# Patient Record
Sex: Female | Born: 1945 | Race: White | Hispanic: No | Smoking: Current every day smoker
Health system: Southern US, Community
[De-identification: ages and names within clinical notes are randomized; demographics above are authoritative.]

## PROBLEM LIST (undated history)

## (undated) DIAGNOSIS — F329 Major depressive disorder, single episode, unspecified: Secondary | ICD-10-CM

## (undated) DIAGNOSIS — R51 Headache: Secondary | ICD-10-CM

## (undated) DIAGNOSIS — J449 Chronic obstructive pulmonary disease, unspecified: Secondary | ICD-10-CM

## (undated) DIAGNOSIS — I959 Hypotension, unspecified: Secondary | ICD-10-CM

## (undated) DIAGNOSIS — I219 Acute myocardial infarction, unspecified: Secondary | ICD-10-CM

## (undated) DIAGNOSIS — Z7901 Long term (current) use of anticoagulants: Secondary | ICD-10-CM

## (undated) DIAGNOSIS — D649 Anemia, unspecified: Secondary | ICD-10-CM

## (undated) DIAGNOSIS — I4891 Unspecified atrial fibrillation: Principal | ICD-10-CM

## (undated) DIAGNOSIS — R519 Headache, unspecified: Secondary | ICD-10-CM

## (undated) DIAGNOSIS — M1990 Unspecified osteoarthritis, unspecified site: Secondary | ICD-10-CM

## (undated) DIAGNOSIS — N179 Acute kidney failure, unspecified: Secondary | ICD-10-CM

## (undated) DIAGNOSIS — I509 Heart failure, unspecified: Secondary | ICD-10-CM

## (undated) DIAGNOSIS — I5032 Chronic diastolic (congestive) heart failure: Secondary | ICD-10-CM

## (undated) DIAGNOSIS — E785 Hyperlipidemia, unspecified: Secondary | ICD-10-CM

## (undated) DIAGNOSIS — F32A Depression, unspecified: Secondary | ICD-10-CM

## (undated) DIAGNOSIS — G8929 Other chronic pain: Secondary | ICD-10-CM

## (undated) DIAGNOSIS — I10 Essential (primary) hypertension: Secondary | ICD-10-CM

## (undated) HISTORY — DX: Other chronic pain: G89.29

## (undated) HISTORY — PX: OTHER SURGICAL HISTORY: SHX169

## (undated) HISTORY — DX: Acute myocardial infarction, unspecified: I21.9

## (undated) HISTORY — DX: Essential (primary) hypertension: I10

## (undated) HISTORY — DX: Hyperlipidemia, unspecified: E78.5

## (undated) HISTORY — DX: Anemia, unspecified: D64.9

## (undated) HISTORY — DX: Chronic obstructive pulmonary disease, unspecified: J44.9

## (undated) HISTORY — DX: Depression, unspecified: F32.A

## (undated) HISTORY — PX: TOTAL KNEE ARTHROPLASTY: SHX125

## (undated) HISTORY — DX: Headache, unspecified: R51.9

## (undated) HISTORY — PX: JOINT REPLACEMENT: SHX530

## (undated) HISTORY — DX: Unspecified atrial fibrillation: I48.91

## (undated) HISTORY — DX: Unspecified osteoarthritis, unspecified site: M19.90

## (undated) HISTORY — PX: TONSILLECTOMY: SHX5217

## (undated) HISTORY — DX: Headache: R51

## (undated) HISTORY — DX: Major depressive disorder, single episode, unspecified: F32.9

---

## 1997-10-18 ENCOUNTER — Other Ambulatory Visit: Admission: RE | Admit: 1997-10-18 | Discharge: 1997-10-18 | Payer: Self-pay | Admitting: *Deleted

## 1997-12-04 ENCOUNTER — Ambulatory Visit (HOSPITAL_COMMUNITY): Admission: RE | Admit: 1997-12-04 | Discharge: 1997-12-04 | Payer: Self-pay | Admitting: *Deleted

## 1999-11-20 ENCOUNTER — Encounter: Payer: Self-pay | Admitting: Internal Medicine

## 1999-11-20 ENCOUNTER — Ambulatory Visit (HOSPITAL_COMMUNITY): Admission: RE | Admit: 1999-11-20 | Discharge: 1999-11-20 | Payer: Self-pay | Admitting: Internal Medicine

## 1999-11-24 ENCOUNTER — Other Ambulatory Visit: Admission: RE | Admit: 1999-11-24 | Discharge: 1999-11-24 | Payer: Self-pay | Admitting: *Deleted

## 2000-02-20 ENCOUNTER — Ambulatory Visit (HOSPITAL_COMMUNITY): Admission: RE | Admit: 2000-02-20 | Discharge: 2000-02-20 | Payer: Self-pay | Admitting: Gastroenterology

## 2000-02-20 ENCOUNTER — Encounter (INDEPENDENT_AMBULATORY_CARE_PROVIDER_SITE_OTHER): Payer: Self-pay

## 2001-12-29 ENCOUNTER — Encounter: Admission: RE | Admit: 2001-12-29 | Discharge: 2001-12-29 | Payer: Self-pay | Admitting: Internal Medicine

## 2001-12-29 ENCOUNTER — Encounter: Payer: Self-pay | Admitting: Internal Medicine

## 2003-07-07 ENCOUNTER — Encounter: Admission: RE | Admit: 2003-07-07 | Discharge: 2003-07-07 | Payer: Self-pay | Admitting: Internal Medicine

## 2003-08-13 ENCOUNTER — Encounter (INDEPENDENT_AMBULATORY_CARE_PROVIDER_SITE_OTHER): Payer: Self-pay | Admitting: Specialist

## 2003-08-13 ENCOUNTER — Ambulatory Visit (HOSPITAL_COMMUNITY): Admission: RE | Admit: 2003-08-13 | Discharge: 2003-08-13 | Payer: Self-pay | Admitting: Gastroenterology

## 2004-01-29 ENCOUNTER — Other Ambulatory Visit: Admission: RE | Admit: 2004-01-29 | Discharge: 2004-01-29 | Payer: Self-pay | Admitting: Internal Medicine

## 2004-05-01 ENCOUNTER — Inpatient Hospital Stay (HOSPITAL_COMMUNITY): Admission: RE | Admit: 2004-05-01 | Discharge: 2004-05-06 | Payer: Self-pay | Admitting: Specialist

## 2004-07-18 ENCOUNTER — Inpatient Hospital Stay (HOSPITAL_COMMUNITY): Admission: RE | Admit: 2004-07-18 | Discharge: 2004-07-22 | Payer: Self-pay | Admitting: Specialist

## 2008-10-18 ENCOUNTER — Emergency Department (HOSPITAL_COMMUNITY): Admission: EM | Admit: 2008-10-18 | Discharge: 2008-10-18 | Payer: Self-pay | Admitting: Emergency Medicine

## 2008-11-16 ENCOUNTER — Other Ambulatory Visit: Admission: RE | Admit: 2008-11-16 | Discharge: 2008-11-16 | Payer: Self-pay | Admitting: Pediatrics

## 2008-11-26 ENCOUNTER — Encounter: Payer: Self-pay | Admitting: Critical Care Medicine

## 2008-12-05 ENCOUNTER — Encounter: Payer: Self-pay | Admitting: Critical Care Medicine

## 2008-12-10 ENCOUNTER — Inpatient Hospital Stay (HOSPITAL_BASED_OUTPATIENT_CLINIC_OR_DEPARTMENT_OTHER): Admission: RE | Admit: 2008-12-10 | Discharge: 2008-12-10 | Payer: Self-pay | Admitting: Cardiology

## 2008-12-13 ENCOUNTER — Ambulatory Visit: Payer: Self-pay | Admitting: Cardiothoracic Surgery

## 2008-12-13 DIAGNOSIS — F329 Major depressive disorder, single episode, unspecified: Secondary | ICD-10-CM

## 2008-12-13 DIAGNOSIS — J441 Chronic obstructive pulmonary disease with (acute) exacerbation: Secondary | ICD-10-CM | POA: Insufficient documentation

## 2008-12-13 DIAGNOSIS — F32A Depression, unspecified: Secondary | ICD-10-CM | POA: Insufficient documentation

## 2008-12-13 DIAGNOSIS — I110 Hypertensive heart disease with heart failure: Secondary | ICD-10-CM | POA: Insufficient documentation

## 2008-12-13 DIAGNOSIS — M129 Arthropathy, unspecified: Secondary | ICD-10-CM

## 2008-12-13 HISTORY — DX: Arthropathy, unspecified: M12.9

## 2008-12-14 ENCOUNTER — Ambulatory Visit: Payer: Self-pay | Admitting: Critical Care Medicine

## 2008-12-14 DIAGNOSIS — J45909 Unspecified asthma, uncomplicated: Secondary | ICD-10-CM | POA: Insufficient documentation

## 2008-12-14 DIAGNOSIS — E785 Hyperlipidemia, unspecified: Secondary | ICD-10-CM | POA: Insufficient documentation

## 2008-12-14 DIAGNOSIS — R51 Headache: Secondary | ICD-10-CM | POA: Insufficient documentation

## 2008-12-14 DIAGNOSIS — J42 Unspecified chronic bronchitis: Secondary | ICD-10-CM | POA: Insufficient documentation

## 2008-12-14 DIAGNOSIS — I219 Acute myocardial infarction, unspecified: Secondary | ICD-10-CM | POA: Insufficient documentation

## 2008-12-14 DIAGNOSIS — R519 Headache, unspecified: Secondary | ICD-10-CM | POA: Insufficient documentation

## 2008-12-15 DIAGNOSIS — L419 Parapsoriasis, unspecified: Secondary | ICD-10-CM | POA: Insufficient documentation

## 2008-12-21 ENCOUNTER — Ambulatory Visit: Payer: Self-pay | Admitting: Critical Care Medicine

## 2008-12-24 ENCOUNTER — Telehealth: Payer: Self-pay | Admitting: Critical Care Medicine

## 2008-12-28 ENCOUNTER — Encounter: Payer: Self-pay | Admitting: Cardiothoracic Surgery

## 2008-12-28 ENCOUNTER — Ambulatory Visit: Payer: Self-pay | Admitting: Surgery

## 2008-12-28 ENCOUNTER — Telehealth: Payer: Self-pay | Admitting: Critical Care Medicine

## 2009-01-01 ENCOUNTER — Ambulatory Visit: Payer: Self-pay | Admitting: Critical Care Medicine

## 2009-01-01 ENCOUNTER — Ambulatory Visit: Payer: Self-pay | Admitting: Cardiothoracic Surgery

## 2009-01-01 ENCOUNTER — Inpatient Hospital Stay (HOSPITAL_COMMUNITY): Admission: RE | Admit: 2009-01-01 | Discharge: 2009-01-09 | Payer: Self-pay | Admitting: Cardiothoracic Surgery

## 2009-01-17 ENCOUNTER — Encounter: Payer: Self-pay | Admitting: Critical Care Medicine

## 2009-01-25 ENCOUNTER — Encounter: Payer: Self-pay | Admitting: Critical Care Medicine

## 2009-01-28 ENCOUNTER — Ambulatory Visit: Payer: Self-pay | Admitting: Critical Care Medicine

## 2009-01-28 ENCOUNTER — Encounter: Admission: RE | Admit: 2009-01-28 | Discharge: 2009-01-28 | Payer: Self-pay | Admitting: Cardiothoracic Surgery

## 2009-01-28 ENCOUNTER — Ambulatory Visit: Payer: Self-pay | Admitting: Cardiothoracic Surgery

## 2009-01-31 ENCOUNTER — Encounter: Payer: Self-pay | Admitting: Critical Care Medicine

## 2009-02-05 ENCOUNTER — Telehealth: Payer: Self-pay | Admitting: Critical Care Medicine

## 2009-02-06 ENCOUNTER — Telehealth: Payer: Self-pay | Admitting: Critical Care Medicine

## 2009-02-07 ENCOUNTER — Encounter: Payer: Self-pay | Admitting: Critical Care Medicine

## 2009-02-07 ENCOUNTER — Ambulatory Visit: Payer: Self-pay | Admitting: Thoracic Surgery (Cardiothoracic Vascular Surgery)

## 2009-02-11 ENCOUNTER — Telehealth: Payer: Self-pay | Admitting: Critical Care Medicine

## 2009-03-02 HISTORY — PX: CORONARY ARTERY BYPASS GRAFT: SHX141

## 2009-03-21 ENCOUNTER — Encounter (HOSPITAL_COMMUNITY): Admission: RE | Admit: 2009-03-21 | Discharge: 2009-06-19 | Payer: Self-pay | Admitting: Cardiology

## 2009-04-02 ENCOUNTER — Ambulatory Visit: Payer: Self-pay | Admitting: Critical Care Medicine

## 2009-04-02 DIAGNOSIS — F17200 Nicotine dependence, unspecified, uncomplicated: Secondary | ICD-10-CM | POA: Insufficient documentation

## 2009-04-04 ENCOUNTER — Encounter: Payer: Self-pay | Admitting: Critical Care Medicine

## 2009-04-18 ENCOUNTER — Encounter: Payer: Self-pay | Admitting: Critical Care Medicine

## 2009-04-22 ENCOUNTER — Telehealth: Payer: Self-pay | Admitting: Critical Care Medicine

## 2009-06-07 ENCOUNTER — Encounter: Payer: Self-pay | Admitting: Critical Care Medicine

## 2009-11-27 ENCOUNTER — Inpatient Hospital Stay (HOSPITAL_COMMUNITY): Admission: EM | Admit: 2009-11-27 | Discharge: 2009-12-01 | Payer: Self-pay | Admitting: Emergency Medicine

## 2009-11-28 ENCOUNTER — Encounter (INDEPENDENT_AMBULATORY_CARE_PROVIDER_SITE_OTHER): Payer: Self-pay | Admitting: Emergency Medicine

## 2009-11-30 DIAGNOSIS — I4891 Unspecified atrial fibrillation: Secondary | ICD-10-CM

## 2009-11-30 HISTORY — DX: Unspecified atrial fibrillation: I48.91

## 2010-04-03 NOTE — Miscellaneous (Signed)
Summary: [facility] Plan/[facility]  Care Plan/[facility]   Imported By: Lester [place] 02/04/2009 07:41:12  _____________________________________________________________________  External Attachment:    Type:   Image     Comment:   External Document

## 2010-04-03 NOTE — Medication Information (Signed)
Summary: [facility]   Imported By: Lester [place] 04/29/2009 08:45:38  _____________________________________________________________________  External Attachment:    Type:   Image     Comment:   External Document

## 2010-04-03 NOTE — Miscellaneous (Signed)
Summary: [facility]  [facility]   Imported By: Sherian Rein 04/05/2009 09:11:11  _____________________________________________________________________  External Attachment:    Type:   Image     Comment:   External Document

## 2010-04-03 NOTE — Progress Notes (Signed)
Summary: Cleared for CABG  ---- Converted from flag ---- ---- 12/22/2008 6:17 AM, Storm Frisk MD wrote: pls call Dr Ysidro Evert office [phone] and tell them the pt is cleared for CABG at any time.  pw ------------------------------  Phone Note Outgoing Call   Call placed by: Gweneth Dimitri RN,  December 24, 2008 9:57 AM Call placed to: Dr. Tyrone Sage Summary of Call: called Dr. Dennie Maizes office.  Spoke with Sherre Poot, Dr. Dennie Maizes nurse.  informed her to please inform Dr. Tyrone Sage that pt is cleared for CABG at any time per PW.  She verbalized understanding and stated she would get message to Dr. Rayburn Ma.   Initial call taken by: Gweneth Dimitri RN,  December 24, 2008 9:59 AM

## 2010-04-03 NOTE — Progress Notes (Signed)
Summary: fyi  Phone Note Call from Patient Call back at [phone]   Caller: Patient Call For: Jennett Tarbell Summary of Call: pt having by pass surgery on tuesday Initial call taken by: Rickard Patience,  December 28, 2008 1:56 PM  Follow-up for Phone Call        noted   Follow-up by: Storm Frisk MD,  December 28, 2008 2:55 PM

## 2010-04-03 NOTE — Assessment & Plan Note (Signed)
Summary: Pulmonary Consultation    Copy to:  Dr. Sheliah Plane Primary Provider/Referring Provider:  Dr. Benjamine Mola  CC:  Pulmonary Consult.  c/o cough with white mucus x20yrs.   and COPD initial evaluation.  History of Present Illness: Pulmonary Consultatio      This is a 65 year old woman who presents for COPD initial evaluation.  The patient complains of history of diagnosed COPD, shortness of breath, wheezing, cough, mucous production, and exercise induced symptoms, but denies chest tightness, chest pain worse with breathing and coughing, nocturnal awakening, and congestion.  Prior evaluation and testing has included Cxr.  The dyspnea is described as with ADL's, with slow ADL's, with walking within room, and with walking one or two blocks.  Associated disease(s) include(s) coronary artery disease, indigestion, hoarseness, anxiety, productive cough, non-productive cough, chest pain, and chest tightness.  Oxygen evaluation is described as not on supplemental O2.  Prior effective treatment has included short acting beta agonists and oral corticosteroids.     This pt is referred for preop eval for CABG.  Pt with lifelong bronchitis.  Active smoking 1PPD x 4yrs but did stop smoking this week.  Had PNA spring 2010.  Noting more dyspnea with smoking worse over two years.  Ave bronchitis once yearly.  If gets upset will cough.  Was on advair but ran out as had no insurance coverage for same.  Aves twice a day on proventil hfa.   No recent prednisone but this helps.  Zpak helps.  Now mucous is white.  Notes some wheezing.   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  Pt with ongoing bleeding from the lesions.  Pt with appt with [facility] 10/15 for eval.  Preventive Screening-Counseling & Management  Alcohol-Tobacco     Smoking Status: current     Packs/Day: 1.0     Pack years: 45  Current Medications (verified): 1)  Metoprolol Tartrate 50 Mg Tabs  (Metoprolol Tartrate) .... Once Daily 2)  Aspirin 81 Mg Tbec (Aspirin) .... Once Daily 3)  Alprazolam 0.25 Mg Tabs (Alprazolam) .Marland Kitchen.. 1 Tab Every 8 Hours As Needed 4)  Simvastatin 40 Mg Tabs (Simvastatin) .... Once Daily 5)  Paroxetine Hcl 20 Mg Tabs (Paroxetine Hcl) .... Two Times A Day 6)  Lisinopril-Hydrochlorothiazide 10-12.5 Mg Tabs (Lisinopril-Hydrochlorothiazide) .... Two Times A Day 7)  Proventil Hfa 108 (90 Base) Mcg/act Aers (Albuterol Sulfate) .Marland Kitchen.. 1-2 Puffs Every 4 Hours As Needed  Allergies: 1)  ! Pcn 2)  ! * Novacaine  Past History:  Past medical, surgical, family and social histories (including risk factors) reviewed, and no changes noted (except as noted below).  Past Medical History: Current Problems:  HEADACHE, CHRONIC (ICD-784.0) HYPERLIPIDEMIA (ICD-272.4) ASTHMA (ICD-493.90) BRONCHITIS, CHRONIC (ICD-491.9) HEART ATTACK (ICD-410.90) ARTHRITIS (ICD-716.90) HYPERTENSION (ICD-401.9) DEPRESSION (ICD-311) C O P D (ICD-496)  Past Surgical History: C-section x2 Total Knee Arthroplasty  Family History: Reviewed history and no changes required. heart disease-father cancer-lung ca MGF and PGF  Social History: Reviewed history and no changes required. Patient is a current smoker.   1ppd x 29yrs 2 children Retired from Clinical biochemist at [employer] Smoking Status:  current Packs/Day:  1.0 Pack years:  45  Review of Systems       The patient complains of shortness of breath with activity, shortness of breath at rest, productive cough, chest pain, irregular heartbeats, acid heartburn, indigestion, loss of appetite, weight change, difficulty swallowing, headaches, nasal congestion/difficulty breathing through  nose, sneezing, itching, anxiety, depression, joint stiffness or pain, and rash.  The patient denies non-productive cough, coughing up blood, sore throat, tooth/dental problems, ear ache, hand/feet swelling, change in color of mucus, and fever.        See HPI for  Pulmonary, Cardiac, General, and ENT review of systems.  Vital Signs:  Patient profile:   65 year old female Height:      63 inches Weight:      207.50 pounds BMI:     36.89 O2 Sat:      92 % on Room air Temp:     97.9 degrees F oral Pulse rate:   55 / minute BP sitting:   110 / 68  (left arm) Cuff size:   regular  Vitals Entered By: Gweneth Dimitri RN (December 14, 2008 1:38 PM)  O2 Flow:  Room air CC: Pulmonary Consult.  c/o cough with white mucus x22yrs.  , COPD initial evaluation Comments Medications reviewed with patient Gweneth Dimitri RN  December 14, 2008 1:39 PM    Physical Exam  Additional Exam:  Gen: Pleasant, well-nourished, in no distress,  normal affect ENT: No lesions,  mouth clear,  oropharynx clear, no postnasal drip Neck: No JVD, no TMG, no carotid bruits Lungs: No use of accessory muscles, no dullness to percussion, distant BS, exp wheezes. poor airflow Cardiovascular: RRR, heart sounds normal, no murmur or gallops, no peripheral edema Abdomen: soft and NT, no HSM,  BS normal Musculoskeletal: No deformities, no cyanosis or clubbing Neuro: alert, non focal Skin: Warm, innumberable raised psorarisis type rash on arms and legs, many are bleeding due to constant scratching per pt   CXR  Procedure date:  12/13/2008  Findings:      COPD changes   Pulmonary Function Test Date: 12/14/2008 2:00 PM Gender: Female  Pre-Spirometry FVC    Value: 1.56 L/min   % Pred: 51.30 % FEV1    Value: 1.27 L     Pred: 2.32 L     % Pred: 54.60 % FEV1/FVC  Value: 81.24 %     % Pred: 105.50 %  Impression & Recommendations:  Problem # 1:  C O P D (ICD-496) Assessment Unchanged Severe Copd with obstruction on spirometry in moderate to severe degree.  Pt is not yet ready for CABG if can be postponed. Also skin lesions need further treatment to heal up prior to surgery plan Depomedrol 120mg  IM will be given Avelox one daily for 5 days Pulse prednisone  Start Symbicort two  puff twice daily Start Spiriva Return one week Would hold off on surgery until return visit and clearance>>delay surgery by at least two weeks if possible to allow airways and skin to heal first to lower surgical risk  Problem # 2:  PARAPSORIASIS (ICD-696.2) Assessment: Unchanged Psoriasis type rash over legs and arms ?etiology plan case discussed with Donzetta Starch today of Derm  He will see the patient and advise proper rx systemic steroids for lungs will help  Medications Added to Medication List This Visit: 1)  Metoprolol Tartrate 50 Mg Tabs (Metoprolol tartrate) .... Once daily 2)  Aspirin 81 Mg Tbec (Aspirin) .... Once daily 3)  Alprazolam 0.25 Mg Tabs (Alprazolam) .Marland Kitchen.. 1 tab every 8 hours as needed 4)  Simvastatin 40 Mg Tabs (Simvastatin) .... Once daily 5)  Paroxetine Hcl 20 Mg Tabs (Paroxetine hcl) .... Two times a day 6)  Lisinopril-hydrochlorothiazide 10-12.5 Mg Tabs (Lisinopril-hydrochlorothiazide) .... Two times a day 7)  Proventil Hfa 108 (90  Base) Mcg/act Aers (Albuterol sulfate) .Marland Kitchen.. 1-2 puffs every 4 hours as needed 8)  Spiriva Handihaler 18 Mcg Caps (Tiotropium bromide monohydrate) .... Two puffs in handihaler daily 9)  Symbicort 160-4.5 Mcg/act Aero (Budesonide-formoterol fumarate) .... Two puffs twice daily 10)  Prednisone 10 Mg Tabs (Prednisone) .... Take as directed 4 each am x3days, 3 x 3days, 2 x 3days, 1 x 3days then stop 11)  Avelox 400 Mg Tabs (Moxifloxacin hcl) .... By mouth daily  Complete Medication List: 1)  Metoprolol Tartrate 50 Mg Tabs (Metoprolol tartrate) .... Once daily 2)  Aspirin 81 Mg Tbec (Aspirin) .... Once daily 3)  Alprazolam 0.25 Mg Tabs (Alprazolam) .Marland Kitchen.. 1 tab every 8 hours as needed 4)  Simvastatin 40 Mg Tabs (Simvastatin) .... Once daily 5)  Paroxetine Hcl 20 Mg Tabs (Paroxetine hcl) .... Two times a day 6)  Lisinopril-hydrochlorothiazide 10-12.5 Mg Tabs (Lisinopril-hydrochlorothiazide) .... Two times a day 7)  Proventil Hfa 108 (90 Base)  Mcg/act Aers (Albuterol sulfate) .Marland Kitchen.. 1-2 puffs every 4 hours as needed 8)  Spiriva Handihaler 18 Mcg Caps (Tiotropium bromide monohydrate) .... Two puffs in handihaler daily 9)  Symbicort 160-4.5 Mcg/act Aero (Budesonide-formoterol fumarate) .... Two puffs twice daily 10)  Prednisone 10 Mg Tabs (Prednisone) .... Take as directed 4 each am x3days, 3 x 3days, 2 x 3days, 1 x 3days then stop 11)  Avelox 400 Mg Tabs (Moxifloxacin hcl) .... By mouth daily  Other Orders: Spirometry w/Graph (94010) New Patient Level V (29562) Admin of Therapeutic Inj  intramuscular or subcutaneous (13086) Depo- Medrol 40mg  (J1030) Depo- Medrol 80mg  (J1040)  Patient Instructions: 1)  Depomedrol 120mg  IM will be given 2)  Avelox one daily for 5 days 3)  Pulse prednisone  4)  Start Symbicort two puff twice daily 5)  Start Spiriva 6)  Return one week 7)  Would hold off on surgery until return visit and clearance 8)  Keep dermatology visit Prescriptions: PREDNISONE 10 MG  TABS (PREDNISONE) Take as directed 4 each am x3days, 3 x 3days, 2 x 3days, 1 x 3days then stop  #30 x 0   Entered and Authorized by:   Storm Frisk MD   Signed by:   Storm Frisk MD on 12/14/2008   Method used:   Print then Give to Patient   RxID:   [id number] SYMBICORT 160-4.5 MCG/ACT  AERO (BUDESONIDE-FORMOTEROL FUMARATE) Two puffs twice daily  #1 x 6   Entered and Authorized by:   Storm Frisk MD   Signed by:   Storm Frisk MD on 12/14/2008   Method used:   Print then Give to Patient   RxID:   [id number] SPIRIVA HANDIHALER 18 MCG  CAPS (TIOTROPIUM BROMIDE MONOHYDRATE) Two puffs in handihaler daily  #30 x 6   Entered and Authorized by:   Storm Frisk MD   Signed by:   Storm Frisk MD on 12/14/2008   Method used:   Print then Give to Patient   RxID:   [id number] PREDNISONE 10 MG  TABS (PREDNISONE) Take as directed 4 each am x3days, 3 x 3days, 2 x 3days, 1 x 3days then stop  #30 x 0   Entered and  Authorized by:   Storm Frisk MD   Signed by:   Storm Frisk MD on 12/14/2008   Method used:   Print then Give to Patient   RxID:   [id number] SYMBICORT 160-4.5 MCG/ACT  AERO (BUDESONIDE-FORMOTEROL FUMARATE) Two puffs twice daily  #1 x 6  Entered and Authorized by:   Storm Frisk MD   Signed by:   Storm Frisk MD on 12/14/2008   Method used:   Print then Give to Patient   RxID:   [id number] SPIRIVA HANDIHALER 18 MCG  CAPS (TIOTROPIUM BROMIDE MONOHYDRATE) Two puffs in handihaler daily  #30 x 6   Entered and Authorized by:   Storm Frisk MD   Signed by:   Storm Frisk MD on 12/14/2008   Method used:   Print then Give to Patient   RxID:   [id number]    CardioPerfect Spirometry  ID: [id number] Patient: Leonard Schwartz DOB: 01-28-1946 Age: 65 Years Old Sex: Female Race: White Physician: Deontae Robson Height: 63 Weight: 207.50 Smoker: Yes PPD: 1.0 Status: Confirmed Past Medical History:  Current Problems:  ARTHRITIS (ICD-716.90) HYPERTENSION (ICD-401.9) DEPRESSION (ICD-311) 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 P D (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)  Recorded: 12/14/2008 2:00 PM  Parameter  Measured Predicted %Predicted FVC     1.56        3.04        51.30 FEV1     1.27        2.32        54.60 FEV1%   81.24        77.00        105.50 PEF    3.02        5.84        51.70   Comments: Moderate airflow obstruction   Interpretation: Pre: FVC= 1.56L FEV1= 1.27L FEV1%= 81.2% 1.27/1.56 FEV1/FVC (12/14/2008 2:01:53 PM), Moderately severe restriction     Medication Administration  Injection # 1:    Medication: Depo- Medrol 80mg     Diagnosis: BRONCHITIS, CHRONIC (ICD-491.9)    Route: IM    Site: R deltoid    Exp Date: 12/20/2010    Lot #: [id number]    Mfr: [employer]    Patient tolerated injection without complications    Given by: Clarise Cruz (AAMA) (December 14, 2008 3:15 PM)  Injection # 2:    Medication: Depo- Medrol 40mg     Diagnosis: BRONCHITIS, CHRONIC (ICD-491.9)    Route:  IM    Site: R deltoid    Exp Date: 12/20/2010    Lot #: [id number]    Mfr: [employer]    Patient tolerated injection without complications    Given by: Clarise Cruz Duncan Dull) (December 14, 2008 3:17 PM)  Orders Added: 1)  Spirometry w/Graph [94010] 2)  New Patient Level V [99205] 3)  Admin of Therapeutic Inj  intramuscular or subcutaneous [96372] 4)  Depo- Medrol 40mg  [J1030] 5)  Depo- Medrol 80mg  [J1040]  Appended Document: Pulmonary Consultation  fax Donato Schultz, Saralyn Pilar,

## 2010-04-03 NOTE — Progress Notes (Signed)
Summary: order request  Phone Note Call from Patient   Caller: Patient Call For: Ellouise Mcwhirter Summary of Call: needs [facility] to d/c oxygen. says dr Delford Field told pt she didn't need this any more. pt [phone] Initial call taken by: Tivis Ringer,  February 11, 2009 9:52 AM  Follow-up for Phone Call        order has been placed by PW and faxed to [facility]. Pt states [facility] says they have not received fax. I have refaxed the order. Pt  aware.Carron Curie CMA  February 11, 2009 9:56 AM

## 2010-04-03 NOTE — Letter (Signed)
Summary: CMN for walker w/ wheels/[facility]  CMN for walker w/ wheels/[facility]   Imported By: Sherian Rein 01/29/2009 14:21:04  _____________________________________________________________________  External Attachment:    Type:   Image     Comment:   External Document

## 2010-04-03 NOTE — Miscellaneous (Signed)
Summary: PT Orders/[facility]  PT Orders/[facility]   Imported By: Lanelle Bal 06/14/2009 10:54:09  _____________________________________________________________________  External Attachment:    Type:   Image     Comment:   External Document

## 2010-04-03 NOTE — Miscellaneous (Signed)
Summary: Orders Update  Clinical Lists Changes  Orders: Added new Referral order of DME Referral (DME) - Signed 

## 2010-04-03 NOTE — Assessment & Plan Note (Signed)
Summary: Pulmonary OV   Copy to:  Dr. Sheliah Plane Primary Provider/Referring Provider:  Dr. Benjamine Mola  CC:  1 wk follow up.  pt states breathing is a lot better and no longer having coughing spells.  pt started spiriva/symbicort-they are helping.  Marland Kitchen  History of Present Illness: Pulmonary OV    12/14/08:   This is a 65 year old woman who presents for COPD initial evaluation.   This pt is referred for preop eval for CABG.  Pt with lifelong bronchitis.  Active smoking 1PPD x 77yrs but did stop smoking this week.  Had PNA spring 2010.  Noting more dyspnea with smoking worse over two years.  Ave bronchitis once yearly.  If gets upset will cough.  Was on advair but ran out as had no insurance coverage for same.  Aves twice a day on proventil hfa.   No recent prednisone but this helps.  Zpak helps.  Now mucous is white.  Notes some wheezing.   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  Pt with ongoing bleeding from the lesions.  Pt with appt with [facility] 10/15 for eval.  December 21, 2008 10:35 AM The pt is doing better.  Her skin dx is psoriasis.   The pt is on steroids and pulm ABX covered skin infection.  She is less dyspneic.  There is less cough.  There is no cp.  She is not smoking. Pt denies any significant sore throat, nasal congestion or excess secretions, fever, chills, sweats, unintended weight loss, pleurtic or exertional chest pain, orthopnea PND, or leg swelling Pt denies any increase in rescue therapy over baseline, denies waking up needing it or having any early am or nocturnal exacerbations of coughing/wheezing/or dyspnea.   Preventive Screening-Counseling & Management  Alcohol-Tobacco     Smoking Status: quit < 6 months  Current Medications (verified): 1)  Metoprolol Tartrate 50 Mg Tabs (Metoprolol Tartrate) .... Once Daily 2)  Aspirin 81 Mg Tbec (Aspirin) .... Once Daily 3)  Alprazolam 0.25 Mg Tabs (Alprazolam) .Marland Kitchen.. 1 Tab Every 8  Hours As Needed 4)  Simvastatin 40 Mg Tabs (Simvastatin) .... Once Daily 5)  Paroxetine Hcl 20 Mg Tabs (Paroxetine Hcl) .... Two Times A Day 6)  Lisinopril-Hydrochlorothiazide 10-12.5 Mg Tabs (Lisinopril-Hydrochlorothiazide) .... Two Times A Day 7)  Proventil Hfa 108 (90 Base) Mcg/act Aers (Albuterol Sulfate) .Marland Kitchen.. 1-2 Puffs Every 4 Hours As Needed 8)  Spiriva Handihaler 18 Mcg  Caps (Tiotropium Bromide Monohydrate) .... Two Puffs in Handihaler Daily 9)  Symbicort 160-4.5 Mcg/act  Aero (Budesonide-Formoterol Fumarate) .... Two Puffs Twice Daily 10)  Prednisone 10 Mg  Tabs (Prednisone) .... Take As Directed 4 Each Am X3days, 3 X 3days, 2 X 3days, 1 X 3days Then Stop  Allergies (verified): 1)  ! Pcn 2)  ! * Novacaine  Past History:  Past medical, surgical, family and social histories (including risk factors) reviewed, and no changes noted (except as noted below).  Past Medical History: Reviewed history from 12/14/2008 and no changes required. 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 C-section x2 Total Knee Arthroplasty  Family History: Reviewed history from 12/14/2008 and no changes required. heart disease-father cancer-lung ca MGF and PGF  Social History: Reviewed history from 12/14/2008 and no changes required. Patient  is a current smoker.   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  The patient denies shortness of breath at rest, productive cough, non-productive cough, coughing up blood, chest pain, irregular heartbeats, acid heartburn, indigestion, loss  of appetite, weight change, abdominal pain, difficulty swallowing, sore throat, tooth/dental problems, headaches, nasal congestion/difficulty breathing through nose, sneezing, itching, ear ache, anxiety, depression, hand/feet swelling, joint stiffness or pain, rash, change in color of mucus, and fever.    Vital Signs:  Patient profile:   65 year old female Height:      63 inches Weight:      212 pounds BMI:     37.69 O2 Sat:      96 % on Room air Temp:     98.0 degrees F oral Pulse rate:   57 / minute BP sitting:   108 / 80  (left arm) Cuff size:   regular  Vitals Entered By: Gweneth Dimitri RN (December 21, 2008 10:24 AM)  O2 Flow:  Room air CC: 1 wk follow up.  pt states breathing is a lot better and no longer having coughing spells.  pt started spiriva/symbicort-they are helping.   Comments Medications reviewed with patient Gweneth Dimitri RN  December 21, 2008 10:25 AM    Physical Exam  Additional Exam:  Gen: Pleasant, well-nourished, in no distress,  normal affect ENT: No lesions,  mouth clear,  oropharynx clear, no postnasal drip Neck: No JVD, no TMG, no carotid bruits Lungs: No use of accessory muscles, no dullness to percussion, improved airflow Cardiovascular: RRR, heart sounds normal, no murmur or gallops, no peripheral edema Abdomen: soft and NT, no HSM,  BS normal Musculoskeletal: No deformities, no cyanosis or clubbing Neuro: alert, non focal Skin: Warm,  improved psoriatic skin lesions over arms and legs.   Impression & Recommendations:  Problem # 1:  PARAPSORIASIS (EAV-409.8) Assessment Improved Psoriasis improved plan  per Derm  Problem # 2:  C O P D (ICD-496) Assessment: Improved COPD with improved airway inflammation plan taper steroids to off no further ABX  Complete Medication List: 1)  Metoprolol Tartrate 50 Mg Tabs (Metoprolol tartrate) .... Once daily 2)  Aspirin 81 Mg Tbec (Aspirin) .... Once daily 3)  Alprazolam 0.25 Mg Tabs (Alprazolam) .Marland Kitchen.. 1  tab every 8 hours as needed 4)  Simvastatin 40 Mg Tabs (Simvastatin) .... Once daily 5)  Paroxetine Hcl 20 Mg Tabs (Paroxetine hcl) .... Two times a day 6)  Lisinopril-hydrochlorothiazide 10-12.5 Mg Tabs (Lisinopril-hydrochlorothiazide) .... Two times a day 7)  Proventil Hfa 108 (90 Base) Mcg/act Aers (Albuterol sulfate) .Marland Kitchen.. 1-2 puffs every 4 hours as needed 8)  Spiriva Handihaler 18 Mcg Caps (Tiotropium bromide monohydrate) .... Two puffs in handihaler daily 9)  Symbicort 160-4.5 Mcg/act Aero (Budesonide-formoterol fumarate) .... Two puffs twice daily 10)  Prednisone 10 Mg Tabs (Prednisone) .... Take as directed 4 each am x3days, 3 x 3days, 2 x 3days, 1 x 3days then stop  Other Orders: Est. Patient Level III (11914)  Patient Instructions: 1)  Finish prednisone  2)  Stay off cigarettes 3)  You are cleared for surgery 4)  Stay on inhalers and heart meds 5)  Take it easy 6)  Return 3 months after surgery, we will check on you in the hospital post op  Appended Document: Pulmonary OV fax Ofilia Neas

## 2010-04-03 NOTE — Miscellaneous (Signed)
Summary: ONO on RA  Clinical Lists Changes  Observations: Added new observation of SLEEP STUDY: Low Oxygen Sat:  Oximetry overnight on       RA           =   92  (01/31/2009 15:06)      Sleep Study  Procedure date:  01/31/2009  Findings:      Low Oxygen Sat:  Oximetry overnight on       RA           =   92   Appended Document: ONO on RA result noted  patient aware

## 2010-04-03 NOTE — Progress Notes (Signed)
Summary: Insurance denial  Phone Note Outgoing Call   Reason for Call: Get patient information Summary of Call: tell pt insurance denied nicotrol inhaler,  she will have to use OTC nicoderm patch or gum  Initial call taken by: Storm Frisk MD,  April 22, 2009 9:55 AM  Follow-up for Phone Call        [facility] Gweneth Dimitri RN  April 22, 2009 10:13 AM   Additional Follow-up for Phone Call Additional follow up Details #1::        called pt's home number-[id number] .  Gweneth Dimitri RN  April 23, 2009 9:24   called pt's cell number, spoke to her.  Informed her of above statement per PW.  She verbalized understanding and stated she purchased the nicotrol inhaler even though insurance did not pay for it.    Additional Follow-up by: Gweneth Dimitri RN,  April 23, 2009 9:26 AM

## 2010-04-03 NOTE — Letter (Signed)
Summary: CMN Oxygen/[facility]  CMN Oxygen/[facility]   Imported By: Lester Harpster 01/21/2009 08:25:14  _____________________________________________________________________  External Attachment:    Type:   Image     Comment:   External Document

## 2010-04-03 NOTE — Assessment & Plan Note (Signed)
Summary: Pulmonary OV   Copy to:  Dr. Sheliah Plane Primary Provider/Referring Provider:  Dr. Benjamine Mola  CC:  2 mo follow up.  states breathing is better. No complaints. .  History of Present Illness: Pulmonary OV 65 yo WF with COPD s/p CABG for CAD  11/10  January 28, 2009 9:51 AM The pt had CABG 01/01/09.  Now the pt is improving.  There is less cough and dyspnea.   There is no wheezing.  There is no smoking.  The pt is using oxygen 2L at home.  There is no edema in the feet. The cough is improved. Pt denies any increase in rescue therapy over baseline, denies waking up needing it or having any early am or nocturnal exacerbations of coughing/wheezing/or dyspnea. Pt denies any significant sore throat, nasal congestion or excess secretions, fever, chills, sweats, unintended weight loss, pleurtic or exertional chest pain, orthopnea PND, or leg swelling    April 02, 2009 12:14 PM Notes is still dyspneic. Has not yet started rehab.  Has started smoking again.  Has been noncompliant with inhalers, is confused re: meds. Dyspnea may be worse.  No real cough or chest pain.  Preventive Screening-Counseling & Management  Alcohol-Tobacco     Smoking Status: current     Smoking Cessation Counseling: yes     Packs/Day: 0.25  Current Medications (verified): 1)  Metoprolol Tartrate 50 Mg Tabs (Metoprolol Tartrate) .... Once Daily 2)  Aspirin 81 Mg Tbec (Aspirin) .... Once Daily 3)  Alprazolam 0.25 Mg Tabs (Alprazolam) .Marland Kitchen.. 1 Tab Every 8 Hours As Needed 4)  Simvastatin 40 Mg Tabs (Simvastatin) .... Once Daily 5)  Paroxetine Hcl 20 Mg Tabs (Paroxetine Hcl) .... Two Times A Day 6)  Proventil Hfa 108 (90 Base) Mcg/act Aers (Albuterol Sulfate) .Marland Kitchen.. 1-2 Puffs Every 4 Hours As Needed 7)  Spiriva Handihaler 18 Mcg  Caps (Tiotropium Bromide Monohydrate) .... Two Puffs in Handihaler Daily 8)  Hydrocodone-Acetaminophen 5-500 Mg Tabs (Hydrocodone-Acetaminophen) .... As Needed 9)  K-Lor 20 Meq Pack  (Potassium Chloride) .... Once Daily  Allergies (verified): 1)  ! Pcn 2)  ! * Novacaine  Past History:  Past medical, surgical, family and social histories (including risk factors) reviewed, and no changes noted (except as noted below).  Past Medical History: Reviewed history from 12/14/2008 and no changes required. 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 C-section x2 Total Knee Arthroplasty  Family History: Reviewed history from 12/14/2008 and no changes required. heart disease-father cancer-lung ca MGF and PGF  Social History: Reviewed history from 12/14/2008 and no changes required. Patient is a current smoker.   1ppd x 50yrs 2 children Retired from Clinical biochemist at [employer] Smoking Status:  current Packs/Day:  0.25  Review of Systems       The patient complains of shortness of breath with activity, shortness of breath at rest, and non-productive cough.  The patient denies productive cough, coughing up blood, chest pain, irregular heartbeats, acid heartburn, indigestion, loss of appetite, weight change, abdominal pain, difficulty swallowing, sore throat, tooth/dental problems, headaches, nasal congestion/difficulty breathing through nose, sneezing, itching, ear ache, anxiety, depression, hand/feet swelling, joint stiffness or pain, rash, change in color of mucus, and fever.    Vital Signs:  Patient profile:   65 year old female Height:  62 inches Weight:      220.13 pounds BMI:     40.41 O2 Sat:      94 % on Room air Temp:     97.6 degrees F oral Pulse rate:   66 / minute BP sitting:   142 / 86  (right arm) Cuff size:   large  Vitals Entered By: Gweneth Dimitri RN (April 02, 2009 12:08 PM)  O2 Flow:  Room air CC: 2 mo follow up.  states breathing is better. No complaints.  Comments Medications reviewed with patient Daytime contact number verified with patient. 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   Impression & Recommendations:  Problem # 1:  C O P D (ICD-496) Assessment Unchanged  COPD with ongoing tobacco abuse plan stop smoking  start nicotrol inhaler  stay on sprivia stop symbicort resume cardiac rehab refer to NP for medication calendar  Problem # 2:  NICOTINE ADDICTION (ICD-305.1) Assessment: Deteriorated nicotine addiction time spent on counselling nicotrol rx Her updated medication list for this problem includes:    Nicotrol 10 Mg Inha (Nicotine) ..... Use 60-80 puffs per cartridge  use 6-8 cartridges per day  Orders: Tobacco use cessation intensive >10 minutes (08657)  Medications Added to Medication List This Visit: 1)  Nicotrol 10 Mg Inha (Nicotine) .... Use 60-80 puffs per cartridge  use 6-8 cartridges per day  Complete Medication List: 1)  Metoprolol Tartrate 50 Mg Tabs (Metoprolol tartrate) .... Once daily 2)  Aspirin 81 Mg Tbec (Aspirin) .... Once daily 3)  Alprazolam 0.25 Mg Tabs (Alprazolam) .Marland Kitchen.. 1 tab every 8 hours as needed 4)  Simvastatin 40 Mg Tabs (Simvastatin) .... Once daily 5)  Paroxetine Hcl 20 Mg Tabs (Paroxetine hcl) .... Two times a day 6)  Proventil Hfa  108 (90 Base) Mcg/act Aers (Albuterol sulfate) .Marland Kitchen.. 1-2 puffs every 4 hours as needed 7)  Spiriva Handihaler 18 Mcg Caps (Tiotropium bromide monohydrate) .... Two puffs in handihaler daily 8)  Hydrocodone-acetaminophen 5-500 Mg Tabs (Hydrocodone-acetaminophen) .... As needed 9)  K-lor 20 Meq Pack (Potassium chloride) .... Once daily 10)  Nicotrol 10 Mg Inha (Nicotine) .... Use 60-80 puffs per cartridge  use 6-8 cartridges per day  Other Orders: Est. Patient Level III (84696)  Patient Instructions: 1)  Refer to Tammy Parrett for medication calendar 2)  Stop smoking!! 3)  Stop Symbicort 4)  Stay on Spiriva daily 5)  Proventil as needed  6)  Return 3 months 7)  Use Nicotrol to stop smoking Prescriptions: NICOTROL 10 MG INHA (NICOTINE) Use 60-80 puffs per cartridge  use 6-8 cartridges per day  #one month x 2   Entered and Authorized by:   Storm Frisk MD   Signed by:   Storm Frisk MD on 04/02/2009   Method used:   Print then Give to Patient   RxID:   [id number]    Immunization History:  Influenza Immunization History:  Influenza:  historical (11/30/2008)  Pneumovax Immunization History:    Pneumovax:  historical (11/30/2008)   Appended Document: Pulmonary OV fax Dr Benjamine Mola

## 2010-04-03 NOTE — Medication Information (Signed)
Summary: Denied/[facility]  Denied/[facility]   Imported By: Lester Lucien 04/29/2009 08:48:57  _____________________________________________________________________  External Attachment:    Type:   Image     Comment:   External Document

## 2010-04-03 NOTE — Progress Notes (Signed)
Summary: fyi  Phone Note Call from Patient Call back at [phone]   Caller: Patient Call For: Eliza Green Summary of Call: primary dr's name is loretta vanhaafteren with [facility] [place]  [phone] Initial call taken by: Rickard Patience,  February 06, 2009 1:40 PM  Follow-up for Phone Call        Sent to PW. Reynaldo Minium CMA  February 06, 2009 2:33 PM   Additional Follow-up for Phone Call Additional follow up Details #1::        Noted  pw Additional Follow-up by: Storm Frisk MD,  February 07, 2009 8:47 AM

## 2010-05-06 ENCOUNTER — Emergency Department (HOSPITAL_COMMUNITY)
Admission: EM | Admit: 2010-05-06 | Discharge: 2010-05-06 | Disposition: A | Discharge status: Home / Self Care | Payer: Medicare Other | Attending: Emergency Medicine | Admitting: Emergency Medicine

## 2010-05-06 ENCOUNTER — Emergency Department (HOSPITAL_COMMUNITY): Payer: Medicare Other

## 2010-05-06 DIAGNOSIS — M549 Dorsalgia, unspecified: Secondary | ICD-10-CM | POA: Insufficient documentation

## 2010-05-06 DIAGNOSIS — M129 Arthropathy, unspecified: Secondary | ICD-10-CM | POA: Insufficient documentation

## 2010-05-06 DIAGNOSIS — Y92009 Unspecified place in unspecified non-institutional (private) residence as the place of occurrence of the external cause: Secondary | ICD-10-CM | POA: Insufficient documentation

## 2010-05-06 DIAGNOSIS — E039 Hypothyroidism, unspecified: Secondary | ICD-10-CM | POA: Insufficient documentation

## 2010-05-06 DIAGNOSIS — S0083XA Contusion of other part of head, initial encounter: Secondary | ICD-10-CM | POA: Insufficient documentation

## 2010-05-06 DIAGNOSIS — M1990 Unspecified osteoarthritis, unspecified site: Secondary | ICD-10-CM | POA: Insufficient documentation

## 2010-05-06 DIAGNOSIS — E785 Hyperlipidemia, unspecified: Secondary | ICD-10-CM | POA: Insufficient documentation

## 2010-05-06 DIAGNOSIS — G8929 Other chronic pain: Secondary | ICD-10-CM | POA: Insufficient documentation

## 2010-05-06 DIAGNOSIS — Z7901 Long term (current) use of anticoagulants: Secondary | ICD-10-CM | POA: Insufficient documentation

## 2010-05-06 DIAGNOSIS — M25569 Pain in unspecified knee: Secondary | ICD-10-CM | POA: Insufficient documentation

## 2010-05-06 DIAGNOSIS — S8000XA Contusion of unspecified knee, initial encounter: Secondary | ICD-10-CM | POA: Insufficient documentation

## 2010-05-06 DIAGNOSIS — J449 Chronic obstructive pulmonary disease, unspecified: Secondary | ICD-10-CM | POA: Insufficient documentation

## 2010-05-06 DIAGNOSIS — S1093XA Contusion of unspecified part of neck, initial encounter: Secondary | ICD-10-CM | POA: Insufficient documentation

## 2010-05-06 DIAGNOSIS — I10 Essential (primary) hypertension: Secondary | ICD-10-CM | POA: Insufficient documentation

## 2010-05-06 DIAGNOSIS — Z7982 Long term (current) use of aspirin: Secondary | ICD-10-CM | POA: Insufficient documentation

## 2010-05-06 DIAGNOSIS — R51 Headache: Secondary | ICD-10-CM | POA: Insufficient documentation

## 2010-05-06 DIAGNOSIS — I2510 Atherosclerotic heart disease of native coronary artery without angina pectoris: Secondary | ICD-10-CM | POA: Insufficient documentation

## 2010-05-06 DIAGNOSIS — F3289 Other specified depressive episodes: Secondary | ICD-10-CM | POA: Insufficient documentation

## 2010-05-06 DIAGNOSIS — Z79899 Other long term (current) drug therapy: Secondary | ICD-10-CM | POA: Insufficient documentation

## 2010-05-06 DIAGNOSIS — W010XXA Fall on same level from slipping, tripping and stumbling without subsequent striking against object, initial encounter: Secondary | ICD-10-CM | POA: Insufficient documentation

## 2010-05-06 DIAGNOSIS — F329 Major depressive disorder, single episode, unspecified: Secondary | ICD-10-CM | POA: Insufficient documentation

## 2010-05-06 DIAGNOSIS — S0003XA Contusion of scalp, initial encounter: Secondary | ICD-10-CM | POA: Insufficient documentation

## 2010-05-06 DIAGNOSIS — J4489 Other specified chronic obstructive pulmonary disease: Secondary | ICD-10-CM | POA: Insufficient documentation

## 2010-05-06 LAB — PROTIME-INR
INR: 3.74 — ABNORMAL HIGH (ref 0.00–1.49)
Prothrombin Time: 37 seconds — ABNORMAL HIGH (ref 11.6–15.2)

## 2010-05-15 LAB — COMPREHENSIVE METABOLIC PANEL
ALT: 11 U/L (ref 0–35)
AST: 24 U/L (ref 0–37)
Albumin: 3.8 g/dL (ref 3.5–5.2)
Alkaline Phosphatase: 51 U/L (ref 39–117)
BUN: 9 mg/dL (ref 6–23)
CO2: 29 mEq/L (ref 19–32)
Calcium: 9.6 mg/dL (ref 8.4–10.5)
Chloride: 102 mEq/L (ref 96–112)
Creatinine, Ser: 1.06 mg/dL (ref 0.4–1.2)
GFR calc Af Amer: >60 mL/min (ref >60)
GFR calc non Af Amer: 52 mL/min — ABNORMAL LOW (ref >60)
Glucose, Bld: 131 mg/dL — ABNORMAL HIGH (ref 70–99)
Potassium: 3.9 mEq/L (ref 3.5–5.1)
Sodium: 139 mEq/L (ref 135–145)
Total Bilirubin: 1.1 mg/dL (ref 0.3–1.2)
Total Protein: 6.8 g/dL (ref 6.0–8.3)

## 2010-05-15 LAB — LIPID PANEL
Cholesterol: 112 mg/dL (ref 0–200)
HDL: 52 mg/dL (ref >39)
LDL Cholesterol: 40 mg/dL (ref 0–99)
Total CHOL/HDL Ratio: 2.2 RATIO
Triglycerides: 99 mg/dL (ref <150)
VLDL: 20 mg/dL (ref 0–40)

## 2010-05-15 LAB — DIFFERENTIAL
Basophils Absolute: 0 10*3/uL (ref 0.0–0.1)
Basophils Relative: 0 % (ref 0–1)
Eosinophils Absolute: 0.1 10*3/uL (ref 0.0–0.7)
Eosinophils Relative: 1 % (ref 0–5)
Lymphocytes Relative: 10 % — ABNORMAL LOW (ref 12–46)
Lymphs Abs: 1.5 10*3/uL (ref 0.7–4.0)
Monocytes Absolute: 0.6 10*3/uL (ref 0.1–1.0)
Monocytes Relative: 4 % (ref 3–12)
Neutro Abs: 12.7 10*3/uL — ABNORMAL HIGH (ref 1.7–7.7)
Neutrophils Relative %: 85 % — ABNORMAL HIGH (ref 43–77)

## 2010-05-15 LAB — BRAIN NATRIURETIC PEPTIDE: Pro B Natriuretic peptide (BNP): 405 pg/mL — ABNORMAL HIGH (ref 0.0–100.0)

## 2010-05-15 LAB — CBC
HCT: 38.8 % (ref 36.0–46.0)
HCT: 39.6 % (ref 36.0–46.0)
HCT: 42.2 % (ref 36.0–46.0)
HCT: 43.6 % (ref 36.0–46.0)
Hemoglobin: 13 g/dL (ref 12.0–15.0)
Hemoglobin: 13.1 g/dL (ref 12.0–15.0)
Hemoglobin: 13.9 g/dL (ref 12.0–15.0)
Hemoglobin: 14.7 g/dL (ref 12.0–15.0)
MCH: 30.4 pg (ref 26.0–34.0)
MCH: 30.9 pg (ref 26.0–34.0)
MCH: 30.8 pg (ref 26.0–34.0)
MCH: 30.9 pg (ref 26.0–34.0)
MCHC: 33.1 g/dL (ref 30.0–36.0)
MCHC: 33.5 g/dL (ref 30.0–36.0)
MCHC: 32.9 g/dL (ref 30.0–36.0)
MCHC: 33.7 g/dL (ref 30.0–36.0)
MCV: 90.9 fL (ref 78.0–100.0)
MCV: 93.4 fL (ref 78.0–100.0)
MCV: 91.8 fL (ref 78.0–100.0)
MCV: 93.6 fL (ref 78.0–100.0)
Platelets: 240 10*3/uL (ref 150–400)
Platelets: 269 10*3/uL (ref 150–400)
Platelets: 235 10*3/uL (ref 150–400)
Platelets: 265 10*3/uL (ref 150–400)
RBC: 4.24 MIL/uL (ref 3.87–5.11)
RBC: 4.27 MIL/uL (ref 3.87–5.11)
RBC: 4.51 MIL/uL (ref 3.87–5.11)
RBC: 4.75 MIL/uL (ref 3.87–5.11)
RDW: 15 % (ref 11.5–15.5)
RDW: 15.1 % (ref 11.5–15.5)
RDW: 15.1 % (ref 11.5–15.5)
RDW: 15.1 % (ref 11.5–15.5)
WBC: 11.5 10*3/uL — ABNORMAL HIGH (ref 4.0–10.5)
WBC: 8.9 10*3/uL (ref 4.0–10.5)
WBC: 13.6 10*3/uL — ABNORMAL HIGH (ref 4.0–10.5)
WBC: 15 10*3/uL — ABNORMAL HIGH (ref 4.0–10.5)

## 2010-05-15 LAB — BASIC METABOLIC PANEL
BUN: 11 mg/dL (ref 6–23)
BUN: 11 mg/dL (ref 6–23)
CO2: 28 mEq/L (ref 19–32)
CO2: 29 mEq/L (ref 19–32)
Calcium: 9.2 mg/dL (ref 8.4–10.5)
Calcium: 9.5 mg/dL (ref 8.4–10.5)
Chloride: 97 mEq/L (ref 96–112)
Chloride: 100 mEq/L (ref 96–112)
Creatinine, Ser: 1.07 mg/dL (ref 0.4–1.2)
Creatinine, Ser: 1.1 mg/dL (ref 0.4–1.2)
GFR calc Af Amer: >60 mL/min (ref >60)
GFR calc Af Amer: >60 mL/min (ref >60)
GFR calc non Af Amer: 51 mL/min — ABNORMAL LOW (ref >60)
GFR calc non Af Amer: 50 mL/min — ABNORMAL LOW (ref >60)
Glucose, Bld: 97 mg/dL (ref 70–99)
Glucose, Bld: 105 mg/dL — ABNORMAL HIGH (ref 70–99)
Potassium: 3.8 mEq/L (ref 3.5–5.1)
Potassium: 3.9 mEq/L (ref 3.5–5.1)
Sodium: 135 mEq/L (ref 135–145)
Sodium: 140 mEq/L (ref 135–145)

## 2010-05-15 LAB — CARDIAC PANEL(CRET KIN+CKTOT+MB+TROPI)
CK, MB: 1.1 ng/mL (ref 0.3–4.0)
CK, MB: 1.2 ng/mL (ref 0.3–4.0)
Relative Index: INVALID (ref 0.0–2.5)
Relative Index: INVALID (ref 0.0–2.5)
Total CK: 26 U/L (ref 7–177)
Total CK: 30 U/L (ref 7–177)
Troponin I: 0.03 ng/mL (ref 0.00–0.06)
Troponin I: 0.04 ng/mL (ref 0.00–0.06)

## 2010-05-15 LAB — CK TOTAL AND CKMB (NOT AT ARMC)
CK, MB: 1 ng/mL (ref 0.3–4.0)
Relative Index: INVALID (ref 0.0–2.5)
Total CK: 28 U/L (ref 7–177)

## 2010-05-15 LAB — MRSA PCR SCREENING: MRSA by PCR: NEGATIVE

## 2010-05-15 LAB — HEMOGLOBIN A1C
Hgb A1c MFr Bld: 6.1 % — ABNORMAL HIGH (ref <5.7)
Mean Plasma Glucose: 128 mg/dL — ABNORMAL HIGH (ref <117)

## 2010-05-15 LAB — PROTIME-INR
INR: 1.52 — ABNORMAL HIGH (ref 0.00–1.49)
INR: 1.76 — ABNORMAL HIGH (ref 0.00–1.49)
INR: 1.04 (ref 0.00–1.49)
INR: 1.08 (ref 0.00–1.49)
INR: 1.34 (ref 0.00–1.49)
Prothrombin Time: 18.5 seconds — ABNORMAL HIGH (ref 11.6–15.2)
Prothrombin Time: 20.7 seconds — ABNORMAL HIGH (ref 11.6–15.2)
Prothrombin Time: 13.8 seconds (ref 11.6–15.2)
Prothrombin Time: 14.2 seconds (ref 11.6–15.2)
Prothrombin Time: 16.8 seconds — ABNORMAL HIGH (ref 11.6–15.2)

## 2010-05-15 LAB — TROPONIN I: Troponin I: 0.02 ng/mL (ref 0.00–0.06)

## 2010-05-15 LAB — TSH: TSH: 4.149 u[IU]/mL (ref 0.350–4.500)

## 2010-05-15 LAB — MAGNESIUM: Magnesium: 1.7 mg/dL (ref 1.5–2.5)

## 2010-05-15 LAB — APTT: aPTT: 27 seconds (ref 24–37)

## 2010-06-04 LAB — CBC
HCT: 17.4 % — ABNORMAL LOW (ref 36.0–46.0)
HCT: 21.6 % — ABNORMAL LOW (ref 36.0–46.0)
HCT: 22.3 % — ABNORMAL LOW (ref 36.0–46.0)
HCT: 23.9 % — ABNORMAL LOW (ref 36.0–46.0)
HCT: 24.6 % — ABNORMAL LOW (ref 36.0–46.0)
HCT: 24.6 % — ABNORMAL LOW (ref 36.0–46.0)
HCT: 24.9 % — ABNORMAL LOW (ref 36.0–46.0)
HCT: 25.7 % — ABNORMAL LOW (ref 36.0–46.0)
HCT: 25.8 % — ABNORMAL LOW (ref 36.0–46.0)
HCT: 25.9 % — ABNORMAL LOW (ref 36.0–46.0)
HCT: 47 % — ABNORMAL HIGH (ref 36.0–46.0)
Hemoglobin: 16 g/dL — ABNORMAL HIGH (ref 12.0–15.0)
Hemoglobin: 6.2 g/dL — CL (ref 12.0–15.0)
Hemoglobin: 7.4 g/dL — ABNORMAL LOW (ref 12.0–15.0)
Hemoglobin: 7.7 g/dL — ABNORMAL LOW (ref 12.0–15.0)
Hemoglobin: 8.4 g/dL — ABNORMAL LOW (ref 12.0–15.0)
Hemoglobin: 8.5 g/dL — ABNORMAL LOW (ref 12.0–15.0)
Hemoglobin: 8.6 g/dL — ABNORMAL LOW (ref 12.0–15.0)
Hemoglobin: 8.7 g/dL — ABNORMAL LOW (ref 12.0–15.0)
Hemoglobin: 8.8 g/dL — ABNORMAL LOW (ref 12.0–15.0)
Hemoglobin: 8.9 g/dL — ABNORMAL LOW (ref 12.0–15.0)
Hemoglobin: 8.9 g/dL — ABNORMAL LOW (ref 12.0–15.0)
MCHC: 34 g/dL (ref 30.0–36.0)
MCHC: 34.2 g/dL (ref 30.0–36.0)
MCHC: 34.3 g/dL (ref 30.0–36.0)
MCHC: 34.4 g/dL (ref 30.0–36.0)
MCHC: 34.4 g/dL (ref 30.0–36.0)
MCHC: 34.5 g/dL (ref 30.0–36.0)
MCHC: 34.7 g/dL (ref 30.0–36.0)
MCHC: 34.9 g/dL (ref 30.0–36.0)
MCHC: 35 g/dL (ref 30.0–36.0)
MCHC: 35.2 g/dL (ref 30.0–36.0)
MCHC: 35.6 g/dL (ref 30.0–36.0)
MCV: 88.5 fL (ref 78.0–100.0)
MCV: 88.7 fL (ref 78.0–100.0)
MCV: 88.8 fL (ref 78.0–100.0)
MCV: 91 fL (ref 78.0–100.0)
MCV: 91.5 fL (ref 78.0–100.0)
MCV: 91.9 fL (ref 78.0–100.0)
MCV: 92.1 fL (ref 78.0–100.0)
MCV: 92.4 fL (ref 78.0–100.0)
MCV: 92.7 fL (ref 78.0–100.0)
MCV: 93.7 fL (ref 78.0–100.0)
MCV: 95.3 fL (ref 78.0–100.0)
Platelets: 101 10*3/uL — ABNORMAL LOW (ref 150–400)
Platelets: 116 10*3/uL — ABNORMAL LOW (ref 150–400)
Platelets: 119 10*3/uL — ABNORMAL LOW (ref 150–400)
Platelets: 159 10*3/uL (ref 150–400)
Platelets: 237 10*3/uL (ref 150–400)
Platelets: 59 10*3/uL — ABNORMAL LOW (ref 150–400)
Platelets: 64 10*3/uL — ABNORMAL LOW (ref 150–400)
Platelets: 66 10*3/uL — ABNORMAL LOW (ref 150–400)
Platelets: 80 10*3/uL — ABNORMAL LOW (ref 150–400)
Platelets: 90 10*3/uL — ABNORMAL LOW (ref 150–400)
Platelets: 93 10*3/uL — ABNORMAL LOW (ref 150–400)
RBC: 1.96 MIL/uL — ABNORMAL LOW (ref 3.87–5.11)
RBC: 2.35 MIL/uL — ABNORMAL LOW (ref 3.87–5.11)
RBC: 2.45 MIL/uL — ABNORMAL LOW (ref 3.87–5.11)
RBC: 2.6 MIL/uL — ABNORMAL LOW (ref 3.87–5.11)
RBC: 2.61 MIL/uL — ABNORMAL LOW (ref 3.87–5.11)
RBC: 2.67 MIL/uL — ABNORMAL LOW (ref 3.87–5.11)
RBC: 2.76 MIL/uL — ABNORMAL LOW (ref 3.87–5.11)
RBC: 2.77 MIL/uL — ABNORMAL LOW (ref 3.87–5.11)
RBC: 2.78 MIL/uL — ABNORMAL LOW (ref 3.87–5.11)
RBC: 2.82 MIL/uL — ABNORMAL LOW (ref 3.87–5.11)
RBC: 5.3 MIL/uL — ABNORMAL HIGH (ref 3.87–5.11)
RDW: 14.4 % (ref 11.5–15.5)
RDW: 14.6 % (ref 11.5–15.5)
RDW: 14.6 % (ref 11.5–15.5)
RDW: 14.6 % (ref 11.5–15.5)
RDW: 14.6 % (ref 11.5–15.5)
RDW: 14.8 % (ref 11.5–15.5)
RDW: 14.9 % (ref 11.5–15.5)
RDW: 14.9 % (ref 11.5–15.5)
RDW: 15.3 % (ref 11.5–15.5)
RDW: 15.3 % (ref 11.5–15.5)
RDW: 15.5 % (ref 11.5–15.5)
WBC: 11.3 10*3/uL — ABNORMAL HIGH (ref 4.0–10.5)
WBC: 11.5 10*3/uL — ABNORMAL HIGH (ref 4.0–10.5)
WBC: 12 10*3/uL — ABNORMAL HIGH (ref 4.0–10.5)
WBC: 12.2 10*3/uL — ABNORMAL HIGH (ref 4.0–10.5)
WBC: 12.5 10*3/uL — ABNORMAL HIGH (ref 4.0–10.5)
WBC: 13.3 10*3/uL — ABNORMAL HIGH (ref 4.0–10.5)
WBC: 16 10*3/uL — ABNORMAL HIGH (ref 4.0–10.5)
WBC: 16.7 10*3/uL — ABNORMAL HIGH (ref 4.0–10.5)
WBC: 18.6 10*3/uL — ABNORMAL HIGH (ref 4.0–10.5)
WBC: 24.8 10*3/uL — ABNORMAL HIGH (ref 4.0–10.5)
WBC: 40.5 10*3/uL — ABNORMAL HIGH (ref 4.0–10.5)

## 2010-06-04 LAB — BASIC METABOLIC PANEL
BUN: 13 mg/dL (ref 6–23)
BUN: 16 mg/dL (ref 6–23)
BUN: 16 mg/dL (ref 6–23)
BUN: 17 mg/dL (ref 6–23)
BUN: 18 mg/dL (ref 6–23)
BUN: 19 mg/dL (ref 6–23)
BUN: 20 mg/dL (ref 6–23)
CO2: 24 mEq/L (ref 19–32)
CO2: 24 mEq/L (ref 19–32)
CO2: 26 mEq/L (ref 19–32)
CO2: 29 mEq/L (ref 19–32)
CO2: 29 mEq/L (ref 19–32)
CO2: 30 mEq/L (ref 19–32)
CO2: 30 mEq/L (ref 19–32)
Calcium: 7.8 mg/dL — ABNORMAL LOW (ref 8.4–10.5)
Calcium: 7.9 mg/dL — ABNORMAL LOW (ref 8.4–10.5)
Calcium: 7.9 mg/dL — ABNORMAL LOW (ref 8.4–10.5)
Calcium: 8 mg/dL — ABNORMAL LOW (ref 8.4–10.5)
Calcium: 8 mg/dL — ABNORMAL LOW (ref 8.4–10.5)
Calcium: 8.2 mg/dL — ABNORMAL LOW (ref 8.4–10.5)
Calcium: 8.2 mg/dL — ABNORMAL LOW (ref 8.4–10.5)
Chloride: 100 mEq/L (ref 96–112)
Chloride: 100 mEq/L (ref 96–112)
Chloride: 102 mEq/L (ref 96–112)
Chloride: 102 mEq/L (ref 96–112)
Chloride: 104 mEq/L (ref 96–112)
Chloride: 107 mEq/L (ref 96–112)
Chloride: 111 mEq/L (ref 96–112)
Creatinine, Ser: 1.03 mg/dL (ref 0.4–1.2)
Creatinine, Ser: 1.11 mg/dL (ref 0.4–1.2)
Creatinine, Ser: 1.14 mg/dL (ref 0.4–1.2)
Creatinine, Ser: 1.24 mg/dL — ABNORMAL HIGH (ref 0.4–1.2)
Creatinine, Ser: 1.25 mg/dL — ABNORMAL HIGH (ref 0.4–1.2)
Creatinine, Ser: 1.28 mg/dL — ABNORMAL HIGH (ref 0.4–1.2)
Creatinine, Ser: 1.29 mg/dL — ABNORMAL HIGH (ref 0.4–1.2)
GFR calc Af Amer: 50 mL/min — ABNORMAL LOW (ref >60)
GFR calc Af Amer: 51 mL/min — ABNORMAL LOW (ref >60)
GFR calc Af Amer: 52 mL/min — ABNORMAL LOW (ref >60)
GFR calc Af Amer: 53 mL/min — ABNORMAL LOW (ref >60)
GFR calc Af Amer: 58 mL/min — ABNORMAL LOW (ref >60)
GFR calc Af Amer: 60 mL/min — ABNORMAL LOW (ref >60)
GFR calc Af Amer: >60 mL/min (ref >60)
GFR calc non Af Amer: 41 mL/min — ABNORMAL LOW (ref >60)
GFR calc non Af Amer: 42 mL/min — ABNORMAL LOW (ref >60)
GFR calc non Af Amer: 43 mL/min — ABNORMAL LOW (ref >60)
GFR calc non Af Amer: 43 mL/min — ABNORMAL LOW (ref >60)
GFR calc non Af Amer: 48 mL/min — ABNORMAL LOW (ref >60)
GFR calc non Af Amer: 49 mL/min — ABNORMAL LOW (ref >60)
GFR calc non Af Amer: 54 mL/min — ABNORMAL LOW (ref >60)
Glucose, Bld: 102 mg/dL — ABNORMAL HIGH (ref 70–99)
Glucose, Bld: 109 mg/dL — ABNORMAL HIGH (ref 70–99)
Glucose, Bld: 114 mg/dL — ABNORMAL HIGH (ref 70–99)
Glucose, Bld: 122 mg/dL — ABNORMAL HIGH (ref 70–99)
Glucose, Bld: 145 mg/dL — ABNORMAL HIGH (ref 70–99)
Glucose, Bld: 147 mg/dL — ABNORMAL HIGH (ref 70–99)
Glucose, Bld: 169 mg/dL — ABNORMAL HIGH (ref 70–99)
Potassium: 3.7 mEq/L (ref 3.5–5.1)
Potassium: 3.8 mEq/L (ref 3.5–5.1)
Potassium: 4 mEq/L (ref 3.5–5.1)
Potassium: 4.1 mEq/L (ref 3.5–5.1)
Potassium: 4.2 mEq/L (ref 3.5–5.1)
Potassium: 4.6 mEq/L (ref 3.5–5.1)
Potassium: 4.9 mEq/L (ref 3.5–5.1)
Sodium: 134 mEq/L — ABNORMAL LOW (ref 135–145)
Sodium: 134 mEq/L — ABNORMAL LOW (ref 135–145)
Sodium: 135 mEq/L (ref 135–145)
Sodium: 135 mEq/L (ref 135–145)
Sodium: 137 mEq/L (ref 135–145)
Sodium: 138 mEq/L (ref 135–145)
Sodium: 138 mEq/L (ref 135–145)

## 2010-06-04 LAB — POCT I-STAT 7, (LYTES, BLD GAS, ICA,H+H)
Acid-base deficit: 1 mmol/L (ref 0.0–2.0)
Acid-base deficit: 3 mmol/L — ABNORMAL HIGH (ref 0.0–2.0)
Bicarbonate: 23 mEq/L (ref 20.0–24.0)
Bicarbonate: 24.8 mEq/L — ABNORMAL HIGH (ref 20.0–24.0)
Calcium, Ion: 1.28 mmol/L (ref 1.12–1.32)
Calcium, Ion: 1.36 mmol/L — ABNORMAL HIGH (ref 1.12–1.32)
HCT: 15 % — ABNORMAL LOW (ref 36.0–46.0)
HCT: 22 % — ABNORMAL LOW (ref 36.0–46.0)
Hemoglobin: 5.1 g/dL — CL (ref 12.0–15.0)
Hemoglobin: 7.5 g/dL — ABNORMAL LOW (ref 12.0–15.0)
O2 Saturation: 97 %
O2 Saturation: 99 %
Patient temperature: 36.9
Patient temperature: 37.5
Potassium: 4.7 mEq/L (ref 3.5–5.1)
Potassium: 5 mEq/L (ref 3.5–5.1)
Sodium: 138 mEq/L (ref 135–145)
Sodium: 140 mEq/L (ref 135–145)
TCO2: 24 mmol/L (ref 0–100)
TCO2: 26 mmol/L (ref 0–100)
pCO2 arterial: 46.2 mmHg — ABNORMAL HIGH (ref 35.0–45.0)
pCO2 arterial: 47 mmHg — ABNORMAL HIGH (ref 35.0–45.0)
pH, Arterial: 7.301 — ABNORMAL LOW (ref 7.350–7.400)
pH, Arterial: 7.337 — ABNORMAL LOW (ref 7.350–7.400)
pO2, Arterial: 144 mmHg — ABNORMAL HIGH (ref 80.0–100.0)
pO2, Arterial: 99 mmHg (ref 80.0–100.0)

## 2010-06-04 LAB — POCT I-STAT 3, ART BLOOD GAS (G3+)
Acid-Base Excess: 2 mmol/L (ref 0.0–2.0)
Acid-base deficit: 1 mmol/L (ref 0.0–2.0)
Acid-base deficit: 1 mmol/L (ref 0.0–2.0)
Acid-base deficit: 10 mmol/L — ABNORMAL HIGH (ref 0.0–2.0)
Acid-base deficit: 2 mmol/L (ref 0.0–2.0)
Acid-base deficit: 3 mmol/L — ABNORMAL HIGH (ref 0.0–2.0)
Acid-base deficit: 3 mmol/L — ABNORMAL HIGH (ref 0.0–2.0)
Acid-base deficit: 3 mmol/L — ABNORMAL HIGH (ref 0.0–2.0)
Acid-base deficit: 5 mmol/L — ABNORMAL HIGH (ref 0.0–2.0)
Acid-base deficit: 5 mmol/L — ABNORMAL HIGH (ref 0.0–2.0)
Acid-base deficit: 9 mmol/L — ABNORMAL HIGH (ref 0.0–2.0)
Bicarbonate: 17.2 mEq/L — ABNORMAL LOW (ref 20.0–24.0)
Bicarbonate: 17.5 mEq/L — ABNORMAL LOW (ref 20.0–24.0)
Bicarbonate: 21.2 mEq/L (ref 20.0–24.0)
Bicarbonate: 21.8 mEq/L (ref 20.0–24.0)
Bicarbonate: 21.8 mEq/L (ref 20.0–24.0)
Bicarbonate: 22.5 mEq/L (ref 20.0–24.0)
Bicarbonate: 22.9 mEq/L (ref 20.0–24.0)
Bicarbonate: 23.4 mEq/L (ref 20.0–24.0)
Bicarbonate: 23.9 mEq/L (ref 20.0–24.0)
Bicarbonate: 24.1 mEq/L — ABNORMAL HIGH (ref 20.0–24.0)
Bicarbonate: 26.9 mEq/L — ABNORMAL HIGH (ref 20.0–24.0)
O2 Saturation: 100 %
O2 Saturation: 100 %
O2 Saturation: 91 %
O2 Saturation: 92 %
O2 Saturation: 93 %
O2 Saturation: 95 %
O2 Saturation: 97 %
O2 Saturation: 97 %
O2 Saturation: 97 %
O2 Saturation: 98 %
O2 Saturation: 99 %
Patient temperature: 35.4
Patient temperature: 36.3
Patient temperature: 36.7
Patient temperature: 37.4
Patient temperature: 37.5
Patient temperature: 37.5
Patient temperature: 99
Patient temperature: 99.5
TCO2: 18 mmol/L (ref 0–100)
TCO2: 19 mmol/L (ref 0–100)
TCO2: 23 mmol/L (ref 0–100)
TCO2: 23 mmol/L (ref 0–100)
TCO2: 23 mmol/L (ref 0–100)
TCO2: 24 mmol/L (ref 0–100)
TCO2: 24 mmol/L (ref 0–100)
TCO2: 25 mmol/L (ref 0–100)
TCO2: 25 mmol/L (ref 0–100)
TCO2: 25 mmol/L (ref 0–100)
TCO2: 28 mmol/L (ref 0–100)
pCO2 arterial: 36.1 mmHg (ref 35.0–45.0)
pCO2 arterial: 36.3 mmHg (ref 35.0–45.0)
pCO2 arterial: 39.8 mmHg (ref 35.0–45.0)
pCO2 arterial: 39.9 mmHg (ref 35.0–45.0)
pCO2 arterial: 40.3 mmHg (ref 35.0–45.0)
pCO2 arterial: 40.4 mmHg (ref 35.0–45.0)
pCO2 arterial: 43.6 mmHg (ref 35.0–45.0)
pCO2 arterial: 44.9 mmHg (ref 35.0–45.0)
pCO2 arterial: 47.1 mmHg — ABNORMAL HIGH (ref 35.0–45.0)
pCO2 arterial: 48.4 mmHg — ABNORMAL HIGH (ref 35.0–45.0)
pCO2 arterial: 49.3 mmHg — ABNORMAL HIGH (ref 35.0–45.0)
pH, Arterial: 7.234 — ABNORMAL LOW (ref 7.350–7.400)
pH, Arterial: 7.246 — ABNORMAL LOW (ref 7.350–7.400)
pH, Arterial: 7.277 — ABNORMAL LOW (ref 7.350–7.400)
pH, Arterial: 7.278 — ABNORMAL LOW (ref 7.350–7.400)
pH, Arterial: 7.286 — ABNORMAL LOW (ref 7.350–7.400)
pH, Arterial: 7.292 — ABNORMAL LOW (ref 7.350–7.400)
pH, Arterial: 7.349 — ABNORMAL LOW (ref 7.350–7.400)
pH, Arterial: 7.384 (ref 7.350–7.400)
pH, Arterial: 7.4 (ref 7.350–7.400)
pH, Arterial: 7.403 — ABNORMAL HIGH (ref 7.350–7.400)
pH, Arterial: 7.418 — ABNORMAL HIGH (ref 7.350–7.400)
pO2, Arterial: 101 mmHg — ABNORMAL HIGH (ref 80.0–100.0)
pO2, Arterial: 102 mmHg — ABNORMAL HIGH (ref 80.0–100.0)
pO2, Arterial: 133 mmHg — ABNORMAL HIGH (ref 80.0–100.0)
pO2, Arterial: 138 mmHg — ABNORMAL HIGH (ref 80.0–100.0)
pO2, Arterial: 203 mmHg — ABNORMAL HIGH (ref 80.0–100.0)
pO2, Arterial: 266 mmHg — ABNORMAL HIGH (ref 80.0–100.0)
pO2, Arterial: 64 mmHg — ABNORMAL LOW (ref 80.0–100.0)
pO2, Arterial: 68 mmHg — ABNORMAL LOW (ref 80.0–100.0)
pO2, Arterial: 69 mmHg — ABNORMAL LOW (ref 80.0–100.0)
pO2, Arterial: 86 mmHg (ref 80.0–100.0)
pO2, Arterial: 94 mmHg (ref 80.0–100.0)

## 2010-06-04 LAB — PROTIME-INR
INR: 1.15 (ref 0.00–1.49)
INR: 1.28 (ref 0.00–1.49)
INR: 1.48 (ref 0.00–1.49)
INR: 1.77 — ABNORMAL HIGH (ref 0.00–1.49)
INR: 2.81 — ABNORMAL HIGH (ref 0.00–1.49)
Prothrombin Time: 14.6 seconds (ref 11.6–15.2)
Prothrombin Time: 15.9 seconds — ABNORMAL HIGH (ref 11.6–15.2)
Prothrombin Time: 17.8 seconds — ABNORMAL HIGH (ref 11.6–15.2)
Prothrombin Time: 20.5 seconds — ABNORMAL HIGH (ref 11.6–15.2)
Prothrombin Time: 29.4 seconds — ABNORMAL HIGH (ref 11.6–15.2)

## 2010-06-04 LAB — POCT I-STAT, CHEM 8
BUN: 16 mg/dL (ref 6–23)
BUN: 21 mg/dL (ref 6–23)
Calcium, Ion: 1.19 mmol/L (ref 1.12–1.32)
Calcium, Ion: 1.37 mmol/L — ABNORMAL HIGH (ref 1.12–1.32)
Chloride: 107 mEq/L (ref 96–112)
Chloride: 98 mEq/L (ref 96–112)
Creatinine, Ser: 1 mg/dL (ref 0.4–1.2)
Creatinine, Ser: 1.3 mg/dL — ABNORMAL HIGH (ref 0.4–1.2)
Glucose, Bld: 117 mg/dL — ABNORMAL HIGH (ref 70–99)
Glucose, Bld: 140 mg/dL — ABNORMAL HIGH (ref 70–99)
HCT: 21 % — ABNORMAL LOW (ref 36.0–46.0)
HCT: 22 % — ABNORMAL LOW (ref 36.0–46.0)
Hemoglobin: 7.1 g/dL — ABNORMAL LOW (ref 12.0–15.0)
Hemoglobin: 7.5 g/dL — ABNORMAL LOW (ref 12.0–15.0)
Potassium: 3.9 mEq/L (ref 3.5–5.1)
Potassium: 4.9 mEq/L (ref 3.5–5.1)
Sodium: 136 mEq/L (ref 135–145)
Sodium: 139 mEq/L (ref 135–145)
TCO2: 23 mmol/L (ref 0–100)
TCO2: 25 mmol/L (ref 0–100)

## 2010-06-04 LAB — GLUCOSE, CAPILLARY
Glucose-Capillary: 101 mg/dL — ABNORMAL HIGH (ref 70–99)
Glucose-Capillary: 101 mg/dL — ABNORMAL HIGH (ref 70–99)
Glucose-Capillary: 105 mg/dL — ABNORMAL HIGH (ref 70–99)
Glucose-Capillary: 105 mg/dL — ABNORMAL HIGH (ref 70–99)
Glucose-Capillary: 106 mg/dL — ABNORMAL HIGH (ref 70–99)
Glucose-Capillary: 109 mg/dL — ABNORMAL HIGH (ref 70–99)
Glucose-Capillary: 109 mg/dL — ABNORMAL HIGH (ref 70–99)
Glucose-Capillary: 109 mg/dL — ABNORMAL HIGH (ref 70–99)
Glucose-Capillary: 112 mg/dL — ABNORMAL HIGH (ref 70–99)
Glucose-Capillary: 114 mg/dL — ABNORMAL HIGH (ref 70–99)
Glucose-Capillary: 114 mg/dL — ABNORMAL HIGH (ref 70–99)
Glucose-Capillary: 116 mg/dL — ABNORMAL HIGH (ref 70–99)
Glucose-Capillary: 116 mg/dL — ABNORMAL HIGH (ref 70–99)
Glucose-Capillary: 117 mg/dL — ABNORMAL HIGH (ref 70–99)
Glucose-Capillary: 117 mg/dL — ABNORMAL HIGH (ref 70–99)
Glucose-Capillary: 120 mg/dL — ABNORMAL HIGH (ref 70–99)
Glucose-Capillary: 121 mg/dL — ABNORMAL HIGH (ref 70–99)
Glucose-Capillary: 123 mg/dL — ABNORMAL HIGH (ref 70–99)
Glucose-Capillary: 123 mg/dL — ABNORMAL HIGH (ref 70–99)
Glucose-Capillary: 123 mg/dL — ABNORMAL HIGH (ref 70–99)
Glucose-Capillary: 124 mg/dL — ABNORMAL HIGH (ref 70–99)
Glucose-Capillary: 125 mg/dL — ABNORMAL HIGH (ref 70–99)
Glucose-Capillary: 125 mg/dL — ABNORMAL HIGH (ref 70–99)
Glucose-Capillary: 125 mg/dL — ABNORMAL HIGH (ref 70–99)
Glucose-Capillary: 126 mg/dL — ABNORMAL HIGH (ref 70–99)
Glucose-Capillary: 128 mg/dL — ABNORMAL HIGH (ref 70–99)
Glucose-Capillary: 131 mg/dL — ABNORMAL HIGH (ref 70–99)
Glucose-Capillary: 133 mg/dL — ABNORMAL HIGH (ref 70–99)
Glucose-Capillary: 133 mg/dL — ABNORMAL HIGH (ref 70–99)
Glucose-Capillary: 136 mg/dL — ABNORMAL HIGH (ref 70–99)
Glucose-Capillary: 137 mg/dL — ABNORMAL HIGH (ref 70–99)
Glucose-Capillary: 141 mg/dL — ABNORMAL HIGH (ref 70–99)
Glucose-Capillary: 146 mg/dL — ABNORMAL HIGH (ref 70–99)
Glucose-Capillary: 154 mg/dL — ABNORMAL HIGH (ref 70–99)
Glucose-Capillary: 170 mg/dL — ABNORMAL HIGH (ref 70–99)
Glucose-Capillary: 80 mg/dL (ref 70–99)
Glucose-Capillary: 86 mg/dL (ref 70–99)
Glucose-Capillary: 88 mg/dL (ref 70–99)
Glucose-Capillary: 93 mg/dL (ref 70–99)
Glucose-Capillary: 93 mg/dL (ref 70–99)
Glucose-Capillary: 97 mg/dL (ref 70–99)

## 2010-06-04 LAB — POCT I-STAT 4, (NA,K, GLUC, HGB,HCT)
Glucose, Bld: 110 mg/dL — ABNORMAL HIGH (ref 70–99)
Glucose, Bld: 180 mg/dL — ABNORMAL HIGH (ref 70–99)
Glucose, Bld: 205 mg/dL — ABNORMAL HIGH (ref 70–99)
Glucose, Bld: 90 mg/dL (ref 70–99)
Glucose, Bld: 94 mg/dL (ref 70–99)
Glucose, Bld: 97 mg/dL (ref 70–99)
HCT: 18 % — ABNORMAL LOW (ref 36.0–46.0)
HCT: 25 % — ABNORMAL LOW (ref 36.0–46.0)
HCT: 27 % — ABNORMAL LOW (ref 36.0–46.0)
HCT: 33 % — ABNORMAL LOW (ref 36.0–46.0)
HCT: 39 % (ref 36.0–46.0)
HCT: 42 % (ref 36.0–46.0)
Hemoglobin: 11.2 g/dL — ABNORMAL LOW (ref 12.0–15.0)
Hemoglobin: 13.3 g/dL (ref 12.0–15.0)
Hemoglobin: 14.3 g/dL (ref 12.0–15.0)
Hemoglobin: 6.1 g/dL — CL (ref 12.0–15.0)
Hemoglobin: 8.5 g/dL — ABNORMAL LOW (ref 12.0–15.0)
Hemoglobin: 9.2 g/dL — ABNORMAL LOW (ref 12.0–15.0)
Potassium: 4.5 mEq/L (ref 3.5–5.1)
Potassium: 4.7 mEq/L (ref 3.5–5.1)
Potassium: 5 mEq/L (ref 3.5–5.1)
Potassium: 5.1 mEq/L (ref 3.5–5.1)
Potassium: 5.3 mEq/L — ABNORMAL HIGH (ref 3.5–5.1)
Potassium: 5.5 mEq/L — ABNORMAL HIGH (ref 3.5–5.1)
Sodium: 128 mEq/L — ABNORMAL LOW (ref 135–145)
Sodium: 133 mEq/L — ABNORMAL LOW (ref 135–145)
Sodium: 134 mEq/L — ABNORMAL LOW (ref 135–145)
Sodium: 138 mEq/L (ref 135–145)
Sodium: 138 mEq/L (ref 135–145)
Sodium: 140 mEq/L (ref 135–145)

## 2010-06-04 LAB — POCT I-STAT GLUCOSE
Glucose, Bld: 88 mg/dL (ref 70–99)
Glucose, Bld: 90 mg/dL (ref 70–99)
Operator id: 3406
Operator id: 3406

## 2010-06-04 LAB — DIFFERENTIAL
Basophils Absolute: 0.1 10*3/uL (ref 0.0–0.1)
Basophils Relative: 0 % (ref 0–1)
Eosinophils Absolute: 0.1 10*3/uL (ref 0.0–0.7)
Eosinophils Relative: 1 % (ref 0–5)
Lymphocytes Relative: 16 % (ref 12–46)
Lymphs Abs: 3 10*3/uL (ref 0.7–4.0)
Monocytes Absolute: 0.9 10*3/uL (ref 0.1–1.0)
Monocytes Relative: 5 % (ref 3–12)
Neutro Abs: 14.6 10*3/uL — ABNORMAL HIGH (ref 1.7–7.7)
Neutrophils Relative %: 78 % — ABNORMAL HIGH (ref 43–77)

## 2010-06-04 LAB — URINE MICROSCOPIC-ADD ON

## 2010-06-04 LAB — APTT: aPTT: 50 seconds — ABNORMAL HIGH (ref 24–37)

## 2010-06-04 LAB — URINALYSIS, ROUTINE W REFLEX MICROSCOPIC
Bilirubin Urine: NEGATIVE
Glucose, UA: NEGATIVE mg/dL
Hgb urine dipstick: NEGATIVE
Ketones, ur: NEGATIVE mg/dL
Nitrite: NEGATIVE
Protein, ur: NEGATIVE mg/dL
Specific Gravity, Urine: 1.016 (ref 1.005–1.030)
Urobilinogen, UA: 0.2 mg/dL (ref 0.0–1.0)
pH: 5.5 (ref 5.0–8.0)

## 2010-06-04 LAB — HEMOGLOBIN AND HEMATOCRIT, BLOOD
HCT: 19.4 % — ABNORMAL LOW (ref 36.0–46.0)
Hemoglobin: 6.7 g/dL — CL (ref 12.0–15.0)

## 2010-06-04 LAB — CREATININE, SERUM
Creatinine, Ser: 0.96 mg/dL (ref 0.4–1.2)
GFR calc Af Amer: >60 mL/min (ref >60)
GFR calc non Af Amer: 58 mL/min — ABNORMAL LOW (ref >60)

## 2010-06-04 LAB — MAGNESIUM
Magnesium: 1.7 mg/dL (ref 1.5–2.5)
Magnesium: 1.9 mg/dL (ref 1.5–2.5)
Magnesium: 2.4 mg/dL (ref 1.5–2.5)

## 2010-06-04 LAB — URINE CULTURE: Colony Count: 25000

## 2010-06-04 LAB — PLATELET COUNT: Platelets: 120 10*3/uL — ABNORMAL LOW (ref 150–400)

## 2010-06-04 LAB — MRSA PCR SCREENING: MRSA by PCR: NEGATIVE

## 2010-06-05 ENCOUNTER — Other Ambulatory Visit: Payer: Self-pay | Admitting: Critical Care Medicine

## 2010-06-05 LAB — URINALYSIS, ROUTINE W REFLEX MICROSCOPIC
Bilirubin Urine: NEGATIVE
Glucose, UA: NEGATIVE mg/dL
Hgb urine dipstick: NEGATIVE
Ketones, ur: NEGATIVE mg/dL
Nitrite: NEGATIVE
Protein, ur: NEGATIVE mg/dL
Specific Gravity, Urine: 1.016 (ref 1.005–1.030)
Urobilinogen, UA: 0.2 mg/dL (ref 0.0–1.0)
pH: 5 (ref 5.0–8.0)

## 2010-06-05 LAB — TYPE AND SCREEN
ABO/RH(D): O POS
Antibody Screen: NEGATIVE

## 2010-06-05 LAB — CBC
HCT: 45 % (ref 36.0–46.0)
Hemoglobin: 15.2 g/dL — ABNORMAL HIGH (ref 12.0–15.0)
MCHC: 33.8 g/dL (ref 30.0–36.0)
MCV: 88.8 fL (ref 78.0–100.0)
Platelets: 238 10*3/uL (ref 150–400)
RBC: 5.07 MIL/uL (ref 3.87–5.11)
RDW: 15.1 % (ref 11.5–15.5)
WBC: 20.7 10*3/uL — ABNORMAL HIGH (ref 4.0–10.5)

## 2010-06-05 LAB — URINE MICROSCOPIC-ADD ON

## 2010-06-05 LAB — PROTIME-INR
INR: 0.89 (ref 0.00–1.49)
Prothrombin Time: 12 seconds (ref 11.6–15.2)

## 2010-06-05 LAB — COMPREHENSIVE METABOLIC PANEL
ALT: 11 U/L (ref 0–35)
AST: 20 U/L (ref 0–37)
Albumin: 3.7 g/dL (ref 3.5–5.2)
Alkaline Phosphatase: 64 U/L (ref 39–117)
BUN: 34 mg/dL — ABNORMAL HIGH (ref 6–23)
CO2: 22 mEq/L (ref 19–32)
Calcium: 10.2 mg/dL (ref 8.4–10.5)
Chloride: 98 mEq/L (ref 96–112)
Creatinine, Ser: 1.11 mg/dL (ref 0.4–1.2)
GFR calc Af Amer: 60 mL/min — ABNORMAL LOW (ref >60)
GFR calc non Af Amer: 49 mL/min — ABNORMAL LOW (ref >60)
Glucose, Bld: 94 mg/dL (ref 70–99)
Potassium: 4.9 mEq/L (ref 3.5–5.1)
Sodium: 130 mEq/L — ABNORMAL LOW (ref 135–145)
Total Bilirubin: 0.5 mg/dL (ref 0.3–1.2)
Total Protein: 6.3 g/dL (ref 6.0–8.3)

## 2010-06-05 LAB — BLOOD GAS, ARTERIAL
Acid-Base Excess: 0.7 mmol/L (ref 0.0–2.0)
Bicarbonate: 25.2 mEq/L — ABNORMAL HIGH (ref 20.0–24.0)
Drawn by: 181601
FIO2: 0.21 %
O2 Saturation: 97.3 %
Patient temperature: 98.6
TCO2: 26.5 mmol/L (ref 0–100)
pCO2 arterial: 43.6 mmHg (ref 35.0–45.0)
pH, Arterial: 7.38 (ref 7.350–7.400)
pO2, Arterial: 85.2 mmHg (ref 80.0–100.0)

## 2010-06-05 LAB — HEMOGLOBIN A1C
Hgb A1c MFr Bld: 5.8 % (ref 4.6–6.1)
Mean Plasma Glucose: 120 mg/dL

## 2010-06-05 LAB — APTT: aPTT: 23 seconds — ABNORMAL LOW (ref 24–37)

## 2010-06-05 LAB — ABO/RH: ABO/RH(D): O POS

## 2010-06-05 LAB — POCT I-STAT GLUCOSE
Glucose, Bld: 93 mg/dL (ref 70–99)
Operator id: 221371

## 2010-06-05 NOTE — Telephone Encounter (Signed)
According to my records we stopped symbicort early in 2011. She needs an OV to discuss

## 2010-06-09 NOTE — Telephone Encounter (Signed)
Called, spoke with pt.  She was informed Symbicort was stopped in Feb 2011 and aware OV needed to discuss med regiman.  She verbalized understanding of this.  OV scheduled with PW for 06/11/10 at 11:15am - pt aware.

## 2010-06-11 ENCOUNTER — Ambulatory Visit (INDEPENDENT_AMBULATORY_CARE_PROVIDER_SITE_OTHER): Payer: Medicare Other | Admitting: Critical Care Medicine

## 2010-06-11 ENCOUNTER — Encounter: Payer: Self-pay | Admitting: Critical Care Medicine

## 2010-06-11 DIAGNOSIS — F17200 Nicotine dependence, unspecified, uncomplicated: Secondary | ICD-10-CM

## 2010-06-11 DIAGNOSIS — I4891 Unspecified atrial fibrillation: Secondary | ICD-10-CM

## 2010-06-11 DIAGNOSIS — J449 Chronic obstructive pulmonary disease, unspecified: Secondary | ICD-10-CM

## 2010-06-11 MED ORDER — MOXIFLOXACIN HCL 400 MG PO TABS: 400.0000 mg | ORAL_TABLET | Freq: Every day | ORAL | Status: AC

## 2010-06-11 MED ORDER — VARENICLINE TARTRATE 0.5 MG PO TABS: 0.5000 mg | ORAL_TABLET | Freq: Two times a day (BID) | ORAL | Status: AC

## 2010-06-11 MED ORDER — BUDESONIDE-FORMOTEROL FUMARATE 160-4.5 MCG/ACT IN AERO: 2.0000 | INHALATION_SPRAY | Freq: Two times a day (BID) | RESPIRATORY_TRACT | Status: DC

## 2010-06-11 NOTE — Assessment & Plan Note (Signed)
Severe COPD Golds stage III with ongoing tobacco abuse Now with mild exacerbation d/t acute tracheobronchitis Plan Avelox 400mg /d x 5 days , samples given Resume symbicort two puff bid D/c spiriva Smoking cessation with chantix

## 2010-06-11 NOTE — Progress Notes (Signed)
Subjective:    Patient ID: Gabrielle [name], female    DOB: [birth year], 65 y.o.   MRN: [id number]  HPI  April 02, 2009 12:14 PM  Notes is still dyspneic. Has not yet started rehab. Has started smoking again. Has been noncompliant with inhalers, is confused re: meds.  Dyspnea may be worse. No real cough or chest pain.  06/11/2010 Not seen since 2/11 At that ov we stopped symbicort and asked pt to stop smoking Still smoking,  Using rescue more,  Did not like using spiriva due to the capsule device, preferred symbicort Smokes 1-2 cigs every three days. Now notes some cough with mucus,  Last four weeks mucus is clear to green and not doing as well. No real chest pain is noted.    Past Medical History  Diagnosis Date  . Chronic headache   . Hyperlipidemia   . Asthma   . Chronic bronchitis   . Heart attack   . Arthritis   . Hypertension   . Depression   . COPD (chronic obstructive pulmonary disease)   . Atrial fibrillation with controlled ventricular response 11/2009     Family History  Problem Relation Age of Onset  . Heart disease Father   . Lung cancer Maternal Grandfather   . Lung cancer Paternal Grandfather      History   Social History  . Marital Status: Divorced    Spouse Name: N/A    Number of Children: N/A  . Years of Education: N/A   Occupational History  . retired Clinical biochemist   Social History Main Topics  . Smoking status: Current Some Day Smoker -- 0.2 packs/day    Types: Cigarettes  . Smokeless tobacco: Never Used   Comment: Started at age 52  . Alcohol Use: Not on file  . Drug Use: Not on file  . Sexually Active: Not on file   Other Topics Concern  . Not on file   Social History Narrative  . No narrative on file     Allergies  Allergen Reactions  . Penicillins     REACTION: hives  . Procaine Hcl     REACTION: palpatations         Review of Systems Constitutional:   No  weight loss, night sweats,  Fevers, chills,  fatigue, lassitude. HEENT:   No headaches,  Difficulty swallowing,  Tooth/dental problems,  Sore throat,                No sneezing, itching, ear ache, nasal congestion, post nasal drip,   CV:  No chest pain,  Orthopnea, PND, swelling in lower extremities, anasarca, dizziness, palpitations  GI  Notes  Heartburn,notes  indigestion, no abdominal pain, nausea, vomiting, diarrhea, change in bowel habits, loss of appetite  Resp: Notes  shortness of breath with exertion not   at rest.  Notes  excess mucus, notes t productive cough,  Notes  non-productive cough,  No coughing up of blood.  Notes a  change in color of mucus.  Notes some  wheezing.  No chest wall deformity  Skin: no rash or lesions.  GU: no dysuria, change in color of urine, no urgency or frequency.  No flank pain.  MS:  No joint pain or swelling.  No decreased range of motion.  No back pain.  Psych:  No change in mood or affect. No depression or anxiety.  No memory loss.     Objective:   Physical Exam Gen: Pleasant, well-nourished, in no  distress,  normal affect  ENT: No lesions,  mouth clear,  oropharynx clear, no postnasal drip  Neck: No JVD, no TMG, no carotid bruits  Lungs: No use of accessory muscles, no dullness to percussion, expired wheeze, poor airflow  Cardiovascular: RRR, heart sounds normal, no murmur or gallops, no peripheral edema  Abdomen: soft and NT, no HSM,  BS normal  Musculoskeletal: No deformities, no cyanosis or clubbing  Neuro: alert, non focal  Skin: Warm, no lesions or rashes        Assessment & Plan:   C O P D Severe COPD Golds stage III with ongoing tobacco abuse Now with mild exacerbation d/t acute tracheobronchitis Plan Avelox 400mg /d x 5 days , samples given Resume symbicort two puff bid D/c spiriva Smoking cessation with chantix   NICOTINE ADDICTION Trial of chantix  10 min spent on smoking cessation counselling    Updated Medication List Outpatient Encounter  Prescriptions as of 06/11/2010  Medication Sig Dispense Refill  . albuterol (PROVENTIL HFA) 108 (90 BASE) MCG/ACT inhaler Inhale 2 puffs into the lungs every 6 (six) hours as needed.        . ALPRAZolam (XANAX) 0.5 MG tablet Take 0.5 mg by mouth 3 (three) times daily as needed.        Marland Kitchen aspirin 81 MG tablet Take 81 mg by mouth daily.        Marland Kitchen diltiazem (CARDIZEM) 120 MG tablet Take 1 tablet by mouth Daily.      . hydrocodone-acetaminophen (LORCET-HD) 5-500 MG per capsule Take 1 capsule by mouth every 6 (six) hours as needed.        . methotrexate (RHEUMATREX) 2.5 MG tablet Take 4 tablets by mouth Once a week.      . metoprolol (TOPROL-XL) 50 MG 24 hr tablet Take 50 mg by mouth daily.        . simvastatin (ZOCOR) 10 MG tablet Take 10 mg by mouth at bedtime.        . sotalol (BETAPACE) 80 MG tablet Take 1 tablet by mouth Twice daily.      Marland Kitchen warfarin (COUMADIN) 5 MG tablet Take as directed      . budesonide-formoterol (SYMBICORT) 160-4.5 MCG/ACT inhaler Inhale 2 puffs into the lungs 2 (two) times daily.  1 Inhaler  12  . moxifloxacin (AVELOX) 400 MG tablet Take 1 tablet (400 mg total) by mouth daily.  5 tablet  0  . varenicline (CHANTIX) 0.5 MG tablet Take 1 tablet (0.5 mg total) by mouth 2 (two) times daily.  60 tablet  2  . DISCONTD: ALPRAZolam (XANAX) 0.25 MG tablet Take 0.25 mg by mouth at bedtime as needed.       Marland Kitchen DISCONTD: nicotine (NICOTROL) 10 MG inhaler Inhale 1 puff into the lungs as needed.        Marland Kitchen DISCONTD: PARoxetine (PAXIL) 20 MG tablet Take 20 mg by mouth 2 (two) times daily.        Marland Kitchen DISCONTD: potassium chloride SA (K-DUR,KLOR-CON) 20 MEQ tablet Take 20 mEq by mouth daily.        Marland Kitchen DISCONTD: simvastatin (ZOCOR) 40 MG tablet Take 40 mg by mouth at bedtime.        Marland Kitchen DISCONTD: tiotropium (SPIRIVA) 18 MCG inhalation capsule Place 18 mcg into inhaler and inhale daily.

## 2010-06-11 NOTE — Assessment & Plan Note (Signed)
Trial of chantix  10 min spent on smoking cessation counselling

## 2010-06-11 NOTE — Patient Instructions (Signed)
Stop spiriva Start Symbicort two puff twice daily Use avelox one daily until gone, use samples Use Chantix to stop smoking Return 2 months

## 2010-06-20 ENCOUNTER — Telehealth: Payer: Self-pay | Admitting: *Deleted

## 2010-06-20 NOTE — Telephone Encounter (Signed)
Called [facility] at [phone]. They have APPROVED Chantix for 12 weeks starting 06/20/2010 til 09/19/2010. [facility] on [address] made aware.

## 2010-07-15 NOTE — Assessment & Plan Note (Signed)
OFFICE VISIT   Gabrielle [name], Gabrielle [name]  DOB:  17-Sep-1945                                        January 28, 2009  CHART #:  [id number]   HISTORY:  The patient comes in today for a 3-week postoperative follow  up.  She is status post coronary artery bypass grafting x7 on January 01, 2009.  Her postoperative course was complicated by atrial  fibrillation which had converted to normal sinus rhythm at the time of  discharge.  She was also somewhat volume overloaded and went home on  Lasix and potassium.  Overall, she has done well since discharge.  She  states that she did have 1 episode of heart palpitations, however,  this resolved without intervention and she has not noted any more  irregularity since that time.  She is having minimal chest discomfort at  this point and is down to taking her pain medication mainly at night to  help her sleep.  She is planning to enroll in the cardiac rehab program.  She has been staying with her sister since her discharge; however, she  would like to return home at this time.  She saw Dr. Delford Field today for  pulmonary follow up but has not seen Dr. Anne Fu.  Her only issue since  returning home has been some confusion with the directions on her  amiodarone.  Apparently according to her discharge instructions, she was  supposed to take 400 mg b.i.d. x2 weeks, then decrease to 200 mg b.i.d.  However, according to the patient and her sister, she has only been  taking 200 mg daily.  She states that she does have an appointment for  labs at Dr. Anne Fu' office, and she will call as soon as possible to  obtain an appointment to see the physician.   PHYSICAL EXAMINATION:  Vital Signs:  Blood pressure is 133/79, pulse is  64, respirations 16, O2 sat 95% on room air.  Chest:  Her sternal and  right lower extremity EVH incisions have healed well.  Extremities:  She  still has a fair amount of ecchymosis in her right leg but this is  improving.  There is a small area just medial to the EVH incision in her  thigh that is soft and slightly raised and appears to be very small  fluid collection.  The area is soft and nontender and I do not palpate  any fluctuance.  Heart:  Regular rate and rhythm.  Lungs:  Clear to  auscultation.   DIAGNOSTIC TESTS:  Chest x-ray today shows only tiny residual effusions,  otherwise clear.   ASSESSMENT AND PLAN:  The patient is overall doing well status post  coronary artery bypass grafting.  I have encouraged her to make an  appointment as soon as possible to see the cardiologist for followup of  her medication issues.  With regard to her amiodarone, since she appears  to be maintaining sinus rhythm on 200 mg daily, I have advised her to  continue this dosage until she is seen by Dr. Anne Fu.  We have also  encouraged her to proceed with cardiac rehab.  At this point, she may  return for followup on a p.r.n. basis only.  She may resume driving, and  at this point I feel that she is ready to return home with assistance  from her family as needed.   Sheliah Plane, MD  Electronically Signed   GC/MEDQ  D:  01/28/2009  T:  01/29/2009  Job:  [id number]   cc:   Jake Bathe, MD  Charlcie Cradle Delford Field, MD, FCCP

## 2010-07-18 NOTE — H&P (Signed)
Gabrielle [name], Gabrielle [name]            ACCOUNT NO.:  [id number]   MEDICAL RECORD NO.:  [id number]           PATIENT TYPE:   LOCATION:                                 FACILITY:   PHYSICIAN:  Ellwood Dense, M.D.   DATE OF BIRTH:  1945-05-13   DATE OF ADMISSION:  DATE OF DISCHARGE:                                HISTORY & PHYSICAL   DATE OF SURGERY:  April 04, 2004.   CHIEF COMPLAINT:  Right knee osteoarthritis.   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  After a discussion of her  treatment options, risks, benefits, and aftercare, the patient is now  scheduled for total knee arthroplasty of the right knee.  She understands  the risks of infection, stiffness, prosthetic loosening, DVT with PE.  She  opts to proceed, and surgery will go ahead as scheduled.   MEDICAL DOCTOR:  Sharlet Salina, M.D. at [facility].   PAST MEDICAL HISTORY:  Drug allergy to penicillin with swelling and rash.   CURRENT MEDICATIONS:  Cardura, another blood pressure pill, Paxil, Prevacid,  Advair, and albuterol.  The patient did not bring her medicines with her and  is unaware of the dosages.  She will bring all of her medicines with her to  the hospital so we can get proper dosages and medications.   PREVIOUS SURGERIES:  Tonsillectomy and cesarean section x2.   SERIOUS MEDICAL ILLNESSES:  Hypertension, depression, and asthma.   FAMILY HISTORY:  Positive for cancer, coronary artery disease, hypertension,  diabetes.   SOCIAL HISTORY:  The patient is divorced.  She works as a Diplomatic Services operational officer.  She  smokes a half pack a day and does not drink.   REVIEW OF SYSTEMS:  Central nervous system:  Negative for headache, blurry  vision, or dizziness.  Pulmonary:  Positive for exertional shortness of  breath, negative for PND and orthopnea, positive for  asthma.  Cardiovascular:  Negative for  chest pain or palpitations.  Gastrointestinal:  Negative for ulcers or hepatitis. Genitourinary:  Negative for urinary tract difficulty.  Musculoskeletal:  Positive as in  HPI.   PHYSICAL EXAMINATION:  VITAL SIGNS:  BP 160/90, respirations 16, pulse 72  and regular.  GENERAL APPEARANCE:  This is a well-developed, well-nourished lady in no  acute distress.  HEENT:  Head normocephalic.  Nose patent.  Ears patent.  Pupils equal,  round, and reactive to light.  Throat without injection.  NECK:  Supple without adenopathy.  Carotids 2+ without bruit.  CHEST:  Clear to auscultation, no rales or rhonchi.  Respirations 16.  HEART:  Regular rate and rhythm at 72 beats per minute without murmur.  ABDOMEN:  Soft with active bowel sounds, no masses or organomegaly.  NEUROLOGIC:  The patient is alert and oriented to time, place, and person.  Cranial nerves II-XII grossly intact.  EXTREMITIES:  The right knee with 0 degrees to 130 degrees further flexion,  a slight varus deformity.  Neurovascular status  is intact.  Dorsalis pedis  and posterior tibialis pulses are 2+.   X-rays show end-stage osteoarthritis of the right knee.   IMPRESSION:  End-stage osteoarthritis, right knee.   PLAN:  Total knee arthroplasty, right knee.      ________________________________________  Jaquelyn Bitter. Jannette Spanner, P.A.  ___________________________________________  Ellwood Dense, M.D.    SJC/MEDQ  D:  04/21/2004  T:  04/21/2004  Job:  [id number]

## 2010-07-18 NOTE — Discharge Summary (Signed)
Gabrielle [name], Gabrielle [name]            ACCOUNT NO.:  [id number]   MEDICAL RECORD NO.:  [id number]          PATIENT TYPE:  INP   LOCATION:  1506                         FACILITY:  [facility]   PHYSICIAN:  Erasmo Leventhal, M.D.DATE OF BIRTH:  [birth year]   DATE OF ADMISSION:  07/18/2004  DATE OF DISCHARGE:  07/22/2004                                 DISCHARGE SUMMARY   ADMITTING DIAGNOSIS:  End-stage osteoarthritis left knee.   DISCHARGE DIAGNOSIS:  End-stage osteoarthritis left knee.   OPERATION:  Total knee arthroplasty left knee.   BRIEF HISTORY:  This is a 65 year old lady well known to [place] with a history  of previous right total knee arthroplasty with good result who has end-stage  osteoarthritis of the left knee with failure of conservative treatment.  After discussion of the treatment risks, benefits, and options, the patient  was now scheduled for total knee arthroplasty.  She does have significant  pulmonary disease with asthma and some chronic changes.  Her medical doctor  is Crista Luria, M.D., with [facility], and we will get a medical  consult to help manage her pulmonary status postoperatively.   LABORATORY VALUES:  Admission CBC within normal limits, with the exception  of an elevated WBC of 13.6, which she had all throughout her last admission  as well.  Her hemoglobin and hematocrit reached a low of 10.2 and 30.1 on  Jul 21, 2004.  Her white count reached a high of 22.3 on Jul 19, 2004, and  was back down to 15.6 on Jul 21, 2004.  Her chest x-rays showed no acute  disease.  Urinalysis and urine cultures showed no growth, and no site could  be found for her elevated WBC.  She remained mostly afebrile throughout  admission.  PT/PTT were normal on admission.  At discharge, her PT was 16,  with INR 1.5.  Admission CMET within normal limits. She was mildly  hyponatremic on Jul 19, 2004, which was corrected.  She ran mildly elevated  blood glucose through admission,  with a high of 142.   COURSE IN THE HOSPITAL:  The patient tolerated the operative procedure well.  The first postoperative day, vital signs were stable.  She was afebrile.  Her white count was elevated.  No site could be found for this.  Her  dressings were dry.  Drain was removed without difficulty.  I&Os were good.  Calves were negative.  The second postoperative day, the patient continued  to do well.  Vital signs were stable.  She was afebrile.  Dressing was  changed.  Wound was clean.  No evidence of infection.  Calves were negative.  Lungs were clear.  Chest x-ray had been ordered and showed no acute disease.  Urinalysis was negative.  She continued in physical therapy.  Foley was  discontinued.  The third postoperative day, she was feeling better.  Vital  signs were stable.  She had a temperature to a max of 99.0.  O2 saturations  were good on room air at 96.  Hemoglobin and hematocrit were stable at 10.2  and 30.1.  White count continued to  decrease to 15.6.  Her dressing was  changed.  Wound was benign.  Calves negative.  The lungs were clear.  IV was  changed to a __________.  PCA was discontinued.  Lovenox 30 subcutaneously  was added, as her Coumadin was not therapeutic as of yet.  On the fourth  postoperative day, feeling good, stating that she wanted to go home, her  vital signs were stable, she was afebrile, O2 saturations 97 on room air, PT  16, with INR 1.5.  Lungs were clear.  Heart sounds normal.  Bowel sounds  active.  Calves negative.  Dressings changed and the wound benign, and  patient subsequently discharged home for followup in the office.   CONDITION ON DISCHARGE:  Improved.   DISCHARGE MEDICATIONS:  1.  Percocet 1-2 q.4-6 h. p.r.n. pain for three days, then one q.4-6 h.      p.r.n. pain.  2.  Robaxin 500 one p.o. q.8 h. p.r.n. spasm.  3.  Trinsicon one b.i.d.  4.  Coumadin per pharmacy protocol.  5.  Lovenox 40 mg subcutaneously daily until Coumadin  therapeutic.   FOLLOWUP:  In the office in 10 days.   DISCHARGE INSTRUCTIONS:  She is instructed to wear a knee immobilizer when  not exercising.  Do her CPM.  Work at home physical therapy.  Call if any  problems or questions arise.      SJC/MEDQ  D:  07/22/2004  T:  07/22/2004  Job:  [id number]

## 2010-07-18 NOTE — Op Note (Signed)
Gabrielle [name], Gabrielle [name]            ACCOUNT NO.:  [id number]   MEDICAL RECORD NO.:  [id number]          PATIENT TYPE:  INP   LOCATION:  1506                         FACILITY:  [facility]   PHYSICIAN:  Erasmo Leventhal, M.D.DATE OF BIRTH:  1945-08-24   DATE OF PROCEDURE:  07/18/2004  DATE OF DISCHARGE:                                 OPERATIVE REPORT   PREOPERATIVE DIAGNOSIS:  Left knee end-stage osteoarthritis.   POSTOPERATIVE DIAGNOSIS:  Left knee end-stage osteoarthritis.   PROCEDURE:  Left total knee arthroplasty.   SURGEON:  Valma Cava, M.D.   ASSISTANT:  Leilani Able, PA-C.   ANESTHESIA:  Spinal with supplemental general by LMA.   ESTIMATED BLOOD LOSS:  Less than 100 mL.   DRAINS:  Two medium Hemovacs.   COMPLICATIONS:  None.   TOURNIQUET TIME:  1 hour and 30 minutes at 350 mmHg.   COMPLICATIONS:  None.   DISPOSITION:  To PACU stable.   OPERATIVE IMPLANTS:  La[employer] knee, rotating platform, all  cemented.  Size 2.5 femur, 2.5 tibia, 32 mm patella, 10 mm rotating platform  posterior stabilized tibial insert.   OPERATIVE DETAILS:  The patient was counseled in the holding area; correct  site was identified.  IV was started, and antibiotics were given.  Taken to  the OR, and the spinal was administered.  Foley catheter was placed  utilizing sterile technique by the OR circulating nurse.  All extremities  were well-padded and bumped.  Left lower extremity was examined.  She had  about a 3-degree flexion contracture, flexed to 125 degrees, elevated,  prepped with DuraPrep, and draped in a sterile fashion, exsanguinated with  Esmarch.  Tourniquet was inflated to 350 mmHg.  A straight midline incision  was made through the skin and subcutaneous tissue.  Medial and lateral soft  tissue flaps were developed.  Medial parapatellar arthrotomy was performed,  and a proximal and medial tibia soft tissue release was done due to her  varus knee.  The patella  was everted; the knee was flexed, end-stage  arthritic change bone-against-bone.  The cruciate ligaments were resected,  starting hole made in the distal femur; canal was irrigated until the  effluent was clear.  Intramedullary rod was gently placed.  I chose to take  a 5-degree valgus cut with a 10 mm cut off the distal femur.  The distal  femur was found to be a size 2.5.  Rotational marks were made.  The distal  femur cutting block was applied.  Distal femur was cut to fit for a 2.5.  Medial and lateral menisci were removed.  Geniculate vessels were  coagulated.  The posterior neurovascular structures were fought off and  protected throughout the entire case.  Tibial eminence was resected;  osteophytes were removed across the proximal tibia.  The tibial eminence was  resected.  Osteophytes removed across the medial tibia.  The tibia was found  to be a size 2.5.  Central aspect was found.  A starting hole was made.  Step reamer was utilized.  Canal was irrigated until the effluent was clear.  Intramedullary rod was  gently placed.  I chose to take a 10 mm cut based  upon the lateral side at a 0-degree slope.  Posteromedial and posterolateral  femoral osteophytes were removed under direct visualization.  Femoral box  was prepared in a standard fashion.  At this time, we were tight in flexion.  We were tight in extension.  Therefore, another 2 mm across the tibia was  removed.  At this time, we had well-balanced flexion and extension gaps with  10 mm insert.  She was stable to varus and valgus stress, and the knee was  well-aligned.   Patella was found to be a size 32.  We resected 8 mm.  Locking holes were  made and with a 32 mm tibial button, we had excellent range of motion, soft  tissue balance.  Patellofemoral tracking was anatomic.  The tibial keel  punch was then performed in the standard fashion.   Utilizing pulsatile lavage, the knee was then copiously irrigated.  Utilizing  modern cement, all components were cemented into place, size 2.5  tibia, size 2.5 femur, with a 32 patella.  After the cement had cured with  the 10 mm trial, we had excellent flexion and extension, well-balanced in  varus valgus stress.  Patellofemoral tracking was anatomic, and reflection  and extension gaps were well-balanced.  Trial was removed; excess cement was  removed, and then the final 10 mm thick posterior stabilized rotating  platform tibial insert was implanted.  The knee was then again put through a  range of motion.  She was well-balanced and an excellent stable knee and  well-aligned knee.  The wounds were copiously irrigated several times during  the closure.  Two medium Hemovac drains were placed.  A sequential closure  of layers was done.  Arthrotomy with Vicryl, subcu Vicryl, skin closed with  subcuticular Monocryl suture.  Steri-Strips were applied.  Xeroform around  the drain; drain hooked to suction.  Sterile compressive wrap was applied to  the knee.  Tourniquet was deflated.  Another gram of Ancef given  intravenously.  Sponge and needle counts were correct.  There were no  complications or problems, and then she was gently awakened.  She was taken  from the operating room to PACU in stable condition.  During the procedure  initially, she was coughing quite a bit, and it was decided by the  anesthesiologist to go ahead and put an LMA anesthetic for the patient.   The patient tolerated the procedure well.  There were no complications.  Sponge and needle count were correct.   To help with technical assistance throughout the entire surgical procedure,  Mr. Layla Maw, PA-C assistance was needed.      RAC/MEDQ  D:  07/18/2004  T:  07/18/2004  Job:  [id number]

## 2010-07-18 NOTE — Consult Note (Signed)
NAMEBASSHEVA, FLURY            ACCOUNT NO.:  [id number]   MEDICAL RECORD NO.:  [id number]          PATIENT TYPE:  INP   LOCATION:  1506                         FACILITY:  [facility]   PHYSICIAN:  Corinna L. Lendell Caprice, MDDATE OF BIRTH:  [birth year]   DATE OF CONSULTATION:  DATE OF DISCHARGE:                                   CONSULTATION   REFERRED BY:  Erasmo Leventhal, M.D.   REASON FOR CONSULTATION:  Asthma, hypertension, reflux.   IMPRESSIONS/RECOMMENDATIONS:  1.  Asthma, stable. I agree with albumin MDI's. I will also order a cough      suppressant for allergies and cough.  2.  Gastroesophageal reflux disease. Agree with proton pump inhibitor.  3.  Hypertension. Agree with Cardizem and doxazosin.  4.  Left total knee replacement. Agree with DVT prophylaxis.   HISTORY OF PRESENT ILLNESS:  Gabrielle [name] is a 65 year old white female  patient who underwent a left total knee replacement today. Her primary care  physician is Sharlet Salina, M.D.  I have been asked to consult  postoperatively for the above problems. Currently she is complaining of a  lot of pain and is asking for her morphine dose to be increased. She reports  that she had a cough and worsening allergies since she cut her nose hairs  last week. She reports that this always happens when she does so. She has  been taking cough suppressants and has been taking her inhalers more  frequently. She has had no fevers or chills, her cough is nonproductive.   PAST MEDICAL HISTORY:  As above.   MEDICATIONS:  1.  Albuterol MDI 2 puffs t.i.d.  2.  Morphine PCA.  3.  Coumadin per pharmacy protocol.  4.  Doxazosin 8 mg p.o. q.h.s.  5.  Cardizem 360 mg p.o. q.d.  6.  Prevacid 30 mg a day.  7.  Paxil 20 mg a day.   SOCIAL AND FAMILY HISTORY:  Reviewed and as per H&P.   REVIEW OF SYMPTOMS:  As above otherwise negative.   PHYSICAL EXAMINATION:  VITAL SIGNS:  Her oxygen saturation is 98% on 2  liters nasal cannula. Pulse  is 76, respiratory rate 18. Blood pressure and  temperature not recorded.  GENERAL:  The patient is anxious appearing but somewhat somnolent white  female.  HEENT:  Normocephalic, atraumatic. Pupils equal round and reactive to light.  Moist mucous membranes. Oropharynx is without erythema or exudate.  NECK:  Supple, no lymphadenopathy. No JVD.  LUNGS:  Clear to auscultation bilaterally without wheezes, rhonchi or rales.  CARDIOVASCULAR:  Regular rate and rhythm without murmur, gallop or rub.  ABDOMEN:  Obese, soft, nontender.  GU/RECTAL:  Deferred.  EXTREMITIES:  She has a brace on her left leg. Her right leg has TED hose  and a foot pump.  NEUROLOGIC:  The patient is oriented. Sensory and motor exam are intact.  PSYCHIATRIC:  Somewhat anxious.   LABORATORY DATA:  From May 12, her white blood cell count is 13,000  otherwise unremarkable. Complete metabolic panel unremarkable. UA negative.  INR normal. PTT normal. Two views of the chest from  April 25, 2004 showed  chronic bronchitis versus asthma, small bilateral effusions, nothing active.   I would like to thank Dr. Thomasena Edis for this consultation.      CLS/MEDQ  D:  07/18/2004  T:  07/18/2004  Job:  [id number]   cc:   Sharlet Salina, M.D.  [address]  [place]  [place] [address]  Fax: [phone]

## 2010-07-18 NOTE — H&P (Signed)
[name], [name]            ACCOUNT NO.:  [id number]   MEDICAL RECORD NO.:  [id number]          PATIENT TYPE:   LOCATION:                                 FACILITY:   PHYSICIAN:  Erasmo Leventhal, M.D.DATE OF BIRTH:  1945-05-07   DATE OF ADMISSION:  07/18/2004  DATE OF DISCHARGE:                                HISTORY & PHYSICAL   CHIEF COMPLAINT:  Left knee end-stage osteoarthritis.   HISTORY OF PRESENT ILLNESS:  This is a 65 year old lady, well known to [place]  with history of previous right total knee arthroplasty with good result who  has had end-stage osteoarthritis of her left knee with failure of  conservative Treatment.  Due to her continued pain and inability to do daily  activities without discomfort, she is now scheduled for total knee  arthroplasty of the left knee.  The surgery, risks, benefits, and aftercare  were discussed in detail with the patient.  These include blood loss,  infection, stiffness, loosening of the prosthesis, DVT with PE and were all  discussed in detail with the patient.  Questions were invited and answered,  and she wishes to proceed with surgery.  Note:  Her medical doctor is Dr.  Sharlet [name] with [facility].   PAST MEDICAL HISTORY:   DRUG ALLERGIES:  1.  PENICILLIN with a rash.  2.  NOVOCAIN with tachycardia.   CURRENT MEDICATIONS:  Paxil, Prevacid, Cardura, another blood pressure  medication, and albuterol.  She did not bring the medicines or dosages with  her today.  She will bring these to the hospital with her.   PAST SURGICAL HISTORY:  1.  C section x 2.  2.  Tonsillectomy.   SERIOUS MEDICAL ILLNESSES:  1.  Hypertension.  2.  Reflux.  3.  Asthma.   SOCIAL HISTORY:  The patient is divorced.  She lives at home.  She smokes  one-half pack a day and does not drink.   FAMILY HISTORY:  Positive for coronary artery disease, hypertension,  diabetes, and cancer.   REVIEW OF SYSTEMS:  CENTRAL NERVOUS SYSTEM:  Negative for  headache, blurred  vision, or dizziness. PULMONARY:  Positive for history of asthma with  occasional shortness of breath.  Negative PND or orthopnea.  CARDIOVASCULAR:  Negative for chest pain or palpitations.  Positive for hypertension.  GI:  Positive for GERD, negative for ulcers or hepatitis.  GU:  Positive for  kidney stones with a retained stone.   PHYSICAL EXAMINATION:  VITAL SIGNS:  Blood pressure 95/60, respirations 18,  pulse 72 and regular.  GENERAL APPEARANCE:  This is a well-developed, well-nourished lady in no  acute distress.  HEENT:  Head normocephalic.  Nose patent, ears patent.  Pupils equal, round,  and reactive to light.  Throat without injection.  NECK:  Supple without adenopathy.  Carotids 2+ without bruit.  CHEST:  Clear to auscultation.  No rales or rhonchi.  Respirations 18.  HEART:  Regular rate and rhythm at 72 beats per minute without murmur.  ABDOMEN:  Soft with active bowel sounds.  No masses or organomegaly.  NEUROLOGIC:  The patient alert  and oriented to time, place, and person.  Cranial nerves II-XII grossly intact.  EXTREMITIES:  The right knee is status post total knee arthroplasty with 0  to 115 degree range of motion.  The left knee shows a varus deformity,  crepitation throughout the range of motion.  Neurovascular status is intact.  Dorsalis pedis and posterior tibialis pulses are 2+.   LABORATORY DATA AND OTHER STUDIES:  X-rays show end-stage osteoarthritis  left knee.   IMPRESSION:  End-stage osteoarthritis left knee.   PLAN:  Total knee arthroplasty, left knee.      SJC/MEDQ  D:  07/09/2004  T:  07/09/2004  Job:  [id number]

## 2010-07-18 NOTE — Procedures (Signed)
[facility]  Patient:    Gabrielle [name], Gabrielle [name]                     MRN: [id number] Proc. Date: 02/20/00 Adm. Date:  [id number] Disc. Date: [id number] Attending:  Nelda Marseille CC:         [name]. Lenon Ahmadi, M.D.   Procedure Report  PROCEDURE:  Colonoscopy with biopsy.  INDICATIONS FOR PROCEDURE:  Diarrhea, guaiac positivity. She has not had any previous colonic screening.  Consent was signed after risks, benefits, methods, and options were thoroughly discussed.  MEDICINES USED:  Demerol 100, Versed 10.  DESCRIPTION OF PROCEDURE:  Rectal inspection was pertinent for external hemorrhoids. Digital exam was negative. The video colonoscope was inserted and easily advanced around the colon to the cecum. This did not require any abdominal pressure or any position changes. The cecum was identified by the appendiceal orifice and the ileocecal valve. In fact, the scope was inserted a short ways into the terminal ileum. Photo documentation was obtained. Scattered biopsies were obtained, put in the first container, and the scope was slowly withdrawn. No significant abnormality, colitis, or other findings were seen on insertion. On slow withdrawal in the cecum, a small 2-3 mm polyp was seen and was cold biopsied x 3 and put a second container. The scope was slowly withdrawn. The prep was adequate. There was a fair amount of liquid stool that required a fair amount of washing and suctioning and random biopsies of the colon were obtained but no signs of colitis, inflammation or other abnormalities were seen as we slowly withdrew back to the rectum. There was an occasional left sided diverticula seen. In the distal sigmoid, a small hyperplastic appearing polyp was seen and was cold biopsied x 1 and put in a separate container. The random biopsies of the colon were put in container #3. Once back in the rectum, the scope was retroflexed pertinent for some  internal hemorrhoids. The scope was straightened. Anal rectal pull through confirmed the above. The scope was reinserted a short ways up the sigmoid, air was suctioned, the scope removed. The patient tolerated the procedure well. There was no obvious or immediate complication.  ENDOSCOPIC DIAGNOSIS: 1. Internal/external hemorrhoids, small. 2. Occasional left sided diverticula. 3. Two tiny to small polyps in the distal sigmoid and the cecum status post    cold biopsy. 4. Otherwise within normal limits to the terminal ileum status post random    biopsies throughout.  PLAN:  Await pathology, continue workup with an endoscopy and will see back in 6-8 weeks to recheck. Future screening pending polyp path and symptoms. DD: 02/20/00 TD:  02/22/00 Job: [id number]

## 2010-07-18 NOTE — Consult Note (Signed)
NAMESHALONDA, [name]            ACCOUNT NO.:  [id number]   MEDICAL RECORD NO.:  [id number]          PATIENT TYPE:  INP   LOCATION:  X009                         [facility]ACILITY[facility]   PHYSICIAN:  Theone Stanley, MD   DATE OF BIRTH:  1945-08-22   DATE OF CONSULTATION:  05/01/2004  DATE OF DISCHARGE:                                   CONSULTATION   REASON FOR CONSULTATION:  Management of hypertension and asthma  postoperatively.   HISTORY OF PRESENT ILLNESS:  [name] is a 66 year old female  with past medical history of asthma which apparently was diagnosed as an  adult and history of pneumonia and hypertension.  She presents today for  elective total knee arthroplasty secondary to osteoarthritis.  In regards to  her asthma, as stated above, she was diagnosed later in life.  She has never  been hospitalized for it and never has come to the emergency room for it.  She takes her albuterol p.r.n.  She has Advair which she states makes her  crazy.  She has history of hypertension which is longstanding and she is  managed by her primary care physician.   PAST MEDICAL HISTORY:  1.  Asthma.  2.  History of pneumonia.  3.  Hypertension.   MEDICATIONS:  1.  Diltiazem 360 mg daily.  2.  Advair 100/50 one puff q.12h. p.r.n.  3.  Prevacid 30 mg daily.  4.  doxizosin 8 mg one p.o. at night.  5.  Paxil 20 mg daily.  6.  Albuterol two puffs p.r.n.   ALLERGIES:  1.  PENICILLIN, hives.  2.  NOVOCAIN, palpitations.   SOCIAL HISTORY:  She lives in [place].  Smokes one pack per day for  greater than 30 years.  No alcohol or illicit drug use.   FAMILY HISTORY:  Noncontributory.   REVIEW OF SYSTEMS:  As noted in HPI; otherwise negative.   HISTORY AND PHYSICAL:  VITAL SIGNS:  Blood pressure 123/78, heart rate 52,  saturation 98% on 2 L, respiratory rate 16.  HEENT:  Atraumatic, normocephalic.  Eyes:  Nonicteric.  Pupils equal and  reactive to light.  Extraocular muscles  were intact.  NECK:  Supple.  No lymphadenopathy.  HEART:  Regular rate and rhythm.  No murmurs, rubs or gallops appreciated.  LUNGS:  Clear to auscultation bilaterally.  ABDOMEN:  Obese, soft, nontender, nondistended.  EXTREMITIES:  The patient's right knee was bandaged.  The left leg showed no  edema, cyanosis or clubbing.   LABORATORY DATA:  Please see computer.   ASSESSMENT/PLAN:  1.  Hypertension.  Will continue with her diltiazem and cardura .  Use      hydralazine p.r.n. for blood pressures greater than 160.  May need      consultation with pain management.  This will cause increase in her      blood pressure and this needs to be well controlled.  2.  Asthma.  Although the patient takes albuterol on a p.r.n. basis, while      here in the hospital will suggest just scheduling her albuterol and hold  off giving the Advair since the patient thinks this makes her very      agitated.  However, we can always add this at a later time.   Thank you for this consultation.      AEJ/MEDQ  D:  05/01/2004  T:  05/02/2004  Job:  [id number]   cc:   Erasmo Leventhal, M.D.  [facility]  [address]  [address]  [place]  [place] [address]  Fax: [phone]

## 2010-07-18 NOTE — Procedures (Signed)
[facility]  Patient:    Gabrielle [name], Gabrielle [name]                     MRN: [id number] Proc. Date: 02/20/00 Adm. Date:  [id number] Disc. Date: [id number] Attending:  Nelda Marseille CC:         [name]. Lenon Ahmadi, M.D.   Procedure Report  PROCEDURE:  Esophagogastroduodenoscopy with biopsy.  INDICATIONS FOR PROCEDURE:  A patient with guaiac positivity, dysphagia and some upper tract symptoms, ______ Prevacid, nondiagnostic colonoscopy.  Consent was signed prior to any premeds given after risks, benefits, methods, and options were thoroughly discussed in the office and before this procedure. Additional medicines for this procedure since it followed the colonoscopy was 2.5 of Versed only.  DESCRIPTION OF PROCEDURE:  The video endoscope was inserted by direct vision. A quick look at the posterior pharynx as well as the esophagus was normal. There was a possible tiny hiatal hernia. The scope passed into the stomach where some minimal gastritis, antritis was seen, advanced through a normal pylorus into a normal duodenal bulb and around the C loop to a normal second portion of the duodenum. The scope was withdrawn back to the bulb and a good look there ruled out ulcers in that location. The scope was withdrawn back to the stomach and retroflexed. The angularis, cardia and fundus as well as her greater curve were normal except for the minimal gastritis. The scope was straightened. The scope was straightened and straight visualization in the stomach confirmed the above the findings without any additional findings. The scope was then advanced to the antrum. One biopsy for the CLOtest to rule out Helicobacter was obtained, air was suctioned, scope slowly withdrawn. Again a good look at the esophagus on slow withdrawal was normal without signs of Barretts or esophagitis. The scope was removed. The patient tolerated the procedure well and there was no obvious or immediate  complication.  ENDOSCOPIC DIAGNOSIS: 1. Tiny hiatal hernia. 2. Very minimal gastritis, antritis status post CLO biopsy to rule out    Helicobacter. 3. Otherwise within normal limits endoscopy.  PLAN:  Follow-up p.r.n. or in 6-8 weeks to recheck guaiac symptoms, possibly CBC and decide any other workup and plan. See colon dictation for further findings and recommendations. DD:  02/20/00 TD:  02/22/00 Job: [id number]

## 2010-07-18 NOTE — Discharge Summary (Signed)
Gabrielle [name], Gabrielle [name]            ACCOUNT NO.:  [id number]   MEDICAL RECORD NO.:  [id number]          PATIENT TYPE:  INP   LOCATION:  0468                         FACILITY:  [facility]   PHYSICIAN:  Erasmo Leventhal, M.D.DATE OF BIRTH:  12-Jan-1946   DATE OF ADMISSION:  05/01/2004  DATE OF DISCHARGE:  05/06/2004                                 DISCHARGE SUMMARY   ADMISSION DIAGNOSIS:  End-stage osteoarthritis, right knee.   DISCHARGE DIAGNOSIS:  End-stage osteoarthritis, right knee.   OPERATION:  Total knee arthroplasty, right knee.   BRIEF HISTORY:  This is a 65 year old lady with history of end-stage  osteoarthritis who has failed all conservative measures to control her  symptoms.  After discussion of treatment options, risks, benefits, and  aftercare, the patient was scheduled for total knee arthroplasty.   LABORATORY VALUES:  Admission CBC showed the white count elevated at 13.0,  otherwise within normal limits.  Her white count remained moderately  elevated with a high of 16.7 throughout the admission.  Hemoglobin and  hematocrit reached a low of 9.8 and 29.1, respectively, on May 04, 2004.  Her PT and PTT were within normal limits.  By discharge, PT was 19.8 with  INR of 2.1.  Admission BMET showed glucose high at 105.  She had an episode  of hypokalemia which was treated by the medical service and got down to 2.8  and was back to 3.4 by discharge.  She remained mildly hyperglycemic  throughout admission with a high of 149.   COURSE IN THE HOSPITAL:  The patient tolerated the operative procedure well.  Medical consult was obtained with [facility] to manage her diabetes,  asthma, and hypertension.   On the first postoperative day, she was in moderate pain.  Vital signs were  stable.  Temperature to a maximum of 100.8.  Hemoglobin and hematocrit were  stable at 11.8 and 34.2, respectively. WBC was high at 16.7.  BMET showed  slightly low potassium.  Her drain was  removed.  I's and O's were good.  Lungs were clear with scattered wheezes.  Heart sounds were normal.  Bowel  sounds active.  CMP and PT were started.  Dressing change for the morning.  The patient was told to increase her use of incentive spirometry.  Medicine  followed the patient for her  blood pressure and diabetes.   On the second postoperative day, vital signs remained stable.  Neurovascular  status was stable.  Dressing was changed, and wound looked good.  No  evidence of infection.  Calves were negative.  She was to continue with  physical therapy.   On postoperative day #3, vital signs were stable.  She was afebrile.  Hemoglobin 9.8.  Potassium was low at 2.8 and being managed by the medical  service. Dressing was changed.  Wound was benign.  Calves were negative.  She continued physical therapy and medical management of hypokalemia.   On the fourth postoperative day, vital signs were stable.  No shortness of  breath, no chest pain, no calf tenderness.  She was afebrile.  Her wound was  clean.  No  evidence of DVT, and discharge plans were made for discharge the  following morning if okay with medical service.   On postop day #5, vital signs stable, afebrile.  Wound looking good. Calves  negative.  Neurovascular status intact.  INR 2.1.  The patient was  subsequently discharged home for followup in the office.   CONDITION ON DISCHARGE:  Improved.   DISCHARGE MEDICATIONS:  1.  Coumadin per pharmacy protocol.  2.  Percocet.  3.  Robaxin.  4.  Trinsicon.  5.  Her usual home medication and medicines per Dr. Nehemiah Settle.   She will follow up with [place] in 10 days.  She will follow up with her medical  doctors per Dr. Idelle Crouch instructions.      SJC/MEDQ  D:  06/11/2004  T:  06/11/2004  Job:  [id number]   cc:   Deirdre Peer. Polite, M.D.

## 2010-07-18 NOTE — Op Note (Signed)
NAMESHAUNTAVIA, BRACKIN                        ACCOUNT NO.:  [id number]   MEDICAL RECORD NO.:  [id number]                   PATIENT TYPE:  AMB   LOCATION:  ENDO                                 FACILITY:  [facility]   PHYSICIAN:  Petra Kuba, M.D.                 DATE OF BIRTH:  1945-07-13   DATE OF PROCEDURE:  08/13/2003  DATE OF DISCHARGE:                                 OPERATIVE REPORT   PROCEDURE:  Colonoscopy with polypectomy.   ENDOSCOPIST:  Petra Kuba, M.D.   INDICATION:  The patient with history of colon polyps, due to repeat  screening.  Consent was signed after risks, benefits, methods, and options  were thoroughly discussed multiple times in the past.   MEDICINES USED:  Demerol 100, Versed 10.   DESCRIPTION OF PROCEDURE:  Rectal inspection was pertinent for external  hemorrhoids, small.  Digital exam was negative.  The videocolonoscope was  inserted.  Unfortunately, in the mid-sigmoid, could not advance past the  tortuous turn.  We did try abdominal pressure and rolling her on her back  without success.  We elected to withdraw.  A few left-sided diverticular  were seen as was a small rectal polyp.  We went ahead and removed this scope  and inserted the pediatric video adjustable colonoscope, and it was much  easier to advanced around the colon the cecum which did not require any  abdominal pressure of any position changes.  No additional findings were  seen on insertion. The cecum was identified by the appendiceal orifice and  the ileocecal valve.  The prep was fairly adequate, did require a liter of  washing and suctioning for adequate visualization.  In the cecum along a  fold was a small polyp which was hot biopsied x2.  It was put in the first  container.  The scope was slowly withdrawn.  There were a few scattered left-  sided diverticula, mostly in the sigmoid but no other abnormalities as we  withdrew back to the rectum where two small polyps were seen, snared,  electrocautery applied, and the polyps were suctioned through the scope and  collected in the trap and put in a second container.  Another tiny-to-small  polyp was seen and was hot biopsied in the rectum, put in the same  container.  Anorectal pull through on retroflexion confirmed some small  hemorrhoids.  The scope was reinserted a short ways up the left side of the  colon.  Air was suctioned and the scope removed.  The patient tolerated the  procedure well.  There was no obvious immediate complication.   ENDOSCOPIC DIAGNOSES:  1. Internal and external hemorrhoids.  2. Left occasional diverticula.  3. Three small rectal polyps, two snared, one hot biopsied.  4. Cecal small polyp hot biopsied and put in a separate container.  5. Otherwise within normal limits to the cecum.   PLAN:  Await pathology to  determine future colonic screening.  Happy to see  back p.r.n., otherwise return care to Dr. Marny Lowenstein for the customary  healthcare maintenance to include yearly rectals and guaiacs.                                               Petra Kuba, M.D.    MEM/MEDQ  D:  08/13/2003  T:  08/13/2003  Job:  [id number]   cc:   Sharlet Salina, M.D.  [address]  [place]  [place] [address]  Fax: [phone]

## 2010-07-18 NOTE — Op Note (Signed)
Gabrielle [name], [name]            ACCOUNT NO.:  [id number]   MEDICAL RECORD NO.:  [id number]          PATIENT TYPE:  INP   LOCATION:  X009                         FACILITY:  [facility]   PHYSICIAN:  Erasmo Leventhal, M.D.DATE OF BIRTH:  1946/02/07   DATE OF PROCEDURE:  05/01/2004  DATE OF DISCHARGE:                                 OPERATIVE REPORT   PREOPERATIVE DIAGNOSES:  Right knee end-stage osteoarthritis.   POSTOPERATIVE DIAGNOSES:  Right knee end-stage osteoarthritis.   PROCEDURE:  Right total knee arthroplasty.   SURGEON:  Erasmo Leventhal, M.D.   ASSISTANT:  Jaquelyn Bitter. Chabon, P.A.   ANESTHESIA:  Spinal.   ESTIMATED BLOOD LOSS:  Less than 100 mL.   DRAINS:  Two medium Hemovac.   COMPLICATIONS:  None.   TOURNIQUET TIME:  An hour and 50 minutes at 350 mmHg.   COMPLICATIONS:  None.   DISPOSITION:  To PACU stable.   OPERATIVE IMPLANTS:  Depuy orthopedics. Oval dome size 35 mm patella, size  2.5 right PFC sigma knee, size 2.5, 10 mm rotating platform tibial insert,  all cemented.   DESCRIPTION OF PROCEDURE:  The patient was counseled in the holding area,  correct side was identified, chart was reviewed and signed appropriately.  Taken to the OR where the spinal anesthetic was administered. A Foley  catheter was placed utilizing sterile technique by the OR circulating nurse.  Extremities were well padded and bumped. The leg was elevated, she had about  a 70 degree flexion contracture, she could flex 125 degrees, elevated,  prepped with Duraprep and draped in a sterile fashion.  Exsanguinated with  Esmarch, tourniquet was inflated to 350 mmHg. She had some what she called a  neurotic or anxiety induced itching which she would scratch herself. We made  sure [name] was placed everywhere and the surgical incision was not taken  through one of her areas where she scratches herself. She was also given 2 g  of Ancef intravenously prior to tourniquet inflation and  allowed this to  have time to circulate for several minutes. A straight midline incision was  made in the skin and subcutaneous tissue, medial and lateral soft tissue  flaps were developed at the appropriate level, medial parapatellar  arthrotomy was performed, proximal medial soft tissue release was done,  patella was everted, knee was flexed, end-stage arthritic change. The  cruciate ligaments were resected. A starting hole was made in the distal  femur, canal was irrigated until the effluent was clear. The intramedullary  rod was gently placed. I chose a 5 degree valgus cut and took an 11 mm cut  off the distal femur. The distal femur was found to be a size 2.5,  rotational marks were made and distal femur was cut to fit a size 2.5 with  the appropriate external rotation.  Medial and lateral menisci were removed  under direct visualization. Geniculate vessels were coagulated, posterior  neurovascular structures were thought of and protected throughout the entire  case. Osteophytes removed from the proximal medial tibial, tibial eminence  was resected, proximal tibia found to be a size 2.5.  The  center of the  tibia was found, starting hole was made, step reamer was utilized, canal was  irrigated, the effluent was clear and intramedullary rod was gently dropped.  I chose a zero degree slope with a 10 mm cut based upon the lateral side off  the proximal tibia given a nice resection. Posteromedial and __________  femoral osteophytes removed under direct visualization.   A femoral box cut was then performed in standard fashion being very careful.  At this time, the size 2.5 femur, size 2.5 tibia with a 10 mm rotating  platform insert, we had excellent range of motion and soft tissue balance.  She had excellent flexion extension gaps also.  The patella was found to be  22 mm thick. We did a resurfacing of the patella and took off 8 mm leaving  it 14 mm thick found to be a size 35, locking  pegs were made. The knee was  then flexed and the tibial keel was then performed in standard fashion. At  this time, the knee was irrigated copiously with pulsatile lavage.  Utilizing modern cement technique, all components were cemented into place.  A size 2.5 rotating platform tibia, size 2.5 sigma femur, 35 mm patella.  After the cement had cured with a 10 mm insert, we had excellent flexion  extension gap, patellofemoral tracking was anatomic and she was well  balanced with excellent range of motion and varus and valgus stability.  When the trial was removed, excess cement was removed and a final 2.5, 10 mm  rotating platform tibial insert was implanted, knee was checked several  range of motion, very stable, excellent position and balance.   Irrigated several times during the closure, arthrotomy and soft tissue, two  medium Hemovac drains were placed.  A sequential closure of layers were  done, arthrotomy Vicryl, subcu Vicryl, skin closed with staples. A sterile  compressive dressing applied, tourniquet was deflated, another gram of Ancef  given intravenously. She tolerated the procedure well, there were no  complications.  Sponge and needle count were correct.  She was taken from  the operating room to PACU in stable condition.   To help throughout this entire surgical procedure, Mr. Brett Canales Chabon's  assistance was definitely needed for this difficult case. The patient has a  large body habitus.  The patient was taken from the operating room to PACU  in stable condition.      RAC/MEDQ  D:  05/01/2004  T:  05/01/2004  Job:  [id number]

## 2010-08-11 ENCOUNTER — Ambulatory Visit: Payer: Medicare Other | Admitting: Critical Care Medicine

## 2010-08-19 ENCOUNTER — Other Ambulatory Visit: Payer: Self-pay | Admitting: Family Medicine

## 2010-08-19 DIAGNOSIS — Z1231 Encounter for screening mammogram for malignant neoplasm of breast: Secondary | ICD-10-CM

## 2010-12-01 ENCOUNTER — Emergency Department (HOSPITAL_COMMUNITY)
Admission: EM | Admit: 2010-12-01 | Discharge: 2010-12-01 | Disposition: A | Discharge status: Home / Self Care | Payer: Medicare Other | Attending: Emergency Medicine | Admitting: Emergency Medicine

## 2010-12-01 ENCOUNTER — Emergency Department (HOSPITAL_COMMUNITY): Payer: Medicare Other

## 2010-12-01 DIAGNOSIS — J449 Chronic obstructive pulmonary disease, unspecified: Secondary | ICD-10-CM | POA: Insufficient documentation

## 2010-12-01 DIAGNOSIS — Z951 Presence of aortocoronary bypass graft: Secondary | ICD-10-CM | POA: Insufficient documentation

## 2010-12-01 DIAGNOSIS — N200 Calculus of kidney: Secondary | ICD-10-CM | POA: Insufficient documentation

## 2010-12-01 DIAGNOSIS — F3289 Other specified depressive episodes: Secondary | ICD-10-CM | POA: Insufficient documentation

## 2010-12-01 DIAGNOSIS — G8929 Other chronic pain: Secondary | ICD-10-CM | POA: Insufficient documentation

## 2010-12-01 DIAGNOSIS — J4489 Other specified chronic obstructive pulmonary disease: Secondary | ICD-10-CM | POA: Insufficient documentation

## 2010-12-01 DIAGNOSIS — I2510 Atherosclerotic heart disease of native coronary artery without angina pectoris: Secondary | ICD-10-CM | POA: Insufficient documentation

## 2010-12-01 DIAGNOSIS — M129 Arthropathy, unspecified: Secondary | ICD-10-CM | POA: Insufficient documentation

## 2010-12-01 DIAGNOSIS — I4891 Unspecified atrial fibrillation: Secondary | ICD-10-CM | POA: Insufficient documentation

## 2010-12-01 DIAGNOSIS — M1990 Unspecified osteoarthritis, unspecified site: Secondary | ICD-10-CM | POA: Insufficient documentation

## 2010-12-01 DIAGNOSIS — E785 Hyperlipidemia, unspecified: Secondary | ICD-10-CM | POA: Insufficient documentation

## 2010-12-01 DIAGNOSIS — I10 Essential (primary) hypertension: Secondary | ICD-10-CM | POA: Insufficient documentation

## 2010-12-01 DIAGNOSIS — E039 Hypothyroidism, unspecified: Secondary | ICD-10-CM | POA: Insufficient documentation

## 2010-12-01 DIAGNOSIS — M545 Low back pain, unspecified: Secondary | ICD-10-CM | POA: Insufficient documentation

## 2010-12-01 DIAGNOSIS — F329 Major depressive disorder, single episode, unspecified: Secondary | ICD-10-CM | POA: Insufficient documentation

## 2010-12-01 DIAGNOSIS — Z7901 Long term (current) use of anticoagulants: Secondary | ICD-10-CM | POA: Insufficient documentation

## 2010-12-01 DIAGNOSIS — Z79899 Other long term (current) drug therapy: Secondary | ICD-10-CM | POA: Insufficient documentation

## 2010-12-01 LAB — POCT I-STAT, CHEM 8
BUN: 18 mg/dL (ref 6–23)
Calcium, Ion: 1.21 mmol/L (ref 1.12–1.32)
Chloride: 101 mEq/L (ref 96–112)
Creatinine, Ser: 1.2 mg/dL — ABNORMAL HIGH (ref 0.50–1.10)
Glucose, Bld: 109 mg/dL — ABNORMAL HIGH (ref 70–99)
HCT: 44 % (ref 36.0–46.0)
Hemoglobin: 15 g/dL (ref 12.0–15.0)
Potassium: 4.4 mEq/L (ref 3.5–5.1)
Sodium: 136 mEq/L (ref 135–145)
TCO2: 25 mmol/L (ref 0–100)

## 2010-12-01 LAB — DIFFERENTIAL
Basophils Absolute: 0 10*3/uL (ref 0.0–0.1)
Basophils Relative: 0 % (ref 0–1)
Eosinophils Absolute: 0.1 10*3/uL (ref 0.0–0.7)
Eosinophils Relative: 1 % (ref 0–5)
Lymphocytes Relative: 14 % (ref 12–46)
Lymphs Abs: 1.7 10*3/uL (ref 0.7–4.0)
Monocytes Absolute: 0.3 10*3/uL (ref 0.1–1.0)
Monocytes Relative: 3 % (ref 3–12)
Neutro Abs: 10 10*3/uL — ABNORMAL HIGH (ref 1.7–7.7)
Neutrophils Relative %: 82 % — ABNORMAL HIGH (ref 43–77)

## 2010-12-01 LAB — URINALYSIS, ROUTINE W REFLEX MICROSCOPIC
Glucose, UA: NEGATIVE mg/dL
Hgb urine dipstick: NEGATIVE
Ketones, ur: 15 mg/dL — AB
Nitrite: NEGATIVE
Protein, ur: NEGATIVE mg/dL
Specific Gravity, Urine: 1.024 (ref 1.005–1.030)
Urobilinogen, UA: 0.2 mg/dL (ref 0.0–1.0)
pH: 5 (ref 5.0–8.0)

## 2010-12-01 LAB — CBC
HCT: 40.6 % (ref 36.0–46.0)
Hemoglobin: 14 g/dL (ref 12.0–15.0)
MCH: 32.6 pg (ref 26.0–34.0)
MCHC: 34.5 g/dL (ref 30.0–36.0)
MCV: 94.6 fL (ref 78.0–100.0)
Platelets: 220 10*3/uL (ref 150–400)
RBC: 4.29 MIL/uL (ref 3.87–5.11)
RDW: 14.1 % (ref 11.5–15.5)
WBC: 12.2 10*3/uL — ABNORMAL HIGH (ref 4.0–10.5)

## 2010-12-01 LAB — PROTIME-INR
INR: 2.49 — ABNORMAL HIGH (ref 0.00–1.49)
Prothrombin Time: 27.3 seconds — ABNORMAL HIGH (ref 11.6–15.2)

## 2010-12-01 LAB — URINE MICROSCOPIC-ADD ON

## 2010-12-02 ENCOUNTER — Emergency Department (HOSPITAL_COMMUNITY)
Admission: EM | Admit: 2010-12-02 | Discharge: 2010-12-02 | Disposition: A | Discharge status: Home / Self Care | Payer: Medicare Other | Attending: Emergency Medicine | Admitting: Emergency Medicine

## 2010-12-02 DIAGNOSIS — I2510 Atherosclerotic heart disease of native coronary artery without angina pectoris: Secondary | ICD-10-CM | POA: Insufficient documentation

## 2010-12-02 DIAGNOSIS — J449 Chronic obstructive pulmonary disease, unspecified: Secondary | ICD-10-CM | POA: Insufficient documentation

## 2010-12-02 DIAGNOSIS — M79609 Pain in unspecified limb: Secondary | ICD-10-CM | POA: Insufficient documentation

## 2010-12-02 DIAGNOSIS — M549 Dorsalgia, unspecified: Secondary | ICD-10-CM | POA: Insufficient documentation

## 2010-12-02 DIAGNOSIS — E039 Hypothyroidism, unspecified: Secondary | ICD-10-CM | POA: Insufficient documentation

## 2010-12-02 DIAGNOSIS — I10 Essential (primary) hypertension: Secondary | ICD-10-CM | POA: Insufficient documentation

## 2010-12-02 DIAGNOSIS — J4489 Other specified chronic obstructive pulmonary disease: Secondary | ICD-10-CM | POA: Insufficient documentation

## 2010-12-02 DIAGNOSIS — E785 Hyperlipidemia, unspecified: Secondary | ICD-10-CM | POA: Insufficient documentation

## 2010-12-02 DIAGNOSIS — IMO0002 Reserved for concepts with insufficient information to code with codable children: Secondary | ICD-10-CM | POA: Insufficient documentation

## 2010-12-02 LAB — URINALYSIS, ROUTINE W REFLEX MICROSCOPIC
Glucose, UA: NEGATIVE mg/dL
Hgb urine dipstick: NEGATIVE
Ketones, ur: NEGATIVE mg/dL
Leukocytes, UA: NEGATIVE
Nitrite: NEGATIVE
Protein, ur: NEGATIVE mg/dL
Specific Gravity, Urine: 1.028 (ref 1.005–1.030)
Urobilinogen, UA: 1 mg/dL (ref 0.0–1.0)
pH: 5 (ref 5.0–8.0)

## 2011-03-12 DIAGNOSIS — Z7901 Long term (current) use of anticoagulants: Secondary | ICD-10-CM | POA: Diagnosis not present

## 2011-03-12 DIAGNOSIS — I4891 Unspecified atrial fibrillation: Secondary | ICD-10-CM | POA: Diagnosis not present

## 2011-03-12 DIAGNOSIS — L408 Other psoriasis: Secondary | ICD-10-CM | POA: Diagnosis not present

## 2011-03-12 DIAGNOSIS — Z79899 Other long term (current) drug therapy: Secondary | ICD-10-CM | POA: Diagnosis not present

## 2011-03-18 DIAGNOSIS — J329 Chronic sinusitis, unspecified: Secondary | ICD-10-CM | POA: Diagnosis not present

## 2011-03-18 DIAGNOSIS — Z23 Encounter for immunization: Secondary | ICD-10-CM | POA: Diagnosis not present

## 2011-03-18 DIAGNOSIS — G4700 Insomnia, unspecified: Secondary | ICD-10-CM | POA: Diagnosis not present

## 2011-03-18 DIAGNOSIS — Z79899 Other long term (current) drug therapy: Secondary | ICD-10-CM | POA: Diagnosis not present

## 2011-03-18 DIAGNOSIS — E039 Hypothyroidism, unspecified: Secondary | ICD-10-CM | POA: Diagnosis not present

## 2011-03-18 DIAGNOSIS — M545 Low back pain, unspecified: Secondary | ICD-10-CM | POA: Diagnosis not present

## 2011-03-18 DIAGNOSIS — J4489 Other specified chronic obstructive pulmonary disease: Secondary | ICD-10-CM | POA: Diagnosis not present

## 2011-03-18 DIAGNOSIS — J449 Chronic obstructive pulmonary disease, unspecified: Secondary | ICD-10-CM | POA: Diagnosis not present

## 2011-03-18 DIAGNOSIS — I10 Essential (primary) hypertension: Secondary | ICD-10-CM | POA: Diagnosis not present

## 2011-04-14 DIAGNOSIS — I4891 Unspecified atrial fibrillation: Secondary | ICD-10-CM | POA: Diagnosis not present

## 2011-04-14 DIAGNOSIS — Z7901 Long term (current) use of anticoagulants: Secondary | ICD-10-CM | POA: Diagnosis not present

## 2011-05-14 DIAGNOSIS — I4891 Unspecified atrial fibrillation: Secondary | ICD-10-CM | POA: Diagnosis not present

## 2011-05-14 DIAGNOSIS — Z7901 Long term (current) use of anticoagulants: Secondary | ICD-10-CM | POA: Diagnosis not present

## 2011-06-25 DIAGNOSIS — Z7901 Long term (current) use of anticoagulants: Secondary | ICD-10-CM | POA: Diagnosis not present

## 2011-06-25 DIAGNOSIS — I10 Essential (primary) hypertension: Secondary | ICD-10-CM | POA: Diagnosis not present

## 2011-06-25 DIAGNOSIS — I4891 Unspecified atrial fibrillation: Secondary | ICD-10-CM | POA: Diagnosis not present

## 2011-06-25 DIAGNOSIS — I2510 Atherosclerotic heart disease of native coronary artery without angina pectoris: Secondary | ICD-10-CM | POA: Diagnosis not present

## 2011-07-31 DIAGNOSIS — L408 Other psoriasis: Secondary | ICD-10-CM | POA: Diagnosis not present

## 2011-07-31 DIAGNOSIS — Z79899 Other long term (current) drug therapy: Secondary | ICD-10-CM | POA: Diagnosis not present

## 2011-08-06 DIAGNOSIS — M545 Low back pain, unspecified: Secondary | ICD-10-CM | POA: Diagnosis not present

## 2011-08-06 DIAGNOSIS — F329 Major depressive disorder, single episode, unspecified: Secondary | ICD-10-CM | POA: Diagnosis not present

## 2011-08-06 DIAGNOSIS — E785 Hyperlipidemia, unspecified: Secondary | ICD-10-CM | POA: Diagnosis not present

## 2011-08-06 DIAGNOSIS — I10 Essential (primary) hypertension: Secondary | ICD-10-CM | POA: Diagnosis not present

## 2011-08-06 DIAGNOSIS — I4891 Unspecified atrial fibrillation: Secondary | ICD-10-CM | POA: Diagnosis not present

## 2011-08-06 DIAGNOSIS — F411 Generalized anxiety disorder: Secondary | ICD-10-CM | POA: Diagnosis not present

## 2011-08-06 DIAGNOSIS — J449 Chronic obstructive pulmonary disease, unspecified: Secondary | ICD-10-CM | POA: Diagnosis not present

## 2011-08-06 DIAGNOSIS — R946 Abnormal results of thyroid function studies: Secondary | ICD-10-CM | POA: Diagnosis not present

## 2011-08-06 DIAGNOSIS — J4489 Other specified chronic obstructive pulmonary disease: Secondary | ICD-10-CM | POA: Diagnosis not present

## 2011-08-06 DIAGNOSIS — F3289 Other specified depressive episodes: Secondary | ICD-10-CM | POA: Diagnosis not present

## 2011-08-06 DIAGNOSIS — Z79899 Other long term (current) drug therapy: Secondary | ICD-10-CM | POA: Diagnosis not present

## 2011-08-06 DIAGNOSIS — Z7901 Long term (current) use of anticoagulants: Secondary | ICD-10-CM | POA: Diagnosis not present

## 2011-08-10 DIAGNOSIS — I4891 Unspecified atrial fibrillation: Secondary | ICD-10-CM | POA: Diagnosis not present

## 2011-08-10 DIAGNOSIS — Z7901 Long term (current) use of anticoagulants: Secondary | ICD-10-CM | POA: Diagnosis not present

## 2011-08-20 DIAGNOSIS — I4891 Unspecified atrial fibrillation: Secondary | ICD-10-CM | POA: Diagnosis not present

## 2011-08-20 DIAGNOSIS — Z7901 Long term (current) use of anticoagulants: Secondary | ICD-10-CM | POA: Diagnosis not present

## 2011-09-02 DIAGNOSIS — I4891 Unspecified atrial fibrillation: Secondary | ICD-10-CM | POA: Diagnosis not present

## 2011-09-02 DIAGNOSIS — Z7901 Long term (current) use of anticoagulants: Secondary | ICD-10-CM | POA: Diagnosis not present

## 2011-10-01 ENCOUNTER — Other Ambulatory Visit: Payer: Self-pay | Admitting: Critical Care Medicine

## 2011-10-02 DIAGNOSIS — I4891 Unspecified atrial fibrillation: Secondary | ICD-10-CM | POA: Diagnosis not present

## 2011-10-02 DIAGNOSIS — Z7901 Long term (current) use of anticoagulants: Secondary | ICD-10-CM | POA: Diagnosis not present

## 2011-11-20 DIAGNOSIS — Z7901 Long term (current) use of anticoagulants: Secondary | ICD-10-CM | POA: Diagnosis not present

## 2011-11-20 DIAGNOSIS — Z79899 Other long term (current) drug therapy: Secondary | ICD-10-CM | POA: Diagnosis not present

## 2011-11-20 DIAGNOSIS — I4891 Unspecified atrial fibrillation: Secondary | ICD-10-CM | POA: Diagnosis not present

## 2011-11-20 DIAGNOSIS — L408 Other psoriasis: Secondary | ICD-10-CM | POA: Diagnosis not present

## 2011-12-25 DIAGNOSIS — E785 Hyperlipidemia, unspecified: Secondary | ICD-10-CM | POA: Diagnosis not present

## 2011-12-25 DIAGNOSIS — I2510 Atherosclerotic heart disease of native coronary artery without angina pectoris: Secondary | ICD-10-CM | POA: Diagnosis not present

## 2011-12-25 DIAGNOSIS — E669 Obesity, unspecified: Secondary | ICD-10-CM | POA: Diagnosis not present

## 2011-12-25 DIAGNOSIS — Z7901 Long term (current) use of anticoagulants: Secondary | ICD-10-CM | POA: Diagnosis not present

## 2011-12-25 DIAGNOSIS — I4891 Unspecified atrial fibrillation: Secondary | ICD-10-CM | POA: Diagnosis not present

## 2012-01-01 DIAGNOSIS — R059 Cough, unspecified: Secondary | ICD-10-CM | POA: Diagnosis not present

## 2012-01-01 DIAGNOSIS — Z79899 Other long term (current) drug therapy: Secondary | ICD-10-CM | POA: Diagnosis not present

## 2012-01-01 DIAGNOSIS — E785 Hyperlipidemia, unspecified: Secondary | ICD-10-CM | POA: Diagnosis not present

## 2012-01-01 DIAGNOSIS — I2510 Atherosclerotic heart disease of native coronary artery without angina pectoris: Secondary | ICD-10-CM | POA: Diagnosis not present

## 2012-01-01 DIAGNOSIS — J441 Chronic obstructive pulmonary disease with (acute) exacerbation: Secondary | ICD-10-CM | POA: Diagnosis not present

## 2012-01-01 DIAGNOSIS — R05 Cough: Secondary | ICD-10-CM | POA: Diagnosis not present

## 2012-01-01 DIAGNOSIS — Z23 Encounter for immunization: Secondary | ICD-10-CM | POA: Diagnosis not present

## 2012-01-01 DIAGNOSIS — J019 Acute sinusitis, unspecified: Secondary | ICD-10-CM | POA: Diagnosis not present

## 2012-01-01 DIAGNOSIS — Z7901 Long term (current) use of anticoagulants: Secondary | ICD-10-CM | POA: Diagnosis not present

## 2012-01-01 DIAGNOSIS — G589 Mononeuropathy, unspecified: Secondary | ICD-10-CM | POA: Diagnosis not present

## 2012-01-11 DIAGNOSIS — Z7901 Long term (current) use of anticoagulants: Secondary | ICD-10-CM | POA: Diagnosis not present

## 2012-01-11 DIAGNOSIS — I4891 Unspecified atrial fibrillation: Secondary | ICD-10-CM | POA: Diagnosis not present

## 2012-01-30 ENCOUNTER — Other Ambulatory Visit: Payer: Self-pay | Admitting: Critical Care Medicine

## 2012-02-02 ENCOUNTER — Telehealth: Payer: Self-pay | Admitting: Critical Care Medicine

## 2012-02-02 NOTE — Telephone Encounter (Signed)
Pt was last seen by Dr. Delford Field on 06/11/2010.  She was asked to f/u in 2 months.  No pending appts at this time.    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  Pls try your call again later. Called pt at [phone] -- [email or web address]  We need to see if pt can schedule f/u OV with PW.

## 2012-02-02 NOTE — Telephone Encounter (Signed)
Pt needs to make an appt for any refills on her Symbicort. After appt is made we can send enough refills until she is seen. LMOMTCB x 1.

## 2012-02-03 ENCOUNTER — Ambulatory Visit: Payer: No Typology Code available for payment source | Admitting: Critical Care Medicine

## 2012-02-03 NOTE — Telephone Encounter (Signed)
Pt will come in today for 4 pm appt with PW.

## 2012-02-04 NOTE — Telephone Encounter (Signed)
Pt has appointment tomorrow and states that she can wait until then to have a refill.

## 2012-02-05 ENCOUNTER — Encounter: Payer: Self-pay | Admitting: Critical Care Medicine

## 2012-02-05 ENCOUNTER — Ambulatory Visit (INDEPENDENT_AMBULATORY_CARE_PROVIDER_SITE_OTHER): Payer: Medicare Other | Admitting: Critical Care Medicine

## 2012-02-05 VITALS — BP 126/90 | HR 65 | Temp 98.6°F | Ht 62.0 in | Wt 225.2 lb

## 2012-02-05 DIAGNOSIS — J449 Chronic obstructive pulmonary disease, unspecified: Secondary | ICD-10-CM | POA: Diagnosis not present

## 2012-02-05 DIAGNOSIS — Z7901 Long term (current) use of anticoagulants: Secondary | ICD-10-CM | POA: Diagnosis not present

## 2012-02-05 DIAGNOSIS — I4891 Unspecified atrial fibrillation: Secondary | ICD-10-CM | POA: Diagnosis not present

## 2012-02-05 MED ORDER — BUDESONIDE-FORMOTEROL FUMARATE 160-4.5 MCG/ACT IN AERO: 2.0000 | INHALATION_SPRAY | Freq: Two times a day (BID) | RESPIRATORY_TRACT | Status: DC

## 2012-02-05 MED ORDER — ALBUTEROL SULFATE HFA 108 (90 BASE) MCG/ACT IN AERS: 2.0000 | INHALATION_SPRAY | Freq: Four times a day (QID) | RESPIRATORY_TRACT | Status: DC | PRN

## 2012-02-05 NOTE — Progress Notes (Signed)
Subjective:    Patient ID: Gabrielle [name], female    DOB: 1945-10-02, 66 y.o.   MRN: [id number]  HPI 02/05/2012 Not seen since 06/2010.  Pt doing fairly well. Pt still on warfarin.  Pt had an admission for tachycardia 2012.  Since then ok with HR.  Pt coughs if gets warm.  Pt with asthma issues. No real wheeze.  No chest pain.  No smoking in a week.  If gets depressed or upset or if around others will smoke.    Past Medical History  Diagnosis Date  . Chronic headache   . Hyperlipidemia   . Asthma   . Chronic bronchitis   . Heart attack   . Arthritis   . Hypertension   . Depression   . COPD (chronic obstructive pulmonary disease)   . Atrial fibrillation with controlled ventricular response 11/2009     Family History  Problem Relation Age of Onset  . Heart disease Father   . Lung cancer Maternal Grandfather   . Lung cancer Paternal Grandfather      History   Social History  . Marital Status: Divorced    Spouse Name: N/A    Number of Children: N/A  . Years of Education: N/A   Occupational History  . retired Clinical biochemist   Social History Main Topics  . Smoking status: Current Some Day Smoker -- 0.1 packs/day    Types: Cigarettes  . Smokeless tobacco: Never Used     Comment: Started at age 75.  Last cig was x 1 wk ago.  . Alcohol Use: Not on file  . Drug Use: Not on file  . Sexually Active: Not on file   Other Topics Concern  . Not on file   Social History Narrative  . No narrative on file     Allergies  Allergen Reactions  . Penicillins     REACTION: hives  . Procaine Hcl     REACTION: palpatations         Review of Systems  Constitutional:   No  weight loss, night sweats,  Fevers, chills, fatigue, lassitude. HEENT:   No headaches,  Difficulty swallowing,  Tooth/dental problems,  Sore throat,                No sneezing, itching, ear ache, nasal congestion, post nasal drip,   CV:  No chest pain,  Orthopnea, PND, swelling in lower  extremities, anasarca, dizziness, palpitations  GI  Notes  Heartburn,notes  indigestion, no abdominal pain, nausea, vomiting, diarrhea, change in bowel habits, loss of appetite  Resp: Notes  shortness of breath with exertion not   at rest.  No  excess mucus, no  productive cough,  No  non-productive cough,  No coughing up of blood.  Nos a  change in color of mucus.  No some  wheezing.  No chest wall deformity  Skin: no rash or lesions.  GU: no dysuria, change in color of urine, no urgency or frequency.  No flank pain.  MS:  No joint pain or swelling.  No decreased range of motion.  No back pain.  Psych:  No change in mood or affect. No depression or anxiety.  No memory loss.     Objective:   Physical Exam BP 126/90  Pulse 65  Temp 98.6 F (37 C) (Oral)  Ht 5\' 2"  (1.575 m)  Wt 225 lb 3.2 oz (102.15 kg)  BMI 41.19 kg/m2  SpO2 92%  Gen: Pleasant, well-nourished, in no  distress,  normal affect  ENT: No lesions,  mouth clear,  oropharynx clear, no postnasal drip  Neck: No JVD, no TMG, no carotid bruits  Lungs: No use of accessory muscles, no dullness to percussion,, poor airflow  Cardiovascular: RRR, heart sounds normal, no murmur or gallops, no peripheral edema  Abdomen: soft and NT, no HSM,  BS normal  Musculoskeletal: No deformities, no cyanosis or clubbing  Neuro: alert, non focal  Skin: Warm, no lesions or rashes        Assessment & Plan:   C O P D  Gold C  Severe COPD Golds stage C with ongoing tobacco abuse Cleda Daub 02/05/2012:  FeV1 1.10 53%  FVC 1.49 56% FeV1/FVC 72% fef 25 75 % 42%   Plan   REsume symbicort two puff twice daily Stop smoking completely, consider using nicorette minis 4mg  2-4 times per day in addition to Nicotrol Return 6 months     Updated Medication List Outpatient Encounter Prescriptions as of 02/05/2012  Medication Sig Dispense Refill  . albuterol (PROVENTIL HFA) 108 (90 BASE) MCG/ACT inhaler Inhale 2 puffs into the lungs every 6  (six) hours as needed.  1 Inhaler  6  . ALPRAZolam (XANAX) 0.5 MG tablet Take 0.5 mg by mouth 2 (two) times daily as needed.       Marland Kitchen aspirin 81 MG tablet Take 81 mg by mouth daily.        Marland Kitchen atorvastatin (LIPITOR) 20 MG tablet Take 1 tablet by mouth daily.      . citalopram (CELEXA) 20 MG tablet Take 1 tablet by mouth daily.      Marland Kitchen dextromethorphan-guaiFENesin (MUCINEX DM) 30-600 MG per 12 hr tablet Take 1 tablet by mouth every 12 (twelve) hours as needed.      . diphenhydramine-acetaminophen (TYLENOL PM) 25-500 MG TABS Take 1 tablet by mouth at bedtime as needed.      . hydrocodone-acetaminophen (LORCET-HD) 5-500 MG per capsule Take 1 capsule by mouth every 6 (six) hours as needed.        . methotrexate (RHEUMATREX) 2.5 MG tablet Take 3 tablets by mouth Once a week.       . metoprolol (TOPROL-XL) 50 MG 24 hr tablet Take 50 mg by mouth daily.        Marland Kitchen oxymetazoline (AFRIN) 0.05 % nasal spray Place 2 sprays into the nose as needed.      . sotalol (BETAPACE) 80 MG tablet Take 1 tablet by mouth daily. And a second time when needed.      . Triamcinolone Acetonide (TRIAMCINOLONE IN ABSORBASE) 0.05 % OINT Apply topically as needed.      . warfarin (COUMADIN) 2.5 MG tablet Take 2.5 mg by mouth as directed.      . [DISCONTINUED] albuterol (PROVENTIL HFA) 108 (90 BASE) MCG/ACT inhaler Inhale 2 puffs into the lungs every 6 (six) hours as needed.        . [DISCONTINUED] budesonide-formoterol (SYMBICORT) 160-4.5 MCG/ACT inhaler       . [DISCONTINUED] SYMBICORT 160-4.5 MCG/ACT inhaler INHALE 2 PUFFS BY MOUTH INTO THE LUNGS 2 (TWO) TIMES DAILY.  1 Inhaler  0  . budesonide-formoterol (SYMBICORT) 160-4.5 MCG/ACT inhaler Inhale 2 puffs into the lungs 2 (two) times daily.  10.2 g  6  . [DISCONTINUED] diltiazem (CARDIZEM) 120 MG tablet Take 1 tablet by mouth Daily.      . [DISCONTINUED] simvastatin (ZOCOR) 10 MG tablet Take 10 mg by mouth at bedtime.        . [  DISCONTINUED] warfarin (COUMADIN) 5 MG tablet Take as  directed

## 2012-02-05 NOTE — Patient Instructions (Addendum)
REsume symbicort two puff twice daily Stop smoking completely, consider using nicorette minis 4mg  2-4 times per day in addition to Nicotrol Return 6 months

## 2012-02-07 NOTE — Assessment & Plan Note (Signed)
Severe COPD Golds stage C with ongoing tobacco abuse Cleda Daub 02/05/2012:  FeV1 1.10 53%  FVC 1.49 56% FeV1/FVC 72% fef 25 75 % 42%   Plan   REsume symbicort two puff twice daily Stop smoking completely, consider using nicorette minis 4mg  2-4 times per day in addition to Nicotrol Return 6 months

## 2012-02-26 DIAGNOSIS — R0602 Shortness of breath: Secondary | ICD-10-CM | POA: Diagnosis not present

## 2012-02-26 DIAGNOSIS — I10 Essential (primary) hypertension: Secondary | ICD-10-CM | POA: Diagnosis not present

## 2012-02-26 DIAGNOSIS — Z7901 Long term (current) use of anticoagulants: Secondary | ICD-10-CM | POA: Diagnosis not present

## 2012-02-26 DIAGNOSIS — I2510 Atherosclerotic heart disease of native coronary artery without angina pectoris: Secondary | ICD-10-CM | POA: Diagnosis not present

## 2012-02-26 DIAGNOSIS — I4891 Unspecified atrial fibrillation: Secondary | ICD-10-CM | POA: Diagnosis not present

## 2012-03-24 DIAGNOSIS — I4891 Unspecified atrial fibrillation: Secondary | ICD-10-CM | POA: Diagnosis not present

## 2012-03-24 DIAGNOSIS — Z7901 Long term (current) use of anticoagulants: Secondary | ICD-10-CM | POA: Diagnosis not present

## 2012-03-30 ENCOUNTER — Other Ambulatory Visit: Payer: Self-pay | Admitting: Family Medicine

## 2012-03-30 DIAGNOSIS — Z1231 Encounter for screening mammogram for malignant neoplasm of breast: Secondary | ICD-10-CM

## 2012-03-30 DIAGNOSIS — Z780 Asymptomatic menopausal state: Secondary | ICD-10-CM

## 2012-05-02 DIAGNOSIS — Z7901 Long term (current) use of anticoagulants: Secondary | ICD-10-CM | POA: Diagnosis not present

## 2012-05-02 DIAGNOSIS — I4891 Unspecified atrial fibrillation: Secondary | ICD-10-CM | POA: Diagnosis not present

## 2012-05-24 ENCOUNTER — Other Ambulatory Visit: Payer: Medicare Other

## 2012-05-24 ENCOUNTER — Ambulatory Visit: Payer: Medicare Other

## 2012-06-02 DIAGNOSIS — I4891 Unspecified atrial fibrillation: Secondary | ICD-10-CM | POA: Diagnosis not present

## 2012-06-02 DIAGNOSIS — L408 Other psoriasis: Secondary | ICD-10-CM | POA: Diagnosis not present

## 2012-06-02 DIAGNOSIS — Z79899 Other long term (current) drug therapy: Secondary | ICD-10-CM | POA: Diagnosis not present

## 2012-06-02 DIAGNOSIS — Z7901 Long term (current) use of anticoagulants: Secondary | ICD-10-CM | POA: Diagnosis not present

## 2012-06-14 ENCOUNTER — Other Ambulatory Visit: Payer: Medicare Other

## 2012-06-14 ENCOUNTER — Ambulatory Visit: Payer: Medicare Other

## 2012-07-13 DIAGNOSIS — I4891 Unspecified atrial fibrillation: Secondary | ICD-10-CM | POA: Diagnosis not present

## 2012-07-13 DIAGNOSIS — Z7901 Long term (current) use of anticoagulants: Secondary | ICD-10-CM | POA: Diagnosis not present

## 2012-07-29 DIAGNOSIS — I2510 Atherosclerotic heart disease of native coronary artery without angina pectoris: Secondary | ICD-10-CM | POA: Diagnosis not present

## 2012-07-29 DIAGNOSIS — I10 Essential (primary) hypertension: Secondary | ICD-10-CM | POA: Diagnosis not present

## 2012-07-29 DIAGNOSIS — E669 Obesity, unspecified: Secondary | ICD-10-CM | POA: Diagnosis not present

## 2012-07-29 DIAGNOSIS — I4891 Unspecified atrial fibrillation: Secondary | ICD-10-CM | POA: Diagnosis not present

## 2012-07-29 DIAGNOSIS — Z7901 Long term (current) use of anticoagulants: Secondary | ICD-10-CM | POA: Diagnosis not present

## 2012-07-29 DIAGNOSIS — E785 Hyperlipidemia, unspecified: Secondary | ICD-10-CM | POA: Diagnosis not present

## 2012-07-29 DIAGNOSIS — J449 Chronic obstructive pulmonary disease, unspecified: Secondary | ICD-10-CM | POA: Diagnosis not present

## 2012-08-15 ENCOUNTER — Ambulatory Visit: Payer: Medicare Other | Admitting: Critical Care Medicine

## 2012-08-16 ENCOUNTER — Encounter: Payer: Self-pay | Admitting: Critical Care Medicine

## 2012-08-29 DIAGNOSIS — I2510 Atherosclerotic heart disease of native coronary artery without angina pectoris: Secondary | ICD-10-CM | POA: Diagnosis not present

## 2012-08-29 DIAGNOSIS — M545 Low back pain, unspecified: Secondary | ICD-10-CM | POA: Diagnosis not present

## 2012-08-29 DIAGNOSIS — J4489 Other specified chronic obstructive pulmonary disease: Secondary | ICD-10-CM | POA: Diagnosis not present

## 2012-08-29 DIAGNOSIS — Z7901 Long term (current) use of anticoagulants: Secondary | ICD-10-CM | POA: Diagnosis not present

## 2012-08-29 DIAGNOSIS — F331 Major depressive disorder, recurrent, moderate: Secondary | ICD-10-CM | POA: Diagnosis not present

## 2012-08-29 DIAGNOSIS — I4891 Unspecified atrial fibrillation: Secondary | ICD-10-CM | POA: Diagnosis not present

## 2012-08-29 DIAGNOSIS — R82998 Other abnormal findings in urine: Secondary | ICD-10-CM | POA: Diagnosis not present

## 2012-08-29 DIAGNOSIS — I10 Essential (primary) hypertension: Secondary | ICD-10-CM | POA: Diagnosis not present

## 2012-08-29 DIAGNOSIS — E039 Hypothyroidism, unspecified: Secondary | ICD-10-CM | POA: Diagnosis not present

## 2012-08-29 DIAGNOSIS — J449 Chronic obstructive pulmonary disease, unspecified: Secondary | ICD-10-CM | POA: Diagnosis not present

## 2012-08-29 DIAGNOSIS — E785 Hyperlipidemia, unspecified: Secondary | ICD-10-CM | POA: Diagnosis not present

## 2012-09-08 DIAGNOSIS — Z7901 Long term (current) use of anticoagulants: Secondary | ICD-10-CM | POA: Diagnosis not present

## 2012-09-08 DIAGNOSIS — Z79899 Other long term (current) drug therapy: Secondary | ICD-10-CM | POA: Diagnosis not present

## 2012-09-08 DIAGNOSIS — I4891 Unspecified atrial fibrillation: Secondary | ICD-10-CM | POA: Diagnosis not present

## 2012-09-08 DIAGNOSIS — L408 Other psoriasis: Secondary | ICD-10-CM | POA: Diagnosis not present

## 2012-09-23 DIAGNOSIS — Z7901 Long term (current) use of anticoagulants: Secondary | ICD-10-CM | POA: Diagnosis not present

## 2012-09-23 DIAGNOSIS — I4891 Unspecified atrial fibrillation: Secondary | ICD-10-CM | POA: Diagnosis not present

## 2012-10-06 DIAGNOSIS — Z7901 Long term (current) use of anticoagulants: Secondary | ICD-10-CM | POA: Diagnosis not present

## 2012-10-06 DIAGNOSIS — I4891 Unspecified atrial fibrillation: Secondary | ICD-10-CM | POA: Diagnosis not present

## 2012-10-27 DIAGNOSIS — Z7901 Long term (current) use of anticoagulants: Secondary | ICD-10-CM | POA: Diagnosis not present

## 2012-10-27 DIAGNOSIS — I4891 Unspecified atrial fibrillation: Secondary | ICD-10-CM | POA: Diagnosis not present

## 2012-11-01 DIAGNOSIS — I4891 Unspecified atrial fibrillation: Secondary | ICD-10-CM | POA: Diagnosis not present

## 2012-11-01 DIAGNOSIS — Z7901 Long term (current) use of anticoagulants: Secondary | ICD-10-CM | POA: Diagnosis not present

## 2012-11-08 DIAGNOSIS — R002 Palpitations: Secondary | ICD-10-CM | POA: Diagnosis not present

## 2012-11-23 DIAGNOSIS — I4891 Unspecified atrial fibrillation: Secondary | ICD-10-CM | POA: Diagnosis not present

## 2012-11-23 DIAGNOSIS — Z7901 Long term (current) use of anticoagulants: Secondary | ICD-10-CM | POA: Diagnosis not present

## 2012-12-20 ENCOUNTER — Ambulatory Visit (INDEPENDENT_AMBULATORY_CARE_PROVIDER_SITE_OTHER): Payer: Medicare Other | Admitting: Pharmacist

## 2012-12-20 DIAGNOSIS — I4891 Unspecified atrial fibrillation: Secondary | ICD-10-CM

## 2012-12-20 LAB — POCT INR: INR: 2.4

## 2013-01-14 ENCOUNTER — Other Ambulatory Visit: Payer: Self-pay | Admitting: Critical Care Medicine

## 2013-01-16 NOTE — Telephone Encounter (Signed)
Pt was last seen by PW on 02/05/12; asked to f/u in 6 months. Pt has no pending OV. Called, spoke with pt.  Pt states she did not request a symbicort rx and has plenty of this right now.   Pt would like to schedule her yearly follow up with Dr. Delford Field. Per pt's request, we have scheduled this for Thursday, Nov 20 at 11:30 at [facility].  Pt aware. She would like for me to deny the symbicort rx at this time. This has been done per pt's request. She voiced no further questions or concerns at this time.

## 2013-01-18 ENCOUNTER — Encounter: Payer: Self-pay | Admitting: Cardiology

## 2013-01-19 ENCOUNTER — Encounter: Payer: Self-pay | Admitting: Cardiology

## 2013-01-19 ENCOUNTER — Encounter: Payer: Self-pay | Admitting: Critical Care Medicine

## 2013-01-19 ENCOUNTER — Ambulatory Visit (INDEPENDENT_AMBULATORY_CARE_PROVIDER_SITE_OTHER): Payer: Medicare Other | Admitting: Pharmacist

## 2013-01-19 ENCOUNTER — Ambulatory Visit (INDEPENDENT_AMBULATORY_CARE_PROVIDER_SITE_OTHER): Payer: Medicare Other | Admitting: Cardiology

## 2013-01-19 ENCOUNTER — Ambulatory Visit (INDEPENDENT_AMBULATORY_CARE_PROVIDER_SITE_OTHER): Payer: Medicare Other | Admitting: Critical Care Medicine

## 2013-01-19 VITALS — BP 130/86 | HR 49 | Temp 97.3°F | Ht 63.0 in | Wt 207.0 lb

## 2013-01-19 VITALS — BP 112/70 | HR 46 | Ht 63.0 in | Wt 205.8 lb

## 2013-01-19 DIAGNOSIS — J449 Chronic obstructive pulmonary disease, unspecified: Secondary | ICD-10-CM | POA: Diagnosis not present

## 2013-01-19 DIAGNOSIS — I10 Essential (primary) hypertension: Secondary | ICD-10-CM

## 2013-01-19 DIAGNOSIS — F17200 Nicotine dependence, unspecified, uncomplicated: Secondary | ICD-10-CM | POA: Diagnosis not present

## 2013-01-19 DIAGNOSIS — Z23 Encounter for immunization: Secondary | ICD-10-CM

## 2013-01-19 DIAGNOSIS — E785 Hyperlipidemia, unspecified: Secondary | ICD-10-CM

## 2013-01-19 DIAGNOSIS — I4891 Unspecified atrial fibrillation: Secondary | ICD-10-CM

## 2013-01-19 LAB — POCT INR
INR: 2.1
INR: 2.1

## 2013-01-19 MED ORDER — METOPROLOL SUCCINATE ER 50 MG PO TB24: 25.0000 mg | ORAL_TABLET | Freq: Every day | ORAL | Status: DC

## 2013-01-19 NOTE — Progress Notes (Signed)
[address] [place], [place]  [address] Phone: [phone] Fax:  [phone]  Date:  01/19/2013   ID:  Gabrielle [name], Gabrielle [name] Nov 15, 1945, MRN [id number]  PCP:  [name], MD   History of Present Illness: Gabrielle [name] is a 67 y.o. female with coronary artery disease status post bypass surgery in 2010, paroxysmal atrial fibrillation on chronic anticoagulation, antiarrhythmics sotalol, EF 45-50%. COPD. Obesity. Previous visit she describes shortness of breath, fatigue, headaches with palpitations after using her inhalers. Stressful holidays. Feels palpitations when she really exerts herself. Pulling in garbage can. When sat down heart was pounding. She admits that she is not taking her sotalol all the time. Sometimes she only takes it once a day. She's done a great job with weight loss, 12 pounds. She states that she had mouth surgery and oxycodone did not do well for her.  She continues to lose weight. No chest pain.   Wt Readings from Last 3 Encounters:  01/19/13 205 lb 12.8 oz (93.35 kg)  01/19/13 207 lb (93.895 kg)  02/05/12 225 lb 3.2 oz (102.15 kg)     Past Medical History  Diagnosis Date  . Chronic headache   . Hyperlipidemia   . Asthma   . Chronic bronchitis   . Heart attack   . Arthritis   . Hypertension   . Depression   . COPD (chronic obstructive pulmonary disease)   . Atrial fibrillation with controlled ventricular response 11/2009    Past Surgical History  Procedure Laterality Date  . Cesarean section      x2  . Total knee arthroplasty    . Coronary artery bypass graft  2011    Current Outpatient Prescriptions  Medication Sig Dispense Refill  . albuterol (PROVENTIL HFA) 108 (90 BASE) MCG/ACT inhaler Inhale 2 puffs into the lungs every 6 (six) hours as needed.  1 Inhaler  6  . ALPRAZolam (XANAX) 0.5 MG tablet Take 0.5 mg by mouth 2 (two) times daily as needed.       Marland Kitchen aspirin 81 MG tablet Take 81 mg by mouth daily.        Marland Kitchen  atorvastatin (LIPITOR) 20 MG tablet Take 1 tablet by mouth daily.      . budesonide-formoterol (SYMBICORT) 160-4.5 MCG/ACT inhaler Inhale 2 puffs into the lungs 2 (two) times daily.  10.2 g  6  . citalopram (CELEXA) 20 MG tablet Take 1 tablet by mouth daily.      Marland Kitchen dextromethorphan-guaiFENesin (MUCINEX DM) 30-600 MG per 12 hr tablet Take 1 tablet by mouth every 12 (twelve) hours as needed.      . diphenhydramine-acetaminophen (TYLENOL PM) 25-500 MG TABS Take 1 tablet by mouth at bedtime as needed.      . hydrocodone-acetaminophen (LORCET-HD) 5-500 MG per capsule Take 1 capsule by mouth every 6 (six) hours as needed.        . methotrexate (RHEUMATREX) 2.5 MG tablet Take 3 tablets by mouth Once a week.       . metoprolol (TOPROL-XL) 50 MG 24 hr tablet Take 50 mg by mouth daily.        Marland Kitchen oxymetazoline (AFRIN) 0.05 % nasal spray Place 2 sprays into the nose as needed.      . sotalol (BETAPACE) 80 MG tablet Take 1 tablet by mouth 2 (two) times daily. And a second time when needed.      . Triamcinolone Acetonide (TRIAMCINOLONE IN ABSORBASE) 0.05 % OINT Apply topically  as needed.      . warfarin (COUMADIN) 2.5 MG tablet Take 2.5 mg by mouth as directed.       No current facility-administered medications for this visit.    Allergies:    Allergies  Allergen Reactions  . Penicillins     REACTION: hives  . Procaine Hcl     REACTION: palpatations    Social History:  The patient  reports that she has been smoking Cigarettes.  She has been smoking about 0.10 packs per day. She has never used smokeless tobacco.   ROS:  Please see the history of present illness.   Denies any syncope, bleeding, orthopnea, PND    PHYSICAL EXAM: VS:  BP 112/70  Pulse 46  Ht 5\' 3"  (1.6 m)  Wt 205 lb 12.8 oz (93.35 kg)  BMI 36.46 kg/m2 Well nourished, well developed, in no acute distress HEENT: normal Neck: no JVD Cardiac: Sinus bradycardia, regular; no murmur Lungs:  Mildly decreased air movement, no active  wheezing Abd: soft, nontender, no hepatomegaly Ext: no edema Skin: warm and dry Neuro: no focal abnormalities noted  EKG:  Sinus bradycardia rate 46, low voltage, nonspecific T-wave changes     ASSESSMENT AND PLAN:  1. AFIB - wore a monitor event. Currently sinus bradycardia. I will decrease her metoprolol to 25 mg once a day. Continue with sotalol. Honestly, she states that she takes a sotalol usually only at night. Currently in normal rhythm. Very rarely does she feel palpitations. 2. Chronic anticoagulation-continue with warfarin. [employer], [place].D. Therapeutic, prior lab work reviewed. 3. Back pain-she asked if we would be able to give her Vicodin prescription on a monthly basis and she sees Riki Rusk, unable to do that. 4. Obesity-great Job with weight loss, approximately 20 pounds since December. 5. COPD-saw Dr. Delford Field earlier today.  Signed, Donato Schultz, MD [facility]  01/19/2013 2:16 PM

## 2013-01-19 NOTE — Patient Instructions (Addendum)
Your physician has recommended you make the following change in your medication:   1. Decrease Metoprolol to 25mg  (1/2 tablet) once daily.  Your physician wants you to follow-up in: 6 months with Dr. Anne Fu. You will receive a reminder letter in the mail two months in advance. If you don't receive a letter, please call our office to schedule the follow-up appointment.

## 2013-01-19 NOTE — Patient Instructions (Signed)
Stay on symbicort two puff twice daily Try to get off cigarettes, using nicorette minis 4mg . Flu vaccine was given Return 6 months

## 2013-01-20 NOTE — Assessment & Plan Note (Signed)
Copd stable at present Plan Stay on symbicort two puff twice daily Try to get off cigarettes, using nicorette minis 4mg . Flu vaccine was given Return 6 months

## 2013-01-21 NOTE — Progress Notes (Signed)
Subjective:    Patient ID: Gabrielle [name], female    DOB: February 15, 1946, 67 y.o.   MRN: [id number]  HPI 02/05/2012 Not seen since 06/2010.  Pt doing fairly well. Pt still on warfarin.  Pt had an admission for tachycardia 2012.  Since then ok with HR.  Pt coughs if gets warm.  Pt with asthma issues. No real wheeze.  No chest pain.  No smoking in a week.  If gets depressed or upset or if around others will smoke.  01/21/11 Chief Complaint  Patient presents with  . Follow-up    Breathing  is doing well.  Has slight cough w/ white mucus  No changes in symptom complex .  No new c/o.   Pt denies any significant sore throat, nasal congestion or excess secretions, fever, chills, sweats, unintended weight loss, pleurtic or exertional chest pain, orthopnea PND, or leg swelling Pt denies any increase in rescue therapy over baseline, denies waking up needing it or having any early am or nocturnal exacerbations of coughing/wheezing/or dyspnea. Pt also denies any obvious fluctuation in symptoms with  weather or environmental change or other alleviating or aggravating factors      Past Medical History  Diagnosis Date  . Chronic headache   . Hyperlipidemia   . Asthma   . Chronic bronchitis   . Heart attack   . Arthritis   . Hypertension   . Depression   . COPD (chronic obstructive pulmonary disease)   . Atrial fibrillation with controlled ventricular response 11/2009     Family History  Problem Relation Age of Onset  . Heart disease Father   . Lung cancer Maternal Grandfather   . Lung cancer Paternal Grandfather      History   Social History  . Marital Status: Divorced    Spouse Name: N/A    Number of Children: N/A  . Years of Education: N/A   Occupational History  . retired Clinical biochemist   Social History Main Topics  . Smoking status: Current Some Day Smoker -- 0.10 packs/day    Types: Cigarettes  . Smokeless tobacco: Never Used     Comment: Started at age 89.   Last cig was x 1 wk ago.  . Alcohol Use: Not on file  . Drug Use: Not on file  . Sexual Activity: Not on file   Other Topics Concern  . Not on file   Social History Narrative  . No narrative on file     Allergies  Allergen Reactions  . Penicillins     REACTION: hives  . Procaine Hcl     REACTION: palpatations         Review of Systems  Constitutional:   No  weight loss, night sweats,  Fevers, chills, fatigue, lassitude. HEENT:   No headaches,  Difficulty swallowing,  Tooth/dental problems,  Sore throat,                No sneezing, itching, ear ache, nasal congestion, post nasal drip,   CV:  No chest pain,  Orthopnea, PND, swelling in lower extremities, anasarca, dizziness, palpitations  GI  Notes  Heartburn,notes  indigestion, no abdominal pain, nausea, vomiting, diarrhea, change in bowel habits, loss of appetite  Resp: Notes  shortness of breath with exertion not   at rest.  No  excess mucus, no  productive cough,  No  non-productive cough,  No coughing up of blood.  Nos a  change in color of mucus.  No some  wheezing.  No chest wall deformity  Skin: no rash or lesions.  GU: no dysuria, change in color of urine, no urgency or frequency.  No flank pain.  MS:  No joint pain or swelling.  No decreased range of motion.  No back pain.  Psych:  No change in mood or affect. No depression or anxiety.  No memory loss.     Objective:   Physical Exam BP 130/86  Pulse 49  Temp(Src) 97.3 F (36.3 C) (Oral)  Ht 5\' 3"  (1.6 m)  Wt 207 lb (93.895 kg)  BMI 36.68 kg/m2  SpO2 95%  Gen: Pleasant, well-nourished, in no distress,  normal affect  ENT: No lesions,  mouth clear,  oropharynx clear, no postnasal drip  Neck: No JVD, no TMG, no carotid bruits  Lungs: No use of accessory muscles, no dullness to percussion,, poor airflow  Cardiovascular: RRR, heart sounds normal, no murmur or gallops, no peripheral edema  Abdomen: soft and NT, no HSM,  BS normal  Musculoskeletal:  No deformities, no cyanosis or clubbing  Neuro: alert, non focal  Skin: Warm, no lesions or rashes        Assessment & Plan:   C O P D  Gold C  Copd stable at present Plan Stay on symbicort two puff twice daily Try to get off cigarettes, using nicorette minis 4mg . Flu vaccine was given Return 6 months     Updated Medication List Outpatient Encounter Prescriptions as of 01/19/2013  Medication Sig  . albuterol (PROVENTIL HFA) 108 (90 BASE) MCG/ACT inhaler Inhale 2 puffs into the lungs every 6 (six) hours as needed.  . ALPRAZolam (XANAX) 0.5 MG tablet Take 0.5 mg by mouth 2 (two) times daily as needed.   Marland Kitchen aspirin 81 MG tablet Take 81 mg by mouth daily.    Marland Kitchen atorvastatin (LIPITOR) 20 MG tablet Take 1 tablet by mouth daily.  . citalopram (CELEXA) 20 MG tablet Take 1 tablet by mouth daily.  . diphenhydramine-acetaminophen (TYLENOL PM) 25-500 MG TABS Take 1 tablet by mouth at bedtime as needed.  . methotrexate (RHEUMATREX) 2.5 MG tablet Take 5 tablets by mouth Once a week.   Marland Kitchen oxymetazoline (AFRIN) 0.05 % nasal spray Place 2 sprays into the nose as needed.  . sotalol (BETAPACE) 80 MG tablet Take 1 tablet by mouth 2 (two) times daily. And a second time when needed.  . Triamcinolone Acetonide (TRIAMCINOLONE IN ABSORBASE) 0.05 % OINT Apply topically as needed.  . warfarin (COUMADIN) 2.5 MG tablet Take 2.5 mg by mouth as directed.  . [DISCONTINUED] metoprolol (TOPROL-XL) 50 MG 24 hr tablet Take 50 mg by mouth daily.    . budesonide-formoterol (SYMBICORT) 160-4.5 MCG/ACT inhaler Inhale 2 puffs into the lungs 2 (two) times daily.  Marland Kitchen dextromethorphan-guaiFENesin (MUCINEX DM) 30-600 MG per 12 hr tablet Take 1 tablet by mouth every 12 (twelve) hours as needed.  . hydrocodone-acetaminophen (LORCET-HD) 5-500 MG per capsule Take 1 capsule by mouth every 6 (six) hours as needed.

## 2013-01-21 NOTE — Assessment & Plan Note (Signed)
Tobacco use  3-10 min smoking cessation counseling given

## 2013-02-02 ENCOUNTER — Ambulatory Visit: Payer: Medicare Other | Admitting: Critical Care Medicine

## 2013-02-13 ENCOUNTER — Ambulatory Visit (INDEPENDENT_AMBULATORY_CARE_PROVIDER_SITE_OTHER): Payer: Medicare Other | Admitting: Pharmacist

## 2013-02-13 ENCOUNTER — Other Ambulatory Visit: Payer: Self-pay | Admitting: Critical Care Medicine

## 2013-02-13 DIAGNOSIS — L408 Other psoriasis: Secondary | ICD-10-CM | POA: Diagnosis not present

## 2013-02-13 DIAGNOSIS — I4891 Unspecified atrial fibrillation: Secondary | ICD-10-CM

## 2013-02-13 DIAGNOSIS — Z79899 Other long term (current) drug therapy: Secondary | ICD-10-CM | POA: Diagnosis not present

## 2013-02-13 DIAGNOSIS — IMO0002 Reserved for concepts with insufficient information to code with codable children: Secondary | ICD-10-CM | POA: Diagnosis not present

## 2013-02-13 LAB — POCT INR: INR: 1.6

## 2013-03-07 ENCOUNTER — Ambulatory Visit (INDEPENDENT_AMBULATORY_CARE_PROVIDER_SITE_OTHER): Payer: Medicare Other | Admitting: Pharmacist

## 2013-03-07 DIAGNOSIS — R059 Cough, unspecified: Secondary | ICD-10-CM | POA: Diagnosis not present

## 2013-03-07 DIAGNOSIS — I4891 Unspecified atrial fibrillation: Secondary | ICD-10-CM | POA: Diagnosis not present

## 2013-03-07 DIAGNOSIS — M545 Low back pain, unspecified: Secondary | ICD-10-CM | POA: Diagnosis not present

## 2013-03-07 DIAGNOSIS — R05 Cough: Secondary | ICD-10-CM | POA: Diagnosis not present

## 2013-03-07 DIAGNOSIS — J029 Acute pharyngitis, unspecified: Secondary | ICD-10-CM | POA: Diagnosis not present

## 2013-03-07 DIAGNOSIS — J441 Chronic obstructive pulmonary disease with (acute) exacerbation: Secondary | ICD-10-CM | POA: Diagnosis not present

## 2013-03-07 LAB — POCT INR: INR: 2.6

## 2013-03-13 ENCOUNTER — Telehealth: Payer: Self-pay | Admitting: Cardiology

## 2013-03-13 NOTE — Telephone Encounter (Signed)
Spoke with patient and rescheduled PT/INR for 03/16/13.  No change in dose.

## 2013-03-13 NOTE — Telephone Encounter (Signed)
New message        Pt would like for Riki RuskJeremy to call her about her appt

## 2013-03-17 ENCOUNTER — Ambulatory Visit (INDEPENDENT_AMBULATORY_CARE_PROVIDER_SITE_OTHER): Payer: Medicare Other | Admitting: *Deleted

## 2013-03-17 DIAGNOSIS — I4891 Unspecified atrial fibrillation: Secondary | ICD-10-CM | POA: Diagnosis not present

## 2013-03-17 LAB — POCT INR: INR: 1.9

## 2013-05-10 ENCOUNTER — Ambulatory Visit (INDEPENDENT_AMBULATORY_CARE_PROVIDER_SITE_OTHER): Payer: Medicare Other | Admitting: Pharmacist

## 2013-05-10 DIAGNOSIS — I4891 Unspecified atrial fibrillation: Secondary | ICD-10-CM | POA: Diagnosis not present

## 2013-05-10 LAB — POCT INR: INR: 1.4

## 2013-05-12 ENCOUNTER — Other Ambulatory Visit: Payer: Self-pay

## 2013-05-12 MED ORDER — SOTALOL HCL 80 MG PO TABS: 80.0000 mg | ORAL_TABLET | Freq: Two times a day (BID) | ORAL | Status: DC

## 2013-05-15 ENCOUNTER — Encounter: Payer: Self-pay | Admitting: Cardiology

## 2013-05-15 DIAGNOSIS — L408 Other psoriasis: Secondary | ICD-10-CM | POA: Diagnosis not present

## 2013-05-15 DIAGNOSIS — Z79899 Other long term (current) drug therapy: Secondary | ICD-10-CM | POA: Diagnosis not present

## 2013-05-16 ENCOUNTER — Other Ambulatory Visit: Payer: Self-pay | Admitting: *Deleted

## 2013-05-16 MED ORDER — WARFARIN SODIUM 2.5 MG PO TABS: ORAL_TABLET | ORAL | Status: DC

## 2013-05-24 ENCOUNTER — Ambulatory Visit (INDEPENDENT_AMBULATORY_CARE_PROVIDER_SITE_OTHER): Payer: Medicare Other | Admitting: Pharmacist

## 2013-05-24 DIAGNOSIS — I4891 Unspecified atrial fibrillation: Secondary | ICD-10-CM | POA: Diagnosis not present

## 2013-05-24 LAB — POCT INR: INR: 1.7

## 2013-06-14 ENCOUNTER — Ambulatory Visit (INDEPENDENT_AMBULATORY_CARE_PROVIDER_SITE_OTHER): Payer: Medicare Other | Admitting: *Deleted

## 2013-06-14 DIAGNOSIS — I4891 Unspecified atrial fibrillation: Secondary | ICD-10-CM | POA: Diagnosis not present

## 2013-06-14 LAB — POCT INR: INR: 3

## 2013-06-21 ENCOUNTER — Encounter: Payer: Self-pay | Admitting: Cardiology

## 2013-06-21 ENCOUNTER — Telehealth: Payer: Self-pay | Admitting: Cardiology

## 2013-06-21 ENCOUNTER — Other Ambulatory Visit: Payer: Self-pay | Admitting: Family Medicine

## 2013-06-21 DIAGNOSIS — E039 Hypothyroidism, unspecified: Secondary | ICD-10-CM | POA: Diagnosis not present

## 2013-06-21 DIAGNOSIS — I10 Essential (primary) hypertension: Secondary | ICD-10-CM | POA: Diagnosis not present

## 2013-06-21 DIAGNOSIS — I2510 Atherosclerotic heart disease of native coronary artery without angina pectoris: Secondary | ICD-10-CM | POA: Diagnosis not present

## 2013-06-21 DIAGNOSIS — F331 Major depressive disorder, recurrent, moderate: Secondary | ICD-10-CM | POA: Diagnosis not present

## 2013-06-21 DIAGNOSIS — J4489 Other specified chronic obstructive pulmonary disease: Secondary | ICD-10-CM | POA: Diagnosis not present

## 2013-06-21 DIAGNOSIS — M545 Low back pain, unspecified: Secondary | ICD-10-CM | POA: Diagnosis not present

## 2013-06-21 DIAGNOSIS — Z79899 Other long term (current) drug therapy: Secondary | ICD-10-CM | POA: Diagnosis not present

## 2013-06-21 DIAGNOSIS — J449 Chronic obstructive pulmonary disease, unspecified: Secondary | ICD-10-CM | POA: Diagnosis not present

## 2013-06-21 DIAGNOSIS — M8580 Other specified disorders of bone density and structure, unspecified site: Secondary | ICD-10-CM

## 2013-06-21 DIAGNOSIS — Z1231 Encounter for screening mammogram for malignant neoplasm of breast: Secondary | ICD-10-CM

## 2013-06-21 DIAGNOSIS — N390 Urinary tract infection, site not specified: Secondary | ICD-10-CM | POA: Diagnosis not present

## 2013-06-21 NOTE — Telephone Encounter (Signed)
New Message:  Pt states she went to her PcP today and her BP was up... Pt is asking if she should take a whole metoprolol pill tonight. Pt states she believes her BP 186/96... Pt is requesting a call back to discuss this further

## 2013-06-21 NOTE — Telephone Encounter (Signed)
Follow up     Told nurse wrong bp reading.  The correct one is 150/90 and heart rate is 56

## 2013-06-21 NOTE — Telephone Encounter (Signed)
Spoke with patient who states her PCP told her today that her BP was elevated at 186/96.  Patient wants to know if she can take another half of her Metoprolol tonight; patient c/o dizziness recently and wonders if it is because her BP has been elevated.  I advised patient that due to her low heart rate I cannot advise her to take more Metoprolol.  Patient states she is going to try to check her pulse and find out what it was at her PCP's office.  I advised patient to call back with this information if she is able to obtain it.  I am routing message to Dr. Anne FuSkains for additional advice.

## 2013-06-22 NOTE — Telephone Encounter (Signed)
I have read Dr. Debroah BallerFulp's note (PCP). I would suggest adding lisinopril 5mg  PO QD. Have her follow up in 2 weeks with me or in APP clinic (check BMET at that time). Thanks  Donato SchultzMark Kayen Grabel, MD

## 2013-06-27 MED ORDER — LISINOPRIL 5 MG PO TABS: 5.0000 mg | ORAL_TABLET | Freq: Every day | ORAL | Status: DC

## 2013-06-27 NOTE — Telephone Encounter (Signed)
Spoke with patient and advised her of Dr. Anne FuSkains' recommendation to start Lisinopril 5 mg once daily and to come in for ov and lab.  Patient scheduled for 5/14.  Patient verbalized understanding and agreement.

## 2013-06-28 ENCOUNTER — Ambulatory Visit (INDEPENDENT_AMBULATORY_CARE_PROVIDER_SITE_OTHER): Payer: Medicare Other | Admitting: Pharmacist

## 2013-06-28 DIAGNOSIS — I4891 Unspecified atrial fibrillation: Secondary | ICD-10-CM

## 2013-06-28 LAB — POCT INR: INR: 4.3

## 2013-07-03 ENCOUNTER — Ambulatory Visit (INDEPENDENT_AMBULATORY_CARE_PROVIDER_SITE_OTHER): Payer: Medicare Other | Admitting: Pharmacist

## 2013-07-03 DIAGNOSIS — I4891 Unspecified atrial fibrillation: Secondary | ICD-10-CM | POA: Diagnosis not present

## 2013-07-03 LAB — POCT INR: INR: 2

## 2013-07-13 ENCOUNTER — Ambulatory Visit (INDEPENDENT_AMBULATORY_CARE_PROVIDER_SITE_OTHER): Payer: Medicare Other | Admitting: Cardiology

## 2013-07-13 ENCOUNTER — Encounter: Payer: Self-pay | Admitting: Cardiology

## 2013-07-13 ENCOUNTER — Ambulatory Visit (INDEPENDENT_AMBULATORY_CARE_PROVIDER_SITE_OTHER): Payer: Medicare Other | Admitting: *Deleted

## 2013-07-13 VITALS — BP 117/56 | HR 57 | Ht 62.0 in | Wt 201.0 lb

## 2013-07-13 DIAGNOSIS — J449 Chronic obstructive pulmonary disease, unspecified: Secondary | ICD-10-CM

## 2013-07-13 DIAGNOSIS — I4891 Unspecified atrial fibrillation: Secondary | ICD-10-CM | POA: Diagnosis not present

## 2013-07-13 DIAGNOSIS — E785 Hyperlipidemia, unspecified: Secondary | ICD-10-CM | POA: Diagnosis not present

## 2013-07-13 DIAGNOSIS — I10 Essential (primary) hypertension: Secondary | ICD-10-CM | POA: Diagnosis not present

## 2013-07-13 DIAGNOSIS — Z5181 Encounter for therapeutic drug level monitoring: Secondary | ICD-10-CM

## 2013-07-13 DIAGNOSIS — Z7189 Other specified counseling: Secondary | ICD-10-CM | POA: Insufficient documentation

## 2013-07-13 DIAGNOSIS — Z7901 Long term (current) use of anticoagulants: Secondary | ICD-10-CM

## 2013-07-13 DIAGNOSIS — F17200 Nicotine dependence, unspecified, uncomplicated: Secondary | ICD-10-CM

## 2013-07-13 LAB — POCT INR: INR: 3.8

## 2013-07-13 NOTE — Patient Instructions (Signed)
Your physician recommends that you continue on your current medications as directed. Please refer to the Current Medication list given to you today.  Your physician wants you to follow-up in: 6 months with Dr. Skains. You will receive a reminder letter in the mail two months in advance. If you don't receive a letter, please call our office to schedule the follow-up appointment.  

## 2013-07-13 NOTE — Progress Notes (Signed)
[address] [place], [place]  [address] Phone: [phone] Fax:  [phone]  Date:  07/13/2013   ID:  Gabrielle ReveringVirginia W [name], DOB 14-Oct-1945, MRN [id number]  PCP:  Cain SaupeFULP, CAMMIE, MD   History of Present Illness: [name] is a 68 y.o. female with coronary artery disease status post bypass surgery in 2010, paroxysmal atrial fibrillation on chronic anticoagulation, antiarrhythmics sotalol, EF 45-50%. COPD. Obesity. Previous visit she describes shortness of breath, fatigue, headaches with palpitations after using her inhalers.  Feels palpitations when she really exerts herself. Pulling in garbage can. When sat down heart was pounding. She admits that she is not taking her sotalol all the time. Sometimes she only takes it once a day. She's done a great job with weight loss, 25 pounds.   She continues to lose weight. No chest pain. She was upset with my delay in her appointment. I apologized. She accepted.   Wt Readings from Last 3 Encounters:  07/13/13 201 lb (91.173 kg)  01/19/13 205 lb 12.8 oz (93.35 kg)  01/19/13 207 lb (93.895 kg)     Past Medical History  Diagnosis Date  . Chronic headache   . Hyperlipidemia   . Asthma   . Chronic bronchitis   . Heart attack   . Arthritis   . Hypertension   . Depression   . COPD (chronic obstructive pulmonary disease)   . Atrial fibrillation with controlled ventricular response 11/2009    Past Surgical History  Procedure Laterality Date  . Cesarean section      x2  . Total knee arthroplasty    . Coronary artery bypass graft  2011    Current Outpatient Prescriptions  Medication Sig Dispense Refill  . ALPRAZolam (XANAX) 0.5 MG tablet Take 0.5 mg by mouth 2 (two) times daily as needed.       Marland Kitchen. aspirin 81 MG tablet Take 81 mg by mouth daily.        Marland Kitchen. atorvastatin (LIPITOR) 20 MG tablet Take 1 tablet by mouth daily.      . budesonide-formoterol (SYMBICORT) 160-4.5 MCG/ACT inhaler Inhale 2 puffs into the lungs 2  (two) times daily.  10.2 g  6  . citalopram (CELEXA) 20 MG tablet Take 1 tablet by mouth daily.      Marland Kitchen. dextromethorphan-guaiFENesin (MUCINEX DM) 30-600 MG per 12 hr tablet Take 1 tablet by mouth every 12 (twelve) hours as needed.      . diphenhydramine-acetaminophen (TYLENOL PM) 25-500 MG TABS Take 1 tablet by mouth at bedtime as needed.      Marland Kitchen. lisinopril (PRINIVIL,ZESTRIL) 5 MG tablet Take 1 tablet (5 mg total) by mouth daily.  90 tablet  3  . methotrexate (RHEUMATREX) 2.5 MG tablet Take 4 tablets by mouth Once a week.       . metoprolol succinate (TOPROL-XL) 50 MG 24 hr tablet Take 1 tablet (50 mg total) by mouth daily.  30 tablet  4  . oxymetazoline (AFRIN) 0.05 % nasal spray Place 2 sprays into the nose as needed.      Marland Kitchen. PROAIR HFA 108 (90 BASE) MCG/ACT inhaler INHALE 2 PUFFS INTO THE LUNGS EVERY 6 (SIX) HOURS AS NEEDED.  8.5 each  3  . sotalol (BETAPACE) 80 MG tablet Take 1 tablet (80 mg total) by mouth 2 (two) times daily. And a second time when needed.  75 tablet  3  . traMADol (ULTRAM) 50 MG tablet Take by mouth every 6 (six) hours as needed.      .Marland Kitchen  Triamcinolone Acetonide (TRIAMCINOLONE IN ABSORBASE) 0.05 % OINT Apply topically as needed.      . warfarin (COUMADIN) 2.5 MG tablet 1 tablet on all days except 1/2 tablet on Sunday take as directed by coumadin clinic  35 tablet  1   No current facility-administered medications for this visit.    Allergies:    Allergies  Allergen Reactions  . Penicillins     REACTION: hives  . Procaine Hcl     REACTION: palpatations    Social History:  The patient  reports that she has been smoking Cigarettes.  She has been smoking about 0.10 packs per day. She has never used smokeless tobacco.   ROS:  Please see the history of present illness.   Cough Denies any syncope, bleeding, orthopnea, PND    PHYSICAL EXAM: VS:  Ht 5\' 2"  (1.575 m)  Wt 201 lb (91.173 kg)  BMI 36.75 kg/m2 Well nourished, well developed, in no acute distress HEENT:  normal Neck: no JVD Cardiac: Sinus bradycardia, regular; no murmur Lungs:  Mildly decreased air movement, no active wheezing Abd: soft, nontender, no hepatomegaly Ext: no edema Skin: warm and dry Neuro: no focal abnormalities noted  EKG:  Sinus bradycardia rate 46, low voltage, nonspecific T-wave changes     ASSESSMENT AND PLAN:  1. AFIB - recently wore a monitor event. Currently sinus bradycardia. I will decreased her metoprolol to 25 mg once a day. Continue with sotalol. Honestly, she states that she takes a sotalol usually only at night. Currently in normal rhythm. Very rarely does she feel palpitations. 2. Chronic anticoagulation-continue with warfarin. [employer], [place]. Therapeutic, prior lab work reviewed. Will stop ASA. 3. Obesity-great Job with weight loss, approximately 25 pounds since December. 4. COPD-Dr. Delford FieldWright, currently with cough. Encouraged use of medications  Signed, Donato SchultzMark Skains, MD [facility]  07/13/2013 2:04 PM

## 2013-07-14 ENCOUNTER — Ambulatory Visit
Admission: RE | Admit: 2013-07-14 | Discharge: 2013-07-14 | Disposition: A | Discharge status: Home / Self Care | Payer: Medicare Other | Source: Ambulatory Visit | Attending: Family Medicine | Admitting: Family Medicine

## 2013-07-14 ENCOUNTER — Other Ambulatory Visit: Payer: Self-pay | Admitting: Family Medicine

## 2013-07-14 ENCOUNTER — Telehealth: Payer: Self-pay | Admitting: Cardiology

## 2013-07-14 DIAGNOSIS — E2839 Other primary ovarian failure: Secondary | ICD-10-CM

## 2013-07-14 DIAGNOSIS — Z1231 Encounter for screening mammogram for malignant neoplasm of breast: Secondary | ICD-10-CM

## 2013-07-14 DIAGNOSIS — Z13820 Encounter for screening for osteoporosis: Secondary | ICD-10-CM | POA: Diagnosis not present

## 2013-07-14 DIAGNOSIS — N390 Urinary tract infection, site not specified: Secondary | ICD-10-CM | POA: Diagnosis not present

## 2013-07-14 DIAGNOSIS — M545 Low back pain, unspecified: Secondary | ICD-10-CM | POA: Diagnosis not present

## 2013-07-14 DIAGNOSIS — M8580 Other specified disorders of bone density and structure, unspecified site: Secondary | ICD-10-CM

## 2013-07-14 NOTE — Telephone Encounter (Signed)
Spoke with pt, she saw primary MD today and he rx Cipro 250mg  BID x 3 days.  Urine sent out for culture.  Advised no dosage adjustment needed for Cipro 250mg  BID x 3 days, but if MD changes abx, increases dosage or length of tx call back and notify us.  Pt verbalized understanding.

## 2013-07-14 NOTE — Telephone Encounter (Signed)
New message     Pt is on cipro for UTI.  She is also on coumadin.  INR was 3.8.  Will pt need her coumadin dosage adjusted?

## 2013-07-17 ENCOUNTER — Ambulatory Visit: Payer: PRIVATE HEALTH INSURANCE | Admitting: Cardiology

## 2013-08-01 ENCOUNTER — Ambulatory Visit
Admission: RE | Admit: 2013-08-01 | Discharge: 2013-08-01 | Disposition: A | Discharge status: Home / Self Care | Payer: Medicare Other | Source: Ambulatory Visit | Attending: Family Medicine | Admitting: Family Medicine

## 2013-08-01 ENCOUNTER — Ambulatory Visit (INDEPENDENT_AMBULATORY_CARE_PROVIDER_SITE_OTHER): Payer: Medicare Other | Admitting: Pharmacist

## 2013-08-01 ENCOUNTER — Telehealth: Payer: Self-pay | Admitting: Cardiology

## 2013-08-01 ENCOUNTER — Other Ambulatory Visit: Payer: Self-pay | Admitting: Family Medicine

## 2013-08-01 DIAGNOSIS — M545 Low back pain, unspecified: Secondary | ICD-10-CM

## 2013-08-01 DIAGNOSIS — I4891 Unspecified atrial fibrillation: Secondary | ICD-10-CM

## 2013-08-01 DIAGNOSIS — M47817 Spondylosis without myelopathy or radiculopathy, lumbosacral region: Secondary | ICD-10-CM | POA: Diagnosis not present

## 2013-08-01 DIAGNOSIS — Z5181 Encounter for therapeutic drug level monitoring: Secondary | ICD-10-CM

## 2013-08-01 DIAGNOSIS — M4310 Spondylolisthesis, site unspecified: Secondary | ICD-10-CM | POA: Diagnosis not present

## 2013-08-01 LAB — POCT INR: INR: 3.9

## 2013-08-01 NOTE — Telephone Encounter (Signed)
New problem   Pt need to speak to [place] concerning her levels that was 3.9 that was checked at [facility]. Please call pt.

## 2013-08-02 NOTE — Telephone Encounter (Signed)
° ° ° °  Pt needs a call back please from Gabrielle [name] she wants to know wants wrong with her?

## 2013-08-02 NOTE — Telephone Encounter (Signed)
She wanted to know if I knew results from her x-ray from yesterday.  I told her she would need to call the physician who ordered it.  She agreed to do this.

## 2013-08-04 ENCOUNTER — Telehealth: Payer: Self-pay | Admitting: Cardiology

## 2013-08-04 NOTE — Telephone Encounter (Signed)
New problem    Pt called and stated she would like to speak to J Smart please.  Today please.

## 2013-08-04 NOTE — Telephone Encounter (Signed)
Patient states she had a bruise form on her leg today, but is only the size of her finger, and wanted to know if she needs to do anything differently.  I advised her to continued warfarin 2.5 mg qd except 1.25 mg Wednesday and Friday, and recheck INR in a couple of days as scheduled. She was agreeable to this.

## 2013-08-08 ENCOUNTER — Telehealth: Payer: Self-pay | Admitting: Cardiology

## 2013-08-08 ENCOUNTER — Other Ambulatory Visit: Payer: Self-pay | Admitting: Family Medicine

## 2013-08-08 DIAGNOSIS — M549 Dorsalgia, unspecified: Secondary | ICD-10-CM

## 2013-08-08 NOTE — Telephone Encounter (Signed)
New problem   Pt has a strep infection and is on an antibiotic. Please call pt concerning this matter.

## 2013-08-08 NOTE — Telephone Encounter (Signed)
Patient on day #2/#7 of Levaquin 500 mg qd.  Patient notified to continue warfarin 2.5 mg qd except 1.25 mg Wednesday and Friday and recheck INR in 3 days as she doesn't think she can get out of the house before then.

## 2013-08-11 ENCOUNTER — Ambulatory Visit (INDEPENDENT_AMBULATORY_CARE_PROVIDER_SITE_OTHER): Payer: Medicare Other | Admitting: *Deleted

## 2013-08-11 DIAGNOSIS — Z5181 Encounter for therapeutic drug level monitoring: Secondary | ICD-10-CM | POA: Diagnosis not present

## 2013-08-11 DIAGNOSIS — I4891 Unspecified atrial fibrillation: Secondary | ICD-10-CM | POA: Diagnosis not present

## 2013-08-11 LAB — POCT INR: INR: 3.6

## 2013-08-15 ENCOUNTER — Encounter: Payer: Self-pay | Admitting: *Deleted

## 2013-08-16 ENCOUNTER — Ambulatory Visit
Admission: RE | Admit: 2013-08-16 | Discharge: 2013-08-16 | Disposition: A | Discharge status: Home / Self Care | Payer: Medicare Other | Source: Ambulatory Visit | Attending: Family Medicine | Admitting: Family Medicine

## 2013-08-16 DIAGNOSIS — M4310 Spondylolisthesis, site unspecified: Secondary | ICD-10-CM | POA: Diagnosis not present

## 2013-08-16 DIAGNOSIS — M47817 Spondylosis without myelopathy or radiculopathy, lumbosacral region: Secondary | ICD-10-CM | POA: Diagnosis not present

## 2013-08-16 DIAGNOSIS — M48061 Spinal stenosis, lumbar region without neurogenic claudication: Secondary | ICD-10-CM | POA: Diagnosis not present

## 2013-08-16 DIAGNOSIS — M549 Dorsalgia, unspecified: Secondary | ICD-10-CM

## 2013-08-18 ENCOUNTER — Other Ambulatory Visit: Payer: Self-pay | Admitting: Cardiology

## 2013-08-21 ENCOUNTER — Ambulatory Visit (INDEPENDENT_AMBULATORY_CARE_PROVIDER_SITE_OTHER): Payer: Medicare Other | Admitting: Pharmacist

## 2013-08-21 DIAGNOSIS — Z5181 Encounter for therapeutic drug level monitoring: Secondary | ICD-10-CM | POA: Diagnosis not present

## 2013-08-21 DIAGNOSIS — I4891 Unspecified atrial fibrillation: Secondary | ICD-10-CM | POA: Diagnosis not present

## 2013-08-21 LAB — POCT INR: INR: 1.5

## 2013-08-21 MED ORDER — WARFARIN SODIUM 2.5 MG PO TABS: ORAL_TABLET | ORAL | Status: DC

## 2013-08-28 ENCOUNTER — Telehealth: Payer: Self-pay | Admitting: Cardiology

## 2013-08-28 DIAGNOSIS — M545 Low back pain, unspecified: Secondary | ICD-10-CM | POA: Diagnosis not present

## 2013-08-28 DIAGNOSIS — R82998 Other abnormal findings in urine: Secondary | ICD-10-CM | POA: Diagnosis not present

## 2013-08-28 DIAGNOSIS — L03818 Cellulitis of other sites: Secondary | ICD-10-CM | POA: Diagnosis not present

## 2013-08-28 DIAGNOSIS — B029 Zoster without complications: Secondary | ICD-10-CM | POA: Diagnosis not present

## 2013-08-28 DIAGNOSIS — L02818 Cutaneous abscess of other sites: Secondary | ICD-10-CM | POA: Diagnosis not present

## 2013-08-28 DIAGNOSIS — W57XXXA Bitten or stung by nonvenomous insect and other nonvenomous arthropods, initial encounter: Secondary | ICD-10-CM | POA: Diagnosis not present

## 2013-08-28 DIAGNOSIS — T148 Other injury of unspecified body region: Secondary | ICD-10-CM | POA: Diagnosis not present

## 2013-08-28 DIAGNOSIS — R319 Hematuria, unspecified: Secondary | ICD-10-CM | POA: Diagnosis not present

## 2013-08-28 NOTE — Telephone Encounter (Signed)
Spoke with pt and she states had tic bite on  Friday has raised area on back of neck and has rash top of her head. Seen by MD and placed on Valacyclovir HCl  1 gm bid times 10 days and also on Doxycycline 100mg  bid times 10 days  Started today also states thinks still has UTI but has not been  placed  on any new meds for this . Pt instructed will need to see her sooner due to these medications and appt made to be seen on Thursday July 2nd and she states understanding.

## 2013-08-28 NOTE — Telephone Encounter (Signed)
New problem     Pt called and needs a call back about her medications.   Pt saw her PCP today and has some questions.

## 2013-08-30 DIAGNOSIS — W57XXXA Bitten or stung by nonvenomous insect and other nonvenomous arthropods, initial encounter: Secondary | ICD-10-CM | POA: Diagnosis not present

## 2013-08-30 DIAGNOSIS — L259 Unspecified contact dermatitis, unspecified cause: Secondary | ICD-10-CM | POA: Diagnosis not present

## 2013-08-30 DIAGNOSIS — T148 Other injury of unspecified body region: Secondary | ICD-10-CM | POA: Diagnosis not present

## 2013-09-06 ENCOUNTER — Ambulatory Visit (INDEPENDENT_AMBULATORY_CARE_PROVIDER_SITE_OTHER): Payer: Medicare Other | Admitting: Vascular Surgery

## 2013-09-06 ENCOUNTER — Encounter: Payer: Self-pay | Admitting: Vascular Surgery

## 2013-09-06 ENCOUNTER — Ambulatory Visit (HOSPITAL_COMMUNITY)
Admission: RE | Admit: 2013-09-06 | Discharge: 2013-09-06 | Disposition: A | Discharge status: Home / Self Care | Payer: Medicare Other | Source: Ambulatory Visit | Attending: Vascular Surgery | Admitting: Vascular Surgery

## 2013-09-06 ENCOUNTER — Ambulatory Visit (INDEPENDENT_AMBULATORY_CARE_PROVIDER_SITE_OTHER): Payer: Medicare Other | Admitting: *Deleted

## 2013-09-06 VITALS — BP 139/61 | HR 52 | Ht 62.0 in | Wt 197.4 lb

## 2013-09-06 DIAGNOSIS — I4891 Unspecified atrial fibrillation: Secondary | ICD-10-CM | POA: Diagnosis not present

## 2013-09-06 DIAGNOSIS — Z5181 Encounter for therapeutic drug level monitoring: Secondary | ICD-10-CM

## 2013-09-06 DIAGNOSIS — I77811 Abdominal aortic ectasia: Secondary | ICD-10-CM | POA: Diagnosis not present

## 2013-09-06 LAB — POCT INR: INR: 2.6

## 2013-09-06 NOTE — Progress Notes (Signed)
Referred by: Cain Saupeammie Fulp, MD [address] [place], [place] [address]  Reason for referral: Abdominal aortic ectasia  History of Present Illness  The patient is a 68 y.o. ([birth year]) female who presents with chief complaint: back pain.  Patient was in the process of evaluation for back pain with a lumbar MRI.  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  The patient does no history of embolic episodes from the AAA.  The patient's risk factors for AAA included: active smoking and age.  The patient actively smoke cigarettes.  There is no family history of aneurysmal disease.  Past Medical History  Diagnosis Date  . Chronic headache   . Hyperlipidemia   . Asthma   . Chronic bronchitis   . Heart attack   . Arthritis   . Hypertension   . Depression   . COPD (chronic obstructive pulmonary disease)   . Atrial fibrillation with controlled ventricular response 11/2009  . Anemia   . Atrial fibrillation     Past Surgical History  Procedure Laterality Date  . Cesarean section      x2  . Total knee arthroplasty    . Coronary artery bypass graft  2011  . Cad-cabg      x7, brief post-op Atrial Fib  . Joint replacement Bilateral   . Tonsillectomy      History   Social History  . Marital Status: Divorced    Spouse Name: N/A    Number of Children: N/A  . Years of Education: N/A   Occupational History  . retired Clinical biochemistcustomer service [employer]   Social History Main Topics  . Smoking status: Current Some Day Smoker -- 0.10 packs/day    Types: Cigarettes  . Smokeless tobacco: Never Used     Comment: 1 cig in past 4 days   . Alcohol Use: No  . Drug Use: No  . Sexual Activity: Not on file   Other Topics Concern  . Not on file   Social History Narrative  . No narrative on file    Family History  Problem Relation Age of Onset  . Heart disease Father     before age 70  . Hypertension Father   . Heart attack Father   . Lung cancer  Maternal Grandfather   . Lung cancer Paternal Grandfather   . Diabetes Mother   . Hypertension Mother   . Heart disease Mother     before age 70  . Hypertension Daughter     Current Outpatient Prescriptions on File Prior to Visit  Medication Sig Dispense Refill  . ALPRAZolam (XANAX) 0.5 MG tablet Take 0.5 mg by mouth 2 (two) times daily as needed.       Marland Kitchen. atorvastatin (LIPITOR) 20 MG tablet Take 1 tablet by mouth daily.      . budesonide-formoterol (SYMBICORT) 160-4.5 MCG/ACT inhaler Inhale 2 puffs into the lungs 2 (two) times daily.  10.2 g  6  . citalopram (CELEXA) 20 MG tablet Take 1 tablet by mouth daily.      Marland Kitchen. dextromethorphan-guaiFENesin (MUCINEX DM) 30-600 MG per 12 hr tablet Take 1 tablet by mouth every 12 (twelve) hours as needed.      . diphenhydramine-acetaminophen (TYLENOL PM) 25-500 MG TABS Take 1 tablet by mouth at bedtime as needed.      Marland Kitchen. lisinopril (PRINIVIL,ZESTRIL) 5 MG tablet Take 1 tablet (5 mg total) by mouth daily.  90 tablet  3  . methotrexate (RHEUMATREX)  2.5 MG tablet Take 4 tablets by mouth Once a week.       . metoprolol succinate (TOPROL-XL) 50 MG 24 hr tablet Take 1 tablet (50 mg total) by mouth daily.  30 tablet  4  . oxymetazoline (AFRIN) 0.05 % nasal spray Place 2 sprays into the nose as needed.      Marland Kitchen. PROAIR HFA 108 (90 BASE) MCG/ACT inhaler INHALE 2 PUFFS INTO THE LUNGS EVERY 6 (SIX) HOURS AS NEEDED.  8.5 each  3  . sotalol (BETAPACE) 80 MG tablet Take 1 tablet (80 mg total) by mouth 2 (two) times daily. And a second time when needed.  75 tablet  3  . traMADol (ULTRAM) 50 MG tablet Take by mouth every 6 (six) hours as needed.      . Triamcinolone Acetonide (TRIAMCINOLONE IN ABSORBASE) 0.05 % OINT Apply topically as needed.      . warfarin (COUMADIN) 2.5 MG tablet take as directed by coumadin clinic  35 tablet  2   No current facility-administered medications on file prior to visit.    Allergies  Allergen Reactions  . Penicillins     REACTION: hives   . Procaine Hcl     REACTION: palpatations     REVIEW OF SYSTEMS:  (Positives checked otherwise negative)  CARDIOVASCULAR:  []  chest pain, []  chest pressure, [x]  palpitations, [x]  shortness of breath when laying flat, [x]  shortness of breath with exertion,  [x]  pain in feet when walking, []  pain in feet when laying flat, []  history of blood clot in veins (DVT), []  history of phlebitis, []  swelling in legs, []  varicose veins  PULMONARY:  [x]  productive cough, [x]  asthma, [x]  wheezing  NEUROLOGIC:  []  weakness in arms or legs, []  numbness in arms or legs, []  difficulty speaking or slurred speech, []  temporary loss of vision in one eye, [x]  dizziness  HEMATOLOGIC:  []  bleeding problems, []  problems with blood clotting too easily  MUSCULOSKEL:  []  joint pain, []  joint swelling  GASTROINTEST:  []  vomiting blood, []  blood in stool     GENITOURINARY:  [x]  burning with urination, [x]  blood in urine  PSYCHIATRIC:  [x]  history of major depression  INTEGUMENTARY:  [x]  rashes, []  ulcers  CONSTITUTIONAL:  []  fever, []  chills  For VQI Use Only  PRE-ADM LIVING: Home  AMB STATUS: Ambulatory  CAD Sx: History of MI, but no symptoms No MI within 6 months  PRIOR CHF: None  STRESS TEST: [x]  No, [ ]  Normal, [ ]  + ischemia, [ ]  + MI, [ ]  Both   Physical Examination  Filed Vitals:   09/06/13 1333  BP: 139/61  Pulse: 52  Height: 5\' 2"  (1.575 m)  Weight: 197 lb 6.4 oz (89.54 kg)  SpO2: 94%   Body mass index is 36.1 kg/(m^2).  General: A&O x 3, WD, obese  Head: [place]/AT  Ear/Nose/Throat: Hearing grossly intact, nares w/o erythema or drainage, oropharynx w/o Erythema/Exudate  Eyes: PERRLA, EOMI  Neck: Supple, no nuchal rigidity, no palpable LAD  Pulmonary: Sym exp, good air movt, CTAB, no rales, rhonchi, & wheezing  Cardiac: RRR, Nl S1, S2, no Murmurs, rubs or gallops  Vascular: Vessel Right Left  Radial Palpable Palpable  Brachial Palpable Palpable  Carotid Palpable, without  bruit Palpable, without bruit  Aorta Not palpable N/A  Femoral Palpable Palpable  Popliteal Not palpable Not palpable  PT Palpable Palpable  DP Palpable Palpable   Gastrointestinal: soft, NTND, -G/R, - HSM, - masses, - CVAT B  Musculoskeletal:  M/S 5/5 throughout , Extremities without ischemic changes   Neurologic: CN 2-12 intact , Pain and light touch intact in extremities , Motor exam as listed above  Psychiatric: Judgment intact, Mood & affect appropriate for pt's clinical situation  Dermatologic: See M/S exam for extremity exam, no rashes otherwise noted  Lymph : No Cervical, Axillary, or Inguinal lymphadenopathy   Non-Invasive Vascular Imaging  AAA Duplex (Date: 09/06/2013 )  Largest diameter: 2.4 cm  R CIA: 1.1 cm x 1.3 cm  L CIA: 0.9 cm x 1.1 cm  Outside Studies/Documentation 5 pages of outside documents were reviewed including: outside MRI report and outpatient clinic chart.  Medical Decision Making  The patient is a 68 y.o. female who presents with: abdominal aortic ectasia.   Based on this patient's exam and diagnostic studies, he needs repeat aortic duplex in 3 years.  The threshold for repair is AAA size > 5.5 cm, growth > 1 cm/yr, and symptomatic status.  I emphasized the importance of maximal medical management including strict control of blood pressure, blood glucose, and lipid levels, antiplatelet agents, obtaining regular exercise, and cessation of smoking.    Thank you for allowing [place] to participate in this patient's care.  Leonides Sake, MD [facility] Office: [phone] Pager: [phone]  09/06/2013, 1:58 PM

## 2013-09-12 DIAGNOSIS — Z79899 Other long term (current) drug therapy: Secondary | ICD-10-CM | POA: Diagnosis not present

## 2013-09-12 DIAGNOSIS — W57XXXA Bitten or stung by nonvenomous insect and other nonvenomous arthropods, initial encounter: Secondary | ICD-10-CM | POA: Diagnosis not present

## 2013-09-12 DIAGNOSIS — T148 Other injury of unspecified body region: Secondary | ICD-10-CM | POA: Diagnosis not present

## 2013-09-12 DIAGNOSIS — L408 Other psoriasis: Secondary | ICD-10-CM | POA: Diagnosis not present

## 2013-09-20 ENCOUNTER — Ambulatory Visit (INDEPENDENT_AMBULATORY_CARE_PROVIDER_SITE_OTHER): Payer: Medicare Other | Admitting: Pharmacist

## 2013-09-20 ENCOUNTER — Telehealth: Payer: Self-pay | Admitting: Pharmacist

## 2013-09-20 DIAGNOSIS — I4891 Unspecified atrial fibrillation: Secondary | ICD-10-CM

## 2013-09-20 DIAGNOSIS — Z5181 Encounter for therapeutic drug level monitoring: Secondary | ICD-10-CM

## 2013-09-20 DIAGNOSIS — M549 Dorsalgia, unspecified: Secondary | ICD-10-CM | POA: Diagnosis not present

## 2013-09-20 DIAGNOSIS — M47817 Spondylosis without myelopathy or radiculopathy, lumbosacral region: Secondary | ICD-10-CM | POA: Diagnosis not present

## 2013-09-20 LAB — POCT INR: INR: 1.7

## 2013-09-20 NOTE — Telephone Encounter (Signed)
Message copied by Lou MinerSMART, Malena Timpone G on Wed Sep 20, 2013  4:49 PM ------      Message from: Donato SchultzSKAINS, MARK C      Created: Wed Sep 20, 2013  4:45 PM      Regarding: RE: epidural / warfarin       OK with plan to hold coumadin.       Donato SchultzSKAINS, MARK, MD            ----- Message -----         From: Gaspar SkeetersJeremy G Payge Eppes, RPH         Sent: 09/20/2013  11:26 AM           To: Donato SchultzMark Skains, MD      Subject: epidural / warfarin                                      Patient is having an epidural by Dr. Ollen BowlHarkins on 09/28/13.  Dr. Ollen BowlHarkins already told patient to hold warfarin 5 days prior.  She is on warfarin for h/o AFib.  No h/o embolic event.  Any objection to holding 5 days prior?              Riki RuskJeremy       ------

## 2013-09-20 NOTE — Telephone Encounter (Signed)
Patient notified

## 2013-09-26 ENCOUNTER — Encounter: Payer: Medicare Other | Admitting: Vascular Surgery

## 2013-09-28 DIAGNOSIS — M47817 Spondylosis without myelopathy or radiculopathy, lumbosacral region: Secondary | ICD-10-CM | POA: Diagnosis not present

## 2013-11-08 ENCOUNTER — Ambulatory Visit (INDEPENDENT_AMBULATORY_CARE_PROVIDER_SITE_OTHER): Payer: Medicare Other | Admitting: Pharmacist

## 2013-11-08 DIAGNOSIS — I4891 Unspecified atrial fibrillation: Secondary | ICD-10-CM | POA: Diagnosis not present

## 2013-11-08 DIAGNOSIS — Z5181 Encounter for therapeutic drug level monitoring: Secondary | ICD-10-CM

## 2013-11-08 LAB — POCT INR: INR: 1.8

## 2013-11-30 ENCOUNTER — Other Ambulatory Visit: Payer: Self-pay

## 2013-11-30 ENCOUNTER — Telehealth: Payer: Self-pay | Admitting: Cardiology

## 2013-11-30 MED ORDER — ATORVASTATIN CALCIUM 20 MG PO TABS: 20.0000 mg | ORAL_TABLET | Freq: Every day | ORAL | Status: DC

## 2013-11-30 NOTE — Telephone Encounter (Signed)
New problem   Pt stated pharmacy has faxed 2 request and she is totally out . Pt want you to check this out for her.

## 2013-11-30 NOTE — Telephone Encounter (Signed)
Returned patient's phone call for clarification.  Patient st she is out of atorvastatin and needs some more. Refill sent to pharmacy. Patient st she wants to discontinue Coumadin and start Eliquis, for convenience.  Told patient I'd send to Dr. Anne FuSkains for review. Pt also wanted me to relay her apologies for how she treated Dr. Anne FuSkains at her last visit.

## 2013-12-03 NOTE — Telephone Encounter (Signed)
I am fine with her transitioning to Eliquis. She will need basic metabolic profile and CBC. I will forward to Kennon RoundsSally in Coumadin clinic to complete.  Donato SchultzSKAINS, Eliu Batch, MD

## 2013-12-04 NOTE — Telephone Encounter (Signed)
Spoke with pt.  She is aware of Dr. Anne FuSkains recommendations.  Will need INR check as well before transition.  She states she is not feeling well right now and will call whenever she feels better to make an appt.  She is aware she is overdue.

## 2013-12-06 ENCOUNTER — Ambulatory Visit: Payer: Medicare Other | Admitting: Adult Health

## 2013-12-25 ENCOUNTER — Ambulatory Visit (INDEPENDENT_AMBULATORY_CARE_PROVIDER_SITE_OTHER): Payer: Medicare Other

## 2013-12-25 DIAGNOSIS — I4891 Unspecified atrial fibrillation: Secondary | ICD-10-CM | POA: Diagnosis not present

## 2013-12-25 DIAGNOSIS — Z79899 Other long term (current) drug therapy: Secondary | ICD-10-CM | POA: Diagnosis not present

## 2013-12-25 DIAGNOSIS — Z5181 Encounter for therapeutic drug level monitoring: Secondary | ICD-10-CM

## 2013-12-25 DIAGNOSIS — L400 Psoriasis vulgaris: Secondary | ICD-10-CM | POA: Diagnosis not present

## 2013-12-25 LAB — POCT INR: INR: 1.4

## 2013-12-25 MED ORDER — APIXABAN 5 MG PO TABS: 5.0000 mg | ORAL_TABLET | Freq: Two times a day (BID) | ORAL | Status: DC

## 2013-12-25 NOTE — Patient Instructions (Signed)

## 2013-12-26 ENCOUNTER — Encounter: Payer: Self-pay | Admitting: Cardiology

## 2014-01-08 ENCOUNTER — Telehealth: Payer: Self-pay | Admitting: Critical Care Medicine

## 2014-01-08 NOTE — Telephone Encounter (Signed)
Message not needed. Pt made appt.

## 2014-01-15 ENCOUNTER — Ambulatory Visit (INDEPENDENT_AMBULATORY_CARE_PROVIDER_SITE_OTHER): Payer: Medicare Other | Admitting: Critical Care Medicine

## 2014-01-15 ENCOUNTER — Encounter: Payer: Self-pay | Admitting: Critical Care Medicine

## 2014-01-15 VITALS — BP 130/78 | HR 53 | Temp 98.4°F | Ht 62.5 in | Wt 209.2 lb

## 2014-01-15 DIAGNOSIS — J449 Chronic obstructive pulmonary disease, unspecified: Secondary | ICD-10-CM | POA: Diagnosis not present

## 2014-01-15 DIAGNOSIS — F17210 Nicotine dependence, cigarettes, uncomplicated: Secondary | ICD-10-CM

## 2014-01-15 DIAGNOSIS — F17200 Nicotine dependence, unspecified, uncomplicated: Secondary | ICD-10-CM

## 2014-01-15 MED ORDER — ALBUTEROL SULFATE HFA 108 (90 BASE) MCG/ACT IN AERS: INHALATION_SPRAY | RESPIRATORY_TRACT | Status: DC

## 2014-01-15 MED ORDER — LEVOFLOXACIN 500 MG PO TABS: 500.0000 mg | ORAL_TABLET | Freq: Every day | ORAL | Status: DC

## 2014-01-15 MED ORDER — LOSARTAN POTASSIUM 50 MG PO TABS: 50.0000 mg | ORAL_TABLET | Freq: Every day | ORAL | Status: DC

## 2014-01-15 MED ORDER — BUDESONIDE-FORMOTEROL FUMARATE 160-4.5 MCG/ACT IN AERO: 2.0000 | INHALATION_SPRAY | Freq: Two times a day (BID) | RESPIRATORY_TRACT | Status: AC

## 2014-01-15 MED ORDER — PREDNISONE 10 MG PO TABS: ORAL_TABLET | ORAL | Status: DC

## 2014-01-15 NOTE — Progress Notes (Signed)
Subjective:    Patient ID: Gabrielle PalauVirginia W Asman, female    DOB: [birth year], 68 y.o.   MRN: [id number]  HPI   01/15/2014 Chief Complaint  Patient presents with  . Acute Visit    increased congestion and cough with light yellow mucus, increased SOB, and wheezing x 1 month.  No chest tightness/pain or fever.  Last seen 12/2012.  Ill for 4 weeks, got ill with incontact with grandchildren.  Pt on MTX.  ??liver dz.   Notes more cough and more wheezing.   Pt denies any significant sore throat, nasal congestion or excess secretions, fever, chills, sweats, unintended weight loss, pleurtic or exertional chest pain, orthopnea PND, or leg swelling Pt denies any increase in rescue therapy over baseline, denies waking up needing it or having any early am or nocturnal exacerbations of coughing/wheezing/or dyspnea. Pt also denies any obvious fluctuation in symptoms with  weather or environmental change or other alleviating or aggravating factors    Review of Systems Constitutional:   No  weight loss, night sweats,  Fevers, chills, fatigue, lassitude. HEENT:   No headaches,  Difficulty swallowing,  Tooth/dental problems,  Sore throat,                No sneezing, itching, ear ache, nasal congestion, post nasal drip,   CV:  No chest pain,  Orthopnea, PND, swelling in lower extremities, anasarca, dizziness, palpitations  GI  Notes  Heartburn,notes  indigestion, no abdominal pain, nausea, vomiting, diarrhea, change in bowel habits, loss of appetite  Resp: Notes  shortness of breath with exertion not   at rest.  No  excess mucus, no  productive cough,  No  non-productive cough,  No coughing up of blood.  Nos a  change in color of mucus.  No some  wheezing.  No chest wall deformity  Skin: no rash or lesions.  GU: no dysuria, change in color of urine, no urgency or frequency.  No flank pain.  MS:  No joint pain or swelling.  No decreased range of motion.  No back pain.  Psych:  No change in mood or affect.  No depression or anxiety.  No memory loss.     Objective:   Physical ExamBP 130/78 mmHg  Pulse 53  Temp(Src) 98.4 F (36.9 C) (Oral)  Ht 5' 2.5" (1.588 m)  Wt 209 lb 3.2 oz (94.892 kg)  BMI 37.63 kg/m2  SpO2 90%  Gen: Pleasant, well-nourished, in no distress,  normal affect  ENT: No lesions,  mouth clear,  oropharynx clear, no postnasal drip  Neck: No JVD, no TMG, no carotid bruits  Lungs: No use of accessory muscles, no dullness to percussion,, poor airflow  Cardiovascular: RRR, heart sounds normal, no murmur or gallops, no peripheral edema  Abdomen: soft and NT, no HSM,  BS normal  Musculoskeletal: No deformities, no cyanosis or clubbing  Neuro: alert, non focal  Skin: Warm, no lesions or rashes        Assessment & Plan:   C O P D  Gold C  Gold stage C COPD with ongoing tobacco use The patient was instructed as to proper use of her inhaled medications 3-10 minutes of smoking cessation counseling was given of the patient  NICOTINE ADDICTION Smoking cessation counseling was given to the patient    Updated Medication List Outpatient Encounter Prescriptions as of 01/15/2014  Medication Sig  . albuterol (PROAIR HFA) 108 (90 BASE) MCG/ACT inhaler INHALE 2 PUFFS INTO THE LUNGS EVERY 6 (SIX) HOURS  AS NEEDED.  Marland Kitchen. ALPRAZolam (XANAX) 0.5 MG tablet Take 0.5 mg by mouth 2 (two) times daily as needed.   Marland Kitchen. apixaban (ELIQUIS) 5 MG TABS tablet Take 1 tablet (5 mg total) by mouth 2 (two) times daily.  . budesonide-formoterol (SYMBICORT) 160-4.5 MCG/ACT inhaler Inhale 2 puffs into the lungs 2 (two) times daily.  . citalopram (CELEXA) 20 MG tablet Take 1 tablet by mouth daily.  . clobetasol cream (TEMOVATE) 0.05 % as needed.  Marland Kitchen. dextromethorphan-guaiFENesin (MUCINEX DM) 30-600 MG per 12 hr tablet Take 1 tablet by mouth every 12 (twelve) hours as needed.  Marland Kitchen. MELATONIN PO Take by mouth at bedtime as needed.  . methotrexate (RHEUMATREX) 2.5 MG tablet Take 4 tablets by mouth Once a  week.   . metoprolol succinate (TOPROL-XL) 50 MG 24 hr tablet Take 1 tablet (50 mg total) by mouth daily.  Marland Kitchen. oxymetazoline (AFRIN) 0.05 % nasal spray Place 2 sprays into the nose as needed.  . sotalol (BETAPACE) 80 MG tablet Take 1 tablet (80 mg total) by mouth 2 (two) times daily. And a second time when needed.  . traMADol (ULTRAM) 50 MG tablet Take by mouth every 6 (six) hours as needed.  . [DISCONTINUED] budesonide-formoterol (SYMBICORT) 160-4.5 MCG/ACT inhaler Inhale 2 puffs into the lungs 2 (two) times daily.  . [DISCONTINUED] lisinopril (PRINIVIL,ZESTRIL) 5 MG tablet Take 1 tablet (5 mg total) by mouth daily.  . [DISCONTINUED] PROAIR HFA 108 (90 BASE) MCG/ACT inhaler INHALE 2 PUFFS INTO THE LUNGS EVERY 6 (SIX) HOURS AS NEEDED.  Marland Kitchen. atorvastatin (LIPITOR) 20 MG tablet Take 1 tablet (20 mg total) by mouth daily.  Marland Kitchen. levofloxacin (LEVAQUIN) 500 MG tablet Take 1 tablet (500 mg total) by mouth daily.  Marland Kitchen. losartan (COZAAR) 50 MG tablet Take 1 tablet (50 mg total) by mouth daily.  . predniSONE (DELTASONE) 10 MG tablet Take 4 for two days three for two days two for two days one for two days  . [DISCONTINUED] ciprofloxacin (CIPRO) 250 MG tablet   . [DISCONTINUED] diphenhydramine-acetaminophen (TYLENOL PM) 25-500 MG TABS Take 1 tablet by mouth at bedtime as needed.  . [DISCONTINUED] levofloxacin (LEVAQUIN) 500 MG tablet   . [DISCONTINUED] Triamcinolone Acetonide (TRIAMCINOLONE IN ABSORBASE) 0.05 % OINT Apply topically as needed.

## 2014-01-15 NOTE — Patient Instructions (Signed)
Take levaquin 500mg  daily for 5 days Take prednisone 10mg  Take 4 for two days three for two days two for two days one for two days Stop lisinopril  Start losartan 50mg  daily Refills on symbicort and proair sent  Focus on reducing cigarettes, use nicotine replacment therapy Return 4 months

## 2014-01-16 NOTE — Assessment & Plan Note (Signed)
Smoking cessation counseling was given to the patient 

## 2014-01-16 NOTE — Assessment & Plan Note (Signed)
Gold stage C COPD with ongoing tobacco use The patient was instructed as to proper use of her inhaled medications 3-10 minutes of smoking cessation counseling was given of the patient

## 2014-01-18 ENCOUNTER — Telehealth: Payer: Self-pay | Admitting: Cardiology

## 2014-01-18 NOTE — Telephone Encounter (Signed)
New message         Pt has questions about medications

## 2014-01-18 NOTE — Telephone Encounter (Signed)
Pt called because she wanted to know if Dr Anne FuSkains was contacted about her medication was changed from Lisinopril to Losartan.  Advised I do not see documentation that Dr Anne FuSkains was contacted however I feel sure Dr Anne FuSkains will not had a problem with it.   Pt also wanted a new pt appt scheduled for her daughter for the evaluation of HTN.  Appt was scheduled and pt is aware.  She thanked me for my time.

## 2014-01-19 DIAGNOSIS — I2510 Atherosclerotic heart disease of native coronary artery without angina pectoris: Secondary | ICD-10-CM | POA: Diagnosis not present

## 2014-01-19 DIAGNOSIS — E785 Hyperlipidemia, unspecified: Secondary | ICD-10-CM | POA: Diagnosis not present

## 2014-01-19 DIAGNOSIS — F17211 Nicotine dependence, cigarettes, in remission: Secondary | ICD-10-CM | POA: Diagnosis not present

## 2014-01-19 DIAGNOSIS — N390 Urinary tract infection, site not specified: Secondary | ICD-10-CM | POA: Diagnosis not present

## 2014-01-19 DIAGNOSIS — I10 Essential (primary) hypertension: Secondary | ICD-10-CM | POA: Diagnosis not present

## 2014-01-19 DIAGNOSIS — J449 Chronic obstructive pulmonary disease, unspecified: Secondary | ICD-10-CM | POA: Diagnosis not present

## 2014-01-19 DIAGNOSIS — G4700 Insomnia, unspecified: Secondary | ICD-10-CM | POA: Diagnosis not present

## 2014-01-19 DIAGNOSIS — E039 Hypothyroidism, unspecified: Secondary | ICD-10-CM | POA: Diagnosis not present

## 2014-01-19 DIAGNOSIS — M545 Low back pain: Secondary | ICD-10-CM | POA: Diagnosis not present

## 2014-01-30 ENCOUNTER — Encounter: Payer: Self-pay | Admitting: Cardiology

## 2014-02-02 ENCOUNTER — Other Ambulatory Visit: Payer: Self-pay | Admitting: *Deleted

## 2014-02-02 MED ORDER — METOPROLOL SUCCINATE ER 25 MG PO TB24: 25.0000 mg | ORAL_TABLET | Freq: Every day | ORAL | Status: DC

## 2014-02-07 ENCOUNTER — Ambulatory Visit (INDEPENDENT_AMBULATORY_CARE_PROVIDER_SITE_OTHER): Payer: Medicare Other | Admitting: Cardiology

## 2014-02-07 ENCOUNTER — Encounter: Payer: Self-pay | Admitting: Cardiology

## 2014-02-07 VITALS — BP 132/88 | HR 61 | Ht 62.5 in | Wt 199.0 lb

## 2014-02-07 DIAGNOSIS — E785 Hyperlipidemia, unspecified: Secondary | ICD-10-CM | POA: Diagnosis not present

## 2014-02-07 DIAGNOSIS — I2583 Coronary atherosclerosis due to lipid rich plaque: Secondary | ICD-10-CM

## 2014-02-07 DIAGNOSIS — E669 Obesity, unspecified: Secondary | ICD-10-CM | POA: Insufficient documentation

## 2014-02-07 DIAGNOSIS — I4891 Unspecified atrial fibrillation: Secondary | ICD-10-CM | POA: Diagnosis not present

## 2014-02-07 DIAGNOSIS — I2510 Atherosclerotic heart disease of native coronary artery without angina pectoris: Secondary | ICD-10-CM | POA: Diagnosis not present

## 2014-02-07 DIAGNOSIS — I10 Essential (primary) hypertension: Secondary | ICD-10-CM

## 2014-02-07 LAB — CBC
HCT: 46.2 % — ABNORMAL HIGH (ref 36.0–46.0)
Hemoglobin: 15.4 g/dL — ABNORMAL HIGH (ref 12.0–15.0)
MCHC: 33.5 g/dL (ref 30.0–36.0)
MCV: 100.4 fl — ABNORMAL HIGH (ref 78.0–100.0)
Platelets: 229 10*3/uL (ref 150.0–400.0)
RBC: 4.6 Mil/uL (ref 3.87–5.11)
RDW: 14.2 % (ref 11.5–15.5)
WBC: 11.8 10*3/uL — ABNORMAL HIGH (ref 4.0–10.5)

## 2014-02-07 MED ORDER — LOSARTAN POTASSIUM 25 MG PO TABS: 25.0000 mg | ORAL_TABLET | Freq: Every day | ORAL | Status: DC

## 2014-02-07 NOTE — Patient Instructions (Addendum)
Your physician recommends that you have lab work today: CBC  Your physician wants you to follow-up in: 6 MONTHS with Dr Anne FuSkains.  You will receive a reminder letter in the mail two months in advance. If you don't receive a letter, please call our office to schedule the follow-up appointment.  In reviewing the pt's medication list she states that she has not had a prescription filled for Losartan in the past.  Per Dr Anne FuSkains he would like the pt to start Losartan 25mg  once a day.

## 2014-02-07 NOTE — Progress Notes (Signed)
[address] [place], [place]  [address] Phone: [phone] Fax:  [phone]  Date:  02/07/2014   ID:  Claudie ReveringVirginia W [name], DOB 14-Jul-1945, MRN [id number]  PCP:  Gabrielle SaupeFULP, CAMMIE, MD   History of Present Illness: Gabrielle PalauVirginia W [name] is a 68 y.o. female  with coronary artery disease status post bypass surgery in 2010, paroxysmal atrial fibrillation on chronic anticoagulation, antiarrhythmics sotalol, EF 45-50%. COPD. Obesity.  Overall today she is doing well from a heart perspective. No palpitations, no syncopal, no chest pain. She has been struggling with her daughter, alcohol.  Antibiotic, prednisone for bronchitis.  Wt Readings from Last 3 Encounters:  02/07/14 199 lb (90.266 kg)  01/15/14 209 lb 3.2 oz (94.892 kg)  09/06/13 197 lb 6.4 oz (89.54 kg)     Past Medical History  Diagnosis Date  . Chronic headache   . Hyperlipidemia   . Asthma   . Chronic bronchitis   . Heart attack   . Arthritis   . Hypertension   . Depression   . COPD (chronic obstructive pulmonary disease)   . Atrial fibrillation with controlled ventricular response 11/2009  . Anemia   . Atrial fibrillation     Past Surgical History  Procedure Laterality Date  . Cesarean section      x2  . Total knee arthroplasty    . Coronary artery bypass graft  2011  . Cad-cabg      x7, brief post-op Atrial Fib  . Joint replacement Bilateral   . Tonsillectomy      Current Outpatient Prescriptions  Medication Sig Dispense Refill  . albuterol (PROAIR HFA) 108 (90 BASE) MCG/ACT inhaler INHALE 2 PUFFS INTO THE LUNGS EVERY 6 (SIX) HOURS AS NEEDED. 18 g 3  . ALPRAZolam (XANAX) 0.5 MG tablet Take 0.5 mg by mouth 2 (two) times daily as needed.     Marland Kitchen. apixaban (ELIQUIS) 5 MG TABS tablet Take 1 tablet (5 mg total) by mouth 2 (two) times daily. 60 tablet 3  . atorvastatin (LIPITOR) 20 MG tablet Take 1 tablet (20 mg total) by mouth daily. 30 tablet 3  . budesonide-formoterol (SYMBICORT) 160-4.5 MCG/ACT  inhaler Inhale 2 puffs into the lungs 2 (two) times daily. 10.2 g 6  . citalopram (CELEXA) 20 MG tablet Take 1 tablet by mouth daily.    . clobetasol cream (TEMOVATE) 0.05 % as needed.  3  . losartan (COZAAR) 50 MG tablet Take 1 tablet (50 mg total) by mouth daily. 30 tablet 11  . MELATONIN PO Take by mouth at bedtime as needed.    . methotrexate (RHEUMATREX) 2.5 MG tablet Take 4 tablets by mouth Once a week.     . metoprolol succinate (TOPROL-XL) 25 MG 24 hr tablet Take 1 tablet (25 mg total) by mouth daily. 30 tablet 0  . oxymetazoline (AFRIN) 0.05 % nasal spray Place 2 sprays into the nose as needed.    . predniSONE (DELTASONE) 10 MG tablet Take 4 for two days three for two days two for two days one for two days 20 tablet 0  . sotalol (BETAPACE) 80 MG tablet Take 1 tablet (80 mg total) by mouth 2 (two) times daily. And a second time when needed. 75 tablet 3  . traMADol (ULTRAM) 50 MG tablet Take by mouth every 6 (six) hours as needed.     No current facility-administered medications for this visit.    Allergies:    Allergies  Allergen Reactions  .  Penicillins     REACTION: hives  . Procaine Hcl     REACTION: palpatations    Social History:  The patient  reports that she has been smoking Cigarettes.  She has been smoking about 0.20 packs per day. She has never used smokeless tobacco. She reports that she does not drink alcohol or use illicit drugs.   Family History  Problem Relation Age of Onset  . Heart disease Father     before age 70  . Hypertension Father   . Heart attack Father   . Lung cancer Maternal Grandfather   . Lung cancer Paternal Grandfather   . Diabetes Mother   . Hypertension Mother   . Heart disease Mother     before age 70  . Hypertension Daughter     ROS:  Please see the history of present illness.   Denies any bleeding, syncope, orthopnea, PND   All other systems reviewed and negative.   PHYSICAL EXAM: VS:  BP 132/88 mmHg  Pulse 61  Ht 5' 2.5"  (1.588 m)  Wt 199 lb (90.266 kg)  BMI 35.80 kg/m2 Well nourished, well developed, in no acute distress HEENT: normal, [place]/AT, EOMI Neck: no JVD, normal carotid upstroke, no bruit Cardiac:  normal S1, S2; RRR; no murmur Lungs:  clear to auscultation bilaterally, no wheezing, rhonchi or rales Abd: soft, nontender, no hepatomegaly, no bruits Ext: no edema, 2+ distal pulses Skin: warm and dry GU: deferred Neuro: no focal abnormalities noted, AAO x 3  EKG:  02/07/14-sinus rhythm, 61, inferior infarct pattern, nonspecific ST-T wave changes. Poor R-wave progression.   Labs: 01/19/14-creatinine 0.85, potassium 4.3, TSH 0.67, glucose 139  ASSESSMENT AND PLAN:  1. Paroxysmal atrial fibrillation-newly started apixaban. Doing well. Renal function normal. Checking CBC. No bleeding. Continue with metoprolol. Sotalol. 2. Chronic anticoagulation-as above. 3. Coronary artery disease-status post bypass surgery. Overall no anginal symptoms. 4. Obesity-encourage weight loss. 5. Depression-going through hard time with her daughter, [id number], alcohol. 6. Hyperlipidemia-continue with atorvastatin.  Signed, Donato SchultzMark Skains, MD [facility]  02/07/2014 11:00 AM

## 2014-02-07 NOTE — Addendum Note (Signed)
Addended by: Iona CoachBROWN, LAUREN W on: 02/07/2014 11:19 AM   Modules accepted: Orders

## 2014-02-08 ENCOUNTER — Other Ambulatory Visit: Payer: Self-pay | Admitting: Cardiology

## 2014-02-09 ENCOUNTER — Telehealth: Payer: Self-pay | Admitting: *Deleted

## 2014-02-09 NOTE — Telephone Encounter (Signed)
noted 

## 2014-02-09 NOTE — Telephone Encounter (Signed)
Check into Dulera 200 two puff bid or breo one puff daily 100.

## 2014-02-09 NOTE — Telephone Encounter (Signed)
Called and spoke to GilbyMichelle at [phone]. Both Dulera 200 and Breo 100 are covered and do not need a PA.  Dr. Delford FieldWright please advise.

## 2014-02-09 NOTE — Telephone Encounter (Signed)
I spoke with the pt and she states that she will have new insurance in Jan and it will cover Symbicort so she would like to stay with it. She states that she has enough to last her through Jan. I will forward this to Dr. Delford FieldWright as an FYI that pt will stay on symbicort. Gabrielle CurieJennifer Felipe [name], CMA

## 2014-02-09 NOTE — Telephone Encounter (Signed)
Try breo 100 one puff daily

## 2014-02-09 NOTE — Telephone Encounter (Signed)
Dr Delford FieldWright please advise on medication change. Pt was prescribed Symbicort 160mg  originally, insurance is no Community education officerloner covering. PA can be done to try and get coverage or medication can be changed. Alternative per insurance company is QVAR. Please advise. Thanks.   PA # [phone] Pt ID [id number]

## 2014-02-12 DIAGNOSIS — M47816 Spondylosis without myelopathy or radiculopathy, lumbar region: Secondary | ICD-10-CM | POA: Diagnosis not present

## 2014-02-12 DIAGNOSIS — M545 Low back pain: Secondary | ICD-10-CM | POA: Diagnosis not present

## 2014-03-14 ENCOUNTER — Other Ambulatory Visit: Payer: Self-pay | Admitting: Cardiology

## 2014-04-03 ENCOUNTER — Telehealth: Payer: Self-pay | Admitting: Cardiology

## 2014-04-03 NOTE — Telephone Encounter (Signed)
New msg         Pt is taking Eliquis and has been having constant vertigo.  Feels as if she's falling even while laying down.  Is this related to the medication? Have there been other pt complaints?  Please return pt call.

## 2014-04-03 NOTE — Telephone Encounter (Signed)
Left message for pt that I will call back in the AM

## 2014-04-04 NOTE — Telephone Encounter (Signed)
Left message for pt to call back to discuss her vertigo.  She has been on Eliquis since 11/2013.

## 2014-04-06 NOTE — Telephone Encounter (Signed)
Left another message for pt to call back if she is still having problems.

## 2014-04-10 ENCOUNTER — Other Ambulatory Visit: Payer: Self-pay | Admitting: Cardiology

## 2014-04-13 ENCOUNTER — Other Ambulatory Visit: Payer: Self-pay | Admitting: Cardiology

## 2014-05-06 ENCOUNTER — Other Ambulatory Visit: Payer: Self-pay | Admitting: Cardiology

## 2014-06-27 ENCOUNTER — Telehealth: Payer: Self-pay | Admitting: Cardiology

## 2014-06-27 NOTE — Telephone Encounter (Signed)
Spoke with pt who reports she has stopped all of her medications because she feels like she is over medicated.  She stopped her medications 2 days ago and reports feeling much better.  Pt states she she has had no will, no energy, wants to sleep all the time and doesn't care about anything anymore.  She states she has been having a lot of problems with vertigo "the whole world spins when I turn my head."  She is extremely tearful.  She states she knows she is depressed as well.  She has not been to an ENT of had her PCP check her for vertigo or had further treatment for her depression.  Advised pt she is putting herself at a huge risk by stopping all of her medications and stressed the importance of at least restarting Eliquis and meds she takes for control of AT Fib and depression.  She reports feeling so much better off of them she doesn't want to restart them but will consider it.  I advised her I will document this information and send it to Dr Anne FuSkains for review.  I did schedule her for f/u next week to come into the office to discuss how she is feeling and the medications she needs to be on.

## 2014-06-27 NOTE — Telephone Encounter (Signed)
Pt c/o medication issue:  1. Name of Medication: Per Pt "She has too many medications and is too medicated"  2. How are you currently taking this medication (dosage and times per day)? Various dosages for multiple meds  3. Are you having a reaction (difficulty breathing--STAT)? Listed below  4. What is your medication issue? Pt calling stating that she feels like she is overmedicated, states that medications have given her vertigo, she is scared to drive, hasn't eaten in 2 days, had diarrhea, pt stopped taking meds 3 days ago and feeling much better and wants to talk to Dr. Anne FuSkains about adjusting meds and taking her off of meds. Please call back and advise.

## 2014-06-28 NOTE — Telephone Encounter (Signed)
Thanks for update. Agree SKAINS, MARK, MD  

## 2014-06-28 NOTE — Telephone Encounter (Signed)
Left message for pt on private voicemail that Dr Anne FuSkains is aware of what she and I discussed yesterday and agrees with recommendations.  Reminded her of her appt next week and requested she call back if further questions or concerns.

## 2014-06-29 ENCOUNTER — Telehealth: Payer: Self-pay | Admitting: Cardiology

## 2014-06-29 NOTE — Telephone Encounter (Signed)
New message     FYI Calling to let Pam know she is feeling a little better.  She restarted her sotalol, eliquis and metoprolol.  She was feeling depressed but is feeling better and will keep her next appt.

## 2014-06-29 NOTE — Telephone Encounter (Signed)
Noted! Thank you

## 2014-07-04 ENCOUNTER — Ambulatory Visit (INDEPENDENT_AMBULATORY_CARE_PROVIDER_SITE_OTHER): Payer: Medicare Other | Admitting: Cardiology

## 2014-07-04 ENCOUNTER — Encounter: Payer: Self-pay | Admitting: Cardiology

## 2014-07-04 VITALS — BP 138/80 | HR 52 | Ht 62.5 in | Wt 200.0 lb

## 2014-07-04 DIAGNOSIS — I480 Paroxysmal atrial fibrillation: Secondary | ICD-10-CM

## 2014-07-04 DIAGNOSIS — I10 Essential (primary) hypertension: Secondary | ICD-10-CM | POA: Diagnosis not present

## 2014-07-04 DIAGNOSIS — E785 Hyperlipidemia, unspecified: Secondary | ICD-10-CM | POA: Diagnosis not present

## 2014-07-04 NOTE — Progress Notes (Signed)
[address] [place], [place]  [address] Phone: [phone]) [phone] Fax:  [phone]  Date:  07/04/2014   ID:  Gabrielle ReveringVirginia W [name], DOB [birth year], MRN [id number]  PCP:  Cain SaupeFULP, CAMMIE, MD   History of Present Illness: Gabrielle PalauVirginia W [name] is a 69 y.o. female  with coronary artery disease status post bypass surgery in 2010, paroxysmal atrial fibrillation on chronic anticoagulation, antiarrhythmics sotalol, EF 45-50%. COPD. Obesity.  Overall today she is doing well from a heart perspective. No palpitations, no syncopal, no chest pain. She had been struggling with her daughter, alcohol but she has recently tried to take control over her life and no longer is housing her daughter, son. As she states, she is even cut her ex-husband loose. No cigs for 3 weeks.    Wt Readings from Last 3 Encounters:  07/04/14 200 lb (90.719 kg)  02/07/14 199 lb (90.266 kg)  01/15/14 209 lb 3.2 oz (94.892 kg)     Past Medical History  Diagnosis Date  . Chronic headache   . Hyperlipidemia   . Asthma   . Chronic bronchitis   . Heart attack   . Arthritis   . Hypertension   . Depression   . COPD (chronic obstructive pulmonary disease)   . Atrial fibrillation with controlled ventricular response 11/2009  . Anemia   . Atrial fibrillation     Past Surgical History  Procedure Laterality Date  . Cesarean section      x2  . Total knee arthroplasty    . Coronary artery bypass graft  2011  . Cad-cabg      x7, brief post-op Atrial Fib  . Joint replacement Bilateral   . Tonsillectomy      Current Outpatient Prescriptions  Medication Sig Dispense Refill  . albuterol (PROAIR HFA) 108 (90 BASE) MCG/ACT inhaler INHALE 2 PUFFS INTO THE LUNGS EVERY 6 (SIX) HOURS AS NEEDED. 18 g 3  . ALPRAZolam (XANAX) 0.5 MG tablet Take 0.5 mg by mouth 2 (two) times daily as needed.     Marland Kitchen. atorvastatin (LIPITOR) 20 MG tablet TAKE 1 TABLET (20 MG TOTAL) BY MOUTH DAILY. 30 tablet 10  . budesonide-formoterol  (SYMBICORT) 160-4.5 MCG/ACT inhaler Inhale 2 puffs into the lungs 2 (two) times daily. 10.2 g 6  . citalopram (CELEXA) 20 MG tablet Take 1 tablet by mouth daily.    . clobetasol cream (TEMOVATE) 0.05 % Apply 1 application topically as needed.   3  . ELIQUIS 5 MG TABS tablet TAKE 1 TABLET (5 MG TOTAL) BY MOUTH 2 (TWO) TIMES DAILY. 60 tablet 3  . losartan (COZAAR) 50 MG tablet Take 50 mg by mouth daily.   11  . MELATONIN PO Take by mouth at bedtime as needed.    . methotrexate (RHEUMATREX) 2.5 MG tablet Take 4 tablets by mouth Once a week.     . metoprolol succinate (TOPROL-XL) 25 MG 24 hr tablet TAKE 1 TABLET (25 MG TOTAL) BY MOUTH DAILY. 30 tablet 5  . oxymetazoline (AFRIN) 0.05 % nasal spray Place 2 sprays into the nose as needed.    . predniSONE (DELTASONE) 10 MG tablet Take 4 for two days three for two days two for two days one for two days 20 tablet 0  . sotalol (BETAPACE) 80 MG tablet TAKE 1 TABLET (80 MG TOTAL) BY MOUTH 2 (TWO) TIMES DAILY. AND A SECOND TIME WHEN NEEDED. 75 tablet 3  . traMADol (ULTRAM) 50 MG tablet Take by mouth  every 6 (six) hours as needed.     No current facility-administered medications for this visit.    Allergies:    Allergies  Allergen Reactions  . Penicillins     REACTION: hives  . Procaine Hcl     REACTION: palpatations    Social History:  The patient  reports that she has been smoking Cigarettes.  She has been smoking about 0.20 packs per day. She has never used smokeless tobacco. She reports that she does not drink alcohol or use illicit drugs.   Family History  Problem Relation Age of Onset  . Heart disease Father     before age 71  . Hypertension Father   . Heart attack Father   . Lung cancer Maternal Grandfather   . Lung cancer Paternal Grandfather   . Diabetes Mother   . Hypertension Mother   . Heart disease Mother     before age 71  . Hypertension Daughter   . Stroke Paternal Grandmother     ROS:  Please see the history of present  illness.   Denies any bleeding, syncope, orthopnea, PND   All other systems reviewed and negative.   PHYSICAL EXAM: VS:  BP 138/80 mmHg  Pulse 52  Ht 5' 2.5" (1.588 m)  Wt 200 lb (90.719 kg)  BMI 35.97 kg/m2  SpO2 95% Well nourished, well developed, in no acute distress HEENT: normal, [place]/AT, EOMI Neck: no JVD, normal carotid upstroke, no bruit Cardiac:  normal S1, S2; RRR; no murmur Lungs:  clear to auscultation bilaterally, no wheezing, rhonchi or rales Abd: soft, nontender, no hepatomegaly, no bruits Ext: no edema, 2+ distal pulses Skin: warm and dry GU: deferred Neuro: no focal abnormalities noted, AAO x 3  EKG:  07/04/14-sinus rhythm, 72, nonspecific ST-T wave changes -prior 02/07/14-sinus rhythm, 61, inferior infarct pattern, nonspecific ST-T wave changes. Poor R-wave progression.   Labs: 01/19/14-creatinine 0.85, potassium 4.3, TSH 0.67, glucose 139  ASSESSMENT AND PLAN:  1. Paroxysmal atrial fibrillation-apixaban. At one point, she discontinued. She was quite depressed at the time. She is now back on this medication. Doing well. Renal function normal.  No bleeding. Continue with metoprolol. Sotalol. 2. Chronic anticoagulation-as above. 3. Coronary artery disease-status post bypass surgery. Overall no anginal symptoms. No longer taking angiotensin receptor blocker losartan. Try to encourage her to take statin. 4. Obesity-encourage weight loss. 5. Depression-improved.  6. Hyperlipidemia-trying to encourage her to take atorvastatin however she does not wish to do this.  Signed, Donato SchultzMark Skains, MD [facility]  07/04/2014 4:09 PM

## 2014-07-04 NOTE — Patient Instructions (Signed)
Medication Instructions:  Stop Atorvastatin and Losartan. Continue all other medications as listed.  Labwork: none  Testing/Procedures: none  Follow-Up: Follow up in 6 months with Dr. Anne FuSkains.  You will receive a letter in the mail 2 months before you are due.  Please call us when you receive this letter to schedule your follow up appointment.  Thank you for choosing [facility]!!

## 2014-07-16 ENCOUNTER — Telehealth: Payer: Self-pay | Admitting: Cardiology

## 2014-07-16 DIAGNOSIS — I480 Paroxysmal atrial fibrillation: Secondary | ICD-10-CM | POA: Diagnosis not present

## 2014-07-16 DIAGNOSIS — E039 Hypothyroidism, unspecified: Secondary | ICD-10-CM | POA: Diagnosis not present

## 2014-07-16 DIAGNOSIS — I10 Essential (primary) hypertension: Secondary | ICD-10-CM | POA: Diagnosis not present

## 2014-07-16 DIAGNOSIS — R350 Frequency of micturition: Secondary | ICD-10-CM | POA: Diagnosis not present

## 2014-07-16 DIAGNOSIS — R739 Hyperglycemia, unspecified: Secondary | ICD-10-CM | POA: Diagnosis not present

## 2014-07-16 DIAGNOSIS — R42 Dizziness and giddiness: Secondary | ICD-10-CM | POA: Diagnosis not present

## 2014-07-16 DIAGNOSIS — I2510 Atherosclerotic heart disease of native coronary artery without angina pectoris: Secondary | ICD-10-CM | POA: Diagnosis not present

## 2014-07-16 DIAGNOSIS — F329 Major depressive disorder, single episode, unspecified: Secondary | ICD-10-CM | POA: Diagnosis not present

## 2014-07-16 NOTE — Telephone Encounter (Signed)
Spoke with pt who reports she had an episode Saturday that was "real bad" like what she had before she had her bypass surgery.  She reports taking extra ASA and Sotatol because of palpitations and it went away after resting for awhile.  Today she was seen by Dr Jillyn HiddenFulp for a sinus infection and reports having a lot of palpitations.  Dr Jillyn HiddenFulp has referred her back to Dr Anne FuSkains for further evaluation however pt is stating she doesn't think she needs to be seen now because she feels better after seeing Dr Jillyn HiddenFulp.  Per Dr Myrtie NeitherFulp-pt had stopped her anti-depressant herself but the patient is restarting it today.  Pt denies any s/s at this time.  She is very grateful that I called and spent time talking with her.  She is very apologetic for being rude to the person she was speaking to on the phone.  She will keep her appt for now as scheduled for Friday.  She is aware to call 911 for unresolved pain prior to then.

## 2014-07-16 NOTE — Telephone Encounter (Signed)
New Message   Patient is callling to speak to nurse  Patient c/o Palpitations:  High priority if patient c/o lightheadedness and shortness of breath.  1. How long have you been having palpitations? Mainly today, and Saturday it was bad   2. Are you currently experiencing lightheadedness and shortness of breath? SOB  A little now   3. Have you checked your BP and heart rate? (document readings) yes top number is high/  146/86  52P 4. Are you experiencing any other symptoms?no not now.

## 2014-07-20 ENCOUNTER — Ambulatory Visit: Payer: Medicare Other | Admitting: Cardiology

## 2014-07-25 ENCOUNTER — Other Ambulatory Visit: Payer: Self-pay | Admitting: Cardiology

## 2014-08-10 DIAGNOSIS — L400 Psoriasis vulgaris: Secondary | ICD-10-CM | POA: Diagnosis not present

## 2014-08-10 DIAGNOSIS — Z79899 Other long term (current) drug therapy: Secondary | ICD-10-CM | POA: Diagnosis not present

## 2014-08-29 ENCOUNTER — Other Ambulatory Visit: Payer: Self-pay | Admitting: Cardiology

## 2014-09-11 ENCOUNTER — Other Ambulatory Visit: Payer: Self-pay | Admitting: Cardiology

## 2014-10-15 DIAGNOSIS — R197 Diarrhea, unspecified: Secondary | ICD-10-CM | POA: Diagnosis not present

## 2014-10-15 DIAGNOSIS — R42 Dizziness and giddiness: Secondary | ICD-10-CM | POA: Diagnosis not present

## 2014-10-15 DIAGNOSIS — R51 Headache: Secondary | ICD-10-CM | POA: Diagnosis not present

## 2014-10-15 DIAGNOSIS — M62838 Other muscle spasm: Secondary | ICD-10-CM | POA: Diagnosis not present

## 2014-10-15 DIAGNOSIS — M25512 Pain in left shoulder: Secondary | ICD-10-CM | POA: Diagnosis not present

## 2014-10-15 DIAGNOSIS — M5412 Radiculopathy, cervical region: Secondary | ICD-10-CM | POA: Diagnosis not present

## 2014-10-16 ENCOUNTER — Telehealth: Payer: Self-pay | Admitting: Cardiology

## 2014-10-16 MED ORDER — APIXABAN 5 MG PO TABS: ORAL_TABLET | ORAL | Status: DC

## 2014-10-16 MED ORDER — ATORVASTATIN CALCIUM 20 MG PO TABS: ORAL_TABLET | ORAL | Status: DC

## 2014-10-16 MED ORDER — SOTALOL HCL 80 MG PO TABS: ORAL_TABLET | ORAL | Status: DC

## 2014-10-16 MED ORDER — METOPROLOL SUCCINATE ER 25 MG PO TB24: ORAL_TABLET | ORAL | Status: DC

## 2014-10-16 NOTE — Telephone Encounter (Signed)
New Message  Pt calling to speak w/ RN- no note in system, pt stated she had a missed call yesterday from our office and requested to speak w/ the RN concerning her eliquis RX. Please call back and discuss.

## 2014-10-16 NOTE — Telephone Encounter (Signed)
Pt was needing a 90 day supply of her medications.  She has also restarted her atorvastatin 20 mg a day and an RX was sent in for that as was.  She is scheduled to be seen 11/7 by Dr Anne Fu.

## 2014-11-09 ENCOUNTER — Telehealth: Payer: Self-pay | Admitting: Cardiology

## 2014-11-09 NOTE — Telephone Encounter (Signed)
OK to restart Eliquis.  Donato Schultz, MD

## 2014-11-09 NOTE — Telephone Encounter (Signed)
Pt reports after sitting in a chair for about an hour yesterday, she got up to fix a sandwich.  She was standing in the kitchen, in her gown, and felt something.  When she looked down there was a puddle of "very thin" blood approximately 12" in diameter.  There was no blood on her clothes anywhere.  She could never determine the origin of the blood.  She did look at her vaginal and rectal areas with a mirror and reports the only thing she saw was a small place at the top of her leg.  She did not take her medication last night.  She has not had any more bleeding.  Advised pt to continue to monitor for signs of bleeding but to restart her medications.  She states understanding.  She will call back with any other questions or concerns.

## 2014-11-09 NOTE — Telephone Encounter (Signed)
New message      Pt is on eliquis.  She had a "bad" bleeding spell last night.  There was a pool of blood on the floor when she was sitting, but it was not in her urine and she did not have any cuts on her body.  Patient did not take her eliquis last night.  Should she resume her eliquis today

## 2015-01-07 ENCOUNTER — Encounter: Payer: Self-pay | Admitting: Cardiology

## 2015-01-07 ENCOUNTER — Ambulatory Visit (INDEPENDENT_AMBULATORY_CARE_PROVIDER_SITE_OTHER): Payer: Medicare Other | Admitting: Cardiology

## 2015-01-07 VITALS — BP 134/82 | HR 56 | Ht 62.5 in | Wt 205.0 lb

## 2015-01-07 DIAGNOSIS — I2510 Atherosclerotic heart disease of native coronary artery without angina pectoris: Secondary | ICD-10-CM | POA: Diagnosis not present

## 2015-01-07 DIAGNOSIS — I2583 Coronary atherosclerosis due to lipid rich plaque: Principal | ICD-10-CM

## 2015-01-07 DIAGNOSIS — I10 Essential (primary) hypertension: Secondary | ICD-10-CM | POA: Diagnosis not present

## 2015-01-07 NOTE — Progress Notes (Signed)
[address] [place], [place]  [address] Phone: [phone] Fax:  [phone]  Date:  01/07/2015   ID:  Gabrielle [name], Gabrielle [name] 03/21/1945, MRN [id number]  PCP:  [name], MD   History of Present Illness: Gabrielle [name] is a 69 y.o. female  with coronary artery disease status post bypass surgery in 2010, paroxysmal atrial fibrillation on chronic anticoagulation, antiarrhythmics sotalol, EF 45-50%. COPD. Obesity.  Overall today she is doing well from a heart perspective. No palpitations, no syncopal, no chest pain. She had been struggling with her daughter, alcohol but she has recently tried to take control over her life and no longer is housing her daughter, son. As she states, she is even cut her ex-husband loose.  [name] twin.  No cigs for 3 weeks.    Wt Readings from Last 3 Encounters:  01/07/15 205 lb (92.987 kg)  07/04/14 200 lb (90.719 kg)  02/07/14 199 lb (90.266 kg)     Past Medical History  Diagnosis Date  . Chronic headache   . Hyperlipidemia   . Asthma   . Chronic bronchitis   . Heart attack (HCC)   . Arthritis   . Hypertension   . Depression   . COPD (chronic obstructive pulmonary disease) (HCC)   . Atrial fibrillation with controlled ventricular response (HCC) 11/2009  . Anemia   . Atrial fibrillation [facility])     Past Surgical History  Procedure Laterality Date  . Cesarean section      x2  . Total knee arthroplasty    . Coronary artery bypass graft  2011  . Cad-cabg      x7, brief post-op Atrial Fib  . Joint replacement Bilateral   . Tonsillectomy      Current Outpatient Prescriptions  Medication Sig Dispense Refill  . albuterol (PROAIR HFA) 108 (90 BASE) MCG/ACT inhaler INHALE 2 PUFFS INTO THE LUNGS EVERY 6 (SIX) HOURS AS NEEDED. 18 g 3  . ALPRAZolam (XANAX) 0.5 MG tablet Take 0.5 mg by mouth 2 (two) times daily as needed.     Marland Kitchen apixaban (ELIQUIS) 5 MG TABS tablet TAKE 1 TABLET (5 MG TOTAL) BY MOUTH 2 (TWO) TIMES  DAILY. 180 tablet 0  . atorvastatin (LIPITOR) 20 MG tablet TAKE 1 TABLET (20 MG TOTAL) BY MOUTH DAILY. 90 tablet 1  . budesonide-formoterol (SYMBICORT) 160-4.5 MCG/ACT inhaler Inhale 2 puffs into the lungs 2 (two) times daily. 10.2 g 6  . citalopram (CELEXA) 20 MG tablet Take 1 tablet by mouth daily.    . clobetasol cream (TEMOVATE) 0.05 % Apply 1 application topically as needed.   3  . losartan (COZAAR) 50 MG tablet TAKE 1 TABLET (50 MG TOTAL) BY MOUTH DAILY.  11  . MELATONIN PO Take by mouth at bedtime as needed.    . methotrexate (RHEUMATREX) 2.5 MG tablet Take 4 tablets by mouth Once a week.     . metoprolol succinate (TOPROL-XL) 25 MG 24 hr tablet TAKE 1 TABLET (25 MG TOTAL) BY MOUTH DAILY. 90 tablet 1  . oxymetazoline (AFRIN) 0.05 % nasal spray Place 2 sprays into the nose as needed.    . pantoprazole (PROTONIX) 40 MG tablet     . predniSONE (DELTASONE) 10 MG tablet Take 4 for two days three for two days two for two days one for two days 20 tablet 0  . sotalol (BETAPACE) 80 MG tablet TAKE 1 TABLET (80 MG TOTAL) BY MOUTH 2 (TWO) TIMES DAILY.  AND A SECOND TIME WHEN NEEDED. 250 tablet 1  . polyethylene glycol powder (GLYCOLAX/MIRALAX) powder     . traMADol (ULTRAM) 50 MG tablet Take by mouth every 6 (six) hours as needed.     No current facility-administered medications for this visit.    Allergies:    Allergies  Allergen Reactions  . Penicillins     REACTION: hives  . Procaine Hcl     REACTION: palpatations    Social History:  The patient  reports that she has been smoking Cigarettes.  She has been smoking about 0.20 packs per day. She has never used smokeless tobacco. She reports that she does not drink alcohol or use illicit drugs.   Family History  Problem Relation Age of Onset  . Heart disease Father     before age 660  . Hypertension Father   . Heart attack Father   . Lung cancer Maternal Grandfather   . Lung cancer Paternal Grandfather   . Diabetes Mother   .  Hypertension Mother   . Heart disease Mother     before age 71  . Hypertension Daughter   . Stroke Paternal Grandmother     ROS:  Please see the history of present illness.   Denies any bleeding, syncope, orthopnea, PND   All other systems reviewed and negative.   PHYSICAL EXAM: VS:  BP 134/82 mmHg  Pulse 56  Ht 5' 2.5" (1.588 m)  Wt 205 lb (92.987 kg)  BMI 36.87 kg/m2 Well nourished, well developed, in no acute distress HEENT: normal, [place]/AT, EOMI Neck: no JVD, normal carotid upstroke, no bruit Cardiac:  normal S1, S2; RRR; no murmur Lungs:  clear to auscultation bilaterally, no wheezing, rhonchi or rales Abd: soft, nontender, no hepatomegaly, no bruits Ext: no edema, 2+ distal pulses Skin: warm and dry GU: deferred Neuro: no focal abnormalities noted, AAO x 3  EKG: Today 01/07/15-sinus bradycardia rate 54 with frequent PVCs, low amplitude P wave, poor R-wave progression, T-wave inversion in 1 and aVL. 07/04/14-sinus rhythm, 72, nonspecific ST-T wave changes -prior 02/07/14-sinus rhythm, 61, inferior infarct pattern, nonspecific ST-T wave changes. Poor R-wave progression.   Labs: 01/19/14-creatinine 0.85, potassium 4.3, TSH 0.67, glucose 139  ASSESSMENT AND PLAN:  1. Paroxysmal atrial fibrillation-apixaban. At one point, she discontinued. She was quite depressed at the time. She is now back on this medication. Doing well. Renal function normal.  No bleeding. Continue with metoprolol. Sotalol. 2. Chronic anticoagulation-as above. 3. Coronary artery disease-status post bypass surgery. Overall no anginal symptoms. No longer taking angiotensin receptor blocker losartan. Try to encourage her to take statin. 4. Obesity-encourage weight loss. 5. Depression-improved.  6. Hyperlipidemia-trying to encourage her to take atorvastatin however she does not wish to do this. 7. Abdominal discomfort-right side. She is trying to get off of the couch at her grandson's birthday party. She thinks she  may have pulled a muscle. There is no evidence of bruising. She is going to take a stool softener to see if this helps. Continue to monitor. If symptoms worsen, I encouraged her to see her primary physician or urgent care.  Signed, Donato SchultzMark Kierria Feigenbaum, MD [facility]  01/07/2015 2:58 PM

## 2015-01-07 NOTE — Patient Instructions (Signed)

## 2015-01-16 DIAGNOSIS — R1011 Right upper quadrant pain: Secondary | ICD-10-CM | POA: Diagnosis not present

## 2015-01-16 DIAGNOSIS — R350 Frequency of micturition: Secondary | ICD-10-CM | POA: Diagnosis not present

## 2015-01-16 DIAGNOSIS — M62830 Muscle spasm of back: Secondary | ICD-10-CM | POA: Diagnosis not present

## 2015-01-16 DIAGNOSIS — Z7901 Long term (current) use of anticoagulants: Secondary | ICD-10-CM | POA: Diagnosis not present

## 2015-01-16 DIAGNOSIS — R3129 Other microscopic hematuria: Secondary | ICD-10-CM | POA: Diagnosis not present

## 2015-02-14 ENCOUNTER — Other Ambulatory Visit: Payer: Self-pay | Admitting: Cardiology

## 2015-02-14 NOTE — Telephone Encounter (Signed)
Previously filled by Leola BrazilPatricia Wright MD. [facility] Fu[facility]?

## 2015-04-12 ENCOUNTER — Encounter: Payer: Self-pay | Admitting: Cardiology

## 2015-04-12 ENCOUNTER — Ambulatory Visit (INDEPENDENT_AMBULATORY_CARE_PROVIDER_SITE_OTHER): Payer: Medicare Other | Admitting: Cardiology

## 2015-04-12 VITALS — BP 128/70 | HR 48 | Ht 62.5 in | Wt 207.0 lb

## 2015-04-12 DIAGNOSIS — I480 Paroxysmal atrial fibrillation: Secondary | ICD-10-CM

## 2015-04-12 DIAGNOSIS — I10 Essential (primary) hypertension: Secondary | ICD-10-CM

## 2015-04-12 DIAGNOSIS — F17200 Nicotine dependence, unspecified, uncomplicated: Secondary | ICD-10-CM | POA: Diagnosis not present

## 2015-04-12 DIAGNOSIS — E785 Hyperlipidemia, unspecified: Secondary | ICD-10-CM

## 2015-04-12 DIAGNOSIS — Z79899 Other long term (current) drug therapy: Secondary | ICD-10-CM

## 2015-04-12 DIAGNOSIS — R0602 Shortness of breath: Secondary | ICD-10-CM

## 2015-04-12 MED ORDER — FUROSEMIDE 20 MG PO TABS: 20.0000 mg | ORAL_TABLET | Freq: Every day | ORAL | Status: DC

## 2015-04-12 NOTE — Patient Instructions (Signed)
Medication Instructions:  Please start Furosemide 20 mg a day. Continue all other medications as listed.  Labwork: Please have blood work in 1 week (BMP)  Your physician has requested that you have an echocardiogram. Echocardiography is a painless test that uses sound waves to create images of your heart. It provides your doctor with information about the size and shape of your heart and how well your heart's chambers and valves are working. This procedure takes approximately one hour. There are no restrictions for this procedure.  Follow-Up: Follow up in about 1 month with Dr Anne Fu.  Thank you for choosing [facility]!!

## 2015-04-12 NOTE — Progress Notes (Signed)
[address] [place], [place]  [address] Phone: [phone] Fax:  [phone]  Date:  04/12/2015   ID:  Gabrielle, [name] Sep 18, 1945, MRN [id number]  PCP:  [name], MD   History of Present Illness: Gabrielle [name] is a 70 y.o. female  with coronary artery disease status post bypass surgery in 2010, paroxysmal atrial fibrillation on chronic anticoagulation, antiarrhythmics sotalol, EF 45-50%. COPD. Obesity.  She had been struggling with her daughter, alcohol but she has recently tried to take control over her life and no longer is housing her daughter, son.  [name] twin.  No cigs for 3 weeks.   3-4 times a day white Phlegm.  Overall just feeling more shortness of breath. She has had a 7 pound waking. She's noticing some swelling around her ankles. She also had a right excoriation on her lower leg which bled the other night. Overall she is just feeling worse. No chest pain, no syncope.    Wt Readings from Last 3 Encounters:  04/12/15 207 lb (93.895 kg)  01/07/15 205 lb (92.987 kg)  07/04/14 200 lb (90.719 kg)     Past Medical History  Diagnosis Date  . Chronic headache   . Hyperlipidemia   . Asthma   . Chronic bronchitis   . Heart attack (HCC)   . Arthritis   . Hypertension   . Depression   . COPD (chronic obstructive pulmonary disease) (HCC)   . Atrial fibrillation with controlled ventricular response (HCC) 11/2009  . Anemia   . Atrial fibrillation [facility])     Past Surgical History  Procedure Laterality Date  . Cesarean section      x2  . Total knee arthroplasty    . Coronary artery bypass graft  2011  . Cad-cabg      x7, brief post-op Atrial Fib  . Joint replacement Bilateral   . Tonsillectomy      Current Outpatient Prescriptions  Medication Sig Dispense Refill  . albuterol (PROAIR HFA) 108 (90 BASE) MCG/ACT inhaler INHALE 2 PUFFS INTO THE LUNGS EVERY 6 (SIX) HOURS AS NEEDED. 18 g 3  . ALPRAZolam (XANAX) 0.5 MG tablet Take  0.5 mg by mouth 2 (two) times daily as needed.     Marland Kitchen apixaban (ELIQUIS) 5 MG TABS tablet TAKE 1 TABLET (5 MG TOTAL) BY MOUTH 2 (TWO) TIMES DAILY. 180 tablet 0  . atorvastatin (LIPITOR) 20 MG tablet TAKE 1 TABLET (20 MG TOTAL) BY MOUTH DAILY. 90 tablet 1  . budesonide-formoterol (SYMBICORT) 160-4.5 MCG/ACT inhaler Inhale 2 puffs into the lungs 2 (two) times daily. 10.2 g 6  . citalopram (CELEXA) 20 MG tablet Take 1 tablet by mouth daily.    . clobetasol cream (TEMOVATE) 0.05 % Apply 1 application topically as needed.   3  . losartan (COZAAR) 50 MG tablet TAKE 1 TABLET (50 MG TOTAL) BY MOUTH DAILY. 30 tablet 10  . methotrexate (RHEUMATREX) 2.5 MG tablet Take 4 tablets by mouth Once a week.     . metoprolol succinate (TOPROL-XL) 25 MG 24 hr tablet TAKE 1 TABLET (25 MG TOTAL) BY MOUTH DAILY. 90 tablet 1  . oxymetazoline (AFRIN) 0.05 % nasal spray Place 2 sprays into the nose as needed for congestion.     . pantoprazole (PROTONIX) 40 MG tablet Take 40 mg by mouth daily.     . sotalol (BETAPACE) 80 MG tablet TAKE 1 TABLET (80 MG TOTAL) BY MOUTH 2 (TWO) TIMES DAILY. AND  A SECOND TIME WHEN NEEDED. 250 tablet 1  . traMADol (ULTRAM) 50 MG tablet Take 50 mg by mouth every 6 (six) hours as needed for moderate pain.     . furosemide (LASIX) 20 MG tablet Take 1 tablet (20 mg total) by mouth daily. 30 tablet 6   No current facility-administered medications for this visit.    Allergies:    Allergies  Allergen Reactions  . Penicillins     REACTION: hives  . Procaine Hcl     REACTION: palpatations    Social History:  The patient  reports that she has been smoking Cigarettes.  She has been smoking about 0.20 packs per day. She has never used smokeless tobacco. She reports that she does not drink alcohol or use illicit drugs.   Family History  Problem Relation Age of Onset  . Heart disease Father     before age 57  . Hypertension Father   . Heart attack Father   . Lung cancer Maternal Grandfather   .  Lung cancer Paternal Grandfather   . Diabetes Mother   . Hypertension Mother   . Heart disease Mother     before age 9  . Hypertension Daughter   . Stroke Paternal Grandmother     ROS:  Please see the history of present illness.   Denies any bleeding, syncope, orthopnea, PND   All other systems reviewed and negative.   PHYSICAL EXAM: VS:  BP 128/70 mmHg  Pulse 48  Ht 5' 2.5" (1.588 m)  Wt 207 lb (93.895 kg)  BMI 37.23 kg/m2 Well nourished, well developed, in no acute distress HEENT: normal, [place]/AT, EOMI Neck: no JVD, normal carotid upstroke, no bruit Cardiac:  normal S1, S2; RRR; no murmur Lungs:  clear to auscultation bilaterally, no wheezing, rhonchi or rales Abd: soft, nontender, no hepatomegaly, no bruits Ext: trace edema, 2+ distal pulses Skin: warm and dry GU: deferred Neuro: no focal abnormalities noted, AAO x 3  EKG:  No EKG today 01/07/15-sinus bradycardia rate 54 with frequent PVCs, low amplitude P wave, poor R-wave progression, T-wave inversion in 1 and aVL. 07/04/14-sinus rhythm, 72, nonspecific ST-T wave changes -prior 02/07/14-sinus rhythm, 61, inferior infarct pattern, nonspecific ST-T wave changes. Poor R-wave progression.   Labs: 01/19/14-creatinine 0.85, potassium 4.3, TSH 0.67, glucose 139  ASSESSMENT AND PLAN:  1. Paroxysmal atrial fibrillation-apixaban. At one point, she discontinued. She was quite depressed at the time. She is now back on this medication. Doing well. Renal function normal.  No bleeding. Continue with metoprolol. Sotalol. She does feel occasional palpitations. I do hear on auscultation occasional ectopy. Sounds like PVCs as was previously described on EKG. 2. Chronic anticoagulation-as above. She has had some bleeding associated with a leg wound. Minor. 3. Coronary artery disease-status post bypass surgery. Overall no anginal symptoms. No longer taking angiotensin receptor blocker losartan. Try to encourage her to take statin. In case her  shortness of breath is an anginal equivalent, we will check an echocardiogram. I will also place her on Lasix 20 mg once a day. We will check a basic metabolic profile in one week. I will see her back in one month. 4. Obesity-encourage weight loss. 5. Depression-improved.  6. Hyperlipidemia-atorvastatin  7. Smoker - no cig in 2 weeks.   one month follow-up  Signed, Donato Schultz, MD [facility]  04/12/2015 9:59 AM

## 2015-04-15 ENCOUNTER — Other Ambulatory Visit: Payer: Self-pay | Admitting: Cardiology

## 2015-04-24 ENCOUNTER — Other Ambulatory Visit (HOSPITAL_COMMUNITY): Payer: Medicare Other

## 2015-04-24 ENCOUNTER — Other Ambulatory Visit: Payer: Medicare Other

## 2015-05-15 ENCOUNTER — Ambulatory Visit: Payer: Medicare Other | Admitting: Cardiology

## 2015-07-17 ENCOUNTER — Other Ambulatory Visit: Payer: Self-pay | Admitting: Cardiology

## 2015-07-17 MED ORDER — SOTALOL HCL 80 MG PO TABS: ORAL_TABLET | ORAL | Status: DC

## 2015-07-24 ENCOUNTER — Other Ambulatory Visit: Payer: Self-pay | Admitting: Nephrology

## 2015-07-24 ENCOUNTER — Other Ambulatory Visit: Payer: Self-pay

## 2015-07-24 DIAGNOSIS — N183 Chronic kidney disease, stage 3 unspecified: Secondary | ICD-10-CM

## 2015-07-24 MED ORDER — LOSARTAN POTASSIUM 50 MG PO TABS: 50.0000 mg | ORAL_TABLET | Freq: Every day | ORAL | Status: DC

## 2015-07-25 ENCOUNTER — Ambulatory Visit (INDEPENDENT_AMBULATORY_CARE_PROVIDER_SITE_OTHER): Payer: Medicare Other | Admitting: Cardiology

## 2015-07-25 ENCOUNTER — Encounter: Payer: Self-pay | Admitting: Cardiology

## 2015-07-25 VITALS — BP 130/68 | HR 60 | Ht 62.5 in | Wt 188.8 lb

## 2015-07-25 DIAGNOSIS — I480 Paroxysmal atrial fibrillation: Secondary | ICD-10-CM

## 2015-07-25 DIAGNOSIS — E785 Hyperlipidemia, unspecified: Secondary | ICD-10-CM

## 2015-07-25 DIAGNOSIS — R0602 Shortness of breath: Secondary | ICD-10-CM

## 2015-07-25 DIAGNOSIS — I2510 Atherosclerotic heart disease of native coronary artery without angina pectoris: Secondary | ICD-10-CM | POA: Diagnosis not present

## 2015-07-25 DIAGNOSIS — I2583 Coronary atherosclerosis due to lipid rich plaque: Secondary | ICD-10-CM

## 2015-07-25 DIAGNOSIS — Z5181 Encounter for therapeutic drug level monitoring: Secondary | ICD-10-CM

## 2015-07-25 NOTE — Progress Notes (Signed)
[address] [place], [place]  [id number] Phone: [phone]) [phone] Fax:  [phone]) [phone]  Date:  07/25/2015   ID:  Gabrielle [name], DOB [birth year], MRN [id number]  PCP:  Cain SaupeFULP, CAMMIE, MD   History of Present Illness: Gabrielle PalauVirginia W [name] is a 70 y.o. female  with coronary artery disease status post bypass surgery in 2010, paroxysmal atrial fibrillation on chronic anticoagulation, antiarrhythmics sotalol, EF 45-50%. COPD. Obesity.  She had been struggling with her daughter, alcohol but she has recently tried to take control over her life and no longer is housing her daughter, son.  Gabrielle [name].  Trying to avoid cigarettes.  3-4 times a day white Phlegm.  Overall just feeling more shortness of breath. She has had a 7 pound weight gain. She's noticing some swelling around her ankles previously. She also had a right excoriation on her lower leg which bled the other night. Overall she is just feeling worse. No chest pain, no syncope.   07/25/15 - Seeing Dr. Lowell GuitarPowell now. Methotrexate. Rash noted. Psoriasis. No chest pain, no significant shortness of breath. No energy. She does not exercise. She is here with her sister. Good weight loss since last visit.   Wt Readings from Last 3 Encounters:  07/25/15 188 lb 12.8 oz (85.639 kg)  04/12/15 207 lb (93.895 kg)  01/07/15 205 lb (92.987 kg)     Past Medical History  Diagnosis Date  . Chronic headache   . Hyperlipidemia   . Asthma   . Chronic bronchitis   . Heart attack (HCC)   . Arthritis   . Hypertension   . Depression   . COPD (chronic obstructive pulmonary disease) (HCC)   . Atrial fibrillation with controlled ventricular response (HCC) 11/2009  . Anemia   . Atrial fibrillation [facility](HCC)     Past Surgical History  Procedure Laterality Date  . Cesarean section      x2  . Total knee arthroplasty    . Coronary artery bypass graft  2011  . Cad-cabg      x7, brief post-op Atrial Fib  . Joint replacement Bilateral   .  Tonsillectomy      Current Outpatient Prescriptions  Medication Sig Dispense Refill  . albuterol (PROAIR HFA) 108 (90 BASE) MCG/ACT inhaler INHALE 2 PUFFS INTO THE LUNGS EVERY 6 (SIX) HOURS AS NEEDED. 18 g 3  . ALPRAZolam (XANAX) 0.5 MG tablet Take 0.5 mg by mouth 2 (two) times daily as needed.     Marland Kitchen. apixaban (ELIQUIS) 5 MG TABS tablet TAKE 1 TABLET (5 MG TOTAL) BY MOUTH 2 (TWO) TIMES DAILY. 180 tablet 0  . atorvastatin (LIPITOR) 20 MG tablet TAKE 1 TABLET (20 MG TOTAL) BY MOUTH DAILY. 90 tablet 1  . budesonide-formoterol (SYMBICORT) 160-4.5 MCG/ACT inhaler Inhale 2 puffs into the lungs 2 (two) times daily. 10.2 g 6  . citalopram (CELEXA) 20 MG tablet Take 1 tablet by mouth daily.    . clobetasol cream (TEMOVATE) 0.05 % Apply 1 application topically as needed.   3  . furosemide (LASIX) 20 MG tablet Take 1 tablet (20 mg total) by mouth daily. 30 tablet 6  . losartan (COZAAR) 50 MG tablet Take 1 tablet (50 mg total) by mouth daily. 90 tablet 0  . methotrexate (RHEUMATREX) 2.5 MG tablet Take 4 tablets by mouth Once a week.     . metoprolol succinate (TOPROL-XL) 25 MG 24 hr tablet TAKE 1 TABLET (25 MG TOTAL) BY MOUTH DAILY. 90 tablet  1  . oxymetazoline (AFRIN) 0.05 % nasal spray Place 2 sprays into the nose as needed for congestion.     . pantoprazole (PROTONIX) 40 MG tablet Take 40 mg by mouth daily.     . sotalol (BETAPACE) 80 MG tablet TAKE 1 TABLET (80 MG TOTAL) BY MOUTH 2 (TWO) TIMES DAILY. AND A SECOND TIME WHEN NEEDED. 225 tablet 0  . traMADol (ULTRAM) 50 MG tablet Take 50 mg by mouth every 6 (six) hours as needed for moderate pain.      No current facility-administered medications for this visit.    Allergies:    Allergies  Allergen Reactions  . Penicillins     REACTION: hives  . Procaine Hcl     REACTION: palpatations    Social History:  The patient  reports that she has been smoking Cigarettes.  She has been smoking about 0.20 packs per day. She has never used smokeless  tobacco. She reports that she does not drink alcohol or use illicit drugs.   Family History  Problem Relation Age of Onset  . Heart disease Father     before age 36  . Hypertension Father   . Heart attack Father   . Lung cancer Maternal Grandfather   . Lung cancer Paternal Grandfather   . Diabetes Mother   . Hypertension Mother   . Heart disease Mother     before age 63  . Hypertension Daughter   . Stroke Paternal Grandmother     ROS:  Please see the history of present illness.   Denies any bleeding, syncope, orthopnea, PND   All other systems reviewed and negative.   PHYSICAL EXAM: VS:  BP 130/68 mmHg  Pulse 60  Ht 5' 2.5" (1.588 m)  Wt 188 lb 12.8 oz (85.639 kg)  BMI 33.96 kg/m2 Well nourished, well developed, in no acute distress HEENT: normal, [place]/AT, EOMI Neck: no JVD, normal carotid upstroke, no bruit Cardiac:  normal S1, S2; RRR; no murmur Lungs:  clear to auscultation bilaterally, no wheezing, rhonchi or rales Abd: soft, nontender, no hepatomegaly, no bruits Ext: trace edema, 2+ distal pulses Skin: warm and dry, silvery scale plaque throughout several portions of her body. GU: deferred Neuro: no focal abnormalities noted, AAO x 3  EKG:  No EKG today 01/07/15-sinus bradycardia rate 54 with frequent PVCs, low amplitude P wave, poor R-wave progression, T-wave inversion in 1 and aVL. 07/04/14-sinus rhythm, 72, nonspecific ST-T wave changes -prior 02/07/14-sinus rhythm, 61, inferior infarct pattern, nonspecific ST-T wave changes. Poor R-wave progression.   Labs: 01/19/14-creatinine 0.85, potassium 4.3, TSH 0.67, glucose 139  ASSESSMENT AND PLAN:  1. Paroxysmal atrial fibrillation-apixaban. No bleeding. Continue with metoprolol. Sotalol. She does feel occasional palpitations. I do hear on auscultation occasional ectopy. Sounds like PVCs as was previously described on EKG. one time, she discontinued all of her medications and within a couple days she felt her heart racing. She  is now back on these medicines. 2. Chronic anticoagulation-as above. No major bleeding. Eliquis. 3. Chronic kidney disease stage III-she is seeing Dr. Lowell Guitar. She did have recent exacerbation of kidney function. On methotrexate for psoriasis. 4. Coronary artery disease-status post bypass surgery. Overall no anginal symptoms. Try to encourage her to take statin.  Lasix because of shortness of breath. 5. Obesity-encourage weight loss. She lost about 20 pounds since last visit. Doing better. 6. Depression-improved.  7. Hyperlipidemia-atorvastatin  8. Smoker - encourage continued cessation.   one month follow-up  Signed, Donato Schultz, MD [facility]  07/25/2015 9:58 AM

## 2015-07-25 NOTE — Patient Instructions (Signed)

## 2015-07-30 ENCOUNTER — Ambulatory Visit
Admission: RE | Admit: 2015-07-30 | Discharge: 2015-07-30 | Disposition: A | Discharge status: Home / Self Care | Payer: Medicare Other | Source: Ambulatory Visit | Attending: Nephrology | Admitting: Nephrology

## 2015-07-30 DIAGNOSIS — N183 Chronic kidney disease, stage 3 unspecified: Secondary | ICD-10-CM

## 2015-08-07 ENCOUNTER — Other Ambulatory Visit: Payer: Self-pay | Admitting: Nephrology

## 2015-08-07 DIAGNOSIS — N189 Chronic kidney disease, unspecified: Secondary | ICD-10-CM

## 2015-08-23 ENCOUNTER — Telehealth: Payer: Self-pay | Admitting: Cardiology

## 2015-08-23 NOTE — Telephone Encounter (Signed)
Per pt report she has had swelling in her legs with the right one being worse than the left.  She reports they are both swollen up to her knee and she can't see her ankles.  She has not noticed the edema resolving with taking increased lasix.  She was to have been taking 20 mg a day but has also been taking extra to where she can not get it refilled until 7/8 and she is out.  Advised to keep legs elevated during the day as much as possible, wear knee high compression hose daily, limit NA intake to less than 2,000 mg a day and fluid to no more than 2,000 cc/day.  She states understanding.  Aware I will forward to Dr Anne FuSkains to see if he would want her to increase Lasix, leave it at 20 mg a day or if she can take a prn dose.  Also if she needs any lab work.  Aware I will c/b once I have an answer and will call in RX as prescribed.

## 2015-08-23 NOTE — Telephone Encounter (Signed)
Called pt and explained to the pt that she would need to speak to Dr. Minerva FesterSkains's nurse about sending furosemide 20 mg tablet in for her because pt stated that she took extra pills and pt knows that she has refills at her pharmacy, but pt stated that the pharmacy will not refill until September 07, 2015. Please advise

## 2015-08-23 NOTE — Telephone Encounter (Signed)
Follow Up   [name] is stating that [employer] insurance company is saying that it is to early to fill the medication , and the pharmacist will not refill it without the us calling over to say that she is needing to have this refill . Her kidneys had shut down before she started taking the medication , but she is out and needs to have it refill . Please call if you have any questions .Marland Kitchen. Thanks

## 2015-08-23 NOTE — Telephone Encounter (Signed)
New message      *STAT* If patient is at the pharmacy, call can be transferred to refill team.   1. Which medications need to be refilled? (please list name of each medication and dose if known) Furosemide 20 mg po daily  2. Which pharmacy/location (including street and city if local pharmacy) is medication to be sent to?[facility] on [address]  3. Do they need a 30 day or 90 day supply? 30 day supple

## 2015-08-25 NOTE — Telephone Encounter (Signed)
Agree with plan. OK with her taking 20 lasix QD. She may take 40mg  if needed.   BMET please.   Donato SchultzMark Mattox Schorr, MD

## 2015-08-26 MED ORDER — FUROSEMIDE 20 MG PO TABS: 20.0000 mg | ORAL_TABLET | Freq: Every day | ORAL | Status: DC

## 2015-08-26 NOTE — Telephone Encounter (Signed)
Pt aware of instructions.  Reports she is going to see Dr Lowell GuitarPowell on 6/28 and will have them send us the results of her lab work.  Of note, the patient was confused about the date, day of the week and my name despite being told several times.  She reports she gets her "days mixed up" a lot. She will c/b with further concerns.

## 2015-08-26 NOTE — Telephone Encounter (Signed)
Left message for pt to c/b 

## 2015-09-15 ENCOUNTER — Other Ambulatory Visit: Payer: Self-pay | Admitting: Cardiology

## 2015-09-26 ENCOUNTER — Other Ambulatory Visit: Payer: Self-pay

## 2015-09-26 MED ORDER — FUROSEMIDE 20 MG PO TABS: 20.0000 mg | ORAL_TABLET | Freq: Every day | ORAL | 1 refills | Status: DC

## 2015-10-19 ENCOUNTER — Other Ambulatory Visit: Payer: Self-pay | Admitting: Cardiology

## 2015-10-21 ENCOUNTER — Encounter (HOSPITAL_COMMUNITY): Payer: Self-pay | Admitting: Emergency Medicine

## 2015-10-21 ENCOUNTER — Inpatient Hospital Stay (HOSPITAL_COMMUNITY)
Admission: EM | Admit: 2015-10-21 | Discharge: 2015-10-25 | DRG: 378 | Disposition: A | Discharge status: Skilled Nursing Facility | Payer: Medicare Other | Attending: Internal Medicine | Admitting: Internal Medicine

## 2015-10-21 DIAGNOSIS — R791 Abnormal coagulation profile: Secondary | ICD-10-CM | POA: Diagnosis present

## 2015-10-21 DIAGNOSIS — N183 Chronic kidney disease, stage 3 (moderate): Secondary | ICD-10-CM | POA: Diagnosis present

## 2015-10-21 DIAGNOSIS — K5731 Diverticulosis of large intestine without perforation or abscess with bleeding: Principal | ICD-10-CM | POA: Diagnosis present

## 2015-10-21 DIAGNOSIS — Z7901 Long term (current) use of anticoagulants: Secondary | ICD-10-CM | POA: Diagnosis not present

## 2015-10-21 DIAGNOSIS — N179 Acute kidney failure, unspecified: Secondary | ICD-10-CM | POA: Diagnosis present

## 2015-10-21 DIAGNOSIS — D6959 Other secondary thrombocytopenia: Secondary | ICD-10-CM | POA: Diagnosis present

## 2015-10-21 DIAGNOSIS — Z951 Presence of aortocoronary bypass graft: Secondary | ICD-10-CM

## 2015-10-21 DIAGNOSIS — F419 Anxiety disorder, unspecified: Secondary | ICD-10-CM | POA: Diagnosis present

## 2015-10-21 DIAGNOSIS — J449 Chronic obstructive pulmonary disease, unspecified: Secondary | ICD-10-CM | POA: Diagnosis present

## 2015-10-21 DIAGNOSIS — E876 Hypokalemia: Secondary | ICD-10-CM | POA: Diagnosis present

## 2015-10-21 DIAGNOSIS — G8929 Other chronic pain: Secondary | ICD-10-CM | POA: Diagnosis present

## 2015-10-21 DIAGNOSIS — K922 Gastrointestinal hemorrhage, unspecified: Secondary | ICD-10-CM | POA: Diagnosis not present

## 2015-10-21 DIAGNOSIS — K648 Other hemorrhoids: Secondary | ICD-10-CM | POA: Diagnosis present

## 2015-10-21 DIAGNOSIS — M549 Dorsalgia, unspecified: Secondary | ICD-10-CM | POA: Diagnosis present

## 2015-10-21 DIAGNOSIS — Z8249 Family history of ischemic heart disease and other diseases of the circulatory system: Secondary | ICD-10-CM

## 2015-10-21 DIAGNOSIS — I130 Hypertensive heart and chronic kidney disease with heart failure and stage 1 through stage 4 chronic kidney disease, or unspecified chronic kidney disease: Secondary | ICD-10-CM | POA: Diagnosis present

## 2015-10-21 DIAGNOSIS — R296 Repeated falls: Secondary | ICD-10-CM | POA: Diagnosis present

## 2015-10-21 DIAGNOSIS — K219 Gastro-esophageal reflux disease without esophagitis: Secondary | ICD-10-CM | POA: Diagnosis present

## 2015-10-21 DIAGNOSIS — D649 Anemia, unspecified: Secondary | ICD-10-CM | POA: Diagnosis not present

## 2015-10-21 DIAGNOSIS — I252 Old myocardial infarction: Secondary | ICD-10-CM

## 2015-10-21 DIAGNOSIS — Z96653 Presence of artificial knee joint, bilateral: Secondary | ICD-10-CM | POA: Diagnosis present

## 2015-10-21 DIAGNOSIS — Z79899 Other long term (current) drug therapy: Secondary | ICD-10-CM

## 2015-10-21 DIAGNOSIS — I10 Essential (primary) hypertension: Secondary | ICD-10-CM | POA: Diagnosis not present

## 2015-10-21 DIAGNOSIS — I110 Hypertensive heart disease with heart failure: Secondary | ICD-10-CM | POA: Diagnosis present

## 2015-10-21 DIAGNOSIS — E785 Hyperlipidemia, unspecified: Secondary | ICD-10-CM | POA: Diagnosis present

## 2015-10-21 DIAGNOSIS — D62 Acute posthemorrhagic anemia: Secondary | ICD-10-CM | POA: Diagnosis present

## 2015-10-21 DIAGNOSIS — I5022 Chronic systolic (congestive) heart failure: Secondary | ICD-10-CM | POA: Diagnosis present

## 2015-10-21 DIAGNOSIS — D689 Coagulation defect, unspecified: Secondary | ICD-10-CM

## 2015-10-21 DIAGNOSIS — I480 Paroxysmal atrial fibrillation: Secondary | ICD-10-CM | POA: Diagnosis present

## 2015-10-21 DIAGNOSIS — Z880 Allergy status to penicillin: Secondary | ICD-10-CM

## 2015-10-21 DIAGNOSIS — Z86010 Personal history of colonic polyps: Secondary | ICD-10-CM

## 2015-10-21 DIAGNOSIS — Z833 Family history of diabetes mellitus: Secondary | ICD-10-CM

## 2015-10-21 DIAGNOSIS — Z22322 Carrier or suspected carrier of Methicillin resistant Staphylococcus aureus: Secondary | ICD-10-CM

## 2015-10-21 DIAGNOSIS — I2510 Atherosclerotic heart disease of native coronary artery without angina pectoris: Secondary | ICD-10-CM | POA: Diagnosis present

## 2015-10-21 DIAGNOSIS — F329 Major depressive disorder, single episode, unspecified: Secondary | ICD-10-CM | POA: Diagnosis present

## 2015-10-21 DIAGNOSIS — Z87891 Personal history of nicotine dependence: Secondary | ICD-10-CM

## 2015-10-21 DIAGNOSIS — Z888 Allergy status to other drugs, medicaments and biological substances status: Secondary | ICD-10-CM

## 2015-10-21 DIAGNOSIS — I5020 Unspecified systolic (congestive) heart failure: Secondary | ICD-10-CM | POA: Diagnosis present

## 2015-10-21 LAB — URINE MICROSCOPIC-ADD ON

## 2015-10-21 LAB — COMPREHENSIVE METABOLIC PANEL
ALT: 8 U/L — ABNORMAL LOW (ref 14–54)
AST: 31 U/L (ref 15–41)
Albumin: 2.8 g/dL — ABNORMAL LOW (ref 3.5–5.0)
Alkaline Phosphatase: 47 U/L (ref 38–126)
Anion gap: 10 (ref 5–15)
BUN: 15 mg/dL (ref 6–20)
CO2: 23 mmol/L (ref 22–32)
Calcium: 8.1 mg/dL — ABNORMAL LOW (ref 8.9–10.3)
Chloride: 107 mmol/L (ref 101–111)
Creatinine, Ser: 1.85 mg/dL — ABNORMAL HIGH (ref 0.44–1.00)
GFR calc Af Amer: 30 mL/min — ABNORMAL LOW (ref >60)
GFR calc non Af Amer: 26 mL/min — ABNORMAL LOW (ref >60)
Glucose, Bld: 105 mg/dL — ABNORMAL HIGH (ref 65–99)
Potassium: 3.5 mmol/L (ref 3.5–5.1)
Sodium: 140 mmol/L (ref 135–145)
Total Bilirubin: 1.9 mg/dL — ABNORMAL HIGH (ref 0.3–1.2)
Total Protein: 4.5 g/dL — ABNORMAL LOW (ref 6.5–8.1)

## 2015-10-21 LAB — URINALYSIS, ROUTINE W REFLEX MICROSCOPIC
Glucose, UA: NEGATIVE mg/dL
Ketones, ur: NEGATIVE mg/dL
Nitrite: NEGATIVE
Protein, ur: 30 mg/dL — AB
Specific Gravity, Urine: 1.018 (ref 1.005–1.030)
pH: 5 (ref 5.0–8.0)

## 2015-10-21 LAB — PREPARE RBC (CROSSMATCH)

## 2015-10-21 LAB — TROPONIN I: Troponin I: 0.04 ng/mL (ref <0.03)

## 2015-10-21 LAB — PROTIME-INR
INR: 2.72
Prothrombin Time: 29.4 seconds — ABNORMAL HIGH (ref 11.4–15.2)

## 2015-10-21 LAB — MRSA PCR SCREENING: MRSA by PCR: POSITIVE — AB

## 2015-10-21 LAB — POC OCCULT BLOOD, ED: Fecal Occult Bld: POSITIVE — AB

## 2015-10-21 LAB — HEMOGLOBIN: Hemoglobin: 6 g/dL — CL (ref 12.0–15.0)

## 2015-10-21 LAB — CBC
HCT: 14 % — ABNORMAL LOW (ref 36.0–46.0)
Hemoglobin: 4 g/dL — CL (ref 12.0–15.0)
MCH: 25.8 pg — ABNORMAL LOW (ref 26.0–34.0)
MCHC: 28.6 g/dL — ABNORMAL LOW (ref 30.0–36.0)
MCV: 90.3 fL (ref 78.0–100.0)
Platelets: 130 10*3/uL — ABNORMAL LOW (ref 150–400)
RBC: 1.55 MIL/uL — ABNORMAL LOW (ref 3.87–5.11)
RDW: 19.4 % — ABNORMAL HIGH (ref 11.5–15.5)
WBC: 6.5 10*3/uL (ref 4.0–10.5)

## 2015-10-21 LAB — HEMATOCRIT: HCT: 19.7 % — ABNORMAL LOW (ref 36.0–46.0)

## 2015-10-21 MED ORDER — SODIUM CHLORIDE 0.9% FLUSH
3.0000 mL | Freq: Two times a day (BID) | INTRAVENOUS | Status: DC
  Administered 2015-10-21 – 2015-10-25 (×3): 3 mL via INTRAVENOUS

## 2015-10-21 MED ORDER — FUROSEMIDE 10 MG/ML IJ SOLN
20.0000 mg | Freq: Once | INTRAMUSCULAR | Status: AC
  Administered 2015-10-21: 20 mg via INTRAVENOUS
  Filled 2015-10-21: qty 2

## 2015-10-21 MED ORDER — ONDANSETRON HCL 4 MG/2ML IJ SOLN: 4.0000 mg | Freq: Four times a day (QID) | INTRAMUSCULAR | Status: DC | PRN

## 2015-10-21 MED ORDER — SODIUM CHLORIDE 0.9 % IV SOLN
80.0000 mg | Freq: Once | INTRAVENOUS | Status: DC
  Filled 2015-10-21: qty 80

## 2015-10-21 MED ORDER — SODIUM CHLORIDE 0.9 % IV BOLUS (SEPSIS)
500.0000 mL | Freq: Once | INTRAVENOUS | Status: AC
  Administered 2015-10-21: 500 mL via INTRAVENOUS

## 2015-10-21 MED ORDER — SODIUM CHLORIDE 0.9 % IV SOLN
80.0000 mg | Freq: Once | INTRAVENOUS | Status: AC
  Administered 2015-10-21: 80 mg via INTRAVENOUS
  Filled 2015-10-21: qty 80

## 2015-10-21 MED ORDER — ONDANSETRON HCL 4 MG PO TABS: 4.0000 mg | ORAL_TABLET | Freq: Four times a day (QID) | ORAL | Status: DC | PRN

## 2015-10-21 MED ORDER — SOTALOL HCL 80 MG PO TABS
80.0000 mg | ORAL_TABLET | Freq: Two times a day (BID) | ORAL | Status: DC
  Administered 2015-10-21 – 2015-10-25 (×6): 80 mg via ORAL
  Filled 2015-10-21 (×9): qty 1

## 2015-10-21 MED ORDER — SODIUM CHLORIDE 0.9 % IV SOLN
Freq: Once | INTRAVENOUS | Status: AC
  Administered 2015-10-21: 23:00:00 via INTRAVENOUS

## 2015-10-21 MED ORDER — OXYCODONE HCL 5 MG PO TABS
5.0000 mg | ORAL_TABLET | ORAL | Status: DC | PRN
  Administered 2015-10-21 – 2015-10-23 (×5): 5 mg via ORAL
  Filled 2015-10-21 (×5): qty 1

## 2015-10-21 MED ORDER — SODIUM CHLORIDE 0.9 % IV SOLN
Freq: Once | INTRAVENOUS | Status: AC
  Administered 2015-10-21: 16:00:00 via INTRAVENOUS

## 2015-10-21 MED ORDER — SODIUM CHLORIDE 0.9% FLUSH: 3.0000 mL | INTRAVENOUS | Status: DC | PRN

## 2015-10-21 MED ORDER — SODIUM CHLORIDE 0.9% FLUSH
3.0000 mL | Freq: Two times a day (BID) | INTRAVENOUS | Status: DC
  Administered 2015-10-21 – 2015-10-25 (×7): 3 mL via INTRAVENOUS

## 2015-10-21 MED ORDER — ACETAMINOPHEN 325 MG PO TABS
650.0000 mg | ORAL_TABLET | Freq: Four times a day (QID) | ORAL | Status: DC | PRN
Start: 2015-10-21 — End: 2015-10-25

## 2015-10-21 MED ORDER — SODIUM CHLORIDE 0.9 % IV SOLN: 250.0000 mL | INTRAVENOUS | Status: DC | PRN

## 2015-10-21 MED ORDER — ACETAMINOPHEN 650 MG RE SUPP: 650.0000 mg | Freq: Four times a day (QID) | RECTAL | Status: DC | PRN

## 2015-10-21 MED ORDER — SODIUM CHLORIDE 0.9 % IV SOLN
8.0000 mg/h | INTRAVENOUS | Status: DC
  Administered 2015-10-22 (×2): 8 mg/h via INTRAVENOUS
  Filled 2015-10-21 (×6): qty 80

## 2015-10-21 NOTE — Consult Note (Signed)
[facility] CONSULT Reason for consult: G.I. bleed Referring Physician: emergency room. PCP: Dr. Chapman Fitch. Primary G.I.: Dr. Edythe Clarity Gabrielle [name] is an 70 y.o. female.  HPI: she has a history of atrial fibrillation is on Eliquis. She's had progressive weakness for the past several days but no gross signs of bleeding. She is on Eliquis BID took some aspirin began to notice bleeding felt terrible due to severe fatigue came to the emergency room, found to have hemoglobin 4.0. Had dried blood on rectal exam. Patient denies hematochezia or melena. She has had previous colonoscopy by Dr. Watt Climes for follow-up polyps. Last was done in 2010 along with EGD 4 polyp follow-up as well as for diarrhea. Biopsies of the: were negative and biopsies of the small bowel were negative for celiac disease. Repeat colonoscopy was recommended in 2015 but the patient decline. She has chronic problems with itching from psoriasis hypertension chronic swelling of her ankles. She's been on methotrexate for psoriasis. She has chronic back pain but denies taking any NSAIDs. She has COPD followed by Dr. Joya Gaskins. She has chronic constipation. She has back pain and takes Robaxin for back spasms. She does take tramadol as needed for back pain but adamantly denies any NSAIDs.  Past Medical History:  Diagnosis Date  . Anemia   . Arthritis   . Asthma   . Atrial fibrillation ([place])   . Atrial fibrillation with controlled ventricular response ([place]) 11/2009  . Chronic bronchitis   . Chronic headache   . COPD (chronic obstructive pulmonary disease) ([place])   . Depression   . Heart attack ([place])   . Hyperlipidemia   . Hypertension     Past Surgical History:  Procedure Laterality Date  . CAD-CABG     x7, brief post-op Atrial Fib  . CESAREAN SECTION     x2  . CORONARY ARTERY BYPASS GRAFT  2011  . JOINT REPLACEMENT Bilateral   . TONSILLECTOMY    . TOTAL KNEE ARTHROPLASTY      Family History  Problem Relation Age of Onset   . Diabetes Mother   . Hypertension Mother   . Heart disease Mother     before age 93  . Heart disease Father     before age 62  . Hypertension Father   . Heart attack Father   . Lung cancer Maternal Grandfather   . Lung cancer Paternal Grandfather   . Hypertension Daughter   . Stroke Paternal Grandmother     Social History:  reports that she has quit smoking. Her smoking use included Cigarettes. She smoked 0.20 packs per day. She has never used smokeless tobacco. She reports that she does not drink alcohol or use drugs.  Allergies:  Allergies  Allergen Reactions  . Penicillins Hives  . Procaine Hcl Palpitations    Medications; Prior to Admission medications   Medication Sig Start Date End Date Taking? Authorizing Provider  albuterol (PROAIR HFA) 108 (90 BASE) MCG/ACT inhaler INHALE 2 PUFFS INTO THE LUNGS EVERY 6 (SIX) HOURS AS NEEDED. 01/15/14  Yes Elsie Stain, MD  ALPRAZolam Duanne Moron) 0.5 MG tablet Take 0.5 mg by mouth 2 (two) times daily as needed for anxiety.    Yes Historical Provider, MD  apixaban (ELIQUIS) 5 MG TABS tablet TAKE 1 TABLET (5 MG TOTAL) BY MOUTH 2 (TWO) TIMES DAILY. 10/16/14  Yes Jerline Pain, MD  atorvastatin (LIPITOR) 20 MG tablet TAKE 1 TABLET (20 MG TOTAL) BY MOUTH DAILY. 10/16/14  Yes Jerline Pain, MD  budesonide-formoterol (SYMBICORT) 160-4.5 MCG/ACT inhaler Inhale 2 puffs into the lungs 2 (two) times daily. Patient taking differently: Inhale 2 puffs into the lungs 2 (two) times daily as needed (for shortness of breath).  01/15/14  Yes Elsie Stain, MD  citalopram (CELEXA) 20 MG tablet Take 20 mg by mouth 2 (two) times daily.    Yes Historical Provider, MD  furosemide (LASIX) 20 MG tablet Take 1 tablet (20 mg total) by mouth daily. May take additional 20 mg a day for swelling as needed 09/26/15  Yes Jerline Pain, MD  losartan (COZAAR) 50 MG tablet Take 1 tablet (50 mg total) by mouth daily. 07/24/15  Yes Jerline Pain, MD  metoprolol succinate  (TOPROL-XL) 25 MG 24 hr tablet TAKE 1 TABLET BY MOUTH EVERY DAY Patient taking differently: Take 25 mg by mouth twice daily 10/21/15  Yes Jerline Pain, MD  oxymetazoline (AFRIN) 0.05 % nasal spray Place 2 sprays into the nose as needed for congestion.    Yes Historical Provider, MD  pantoprazole (PROTONIX) 40 MG tablet Take 40 mg by mouth daily.  10/10/14  Yes Historical Provider, MD  Polyethyl Glycol-Propyl Glycol (SYSTANE ULTRA) 0.4-0.3 % SOLN Place 1 drop into both eyes as needed (for dry eyes).   Yes Historical Provider, MD  sotalol (BETAPACE) 80 MG tablet TAKE 1 TABLET BY MOUTH 2 TIMES DAILY. AND A SECOND TIME WHEN NEEDED. Patient taking differently: TAKE 80 MG BY MOUTH 2 TIMES DAILY. AND A SECOND TIME WHEN NEEDED. 09/16/15  Yes Jerline Pain, MD  tiZANidine (ZANAFLEX) 4 MG tablet Take 4 mg by mouth at bedtime as needed for muscle spasms.   Yes Historical Provider, MD  traMADol (ULTRAM) 50 MG tablet Take 50 mg by mouth every 6 (six) hours as needed for moderate pain.    Yes Historical Provider, MD  clobetasol cream (TEMOVATE) 3.53 % Apply 1 application topically as needed (for skin).     Historical Provider, MD     PRN Meds  Results for orders placed or performed during the hospital encounter of 10/21/15 (from the past 48 hour(s))  CBC     Status: Abnormal   Collection Time: 10/21/15  2:47 PM  Result Value Ref Range   WBC 6.5 4.0 - 10.5 K/uL   RBC 1.55 (L) 3.87 - 5.11 MIL/uL   Hemoglobin 4.0 (LL) 12.0 - 15.0 g/dL    Comment: REPEATED TO VERIFY CRITICAL RESULT CALLED TO, READ BACK BY AND VERIFIED WITH: K COLLINS,RN 1515 10/21/15 D BRADLEY    HCT 14.0 (L) 36.0 - 46.0 %   MCV 90.3 78.0 - 100.0 fL   MCH 25.8 (L) 26.0 - 34.0 pg   MCHC 28.6 (L) 30.0 - 36.0 g/dL   RDW 19.4 (H) 11.5 - 15.5 %   Platelets 130 (L) 150 - 400 K/uL  Comprehensive metabolic panel     Status: Abnormal   Collection Time: 10/21/15  2:47 PM  Result Value Ref Range   Sodium 140 135 - 145 mmol/L   Potassium 3.5 3.5 -  5.1 mmol/L   Chloride 107 101 - 111 mmol/L   CO2 23 22 - 32 mmol/L   Glucose, Bld 105 (H) 65 - 99 mg/dL   BUN 15 6 - 20 mg/dL   Creatinine, Ser 1.85 (H) 0.44 - 1.00 mg/dL   Calcium 8.1 (L) 8.9 - 10.3 mg/dL   Total Protein 4.5 (L) 6.5 - 8.1 g/dL   Albumin 2.8 (L) 3.5 - 5.0 g/dL  AST 31 15 - 41 U/L   ALT 8 (L) 14 - 54 U/L   Alkaline Phosphatase 47 38 - 126 U/L   Total Bilirubin 1.9 (H) 0.3 - 1.2 mg/dL   GFR calc non Af Amer 26 (L) >60 mL/min   GFR calc Af Amer 30 (L) >60 mL/min    Comment: (NOTE) The eGFR has been calculated using the CKD EPI equation. This calculation has not been validated in all clinical situations. eGFR's persistently <60 mL/min signify possible Chronic Kidney Disease.    Anion gap 10 5 - 15  Protime-INR     Status: Abnormal   Collection Time: 10/21/15  2:47 PM  Result Value Ref Range   Prothrombin Time 29.4 (H) 11.4 - 15.2 seconds   INR 2.72   Type and screen     Status: None (Preliminary result)   Collection Time: 10/21/15  2:47 PM  Result Value Ref Range   ABO/RH(D) O POS    Antibody Screen POS    Sample Expiration 10/24/2015    Antibody Identification PENDING    PT AG Type PENDING    DAT, IgG NEG    Unit Number [id number]    Blood Component Type RED CELLS,LR    Unit division 00    Status of Unit ISSUED    Unit tag comment VERBAL ORDERS PER DR STEINL    Transfusion Status OK TO TRANSFUSE    Crossmatch Result PENDING   Troponin I     Status: Abnormal   Collection Time: 10/21/15  2:47 PM  Result Value Ref Range   Troponin I 0.04 (HH) <0.03 ng/mL    Comment: CRITICAL RESULT CALLED TO, READ BACK BY AND VERIFIED WITHReynold Bowen RN [phone] GREEN R   POC occult blood, ED Provider will collect     Status: Abnormal   Collection Time: 10/21/15  4:09 PM  Result Value Ref Range   Fecal Occult Bld POSITIVE (A) NEGATIVE    No results found. ROS: As above markedly positive            Blood pressure 101/69, pulse 69, temperature  98.1 F (36.7 C), temperature source Oral, resp. rate 17, SpO2 99 %.  Physical exam:   General-- pleasant white female no acute distress ENT-- nonicteric Neck-- no lymphadenopathy Heart-- regular rate and rhythm without murmurs are gallops Lungs-- clear Abdomen-- soft and nontender Psych-- talkative with rambling speech. Appears to be alert and oriented   Assessment: 1. Profound anemia. Patient is not appear to be having any roast bleeding does have some positive stool. I think she'll need another endoscopic evaluation. 2. Chronic back pain 3. CAD status post CABG 4. Paroxysmal afib fibrillation chronically anticoagulated followed by Dr. Luther Parody 5. History of colon polyps  Plan: 1. Would continue the patient on Protonix. She apparently takes is for reflux states that she takes chronically. She needs to be resuscitated with blood. 2. Would keep her own clear liquids and we will check her hemoglobin in the morning. If she is improved we can see about getting her own [facility] EGD tomorrow. 3. Would keep her on a clear liquid diet tonight NPO after midnight. 4 she will likely need colonoscopy this admission but would prefer to wait till she has been adequately transfuse.    Shadaya Marschner JR,Kasch Borquez L 10/21/2015, 4:18 PM   This note was created using voice recognition software and minor errors may Have occurred unintentionally. Pager: [phone] If no answer or after hours call [phone]

## 2015-10-21 NOTE — ED Notes (Signed)
Attempted report x1. 

## 2015-10-21 NOTE — ED Provider Notes (Signed)
[facility]ENC[facility] DEPT Provider Note   CSN: [id number] Arrival date & time: 10/21/15  1419  History   Chief Complaint Chief Complaint  Patient presents with  . Chest Pain  . Nausea  . Dizziness  . Diarrhea  . Palpitations  . Weakness    HPI Gabrielle [name] is a 70 y.o. female.  HPI   Patient presents with dizziness and weakness.   Patient reports worsening dizziness and weakness for the past four days. She wanted to come to the ED sooner, but was waiting for her friend to come home from vacation to bring her to the ED. Also reports nausea without vomiting, palpitations, and sharp chest pain. Has a history of a.fib but says she has not had palpitations in a while. Feels as if her heart is going to beat out of her chest. She is on Eliquis BID, but took an extra on Saturday when she started having chest pain, as well as three aspirin. She subsequently began to notice bleeding from abrasions on her arm as well as her L ear. She denies any hematuria or hematochezia. Bleeding has since stopped.   Past Medical History:  Diagnosis Date  . Anemia   . Arthritis   . Asthma   . Atrial fibrillation ([facility])   . Atrial fibrillation with controlled ventricular response ([facility]) 11/2009  . Chronic bronchitis   . Chronic headache   . COPD (chronic obstructive pulmonary disease) ([facility])   . Depression   . Heart attack ([facility])   . Hyperlipidemia   . Hypertension     Patient Active Problem List   Diagnosis Date Noted  . Coronary artery disease due to lipid rich plaque 02/07/2014  . Obesity 02/07/2014  . Abdominal aortic ectasia ([facility]) 09/06/2013  . Encounter for therapeutic drug monitoring 07/13/2013  . Atrial fibrillation ([facility]) 12/20/2012  . Atrial fibrillation with controlled ventricular response ([facility]) 11/30/2009  . NICOTINE ADDICTION 04/02/2009  . PARAPSORIASIS 12/15/2008  . Hyperlipidemia 12/14/2008  . HEART ATTACK 12/14/2008  . HEADACHE, CHRONIC 12/14/2008  . DEPRESSION 12/13/2008  .  Essential hypertension 12/13/2008  . C O P D  Gold C  12/13/2008  . ARTHRITIS 12/13/2008    Past Surgical History:  Procedure Laterality Date  . CAD-CABG     x7, brief post-op Atrial Fib  . CESAREAN SECTION     x2  . CORONARY ARTERY BYPASS GRAFT  2011  . JOINT REPLACEMENT Bilateral   . TONSILLECTOMY    . TOTAL KNEE ARTHROPLASTY      OB History    No data available       Home Medications    Prior to Admission medications   Medication Sig Start Date End Date Taking? Authorizing Provider  albuterol (PROAIR HFA) 108 (90 BASE) MCG/ACT inhaler INHALE 2 PUFFS INTO THE LUNGS EVERY 6 (SIX) HOURS AS NEEDED. 01/15/14  Yes Storm Frisk, MD  ALPRAZolam Prudy Feeler) 0.5 MG tablet Take 0.5 mg by mouth 2 (two) times daily as needed for anxiety.    Yes Historical Provider, MD  apixaban (ELIQUIS) 5 MG TABS tablet TAKE 1 TABLET (5 MG TOTAL) BY MOUTH 2 (TWO) TIMES DAILY. 10/16/14  Yes Jake Bathe, MD  atorvastatin (LIPITOR) 20 MG tablet TAKE 1 TABLET (20 MG TOTAL) BY MOUTH DAILY. 10/16/14  Yes Jake Bathe, MD  budesonide-formoterol [facility]) 160-4.5 MCG/ACT inhaler Inhale 2 puffs into the lungs 2 (two) times daily. Patient taking differently: Inhale 2 puffs into the lungs 2 (two) times daily as needed (for  shortness of breath).  01/15/14  Yes Storm FriskPatrick E Wright, MD  citalopram (CELEXA) 20 MG tablet Take 20 mg by mouth 2 (two) times daily.    Yes Historical Provider, MD  furosemide (LASIX) 20 MG tablet Take 1 tablet (20 mg total) by mouth daily. May take additional 20 mg a day for swelling as needed 09/26/15  Yes Jake BatheMark C Skains, MD  losartan (COZAAR) 50 MG tablet Take 1 tablet (50 mg total) by mouth daily. 07/24/15  Yes Jake BatheMark C Skains, MD  metoprolol succinate (TOPROL-XL) 25 MG 24 hr tablet TAKE 1 TABLET BY MOUTH EVERY DAY Patient taking differently: Take 25 mg by mouth twice daily 10/21/15  Yes Jake BatheMark C Skains, MD  oxymetazoline (AFRIN) 0.05 % nasal spray Place 2 sprays into the nose as needed for  congestion.    Yes Historical Provider, MD  pantoprazole (PROTONIX) 40 MG tablet Take 40 mg by mouth daily.  10/10/14  Yes Historical Provider, MD  Polyethyl Glycol-Propyl Glycol (SYSTANE ULTRA) 0.4-0.3 % SOLN Place 1 drop into both eyes as needed (for dry eyes).   Yes Historical Provider, MD  sotalol (BETAPACE) 80 MG tablet TAKE 1 TABLET BY MOUTH 2 TIMES DAILY. AND A SECOND TIME WHEN NEEDED. Patient taking differently: TAKE 80 MG BY MOUTH 2 TIMES DAILY. AND A SECOND TIME WHEN NEEDED. 09/16/15  Yes Jake BatheMark C Skains, MD  tiZANidine (ZANAFLEX) 4 MG tablet Take 4 mg by mouth at bedtime as needed for muscle spasms.   Yes Historical Provider, MD  traMADol (ULTRAM) 50 MG tablet Take 50 mg by mouth every 6 (six) hours as needed for moderate pain.    Yes Historical Provider, MD  clobetasol cream (TEMOVATE) 0.05 % Apply 1 application topically as needed (for skin).     Historical Provider, MD    Family History Family History  Problem Relation Age of Onset  . Diabetes Mother   . Hypertension Mother   . Heart disease Mother     before age 460  . Heart disease Father     before age 72  . Hypertension Father   . Heart attack Father   . Lung cancer Maternal Grandfather   . Lung cancer Paternal Grandfather   . Hypertension Daughter   . Stroke Paternal Grandmother     Social History Social History  Substance Use Topics  . Smoking status: Former Smoker    Packs/day: 0.20    Types: Cigarettes  . Smokeless tobacco: Never Used  . Alcohol use No   Allergies   Penicillins and Procaine hcl  Review of Systems Review of Systems  Constitutional: Positive for activity change, appetite change and fatigue.  Respiratory: Positive for shortness of breath.   Cardiovascular: Positive for chest pain and palpitations.  Gastrointestinal: Positive for abdominal pain and nausea. Negative for blood in stool, constipation, diarrhea and vomiting.  Neurological: Positive for dizziness and weakness.  Hematological:  Bruises/bleeds easily.  Psychiatric/Behavioral: Positive for confusion and decreased concentration.   Physical Exam Updated Vital Signs BP (!) 104/53   Pulse 62   Temp 98.7 F (37.1 C) (Oral)   Resp 16   SpO2 100%   Physical Exam  Constitutional:  Pale female speaking slowly, sitting up in bed in NAD  HENT:  Head: Normocephalic and atraumatic.  Dry mucous membranes  Eyes: Conjunctivae and EOM are normal. Pupils are equal, round, and reactive to light. Right eye exhibits no discharge. Left eye exhibits no discharge.  Cardiovascular: Normal rate, regular rhythm and normal heart sounds.  No murmur heard. Pulmonary/Chest: Effort normal. No respiratory distress. She has no wheezes.  Some crackles at bases, R>L  Abdominal: Soft. Bowel sounds are normal. She exhibits no distension. There is no tenderness. There is no guarding.  Neurological:  Slow speech, forgetting topic in middle of sentence multiple times throughout encounter; A&Ox3  Skin: There is pallor.  Nursing note and vitals reviewed.  ED Treatments / Results  Labs (all labs ordered are listed, but only abnormal results are displayed) Labs Reviewed  CBC - Abnormal; Notable for the following:       Result Value   RBC 1.55 (*)    Hemoglobin 4.0 (*)    HCT 14.0 (*)    MCH 25.8 (*)    MCHC 28.6 (*)    RDW 19.4 (*)    Platelets 130 (*)    All other components within normal limits  COMPREHENSIVE METABOLIC PANEL - Abnormal; Notable for the following:    Glucose, Bld 105 (*)    Creatinine, Ser 1.85 (*)    Calcium 8.1 (*)    Total Protein 4.5 (*)    Albumin 2.8 (*)    ALT 8 (*)    Total Bilirubin 1.9 (*)    GFR calc non Af Amer 26 (*)    GFR calc Af Amer 30 (*)    All other components within normal limits  PROTIME-INR - Abnormal; Notable for the following:    Prothrombin Time 29.4 (*)    All other components within normal limits  TROPONIN I - Abnormal; Notable for the following:    Troponin I 0.04 (*)    All other  components within normal limits  POC OCCULT BLOOD, ED - Abnormal; Notable for the following:    Fecal Occult Bld POSITIVE (*)    All other components within normal limits  URINALYSIS, ROUTINE W REFLEX MICROSCOPIC (NOT AT [facility])  VITAMIN B12  FOLATE  IRON AND TIBC  FERRITIN  RETICULOCYTES  TYPE AND SCREEN  PREPARE RBC (CROSSMATCH)    EKG  EKG Interpretation  Date/Time:  Monday October 21 2015 14:37:46 EDT Ventricular Rate:  63 PR Interval:    QRS Duration: 116 QT Interval:  503 QTC Calculation: 515 R Axis:   -19 Text Interpretation:  Atrial fibrillation Nonspecific intraventricular conduction delay Nonspecific T abnormalities, lateral leads Low voltage QRS Confirmed by Denton Lank  MD, Caryn Bee ([id number]) on 10/21/2015 3:21:09 PM      Radiology No results found.  Procedures Procedures (including critical care time)  Medications Ordered in ED Medications  pantoprazole (PROTONIX) 80 mg in sodium chloride 0.9 % 100 mL IVPB (80 mg Intravenous New Bag/Given 10/21/15 1615)  sodium chloride 0.9 % bolus 500 mL (0 mLs Intravenous Stopped 10/21/15 1619)  0.9 %  sodium chloride infusion ( Intravenous New Bag/Given 10/21/15 1617)   Initial Impression / Assessment and Plan / ED Course  I have reviewed the triage vital signs and the nursing notes.  Pertinent labs & imaging results that were available during my care of the patient were reviewed by me and considered in my medical decision making (see chart for details).  Clinical Course   1515 Hgb 4. Patient with dried blood noted on rectal exam performed by Dr. Denton Lank. 2U PRBC ordered. 80mg  IV Protonix ordered. [facility] consulted.   Final Clinical Impressions(s) / ED Diagnoses   Final diagnoses:  Acute GI bleeding  Severe anemia  Symptomatic anemia  Coagulopathy (HCC)  AKI (acute kidney injury) [facility])    Patient presenting with  weakness, dizziness, chest pain and palpitations. Found to have Hgb 4. Sx likely 2/2 active GI bleed. [facility]  consulted and will see. [facility] to admit.   New Prescriptions New Prescriptions   No medications on file     Marquette SaaAbigail Joseph Shana Zavaleta, MD 10/21/15 1630    Marquette SaaAbigail Joseph Shirin Echeverry, MD 10/21/15 1631    Cathren LaineKevin Steinl, MD 10/24/15 1000

## 2015-10-21 NOTE — H&P (Addendum)
History and Physical    Gabrielle PalauVirginia W [name] UJW:[id number] DOB: 29-Jan-1946 DOA: 10/21/2015  PCP: Cain SaupeFULP, CAMMIE, MD  Patient coming from: Home  Chief Complaint: Weakness  HPI: Gabrielle [name] is a 70 y.o. female with medical history significant of atrial fibrillation currently anticoagulated with Eliquis, also having history of coronary artery disease status post bypass surgery in 2010, systolic congestive heart failure, chronic obstructive pulmonary disease, presenting to the emergency department with complaints of profound weakness. She states that over the past 2-3 weeks she has had severe weakness limiting her ability to perform activities of daily living at home, poor tolerance to physical exertion, recurrent falls, shortness of breath and chest pain on exertion. She is not sure if she has had melena or bright red blood per rectum. She also reports vague epigastric abdominal pain. She denies hematemesis. She denies fevers, chills, nausea, vomiting, shortness of breath. She denies NSAIDs or alcohol abuse.   ED Course: Lab work in the emergency department showing a hemoglobin of 4.0. She was transfused 1 unit of packed red blood cells. Emergency room provider noting bright red blood on rectal exam. Dr. Randa EvensEdwards of GI was consulted who evaluated patient in the emergency department.  Review of Systems: As per HPI otherwise 10 point review of systems negative.    Past Medical History:  Diagnosis Date  . Anemia   . Arthritis   . Asthma   . Atrial fibrillation (HCC)   . Atrial fibrillation with controlled ventricular response (HCC) 11/2009  . Chronic bronchitis   . Chronic headache   . COPD (chronic obstructive pulmonary disease) (HCC)   . Depression   . Heart attack (HCC)   . Hyperlipidemia   . Hypertension     Past Surgical History:  Procedure Laterality Date  . CAD-CABG     x7, brief post-op Atrial Fib  . CESAREAN SECTION     x2  . CORONARY ARTERY BYPASS GRAFT  2011  . JOINT  REPLACEMENT Bilateral   . TONSILLECTOMY    . TOTAL KNEE ARTHROPLASTY       reports that she has quit smoking. Her smoking use included Cigarettes. She smoked 0.20 packs per day. She has never used smokeless tobacco. She reports that she does not drink alcohol or use drugs.  Allergies  Allergen Reactions  . Penicillins Hives  . Procaine Hcl Palpitations    Family History  Problem Relation Age of Onset  . Diabetes Mother   . Hypertension Mother   . Heart disease Mother     before age 260  . Heart disease Father     before age 72  . Hypertension Father   . Heart attack Father   . Lung cancer Maternal Grandfather   . Lung cancer Paternal Grandfather   . Hypertension Daughter   . Stroke Paternal Grandmother      Prior to Admission medications   Medication Sig Start Date End Date Taking? Authorizing Provider  albuterol (PROAIR HFA) 108 (90 BASE) MCG/ACT inhaler INHALE 2 PUFFS INTO THE LUNGS EVERY 6 (SIX) HOURS AS NEEDED. 01/15/14  Yes Storm FriskPatrick E Wright, MD  ALPRAZolam Prudy Feeler(XANAX) 0.5 MG tablet Take 0.5 mg by mouth 2 (two) times daily as needed for anxiety.    Yes Historical Provider, MD  apixaban (ELIQUIS) 5 MG TABS tablet TAKE 1 TABLET (5 MG TOTAL) BY MOUTH 2 (TWO) TIMES DAILY. 10/16/14  Yes Jake BatheMark C Skains, MD  atorvastatin (LIPITOR) 20 MG tablet TAKE 1 TABLET (20 MG TOTAL) BY MOUTH DAILY.  10/16/14  Yes Jake Bathe, MD  budesonide-formoterol [facility]) 160-4.5 MCG/ACT inhaler Inhale 2 puffs into the lungs 2 (two) times daily. Patient taking differently: Inhale 2 puffs into the lungs 2 (two) times daily as needed (for shortness of breath).  01/15/14  Yes Storm Frisk, MD  citalopram (CELEXA) 20 MG tablet Take 20 mg by mouth 2 (two) times daily.    Yes Historical Provider, MD  furosemide (LASIX) 20 MG tablet Take 1 tablet (20 mg total) by mouth daily. May take additional 20 mg a day for swelling as needed 09/26/15  Yes Jake Bathe, MD  losartan (COZAAR) 50 MG tablet Take 1 tablet (50  mg total) by mouth daily. 07/24/15  Yes Jake Bathe, MD  metoprolol succinate (TOPROL-XL) 25 MG 24 hr tablet TAKE 1 TABLET BY MOUTH EVERY DAY Patient taking differently: Take 25 mg by mouth twice daily 10/21/15  Yes Jake Bathe, MD  oxymetazoline (AFRIN) 0.05 % nasal spray Place 2 sprays into the nose as needed for congestion.    Yes Historical Provider, MD  pantoprazole (PROTONIX) 40 MG tablet Take 40 mg by mouth daily.  10/10/14  Yes Historical Provider, MD  Polyethyl Glycol-Propyl Glycol (SYSTANE ULTRA) 0.4-0.3 % SOLN Place 1 drop into both eyes as needed (for dry eyes).   Yes Historical Provider, MD  sotalol (BETAPACE) 80 MG tablet TAKE 1 TABLET BY MOUTH 2 TIMES DAILY. AND A SECOND TIME WHEN NEEDED. Patient taking differently: TAKE 80 MG BY MOUTH 2 TIMES DAILY. AND A SECOND TIME WHEN NEEDED. 09/16/15  Yes Jake Bathe, MD  tiZANidine (ZANAFLEX) 4 MG tablet Take 4 mg by mouth at bedtime as needed for muscle spasms.   Yes Historical Provider, MD  traMADol (ULTRAM) 50 MG tablet Take 50 mg by mouth every 6 (six) hours as needed for moderate pain.    Yes Historical Provider, MD  clobetasol cream (TEMOVATE) 0.05 % Apply 1 application topically as needed (for skin).     Historical Provider, MD    Physical Exam: Vitals:   10/21/15 1615 10/21/15 1630 10/21/15 1639 10/21/15 1645  BP: (!) 104/53 121/58 104/83 115/75  Pulse: 63 60 62 71  Resp: 25 12 15 18   Temp: 98.7 F (37.1 C)  97.9 F (36.6 C)   TempSrc: Oral  Oral   SpO2: 100% 100% 100% 100%      Constitutional: She appears mildly confused and disoriented, having significant pallor Vitals:   10/21/15 1615 10/21/15 1630 10/21/15 1639 10/21/15 1645  BP: (!) 104/53 121/58 104/83 115/75  Pulse: 63 60 62 71  Resp: 25 12 15 18   Temp: 98.7 F (37.1 C)  97.9 F (36.6 C)   TempSrc: Oral  Oral   SpO2: 100% 100% 100% 100%   Eyes: PERRL, lids, Pale conjunctivae ENMT: Dry Mucous membranes are moist. Posterior pharynx clear of any exudate or  lesions.Normal dentition.  Neck: normal, supple, no masses, no thyromegal Respiratory: clear to auscultation bilaterally, no wheezing, no crackles. Normal respiratory effort. No accessory muscle use. Diminished breath sounds bilaterally. Cardiovascular: Regular rate and rhythm, no murmurs / rubs / gallops. 2+ pedal pulses. No carotid bruits. She has 2+ bilateral extremity pitting edema Abdomen: no tenderness, no masses palpated. No hepatosplenomegaly. Bowel sounds positive.  Musculoskeletal: no clubbing / cyanosis. No joint deformity upper and lower extremities. Good ROM, no contractures. Normal muscle tone.  Skin: no rashes, lesions, ulcers. No induration, significantly pale Neurologic: CN 2-12 grossly intact. Sensation intact, DTR normal. Strength  5/5 in all 4.  Psychiatric: She seems a little confused disoriented in the emergency department   Labs on Admission: I have personally reviewed following labs and imaging studies  CBC:  Recent Labs Lab 10/21/15 1447  WBC 6.5  HGB 4.0*  HCT 14.0*  MCV 90.3  PLT 130*   Basic Metabolic Panel:  Recent Labs Lab 10/21/15 1447  NA 140  K 3.5  CL 107  CO2 23  GLUCOSE 105*  BUN 15  CREATININE 1.85*  CALCIUM 8.1*   GFR: CrCl cannot be calculated (Unknown ideal weight.). Liver Function Tests:  Recent Labs Lab 10/21/15 1447  AST 31  ALT 8*  ALKPHOS 47  BILITOT 1.9*  PROT 4.5*  ALBUMIN 2.8*   No results for input(s): LIPASE, AMYLASE in the last 168 hours. No results for input(s): AMMONIA in the last 168 hours. Coagulation Profile:  Recent Labs Lab 10/21/15 1447  INR 2.72   Cardiac Enzymes:  Recent Labs Lab 10/21/15 1447  TROPONINI 0.04*   BNP (last 3 results) No results for input(s): PROBNP in the last 8760 hours. HbA1C: No results for input(s): HGBA1C in the last 72 hours. CBG: No results for input(s): GLUCAP in the last 168 hours. Lipid Profile: No results for input(s): CHOL, HDL, LDLCALC, TRIG, CHOLHDL,  LDLDIRECT in the last 72 hours. Thyroid Function Tests: No results for input(s): TSH, T4TOTAL, FREET4, T3FREE, THYROIDAB in the last 72 hours. Anemia Panel: No results for input(s): VITAMINB12, FOLATE, FERRITIN, TIBC, IRON, RETICCTPCT in the last 72 hours. Urine analysis:    Component Value Date/Time   COLORURINE AMBER (A) 12/02/2010 1023   APPEARANCEUR CLEAR 12/02/2010 1023   LABSPEC 1.028 12/02/2010 1023   PHURINE 5.0 12/02/2010 1023   GLUCOSEU NEGATIVE 12/02/2010 1023   HGBUR NEGATIVE 12/02/2010 1023   BILIRUBINUR SMALL (A) 12/02/2010 1023   KETONESUR NEGATIVE 12/02/2010 1023   PROTEINUR NEGATIVE 12/02/2010 1023   UROBILINOGEN 1.0 12/02/2010 1023   NITRITE NEGATIVE 12/02/2010 1023   LEUKOCYTESUR NEGATIVE 12/02/2010 1023   Sepsis Labs: !!!!!!!!!!!!!!!!!!!!!!!!!!!!!!!!!!!!!!!!!!!! @LABRCNTIP (procalcitonin:4,lacticidven:4) )No results found for this or any previous visit (from the past 240 hour(s)).   Radiological Exams on Admission: No results found.  EKG: Independently reviewed.   Assessment/Plan Principal Problem:   Acute GI bleeding Active Problems:   Essential hypertension   Symptomatic anemia   PAF (paroxysmal atrial fibrillation) (HCC)   Systolic CHF (HCC)   1.  GI bleed.  Mrs. [name] is a 70 year old female who had been anticoagulated with Eliquis, presenting to the emergency department with complaints of generalized weakness recurrent falls and poor tolerance to physical exertion, found to have a hemoglobin of 4.0. She denies NSAID use or alcohol abuse. In the emergency room found to have bright red blood on rectal exam. Will start her on Protonix drip. She is being transfused with 1 unit of packed red blood cells, plan to transfuse 3 more units of packed red blood cells overnight. Eliquis stopped.  Supportive care with close monitoring in the step down unit. Dr. Randa EvensEdwards of GI has been consulted. Plan for EGD/Colonoscopy  2.  Acute blood loss anemia/symptomatically  anemia. She presents with generalized weakness about to have hemoglobin of 4.0. In the emergency room emergency room provider noting bright red blood on rectal exam. She was unable to report if she has had melena or bright red blood per rectum. Denies hematemesis. Stopped anticoagulant. Plan to transfuse a total of 4 units of packed red blood cells overnight, repeat H&H this evening.  3.  History of paroxysmal atrial fibrillation. Will discontinue beta blocker therapy as she presented with GI bleed and profound anemia and there is concern for hypotension. Anticoagulation discontinued. Will continue Sotalol for now watch blood pressures closely.  4.   Coronary Artery Disease, status post bypass surgery in 2010. She reported having chest pain at home, which likely is secondary to profound anemia. Troponin in ER 0.04. Getting transfused.   5.  History of systolic congestive heart failure. From Dr Anne Fu clinic note dated 07/25/2015 patient having EF of 45-50%, will provide IV Lasix in between transfusion. Monitor volume status closely  6.  Acute on chronic renal failure. Has history of chronic kidney disease stage III, presents with creatinine of 1.85. Suspect secondary to hypovolemia from GI bleed. Providing a total of 4 units of blood overnight, repeat BMP in a.m. Stopping ARB  7.  History of hypertension. Presenting with acute blood loss anemia and GI bleed, will hold Cozaar and metoprolol. Watch blood pressures overnight   DVT prophylaxis: SCD's  Code Status: Full Code Family Communication: I spoke to family present at bedside Disposition Plan: Admit to SDU, anticipate will require greater than 2 nights hospitalization   Consults called: Dr Randa Evens GI Admission status: Inpatient   Jeralyn Bennett MD [facility] Pager [phone]  If 7PM-7AM, please contact night-coverage [email or web address] Password [email or web address]  10/21/2015, 5:08 PM

## 2015-10-21 NOTE — ED Notes (Signed)
Critical HBG 4.0 per Jose Persiaennis Bradley in the lab.  MD made aware.

## 2015-10-21 NOTE — ED Triage Notes (Signed)
EMS - Patient coming from home with c/o of Nausea, diarrhea, palpitations with CP (none today), pale, bleeding from left ear and weakness ongoing since Friday.  Patient states she stopped taking the blood thinner since she has been having bleeding from left ear.  BP 100/50, HR 60, 16 RR, 98% RA and 358 CBG.  Patient given 1L normal saline with EMS.

## 2015-10-21 NOTE — ED Notes (Signed)
Dr. Zamora at bedside.  

## 2015-10-22 ENCOUNTER — Inpatient Hospital Stay (HOSPITAL_COMMUNITY): Payer: Medicare Other | Admitting: Anesthesiology

## 2015-10-22 ENCOUNTER — Encounter (HOSPITAL_COMMUNITY): Payer: Self-pay

## 2015-10-22 ENCOUNTER — Encounter (HOSPITAL_COMMUNITY)
Admission: EM | Disposition: A | Discharge status: Skilled Nursing Facility | Payer: Self-pay | Source: Home / Self Care | Attending: Internal Medicine

## 2015-10-22 HISTORY — PX: ESOPHAGOGASTRODUODENOSCOPY: SHX5428

## 2015-10-22 LAB — PROTIME-INR
INR: 2.02
Prothrombin Time: 23.2 seconds — ABNORMAL HIGH (ref 11.4–15.2)

## 2015-10-22 LAB — BASIC METABOLIC PANEL
Anion gap: 8 (ref 5–15)
BUN: 17 mg/dL (ref 6–20)
CO2: 25 mmol/L (ref 22–32)
Calcium: 8.4 mg/dL — ABNORMAL LOW (ref 8.9–10.3)
Chloride: 107 mmol/L (ref 101–111)
Creatinine, Ser: 1.81 mg/dL — ABNORMAL HIGH (ref 0.44–1.00)
GFR calc Af Amer: 31 mL/min — ABNORMAL LOW (ref >60)
GFR calc non Af Amer: 27 mL/min — ABNORMAL LOW (ref >60)
Glucose, Bld: 103 mg/dL — ABNORMAL HIGH (ref 65–99)
Potassium: 3.4 mmol/L — ABNORMAL LOW (ref 3.5–5.1)
Sodium: 140 mmol/L (ref 135–145)

## 2015-10-22 LAB — CBC
HCT: 31.7 % — ABNORMAL LOW (ref 36.0–46.0)
Hemoglobin: 10 g/dL — ABNORMAL LOW (ref 12.0–15.0)
MCH: 27 pg (ref 26.0–34.0)
MCHC: 31.5 g/dL (ref 30.0–36.0)
MCV: 85.7 fL (ref 78.0–100.0)
Platelets: 99 10*3/uL — ABNORMAL LOW (ref 150–400)
RBC: 3.7 MIL/uL — ABNORMAL LOW (ref 3.87–5.11)
RDW: 18.5 % — ABNORMAL HIGH (ref 11.5–15.5)
WBC: 7.8 10*3/uL (ref 4.0–10.5)

## 2015-10-22 SURGERY — EGD (ESOPHAGOGASTRODUODENOSCOPY)
Anesthesia: Monitor Anesthesia Care

## 2015-10-22 MED ORDER — FUROSEMIDE 10 MG/ML IJ SOLN
20.0000 mg | Freq: Once | INTRAMUSCULAR | Status: AC
  Administered 2015-10-22: 20 mg via INTRAVENOUS
  Filled 2015-10-22: qty 2

## 2015-10-22 MED ORDER — PROPOFOL 500 MG/50ML IV EMUL
INTRAVENOUS | Status: DC | PRN
  Administered 2015-10-22: 75 ug/kg/min via INTRAVENOUS

## 2015-10-22 MED ORDER — SODIUM CHLORIDE 0.9 % IV SOLN
INTRAVENOUS | Status: DC | PRN
  Administered 2015-10-22: 13:00:00 via INTRAVENOUS

## 2015-10-22 MED ORDER — PROPOFOL 10 MG/ML IV BOLUS
INTRAVENOUS | Status: DC | PRN
  Administered 2015-10-22: 20 mg via INTRAVENOUS

## 2015-10-22 MED ORDER — PANTOPRAZOLE SODIUM 40 MG PO TBEC: 40.0000 mg | DELAYED_RELEASE_TABLET | Freq: Every day | ORAL | Status: DC

## 2015-10-22 NOTE — Anesthesia Postprocedure Evaluation (Signed)
Anesthesia Post Note  Patient: Gabrielle [name]  Procedure(s) Performed: Procedure(s) (LRB): ESOPHAGOGASTRODUODENOSCOPY (EGD) (N/A)  Patient location during evaluation: Endoscopy Anesthesia Type: MAC Level of consciousness: awake and alert Pain management: pain level controlled Vital Signs Assessment: post-procedure vital signs reviewed and stable Respiratory status: spontaneous breathing, nonlabored ventilation, respiratory function stable and patient connected to nasal cannula oxygen Cardiovascular status: stable and blood pressure returned to baseline Anesthetic complications: no    Last Vitals:  Vitals:   10/22/15 1420 10/22/15 1455  BP: (!) 150/51 (!) 137/58  Pulse: 64 60  Resp: 20 19  Temp:      Last Pain:  Vitals:   10/22/15 1400  TempSrc: Oral  PainSc:                  Cecile HearingStephen Edward Turk

## 2015-10-22 NOTE — Care Management Note (Signed)
Case Management Note  Patient Details  Name: Roderic PalauVirginia W Lorentz MRN: [id number] Date of Birth: [birth year]  Subjective/Objective:    Patient lives alone, uses a cane , has insurance for medications, she sees Tammy Fulp as PCP, she states she has transport when she goes home.  Patient feels weak right now.  Will need pt/ot eval.  NCM willc to follow for dc needs.                 Action/Plan:   Expected Discharge Date:                  Expected Discharge Plan:  Home w Home Health Services  In-House Referral:     Discharge planning Services  CM Consult  Post Acute Care Choice:    Choice offered to:     DME Arranged:    DME Agency:     HH Arranged:    HH Agency:     Status of Service:  In process, will continue to follow  If discussed at Long Length of Stay Meetings, dates discussed:    Additional Comments:  Leone Havenaylor, Trev Boley Clinton, RN 10/22/2015, 4:40 PM

## 2015-10-22 NOTE — H&P (View-Only) (Signed)
[facility] Reason for consult: G.I. bleed Referring Physician: emergency room. PCP: Dr. Chapman Fitch. Primary G.I.: Dr. Edythe Clarity Gabrielle [name] is an 70 y.o. female.  HPI: she has a history of atrial fibrillation is on Eliquis. She's had progressive weakness for the past several days but no gross signs of bleeding. She is on Eliquis BID took some aspirin began to notice bleeding felt terrible due to severe fatigue came to the emergency room, found to have hemoglobin 4.0. Had dried blood on rectal exam. Patient denies hematochezia or melena. She has had previous colonoscopy by Dr. Watt Climes for follow-up polyps. Last was done in 2010 along with EGD 4 polyp follow-up as well as for diarrhea. Biopsies of the: were negative and biopsies of the small bowel were negative for celiac disease. Repeat colonoscopy was recommended in 2015 but the patient decline. She has chronic problems with itching from psoriasis hypertension chronic swelling of her ankles. She's been on methotrexate for psoriasis. She has chronic back pain but denies taking any NSAIDs. She has COPD followed by Dr. Joya Gaskins. She has chronic constipation. She has back pain and takes Robaxin for back spasms. She does take tramadol as needed for back pain but adamantly denies any NSAIDs.  Past Medical History:  Diagnosis Date  . Anemia   . Arthritis   . Asthma   . Atrial fibrillation ([place])   . Atrial fibrillation with controlled ventricular response ([place]) 11/2009  . Chronic bronchitis   . Chronic headache   . COPD (chronic obstructive pulmonary disease) ([place])   . Depression   . Heart attack ([place])   . Hyperlipidemia   . Hypertension     Past Surgical History:  Procedure Laterality Date  . CAD-CABG     x7, brief post-op Atrial Fib  . CESAREAN SECTION     x2  . CORONARY ARTERY BYPASS GRAFT  2011  . JOINT REPLACEMENT Bilateral   . TONSILLECTOMY    . TOTAL KNEE ARTHROPLASTY      Family History  Problem Relation Age of Onset   . Diabetes Mother   . Hypertension Mother   . Heart disease Mother     before age 53  . Heart disease Father     before age 36  . Hypertension Father   . Heart attack Father   . Lung cancer Maternal Grandfather   . Lung cancer Paternal Grandfather   . Hypertension Daughter   . Stroke Paternal Grandmother     Social History:  reports that she has quit smoking. Her smoking use included Cigarettes. She smoked 0.20 packs per day. She has never used smokeless tobacco. She reports that she does not drink alcohol or use drugs.  Allergies:  Allergies  Allergen Reactions  . Penicillins Hives  . Procaine Hcl Palpitations    Medications; Prior to Admission medications   Medication Sig Start Date End Date Taking? Authorizing Provider  albuterol (PROAIR HFA) 108 (90 BASE) MCG/ACT inhaler INHALE 2 PUFFS INTO THE LUNGS EVERY 6 (SIX) HOURS AS NEEDED. 01/15/14  Yes Elsie Stain, MD  ALPRAZolam Duanne Moron) 0.5 MG tablet Take 0.5 mg by mouth 2 (two) times daily as needed for anxiety.    Yes Historical Provider, MD  apixaban (ELIQUIS) 5 MG TABS tablet TAKE 1 TABLET (5 MG TOTAL) BY MOUTH 2 (TWO) TIMES DAILY. 10/16/14  Yes Jerline Pain, MD  atorvastatin (LIPITOR) 20 MG tablet TAKE 1 TABLET (20 MG TOTAL) BY MOUTH DAILY. 10/16/14  Yes Jerline Pain, MD  budesonide-formoterol (SYMBICORT) 160-4.5 MCG/ACT inhaler Inhale 2 puffs into the lungs 2 (two) times daily. Patient taking differently: Inhale 2 puffs into the lungs 2 (two) times daily as needed (for shortness of breath).  01/15/14  Yes Elsie Stain, MD  citalopram (CELEXA) 20 MG tablet Take 20 mg by mouth 2 (two) times daily.    Yes Historical Provider, MD  furosemide (LASIX) 20 MG tablet Take 1 tablet (20 mg total) by mouth daily. May take additional 20 mg a day for swelling as needed 09/26/15  Yes Jerline Pain, MD  losartan (COZAAR) 50 MG tablet Take 1 tablet (50 mg total) by mouth daily. 07/24/15  Yes Jerline Pain, MD  metoprolol succinate  (TOPROL-XL) 25 MG 24 hr tablet TAKE 1 TABLET BY MOUTH EVERY DAY Patient taking differently: Take 25 mg by mouth twice daily 10/21/15  Yes Jerline Pain, MD  oxymetazoline (AFRIN) 0.05 % nasal spray Place 2 sprays into the nose as needed for congestion.    Yes Historical Provider, MD  pantoprazole (PROTONIX) 40 MG tablet Take 40 mg by mouth daily.  10/10/14  Yes Historical Provider, MD  Polyethyl Glycol-Propyl Glycol (SYSTANE ULTRA) 0.4-0.3 % SOLN Place 1 drop into both eyes as needed (for dry eyes).   Yes Historical Provider, MD  sotalol (BETAPACE) 80 MG tablet TAKE 1 TABLET BY MOUTH 2 TIMES DAILY. AND A SECOND TIME WHEN NEEDED. Patient taking differently: TAKE 80 MG BY MOUTH 2 TIMES DAILY. AND A SECOND TIME WHEN NEEDED. 09/16/15  Yes Jerline Pain, MD  tiZANidine (ZANAFLEX) 4 MG tablet Take 4 mg by mouth at bedtime as needed for muscle spasms.   Yes Historical Provider, MD  traMADol (ULTRAM) 50 MG tablet Take 50 mg by mouth every 6 (six) hours as needed for moderate pain.    Yes Historical Provider, MD  clobetasol cream (TEMOVATE) 7.93 % Apply 1 application topically as needed (for skin).     Historical Provider, MD     PRN Meds  Results for orders placed or performed during the hospital encounter of 10/21/15 (from the past 48 hour(s))  CBC     Status: Abnormal   Collection Time: 10/21/15  2:47 PM  Result Value Ref Range   WBC 6.5 4.0 - 10.5 K/uL   RBC 1.55 (L) 3.87 - 5.11 MIL/uL   Hemoglobin 4.0 (LL) 12.0 - 15.0 g/dL    Comment: REPEATED TO VERIFY CRITICAL RESULT CALLED TO, READ BACK BY AND VERIFIED WITH: K COLLINS,RN 1515 10/21/15 D BRADLEY    HCT 14.0 (L) 36.0 - 46.0 %   MCV 90.3 78.0 - 100.0 fL   MCH 25.8 (L) 26.0 - 34.0 pg   MCHC 28.6 (L) 30.0 - 36.0 g/dL   RDW 19.4 (H) 11.5 - 15.5 %   Platelets 130 (L) 150 - 400 K/uL  Comprehensive metabolic panel     Status: Abnormal   Collection Time: 10/21/15  2:47 PM  Result Value Ref Range   Sodium 140 135 - 145 mmol/L   Potassium 3.5 3.5 -  5.1 mmol/L   Chloride 107 101 - 111 mmol/L   CO2 23 22 - 32 mmol/L   Glucose, Bld 105 (H) 65 - 99 mg/dL   BUN 15 6 - 20 mg/dL   Creatinine, Ser 1.85 (H) 0.44 - 1.00 mg/dL   Calcium 8.1 (L) 8.9 - 10.3 mg/dL   Total Protein 4.5 (L) 6.5 - 8.1 g/dL   Albumin 2.8 (L) 3.5 - 5.0 g/dL  AST 31 15 - 41 U/L   ALT 8 (L) 14 - 54 U/L   Alkaline Phosphatase 47 38 - 126 U/L   Total Bilirubin 1.9 (H) 0.3 - 1.2 mg/dL   GFR calc non Af Amer 26 (L) >60 mL/min   GFR calc Af Amer 30 (L) >60 mL/min    Comment: (NOTE) The eGFR has been calculated using the CKD EPI equation. This calculation has not been validated in all clinical situations. eGFR's persistently <60 mL/min signify possible Chronic Kidney Disease.    Anion gap 10 5 - 15  Protime-INR     Status: Abnormal   Collection Time: 10/21/15  2:47 PM  Result Value Ref Range   Prothrombin Time 29.4 (H) 11.4 - 15.2 seconds   INR 2.72   Type and screen     Status: None (Preliminary result)   Collection Time: 10/21/15  2:47 PM  Result Value Ref Range   ABO/RH(D) O POS    Antibody Screen POS    Sample Expiration 10/24/2015    Antibody Identification PENDING    PT AG Type PENDING    DAT, IgG NEG    Unit Number [id number]    Blood Component Type RED CELLS,LR    Unit division 00    Status of Unit ISSUED    Unit tag comment VERBAL ORDERS PER DR STEINL    Transfusion Status OK TO TRANSFUSE    Crossmatch Result PENDING   Troponin I     Status: Abnormal   Collection Time: 10/21/15  2:47 PM  Result Value Ref Range   Troponin I 0.04 (HH) <0.03 ng/mL    Comment: CRITICAL RESULT CALLED TO, READ BACK BY AND VERIFIED WITHReynold Bowen RN [id number] GREEN R   POC occult blood, ED Provider will collect     Status: Abnormal   Collection Time: 10/21/15  4:09 PM  Result Value Ref Range   Fecal Occult Bld POSITIVE (A) NEGATIVE    No results found. ROS: As above markedly positive            Blood pressure 101/69, pulse 69, temperature  98.1 F (36.7 C), temperature source Oral, resp. rate 17, SpO2 99 %.  Physical exam:   General-- pleasant white female no acute distress ENT-- nonicteric Neck-- no lymphadenopathy Heart-- regular rate and rhythm without murmurs are gallops Lungs-- clear Abdomen-- soft and nontender Psych-- talkative with rambling speech. Appears to be alert and oriented   Assessment: 1. Profound anemia. Patient is not appear to be having any roast bleeding does have some positive stool. I think she'll need another endoscopic evaluation. 2. Chronic back pain 3. CAD status post CABG 4. Paroxysmal afib fibrillation chronically anticoagulated followed by Dr. Luther Parody 5. History of colon polyps  Plan: 1. Would continue the patient on Protonix. She apparently takes is for reflux states that she takes chronically. She needs to be resuscitated with blood. 2. Would keep her own clear liquids and we will check her hemoglobin in the morning. If she is improved we can see about getting her own [facility] EGD tomorrow. 3. Would keep her on a clear liquid diet tonight NPO after midnight. 4 she will likely need colonoscopy this admission but would prefer to wait till she has been adequately transfuse.    Chase Arnall JR,Arvis Miguez L 10/21/2015, 4:18 PM   This note was created using voice recognition software and minor errors may Have occurred unintentionally. Pager: [phone] If no answer or after hours call [phone]

## 2015-10-22 NOTE — Progress Notes (Addendum)
PROGRESS NOTE  Gabrielle [name] ZOX:[id number] DOB: 1945-09-29 DOA: 10/21/2015 PCP: [name], MD  HPI/Recap of past 24 hours: 70 y.o. female with medical history significant of atrial fibrillation currently anticoagulated with Eliquis, as well as history of coronary artery disease status post bypass surgery in 2010, systolic CHF, COPD, presented 8/11 to the emergency department with complaints of profound weakness. Found to have hemoglobin of 4, GI source suspected and transfused 4 units of blood.   This AM post-transfusion Hct is pending after 4th unit of blood is complete. Per patient, she has some right shoulder pain which is chronic for her, denies any nausea, abdominal pain, dark/bloody stools since admission. Per RN, she had some subjective shortness of breath and was started on supplemental O2. Note she had one does of lasix 20mg  IV last night in between units of blood.  Assessment/Plan: 1.  GI bleed.  Denies NSAID use or alcohol abuse. In the emergency room found to have bright red blood on rectal exam. Has been placed on Protonix drip. Eliquis stopped.  4th unit of blood finishing up now, cont supportive care with close monitoring in the step down unit. Check post-transfusion Hct. Dr. Randa Evens of GI has seen and plans EGD today, continue NPO for now. Anticipate colonoscopy to follow, will need prep.  ADDENDUM: Discussed with Dr. Randa Evens, EGD unremarkable, will need colon and will plan for 8/24 to give time for INR to come down (?related to Eliquis) and due to need for propofol.  2.  Acute blood loss anemia/symptomatically anemia. She presents with generalized weakness and hemoglobin of 4.0. In the emergency room emergency room provider noting bright red blood on rectal exam. She denies history of melena or bright red blood per rectum. Denies hematemesis. Stopped anticoagulant, transfusion and recheck of counts as above.  3.  History of paroxysmal atrial fibrillation. Holding ARB and  metoprolol as she presented with GI bleed and profound anemia and there is concern for hypotension. Anticoagulation discontinued. Will continue Sotalol for now watch blood pressures closely.  4.   Coronary Artery Disease, status post bypass surgery in 2010. She reported having chest pain at home, which likely is secondary to profound anemia. No chest pain this AM.   5.  History of systolic congestive heart failure. From Dr Anne Fu clinic note dated 07/25/2015 patient having EF of 45-50%. Got one dose of lasix last night, will give another dose 20mg  IV now. Monitor volume status closely  6.  Acute on chronic renal failure. Has history of chronic kidney disease stage III, presents with creatinine of 1.85. Suspect secondary to hypovolemia from GI bleed. Providing a total of 4 units of blood overnight, repeat BMP with AM labs after last unit of blood. Note we held ARB at admission.  7.  History of hypertension. Presenting with acute blood loss anemia and GI bleed, will hold Cozaar and metoprolol. BPs have been stable.   DVT ppx: SCDs due to GI bleed.  Code Status: FULL   Family Communication: Discussed with patient and son at the bedside this morning.   Disposition Plan: Likely to home at discharge.    Consultants:  GI Dr. Randa Evens  Procedures:  Plan EGD 8/22   Antimicrobials:  None    Objective: Vitals:   10/22/15 0620 10/22/15 0735 10/22/15 0750 10/22/15 0753  BP: 128/61 (!) 121/54    Pulse: 64 (!) 55 62 62  Resp: 11 17 14 17   Temp: 98.5 F (36.9 C) 98.7 F (37.1 C)  TempSrc: Oral Oral    SpO2: 90% (!) 86% (!) 83% 97%  Weight:      Height:        Intake/Output Summary (Last 24 hours) at 10/22/15 [id number] Last data filed at 10/22/15 0600  Gross per 24 hour  Intake          1076.75 ml  Output              550 ml  Net           526.75 ml   Filed Weights   10/21/15 1840  Weight: 87.4 kg (192 lb 10.9 oz)    Exam: General:  Alert, oriented, calm, in no acute  distress Eyes: pupils round and reactive to light and accomodation, clear sclerea Neck: supple, no masses, trachea mildline  Cardiovascular: irregular, no murmurs or rubs, no peripheral edema  Respiratory: clear to auscultation bilaterally, no wheezes, no crackles  Abdomen: soft, nontender, nondistended, normal bowel tones heard  Skin: dry, no rashes  Musculoskeletal: no joint effusions, normal range of motion  Psychiatric: appropriate affect, normal speech  Neurologic: extraocular muscles intact, clear speech, moving all extremities with intact sensorium    Data Reviewed: CBC:  Recent Labs Lab 10/21/15 1447 10/21/15 2131  WBC 6.5  --   HGB 4.0* 6.0*  HCT 14.0* 19.7*  MCV 90.3  --   PLT 130*  --    Basic Metabolic Panel:  Recent Labs Lab 10/21/15 1447  NA 140  K 3.5  CL 107  CO2 23  GLUCOSE 105*  BUN 15  CREATININE 1.85*  CALCIUM 8.1*   GFR: Estimated Creatinine Clearance: 28.6 mL/min (by C-G formula based on SCr of 1.85 mg/dL). Liver Function Tests:  Recent Labs Lab 10/21/15 1447  AST 31  ALT 8*  ALKPHOS 47  BILITOT 1.9*  PROT 4.5*  ALBUMIN 2.8*   No results for input(s): LIPASE, AMYLASE in the last 168 hours. No results for input(s): AMMONIA in the last 168 hours. Coagulation Profile:  Recent Labs Lab 10/21/15 1447  INR 2.72   Cardiac Enzymes:  Recent Labs Lab 10/21/15 1447  TROPONINI 0.04*   BNP (last 3 results) No results for input(s): PROBNP in the last 8760 hours. HbA1C: No results for input(s): HGBA1C in the last 72 hours. CBG: No results for input(s): GLUCAP in the last 168 hours. Lipid Profile: No results for input(s): CHOL, HDL, LDLCALC, TRIG, CHOLHDL, LDLDIRECT in the last 72 hours. Thyroid Function Tests: No results for input(s): TSH, T4TOTAL, FREET4, T3FREE, THYROIDAB in the last 72 hours. Anemia Panel: No results for input(s): VITAMINB12, FOLATE, FERRITIN, TIBC, IRON, RETICCTPCT in the last 72 hours. Urine analysis:      Component Value Date/Time   COLORURINE YELLOW 10/21/2015 1742   APPEARANCEUR HAZY (A) 10/21/2015 1742   LABSPEC 1.018 10/21/2015 1742   PHURINE 5.0 10/21/2015 1742   GLUCOSEU NEGATIVE 10/21/2015 1742   HGBUR MODERATE (A) 10/21/2015 1742   BILIRUBINUR SMALL (A) 10/21/2015 1742   KETONESUR NEGATIVE 10/21/2015 1742   PROTEINUR 30 (A) 10/21/2015 1742   UROBILINOGEN 1.0 12/02/2010 1023   NITRITE NEGATIVE 10/21/2015 1742   LEUKOCYTESUR SMALL (A) 10/21/2015 1742   Sepsis Labs: @LABRCNTIP (procalcitonin:4,lacticidven:4)  ) Recent Results (from the past 240 hour(s))  MRSA PCR Screening     Status: Abnormal   Collection Time: 10/21/15  6:53 PM  Result Value Ref Range Status   MRSA by PCR POSITIVE (A) NEGATIVE Final    Comment:  The GeneXpert MRSA Assay (FDA approved for NASAL specimens only), is one component of a comprehensive MRSA colonization surveillance program. It is not intended to diagnose MRSA infection nor to guide or monitor treatment for MRSA infections. RESULT CALLED TO, READ BACK BY AND VERIFIED WITH: H RICHARD 10/21/15 @ 2133 M VESTAL       Studies: No results found.  Scheduled Meds: . furosemide  20 mg Intravenous Once  . sodium chloride flush  3 mL Intravenous Q12H  . sodium chloride flush  3 mL Intravenous Q12H  . sotalol  80 mg Oral Q12H    Continuous Infusions: . pantoprozole (PROTONIX) infusion 8 mg/hr (10/22/15 0015)     LOS: 1 day   Time spent: 23 minutes  Deavion Strider Vergie LivingMohammed Kade Demicco, MD [facility] Pager [phone]  If 7PM-7AM, please contact night-coverage [email or web address] Password [email or web address] 10/22/2015, 8:08 AM

## 2015-10-22 NOTE — Anesthesia Preprocedure Evaluation (Addendum)
Anesthesia Evaluation  Patient identified by MRN, date of birth, ID band Patient awake    Reviewed: Allergy & Precautions, NPO status , Patient's Chart, lab work & pertinent test results  Airway Mallampati: II  TM Distance: >3 FB Neck ROM: Full    Dental  (+) Edentulous Upper, Edentulous Lower   Pulmonary asthma , COPD, former smoker,    breath sounds clear to auscultation       Cardiovascular hypertension, + CAD, + Past MI, + CABG, + Peripheral Vascular Disease and +CHF   Rhythm:Regular Rate:Normal  10/2009: Left ventricle: The cavity size was normal. There was mild  concentric hypertrophy. Systolic function was normal. The  estimated ejection fraction was in the range of 55% to 60%. Cannot  exclude mild hypokinesis of the inferior myocardium. - Mitral valve: Moderately calcified annulus. Mildly thickened,  mildly calcified leaflets . Mild thickening and calcification,  with mild involvement of chords. Mild regurgitation. Valve area by  pressure half-time: 2.5cm^2. - Left atrium: The atrium was mildly dilated. - Pulmonary arteries: PA peak pressure: 32mm Hg (S).   Neuro/Psych  Headaches, Depression    GI/Hepatic negative GI ROS, Neg liver ROS,   Endo/Other  negative endocrine ROS  Renal/GU negative Renal ROS     Musculoskeletal   Abdominal   Peds  Hematology  (+) anemia ,   Anesthesia Other Findings   Reproductive/Obstetrics                            Lab Results  Component Value Date   WBC 7.8 10/22/2015   HGB 10.0 (L) 10/22/2015   HCT 31.7 (L) 10/22/2015   MCV 85.7 10/22/2015   PLT 99 (L) 10/22/2015   Lab Results  Component Value Date   CREATININE 1.81 (H) 10/22/2015   BUN 17 10/22/2015   NA 140 10/22/2015   K 3.4 (L) 10/22/2015   CL 107 10/22/2015   CO2 25 10/22/2015    Anesthesia Physical Anesthesia Plan  ASA: III  Anesthesia Plan: MAC   Post-op Pain  Management:    Induction: Intravenous  Airway Management Planned: Natural Airway and Nasal Cannula  Additional Equipment:   Intra-op Plan:   Post-operative Plan:   Informed Consent: I have reviewed the patients History and Physical, chart, labs and discussed the procedure including the risks, benefits and alternatives for the proposed anesthesia with the patient or authorized representative who has indicated his/her understanding and acceptance.   Dental advisory given  Plan Discussed with:   Anesthesia Plan Comments:         Anesthesia Quick Evaluation

## 2015-10-22 NOTE — Op Note (Signed)
[facility] Patient Name: Gabrielle [name] Procedure Date : 10/22/2015 MRN: [id number] Attending MD: Tresea Mall Dr., MD Date of Birth: 1945/10/07 CSN: [id number] Age: 70 Admit Type: Inpatient Procedure:                Upper GI endoscopy Indications:              Iron deficiency anemia secondary to chronic blood                            loss, Heme positive stool, Melena Hg 4.0 Providers:                Fayrene Fearing L. Pawel Soules Dr., MD, Tomma Rakers, RN,                            Arlee Muslim Tech., Technician, Orvilla Fus, CRNA Referring MD:              Medicines:                Propofol per Anesthesia Complications:            No immediate complications. Estimated Blood Loss:     Estimated blood loss: none. Procedure:                Pre-Anesthesia Assessment:                           - Prior to the procedure, a History and Physical                            was performed, and patient medications and                            allergies were reviewed. The patient's tolerance of                            previous anesthesia was also reviewed. The risks                            and benefits of the procedure and the sedation                            options and risks were discussed with the patient.                            All questions were answered, and informed consent                            was obtained. Prior Anticoagulants: The patient has                            taken Eliquis (apixaban), last dose was 2 days                            prior to procedure. ASA Grade Assessment: III - A  patient with severe systemic disease. After                            reviewing the risks and benefits, the patient was                            deemed in satisfactory condition to undergo the                            procedure.                           After obtaining informed consent, the endoscope was   passed under direct vision. Throughout the                            procedure, the patient's blood pressure, pulse, and                            oxygen saturations were monitored continuously. The                            [id number]-[id number] ([id number]) scope was introduced through the                            mouth, and advanced to the second part of duodenum.                            The upper GI endoscopy was accomplished without                            difficulty. The patient tolerated the procedure                            well. Scope In: Scope Out: Findings:      The esophagus was normal.      The stomach was normal.      The examined duodenum was normal. Impression:               - Normal esophagus.                           - Normal stomach.                           - Normal examined duodenum.                           - No specimens collected.                           - Blood in stool without cause found on endoscopic                            exam Moderate Sedation:      See anesthesia note, no moderate sedation. Recommendation:           -  Patient has a contact number available for                            emergencies. The signs and symptoms of potential                            delayed complications were discussed with the                            patient. Return to normal activities tomorrow.                            Written discharge instructions were provided to the                            patient.                           - No aspirin, ibuprofen, naproxen, or other                            non-steroidal anti-inflammatory drugs.                           - Perform a colonoscopy in 2 days.                           - Return patient to hospital ward for ongoing care.                           - Clear liquid diet.                           - Continue present medications. Procedure Code(s):        --- Professional ---                           [phone],  Esophagogastroduodenoscopy, flexible,                            transoral; diagnostic, including collection of                            specimen(s) by brushing or washing, when performed                            (separate procedure) Diagnosis Code(s):        --- Professional ---                           D50.0, Iron deficiency anemia secondary to blood                            loss (chronic)                           R19.5, Other fecal  abnormalities                           K92.1, Melena (includes Hematochezia) CPT copyright 2016 [employer]. All rights reserved. The codes documented in this report are preliminary and upon coder review may  be revised to meet current compliance requirements. Tresea Mall Dr., MD 10/22/2015 2:21:22 PM This report has been signed electronically. Number of Addenda: 0

## 2015-10-22 NOTE — Interval H&P Note (Signed)
History and Physical Interval Note:  10/22/2015 1:14 PM  Gabrielle [name]  has presented today for surgery, with the diagnosis of GI bleed  The various methods of treatment have been discussed with the patient and family. After consideration of risks, benefits and other options for treatment, the patient has consented to  Procedure(s): ESOPHAGOGASTRODUODENOSCOPY (EGD) (N/A) as a surgical intervention .  The patient's history has been reviewed, patient examined, no change in status, stable for surgery.  I have reviewed the patient's chart and labs.  Questions were answered to the patient's satisfaction.     Anni Hocevar JR,Judieth Mckown L

## 2015-10-22 NOTE — Transfer of Care (Signed)
Immediate Anesthesia Transfer of Care Note  Patient: Roderic PalauVirginia W Manfre  Procedure(s) Performed: Procedure(s): ESOPHAGOGASTRODUODENOSCOPY (EGD) (N/A)  Patient Location: Endoscopy Unit  Anesthesia Type:MAC  Level of Consciousness: sedated  Airway & Oxygen Therapy: Patient Spontanous Breathing and Patient connected to nasal cannula oxygen  Post-op Assessment: Report given to RN and Post -op Vital signs reviewed and stable  Post vital signs: Reviewed and stable  Last Vitals:  Vitals:   10/22/15 1123 10/22/15 1259  BP: (!) 122/55 (!) 137/47  Pulse: 65 65  Resp: 18 15  Temp: 36.6 C 37.1 C    Last Pain:  Vitals:   10/22/15 1259  TempSrc: Oral  PainSc:       Patients Stated Pain Goal: 1 (10/22/15 0616)  Complications: No apparent anesthesia complications

## 2015-10-22 NOTE — Progress Notes (Signed)
[facility] PROGRESS NOTE Subjective patient complaining of pain in her shoulders. Her hemoglobin is up to 6 after transfusion with no gross bleeding. Her family is at the bedside and confirms that she does not take any NSAIDs to the best of their knowledge.  Objective: Vital signs in last 24 hours: Temp:  [97.6 F (36.4 C)-98.7 F (37.1 C)] 98.7 F (37.1 C) (08/22 0830) Pulse Rate:  [45-71] 64 (08/22 0830) Resp:  [11-25] 18 (08/22 0830) BP: (100-146)/(30-83) 135/60 (08/22 0830) SpO2:  [83 %-100 %] 98 % (08/22 0830) Weight:  [87.4 kg (192 lb 10.9 oz)] 87.4 kg (192 lb 10.9 oz) (08/21 1840) Last BM Date: 10/21/15  Intake/Output from previous day: 08/21 0701 - 08/22 0700 In: 1076.8 [I.V.:71.8; Blood:1005] Out: 550 [Urine:550] Intake/Output this shift: No intake/output data recorded.  PE: General-- somnolent  in no distress  Lungs-- clear Abdomen-- nontender  Lab Results:  Recent Labs  10/21/15 1447 10/21/15 2131  WBC 6.5  --   HGB 4.0* 6.0*  HCT 14.0* 19.7*  PLT 130*  --    BMET  Recent Labs  10/21/15 1447  NA 140  K 3.5  CL 107  CO2 23  CREATININE 1.85*   LFT  Recent Labs  10/21/15 1447  PROT 4.5*  AST 31  ALT 8*  ALKPHOS 47  BILITOT 1.9*   PT/INR  Recent Labs  10/21/15 1447  LABPROT 29.4*  INR 2.72   PANCREAS No results for input(s): LIPASE in the last 72 hours.       Studies/Results: No results found.  Medications: I have reviewed the patient's current medications.  Assessment/Plan: 1. Profound anemia/positive stools. We will start off with EGD today. Due to the pain medications and Xanax that the patient is taking plan on using propofol sedation. Procedure is scheduled 1 o'clock today. Have discussed EGD with the patient and family.   Kealohilani Maiorino JR,Dal Blew L 10/22/2015, 9:33 AM  This note was created using voice recognition software. Minor errors may Have occurred unintentionally.  Pager: [phone] If no answer or  after hours call [phone]

## 2015-10-23 DIAGNOSIS — I10 Essential (primary) hypertension: Secondary | ICD-10-CM

## 2015-10-23 DIAGNOSIS — I480 Paroxysmal atrial fibrillation: Secondary | ICD-10-CM

## 2015-10-23 LAB — CBC
HCT: 29.5 % — ABNORMAL LOW (ref 36.0–46.0)
Hemoglobin: 9.3 g/dL — ABNORMAL LOW (ref 12.0–15.0)
MCH: 26.9 pg (ref 26.0–34.0)
MCHC: 31.5 g/dL (ref 30.0–36.0)
MCV: 85.3 fL (ref 78.0–100.0)
Platelets: 92 10*3/uL — ABNORMAL LOW (ref 150–400)
RBC: 3.46 MIL/uL — ABNORMAL LOW (ref 3.87–5.11)
RDW: 19.5 % — ABNORMAL HIGH (ref 11.5–15.5)
WBC: 8 10*3/uL (ref 4.0–10.5)

## 2015-10-23 LAB — BASIC METABOLIC PANEL
Anion gap: 6 (ref 5–15)
BUN: 15 mg/dL (ref 6–20)
CO2: 27 mmol/L (ref 22–32)
Calcium: 8.3 mg/dL — ABNORMAL LOW (ref 8.9–10.3)
Chloride: 106 mmol/L (ref 101–111)
Creatinine, Ser: 1.65 mg/dL — ABNORMAL HIGH (ref 0.44–1.00)
GFR calc Af Amer: 35 mL/min — ABNORMAL LOW (ref >60)
GFR calc non Af Amer: 30 mL/min — ABNORMAL LOW (ref >60)
Glucose, Bld: 122 mg/dL — ABNORMAL HIGH (ref 65–99)
Potassium: 3.7 mmol/L (ref 3.5–5.1)
Sodium: 139 mmol/L (ref 135–145)

## 2015-10-23 LAB — APTT: aPTT: 35 seconds (ref 24–36)

## 2015-10-23 LAB — PROTIME-INR
INR: 2.01
Prothrombin Time: 23 seconds — ABNORMAL HIGH (ref 11.4–15.2)

## 2015-10-23 LAB — HEPARIN LEVEL (UNFRACTIONATED): Heparin Unfractionated: 0.75 IU/mL — ABNORMAL HIGH (ref 0.30–0.70)

## 2015-10-23 MED ORDER — CHLORHEXIDINE GLUCONATE CLOTH 2 % EX PADS
6.0000 | MEDICATED_PAD | Freq: Every day | CUTANEOUS | Status: DC
  Administered 2015-10-24 – 2015-10-25 (×2): 6 via TOPICAL

## 2015-10-23 MED ORDER — PEG 3350-KCL-NA BICARB-NACL 420 G PO SOLR
4000.0000 mL | Freq: Once | ORAL | Status: AC
  Administered 2015-10-23: 4000 mL via ORAL
  Filled 2015-10-23: qty 4000

## 2015-10-23 MED ORDER — MUPIROCIN 2 % EX OINT
1.0000 "application " | TOPICAL_OINTMENT | Freq: Two times a day (BID) | CUTANEOUS | Status: DC
  Administered 2015-10-23 – 2015-10-24 (×4): 1 via NASAL
  Filled 2015-10-23 (×2): qty 22

## 2015-10-23 NOTE — Evaluation (Signed)
Occupational Therapy Evaluation Patient Details Name: Gabrielle PalauVirginia W Achey MRN: [id number] DOB: [birth year] Today's Date: 10/23/2015    History of Present Illness 70 y.o. female [facility] for weakness.  Pt found to have severe anemia/(+) heme stools, s/p PRBCs and upper endoscopy (which did not reveal anything of significance).  Pt with PMHx of HTN, MI, COPD, A fib, TKA, CABG, and CAD.    Clinical Impression   PTA, pt was independent with basic ADLs and [facility] however she reports that these basic tasks have become increasingly more difficult for her to complete. Pt currently requires min assist for ADLs and min guard assist for basic transfers. Pt plans to d/c home with intermittent assistance from her sister. Pt will benefit from continued acute OT to increase independence and safety with ADLs and mobility to allow for safe discharge home. Recommend HHOT at d/c and 3in1 for home use.    Follow Up Recommendations  Home health OT;Supervision - Intermittent    Equipment Recommendations  3 in 1 bedside comode;Other (comment) (Adaptive equipment)    Recommendations for Other Services       Precautions / Restrictions Precautions Precautions: Fall Restrictions Weight Bearing Restrictions: No      Mobility Bed Mobility               General bed mobility comments: Pt on toilet on OT arrival  Transfers Overall transfer level: Needs assistance Equipment used: Rolling walker (2 wheeled) Transfers: Sit to/from Stand Sit to Stand: Min guard         General transfer comment: Min guard assist for safety and balance. VCs for safe hand placement    Balance Overall balance assessment: Needs assistance Sitting-balance support: No upper extremity supported;Feet supported Sitting balance-Leahy Scale: Good     Standing balance support: No upper extremity supported;During functional activity Standing balance-Leahy Scale: Fair Standing balance comment:  able to maintain balance without UE support for static standing tasks                            ADL Overall ADL's : Needs assistance/impaired Eating/Feeding: Set up;Sitting   Grooming: Wash/dry hands;Min guard;Standing                   Toilet Transfer: Min guard;Ambulation;Comfort height toilet;RW   Toileting- Clothing Manipulation and Hygiene: Minimal assistance;Sit to/from stand       Functional mobility during ADLs: Min guard;Rolling walker       Vision Vision Assessment?: Vision impaired- to be further tested in functional context   Perception     Praxis      Pertinent Vitals/Pain Pain Assessment: Faces Faces Pain Scale: Hurts little more Pain Location: stomach Pain Descriptors / Indicators: Aching Pain Intervention(s): Monitored during session     Hand Dominance Right   Extremity/Trunk Assessment Upper Extremity Assessment Upper Extremity Assessment: Generalized weakness   Lower Extremity Assessment Lower Extremity Assessment: Generalized weakness   Cervical / Trunk Assessment Cervical / Trunk Assessment: Other exceptions Cervical / Trunk Exceptions: rounded shoulders, forward head   Communication Communication Communication: No difficulties   Cognition Arousal/Alertness: Awake/alert Behavior During Therapy: WFL for tasks assessed/performed Overall Cognitive Status: Within Functional Limits for tasks assessed (will want to assess further)                     General Comments       Exercises  Shoulder Instructions      Home Living Family/patient expects to be discharged to:: Private residence Living Arrangements: Alone Available Help at Discharge: Family;Available PRN/intermittently Type of Home: House Home Access: Stairs to enter Entergy CorporationEntrance Stairs-Number of Steps: 8 Entrance Stairs-Rails: Right;Left;Can reach both Home Layout: Multi-level;Bed/bath upstairs Alternate Level Stairs-Number of Steps: 10    Bathroom Shower/Tub: Producer, television/film/videoWalk-in shower   Bathroom Toilet: Standard     Home Equipment: Cane - single point;Shower seat - built in;Grab bars - tub/shower   Additional Comments: To enter pt's house she has 8 steps; once inside house pt has to go up 10 more steps to reach "main level" where her bedroom/bathroom, living room and kitchen are located. Pt's sister reports that she can only assist intermittently.      Prior Functioning/Environment Level of Independence: Needs assistance  Gait / Transfers Assistance Needed: Uses [facility] ADL's / Homemaking Assistance Needed: Independent with ADLs however they are becoming increasingly more difficult to do; sister does grocery shopping and drives pt to appointments        OT Diagnosis: Generalized weakness;Disturbance of vision;Acute pain   OT Problem List: Decreased strength;Decreased activity tolerance;Impaired balance (sitting and/or standing);Decreased safety awareness;Decreased knowledge of use of DME or AE;Obesity;Pain;Impaired vision/perception   OT Treatment/Interventions: Self-care/ADL training;Therapeutic exercise;Energy conservation;DME and/or AE instruction;Therapeutic activities;Patient/family education;Balance training    OT Goals(Current goals can be found in the care plan section) Acute Rehab OT Goals Patient Stated Goal: to get stronger, find out what is wrong with her OT Goal Formulation: With patient/family Time For Goal Achievement: 11/06/15 Potential to Achieve Goals: Good ADL Goals Pt Will Perform Grooming: standing;with modified independence Pt Will Perform Upper Body Bathing: with modified independence;sitting Pt Will Perform Lower Body Bathing: with modified independence;with adaptive equipment;sit to/from stand Pt Will Transfer to Toilet: with modified independence;ambulating;bedside commode (over toilet) Pt/caregiver will Perform Home Exercise Program: Increased strength;Both right and left upper extremity;With  theraband;Independently;With written HEP provided Additional ADL Goal #1: Pt will verbalize 3 energy conservation strategies to use during daily tasks with min verbal cues.  OT Frequency: Min 2X/week   Barriers to D/C: Decreased caregiver support  Lives alone       Co-evaluation              End of Session Equipment Utilized During Treatment: Gait belt;Rolling walker Nurse Communication: Mobility status  Activity Tolerance: Patient tolerated treatment well Patient left: in chair;with call bell/phone within reach;with restraints reapplied   Time: [id number] OT Time Calculation (min): 29 min Charges:  OT General Charges $OT Visit: 1 Procedure OT Evaluation $OT Eval Moderate Complexity: 1 Procedure OT Treatments $Self Care/Home Management : 8-22 mins G-Codes:    Nils PyleJulia Rhondalyn Clingan, OTR/L Pager: [phone] 10/23/2015, 4:43 PM

## 2015-10-23 NOTE — Care Management Note (Signed)
Case Management Note  Patient Details  Name: Gabrielle [name] MRN: [id number] Date of Birth: [birth year]  Subjective/Objective:    Patient lives alone, uses a cane , has insurance for medications, she sees Gabrielle [name] as PCP, she states she has transport when she goes home. Await pt/ot eval.  NCM willc to follow for dc needs.                                Action/Plan:   Expected Discharge Date:                  Expected Discharge Plan:  Home w Home Health Services  In-House Referral:     Discharge planning Services  CM Consult  Post Acute Care Choice:    Choice offered to:     DME Arranged:    DME Agency:     HH Arranged:    HH Agency:     Status of Service:  In process, will continue to follow  If discussed at Long Length of Stay Meetings, dates discussed:    Additional Comments:  Leone Havenaylor, Malikhi Ogan Clinton, RN 10/23/2015, 11:27 AM

## 2015-10-23 NOTE — Evaluation (Signed)
Physical Therapy Evaluation Patient Details Name: Gabrielle [name] MRN: [id number] DOB: 19-Nov-1945 Today's Date: 10/23/2015   History of Present Illness  70 [facility]. [facility] for [facility].  Pt found to have severe anemia/(+) heme stools, s/p PRBCs and upper endoscopy (which did not reveal anything of significance).  Pt with PMHx of HTN, MI, COPD, A fib, TKA, CABG, and CAD.   Clinical Impression  Pt is generally weak and deconditioned, requiring a walker now where PTA she only used a cane PRN.  She has a multilevel home and was struggling PTA with doing the stairs for home entry and access.  Pt would benefit from HHPT at discharge and pt reports her twin sister can come stay with her until she gets stronger.   PT to follow acutely for deficits listed below.       Follow Up Recommendations Home health PT;Supervision for mobility/OOB    Equipment Recommendations  Rolling walker with 5" wheels    Recommendations for Other Services   NA    Precautions / Restrictions Precautions Precautions: Fall      Mobility  Bed Mobility               General bed mobility comments: Pt OOB in chair  Transfers Overall transfer level: Needs assistance Equipment used: Rolling walker (2 wheeled) Transfers: Sit to/from Stand Sit to Stand: Min guard         General transfer comment: Min guard assist for safety  Ambulation/Gait Ambulation/Gait assistance: Min guard Ambulation Distance (Feet): 300 Feet Assistive device: Rolling walker (2 wheeled) Gait Pattern/deviations: Step-through pattern;Shuffle Gait velocity: decreased   General Gait Details: Pt with slow, suffling gait pattern, cues for safe RW use, cues for pt to steer around obstacles.          Balance Overall balance assessment: Needs assistance Sitting-balance support: Feet supported Sitting balance-Leahy Scale: Good     Standing balance support: Bilateral upper extremity supported;No upper  extremity supported;Single extremity supported Standing balance-Leahy Scale: Fair                               Pertinent Vitals/Pain Pain Assessment: Faces Faces Pain Scale: Hurts little more Pain Location: bil shoulders Pain Descriptors / Indicators: Aching;Burning Pain Intervention(s): Limited activity within patient's tolerance;Monitored during session;Repositioned;Patient requesting pain meds-RN notified    Home Living Family/patient expects to be discharged to:: Private residence Living Arrangements: Alone Available Help at Discharge: Family;Available 24 hours/day (sister can stay with her) Type of Home: House Home Access: Stairs to enter Entrance Stairs-Rails: Right;Left;Can reach both Entrance Stairs-Number of Steps: 8 Home Layout: Multi-level;Bed/bath upstairs Home Equipment: Cane - single point;Shower seat Additional Comments: Pt reports she sleeps on the couch downstairs most nights.    Prior Function Level of Independence: Independent;Independent with assistive device(s)         Comments: at times uses cane        Extremity/Trunk Assessment   Upper Extremity Assessment: Defer to OT evaluation           Lower Extremity Assessment: Generalized weakness      Cervical / Trunk Assessment: Normal  Communication   Communication: No difficulties  Cognition Arousal/Alertness: Awake/alert Behavior During Therapy: WFL for tasks assessed/performed Overall Cognitive Status: Within Functional Limits for tasks assessed (not specifically tested)  Assessment/Plan    PT Assessment Patient needs continued PT services  PT Diagnosis Difficulty walking;Abnormality of gait;Generalized weakness   PT Problem List Decreased strength;Decreased activity tolerance;Decreased balance;Decreased mobility;Decreased knowledge of use of DME  PT Treatment Interventions DME instruction;Gait training;Stair training;Functional  mobility training;Therapeutic activities;Balance training;Therapeutic exercise;Patient/family education   PT Goals (Current goals can be found in the Care Plan section) Acute Rehab PT Goals Patient Stated Goal: to get stronger, find out what is wrong with her PT Goal Formulation: With patient Time For Goal Achievement: 11/06/15 Potential to Achieve Goals: Good    Frequency Min 3X/week   Barriers to discharge Inaccessible home environment multilevel home       End of Session Equipment Utilized During Treatment: Gait belt Activity Tolerance: Patient limited by fatigue Patient left: Other (comment) (in bathroom to have a BM with sister's supervision. ) Nurse Communication: Mobility status         Time: [id number] PT Time Calculation (min) (ACUTE ONLY): 24 min   Charges:   PT Evaluation $PT Eval Moderate Complexity: 1 Procedure PT Treatments $Gait Training: 8-22 mins        Meridee Branum B. Rhyse Loux, PT, DPT [phone]   10/23/2015, 1:19 PM

## 2015-10-23 NOTE — Progress Notes (Signed)
PROGRESS NOTE  Gabrielle [name] ZOX:[id number] DOB: 08/24/1945 DOA: 10/21/2015 PCP: [name], MD  HPI/Recap of past 24 hours: 70 y.o. female with medical history significant of atrial fibrillation currently anticoagulated with Eliquis, as well as history of coronary artery disease status post bypass surgery in 2010, systolic CHF, COPD, presented 8/11 to the emergency department with complaints of profound weakness. Found to have hemoglobin of 4, GI source suspected and transfused 4 units of blood. EGD unremarkable. GI plan for colonoscopy on 8/24.   Assessment/Plan: GI bleed.   - Denies NSAID use or alcohol abuse.  - In the emergency room found to have bright red blood on rectal exam.  - Had been placed on Protonix drip. Eliquis stopped.   - EGD unremarkable, will need colon and planned for 8/24 to give time for INR to come down (?related to Eliquis) and due to need for propofol. - No overt GI bleeding reported  Acute blood loss anemia/symptomatically anemia.  - She presented with generalized weakness and hemoglobin of 4.0.  - In the emergency room emergency room provider noting bright red blood on rectal exam.  - She denies history of melena or bright red blood per rectum. Denies hematemesis. Stopped anticoagulant,  - S/P 4 PRBC's. Hb improved. Monitor daily CBCs and transfuse if hemoglobin less than 7 g per DL.  Thrombocytopenia - Possibly related to acute blood loss. Platelets stable in the 90 range for the last 2 days. Follow CBCs  History of paroxysmal atrial fibrillation.  - Holding ARB and metoprolol as she presented with GI bleed and profound anemia and there is concern for hypotension. Anticoagulation discontinued.  - Will continue Sotalol for now watch blood pressures closely.  Coronary Artery Disease, status post bypass surgery in 2010.  - She reported having chest pain at home, which likely is secondary to profound anemia. No chest pain since.   History of  systolic congestive heart failure.  - From Dr Anne Fu clinic note dated 07/25/2015 patient having EF of 45-50%. Got a dose of Lasix on 8/21 and 8/22. Monitor volume status closely. Clinically seems compensated.  Acute on chronic renal failure.  - Has history of chronic kidney disease stage III, presents with creatinine of 1.85. Suspect secondary to hypovolemia from GI bleed.  - Note we held ARB at admission. - Creatinine improved to 1.65. Follow BMP - Baseline creatinine not known. Last creatinine in Epic is from 12/01/10:1.2  History of hypertension.  - Presenting with acute blood loss anemia and GI bleed, will hold Cozaar and metoprolol. BPs have been stable.   DVT ppx: SCDs due to GI bleed. Code Status: FULL  Family Communication: Discussed with patient. No family at bedside.  Disposition Plan: Admitted to stepdown unit. DC home when medically improved.    Consultants:  GI Dr. Randa Evens  Procedures:  EGD 8/22   Antimicrobials:  None   Subjective: Complains of chronic right shoulder pain. No chest pain reported. No blood per rectum or black stools. As per RN, mild confusion which may be her baseline. No other acute events reported  Objective: Vitals:   10/23/15 0800 10/23/15 0828 10/23/15 1116 10/23/15 1540  BP:  (!) 116/38 (!) 142/65   Pulse: (!) 58  (!) 56 (!) 59  Resp: 19  17 20   Temp:  98.1 F (36.7 C)    TempSrc:  Oral    SpO2: 99%  98% 97%  Weight:      Height:  Intake/Output Summary (Last 24 hours) at 10/23/15 1718 Last data filed at 10/23/15 1500  Gross per 24 hour  Intake          1396.67 ml  Output              375 ml  Net          1021.67 ml   Filed Weights   10/21/15 1840 10/22/15 1259  Weight: 87.4 kg (192 lb 10.9 oz) 85.3 kg (188 lb)    Exam: General:  Alert, oriented, calm, in no acute distress. Sitting up comfortably in chair this morning. Cardiovascular: RRR, no murmurs or rubs, no peripheral edema. Tele: SB in the 50s-SR, low  voltage. Respiratory: clear to auscultation bilaterally, no wheezes, no crackles. No increased work of breathing  Abdomen: soft, nontender, nondistended, normal bowel tones heard  Skin: dry, no rashes  Musculoskeletal: no joint effusions, normal range of motion . No acute findings on right shoulder exam. Psychiatric: appropriate affect, normal speech  Neurologic: Alert and oriented to person and place. No focal neurological deficits. extraocular muscles intact, clear speech, moving all extremities with intact sensorium    Data Reviewed: CBC:  Recent Labs Lab 10/21/15 1447 10/21/15 2131 10/22/15 1048 10/23/15 0722  WBC 6.5  --  7.8 8.0  HGB 4.0* 6.0* 10.0* 9.3*  HCT 14.0* 19.7* 31.7* 29.5*  MCV 90.3  --  85.7 85.3  PLT 130*  --  99* 92*   Basic Metabolic Panel:  Recent Labs Lab 10/21/15 1447 10/22/15 1048 10/23/15 0722  NA 140 140 139  K 3.5 3.4* 3.7  CL 107 107 106  CO2 23 25 27   GLUCOSE 105* 103* 122*  BUN 15 17 15   CREATININE 1.85* 1.81* 1.65*  CALCIUM 8.1* 8.4* 8.3*   GFR: Estimated Creatinine Clearance: 31.7 mL/min (by C-G formula based on SCr of 1.65 mg/dL). Liver Function Tests:  Recent Labs Lab 10/21/15 1447  AST 31  ALT 8*  ALKPHOS 47  BILITOT 1.9*  PROT 4.5*  ALBUMIN 2.8*   No results for input(s): LIPASE, AMYLASE in the last 168 hours. No results for input(s): AMMONIA in the last 168 hours. Coagulation Profile:  Recent Labs Lab 10/21/15 1447 10/22/15 1535 10/23/15 0722  INR 2.72 2.02 2.01   Cardiac Enzymes:  Recent Labs Lab 10/21/15 1447  TROPONINI 0.04*   BNP (last 3 results) No results for input(s): PROBNP in the last 8760 hours. HbA1C: No results for input(s): HGBA1C in the last 72 hours. CBG: No results for input(s): GLUCAP in the last 168 hours. Lipid Profile: No results for input(s): CHOL, HDL, LDLCALC, TRIG, CHOLHDL, LDLDIRECT in the last 72 hours. Thyroid Function Tests: No results for input(s): TSH, T4TOTAL, FREET4,  T3FREE, THYROIDAB in the last 72 hours. Anemia Panel: No results for input(s): VITAMINB12, FOLATE, FERRITIN, TIBC, IRON, RETICCTPCT in the last 72 hours. Urine analysis:    Component Value Date/Time   COLORURINE YELLOW 10/21/2015 1742   APPEARANCEUR HAZY (A) 10/21/2015 1742   LABSPEC 1.018 10/21/2015 1742   PHURINE 5.0 10/21/2015 1742   GLUCOSEU NEGATIVE 10/21/2015 1742   HGBUR MODERATE (A) 10/21/2015 1742   BILIRUBINUR SMALL (A) 10/21/2015 1742   KETONESUR NEGATIVE 10/21/2015 1742   PROTEINUR 30 (A) 10/21/2015 1742   UROBILINOGEN 1.0 12/02/2010 1023   NITRITE NEGATIVE 10/21/2015 1742   LEUKOCYTESUR SMALL (A) 10/21/2015 1742   Sepsis Labs: @LABRCNTIP (procalcitonin:4,lacticidven:4)  ) Recent Results (from the past 240 hour(s))  MRSA PCR Screening     Status: Abnormal  Collection Time: 10/21/15  6:53 PM  Result Value Ref Range Status   MRSA by PCR POSITIVE (A) NEGATIVE Final    Comment:        The GeneXpert MRSA Assay (FDA approved for NASAL specimens only), is one component of a comprehensive MRSA colonization surveillance program. It is not intended to diagnose MRSA infection nor to guide or monitor treatment for MRSA infections. RESULT CALLED TO, READ BACK BY AND VERIFIED WITH: H RICHARD 10/21/15 @ 2133 M VESTAL       Studies: No results found.  Scheduled Meds: . [START ON 10/24/2015] Chlorhexidine Gluconate Cloth  6 each Topical Q0600  . mupirocin ointment  1 application Nasal BID  . sodium chloride flush  3 mL Intravenous Q12H  . sodium chloride flush  3 mL Intravenous Q12H  . sotalol  80 mg Oral Q12H    Continuous Infusions:     LOS: 2 days   Time spent: 23 minutes  Derinda Bartus, MD, FACP, FHM. [facility] Pager [phone]  If 7PM-7AM, please contact night-coverage [email or web address] Password [email or web address] 10/23/2015, 5:32 PM

## 2015-10-23 NOTE — Consult Note (Addendum)
[facility] PROGRESS NOTE Subjective Pt w/o further bleeding no pain. INR down but 2  Objective: Vital signs in last 24 hours: Temp:  [97.9 F (36.6 C)-99.6 F (37.6 C)] 98.1 F (36.7 C) (08/23 0828) Pulse Rate:  [54-72] 58 (08/23 0800) Resp:  [15-20] 19 (08/23 0800) BP: (103-150)/(38-64) 116/38 (08/23 0828) SpO2:  [97 %-100 %] 99 % (08/23 0800) Weight:  [85.3 kg (188 lb)] 85.3 kg (188 lb) (08/22 1259) Last BM Date: 10/21/15  Intake/Output from previous day: 08/22 0701 - 08/23 0700 In: 1451.7 [P.O.:480; I.V.:636.7; Blood:335] Out: 675 [Urine:675] Intake/Output this shift: No intake/output data recorded.  PE: General--WF alert NAD  Abdomen--soft, nontender  Lab Results:  Recent Labs  10/21/15 1447 10/21/15 2131 10/22/15 1048 10/23/15 0722  WBC 6.5  --  7.8 8.0  HGB 4.0* 6.0* 10.0* 9.3*  HCT 14.0* 19.7* 31.7* 29.5*  PLT 130*  --  99* 92*   BMET  Recent Labs  10/21/15 1447 10/22/15 1048 10/23/15 0722  NA 140 140 139  K 3.5 3.4* 3.7  CL 107 107 106  CO2 23 25 27   CREATININE 1.85* 1.81* 1.65*   LFT  Recent Labs  10/21/15 1447  PROT 4.5*  AST 31  ALT 8*  ALKPHOS 47  BILITOT 1.9*   PT/INR  Recent Labs  10/21/15 1447 10/22/15 1535 10/23/15 0722  LABPROT 29.4* 23.2* 23.0*  INR 2.72 2.02 2.01   PANCREAS No results for input(s): LIPASE in the last 72 hours.       Studies/Results: No results found.  Medications: I have reviewed the patient's current medications.  Assessment/Plan: 1. Severe anemia/+stool. Signifies chronic bleeding Will plan colon tomorrow at 9. Has history of previous colon polyps was due in 2015 for repeat colon 2. Increased INR. LFTs not bad, Eliquis only ? Cause denies warfarin. Per pharmacy Eliquis can cause INR to be abnormal but little correlation, PTT less affected. Renal dysfunction can affect. Pt with fatty liver on prior CTs Will check US liver this admission.  Plan:  Colon in AM, check PT and PTT  in AM Discussed with Pharmacy they will order PTT and heparin level to check basic level of endogenous heparin like protein.    Wilmetta Speiser JR,Keiran Gaffey L 10/23/2015, 8:29 AM  This note was created using voice recognition software. Minor errors may Have occurred unintentionally.  Pager: [phone] If no answer or after hours call [phone]

## 2015-10-24 ENCOUNTER — Encounter (HOSPITAL_COMMUNITY)
Admission: EM | Disposition: A | Discharge status: Skilled Nursing Facility | Payer: Self-pay | Source: Home / Self Care | Attending: Internal Medicine

## 2015-10-24 ENCOUNTER — Inpatient Hospital Stay (HOSPITAL_COMMUNITY): Payer: Medicare Other | Admitting: Certified Registered Nurse Anesthetist

## 2015-10-24 ENCOUNTER — Encounter (HOSPITAL_COMMUNITY): Payer: Self-pay | Admitting: Certified Registered Nurse Anesthetist

## 2015-10-24 HISTORY — PX: COLONOSCOPY WITH PROPOFOL: SHX5780

## 2015-10-24 LAB — BASIC METABOLIC PANEL
Anion gap: 5 (ref 5–15)
BUN: 7 mg/dL (ref 6–20)
CO2: 27 mmol/L (ref 22–32)
Calcium: 8.5 mg/dL — ABNORMAL LOW (ref 8.9–10.3)
Chloride: 108 mmol/L (ref 101–111)
Creatinine, Ser: 1.37 mg/dL — ABNORMAL HIGH (ref 0.44–1.00)
GFR calc Af Amer: 44 mL/min — ABNORMAL LOW (ref >60)
GFR calc non Af Amer: 38 mL/min — ABNORMAL LOW (ref >60)
Glucose, Bld: 132 mg/dL — ABNORMAL HIGH (ref 65–99)
Potassium: 3.4 mmol/L — ABNORMAL LOW (ref 3.5–5.1)
Sodium: 140 mmol/L (ref 135–145)

## 2015-10-24 LAB — CBC
HCT: 30.7 % — ABNORMAL LOW (ref 36.0–46.0)
Hemoglobin: 9.3 g/dL — ABNORMAL LOW (ref 12.0–15.0)
MCH: 26.4 pg (ref 26.0–34.0)
MCHC: 30.3 g/dL (ref 30.0–36.0)
MCV: 87.2 fL (ref 78.0–100.0)
Platelets: 90 10*3/uL — ABNORMAL LOW (ref 150–400)
RBC: 3.52 MIL/uL — ABNORMAL LOW (ref 3.87–5.11)
RDW: 19.2 % — ABNORMAL HIGH (ref 11.5–15.5)
WBC: 8.1 10*3/uL (ref 4.0–10.5)

## 2015-10-24 LAB — MAGNESIUM: Magnesium: 1.4 mg/dL — ABNORMAL LOW (ref 1.7–2.4)

## 2015-10-24 SURGERY — COLONOSCOPY
Anesthesia: Monitor Anesthesia Care

## 2015-10-24 SURGERY — COLONOSCOPY WITH PROPOFOL
Anesthesia: Monitor Anesthesia Care

## 2015-10-24 MED ORDER — LIDOCAINE HCL (PF) 2 % IJ SOLN
INTRAMUSCULAR | Status: AC
  Filled 2015-10-24: qty 10

## 2015-10-24 MED ORDER — PROPOFOL 10 MG/ML IV BOLUS
INTRAVENOUS | Status: DC | PRN
  Administered 2015-10-24 (×2): 10 mg via INTRAVENOUS

## 2015-10-24 MED ORDER — TRAMADOL HCL 50 MG PO TABS: 50.0000 mg | ORAL_TABLET | Freq: Four times a day (QID) | ORAL | Status: DC | PRN

## 2015-10-24 MED ORDER — SODIUM CHLORIDE 0.9 % IV SOLN: INTRAVENOUS | Status: DC

## 2015-10-24 MED ORDER — MOMETASONE FURO-FORMOTEROL FUM 200-5 MCG/ACT IN AERO
2.0000 | INHALATION_SPRAY | Freq: Two times a day (BID) | RESPIRATORY_TRACT | Status: DC
  Administered 2015-10-25: 2 via RESPIRATORY_TRACT
  Filled 2015-10-24: qty 8.8

## 2015-10-24 MED ORDER — POTASSIUM CHLORIDE 10 MEQ/100ML IV SOLN
10.0000 meq | INTRAVENOUS | Status: AC
  Administered 2015-10-24 (×2): 10 meq via INTRAVENOUS
  Filled 2015-10-24 (×2): qty 100

## 2015-10-24 MED ORDER — PROMETHAZINE HCL 25 MG/ML IJ SOLN: 6.2500 mg | INTRAMUSCULAR | Status: DC | PRN

## 2015-10-24 MED ORDER — POLYVINYL ALCOHOL 1.4 % OP SOLN
1.0000 [drp] | OPHTHALMIC | Status: DC | PRN
  Filled 2015-10-24: qty 15

## 2015-10-24 MED ORDER — FENTANYL CITRATE (PF) 100 MCG/2ML IJ SOLN: 25.0000 ug | INTRAMUSCULAR | Status: DC | PRN

## 2015-10-24 MED ORDER — MIDAZOLAM HCL 2 MG/2ML IJ SOLN: 0.5000 mg | Freq: Once | INTRAMUSCULAR | Status: DC | PRN

## 2015-10-24 MED ORDER — ALPRAZOLAM 0.5 MG PO TABS: 0.5000 mg | ORAL_TABLET | Freq: Two times a day (BID) | ORAL | Status: DC | PRN

## 2015-10-24 MED ORDER — SODIUM CHLORIDE 0.9 % IV SOLN
INTRAVENOUS | Status: DC | PRN
  Administered 2015-10-24: 09:00:00 via INTRAVENOUS

## 2015-10-24 MED ORDER — PROPOFOL 500 MG/50ML IV EMUL
INTRAVENOUS | Status: DC | PRN
  Administered 2015-10-24: 75 ug/kg/min via INTRAVENOUS

## 2015-10-24 MED ORDER — MEPERIDINE HCL 25 MG/ML IJ SOLN: 6.2500 mg | INTRAMUSCULAR | Status: DC | PRN

## 2015-10-24 MED ORDER — ATORVASTATIN CALCIUM 20 MG PO TABS
20.0000 mg | ORAL_TABLET | Freq: Every day | ORAL | Status: DC
  Administered 2015-10-24: 20 mg via ORAL
  Filled 2015-10-24: qty 1

## 2015-10-24 MED ORDER — POTASSIUM CHLORIDE 10 MEQ/100ML IV SOLN
10.0000 meq | INTRAVENOUS | Status: AC
  Administered 2015-10-24: 10 meq via INTRAVENOUS
  Filled 2015-10-24: qty 100

## 2015-10-24 MED ORDER — LIDOCAINE HCL 2 % IJ SOLN
INTRAMUSCULAR | Status: AC
  Filled 2015-10-24: qty 20

## 2015-10-24 NOTE — Clinical Social Work Placement (Signed)
   CLINICAL SOCIAL WORK PLACEMENT  NOTE  Date:  10/24/2015  Patient Details  Name: Roderic PalauVirginia W Ashenfelter MRN: [id number] Date of Birth: Sep 23, 1945  Clinical Social Work is seeking post-discharge placement for this patient at the Skilled  Nursing Facility level of care (*CSW will initial, date and re-position this form in  chart as items are completed):  Yes   Patient/family provided with [place] Clinical Social Work Department's list of facilities offering this level of care within the geographic area requested by the patient (or if unable, by the patient's family).  Yes   Patient/family informed of their freedom to choose among providers that offer the needed level of care, that participate in Medicare, Medicaid or managed care program needed by the patient, have an available bed and are willing to accept the patient.  Yes   Patient/family informed of [facility]'s ownership interest in [facility] and [facility], as well as of the fact that they are under no obligation to receive care at these facilities.  PASRR submitted to EDS on 10/24/15     PASRR number received on       Existing PASRR number confirmed on       FL2 transmitted to all facilities in geographic area requested by pt/family on 10/24/15     FL2 transmitted to all facilities within larger geographic area on       Patient informed that his/her managed care company has contracts with or will negotiate with certain facilities, including the following:            Patient/family informed of bed offers received.  Patient chooses bed at       Physician recommends and patient chooses bed at      Patient to be transferred to   on  .  Patient to be transferred to facility by       Patient family notified on   of transfer.  Name of family member notified:        PHYSICIAN Please sign FL2     Additional Comment:    _______________________________________________ Margarito LinerSarah C Alonni Heimsoth, LCSW 10/24/2015, 3:56  PM

## 2015-10-24 NOTE — Anesthesia Preprocedure Evaluation (Addendum)
Anesthesia Evaluation  Patient identified by MRN, date of birth, ID band Patient awake    Reviewed: Allergy & Precautions, NPO status , Patient's Chart, lab work & pertinent test results  History of Anesthesia Complications Negative for: history of anesthetic complications  Airway Mallampati: II  TM Distance: >3 FB Neck ROM: Full    Dental  (+) Edentulous Upper, Dental Advisory Given   Pulmonary asthma , COPD, former smoker,    breath sounds clear to auscultation       Cardiovascular hypertension, Pt. on medications and Pt. on home beta blockers (-) angina+ CAD, + CABG and + Peripheral Vascular Disease  + dysrhythmias Atrial Fibrillation  Rhythm:Regular Rate:Normal  '10 ECHO:  EF 45-50%   Neuro/Psych  Headaches, Anxiety Depression    GI/Hepatic (+)     substance abuse  alcohol use, Acute GI bleed   Endo/Other  Morbid obesity  Renal/GU Renal InsufficiencyRenal disease (creat 1.37)     Musculoskeletal   Abdominal (+) + obese,   Peds  Hematology  (+) Blood dyscrasia (eliquis, Hb 9.3), ,   Anesthesia Other Findings   Reproductive/Obstetrics                           Anesthesia Physical Anesthesia Plan  ASA: III  Anesthesia Plan: MAC   Post-op Pain Management:    Induction: Intravenous  Airway Management Planned: Natural Airway and Simple Face Mask  Additional Equipment:   Intra-op Plan:   Post-operative Plan:   Informed Consent: I have reviewed the patients History and Physical, chart, labs and discussed the procedure including the risks, benefits and alternatives for the proposed anesthesia with the patient or authorized representative who has indicated his/her understanding and acceptance.   Dental advisory given  Plan Discussed with: CRNA, Anesthesiologist and Surgeon  Anesthesia Plan Comments: (Plan routine monitors, MAC)       Anesthesia Quick Evaluation

## 2015-10-24 NOTE — Interval H&P Note (Signed)
History and Physical Interval Note:  10/24/2015 8:48 AM  Gabrielle [name]  has presented today for surgery, with the diagnosis of Anemia  The various methods of treatment have been discussed with the patient and family. After consideration of risks, benefits and other options for treatment, the patient has consented to  Procedure(s): COLONOSCOPY WITH PROPOFOL (N/A) as a surgical intervention .  The patient's history has been reviewed, patient examined, no change in status, stable for surgery.  I have reviewed the patient's chart and labs.  Questions were answered to the patient's satisfaction.     Navdeep Fessenden JR,Yunus Stoklosa L

## 2015-10-24 NOTE — Anesthesia Procedure Notes (Signed)
Procedure Name: MAC Date/Time: 10/24/2015 9:08 AM Performed by: Rise PatienceBELL, Revin Corker T Pre-anesthesia Checklist: Patient identified, Emergency Drugs available, Suction available and Patient being monitored Patient Re-evaluated:Patient Re-evaluated prior to inductionOxygen Delivery Method: Simple face mask Preoxygenation: Pre-oxygenation with 100% oxygen Intubation Type: IV induction Placement Confirmation: positive ETCO2 and breath sounds checked- equal and bilateral Dental Injury: Teeth and Oropharynx as per pre-operative assessment

## 2015-10-24 NOTE — Progress Notes (Signed)
PROGRESS NOTE  VELENA KEEGAN ZOX:[id number] DOB: 07/31/1945 DOA: 10/21/2015 PCP: [name], MD  HPI/Recap of past 24 hours: 70 y.o. female with medical history significant of atrial fibrillation currently anticoagulated with Eliquis, as well as history of coronary artery disease status post bypass surgery in 2010, systolic CHF, COPD, presented 8/11 to the emergency department with complaints of profound weakness. Found to have hemoglobin of 4, GI source suspected and transfused 4 units of blood. EGD unremarkable. Status post colonoscopy 8/24   Assessment/Plan: GI bleed.   - Denies NSAID use or alcohol abuse.  - In the emergency room found to have bright red blood on rectal exam.  - Had been placed on Protonix drip. Eliquis stopped.   - EGD unremarkable. - Underwent colonoscopy 8/24. Discussed with Dr. Randa Evens. Friable colonic mucosa, diverticulosis of the sigmoid colon and nonbleeding internal hemorrhoids. He recommended that anticoagulation may be resumed in 1-2 days but needs to assess choice of anticoagulation and appropriate dosing and will need to be closely monitored for bleeding. - No overt GI bleeding reported  Acute blood loss anemia/symptomatically anemia.  - She presented with generalized weakness and hemoglobin of 4.0.  - In the emergency room emergency room provider noting bright red blood on rectal exam.  - She denies history of melena or bright red blood per rectum. Denies hematemesis. Stopped anticoagulant,  - S/P 4 PRBC's. Hb improved. Monitor daily CBCs and transfuse if hemoglobin less than 7 g per DL. - Hemoglobin stable over the last 24 hours.  Thrombocytopenia - Possibly related to acute blood loss. Platelets stable in the 90 range for the last 2 days. Stable.  History of paroxysmal atrial fibrillation.  - Holding ARB and metoprolol as she presented with GI bleed and profound anemia and there is concern for hypotension. Anticoagulation discontinued.  -  Will continue Sotalol for now watch blood pressures closely. QTC 492 on 8/24.? Reduce sotalol dose. To be discussed with primary cardiologist. - Discussed extensively with patient's son and twin sister at bedside. Patient lives alone in a 2 level house. She falls frequently and as per sister, may have fallen or "dozen times" this year. Discussed extensively regarding risks and benefits of anticoagulation including stroke prevention versus catastrophic bleed from falls or GI source. Son at this time is leaning towards continuing anticoagulation. - Discussed with pharmacy on 8/24. Given recent GI bleed, severe symptomatic anemia, if decision made to resume anticoagulation may consider starting Eliquis at reduced of 2.5 mg BID.   Coronary Artery Disease, status post bypass surgery in 2010.  - She reported having chest pain at home, which likely is secondary to profound anemia. No chest pain since.   History of systolic congestive heart failure.  - From Dr Anne Fu clinic note dated 07/25/2015 patient having EF of 45-50%. Got a dose of Lasix on 8/21 and 8/22. Monitor volume status closely. Clinically seems compensated. Plan to resume Lasix possibly in a couple of days once renal function stabilizes.  Acute on chronic renal failure.  - Has history of chronic kidney disease stage III, presents with creatinine of 1.85. Suspect secondary to hypovolemia from GI bleed.  - Note we held ARB at admission. - Creatinine gradually improved to 1.3. Trend daily BMP - Baseline creatinine not known. Last creatinine in Epic is from 12/01/10:1.2  History of hypertension.  - Presenting with acute blood loss anemia and GI bleed, will hold Cozaar and metoprolol. BPs have been stable.   Hypokalemia - Replace and follow. Check  magnesium.  Prolonged QTC - QTC was 515 on admission. QTC on 8/24:492. Continue monitoring on telemetry.  DVT ppx: SCDs due to GI bleed. Code Status: FULL  Family Communication: Discussed  with patient, son and twin sister at bedside Disposition Plan: Admitted to stepdown unit. Transfer to telemetry on 8/24. DC to SNF when medically ready.   Consultants:  GI Dr. Randa EvensEdwards  Procedures:  EGD 8/22   Colonoscopy 8/24   Antimicrobials:  None   Subjective: Patient was seen post colonoscopy this morning. Denied complaints. Still sleepy. No pain or bleeding reported.   Objective: Vitals:   10/24/15 1000 10/24/15 1042 10/24/15 1046 10/24/15 1135  BP: (!) 181/67 (!) 136/47  (!) 130/52  Pulse: 68 68 67 65  Resp: (!) 23 (!) 23 20 19   Temp:  98.2 F (36.8 C)  98.5 F (36.9 C)  TempSrc:  Oral  Oral  SpO2: 94% (!) 86% 96% 100%  Weight:      Height:        Intake/Output Summary (Last 24 hours) at 10/24/15 1553 Last data filed at 10/24/15 0927  Gross per 24 hour  Intake              680 ml  Output               16 ml  Net              664 ml   Filed Weights   10/21/15 1840 10/22/15 1259  Weight: 87.4 kg (192 lb 10.9 oz) 85.3 kg (188 lb)    Exam: General:  Alert, oriented, calm, in no acute distress. Seen lying comfortably in bed. Cardiovascular: RRR, no murmurs or rubs, no peripheral edema.  Respiratory: clear to auscultation bilaterally, no wheezes, no crackles. No increased work of breathing  Abdomen: soft, nontender, nondistended, normal bowel tones heard  Skin: dry, no rashes  Musculoskeletal: no joint effusions, normal range of motion . No acute findings on right shoulder exam. Psychiatric: appropriate affect, normal speech  Neurologic: Alert and oriented to person and place. No focal neurological deficits. extraocular muscles intact, clear speech, moving all extremities with intact sensorium    Data Reviewed: CBC:  Recent Labs Lab 10/21/15 1447 10/21/15 2131 10/22/15 1048 10/23/15 0722 10/24/15 0227  WBC 6.5  --  7.8 8.0 8.1  HGB 4.0* 6.0* 10.0* 9.3* 9.3*  HCT 14.0* 19.7* 31.7* 29.5* 30.7*  MCV 90.3  --  85.7 85.3 87.2  PLT 130*  --  99* 92*  90*   Basic Metabolic Panel:  Recent Labs Lab 10/21/15 1447 10/22/15 1048 10/23/15 0722 10/24/15 0227  NA 140 140 139 140  K 3.5 3.4* 3.7 3.4*  CL 107 107 106 108  CO2 23 25 27 27   GLUCOSE 105* 103* 122* 132*  BUN 15 17 15 7   CREATININE 1.85* 1.81* 1.65* 1.37*  CALCIUM 8.1* 8.4* 8.3* 8.5*   GFR: Estimated Creatinine Clearance: 38.2 mL/min (by C-G formula based on SCr of 1.37 mg/dL). Liver Function Tests:  Recent Labs Lab 10/21/15 1447  AST 31  ALT 8*  ALKPHOS 47  BILITOT 1.9*  PROT 4.5*  ALBUMIN 2.8*   No results for input(s): LIPASE, AMYLASE in the last 168 hours. No results for input(s): AMMONIA in the last 168 hours. Coagulation Profile:  Recent Labs Lab 10/21/15 1447 10/22/15 1535 10/23/15 0722  INR 2.72 2.02 2.01   Cardiac Enzymes:  Recent Labs Lab 10/21/15 1447  TROPONINI 0.04*   BNP (last 3 results)  No results for input(s): PROBNP in the last 8760 hours. HbA1C: No results for input(s): HGBA1C in the last 72 hours. CBG: No results for input(s): GLUCAP in the last 168 hours. Lipid Profile: No results for input(s): CHOL, HDL, LDLCALC, TRIG, CHOLHDL, LDLDIRECT in the last 72 hours. Thyroid Function Tests: No results for input(s): TSH, T4TOTAL, FREET4, T3FREE, THYROIDAB in the last 72 hours. Anemia Panel: No results for input(s): VITAMINB12, FOLATE, FERRITIN, TIBC, IRON, RETICCTPCT in the last 72 hours. Urine analysis:    Component Value Date/Time   COLORURINE YELLOW 10/21/2015 1742   APPEARANCEUR HAZY (A) 10/21/2015 1742   LABSPEC 1.018 10/21/2015 1742   PHURINE 5.0 10/21/2015 1742   GLUCOSEU NEGATIVE 10/21/2015 1742   HGBUR MODERATE (A) 10/21/2015 1742   BILIRUBINUR SMALL (A) 10/21/2015 1742   KETONESUR NEGATIVE 10/21/2015 1742   PROTEINUR 30 (A) 10/21/2015 1742   UROBILINOGEN 1.0 12/02/2010 1023   NITRITE NEGATIVE 10/21/2015 1742   LEUKOCYTESUR SMALL (A) 10/21/2015 1742   Sepsis  Labs: @LABRCNTIP (procalcitonin:4,lacticidven:4)  ) Recent Results (from the past 240 hour(s))  MRSA PCR Screening     Status: Abnormal   Collection Time: 10/21/15  6:53 PM  Result Value Ref Range Status   MRSA by PCR POSITIVE (A) NEGATIVE Final    Comment:        The GeneXpert MRSA Assay (FDA approved for NASAL specimens only), is one component of a comprehensive MRSA colonization surveillance program. It is not intended to diagnose MRSA infection nor to guide or monitor treatment for MRSA infections. RESULT CALLED TO, READ BACK BY AND VERIFIED WITH: H RICHARD 10/21/15 @ 2133 M VESTAL       Studies: No results found.  Scheduled Meds: . Chlorhexidine Gluconate Cloth  6 each Topical Q0600  . mupirocin ointment  1 application Nasal BID  . sodium chloride flush  3 mL Intravenous Q12H  . sodium chloride flush  3 mL Intravenous Q12H  . sotalol  80 mg Oral Q12H    Continuous Infusions:     LOS: 3 days   Time spent: 30 minutes  Matheo Rathbone, MD, FACP, FHM. [facility] Pager [phone]  If 7PM-7AM, please contact night-coverage [email or web address] Password [email or web address] 10/24/2015, 3:53 PM

## 2015-10-24 NOTE — NC FL2 (Signed)
[place] MEDICAID FL2 LEVEL OF CARE SCREENING TOOL     IDENTIFICATION  Patient Name: Gabrielle [name] Birthdate: 1945-06-01 Sex: female Admission Date (Current Location): 10/21/2015  [facility] [place]   Facility and Address:  [facility], [address], [place], [place] [address]      Provider Number: [id number]  Attending Physician Name and Address:  Elease EtienneAnand D Hongalgi, MD  Relative Name and Phone Number:       Current Level of Care: Hospital Recommended Level of Care: Skilled Nursing Facility Prior Approval Number:    Date Approved/Denied:   PASRR Number: Pending  Discharge Plan: SNF    Current Diagnoses: Patient Active Problem List   Diagnosis Date Noted  . Acute GI bleeding 10/21/2015  . PAF (paroxysmal atrial fibrillation) (HCC) 10/21/2015  . Systolic CHF (HCC) 10/21/2015  . Severe anemia   . Symptomatic anemia   . Coronary artery disease due to lipid rich plaque 02/07/2014  . Obesity 02/07/2014  . Abdominal aortic ectasia (HCC) 09/06/2013  . Encounter for therapeutic drug monitoring 07/13/2013  . Atrial fibrillation (HCC) 12/20/2012  . Atrial fibrillation with controlled ventricular response (HCC) 11/30/2009  . NICOTINE ADDICTION 04/02/2009  . PARAPSORIASIS 12/15/2008  . Hyperlipidemia 12/14/2008  . HEART ATTACK 12/14/2008  . HEADACHE, CHRONIC 12/14/2008  . DEPRESSION 12/13/2008  . Essential hypertension 12/13/2008  . C O P D  Gold C  12/13/2008  . ARTHRITIS 12/13/2008    Orientation RESPIRATION BLADDER Height & Weight     Self, Time, Situation, Place  O2 (Nasal Canula 2 L) Continent Weight: 188 lb (85.3 kg) Height:  5\' 2"  (157.5 cm)  BEHAVIORAL SYMPTOMS/MOOD NEUROLOGICAL BOWEL NUTRITION STATUS   (None)  (None) Continent Diet (DYS 1)  AMBULATORY STATUS COMMUNICATION OF NEEDS Skin   Limited Assist Verbally Other (Comment) (MASD)                       Personal Care Assistance Level of Assistance   Bathing, Feeding, Dressing Bathing Assistance: Limited assistance Feeding assistance: Independent Dressing Assistance: Limited assistance     Functional Limitations Info  Sight, Hearing, Speech Sight Info: Adequate Hearing Info: Adequate Speech Info: Adequate    SPECIAL CARE FACTORS FREQUENCY  PT (By licensed PT), Blood pressure, OT (By licensed OT)     PT Frequency: 5 x week OT Frequency: 5 x week            Contractures Contractures Info: Not present    Additional Factors Info  Code Status, Allergies, Psychotropic Code Status Info: Full Allergies Info: Penicillins, Procaine Hcl Psychotropic Info: Depression         Current Medications (10/24/2015):  This is the current hospital active medication list Current Facility-Administered Medications  Medication Dose Route Frequency Provider Last Rate Last Dose  . 0.9 %  sodium chloride infusion  250 mL Intravenous PRN Jeralyn BennettEzequiel Zamora, MD      . acetaminophen (TYLENOL) tablet 650 mg  650 mg Oral Q6H PRN Jeralyn BennettEzequiel Zamora, MD       Or  . acetaminophen (TYLENOL) suppository 650 mg  650 mg Rectal Q6H PRN Jeralyn BennettEzequiel Zamora, MD      . Chlorhexidine Gluconate Cloth 2 % PADS 6 each  6 each Topical Q0600 Elease EtienneAnand D Hongalgi, MD   6 each at 10/24/15 [phone]  . fentaNYL (SUBLIMAZE) injection 25-50 mcg  25-50 mcg Intravenous Q5 min PRN Jairo Benarswell Jackson, MD      . midazolam (VERSED)  injection 0.5-2 mg  0.5-2 mg Intravenous Once PRN Jairo Ben, MD      . mupirocin ointment (BACTROBAN) 2 % 1 application  1 application Nasal BID Elease Etienne, MD   1 application at 10/24/15 [phone]  . ondansetron (ZOFRAN) tablet 4 mg  4 mg Oral Q6H PRN Jeralyn Bennett, MD       Or  . ondansetron (ZOFRAN) injection 4 mg  4 mg Intravenous Q6H PRN Jeralyn Bennett, MD      . oxyCODONE (Oxy IR/ROXICODONE) immediate release tablet 5 mg  5 mg Oral Q4H PRN Jeralyn Bennett, MD   5 mg at 10/23/15 1536  . promethazine (PHENERGAN) injection 6.25-12.5 mg  6.25-12.5 mg  Intravenous Q15 min PRN Jairo Ben, MD      . sodium chloride flush (NS) 0.9 % injection 3 mL  3 mL Intravenous Q12H Jeralyn Bennett, MD   3 mL at 10/21/15 2200  . sodium chloride flush (NS) 0.9 % injection 3 mL  3 mL Intravenous Q12H Jeralyn Bennett, MD   3 mL at 10/23/15 2139  . sodium chloride flush (NS) 0.9 % injection 3 mL  3 mL Intravenous PRN Jeralyn Bennett, MD      . sotalol (BETAPACE) tablet 80 mg  80 mg Oral Q12H Jeralyn Bennett, MD   80 mg at 10/23/15 2139     Discharge Medications: Please see discharge summary for a list of discharge medications.  Relevant Imaging Results:  Relevant Lab Results:   Additional Information SS#: [id number]  Margarito Liner, LCSW

## 2015-10-24 NOTE — Transfer of Care (Signed)
Immediate Anesthesia Transfer of Care Note  Patient: Gabrielle [name]  Procedure(s) Performed: Procedure(s): COLONOSCOPY WITH PROPOFOL (N/A)  Patient Location: Endoscopy Unit  Anesthesia Type:MAC  Level of Consciousness: awake, alert  and oriented  Airway & Oxygen Therapy: Patient Spontanous Breathing and Patient connected to face mask oxygen  Post-op Assessment: Report given to RN, Post -op Vital signs reviewed and stable and Patient moving all extremities X 4  Post vital signs: Reviewed and stable  Last Vitals:  Vitals:   10/24/15 0819 10/24/15 0947  BP: (!) 168/57 (!) 121/56  Pulse: 66   Resp: 18 18  Temp: 37.2 C     Last Pain:  Vitals:   10/24/15 0819  TempSrc: Oral  PainSc: 2       Patients Stated Pain Goal: 1 (10/22/15 [id number])  Complications: No apparent anesthesia complications

## 2015-10-24 NOTE — Care Management Note (Signed)
Case Management Note  Patient Details  Name: Gabrielle [name] MRN: [id number] Date of Birth: Jun 14, 1945  Subjective/Objective:     Patient lives alone, uses a cane , has insurance for medications, she sees Tammy Fulp as PCP, per pt eval today, rec SNF, CSW referral.               Action/Plan:   Expected Discharge Date:                  Expected Discharge Plan:  Skilled Nursing Facility  In-House Referral:  Clinical Social Work  Discharge planning Services  CM Consult  Post Acute Care Choice:    Choice offered to:     DME Arranged:    DME Agency:     HH Arranged:    HH Agency:     Status of Service:  Completed, signed off  If discussed at [employer] [employer] Meetings, dates discussed:    Additional Comments:  Leone Havenaylor, Nadia Viar Clinton, RN 10/24/2015, 12:26 PM

## 2015-10-24 NOTE — H&P (View-Only) (Signed)
[facility] PROGRESS NOTE Subjective Pt w/o further bleeding no pain. INR down but 2  Objective: Vital signs in last 24 hours: Temp:  [97.9 F (36.6 C)-99.6 F (37.6 C)] 98.1 F (36.7 C) (08/23 0828) Pulse Rate:  [54-72] 58 (08/23 0800) Resp:  [15-20] 19 (08/23 0800) BP: (103-150)/(38-64) 116/38 (08/23 0828) SpO2:  [97 %-100 %] 99 % (08/23 0800) Weight:  [85.3 kg (188 lb)] 85.3 kg (188 lb) (08/22 1259) Last BM Date: 10/21/15  Intake/Output from previous day: 08/22 0701 - 08/23 0700 In: 1451.7 [P.O.:480; I.V.:636.7; Blood:335] Out: 675 [Urine:675] Intake/Output this shift: No intake/output data recorded.  PE: General--WF alert NAD  Abdomen--soft, nontender  Lab Results:  Recent Labs  10/21/15 1447 10/21/15 2131 10/22/15 1048 10/23/15 0722  WBC 6.5  --  7.8 8.0  HGB 4.0* 6.0* 10.0* 9.3*  HCT 14.0* 19.7* 31.7* 29.5*  PLT 130*  --  99* 92*   BMET  Recent Labs  10/21/15 1447 10/22/15 1048 10/23/15 0722  NA 140 140 139  K 3.5 3.4* 3.7  CL 107 107 106  CO2 23 25 27  CREATININE 1.85* 1.81* 1.65*   LFT  Recent Labs  10/21/15 1447  PROT 4.5*  AST 31  ALT 8*  ALKPHOS 47  BILITOT 1.9*   PT/INR  Recent Labs  10/21/15 1447 10/22/15 1535 10/23/15 0722  LABPROT 29.4* 23.2* 23.0*  INR 2.72 2.02 2.01   PANCREAS No results for input(s): LIPASE in the last 72 hours.       Studies/Results: No results found.  Medications: I have reviewed the patient's current medications.  Assessment/Plan: 1. Severe anemia/+stool. Signifies chronic bleeding Will plan colon tomorrow at 9. Has history of previous colon polyps was due in 2015 for repeat colon 2. Increased INR. LFTs not bad, Eliquis only ? Cause denies warfarin. Per pharmacy Eliquis can cause INR to be abnormal but little correlation, PTT less affected. Renal dysfunction can affect. Pt with fatty liver on prior CTs Will check US liver this admission.  Plan:  Colon in AM, check PT and PTT  in AM Discussed with Pharmacy they will order PTT and heparin level to check basic level of endogenous heparin like protein.    Elihue Ebert JR,Jovon Streetman L 10/23/2015, 8:29 AM  This note was created using voice recognition software. Minor errors may Have occurred unintentionally.  Pager: [phone] If no answer or after hours call [phone]   

## 2015-10-24 NOTE — Progress Notes (Signed)
Patient arrived to 2W in no apparent distress  Telemetry monitor was applied and CCMD notified.  Patient oriented to unit and room to include call light and phone.  Will continue to monitor.

## 2015-10-24 NOTE — Op Note (Signed)
[facility] Patient Name: Gabrielle [name] Procedure Date : 10/24/2015 MRN: [id number] Attending MD: Tresea Mall Dr., MD Date of Birth: May 09, 1945 CSN: [id number] Age: 70 Admit Type: Inpatient Procedure:                Colonoscopy Indications:              Iron deficiency anemia secondary to chronic blood                            loss, Personal history of colonic polyps, presented                            with +stools and Hg of 4 Providers:                Fayrene Fearing L. Randa Evens Dr., MD, Sarah Monday RN, RN,                            Arlee Muslim Tech., Technician, Rise Patience, CRNA Referring MD:              Medicines:                Propofol per Anesthesia Complications:            No immediate complications. Estimated Blood Loss:     Estimated blood loss: none. Procedure:                Pre-Anesthesia Assessment:                           - Prior to the procedure, a History and Physical                            was performed, and patient medications and                            allergies were reviewed. The patient's tolerance of                            previous anesthesia was also reviewed. The risks                            and benefits of the procedure and the sedation                            options and risks were discussed with the patient.                            All questions were answered, and informed consent                            was obtained. Prior Anticoagulants: The patient has                            taken Eliquis (apixaban), last dose was 4 days  prior to procedure. ASA Grade Assessment: III - A                            patient with severe systemic disease. After                            reviewing the risks and benefits, the patient was                            deemed in satisfactory condition to undergo the                            procedure.                           After obtaining informed  consent, the colonoscope                            was passed under direct vision. Throughout the                            procedure, the patient's blood pressure, pulse, and                            oxygen saturations were monitored continuously. The                            EC-[id number] ([id number] scope was introduced through                            the anus and advanced to the the cecum, identified                            by appendiceal orifice and ileocecal valve. The                            colonoscopy was performed without difficulty. The                            patient tolerated the procedure well. The quality                            of the bowel preparation was good. The ileocecal                            valve, appendiceal orifice, and rectum were                            photographed. Scope In: 9:13:01 AM Scope Out: 9:35:14 AM Scope Withdrawal Time: 0 hours 13 minutes 4 seconds  Total Procedure Duration: 0 hours 22 minutes 13 seconds  Findings:      The perianal and digital rectal examinations were normal.      Multiple small-mouthed diverticula were found in the sigmoid colon.  There is no endoscopic evidence of mass, polyps or tumor in the entire       colon.      A diffuse area of moderately friable mucosa with contact bleeding was       found in the entire colon. there was small amount of bleeding from       suction as well as from scope trauma. Some of this could've been AVM it       was hard to tell. This was a minimal amount of bleeding. Coso appeared       endoscopically normal      Non-bleeding internal hemorrhoids were found during retroflexion. The       hemorrhoids were medium-sized and Grade I (internal hemorrhoids that do       not prolapse). Impression:               - Diverticulosis in the sigmoid colon.                           - Friability with contact bleeding in the entire                            examined colon. can't rule out  some AVMs but the                            mucosa was quite probable and bled with minimal                            irritation.                           - Non-bleeding internal hemorrhoids.                           - No specimens collected. Moderate Sedation:      MAC by anesthesia Recommendation:           - Patient has a contact number available for                            emergencies. The signs and symptoms of potential                            delayed complications were discussed with the                            patient. Return to normal activities tomorrow.                            Written discharge instructions were provided to the                            patient.                           - Resume previous diet.                           -  The patient has taken no previous anticoagulant                            or antiplatelet agents and therefore does not                            require instructions for their resumption.                           - Return patient to hospital ward for ongoing care.                           - Mechanical soft diet.                           - Continue present medications.                           - Repeat colonoscopy in 5 years for surveillance.                           - Return to GI clinic in 3 months. Procedure Code(s):        --- Professional ---                           [id number], Colonoscopy, flexible; diagnostic, including                            collection of specimen(s) by brushing or washing,                            when performed (separate procedure) Diagnosis Code(s):        --- Professional ---                           K92.2, Gastrointestinal hemorrhage, unspecified                           D50.0, Iron deficiency anemia secondary to blood                            loss (chronic)                           Z86.010, Personal history of colonic polyps                           K57.30, Diverticulosis of  large intestine without                            perforation or abscess without bleeding                           K64.0, First degree hemorrhoids CPT copyright 2016 American Medical Association. All rights reserved. The codes documented in this report are preliminary and upon coder review may  be revised  to meet current compliance requirements. Tresea Mall Dr., MD 10/24/2015 9:48:59 AM This report has been signed electronically. Number of Addenda: 0

## 2015-10-24 NOTE — Anesthesia Postprocedure Evaluation (Signed)
Anesthesia Post Note  Patient: Gabrielle [name]  Procedure(s) Performed: Procedure(s) (LRB): COLONOSCOPY WITH PROPOFOL (N/A)  Patient location during evaluation: Endoscopy Anesthesia Type: MAC Level of consciousness: awake and alert, oriented and patient cooperative Pain management: pain level controlled Vital Signs Assessment: post-procedure vital signs reviewed and stable Respiratory status: spontaneous breathing, nonlabored ventilation, patient connected to nasal cannula oxygen and respiratory function stable Cardiovascular status: blood pressure returned to baseline and stable Postop Assessment: no signs of nausea or vomiting Anesthetic complications: no    Last Vitals:  Vitals:   10/24/15 0950 10/24/15 1000  BP: (!) 153/72 (!) 181/67  Pulse: 74 68  Resp: (!) 22 (!) 23  Temp: 36.4 C     Last Pain:  Vitals:   10/24/15 0950  TempSrc: Oral  PainSc:                  Borghild Thaker,E. Safire Gordin

## 2015-10-24 NOTE — Progress Notes (Signed)
Physical Therapy Treatment Patient Details Name: Gabrielle [name] MRN: [id number] DOB: 09-18-1945 Today's Date: 10/24/2015    History of Present Illness 70 y.o. female admitted to [facility] on 10/21/15 for weakness.  Pt found to have severe anemia/(+) heme stools, s/p PRBCs and upper endoscopy (which did not reveal anything of significance).  Pt with PMHx of HTN, MI, COPD, A fib, TKA, CABG, and CAD.  s/p colonoscopy 10/24/15.     PT Comments    New information given today clarifying pt's home situation and no help available at discharge (pt reported yesterday family could provide 24/7 assist and they are now present and stating they cannot).  Family also reports that she has at least a dozen falls in the past year and would not be able to haul a RW around her multi level home.  I did speak with pt that her tri level home is not ideal yesterday.  Given this new information, I believe that pt would be safer going to SNF for rehab with transition to ALF.  Family agrees, pt is too lethargic at this time to agree to much.  MD also has concerns as pt is on Eliquis.    Follow Up Recommendations  SNF     Equipment Recommendations  Rolling walker with 5" wheels    Recommendations for Other Services   NA     Precautions / Restrictions Precautions Precautions: Fall Precaution Comments: h/o falls    Mobility  Bed Mobility Overal bed mobility: Needs Assistance Bed Mobility: Supine to Sit     Supine to sit: Min assist;HOB elevated     General bed mobility comments: Min assist to support trunk and help pt reach for bed rail to assist in pulling trunk up to sitting EOB.   Transfers Overall transfer level: Needs assistance Equipment used: Rolling walker (2 wheeled) Transfers: Sit to/from [employer] Sit to Stand: Min assist Stand pivot transfers: Min assist       General transfer comment: Min assist to support trunk during transitions.  Pt's eyes closed much of the session.   Verbal cues for safety and to indicate when she can sit.  Stand pivot to [facility] and from [facility] to recliner chair.   Ambulation/Gait Ambulation/Gait assistance: Min assist Ambulation Distance (Feet): 3 Feet Assistive device: Rolling walker (2 wheeled) Gait Pattern/deviations: Step-through pattern;Shuffle     General Gait Details: Eyes closed during short distance gait to chair.  Pt still recovering from sedation from colonoscopy.  Gait distance limited for this reason.           Balance Overall balance assessment: Needs assistance Sitting-balance support: Feet supported;Bilateral upper extremity supported Sitting balance-Leahy Scale: Fair Sitting balance - Comments: supervision EOB with bil arm prop   Standing balance support: Bilateral upper extremity supported Standing balance-Leahy Scale: Poor Standing balance comment: Needs external assist today due to lethargy.                     Cognition Arousal/Alertness: Lethargic;Suspect due to medications Behavior During Therapy: [facility] for tasks assessed/performed Overall Cognitive Status: Within Functional Limits for tasks assessed                             Pertinent Vitals/Pain Pain Assessment: No/denies pain           PT Goals (current goals can now be found in the care plan section) Acute Rehab PT Goals Patient Stated Goal: Family would  like for her to go to rehab to get stronger, and ideally, transition from SNF for rehab to ALF level of care.  Progress towards PT goals: Not progressing toward goals - comment (more lethergic today)    Frequency  Min 3X/week    PT Plan Discharge plan needs to be updated       End of Session   Activity Tolerance: Patient limited by fatigue Patient left: in chair;with call bell/phone within reach;with chair alarm set;with family/visitor present     Time: 1130-1157 PT Time Calculation (min) (ACUTE ONLY): 27 min  Charges:  $Therapeutic Activity: 23-37 mins                       Shavon Ashmore B. Chevelle Durr, PT, DPT [phone]   10/24/2015, 12:07 PM

## 2015-10-24 NOTE — Clinical Social Work Note (Signed)
Clinical Social Work Assessment  Patient Details  Name: Gabrielle [name] MRN: [id number] Date of Birth: 11-08-1945  Date of referral:  10/24/15               Reason for consult:  Facility Placement, Discharge Planning                Permission sought to share information with:  Facility Sport and exercise psychologist, Family Supports Permission granted to share information::  Yes, Verbal Permission Granted  Name::     Neurosurgeon::  SNF's  Relationship::  Sister  Contact Information:  [phone]  Housing/Transportation Living arrangements for the past 2 months:  Single Family Home Source of Information:  Medical Team, Adult Children, Other (Comment Required) (Sister) Patient Interpreter Needed:  None Criminal Activity/Legal Involvement Pertinent to Current Situation/Hospitalization:  No - Comment as needed Significant Relationships:  Adult Children, Siblings Lives with:  Self Do you feel safe going back to the place where you live?  Yes Need for family participation in patient care:  Yes (Comment)  Care giving concerns:  PT changed recommendation from HHPT to SNF due to decreased caregiver support. Patient lives home alone.   Social Worker assessment / plan:  CSW met with patient. She was asleep and did not arouse to conversation. Son and twin sister at bedside. CSW introduced role and explained that discharge planning would be discussed. Patient's sister and son agreeable to SNF and interested in ALF after that. CSW provided bed offers so far. They choose [facility]. [place] notified and will contact patient's sister to do paperwork. Patient will need PTAR. No further concerns. CSW encouraged patient's son and sister to contact CSW as needed. CSW will continue to follow patient and her family and facilitate discharge to SNF once medically stable, likely tomorrow. SNF aware.  Employment status:  Retired Nurse, adult PT Recommendations:  [place] / Referral to community resources:  [place]  Patient/Family's Response to care:  Patient asleep and did not arouse to conversation. Fluctuating orientation. Patient's family agreeable to SNF. Patient's family supportive and involved in patient's care. Patient's family appreciated social work intervention.  Patient/Family's Understanding of and Emotional Response to Diagnosis, Current Treatment, and Prognosis:  Patient asleep and did not arouse to conversation. Fluctuating orientation. Patient's family understand need for SNF. Patient's family appear happy with hospital care.  Emotional Assessment Appearance:  Appears stated age Attitude/Demeanor/Rapport:  Unable to Assess Affect (typically observed):  Unable to Assess Orientation:   (Fluctuating orientation) Alcohol / Substance use:  Tobacco Use Psych involvement (Current and /or in the community):  No (Comment)  Discharge Needs  Concerns to be addressed:  Care Coordination Readmission within the last 30 days:  No Current discharge risk:  Dependent with Mobility, Lives alone Barriers to Discharge:  No Barriers Identified   Candie Chroman, LCSW 10/24/2015, 3:52 PM

## 2015-10-25 ENCOUNTER — Encounter (HOSPITAL_COMMUNITY): Payer: Self-pay | Admitting: Gastroenterology

## 2015-10-25 DIAGNOSIS — D696 Thrombocytopenia, unspecified: Secondary | ICD-10-CM

## 2015-10-25 DIAGNOSIS — D62 Acute posthemorrhagic anemia: Secondary | ICD-10-CM

## 2015-10-25 DIAGNOSIS — I5022 Chronic systolic (congestive) heart failure: Secondary | ICD-10-CM

## 2015-10-25 LAB — TYPE AND SCREEN
ABO/RH(D): O POS
Antibody Screen: POSITIVE
DAT, IgG: NEGATIVE
Donor AG Type: NEGATIVE
Donor AG Type: NEGATIVE
Donor AG Type: NEGATIVE
Donor AG Type: NEGATIVE
Donor AG Type: NEGATIVE
PT AG Type: NEGATIVE
Unit division: 0
Unit division: 0
Unit division: 0
Unit division: 0
Unit division: 0
Unit division: 0

## 2015-10-25 LAB — CBC
HCT: 31.5 % — ABNORMAL LOW (ref 36.0–46.0)
Hemoglobin: 9.5 g/dL — ABNORMAL LOW (ref 12.0–15.0)
MCH: 26.9 pg (ref 26.0–34.0)
MCHC: 30.2 g/dL (ref 30.0–36.0)
MCV: 89.2 fL (ref 78.0–100.0)
Platelets: 76 10*3/uL — ABNORMAL LOW (ref 150–400)
RBC: 3.53 MIL/uL — ABNORMAL LOW (ref 3.87–5.11)
RDW: 19.6 % — ABNORMAL HIGH (ref 11.5–15.5)
WBC: 8 10*3/uL (ref 4.0–10.5)

## 2015-10-25 LAB — BASIC METABOLIC PANEL
Anion gap: 6 (ref 5–15)
BUN: 5 mg/dL — ABNORMAL LOW (ref 6–20)
CO2: 28 mmol/L (ref 22–32)
Calcium: 8.6 mg/dL — ABNORMAL LOW (ref 8.9–10.3)
Chloride: 109 mmol/L (ref 101–111)
Creatinine, Ser: 1.19 mg/dL — ABNORMAL HIGH (ref 0.44–1.00)
GFR calc Af Amer: 52 mL/min — ABNORMAL LOW (ref >60)
GFR calc non Af Amer: 44 mL/min — ABNORMAL LOW (ref >60)
Glucose, Bld: 115 mg/dL — ABNORMAL HIGH (ref 65–99)
Potassium: 3.4 mmol/L — ABNORMAL LOW (ref 3.5–5.1)
Sodium: 143 mmol/L (ref 135–145)

## 2015-10-25 MED ORDER — ALPRAZOLAM 0.5 MG PO TABS: 0.5000 mg | ORAL_TABLET | Freq: Two times a day (BID) | ORAL | 0 refills | Status: DC | PRN

## 2015-10-25 MED ORDER — TRAMADOL HCL 50 MG PO TABS: 50.0000 mg | ORAL_TABLET | Freq: Four times a day (QID) | ORAL | 0 refills | Status: DC | PRN

## 2015-10-25 MED ORDER — MAGNESIUM SULFATE 2 GM/50ML IV SOLN
2.0000 g | Freq: Once | INTRAVENOUS | Status: AC
  Administered 2015-10-25: 2 g via INTRAVENOUS
  Filled 2015-10-25: qty 50

## 2015-10-25 MED ORDER — POTASSIUM CHLORIDE CRYS ER 10 MEQ PO TBCR
30.0000 meq | EXTENDED_RELEASE_TABLET | ORAL | Status: AC
  Administered 2015-10-25 (×2): 30 meq via ORAL
  Filled 2015-10-25 (×2): qty 1

## 2015-10-25 MED ORDER — SOTALOL HCL 80 MG PO TABS: 80.0000 mg | ORAL_TABLET | Freq: Two times a day (BID) | ORAL | Status: DC

## 2015-10-25 NOTE — Discharge Instructions (Signed)
Anemia, Nonspecific Anemia is a condition in which the concentration of red blood cells or hemoglobin in the blood is below normal. Hemoglobin is a substance in red blood cells that carries oxygen to the tissues of the body. Anemia results in not enough oxygen reaching these tissues.  CAUSES  Common causes of anemia include:   Excessive bleeding. Bleeding may be internal or external. This includes excessive bleeding from periods (in women) or from the intestine.   Poor nutrition.   Chronic kidney, thyroid, and liver disease.  Bone marrow disorders that decrease red blood cell production.  Cancer and treatments for cancer.  HIV, AIDS, and their treatments.  Spleen problems that increase red blood cell destruction.  Blood disorders.  Excess destruction of red blood cells due to infection, medicines, and autoimmune disorders. SIGNS AND SYMPTOMS   Minor weakness.   Dizziness.   Headache.  Palpitations.   Shortness of breath, especially with exercise.   Paleness.  Cold sensitivity.  Indigestion.  Nausea.  Difficulty sleeping.  Difficulty concentrating. Symptoms may occur suddenly or they may develop slowly.  DIAGNOSIS  Additional blood tests are often needed. These help your health care provider determine the best treatment. Your health care provider will check your stool for blood and look for other causes of blood loss.  TREATMENT  Treatment varies depending on the cause of the anemia. Treatment can include:   Supplements of iron, vitamin B12, or folic acid.   Hormone medicines.   A blood transfusion. This may be needed if blood loss is severe.   Hospitalization. This may be needed if there is significant continual blood loss.   Dietary changes.  Spleen removal. HOME CARE INSTRUCTIONS Keep all follow-up appointments. It often takes many weeks to correct anemia, and having your health care provider check on your condition and your response to  treatment is very important. SEEK IMMEDIATE MEDICAL CARE IF:   You develop extreme weakness, shortness of breath, or chest pain.   You become dizzy or have trouble concentrating.  You develop heavy vaginal bleeding.   You develop a rash.   You have bloody or black, tarry stools.   You faint.   You vomit up blood.   You vomit repeatedly.   You have abdominal pain.  You have a fever or persistent symptoms for more than 2-3 days.   You have a fever and your symptoms suddenly get worse.   You are dehydrated.  MAKE SURE YOU:  Understand these instructions.  Will watch your condition.  Will get help right away if you are not doing well or get worse.   This information is not intended to replace advice given to you by your health care provider. Make sure you discuss any questions you have with your health care provider.   Document Released: 03/26/2004 Document Revised: 10/19/2012 Document Reviewed: 08/12/2012 Elsevier Interactive Patient Education 2016 Elsevier Inc.   Gastrointestinal Bleeding Gastrointestinal (GI) bleeding means there is bleeding somewhere along the digestive tract, between the mouth and anus. CAUSES  There are many different problems that can cause GI bleeding. Possible causes include:  Esophagitis. This is inflammation, irritation, or swelling of the esophagus.  Hemorrhoids.These are veins that are full of blood (engorged) in the rectum. They cause pain, inflammation, and may bleed.  Anal fissures.These are areas of painful tearing which may bleed. They are often caused by passing hard stool.  Diverticulosis.These are pouches that form on the colon over time, with age, and may bleed significantly.  Diverticulitis.This is inflammation in areas with diverticulosis. It can cause pain, fever, and bloody stools, although bleeding is rare.  Polyps and cancer. Colon cancer often starts out as precancerous polyps.  Gastritis and  ulcers.Bleeding from the upper gastrointestinal tract (near the stomach) may travel through the intestines and produce black, sometimes tarry, often bad smelling stools. In certain cases, if the bleeding is fast enough, the stools may not be black, but red. This condition may be life-threatening. SYMPTOMS   Vomiting bright red blood or material that looks like coffee grounds.  Bloody, black, or tarry stools. DIAGNOSIS  Your caregiver may diagnose your condition by taking your history and performing a physical exam. More tests may be needed, including:  X-rays and other imaging tests.  Esophagogastroduodenoscopy (EGD). This test uses a flexible, lighted tube to look at your esophagus, stomach, and small intestine.  Colonoscopy. This test uses a flexible, lighted tube to look at your colon. TREATMENT  Treatment depends on the cause of your bleeding.   For bleeding from the esophagus, stomach, small intestine, or colon, the caregiver doing your EGD or colonoscopy may be able to stop the bleeding as part of the procedure.  Inflammation or infection of the colon can be treated with medicines.  Many rectal problems can be treated with creams, suppositories, or warm baths.  Surgery is sometimes needed.  Blood transfusions are sometimes needed if you have lost a lot of blood. If bleeding is slow, you may be allowed to go home. If there is a lot of bleeding, you will need to stay in the hospital for observation. HOME CARE INSTRUCTIONS   Take any medicines exactly as prescribed.  Keep your stools soft by eating foods that are high in fiber. These foods include whole grains, legumes, fruits, and vegetables. Prunes (1 to 3 a day) work well for many people.  Drink enough fluids to keep your urine clear or pale yellow. SEEK IMMEDIATE MEDICAL CARE IF:   Your bleeding increases.  You feel lightheaded, weak, or you faint.  You have severe cramps in your back or abdomen.  You pass large  blood clots in your stool.  Your problems are getting worse. MAKE SURE YOU:   Understand these instructions.  Will watch your condition.  Will get help right away if you are not doing well or get worse.   This information is not intended to replace advice given to you by your health care provider. Make sure you discuss any questions you have with your health care provider.   Document Released: 02/14/2000 Document Revised: 02/03/2012 Document Reviewed: 08/06/2014 Elsevier Interactive Patient Education [employer]2016 Elsevier Inc.

## 2015-10-25 NOTE — Discharge Summary (Signed)
Physician Discharge Summary  Gabrielle [name] ZOX:[id number] DOB: Jul 24, 1945  PCP: Gabrielle Saupe, MD  Admit date: 10/21/2015 Discharge date: 10/25/2015  Admitted From: Home Disposition:  [facility]  Recommendations for Outpatient Follow-up:  1. M.D. at SNF in 3 days with repeat labs (CBC, BMP, magnesium & EKG). 2. Dr. Vida [name], [facility] in 2 weeks. SNF to arrange follow-up. Consideration for outpatient capsule endoscopy. 3. Dr. Donato [name], Cardiology in 1 month. SNF to arrange follow-up. Review decision regarding anticoagulation. 4. Dr. Cain [name], PCP upon discharge from SNF.  Home Health: None Equipment/Devices: None    Discharge Condition: Improved and stable  CODE STATUS: Full  Diet recommendation: Heart healthy diet  Discharge Diagnoses:  Principal Problem:   Acute GI bleeding Active Problems:   Essential hypertension   Symptomatic anemia   PAF (paroxysmal atrial fibrillation) (HCC)   Systolic CHF (HCC)   Brief/Interim Summary: 69 y.o.femalewith medical history significant of atrial fibrillation currently anticoagulated with Eliquis, as well as history of coronary artery disease status post bypass surgery in 2010, systolic CHF, COPD, presented 8/11 to the emergency department with complaints of profound weakness. Found to have hemoglobin of 4, GI source suspected and transfused 4 units of blood. EGD unremarkable. Status post colonoscopy 8/24   Assessment/Plan: GI bleed. - Denies NSAID use or alcohol abuse.  - In the emergency room found to have bright red blood on rectal exam.  - Had been placed on Protonix drip. Eliquis stopped during this admission.  - EGD unremarkable-detailed report as below. - Underwent colonoscopy 8/24. Discussed with Gabrielle [name]. Friable colonic mucosa, diverticulosis of the sigmoid colon and nonbleeding internal hemorrhoids. He recommended that anticoagulation may be resumed in 1-2 days but needs to assess choice of anticoagulation and  appropriate dosing and will need to be closely monitored for bleeding. - No overt GI bleeding reported - Discussed extensively with patient's primary cardiologist GabrielleSkains on 8/24> after reviewing risks versus benefits of anticoagulation, at this time it is felt that risks of catastrophic bleeding from history of frequent falls, GI bleeding, friable colonic mucosa and thrombocytopenia outweigh benefits of stroke prevention and we agreed to discontinue Eliquis during this admission and no initiation of antiplatelet agents either. Decision to resume anticoagulation may be reviewed during outpatient follow-up with cardiology. This was discussed today in great length with the patient and patient's sister Ms. Gabrielle [name] who verbalized understanding and are in agreement with this plan. - As discussed with Gabrielle [name], GI yesterday, outpatient follow-up with their service to consider outpatient capsule endoscopy as deemed necessary.  Acute blood loss anemia/symptomatically anemia.  - She presented with generalized weakness and hemoglobin of 4.0.  - In the emergency room emergency room provider noting bright red blood on rectal exam.  - She denies history of melena or bright red blood per rectum. Denies hematemesis. Stopped anticoagulant,  - S/P 4 PRBC's. Hb improved and stable in the 9 g per DL range - Periodically follow CBCs at SNF.  Thrombocytopenia - Possibly related to acute blood loss. No ongoing active bleeding reported. Follow CBCs at SNF in the next 3 days. May consider further evaluation if platelet counts continue to worsen or if not improving. As stated above, no anticoagulants or antiplatelets at discharge.  History of paroxysmal atrial fibrillation.  - Patient's ARB and metoprolol were held during the course of this admission due to suspected GI bleed and concern for impending hypotension. Now that her bleeding has abated and blood pressures are starting to increase,  we will resume ARB and  continue sotalol at prior dose. Discontinue metoprolol due to heart rate controlled in the 60s and avoid risk of bradycardia. - detailed anticoagulation discussion as above. Eliquis discontinued. - Discussed with Gabrielle [name] re Sotolol dose and prolonged QTC and he recommended continuing current sotalol dose since patient has maintained sinus rhythm and no arrhythmias noted. We will replace potassium and magnesium which showed help. Outpatient follow-up of BMP, magnesium and EKG 4 QTC.  Coronary Artery Disease, status post bypass surgery in 2010.  - She reported having chest pain at home, which likely issecondary to profound anemia. No chest pain since.   History of systolic congestive heart failure.  - From Dr Anne [name] clinic note dated 07/25/2015 patient having EFof 45-50%.  - ARB had been temporarily held in the hospital secondary to acute kidney injury. Renal function has normalized. Resume Lasix and ARB at discharge. Clinically compensated.  Acute on chronic renal failure.  - Has history of chronic kidney disease stage III, presents with creatinine of 1.85. Suspect secondary to hypovolemia from GI bleed.  - Baseline creatinine not known. Last creatinine in Epic is from 12/01/10:1.2 - ARB and Lasix had been temporarily held. Patient was transfused 4 units of PRBC for profound anemia. Creatinine has normalized. Follow BMP closely as outpatient while Lasix and ARB are being resumed.  History of hypertension.  - Sotalol was continued but ARB and metoprolol had been held due to concern for hypotension given GI bleed and anemia. Blood pressures are starting to increase. Resume ARB. Discontinue metoprolol due to concern for bradycardia.   Hypokalemia - Aggressively replaced prior to discharge. Follow BMP, magnesium in 3 days at [facility].  Prolonged QTC - QTC was 515 on admission. QTC on 8/24:492. Continue monitoring on telemetry.  Anxiety and depression - Continue when necessary Xanax. Celexa  was held during the course of this admission and we'll not be resumed at discharge due to concern for prolonged QTC and platelet dysfunction. May consider alternate medication during follow-up at SNF.  Patient case was discussed in detail with her twin sister Ms. Gabrielle [name]. Updated care and answered questions.  Consultants:  GI Gabrielle [name]  Procedures:  EGD 8/22: Impression:               - Normal esophagus.                           - Normal stomach.                           - Normal examined duodenum.                           - No specimens collected.                           - Blood in stool without cause found on endoscopic                            exam   Colonoscopy 8/24:  Impression:               - Diverticulosis in the sigmoid colon.                           -  Friability with contact bleeding in the entire                            examined colon. can't rule out some AVMs but the                            mucosa was quite friable and bled with minimal                            irritation.                           - Non-bleeding internal hemorrhoids.                           - No specimens collected.   Discharge Instructions  Discharge Instructions    (HEART FAILURE PATIENTS) Call MD:  Anytime you have any of the following symptoms: 1) 3 pound weight gain in 24 hours or 5 pounds in 1 week 2) shortness of breath, with or without a dry hacking cough 3) swelling in the hands, feet or stomach 4) if you have to sleep on extra pillows at night in order to breathe.    Complete by:  As directed   Call MD for:    Complete by:  As directed   Black stools or blood in stools. Vomiting blood or coffee colored material.   Call MD for:  extreme fatigue    Complete by:  As directed   Call MD for:  persistant dizziness or light-headedness    Complete by:  As directed   Diet - low sodium heart healthy    Complete by:  As directed   Increase activity slowly    Complete by:  As  directed       Medication List    STOP taking these medications   apixaban 5 MG Tabs tablet Commonly known as:  ELIQUIS   citalopram 20 MG tablet Commonly known as:  CELEXA   metoprolol succinate 25 MG 24 hr tablet Commonly known as:  TOPROL-XL     TAKE these medications   albuterol 108 (90 Base) MCG/ACT inhaler Commonly known as:  PROAIR HFA INHALE 2 PUFFS INTO THE LUNGS EVERY 6 (SIX) HOURS AS NEEDED.   ALPRAZolam 0.5 MG tablet Commonly known as:  XANAX Take 1 tablet (0.5 mg total) by mouth 2 (two) times daily as needed for anxiety.   atorvastatin 20 MG tablet Commonly known as:  LIPITOR TAKE 1 TABLET (20 MG TOTAL) BY MOUTH DAILY.   budesonide-formoterol 160-4.5 MCG/ACT inhaler Commonly known as:  SYMBICORT Inhale 2 puffs into the lungs 2 (two) times daily. What changed:  when to take this  reasons to take this   clobetasol cream 0.05 % Commonly known as:  TEMOVATE Apply 1 application topically as needed (for skin).   furosemide 20 MG tablet Commonly known as:  LASIX Take 1 tablet (20 mg total) by mouth daily. May take additional 20 mg a day for swelling as needed   losartan 50 MG tablet Commonly known as:  COZAAR Take 1 tablet (50 mg total) by mouth daily.   oxymetazoline 0.05 % nasal spray Commonly known as:  AFRIN Place 2 sprays into the nose as needed for congestion.   pantoprazole 40 MG tablet  Commonly known as:  PROTONIX Take 40 mg by mouth daily.   sotalol 80 MG tablet Commonly known as:  BETAPACE Take 1 tablet (80 mg total) by mouth 2 (two) times daily. What changed:  See the new instructions.   SYSTANE ULTRA 0.4-0.3 % Soln Generic drug:  Polyethyl Glycol-Propyl Glycol Place 1 drop into both eyes as needed (for dry eyes).   tiZANidine 4 MG tablet Commonly known as:  ZANAFLEX Take 4 mg by mouth at bedtime as needed for muscle spasms.   traMADol 50 MG tablet Commonly known as:  ULTRAM Take 1 tablet (50 mg total) by mouth every 6 (six)  hours as needed for moderate pain.      Follow-up Information    MD at SNF. Schedule an appointment as soon as possible for a visit in 3 day(s).   Why:  To be seen with repeat labs (CBC, BMP, Mg & EKG).       MAGOD,MARC E, MD. Schedule an appointment as soon as possible for a visit in 2 week(s).   Specialty:  Gastroenterology Contact information: [address] [place] [address] [phone]        Gabrielle SchultzMark Skains, MD. Schedule an appointment as soon as possible for a visit in 1 month(s).   Specialty:  Cardiology Contact information: [address] [place] [address] [phone]        Gabrielle SaupeFULP, CAMMIE, MD. Schedule an appointment as soon as possible for a visit today.   Specialty:  Family Medicine Why:  Upon discharge from SNF. Contact information: [address] [place] [address] [phone]          Allergies  Allergen Reactions  . Penicillins Hives  . Procaine Hcl Palpitations     Subjective: Denies complaints. No bleeding reported. Denies dyspnea, cough, chest pain or palpitations. As per RN, no acute issues reported.  Discharge Exam:  Vitals:   10/24/15 1631 10/24/15 1800 10/24/15 2005 10/25/15 0618  BP: (!) 133/48 (!) 138/39 (!) 140/46 (!) 152/48  Pulse: 70 68 74 72  Resp: 19 18 18 18   Temp: 98.9 F (37.2 C) 98.2 F (36.8 C) 98.2 F (36.8 C) 98.1 F (36.7 C)  TempSrc: Axillary Oral Oral Oral  SpO2: 100% 93% 94% 93%  Weight:      Height:         General:  Alert, oriented, calm, in no acute distress. Seen lying comfortably in bed.  Cardiovascular: RRR, no murmurs or rubs, no peripheral edema. Telemetry: Sinus rhythm in the 60s mostly.  Respiratory: clear to auscultation bilaterally, no wheezes, no crackles. No increased work of breathing   Abdomen: soft, nontender, nondistended, normal bowel tones heard   Skin: dry, no rashes   Musculoskeletal: no joint effusions, normal range of motion . No  acute findings on right shoulder exam.  Psychiatric: appropriate affect, normal speech   Neurologic: Alert and oriented to person and place. No focal neurological deficits. extraocular muscles intact, clear speech, moving all extremities with intact sensorium     The results of significant diagnostics from this hospitalization (including imaging, microbiology, ancillary and laboratory) are listed below for reference.     Microbiology: Recent Results (from the past 240 hour(s))  MRSA PCR Screening     Status: Abnormal   Collection Time: 10/21/15  6:53 PM  Result Value Ref Range Status   MRSA by PCR POSITIVE (A) NEGATIVE Final    Comment:  The GeneXpert MRSA Assay (FDA approved for NASAL specimens only), is one component of a comprehensive MRSA colonization surveillance program. It is not intended to diagnose MRSA infection nor to guide or monitor treatment for MRSA infections. RESULT CALLED TO, READ BACK BY AND VERIFIED WITH: H RICHARD 10/21/15 @ 2133 M VESTAL      Labs: BNP (last 3 results) No results for input(s): BNP in the last 8760 hours. Basic Metabolic Panel:  Recent Labs Lab 10/21/15 1447 10/22/15 1048 10/23/15 0722 10/24/15 0227 10/25/15 0226  NA 140 140 139 140 143  K 3.5 3.4* 3.7 3.4* 3.4*  CL 107 107 106 108 109  CO2 23 25 27 27 28   GLUCOSE 105* 103* 122* 132* 115*  BUN 15 17 15 7  5*  CREATININE 1.85* 1.81* 1.65* 1.37* 1.19*  CALCIUM 8.1* 8.4* 8.3* 8.5* 8.6*  MG  --   --   --  1.4*  --    Liver Function Tests:  Recent Labs Lab 10/21/15 1447  AST 31  ALT 8*  ALKPHOS 47  BILITOT 1.9*  PROT 4.5*  ALBUMIN 2.8*   No results for input(s): LIPASE, AMYLASE in the last 168 hours. No results for input(s): AMMONIA in the last 168 hours. CBC:  Recent Labs Lab 10/21/15 1447 10/21/15 2131 10/22/15 1048 10/23/15 0722 10/24/15 0227 10/25/15 0226  WBC 6.5  --  7.8 8.0 8.1 8.0  HGB 4.0* 6.0* 10.0* 9.3* 9.3* 9.5*  HCT 14.0* 19.7* 31.7*  29.5* 30.7* 31.5*  MCV 90.3  --  85.7 85.3 87.2 89.2  PLT 130*  --  99* 92* 90* 76*   Cardiac Enzymes:  Recent Labs Lab 10/21/15 1447  TROPONINI 0.04*   BNP: Invalid input(s): POCBNP CBG: No results for input(s): GLUCAP in the last 168 hours. D-Dimer No results for input(s): DDIMER in the last 72 hours. Hgb A1c No results for input(s): HGBA1C in the last 72 hours. Lipid Profile No results for input(s): CHOL, HDL, LDLCALC, TRIG, CHOLHDL, LDLDIRECT in the last 72 hours. Thyroid function studies No results for input(s): TSH, T4TOTAL, T3FREE, THYROIDAB in the last 72 hours.  Invalid input(s): FREET3 Anemia work up No results for input(s): VITAMINB12, FOLATE, FERRITIN, TIBC, IRON, RETICCTPCT in the last 72 hours. Urinalysis    Component Value Date/Time   COLORURINE YELLOW 10/21/2015 1742   APPEARANCEUR HAZY (A) 10/21/2015 1742   LABSPEC 1.018 10/21/2015 1742   PHURINE 5.0 10/21/2015 1742   GLUCOSEU NEGATIVE 10/21/2015 1742   HGBUR MODERATE (A) 10/21/2015 1742   BILIRUBINUR SMALL (A) 10/21/2015 1742   KETONESUR NEGATIVE 10/21/2015 1742   PROTEINUR 30 (A) 10/21/2015 1742   UROBILINOGEN 1.0 12/02/2010 1023   NITRITE NEGATIVE 10/21/2015 1742   LEUKOCYTESUR SMALL (A) 10/21/2015 1742   Sepsis Labs Invalid input(s): PROCALCITONIN,  WBC,  LACTICIDVEN    Time coordinating discharge: Over 30 minutes  SIGNED:  Marcellus Scott, MD, FACP, FHM. [facility] Pager [phone]  If 7PM-7AM, please contact night-coverage [email or web address] Password [facility] 10/25/2015, 12:07 PM

## 2015-10-25 NOTE — Clinical Social Work Placement (Signed)
   CLINICAL SOCIAL WORK PLACEMENT  NOTE  Date:  10/25/2015  Patient Details  Name: Gabrielle [name] MRN: [id number] Date of Birth: 1945/05/11  Clinical Social Work is seeking post-discharge placement for this patient at the Skilled  Nursing Facility level of care (*CSW will initial, date and re-position this form in  chart as items are completed):  Yes   Patient/family provided with [facility] Clinical Social Work Department's list of facilities offering this level of care within the geographic area requested by the patient (or if unable, by the patient's family).  Yes   Patient/family informed of their freedom to choose among providers that offer the needed level of care, that participate in Medicare, Medicaid or managed care program needed by the patient, have an available bed and are willing to accept the patient.  Yes   Patient/family informed of [facility]'s ownership interest in [facility] and [facility], as well as of the fact that they are under no obligation to receive care at these facilities.  PASRR submitted to EDS on 10/24/15     PASRR number received on       Existing PASRR number confirmed on       FL2 transmitted to all facilities in geographic area requested by pt/family on 10/24/15     FL2 transmitted to all facilities within larger geographic area on       Patient informed that his/her managed care company has contracts with or will negotiate with certain facilities, including the following:        Yes   Patient/family informed of bed offers received.  [facility]     Physician recommends and patient chooses bed at      [facility] 10[facility]25/15.  Patient to be transferred to facility by Ambulance     Patient family notified on 10/25/15 of transfer.  Name of family member notified:  Wise,[name]     PHYSICIAN Please sign FL2, Please prepare priority discharge summary, including medications,  Please prepare prescriptions     Additional Comment:  Per MD patient is ready to [facility]  . RN, patient, patient's family, and facility notified of discharge. RN given phone number for report and transport packet is on patient's chart. Ambulance transport requested. CSW signing off.   _______________________________________________ Reggy EyeLaShonda A Thong Feeny, LCSW 10/25/2015, 3:15 PM

## 2015-10-25 NOTE — Progress Notes (Signed)
Report called to Tanya at [facility]. 

## 2015-10-25 NOTE — Progress Notes (Signed)
Order received to discharge patient.  Patient expresses readiness to discharge.  Telemetry monitor was removed and CCMD notified.  Patient transferred to aftercare facility without difficulty.

## 2015-10-27 ENCOUNTER — Inpatient Hospital Stay (HOSPITAL_COMMUNITY)
Admission: EM | Admit: 2015-10-27 | Discharge: 2015-10-30 | DRG: 378 | Disposition: A | Discharge status: Skilled Nursing Facility | Payer: Medicare Other | Attending: Family Medicine | Admitting: Family Medicine

## 2015-10-27 ENCOUNTER — Encounter (HOSPITAL_COMMUNITY): Payer: Self-pay | Admitting: *Deleted

## 2015-10-27 DIAGNOSIS — J441 Chronic obstructive pulmonary disease with (acute) exacerbation: Secondary | ICD-10-CM | POA: Diagnosis present

## 2015-10-27 DIAGNOSIS — I5022 Chronic systolic (congestive) heart failure: Secondary | ICD-10-CM | POA: Diagnosis present

## 2015-10-27 DIAGNOSIS — I110 Hypertensive heart disease with heart failure: Secondary | ICD-10-CM | POA: Diagnosis present

## 2015-10-27 DIAGNOSIS — I252 Old myocardial infarction: Secondary | ICD-10-CM

## 2015-10-27 DIAGNOSIS — Z96653 Presence of artificial knee joint, bilateral: Secondary | ICD-10-CM | POA: Diagnosis present

## 2015-10-27 DIAGNOSIS — Z8249 Family history of ischemic heart disease and other diseases of the circulatory system: Secondary | ICD-10-CM

## 2015-10-27 DIAGNOSIS — R7989 Other specified abnormal findings of blood chemistry: Secondary | ICD-10-CM

## 2015-10-27 DIAGNOSIS — E872 Acidosis: Secondary | ICD-10-CM | POA: Diagnosis not present

## 2015-10-27 DIAGNOSIS — Z888 Allergy status to other drugs, medicaments and biological substances status: Secondary | ICD-10-CM

## 2015-10-27 DIAGNOSIS — D62 Acute posthemorrhagic anemia: Secondary | ICD-10-CM | POA: Diagnosis present

## 2015-10-27 DIAGNOSIS — Z7951 Long term (current) use of inhaled steroids: Secondary | ICD-10-CM

## 2015-10-27 DIAGNOSIS — E785 Hyperlipidemia, unspecified: Secondary | ICD-10-CM | POA: Diagnosis present

## 2015-10-27 DIAGNOSIS — Z5181 Encounter for therapeutic drug level monitoring: Secondary | ICD-10-CM

## 2015-10-27 DIAGNOSIS — K5731 Diverticulosis of large intestine without perforation or abscess with bleeding: Secondary | ICD-10-CM | POA: Diagnosis present

## 2015-10-27 DIAGNOSIS — E663 Overweight: Secondary | ICD-10-CM | POA: Diagnosis present

## 2015-10-27 DIAGNOSIS — I959 Hypotension, unspecified: Secondary | ICD-10-CM

## 2015-10-27 DIAGNOSIS — I4891 Unspecified atrial fibrillation: Secondary | ICD-10-CM | POA: Diagnosis present

## 2015-10-27 DIAGNOSIS — I2510 Atherosclerotic heart disease of native coronary artery without angina pectoris: Secondary | ICD-10-CM | POA: Diagnosis present

## 2015-10-27 DIAGNOSIS — Z79899 Other long term (current) drug therapy: Secondary | ICD-10-CM

## 2015-10-27 DIAGNOSIS — Z7189 Other specified counseling: Secondary | ICD-10-CM

## 2015-10-27 DIAGNOSIS — I5020 Unspecified systolic (congestive) heart failure: Secondary | ICD-10-CM

## 2015-10-27 DIAGNOSIS — Z7901 Long term (current) use of anticoagulants: Secondary | ICD-10-CM | POA: Diagnosis not present

## 2015-10-27 DIAGNOSIS — Z6832 Body mass index (BMI) 32.0-32.9, adult: Secondary | ICD-10-CM | POA: Diagnosis not present

## 2015-10-27 DIAGNOSIS — D649 Anemia, unspecified: Secondary | ICD-10-CM

## 2015-10-27 DIAGNOSIS — Z87891 Personal history of nicotine dependence: Secondary | ICD-10-CM | POA: Diagnosis not present

## 2015-10-27 DIAGNOSIS — F419 Anxiety disorder, unspecified: Secondary | ICD-10-CM | POA: Diagnosis present

## 2015-10-27 DIAGNOSIS — K219 Gastro-esophageal reflux disease without esophagitis: Secondary | ICD-10-CM | POA: Diagnosis present

## 2015-10-27 DIAGNOSIS — K922 Gastrointestinal hemorrhage, unspecified: Secondary | ICD-10-CM | POA: Diagnosis present

## 2015-10-27 DIAGNOSIS — K640 First degree hemorrhoids: Secondary | ICD-10-CM | POA: Diagnosis present

## 2015-10-27 DIAGNOSIS — L409 Psoriasis, unspecified: Secondary | ICD-10-CM | POA: Diagnosis present

## 2015-10-27 DIAGNOSIS — Z880 Allergy status to penicillin: Secondary | ICD-10-CM

## 2015-10-27 DIAGNOSIS — Z951 Presence of aortocoronary bypass graft: Secondary | ICD-10-CM | POA: Diagnosis not present

## 2015-10-27 DIAGNOSIS — J449 Chronic obstructive pulmonary disease, unspecified: Secondary | ICD-10-CM | POA: Diagnosis present

## 2015-10-27 DIAGNOSIS — F329 Major depressive disorder, single episode, unspecified: Secondary | ICD-10-CM | POA: Diagnosis present

## 2015-10-27 DIAGNOSIS — I10 Essential (primary) hypertension: Secondary | ICD-10-CM | POA: Diagnosis not present

## 2015-10-27 HISTORY — DX: Acute posthemorrhagic anemia: D62

## 2015-10-27 LAB — COMPREHENSIVE METABOLIC PANEL
ALT: 10 U/L — ABNORMAL LOW (ref 14–54)
AST: 32 U/L (ref 15–41)
Albumin: 2.5 g/dL — ABNORMAL LOW (ref 3.5–5.0)
Alkaline Phosphatase: 56 U/L (ref 38–126)
Anion gap: 4 — ABNORMAL LOW (ref 5–15)
BUN: <5 mg/dL — ABNORMAL LOW (ref 6–20)
CO2: 27 mmol/L (ref 22–32)
Calcium: 8.3 mg/dL — ABNORMAL LOW (ref 8.9–10.3)
Chloride: 112 mmol/L — ABNORMAL HIGH (ref 101–111)
Creatinine, Ser: 1.07 mg/dL — ABNORMAL HIGH (ref 0.44–1.00)
GFR calc Af Amer: 59 mL/min — ABNORMAL LOW (ref >60)
GFR calc non Af Amer: 51 mL/min — ABNORMAL LOW (ref >60)
Glucose, Bld: 118 mg/dL — ABNORMAL HIGH (ref 65–99)
Potassium: 3.7 mmol/L (ref 3.5–5.1)
Sodium: 143 mmol/L (ref 135–145)
Total Bilirubin: 3 mg/dL — ABNORMAL HIGH (ref 0.3–1.2)
Total Protein: 4.3 g/dL — ABNORMAL LOW (ref 6.5–8.1)

## 2015-10-27 LAB — I-STAT CHEM 8, ED
BUN: <3 mg/dL — ABNORMAL LOW (ref 6–20)
Calcium, Ion: 1.04 mmol/L — ABNORMAL LOW (ref 1.12–1.23)
Chloride: 106 mmol/L (ref 101–111)
Creatinine, Ser: 1 mg/dL (ref 0.44–1.00)
Glucose, Bld: 111 mg/dL — ABNORMAL HIGH (ref 65–99)
HCT: 25 % — ABNORMAL LOW (ref 36.0–46.0)
Hemoglobin: 8.5 g/dL — ABNORMAL LOW (ref 12.0–15.0)
Potassium: 3.6 mmol/L (ref 3.5–5.1)
Sodium: 143 mmol/L (ref 135–145)
TCO2: 23 mmol/L (ref 0–100)

## 2015-10-27 LAB — CBC WITH DIFFERENTIAL/PLATELET
Basophils Absolute: 0.1 10*3/uL (ref 0.0–0.1)
Basophils Relative: 1 %
Eosinophils Absolute: 0.2 10*3/uL (ref 0.0–0.7)
Eosinophils Relative: 3 %
HCT: 28.6 % — ABNORMAL LOW (ref 36.0–46.0)
Hemoglobin: 8.5 g/dL — ABNORMAL LOW (ref 12.0–15.0)
Lymphocytes Relative: 23 %
Lymphs Abs: 1.4 10*3/uL (ref 0.7–4.0)
MCH: 27.2 pg (ref 26.0–34.0)
MCHC: 29.7 g/dL — ABNORMAL LOW (ref 30.0–36.0)
MCV: 91.4 fL (ref 78.0–100.0)
Monocytes Absolute: 0.7 10*3/uL (ref 0.1–1.0)
Monocytes Relative: 12 %
Neutro Abs: 3.8 10*3/uL (ref 1.7–7.7)
Neutrophils Relative %: 61 %
Platelets: 76 10*3/uL — ABNORMAL LOW (ref 150–400)
RBC: 3.13 MIL/uL — ABNORMAL LOW (ref 3.87–5.11)
RDW: 21.6 % — ABNORMAL HIGH (ref 11.5–15.5)
WBC: 6.2 10*3/uL (ref 4.0–10.5)

## 2015-10-27 LAB — CBC
HCT: 35.2 % — ABNORMAL LOW (ref 36.0–46.0)
Hemoglobin: 10.8 g/dL — ABNORMAL LOW (ref 12.0–15.0)
MCH: 27.2 pg (ref 26.0–34.0)
MCHC: 30.7 g/dL (ref 30.0–36.0)
MCV: 88.7 fL (ref 78.0–100.0)
Platelets: 69 10*3/uL — ABNORMAL LOW (ref 150–400)
RBC: 3.97 MIL/uL (ref 3.87–5.11)
RDW: 20.5 % — ABNORMAL HIGH (ref 11.5–15.5)
WBC: 7.1 10*3/uL (ref 4.0–10.5)

## 2015-10-27 LAB — MRSA PCR SCREENING: MRSA by PCR: POSITIVE — AB

## 2015-10-27 LAB — I-STAT CG4 LACTIC ACID, ED: Lactic Acid, Venous: 2.56 mmol/L (ref 0.5–1.9)

## 2015-10-27 LAB — LACTIC ACID, PLASMA
Lactic Acid, Venous: 1.4 mmol/L (ref 0.5–1.9)
Lactic Acid, Venous: 1.8 mmol/L (ref 0.5–1.9)

## 2015-10-27 LAB — PREPARE RBC (CROSSMATCH)

## 2015-10-27 MED ORDER — ONDANSETRON HCL 4 MG PO TABS: 4.0000 mg | ORAL_TABLET | Freq: Four times a day (QID) | ORAL | Status: DC | PRN

## 2015-10-27 MED ORDER — POLYVINYL ALCOHOL 1.4 % OP SOLN
1.0000 [drp] | OPHTHALMIC | Status: DC | PRN
  Filled 2015-10-27: qty 15

## 2015-10-27 MED ORDER — ONDANSETRON HCL 4 MG/2ML IJ SOLN
4.0000 mg | Freq: Four times a day (QID) | INTRAMUSCULAR | Status: DC | PRN
  Filled 2015-10-27: qty 2

## 2015-10-27 MED ORDER — FENTANYL CITRATE (PF) 100 MCG/2ML IJ SOLN
25.0000 ug | Freq: Once | INTRAMUSCULAR | Status: AC
  Administered 2015-10-27: 25 ug via INTRAVENOUS
  Filled 2015-10-27: qty 2

## 2015-10-27 MED ORDER — ALBUTEROL SULFATE (2.5 MG/3ML) 0.083% IN NEBU: 2.5000 mg | INHALATION_SOLUTION | RESPIRATORY_TRACT | Status: DC | PRN

## 2015-10-27 MED ORDER — SODIUM CHLORIDE 0.9 % IV SOLN
INTRAVENOUS | Status: AC
  Administered 2015-10-27: 22:00:00 via INTRAVENOUS

## 2015-10-27 MED ORDER — POLYETHYLENE GLYCOL 3350 17 G PO PACK: 17.0000 g | PACK | Freq: Every day | ORAL | Status: DC | PRN

## 2015-10-27 MED ORDER — ACETAMINOPHEN 325 MG PO TABS: 650.0000 mg | ORAL_TABLET | Freq: Four times a day (QID) | ORAL | Status: DC | PRN

## 2015-10-27 MED ORDER — SODIUM CHLORIDE 0.9 % IV BOLUS (SEPSIS)
500.0000 mL | Freq: Once | INTRAVENOUS | Status: AC
  Administered 2015-10-27: 500 mL via INTRAVENOUS

## 2015-10-27 MED ORDER — MOMETASONE FURO-FORMOTEROL FUM 200-5 MCG/ACT IN AERO
2.0000 | INHALATION_SPRAY | Freq: Two times a day (BID) | RESPIRATORY_TRACT | Status: DC
Start: 2015-10-27 — End: 2015-10-30
  Administered 2015-10-28 – 2015-10-30 (×5): 2 via RESPIRATORY_TRACT
  Filled 2015-10-27: qty 8.8

## 2015-10-27 MED ORDER — SODIUM CHLORIDE 0.9 % IV SOLN: INTRAVENOUS | Status: DC

## 2015-10-27 MED ORDER — MUPIROCIN 2 % EX OINT
1.0000 "application " | TOPICAL_OINTMENT | Freq: Two times a day (BID) | CUTANEOUS | Status: DC
  Administered 2015-10-28 – 2015-10-30 (×4): 1 via NASAL
  Filled 2015-10-27: qty 22

## 2015-10-27 MED ORDER — PANTOPRAZOLE SODIUM 40 MG PO TBEC
40.0000 mg | DELAYED_RELEASE_TABLET | Freq: Every day | ORAL | Status: DC
  Administered 2015-10-28 – 2015-10-30 (×3): 40 mg via ORAL
  Filled 2015-10-27 (×3): qty 1

## 2015-10-27 MED ORDER — SODIUM CHLORIDE 0.9 % IV SOLN
Freq: Once | INTRAVENOUS | Status: AC
  Administered 2015-10-27: 14:00:00 via INTRAVENOUS

## 2015-10-27 MED ORDER — SOTALOL HCL 80 MG PO TABS
80.0000 mg | ORAL_TABLET | Freq: Two times a day (BID) | ORAL | Status: DC
  Administered 2015-10-27 – 2015-10-30 (×6): 80 mg via ORAL
  Filled 2015-10-27 (×7): qty 1

## 2015-10-27 MED ORDER — SODIUM CHLORIDE 0.9 % IV BOLUS (SEPSIS)
1000.0000 mL | Freq: Once | INTRAVENOUS | Status: AC
  Administered 2015-10-27: 1000 mL via INTRAVENOUS

## 2015-10-27 MED ORDER — SODIUM CHLORIDE 0.9% FLUSH
3.0000 mL | Freq: Two times a day (BID) | INTRAVENOUS | Status: DC
  Administered 2015-10-27 – 2015-10-30 (×5): 3 mL via INTRAVENOUS

## 2015-10-27 MED ORDER — SODIUM CHLORIDE 0.9 % IV BOLUS (SEPSIS): 500.0000 mL | Freq: Once | INTRAVENOUS | Status: DC

## 2015-10-27 MED ORDER — TRAMADOL HCL 50 MG PO TABS
50.0000 mg | ORAL_TABLET | Freq: Four times a day (QID) | ORAL | Status: DC | PRN
Start: 2015-10-27 — End: 2015-10-30
  Administered 2015-10-28 – 2015-10-30 (×7): 50 mg via ORAL
  Filled 2015-10-27 (×7): qty 1

## 2015-10-27 MED ORDER — TIZANIDINE HCL 4 MG PO TABS
4.0000 mg | ORAL_TABLET | Freq: Every evening | ORAL | Status: DC | PRN
  Filled 2015-10-27: qty 1

## 2015-10-27 MED ORDER — POLYETHYL GLYCOL-PROPYL GLYCOL 0.4-0.3 % OP SOLN: 1.0000 [drp] | OPHTHALMIC | Status: DC | PRN

## 2015-10-27 MED ORDER — ALBUTEROL SULFATE HFA 108 (90 BASE) MCG/ACT IN AERS: 2.0000 | INHALATION_SPRAY | RESPIRATORY_TRACT | Status: DC | PRN

## 2015-10-27 MED ORDER — CHLORHEXIDINE GLUCONATE CLOTH 2 % EX PADS
6.0000 | MEDICATED_PAD | Freq: Every day | CUTANEOUS | Status: DC
  Administered 2015-10-29 – 2015-10-30 (×2): 6 via TOPICAL

## 2015-10-27 MED ORDER — ALPRAZOLAM 0.5 MG PO TABS
0.5000 mg | ORAL_TABLET | Freq: Two times a day (BID) | ORAL | Status: DC | PRN
  Administered 2015-10-27 – 2015-10-29 (×3): 0.5 mg via ORAL
  Filled 2015-10-27 (×3): qty 1

## 2015-10-27 MED ORDER — OXYMETAZOLINE HCL 0.05 % NA SOLN: 2.0000 | NASAL | Status: DC | PRN

## 2015-10-27 MED ORDER — TRAZODONE HCL 50 MG PO TABS
25.0000 mg | ORAL_TABLET | Freq: Every evening | ORAL | Status: DC | PRN
  Administered 2015-10-27 – 2015-10-29 (×3): 25 mg via ORAL
  Filled 2015-10-27 (×3): qty 1

## 2015-10-27 MED ORDER — BISACODYL 5 MG PO TBEC: 5.0000 mg | DELAYED_RELEASE_TABLET | Freq: Every day | ORAL | Status: DC | PRN

## 2015-10-27 MED ORDER — FUROSEMIDE 20 MG PO TABS
20.0000 mg | ORAL_TABLET | Freq: Every day | ORAL | Status: DC
  Administered 2015-10-27 – 2015-10-30 (×4): 20 mg via ORAL
  Filled 2015-10-27 (×4): qty 1

## 2015-10-27 MED ORDER — ACETAMINOPHEN 650 MG RE SUPP: 650.0000 mg | Freq: Four times a day (QID) | RECTAL | Status: DC | PRN

## 2015-10-27 NOTE — ED Triage Notes (Addendum)
[facility] with weakness, GI bleed, and hypotension. Patient current BP is 89/46. EMS started 500 mls of fluid. IV access is an 18 in the RAC. Hx of vaginal bleeding and GI bleed. Had colonoscopy last week. Patient does not remember the results. Was recently discharged after GI bleed and received 4 units of PRBS while in the hospital. Patient now returns. A/Ox4.

## 2015-10-27 NOTE — ED Notes (Signed)
GI at bedside

## 2015-10-27 NOTE — ED Notes (Addendum)
Spoke to MD in regards to administering O-neg blood, not to administer at this time due to pt has hx of antibodies and there is a high chance of rxn to unmatched blood. Will administer fluid bolus and monitor BP closely.

## 2015-10-27 NOTE — Plan of Care (Signed)
Problem: Pain Managment: Goal: General experience of comfort will improve Outcome: Progressing Discussed pain and as needed medications from the [facility] list with teach back demonstrated

## 2015-10-27 NOTE — ED Provider Notes (Signed)
[facility]-EMER[facility]ENCY DEPT Provider Note   CSN: [id number] Arrival date & time: 10/27/15  1143     History   Chief Complaint Chief Complaint  Patient presents with  . Hypotension  . GI Bleeding    HPI [name] is a 70 y.o. female.   Weakness  This is a recurrent problem. The current episode started 1 to 2 hours ago. The problem occurs constantly. The problem has been gradually worsening. Pertinent negatives include no chest pain, no headaches and no shortness of breath. Nothing aggravates the symptoms. Nothing relieves the symptoms. She has tried nothing for the symptoms. The treatment provided no relief.    Past Medical History:  Diagnosis Date  . Anemia   . Arthritis   . Asthma   . Atrial fibrillation (HCC)   . Atrial fibrillation with controlled ventricular response (HCC) 11/2009  . Chronic bronchitis   . Chronic headache   . COPD (chronic obstructive pulmonary disease) (HCC)   . Depression   . Heart attack (HCC)   . Hyperlipidemia   . Hypertension     Patient Active Problem List   Diagnosis Date Noted  . Acute GI bleeding 10/21/2015  . PAF (paroxysmal atrial fibrillation) (HCC) 10/21/2015  . Systolic CHF (HCC) 10/21/2015  . Severe anemia   . Symptomatic anemia   . Coronary artery disease due to lipid rich plaque 02/07/2014  . Obesity 02/07/2014  . Abdominal aortic ectasia (HCC) 09/06/2013  . Encounter for therapeutic drug monitoring 07/13/2013  . Atrial fibrillation (HCC) 12/20/2012  . Atrial fibrillation with controlled ventricular response (HCC) 11/30/2009  . NICOTINE ADDICTION 04/02/2009  . PARAPSORIASIS 12/15/2008  . Hyperlipidemia 12/14/2008  . HEART ATTACK 12/14/2008  . HEADACHE, CHRONIC 12/14/2008  . DEPRESSION 12/13/2008  . Essential hypertension 12/13/2008  . C O P D  Gold C  12/13/2008  . ARTHRITIS 12/13/2008    Past Surgical History:  Procedure Laterality Date  . CAD-CABG     x7, brief post-op Atrial Fib  . CESAREAN SECTION     x2   . COLONOSCOPY WITH PROPOFOL N/A 10/24/2015   Procedure: COLONOSCOPY WITH PROPOFOL;  Surgeon: Carman ChingJames Edwards, MD;  [facility];  Service: Endoscopy;  Laterality: N/A;  . CORONARY ARTERY BYPASS GRAFT  2011  . ESOPHAGOGASTRODUODENOSCOPY N/A 10/22/2015   Procedure: ESOPHAGOGASTRODUODENOSCOPY (EGD);  Surgeon: Carman ChingJames Edwards, MD;  [facility];  Service: Endoscopy;  Laterality: N/A;  . JOINT REPLACEMENT Bilateral   . TONSILLECTOMY    . TOTAL KNEE ARTHROPLASTY      OB History    No data available       Home Medications    Prior to Admission medications   Medication Sig Start Date End Date Taking? Authorizing Provider  ALPRAZolam Prudy Feeler(XANAX) 0.5 MG tablet Take 1 tablet (0.5 mg total) by mouth 2 (two) times daily as needed for anxiety. 10/25/15  Yes Elease EtienneAnand D Hongalgi, MD  furosemide (LASIX) 20 MG tablet Take 1 tablet (20 mg total) by mouth daily. May take additional 20 mg a day for swelling as needed 09/26/15  Yes Jake BatheMark C Skains, MD  pantoprazole (PROTONIX) 40 MG tablet Take 40 mg by mouth daily.  10/10/14  Yes Historical Provider, MD  tiZANidine (ZANAFLEX) 4 MG tablet Take 4 mg by mouth at bedtime as needed for muscle spasms.   Yes Historical Provider, MD  albuterol (PROAIR HFA) 108 (90 BASE) MCG/ACT inhaler INHALE 2 PUFFS INTO THE LUNGS EVERY 6 (SIX) HOURS AS NEEDED. Patient taking differently: Inhale 2 puffs into  the lungs every 4 (four) hours as needed for wheezing or shortness of breath. INHALE 2 PUFFS INTO THE LUNGS EVERY 6 (SIX) HOURS AS NEEDED. 01/15/14   Storm Frisk, MD  atorvastatin (LIPITOR) 20 MG tablet TAKE 1 TABLET (20 MG TOTAL) BY MOUTH DAILY. 10/16/14   Jake Bathe, MD  budesonide-formoterol [facility]) 160-4.5 MCG/ACT inhaler Inhale 2 puffs into the lungs 2 (two) times daily. Patient taking differently: Inhale 2 puffs into the lungs 2 (two) times daily as needed (for shortness of breath).  01/15/14   Storm Frisk, MD  clobetasol cream (TEMOVATE) 0.05 % Apply 1  application topically as needed (for skin).     Historical Provider, MD  losartan (COZAAR) 50 MG tablet Take 1 tablet (50 mg total) by mouth daily. 07/24/15   Jake Bathe, MD  oxymetazoline (AFRIN) 0.05 % nasal spray Place 2 sprays into the nose as needed for congestion.     Historical Provider, MD  Polyethyl Glycol-Propyl Glycol (SYSTANE ULTRA) 0.4-0.3 % SOLN Place 1 drop into both eyes as needed (for dry eyes).    Historical Provider, MD  sotalol (BETAPACE) 80 MG tablet Take 1 tablet (80 mg total) by mouth 2 (two) times daily. 10/25/15   Elease Etienne, MD  traMADol (ULTRAM) 50 MG tablet Take 1 tablet (50 mg total) by mouth every 6 (six) hours as needed for moderate pain. 10/25/15   Elease Etienne, MD    Family History Family History  Problem Relation Age of Onset  . Diabetes Mother   . Hypertension Mother   . Heart disease Mother     before age 80  . Heart disease Father     before age 34  . Hypertension Father   . Heart attack Father   . Lung cancer Maternal Grandfather   . Lung cancer Paternal Grandfather   . Hypertension Daughter   . Stroke Paternal Grandmother     Social History Social History  Substance Use Topics  . Smoking status: Former Smoker    Packs/day: 0.20    Types: Cigarettes  . Smokeless tobacco: Never Used  . Alcohol use No     Allergies   Penicillins and Procaine hcl   Review of Systems Review of Systems  Constitutional: Positive for appetite change and fatigue.  Respiratory: Negative for shortness of breath.   Cardiovascular: Negative for chest pain.  Gastrointestinal: Positive for anal bleeding.  Neurological: Positive for weakness. Negative for headaches.  All other systems reviewed and are negative.    Physical Exam Updated Vital Signs BP 125/58   Pulse (!) 51   Temp 97.7 F (36.5 C) (Oral)   Resp 20   Ht 5\' 2"  (1.575 m)   Wt 180 lb (81.6 kg)   SpO2 98%   BMI 32.92 kg/m   Physical Exam  Constitutional: She appears  well-developed and well-nourished. No distress.  HENT:  Head: Normocephalic and atraumatic.  Eyes: Pupils are equal, round, and reactive to light.  Pale conjunctiva  Neck: Neck supple.  Cardiovascular: Normal rate and regular rhythm.   No murmur heard. Pulmonary/Chest: Effort normal and breath sounds normal. No respiratory distress.  Abdominal: Soft. There is no tenderness.  Genitourinary: Rectal exam shows no external hemorrhoid and no tenderness.  Genitourinary Comments: Bright red dried blood all around rectum  Musculoskeletal: She exhibits no edema.  Neurological: She is alert.  Skin: Skin is warm and dry.  Psychiatric: She has a normal mood and affect.  Nursing note and  vitals reviewed.    ED Treatments / Results  Labs (all labs ordered are listed, but only abnormal results are displayed) Labs Reviewed  CBC WITH DIFFERENTIAL/PLATELET - Abnormal; Notable for the following:       Result Value   RBC 3.13 (*)    Hemoglobin 8.5 (*)    HCT 28.6 (*)    MCHC 29.7 (*)    RDW 21.6 (*)    Platelets 76 (*)    All other components within normal limits  COMPREHENSIVE METABOLIC PANEL - Abnormal; Notable for the following:    Chloride 112 (*)    Glucose, Bld 118 (*)    BUN <5 (*)    Creatinine, Ser 1.07 (*)    Calcium 8.3 (*)    Total Protein 4.3 (*)    Albumin 2.5 (*)    ALT 10 (*)    Total Bilirubin 3.0 (*)    GFR calc non Af Amer 51 (*)    GFR calc Af Amer 59 (*)    Anion gap 4 (*)    All other components within normal limits  I-STAT CHEM 8, ED - Abnormal; Notable for the following:    BUN <3 (*)    Glucose, Bld 111 (*)    Calcium, Ion 1.04 (*)    Hemoglobin 8.5 (*)    HCT 25.0 (*)    All other components within normal limits  I-STAT CG4 LACTIC ACID, ED - Abnormal; Notable for the following:    Lactic Acid, Venous 2.56 (*)    All other components within normal limits  TYPE AND SCREEN  PREPARE RBC (CROSSMATCH)  PREPARE RBC (CROSSMATCH)    EKG  EKG  Interpretation None       Radiology No results found.  Procedures Procedures (including critical care time)  CRITICAL CARE for hypotension, active GI bleed Performed by: Marily Memos Total critical care time: 35 minutes Critical care time was exclusive of separately billable procedures and treating other patients. Critical care was necessary to treat or prevent imminent or life-threatening deterioration. Critical care was time spent personally by me on the following activities: development of treatment plan with patient and/or surrogate as well as nursing, discussions with consultants, evaluation of patient's response to treatment, examination of patient, obtaining history from patient or surrogate, ordering and performing treatments and interventions, ordering and review of laboratory studies, ordering and review of radiographic studies, pulse oximetry and re-evaluation of patient's condition.   Medications Ordered in ED Medications  sodium chloride 0.9 % bolus 1,000 mL (0 mLs Intravenous Stopped 10/27/15 1331)  sodium chloride 0.9 % bolus 500 mL (0 mLs Intravenous Stopped 10/27/15 1420)  0.9 %  sodium chloride infusion ( Intravenous New Bag/Given 10/27/15 1415)     Initial Impression / Assessment and Plan / ED Course  I have reviewed the triage vital signs and the nursing notes.  Pertinent labs & imaging results that were available during my care of the patient were reviewed by me and considered in my medical decision making (see chart for details).  Clinical Course  Comment By Time  Ordered for emergency release, however she has duffy antibody. On quick perusal of statisitics it sounds like that antigen is quite common and the reaction to it is quite severe. Even though patient has low BP her HR is appropriate now, will start fluid resuscitaiton and get blood as soon as possible.  Marily Memos, MD 08/27 1217    After fluids and transfusions, improved BP's. MS still appropriate.  Color improved. Discussed with [facility] who recommended a nuclear medicine tagged red blood cell scan when more stable. Discussed case with critical care who feels like if the blood pressures improved the patient is stable for admission to stepdown. Discussed case with [facility] who will put in admission orders.  Final Clinical Impressions(s) / ED Diagnoses   Final diagnoses:  GI bleed  Anemia, unspecified anemia type  Hypotension, unspecified hypotension type    New Prescriptions New Prescriptions   No medications on file     Marily Memos, MD 10/27/15 1558

## 2015-10-27 NOTE — Consult Note (Signed)
Referring Provider:  Harold Hedge, MD  [facility] EDP) Primary Care Physician:  [name], MD Primary Gastroenterologist:  None  Reason for Consultation:  Recurrent GI bleeding  HPI: Gabrielle [name] is a 70 y.o. female 2 days status post hospital discharge. She was admitted about a week ago with weakness and bright red blood on rectal exam by the EDP, but a hemoglobin of 4, normocytic, heme positive stool, on Eliquis. Please see Dr. Randa Evens consult load at that time for more details on this patient's past GI history.  On that admission, the patient had endoscopy which was normal, and colonoscopy which showed moderate sigmoid diverticulosis, and some degree of friability or mucosal fragility with some contact hemorrhage (note that she runs moderately thrombocytopenic, platelet count in the 100,000 range).  Today, the patient was at [facility] and noticed bright red blood. She was hypotensive when picked up by EMS and brought to the emergency room, and has had a 1 g drop in hemoglobin, from 9.5-8.5, over the past 2 days, without any rise in her BUN.  Medical problems include atrial fibrillation managed with chronic anticoagulation which was held following her recent hospitalization, psoriasis, COPD, hypertension, and apparently history of MI per record, although EF was normal when checked in 2011.  The patient denies abdominal pain or shortness of breath. She has had some back and shoulder pain recently, and a little pain on the right side of her chest, but no substernal chest pain.  Past Medical History:  Diagnosis Date  . Anemia   . Arthritis   . Asthma   . Atrial fibrillation (HCC)   . Atrial fibrillation with controlled ventricular response (HCC) 11/2009  . Chronic bronchitis   . Chronic headache   . COPD (chronic obstructive pulmonary disease) (HCC)   . Depression   . Heart attack (HCC)   . Hyperlipidemia   . Hypertension     Past Surgical History:   Procedure Laterality Date  . CAD-CABG     x7, brief post-op Atrial Fib  . CESAREAN SECTION     x2  . COLONOSCOPY WITH PROPOFOL N/A 10/24/2015   Procedure: COLONOSCOPY WITH PROPOFOL;  Surgeon: Carman Ching, MD;  Location: [facility] ENDOSCOPY;  Service: Endoscopy;  Laterality: N/A;  . CORONARY ARTERY BYPASS GRAFT  2011  . ESOPHAGOGASTRODUODENOSCOPY N/A 10/22/2015   Procedure: ESOPHAGOGASTRODUODENOSCOPY (EGD);  Surgeon: Carman Ching, MD;  Location: [facility] ENDOSCOPY;  Service: Endoscopy;  Laterality: N/A;  . JOINT REPLACEMENT Bilateral   . TONSILLECTOMY    . TOTAL KNEE ARTHROPLASTY      Prior to Admission medications   Medication Sig Start Date End Date Taking? Authorizing Provider  ALPRAZolam Prudy Feeler) 0.5 MG tablet Take 1 tablet (0.5 mg total) by mouth 2 (two) times daily as needed for anxiety. 10/25/15  Yes Elease Etienne, MD  furosemide (LASIX) 20 MG tablet Take 1 tablet (20 mg total) by mouth daily. May take additional 20 mg a day for swelling as needed 09/26/15  Yes Jake Bathe, MD  pantoprazole (PROTONIX) 40 MG tablet Take 40 mg by mouth daily.  10/10/14  Yes Historical Provider, MD  tiZANidine (ZANAFLEX) 4 MG tablet Take 4 mg by mouth at bedtime as needed for muscle spasms.   Yes Historical Provider, MD  albuterol (PROAIR HFA) 108 (90 BASE) MCG/ACT inhaler INHALE 2 PUFFS INTO THE LUNGS EVERY 6 (SIX) HOURS AS NEEDED. Patient taking differently: Inhale 2 puffs into the lungs every 4 (four) hours as needed for wheezing or  shortness of breath. INHALE 2 PUFFS INTO THE LUNGS EVERY 6 (SIX) HOURS AS NEEDED. 01/15/14   Storm Frisk, MD  atorvastatin (LIPITOR) 20 MG tablet TAKE 1 TABLET (20 MG TOTAL) BY MOUTH DAILY. 10/16/14   Jake Bathe, MD  budesonide-formoterol [facility]) 160-4.5 MCG/ACT inhaler Inhale 2 puffs into the lungs 2 (two) times daily. Patient taking differently: Inhale 2 puffs into the lungs 2 (two) times daily as needed (for shortness of breath).  01/15/14   Storm Frisk, MD   clobetasol cream (TEMOVATE) 0.05 % Apply 1 application topically as needed (for skin).     Historical Provider, MD  losartan (COZAAR) 50 MG tablet Take 1 tablet (50 mg total) by mouth daily. 07/24/15   Jake Bathe, MD  oxymetazoline (AFRIN) 0.05 % nasal spray Place 2 sprays into the nose as needed for congestion.     Historical Provider, MD  Polyethyl Glycol-Propyl Glycol (SYSTANE ULTRA) 0.4-0.3 % SOLN Place 1 drop into both eyes as needed (for dry eyes).    Historical Provider, MD  sotalol (BETAPACE) 80 MG tablet Take 1 tablet (80 mg total) by mouth 2 (two) times daily. 10/25/15   Elease Etienne, MD  traMADol (ULTRAM) 50 MG tablet Take 1 tablet (50 mg total) by mouth every 6 (six) hours as needed for moderate pain. 10/25/15   Elease Etienne, MD    No current facility-administered medications for this encounter.    Current Outpatient Prescriptions  Medication Sig Dispense Refill  . ALPRAZolam (XANAX) 0.5 MG tablet Take 1 tablet (0.5 mg total) by mouth 2 (two) times daily as needed for anxiety. 10 tablet 0  . furosemide (LASIX) 20 MG tablet Take 1 tablet (20 mg total) by mouth daily. May take additional 20 mg a day for swelling as needed 180 tablet 1  . pantoprazole (PROTONIX) 40 MG tablet Take 40 mg by mouth daily.     Marland Kitchen tiZANidine (ZANAFLEX) 4 MG tablet Take 4 mg by mouth at bedtime as needed for muscle spasms.    Marland Kitchen albuterol (PROAIR HFA) 108 (90 BASE) MCG/ACT inhaler INHALE 2 PUFFS INTO THE LUNGS EVERY 6 (SIX) HOURS AS NEEDED. (Patient taking differently: Inhale 2 puffs into the lungs every 4 (four) hours as needed for wheezing or shortness of breath. INHALE 2 PUFFS INTO THE LUNGS EVERY 6 (SIX) HOURS AS NEEDED.) 18 g 3  . atorvastatin (LIPITOR) 20 MG tablet TAKE 1 TABLET (20 MG TOTAL) BY MOUTH DAILY. 90 tablet 1  . budesonide-formoterol (SYMBICORT) 160-4.5 MCG/ACT inhaler Inhale 2 puffs into the lungs 2 (two) times daily. (Patient taking differently: Inhale 2 puffs into the lungs 2 (two)  times daily as needed (for shortness of breath). ) 10.2 g 6  . clobetasol cream (TEMOVATE) 0.05 % Apply 1 application topically as needed (for skin).   3  . losartan (COZAAR) 50 MG tablet Take 1 tablet (50 mg total) by mouth daily. 90 tablet 0  . oxymetazoline (AFRIN) 0.05 % nasal spray Place 2 sprays into the nose as needed for congestion.     Bertram Gala Glycol-Propyl Glycol (SYSTANE ULTRA) 0.4-0.3 % SOLN Place 1 drop into both eyes as needed (for dry eyes).    . sotalol (BETAPACE) 80 MG tablet Take 1 tablet (80 mg total) by mouth 2 (two) times daily.    . traMADol (ULTRAM) 50 MG tablet Take 1 tablet (50 mg total) by mouth every 6 (six) hours as needed for moderate pain. 15 tablet 0  Allergies as of 10/27/2015 - Review Complete 10/27/2015  Allergen Reaction Noted  . Penicillins Hives   . Procaine hcl Palpitations     Family History  Problem Relation Age of Onset  . Diabetes Mother   . Hypertension Mother   . Heart disease Mother     before age 72  . Heart disease Father     before age 72  . Hypertension Father   . Heart attack Father   . Lung cancer Maternal Grandfather   . Lung cancer Paternal Grandfather   . Hypertension Daughter   . Stroke Paternal Grandmother     Social History   Social History  . Marital status: Divorced    Spouse name: N/A  . Number of children: N/A  . Years of education: N/A   Occupational History  . retired Clinical biochemistcustomer service [employer]   Social History Main Topics  . Smoking status: Former Smoker    Packs/day: 0.20    Types: Cigarettes  . Smokeless tobacco: Never Used  . Alcohol use No  . Drug use: No  . Sexual activity: Not on file   Other Topics Concern  . Not on file   Social History Narrative  . No narrative on file    Review of Systems: See history of present illness  Physical Exam: Vital signs in last 24 hours: Temp:  [98.3 F (36.8 C)] 98.3 F (36.8 C) (08/27 1148) Pulse Rate:  [49-60] 50 (08/27 1400) Resp:  [14-20] 19  (08/27 1400) BP: (74-99)/(29-52) 87/40 (08/27 1400) SpO2:  [84 %-98 %] 97 % (08/27 1400) Weight:  [81.6 kg (180 lb)] 81.6 kg (180 lb) (08/27 1149)   This is an elderly but not particularly frail appearing female in no acute distress. She is anicteric and without overt pallor. At the time my evaluation, systolic blood pressure is 99, but her skin is quite well perfused. She does not look shocky, and is mentating normally. The chest is clear, the heart rate is rather slow on beta blocker therapy but without arrhythmia, the abdomen is without bruit or pulsatile mass or organomegaly or tenderness. No focal neurologic deficits, no significant peripheral edema.  Intake/Output from previous day: No intake/output data recorded. Intake/Output this shift: No intake/output data recorded.  Lab Results:  Recent Labs  10/25/15 0226 10/27/15 1221 10/27/15 1223  WBC 8.0 6.2  --   HGB 9.5* 8.5* 8.5*  HCT 31.5* 28.6* 25.0*  PLT 76* 76*  --    BMET  Recent Labs  10/25/15 0226 10/27/15 1221 10/27/15 1223  NA 143 143 143  K 3.4* 3.7 3.6  CL 109 112* 106  CO2 28 27  --   GLUCOSE 115* 118* 111*  BUN 5* <5* <3*  CREATININE 1.19* 1.07* 1.00  CALCIUM 8.6* 8.3*  --    LFT  Recent Labs  10/27/15 1221  PROT 4.3*  ALBUMIN 2.5*  AST 32  ALT 10*  ALKPHOS 56  BILITOT 3.0*   PT/INR No results for input(s): LABPROT, INR in the last 72 hours.  Studies/Results: No results found.  Impression: 1. Hematochezia with hypotension and drop in hemoglobin, despite being off anticoagulation 2. Recent presentation with profound anemia with red blood on rectal exam but no recognized overt GI hemorrhage, while previously on Eliquis.  Overall, I think this is most compatible with a diverticular bleed.  Plan: I think there is little role for an attempted colonoscopy at this time, especially with the patient being somewhat unstable. The likelihood of identifying  and fixing a discrete bleeding site by  that method would be quite low.  Therefore, I would recommend supportive care, and if she shows signs of persistent or acute bleeding, I would then favor a bleeding scan with consideration of interventional radiology if positive.   LOS: 0 days   Taegen Lennox V  10/27/2015, 2:10 PM   Pager [phone] If no answer or after 5 PM call [phone]

## 2015-10-27 NOTE — H&P (Signed)
History and Physical    Gabrielle [name] ZOX:[id number] DOB: [birth year] DOA: 10/27/2015  PCP: [name], MD  Patient coming from:   [facility]   Chief Complaint:  recurrent rectal bleeding  HPI: Gabrielle [name] is a 69 y.o. female with a medical history significant for, but not  limited to, COPD, atrial fibrillation, coronary artery disease. Patient was discharged from the hospital 2 days ago after being admitted for hematochezia and hemoglobin of 4 on anticoagulation. Complete colonoscopy was done. Bowel prep was good. 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 The colon was diffusely friable with contact bleeding. Grade 1 hemorrhoids were found. Upper endoscopy was normal. Patient was transfused several units of blood, hemoglobin rose to 9.5 by time of hospital discharge on 8/25. She was discharged to [facility] and did fine without any recurrent bleeding for 2 days.. Today staff at SNF apparently noticed that patient was having recurrent hematochezia. EMS was called, patient found to be hypotensive with systolic blood pressure in the eighties. Patient has not had any anticoagulants since recent GI bleed. No abdominal pain but patient does complain of low back pain. No worsening of her chronic shortness of breath. No dizziness or chest pain.  ED Course:  Afebrile, heart rate in the fifties, BP upon arrival 89 /46.  Presenting hemoglobin 8.5, down from 9.5 two days ago. 2 L fluid bolus given. Patient is getting one of 2 ordered units of blood now.   Review of Systems: As per HPI, otherwise 10 point review of systems negative.    Past Medical History:  Diagnosis Date  . Anemia   . Arthritis   . Asthma   . Atrial fibrillation (HCC)   . Atrial fibrillation with controlled ventricular response (HCC) 11/2009  . Chronic bronchitis   . Chronic headache   . COPD (chronic obstructive pulmonary disease) (HCC)   . Depression   . Heart attack (HCC)   .  Hyperlipidemia   . Hypertension     Past Surgical History:  Procedure Laterality Date  . CAD-CABG     x7, brief post-op Atrial Fib  . CESAREAN SECTION     x2  . COLONOSCOPY WITH PROPOFOL N/A 10/24/2015   Procedure: COLONOSCOPY WITH PROPOFOL;  Surgeon: Carman Ching, MD;  Location: [facility] ENDOSCOPY;  Service: Endoscopy;  Laterality: N/A;  . CORONARY ARTERY BYPASS GRAFT  2011  . ESOPHAGOGASTRODUODENOSCOPY N/A 10/22/2015   Procedure: ESOPHAGOGASTRODUODENOSCOPY (EGD);  Surgeon: Carman Ching, MD;  Location: [facility] ENDOSCOPY;  Service: Endoscopy;  Laterality: N/A;  . JOINT REPLACEMENT Bilateral   . TONSILLECTOMY    . TOTAL KNEE ARTHROPLASTY      Social History   Social History  . Marital status: Divorced    Spouse name: N/A  . Number of children: N/A  . Years of education: N/A   Occupational History  . retired Clinical biochemist   Social History Main Topics  . Smoking status: Former Smoker    Packs/day: 0.20    Types: Cigarettes  . Smokeless tobacco: Never Used  . Alcohol use No  . Drug use: No  . Sexual activity: Not on file   Other Topics Concern  . Not on file   Social History Narrative  . No narrative on file   Residing at SNF since last hosp discharge two days ago. Uses walker for ambulation  Allergies  Allergen Reactions  . Penicillins Hives  . Procaine Hcl Palpitations    Family History  Problem Relation Age  of Onset  . Diabetes Mother   . Hypertension Mother   . Heart disease Mother     before age 50  . Heart disease Father     before age 21  . Hypertension Father   . Heart attack Father   . Lung cancer Maternal Grandfather   . Lung cancer Paternal Grandfather   . Hypertension Daughter   . Stroke Paternal Grandmother     Prior to Admission medications   Medication Sig Start Date End Date Taking? Authorizing Provider  ALPRAZolam Prudy Feeler) 0.5 MG tablet Take 1 tablet (0.5 mg total) by mouth 2 (two) times daily as needed for anxiety. 10/25/15  Yes Elease Etienne, MD  furosemide (LASIX) 20 MG tablet Take 1 tablet (20 mg total) by mouth daily. May take additional 20 mg a day for swelling as needed 09/26/15  Yes Jake Bathe, MD  pantoprazole (PROTONIX) 40 MG tablet Take 40 mg by mouth daily.  10/10/14  Yes Historical Provider, MD  tiZANidine (ZANAFLEX) 4 MG tablet Take 4 mg by mouth at bedtime as needed for muscle spasms.   Yes Historical Provider, MD  albuterol (PROAIR HFA) 108 (90 BASE) MCG/ACT inhaler INHALE 2 PUFFS INTO THE LUNGS EVERY 6 (SIX) HOURS AS NEEDED. Patient taking differently: Inhale 2 puffs into the lungs every 4 (four) hours as needed for wheezing or shortness of breath. INHALE 2 PUFFS INTO THE LUNGS EVERY 6 (SIX) HOURS AS NEEDED. 01/15/14   Storm Frisk, MD  atorvastatin (LIPITOR) 20 MG tablet TAKE 1 TABLET (20 MG TOTAL) BY MOUTH DAILY. 10/16/14   Jake Bathe, MD  budesonide-formoterol [facility]) 160-4.5 MCG/ACT inhaler Inhale 2 puffs into the lungs 2 (two) times daily. Patient taking differently: Inhale 2 puffs into the lungs 2 (two) times daily as needed (for shortness of breath).  01/15/14   Storm Frisk, MD  clobetasol cream (TEMOVATE) 0.05 % Apply 1 application topically as needed (for skin).     Historical Provider, MD  losartan (COZAAR) 50 MG tablet Take 1 tablet (50 mg total) by mouth daily. 07/24/15   Jake Bathe, MD  oxymetazoline (AFRIN) 0.05 % nasal spray Place 2 sprays into the nose as needed for congestion.     Historical Provider, MD  Polyethyl Glycol-Propyl Glycol (SYSTANE ULTRA) 0.4-0.3 % SOLN Place 1 drop into both eyes as needed (for dry eyes).    Historical Provider, MD  sotalol (BETAPACE) 80 MG tablet Take 1 tablet (80 mg total) by mouth 2 (two) times daily. 10/25/15   Elease Etienne, MD  traMADol (ULTRAM) 50 MG tablet Take 1 tablet (50 mg total) by mouth every 6 (six) hours as needed for moderate pain. 10/25/15   Elease Etienne, MD    Physical Exam: Vitals:   10/27/15 1415 10/27/15 1430 10/27/15  1445 10/27/15 1500  BP: (!) 96/48 (!) 107/53 (!) 106/51 103/55  Pulse: (!) 55 (!) 52 (!) 52 (!) 52  Resp: 20 20 22 23   Temp: 98.3 F (36.8 C) 97.7 F (36.5 C)    TempSrc: Oral Oral    SpO2: 96% 96% 96% 96%  Weight:      Height:        Constitutional:  Less than, obese white female in NAD, calm, comfortable Vitals:   10/27/15 1415 10/27/15 1430 10/27/15 1445 10/27/15 1500  BP: (!) 96/48 (!) 107/53 (!) 106/51 103/55  Pulse: (!) 55 (!) 52 (!) 52 (!) 52  Resp: 20 20 22 23   Temp:  98.3 F (36.8 C) 97.7 F (36.5 C)    TempSrc: Oral Oral    SpO2: 96% 96% 96% 96%  Weight:      Height:       Eyes: PER, lids and conjunctivae Pale  ENMT: Mucous membranes slightly dry. Posterior pharynx clear of any exudate or lesions..  Neck: normal, supple, no masses Respiratory: Clear to auscultation bilaterally , no crackles. Normal respiratory effort. No accessory muscle use.  Cardiovascular: Bradycardic, irregular rhythm . Murmur present. No extremity edema. 2+ dorsal pedis pulses.   Abdomen: Soft, nondistended no tenderness, no masses palpated. No hepatomegaly. Bowel sounds positive.  Musculoskeletal: no clubbing / cyanosis. No joint deformity upper and lower extremities. Good ROM, no contractures. Normal muscle tone.  Skin: Pale, psoriasis of distal bilateral lower extremities  Neurologic: CN 2-12 grossly intact. Sensation intact, Strength 5/5 in all 4.  Psychiatric: Normal judgment and insight. Alert and oriented x 3. Normal mood.   Labs on Admission: I have personally reviewed following labs and imaging studies  Radiological Exams on Admission: No results found.   Assessment/Plan   Active Problems:   Essential hypertension   COPD (chronic obstructive pulmonary disease) (HCC)   Atrial fibrillation with controlled ventricular response (HCC)   Encounter for therapeutic drug monitoring   Gastrointestinal hemorrhage   Anemia due to blood loss, acute   Elevated lactic acid level    Systolic heart failure (HCC)      Recurrent hematochezia with hemodynamic instability.  Discharged from here 2 days ago after GI bleed /severe anemia on Eloquis. BP improved after 2 Liters of fluid.      -Admit to [facility] has Already evaluated the patient. Repeat endoscopic workup will not be helpful, bleeding scan recommended instead.  -NPO except for chips and sips. Gentle IV hydration at 52ml/hr -Getting a unit of blood now, checking CBC after second unit of blood. I left nursing order to call if hgb less than 8.5 because we may need to crossmatch additional blood as patient has + antibodies and it could take a while to get the blood here -hold BP meds except for beta blocker  Anemia of acute blood loss. Hgb 8.5, down a gram since discharge 2 days ago.  -Continue with 2 units of PRBC -monitor CBC    Atrial fibrillation. Rate controlled. Chadsvasc 4. Eloquis has been on hold over last few days secondary to recent GI bleed.  -cardiac monitoring -continue betapace  Hypertension, hypotensive from GI bleed today.  -will continue betapace for atrial fib but hold Cozaar until BP stabilizes. Lasix already on hold since last admission   Elevated lactic acid, 2.56. No signs / symptoms of infection.  -Will trend levels, should improve after fluids  COPD, stable -Continue home program where and Symbicort inhalers   CAD, remote CABG. No chest pain.   GERD. -Continue daily PPI  Anxiety -Continue Xanax twice daily as needed  .  Hx of systolic heart failure. No evidence for volume overload. EF 45-50% per Dr. Anne Fu note 07/25/15.  -ARB and Lasix both held during recent admission because of AKI. Renal function has normalized but will continue to hold both for hypotension  -monitor intake and output -daily wts  Recent QT prolongation of 515, currently 499. Celexa was discontinued during recent admission because of T prolongation, it was not resumed at discharge  Recent AKI with Cr up to 1.85. Renal function now normal after holding ARB and lasix upon discharge.    DVT prophylaxis:      SCDs   Code Status:     Full code  Family Communication:    Treatment plan discussed with two sisters in the room and they understand and agree with the plan..  Disposition Plan:   Discharge home in 24-48 hours              Consults called:  [facility] - evaluated by Dr.Buccini Admission status:   Observation - stepdown     Willette ClusterPaula Guenther NP [facility] Pager [phone]  If 7PM-7AM, please contact night-coverage [email or web address] Password [email or web address]  10/27/2015, 3:16 PM

## 2015-10-27 NOTE — ED Notes (Signed)
Hospitalist at bedside 

## 2015-10-28 DIAGNOSIS — I5022 Chronic systolic (congestive) heart failure: Secondary | ICD-10-CM

## 2015-10-28 LAB — CBC
HCT: 35 % — ABNORMAL LOW (ref 36.0–46.0)
Hemoglobin: 10.7 g/dL — ABNORMAL LOW (ref 12.0–15.0)
MCH: 27.2 pg (ref 26.0–34.0)
MCHC: 30.6 g/dL (ref 30.0–36.0)
MCV: 89.1 fL (ref 78.0–100.0)
Platelets: 71 10*3/uL — ABNORMAL LOW (ref 150–400)
RBC: 3.93 MIL/uL (ref 3.87–5.11)
RDW: 21.2 % — ABNORMAL HIGH (ref 11.5–15.5)
WBC: 6.5 10*3/uL (ref 4.0–10.5)

## 2015-10-28 LAB — BASIC METABOLIC PANEL
Anion gap: 5 (ref 5–15)
BUN: <5 mg/dL — ABNORMAL LOW (ref 6–20)
CO2: 24 mmol/L (ref 22–32)
Calcium: 8.1 mg/dL — ABNORMAL LOW (ref 8.9–10.3)
Chloride: 113 mmol/L — ABNORMAL HIGH (ref 101–111)
Creatinine, Ser: 1.04 mg/dL — ABNORMAL HIGH (ref 0.44–1.00)
GFR calc Af Amer: >60 mL/min (ref >60)
GFR calc non Af Amer: 52 mL/min — ABNORMAL LOW (ref >60)
Glucose, Bld: 96 mg/dL (ref 65–99)
Potassium: 3.7 mmol/L (ref 3.5–5.1)
Sodium: 142 mmol/L (ref 135–145)

## 2015-10-28 NOTE — Clinical Social Work Note (Signed)
Clinical Social Work Assessment  Patient Details  Name: Gabrielle [name] MRN: [id number] Date of Birth: 25-Apr-1945  Date of referral:  10/28/15               Reason for consult:  Facility Placement                Permission sought to share information with:  Facility Medical sales representativeContact Representative, Family Supports Permission granted to share information::  Yes, Verbal Permission Granted  Name::     Aeronautical engineerJudy  Agency::  SNFs  Relationship::  Sister  Contact Information:  [phone]  Housing/Transportation Living arrangements for the past 2 months:  Single Family Home Source of Information:  Patient Patient Interpreter Needed:  None Criminal Activity/Legal Involvement Pertinent to Current Situation/Hospitalization:  No - Comment as needed Significant Relationships:  Siblings, Adult Children Lives with:  Self Do you feel safe going back to the [name] where you live?  No Need for family participation in patient care:  No (Coment)  Care giving concerns:  CSW received consult regarding discharge planning. [name] expects to return there at discharge. She stated she felt she was released from the hospital too soon last time. CSW to continue to follow for discharge needs.    Social Worker assessment / plan:  Patient to discharge to SNF when medically stable.  Employment status:  Retired Database administratornsurance information:  Managed Medicare PT Recommendations:  Not assessed at this time Information / Referral to community resources:  Skilled Nursing Facility  Patient/Family's Response to care:  Patient recognizes need for rehab before returning home and [name]  Patient/Family's Understanding of and Emotional Response to Diagnosis, Current Treatment, and Prognosis:  Patient/family is realistic regarding therapy needs and expressed being hopeful for SNF placement. No questions/concerns about plan or treatment.    Emotional  Assessment Appearance:  Appears stated age Attitude/Demeanor/Rapport:  Other (Appropriate) Affect (typically observed):  Accepting, Appropriate Orientation:  Oriented to Self, Oriented to Situation, Oriented to [name], Oriented to  Time Alcohol / Substance use:  Not Applicable Psych involvement (Current and /or in the community):  No (Comment)  Discharge Needs  Concerns to be addressed:  Care Coordination Readmission within the last 30 days:  Yes Current discharge risk:  None Barriers to Discharge:  Continued Medical Work up   [employer] S Rithika Seel, LCSWA 10/28/2015, 3:59 PM

## 2015-10-28 NOTE — Progress Notes (Signed)
Subjective: No abdominal pain. 3 red bloody stools yesterday, some old blood today.  Objective: Vital signs in last 24 hours: Temp:  [97.4 F (36.3 C)-98.5 F (36.9 C)] 98.4 F (36.9 C) (08/28 0700) Pulse Rate:  [49-66] 56 (08/28 0700) Resp:  [14-26] 14 (08/28 0700) BP: (74-139)/(29-85) 130/57 (08/28 0700) SpO2:  [84 %-99 %] 96 % (08/28 0700) Weight:  [81.6 kg (180 lb)-90.4 kg (199 lb 4.7 oz)] 90.3 kg (199 lb 1.2 oz) (08/28 0245) Weight change:  Last BM Date: 10/27/15  PE: GEN:  Overweight, deconditioned, older-appearing than stated age ABD:  Protuberant, non-tender, soft  Lab Results: CBC    Component Value Date/Time   WBC 6.5 10/28/2015 0149   RBC 3.93 10/28/2015 0149   HGB 10.7 (L) 10/28/2015 0149   HCT 35.0 (L) 10/28/2015 0149   PLT 71 (L) 10/28/2015 0149   MCV 89.1 10/28/2015 0149   MCH 27.2 10/28/2015 0149   MCHC 30.6 10/28/2015 0149   RDW 21.2 (H) 10/28/2015 0149   LYMPHSABS 1.4 10/27/2015 1221   MONOABS 0.7 10/27/2015 1221   EOSABS 0.2 10/27/2015 1221   BASOSABS 0.1 10/27/2015 1221   CMP     Component Value Date/Time   NA 142 10/28/2015 0149   K 3.7 10/28/2015 0149   CL 113 (H) 10/28/2015 0149   CO2 24 10/28/2015 0149   GLUCOSE 96 10/28/2015 0149   BUN <5 (L) 10/28/2015 0149   CREATININE 1.04 (H) 10/28/2015 0149   CALCIUM 8.1 (L) 10/28/2015 0149   PROT 4.3 (L) 10/27/2015 1221   ALBUMIN 2.5 (L) 10/27/2015 1221   AST 32 10/27/2015 1221   ALT 10 (L) 10/27/2015 1221   ALKPHOS 56 10/27/2015 1221   BILITOT 3.0 (H) 10/27/2015 1221   GFRNONAA 52 (L) 10/28/2015 0149   GFRAA >60 10/28/2015 0149   INR ~ 2 10/23/15  Assessment:  1.  Acute blood loss anemia. 2.  Recurrent hematochezia.  Recent hospitalization.  Recent endoscopy and colonoscopy.  Suspect diverticular.  Is slowing down. 3.  Bleeding tendency, on antiplatelet agents, thrombocytopenia and INR 2 (few days ago).  Certainly contributing to patient's bleeding tendency.  Plan:  1.  Clear liquid  diet only. 2.  Follow serial CBCs. 3.  Avoid antiplatelet and anticoagulant agents. 4.  If rampant rebleeding, would pursue tagged RBC study as next step in management; otherwise, would observe expectantly. 5.  [facility] will follow.   Freddy JakschOUTLAW,Briyah Wheelwright M 10/28/2015, 9:08 AM   Pager [phone] If no answer or after 5 PM call [phone]

## 2015-10-28 NOTE — Progress Notes (Signed)
PROGRESS NOTE    Gabrielle [name]  ZOX:[id number] DOB: [birth year] DOA: 10/27/2015 PCP: [name], MD   Chief Complaint  Patient presents with  . Hypotension  . GI Bleeding    Brief Narrative:  HPI on 10/27/2015 by Ms. Willette Cluster, NP Gabrielle [name] is a 70 y.o. female with a medical history significant for, but not  limited to, COPD, atrial fibrillation, coronary artery disease. Patient was discharged from the hospital 2 days ago after being admitted for hematochezia and hemoglobin of 4 on anticoagulation. Complete colonoscopy was done. Bowel prep was good. 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 The colon was diffusely friable with contact bleeding. Grade 1 hemorrhoids were found. Upper endoscopy was normal. Patient was transfused several units of blood, hemoglobin rose to 9.5 by time of hospital discharge on 8/25. She was discharged to [facility] and did fine without any recurrent bleeding for 2 days.. Today staff at SNF apparently noticed that patient was having recurrent hematochezia. EMS was called, patient found to be hypotensive with systolic blood pressure in the eighties. Patient has not had any anticoagulants since recent GI bleed. No abdominal pain but patient does complain of low back pain. No worsening of her chronic shortness of breath. No dizziness or chest pain.  Assessment & Plan   Recurrent hematochezia/Acute blood loss anemia/Symptomatic anemia -Upon Admission, patient was noted to be hypotensive.  She was given blood transfusion (2u) as well as IV fluid, blood pressure has resolved. -Patient recently discharged a few days ago after GI bleed and severe anemia. She was continued on Eliquis. -Upon admission, hemoglobin was 8.5 -Currently hemoglobin 10.7 -Gastroenterology consulted and appreciated. Recommended bleeding scan if patient does develop profuse bleeding. Suspect diverticular bleed. Feels that patient does have bleeding tendency  given that she is on anticoagulation and has thrombocytopenia. Should continue to hold these agents. Continue clear liquid diet. -continue to monitor CBC    Atrial fibrillation -CHADSVASC 4 -Eliqius currently held due to the above -Continue Betapace  Hypertension -Patient presented with hypotension likely due to GI bleed   -Cozaar currently held, Lasix has been held since last admission -Hypertension does seem to have resolved  Elevated lactic acid -Upon admission, lactic acid 2.56 -Currently afebrile with no leukocytosis -No complaints of cough, or urinary symptoms  -Possibly due to GI bleed. Lactic acid now has normalized, 1.8  COPD -Currently stable, no wheezing -Continue home regimen as well as Symbicort  CAD, remote CABG -Chest pain-free  GERD -Continue daily PPI  Anxiety -Continue Xanax twice daily as needed  .  Chronic systolic heart failure -Currently appears to be euvolemic -EF noted to be 4550% per documentation 07/25/2015 -Lasix had last admission due to acute kidney injury -Renal function has normalized over patient did present with hypotension -Continue to monitor intake and output, daily weights  Recent QT prolongation -QT 515, currently 499.  -Celexa was discontinued during recent admission because of T prolongation, it was not resumed at discharge                                     Recent AKI  -Seems to have resolved, creatinine was up to 1.85 on previous admission (10/21/2015) -Currently creatinine 1.04 -Continue to monitor BMP  DVT Prophylaxis  SCDs  Code Status: Full  Family Communication: None at beside  Disposition Plan: Admitted, continue to monitor  Consultants Gastroenterology  Procedures  None  Antibiotics   Anti-infectives    None      Subjective:   Gabrielle [name] seen and examined today.  Patient noted darker stools today. No bright red blood.  Denies dizziness, chest pain, shortness of breath, abdominal  pain.  Objective:   Vitals:   10/28/15 0500 10/28/15 0600 10/28/15 0700 10/28/15 1216  BP: (!) 121/54 122/60 (!) 130/57 (!) 149/64  Pulse: (!) 52 (!) 54 (!) 56 (!) 50  Resp: 16 17 14 16   Temp:   98.4 F (36.9 C) 97.8 F (36.6 C)  TempSrc:   Oral Oral  SpO2: 94% 95% 96% 95%  Weight:      Height:        Intake/Output Summary (Last 24 hours) at 10/28/15 1237 Last data filed at 10/28/15 1217  Gross per 24 hour  Intake             1064 ml  Output             1125 ml  Net              -61 ml   Filed Weights   10/27/15 1149 10/27/15 2000 10/28/15 0245  Weight: 81.6 kg (180 lb) 90.4 kg (199 lb 4.7 oz) 90.3 kg (199 lb 1.2 oz)    Exam  General: Well developed, well nourished, NAD, appears stated age  HEENT: NCAT,  mucous membranes moist.   Cardiovascular: S1 S2 auscultated, no rubs, murmurs or gallops. bradycardic  Respiratory: Clear to auscultation bilaterally with equal chest rise  Abdomen: Soft, nontender, nondistended, + bowel sounds  Extremities: warm dry without cyanosis clubbing or edema  Neuro: AAOx3, nonfocal  Psych: Normal affect and demeanor with intact judgement and insight   Data Reviewed: I have personally reviewed following labs and imaging studies  CBC:  Recent Labs Lab 10/24/15 0227 10/25/15 0226 10/27/15 1221 10/27/15 1223 10/27/15 1928 10/28/15 0149  WBC 8.1 8.0 6.2  --  7.1 6.5  NEUTROABS  --   --  3.8  --   --   --   HGB 9.3* 9.5* 8.5* 8.5* 10.8* 10.7*  HCT 30.7* 31.5* 28.6* 25.0* 35.2* 35.0*  MCV 87.2 89.2 91.4  --  88.7 89.1  PLT 90* 76* 76*  --  69* 71*   Basic Metabolic Panel:  Recent Labs Lab 10/23/15 0722 10/24/15 0227 10/25/15 0226 10/27/15 1221 10/27/15 1223 10/28/15 0149  NA 139 140 143 143 143 142  K 3.7 3.4* 3.4* 3.7 3.6 3.7  CL 106 108 109 112* 106 113*  CO2 27 27 28 27   --  24  GLUCOSE 122* 132* 115* 118* 111* 96  BUN 15 7 5* <5* <3* <5*  CREATININE 1.65* 1.37* 1.19* 1.07* 1.00 1.04*  CALCIUM 8.3* 8.5* 8.6*  8.3*  --  8.1*  MG  --  1.4*  --   --   --   --    GFR: Estimated Creatinine Clearance: 51.1 mL/min (by C-G formula based on SCr of 1.04 mg/dL). Liver Function Tests:  Recent Labs Lab 10/21/15 1447 10/27/15 1221  AST 31 32  ALT 8* 10*  ALKPHOS 47 56  BILITOT 1.9* 3.0*  PROT 4.5* 4.3*  ALBUMIN 2.8* 2.5*   No results for input(s): LIPASE, AMYLASE in the last 168 hours. No results for input(s): AMMONIA in the last 168 hours. Coagulation Profile:  Recent Labs Lab 10/21/15 1447 10/22/15 1535 10/23/15 0722  INR 2.72 2.02 2.01   Cardiac Enzymes:  Recent Labs Lab  10/21/15 1447  TROPONINI 0.04*   BNP (last 3 results) No results for input(s): PROBNP in the last 8760 hours. HbA1C: No results for input(s): HGBA1C in the last 72 hours. CBG: No results for input(s): GLUCAP in the last 168 hours. Lipid Profile: No results for input(s): CHOL, HDL, LDLCALC, TRIG, CHOLHDL, LDLDIRECT in the last 72 hours. Thyroid Function Tests: No results for input(s): TSH, T4TOTAL, FREET4, T3FREE, THYROIDAB in the last 72 hours. Anemia Panel: No results for input(s): VITAMINB12, FOLATE, FERRITIN, TIBC, IRON, RETICCTPCT in the last 72 hours. Urine analysis:    Component Value Date/Time   COLORURINE YELLOW 10/21/2015 1742   APPEARANCEUR HAZY (A) 10/21/2015 1742   LABSPEC 1.018 10/21/2015 1742   PHURINE 5.0 10/21/2015 1742   GLUCOSEU NEGATIVE 10/21/2015 1742   HGBUR MODERATE (A) 10/21/2015 1742   BILIRUBINUR SMALL (A) 10/21/2015 1742   KETONESUR NEGATIVE 10/21/2015 1742   PROTEINUR 30 (A) 10/21/2015 1742   UROBILINOGEN 1.0 12/02/2010 1023   NITRITE NEGATIVE 10/21/2015 1742   LEUKOCYTESUR SMALL (A) 10/21/2015 1742   Sepsis Labs: @LABRCNTIP (procalcitonin:4,lacticidven:4)  ) Recent Results (from the past 240 hour(s))  MRSA PCR Screening     Status: Abnormal   Collection Time: 10/21/15  6:53 PM  Result Value Ref Range Status   MRSA by PCR POSITIVE (A) NEGATIVE Final    Comment:         The GeneXpert MRSA Assay (FDA approved for NASAL specimens only), is one component of a comprehensive MRSA colonization surveillance program. It is not intended to diagnose MRSA infection nor to guide or monitor treatment for MRSA infections. RESULT CALLED TO, READ BACK BY AND VERIFIED WITH: H RICHARD 10/21/15 @ 2133 M VESTAL   MRSA PCR Screening     Status: Abnormal   Collection Time: 10/27/15  8:05 PM  Result Value Ref Range Status   MRSA by PCR POSITIVE (A) NEGATIVE Final    Comment:        The GeneXpert MRSA Assay (FDA approved for NASAL specimens only), is one component of a comprehensive MRSA colonization surveillance program. It is not intended to diagnose MRSA infection nor to guide or monitor treatment for MRSA infections. RESULT CALLED TO, READ BACK BY AND VERIFIED WITH: NEILSON,T RN 2250 10/27/15 MITCHELL,L        Radiology Studies: No results found.   Scheduled Meds: . Chlorhexidine Gluconate Cloth  6 each Topical Q0600  . furosemide  20 mg Oral Daily  . mometasone-formoterol  2 puff Inhalation BID  . mupirocin ointment  1 application Nasal BID  . pantoprazole  40 mg Oral Daily  . sodium chloride flush  3 mL Intravenous Q12H  . sotalol  80 mg Oral BID   Continuous Infusions: . sodium chloride 75 mL/hr at 10/27/15 2140     LOS: 1 day   Time Spent in minutes   30 minutes  Jaron Czarnecki D.O. on 10/28/2015 at 12:37 PM  Between 7am to 7pm - Pager - [phone]  After 7pm go to [email or web address] - password [email or web address]  And look for the night coverage person covering for me after hours  [facility] Office  [phone]

## 2015-10-28 NOTE — [place] FL2 (Signed)
[place] MEDICAID FL2 LEVEL OF CARE SCREENING TOOL     IDENTIFICATION  Patient Name: Gabrielle [name] Birthdate: Aug 04, 1945 Sex: female Admission Date (Current Location): 10/27/2015  [facility] [place] [place]   Facility and Address:  [facility] [place], [address], [place], [place] [address]      Provider Number: [id number]  Attending Physician Name and Address:  Edsel PetrinMaryann Mikhail, DO  Relative Name and Phone Number:       Current Level of Care: Hospital Recommended Level of Care: Skilled Nursing Facility Prior Approval Number:    Date Approved/Denied:   PASRR Number:    Discharge Plan: SNF    Current Diagnoses: Patient Active Problem List   Diagnosis Date Noted  . Gastrointestinal hemorrhage 10/27/2015  . Anemia due to blood loss, acute 10/27/2015  . Elevated lactic acid level 10/27/2015  . Systolic heart failure (HCC) 10/27/2015  . Arterial hypotension   . Acute GI bleeding 10/21/2015  . PAF (paroxysmal atrial fibrillation) (HCC) 10/21/2015  . Systolic CHF (HCC) 10/21/2015  . Severe anemia   . Symptomatic anemia   . Coronary artery disease due to lipid rich plaque 02/07/2014  . Obesity 02/07/2014  . Abdominal aortic ectasia (HCC) 09/06/2013  . Encounter for therapeutic drug monitoring 07/13/2013  . Atrial fibrillation (HCC) 12/20/2012  . Atrial fibrillation with controlled ventricular response (HCC) 11/30/2009  . NICOTINE ADDICTION 04/02/2009  . PARAPSORIASIS 12/15/2008  . Hyperlipidemia 12/14/2008  . HEART ATTACK 12/14/2008  . HEADACHE, CHRONIC 12/14/2008  . DEPRESSION 12/13/2008  . Essential hypertension 12/13/2008  . COPD (chronic obstructive pulmonary disease) (HCC) 12/13/2008  . ARTHRITIS 12/13/2008    Orientation RESPIRATION BLADDER Height & Weight     Self, Time, Situation, Place  Normal Continent Weight: 90.3 kg (199 lb 1.2 oz) Height:  5\' 2"  (157.5 cm)  BEHAVIORAL SYMPTOMS/MOOD NEUROLOGICAL BOWEL NUTRITION  STATUS      Continent Diet  AMBULATORY STATUS COMMUNICATION OF NEEDS Skin   Limited Assist Verbally Normal                       Personal Care Assistance Level of Assistance  Bathing, Dressing Bathing Assistance: Limited assistance   Dressing Assistance: Limited assistance     Functional Limitations Info             SPECIAL CARE FACTORS FREQUENCY  PT (By licensed PT), OT (By licensed OT)     PT Frequency: 5/wk OT Frequency: 5/wk            Contractures      Additional Factors Info  Code Status, Allergies Code Status Info: FULL Allergies Info: Novocain Procaine, Penicillins           Current Medications (10/28/2015):  This is the current hospital active medication list Current Facility-Administered Medications  Medication Dose Route Frequency Provider Last Rate Last Dose  . 0.9 %  sodium chloride infusion   Intravenous Continuous Esperanza SheetsUlugbek N Buriev, MD 75 mL/hr at 10/27/15 2140    . acetaminophen (TYLENOL) tablet 650 mg  650 mg Oral Q6H PRN Meredith PelPaula M Guenther, NP       Or  . acetaminophen (TYLENOL) suppository 650 mg  650 mg Rectal Q6H PRN Meredith PelPaula M Guenther, NP      . albuterol (PROVENTIL) (2.5 MG/3ML) 0.083% nebulizer solution 2.5 mg  2.5 mg Nebulization Q4H PRN Esperanza SheetsUlugbek N Buriev, MD      . ALPRAZolam Prudy Feeler(XANAX) tablet 0.5 mg  0.5 mg Oral BID  PRN Meredith Pel, NP   0.5 mg at 10/27/15 2132  . bisacodyl (DULCOLAX) EC tablet 5 mg  5 mg Oral Daily PRN Meredith Pel, NP      . Chlorhexidine Gluconate Cloth 2 % PADS 6 each  6 each Topical Q0600 Esperanza Sheets, MD      . furosemide (LASIX) tablet 20 mg  20 mg Oral Daily Esperanza Sheets, MD   20 mg at 10/28/15 0932  . mometasone-formoterol (DULERA) 200-5 MCG/ACT inhaler 2 puff  2 puff Inhalation BID Meredith Pel, NP   2 puff at 10/28/15 0845  . mupirocin ointment (BACTROBAN) 2 % 1 application  1 application Nasal BID Esperanza Sheets, MD      . ondansetron [facility]) tablet 4 mg  4 mg Oral Q6H PRN Meredith Pel, NP       Or  . ondansetron [facility]) injection 4 mg  4 mg Intravenous Q6H PRN Meredith Pel, NP      . oxymetazoline (AFRIN) 0.05 % nasal spray 2 spray  2 spray Each Nare PRN Meredith Pel, NP      . pantoprazole (PROTONIX) EC tablet 40 mg  40 mg Oral Daily Meredith Pel, NP   40 mg at 10/28/15 0932  . polyethylene glycol (MIRALAX / GLYCOLAX) packet 17 g  17 g Oral Daily PRN Meredith Pel, NP      . polyvinyl alcohol (LIQUIFILM TEARS) 1.4 % ophthalmic solution 1 drop  1 drop Both Eyes PRN Esperanza Sheets, MD      . sodium chloride flush (NS) 0.9 % injection 3 mL  3 mL Intravenous Q12H Meredith Pel, NP   3 mL at 10/27/15 2200  . sotalol (BETAPACE) tablet 80 mg  80 mg Oral BID Meredith Pel, NP   80 mg at 10/28/15 0932  . tiZANidine (ZANAFLEX) tablet 4 mg  4 mg Oral QHS PRN Meredith Pel, NP      . traMADol Janean Sark) tablet 50 mg  50 mg Oral Q6H PRN Meredith Pel, NP   50 mg at 10/28/15 0935  . traZODone (DESYREL) tablet 25 mg  25 mg Oral QHS PRN Meredith Pel, NP   25 mg at 10/27/15 2132     Discharge Medications: Please see discharge summary for a list of discharge medications.  Relevant Imaging Results:  Relevant Lab Results:   Additional Information SS#: [id number]  Burna Sis, LCSW

## 2015-10-29 LAB — BASIC METABOLIC PANEL
Anion gap: 4 — ABNORMAL LOW (ref 5–15)
BUN: <5 mg/dL — ABNORMAL LOW (ref 6–20)
CO2: 26 mmol/L (ref 22–32)
Calcium: 8.2 mg/dL — ABNORMAL LOW (ref 8.9–10.3)
Chloride: 110 mmol/L (ref 101–111)
Creatinine, Ser: 1.12 mg/dL — ABNORMAL HIGH (ref 0.44–1.00)
GFR calc Af Amer: 55 mL/min — ABNORMAL LOW (ref >60)
GFR calc non Af Amer: 48 mL/min — ABNORMAL LOW (ref >60)
Glucose, Bld: 94 mg/dL (ref 65–99)
Potassium: 3.4 mmol/L — ABNORMAL LOW (ref 3.5–5.1)
Sodium: 140 mmol/L (ref 135–145)

## 2015-10-29 LAB — CBC
HCT: 36.7 % (ref 36.0–46.0)
Hemoglobin: 11.3 g/dL — ABNORMAL LOW (ref 12.0–15.0)
MCH: 27.4 pg (ref 26.0–34.0)
MCHC: 30.8 g/dL (ref 30.0–36.0)
MCV: 88.9 fL (ref 78.0–100.0)
Platelets: 70 10*3/uL — ABNORMAL LOW (ref 150–400)
RBC: 4.13 MIL/uL (ref 3.87–5.11)
RDW: 21.3 % — ABNORMAL HIGH (ref 11.5–15.5)
WBC: 7.8 10*3/uL (ref 4.0–10.5)

## 2015-10-29 MED ORDER — LOSARTAN POTASSIUM 50 MG PO TABS
50.0000 mg | ORAL_TABLET | Freq: Every day | ORAL | Status: DC
  Administered 2015-10-29 – 2015-10-30 (×2): 50 mg via ORAL
  Filled 2015-10-29 (×2): qty 1

## 2015-10-29 MED ORDER — POTASSIUM CHLORIDE CRYS ER 20 MEQ PO TBCR
40.0000 meq | EXTENDED_RELEASE_TABLET | Freq: Once | ORAL | Status: AC
  Administered 2015-10-29: 40 meq via ORAL
  Filled 2015-10-29: qty 2

## 2015-10-29 NOTE — Evaluation (Signed)
Physical Therapy Evaluation Patient Details Name: Gabrielle [name] MRN: [id number] DOB: 02-11-1946 Today's Date: 10/29/2015   History of Present Illness  70 y.o. female admitted to [facility] on for weakness and GI bleed.  Pt found to have severe anemia/(+) heme stools, s/p PRBCs Pt with PMHx of HTN, MI, COPD, A fib, TKA, CABG, and CAD.  s/p colonoscopy 10/24/15.   Clinical Impression  Pt admitted with above diagnosis. Pt currently with functional limitations due to the deficits listed below (see PT Problem List).  Pt will benefit from skilled PT to increase their independence and safety with mobility to allow discharge to the venue listed below.       Follow Up Recommendations SNF    Equipment Recommendations  None recommended by PT    Recommendations for Other Services       Precautions / Restrictions Precautions Precautions: Fall      Mobility  Bed Mobility Overal bed mobility: Needs Assistance Bed Mobility: Supine to Sit     Supine to sit: Min guard     General bed mobility comments: +rail with HOB 20; incr time and difficulty scooting to get feet on floor  Transfers Overall transfer level: Needs assistance Equipment used: Rolling walker (2 wheeled) Transfers: Sit to/from Stand Sit to Stand: Min guard         General transfer comment: x3 reps; vc for safe use of RW  Ambulation/Gait Ambulation/Gait assistance: Min guard Ambulation Distance (Feet): 150 Feet Assistive device: Rolling walker (2 wheeled) Gait Pattern/deviations: Step-through pattern;Decreased stride length;Trunk flexed Gait velocity: decreased   General Gait Details: flexed forward at her hips with RW too far ahead of her (reports it hurts her low back to stand upright); very slow--required repeated cues to incr velocity to improve her balance  Stairs            Wheelchair Mobility    Modified Rankin (Stroke Patients Only)       Balance Overall balance assessment: Needs  assistance Sitting-balance support: No upper extremity supported;Feet supported Sitting balance-Leahy Scale: Fair     Standing balance support: No upper extremity supported;During functional activity Standing balance-Leahy Scale: Fair Standing balance comment: standing for pericare                             Pertinent Vitals/Pain VSS per monitor   Pain Assessment: Faces Faces Pain Scale: Hurts little more Pain Location: low back Pain Descriptors / Indicators: Aching;Constant Pain Intervention(s): Limited activity within patient's tolerance;Repositioned;Patient requesting pain meds-RN notified    Home Living Family/patient expects to be discharged to:: Skilled nursing facility                      Prior Function Level of Independence: Needs assistance   Gait / Transfers Assistance Needed: recently using RW; prior to SNF for rehab, used cane           Hand Dominance   Dominant Hand: Right    Extremity/Trunk Assessment   Upper Extremity Assessment: Generalized weakness           Lower Extremity Assessment: Generalized weakness      Cervical / Trunk Assessment: Other exceptions  Communication   Communication: No difficulties  Cognition Arousal/Alertness: Awake/alert Behavior During Therapy: WFL for tasks assessed/performed Overall Cognitive Status: Within Functional Limits for tasks assessed  General Comments General comments (skin integrity, edema, etc.): Assisted pt on/off BSC. Pt very frustrated with nursing care with unit Director walking with us to hear patient's complaints. Their conversation continued once returned to room    Exercises        Assessment/Plan    PT Assessment Patient needs continued PT services  PT Diagnosis Difficulty walking;Generalized weakness   PT Problem List Decreased strength;Decreased activity tolerance;Decreased balance;Decreased mobility;Decreased knowledge of use of  DME;Decreased safety awareness;Obesity;Pain  PT Treatment Interventions DME instruction;Gait training;Stair training;Functional mobility training;Therapeutic activities;Balance training;Therapeutic exercise;Patient/family education   PT Goals (Current goals can be found in the Care Plan section) Acute Rehab PT Goals Patient Stated Goal: be able to ascend stairs to get into home PT Goal Formulation: With patient Time For Goal Achievement: 11/05/15 Potential to Achieve Goals: Good    Frequency Min 2X/week   Barriers to discharge        Co-evaluation               End of Session   Activity Tolerance: Patient limited by fatigue Patient left: in chair;with call bell/phone within reach;with chair alarm set;with nursing/sitter in room Nurse Communication: Mobility status         Time: 1016-1101 PT Time Calculation (min) (ACUTE ONLY): 45 min   Charges:   PT Evaluation $PT Eval Low Complexity: 1 Procedure PT Treatments $Gait Training: 8-22 mins $Therapeutic Activity: 8-22 mins   PT G Codes:        Ulanda Tackett 10/29/2015, 11:15 AM Pager [phone]

## 2015-10-29 NOTE — Progress Notes (Signed)
PROGRESS NOTE    LEGEND PECORE  ZOX:[id number] DOB: September 18, 1945 DOA: 10/27/2015 PCP: Gabrielle Saupe, MD   Chief Complaint  Patient presents with  . Hypotension  . GI Bleeding    Brief Narrative:  HPI on 10/27/2015 by Ms. Willette Cluster, NP Gabrielle [name] is a 70 y.o. female with a medical history significant for, but not  limited to, COPD, atrial fibrillation, coronary artery disease. Patient was discharged from the hospital 2 days ago after being admitted for hematochezia and hemoglobin of 4 on anticoagulation. Complete colonoscopy was done. Bowel prep was good. 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 The colon was diffusely friable with contact bleeding. Grade 1 hemorrhoids were found. Upper endoscopy was normal. Patient was transfused several units of blood, hemoglobin rose to 9.5 by time of hospital discharge on 8/25. She was discharged to [facility] and did fine without any recurrent bleeding for 2 days.. Today staff at SNF apparently noticed that patient was having recurrent hematochezia. EMS was called, patient found to be hypotensive with systolic blood pressure in the eighties. Patient has not had any anticoagulants since recent GI bleed. No abdominal pain but patient does complain of low back pain. No worsening of her chronic shortness of breath. No dizziness or chest pain.  Interim history Patient does have anemia and recurrent hematochezia likely secondary to thrombocytopenia as well as the use of Eliquis. Currently patient did not have any overt bleeding therefore bleeding scan was not done. Hemoglobin does appear to be stable at 11.3. GI consulted and following. We'll advanced diet today continue to monitor hemoglobin. Suspect discharge within 1-2 days.  Assessment & Plan   Recurrent hematochezia/Acute blood loss anemia/Symptomatic anemia -Upon Admission, patient was noted to be hypotensive.  She was given blood transfusion (2u) as well as IV fluid,  blood pressure has resolved. -Patient recently discharged a few days ago after GI bleed and severe anemia. She was continued on Eliquis. -Upon admission, hemoglobin was 8.5 -Currently hemoglobin 11.3 -Gastroenterology consulted and appreciated. Recommended bleeding scan if patient does develop profuse bleeding. Suspect diverticular bleed. Feels that patient does have bleeding tendency given that she is on anticoagulation and has thrombocytopenia. Should continue to hold these agents. Continue clear liquid diet. -continue to monitor CBC -Spoke with Dr. Dulce [name], will advance diet as tolerated.  Recommended holding Eliquis for a few weeks.    Atrial fibrillation -CHADSVASC 4 -Eliqius currently held due to the above -Continue Betapace  Hypertension -Patient presented with hypotension likely due to GI bleed   -Lasix has been held since last admission -We'll restart Cozaar today -Hypertension does seem to have resolved  Elevated lactic acid -Upon admission, lactic acid 2.56 -Currently afebrile with no leukocytosis -No complaints of cough, or urinary symptoms  -Possibly due to GI bleed. Lactic acid now has normalized, 1.8  COPD -Currently stable, no wheezing -Continue home regimen as well as Symbicort  CAD, remote CABG -Chest pain-free  GERD -Continue daily PPI  Anxiety -Continue Xanax twice daily as needed    Chronic systolic heart failure -Currently appears to be euvolemic -EF noted to be 45-50% per documentation 07/25/2015 -Lasix had last admission due to acute kidney injury -Renal function has normalized over patient did present with hypotension -Continue to monitor intake and output, daily weights  Recent QT prolongation -QT 515, currently 499.  -Celexa was discontinued during recent admission because of T prolongation, it was not resumed at discharge  Recent AKI/Chronic kidney disease, stage III -Seems to have resolved,  creatinine was up to 1.85 on previous admission (10/21/2015) -Currently creatinine 1.12 (stable) -Continue to monitor BMP  DVT Prophylaxis  SCDs  Code Status: Full  Family Communication: None at beside  Disposition Plan: Admitted, continue to monitor. Will transfer to tele floor today.  Consultants Gastroenterology  Procedures  None  Antibiotics   Anti-infectives    None      Subjective:   Gabrielle [name] seen and examined today.  Patient denies any further dark or bloody stools. Is concerned about being off of her Eliquis given her cardiac history. Currently denies any chest pain, shortness of breath, abdominal pain, nausea or vomiting, dizziness or headache.  Objective:   Vitals:   10/29/15 0400 10/29/15 0500 10/29/15 0600 10/29/15 0800  BP: (!) 140/51 (!) 141/54 (!) 151/64 (!) 149/57  Pulse: (!) 57 (!) 57 (!) 52 (!) 57  Resp: 18 18 15 19   Temp:    97.5 F (36.4 C)  TempSrc:    Oral  SpO2: 94% 91% 95% 94%  Weight:      Height:        Intake/Output Summary (Last 24 hours) at 10/29/15 1057 Last data filed at 10/29/15 0958  Gross per 24 hour  Intake             1030 ml  Output             2075 ml  Net            -1045 ml   Filed Weights   10/27/15 1149 10/27/15 2000 10/28/15 0245  Weight: 81.6 kg (180 lb) 90.4 kg (199 lb 4.7 oz) 90.3 kg (199 lb 1.2 oz)    Exam  General: Well developed, well nourished, NAD  HEENT: NCAT,  mucous membranes moist.   Cardiovascular: S1 S2 auscultated, Bradycardic  Respiratory: Clear to auscultation bilaterally with equal chest rise  Abdomen: Soft, nontender, nondistended, + bowel sounds  Extremities: warm dry without cyanosis clubbing or edema  Neuro: AAOx3, nonfocal  Psych: Appropriate mood and affect   Data Reviewed: I have personally reviewed following labs and imaging studies  CBC:  Recent Labs Lab 10/25/15 0226 10/27/15 1221 10/27/15 1223 10/27/15 1928 10/28/15 0149 10/29/15 0358  WBC 8.0 6.2  --   7.1 6.5 7.8  NEUTROABS  --  3.8  --   --   --   --   HGB 9.5* 8.5* 8.5* 10.8* 10.7* 11.3*  HCT 31.5* 28.6* 25.0* 35.2* 35.0* 36.7  MCV 89.2 91.4  --  88.7 89.1 88.9  PLT 76* 76*  --  69* 71* 70*   Basic Metabolic Panel:  Recent Labs Lab 10/24/15 0227 10/25/15 0226 10/27/15 1221 10/27/15 1223 10/28/15 0149 10/29/15 0358  NA 140 143 143 143 142 140  K 3.4* 3.4* 3.7 3.6 3.7 3.4*  CL 108 109 112* 106 113* 110  CO2 27 28 27   --  24 26  GLUCOSE 132* 115* 118* 111* 96 94  BUN 7 5* <5* <3* <5* <5*  CREATININE 1.37* 1.19* 1.07* 1.00 1.04* 1.12*  CALCIUM 8.5* 8.6* 8.3*  --  8.1* 8.2*  MG 1.4*  --   --   --   --   --    GFR: Estimated Creatinine Clearance: 47.4 mL/min (by C-G formula based on SCr of 1.12 mg/dL). Liver Function Tests:  Recent Labs Lab 10/27/15 1221  AST 32  ALT 10*  ALKPHOS 56  BILITOT 3.0*  PROT 4.3*  ALBUMIN 2.5*   No results for input(s): LIPASE, AMYLASE in the last 168 hours. No results for input(s): AMMONIA in the last 168 hours. Coagulation Profile:  Recent Labs Lab 10/22/15 1535 10/23/15 0722  INR 2.02 2.01   Cardiac Enzymes: No results for input(s): CKTOTAL, CKMB, CKMBINDEX, TROPONINI in the last 168 hours. BNP (last 3 results) No results for input(s): PROBNP in the last 8760 hours. HbA1C: No results for input(s): HGBA1C in the last 72 hours. CBG: No results for input(s): GLUCAP in the last 168 hours. Lipid Profile: No results for input(s): CHOL, HDL, LDLCALC, TRIG, CHOLHDL, LDLDIRECT in the last 72 hours. Thyroid Function Tests: No results for input(s): TSH, T4TOTAL, FREET4, T3FREE, THYROIDAB in the last 72 hours. Anemia Panel: No results for input(s): VITAMINB12, FOLATE, FERRITIN, TIBC, IRON, RETICCTPCT in the last 72 hours. Urine analysis:    Component Value Date/Time   COLORURINE YELLOW 10/21/2015 1742   APPEARANCEUR HAZY (A) 10/21/2015 1742   LABSPEC 1.018 10/21/2015 1742   PHURINE 5.0 10/21/2015 1742   GLUCOSEU NEGATIVE  10/21/2015 1742   HGBUR MODERATE (A) 10/21/2015 1742   BILIRUBINUR SMALL (A) 10/21/2015 1742   KETONESUR NEGATIVE 10/21/2015 1742   PROTEINUR 30 (A) 10/21/2015 1742   UROBILINOGEN 1.0 12/02/2010 1023   NITRITE NEGATIVE 10/21/2015 1742   LEUKOCYTESUR SMALL (A) 10/21/2015 1742   Sepsis Labs: @LABRCNTIP (procalcitonin:4,lacticidven:4)  ) Recent Results (from the past 240 hour(s))  MRSA PCR Screening     Status: Abnormal   Collection Time: 10/21/15  6:53 PM  Result Value Ref Range Status   MRSA by PCR POSITIVE (A) NEGATIVE Final    Comment:        The GeneXpert MRSA Assay (FDA approved for NASAL specimens only), is one component of a comprehensive MRSA colonization surveillance program. It is not intended to diagnose MRSA infection nor to guide or monitor treatment for MRSA infections. RESULT CALLED TO, READ BACK BY AND VERIFIED WITH: H RICHARD 10/21/15 @ 2133 M VESTAL   MRSA PCR Screening     Status: Abnormal   Collection Time: 10/27/15  8:05 PM  Result Value Ref Range Status   MRSA by PCR POSITIVE (A) NEGATIVE Final    Comment:        The GeneXpert MRSA Assay (FDA approved for NASAL specimens only), is one component of a comprehensive MRSA colonization surveillance program. It is not intended to diagnose MRSA infection nor to guide or monitor treatment for MRSA infections. RESULT CALLED TO, READ BACK BY AND VERIFIED WITH: NEILSON,T RN 2250 10/27/15 MITCHELL,L        Radiology Studies: No results found.   Scheduled Meds: . Chlorhexidine Gluconate Cloth  6 each Topical Q0600  . furosemide  20 mg Oral Daily  . mometasone-formoterol  2 puff Inhalation BID  . mupirocin ointment  1 application Nasal BID  . pantoprazole  40 mg Oral Daily  . potassium chloride  40 mEq Oral Once  . sodium chloride flush  3 mL Intravenous Q12H  . sotalol  80 mg Oral BID   Continuous Infusions:     LOS: 2 days   Time Spent in minutes   30 minutes  Hayes Rehfeldt D.O. on  10/29/2015 at 10:57 AM  Between 7am to 7pm - Pager - [phone]  After 7pm go to [email or web address] - password [email or web address]  And look for the night coverage person covering for me after hours  [facility] Office  [phone]

## 2015-10-29 NOTE — Plan of Care (Signed)
Problem: Education: Goal: Knowledge of disease or condition will improve Outcome: Progressing Discussed with patient about slight confusion upon awakening and how low her hemoglobin was and her fatigue levels with teach back displayed.

## 2015-10-29 NOTE — Progress Notes (Signed)
Subjective: Bleeding slowed down.  None since yesterday afternoon. No abdominal pain.  Objective: Vital signs in last 24 hours: Temp:  [97.5 F (36.4 C)-98.5 F (36.9 C)] 97.5 F (36.4 C) (08/29 0800) Pulse Rate:  [50-70] 57 (08/29 0800) Resp:  [13-26] 19 (08/29 0800) BP: (107-161)/(51-77) 149/57 (08/29 0800) SpO2:  [91 %-100 %] 94 % (08/29 0800) FiO2 (%):  [97 %] 97 % (08/29 0833) Weight change:  Last BM Date: 10/27/15  PE: GEN:  Overweight, chronically ill-appearing, deconditioned-appearing ABD:  Protuberant, soft.  Lab Results: CBC    Component Value Date/Time   WBC 7.8 10/29/2015 0358   RBC 4.13 10/29/2015 0358   HGB 11.3 (L) 10/29/2015 0358   HCT 36.7 10/29/2015 0358   PLT 70 (L) 10/29/2015 0358   MCV 88.9 10/29/2015 0358   MCH 27.4 10/29/2015 0358   MCHC 30.8 10/29/2015 0358   RDW 21.3 (H) 10/29/2015 0358   LYMPHSABS 1.4 10/27/2015 1221   MONOABS 0.7 10/27/2015 1221   EOSABS 0.2 10/27/2015 1221   BASOSABS 0.1 10/27/2015 1221   CMP     Component Value Date/Time   NA 140 10/29/2015 0358   K 3.4 (L) 10/29/2015 0358   CL 110 10/29/2015 0358   CO2 26 10/29/2015 0358   GLUCOSE 94 10/29/2015 0358   BUN <5 (L) 10/29/2015 0358   CREATININE 1.12 (H) 10/29/2015 0358   CALCIUM 8.2 (L) 10/29/2015 0358   PROT 4.3 (L) 10/27/2015 1221   ALBUMIN 2.5 (L) 10/27/2015 1221   AST 32 10/27/2015 1221   ALT 10 (L) 10/27/2015 1221   ALKPHOS 56 10/27/2015 1221   BILITOT 3.0 (H) 10/27/2015 1221   GFRNONAA 48 (L) 10/29/2015 0358   GFRAA 55 (L) 10/29/2015 0358   Assessment:  1.  Acute blood loss anemia.  Hgb slow uptrend. 2.  Recurrent hematochezia.  Recent hospitalization.  Recent endoscopy and colonoscopy.  Suspect diverticular. Continues to improve, has significantly slowed down. 3.  Bleeding tendency, on antiplatelet agents, thrombocytopenia and INR 2 (few days ago).  Certainly contributing to patient's bleeding tendency.  Plan:  1.  Off Eliquis for at least two  weeks, if clinically possible. 2.  Advance diet as tolerated. 3.  OOBTC, ambulate room/hall with assistance, as possible. 4.  No further GI testing planned; patient could likely be discharged from GI perspective within the next one day, if no further bleeding, and pending her physical therapy status. 5.  Will follow.   Freddy JakschOUTLAW,Mccall Lomax M 10/29/2015, 11:06 AM   Pager [phone] If no answer or after 5 PM call [phone]

## 2015-10-30 DIAGNOSIS — I10 Essential (primary) hypertension: Secondary | ICD-10-CM

## 2015-10-30 DIAGNOSIS — Z5181 Encounter for therapeutic drug level monitoring: Secondary | ICD-10-CM

## 2015-10-30 DIAGNOSIS — I4891 Unspecified atrial fibrillation: Secondary | ICD-10-CM

## 2015-10-30 DIAGNOSIS — D62 Acute posthemorrhagic anemia: Secondary | ICD-10-CM

## 2015-10-30 DIAGNOSIS — K922 Gastrointestinal hemorrhage, unspecified: Secondary | ICD-10-CM

## 2015-10-30 DIAGNOSIS — J449 Chronic obstructive pulmonary disease, unspecified: Secondary | ICD-10-CM

## 2015-10-30 DIAGNOSIS — E872 Acidosis: Secondary | ICD-10-CM

## 2015-10-30 LAB — BASIC METABOLIC PANEL
Anion gap: 7 (ref 5–15)
BUN: 7 mg/dL (ref 6–20)
CO2: 23 mmol/L (ref 22–32)
Calcium: 8.8 mg/dL — ABNORMAL LOW (ref 8.9–10.3)
Chloride: 110 mmol/L (ref 101–111)
Creatinine, Ser: 0.99 mg/dL (ref 0.44–1.00)
GFR calc Af Amer: >60 mL/min (ref >60)
GFR calc non Af Amer: 56 mL/min — ABNORMAL LOW (ref >60)
Glucose, Bld: 100 mg/dL — ABNORMAL HIGH (ref 65–99)
Potassium: 3.8 mmol/L (ref 3.5–5.1)
Sodium: 140 mmol/L (ref 135–145)

## 2015-10-30 LAB — CBC
HCT: 41.5 % (ref 36.0–46.0)
Hemoglobin: 12.8 g/dL (ref 12.0–15.0)
MCH: 27.5 pg (ref 26.0–34.0)
MCHC: 30.8 g/dL (ref 30.0–36.0)
MCV: 89.2 fL (ref 78.0–100.0)
Platelets: 74 10*3/uL — ABNORMAL LOW (ref 150–400)
RBC: 4.65 MIL/uL (ref 3.87–5.11)
RDW: 21.7 % — ABNORMAL HIGH (ref 11.5–15.5)
WBC: 9.2 10*3/uL (ref 4.0–10.5)

## 2015-10-30 MED ORDER — TRAMADOL HCL 50 MG PO TABS: 50.0000 mg | ORAL_TABLET | Freq: Four times a day (QID) | ORAL | 0 refills | Status: DC | PRN

## 2015-10-30 MED ORDER — ALPRAZOLAM 0.5 MG PO TABS: 0.5000 mg | ORAL_TABLET | Freq: Two times a day (BID) | ORAL | 0 refills | Status: DC | PRN

## 2015-10-30 MED ORDER — MUPIROCIN 2 % EX OINT: 1.0000 "application " | TOPICAL_OINTMENT | Freq: Two times a day (BID) | CUTANEOUS | 0 refills | Status: AC

## 2015-10-30 MED ORDER — POLYETHYLENE GLYCOL 3350 17 G PO PACK: 17.0000 g | PACK | Freq: Every day | ORAL | 0 refills | Status: DC | PRN

## 2015-10-30 NOTE — Clinical Social Work Placement (Signed)
   CLINICAL SOCIAL WORK PLACEMENT  NOTE  Date:  10/30/2015  Patient Details  Name: Roderic PalauVirginia W Qin MRN: [id number] Date of Birth: Jul 30, 1945  Clinical Social Work is seeking post-discharge placement for this patient at the Skilled  Nursing Facility level of care (*CSW will initial, date and re-position this form in  chart as items are completed):  Yes   Patient/family provided with [facility] Clinical Social Work Department's list of facilities offering this level of care within the geographic area requested by the patient (or if unable, by the patient's family).  Yes   Patient/family informed of their freedom to choose among providers that offer the needed level of care, that participate in Medicare, Medicaid or managed care program needed by the patient, have an available bed and are willing to accept the patient.  Yes   Patient/family informed of [facility]'s ownership interest in [facility] and [facility], as well as of the fact that they are under no obligation to receive care at these facilities.  PASRR submitted to EDS on       PASRR number received on       Existing PASRR number confirmed on 10/30/15     FL2 transmitted to all facilities in geographic area requested by pt/family on 10/28/15     FL2 transmitted to all facilities within larger geographic area on       Patient informed that his/her managed care company has contracts with or will negotiate with certain facilities, including the following:        Yes   Patient/family informed of bed offers received.  Patient [facility] bed at [facility]     Physician recommends and patient chooses bed at      [facility] on 10/30/15.  Patient to be transferred to facility by sister     Patient family notified on 10/30/15 of transfer.  Name of family member notified:  [name]     PHYSICIAN Please sign FL2     Additional Comment:     _______________________________________________ Burna SisUris, Margorie Renner H, LCSW 10/30/2015, 2:25 PM

## 2015-10-30 NOTE — Progress Notes (Signed)
Subjective: No further bleeding.  Objective: Vital signs in last 24 hours: Temp:  [97.5 F (36.4 C)-98.3 F (36.8 C)] 98.3 F (36.8 C) (08/30 0656) Pulse Rate:  [60-67] 60 (08/30 0656) Resp:  [15-19] 16 (08/30 0656) BP: (148-158)/(59-73) 158/73 (08/30 0656) SpO2:  [92 %-97 %] 97 % (08/30 0656) FiO2 (%):  [98 %] 98 % (08/29 2051) Weight:  [91.1 kg (200 lb 14.4 oz)-93.4 kg (206 lb)] 93.4 kg (206 lb) (08/30 0500) Weight change:  Last BM Date: 10/27/15  PE: GEN:  Overweight, deconditioned-appearing, NAD ABD:  Soft, protuberant  Lab Results: CBC    Component Value Date/Time   WBC 7.8 10/29/2015 0358   RBC 4.13 10/29/2015 0358   HGB 11.3 (L) 10/29/2015 0358   HCT 36.7 10/29/2015 0358   PLT 70 (L) 10/29/2015 0358   MCV 88.9 10/29/2015 0358   MCH 27.4 10/29/2015 0358   MCHC 30.8 10/29/2015 0358   RDW 21.3 (H) 10/29/2015 0358   LYMPHSABS 1.4 10/27/2015 1221   MONOABS 0.7 10/27/2015 1221   EOSABS 0.2 10/27/2015 1221   BASOSABS 0.1 10/27/2015 1221   CMP     Component Value Date/Time   NA 140 10/29/2015 0358   K 3.4 (L) 10/29/2015 0358   CL 110 10/29/2015 0358   CO2 26 10/29/2015 0358   GLUCOSE 94 10/29/2015 0358   BUN <5 (L) 10/29/2015 0358   CREATININE 1.12 (H) 10/29/2015 0358   CALCIUM 8.2 (L) 10/29/2015 0358   PROT 4.3 (L) 10/27/2015 1221   ALBUMIN 2.5 (L) 10/27/2015 1221   AST 32 10/27/2015 1221   ALT 10 (L) 10/27/2015 1221   ALKPHOS 56 10/27/2015 1221   BILITOT 3.0 (H) 10/27/2015 1221   GFRNONAA 48 (L) 10/29/2015 0358   GFRAA 55 (L) 10/29/2015 0358   Assessment:  1. Acute blood loss anemia.  Hgb slow uptrend. 2. Recurrent hematochezia. Recent hospitalization. Recent endoscopy and colonoscopy. Suspect diverticular.Seems to have resolved over the past few days. 3. Bleeding tendency, on antiplatelet agents, thrombocytopenia. Certainly contributing to patient's bleeding tendency.  Plan:  1.  Advance diet as tolerated. 2.  No clopidigrel or other  antiplatelet/anticoagulants for the next 2-3 weeks, if at all clinically possible. 3.  No further GI tract intervention needed. 4.  OOBTC, ambulate halls, promote functional mobility as tolerated, probably patient's hospital-limiting factor now. 5.  Will sign-off; please call with questions; will arrange outpatient GI follow with [facility]; thank you for the consultation.   Gabrielle JakschOUTLAW,Gabrielle [name] M 10/30/2015, 9:10 AM   Pager [phone] If no answer or after 5 PM call [phone]

## 2015-10-30 NOTE — Progress Notes (Signed)
Lab will redraw blood for lab because they reported that the blood drawn was clotted up. Will continue to monitor.

## 2015-10-30 NOTE — Discharge Instructions (Signed)
Blood Transfusion, Care After These instructions give you information about caring for yourself after your procedure. Your doctor may also give you more specific instructions. Call your doctor if you have any problems or questions after your procedure.  HOME CARE  Take medicines only as told by your doctor. Ask your doctor if you can take an over-the-counter pain reliever if you have a fever or headache a day or two after your procedure.  Return to your normal activities as told by your doctor. GET HELP IF:   You develop redness or irritation at your IV site.  You have a fever, chills, or a headache that does not go away.  Your pee (urine) is darker than normal.  Your urine turns:  Pink.  Red.  Owens Shark.  The white part of your eye turns yellow (jaundice).  You feel weak after doing your normal activities. GET HELP RIGHT AWAY IF:   You have trouble breathing.  You have fever and chills and you also have:  Anxiety.  Chest or back pain.  Flushed or pink skin.  Clammy or sweaty skin.  A fast heartbeat.  A sick feeling in your stomach (nausea).   This information is not intended to replace advice given to you by your health care provider. Make sure you discuss any questions you have with your health care provider.   Document Released: 03/09/2014 Document Reviewed: 03/09/2014 Elsevier Interactive Patient Education 2016 [employer].  Anemia, Nonspecific Anemia is a condition in which the concentration of red blood cells or hemoglobin in the blood is below normal. Hemoglobin is a substance in red blood cells that carries oxygen to the tissues of the body. Anemia results in not enough oxygen reaching these tissues.  CAUSES  Common causes of anemia include:   Excessive bleeding. Bleeding may be internal or external. This includes excessive bleeding from periods (in women) or from the intestine.   Poor nutrition.   Chronic kidney, thyroid, and liver disease.  Bone  marrow disorders that decrease red blood cell production.  Cancer and treatments for cancer.  HIV, AIDS, and their treatments.  Spleen problems that increase red blood cell destruction.  Blood disorders.  Excess destruction of red blood cells due to infection, medicines, and autoimmune disorders. SIGNS AND SYMPTOMS   Minor weakness.   Dizziness.   Headache.  Palpitations.   Shortness of breath, especially with exercise.   Paleness.  Cold sensitivity.  Indigestion.  Nausea.  Difficulty sleeping.  Difficulty concentrating. Symptoms may occur suddenly or they may develop slowly.  DIAGNOSIS  Additional blood tests are often needed. These help your health care provider determine the best treatment. Your health care provider will check your stool for blood and look for other causes of blood loss.  TREATMENT  Treatment varies depending on the cause of the anemia. Treatment can include:   Supplements of iron, vitamin P32, or folic acid.   Hormone medicines.   A blood transfusion. This may be needed if blood loss is severe.   Hospitalization. This may be needed if there is significant continual blood loss.   Dietary changes.  Spleen removal. HOME CARE INSTRUCTIONS Keep all follow-up appointments. It often takes many weeks to correct anemia, and having your health care provider check on your condition and your response to treatment is very important. SEEK IMMEDIATE MEDICAL CARE IF:   You develop extreme weakness, shortness of breath, or chest pain.   You become dizzy or have trouble concentrating.  You develop heavy vaginal  bleeding.   You develop a rash.   You have bloody or black, tarry stools.   You faint.   You vomit up blood.   You vomit repeatedly.   You have abdominal pain.  You have a fever or persistent symptoms for more than 2-3 days.   You have a fever and your symptoms suddenly get worse.   You are dehydrated.  MAKE  SURE YOU:  Understand these instructions.  Will watch your condition.  Will get help right away if you are not doing well or get worse.   This information is not intended to replace advice given to you by your health care provider. Make sure you discuss any questions you have with your health care provider.   Document Released: 03/26/2004 Document Revised: 10/19/2012 Document Reviewed: 08/12/2012 Elsevier Interactive Patient Education 2016 Elsevier Inc.   Gastrointestinal Bleeding Gastrointestinal bleeding is bleeding somewhere along the path that food travels through the body (digestive tract). This path is anywhere between the mouth and the opening of the butt (anus). You may have blood in your throw up (vomit) or in your poop (stools). If there is a lot of bleeding, you may need to stay in the hospital. HOME CARE  Only take medicine as told by your doctor.  Eat foods with fiber such as whole grains, fruits, and vegetables. You can also try eating 1 to 3 prunes a day.  Drink enough fluids to keep your pee (urine) clear or pale yellow. GET HELP RIGHT AWAY IF:   Your bleeding gets worse.  You feel dizzy, weak, or you pass out (faint).  You have bad cramps in your back or belly (abdomen).  You have large blood clumps (clots) in your poop.  Your problems are getting worse. MAKE SURE YOU:   Understand these instructions.  Will watch your condition.  Will get help right away if you are not doing well or get worse.   This information is not intended to replace advice given to you by your health care provider. Make sure you discuss any questions you have with your health care provider.   Document Released: 11/26/2007 Document Revised: 02/03/2012 Document Reviewed: 08/06/2014 Elsevier Interactive Patient Education 2016 Elsevier Inc.  Gastrointestinal Bleeding Scan A gastrointestinal bleeding scan is a procedure used to locate sites of bleeding in the bowel or abdomen. You  may need this scan if you have symptoms of gastrointestinal bleeding, such as blood in your stool (feces). The scan may also be used before a surgery done to correct bleeding in these areas. It helps the surgeon find the location of bleeding. In this procedure, a small amount of a radioactive material (tracer) is injected into your blood. A scanner with a camera that detects the radioactive tracer is used to see if any of the material gets into your abdomen or bowel. If it does, this indicates the site of bleeding. LET [facility] PROVIDER KNOW ABOUT:   Any allergies you have.  All medicines you are taking, including vitamins, herbs, eye drops, creams, and over-the-counter medicines.  Any blood disorders you have.  Previous surgeries you have had.  Medical conditions you have.  If you are pregnant or think you may be pregnant.  If you are breastfeeding. RISKS AND COMPLICATIONS  Generally, this is a safe procedure. However, problems may occur, including:  Exposure to radiation (small amount).  Allergic reaction to the radioactive material. This is rare. BEFORE THE PROCEDURE   Ask your health care provider about  changing or stopping your regular medicines. This is especially important if you are taking diabetes medicines or blood thinners. PROCEDURE   An IV tube will be inserted into one of your veins.  A small amount of radioactive tracer will be injected through the tube. In some cases, some of your blood may be drawn and mixed with the radioactive tracer before it is injected through the tube.  As the radioactive tracer travels through your bloodstream, images of your abdominal area will be taken. These images will be taken at short intervals of 5-15 minutes during the procedure. If the images show radioactive tracer in the abdomen or bowel, this indicates the site of bleeding.  Additional images may be taken every hour for up to 24 hours after the injection of the radioactive  tracer. This may be needed to help identify a site of slow bleeding or bleeding that comes and goes. The procedure may vary among health care providers and hospitals. AFTER THE PROCEDURE  Return to your normal activities and diet as directed by your health care provider.  The radioactive tracer will leave your body over the next few days. Drink enough fluid to keep your urine clear or pale yellow. This will help flush the tracer out of your body.  It is your responsibility to obtain your test results. Ask your health care provider or the department performing the test when and how you will get your results.   This information is not intended to replace advice given to you by your health care provider. Make sure you discuss any questions you have with your health care provider.   Document Released: 04/07/2005 Document Revised: 03/09/2014 Document Reviewed: 11/28/2013 Elsevier Interactive Patient Education [employer]

## 2015-10-30 NOTE — Discharge Summary (Signed)
Physician Discharge Summary  Roderic PalauVirginia W Scibilia AOZ:[id number] DOB: [birth year] DOA: 10/27/2015  PCP: Cain SaupeFULP, CAMMIE, MD  Admit date: 10/27/2015 Discharge date: 10/30/2015  Disposition:  SNF  Recommendations for Outpatient Follow-up:  1. Follow up with PCP in 1-2 weeks 2. Follow up with Dr Anne FuSkains in 2 weeks to discuss restarting eliquis 3. Please obtain BMP/CBC in one week 4. GI advised to stay off eliquis for at least 2 weeks before restarting  Discharge Condition:  Stable  CODE STATUS:FULL  Brief/Interim Summary: HPI on 10/27/2015 by Ms. Willette ClusterPaula Guenther, NP [place]ia W Claytonis a 70 y.o.femalewith a medical history significant for, but not limited to, COPD, atrial fibrillation, coronary artery disease. Patient was discharged from the hospital 2 days ago after being admitted for hematochezia and hemoglobin of 4 on anticoagulation. Complete colonoscopy was done. Bowel prep was good. 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 The colon was diffusely friable with contact bleeding. Grade 1 hemorrhoids were found. Upper endoscopy was normal. Patient was transfused several units of blood, hemoglobin rose to 9.5 by time of hospital discharge on 8/25. [facility] and did fine without any recurrent bleeding for 2 days.. Today staff at SNF apparently noticed that patient was having recurrent hematochezia. EMS was called, patient found to be hypotensive with systolic blood pressure in the eighties. Patient has not had any anticoagulants since recent GI bleed. No abdominal pain but patient does complain of low back pain. No worsening of her chronic shortness of breath. No dizziness or chest pain.  Interim history Patient does have anemia and recurrent hematochezia likely secondary to thrombocytopenia as well as the use of Eliquis. Currently patient did not have any overt bleeding therefore bleeding scan was not done. Hemoglobin does appear to be stable at 11.3.  GI consulted and following.  Assessment & Plan   Recurrent hematochezia/Acute blood loss anemia/Symptomatic anemia -Upon Admission, patient was noted to be hypotensive.  She was given blood transfusion (2u) as well as IV fluid, blood pressure has resolved. -Patient recently discharged a few days ago after GI bleed and severe anemia. She was continued on Eliquis. -Upon admission, hemoglobin was 8.5 -Currently hemoglobin 12  -Gastroenterology consulted and appreciated. Recommended bleeding scan if patient does develop profuse bleeding. Suspect diverticular bleed. Feels that patient does have bleeding tendency given that she is on anticoagulation and has thrombocytopenia. Should continue to hold these agents. Continue clear liquid diet. -continue to monitor CBC -Spoke with Dr. Dulce Sellarutlaw, will advance diet as tolerated.  Recommended holding Eliquis for a few weeks.  GI noted below:  Assessment:  1. Acute blood loss anemia. Hgb slow uptrend. 2. Recurrent hematochezia. Recent hospitalization. Recent endoscopy and colonoscopy. Suspect diverticular.Seems to have resolved over the past few days. 3. Bleeding tendency, on antiplatelet agents, thrombocytopenia. Certainly contributing to patient's bleeding tendency.  Plan:  1.  Advance diet as tolerated. 2.  No clopidigrel or other antiplatelet/anticoagulants for the next 2-3 weeks, if at all clinically possible. 3.  No further GI tract intervention needed. 4.  OOBTC, ambulate halls, promote functional mobility as tolerated, probably patient's hospital-limiting factor now. 5.  Will sign-off; please call with questions; will arrange outpatient GI follow with [facility]; thank you for the consultation.  Atrial fibrillation -CHADSVASC 4 -Eliqius currently held due to the above, Plan to hold for next 2 weeks before restarting.   -Continue Betapace  Hypertension -Patient presented with hypotension likely due to GI bleed  -Lasix has been  held since  last admission -Restarted home losartan -Hypertension controlled  Elevated lactic acid -Upon admission, lactic acid 2.56 -Currently afebrile with no leukocytosis -No complaints of cough, or urinary symptoms  -Possibly due to GI bleed. Lactic acid now has normalized, 1.8  COPD -Currently stable, no wheezing -Continue home regimen as well as Symbicort  CAD, remote CABG -Chest pain-free  GERD -Continue daily PPI  Anxiety -Continue Xanax twice daily as needed   Chronic systolic heart failure -Currently appears to be euvolemic -EF noted to be 45-50% per documentation 07/25/2015 -Lasix held last admission due to acute kidney injury -Renal function has normalized over patient did present with hypotension -Continue to monitor intake and output, daily weights  Recent QT prolongation -QT 515, currently 499.  -Celexa was discontinued during recent admission because of T prolongation, it was not resumed at discharge  Recent AKI/Chronic kidney disease, stage III -Seems to have resolved, creatinine was up to 1.85 on previous admission (10/21/2015) -Currently creatinine 1.12 (stable) -Continue to monitor BMP  DVT Prophylaxis  SCDs  Code Status: Full  Family Communication: None at beside  Disposition Plan: SNF  Consultants Gastroenterology  Procedures  None  Discharge Diagnoses:  Active Problems:   Essential hypertension   COPD (chronic obstructive pulmonary disease) (HCC)   Atrial fibrillation with controlled ventricular response (HCC)   Encounter for therapeutic drug monitoring   Gastrointestinal hemorrhage   Anemia due to blood loss, acute   Elevated lactic acid level   Systolic heart failure [facility])  Discharge Instructions  Discharge Instructions    Increase activity slowly    Complete by:  As directed       Medication List    TAKE these medications   albuterol 108 (90 Base) MCG/ACT inhaler Commonly known as:  PROAIR HFA INHALE 2  PUFFS INTO THE LUNGS EVERY 6 (SIX) HOURS AS NEEDED. What changed:  how much to take  how to take this  when to take this  reasons to take this  additional instructions   ALPRAZolam 0.5 MG tablet Commonly known as:  XANAX Take 1 tablet (0.5 mg total) by mouth 2 (two) times daily as needed for anxiety.   atorvastatin 20 MG tablet Commonly known as:  LIPITOR TAKE 1 TABLET (20 MG TOTAL) BY MOUTH DAILY. What changed:  how much to take  how to take this  when to take this  additional instructions   budesonide-formoterol 160-4.5 MCG/ACT inhaler Commonly known as:  SYMBICORT Inhale 2 puffs into the lungs 2 (two) times daily.   clobetasol cream 0.05 % Commonly known as:  TEMOVATE Apply 1 application topically daily as needed (itching).   furosemide 20 MG tablet Commonly known as:  LASIX Take 1 tablet (20 mg total) by mouth daily. May take additional 20 mg a day for swelling as needed What changed:  when to take this  additional instructions   losartan 50 MG tablet Commonly known as:  COZAAR Take 1 tablet (50 mg total) by mouth daily.   mupirocin ointment 2 % Commonly known as:  BACTROBAN Place 1 application into the nose 2 (two) times daily.   oxymetazoline 0.05 % nasal spray Commonly known as:  AFRIN Place 2 sprays into both nostrils daily as needed for congestion.   pantoprazole 40 MG tablet Commonly known as:  PROTONIX Take 40 mg by mouth daily before breakfast.   polyethylene glycol packet Commonly known as:  MIRALAX / GLYCOLAX Take 17 g by mouth daily as needed for mild constipation.   sotalol 80 MG  tablet Commonly known as:  BETAPACE Take 1 tablet (80 mg total) by mouth 2 (two) times daily.   SYSTANE ULTRA 0.4-0.3 % Soln Generic drug:  Polyethyl Glycol-Propyl Glycol Place 1 drop into both eyes daily as needed (for dry eyes).   tiZANidine 4 MG tablet Commonly known as:  ZANAFLEX Take 4 mg by mouth at bedtime as needed for muscle spasms.    traMADol 50 MG tablet Commonly known as:  ULTRAM Take 1 tablet (50 mg total) by mouth every 6 (six) hours as needed for moderate pain.      Follow-up Information    FULP, CAMMIE, MD. Schedule an appointment as soon as possible for a visit in 1 week(s).   Specialty:  Family Medicine Why:  Hospital Follow Up Contact information: [address] [place] [address] [phone]        Freddy Jaksch, MD. Schedule an appointment as soon as possible for a visit in 3 week(s).   Specialty:  Gastroenterology Why:  Hospital Follow Up Contact information: [address] [place] [address] [phone]        Donato Schultz, MD. Schedule an appointment as soon as possible for a visit in 2 week(s).   Specialty:  Cardiology Why:  Hospital follow up, discuss restarting Eliquis Contact information: [address] [place] [address] [phone]          Allergies  Allergen Reactions  . Novocain [Procaine] Hives and Other (See Comments)    palpitations  . Penicillins Hives    Has patient had a PCN reaction causing immediate rash, facial/tongue/throat swelling, SOB or lightheadedness with hypotension: Yes Has patient had a PCN reaction causing severe rash involving mucus membranes or skin necrosis: No Has patient had a PCN reaction that required hospitalization No Has patient had a PCN reaction occurring within the last 10 years: No If all of the above answers are "NO", then may proceed with Cephalosporin use.    Procedures/Studies: No results found.   Subjective: Pt reports that she is feeling better, had large BM with no blood today.   Discharge Exam: Vitals:   10/30/15 0656 10/30/15 1050  BP: (!) 158/73 (!) 131/48  Pulse: 60 60  Resp: 16 16  Temp: 98.3 F (36.8 C)    Vitals:   10/29/15 2058 10/30/15 0500 10/30/15 0656 10/30/15 1050  BP: (!) 150/63  (!) 158/73 (!) 131/48  Pulse: 67  60 60  Resp: 17  16 16   Temp: 97.8 F  (36.6 C)  98.3 F (36.8 C)   TempSrc:   Oral   SpO2: 96%  97% 94%  Weight:  93.4 kg (206 lb)    Height:        General: Well developed, well nourished, NAD  HEENT: NCAT,  mucous membranes moist.   Cardiovascular: S1 S2 auscultated,   Respiratory: Clear to auscultation bilaterally with equal chest rise  Abdomen: Soft, nontender, nondistended, + bowel sounds  Extremities: warm dry without cyanosis clubbing or edema  Neuro: AAOx3, nonfocal  Psych: Appropriate mood and affect   The results of significant diagnostics from this hospitalization (including imaging, microbiology, ancillary and laboratory) are listed below for reference.     Microbiology: Recent Results (from the past 240 hour(s))  MRSA PCR Screening     Status: Abnormal   Collection Time: 10/21/15  6:53 PM  Result Value Ref Range Status   MRSA by PCR POSITIVE (A) NEGATIVE Final    Comment:  The GeneXpert MRSA Assay (FDA approved for NASAL specimens only), is one component of a comprehensive MRSA colonization surveillance program. It is not intended to diagnose MRSA infection nor to guide or monitor treatment for MRSA infections. RESULT CALLED TO, READ BACK BY AND VERIFIED WITH: H RICHARD 10/21/15 @ 2133 M VESTAL   MRSA PCR Screening     Status: Abnormal   Collection Time: 10/27/15  8:05 PM  Result Value Ref Range Status   MRSA by PCR POSITIVE (A) NEGATIVE Final    Comment:        The GeneXpert MRSA Assay (FDA approved for NASAL specimens only), is one component of a comprehensive MRSA colonization surveillance program. It is not intended to diagnose MRSA infection nor to guide or monitor treatment for MRSA infections. RESULT CALLED TO, READ BACK BY AND VERIFIED WITH: NEILSON,T RN 2250 10/27/15 MITCHELL,L       Labs: BNP (last 3 results) No results for input(s): BNP in the last 8760 hours. Basic Metabolic Panel:  Recent Labs Lab 10/24/15 0227 10/25/15 0226 10/27/15 1221  10/27/15 1223 10/28/15 0149 10/29/15 0358 10/30/15 1034  NA 140 143 143 143 142 140 140  K 3.4* 3.4* 3.7 3.6 3.7 3.4* 3.8  CL 108 109 112* 106 113* 110 110  CO2 27 28 27   --  24 26 23   GLUCOSE 132* 115* 118* 111* 96 94 100*  BUN 7 5* <5* <3* <5* <5* 7  CREATININE 1.37* 1.19* 1.07* 1.00 1.04* 1.12* 0.99  CALCIUM 8.5* 8.6* 8.3*  --  8.1* 8.2* 8.8*  MG 1.4*  --   --   --   --   --   --    Liver Function Tests:  Recent Labs Lab 10/27/15 1221  AST 32  ALT 10*  ALKPHOS 56  BILITOT 3.0*  PROT 4.3*  ALBUMIN 2.5*   No results for input(s): LIPASE, AMYLASE in the last 168 hours. No results for input(s): AMMONIA in the last 168 hours. CBC:  Recent Labs Lab 10/27/15 1221 10/27/15 1223 10/27/15 1928 10/28/15 0149 10/29/15 0358 10/30/15 1034  WBC 6.2  --  7.1 6.5 7.8 9.2  NEUTROABS 3.8  --   --   --   --   --   HGB 8.5* 8.5* 10.8* 10.7* 11.3* 12.8  HCT 28.6* 25.0* 35.2* 35.0* 36.7 41.5  MCV 91.4  --  88.7 89.1 88.9 89.2  PLT 76*  --  69* 71* 70* 74*   Cardiac Enzymes: No results for input(s): CKTOTAL, CKMB, CKMBINDEX, TROPONINI in the last 168 hours. BNP: Invalid input(s): POCBNP CBG: No results for input(s): GLUCAP in the last 168 hours. D-Dimer No results for input(s): DDIMER in the last 72 hours. Hgb A1c No results for input(s): HGBA1C in the last 72 hours. Lipid Profile No results for input(s): CHOL, HDL, LDLCALC, TRIG, CHOLHDL, LDLDIRECT in the last 72 hours. Thyroid function studies No results for input(s): TSH, T4TOTAL, T3FREE, THYROIDAB in the last 72 hours.  Invalid input(s): FREET3 Anemia work up No results for input(s): VITAMINB12, FOLATE, FERRITIN, TIBC, IRON, RETICCTPCT in the last 72 hours. Urinalysis    Component Value Date/Time   COLORURINE YELLOW 10/21/2015 1742   APPEARANCEUR HAZY (A) 10/21/2015 1742   LABSPEC 1.018 10/21/2015 1742   PHURINE 5.0 10/21/2015 1742   GLUCOSEU NEGATIVE 10/21/2015 1742   HGBUR MODERATE (A) 10/21/2015 1742    BILIRUBINUR SMALL (A) 10/21/2015 1742   KETONESUR NEGATIVE 10/21/2015 1742   PROTEINUR 30 (A) 10/21/2015 1742  UROBILINOGEN 1.0 12/02/2010 1023   NITRITE NEGATIVE 10/21/2015 1742   LEUKOCYTESUR SMALL (A) 10/21/2015 1742   Sepsis Labs Invalid input(s): PROCALCITONIN,  WBC,  LACTICIDVEN Microbiology Recent Results (from the past 240 hour(s))  MRSA PCR Screening     Status: Abnormal   Collection Time: 10/21/15  6:53 PM  Result Value Ref Range Status   MRSA by PCR POSITIVE (A) NEGATIVE Final    Comment:        The GeneXpert MRSA Assay (FDA approved for NASAL specimens only), is one component of a comprehensive MRSA colonization surveillance program. It is not intended to diagnose MRSA infection nor to guide or monitor treatment for MRSA infections. RESULT CALLED TO, READ BACK BY AND VERIFIED WITH: H RICHARD 10/21/15 @ 2133 M VESTAL   MRSA PCR Screening     Status: Abnormal   Collection Time: 10/27/15  8:05 PM  Result Value Ref Range Status   MRSA by PCR POSITIVE (A) NEGATIVE Final    Comment:        The GeneXpert MRSA Assay (FDA approved for NASAL specimens only), is one component of a comprehensive MRSA colonization surveillance program. It is not intended to diagnose MRSA infection nor to guide or monitor treatment for MRSA infections. RESULT CALLED TO, READ BACK BY AND VERIFIED WITH: NEILSON,T RN 2250 10/27/15 MITCHELL,L     Time coordinating discharge: 33 minutes  SIGNED:   Standley Dakins, MD  [facility] 10/30/2015, 1:57 PM Pager   If 7PM-7AM, please contact night-coverage [email or web address] Password [email or web address]

## 2015-10-30 NOTE — Progress Notes (Signed)
Patient will discharge to [facility] Anticipated discharge date: 8/30 Family notified: pt sister  Transportation by pt sister  CSW signing off.  Burna SisJenna H. Yvonnia Tango, LCSWA Clinical Social Worker [phone]

## 2015-10-30 NOTE — Progress Notes (Signed)
10/30/15  0919  CBC reordered. Per lab original CBC sample had clotted.

## 2015-10-31 ENCOUNTER — Non-Acute Institutional Stay (SKILLED_NURSING_FACILITY): Payer: Medicare Other | Admitting: Internal Medicine

## 2015-10-31 ENCOUNTER — Telehealth: Payer: Self-pay | Admitting: Cardiology

## 2015-10-31 ENCOUNTER — Encounter: Payer: Self-pay | Admitting: Internal Medicine

## 2015-10-31 DIAGNOSIS — M62838 Other muscle spasm: Secondary | ICD-10-CM | POA: Diagnosis not present

## 2015-10-31 DIAGNOSIS — R5381 Other malaise: Secondary | ICD-10-CM | POA: Diagnosis not present

## 2015-10-31 DIAGNOSIS — K5900 Constipation, unspecified: Secondary | ICD-10-CM | POA: Diagnosis not present

## 2015-10-31 DIAGNOSIS — I480 Paroxysmal atrial fibrillation: Secondary | ICD-10-CM | POA: Diagnosis not present

## 2015-10-31 DIAGNOSIS — E785 Hyperlipidemia, unspecified: Secondary | ICD-10-CM | POA: Diagnosis not present

## 2015-10-31 DIAGNOSIS — D696 Thrombocytopenia, unspecified: Secondary | ICD-10-CM

## 2015-10-31 DIAGNOSIS — K922 Gastrointestinal hemorrhage, unspecified: Secondary | ICD-10-CM

## 2015-10-31 DIAGNOSIS — I5022 Chronic systolic (congestive) heart failure: Secondary | ICD-10-CM | POA: Diagnosis not present

## 2015-10-31 DIAGNOSIS — N183 Chronic kidney disease, stage 3 unspecified: Secondary | ICD-10-CM

## 2015-10-31 DIAGNOSIS — R600 Localized edema: Secondary | ICD-10-CM

## 2015-10-31 DIAGNOSIS — F419 Anxiety disorder, unspecified: Secondary | ICD-10-CM | POA: Diagnosis not present

## 2015-10-31 DIAGNOSIS — E46 Unspecified protein-calorie malnutrition: Secondary | ICD-10-CM

## 2015-10-31 DIAGNOSIS — I10 Essential (primary) hypertension: Secondary | ICD-10-CM | POA: Diagnosis not present

## 2015-10-31 LAB — TYPE AND SCREEN
ABO/RH(D): O POS
Antibody Screen: POSITIVE
DAT, IgG: NEGATIVE
Donor AG Type: NEGATIVE
Donor AG Type: NEGATIVE
Donor AG Type: NEGATIVE
Donor AG Type: NEGATIVE
Unit division: 0
Unit division: 0
Unit division: 0
Unit division: 0

## 2015-10-31 NOTE — Telephone Encounter (Signed)
Lm to cb to discuss concerns 

## 2015-10-31 NOTE — Telephone Encounter (Signed)
Spoke with pt at length RE: recent hospitalization.  She is concerned she is not receiving the correct medications where she is now.  Advised her medications have been reviewed and are ordered appropriately.  She admits she had been given her medications this AM but forgot she had taken them.  Reassured pt of the care she is received there.  Scheduled her f/u appt with Dr Anne FuSkains.  She had no further questins and seemed to be more at ease at the end of our conversation.

## 2015-10-31 NOTE — Progress Notes (Signed)
[facility]ATION[facility]  PCP: Cain SaupeFULP, CAMMIE, MD   Code Status: Full Code  Goals of care: Advanced Directive information Advanced Directives 10/27/2015  Does patient have an advance directive? No  Would patient like information on creating an advanced directive? No - patient declined information       Extended Emergency Contact Information Primary Emergency Contact: Wise,[name] Address: [address]          [place], [place] [id number] [place] Home Phone: [phone] Mobile Phone: [phone] Relation: Sister Secondary Emergency Contact: Saraceno,Bruce Address: [address]          [place], [place] [address] [place] Mobile Phone: [phone] Relation: Son   Allergies  Allergen Reactions  . Novocain [Procaine] Hives and Other (See Comments)    palpitations  . Penicillins Hives    Has patient had a PCN reaction causing immediate rash, facial/tongue/throat swelling, SOB or lightheadedness with hypotension: Yes Has patient had a PCN reaction causing severe rash involving mucus membranes or skin necrosis: No Has patient had a PCN reaction that required hospitalization No Has patient had a PCN reaction occurring within the last 10 years: No If all of the above answers are "NO", then may proceed with Cephalosporin use.    Chief Complaint  Patient presents with  . New Admit To SNF    New Admission     HPI:  Patient is a 70 y.o. female seen today for short term rehabilitation post hospital admission from 10/27/15-10/30/15 with hematochezia thought to be from diverticular bleed. She required 2 u prbc transfusion and iv fluids. She is pending outpatient GI follow up. Her anticoagulation was held in the hospital. She is seen in her room today. She has PMH of HTN, COPD, CAD, chronic systolic CHF among others.   Review of Systems:  Constitutional: Negative for fever, chills, diaphoresis. Feels weak and tired. HENT: Negative for headache, congestion,  nasal discharge, difficulty swallowing.   Eyes: Negative for blurred vision, double vision and discharge.  Respiratory: Negative for cough, shortness of breath and wheezing.   Cardiovascular: Negative for chest pain, palpitations, leg swelling.  Gastrointestinal: Negative for heartburn, nausea, vomiting, abdominal pain. Last bowel movement was yesterday. Positive for some abdominal discomfort. Genitourinary: Negative for dysuria and flank pain.  Musculoskeletal: Negative for back pain, fall in the facility.  Skin: Negative for itching, rash.  Neurological: Positive for occasional dizziness Psychiatric/Behavioral: Negative for depression   Past Medical History:  Diagnosis Date  . Anemia   . Arthritis   . Asthma   . Atrial fibrillation (HCC)   . Atrial fibrillation with controlled ventricular response (HCC) 11/2009  . Chronic bronchitis   . Chronic headache   . COPD (chronic obstructive pulmonary disease) (HCC)   . Depression   . Heart attack (HCC)   . Hyperlipidemia   . Hypertension    Past Surgical History:  Procedure Laterality Date  . CAD-CABG     x7, brief post-op Atrial Fib  . CESAREAN SECTION     x2  . COLONOSCOPY WITH PROPOFOL N/A 10/24/2015   Procedure: COLONOSCOPY WITH PROPOFOL;  Surgeon: Carman ChingJames Edwards, MD;  [facility];  Service: Endoscopy;  Laterality: N/A;  . CORONARY ARTERY BYPASS GRAFT  2011  . ESOPHAGOGASTRODUODENOSCOPY N/A 10/22/2015   Procedure: ESOPHAGOGASTRODUODENOSCOPY (EGD);  Surgeon: Carman ChingJames Edwards, MD;  [facility];  Service: Endoscopy;  Laterality: N/A;  . JOINT REPLACEMENT Bilateral   . TONSILLECTOMY    . TOTAL KNEE ARTHROPLASTY  Social History:   reports that she has quit smoking. Her smoking use included Cigarettes. She smoked 0.20 packs per day. She has never used smokeless tobacco. She reports that she does not drink alcohol or use drugs.  Family History  Problem Relation Age of Onset  . Diabetes Mother   . Hypertension  Mother   . Heart disease Mother     before age 37  . Heart disease Father     before age 53  . Hypertension Father   . Heart attack Father   . Lung cancer Maternal Grandfather   . Lung cancer Paternal Grandfather   . Hypertension Daughter   . Stroke Paternal Grandmother     Medications:   Medication List       Accurate as of 10/31/15 12:22 PM. Always use your most recent med list.          albuterol 108 (90 Base) MCG/ACT inhaler Commonly known as:  PROAIR HFA INHALE 2 PUFFS INTO THE LUNGS EVERY 6 (SIX) HOURS AS NEEDED.   ALPRAZolam 0.5 MG tablet Commonly known as:  XANAX Take 1 tablet (0.5 mg total) by mouth 2 (two) times daily as needed for anxiety.   atorvastatin 20 MG tablet Commonly known as:  LIPITOR TAKE 1 TABLET (20 MG TOTAL) BY MOUTH DAILY.   budesonide-formoterol 160-4.5 MCG/ACT inhaler Commonly known as:  SYMBICORT Inhale 2 puffs into the lungs 2 (two) times daily.   clobetasol cream 0.05 % Commonly known as:  TEMOVATE Apply 1 application topically daily as needed (itching).   furosemide 20 MG tablet Commonly known as:  LASIX Take 1 tablet (20 mg total) by mouth daily. May take additional 20 mg a day for swelling as needed   losartan 50 MG tablet Commonly known as:  COZAAR Take 1 tablet (50 mg total) by mouth daily.   mupirocin ointment 2 % Commonly known as:  BACTROBAN Place 1 application into the nose 2 (two) times daily.   oxymetazoline 0.05 % nasal spray Commonly known as:  AFRIN Place 2 sprays into both nostrils daily as needed for congestion.   pantoprazole 40 MG tablet Commonly known as:  PROTONIX Take 40 mg by mouth daily before breakfast.   polyethylene glycol packet Commonly known as:  MIRALAX / GLYCOLAX Take 17 g by mouth daily as needed for mild constipation.   sotalol 80 MG tablet Commonly known as:  BETAPACE Take 1 tablet (80 mg total) by mouth 2 (two) times daily.   SYSTANE ULTRA 0.4-0.3 % Soln Generic drug:  Polyethyl  Glycol-Propyl Glycol Place 1 drop into both eyes daily as needed (for dry eyes).   tiZANidine 4 MG tablet Commonly known as:  ZANAFLEX Take 4 mg by mouth at bedtime as needed for muscle spasms.   traMADol 50 MG tablet Commonly known as:  ULTRAM Take 1 tablet (50 mg total) by mouth every 6 (six) hours as needed for moderate pain.       Immunizations: Immunization History  Administered Date(s) Administered  . Influenza Split 01/01/2012  . Influenza Whole 11/30/2008  . Influenza,inj,Quad PF,36+ Mos 01/19/2013  . Pneumococcal Polysaccharide-23 11/30/2008     Physical Exam:  Vitals:   10/31/15 1218  BP: (!) 153/67  Pulse: 63  Resp: 18  Temp: 98.4 F (36.9 C)  TempSrc: Oral  SpO2: 97%  Weight: 206 lb (93.4 kg)  Height: 5\' 2"  (1.575 m)   Body mass index is 37.68 kg/m.  General- elderly female, obese, in no acute distress Head-  normocephalic, atraumatic Nose- no nasal discharge Throat- moist mucus membrane Eyes- PERRLA, EOMI, no pallor, no icterus, no discharge, normal conjunctiva, normal sclera Neck- no cervical lymphadenopathy Cardiovascular- irregular heart rate, + murmur, trace leg edema Respiratory- bilateral clear to auscultation, no wheeze, no rhonchi, no crackles, no use of accessory muscles Abdomen- bowel sounds present, soft, non tender, no guarding or rigidity, no CVA tenderness Musculoskeletal- able to move all 4 extremities, generalized weakness Neurological- alert and oriented to person, place and time Skin- warm and dry, bruise to both her arms Psychiatry- normal mood and affect    Labs reviewed: Basic Metabolic Panel:  Recent Labs  12/27/23 0227  10/28/15 0149 10/29/15 0358 10/30/15 1034  NA 140  < > 142 140 140  K 3.4*  < > 3.7 3.4* 3.8  CL 108  < > 113* 110 110  CO2 27  < > 24 26 23   GLUCOSE 132*  < > 96 94 100*  BUN 7  < > <5* <5* 7  CREATININE 1.37*  < > 1.04* 1.12* 0.99  CALCIUM 8.5*  < > 8.1* 8.2* 8.8*  MG 1.4*  --   --   --   --    < > = values in this interval not displayed. Liver Function Tests:  Recent Labs  10/21/15 1447 10/27/15 1221  AST 31 32  ALT 8* 10*  ALKPHOS 47 56  BILITOT 1.9* 3.0*  PROT 4.5* 4.3*  ALBUMIN 2.8* 2.5*   No results for input(s): LIPASE, AMYLASE in the last 8760 hours. No results for input(s): AMMONIA in the last 8760 hours. CBC:  Recent Labs  10/27/15 1221  10/28/15 0149 10/29/15 0358 10/30/15 1034  WBC 6.2  < > 6.5 7.8 9.2  NEUTROABS 3.8  --   --   --   --   HGB 8.5*  < > 10.7* 11.3* 12.8  HCT 28.6*  < > 35.0* 36.7 41.5  MCV 91.4  < > 89.1 88.9 89.2  PLT 76*  < > 71* 70* 74*  < > = values in this interval not displayed. Cardiac Enzymes:  Recent Labs  10/21/15 1447  TROPONINI 0.04*    Radiological Exams: No results found.  Assessment/Plan  Physical deconditioning Will have her work with physical therapy and occupational therapy team to help with gait training and muscle strengthening exercises.fall precautions. Skin care. Encourage to be out of bed.   Gi bleed Unidentified etiology, pending outpatient gi follow up. Her anticoagulation is on hold at present. Continue protonix 40 mg daily for now. Check cbc and monitor for bleed  afib Rate currently under control, continue sotalol 80 mg bid. Her anticoagulation is on hold until gi and cardiology follow up  Protein calorie malnutrition Monitor weekly weight and po intake. RD to evaluate  thrombocytopenia Monitor platelet count and for bleed  Chronic systolic chf Stable breathing, continue losartan with lasix and sotalol, monitor weight and bmp  HTN Monitor bp, continue losartan 50 mg daily, check bmp  Leg edema Stable, continue furosemide 20 mg daily, check bmp  Muscle spasm Continue tizanidine 4 mg qhs prn and monitor  Constipation Continue miralax daily as needed  Hyperlipidemia Continue lipitor 20 mg daily  Anxiety Stable mood, continue alprazolam 0.5 mg bid prn and monitor  ckd stage  3 Monitor bmp  Change her evening medication administration time to 9 pm per pt request   Goals of care: short term rehabilitation   Labs/tests ordered: cbc, cmp 11/05/15  Family/ staff Communication:  reviewed care plan with patient and nursing supervisor    Blanchie Serve, MD Internal Medicine [place], [place] [address] Cell Phone (Monday-Friday 8 am - 5 pm): [phone] On Call: [phone] and follow prompts after 5 pm and on weekends Office Phone: [phone] Office Fax: [phone]

## 2015-10-31 NOTE — Telephone Encounter (Signed)
New message   Pt verbalized that [facility] is [facility] and she feels that staff may have her medications mixed up she wants you to call, she said she is bleeding she had to get 6 pints of blood

## 2015-11-01 ENCOUNTER — Other Ambulatory Visit: Payer: Self-pay | Admitting: *Deleted

## 2015-11-01 MED ORDER — ATORVASTATIN CALCIUM 20 MG PO TABS: ORAL_TABLET | ORAL | 2 refills | Status: DC

## 2015-11-06 ENCOUNTER — Non-Acute Institutional Stay (SKILLED_NURSING_FACILITY): Payer: Medicare Other | Admitting: Family

## 2015-11-06 DIAGNOSIS — E46 Unspecified protein-calorie malnutrition: Secondary | ICD-10-CM

## 2015-11-06 DIAGNOSIS — F329 Major depressive disorder, single episode, unspecified: Secondary | ICD-10-CM

## 2015-11-06 DIAGNOSIS — R600 Localized edema: Secondary | ICD-10-CM | POA: Diagnosis not present

## 2015-11-06 DIAGNOSIS — K5901 Slow transit constipation: Secondary | ICD-10-CM

## 2015-11-06 DIAGNOSIS — E876 Hypokalemia: Secondary | ICD-10-CM

## 2015-11-06 DIAGNOSIS — F411 Generalized anxiety disorder: Secondary | ICD-10-CM | POA: Diagnosis not present

## 2015-11-06 DIAGNOSIS — F32A Depression, unspecified: Secondary | ICD-10-CM

## 2015-11-06 NOTE — Progress Notes (Signed)
Location:   [facility]  Nursing Home Room Number: 1204 Place of Service:  SNF (31) Provider: Orin Eberwein FNP-C   FULP, CAMMIE, MD  Patient Care Team: [name], MD as PCP - General (Family Medicine) Storm Frisk, MD (Pulmonary Disease)  Extended Emergency Contact Information Primary Emergency Contact: Wise,[name] Address: [address]          [place], [place] [address] [place] Home Phone: [phone] Mobile Phone: [phone] Relation: Sister Secondary Emergency Contact: Hem,Bruce Address: [address]          [place], [place] [address] [place] Mobile Phone: [phone] Relation: Son  Code Status:  Full Code  Goals of care: Advanced Directive information Advanced Directives 10/27/2015  Does patient have an advance directive? No  Would patient like information on creating an advanced directive? No - patient declined information     Chief Complaint  Patient presents with  . Acute Visit    abnormal Lab results     HPI:  Pt is a 70 y.o. female seen today at [facility] for an acute visit for evaluation of abnormal lab results. She is seen in her room today. She states feeling more depressed than usual states used to take antidepressant but does not recall the medication name but would like to start on something to help with depression. She also states had a Bowel movement but seems to be straining. She does not like to drink current ordered Miralax prefers stool softners. He recent lab results showed K+ 3.4, TP 4.7, Alb 2.81 ( 11/05/2015). She denies any fever, chills or cough. She continues to work well with PT/OT states helping with her back pain. Facility staff reports no new concerns.    Past Medical History:  Diagnosis Date  . Anemia   . Arthritis   . Asthma   . Atrial fibrillation (HCC)   . Atrial fibrillation with controlled ventricular response (HCC) 11/2009  . Chronic bronchitis   .  Chronic headache   . COPD (chronic obstructive pulmonary disease) (HCC)   . Depression   . Heart attack (HCC)   . Hyperlipidemia   . Hypertension    Past Surgical History:  Procedure Laterality Date  . CAD-CABG     x7, brief post-op Atrial Fib  . CESAREAN SECTION     x2  . COLONOSCOPY WITH PROPOFOL N/A 10/24/2015   Procedure: COLONOSCOPY WITH PROPOFOL;  Surgeon: Carman Ching, MD;  Location: [facility] END[facility]COPY;  Service: Endoscopy;  Laterality: N/A;  . CORONARY ARTERY BYPASS GRAFT  2011  . ESOPHAGOGASTRODUODENOSCOPY N/A 10/22/2015   Procedure: ESOPHAGOGASTRODUODENOSCOPY (EGD);  Surgeon: Carman Ching, MD;  Location: [facility] ENDOSCOPY;  Service: Endoscopy;  Laterality: N/A;  . JOINT REPLACEMENT Bilateral   . TONSILLECTOMY    . TOTAL KNEE ARTHROPLASTY      Allergies  Allergen Reactions  . Novocain [Procaine] Hives and Other (See Comments)    palpitations  . Penicillins Hives    Has patient had a PCN reaction causing immediate rash, facial/tongue/throat swelling, SOB or lightheadedness with hypotension: Yes Has patient had a PCN reaction causing severe rash involving mucus membranes or skin necrosis: No Has patient had a PCN reaction that required hospitalization No Has patient had a PCN reaction occurring within the last 10 years: No If all of the above answers are "NO", then may proceed with Cephalosporin use.      Medication List       Accurate as of  11/06/15  4:36 PM. Always use your most recent med list.          albuterol 108 (90 Base) MCG/ACT inhaler Commonly known as:  PROAIR HFA INHALE 2 PUFFS INTO THE LUNGS EVERY 6 (SIX) HOURS AS NEEDED.   ALPRAZolam 0.5 MG tablet Commonly known as:  XANAX Take 1 tablet (0.5 mg total) by mouth 2 (two) times daily as needed for anxiety.   atorvastatin 20 MG tablet Commonly known as:  LIPITOR TAKE 1 TABLET (20 MG TOTAL) BY MOUTH DAILY.   budesonide-formoterol 160-4.5 MCG/ACT inhaler Commonly known as:  SYMBICORT Inhale 2 puffs into the  lungs 2 (two) times daily.   clobetasol cream 0.05 % Commonly known as:  TEMOVATE Apply 1 application topically daily as needed (itching).   furosemide 20 MG tablet Commonly known as:  LASIX Take 1 tablet (20 mg total) by mouth daily. May take additional 20 mg a day for swelling as needed   losartan 50 MG tablet Commonly known as:  COZAAR Take 1 tablet (50 mg total) by mouth daily.   oxymetazoline 0.05 % nasal spray Commonly known as:  AFRIN Place 2 sprays into both nostrils daily as needed for congestion.   pantoprazole 40 MG tablet Commonly known as:  PROTONIX Take 40 mg by mouth daily before breakfast.   polyethylene glycol packet Commonly known as:  MIRALAX / GLYCOLAX Take 17 g by mouth daily as needed for mild constipation.   sotalol 80 MG tablet Commonly known as:  BETAPACE Take 1 tablet (80 mg total) by mouth 2 (two) times daily.   SYSTANE ULTRA 0.4-0.3 % Soln Generic drug:  Polyethyl Glycol-Propyl Glycol Place 1 drop into both eyes daily as needed (for dry eyes).   tiZANidine 4 MG tablet Commonly known as:  ZANAFLEX Take 4 mg by mouth at bedtime as needed for muscle spasms.   traMADol 50 MG tablet Commonly known as:  ULTRAM Take 1 tablet (50 mg total) by mouth every 6 (six) hours as needed for moderate pain.       Review of Systems  Constitutional: Negative for activity change, appetite change, chills, fatigue and fever.  HENT: Negative for congestion, rhinorrhea, sinus pressure, sneezing and sore throat.   Eyes: Negative.   Respiratory: Negative for cough, shortness of breath and wheezing.   Cardiovascular: Positive for leg swelling. Negative for chest pain and palpitations.  Gastrointestinal: Positive for constipation. Negative for abdominal distention, abdominal pain, diarrhea, nausea and vomiting.  Endocrine: Negative.   Genitourinary: Negative for dysuria, flank pain, frequency and urgency.  Musculoskeletal: Positive for back pain and gait problem.    Skin: Negative for color change, pallor and rash.  Neurological: Negative for dizziness, seizures, syncope, light-headedness and headaches.  Hematological: Does not bruise/bleed easily.  Psychiatric/Behavioral: Negative for agitation, confusion, hallucinations and sleep disturbance. The patient is nervous/anxious.        Feeling depressed     Immunization History  Administered Date(s) Administered  . Influenza Split 01/01/2012  . Influenza Whole 11/30/2008  . Influenza,inj,Quad PF,36+ Mos 01/19/2013  . Pneumococcal Polysaccharide-23 11/30/2008   Pertinent  Health Maintenance Due  Topic Date Due  . FOOT EXAM  10/24/1955  . OPHTHALMOLOGY EXAM  10/24/1955  . PNA vac Low Risk Adult (2 of 2 - PCV13) 11/30/2009  . HEMOGLOBIN A1C  05/28/2010  . MAMMOGRAM  07/15/2015  . INFLUENZA VACCINE  10/01/2015  . COLONOSCOPY  10/23/2025  . DEXA SCAN  Completed   No flowsheet data found. Functional Status Survey:  Vitals:   11/06/15 1115  BP: 117/72  Pulse: 72  Resp: 18  Temp: 97.9 F (36.6 C)  SpO2: 95%  Weight: 200 lb 12.8 oz (91.1 kg)  Height: 5\' 2"  (1.575 m)   Body mass index is 36.73 kg/m. Physical Exam  Constitutional: She is oriented to person, place, and time. She appears well-developed and well-nourished. No distress.  HENT:  Head: Normocephalic.  Mouth/Throat: Oropharynx is clear and moist. No oropharyngeal exudate.  Eyes: Conjunctivae and EOM are normal. Pupils are equal, round, and reactive to light. Left eye exhibits no discharge. No scleral icterus.  Neck: Normal range of motion. No JVD present. No thyromegaly present.  Cardiovascular: Normal rate, regular rhythm and intact distal pulses.  Exam reveals no gallop and no friction rub.   No murmur heard. Pulmonary/Chest: Breath sounds normal. No respiratory distress. She has no wheezes. She has no rales.  Abdominal: Soft. Bowel sounds are normal. She exhibits no distension. There is no tenderness. There is no rebound  and no guarding.  Genitourinary:  Genitourinary Comments: Continent   Musculoskeletal:  Moves x 4 extremities. Bilateral lower extremities 1+2 edema.   Lymphadenopathy:    She has no cervical adenopathy.  Neurological: She is oriented to person, place, and time.  Skin: Skin is warm and dry. No rash noted. She is not diaphoretic. No erythema. No pallor.  Psychiatric:  Depressed.     Labs reviewed:  Recent Labs  10/24/15 0227  10/28/15 0149 10/29/15 0358 10/30/15 1034  NA 140  < > 142 140 140  K 3.4*  < > 3.7 3.4* 3.8  CL 108  < > 113* 110 110  CO2 27  < > 24 26 23   GLUCOSE 132*  < > 96 94 100*  BUN 7  < > <5* <5* 7  CREATININE 1.37*  < > 1.04* 1.12* 0.99  CALCIUM 8.5*  < > 8.1* 8.2* 8.8*  MG 1.4*  --   --   --   --   < > = values in this interval not displayed.  Recent Labs  10/21/15 1447 10/27/15 1221  AST 31 32  ALT 8* 10*  ALKPHOS 47 56  BILITOT 1.9* 3.0*  PROT 4.5* 4.3*  ALBUMIN 2.8* 2.5*    Recent Labs  10/27/15 1221  10/28/15 0149 10/29/15 0358 10/30/15 1034  WBC 6.2  < > 6.5 7.8 9.2  NEUTROABS 3.8  --   --   --   --   HGB 8.5*  < > 10.7* 11.3* 12.8  HCT 28.6*  < > 35.0* 36.7 41.5  MCV 91.4  < > 89.1 88.9 89.2  PLT 76*  < > 71* 70* 74*  < > = values in this interval not displayed. Lab Results  Component Value Date   TSH 4.149 11/27/2009   Lab Results  Component Value Date   HGBA1C (H) 11/27/2009    6.1 (NOTE)                                                                       According to the ADA Clinical Practice Recommendations for 2011, when HbA1c is used as a screening test:   >=6.5%   Diagnostic of Diabetes Mellitus           (  if abnormal result  is confirmed)  5.7-6.4%   Increased risk of developing Diabetes Mellitus  References:Diagnosis and Classification of Diabetes Mellitus,Diabetes Care,2011,34(Suppl 1):S62-S69 and Standards of Medical Care in         Diabetes - 2011,Diabetes Care,2011,34  (Suppl 1):S11-S61.   Lab Results   Component Value Date   CHOL  11/28/2009    112        ATP III CLASSIFICATION:  <200     mg/dL   Desirable  161-096  mg/dL   Borderline High  >=045    mg/dL   High          HDL 52 11/28/2009   LDLCALC  11/28/2009    40        Total Cholesterol/HDL:CHD Risk Coronary Heart Disease Risk Table                     Men   Women  1/2 Average Risk   3.4   3.3  Average Risk       5.0   4.4  2 X Average Risk   9.6   7.1  3 X Average Risk  23.4   11.0        Use the calculated Patient Ratio above and the CHD Risk Table to determine the patient's CHD Risk.        ATP III CLASSIFICATION (LDL):  <100     mg/dL   Optimal  409-811  mg/dL   Near or Above                    Optimal  130-159  mg/dL   Borderline  914-782  mg/dL   High  >956     mg/dL   Very High   TRIG 99 21/30/8657   CHOLHDL 2.2 11/28/2009    Assessment/Plan 1. Hypokalemia  recent lab results showed K+ 3.4 ( 11/05/2015).BMP 11/08/2015  2. Slow transit constipation LBM 11/04/2015 but reports strained. Start colace 100 mg capsule one daily.continue to monitor.   3. Localized edema Bilateral Lower extremities 1-2+ edema. Continue on Furosemide. Apply knee high Ted Hose on in the morning and off at bedtime. Monitor weight.   4. Depression Currently on no medication. Denies any suicidal ideation. Has used antidepressant in the past. Will start on Sertraline 25 mg Tablet. Continue to monitor for mood changes. Psyche to evaluate.   5. Generalized anxiety disorder Continue Alprazolam PRN   6. Protein Malnutrition  Recent lab results showed TP 4.7, Alb 2.81 ( 11/05/2015). RD consult to evaluate for supplements. Monitor BMP    Family/ staff Communication: Reviewed plan of care with patient and facility Nurse supervisor  Labs/tests ordered: [facility] 11/08/2015

## 2015-11-07 ENCOUNTER — Encounter: Payer: Self-pay | Admitting: Cardiology

## 2015-11-11 ENCOUNTER — Non-Acute Institutional Stay (SKILLED_NURSING_FACILITY): Payer: Medicare Other | Admitting: Family

## 2015-11-11 ENCOUNTER — Other Ambulatory Visit: Payer: Self-pay | Admitting: Cardiology

## 2015-11-11 DIAGNOSIS — E782 Mixed hyperlipidemia: Secondary | ICD-10-CM

## 2015-11-11 DIAGNOSIS — F329 Major depressive disorder, single episode, unspecified: Secondary | ICD-10-CM | POA: Diagnosis not present

## 2015-11-11 DIAGNOSIS — I5022 Chronic systolic (congestive) heart failure: Secondary | ICD-10-CM | POA: Diagnosis not present

## 2015-11-11 DIAGNOSIS — J449 Chronic obstructive pulmonary disease, unspecified: Secondary | ICD-10-CM

## 2015-11-11 DIAGNOSIS — I10 Essential (primary) hypertension: Secondary | ICD-10-CM | POA: Diagnosis not present

## 2015-11-11 DIAGNOSIS — I482 Chronic atrial fibrillation, unspecified: Secondary | ICD-10-CM

## 2015-11-11 DIAGNOSIS — F32A Depression, unspecified: Secondary | ICD-10-CM

## 2015-11-11 NOTE — Progress Notes (Signed)
Location:   [facility]  Nursing Home Room Number: 1204 Place of Service:  SNF (31)  Provider: Richarda Bladeinah Ngetich FNP-C   PCP: Cain SaupeFULP, CAMMIE, MD Patient Care Team: Cain Saupeammie Fulp, MD as PCP - General (Family Medicine) Storm FriskPatrick E Wright, MD (Pulmonary Disease)  Extended Emergency Contact Information Primary Emergency Contact: Wise,[name] Address: [address]          [place], [place] [address] [place] Home Phone: [phone] Mobile Phone: [phone] Relation: Sister Secondary Emergency Contact: Behar,Bruce Address: [address]          [place], [place] [address] [place] Mobile Phone: [phone] Relation: Son  Code Status: Full Code  Goals of care:  Advanced Directive information Advanced Directives 10/27/2015  Does patient have an advance directive? No  Would patient like information on creating an advanced directive? No - patient declined information     Allergies  Allergen Reactions  . Novocain [Procaine] Hives and Other (See Comments)    palpitations  . Penicillins Hives    Has patient had a PCN reaction causing immediate rash, facial/tongue/throat swelling, SOB or lightheadedness with hypotension: Yes Has patient had a PCN reaction causing severe rash involving mucus membranes or skin necrosis: No Has patient had a PCN reaction that required hospitalization No Has patient had a PCN reaction occurring within the last 10 years: No If all of the above answers are "NO", then may proceed with Cephalosporin use.    Chief Complaint  Patient presents with  . Discharge Note    HPI:  70 y.o. female [facility] at [facility] for discharge home. She was here for short term rehabilitation post hospital admission from 10/27/15-10/30/15 with hematochezia thought to be from diverticular bleed. She received 2 units of  PRBC transfusion and I.V fluids.Her anticoagulation was held in the hospital.She has a medical history of  HTN, CAD, CHF, COPD, Afib, Hyperlipidemia, Asthma, Depression among other conditions. She is seen in her room today. She denies any acute issues this visit. Her stay here in the rehab has been unremarkable. She has worked well with PT/OT now stable for discharge home.She will be discharged home with Home health PT/OT to continue with ROM, Exercise, Gait stability and muscle strengthening. FWW recommended but request 3 leg cane states has own walker at home.Home health services will be arranged by facility social worker prior to discharge.She will be discharged with medication from the facility. Prescription medication will be written x 1 month then patient to follow up with PCP in 1-2 weeks. Facility staff report no new concerns.      Past Medical History:  Diagnosis Date  . Anemia   . Arthritis   . Asthma   . Atrial fibrillation (HCC)   . Atrial fibrillation with controlled ventricular response (HCC) 11/2009  . Chronic bronchitis   . Chronic headache   . COPD (chronic obstructive pulmonary disease) (HCC)   . Depression   . Heart attack (HCC)   . Hyperlipidemia   . Hypertension     Past Surgical History:  Procedure Laterality Date  . CAD-CABG     x7, brief post-op Atrial Fib  . CESAREAN SECTION     x2  . COLONOSCOPY WITH PROPOFOL N/A 10/24/2015   Procedure: COLONOSCOPY WITH PROPOFOL;  Surgeon: Carman ChingJames Edwards, MD;  [facility];  Service: Endoscopy;  Laterality: N/A;  . CORONARY ARTERY BYPASS GRAFT  2011  . ESOPHAGOGASTRODUODENOSCOPY N/A 10/22/2015   Procedure: ESOPHAGOGASTRODUODENOSCOPY (EGD);  Surgeon: Carman Ching, MD;  Location: [facility] ENDOSCOPY;  Service: Endoscopy;  Laterality: N/A;  . JOINT REPLACEMENT Bilateral   . TONSILLECTOMY    . TOTAL KNEE ARTHROPLASTY        reports that she has quit smoking. Her smoking use included Cigarettes. She smoked 0.20 packs per day. She has never used smokeless tobacco. She reports that she does not drink alcohol or use drugs. Social  History   Social History  . Marital status: Divorced    Spouse name: N/A  . Number of children: N/A  . Years of education: N/A   Occupational History  . retired Clinical biochemist   Social History Main Topics  . Smoking status: Former Smoker    Packs/day: 0.20    Types: Cigarettes  . Smokeless tobacco: Never Used  . Alcohol use No  . Drug use: No  . Sexual activity: Not on file   Other Topics Concern  . Not on file   Social History Narrative  . No narrative on file      Allergies  Allergen Reactions  . Novocain [Procaine] Hives and Other (See Comments)    palpitations  . Penicillins Hives    Has patient had a PCN reaction causing immediate rash, facial/tongue/throat swelling, SOB or lightheadedness with hypotension: Yes Has patient had a PCN reaction causing severe rash involving mucus membranes or skin necrosis: No Has patient had a PCN reaction that required hospitalization No Has patient had a PCN reaction occurring within the last 10 years: No If all of the above answers are "NO", then may proceed with Cephalosporin use.    Pertinent  Health Maintenance Due  Topic Date Due  . FOOT EXAM  10/24/1955  . OPHTHALMOLOGY EXAM  10/24/1955  . PNA vac Low Risk Adult (2 of 2 - PCV13) 11/30/2009  . HEMOGLOBIN A1C  05/28/2010  . MAMMOGRAM  07/15/2015  . INFLUENZA VACCINE  10/01/2015  . COLONOSCOPY  10/23/2025  . DEXA SCAN  Completed    Medications:   Medication List       Accurate as of 11/11/15  8:25 PM. Always use your most recent med list.          albuterol 108 (90 Base) MCG/ACT inhaler Commonly known as:  PROAIR HFA INHALE 2 PUFFS INTO THE LUNGS EVERY 6 (SIX) HOURS AS NEEDED.   ALPRAZolam 0.5 MG tablet Commonly known as:  XANAX Take 1 tablet (0.5 mg total) by mouth 2 (two) times daily as needed for anxiety.   atorvastatin 20 MG tablet Commonly known as:  LIPITOR TAKE 1 TABLET (20 MG TOTAL) BY MOUTH DAILY.   budesonide-formoterol 160-4.5 MCG/ACT  inhaler Commonly known as:  SYMBICORT Inhale 2 puffs into the lungs 2 (two) times daily.   clobetasol cream 0.05 % Commonly known as:  TEMOVATE Apply 1 application topically daily as needed (itching).   docusate sodium 100 MG capsule Commonly known as:  COLACE Take 100 mg by mouth daily.   furosemide 20 MG tablet Commonly known as:  LASIX Take 1 tablet (20 mg total) by mouth daily. May take additional 20 mg a day for swelling as needed   losartan 50 MG tablet Commonly known as:  COZAAR TAKE 1 TABLET BY MOUTH EVERY DAY   oxymetazoline 0.05 % nasal spray Commonly known as:  AFRIN Place 2 sprays into both nostrils daily as needed for congestion.   pantoprazole 40 MG tablet Commonly known as:  PROTONIX Take 40 mg by mouth daily before breakfast.  polyethylene glycol packet Commonly known as:  MIRALAX / GLYCOLAX Take 17 g by mouth daily as needed for mild constipation.   sertraline 25 MG tablet Commonly known as:  ZOLOFT Take 25 mg by mouth daily.   sotalol 80 MG tablet Commonly known as:  BETAPACE Take 1 tablet (80 mg total) by mouth 2 (two) times daily.   SYSTANE ULTRA 0.4-0.3 % Soln Generic drug:  Polyethyl Glycol-Propyl Glycol Place 1 drop into both eyes daily as needed (for dry eyes).   tiZANidine 4 MG tablet Commonly known as:  ZANAFLEX Take 4 mg by mouth at bedtime as needed for muscle spasms.   traMADol 50 MG tablet Commonly known as:  ULTRAM Take 1 tablet (50 mg total) by mouth every 6 (six) hours as needed for moderate pain.       Review of Systems  Constitutional: Negative for activity change, appetite change, chills, fatigue and fever.  HENT: Negative for congestion, rhinorrhea, sinus pressure, sneezing and sore throat.   Eyes: Negative.   Respiratory: Negative for cough, shortness of breath and wheezing.   Cardiovascular: Negative for chest pain, palpitations and leg swelling.  Gastrointestinal: Negative for abdominal distention, abdominal pain,  constipation, diarrhea, nausea and vomiting.  Endocrine: Negative.   Genitourinary: Negative for dysuria, flank pain, frequency and urgency.  Musculoskeletal: Positive for back pain and gait problem.  Skin: Negative for color change, pallor and rash.  Neurological: Negative for dizziness, seizures, syncope, light-headedness and headaches.  Hematological: Does not bruise/bleed easily.  Psychiatric/Behavioral: Negative for agitation, confusion, hallucinations and sleep disturbance. The patient is not nervous/anxious.     Vitals:   11/11/15 1030  BP: (!) 142/72  Pulse: 66  Resp: 20  Temp: 97.5 F (36.4 C)  SpO2: 95%  Weight: 200 lb 12.8 oz (91.1 kg)  Height: 5\' 2"  (1.575 m)   Body mass index is 36.73 kg/m. Physical Exam  Constitutional: She is oriented to person, place, and time. She appears well-developed and well-nourished. No distress.  HENT:  Head: Normocephalic.  Mouth/Throat: Oropharynx is clear and moist. No oropharyngeal exudate.  Eyes: Conjunctivae and EOM are normal. Pupils are equal, round, and reactive to light. Left eye exhibits no discharge. No scleral icterus.  Neck: Normal range of motion. No JVD present. No thyromegaly present.  Cardiovascular: Normal rate, regular rhythm and intact distal pulses.  Exam reveals no gallop and no friction rub.   No murmur heard. Pulmonary/Chest: Breath sounds normal. No respiratory distress. She has no wheezes. She has no rales.  Abdominal: Soft. Bowel sounds are normal. She exhibits no distension. There is no tenderness. There is no rebound and no guarding.  Genitourinary:  Genitourinary Comments: Continent   Musculoskeletal: She exhibits no tenderness.  Moves x 4 extremities. Bilateral lower extremities 1+ edema.   Lymphadenopathy:    She has no cervical adenopathy.  Neurological: She is oriented to person, place, and time.  Skin: Skin is warm and dry. No rash noted. She is not diaphoretic. No erythema. No pallor.  Psychiatric:  She has a normal mood and affect.    Labs reviewed: Basic Metabolic Panel:  Recent Labs  16/10/96 0227  10/28/15 0149 10/29/15 0358 10/30/15 1034  NA 140  < > 142 140 140  K 3.4*  < > 3.7 3.4* 3.8  CL 108  < > 113* 110 110  CO2 27  < > 24 26 23   GLUCOSE 132*  < > 96 94 100*  BUN 7  < > <5* <5* 7  CREATININE 1.37*  < > 1.04* 1.12* 0.99  CALCIUM 8.5*  < > 8.1* 8.2* 8.8*  MG 1.4*  --   --   --   --   < > = values in this interval not displayed. Liver Function Tests:  Recent Labs  10/21/15 1447 10/27/15 1221  AST 31 32  ALT 8* 10*  ALKPHOS 47 56  BILITOT 1.9* 3.0*  PROT 4.5* 4.3*  ALBUMIN 2.8* 2.5*   CBC:  Recent Labs  10/27/15 1221  10/28/15 0149 10/29/15 0358 10/30/15 1034  WBC 6.2  < > 6.5 7.8 9.2  NEUTROABS 3.8  --   --   --   --   HGB 8.5*  < > 10.7* 11.3* 12.8  HCT 28.6*  < > 35.0* 36.7 41.5  MCV 91.4  < > 89.1 88.9 89.2  PLT 76*  < > 71* 70* 74*  < > = values in this interval not displayed. Cardiac Enzymes:  Recent Labs  10/21/15 1447  TROPONINI 0.04*   Assessment/Plan:   1. Essential hypertension B/p stable continue on Metoprolol, Losartan and Lasix. BMP in 1-2 weeks with PCP.   2. Chronic atrial fibrillation  HR controlled. continue on Metoprolol  3. Chronic systolic heart failure  Stable. No recent weight gain.Exam findings negative. Continue on Metoprolol and Lasix. Monitor weight daily.   4. Chronic obstructive pulmonary disease Breathing stable. Continue on Albuterol inhaler every 6 hours PRN and Symbicort for wheezing or shortness of breath.   5. Hyperlipidemia Continue on Lipitor. Lipid panel with PCP.   6. Depression Stable. Continue to monitor for mood changes.   7. Unsteady gait   Has worked well with PT/OT now stable for discharge home.She will be discharged home with Home health PT/OT to continue with ROM, Exercise, Gait stability and muscle strengthening. FWW recommended but request 3 leg cane states has own walker at  home.  Patient is being discharged with the following home health services:    - PT/OT to continue with ROM, Exercise, Gait stability and muscle strengthening.   Patient is being discharged with the following durable medical equipment:    FWW recommended but request 3 leg cane states has own walker at home.   Patient has been advised to f/u with their PCP in 1-2 weeks to bring them up to date on their rehab stay.  Social services at facility was responsible for arranging this appointment.  Pt was provided with a 30 day supply of prescriptions for medications and refills must be obtained from their PCP.  For controlled substances, a more limited supply may be provided adequate until PCP appointment only.  Future labs/tests needed:  CBC, BMP in 1-2 weeks with PCP

## 2015-11-12 ENCOUNTER — Encounter: Payer: Self-pay | Admitting: *Deleted

## 2015-11-12 ENCOUNTER — Ambulatory Visit (INDEPENDENT_AMBULATORY_CARE_PROVIDER_SITE_OTHER): Payer: Medicare Other | Admitting: Cardiology

## 2015-11-12 ENCOUNTER — Encounter: Payer: Self-pay | Admitting: Cardiology

## 2015-11-12 ENCOUNTER — Encounter (INDEPENDENT_AMBULATORY_CARE_PROVIDER_SITE_OTHER): Payer: Self-pay

## 2015-11-12 VITALS — BP 122/74 | HR 50 | Ht 61.0 in | Wt 200.4 lb

## 2015-11-12 DIAGNOSIS — D689 Coagulation defect, unspecified: Secondary | ICD-10-CM

## 2015-11-12 DIAGNOSIS — D649 Anemia, unspecified: Secondary | ICD-10-CM

## 2015-11-12 DIAGNOSIS — K922 Gastrointestinal hemorrhage, unspecified: Secondary | ICD-10-CM | POA: Diagnosis not present

## 2015-11-12 DIAGNOSIS — I2583 Coronary atherosclerosis due to lipid rich plaque: Secondary | ICD-10-CM

## 2015-11-12 DIAGNOSIS — I2510 Atherosclerotic heart disease of native coronary artery without angina pectoris: Secondary | ICD-10-CM

## 2015-11-12 DIAGNOSIS — I480 Paroxysmal atrial fibrillation: Secondary | ICD-10-CM

## 2015-11-12 NOTE — Progress Notes (Signed)
[address] [place], [place]  [address] Phone: [phone] Fax:  [phone]  Date:  11/12/2015   ID:  Gabrielle, [name] [birth year], MRN [id number]  PCP:  [name], MD   History of Present Illness: Gabrielle [name] is a 69 y.o. female  with coronary artery disease status post bypass surgery in 2010, paroxysmal atrial fibrillation, antiarrhythmics sotalol, EF 45-50%. COPD, obesity who unfortunately was hospitalized 10/21/15 with significant lower GI bleed, bright red blood per him, hematochezia with hemoglobin of 4 requiring a total of 6 units of packed red blood cells, 4 during initial admission and 2 during a repeat admission after she had hypotension and more blood loss. GI, Dr. Dulce Sellar, [facility] has seen. Friable mucosa noted on colonoscopy. Presumed diverticular bleed. She has been off of her Eliquis since then. Please see below for full details.  Even last night, she does see spotting at times, bright red blood which she attributes to her hemorrhoid.  She has noticed some increased lower extremity edema, at the lowest her weight was 187. She takes 20 mg Lasix a day and occasionally and stress 20 mg. She has noted some urinary incontinence at times.  Gabrielle [name] twin.  Trying to avoid cigarettes.   Wt Readings from Last 3 Encounters:  11/12/15 200 lb 6.4 oz (90.9 kg)  11/11/15 200 lb 12.8 oz (91.1 kg)  11/06/15 200 lb 12.8 oz (91.1 kg)     Past Medical History:  Diagnosis Date  . Anemia   . Arthritis   . Asthma   . Atrial fibrillation (HCC)   . Atrial fibrillation with controlled ventricular response (HCC) 11/2009  . Chronic bronchitis   . Chronic headache   . COPD (chronic obstructive pulmonary disease) (HCC)   . Depression   . Heart attack (HCC)   . Hyperlipidemia   . Hypertension     Past Surgical History:  Procedure Laterality Date  . CAD-CABG     x7, brief post-op Atrial Fib  . CESAREAN SECTION     x2  . COLONOSCOPY WITH PROPOFOL  N/A 10/24/2015   Procedure: COLONOSCOPY WITH PROPOFOL;  Surgeon: Carman Ching, MD;  Location: [facility] ENDOSCOPY;  Service: Endoscopy;  Laterality: N/A;  . CORONARY ARTERY BYPASS GRAFT  2011  . ESOPHAGOGASTRODUODENOSCOPY N/A 10/22/2015   Procedure: ESOPHAGOGASTRODUODENOSCOPY (EGD);  Surgeon: Carman Ching, MD;  Location: [facility] ENDOSCOPY;  Service: Endoscopy;  Laterality: N/A;  . JOINT REPLACEMENT Bilateral   . TONSILLECTOMY    . TOTAL KNEE ARTHROPLASTY      Current Outpatient Prescriptions  Medication Sig Dispense Refill  . albuterol (PROAIR HFA) 108 (90 BASE) MCG/ACT inhaler INHALE 2 PUFFS INTO THE LUNGS EVERY 6 (SIX) HOURS AS NEEDED. 18 g 3  . ALPRAZolam (XANAX) 0.5 MG tablet Take 1 tablet (0.5 mg total) by mouth 2 (two) times daily as needed for anxiety. 10 tablet 0  . atorvastatin (LIPITOR) 20 MG tablet TAKE 1 TABLET (20 MG TOTAL) BY MOUTH DAILY. 90 tablet 2  . budesonide-formoterol (SYMBICORT) 160-4.5 MCG/ACT inhaler Inhale 2 puffs into the lungs 2 (two) times daily. 10.2 g 6  . clobetasol cream (TEMOVATE) 0.05 % Apply 1 application topically daily as needed (itching).   3  . docusate sodium (COLACE) 100 MG capsule Take 100 mg by mouth daily.    . furosemide (LASIX) 20 MG tablet Take 1 tablet (20 mg total) by mouth daily. May take additional 20 mg a day for swelling as needed 180  tablet 1  . losartan (COZAAR) 50 MG tablet TAKE 1 TABLET BY MOUTH EVERY DAY 90 tablet 2  . oxymetazoline (AFRIN) 0.05 % nasal spray Place 2 sprays into both nostrils daily as needed for congestion.     . pantoprazole (PROTONIX) 40 MG tablet Take 40 mg by mouth daily before breakfast.     . Polyethyl Glycol-Propyl Glycol (SYSTANE ULTRA) 0.4-0.3 % SOLN Place 1 drop into both eyes daily as needed (for dry eyes).     . polyethylene glycol (MIRALAX / GLYCOLAX) packet Take 17 g by mouth daily as needed for mild constipation. 14 each 0  . sertraline (ZOLOFT) 25 MG tablet Take 25 mg by mouth daily.    . sotalol (BETAPACE) 80 MG  tablet Take 1 tablet (80 mg total) by mouth 2 (two) times daily.    Marland Kitchen. tiZANidine (ZANAFLEX) 4 MG tablet Take 4 mg by mouth at bedtime as needed for muscle spasms.    . traMADol (ULTRAM) 50 MG tablet Take 1 tablet (50 mg total) by mouth every 6 (six) hours as needed for moderate pain. 15 tablet 0   No current facility-administered medications for this visit.     Allergies:    Allergies  Allergen Reactions  . Novocain [Procaine] Hives and Other (See Comments)    palpitations  . Penicillins Hives    Has patient had a PCN reaction causing immediate rash, facial/tongue/throat swelling, SOB or lightheadedness with hypotension: Yes Has patient had a PCN reaction causing severe rash involving mucus membranes or skin necrosis: No Has patient had a PCN reaction that required hospitalization No Has patient had a PCN reaction occurring within the last 10 years: No If all of the above answers are "NO", then may proceed with Cephalosporin use.    Social History:  The patient  reports that she has quit smoking. Her smoking use included Cigarettes. She smoked 0.20 packs per day. She has never used smokeless tobacco. She reports that she does not drink alcohol or use drugs.   Family History  Problem Relation Age of Onset  . Diabetes Mother   . Hypertension Mother   . Heart disease Mother     before age 160  . Heart disease Father     before age 72  . Hypertension Father   . Heart attack Father   . Lung cancer Maternal Grandfather   . Lung cancer Paternal Grandfather   . Hypertension Daughter   . Stroke Paternal Grandmother     ROS:  Please see the history of present illness.   Denies any bleeding, syncope, orthopnea, PND   All other systems reviewed and negative.   PHYSICAL EXAM: VS:  BP 122/74   Pulse (!) 50   Ht 5\' 1"  (1.549 m)   Wt 200 lb 6.4 oz (90.9 kg)   BMI 37.87 kg/m  Well nourished, well developed, in no acute distress In wheelchair. HEENT: normal, [place]/AT, EOMI Neck: no JVD,  normal carotid upstroke, no bruit Cardiac:  normal S1, S2; RRR; no murmur  Lungs:  clear to auscultation bilaterally, no wheezing, rhonchi or rales  Abd: soft, nontender, no hepatomegaly, no bruits  Ext: 1+ edema, 2+ distal pulses Skin: warm and dry, silvery scale plaque throughout several portions of her body.  GU: deferred Neuro: no focal abnormalities noted, AAO x 3  EKG:  No EKG today 01/07/15-sinus bradycardia rate 54 with frequent PVCs, low amplitude P wave, poor R-wave progression, T-wave inversion in 1 and aVL. 07/04/14-sinus rhythm, 72, nonspecific  ST-T wave changes -prior 02/07/14-sinus rhythm, 61, inferior infarct pattern, nonspecific ST-T wave changes. Poor R-wave progression.   Labs: 01/19/14-creatinine 0.85, potassium 4.3, TSH 0.67, glucose 139  ASSESSMENT AND PLAN:  1. Paroxysmal atrial fibrillation-was on apixaban but had presumed diverticular bleeding admitted 10/21/15 with Hg of 4, BRBPR s/p 4 units PRBC with repeat diverticular bleeding 10/27/15 after DC in SNF with 2 unit PRBC transfusion (Hg was 4 on original admit). Anticoagulation held. Colonoscopy diffusely friable with contact bleeding. EGD normal. She had recurrent hematochezia at SNF after 2 days of no bleeding. EMS - hypotensive. GI recs were to hold anticoagulation for 2 weeks prior to restarting.  Continue with Sotalol. She does feel occasional palpitations. I do hear on auscultation occasional ectopy. Sounds like PVCs as was previously described on EKG. one time, she discontinued all of her medications and within a couple days she felt her heart racing. She is now back on these medicines. 2. Chronic anticoagulation-as above. Stopped. We discussed at length risks versus benefits and at this time given continued occasional hematochezia, severe anemia requiring 2 hospitalizations I do not wish to resume anticoagulation at this time. We will revisit in the future. We also discussed the potential for watchman device however she  would require Plavix and Coumadin during the initial phase. I do not wish to pursue this at this time. She understands the risk of stroke. 3. Chronic kidney disease stage III-she is seeing Dr. Lowell Guitar. She did have recent exacerbation of kidney function. On methotrexate for psoriasis. 4. Coronary artery disease-status post CABG. Overall no anginal symptoms. Try to encourage her to take statin.  Lasix because of shortness of breath. 5. Obesity-encourage weight loss.  6. Depression-improved.  7. Hyperlipidemia-atorvastatin  8. Bradycardia - no metoprolol. Only sotalol. 9. Smoker - encourage continued cessation.  10. 2 month follow up    Signed, Donato Schultz, MD [facility]  11/12/2015 8:45 AM

## 2015-11-12 NOTE — Patient Instructions (Addendum)
Medication Instructions:  The current medical regimen is effective;  continue present plan and medications. You may take extra Furosemide for 2 to 3 days to help decrease swelling.  Follow-Up: Follow up in 2 months with Dr Anne FuSkains.  If you need a refill on your cardiac medications before your next appointment, please call your pharmacy.  Thank you for choosing  [facility]!!

## 2015-11-14 ENCOUNTER — Telehealth: Payer: Self-pay | Admitting: Cardiology

## 2015-11-14 NOTE — Telephone Encounter (Signed)
New Message:    Pt wants to know if she needs to be on Metoprolol,they stopped it when she was in the hospital.

## 2015-11-14 NOTE — Telephone Encounter (Signed)
Spoke with patient who is aware she does npt need to be on Metoprolol at this time and to not restart it.  She states understanding and how happy she is Dr Anne FuSkains is her doctor.

## 2015-11-22 ENCOUNTER — Telehealth: Payer: Self-pay | Admitting: Cardiology

## 2015-11-22 NOTE — Telephone Encounter (Signed)
New Message  Pt call requesting to speak with RN. Pt states her PCP instructed her to go to the ER if she had any further issues with her health. Pt states she did not want to go to the ER and call to speak with RN. Please call back to discuss

## 2015-11-22 NOTE — Telephone Encounter (Signed)
Follow up ° ° °Pt verbalized that she is returning call for rn °

## 2015-11-22 NOTE — Telephone Encounter (Signed)
Lm to cb.

## 2015-11-22 NOTE — Telephone Encounter (Signed)
Spoke with pt who is reporting she was instructed to go to the ED and she doesn't want to.  She is reporting her PCP instructed her to go to the ED for further evaluation and treatment of bradycardia.  Pt denies feeling poorly - denies any s/s from HR being "44"  Advised pt to continue to monitor her HR and have someone take her to the ED to she becomes symptomatic.  (blurred vision, dizziness, syncopal etc.) advised her to avoid taking prn medications such as tramadol, alprazolam and tizanidine if possible.  She states understanding.  Will forward this information to Dr Anne FuSkains for his view and recommendations.

## 2015-11-23 NOTE — Telephone Encounter (Signed)
Agree. Make sure she is not taking metoprolol. Let's also decrease her sotalol from 80 BID to 80 QD.  Donato SchultzMark Skains, MD

## 2015-11-25 NOTE — Telephone Encounter (Signed)
Lm to cb.

## 2015-11-26 NOTE — Telephone Encounter (Signed)
Lm to cb.

## 2015-11-27 MED ORDER — SOTALOL HCL 80 MG PO TABS: 80.0000 mg | ORAL_TABLET | Freq: Every day | ORAL | Status: DC

## 2015-11-27 NOTE — Telephone Encounter (Signed)
Would not change anything for now.  Donato SchultzMark Torrie Namba, MD

## 2015-11-27 NOTE — Telephone Encounter (Signed)
Spoke with patient who is repoprting her BP was elevated today and the physical therapist wouldn't exercise her today.  BP 186/84  HR 64.  Other BPs have been 144/86 on Friday last week, 118/64 on Thursday and 130/80 on Monday.  She is taking medications as listed though she did decrease sotalol to 80 mg once a day as instructed recently.  Yesterday was the first day she took that decreased dose.  Advised to continue to monitor BP/HR at home and that I will forward information to Dr Anne FuSkains to review.  She is aware I will c/b with any changes to medications.

## 2015-11-27 NOTE — Telephone Encounter (Signed)
noted 

## 2015-12-03 ENCOUNTER — Other Ambulatory Visit: Payer: Self-pay | Admitting: Family Medicine

## 2015-12-03 DIAGNOSIS — R7989 Other specified abnormal findings of blood chemistry: Secondary | ICD-10-CM

## 2015-12-03 DIAGNOSIS — R945 Abnormal results of liver function studies: Principal | ICD-10-CM

## 2015-12-16 ENCOUNTER — Encounter (HOSPITAL_COMMUNITY): Payer: Self-pay | Admitting: Emergency Medicine

## 2015-12-16 ENCOUNTER — Inpatient Hospital Stay (HOSPITAL_COMMUNITY)
Admission: EM | Admit: 2015-12-16 | Discharge: 2015-12-28 | DRG: 242 | Disposition: A | Discharge status: Home Health | Payer: Medicare Other | Attending: Internal Medicine | Admitting: Internal Medicine

## 2015-12-16 ENCOUNTER — Inpatient Hospital Stay (HOSPITAL_COMMUNITY): Payer: Medicare Other

## 2015-12-16 ENCOUNTER — Emergency Department (HOSPITAL_COMMUNITY): Payer: Medicare Other

## 2015-12-16 DIAGNOSIS — I4891 Unspecified atrial fibrillation: Secondary | ICD-10-CM | POA: Diagnosis not present

## 2015-12-16 DIAGNOSIS — R05 Cough: Secondary | ICD-10-CM

## 2015-12-16 DIAGNOSIS — J181 Lobar pneumonia, unspecified organism: Secondary | ICD-10-CM

## 2015-12-16 DIAGNOSIS — I2583 Coronary atherosclerosis due to lipid rich plaque: Secondary | ICD-10-CM | POA: Diagnosis not present

## 2015-12-16 DIAGNOSIS — J440 Chronic obstructive pulmonary disease with acute lower respiratory infection: Secondary | ICD-10-CM | POA: Diagnosis not present

## 2015-12-16 DIAGNOSIS — I509 Heart failure, unspecified: Secondary | ICD-10-CM

## 2015-12-16 DIAGNOSIS — E785 Hyperlipidemia, unspecified: Secondary | ICD-10-CM | POA: Diagnosis present

## 2015-12-16 DIAGNOSIS — R52 Pain, unspecified: Secondary | ICD-10-CM

## 2015-12-16 DIAGNOSIS — R0602 Shortness of breath: Secondary | ICD-10-CM

## 2015-12-16 DIAGNOSIS — N183 Chronic kidney disease, stage 3 (moderate): Secondary | ICD-10-CM | POA: Diagnosis present

## 2015-12-16 DIAGNOSIS — Z951 Presence of aortocoronary bypass graft: Secondary | ICD-10-CM | POA: Diagnosis not present

## 2015-12-16 DIAGNOSIS — K2211 Ulcer of esophagus with bleeding: Secondary | ICD-10-CM | POA: Diagnosis not present

## 2015-12-16 DIAGNOSIS — K284 Chronic or unspecified gastrojejunal ulcer with hemorrhage: Secondary | ICD-10-CM | POA: Diagnosis not present

## 2015-12-16 DIAGNOSIS — I5021 Acute systolic (congestive) heart failure: Secondary | ICD-10-CM | POA: Diagnosis not present

## 2015-12-16 DIAGNOSIS — Z950 Presence of cardiac pacemaker: Secondary | ICD-10-CM

## 2015-12-16 DIAGNOSIS — I959 Hypotension, unspecified: Secondary | ICD-10-CM | POA: Diagnosis not present

## 2015-12-16 DIAGNOSIS — Z888 Allergy status to other drugs, medicaments and biological substances status: Secondary | ICD-10-CM

## 2015-12-16 DIAGNOSIS — R1310 Dysphagia, unspecified: Secondary | ICD-10-CM

## 2015-12-16 DIAGNOSIS — W1830XA Fall on same level, unspecified, initial encounter: Secondary | ICD-10-CM | POA: Diagnosis present

## 2015-12-16 DIAGNOSIS — I130 Hypertensive heart and chronic kidney disease with heart failure and stage 1 through stage 4 chronic kidney disease, or unspecified chronic kidney disease: Principal | ICD-10-CM | POA: Diagnosis present

## 2015-12-16 DIAGNOSIS — F329 Major depressive disorder, single episode, unspecified: Secondary | ICD-10-CM | POA: Diagnosis present

## 2015-12-16 DIAGNOSIS — R9431 Abnormal electrocardiogram [ECG] [EKG]: Secondary | ICD-10-CM

## 2015-12-16 DIAGNOSIS — I480 Paroxysmal atrial fibrillation: Secondary | ICD-10-CM | POA: Diagnosis present

## 2015-12-16 DIAGNOSIS — J9811 Atelectasis: Secondary | ICD-10-CM | POA: Diagnosis not present

## 2015-12-16 DIAGNOSIS — I5033 Acute on chronic diastolic (congestive) heart failure: Secondary | ICD-10-CM | POA: Insufficient documentation

## 2015-12-16 DIAGNOSIS — J9601 Acute respiratory failure with hypoxia: Secondary | ICD-10-CM

## 2015-12-16 DIAGNOSIS — Z7951 Long term (current) use of inhaled steroids: Secondary | ICD-10-CM | POA: Diagnosis not present

## 2015-12-16 DIAGNOSIS — H9209 Otalgia, unspecified ear: Secondary | ICD-10-CM

## 2015-12-16 DIAGNOSIS — D649 Anemia, unspecified: Secondary | ICD-10-CM | POA: Diagnosis present

## 2015-12-16 DIAGNOSIS — Z8249 Family history of ischemic heart disease and other diseases of the circulatory system: Secondary | ICD-10-CM

## 2015-12-16 DIAGNOSIS — R627 Adult failure to thrive: Secondary | ICD-10-CM | POA: Diagnosis present

## 2015-12-16 DIAGNOSIS — I272 Pulmonary hypertension, unspecified: Secondary | ICD-10-CM | POA: Diagnosis present

## 2015-12-16 DIAGNOSIS — I252 Old myocardial infarction: Secondary | ICD-10-CM | POA: Diagnosis not present

## 2015-12-16 DIAGNOSIS — R0902 Hypoxemia: Secondary | ICD-10-CM

## 2015-12-16 DIAGNOSIS — Z79899 Other long term (current) drug therapy: Secondary | ICD-10-CM

## 2015-12-16 DIAGNOSIS — I495 Sick sinus syndrome: Secondary | ICD-10-CM | POA: Diagnosis not present

## 2015-12-16 DIAGNOSIS — I5043 Acute on chronic combined systolic (congestive) and diastolic (congestive) heart failure: Secondary | ICD-10-CM | POA: Diagnosis not present

## 2015-12-16 DIAGNOSIS — W19XXXA Unspecified fall, initial encounter: Secondary | ICD-10-CM | POA: Diagnosis not present

## 2015-12-16 DIAGNOSIS — J189 Pneumonia, unspecified organism: Secondary | ICD-10-CM

## 2015-12-16 DIAGNOSIS — J441 Chronic obstructive pulmonary disease with (acute) exacerbation: Secondary | ICD-10-CM

## 2015-12-16 DIAGNOSIS — Z880 Allergy status to penicillin: Secondary | ICD-10-CM

## 2015-12-16 DIAGNOSIS — L8990 Pressure ulcer of unspecified site, unspecified stage: Secondary | ICD-10-CM | POA: Insufficient documentation

## 2015-12-16 DIAGNOSIS — R059 Cough, unspecified: Secondary | ICD-10-CM

## 2015-12-16 DIAGNOSIS — D696 Thrombocytopenia, unspecified: Secondary | ICD-10-CM | POA: Diagnosis present

## 2015-12-16 DIAGNOSIS — R609 Edema, unspecified: Secondary | ICD-10-CM | POA: Diagnosis not present

## 2015-12-16 DIAGNOSIS — Z87891 Personal history of nicotine dependence: Secondary | ICD-10-CM

## 2015-12-16 DIAGNOSIS — J9600 Acute respiratory failure, unspecified whether with hypoxia or hypercapnia: Secondary | ICD-10-CM | POA: Diagnosis present

## 2015-12-16 DIAGNOSIS — Z7901 Long term (current) use of anticoagulants: Secondary | ICD-10-CM | POA: Diagnosis not present

## 2015-12-16 DIAGNOSIS — I2510 Atherosclerotic heart disease of native coronary artery without angina pectoris: Secondary | ICD-10-CM | POA: Diagnosis not present

## 2015-12-16 HISTORY — DX: Chronic diastolic (congestive) heart failure: I50.32

## 2015-12-16 LAB — BASIC METABOLIC PANEL
Anion gap: 6 (ref 5–15)
BUN: 10 mg/dL (ref 6–20)
CO2: 32 mmol/L (ref 22–32)
Calcium: 8.7 mg/dL — ABNORMAL LOW (ref 8.9–10.3)
Chloride: 102 mmol/L (ref 101–111)
Creatinine, Ser: 1.01 mg/dL — ABNORMAL HIGH (ref 0.44–1.00)
GFR calc Af Amer: >60 mL/min (ref >60)
GFR calc non Af Amer: 54 mL/min — ABNORMAL LOW (ref >60)
Glucose, Bld: 76 mg/dL (ref 65–99)
Potassium: 3.1 mmol/L — ABNORMAL LOW (ref 3.5–5.1)
Sodium: 140 mmol/L (ref 135–145)

## 2015-12-16 LAB — URINALYSIS, ROUTINE W REFLEX MICROSCOPIC
Bilirubin Urine: NEGATIVE
Glucose, UA: NEGATIVE mg/dL
Ketones, ur: NEGATIVE mg/dL
Leukocytes, UA: NEGATIVE
Nitrite: NEGATIVE
Protein, ur: NEGATIVE mg/dL
Specific Gravity, Urine: 1.009 (ref 1.005–1.030)
pH: 6.5 (ref 5.0–8.0)

## 2015-12-16 LAB — BRAIN NATRIURETIC PEPTIDE: B Natriuretic Peptide: 751.9 pg/mL — ABNORMAL HIGH (ref 0.0–100.0)

## 2015-12-16 LAB — CBC
HCT: 38.3 % (ref 36.0–46.0)
Hemoglobin: 12.2 g/dL (ref 12.0–15.0)
MCH: 31 pg (ref 26.0–34.0)
MCHC: 31.9 g/dL (ref 30.0–36.0)
MCV: 97.2 fL (ref 78.0–100.0)
Platelets: 126 10*3/uL — ABNORMAL LOW (ref 150–400)
RBC: 3.94 MIL/uL (ref 3.87–5.11)
RDW: 19.9 % — ABNORMAL HIGH (ref 11.5–15.5)
WBC: 8.7 10*3/uL (ref 4.0–10.5)

## 2015-12-16 LAB — I-STAT TROPONIN, ED: Troponin i, poc: 0 ng/mL (ref 0.00–0.08)

## 2015-12-16 LAB — URINE MICROSCOPIC-ADD ON

## 2015-12-16 LAB — TROPONIN I: Troponin I: <0.03 ng/mL (ref <0.03)

## 2015-12-16 LAB — CBG MONITORING, ED: Glucose-Capillary: 73 mg/dL (ref 65–99)

## 2015-12-16 LAB — MAGNESIUM: Magnesium: 1.5 mg/dL — ABNORMAL LOW (ref 1.7–2.4)

## 2015-12-16 MED ORDER — ALPRAZOLAM 0.5 MG PO TABS
0.5000 mg | ORAL_TABLET | Freq: Two times a day (BID) | ORAL | Status: DC | PRN
  Administered 2015-12-17 – 2015-12-28 (×10): 0.5 mg via ORAL
  Filled 2015-12-16 (×10): qty 1

## 2015-12-16 MED ORDER — PANTOPRAZOLE SODIUM 40 MG PO TBEC
40.0000 mg | DELAYED_RELEASE_TABLET | Freq: Every day | ORAL | Status: DC
  Administered 2015-12-17 – 2015-12-23 (×7): 40 mg via ORAL
  Filled 2015-12-16 (×7): qty 1

## 2015-12-16 MED ORDER — TIZANIDINE HCL 4 MG PO TABS
4.0000 mg | ORAL_TABLET | Freq: Every evening | ORAL | Status: DC | PRN
  Administered 2015-12-19 – 2015-12-27 (×5): 4 mg via ORAL
  Filled 2015-12-16 (×7): qty 1

## 2015-12-16 MED ORDER — POLYETHYL GLYCOL-PROPYL GLYCOL 0.4-0.3 % OP SOLN: 1.0000 [drp] | Freq: Every day | OPHTHALMIC | Status: DC | PRN

## 2015-12-16 MED ORDER — IPRATROPIUM-ALBUTEROL 0.5-2.5 (3) MG/3ML IN SOLN
3.0000 mL | RESPIRATORY_TRACT | Status: AC
  Administered 2015-12-16 (×3): 3 mL via RESPIRATORY_TRACT
  Filled 2015-12-16: qty 3

## 2015-12-16 MED ORDER — ALBUTEROL SULFATE (2.5 MG/3ML) 0.083% IN NEBU
5.0000 mg | INHALATION_SOLUTION | Freq: Once | RESPIRATORY_TRACT | Status: AC
  Administered 2015-12-16: 5 mg via RESPIRATORY_TRACT
  Filled 2015-12-16: qty 6

## 2015-12-16 MED ORDER — HEPARIN SODIUM (PORCINE) 5000 UNIT/ML IJ SOLN
5000.0000 [IU] | Freq: Three times a day (TID) | INTRAMUSCULAR | Status: DC
  Administered 2015-12-17 – 2015-12-20 (×11): 5000 [IU] via SUBCUTANEOUS
  Filled 2015-12-16 (×10): qty 1

## 2015-12-16 MED ORDER — ACETAMINOPHEN 325 MG PO TABS
650.0000 mg | ORAL_TABLET | Freq: Four times a day (QID) | ORAL | Status: DC | PRN
  Administered 2015-12-26: 650 mg via ORAL
  Filled 2015-12-16: qty 2

## 2015-12-16 MED ORDER — ONDANSETRON HCL 4 MG/2ML IJ SOLN
4.0000 mg | Freq: Four times a day (QID) | INTRAMUSCULAR | Status: DC | PRN
  Administered 2015-12-22 – 2015-12-23 (×2): 4 mg via INTRAVENOUS
  Filled 2015-12-16 (×2): qty 2

## 2015-12-16 MED ORDER — MOMETASONE FURO-FORMOTEROL FUM 200-5 MCG/ACT IN AERO
2.0000 | INHALATION_SPRAY | Freq: Two times a day (BID) | RESPIRATORY_TRACT | Status: DC
  Administered 2015-12-17 – 2015-12-28 (×20): 2 via RESPIRATORY_TRACT
  Filled 2015-12-16 (×2): qty 8.8

## 2015-12-16 MED ORDER — PREDNISONE 20 MG PO TABS
40.0000 mg | ORAL_TABLET | Freq: Once | ORAL | Status: AC
  Administered 2015-12-16: 40 mg via ORAL
  Filled 2015-12-16: qty 2

## 2015-12-16 MED ORDER — DOCUSATE SODIUM 100 MG PO CAPS: 100.0000 mg | ORAL_CAPSULE | Freq: Every day | ORAL | Status: DC | PRN

## 2015-12-16 MED ORDER — ATORVASTATIN CALCIUM 20 MG PO TABS
20.0000 mg | ORAL_TABLET | Freq: Every evening | ORAL | Status: DC
  Administered 2015-12-17 – 2015-12-27 (×10): 20 mg via ORAL
  Filled 2015-12-16 (×2): qty 1
  Filled 2015-12-16: qty 2
  Filled 2015-12-16: qty 1
  Filled 2015-12-16 (×5): qty 2
  Filled 2015-12-16: qty 1
  Filled 2015-12-16: qty 2

## 2015-12-16 MED ORDER — POTASSIUM CHLORIDE CRYS ER 20 MEQ PO TBCR
40.0000 meq | EXTENDED_RELEASE_TABLET | Freq: Once | ORAL | Status: AC
  Administered 2015-12-17: 40 meq via ORAL
  Filled 2015-12-16: qty 2

## 2015-12-16 MED ORDER — SOTALOL HCL 80 MG PO TABS
80.0000 mg | ORAL_TABLET | Freq: Every day | ORAL | Status: DC
  Administered 2015-12-17 – 2015-12-18 (×2): 80 mg via ORAL
  Filled 2015-12-16 (×2): qty 1

## 2015-12-16 MED ORDER — POLYETHYLENE GLYCOL 3350 17 G PO PACK: 17.0000 g | PACK | Freq: Every day | ORAL | Status: DC | PRN

## 2015-12-16 MED ORDER — FUROSEMIDE 10 MG/ML IJ SOLN
40.0000 mg | Freq: Two times a day (BID) | INTRAMUSCULAR | Status: DC
  Administered 2015-12-17 – 2015-12-21 (×8): 40 mg via INTRAVENOUS
  Filled 2015-12-16 (×9): qty 4

## 2015-12-16 MED ORDER — ACETAMINOPHEN 650 MG RE SUPP: 650.0000 mg | Freq: Four times a day (QID) | RECTAL | Status: DC | PRN

## 2015-12-16 MED ORDER — FUROSEMIDE 10 MG/ML IJ SOLN
40.0000 mg | Freq: Once | INTRAMUSCULAR | Status: AC
  Administered 2015-12-16: 40 mg via INTRAVENOUS
  Filled 2015-12-16: qty 4

## 2015-12-16 MED ORDER — CITALOPRAM HYDROBROMIDE 20 MG PO TABS
20.0000 mg | ORAL_TABLET | Freq: Two times a day (BID) | ORAL | Status: DC
  Administered 2015-12-17 – 2015-12-18 (×4): 20 mg via ORAL
  Filled 2015-12-16 (×4): qty 1

## 2015-12-16 MED ORDER — SODIUM CHLORIDE 0.9% FLUSH
3.0000 mL | Freq: Two times a day (BID) | INTRAVENOUS | Status: DC
  Administered 2015-12-17 – 2015-12-28 (×20): 3 mL via INTRAVENOUS

## 2015-12-16 MED ORDER — IPRATROPIUM-ALBUTEROL 0.5-2.5 (3) MG/3ML IN SOLN
3.0000 mL | RESPIRATORY_TRACT | Status: DC | PRN
  Administered 2015-12-17: 3 mL via RESPIRATORY_TRACT
  Filled 2015-12-16 (×2): qty 3

## 2015-12-16 MED ORDER — TRAMADOL HCL 50 MG PO TABS
50.0000 mg | ORAL_TABLET | Freq: Four times a day (QID) | ORAL | Status: DC | PRN
  Administered 2015-12-17 – 2015-12-28 (×18): 50 mg via ORAL
  Filled 2015-12-16 (×18): qty 1

## 2015-12-16 MED ORDER — ONDANSETRON HCL 4 MG PO TABS: 4.0000 mg | ORAL_TABLET | Freq: Four times a day (QID) | ORAL | Status: DC | PRN

## 2015-12-16 MED ORDER — LOSARTAN POTASSIUM 50 MG PO TABS
50.0000 mg | ORAL_TABLET | Freq: Every day | ORAL | Status: DC
  Administered 2015-12-17 – 2015-12-19 (×3): 50 mg via ORAL
  Filled 2015-12-16 (×3): qty 1

## 2015-12-16 MED ORDER — IPRATROPIUM-ALBUTEROL 0.5-2.5 (3) MG/3ML IN SOLN
3.0000 mL | RESPIRATORY_TRACT | Status: DC
  Administered 2015-12-17 (×2): 3 mL via RESPIRATORY_TRACT
  Filled 2015-12-16 (×2): qty 3

## 2015-12-16 MED ORDER — DOXYCYCLINE HYCLATE 100 MG IV SOLR
100.0000 mg | Freq: Two times a day (BID) | INTRAVENOUS | Status: DC
  Administered 2015-12-17: 100 mg via INTRAVENOUS
  Filled 2015-12-16 (×2): qty 100

## 2015-12-16 MED ORDER — POLYVINYL ALCOHOL 1.4 % OP SOLN
1.0000 [drp] | OPHTHALMIC | Status: DC | PRN
  Filled 2015-12-16: qty 15

## 2015-12-16 NOTE — ED Provider Notes (Signed)
[facility]-EMERGENCY DEPT Provider Note   CSN: [id number] Arrival date & time: 12/16/15  1431     History   Chief Complaint Chief Complaint  Patient presents with  . Weakness  . Hypoxia  . Altered Mental Status    HPI Gabrielle [name] is a 70 y.o. female.  The history is provided by the patient.  Shortness of Breath  This is a new problem. The average episode lasts 1 week. The problem has been gradually worsening. Associated symptoms include rhinorrhea, cough, sputum production and leg swelling. Pertinent negatives include no fever, no sore throat, no ear pain, no chest pain, no vomiting, no abdominal pain and no rash. Treatments tried: currently being treated for PNA. Associated medical issues include asthma, COPD, pneumonia and heart failure.   Mechanical fall 2 days ago from standing. No LOC. Complains of left lateral chest pain, which makes it hard to cough.  Does not wear O2 at home.  H/o A fib, but taken off of Eliquis recently due to GI bleed.    Past Medical History:  Diagnosis Date  . Anemia   . Arthritis   . Asthma   . Atrial fibrillation (HCC)   . Atrial fibrillation with controlled ventricular response (HCC) 11/2009  . Chronic bronchitis   . Chronic headache   . COPD (chronic obstructive pulmonary disease) (HCC)   . Depression   . Heart attack   . Hyperlipidemia   . Hypertension     Patient Active Problem List   Diagnosis Date Noted  . CHF (congestive heart failure) (HCC) 12/16/2015  . Anemia due to blood loss, acute 10/27/2015  . Elevated lactic acid level 10/27/2015  . Systolic heart failure (HCC) 10/27/2015  . Arterial hypotension   . PAF (paroxysmal atrial fibrillation) (HCC) 10/21/2015  . Systolic CHF (HCC) 10/21/2015  . Severe anemia   . Symptomatic anemia   . Coronary artery disease due to lipid rich plaque 02/07/2014  . Obesity 02/07/2014  . Abdominal aortic ectasia (HCC) 09/06/2013  . Encounter for therapeutic drug monitoring 07/13/2013    . Atrial fibrillation (HCC) 12/20/2012  . Atrial fibrillation with controlled ventricular response (HCC) 11/30/2009  . NICOTINE ADDICTION 04/02/2009  . PARAPSORIASIS 12/15/2008  . Hyperlipidemia 12/14/2008  . HEART ATTACK 12/14/2008  . HEADACHE, CHRONIC 12/14/2008  . Depression 12/13/2008  . Essential hypertension 12/13/2008  . COPD (chronic obstructive pulmonary disease) (HCC) 12/13/2008  . ARTHRITIS 12/13/2008    Past Surgical History:  Procedure Laterality Date  . CAD-CABG     x7, brief post-op Atrial Fib  . CESAREAN SECTION     x2  . COLONOSCOPY WITH PROPOFOL N/A 10/24/2015   Procedure: COLONOSCOPY WITH PROPOFOL;  Surgeon: Carman Ching, MD;  Location: [facility] ENDOSCOPY;  Service: Endoscopy;  Laterality: N/A;  . CORONARY ARTERY BYPASS GRAFT  2011  . ESOPHAGOGASTRODUODENOSCOPY N/A 10/22/2015   Procedure: ESOPHAGOGASTRODUODENOSCOPY (EGD);  Surgeon: Carman Ching, MD;  Location: [facility] ENDOSCOPY;  Service: Endoscopy;  Laterality: N/A;  . JOINT REPLACEMENT Bilateral   . TONSILLECTOMY    . TOTAL KNEE ARTHROPLASTY      OB History    No data available       Home Medications    Prior to Admission medications   Medication Sig Start Date End Date Taking? Authorizing Provider  albuterol (PROAIR HFA) 108 (90 BASE) MCG/ACT inhaler INHALE 2 PUFFS INTO THE LUNGS EVERY 6 (SIX) HOURS AS NEEDED. Patient taking differently: Inhale 2 puffs into the lungs every 4 (four) hours as needed for wheezing or  shortness of breath. INHALE 2 PUFFS INTO THE LUNGS EVERY 6 (SIX) HOURS AS NEEDED. 01/15/14  Yes Storm FriskPatrick E Wright, MD  ALPRAZolam Prudy Feeler(XANAX) 0.5 MG tablet Take 1 tablet (0.5 mg total) by mouth 2 (two) times daily as needed for anxiety. 10/30/15  Yes [place] Cyndie MullL Johnson, MD  atorvastatin (LIPITOR) 20 MG tablet TAKE 1 TABLET (20 MG TOTAL) BY MOUTH DAILY. Patient taking differently: Take 20 mg by mouth every evening.  11/01/15  Yes Jake BatheMark C Skains, MD  budesonide-formoterol [facility](SYMBICORT) 160-4.5 MCG/ACT inhaler  Inhale 2 puffs into the lungs 2 (two) times daily. 01/15/14  Yes Storm FriskPatrick E Wright, MD  citalopram (CELEXA) 20 MG tablet Take 20 mg by mouth 2 (two) times daily.  11/28/15  Yes Historical Provider, MD  clindamycin (CLEOCIN) 300 MG capsule Take 300 mg by mouth 2 (two) times daily. 12/10/15  Yes Historical Provider, MD  clobetasol cream (TEMOVATE) 0.05 % Apply 1 application topically daily as needed (itching).    Yes Historical Provider, MD  docusate sodium (COLACE) 100 MG capsule Take 100 mg by mouth daily as needed for moderate constipation.    Yes Historical Provider, MD  furosemide (LASIX) 20 MG tablet Take 1 tablet (20 mg total) by mouth daily. May take additional 20 mg a day for swelling as needed Patient taking differently: Take 20 mg by mouth daily. May take additional 20 mg a day for as needed for swelling. 09/26/15  Yes Jake BatheMark C Skains, MD  losartan (COZAAR) 50 MG tablet TAKE 1 TABLET BY MOUTH EVERY DAY Patient taking differently: TAKE 50mg  TABLET BY MOUTH ONCE DAILY 11/11/15  Yes Jake BatheMark C Skains, MD  oxymetazoline (AFRIN) 0.05 % nasal spray Place 2 sprays into both nostrils 2 (two) times daily as needed for congestion.    Yes Historical Provider, MD  pantoprazole (PROTONIX) 40 MG tablet Take 40 mg by mouth daily before breakfast.  10/10/14  Yes Historical Provider, MD  Polyethyl Glycol-Propyl Glycol (SYSTANE ULTRA) 0.4-0.3 % SOLN Place 1 drop into both eyes daily as needed (for dry eyes).    Yes Historical Provider, MD  polyethylene glycol (MIRALAX / GLYCOLAX) packet Take 17 g by mouth daily as needed for mild constipation. 10/30/15  Yes Cl[place] Cyndie MullL Johnson, MD  sotalol (BETAPACE) 80 MG tablet Take 1 tablet (80 mg total) by mouth daily. 11/27/15  Yes Jake BatheMark C Skains, MD  tiZANidine (ZANAFLEX) 4 MG tablet Take 4 mg by mouth at bedtime as needed for muscle spasms.   Yes Historical Provider, MD  traMADol (ULTRAM) 50 MG tablet Take 1 tablet (50 mg total) by mouth every 6 (six) hours as needed for moderate  pain. 10/30/15  Yes Clanford Cyndie MullL Johnson, MD    Family History Family History  Problem Relation Age of Onset  . Diabetes Mother   . Hypertension Mother   . Heart disease Mother     before age 72  . Heart disease Father     before age 72  . Hypertension Father   . Heart attack Father   . Lung cancer Maternal Grandfather   . Lung cancer Paternal Grandfather   . Hypertension Daughter   . Stroke Paternal Grandmother     Social History Social History  Substance Use Topics  . Smoking status: Former Smoker    Packs/day: 0.20    Types: Cigarettes  . Smokeless tobacco: Never Used  . Alcohol use No     Allergies   Novocain [procaine] and Penicillins   Review of Systems Review of Systems  Constitutional: Positive for activity change. Negative for chills and fever.  HENT: Positive for rhinorrhea. Negative for ear pain and sore throat.   Eyes: Negative for pain and visual disturbance.  Respiratory: Positive for cough, sputum production and shortness of breath.   Cardiovascular: Positive for leg swelling. Negative for chest pain and palpitations.  Gastrointestinal: Negative for abdominal pain and vomiting.  Genitourinary: Negative for dysuria and hematuria.  Musculoskeletal: Negative for arthralgias and back pain.  Skin: Negative for color change and rash.  Neurological: Negative for seizures and syncope.  All other systems reviewed and are negative.    Physical Exam Updated Vital Signs BP 167/75   Pulse 57   Resp 19   SpO2 100%   Physical Exam  Constitutional: She is oriented to person, place, and time. She appears well-developed and well-nourished. No distress.  HENT:  Head: Normocephalic and atraumatic.  Nose: Nose normal.  Eyes: Conjunctivae and EOM are normal. Pupils are equal, round, and reactive to light. Right eye exhibits no discharge. Left eye exhibits no discharge. No scleral icterus.  Neck: Normal range of motion. Neck supple.  Cardiovascular: Normal rate  and regular rhythm.  Exam reveals no gallop and no friction rub.   No murmur heard. Pulmonary/Chest: Effort normal. No stridor. Tachypnea noted. No respiratory distress. She has wheezes (throughout). She has no rales.  Abdominal: Soft. She exhibits no distension. There is no tenderness.  Musculoskeletal: She exhibits no edema or tenderness.  2+ BLE pitting edema  Neurological: She is alert and oriented to person, place, and time.  Skin: Skin is warm and dry. No rash noted. She is not diaphoretic. No erythema.  Psychiatric: She has a normal mood and affect.  Vitals reviewed.    ED Treatments / Results  Labs (all labs ordered are listed, but only abnormal results are displayed) Labs Reviewed  BASIC METABOLIC PANEL - Abnormal; Notable for the following:       Result Value   Potassium 3.1 (*)    Creatinine, Ser 1.01 (*)    Calcium 8.7 (*)    GFR calc non Af Amer 54 (*)    All other components within normal limits  CBC - Abnormal; Notable for the following:    RDW 19.9 (*)    Platelets 126 (*)    All other components within normal limits  URINALYSIS, ROUTINE W REFLEX MICROSCOPIC (NOT AT [facility]) - Abnormal; Notable for the following:    Hgb urine dipstick MODERATE (*)    All other components within normal limits  URINE MICROSCOPIC-ADD ON - Abnormal; Notable for the following:    Squamous Epithelial / LPF 0-5 (*)    Bacteria, UA FEW (*)    All other components within normal limits  BRAIN NATRIURETIC PEPTIDE - Abnormal; Notable for the following:    B Natriuretic Peptide 751.9 (*)    All other components within normal limits  CBG MONITORING, ED  I-STAT TROPOININ, ED    EKG  EKG Interpretation  Date/Time:  Monday December 16 2015 15:04:09 EDT Ventricular Rate:  60 PR Interval:    QRS Duration: 118 QT Interval:  592 QTC Calculation: 602 R Axis:   -20 Text Interpretation:  Sinus rhythm Supraventricular bigeminy Sinus pause Nonspecific intraventricular conduction delay Anterior  infarct, age indeterminate No significant change since last tracing Confirmed by [facility] MD, Marciana Uplinger [id number]) on 12/16/2015 4:34:30 PM       Radiology Dg Chest 2 View  Result Date: 12/16/2015 CLINICAL DATA:  Chest pain and shortness of  Breath EXAM: CHEST  2 VIEW COMPARISON:  11/27/2009 FINDINGS: Cardiac shadow is enlarged. Postsurgical changes are again seen. The lungs are hyperinflated with some mid lung scarring on the left. Increased density in the right lung base is noted projecting in the right middle lobe on the lateral projection. Mild interstitial edema is noted particularly in the right lung base. No sizable effusion is seen. No acute bony abnormality is noted. IMPRESSION: Right middle lobe infiltrate. Mild interstitial edema is noted in the right lung base. Electronically Signed   By: Alcide Clever M.D.   On: 12/16/2015 17:06   Dg Shoulder Left  Result Date: 12/16/2015 CLINICAL DATA:  Left shoulder pain following fall 2 days ago, initial encounter EXAM: LEFT SHOULDER - 2+ VIEW COMPARISON:  None. FINDINGS: There is no evidence of fracture or dislocation. There is no evidence of arthropathy or other focal bone abnormality. Soft tissues are unremarkable. Old healed rib fractures are noted. IMPRESSION: No acute abnormality noted. Electronically Signed   By: Alcide Clever M.D.   On: 12/16/2015 17:06    Procedures Procedures (including critical care time)  Medications Ordered in ED Medications  furosemide (LASIX) injection 40 mg (not administered)  albuterol (PROVENTIL) (2.5 MG/3ML) 0.083% nebulizer solution 5 mg (5 mg Nebulization Given 12/16/15 1542)  predniSONE (DELTASONE) tablet 40 mg (40 mg Oral Given 12/16/15 1706)  albuterol (PROVENTIL) (2.5 MG/3ML) 0.083% nebulizer solution 5 mg (5 mg Nebulization Given 12/16/15 1711)  ipratropium-albuterol (DUONEB) 0.5-2.5 (3) MG/3ML nebulizer solution 3 mL (3 mLs Nebulization Given 12/16/15 1820)     Initial Impression / Assessment and Plan /  ED Course  I have reviewed the triage vital signs and the nursing notes.  Pertinent labs & imaging results that were available during my care of the patient were reviewed by me and considered in my medical decision making (see chart for details).  Clinical Course    Exam with evidence of volume overload. Workup consistent with CHF exacerbation with elevated BNP. Likely secondary to pneumonia and is currently being treated. Patient also with diffuse wheezing. History of COPD. Noted to be hypoxic upon arrival however improved with breathing treatment. Patient given steroids and additional DuoNeb's. Chest x-ray without evidence of pulmonary edema however did reveal the right middle lobe pneumonia. No evidence of sepsis.  Will admit for continued management of CHF exacerbation.    Final Clinical Impressions(s) / ED Diagnoses   Final diagnoses:  Acute congestive heart failure, unspecified congestive heart failure type (HCC)  Community acquired pneumonia of right middle lobe of lung (HCC)  COPD exacerbation (HCC)    Disposition: Admit  Condition: stable    Nira Conn, MD 12/16/15 1944

## 2015-12-16 NOTE — H&P (Addendum)
History and Physical    Gabrielle [name] ZOX:[id number] DOB: [birth year] DOA: 12/16/2015  PCP: [name], MD  Patient coming from: Home.  Chief Complaint: Shortness of breath.  HPI: Gabrielle [name] is a 70 y.o. female with CAD status post CABG, paroxysmal atrial fibrillation, chronic systolic heart failure, COPD, hypertension, hyperlipidemia was referred to the ER by patient's PCP because of shortness of breath. Patient states last week 7 days ago patient had visited her granddaughter. At that time patient's granddaughter was sick with cough. That evening patient started having cough with productive sputum which gradually worsened and 2 days later went to her PCP and was placed on antibiotics. Patient last weekend, 2 days ago on Saturday had a fall while walking in the house. Patient states she did not have any symptoms prior to the fall. She did not know what made her fall but she did not lose consciousness. Since the fall patient has been having low back pain. Patient had followed up with the PCP today who found her more short of breath than usual and was advised to come to the ER. In the ER patient was found to have bilateral wheezing and crepitations with chest x-ray showing congestion and possible pneumonia. Patient is being admitted for further management of respiratory failure probably from a combination of pneumonia and CHF.  ED Course: Lasix 40 mg IV was given.  Review of Systems: As per HPI, rest all negative.   Past Medical History:  Diagnosis Date  . Anemia   . Arthritis   . Asthma   . Atrial fibrillation (HCC)   . Atrial fibrillation with controlled ventricular response (HCC) 11/2009  . Chronic bronchitis   . Chronic headache   . COPD (chronic obstructive pulmonary disease) (HCC)   . Depression   . Heart attack   . Hyperlipidemia   . Hypertension     Past Surgical History:  Procedure Laterality Date  . CAD-CABG     x7, brief post-op Atrial Fib  . CESAREAN  SECTION     x2  . COLONOSCOPY WITH PROPOFOL N/A 10/24/2015   Procedure: COLONOSCOPY WITH PROPOFOL;  Surgeon: Carman Ching, MD;  Location: [facility] ENDOSCOPY;  Service: Endoscopy;  Laterality: N/A;  . CORONARY ARTERY BYPASS GRAFT  2011  . ESOPHAGOGASTRODUODENOSCOPY N/A 10/22/2015   Procedure: ESOPHAGOGASTRODUODENOSCOPY (EGD);  Surgeon: Carman Ching, MD;  Location: [facility] ENDOSCOPY;  Service: Endoscopy;  Laterality: N/A;  . JOINT REPLACEMENT Bilateral   . TONSILLECTOMY    . TOTAL KNEE ARTHROPLASTY       reports that she has quit smoking. Her smoking use included Cigarettes. She smoked 0.20 packs per day. She has never used smokeless tobacco. She reports that she does not drink alcohol or use drugs.  Allergies  Allergen Reactions  . Novocain [Procaine] Hives and Other (See Comments)    palpitations  . Penicillins Hives    Has patient had a PCN reaction causing immediate rash, facial/tongue/throat swelling, SOB or lightheadedness with hypotension: Yes Has patient had a PCN reaction causing severe rash involving mucus membranes or skin necrosis: No Has patient had a PCN reaction that required hospitalization No Has patient had a PCN reaction occurring within the last 10 years: No If all of the above answers are "NO", then may proceed with Cephalosporin use.    Family History  Problem Relation Age of Onset  . Diabetes Mother   . Hypertension Mother   . Heart disease Mother     before age 77  .  Heart disease Father     before age 72  . Hypertension Father   . Heart attack Father   . Lung cancer Maternal Grandfather   . Lung cancer Paternal Grandfather   . Hypertension Daughter   . Stroke Paternal Grandmother     Prior to Admission medications   Medication Sig Start Date End Date Taking? Authorizing Provider  albuterol (PROAIR HFA) 108 (90 BASE) MCG/ACT inhaler INHALE 2 PUFFS INTO THE LUNGS EVERY 6 (SIX) HOURS AS NEEDED. Patient taking differently: Inhale 2 puffs into the lungs every 4  (four) hours as needed for wheezing or shortness of breath. INHALE 2 PUFFS INTO THE LUNGS EVERY 6 (SIX) HOURS AS NEEDED. 01/15/14  Yes Storm FriskPatrick E Wright, MD  ALPRAZolam Prudy Feeler(XANAX) 0.5 MG tablet Take 1 tablet (0.5 mg total) by mouth 2 (two) times daily as needed for anxiety. 10/30/15  Yes Clanford Cyndie MullL Johnson, MD  atorvastatin (LIPITOR) 20 MG tablet TAKE 1 TABLET (20 MG TOTAL) BY MOUTH DAILY. Patient taking differently: Take 20 mg by mouth every evening.  11/01/15  Yes Jake BatheMark C Skains, MD  budesonide-formoterol [facility](SYMBICORT) 160-4.5 MCG/ACT inhaler Inhale 2 puffs into the lungs 2 (two) times daily. 01/15/14  Yes Storm FriskPatrick E Wright, MD  citalopram (CELEXA) 20 MG tablet Take 20 mg by mouth 2 (two) times daily.  11/28/15  Yes Historical Provider, MD  clindamycin (CLEOCIN) 300 MG capsule Take 300 mg by mouth 2 (two) times daily. 12/10/15  Yes Historical Provider, MD  clobetasol cream (TEMOVATE) 0.05 % Apply 1 application topically daily as needed (itching).    Yes Historical Provider, MD  docusate sodium (COLACE) 100 MG capsule Take 100 mg by mouth daily as needed for moderate constipation.    Yes Historical Provider, MD  furosemide (LASIX) 20 MG tablet Take 1 tablet (20 mg total) by mouth daily. May take additional 20 mg a day for swelling as needed Patient taking differently: Take 20 mg by mouth daily. May take additional 20 mg a day for as needed for swelling. 09/26/15  Yes Jake BatheMark C Skains, MD  losartan (COZAAR) 50 MG tablet TAKE 1 TABLET BY MOUTH EVERY DAY Patient taking differently: TAKE 50mg  TABLET BY MOUTH ONCE DAILY 11/11/15  Yes Jake BatheMark C Skains, MD  oxymetazoline (AFRIN) 0.05 % nasal spray Place 2 sprays into both nostrils 2 (two) times daily as needed for congestion.    Yes Historical Provider, MD  pantoprazole (PROTONIX) 40 MG tablet Take 40 mg by mouth daily before breakfast.  10/10/14  Yes Historical Provider, MD  Polyethyl Glycol-Propyl Glycol (SYSTANE ULTRA) 0.4-0.3 % SOLN Place 1 drop into both eyes daily as  needed (for dry eyes).    Yes Historical Provider, MD  polyethylene glycol (MIRALAX / GLYCOLAX) packet Take 17 g by mouth daily as needed for mild constipation. 10/30/15  Yes Clanford Cyndie MullL Johnson, MD  sotalol (BETAPACE) 80 MG tablet Take 1 tablet (80 mg total) by mouth daily. 11/27/15  Yes Jake BatheMark C Skains, MD  tiZANidine (ZANAFLEX) 4 MG tablet Take 4 mg by mouth at bedtime as needed for muscle spasms.   Yes Historical Provider, MD  traMADol (ULTRAM) 50 MG tablet Take 1 tablet (50 mg total) by mouth every 6 (six) hours as needed for moderate pain. 10/30/15  Yes Clanford Cyndie MullL Johnson, MD    Physical Exam: Vitals:   12/16/15 1836 12/16/15 1910 12/16/15 2016 12/16/15 2100  BP: (!) 134/54 109/70 (!) 133/49   Pulse: (!) 56 (!) 57 (!) 52   Resp: 14 14  13   Temp:   97.9 F (36.6 C)   TempSrc:   Oral   SpO2: 100% 100% 94%   Weight:   86.6 kg (191 lb) 87 kg (191 lb 11.2 oz)  Height:   5\' 2"  (1.575 m) 5\' 2"  (1.575 m)      Constitutional: Not in distress. Moderately built and nourished. Vitals:   12/16/15 1836 12/16/15 1910 12/16/15 2016 12/16/15 2100  BP: (!) 134/54 109/70 (!) 133/49   Pulse: (!) 56 (!) 57 (!) 52   Resp: 14 14 13    Temp:   97.9 F (36.6 C)   TempSrc:   Oral   SpO2: 100% 100% 94%   Weight:   86.6 kg (191 lb) 87 kg (191 lb 11.2 oz)  Height:   5\' 2"  (1.575 m) 5\' 2"  (1.575 m)   Eyes: Anicteric no pallor. ENMT: No discharge from the ears eyes nose or mouth. Neck: JVD mildly elevated no mass felt. No neck rigidity. Respiratory: Bilateral expiratory wheezes heard and crepitations. Cardiovascular: S1 and S2 heard no murmur. Abdomen: Soft nontender bowel sounds present. Musculoskeletal: Bilateral lower extremity edema. No joint effusion. Skin: Chronic skin changes no rash. Neurologic: Alert awake oriented to time place and person. Moves all extremities. Psychiatric: Appears normal. Normal affect.   Labs on Admission: I have personally reviewed following labs and imaging  studies  CBC:  Recent Labs Lab 12/16/15 1545  WBC 8.7  HGB 12.2  HCT 38.3  MCV 97.2  PLT 126*   Basic Metabolic Panel:  Recent Labs Lab 12/16/15 1545  NA 140  K 3.1*  CL 102  CO2 32  GLUCOSE 76  BUN 10  CREATININE 1.01*  CALCIUM 8.7*   GFR: Estimated Creatinine Clearance: 51.6 mL/min (by C-G formula based on SCr of 1.01 mg/dL (H)). Liver Function Tests: No results for input(s): AST, ALT, ALKPHOS, BILITOT, PROT, ALBUMIN in the last 168 hours. No results for input(s): LIPASE, AMYLASE in the last 168 hours. No results for input(s): AMMONIA in the last 168 hours. Coagulation Profile: No results for input(s): INR, PROTIME in the last 168 hours. Cardiac Enzymes: No results for input(s): CKTOTAL, CKMB, CKMBINDEX, TROPONINI in the last 168 hours. BNP (last 3 results) No results for input(s): PROBNP in the last 8760 hours. HbA1C: No results for input(s): HGBA1C in the last 72 hours. CBG:  Recent Labs Lab 12/16/15 1544  GLUCAP 73   Lipid Profile: No results for input(s): CHOL, HDL, LDLCALC, TRIG, CHOLHDL, LDLDIRECT in the last 72 hours. Thyroid Function Tests: No results for input(s): TSH, T4TOTAL, FREET4, T3FREE, THYROIDAB in the last 72 hours. Anemia Panel: No results for input(s): VITAMINB12, FOLATE, FERRITIN, TIBC, IRON, RETICCTPCT in the last 72 hours. Urine analysis:    Component Value Date/Time   COLORURINE YELLOW 12/16/2015 1533   APPEARANCEUR CLEAR 12/16/2015 1533   LABSPEC 1.009 12/16/2015 1533   PHURINE 6.5 12/16/2015 1533   GLUCOSEU NEGATIVE 12/16/2015 1533   HGBUR MODERATE (A) 12/16/2015 1533   BILIRUBINUR NEGATIVE 12/16/2015 1533   KETONESUR NEGATIVE 12/16/2015 1533   PROTEINUR NEGATIVE 12/16/2015 1533   UROBILINOGEN 1.0 12/02/2010 1023   NITRITE NEGATIVE 12/16/2015 1533   LEUKOCYTESUR NEGATIVE 12/16/2015 1533   Sepsis Labs: @LABRCNTIP (procalcitonin:4,lacticidven:4) )No results found for this or any previous visit (from the past 240  hour(s)).   Radiological Exams on Admission: Dg Chest 2 View  Result Date: 12/16/2015 CLINICAL DATA:  Chest pain and shortness of Breath EXAM: CHEST  2 VIEW COMPARISON:  11/27/2009 FINDINGS: Cardiac  shadow is enlarged. Postsurgical changes are again seen. The lungs are hyperinflated with some mid lung scarring on the left. Increased density in the right lung base is noted projecting in the right middle lobe on the lateral projection. Mild interstitial edema is noted particularly in the right lung base. No sizable effusion is seen. No acute bony abnormality is noted. IMPRESSION: Right middle lobe infiltrate. Mild interstitial edema is noted in the right lung base. Electronically Signed   By: Alcide Clever M.D.   On: 12/16/2015 17:06   Dg Shoulder Left  Result Date: 12/16/2015 CLINICAL DATA:  Left shoulder pain following fall 2 days ago, initial encounter EXAM: LEFT SHOULDER - 2+ VIEW COMPARISON:  None. FINDINGS: There is no evidence of fracture or dislocation. There is no evidence of arthropathy or other focal bone abnormality. Soft tissues are unremarkable. Old healed rib fractures are noted. IMPRESSION: No acute abnormality noted. Electronically Signed   By: Alcide Clever M.D.   On: 12/16/2015 17:06    EKG: Independently reviewed. Normal sinus rhythm with QTC of 604 ms.  Assessment/Plan Principal Problem:   Acute respiratory failure with hypoxia (HCC) Active Problems:   COPD exacerbation (HCC)   Atrial fibrillation with controlled ventricular response (HCC)   Acute congestive heart failure (HCC)   Community acquired pneumonia of right middle lobe of lung (HCC)    1. Acute respiratory failure with hypoxia - probably multifactorial and at this time primarily CHF decompensation. Patient also has signs of recent pneumonia and COPD. Patient has been placed on Lasix 40 mg IV every 12. Check 2-D echo troponin. Last EF as per cardiology note was 45-50%.Closely follow intake output metabolic panel  and daily weights. For pneumonia since patient has prolonged QT I have placed patient on doxycycline. Check urine for Legionella sputum cultures and strep antigen. And patient will be on DuoNeb's along with patient's steroid inhaler for COPD. Patient is on Cozaar. 2. Fall - not sure if patient had a near syncopal episode. Patient does have prolonged QT at 604 ms. Closely follow metabolic panel and avoid any QT prolongation medications. Replace potassium. I have requested for physical therapy consult and also closely following telemetry. Patient still has low back pain for which I have ordered a T-spine and L-spine and pelvic x-ray. 3. Paroxysmal atrial fibrillation - chads 2 vasc score is more than 2 but patient is not on anticoagulants secondary to recent GI bleed requiring 6 units of blood transfusion. Continue sotalol. 4. CAD status post CABG - check 2-D echo since patient is short of breath. Continue statins. On sotalol. 5. Chronic kidney disease stage III - follow metabolic panel. 6. Hypertension - on Cozaar. 7. Prolonged QT - closely follow metabolic panel. Check magnesium levels. Avoid QT prolongation medication.   DVT prophylaxis: Heparin. Code Status: Full code.  Family Communication: Patient's sister.  Disposition Plan: Home.  Consults called: None.  Admission status: Inpatient. Telemetry. Likely stay 2 days.    Eduard Clos MD [facility] Pager [phone].  If 7PM-7AM, please contact night-coverage [email or web address] Password [email or web address]  12/16/2015, 9:49 PM

## 2015-12-16 NOTE — ED Triage Notes (Signed)
Pt was recently d/c from hospital for GI bleed with hgb of 4. Pt saw PCP today for increasing falls, generalized weakness. Pt's Oxygen level was 86% at doctor's office, O2, here is 82% on room air. Pt placed on 2L O2. Pt A&Ox4 at this time. Per family, pt has been hallucinating, seeing people and chickens in her home that haven't been there.

## 2015-12-16 NOTE — ED Notes (Signed)
Ambulated with Pulse ox=94%

## 2015-12-16 NOTE — ED Notes (Signed)
Pt reports SOB since Saturday.  Pt's sister states pt fell Saturday as well d/t weakness.  States she fell backwards landing on her back.  Pt reports back and L rib pain.  No bruising noted at this time

## 2015-12-16 NOTE — ED Notes (Signed)
Report will be given at bedside.

## 2015-12-16 NOTE — ED Notes (Signed)
Bed: [id number] Expected date:  Expected time:  Means of arrival:  Comments: Tr2

## 2015-12-17 ENCOUNTER — Inpatient Hospital Stay (HOSPITAL_COMMUNITY): Payer: Medicare Other

## 2015-12-17 DIAGNOSIS — I509 Heart failure, unspecified: Secondary | ICD-10-CM

## 2015-12-17 LAB — BASIC METABOLIC PANEL
Anion gap: 6 (ref 5–15)
BUN: 11 mg/dL (ref 6–20)
CO2: 32 mmol/L (ref 22–32)
Calcium: 8.4 mg/dL — ABNORMAL LOW (ref 8.9–10.3)
Chloride: 102 mmol/L (ref 101–111)
Creatinine, Ser: 0.94 mg/dL (ref 0.44–1.00)
GFR calc Af Amer: >60 mL/min (ref >60)
GFR calc non Af Amer: 59 mL/min — ABNORMAL LOW (ref >60)
Glucose, Bld: 159 mg/dL — ABNORMAL HIGH (ref 65–99)
Potassium: 3.6 mmol/L (ref 3.5–5.1)
Sodium: 140 mmol/L (ref 135–145)

## 2015-12-17 LAB — CBC
HCT: 33.8 % — ABNORMAL LOW (ref 36.0–46.0)
Hemoglobin: 10.9 g/dL — ABNORMAL LOW (ref 12.0–15.0)
MCH: 31.1 pg (ref 26.0–34.0)
MCHC: 32.2 g/dL (ref 30.0–36.0)
MCV: 96.3 fL (ref 78.0–100.0)
Platelets: 98 10*3/uL — ABNORMAL LOW (ref 150–400)
RBC: 3.51 MIL/uL — ABNORMAL LOW (ref 3.87–5.11)
RDW: 19.3 % — ABNORMAL HIGH (ref 11.5–15.5)
WBC: 5.2 10*3/uL (ref 4.0–10.5)

## 2015-12-17 LAB — STREP PNEUMONIAE URINARY ANTIGEN: Strep Pneumo Urinary Antigen: NEGATIVE

## 2015-12-17 LAB — ECHOCARDIOGRAM COMPLETE
Height: 62 in
Weight: 3057.6 oz

## 2015-12-17 LAB — IRON AND TIBC
Iron: 35 ug/dL (ref 28–170)
Saturation Ratios: 11 % (ref 10.4–31.8)
TIBC: 329 ug/dL (ref 250–450)
UIBC: 294 ug/dL

## 2015-12-17 LAB — RETICULOCYTES
RBC.: 3.55 MIL/uL — ABNORMAL LOW (ref 3.87–5.11)
Retic Count, Absolute: 92.3 10*3/uL (ref 19.0–186.0)
Retic Ct Pct: 2.6 % (ref 0.4–3.1)

## 2015-12-17 LAB — FOLATE: Folate: 10.3 ng/mL (ref >5.9)

## 2015-12-17 LAB — TSH: TSH: 1.147 u[IU]/mL (ref 0.350–4.500)

## 2015-12-17 LAB — FERRITIN: Ferritin: 39 ng/mL (ref 11–307)

## 2015-12-17 LAB — MRSA PCR SCREENING: MRSA by PCR: NEGATIVE

## 2015-12-17 LAB — VITAMIN B12: Vitamin B-12: 1167 pg/mL — ABNORMAL HIGH (ref 180–914)

## 2015-12-17 MED ORDER — DEXTROSE 5 % IV SOLN
1.0000 g | INTRAVENOUS | Status: DC
  Filled 2015-12-17: qty 10

## 2015-12-17 MED ORDER — POTASSIUM CHLORIDE CRYS ER 20 MEQ PO TBCR: 40.0000 meq | EXTENDED_RELEASE_TABLET | Freq: Two times a day (BID) | ORAL | Status: DC

## 2015-12-17 MED ORDER — POTASSIUM CHLORIDE CRYS ER 20 MEQ PO TBCR
40.0000 meq | EXTENDED_RELEASE_TABLET | Freq: Two times a day (BID) | ORAL | Status: AC
  Administered 2015-12-17 (×2): 40 meq via ORAL
  Filled 2015-12-17 (×2): qty 2

## 2015-12-17 MED ORDER — IPRATROPIUM-ALBUTEROL 0.5-2.5 (3) MG/3ML IN SOLN
3.0000 mL | Freq: Four times a day (QID) | RESPIRATORY_TRACT | Status: DC
  Administered 2015-12-17 – 2015-12-19 (×8): 3 mL via RESPIRATORY_TRACT
  Filled 2015-12-17 (×9): qty 3

## 2015-12-17 MED ORDER — MAGNESIUM SULFATE 2 GM/50ML IV SOLN
2.0000 g | Freq: Once | INTRAVENOUS | Status: AC
  Administered 2015-12-17: 2 g via INTRAVENOUS
  Filled 2015-12-17: qty 50

## 2015-12-17 NOTE — Progress Notes (Addendum)
[facility] PROGRESS NOTE    Progress Note  Gabrielle [name]  ZOX:[id number] DOB: [birth year] DOA: 12/16/2015 PCP: Cain SaupeFULP, CAMMIE, MD     Brief Narrative:   Gabrielle [name] is an 70 y.o. female past medical history of CAD status post CABG, paroxysmal atrial fibrillation, chronic systolic heart failure was referred to the ED by her PCP for shortness of breath, the patient felt 4 days prior to admission, she did not loose consciousness since the fall she's been having back pain she said her PCP on the day of admission found her to be hypoxic and send her to the ED, M.D. she was found to have bilateral wheezing and crackles chest x-ray showing possible pneumonia with vascular congestion.  Assessment/Plan:   Acute respiratory failure with hypoxia (HCC): Probably due to acute decompensated heart failure. DC IV antibiotics and continue IV Lasix, follow strict is and os, daily weights. 2-D echo is pending, cardiac biomarkers have been negative 2. Will add potassium supplementation potassium orally.  Falls: Not sure question near syncopal has anything to do with the prolonged QTC of 600. PT consult is pending. Recheck a basic metabolic panel and a magnesium level in the morning. COPD exacerbation (HCC)  Atrial fibrillation with controlled ventricular response (HCC): chads 2 vasc score is more than 2 Not on anticoagulation due to GI bleed. Continue sotalol.  Prolonged QT: likely due to hypomagnesemia, her magnesium was 1.5, 4 g of IV magnesium. Continue to replace potassium and magnesium keep potassium greater than 4 magnesium greater than 2. She is also low which is likely contributing to her long QT.  Normocytic anemia: Borderline high MCV and an elevated RDW likely iron deficiency anemia, I'll get an anemia panel.   DVT prophylaxis: lovenox Family Communication:none Disposition Plan/Barrier to D/C: unable to detemrine Code Status:     Code Status Orders        Start     Ordered   12/16/15 2148  Full code  Continuous     12/16/15 2148    Code Status History    Date Active Date Inactive Code Status Order ID Comments User Context   10/27/2015  4:16 PM 10/30/2015  6:03 PM Full Code [id number]  Meredith PelPaula M Guenther, NP ED   10/21/2015  6:50 PM 10/25/2015  8:23 PM Full Code [id number]  Jeralyn BennettEzequiel Zamora, MD Inpatient        IV Access:    Peripheral IV   Procedures and diagnostic studies:   Dg Chest 2 View  Result Date: 12/16/2015 CLINICAL DATA:  Chest pain and shortness of Breath EXAM: CHEST  2 VIEW COMPARISON:  11/27/2009 FINDINGS: Cardiac shadow is enlarged. Postsurgical changes are again seen. The lungs are hyperinflated with some mid lung scarring on the left. Increased density in the right lung base is noted projecting in the right middle lobe on the lateral projection. Mild interstitial edema is noted particularly in the right lung base. No sizable effusion is seen. No acute bony abnormality is noted. IMPRESSION: Right middle lobe infiltrate. Mild interstitial edema is noted in the right lung base. Electronically Signed   By: Alcide CleverMark  Lukens M.D.   On: 12/16/2015 17:06   Dg Thoracic Spine 2 View  Result Date: 12/17/2015 CLINICAL DATA:  Back pain.  Fall. EXAM: THORACIC SPINE 2 VIEWS COMPARISON:  12/16/2015. FINDINGS: No acute bony abnormality identified. Degenerative changes thoracic spine. Stable minimal upper thoracic vertebral body compressions. Aortic atherosclerotic vascular calcification. Prior CABG. IMPRESSION: 1. Diffuse degenerative changes thoracic spine. Old mild  upper thoracic compressions noted. No acute abnormality. 2. Aortic atherosclerotic vascular disease.  Prior CABG. Electronically Signed   By: Maisie Fus  Register   On: 12/17/2015 07:14   Dg Lumbar Spine Complete  Result Date: 12/17/2015 CLINICAL DATA:  Shoulder pain.  Fall. EXAM: LUMBAR SPINE - COMPLETE 4+ VIEW COMPARISON:  Ultrasound 07/30/2015.  MRI 08/16/2013.  CT 12/01/2010.  FINDINGS: Diffuse multilevel degenerative change. 4 mm anterolisthesis L4 on L5. Mild L3 vertebral body compression. This is new from prior study of 08/16/2013. Aortic atherosclerotic vascular calcification. Aneurysmal dilatation of the distal abdominal aorta 3 cm cannot be excluded. Abdominal aortic ultrasound can be obtained for further evaluation . Left nephrolithiasis again noted. IMPRESSION: 1. Mild L3 vertebral body compression. This is new from prior study of 08/16/2013. 2. Diffuse degenerative change lumbar spine with 4 mm anterolisthesis L4 on L5. 3.Aortic atherosclerotic vascular disease. Aneurysmal dilatation of the distal abdominal aorta to 3 cm cannot be excluded. Abdominal aortic ultrasound can be obtained for further evaluation. Left nephrolithiasis again noted. 4. Left nephrolithiasis again noted. Electronically Signed   By: Maisie Fus  Register   On: 12/17/2015 07:21   Dg Pelvis 1-2 Views  Result Date: 12/17/2015 CLINICAL DATA:  Back pain.  Fall. EXAM: PELVIS - 1-2 VIEW COMPARISON:  CT 12/01/2010. FINDINGS: Degenerative changes lumbar spine and both hips. No acute bony abnormality. Vascular calcification. IMPRESSION: Degenerative changes lumbar spine and both hips. No acute bony abnormality. Electronically Signed   By: Maisie Fus  Register   On: 12/17/2015 07:19   Dg Shoulder Left  Result Date: 12/16/2015 CLINICAL DATA:  Left shoulder pain following fall 2 days ago, initial encounter EXAM: LEFT SHOULDER - 2+ VIEW COMPARISON:  None. FINDINGS: There is no evidence of fracture or dislocation. There is no evidence of arthropathy or other focal bone abnormality. Soft tissues are unremarkable. Old healed rib fractures are noted. IMPRESSION: No acute abnormality noted. Electronically Signed   By: Alcide Clever M.D.   On: 12/16/2015 17:06     Medical Consultants:    None.  Anti-Infectives:   IV Rocephin and start on 12/17/2015 Doxycycline started on 12/16/2015.  Subjective:    Gabrielle [name] she relates she continues to be short of breath.  Objective:    Vitals:   12/16/15 2157 12/17/15 0224 12/17/15 0533 12/17/15 0820  BP: (!) 138/59  (!) 150/64   Pulse: (!) 52  (!) 54   Resp: 16  16   Temp: 97.9 F (36.6 C)  98.3 F (36.8 C)   TempSrc: Oral  Oral   SpO2: 95% 94% 96% 95%  Weight: 86.6 kg (191 lb)  86.7 kg (191 lb 1.6 oz)   Height: 5\' 2"  (1.575 m)       Intake/Output Summary (Last 24 hours) at 12/17/15 0824 Last data filed at 12/17/15 0752  Gross per 24 hour  Intake                0 ml  Output              500 ml  Net             -500 ml   Filed Weights   12/16/15 2100 12/16/15 2157 12/17/15 0533  Weight: 87 kg (191 lb 11.2 oz) 86.6 kg (191 lb) 86.7 kg (191 lb 1.6 oz)    Exam: General exam: In no acute distress. Respiratory system: Good air movement and Crackles on the right lung base. Cardiovascular system: S1 & S2 heard, RRR. Positive JVD  Gastrointestinal system: Abdomen is nondistended, soft and nontender.  Central nervous system: Alert and oriented. No focal neurological deficits. Extremities: No pedal edema. Skin: No rashes, lesions or ulcers Psychiatry: Judgement and insight appear normal. Mood & affect appropriate.    Data Reviewed:    Labs: Basic Metabolic Panel:  Recent Labs Lab 12/16/15 1545 12/16/15 2201 12/17/15 0345  NA 140  --  140  K 3.1*  --  3.6  CL 102  --  102  CO2 32  --  32  GLUCOSE 76  --  159*  BUN 10  --  11  CREATININE 1.01*  --  0.94  CALCIUM 8.7*  --  8.4*  MG  --  1.5*  --    GFR Estimated Creatinine Clearance: 55.3 mL/min (by C-G formula based on SCr of 0.94 mg/dL). Liver Function Tests: No results for input(s): AST, ALT, ALKPHOS, BILITOT, PROT, ALBUMIN in the last 168 hours. No results for input(s): LIPASE, AMYLASE in the last 168 hours. No results for input(s): AMMONIA in the last 168 hours. Coagulation profile No results for input(s): INR, PROTIME in the last 168 hours.  CBC:  Recent  Labs Lab 12/16/15 1545 12/17/15 0345  WBC 8.7 5.2  HGB 12.2 10.9*  HCT 38.3 33.8*  MCV 97.2 96.3  PLT 126* 98*   Cardiac Enzymes:  Recent Labs Lab 12/16/15 2201  TROPONINI <0.03   BNP (last 3 results) No results for input(s): PROBNP in the last 8760 hours. CBG:  Recent Labs Lab 12/16/15 1544  GLUCAP 73   D-Dimer: No results for input(s): DDIMER in the last 72 hours. Hgb A1c: No results for input(s): HGBA1C in the last 72 hours. Lipid Profile: No results for input(s): CHOL, HDL, LDLCALC, TRIG, CHOLHDL, LDLDIRECT in the last 72 hours. Thyroid function studies:  Recent Labs  12/17/15 0345  TSH 1.147   Anemia work up: No results for input(s): VITAMINB12, FOLATE, FERRITIN, TIBC, IRON, RETICCTPCT in the last 72 hours. Sepsis Labs:  Recent Labs Lab 12/16/15 1545 12/17/15 0345  WBC 8.7 5.2   Microbiology Recent Results (from the past 240 hour(s))  MRSA PCR Screening     Status: None   Collection Time: 12/16/15  9:18 PM  Result Value Ref Range Status   MRSA by PCR NEGATIVE NEGATIVE Final    Comment:        The GeneXpert MRSA Assay (FDA approved for NASAL specimens only), is one component of a comprehensive MRSA colonization surveillance program. It is not intended to diagnose MRSA infection nor to guide or monitor treatment for MRSA infections.      Medications:   . atorvastatin  20 mg Oral QPM  . citalopram  20 mg Oral BID  . doxycycline (VIBRAMYCIN) IV  100 mg Intravenous Q12H  . furosemide  40 mg Intravenous Q12H  . heparin  5,000 Units Subcutaneous Q8H  . ipratropium-albuterol  3 mL Nebulization Q4H  . losartan  50 mg Oral Daily  . mometasone-formoterol  2 puff Inhalation BID  . pantoprazole  40 mg Oral QAC breakfast  . sodium chloride flush  3 mL Intravenous Q12H  . sotalol  80 mg Oral Daily   Continuous Infusions:     LOS: 1 day   Marinda Elk  [facility] Pager [phone]  *Please refer to [email or web address], password [email or web address]  to get updated schedule on who will round on this patient, as hospitalists switch teams weekly. If 7PM-7AM, please contact night-coverage at [email or web address], password [email or web address] for any overnight  needs.  12/17/2015, 8:24 AM

## 2015-12-17 NOTE — Evaluation (Signed)
Physical Therapy Evaluation Patient Details Name: Gabrielle [name] MRN: [id number] DOB: 11-20-1945 Today's Date: 12/17/2015   History of Present Illness  Pt is a 71 y.o. female with PMH of CAD status post CABG, paroxysmal atrial fibrillation, chronic systolic heart failure, COPD, hypertension, hyperlipidemia admitted for acute respiratory failure with hypoxia.  Clinical Impression  Pt admitted with above. Pt currently with functional limitations due to the deficits listed below (see PT Problem List). Pt will benefit from skilled PT to increase their independence and safety with mobility to allow discharge. Pt reported feeling fatigued after transferring to [facility], refused to participate in gait or further PT assessment. Pt declines HHPT and SNF, reporting that she does not want or need additional PT at home where she lives alone with occasional help from sister. Due to decreased endurance, history of falls, and decreased support upon discharge, recommending SNF before home. Will continue to attempt working with this pt and educate on home safety.     Follow Up Recommendations SNF (refused SNF and HHPT but recommending PT before home alone)    Equipment Recommendations  None recommended by PT    Recommendations for Other Services       Precautions / Restrictions Precautions Precautions: Fall;Back Precaution Comments: log rolling for safety secondary to acute L3 and hx of thoracic vertebral compression fxs Restrictions Weight Bearing Restrictions: No      Mobility  Bed Mobility Overal bed mobility: Needs Assistance Bed Mobility: Rolling;Sidelying to Sit;Sit to Sidelying Rolling: Min guard Sidelying to sit: Min guard;HOB elevated     Sit to sidelying: Min guard General bed mobility comments: guarding for safety; unable to recall back precautions, required verbal cues to perform log rolling  Transfers Overall transfer level: Needs assistance Equipment used: Rolling walker (2  wheeled) Transfers: Sit to/from [employer] Sit to Stand: Min guard Stand pivot transfers: Min guard       General transfer comment: guarding for safety; verbal cues for UE positioning on RW and for controlled rise and descent  Ambulation/Gait             General Gait Details: pt did not want to participate in gait at this time  Stairs            Wheelchair Mobility    Modified Rankin (Stroke Patients Only)       Balance Overall balance assessment: History of Falls;Needs assistance         Standing balance support: Bilateral upper extremity supported;During functional activity Standing balance-Leahy Scale: Poor Standing balance comment: required RW for UE support and balance during transfer                             Pertinent Vitals/Pain Pain Assessment: Faces Faces Pain Scale: Hurts a little bit Pain Location: back Pain Descriptors / Indicators: Discomfort Pain Intervention(s): Limited activity within patient's tolerance;Monitored during session;Repositioned    Home Living Family/patient expects to be discharged to:: Private residence Living Arrangements: Alone Available Help at Discharge: Family;Available PRN/intermittently (pt reports that sister checks on her 2-3 times/week) Type of Home: House Home Access: Stairs to enter Entrance Stairs-Rails: Right;Left;Can reach both Entrance Stairs-Number of Steps: 14 (6 onto porch, 8 from porch to main living area) Home Layout: One level Home Equipment: Cane - single point;Bedside commode;Walker - 2 wheels Additional Comments: pt states that she also has a dog to take care of at her home    Prior Function Level of  Independence: Independent with assistive device(s) (pt reports she is able to manage ADLs alone with RW)         Comments: pt reports multiple falls but reports that she does not want or need anyone to help her at home, with exception of her sister PRN     Hand  Dominance        Extremity/Trunk Assessment               Lower Extremity Assessment: Generalized weakness         Communication   Communication: No difficulties  Cognition Arousal/Alertness: Awake/alert Behavior During Therapy: WFL for tasks assessed/performed Overall Cognitive Status: Within Functional Limits for tasks assessed                      General Comments General comments (skin integrity, edema, etc.): [facility] O2 was 88%; pt reported SOB and refused further participation in PT for today    Exercises     Assessment/Plan    PT Assessment Patient needs continued PT services  PT Problem List Decreased strength;Decreased activity tolerance;Decreased balance;Decreased mobility;Decreased knowledge of use of DME;Decreased safety awareness          PT Treatment Interventions DME instruction;Gait training;Stair training;Functional mobility training;Therapeutic activities;Therapeutic exercise;Balance training;Patient/family education    PT Goals (Current goals can be found in the Care Plan section)  Acute Rehab PT Goals Patient Stated Goal: to be independent at home with mobility and ADLs PT Goal Formulation: With patient Time For Goal Achievement: 12/24/15 Potential to Achieve Goals: Fair (lacks safety awareness and reports non-compliance with PT)    Frequency Min 3X/week   Barriers to discharge        Co-evaluation               End of Session Equipment Utilized During Treatment: Gait belt;Oxygen Activity Tolerance: Patient limited by fatigue Patient left: in bed;with call bell/phone within reach;with bed alarm set Nurse Communication: Mobility status         Time: [id number] PT Time Calculation (min) (ACUTE ONLY): 22 min   Charges:   PT Evaluation $PT Eval Moderate Complexity: 1 Procedure     PT G CodesKaralee Height:        Jeffey Janssen 12/17/2015, 1:13 PM Karalee Heightachel Stokes Rattigan, SPT

## 2015-12-17 NOTE — Progress Notes (Signed)
  Echocardiogram 2D Echocardiogram has been performed.  Nolon RodBrown, Tony 12/17/2015, 2:23 PM

## 2015-12-18 ENCOUNTER — Inpatient Hospital Stay (HOSPITAL_COMMUNITY): Payer: Medicare Other

## 2015-12-18 ENCOUNTER — Telehealth: Payer: Self-pay | Admitting: Cardiology

## 2015-12-18 LAB — BASIC METABOLIC PANEL
Anion gap: 7 (ref 5–15)
BUN: 10 mg/dL (ref 6–20)
CO2: 33 mmol/L — ABNORMAL HIGH (ref 22–32)
Calcium: 8.8 mg/dL — ABNORMAL LOW (ref 8.9–10.3)
Chloride: 103 mmol/L (ref 101–111)
Creatinine, Ser: 0.95 mg/dL (ref 0.44–1.00)
GFR calc Af Amer: >60 mL/min (ref >60)
GFR calc non Af Amer: 58 mL/min — ABNORMAL LOW (ref >60)
Glucose, Bld: 106 mg/dL — ABNORMAL HIGH (ref 65–99)
Potassium: 3.7 mmol/L (ref 3.5–5.1)
Sodium: 143 mmol/L (ref 135–145)

## 2015-12-18 LAB — EXPECTORATED SPUTUM ASSESSMENT W GRAM STAIN, RFLX TO RESP C

## 2015-12-18 LAB — MAGNESIUM: Magnesium: 1.6 mg/dL — ABNORMAL LOW (ref 1.7–2.4)

## 2015-12-18 LAB — EXPECTORATED SPUTUM ASSESSMENT W REFEX TO RESP CULTURE

## 2015-12-18 MED ORDER — CIPROFLOXACIN-FLUOCINOLONE PF 0.3-0.025 % OT SOLN
0.2500 mL | Freq: Two times a day (BID) | OTIC | Status: DC
  Filled 2015-12-18 (×2): qty 0.25

## 2015-12-18 MED ORDER — GUAIFENESIN ER 600 MG PO TB12
600.0000 mg | ORAL_TABLET | Freq: Two times a day (BID) | ORAL | Status: DC
  Administered 2015-12-18 – 2015-12-28 (×17): 600 mg via ORAL
  Filled 2015-12-18 (×19): qty 1

## 2015-12-18 MED ORDER — FLUTICASONE PROPIONATE 50 MCG/ACT NA SUSP
2.0000 | Freq: Every day | NASAL | Status: DC
  Administered 2015-12-18 – 2015-12-20 (×3): 2 via NASAL
  Filled 2015-12-18: qty 16

## 2015-12-18 MED ORDER — POTASSIUM CHLORIDE CRYS ER 20 MEQ PO TBCR
40.0000 meq | EXTENDED_RELEASE_TABLET | Freq: Once | ORAL | Status: AC
  Administered 2015-12-18: 40 meq via ORAL
  Filled 2015-12-18: qty 2

## 2015-12-18 MED ORDER — DOXYCYCLINE HYCLATE 100 MG PO TABS
100.0000 mg | ORAL_TABLET | Freq: Two times a day (BID) | ORAL | Status: DC
  Administered 2015-12-18 – 2015-12-22 (×8): 100 mg via ORAL
  Filled 2015-12-18 (×8): qty 1

## 2015-12-18 MED ORDER — MAGNESIUM SULFATE 2 GM/50ML IV SOLN
2.0000 g | Freq: Once | INTRAVENOUS | Status: AC
  Administered 2015-12-18: 2 g via INTRAVENOUS
  Filled 2015-12-18: qty 50

## 2015-12-18 NOTE — Telephone Encounter (Signed)
New message  Pt call requesting to speak with RN. Pt states she is in the hospital and would like to inform RN and provider. Please call back to discuss

## 2015-12-18 NOTE — Telephone Encounter (Signed)
Routing to [employer] as well.

## 2015-12-18 NOTE — Telephone Encounter (Signed)
Pt cb. She states she wanted to let Dr Anne FuSkains know she was [facility] and [facility]. She states she would like Dr Anne FuSkains to "look and see if they are doing everything right". She states she is very sick and concerned about her medication. I advised her I would [facility] a [facility] and Dr Kathie RhodesS. She requests a cb once Dr Kathie RhodesS reviews her chart.

## 2015-12-18 NOTE — Telephone Encounter (Signed)
LMTCB

## 2015-12-18 NOTE — Progress Notes (Signed)
[facility] PROGRESS NOTE    Progress Note  Gabrielle [name]  ZOX:[id number] DOB: 1945/06/13 DOA: 12/16/2015 PCP: [name], MD     Brief Narrative:   Gabrielle [name] is an 70 y.o. female past medical history of CAD status post CABG, paroxysmal atrial fibrillation, chronic systolic heart failure was referred to the ED by her PCP for shortness of breath, the patient felt 4 days prior to admission, she did not loose consciousness since the fall she's been having back pain she said her PCP on the day of admission found her to be hypoxic and send her to the ED, M.D. she was found to have bilateral wheezing and crackles chest x-ray showing possible pneumonia with vascular congestion.  Assessment/Plan:   Acute respiratory failure with hypoxia (HCC): Probably due to acute decompensated diastolic heart failure. 2-D echo lvef wnl, does has diastolic dysfunction, cardiac biomarkers have been negative 2. DC IV antibiotics and continue IV Lasix, follow strict is and os, daily weights. Still has crackles and lower extremity edema, continue diuresis, wean oxygen   Atrial fibrillation with controlled ventricular response (HCC): chads 2 vasc score is more than 2 Not on anticoagulation due to GI bleed. Cardiology consulted regarding sotalol in the setting of prolonged QT who recommended d/c sotalol  Prolonged QT: D/c celexa,  Keep K.4, mag >2, Cardiology consulted regarding sotalol use in the setting of prolonged QT who recommended d/c sotalol  HTN; continue cozaar   Thrombocytopenia: Unclear etiology, seems slowing improving  Normocytic anemia: Borderline high MCV and an elevated RDW likely iron deficiency anemia, I'll get an anemia panel.  Right ear pain, right mastoid process tenderness Otoscope exam reveal opaque tympanic member , no fluids level, no perforation Will get ct maxillofacial, will start ear drops, flonase, doxycycline ( not a candidate for quinolones due to  QTc, not a candidate for augmentin due to she is allergic to pcn)  Falls/FTT/poor memory: Not sure question near syncopal has anything to do with the prolonged QTC of 600. PT recommended SNF , patient declines, family requested assisted living placement, social worker consulted  Prior smoker, quit smoking several months ago, h/o copd, no significant wheezing, does has rhonchi and crackles, will get ct chest, continue nebs, add mucinex   DVT prophylaxis: lovenox Family Communication: twin sister at bedside Disposition Plan/Barrier to D/C: PT recommended SNf, patient declined, family want to try assisted living, social worker consulted Code Status:     Code Status Orders        Start     Ordered   12/16/15 2148  Full code  Continuous     12/16/15 2148    Code Status History    Date Active Date Inactive Code Status Order ID Comments User Context   10/27/2015  4:16 PM 10/30/2015  6:03 PM Full Code [id number]  Meredith Pel, NP ED   10/21/2015  6:50 PM 10/25/2015  8:23 PM Full Code [id number]  Jeralyn Bennett, MD Inpatient        IV Access:    Peripheral IV   Procedures and diagnostic studies:   Dg Chest 2 View  Result Date: 12/16/2015 CLINICAL DATA:  Chest pain and shortness of Breath EXAM: CHEST  2 VIEW COMPARISON:  11/27/2009 FINDINGS: Cardiac shadow is enlarged. Postsurgical changes are again seen. The lungs are hyperinflated with some mid lung scarring on the left. Increased density in the right lung base is noted projecting in the right middle lobe on the lateral projection. Mild interstitial edema  is noted particularly in the right lung base. No sizable effusion is seen. No acute bony abnormality is noted. IMPRESSION: Right middle lobe infiltrate. Mild interstitial edema is noted in the right lung base. Electronically Signed   By: Alcide Clever M.D.   On: 12/16/2015 17:06   Dg Thoracic Spine 2 View  Result Date: 12/17/2015 CLINICAL DATA:  Back pain.  Fall. EXAM: THORACIC  SPINE 2 VIEWS COMPARISON:  12/16/2015. FINDINGS: No acute bony abnormality identified. Degenerative changes thoracic spine. Stable minimal upper thoracic vertebral body compressions. Aortic atherosclerotic vascular calcification. Prior CABG. IMPRESSION: 1. Diffuse degenerative changes thoracic spine. Old mild upper thoracic compressions noted. No acute abnormality. 2. Aortic atherosclerotic vascular disease.  Prior CABG. Electronically Signed   By: Maisie Fus  Register   On: 12/17/2015 07:14   Dg Lumbar Spine Complete  Result Date: 12/17/2015 CLINICAL DATA:  Shoulder pain.  Fall. EXAM: LUMBAR SPINE - COMPLETE 4+ VIEW COMPARISON:  Ultrasound 07/30/2015.  MRI 08/16/2013.  CT 12/01/2010. FINDINGS: Diffuse multilevel degenerative change. 4 mm anterolisthesis L4 on L5. Mild L3 vertebral body compression. This is new from prior study of 08/16/2013. Aortic atherosclerotic vascular calcification. Aneurysmal dilatation of the distal abdominal aorta 3 cm cannot be excluded. Abdominal aortic ultrasound can be obtained for further evaluation . Left nephrolithiasis again noted. IMPRESSION: 1. Mild L3 vertebral body compression. This is new from prior study of 08/16/2013. 2. Diffuse degenerative change lumbar spine with 4 mm anterolisthesis L4 on L5. 3.Aortic atherosclerotic vascular disease. Aneurysmal dilatation of the distal abdominal aorta to 3 cm cannot be excluded. Abdominal aortic ultrasound can be obtained for further evaluation. Left nephrolithiasis again noted. 4. Left nephrolithiasis again noted. Electronically Signed   By: Maisie Fus  Register   On: 12/17/2015 07:21   Dg Pelvis 1-2 Views  Result Date: 12/17/2015 CLINICAL DATA:  Back pain.  Fall. EXAM: PELVIS - 1-2 VIEW COMPARISON:  CT 12/01/2010. FINDINGS: Degenerative changes lumbar spine and both hips. No acute bony abnormality. Vascular calcification. IMPRESSION: Degenerative changes lumbar spine and both hips. No acute bony abnormality. Electronically Signed    By: Maisie Fus  Register   On: 12/17/2015 07:19   Dg Shoulder Left  Result Date: 12/16/2015 CLINICAL DATA:  Left shoulder pain following fall 2 days ago, initial encounter EXAM: LEFT SHOULDER - 2+ VIEW COMPARISON:  None. FINDINGS: There is no evidence of fracture or dislocation. There is no evidence of arthropathy or other focal bone abnormality. Soft tissues are unremarkable. Old healed rib fractures are noted. IMPRESSION: No acute abnormality noted. Electronically Signed   By: Alcide Clever M.D.   On: 12/16/2015 17:06     Medical Consultants:    cardiology  Anti-Infectives:   IV Rocephin and start on 12/17/2015 Doxycycline started on 12/16/2015.  Subjective:    [name] she relates she continues to be short of breath.  Objective:    Vitals:   12/18/15 0203 12/18/15 0429 12/18/15 1337 12/18/15 1437  BP:  (!) 166/60 (!) 142/54   Pulse:  62 (!) 57 (!) 58  Resp:  (!) 22 20 16   Temp:  98.2 F (36.8 C) 98.4 F (36.9 C)   TempSrc:  Oral Oral   SpO2: 96% 94% 100% 97%  Weight:  83 kg (183 lb)    Height:        Intake/Output Summary (Last 24 hours) at 12/18/15 1542 Last data filed at 12/18/15 1120  Gross per 24 hour  Intake  600 ml  Output             3000 ml  Net            -2400 ml   Filed Weights   12/16/15 2157 12/17/15 0533 12/18/15 0429  Weight: 86.6 kg (191 lb) 86.7 kg (191 lb 1.6 oz) 83 kg (183 lb)    Exam: General exam: In no acute distress. Respiratory system:  Crackles on the right lung base. Cardiovascular system: S1 & S2 heard, RRR. Positive JVD Gastrointestinal system: Abdomen is nondistended, soft and nontender.  Central nervous system: Alert and oriented. No focal neurological deficits. Extremities: + lower extremity pitting edema. Skin: No rashes, lesions or ulcers Psychiatry: Judgement and insight appear normal. Mood & affect appropriate.    Data Reviewed:    Labs: Basic Metabolic Panel:  Recent Labs Lab 12/16/15 1545  12/16/15 2201 12/17/15 0345 12/18/15 0501  NA 140  --  140 143  K 3.1*  --  3.6 3.7  CL 102  --  102 103  CO2 32  --  32 33*  GLUCOSE 76  --  159* 106*  BUN 10  --  11 10  CREATININE 1.01*  --  0.94 0.95  CALCIUM 8.7*  --  8.4* 8.8*  MG  --  1.5*  --  1.6*   GFR Estimated Creatinine Clearance: 53.5 mL/min (by C-G formula based on SCr of 0.95 mg/dL). Liver Function Tests: No results for input(s): AST, ALT, ALKPHOS, BILITOT, PROT, ALBUMIN in the last 168 hours. No results for input(s): LIPASE, AMYLASE in the last 168 hours. No results for input(s): AMMONIA in the last 168 hours. Coagulation profile No results for input(s): INR, PROTIME in the last 168 hours.  CBC:  Recent Labs Lab 12/16/15 1545 12/17/15 0345  WBC 8.7 5.2  HGB 12.2 10.9*  HCT 38.3 33.8*  MCV 97.2 96.3  PLT 126* 98*   Cardiac Enzymes:  Recent Labs Lab 12/16/15 2201  TROPONINI <0.03   BNP (last 3 results) No results for input(s): PROBNP in the last 8760 hours. CBG:  Recent Labs Lab 12/16/15 1544  GLUCAP 73   D-Dimer: No results for input(s): DDIMER in the last 72 hours. Hgb A1c: No results for input(s): HGBA1C in the last 72 hours. Lipid Profile: No results for input(s): CHOL, HDL, LDLCALC, TRIG, CHOLHDL, LDLDIRECT in the last 72 hours. Thyroid function studies:  Recent Labs  12/17/15 0345  TSH 1.147   Anemia work up:  Recent Labs  12/17/15 1005  VITAMINB12 1,167*  FOLATE 10.3  FERRITIN 39  TIBC 329  IRON 35  RETICCTPCT 2.6   Sepsis Labs:  Recent Labs Lab 12/16/15 1545 12/17/15 0345  WBC 8.7 5.2   Microbiology Recent Results (from the past 240 hour(s))  MRSA PCR Screening     Status: None   Collection Time: 12/16/15  9:18 PM  Result Value Ref Range Status   MRSA by PCR NEGATIVE NEGATIVE Final    Comment:        The GeneXpert MRSA Assay (FDA approved for NASAL specimens only), is one component of a comprehensive MRSA colonization surveillance program. It is  not intended to diagnose MRSA infection nor to guide or monitor treatment for MRSA infections.   Culture, sputum-assessment     Status: None   Collection Time: 12/18/15  7:03 AM  Result Value Ref Range Status   Specimen Description SPUTUM  Final   Special Requests NONE  Final   Sputum evaluation  Final    THIS SPECIMEN IS ACCEPTABLE. RESPIRATORY CULTURE REPORT TO FOLLOW.   Report Status 12/18/2015 FINAL  Final  Culture, respiratory (NON-Expectorated)     Status: None (Preliminary result)   Collection Time: 12/18/15  7:03 AM  Result Value Ref Range Status   Specimen Description SPUTUM  Final   Special Requests NONE  Final   Gram Stain   Final    MODERATE WBC PRESENT,BOTH PMN AND MONONUCLEAR FEW SQUAMOUS EPITHELIAL CELLS PRESENT ABUNDANT GRAM POSITIVE COCCI IN CLUSTERS MODERATE GRAM POSITIVE COCCI IN PAIRS FEW YEAST WITH PSEUDOHYPHAE FEW GRAM NEGATIVE COCCOBACILLI RARE GRAM NEGATIVE DIPLOCOCCI [facility]    Culture PENDING  Incomplete   Report Status PENDING  Incomplete     Medications:   . atorvastatin  20 mg Oral QPM  . furosemide  40 mg Intravenous Q12H  . heparin  5,000 Units Subcutaneous Q8H  . ipratropium-albuterol  3 mL Nebulization Q6H  . losartan  50 mg Oral Daily  . magnesium sulfate 1 - 4 g bolus IVPB  2 g Intravenous Once  . mometasone-formoterol  2 puff Inhalation BID  . pantoprazole  40 mg Oral QAC breakfast  . potassium chloride  40 mEq Oral Once  . sodium chloride flush  3 mL Intravenous Q12H  . sotalol  80 mg Oral Daily   Continuous Infusions:     LOS: 2 days   Kashis Penley MD PhD  [facility] Pager [phone]  *Please refer to [email or web address], password [email or web address] to get updated schedule on who will round on this patient, as hospitalists switch teams weekly. If 7PM-7AM, please contact night-coverage at [email or web address], password [email or web address] for any overnight needs.  12/18/2015, 3:42 PM

## 2015-12-19 ENCOUNTER — Encounter (HOSPITAL_COMMUNITY): Payer: Self-pay | Admitting: Student

## 2015-12-19 ENCOUNTER — Ambulatory Visit (HOSPITAL_COMMUNITY): Payer: Medicare Other

## 2015-12-19 DIAGNOSIS — R0602 Shortness of breath: Secondary | ICD-10-CM

## 2015-12-19 DIAGNOSIS — R609 Edema, unspecified: Secondary | ICD-10-CM

## 2015-12-19 DIAGNOSIS — R9431 Abnormal electrocardiogram [ECG] [EKG]: Secondary | ICD-10-CM

## 2015-12-19 DIAGNOSIS — W19XXXA Unspecified fall, initial encounter: Secondary | ICD-10-CM

## 2015-12-19 DIAGNOSIS — J441 Chronic obstructive pulmonary disease with (acute) exacerbation: Secondary | ICD-10-CM

## 2015-12-19 DIAGNOSIS — J9601 Acute respiratory failure with hypoxia: Secondary | ICD-10-CM

## 2015-12-19 DIAGNOSIS — I4891 Unspecified atrial fibrillation: Secondary | ICD-10-CM

## 2015-12-19 LAB — HEPATIC FUNCTION PANEL
ALT: 9 U/L — ABNORMAL LOW (ref 14–54)
AST: 33 U/L (ref 15–41)
Albumin: 3.4 g/dL — ABNORMAL LOW (ref 3.5–5.0)
Alkaline Phosphatase: 83 U/L (ref 38–126)
Bilirubin, Direct: 0.8 mg/dL — ABNORMAL HIGH (ref 0.1–0.5)
Indirect Bilirubin: 1.9 mg/dL — ABNORMAL HIGH (ref 0.3–0.9)
Total Bilirubin: 2.7 mg/dL — ABNORMAL HIGH (ref 0.3–1.2)
Total Protein: 5.9 g/dL — ABNORMAL LOW (ref 6.5–8.1)

## 2015-12-19 LAB — BASIC METABOLIC PANEL
Anion gap: 8 (ref 5–15)
BUN: 11 mg/dL (ref 6–20)
CO2: 33 mmol/L — ABNORMAL HIGH (ref 22–32)
Calcium: 9 mg/dL (ref 8.9–10.3)
Chloride: 101 mmol/L (ref 101–111)
Creatinine, Ser: 0.89 mg/dL (ref 0.44–1.00)
GFR calc Af Amer: >60 mL/min (ref >60)
GFR calc non Af Amer: >60 mL/min (ref >60)
Glucose, Bld: 101 mg/dL — ABNORMAL HIGH (ref 65–99)
Potassium: 4.3 mmol/L (ref 3.5–5.1)
Sodium: 142 mmol/L (ref 135–145)

## 2015-12-19 LAB — CBC WITH DIFFERENTIAL/PLATELET
Basophils Absolute: 0 10*3/uL (ref 0.0–0.1)
Basophils Relative: 0 %
Eosinophils Absolute: 0.2 10*3/uL (ref 0.0–0.7)
Eosinophils Relative: 2 %
HCT: 35.9 % — ABNORMAL LOW (ref 36.0–46.0)
Hemoglobin: 11.5 g/dL — ABNORMAL LOW (ref 12.0–15.0)
Lymphocytes Relative: 29 %
Lymphs Abs: 3.1 10*3/uL (ref 0.7–4.0)
MCH: 30.7 pg (ref 26.0–34.0)
MCHC: 32 g/dL (ref 30.0–36.0)
MCV: 95.7 fL (ref 78.0–100.0)
Monocytes Absolute: 1.3 10*3/uL — ABNORMAL HIGH (ref 0.1–1.0)
Monocytes Relative: 12 %
Neutro Abs: 6.1 10*3/uL (ref 1.7–7.7)
Neutrophils Relative %: 57 %
Platelets: 130 10*3/uL — ABNORMAL LOW (ref 150–400)
RBC: 3.75 MIL/uL — ABNORMAL LOW (ref 3.87–5.11)
RDW: 19.3 % — ABNORMAL HIGH (ref 11.5–15.5)
WBC: 10.7 10*3/uL — ABNORMAL HIGH (ref 4.0–10.5)

## 2015-12-19 LAB — GLUCOSE, CAPILLARY: Glucose-Capillary: 125 mg/dL — ABNORMAL HIGH (ref 65–99)

## 2015-12-19 LAB — MAGNESIUM: Magnesium: 1.9 mg/dL (ref 1.7–2.4)

## 2015-12-19 LAB — LEGIONELLA PNEUMOPHILA SEROGP 1 UR AG: L. pneumophila Serogp 1 Ur Ag: NEGATIVE

## 2015-12-19 MED ORDER — IPRATROPIUM-ALBUTEROL 0.5-2.5 (3) MG/3ML IN SOLN: 3.0000 mL | Freq: Four times a day (QID) | RESPIRATORY_TRACT | Status: DC | PRN

## 2015-12-19 MED ORDER — MAGNESIUM SULFATE 2 GM/50ML IV SOLN
2.0000 g | Freq: Once | INTRAVENOUS | Status: AC
  Administered 2015-12-19: 2 g via INTRAVENOUS
  Filled 2015-12-19: qty 50

## 2015-12-19 MED ORDER — CIPROFLOXACIN-DEXAMETHASONE 0.3-0.1 % OT SUSP
4.0000 [drp] | Freq: Two times a day (BID) | OTIC | Status: DC
  Administered 2015-12-19 – 2015-12-28 (×18): 4 [drp] via OTIC
  Filled 2015-12-19 (×3): qty 7.5

## 2015-12-19 MED ORDER — SENNOSIDES-DOCUSATE SODIUM 8.6-50 MG PO TABS
1.0000 | ORAL_TABLET | Freq: Every day | ORAL | Status: DC
  Administered 2015-12-19: 1 via ORAL
  Filled 2015-12-19: qty 1

## 2015-12-19 MED ORDER — IPRATROPIUM-ALBUTEROL 0.5-2.5 (3) MG/3ML IN SOLN
3.0000 mL | Freq: Three times a day (TID) | RESPIRATORY_TRACT | Status: DC
  Administered 2015-12-20 – 2015-12-21 (×5): 3 mL via RESPIRATORY_TRACT
  Filled 2015-12-19 (×8): qty 3

## 2015-12-19 NOTE — Clinical Social Work Note (Signed)
Medical Social Worker spoke with patient's sister, Bethena Roys at [employer] in reference to community resources for home assistance.   Patient's sister reported that patient has declined SNF placement however further reported that patient does not have Medicare days left to pay for SNF. MSW explained that patient does have Medicaid which will cover co pay days at [facility].   Patient's sister also reported family has had several conversations with patient in regards to LTC in ALF however patient continues to decline placement.   MSW shared information in regards to CAP program with Department of Social Services and advised patient's sister to assist patient with making inquiry for requirements and applying if qualifications are met.   Patient's sister would like for patient to return home with home health services.   No further concerns reported at this time. Pt's sister supportive, involved in care and appreciated social work intervention.   Medical Social Worker will sign off for now as social work intervention is no longer needed. Please consult [place] again if new need arises.  Glendon Axe, MSW [phone] 12/19/2015 10:31 AM

## 2015-12-19 NOTE — Progress Notes (Signed)
Physical Therapy Treatment Patient Details Name: Gabrielle [name] MRN: [id number] DOB: [birth year] Today's Date: 12/19/2015    History of Present Illness Pt is a 70 y.o. female with PMH of CAD status post CABG, paroxysmal atrial fibrillation, chronic systolic heart failure, COPD, hypertension, hyperlipidemia admitted for acute respiratory failure with hypoxia.    PT Comments    Pt ambulated in hallway and was assisted back to bed. SpO2 monitored during all mobility, initially on 2L SpO2 87% sitting on EOB. Increased to 3L O2, SpO2 90%. During gait, SpO2 fluctuated between 87-90% on 3L. Pt reported slight SOB, no other symptoms. Pt agreeable to continue acute PT.  Follow Up Recommendations  SNF;Supervision for mobility/OOB     Equipment Recommendations  None recommended by PT    Recommendations for Other Services       Precautions / Restrictions Precautions Precautions: Fall;Back Precaution Comments: log rolling for safety secondary to acute L3 and hx of thoracic vertebral compression fxs Restrictions Weight Bearing Restrictions: No    Mobility  Bed Mobility Overal bed mobility: Needs Assistance Bed Mobility: Rolling;Sidelying to Sit;Sit to Sidelying Rolling: Supervision Sidelying to sit: Supervision     Sit to sidelying: Supervision General bed mobility comments: supervision for safety; no verbal cues for safety or technique  Transfers Overall transfer level: Needs assistance Equipment used: Rolling walker (2 wheeled) Transfers: Sit to/from [employer] Transfers Sit to Stand: Min guard Stand pivot transfers: Min guard       General transfer comment: guarding for safety; verbal cues for UE positioning on RW and for controlled rise and descent  Ambulation/Gait Ambulation/Gait assistance: Min guard Ambulation Distance (Feet): 200 Feet Assistive device: Rolling walker (2 wheeled) Gait Pattern/deviations: Step-through pattern;Decreased stride length          Stairs            Wheelchair Mobility    Modified Rankin (Stroke Patients Only)       Balance                                    Cognition Arousal/Alertness: Awake/alert Behavior During Therapy: WFL for tasks assessed/performed Overall Cognitive Status: Within Functional Limits for tasks assessed                      Exercises      General Comments        Pertinent Vitals/Pain Pain Assessment: No/denies pain    Home Living                      Prior Function            PT Goals (current goals can now be found in the care plan section) Progress towards PT goals: Progressing toward goals    Frequency    Min 3X/week      PT Plan Current plan remains appropriate    Co-evaluation             End of Session Equipment Utilized During Treatment: Gait belt;Oxygen Activity Tolerance: Patient tolerated treatment well Patient left: in bed;with call bell/phone within reach;with bed alarm set     Time: 2130-8657 PT Time Calculation (min) (ACUTE ONLY): 30 min  Charges:  $Gait Training: 8-22 mins $Therapeutic Activity: 8-22 mins                    G Codes:  Karalee HeightRachel Gudelia Eugene 12/19/2015, 4:41 PM Karalee Heightachel Camren Lipsett, SPT

## 2015-12-19 NOTE — Progress Notes (Signed)
Pt insisted on getting up from chair and walking to bed. This RT assisted her as she has on yellow socks and is labeled a "fall risk" on her board. Upon pt returning to bed, this RT placed her  back on and asked if there was anything else she needed. Pt stated she was waiting for this RT to leave so she could get up and go to the bathroom. This RT reiterated that pt is a fall risk and is not to get up by herself. Pt stated she was going to do it anyways because with the lasix there is never anyone to help her. NT made aware and she went to speak with Pt. RT will continue to monitor.

## 2015-12-19 NOTE — Progress Notes (Signed)
VASCULAR LAB PRELIMINARY  PRELIMINARY  PRELIMINARY  PRELIMINARY  Bilateral lower extremity venous duplex completed.    Preliminary report:  Bilateral:  No obvious evidence of DVT, superficial thrombosis, or Baker's Cyst. Unable to image the peroneal veins due to moderate to severe interstitial fluid from the popliteal fossa to the ankle   [name], [name], RVS 12/19/2015, 11:24 AM

## 2015-12-19 NOTE — Progress Notes (Signed)
[facility] PROGRESS NOTE    Progress Note  Gabrielle [name]  ZOX:[id number] DOB: 13-Feb-1946 DOA: 12/16/2015 PCP: [name], MD     Brief Narrative:   OLIVIANNA [name] is an 70 y.o. female past medical history of CAD status post CABG, paroxysmal atrial fibrillation, chronic systolic heart failure was referred to the ED by her PCP for shortness of breath, the patient felt 4 days prior to admission, she did not loose consciousness since the fall she's been having back pain she said her PCP on the day of admission found her to be hypoxic and send her to the ED, M.D. she was found to have bilateral wheezing and crackles chest x-ray showing possible pneumonia with vascular congestion.  Assessment/Plan:   Acute respiratory failure with hypoxia (HCC): Probably due to acute decompensated diastolic heart failure. 2-D echo lvef wnl, does has diastolic dysfunction, cardiac biomarkers have been negative 2. DC IV antibiotics and continue IV Lasix, follow strict is and os, daily weights. Still has crackles and lower extremity edema, continue diuresis, wean oxygen   Atrial fibrillation with controlled ventricular response (HCC): chads 2 vasc score is more than 2 Not on anticoagulation due to GI bleed. Cardiology consulted regarding sotalol in the setting of prolonged QT who recommended d/c sotalol  Prolonged QT: QTC on presentation D/c celexa,  Keep K.4, mag >2, Cardiology consulted regarding sotalol use in the setting of prolonged QT who recommended d/c sotalol Daily ekg to monitor Qtc  HTN; continue cozaar   Thrombocytopenia: Unclear etiology, seems slowing improving  Normocytic anemia: Does has h/o recurrent gi bleed, no active bleed during this hospitalization anemia panel unremarkable  Right ear pain, right mastoid process tenderness Otoscope exam reveal opaque tympanic member , no fluids level, no perforation ct maxillofacial no acute finginds, started on ear  drops, flonase, doxycycline ( not a candidate for quinolones due to QTc, not a candidate for augmentin due to she is allergic to pcn)  Falls/FTT/poor memory: Not sure question near syncopal has anything to do with the prolonged QTC of 600. PT recommended SNF , patient declines, family requested assisted living placement, social worker consulted  Prior smoker, quit smoking several months ago, h/o copd, no significant wheezing, does has rhonchi and crackles, continue nebs, add mucinex  Ct chest: Nodular interstitial opacities at the lung bases. This may reflect scarring, atypical infection or mild interstitial lung disease.   DVT prophylaxis: lovenox Family Communication: twin sister at bedside Disposition Plan/Barrier to D/C: PT recommended SNf, patient declined, family want to try assisted living, social worker consulted Code Status:     Code Status Orders        Start     Ordered   12/16/15 2148  Full code  Continuous     12/16/15 2148    Code Status History    Date Active Date Inactive Code Status Order ID Comments User Context   10/27/2015  4:16 PM 10/30/2015  6:03 PM Full Code [id number]  Meredith Pel, NP ED   10/21/2015  6:50 PM 10/25/2015  8:23 PM Full Code [id number]  Jeralyn Bennett, MD Inpatient        IV Access:    Peripheral IV   Procedures and diagnostic studies:   Ct Chest Wo Contrast  Result Date: 12/19/2015 CLINICAL DATA:  Acute onset of respiratory failure. Hypoxia. Atrial fibrillation. Initial encounter. EXAM: CT CHEST WITHOUT CONTRAST TECHNIQUE: Multidetector CT imaging of the chest was performed following the standard protocol without IV contrast. COMPARISON:  Chest radiograph performed 12/16/2015 FINDINGS: Cardiovascular: The heart is mildly enlarged. Diffuse coronary artery calcifications are seen. Calcification is noted at the mitral valve. Scattered calcification is noted along the thoracic aorta. The great vessels are grossly unremarkable.  Mediastinum/Nodes: The mediastinum is otherwise unremarkable. No mediastinal lymphadenopathy is seen. No pericardial effusion is identified. The thyroid gland is grossly unremarkable. No axillary lymphadenopathy is seen. The patient is status post median sternotomy. Lungs/Pleura: Nodular interstitial opacities are noted at the lung bases. This may reflect scarring, atypical infection, or mild interstitial lung disease. No pleural effusion or pneumothorax is seen. Upper Abdomen: The visualized portions of the liver and spleen are grossly unremarkable. The visualized portions of the pancreas and adrenal glands are grossly unremarkable. There is mild left renal atrophy. Nonobstructing bilateral renal stones are seen, measuring up to 3 mm in size. Musculoskeletal: No acute osseous abnormalities are identified. The visualized musculature is unremarkable in appearance. IMPRESSION: 1. Nodular interstitial opacities at the lung bases. This may reflect scarring, atypical infection or mild interstitial lung disease. 2. Mild cardiomegaly. Diffuse coronary artery calcifications seen. Calcification at the mitral valve. 3. Mild left renal atrophy. Nonobstructing bilateral renal stones, measuring up to 3 mm in size. Electronically Signed   By: Roanna Raider M.D.   On: 12/19/2015 00:49   Ct Maxillofacial Wo Contrast  Result Date: 12/19/2015 CLINICAL DATA:  70 year old female with recent fall. EXAM: CT MAXILLOFACIAL WITHOUT CONTRAST TECHNIQUE: Multidetector CT imaging of the maxillofacial structures was performed. Multiplanar CT image reconstructions were also generated. A small metallic BB was placed on the right temple in order to reliably differentiate right from left. COMPARISON:  None. FINDINGS: Osseous: No acute fracture or dislocation. Chronic degenerative changes of the temporomandibular joints. The bones are osteopenic. Orbits: Negative. No traumatic or inflammatory finding. Sinuses: Clear. Soft tissues: Negative.  Limited intracranial: No significant or unexpected finding. IMPRESSION: No acute facial bone fractures. Electronically Signed   By: Elgie Collard M.D.   On: 12/19/2015 00:58     Medical Consultants:    cardiology  Anti-Infectives:   IV Rocephin and start on 12/17/2015 Doxycycline started on 12/16/2015.  Subjective:    [name] she relates she continues to be short of breath.  Objective:    Vitals:   12/19/15 0605 12/19/15 0919 12/19/15 1300 12/19/15 1441  BP: (!) 149/65  136/66   Pulse: (!) 58  62   Resp: 20  20   Temp: 98.7 F (37.1 C)  98.2 F (36.8 C)   TempSrc: Oral  Oral   SpO2: 95% 91% 96% 94%  Weight: 79.8 kg (176 lb)     Height:        Intake/Output Summary (Last 24 hours) at 12/19/15 1621 Last data filed at 12/19/15 0940  Gross per 24 hour  Intake              240 ml  Output             3150 ml  Net            -2910 ml   Filed Weights   12/17/15 0533 12/18/15 0429 12/19/15 0605  Weight: 86.7 kg (191 lb 1.6 oz) 83 kg (183 lb) 79.8 kg (176 lb)    Exam: General exam: In no acute distress. Respiratory system:  Crackles on the right lung base. Cardiovascular system: S1 & S2 heard, RRR. Positive JVD Gastrointestinal system: Abdomen is nondistended, soft and nontender.  Central nervous system: Alert and oriented. No focal  neurological deficits. Extremities: + lower extremity pitting edema. Skin: No rashes, lesions or ulcers Psychiatry: Judgement and insight appear normal. Mood & affect appropriate.    Data Reviewed:    Labs: Basic Metabolic Panel:  Recent Labs Lab 12/16/15 1545 12/16/15 2201 12/17/15 0345 12/18/15 0501 12/19/15 0525  NA 140  --  140 143 142  K 3.1*  --  3.6 3.7 4.3  CL 102  --  102 103 101  CO2 32  --  32 33* 33*  GLUCOSE 76  --  159* 106* 101*  BUN 10  --  11 10 11   CREATININE 1.01*  --  0.94 0.95 0.89  CALCIUM 8.7*  --  8.4* 8.8* 9.0  MG  --  1.5*  --  1.6* 1.9   GFR Estimated Creatinine Clearance:  55.9 mL/min (by C-G formula based on SCr of 0.89 mg/dL). Liver Function Tests:  Recent Labs Lab 12/19/15 0525  AST 33  ALT 9*  ALKPHOS 83  BILITOT 2.7*  PROT 5.9*  ALBUMIN 3.4*   No results for input(s): LIPASE, AMYLASE in the last 168 hours. No results for input(s): AMMONIA in the last 168 hours. Coagulation profile No results for input(s): INR, PROTIME in the last 168 hours.  CBC:  Recent Labs Lab 12/16/15 1545 12/17/15 0345 12/19/15 0525  WBC 8.7 5.2 10.7*  NEUTROABS  --   --  6.1  HGB 12.2 10.9* 11.5*  HCT 38.3 33.8* 35.9*  MCV 97.2 96.3 95.7  PLT 126* 98* 130*   Cardiac Enzymes:  Recent Labs Lab 12/16/15 2201  TROPONINI <0.03   BNP (last 3 results) No results for input(s): PROBNP in the last 8760 hours. CBG:  Recent Labs Lab 12/16/15 1544  GLUCAP 73   D-Dimer: No results for input(s): DDIMER in the last 72 hours. Hgb A1c: No results for input(s): HGBA1C in the last 72 hours. Lipid Profile: No results for input(s): CHOL, HDL, LDLCALC, TRIG, CHOLHDL, LDLDIRECT in the last 72 hours. Thyroid function studies:  Recent Labs  12/17/15 0345  TSH 1.147   Anemia work up:  Recent Labs  12/17/15 1005  VITAMINB12 1,167*  FOLATE 10.3  FERRITIN 39  TIBC 329  IRON 35  RETICCTPCT 2.6   Sepsis Labs:  Recent Labs Lab 12/16/15 1545 12/17/15 0345 12/19/15 0525  WBC 8.7 5.2 10.7*   Microbiology Recent Results (from the past 240 hour(s))  MRSA PCR Screening     Status: None   Collection Time: 12/16/15  9:18 PM  Result Value Ref Range Status   MRSA by PCR NEGATIVE NEGATIVE Final    Comment:        The GeneXpert MRSA Assay (FDA approved for NASAL specimens only), is one component of a comprehensive MRSA colonization surveillance program. It is not intended to diagnose MRSA infection nor to guide or monitor treatment for MRSA infections.   Culture, sputum-assessment     Status: None   Collection Time: 12/18/15  7:03 AM  Result Value Ref  Range Status   Specimen Description SPUTUM  Final   Special Requests NONE  Final   Sputum evaluation   Final    THIS SPECIMEN IS ACCEPTABLE. RESPIRATORY CULTURE REPORT TO FOLLOW.   Report Status 12/18/2015 FINAL  Final  Culture, respiratory (NON-Expectorated)     Status: None (Preliminary result)   Collection Time: 12/18/15  7:03 AM  Result Value Ref Range Status   Specimen Description SPUTUM  Final   Special Requests NONE  Final   Gram  Stain   Final    MODERATE WBC PRESENT,BOTH PMN AND MONONUCLEAR FEW SQUAMOUS EPITHELIAL CELLS PRESENT ABUNDANT GRAM POSITIVE COCCI IN CLUSTERS MODERATE GRAM POSITIVE COCCI IN PAIRS FEW YEAST WITH PSEUDOHYPHAE FEW GRAM NEGATIVE COCCOBACILLI RARE GRAM NEGATIVE DIPLOCOCCI    Culture   Final    CULTURE REINCUBATED FOR BETTER GROWTH [facility]    Report Status PENDING  Incomplete     Medications:   . atorvastatin  20 mg Oral QPM  . ciprofloxacin-dexamethasone  4 drop Both Ears BID  . doxycycline  100 mg Oral Q12H  . fluticasone  2 spray Each Nare Daily  . furosemide  40 mg Intravenous Q12H  . guaiFENesin  600 mg Oral BID  . heparin  5,000 Units Subcutaneous Q8H  . ipratropium-albuterol  3 mL Nebulization Q6H  . losartan  50 mg Oral Daily  . mometasone-formoterol  2 puff Inhalation BID  . pantoprazole  40 mg Oral QAC breakfast  . sodium chloride flush  3 mL Intravenous Q12H   Continuous Infusions:     LOS: 3 days   Volanda Mangine MD PhD  [facility] Pager [phone]  *Please refer to [email or web address], password [email or web address] to get updated schedule on who will round on this patient, as hospitalists switch teams weekly. If 7PM-7AM, please contact night-coverage at [email or web address], password [email or web address] for any overnight needs.  12/19/2015, 4:21 PM

## 2015-12-19 NOTE — Consult Note (Signed)
Cardiology Consult    Patient ID: Gabrielle [name] MRN: [id number], DOB/AGE: Sep 25, 1945   Admit date: 12/16/2015 Date of Consult: 12/19/2015  Primary Physician: [name], MD Reason for Consult: Prolonged QT Primary Cardiologist: Dr. Anne Fu Requesting Provider: Dr. Roda Shutters   History of Present Illness    Gabrielle [name] is a 70 y.o. female with past medical history of CAD (s/p CABG in 2010), paroxysmal atrial fibrillation (not on anticoagulation secondary to GI bleed in 10/2015), Stage 3 CKD, HLD, and bradycardia who presented to [facility] ED on 12/16/2015 for increased falls and generalized weakness.   [name] was recently seen in the office by Dr. Anne Fu on 11/12/2015 following her recent hospitalization in 10/2015. During that admission she was found to have a Hgb of 4.0 and required 6 units pRBC's throughout admission. This was thought to be secondary to a diverticular bleed. It was decided at the time of her office visit to not resume anticoagulation as this was her second hospitalization secondary to a GI bleed. Was continued on Sotalol 80mg  BID as Metoprolol had been discontinued in the past due to bradycardia. She had called back in the interim reporting bradycardia with HR in the 40's, therefore her Sotalol was decreased from 80mg  BID to 80mg  daily.   Since then, she reports worsening fatigue and weakness at home. Experienced a fall this past weekend, saying her legs "gave out". Denies any chest discomfort, palpitations, or loss of consciousness at that time. Does have occasional dizziness.   On the day of presentation, she had been seen by her PCP for generalized weakness and was found to be hypoxic. Therefore sent to the ER for further evaluation.   Initial labs showed a WBC of 8.7, Hgb 12.2, and platelets 126. K+ 3.1 and creatinine 1.01. BNP 751. Initial troponin negative. Mg 1.5. TSH 1.147. CXR with right middle lobe infiltrate and mild interstitial edema in the right lung  base. Chest CT was obtained and showed nodular interstitial opacities at the lung bases which may reflect scarring, atypical infection or mild interstitial lung disease. Initial EKG showed NSR, HR 60, with no acute ST or T-wave changes. QTC at 602 ms.  She was started on Doxycycline for PNA along with DuoNeb treatment and inhalers. Was thought to be volume overloaded and started on IV Lasix 40mg  BID. Weight is down 15 lbs (191 --> 176 lbs) with a recorded net output of -6.4L. Mg was replaced and is at 1.9 currently.   Has noted right ear pain with CT imaging unrevealing. Was restarted on Doxycycline at that time.   She reports her breathing has improved but is still not at baseline. Is urinating frequently secondary to Lasix. Is frustrated her family wishes for her to go to Assisted Living. Says she refuses to do so and wants to return home at time of discharge.   Past Medical History   Past Medical History:  Diagnosis Date  . Anemia   . Arthritis   . Asthma   . Atrial fibrillation (HCC)   . Atrial fibrillation with controlled ventricular response (HCC) 11/2009  . Chronic bronchitis   . Chronic headache   . COPD (chronic obstructive pulmonary disease) (HCC)   . Depression   . Heart attack   . Hyperlipidemia   . Hypertension     Past Surgical History:  Procedure Laterality Date  . CAD-CABG     x7, brief post-op Atrial Fib  . CESAREAN SECTION     x2  .  COLONOSCOPY WITH PROPOFOL N/A 10/24/2015   Procedure: COLONOSCOPY WITH PROPOFOL;  Surgeon: Carman Ching, MD;  Location: [facility] ENDOSCOPY;  Service: Endoscopy;  Laterality: N/A;  . CORONARY ARTERY BYPASS GRAFT  2011  . ESOPHAGOGASTRODUODENOSCOPY N/A 10/22/2015   Procedure: ESOPHAGOGASTRODUODENOSCOPY (EGD);  Surgeon: Carman Ching, MD;  Location: [facility] ENDOSCOPY;  Service: Endoscopy;  Laterality: N/A;  . JOINT REPLACEMENT Bilateral   . TONSILLECTOMY    . TOTAL KNEE ARTHROPLASTY       Allergies  Allergies  Allergen Reactions  .  Novocain [Procaine] Hives and Other (See Comments)    palpitations  . Penicillins Hives    Has patient had a PCN reaction causing immediate rash, facial/tongue/throat swelling, SOB or lightheadedness with hypotension: Yes Has patient had a PCN reaction causing severe rash involving mucus membranes or skin necrosis: No Has patient had a PCN reaction that required hospitalization No Has patient had a PCN reaction occurring within the last 10 years: No If all of the above answers are "NO", then may proceed with Cephalosporin use.    Inpatient Medications    . atorvastatin  20 mg Oral QPM  . ciprofloxacin-dexamethasone  4 drop Both Ears BID  . doxycycline  100 mg Oral Q12H  . fluticasone  2 spray Each Nare Daily  . furosemide  40 mg Intravenous Q12H  . guaiFENesin  600 mg Oral BID  . heparin  5,000 Units Subcutaneous Q8H  . ipratropium-albuterol  3 mL Nebulization Q6H  . losartan  50 mg Oral Daily  . mometasone-formoterol  2 puff Inhalation BID  . pantoprazole  40 mg Oral QAC breakfast  . sodium chloride flush  3 mL Intravenous Q12H    Family History    Family History  Problem Relation Age of Onset  . Diabetes Mother   . Hypertension Mother   . Heart disease Mother     before age 58  . Heart disease Father     before age 71  . Hypertension Father   . Heart attack Father   . Lung cancer Maternal Grandfather   . Lung cancer Paternal Grandfather   . Hypertension Daughter   . Stroke Paternal Grandmother     Social History    Social History   Social History  . Marital status: Divorced    Spouse name: N/A  . Number of children: N/A  . Years of education: N/A   Occupational History  . retired Clinical biochemist   Social History Main Topics  . Smoking status: Former Smoker    Packs/day: 0.20    Types: Cigarettes  . Smokeless tobacco: Never Used  . Alcohol use No  . Drug use: No  . Sexual activity: Not on file   Other Topics Concern  . Not on file   Social  History Narrative  . No narrative on file     Review of Systems    General:  No chills, fever, night sweats or weight changes. Positive for fatigue and generalized weakness. Cardiovascular:  No chest pain, palpitations, paroxysmal nocturnal dyspnea. Positive for dyspnea with exertion, orthopnea, and lower extremity edema.  Dermatological: No rash, lesions/masses Respiratory: No cough, dyspnea Urologic: No hematuria, dysuria Abdominal:   No nausea, vomiting, diarrhea, bright red blood per rectum, melena, or hematemesis Neurologic:  No visual changes, changes in mental status. Positive for weakness and freqeunt falls. All other systems reviewed and are otherwise negative except as noted above.  Physical Exam    Blood pressure (!) 149/65, pulse (!) 58, temperature  98.7 F (37.1 C), temperature source Oral, resp. rate 20, height 5\' 2"  (1.575 m), weight 176 lb (79.8 kg), SpO2 95 %.  General: Pleasant, overweight Caucasian female appearing in NAD Psych: Normal affect. Neuro: Alert and oriented X 3. Moves all extremities spontaneously. HEENT: Normal  Neck: Supple without bruits. JVD at 10cm. Lungs:  Resp regular and unlabored, rales at bases bilaterally, mild inspiratory wheeze in upper lung fields. Heart: RRR no s3, s4, 2/6 SEM at Apex. Abdomen: Soft, non-tender, non-distended, BS + x 4.  Extremities: No clubbing or cyanosis. Trace lower extremity edema bilaterally. DP/PT/Radials 2+ and equal bilaterally.  Labs    Troponin [facility](Point of Care Test)  Recent Labs  12/16/15 1850  TROPIPOC 0.00    Recent Labs  12/16/15 2201  TROPONINI <0.03   Lab Results  Component Value Date   WBC 10.7 (H) 12/19/2015   HGB 11.5 (L) 12/19/2015   HCT 35.9 (L) 12/19/2015   MCV 95.7 12/19/2015   PLT 130 (L) 12/19/2015    Recent Labs Lab 12/19/15 0525  NA 142  K 4.3  CL 101  CO2 33*  BUN 11  CREATININE 0.89  CALCIUM 9.0  PROT 5.9*  BILITOT 2.7*  ALKPHOS 83  ALT 9*  AST 33  GLUCOSE 101*    Lab Results  Component Value Date   CHOL  11/28/2009    112        ATP III CLASSIFICATION:  <200     mg/dL   Desirable  [id number]  mg/dL   Borderline High  >=045>=240    mg/dL   High          HDL 52 11/28/2009   LDLCALC  11/28/2009    40        Total Cholesterol/HDL:CHD Risk Coronary Heart Disease Risk Table                     Men   Women  1/2 Average Risk   3.4   3.3  Average Risk       5.0   4.4  2 X Average Risk   9.6   7.1  3 X Average Risk  23.4   11.0        Use the calculated Patient Ratio above and the CHD Risk Table to determine the patient's CHD Risk.        ATP III CLASSIFICATION (LDL):  <100     mg/dL   Optimal  [id number]  mg/dL   Near or Above                    Optimal  130-159  mg/dL   Borderline  [id number]  mg/dL   High  >956>190     mg/dL   Very High   TRIG 99 21/30/865709/29/2011   No results found for: [facility]   Radiology Studies    Dg Chest 2 View  Result Date: 12/16/2015 CLINICAL DATA:  Chest pain and shortness of Breath EXAM: CHEST  2 VIEW COMPARISON:  11/27/2009 FINDINGS: Cardiac shadow is enlarged. Postsurgical changes are again seen. The lungs are hyperinflated with some mid lung scarring on the left. Increased density in the right lung base is noted projecting in the right middle lobe on the lateral projection. Mild interstitial edema is noted particularly in the right lung base. No sizable effusion is seen. No acute bony abnormality is noted. IMPRESSION: Right middle lobe infiltrate. Mild interstitial edema is noted in the right lung base. Electronically  Signed   By: Alcide Clever M.D.   On: 12/16/2015 17:06   Dg Thoracic Spine 2 View  Result Date: 12/17/2015 CLINICAL DATA:  Back pain.  Fall. EXAM: THORACIC SPINE 2 VIEWS COMPARISON:  12/16/2015. FINDINGS: No acute bony abnormality identified. Degenerative changes thoracic spine. Stable minimal upper thoracic vertebral body compressions. Aortic atherosclerotic vascular calcification. Prior CABG. IMPRESSION: 1. Diffuse  degenerative changes thoracic spine. Old mild upper thoracic compressions noted. No acute abnormality. 2. Aortic atherosclerotic vascular disease.  Prior CABG. Electronically Signed   By: Maisie Fus  Register   On: 12/17/2015 07:14   Dg Lumbar Spine Complete  Result Date: 12/17/2015 CLINICAL DATA:  Shoulder pain.  Fall. EXAM: LUMBAR SPINE - COMPLETE 4+ VIEW COMPARISON:  Ultrasound 07/30/2015.  MRI 08/16/2013.  CT 12/01/2010. FINDINGS: Diffuse multilevel degenerative change. 4 mm anterolisthesis L4 on L5. Mild L3 vertebral body compression. This is new from prior study of 08/16/2013. Aortic atherosclerotic vascular calcification. Aneurysmal dilatation of the distal abdominal aorta 3 cm cannot be excluded. Abdominal aortic ultrasound can be obtained for further evaluation . Left nephrolithiasis again noted. IMPRESSION: 1. Mild L3 vertebral body compression. This is new from prior study of 08/16/2013. 2. Diffuse degenerative change lumbar spine with 4 mm anterolisthesis L4 on L5. 3.Aortic atherosclerotic vascular disease. Aneurysmal dilatation of the distal abdominal aorta to 3 cm cannot be excluded. Abdominal aortic ultrasound can be obtained for further evaluation. Left nephrolithiasis again noted. 4. Left nephrolithiasis again noted. Electronically Signed   By: Maisie Fus  Register   On: 12/17/2015 07:21   Dg Pelvis 1-2 Views  Result Date: 12/17/2015 CLINICAL DATA:  Back pain.  Fall. EXAM: PELVIS - 1-2 VIEW COMPARISON:  CT 12/01/2010. FINDINGS: Degenerative changes lumbar spine and both hips. No acute bony abnormality. Vascular calcification. IMPRESSION: Degenerative changes lumbar spine and both hips. No acute bony abnormality. Electronically Signed   By: Maisie Fus  Register   On: 12/17/2015 07:19   Ct Chest Wo Contrast  Result Date: 12/19/2015 CLINICAL DATA:  Acute onset of respiratory failure. Hypoxia. Atrial fibrillation. Initial encounter. EXAM: CT CHEST WITHOUT CONTRAST TECHNIQUE: Multidetector CT imaging  of the chest was performed following the standard protocol without IV contrast. COMPARISON:  Chest radiograph performed 12/16/2015 FINDINGS: Cardiovascular: The heart is mildly enlarged. Diffuse coronary artery calcifications are seen. Calcification is noted at the mitral valve. Scattered calcification is noted along the thoracic aorta. The great vessels are grossly unremarkable. Mediastinum/Nodes: The mediastinum is otherwise unremarkable. No mediastinal lymphadenopathy is seen. No pericardial effusion is identified. The thyroid gland is grossly unremarkable. No axillary lymphadenopathy is seen. The patient is status post median sternotomy. Lungs/Pleura: Nodular interstitial opacities are noted at the lung bases. This may reflect scarring, atypical infection, or mild interstitial lung disease. No pleural effusion or pneumothorax is seen. Upper Abdomen: The visualized portions of the liver and spleen are grossly unremarkable. The visualized portions of the pancreas and adrenal glands are grossly unremarkable. There is mild left renal atrophy. Nonobstructing bilateral renal stones are seen, measuring up to 3 mm in size. Musculoskeletal: No acute osseous abnormalities are identified. The visualized musculature is unremarkable in appearance. IMPRESSION: 1. Nodular interstitial opacities at the lung bases. This may reflect scarring, atypical infection or mild interstitial lung disease. 2. Mild cardiomegaly. Diffuse coronary artery calcifications seen. Calcification at the mitral valve. 3. Mild left renal atrophy. Nonobstructing bilateral renal stones, measuring up to 3 mm in size. Electronically Signed   By: Roanna Raider M.D.   On: 12/19/2015 00:49   Dg  Shoulder Left  Result Date: 12/16/2015 CLINICAL DATA:  Left shoulder pain following fall 2 days ago, initial encounter EXAM: LEFT SHOULDER - 2+ VIEW COMPARISON:  None. FINDINGS: There is no evidence of fracture or dislocation. There is no evidence of arthropathy  or other focal bone abnormality. Soft tissues are unremarkable. Old healed rib fractures are noted. IMPRESSION: No acute abnormality noted. Electronically Signed   By: Alcide Clever M.D.   On: 12/16/2015 17:06   Ct Maxillofacial Wo Contrast  Result Date: 12/19/2015 CLINICAL DATA:  70 year old female with recent fall. EXAM: CT MAXILLOFACIAL WITHOUT CONTRAST TECHNIQUE: Multidetector CT imaging of the maxillofacial structures was performed. Multiplanar CT image reconstructions were also generated. A small metallic BB was placed on the right temple in order to reliably differentiate right from left. COMPARISON:  None. FINDINGS: Osseous: No acute fracture or dislocation. Chronic degenerative changes of the temporomandibular joints. The bones are osteopenic. Orbits: Negative. No traumatic or inflammatory finding. Sinuses: Clear. Soft tissues: Negative. Limited intracranial: No significant or unexpected finding. IMPRESSION: No acute facial bone fractures. Electronically Signed   By: Elgie Collard M.D.   On: 12/19/2015 00:58    EKG & Cardiac Imaging    EKG:  NSR, HR 60, with no acute ST or T-wave changes. QTC at 602 ms.  Echocardiogram: 12/17/2015 Study Conclusions  - Left ventricle: The cavity size was moderately dilated. Systolic   function was normal. The estimated ejection fraction was in the   range of 55% to 60%. Wall motion was normal; there were no   regional wall motion abnormalities. There was a reduced   contribution of atrial contraction to ventricular filling, due to   increased ventricular diastolic pressure or atrial contractile   dysfunction. Doppler parameters are consistent with a reversible   restrictive pattern, indicative of decreased left ventricular   diastolic compliance and/or increased left atrial pressure (grade   3 diastolic dysfunction). Doppler parameters are consistent with   high ventricular filling pressure. - Mitral valve: Calcified annulus. The findings are  consistent with   mild stenosis. There was mild to moderate regurgitation directed   eccentrically and posteriorly. Mean gradient (D): 3 mm Hg. Valve   area by pressure half-time: 1.42 cm^2. Valve area by continuity   equation (using LVOT flow): 1.6 cm^2. - Left atrium: The atrium was severely dilated. - Pulmonic valve: There was trivial regurgitation. - Pulmonary arteries: PA peak pressure: 57 mm Hg (S).  Impressions:  - The right ventricular systolic pressure was increased consistent   with moderate pulmonary hypertension.  Assessment & Plan    1. Prolonged QT - EKG on admission showed NSR, HR 60, with no acute ST or T-wave changes. QTC at 602 ms.  - was on Sotalol 80mg  daily prior to admission. This is currently being held. Celexa previously discontinued by admitting team.  - Initial K+ 3.1 and Mg 1.5. K+ at 4.3 today and Mg at 1.9. Will give additional 2g today to keep Mg > 2.0.  - the fall she describes over the weekend sounds mechanical in nature. No prodromal symptoms or loss of consciousness. Agree she needs to work with PT and would benefit from [facility] PT, but she is reluctant to this. - repeat EKG today.   2. Acute on Chronic Diastolic CHF - Echocardiogram this admission shows preserved EF of 55-60% with grade 3 diastolic dysfunction. BNP elevated to 751 on admission.  - started on IV Lasix 40mg  BID at time of admission. Weight is down 15  lbs (191 --> 176 lbs) with a recorded net output of -6.4L. Continue with IV diuresis as she still appears volume overloaded on physical exam.  - obtain daily weights and strict I&O's. Reviewed importance of sodium and fluid restriction with the patient. She had been consuming lots of soup prior to admission.   3. Paroxysmal Atrial Fibrillation - Maintaining normal sinus rhythm this admission. Sotalol held as above. Would avoid beta blocker use with recent history of bradycardia. - This patients CHA2DS2-VASc Score and unadjusted  Ischemic Stroke Rate (% per year) is equal to 7.2 % stroke rate/year from a score of 5 (CHF, HTN, Vascular, Age, Female). Not on anticoagulation due to history of recent GI bleeds. Hemoglobin stable at 11.5 today.  4. Coronary Artery Disease - status post CABG in 2010. Denies any recent anginal symptoms. - EKG is without acute ischemic changes.  5. HTN - continue Losartan  6. Stage 3 CKD - Creatinine stable at 0.89. Continue to monitor with daily BMET in the setting of IV diuresis.  Signed, Ellsworth Lennox, PA-C 12/19/2015, 9:05 AM Pager: [phone]  Personally seen and examined. Agree with above.  QT prolonged  - stopped sotalol.   - previously had decreased it to once a day given bradycardia.   - Celexa held as well  - Repleting Mag  - repeating ECG  - Doubt had anything to do with her fall (mechanical) Feels OK. No CP. RRR.   Acute on chronic diastolic HF  - IV lasix  - good weight loss  - continue   Will follow  Donato Schultz, MD

## 2015-12-20 DIAGNOSIS — Z7901 Long term (current) use of anticoagulants: Secondary | ICD-10-CM

## 2015-12-20 DIAGNOSIS — I509 Heart failure, unspecified: Secondary | ICD-10-CM

## 2015-12-20 LAB — BASIC METABOLIC PANEL
Anion gap: 5 (ref 5–15)
BUN: 14 mg/dL (ref 6–20)
CO2: 34 mmol/L — ABNORMAL HIGH (ref 22–32)
Calcium: 8.6 mg/dL — ABNORMAL LOW (ref 8.9–10.3)
Chloride: 101 mmol/L (ref 101–111)
Creatinine, Ser: 1.05 mg/dL — ABNORMAL HIGH (ref 0.44–1.00)
GFR calc Af Amer: 60 mL/min — ABNORMAL LOW (ref >60)
GFR calc non Af Amer: 52 mL/min — ABNORMAL LOW (ref >60)
Glucose, Bld: 117 mg/dL — ABNORMAL HIGH (ref 65–99)
Potassium: 4.4 mmol/L (ref 3.5–5.1)
Sodium: 140 mmol/L (ref 135–145)

## 2015-12-20 LAB — CULTURE, RESPIRATORY: Culture: NORMAL

## 2015-12-20 LAB — CULTURE, RESPIRATORY W GRAM STAIN

## 2015-12-20 LAB — MAGNESIUM: Magnesium: 2.2 mg/dL (ref 1.7–2.4)

## 2015-12-20 MED ORDER — LOSARTAN POTASSIUM 25 MG PO TABS
25.0000 mg | ORAL_TABLET | Freq: Every day | ORAL | Status: DC
Start: 2015-12-20 — End: 2015-12-28
  Administered 2015-12-21 – 2015-12-28 (×7): 25 mg via ORAL
  Filled 2015-12-20 (×8): qty 1

## 2015-12-20 MED ORDER — SENNOSIDES-DOCUSATE SODIUM 8.6-50 MG PO TABS
1.0000 | ORAL_TABLET | Freq: Two times a day (BID) | ORAL | Status: DC
  Administered 2015-12-20 – 2015-12-24 (×7): 1 via ORAL
  Filled 2015-12-20 (×14): qty 1

## 2015-12-20 MED ORDER — FLUTICASONE PROPIONATE 50 MCG/ACT NA SUSP: 2.0000 | Freq: Every day | NASAL | 0 refills | Status: AC

## 2015-12-20 MED ORDER — POLYETHYLENE GLYCOL 3350 17 G PO PACK
17.0000 g | PACK | Freq: Every day | ORAL | Status: DC
  Administered 2015-12-20: 17 g via ORAL
  Filled 2015-12-20 (×9): qty 1

## 2015-12-20 MED ORDER — GUAIFENESIN ER 600 MG PO TB12: 600.0000 mg | ORAL_TABLET | Freq: Two times a day (BID) | ORAL | 0 refills | Status: DC

## 2015-12-20 MED ORDER — APIXABAN 5 MG PO TABS
5.0000 mg | ORAL_TABLET | Freq: Two times a day (BID) | ORAL | Status: DC
  Administered 2015-12-20 – 2015-12-22 (×6): 5 mg via ORAL
  Filled 2015-12-20 (×7): qty 1

## 2015-12-20 MED ORDER — FUROSEMIDE 20 MG PO TABS
20.0000 mg | ORAL_TABLET | Freq: Every day | ORAL | Status: DC
  Administered 2015-12-20 – 2015-12-26 (×6): 20 mg via ORAL
  Filled 2015-12-20 (×8): qty 1

## 2015-12-20 NOTE — Progress Notes (Signed)
[facility] PROGRESS NOTE    Progress Note  Gabrielle [name]  WUJ:[id number] DOB: 12-24-1945 DOA: 12/16/2015 PCP: Cain SaupeFULP, CAMMIE, MD     Brief Narrative:   Gabrielle [name] is an 70 y.o. female past medical history of CAD status post CABG, paroxysmal atrial fibrillation, chronic systolic heart failure was referred to the ED by her PCP for shortness of breath, the patient felt 4 days prior to admission, she did not loose consciousness since the fall she's been having back pain she said her PCP on the day of admission found her to be hypoxic and send her to the ED, M.D. she was found to have bilateral wheezing and crackles chest x-ray showing possible pneumonia with vascular congestion.  Assessment/Plan:   Acute respiratory failure with hypoxia (HCC): Probably due to acute decompensated diastolic heart failure. 2-D echo lvef wnl, does has diastolic dysfunction, cardiac biomarkers have been negative 2. DC IV antibiotics and continue IV Lasix, follow strict is and os, daily weights. Still has crackles and lower extremity edema, lasix changed to oral per cardiology recommendation, wean oxygen Ambulate, may need to arrange home o2   paroxysmal Atrial fibrillation with controlled ventricular response [facility](HCC): sinus rhythm in the hospital chads 2 vasc score is more than 2 Not on anticoagulation due to GI bleed. Cardiology recommends to restart eliquis Cardiology consulted regarding sotalol in the setting of prolonged QT who recommended d/c sotalol  Prolonged QT: QTC on presentation 602ms D/c celexa,  Keep K.4, mag >2, Cardiology consulted regarding sotalol use in the setting of prolonged QT who recommended d/c sotalol Daily ekg to monitor Qtc, improving  HTN;  bp low normal ion 10/20, reduce cozaar dose, continue monitor   Thrombocytopenia: Unclear etiology, seems slowing improving  Normocytic anemia: Does has h/o recurrent gi bleed, no active bleed during this  hospitalization anemia panel unremarkable  Right ear pain, right mastoid process tenderness Otoscope exam reveal opaque tympanic member , no fluids level, no perforation ct maxillofacial no acute finginds, started on ear drops, flonase, doxycycline ( not a candidate for quinolones due to QTc, not a candidate for augmentin due to she is allergic to pcn)  Falls/FTT/poor memory: Not sure question near syncopal has anything to do with the prolonged QTC of 600. PT recommended SNF , patient declines, family requested assisted living placement, social worker consulted  Prior smoker, quit smoking several months ago, h/o copd, no significant wheezing, does has rhonchi and crackles, continue nebs, add mucinex  Ct chest: Nodular interstitial opacities at the lung bases. This may reflect scarring, atypical infection or mild interstitial lung disease.   DVT prophylaxis: lovenox Family Communication: twin sister at bedside Disposition Plan/Barrier to D/C: PT recommended SNf, patient declined, family want to try assisted living, social worker consulted, now family want patient to be discharged home with home health, will need to maximize home health Code Status:     Code Status Orders        Start     Ordered   12/16/15 2148  Full code  Continuous     12/16/15 2148    Code Status History    Date Active Date Inactive Code Status Order ID Comments User Context   10/27/2015  4:16 PM 10/30/2015  6:03 PM Full Code [id number]  Meredith PelPaula M Guenther, NP ED   10/21/2015  6:50 PM 10/25/2015  8:23 PM Full Code [id number]  Jeralyn BennettEzequiel Zamora, MD Inpatient        IV Access:    Peripheral IV  Procedures and diagnostic studies:   Ct Chest Wo Contrast  Result Date: 12/19/2015 CLINICAL DATA:  Acute onset of respiratory failure. Hypoxia. Atrial fibrillation. Initial encounter. EXAM: CT CHEST WITHOUT CONTRAST TECHNIQUE: Multidetector CT imaging of the chest was performed following the standard protocol without  IV contrast. COMPARISON:  Chest radiograph performed 12/16/2015 FINDINGS: Cardiovascular: The heart is mildly enlarged. Diffuse coronary artery calcifications are seen. Calcification is noted at the mitral valve. Scattered calcification is noted along the thoracic aorta. The great vessels are grossly unremarkable. Mediastinum/Nodes: The mediastinum is otherwise unremarkable. No mediastinal lymphadenopathy is seen. No pericardial effusion is identified. The thyroid gland is grossly unremarkable. No axillary lymphadenopathy is seen. The patient is status post median sternotomy. Lungs/Pleura: Nodular interstitial opacities are noted at the lung bases. This may reflect scarring, atypical infection, or mild interstitial lung disease. No pleural effusion or pneumothorax is seen. Upper Abdomen: The visualized portions of the liver and spleen are grossly unremarkable. The visualized portions of the pancreas and adrenal glands are grossly unremarkable. There is mild left renal atrophy. Nonobstructing bilateral renal stones are seen, measuring up to 3 mm in size. Musculoskeletal: No acute osseous abnormalities are identified. The visualized musculature is unremarkable in appearance. IMPRESSION: 1. Nodular interstitial opacities at the lung bases. This may reflect scarring, atypical infection or mild interstitial lung disease. 2. Mild cardiomegaly. Diffuse coronary artery calcifications seen. Calcification at the mitral valve. 3. Mild left renal atrophy. Nonobstructing bilateral renal stones, measuring up to 3 mm in size. Electronically Signed   By: Roanna Raider M.D.   On: 12/19/2015 00:49   Ct Maxillofacial Wo Contrast  Result Date: 12/19/2015 CLINICAL DATA:  70 year old female with recent fall. EXAM: CT MAXILLOFACIAL WITHOUT CONTRAST TECHNIQUE: Multidetector CT imaging of the maxillofacial structures was performed. Multiplanar CT image reconstructions were also generated. A small metallic BB was placed on the right  temple in order to reliably differentiate right from left. COMPARISON:  None. FINDINGS: Osseous: No acute fracture or dislocation. Chronic degenerative changes of the temporomandibular joints. The bones are osteopenic. Orbits: Negative. No traumatic or inflammatory finding. Sinuses: Clear. Soft tissues: Negative. Limited intracranial: No significant or unexpected finding. IMPRESSION: No acute facial bone fractures. Electronically Signed   By: Elgie Collard M.D.   On: 12/19/2015 00:58     Medical Consultants:    cardiology  Anti-Infectives:   IV Rocephin and start on 12/17/2015 Doxycycline started on 12/16/2015.  Subjective:    Gabrielle Palau she report breathing better, but still has congested cough, she think it is from her sinus, she started has ear problems and nasal drainage a few weeks ago  Objective:    Vitals:   12/19/15 1441 12/19/15 1933 12/19/15 2100 12/20/15 0646  BP:   (!) 149/55 (!) 91/54  Pulse:   65 (!) 45  Resp:   19 16  Temp:   97.9 F (36.6 C) 97.8 F (36.6 C)  TempSrc:   Oral Oral  SpO2: 94% 96% 93% 97%  Weight:    85.2 kg (187 lb 13.3 oz)  Height:        Intake/Output Summary (Last 24 hours) at 12/20/15 0743 Last data filed at 12/20/15 0650  Gross per 24 hour  Intake              360 ml  Output              600 ml  Net             -240  ml   Filed Weights   12/18/15 0429 12/19/15 0605 12/20/15 0646  Weight: 83 kg (183 lb) 79.8 kg (176 lb) 85.2 kg (187 lb 13.3 oz)    Exam: General exam: In no acute distress. Respiratory system:  Crackles on the right lung base. Cardiovascular system: S1 & S2 heard, RRR. Positive JVD Gastrointestinal system: Abdomen is nondistended, soft and nontender.  Central nervous system: Alert and oriented. No focal neurological deficits. Extremities: + lower extremity pitting edema slowing improving Skin: No rashes, lesions or ulcers Psychiatry: Judgement and insight appear normal. Mood & affect appropriate.     Data Reviewed:    Labs: Basic Metabolic Panel:  Recent Labs Lab 12/16/15 1545 12/16/15 2201 12/17/15 0345 12/18/15 0501 12/19/15 0525 12/20/15 0411  NA 140  --  140 143 142 140  K 3.1*  --  3.6 3.7 4.3 4.4  CL 102  --  102 103 101 101  CO2 32  --  32 33* 33* 34*  GLUCOSE 76  --  159* 106* 101* 117*  BUN 10  --  11 10 11 14   CREATININE 1.01*  --  0.94 0.95 0.89 1.05*  CALCIUM 8.7*  --  8.4* 8.8* 9.0 8.6*  MG  --  1.5*  --  1.6* 1.9 2.2   GFR Estimated Creatinine Clearance: 49 mL/min (by C-G formula based on SCr of 1.05 mg/dL (H)). Liver Function Tests:  Recent Labs Lab 12/19/15 0525  AST 33  ALT 9*  ALKPHOS 83  BILITOT 2.7*  PROT 5.9*  ALBUMIN 3.4*   No results for input(s): LIPASE, AMYLASE in the last 168 hours. No results for input(s): AMMONIA in the last 168 hours. Coagulation profile No results for input(s): INR, PROTIME in the last 168 hours.  CBC:  Recent Labs Lab 12/16/15 1545 12/17/15 0345 12/19/15 0525  WBC 8.7 5.2 10.7*  NEUTROABS  --   --  6.1  HGB 12.2 10.9* 11.5*  HCT 38.3 33.8* 35.9*  MCV 97.2 96.3 95.7  PLT 126* 98* 130*   Cardiac Enzymes:  Recent Labs Lab 12/16/15 2201  TROPONINI <0.03   BNP (last 3 results) No results for input(s): PROBNP in the last 8760 hours. CBG:  Recent Labs Lab 12/16/15 1544 12/19/15 1707  GLUCAP 73 125*   D-Dimer: No results for input(s): DDIMER in the last 72 hours. Hgb A1c: No results for input(s): HGBA1C in the last 72 hours. Lipid Profile: No results for input(s): CHOL, HDL, LDLCALC, TRIG, CHOLHDL, LDLDIRECT in the last 72 hours. Thyroid function studies: No results for input(s): TSH, T4TOTAL, T3FREE, THYROIDAB in the last 72 hours.  Invalid input(s): FREET3 Anemia work up:  Recent Labs  12/17/15 1005  VITAMINB12 1,167*  FOLATE 10.3  FERRITIN 39  TIBC 329  IRON 35  RETICCTPCT 2.6   Sepsis Labs:  Recent Labs Lab 12/16/15 1545 12/17/15 0345 12/19/15 0525  WBC 8.7 5.2  10.7*   Microbiology Recent Results (from the past 240 hour(s))  MRSA PCR Screening     Status: None   Collection Time: 12/16/15  9:18 PM  Result Value Ref Range Status   MRSA by PCR NEGATIVE NEGATIVE Final    Comment:        The GeneXpert MRSA Assay (FDA approved for NASAL specimens only), is one component of a comprehensive MRSA colonization surveillance program. It is not intended to diagnose MRSA infection nor to guide or monitor treatment for MRSA infections.   Culture, sputum-assessment     Status: None  Collection Time: 12/18/15  7:03 AM  Result Value Ref Range Status   Specimen Description SPUTUM  Final   Special Requests NONE  Final   Sputum evaluation   Final    THIS SPECIMEN IS ACCEPTABLE. RESPIRATORY CULTURE REPORT TO FOLLOW.   Report Status 12/18/2015 FINAL  Final  Culture, respiratory (NON-Expectorated)     Status: None (Preliminary result)   Collection Time: 12/18/15  7:03 AM  Result Value Ref Range Status   Specimen Description SPUTUM  Final   Special Requests NONE  Final   Gram Stain   Final    MODERATE WBC PRESENT,BOTH PMN AND MONONUCLEAR FEW SQUAMOUS EPITHELIAL CELLS PRESENT ABUNDANT GRAM POSITIVE COCCI IN CLUSTERS MODERATE GRAM POSITIVE COCCI IN PAIRS FEW YEAST WITH PSEUDOHYPHAE FEW GRAM NEGATIVE COCCOBACILLI RARE GRAM NEGATIVE DIPLOCOCCI    Culture   Final    CULTURE REINCUBATED FOR BETTER GROWTH Performed at [facility]    Report Status PENDING  Incomplete     Medications:   . atorvastatin  20 mg Oral QPM  . ciprofloxacin-dexamethasone  4 drop Both Ears BID  . doxycycline  100 mg Oral Q12H  . fluticasone  2 spray Each Nare Daily  . furosemide  40 mg Intravenous Q12H  . guaiFENesin  600 mg Oral BID  . heparin  5,000 Units Subcutaneous Q8H  . ipratropium-albuterol  3 mL Nebulization TID  . losartan  25 mg Oral Daily  . mometasone-formoterol  2 puff Inhalation BID  . pantoprazole  40 mg Oral QAC breakfast  . senna-docusate  1  tablet Oral QHS  . sodium chloride flush  3 mL Intravenous Q12H   Continuous Infusions:     LOS: 4 days   Tangelia Sanson MD PhD  [facility] Pager [phone]  *Please refer to [email or web address], password [email or web address] to get updated schedule on who will round on this patient, as hospitalists switch teams weekly. If 7PM-7AM, please contact night-coverage at [email or web address], password [email or web address] for any overnight needs.  12/20/2015, 7:43 AM

## 2015-12-20 NOTE — Progress Notes (Signed)
Physical Therapy Treatment Patient Details Name: Gabrielle PalauVirginia W [name] MRN: [id number] DOB: 1945/09/29 Today's Date: 12/20/2015    History of Present Illness Pt is a 70 y.o. female with PMH of CAD status post CABG, paroxysmal atrial fibrillation, chronic systolic heart failure, COPD, hypertension, hyperlipidemia admitted for acute respiratory failure with hypoxia.    PT Comments    Pt ambulated in hallway and was assisted back to bed. SpO2 monitored during all mobility, initially on room air sitting at EOB SpO2 88-90%. Pt began ambulating on room air at SpO2 90%, talking during gait SpO2 decreased to 78%, cued to maintain breathing and SpO2 increased to 91%. SpO2 then dropped to 82% on room air, 2L O2 applied, increased SpO2 90% on 2L. Remained at 90% on 2L for remainder of mobility. Pt reported feeling slightly weak but no significant SOB. Pt now reports being agreeable to home O2 with plans to wean. Pt's main concern is stairs, has 8 steps to ascend/descend to let dog out on daily basis. Changing recommendation to HHPT due to increase in mobility and supervision assistance level.  SATURATION QUALIFICATIONS: (This note is used to comply with regulatory documentation for home oxygen)  Patient Saturations on Room Air at Rest = 88%  Patient Saturations on Room Air while Ambulating = 78%  Patient Saturations on 2 Liters of oxygen while Ambulating = 90%  Please briefly explain why patient needs home oxygen: to improve oxygen saturation during physical activity such as ambulation    Follow Up Recommendations  Home health PT;Supervision for mobility/OOB     Equipment Recommendations  None recommended by PT    Recommendations for Other Services       Precautions / Restrictions Precautions Precautions: Fall;Back Precaution Comments: log rolling for safety secondary to acute L3 and hx of thoracic vertebral compression fxs Restrictions Weight Bearing Restrictions: No    Mobility  Bed  Mobility Overal bed mobility: Needs Assistance Bed Mobility: Rolling;Sidelying to Sit;Sit to Sidelying Rolling: Modified independent (Device/Increase time) Sidelying to sit: Modified independent (Device/Increase time)     Sit to sidelying: Modified independent (Device/Increase time) General bed mobility comments: increased time required to perform bed mobility safely and with correct technique  Transfers Overall transfer level: Needs assistance Equipment used: Rolling walker (2 wheeled) Transfers: Sit to/from Stand Sit to Stand: Supervision         General transfer comment: supervision for safety; no verbal cues required for safety or technique  Ambulation/Gait Ambulation/Gait assistance: Supervision Ambulation Distance (Feet): 400 Feet Assistive device: Rolling walker (2 wheeled) Gait Pattern/deviations: Step-through pattern;Decreased stride length     General Gait Details: supervision for safety; increased gait distance, pt reports having more energy today despite weakness from hospitalization; SpO2 monitored during gait (details in subjective)   Stairs            Wheelchair Mobility    Modified Rankin (Stroke Patients Only)       Balance                                    Cognition Arousal/Alertness: Awake/alert Behavior During Therapy: WFL for tasks assessed/performed Overall Cognitive Status: Within Functional Limits for tasks assessed                      Exercises      General Comments        Pertinent Vitals/Pain Pain Assessment: No/denies pain  Home Living                      Prior Function            PT Goals (current goals can now be found in the care plan section) Progress towards PT goals: Progressing toward goals    Frequency    Min 3X/week      PT Plan Discharge plan needs to be updated    Co-evaluation             End of Session Equipment Utilized During Treatment: Gait  belt;Oxygen Activity Tolerance: Patient tolerated treatment well Patient left: in bed;with call bell/phone within reach       Time: 4098-1191 PT Time Calculation (min) (ACUTE ONLY): 17 min  Charges:  $Gait Training: 8-22 mins                    G Codes:      [name] 2016/01/10, 4:05 PM [name], SPT

## 2015-12-20 NOTE — Progress Notes (Signed)
Patient Name: Gabrielle [name] Date of Encounter: 12/20/2015  Primary Cardiologist: [facility] Problem List     Principal Problem:   Acute respiratory failure with hypoxia [facility]) Active Problems:   COPD exacerbation (HCC)   Atrial fibrillation with controlled ventricular response (HCC)   Acute congestive heart failure (HCC)   Community acquired pneumonia of right middle lobe of lung (HCC)   Fall   Prolonged Q-T interval on ECG     Subjective   No new complaints, no CP, no SOB  Inpatient Medications    Scheduled Meds: . atorvastatin  20 mg Oral QPM  . ciprofloxacin-dexamethasone  4 drop Both Ears BID  . doxycycline  100 mg Oral Q12H  . fluticasone  2 spray Each Nare Daily  . furosemide  40 mg Intravenous Q12H  . guaiFENesin  600 mg Oral BID  . heparin  5,000 Units Subcutaneous Q8H  . ipratropium-albuterol  3 mL Nebulization TID  . losartan  25 mg Oral Daily  . mometasone-formoterol  2 puff Inhalation BID  . pantoprazole  40 mg Oral QAC breakfast  . senna-docusate  1 tablet Oral QHS  . sodium chloride flush  3 mL Intravenous Q12H   Continuous Infusions:   PRN Meds: acetaminophen **OR** acetaminophen, ALPRAZolam, ipratropium-albuterol, ondansetron **OR** ondansetron (ZOFRAN) IV, polyethylene glycol, polyvinyl alcohol, tiZANidine, traMADol   Vital Signs    Vitals:   12/19/15 2100 12/20/15 0646 12/20/15 0807 12/20/15 0810  BP: (!) 149/55 (!) 91/54    Pulse: 65 (!) 45    Resp: 19 16    Temp: 97.9 F (36.6 C) 97.8 F (36.6 C)    TempSrc: Oral Oral    SpO2: 93% 97% 97% 97%  Weight:  187 lb 13.3 oz (85.2 kg)    Height:        Intake/Output Summary (Last 24 hours) at 12/20/15 0817 Last data filed at 12/20/15 0650  Gross per 24 hour  Intake              360 ml  Output              600 ml  Net             -240 ml   Filed Weights   12/18/15 0429 12/19/15 0605 12/20/15 0646  Weight: 183 lb (83 kg) 176 lb (79.8 kg) 187 lb 13.3 oz (85.2 kg)     Physical Exam    GEN: Well nourished, well developed, in no acute distress.  HEENT: Grossly normal.  Neck: Supple, no JVD, carotid bruits, or masses. Cardiac: RRR no s3, s4, 2/6 SEM at [place]. No clubbing, cyanosis, edema.  Radials/DP/PT 2+ and equal bilaterally.  Respiratory:  Respirations regular and unlabored, clear to auscultation bilaterally. GI: Soft, nontender, nondistended, BS + x 4. MS: no deformity or atrophy. Skin: warm and dry, no rash. Neuro:  Strength and sensation are intact. Psych: AAOx3.  Normal affect.  Labs    CBC  Recent Labs  12/19/15 0525  WBC 10.7*  NEUTROABS 6.1  HGB 11.5*  HCT 35.9*  MCV 95.7  PLT 130*   Basic Metabolic Panel  Recent Labs  12/19/15 0525 12/20/15 0411  NA 142 140  K 4.3 4.4  CL 101 101  CO2 33* 34*  GLUCOSE 101* 117*  BUN 11 14  CREATININE 0.89 1.05*  CALCIUM 9.0 8.6*  MG 1.9 2.2   Liver Function Tests  Recent Labs  12/19/15 0525  AST 33  ALT 9*  ALKPHOS 83  BILITOT 2.7*  PROT 5.9*  ALBUMIN 3.4*   No results for input(s): LIPASE, AMYLASE in the last 72 hours. Cardiac Enzymes No results for input(s): CKTOTAL, CKMB, CKMBINDEX, TROPONINI in the last 72 hours. BNP Invalid input(s): POCBNP D-Dimer No results for input(s): DDIMER in the last 72 hours. Hemoglobin A1C No results for input(s): HGBA1C in the last 72 hours. Fasting Lipid Panel No results for input(s): CHOL, HDL, LDLCALC, TRIG, CHOLHDL, LDLDIRECT in the last 72 hours. Thyroid Function Tests No results for input(s): TSH, T4TOTAL, T3FREE, THYROIDAB in the last 72 hours.  Invalid input(s): FREET3  Telemetry    SB 40-50, no pauses, no afib - Personally Reviewed  ECG    As below - Personally Reviewed  Radiology    Ct Chest Wo Contrast  Result Date: 12/19/2015 CLINICAL DATA:  Acute onset of respiratory failure. Hypoxia. Atrial fibrillation. Initial encounter. EXAM: CT CHEST WITHOUT CONTRAST TECHNIQUE: Multidetector CT imaging of the chest  was performed following the standard protocol without IV contrast. COMPARISON:  Chest radiograph performed 12/16/2015 FINDINGS: Cardiovascular: The heart is mildly enlarged. Diffuse coronary artery calcifications are seen. Calcification is noted at the mitral valve. Scattered calcification is noted along the thoracic aorta. The great vessels are grossly unremarkable. Mediastinum/Nodes: The mediastinum is otherwise unremarkable. No mediastinal lymphadenopathy is seen. No pericardial effusion is identified. The thyroid gland is grossly unremarkable. No axillary lymphadenopathy is seen. The patient is status post median sternotomy. Lungs/Pleura: Nodular interstitial opacities are noted at the lung bases. This may reflect scarring, atypical infection, or mild interstitial lung disease. No pleural effusion or pneumothorax is seen. Upper Abdomen: The visualized portions of the liver and spleen are grossly unremarkable. The visualized portions of the pancreas and adrenal glands are grossly unremarkable. There is mild left renal atrophy. Nonobstructing bilateral renal stones are seen, measuring up to 3 mm in size. Musculoskeletal: No acute osseous abnormalities are identified. The visualized musculature is unremarkable in appearance. IMPRESSION: 1. Nodular interstitial opacities at the lung bases. This may reflect scarring, atypical infection or mild interstitial lung disease. 2. Mild cardiomegaly. Diffuse coronary artery calcifications seen. Calcification at the mitral valve. 3. Mild left renal atrophy. Nonobstructing bilateral renal stones, measuring up to 3 mm in size. Electronically Signed   By: Roanna RaiderJeffery  Chang M.D.   On: 12/19/2015 00:49   Ct Maxillofacial Wo Contrast  Result Date: 12/19/2015 CLINICAL DATA:  70 year old female with recent fall. EXAM: CT MAXILLOFACIAL WITHOUT CONTRAST TECHNIQUE: Multidetector CT imaging of the maxillofacial structures was performed. Multiplanar CT image reconstructions were also  generated. A small metallic BB was placed on the right temple in order to reliably differentiate right from left. COMPARISON:  None. FINDINGS: Osseous: No acute fracture or dislocation. Chronic degenerative changes of the temporomandibular joints. The bones are osteopenic. Orbits: Negative. No traumatic or inflammatory finding. Sinuses: Clear. Soft tissues: Negative. Limited intracranial: No significant or unexpected finding. IMPRESSION: No acute facial bone fractures. Electronically Signed   By: Elgie CollardArash  Radparvar M.D.   On: 12/19/2015 00:58    Cardiac Studies   Echocardiogram: 12/17/2015 Study Conclusions  - Left ventricle: The cavity size was moderately dilated. Systolic function was normal. The estimated ejection fraction was in the range of 55% to 60%. Wall motion was normal; there were no regional wall motion abnormalities. There was a reduced contribution of atrial contraction to ventricular filling, due to increased ventricular diastolic pressure or atrial contractile dysfunction. Doppler parameters are consistent with a reversible restrictive pattern, indicative of decreased left  ventricular diastolic compliance and/or increased left atrial pressure (grade 3 diastolic dysfunction). Doppler parameters are consistent with high ventricular filling pressure. - Mitral valve: Calcified annulus. The findings are consistent with mild stenosis. There was mild to moderate regurgitation directed eccentrically and posteriorly. Mean gradient (D): 3 mm Hg. Valve area by pressure half-time: 1.42 cm^2. Valve area by continuity equation (using LVOT flow): 1.6 cm^2. - Left atrium: The atrium was severely dilated. - Pulmonic valve: There was trivial regurgitation. - Pulmonary arteries: PA peak pressure: 57 mm Hg (S).  Impressions:  - The right ventricular systolic pressure was increased consistent with moderate pulmonary hypertension.  EKG: SB - 48, 514 qtc.  Improved. On admit NSR, HR 60, with no acute ST or T-wave changes. QTC at 602 ms.  Patient Profile     63 with PAF, long QT now off sotalol  Assessment & Plan    1. Prolonged QT - EKG on admission showed NSR, HR 60, with no acute ST or T-wave changes. QTC at 602 ms.  - was on Sotalol 80mg  daily prior to admission. This was stopped held. Celexa previously discontinued by admitting team.  - Initial K+ 3.1 and Mg 1.5. K+ at 4.3 today and Mg at 1.9.  - the fall she describes over the weekend sounds mechanical in nature. No prodromal symptoms or loss of consciousness. Agree she needs to work with PT and would benefit from [facility] PT, but she is reluctant to this. - repeat EKG shows improved Qtc 514.   2. Acute on Chronic Diastolic CHF - Echocardiogram this admission shows preserved EF of 55-60% with grade 3 diastolic dysfunction. BNP elevated to 751 on admission.  - started on IV Lasix 40mg  BID at time of admission. Weight is down 4lbs (191 --> 187 lbs) with a recorded net output of -6.6L. BP this AM 91/54. Will stop IV and restart home lasix 20 QD.  - obtain daily weights and strict I&O's. Reviewed importance of sodium and fluid restriction with the patient. She had been consuming lots of soup prior to admission.   3. Paroxysmal Atrial Fibrillation - Maintaining normal sinus rhythm/brady this admission. Sotalol held as above. Would avoid beta blocker use with recent history of bradycardia. - This patients CHA2DS2-VASc Score and unadjusted Ischemic Stroke Rate (% per year) is equal to 7.2 % stroke rate/year from a score of 5 (CHF, HTN, Vascular, Age, Female). Not on anticoagulation due to history of prior GI bleeds. Understands stroke risks and after talking with GI at prior appt, she would like to try Eliquis again. Will restart. 5mg  BID Hemoglobin stable at 11.5 today.  4. Coronary Artery Disease - status post CABG in 2010. Denies any recent anginal symptoms. - EKG is without acute  ischemic changes.  5. HTN - continue Losartan (unless BP remains very low this AM)  6. Stage 3 CKD - Creatinine stable at 0.89.   From cardiac perspective, we will sign off. Please let us know if we can be of further assistance. Has follow up 11/16.   Signed, Donato Schultz, MD  12/20/2015, 8:17 AM

## 2015-12-20 NOTE — Care Management Important Message (Signed)
Important Message  Patient Details  Name: Gabrielle [name] MRN: [id number] Date of Birth: 1945-12-26   Medicare Important Message Given:  Yes    Haskell FlirtJamison, Adelaida Reindel 12/20/2015, 10:18 AMImportant Message  Patient Details  Name: Gabrielle [name] MRN: [id number] Date of Birth: 1945-12-26   Medicare Important Message Given:  Yes    Haskell FlirtJamison, Deyci Gesell 12/20/2015, 10:18 AM

## 2015-12-21 ENCOUNTER — Inpatient Hospital Stay (HOSPITAL_COMMUNITY): Payer: Medicare Other

## 2015-12-21 LAB — COMPREHENSIVE METABOLIC PANEL
ALT: 10 U/L — ABNORMAL LOW (ref 14–54)
AST: 34 U/L (ref 15–41)
Albumin: 3 g/dL — ABNORMAL LOW (ref 3.5–5.0)
Alkaline Phosphatase: 83 U/L (ref 38–126)
Anion gap: 8 (ref 5–15)
BUN: 17 mg/dL (ref 6–20)
CO2: 34 mmol/L — ABNORMAL HIGH (ref 22–32)
Calcium: 9.1 mg/dL (ref 8.9–10.3)
Chloride: 99 mmol/L — ABNORMAL LOW (ref 101–111)
Creatinine, Ser: 1.06 mg/dL — ABNORMAL HIGH (ref 0.44–1.00)
GFR calc Af Amer: 59 mL/min — ABNORMAL LOW (ref >60)
GFR calc non Af Amer: 51 mL/min — ABNORMAL LOW (ref >60)
Glucose, Bld: 103 mg/dL — ABNORMAL HIGH (ref 65–99)
Potassium: 4.1 mmol/L (ref 3.5–5.1)
Sodium: 141 mmol/L (ref 135–145)
Total Bilirubin: 1.7 mg/dL — ABNORMAL HIGH (ref 0.3–1.2)
Total Protein: 5.4 g/dL — ABNORMAL LOW (ref 6.5–8.1)

## 2015-12-21 LAB — CBC WITH DIFFERENTIAL/PLATELET
Basophils Absolute: 0 10*3/uL (ref 0.0–0.1)
Basophils Relative: 0 %
Eosinophils Absolute: 0.1 10*3/uL (ref 0.0–0.7)
Eosinophils Relative: 1 %
HCT: 33.3 % — ABNORMAL LOW (ref 36.0–46.0)
Hemoglobin: 10.8 g/dL — ABNORMAL LOW (ref 12.0–15.0)
Lymphocytes Relative: 19 %
Lymphs Abs: 1.6 10*3/uL (ref 0.7–4.0)
MCH: 31.5 pg (ref 26.0–34.0)
MCHC: 32.4 g/dL (ref 30.0–36.0)
MCV: 97.1 fL (ref 78.0–100.0)
Monocytes Absolute: 0.8 10*3/uL (ref 0.1–1.0)
Monocytes Relative: 10 %
Neutro Abs: 5.7 10*3/uL (ref 1.7–7.7)
Neutrophils Relative %: 69 %
Platelets: 118 10*3/uL — ABNORMAL LOW (ref 150–400)
RBC: 3.43 MIL/uL — ABNORMAL LOW (ref 3.87–5.11)
RDW: 18.7 % — ABNORMAL HIGH (ref 11.5–15.5)
WBC: 8.2 10*3/uL (ref 4.0–10.5)

## 2015-12-21 LAB — MAGNESIUM: Magnesium: 1.9 mg/dL (ref 1.7–2.4)

## 2015-12-21 LAB — TROPONIN I
Troponin I: <0.03 ng/mL (ref <0.03)
Troponin I: <0.03 ng/mL (ref <0.03)

## 2015-12-21 MED ORDER — METHYLPREDNISOLONE SODIUM SUCC 125 MG IJ SOLR
60.0000 mg | Freq: Every day | INTRAMUSCULAR | Status: DC
  Administered 2015-12-21: 60 mg via INTRAVENOUS
  Filled 2015-12-21 (×2): qty 2

## 2015-12-21 MED ORDER — FLUTICASONE PROPIONATE 50 MCG/ACT NA SUSP
2.0000 | Freq: Two times a day (BID) | NASAL | Status: DC
  Administered 2015-12-21 – 2015-12-28 (×13): 2 via NASAL
  Filled 2015-12-21 (×2): qty 16

## 2015-12-21 NOTE — Progress Notes (Signed)
[facility] PROGRESS NOTE    Progress Note  CHALISE PE  WUJ:[id number] DOB: 1946/01/02 DOA: 12/16/2015 PCP: [name], MD     Brief Narrative:   Gabrielle [name] is an 70 y.o. female past medical history of CAD status post CABG, paroxysmal atrial fibrillation, chronic systolic heart failure was referred to the ED by her PCP for shortness of breath, the patient felt 4 days prior to admission, she did not loose consciousness since the fall she's been having back pain she said her PCP on the day of admission found her to be hypoxic and send her to the ED, M.D. she was found to have bilateral wheezing and crackles chest x-ray showing possible pneumonia with vascular congestion.  Assessment/Plan:   Acute respiratory failure with hypoxia (HCC): Probably due to acute decompensated diastolic heart failure. 2-D echo lvef wnl, does has diastolic dysfunction, cardiac biomarkers have been negative 2. She was treated with IV Lasix, then switched to oral lasix on 10/20, follow strict is and os, daily weights.  crackles and lower extremity edema has much improved, but not able to  wean off oxygen  need to arrange home o2  Suspect also component of COPD exacerbation:  Though edema has much improved, she Continues to report feeling sob, congested, not able to wean off oxygen continue nebs, trial dose steroids, continue oral doxycycline. Repeat cxr two view on 10/21  Chest pain: On 10/21  tender to palpation to left chest wall below left breast, likely atypical, but due to c/o worsening sob and dizziness, will get ekg and cycle troponin.  paroxysmal Atrial fibrillation with controlled ventricular response [facility]): sinus rhythm in the hospital chads 2 vasc score is more than 2 Not on anticoagulation due to GI bleed. Cardiology recommends to restart eliquis Cardiology consulted regarding sotalol in the setting of prolonged QT who recommended d/c sotalol  Prolonged QT: QTC on  presentation D/c celexa,  Keep K.4, mag >2, Cardiology consulted regarding sotalol use in the setting of prolonged QT who recommended d/c sotalol Daily ekg to monitor Qtc, improving  HTN;  bp low normal ion 10/20, reduce cozaar dose, continue monitor   Thrombocytopenia: Unclear etiology, seems slowing improving  Normocytic anemia: Does has h/o recurrent gi bleed, no active bleed during this hospitalization anemia panel unremarkable  Right ear pain, right mastoid process tenderness Otoscope exam reveal opaque tympanic member , no fluids level, no perforation ct maxillofacial no acute finginds, started on ear drops, flonase, doxycycline ( not a candidate for quinolones due to QTc, not a candidate for augmentin due to she is allergic to pcn)  Falls/FTT/poor memory: Not sure question near syncopal has anything to do with the prolonged QTC of 600. PT recommended SNF , patient declines, family requested assisted living placement, social worker consulted  Prior smoker, quit smoking several months ago, h/o copd, no significant wheezing, does has rhonchi and crackles, continue nebs, add mucinex  Ct chest: Nodular interstitial opacities at the lung bases. This may reflect scarring, atypical infection or mild interstitial lung disease.   DVT prophylaxis: was on heparin, now on eliquis Family Communication: patient Disposition Plan/Barrier to D/C: PT recommended SNf, patient declined, family want to try assisted living, social worker consulted, now family want patient to be discharged home with home health, will need to maximize home health, will need home o2 Code Status:     Code Status Orders        Start     Ordered   12/16/15 2148  Full code  Continuous     12/16/15 2148    Code Status History    Date Active Date Inactive Code Status Order ID Comments User Context   10/27/2015  4:16 PM 10/30/2015  6:03 PM Full Code [id number]  Meredith PelPaula M Guenther, NP ED   10/21/2015  6:50 PM  10/25/2015  8:23 PM Full Code [id number]  Jeralyn BennettEzequiel Zamora, MD Inpatient        IV Access:    Peripheral IV   Procedures and diagnostic studies:   No results found.   Medical Consultants:    cardiology  Anti-Infectives:   IV Rocephin and start on 12/17/2015 Doxycycline started on 12/16/2015.  Subjective:    Roderic PalauVirginia W Gubbels she report feeling sob, left sided chest pain, feeling dizzy, still has congested cough, nose is stuffed up  Objective:    Vitals:   12/20/15 2126 12/21/15 0525 12/21/15 0648 12/21/15 0753  BP: (!) 117/59 (!) 130/53    Pulse: (!) 56 61    Resp: 18 17    Temp: 97.8 F (36.6 C) 98.1 F (36.7 C)    TempSrc: Oral Oral    SpO2: 96% 98% 97% 94%  Weight:  78.4 kg (172 lb 12.8 oz)    Height:        Intake/Output Summary (Last 24 hours) at 12/21/15 1026 Last data filed at 12/21/15 0714  Gross per 24 hour  Intake              600 ml  Output             2000 ml  Net            -1400 ml   Filed Weights   12/19/15 0605 12/20/15 0646 12/21/15 0525  Weight: 79.8 kg (176 lb) 85.2 kg (187 lb 13.3 oz) 78.4 kg (172 lb 12.8 oz)    Exam: General exam: frail, but NAD Respiratory system:  Rhonchi and wheezing but better after productive cough Cardiovascular system: S1 & S2 heard, RRR.  Gastrointestinal system: Abdomen is nondistended, soft and nontender.  Central nervous system: Alert and oriented. No focal neurological deficits. Extremities: + lower extremity pitting edema slowing improving Skin: No rashes, lesions or ulcers Psychiatry: Judgement and insight appear normal. Mood & affect appropriate.    Data Reviewed:    Labs: Basic Metabolic Panel:  Recent Labs Lab 12/16/15 2201 12/17/15 0345 12/18/15 0501 12/19/15 0525 12/20/15 0411 12/21/15 0533  NA  --  140 143 142 140 141  K  --  3.6 3.7 4.3 4.4 4.1  CL  --  102 103 101 101 99*  CO2  --  32 33* 33* 34* 34*  GLUCOSE  --  159* 106* 101* 117* 103*  BUN  --  11 10 11 14 17     CREATININE  --  0.94 0.95 0.89 1.05* 1.06*  CALCIUM  --  8.4* 8.8* 9.0 8.6* 9.1  MG 1.5*  --  1.6* 1.9 2.2 1.9   GFR Estimated Creatinine Clearance: 46.5 mL/min (by C-G formula based on SCr of 1.06 mg/dL (H)). Liver Function Tests:  Recent Labs Lab 12/19/15 0525 12/21/15 0533  AST 33 34  ALT 9* 10*  ALKPHOS 83 83  BILITOT 2.7* 1.7*  PROT 5.9* 5.4*  ALBUMIN 3.4* 3.0*   No results for input(s): LIPASE, AMYLASE in the last 168 hours. No results for input(s): AMMONIA in the last 168 hours. Coagulation profile No results for input(s): INR, PROTIME in the last 168 hours.  CBC:  Recent Labs Lab 12/16/15 1545 12/17/15 0345 12/19/15 0525 12/21/15 0533  WBC 8.7 5.2 10.7* 8.2  NEUTROABS  --   --  6.1 5.7  HGB 12.2 10.9* 11.5* 10.8*  HCT 38.3 33.8* 35.9* 33.3*  MCV 97.2 96.3 95.7 97.1  PLT 126* 98* 130* 118*   Cardiac Enzymes:  Recent Labs Lab 12/16/15 2201  TROPONINI <0.03   BNP (last 3 results) No results for input(s): PROBNP in the last 8760 hours. CBG:  Recent Labs Lab 12/16/15 1544 12/19/15 1707  GLUCAP 73 125*   D-Dimer: No results for input(s): DDIMER in the last 72 hours. Hgb A1c: No results for input(s): HGBA1C in the last 72 hours. Lipid Profile: No results for input(s): CHOL, HDL, LDLCALC, TRIG, CHOLHDL, LDLDIRECT in the last 72 hours. Thyroid function studies: No results for input(s): TSH, T4TOTAL, T3FREE, THYROIDAB in the last 72 hours.  Invalid input(s): FREET3 Anemia work up: No results for input(s): VITAMINB12, FOLATE, FERRITIN, TIBC, IRON, RETICCTPCT in the last 72 hours. Sepsis Labs:  Recent Labs Lab 12/16/15 1545 12/17/15 0345 12/19/15 0525 12/21/15 0533  WBC 8.7 5.2 10.7* 8.2   Microbiology Recent Results (from the past 240 hour(s))  MRSA PCR Screening     Status: None   Collection Time: 12/16/15  9:18 PM  Result Value Ref Range Status   MRSA by PCR NEGATIVE NEGATIVE Final    Comment:        The GeneXpert MRSA Assay  (FDA approved for NASAL specimens only), is one component of a comprehensive MRSA colonization surveillance program. It is not intended to diagnose MRSA infection nor to guide or monitor treatment for MRSA infections.   Culture, sputum-assessment     Status: None   Collection Time: 12/18/15  7:03 AM  Result Value Ref Range Status   Specimen Description SPUTUM  Final   Special Requests NONE  Final   Sputum evaluation   Final    THIS SPECIMEN IS ACCEPTABLE. RESPIRATORY CULTURE REPORT TO FOLLOW.   Report Status 12/18/2015 FINAL  Final  Culture, respiratory (NON-Expectorated)     Status: None   Collection Time: 12/18/15  7:03 AM  Result Value Ref Range Status   Specimen Description SPUTUM  Final   Special Requests NONE  Final   Gram Stain   Final    MODERATE WBC PRESENT,BOTH PMN AND MONONUCLEAR FEW SQUAMOUS EPITHELIAL CELLS PRESENT ABUNDANT GRAM POSITIVE COCCI IN CLUSTERS MODERATE GRAM POSITIVE COCCI IN PAIRS FEW YEAST WITH PSEUDOHYPHAE FEW GRAM NEGATIVE COCCOBACILLI RARE GRAM NEGATIVE DIPLOCOCCI    Culture   Final    Consistent with normal respiratory flora. Performed at [facility]    Report Status 12/20/2015 FINAL  Final     Medications:   . apixaban  5 mg Oral BID  . atorvastatin  20 mg Oral QPM  . ciprofloxacin-dexamethasone  4 drop Both Ears BID  . doxycycline  100 mg Oral Q12H  . fluticasone  2 spray Each Nare Daily  . furosemide  40 mg Intravenous Q12H  . furosemide  20 mg Oral Daily  . guaiFENesin  600 mg Oral BID  . ipratropium-albuterol  3 mL Nebulization TID  . losartan  25 mg Oral Daily  . mometasone-formoterol  2 puff Inhalation BID  . pantoprazole  40 mg Oral QAC breakfast  . polyethylene glycol  17 g Oral Daily  . senna-docusate  1 tablet Oral BID  . sodium chloride flush  3 mL Intravenous Q12H   Continuous Infusions:  LOS: 5 days    Time spent:  Liban Guedes MD PhD  [facility] Pager [phone]  *Please refer to  [email or web address], password [email or web address] to get updated schedule on who will round on this patient, as hospitalists switch teams weekly. If 7PM-7AM, please contact night-coverage at [email or web address], password [email or web address] for any overnight needs.  12/21/2015, 10:26 AM

## 2015-12-22 ENCOUNTER — Encounter (HOSPITAL_COMMUNITY): Payer: Self-pay | Admitting: Radiology

## 2015-12-22 ENCOUNTER — Inpatient Hospital Stay (HOSPITAL_COMMUNITY): Payer: Medicare Other

## 2015-12-22 LAB — CBC
HCT: 32.9 % — ABNORMAL LOW (ref 36.0–46.0)
Hemoglobin: 10.9 g/dL — ABNORMAL LOW (ref 12.0–15.0)
MCH: 31.8 pg (ref 26.0–34.0)
MCHC: 33.1 g/dL (ref 30.0–36.0)
MCV: 95.9 fL (ref 78.0–100.0)
Platelets: 125 10*3/uL — ABNORMAL LOW (ref 150–400)
RBC: 3.43 MIL/uL — ABNORMAL LOW (ref 3.87–5.11)
RDW: 18.6 % — ABNORMAL HIGH (ref 11.5–15.5)
WBC: 11.9 10*3/uL — ABNORMAL HIGH (ref 4.0–10.5)

## 2015-12-22 LAB — COMPREHENSIVE METABOLIC PANEL
ALT: 10 U/L — ABNORMAL LOW (ref 14–54)
AST: 31 U/L (ref 15–41)
Albumin: 3.1 g/dL — ABNORMAL LOW (ref 3.5–5.0)
Alkaline Phosphatase: 83 U/L (ref 38–126)
Anion gap: 6 (ref 5–15)
BUN: 17 mg/dL (ref 6–20)
CO2: 33 mmol/L — ABNORMAL HIGH (ref 22–32)
Calcium: 9.4 mg/dL (ref 8.9–10.3)
Chloride: 100 mmol/L — ABNORMAL LOW (ref 101–111)
Creatinine, Ser: 0.95 mg/dL (ref 0.44–1.00)
GFR calc Af Amer: >60 mL/min (ref >60)
GFR calc non Af Amer: 58 mL/min — ABNORMAL LOW (ref >60)
Glucose, Bld: 123 mg/dL — ABNORMAL HIGH (ref 65–99)
Potassium: 4.4 mmol/L (ref 3.5–5.1)
Sodium: 139 mmol/L (ref 135–145)
Total Bilirubin: 1.7 mg/dL — ABNORMAL HIGH (ref 0.3–1.2)
Total Protein: 5.6 g/dL — ABNORMAL LOW (ref 6.5–8.1)

## 2015-12-22 LAB — TROPONIN I: Troponin I: <0.03 ng/mL (ref <0.03)

## 2015-12-22 MED ORDER — DEXTROSE 5 % IV SOLN
5.0000 mg/h | INTRAVENOUS | Status: DC
  Administered 2015-12-22: 10 mg/h via INTRAVENOUS
  Administered 2015-12-22: 5 mg/h via INTRAVENOUS
  Administered 2015-12-22: 7.5 mg/h via INTRAVENOUS
  Administered 2015-12-22: 10 mg/h via INTRAVENOUS
  Filled 2015-12-22 (×2): qty 100

## 2015-12-22 MED ORDER — DEXTROSE 5 % IV SOLN
1.0000 g | Freq: Three times a day (TID) | INTRAVENOUS | Status: DC
  Administered 2015-12-22 – 2015-12-25 (×9): 1 g via INTRAVENOUS
  Filled 2015-12-22 (×10): qty 1

## 2015-12-22 MED ORDER — LEVALBUTEROL HCL 0.63 MG/3ML IN NEBU
0.6300 mg | INHALATION_SOLUTION | Freq: Three times a day (TID) | RESPIRATORY_TRACT | Status: DC
  Administered 2015-12-22 – 2015-12-26 (×11): 0.63 mg via RESPIRATORY_TRACT
  Filled 2015-12-22 (×11): qty 3

## 2015-12-22 MED ORDER — HYDRALAZINE HCL 20 MG/ML IJ SOLN: 10.0000 mg | Freq: Four times a day (QID) | INTRAMUSCULAR | Status: DC | PRN

## 2015-12-22 MED ORDER — CLINDAMYCIN HCL 300 MG PO CAPS
300.0000 mg | ORAL_CAPSULE | Freq: Three times a day (TID) | ORAL | Status: DC
  Administered 2015-12-22 – 2015-12-23 (×3): 300 mg via ORAL
  Filled 2015-12-22 (×4): qty 1

## 2015-12-22 MED ORDER — METOPROLOL TARTRATE 5 MG/5ML IV SOLN
5.0000 mg | Freq: Once | INTRAVENOUS | Status: AC
  Administered 2015-12-22: 5 mg via INTRAVENOUS
  Filled 2015-12-22: qty 5

## 2015-12-22 NOTE — Progress Notes (Signed)
Patient has converted to sinus rhythm with frequent arrhythmia.  Boyce MediciBrittany Simmons cards PA is aware.  Patient BP is stable, cardizem drip currently at 10mg /h.  Will continue to monitor.

## 2015-12-22 NOTE — Progress Notes (Signed)
[facility] PROGRESS NOTE    Progress Note  KEOSHIA STEINMETZ  ZOX:[id number] DOB: 1945/11/03 DOA: 12/16/2015 PCP: [name], MD     Brief Narrative:   Gabrielle [name] is an 70 y.o. female past medical history of CAD status post CABG, paroxysmal atrial fibrillation, chronic systolic heart failure was referred to the ED by her PCP for shortness of breath, the patient felt 4 days prior to admission, she did not loose consciousness since the fall she's been having back pain she said her PCP on the day of admission found her to be hypoxic and send her to the ED, M.D. she was found to have bilateral wheezing and crackles chest x-ray showing possible pneumonia with vascular congestion.  Assessment/Plan:   Acute respiratory failure with hypoxia (HCC): Probably due to acute decompensated diastolic heart failure. 2-D echo lvef wnl, does has diastolic dysfunction, cardiac biomarkers have been negative 2. She was treated with IV Lasix, then switched to oral lasix on 10/20, follow strict is and os, daily weights.  crackles and lower extremity edema has much improved, but not able to  wean off oxygen  need to arrange home o2  Suspect also component of COPD exacerbation:  Though edema has much improved, she Continues to report feeling sob, congested, not able to wean off oxygen continue nebs, trial dose steroids, continue oral doxycycline. Repeat cxr two view on 10/21, with concerning right sided infiltrate, will repeat Ct chest on 10/22  Chest pain: On 10/21  tender to palpation to left chest wall below left breast, likely atypical, but due to c/o worsening sob and dizziness,  Troponin negative  ? Epigastric pain? Vs rib pain from coughing,  She Report acid reflux and chocked with a piece of chicken, will get swallow eval, d/c steroid, continue ppi, aspiration precaution,  will get ct chest, will  change abx to cover aspiration pna now it seems patient condition worsening with  increased cough, and developed afib/rvr. abx choice is limited due to pcn allergy and Qtc prolongation.  Will d/c doxycycline, changed to aztreonam for gram negative coverage, will add oral cleocin for anaerobe coverage ( not able to use flagyl due to concerning for Qtc )   paroxysmal Atrial fibrillation with controlled ventricular response [facility]): sinus rhythm in the hospital until 10/22 chads 2 vasc score is more than 2 Not on anticoagulation due to GI bleed. Cardiology recommends to restart eliquis Cardiology consulted regarding sotalol in the setting of prolonged QT who recommended d/c sotalol Cardiology signed off and reconsulted to due afib/rvr on 10/22 She is started on cardizem drip per cardiology, cardiology input appreciated.  Prolonged QT: QTC on presentation D/c celexa,  Keep K.4, mag >2, Cardiology consulted regarding sotalol use in the setting of prolonged QT who recommended d/c sotalol Daily ekg to monitor Qtc, improving  HTN;  bp low normal on 10/20, reduce cozaar dose,  bp elevated on 10/22, add prn hydralazine   Thrombocytopenia: Unclear etiology, seems slowing improving  Normocytic anemia: Does has h/o recurrent gi bleed, no active bleed during this hospitalization anemia panel unremarkable  Right ear pain, right mastoid process tenderness Otoscope exam reveal opaque tympanic member , no fluids level, no perforation ct maxillofacial no acute finginds, started on ear drops, flonase, doxycycline ( not a candidate for quinolones due to QTc, not a candidate for augmentin due to she is allergic to pcn)  Falls/FTT/poor memory: Not sure question near syncopal has anything to do with the prolonged QTC of 600.  PT recommended SNF , patient declines, family requested assisted living placement, social worker consulted  Prior smoker, quit smoking several months ago, h/o copd, no significant wheezing, does has rhonchi and crackles, continue nebs, add mucinex  Ct  chest: Nodular interstitial opacities at the lung bases. This may reflect scarring, atypical infection or mild interstitial lung disease.   DVT prophylaxis: was on heparin, now on eliquis Family Communication: patient Disposition Plan/Barrier to D/C: PT recommended SNf, patient declined, family want to try assisted living, social worker consulted, now family want patient to be discharged home with home health, will need to maximize home health, will need home o2 Code Status:     Code Status Orders        Start     Ordered   12/16/15 2148  Full code  Continuous     12/16/15 2148    Code Status History    Date Active Date Inactive Code Status Order ID Comments User Context   10/27/2015  4:16 PM 10/30/2015  6:03 PM Full Code [id number]  Meredith Pel, NP ED   10/21/2015  6:50 PM 10/25/2015  8:23 PM Full Code [id number]  Jeralyn Bennett, MD Inpatient        IV Access:    Peripheral IV   Procedures and diagnostic studies:   Dg Chest 2 View  Result Date: 12/21/2015 CLINICAL DATA:  New onset productive cough this morning with shortness-of-breath and weakness. Mild hypoxia. EXAM: CHEST  2 VIEW COMPARISON:  12/16/2015, 11/27/2009 and chest CT 12/18/2015 FINDINGS: Median sternotomy wires are unchanged. Lungs are adequately inflated with minimal linear scarring over the left midlung and left base unchanged. Hazy opacification over the right middle lobe without significant change. Mild stable cardiomegaly. Calcified plaque is present over the aortic arch. There are degenerative changes of the spine. IMPRESSION: Hazy opacification over the right middle lobe unchanged and may be due to atelectasis or infection. Mild stable cardiomegaly. Aortic atherosclerosis. Electronically Signed   By: Elberta Fortis M.D.   On: 12/21/2015 16:00     Medical Consultants:    cardiology  Anti-Infectives:   IV Rocephin and start on 12/17/2015 Doxycycline started on 12/16/2015.  Subjective:     [name] she report bilateral rib pain, continued cough, developed afib/rvr this am, reported chocked on a pieced of chicken  Continue nasal congestion, ear pain is better  Objective:    Vitals:   12/21/15 1338 12/21/15 1533 12/21/15 2117 12/22/15 0649  BP:  (!) 128/59 (!) 159/61 (!) 127/55  Pulse:  (!) 52 73 74  Resp:  16 18 18   Temp:  98.9 F (37.2 C) 98.3 F (36.8 C) 98.9 F (37.2 C)  TempSrc:  Oral Oral Oral  SpO2: 91%  93% 96%  Weight:    76.9 kg (169 lb 8 oz)  Height:        Intake/Output Summary (Last 24 hours) at 12/22/15 0819 Last data filed at 12/22/15 0655  Gross per 24 hour  Intake              360 ml  Output             1300 ml  Net             -940 ml   Filed Weights   12/20/15 0646 12/21/15 0525 12/22/15 0649  Weight: 85.2 kg (187 lb 13.3 oz) 78.4 kg (172 lb 12.8 oz) 76.9 kg (169 lb 8 oz)    Exam: General exam: frail, dose  not look comfortable Respiratory system:  No wheezing this am, does has mild crackles at basis Cardiovascular system: S1 & S2 heard, RRR.  Gastrointestinal system: Abdomen is nondistended, soft and nontender.  Central nervous system: Alert and oriented. No focal neurological deficits. Extremities: + lower extremity pitting edema slowing improving Skin: No rashes, lesions or ulcers Psychiatry: Judgement and insight appear normal. Mood & affect appropriate.    Data Reviewed:    Labs: Basic Metabolic Panel:  Recent Labs Lab 12/16/15 2201  12/18/15 0501 12/19/15 0525 12/20/15 0411 12/21/15 0533 12/22/15 0531  NA  --   < > 143 142 140 141 139  K  --   < > 3.7 4.3 4.4 4.1 4.4  CL  --   < > 103 101 101 99* 100*  CO2  --   < > 33* 33* 34* 34* 33*  GLUCOSE  --   < > 106* 101* 117* 103* 123*  BUN  --   < > 10 11 14 17 17   CREATININE  --   < > 0.95 0.89 1.05* 1.06* 0.95  CALCIUM  --   < > 8.8* 9.0 8.6* 9.1 9.4  MG 1.5*  --  1.6* 1.9 2.2 1.9  --   < > = values in this interval not displayed. GFR Estimated  Creatinine Clearance: 51.4 mL/min (by C-G formula based on SCr of 0.95 mg/dL). Liver Function Tests:  Recent Labs Lab 12/19/15 0525 12/21/15 0533 12/22/15 0531  AST 33 34 31  ALT 9* 10* 10*  ALKPHOS 83 83 83  BILITOT 2.7* 1.7* 1.7*  PROT 5.9* 5.4* 5.6*  ALBUMIN 3.4* 3.0* 3.1*   No results for input(s): LIPASE, AMYLASE in the last 168 hours. No results for input(s): AMMONIA in the last 168 hours. Coagulation profile No results for input(s): INR, PROTIME in the last 168 hours.  CBC:  Recent Labs Lab 12/16/15 1545 12/17/15 0345 12/19/15 0525 12/21/15 0533 12/22/15 0531  WBC 8.7 5.2 10.7* 8.2 11.9*  NEUTROABS  --   --  6.1 5.7  --   HGB 12.2 10.9* 11.5* 10.8* 10.9*  HCT 38.3 33.8* 35.9* 33.3* 32.9*  MCV 97.2 96.3 95.7 97.1 95.9  PLT 126* 98* 130* 118* 125*   Cardiac Enzymes:  Recent Labs Lab 12/16/15 2201 12/21/15 1123 12/21/15 1755 12/22/15 0005  TROPONINI <0.03 <0.03 <0.03 <0.03   BNP (last 3 results) No results for input(s): PROBNP in the last 8760 hours. CBG:  Recent Labs Lab 12/16/15 1544 12/19/15 1707  GLUCAP 73 125*   D-Dimer: No results for input(s): DDIMER in the last 72 hours. Hgb A1c: No results for input(s): HGBA1C in the last 72 hours. Lipid Profile: No results for input(s): CHOL, HDL, LDLCALC, TRIG, CHOLHDL, LDLDIRECT in the last 72 hours. Thyroid function studies: No results for input(s): TSH, T4TOTAL, T3FREE, THYROIDAB in the last 72 hours.  Invalid input(s): FREET3 Anemia work up: No results for input(s): VITAMINB12, FOLATE, FERRITIN, TIBC, IRON, RETICCTPCT in the last 72 hours. Sepsis Labs:  Recent Labs Lab 12/17/15 0345 12/19/15 0525 12/21/15 0533 12/22/15 0531  WBC 5.2 10.7* 8.2 11.9*   Microbiology Recent Results (from the past 240 hour(s))  MRSA PCR Screening     Status: None   Collection Time: 12/16/15  9:18 PM  Result Value Ref Range Status   MRSA by PCR NEGATIVE NEGATIVE Final    Comment:        The GeneXpert  MRSA Assay (FDA approved for NASAL specimens only), is one component  of a comprehensive MRSA colonization surveillance program. It is not intended to diagnose MRSA infection nor to guide or monitor treatment for MRSA infections.   Culture, sputum-assessment     Status: None   Collection Time: 12/18/15  7:03 AM  Result Value Ref Range Status   Specimen Description SPUTUM  Final   Special Requests NONE  Final   Sputum evaluation   Final    THIS SPECIMEN IS ACCEPTABLE. RESPIRATORY CULTURE REPORT TO FOLLOW.   Report Status 12/18/2015 FINAL  Final  Culture, respiratory (NON-Expectorated)     Status: None   Collection Time: 12/18/15  7:03 AM  Result Value Ref Range Status   Specimen Description SPUTUM  Final   Special Requests NONE  Final   Gram Stain   Final    MODERATE WBC PRESENT,BOTH PMN AND MONONUCLEAR FEW SQUAMOUS EPITHELIAL CELLS PRESENT ABUNDANT GRAM POSITIVE COCCI IN CLUSTERS MODERATE GRAM POSITIVE COCCI IN PAIRS FEW YEAST WITH PSEUDOHYPHAE FEW GRAM NEGATIVE COCCOBACILLI RARE GRAM NEGATIVE DIPLOCOCCI    Culture   Final    Consistent with normal respiratory flora. Performed at [facility]    Report Status 12/20/2015 FINAL  Final     Medications:   . apixaban  5 mg Oral BID  . atorvastatin  20 mg Oral QPM  . ciprofloxacin-dexamethasone  4 drop Both Ears BID  . doxycycline  100 mg Oral Q12H  . fluticasone  2 spray Each Nare BID  . furosemide  20 mg Oral Daily  . guaiFENesin  600 mg Oral BID  . ipratropium-albuterol  3 mL Nebulization TID  . losartan  25 mg Oral Daily  . methylPREDNISolone (SOLU-MEDROL) injection  60 mg Intravenous Daily  . metoprolol  5 mg Intravenous Once  . mometasone-formoterol  2 puff Inhalation BID  . pantoprazole  40 mg Oral QAC breakfast  . polyethylene glycol  17 g Oral Daily  . senna-docusate  1 tablet Oral BID  . sodium chloride flush  3 mL Intravenous Q12H   Continuous Infusions:     LOS: 6 days    Time spent:   Tamarius Rosenfield MD PhD  [facility] Pager [phone]  *Please refer to [email or web address], password [email or web address] to get updated schedule on who will round on this patient, as hospitalists switch teams weekly. If 7PM-7AM, please contact night-coverage at [email or web address], password [email or web address] for any overnight needs.  12/22/2015, 8:19 AM

## 2015-12-22 NOTE — Progress Notes (Signed)
Patient HR 120s-130s sustained.  BP still elevated.  Dr. Roda ShuttersXu aware; on floor to see patient.  Cardizem has been titrated per order to 7.5 mg.  Will continue to monitor.

## 2015-12-22 NOTE — Progress Notes (Signed)
Pharmacy Antibiotic Note  Gabrielle [name] is a 70 y.o. female admitted on 12/16/2015 with ADHF and possible COPD exacerbation, started on PO doxycycline.  Now concern for aspiration PNA and pharmacy has been consulted for aztreonam dosing (allergy to penicillins).  MD changing doxycycline to PO clindamycin (avoiding flagyl due to QTc prolongation).    10/22:  SCr 0.95, CrCl ~51 ml/min  Plan: Aztreonam 1g IV q8h Clindamycin 300mg  PO q8h (MD dosing) F/u renal function, cultures, clinical course  Height: 5\' 2"  (157.5 cm) Weight: 169 lb 8 oz (76.9 kg) IBW/kg (Calculated) : 50.1  Temp (24hrs), Avg:98.8 F (37.1 C), Min:98.3 F (36.8 C), Max:98.9 F (37.2 C)   Recent Labs Lab 12/16/15 1545 12/17/15 0345 12/18/15 0501 12/19/15 0525 12/20/15 0411 12/21/15 0533 12/22/15 0531  WBC 8.7 5.2  --  10.7*  --  8.2 11.9*  CREATININE 1.01* 0.94 0.95 0.89 1.05* 1.06* 0.95    Estimated Creatinine Clearance: 51.4 mL/min (by C-G formula based on SCr of 0.95 mg/dL).    Allergies  Allergen Reactions  . Novocain [Procaine] Hives and Other (See Comments)    palpitations  . Penicillins Hives    Has patient had a PCN reaction causing immediate rash, facial/tongue/throat swelling, SOB or lightheadedness with hypotension: Yes Has patient had a PCN reaction causing severe rash involving mucus membranes or skin necrosis: No Has patient had a PCN reaction that required hospitalization No Has patient had a PCN reaction occurring within the last 10 years: No If all of the above answers are "NO", then may proceed with Cephalosporin use.    Antimicrobials this admission: 10/17 Doxycycline >> 10/22 10/22 Clinda PO >>  10/22 Aztreonam >>  Dose adjustments this admission:  Microbiology results:  10/18 Sputum: Normal flora  10/16 MRSA PCR: Negative  Thank you for allowing pharmacy to be a part of this patient's care.  Haynes Hoehnolleen Denney Shein, PharmD, BCPS 12/22/2015, 12:05 PM  Pager: [phone] s

## 2015-12-22 NOTE — Progress Notes (Signed)
Patient Name: Gabrielle [name] Date of Encounter: 12/22/2015  Primary Cardiologist: [facility] Problem List     Principal Problem:   Acute respiratory failure with hypoxia [facility]) Active Problems:   COPD exacerbation (HCC)   Atrial fibrillation with controlled ventricular response (HCC)   Acute congestive heart failure (HCC)   Community acquired pneumonia of right middle lobe of lung (HCC)   Fall   Prolonged Q-T interval on ECG   Chronic anticoagulation     Subjective   Went into a-fib with RVR this morning. History of prior GIB - was off anticoagulation and then restarted on Eliquis 5 mg BID. Has had problems with long QTc on sotalol and bradycardia as well.  Inpatient Medications    Scheduled Meds: . apixaban  5 mg Oral BID  . atorvastatin  20 mg Oral QPM  . ciprofloxacin-dexamethasone  4 drop Both Ears BID  . doxycycline  100 mg Oral Q12H  . fluticasone  2 spray Each Nare BID  . furosemide  20 mg Oral Daily  . guaiFENesin  600 mg Oral BID  . ipratropium-albuterol  3 mL Nebulization TID  . losartan  25 mg Oral Daily  . methylPREDNISolone (SOLU-MEDROL) injection  60 mg Intravenous Daily  . mometasone-formoterol  2 puff Inhalation BID  . pantoprazole  40 mg Oral QAC breakfast  . polyethylene glycol  17 g Oral Daily  . senna-docusate  1 tablet Oral BID  . sodium chloride flush  3 mL Intravenous Q12H   Continuous Infusions:   PRN Meds: acetaminophen **OR** acetaminophen, ALPRAZolam, ipratropium-albuterol, ondansetron **OR** ondansetron (ZOFRAN) IV, polyvinyl alcohol, tiZANidine, traMADol   Vital Signs    Vitals:   12/21/15 1533 12/21/15 2117 12/22/15 0649 12/22/15 0800  BP: (!) 128/59 (!) 159/61 (!) 127/55 (!) 182/108  Pulse: (!) 52 73 74 (!) 155  Resp: 16 18 18 20   Temp: 98.9 F (37.2 C) 98.3 F (36.8 C) 98.9 F (37.2 C) 98.9 F (37.2 C)  TempSrc: Oral Oral Oral Oral  SpO2:  93% 96% 96%  Weight:   169 lb 8 oz (76.9 kg)   Height:         Intake/Output Summary (Last 24 hours) at 12/22/15 0831 Last data filed at 12/22/15 0655  Gross per 24 hour  Intake              360 ml  Output             1300 ml  Net             -940 ml   Filed Weights   12/20/15 0646 12/21/15 0525 12/22/15 0649  Weight: 187 lb 13.3 oz (85.2 kg) 172 lb 12.8 oz (78.4 kg) 169 lb 8 oz (76.9 kg)    Physical Exam    GEN: Well nourished, well developed, in no acute distress.  HEENT: Grossly normal.  Neck: Supple, no JVD, carotid bruits, or masses. Cardiac: irregularly irregular, 2/6 SEM at [place]. No clubbing, cyanosis, edema.  Radials/DP/PT 2+ and equal bilaterally.  Respiratory:  Respirations regular and unlabored, clear to auscultation bilaterally. GI: Soft, nontender, nondistended, BS + x 4. MS: no deformity or atrophy. Skin: warm and dry, no rash. Neuro:  Strength and sensation are intact. Psych: AAOx3.  Normal affect.  Labs    CBC  Recent Labs  12/21/15 0533 12/22/15 0531  WBC 8.2 11.9*  NEUTROABS 5.7  --   HGB 10.8* 10.9*  HCT 33.3* 32.9*  MCV 97.1 95.9  PLT  118* 125*   Basic Metabolic Panel  Recent Labs  12/20/15 0411 12/21/15 0533 12/22/15 0531  NA 140 141 139  K 4.4 4.1 4.4  CL 101 99* 100*  CO2 34* 34* 33*  GLUCOSE 117* 103* 123*  BUN 14 17 17   CREATININE 1.05* 1.06* 0.95  CALCIUM 8.6* 9.1 9.4  MG 2.2 1.9  --    Liver Function Tests  Recent Labs  12/21/15 0533 12/22/15 0531  AST 34 31  ALT 10* 10*  ALKPHOS 83 83  BILITOT 1.7* 1.7*  PROT 5.4* 5.6*  ALBUMIN 3.0* 3.1*   No results for input(s): LIPASE, AMYLASE in the last 72 hours. Cardiac Enzymes  Recent Labs  12/21/15 1123 12/21/15 1755 12/22/15 0005  TROPONINI <0.03 <0.03 <0.03   BNP Invalid input(s): POCBNP D-Dimer No results for input(s): DDIMER in the last 72 hours. Hemoglobin A1C No results for input(s): HGBA1C in the last 72 hours. Fasting Lipid Panel No results for input(s): CHOL, HDL, LDLCALC, TRIG, CHOLHDL, LDLDIRECT in the last  72 hours. Thyroid Function Tests No results for input(s): TSH, T4TOTAL, T3FREE, THYROIDAB in the last 72 hours.  Invalid input(s): FREET3  Telemetry    A-fib with RVR  ECG    --  Radiology    Dg Chest 2 View  Result Date: 12/21/2015 CLINICAL DATA:  New onset productive cough this morning with shortness-of-breath and weakness. Mild hypoxia. EXAM: CHEST  2 VIEW COMPARISON:  12/16/2015, 11/27/2009 and chest CT 12/18/2015 FINDINGS: Median sternotomy wires are unchanged. Lungs are adequately inflated with minimal linear scarring over the left midlung and left base unchanged. Hazy opacification over the right middle lobe without significant change. Mild stable cardiomegaly. Calcified plaque is present over the aortic arch. There are degenerative changes of the spine. IMPRESSION: Hazy opacification over the right middle lobe unchanged and may be due to atelectasis or infection. Mild stable cardiomegaly. Aortic atherosclerosis. Electronically Signed   By: Elberta Fortis M.D.   On: 12/21/2015 16:00    Cardiac Studies   Echocardiogram: 12/17/2015 Study Conclusions  - Left ventricle: The cavity size was moderately dilated. Systolic function was normal. The estimated ejection fraction was in the range of 55% to 60%. Wall motion was normal; there were no regional wall motion abnormalities. There was a reduced contribution of atrial contraction to ventricular filling, due to increased ventricular diastolic pressure or atrial contractile dysfunction. Doppler parameters are consistent with a reversible restrictive pattern, indicative of decreased left ventricular diastolic compliance and/or increased left atrial pressure (grade 3 diastolic dysfunction). Doppler parameters are consistent with high ventricular filling pressure. - Mitral valve: Calcified annulus. The findings are consistent with mild stenosis. There was mild to moderate regurgitation  directed eccentrically and posteriorly. Mean gradient (D): 3 mm Hg. Valve area by pressure half-time: 1.42 cm^2. Valve area by continuity equation (using LVOT flow): 1.6 cm^2. - Left atrium: The atrium was severely dilated. - Pulmonic valve: There was trivial regurgitation. - Pulmonary arteries: PA peak pressure: 57 mm Hg (S).  Impressions:  - The right ventricular systolic pressure was increased consistent with moderate pulmonary hypertension.  EKG: SB - 48, 514 qtc. Improved. On admit NSR, HR 60, with no acute ST or T-wave changes. QTC at 602 ms.  Patient Profile     57 with PAF, long QT now off sotalol - bradycardia with recurrent a-fib and RVR  Assessment & Plan    1. Prolonged QT - EKG on admission showed NSR, HR 60, with no acute ST or T-wave  changes. QTC at 602 ms.  - was on Sotalol 80mg  daily prior to admission. This was stopped held. Celexa previously discontinued by admitting team.  - Initial K+ 3.1 and Mg 1.5. K+ at 4.3 today and Mg at 1.9.  - the fall she describes over the weekend sounds mechanical in nature. No prodromal symptoms or loss of consciousness. Agree she needs to work with PT and [facility], but she is reluctant to this. - repeat EKG shows improved Qtc 514.   2. Acute on Chronic Diastolic CHF - Echocardiogram this admission shows preserved EF of 55-60% with grade 3 diastolic dysfunction. BNP elevated to 751 on admission.  - started on IV Lasix 40mg  BID at time of admission. Weight is down 4lbs (191 --> 187 lbs) with a recorded net output of -6.6L. BP this AM 91/54. On home lasix 20 QD - continues to be net negative. - obtain daily weights and strict I&O's. Reviewed importance of sodium and fluid restriction with the patient. She had been consuming lots of soup prior to admission.   3. Paroxysmal Atrial Fibrillation - Back in a-fib with RVR today - has been off all rate-controlling agents due to bradycardia. Will start IV  diltiazem which can be quickly discontinued if need be for bradycardia. There are features concerning for tachy-brady syndrome and a pacemaker may ultimately be necessary. - This patients CHA2DS2-VASc Score and unadjusted Ischemic Stroke Rate (% per year) is equal to 7.2 % stroke rate/year from a score of 5 (CHF, HTN, Vascular, Age, Female). On Eliquis 5mg  BID  4. Coronary Artery Disease - status post CABG in 2010. Denies any recent anginal symptoms. - EKG is without acute ischemic changes. -Troponin negative x 3  5. HTN - continue Losartan (unless BP remains very low this AM)  6. Stage 3 CKD - Creatinine stable at 0.89.   Cardiology will continue to follow with you this week.  Chrystie NoseKenneth C. Alonni Heimsoth, MD, [facility] CenterFACC Attending Cardiologist [facility]  Chrystie NoseKenneth C Hesham Womac, MD  12/22/2015, 8:31 AM

## 2015-12-22 NOTE — Progress Notes (Signed)
Patient HR in A. Fib this AM with HR up to 150s-170s sustained.  Patient laying in bed at this time but says she can feel her heart racing; patient aware that she is not to get out of bed at this time. BP elevated. Dr. Roda ShuttersXu aware; Dr. Rennis GoldenHilty has seen patient. Will start cardizem drip per Dr. Blanchie DessertHilty's order, and administer 5 mg Lopressor IV/ morning PO BP meds as ordered by Dr. Roda ShuttersXu.

## 2015-12-23 ENCOUNTER — Inpatient Hospital Stay (HOSPITAL_COMMUNITY): Payer: Medicare Other

## 2015-12-23 DIAGNOSIS — J181 Lobar pneumonia, unspecified organism: Secondary | ICD-10-CM

## 2015-12-23 LAB — URINE MICROSCOPIC-ADD ON

## 2015-12-23 LAB — COMPREHENSIVE METABOLIC PANEL
ALT: 11 U/L — ABNORMAL LOW (ref 14–54)
AST: 32 U/L (ref 15–41)
Albumin: 3.2 g/dL — ABNORMAL LOW (ref 3.5–5.0)
Alkaline Phosphatase: 80 U/L (ref 38–126)
Anion gap: 6 (ref 5–15)
BUN: 21 mg/dL — ABNORMAL HIGH (ref 6–20)
CO2: 30 mmol/L (ref 22–32)
Calcium: 9 mg/dL (ref 8.9–10.3)
Chloride: 103 mmol/L (ref 101–111)
Creatinine, Ser: 1.21 mg/dL — ABNORMAL HIGH (ref 0.44–1.00)
GFR calc Af Amer: 51 mL/min — ABNORMAL LOW (ref >60)
GFR calc non Af Amer: 44 mL/min — ABNORMAL LOW (ref >60)
Glucose, Bld: 134 mg/dL — ABNORMAL HIGH (ref 65–99)
Potassium: 4.5 mmol/L (ref 3.5–5.1)
Sodium: 139 mmol/L (ref 135–145)
Total Bilirubin: 2 mg/dL — ABNORMAL HIGH (ref 0.3–1.2)
Total Protein: 5.7 g/dL — ABNORMAL LOW (ref 6.5–8.1)

## 2015-12-23 LAB — URINALYSIS, ROUTINE W REFLEX MICROSCOPIC
Bilirubin Urine: NEGATIVE
Glucose, UA: NEGATIVE mg/dL
Ketones, ur: NEGATIVE mg/dL
Nitrite: NEGATIVE
Protein, ur: NEGATIVE mg/dL
Specific Gravity, Urine: 1.017 (ref 1.005–1.030)
pH: 6 (ref 5.0–8.0)

## 2015-12-23 LAB — CBC
HCT: 35.7 % — ABNORMAL LOW (ref 36.0–46.0)
Hemoglobin: 11.2 g/dL — ABNORMAL LOW (ref 12.0–15.0)
MCH: 30.9 pg (ref 26.0–34.0)
MCHC: 31.4 g/dL (ref 30.0–36.0)
MCV: 98.6 fL (ref 78.0–100.0)
Platelets: 125 10*3/uL — ABNORMAL LOW (ref 150–400)
RBC: 3.62 MIL/uL — ABNORMAL LOW (ref 3.87–5.11)
RDW: 18.7 % — ABNORMAL HIGH (ref 11.5–15.5)
WBC: 11.7 10*3/uL — ABNORMAL HIGH (ref 4.0–10.5)

## 2015-12-23 LAB — MAGNESIUM: Magnesium: 1.6 mg/dL — ABNORMAL LOW (ref 1.7–2.4)

## 2015-12-23 MED ORDER — SODIUM CHLORIDE 0.9 % IV BOLUS (SEPSIS)
250.0000 mL | Freq: Once | INTRAVENOUS | Status: AC
  Administered 2015-12-23: 250 mL via INTRAVENOUS

## 2015-12-23 MED ORDER — ATROPINE SULFATE 1 MG/10ML IJ SOSY
0.5000 mg | PREFILLED_SYRINGE | Freq: Once | INTRAMUSCULAR | Status: AC
  Administered 2015-12-22: 0.5 mg via INTRAVENOUS

## 2015-12-23 MED ORDER — ACETYLCYSTEINE 20 % IN SOLN
RESPIRATORY_TRACT | Status: AC
  Filled 2015-12-23: qty 4

## 2015-12-23 MED ORDER — MAGNESIUM SULFATE 2 GM/50ML IV SOLN
2.0000 g | Freq: Once | INTRAVENOUS | Status: AC
  Administered 2015-12-23: 2 g via INTRAVENOUS
  Filled 2015-12-23: qty 50

## 2015-12-23 MED ORDER — DOPAMINE-DEXTROSE 3.2-5 MG/ML-% IV SOLN
0.0000 ug/kg/min | INTRAVENOUS | Status: DC
  Administered 2015-12-23: 5 ug/kg/min via INTRAVENOUS
  Administered 2015-12-24: 10 ug/kg/min via INTRAVENOUS
  Filled 2015-12-23: qty 250

## 2015-12-23 MED ORDER — MORPHINE SULFATE (PF) 2 MG/ML IV SOLN
1.0000 mg | Freq: Three times a day (TID) | INTRAVENOUS | Status: DC | PRN
  Administered 2015-12-23 (×2): 1 mg via INTRAVENOUS
  Filled 2015-12-23 (×3): qty 1

## 2015-12-23 MED ORDER — DOPAMINE-DEXTROSE 3.2-5 MG/ML-% IV SOLN
INTRAVENOUS | Status: AC
  Administered 2015-12-23: 5 ug/kg/min via INTRAVENOUS
  Filled 2015-12-23: qty 250

## 2015-12-23 MED ORDER — CLINDAMYCIN PHOSPHATE 300 MG/50ML IV SOLN
300.0000 mg | Freq: Three times a day (TID) | INTRAVENOUS | Status: DC
  Administered 2015-12-23 – 2015-12-25 (×6): 300 mg via INTRAVENOUS
  Filled 2015-12-23 (×7): qty 50

## 2015-12-23 MED ORDER — HEPARIN (PORCINE) IN NACL 100-0.45 UNIT/ML-% IJ SOLN
850.0000 [IU]/h | INTRAMUSCULAR | Status: AC
  Administered 2015-12-23: 950 [IU]/h via INTRAVENOUS
  Filled 2015-12-23: qty 250

## 2015-12-23 MED ORDER — ACETYLCYSTEINE 20 % IN SOLN: 4.0000 mL | Freq: Three times a day (TID) | RESPIRATORY_TRACT | Status: DC

## 2015-12-23 MED ORDER — MORPHINE SULFATE (PF) 2 MG/ML IV SOLN
1.0000 mg | INTRAVENOUS | Status: DC | PRN
  Administered 2015-12-23 – 2015-12-25 (×3): 1 mg via INTRAVENOUS
  Filled 2015-12-23 (×2): qty 1

## 2015-12-23 MED ORDER — ORAL CARE MOUTH RINSE
15.0000 mL | Freq: Two times a day (BID) | OROMUCOSAL | Status: DC
  Administered 2015-12-23 – 2015-12-27 (×9): 15 mL via OROMUCOSAL

## 2015-12-23 MED ORDER — LEVALBUTEROL HCL 0.63 MG/3ML IN NEBU
0.6300 mg | INHALATION_SOLUTION | Freq: Four times a day (QID) | RESPIRATORY_TRACT | Status: DC | PRN
  Administered 2015-12-23: 0.63 mg via RESPIRATORY_TRACT
  Filled 2015-12-23: qty 3

## 2015-12-23 MED ORDER — ACETYLCYSTEINE 20 % IN SOLN
4.0000 mL | Freq: Three times a day (TID) | RESPIRATORY_TRACT | Status: DC
  Administered 2015-12-23 – 2015-12-24 (×3): 4 mL via RESPIRATORY_TRACT
  Filled 2015-12-23 (×2): qty 4

## 2015-12-23 NOTE — Evaluation (Signed)
Clinical/Bedside Swallow Evaluation Patient Details  Name: Gabrielle PalauVirginia W Bhattacharyya MRN: [id number] Date of Birth: 09-Sep-1945  Today's Date: 12/23/2015 Time: SLP Start Time (ACUTE ONLY): 1000 SLP Stop Time (ACUTE ONLY): 1014 SLP Time Calculation (min) (ACUTE ONLY): 14 min  Past Medical History:  Past Medical History:  Diagnosis Date  . Anemia   . Arthritis   . Asthma   . Atrial fibrillation with controlled ventricular response (HCC) 11/2009   a. not on anticoagulation due to recurrent GI bleeding while on Eliquis.  . Chronic bronchitis   . Chronic diastolic CHF (congestive heart failure) (HCC)    a. Echo 12/2015: EF 55-60% w/ Grade 3 DD  . Chronic headache   . COPD (chronic obstructive pulmonary disease) (HCC)   . Depression   . Heart attack   . Hyperlipidemia   . Hypertension    Past Surgical History:  Past Surgical History:  Procedure Laterality Date  . CAD-CABG     x7, brief post-op Atrial Fib  . CESAREAN SECTION     x2  . COLONOSCOPY WITH PROPOFOL N/A 10/24/2015   Procedure: COLONOSCOPY WITH PROPOFOL;  Surgeon: Carman ChingJames Edwards, MD;  Location: [facility];  Service: Endoscopy;  Laterality: N/A;  . CORONARY ARTERY BYPASS GRAFT  2011  . ESOPHAGOGASTRODUODENOSCOPY N/A 10/22/2015   Procedure: ESOPHAGOGASTRODUODENOSCOPY (EGD);  Surgeon: Carman ChingJames Edwards, MD;  [facility];  Service: Endoscopy;  Laterality: N/A;  . JOINT REPLACEMENT Bilateral   . TONSILLECTOMY    . TOTAL KNEE ARTHROPLASTY     HPI:  70 y.o.femalewith CAD status post CABG, paroxysmal atrial fibrillation, chronic systolic heart failure, COPD, hypertension, hyperlipidemia was referred to the ER by patient's PCP because of shortness of breath.  Admitted with dx of acute resp failure with hypoxia.  Rapid Response called 10/23 due to episode of bradycardia; transferred to ICU.   Pt reports 2-3 day hx of globus, regurgitation of solids.    Assessment / Plan / Recommendation Clinical Impression  Pt presents with  s/s of esophageal dysfunction.  Eating breakfast upon entering room.  C/O "choking" with meals the last several days, but when asked for clarification what she described was related to solid foods lodging near sternum, not interference with breathing.  During assessment, she began to regurgitate breakfast meal.  There were no overt s/s of an oropharyngeal dysphagia; no signs of aspiration during the swallow.  If symptoms persist, consider further esophageal w/u to r/o motility deficit vs stricture.  Pt is quite uncomfortable and can not successfully eat. SLP will follow for progress/results and determine if further f/u is necessary.  D/W RN, family.     Aspiration Risk       Diet Recommendation   Continue PO diet as able; pt has fluid restrictions Medication Administration: Whole meds with liquid    Other  Recommendations Recommended Consults: Consider esophageal assessment Oral Care Recommendations: Oral care BID   Follow up Recommendations        Frequency and Duration            Prognosis        Swallow Study   General Date of Onset: 12/20/15 HPI: 70 y.o.femalewith CAD status post CABG, paroxysmal atrial fibrillation, chronic systolic heart failure, COPD, hypertension, hyperlipidemia was referred to the ER by patient's PCP because of shortness of breath.  Admitted with dx of acute resp failure with hypoxia.  Rapid Response called 10/23 due to episode of bradycardia; transferred to ICU.   Pt reports 2-3 day hx of globus, regurgitation  of solids.  Type of Study: Bedside Swallow Evaluation Previous Swallow Assessment: no Diet Prior to this Study: Regular;Thin liquids Temperature Spikes Noted: No Respiratory Status: Nasal cannula History of Recent Intubation: No Behavior/Cognition: Alert;Cooperative Oral Cavity Assessment: Within Functional Limits Oral Care Completed by SLP: No Oral Cavity - Dentition: Dentures, top Vision: Functional for self-feeding Self-Feeding Abilities:  Able to feed self Patient Positioning: Upright in bed Baseline Vocal Quality: Normal Volitional Cough: Strong Volitional Swallow: Able to elicit    Oral/Motor/Sensory Function Overall Oral Motor/Sensory Function: Within functional limits   Ice Chips Ice chips: Not tested   Thin Liquid Thin Liquid: Impaired Presentation: Cup;Straw Other Comments: regurgitation    Nectar Thick Nectar Thick Liquid: Not tested   Honey Thick Honey Thick Liquid: Not tested   Puree Puree: Not tested   Solid   GO   Solid: Not tested        Blenda Mounts Laurice 12/23/2015,10:29 AM

## 2015-12-23 NOTE — Progress Notes (Signed)
Rapid Response assessed patient. Pupils were found to be different sizes.Left was 751mm,Right Eye was 3-4 mm.

## 2015-12-23 NOTE — Significant Event (Signed)
Rapid Response Event Note  Overview: Time Called: 0310 Event Type: Hypotension, Other (Comment) (hypotension and bradycardia)  Initial Focused Assessment: On arrival to room 1440, pt was in bed. She opened her eyes to her name, she was drowsy but oriented x 4, able to move all extremities. Lung sounds clear bilaterally.   Interventions: 250 ml normal saline bolus administered. Patient was already on 2L oxygen via [place]. Dr Mayford Knifeurner with cardiology paged, order for 0.5mg  Atropine received and administered. Order was also received to transfer her to stepdown unit. Patient was given another 250 bolus en route to @ west. Plan of Care (if not transferred):  Event Summary: Name of Physician Notified: Tama GanderKatherine Schorr, NP at [phone]  Name of Consulting Physician Notified: Armanda Magicraci Turner, MD at [phone]  Outcome: Transferred (Comment) (transfered to stepdown unit)  Event End Time: 0400  Gabrielle [name], Lilia ProQueeneth Chibuzo

## 2015-12-23 NOTE — Progress Notes (Signed)
Patient's eyes were re-assessed by Rapid Response and the pupils were both equal in size and diameter.

## 2015-12-23 NOTE — Consult Note (Signed)
Name: Gabrielle PalauVirginia W [name] MRN: [id number] DOB: [birth year]    ADMISSION DATE:  12/16/2015 CONSULTATION DATE:  12/23/15  REFERRING MD :  Dr. Roda ShuttersXu  CHIEF COMPLAINT:  Pneumonia, difficulty swallowing > ? aspiration   HISTORY OF PRESENT ILLNESS:  [age] y/o F, smoker, with PMH of anemia, arthritis, depression, CAD s/p CABG, HTN, HLD, paroxysmal AF, chronic dCHF, MI, recurrent GIB, CKD III and GOLD C COPD (previously followed by Dr. Delford FieldWright, last seen in 2015) who [facility] on 10/16 with a four day history of shortness of breath, generalized weakness and falls.    The patient was recently admitted for a second time 8/27-8/30 for GIB while on Eliquis.  She required colonoscopy & PRBC transfusion for Hgb of 4.  She followed up with Dr. Anne FuSkains on 9/12 and at that time, she was instructed to stop taking anticoagulation (indefinitly).  Family reports the patient "decides" on a daily basis what pills she needs. However, she is currently on Eliquis as of 10/20 (after discussion with Dr. Anne FuSkains and GI, pt elected to go back on medication).    She was seen at her PCP's office on 10/16 and found to have saturations in the low 80's (82%) on room air.  Per her family, she had been experiencing hallucinations (people and chickens that were not there). She was referred to the ER for evaluation. At that time, she reported a fall without LOC.  Initial evaluation was concerning for a CHF exacerbation (BNP 751) and possible PNA.  She was treated with doxycycline due to prolonged Qt concerns, nebulized bronchodilators and O2. Urine strep antigen negative. The patient was also diuresed and responded well.  Hospital course complicated by AF with RVR.  Cardiology was consulted - pt required cardizem gtt which was later d/c'd due to brady & hypotension.  She was treated with atropine & dopamine and transferred to ICU 10/22 late pm.  Troponin negative.  The patient also reported increased cough and a CT of the chest was obtained  10/22 which showed new complete occlusion of the RML with complete atelectasis, increased LLL atelectasis.  She reports she has had difficulty swallowing.  Endoscopy was normal in August of this year.  On 10/23, she had an esophogram / barium swallow which showed near complete obstruction of the distal esophagus (worrisome for fecal impaction).    Currently the patient reports the last time she swallowed normally was Thursday of last week.  She has felt as though food gets "hung up" and points to the middle of her chest for location.  Denies chest pain, fevers, chills, overt periods of aspiration.  She does report she got choked on chicken salad on 10/22 but is unsure if she aspirated any material.    PCCM consulted for evaluation of RML PNA.    PAST MEDICAL HISTORY :   has a past medical history of Anemia; Arthritis; Asthma; Atrial fibrillation with controlled ventricular response (HCC) (11/2009); Chronic bronchitis; Chronic diastolic CHF (congestive heart failure) (HCC); Chronic headache; COPD (chronic obstructive pulmonary disease) (HCC); Depression; Heart attack; Hyperlipidemia; and Hypertension.  has a past surgical history that includes Cesarean section; Total knee arthroplasty; Coronary artery bypass graft (2011); CAD-CABG; Joint replacement (Bilateral); Tonsillectomy; Esophagogastroduodenoscopy (N/A, 10/22/2015); and Colonoscopy with propofol (N/A, 10/24/2015).   Prior to Admission medications   Medication Sig Start Date End Date Taking? Authorizing Provider  albuterol (PROAIR HFA) 108 (90 BASE) MCG/ACT inhaler INHALE 2 PUFFS INTO THE LUNGS EVERY 6 (SIX) HOURS AS NEEDED. Patient taking  differently: Inhale 2 puffs into the lungs every 4 (four) hours as needed for wheezing or shortness of breath. INHALE 2 PUFFS INTO THE LUNGS EVERY 6 (SIX) HOURS AS NEEDED. 01/15/14  Yes Storm Frisk, MD  ALPRAZolam Prudy Feeler) 0.5 MG tablet Take 1 tablet (0.5 mg total) by mouth 2 (two) times daily as needed for  anxiety. 10/30/15  Yes Clanford Cyndie Mull, MD  atorvastatin (LIPITOR) 20 MG tablet TAKE 1 TABLET (20 MG TOTAL) BY MOUTH DAILY. Patient taking differently: Take 20 mg by mouth every evening.  11/01/15  Yes Jake Bathe, MD  budesonide-formoterol [facility]) 160-4.5 MCG/ACT inhaler Inhale 2 puffs into the lungs 2 (two) times daily. 01/15/14  Yes Storm Frisk, MD  citalopram (CELEXA) 20 MG tablet Take 20 mg by mouth 2 (two) times daily.  11/28/15  Yes Historical Provider, MD  clindamycin (CLEOCIN) 300 MG capsule Take 300 mg by mouth 2 (two) times daily. 12/10/15  Yes Historical Provider, MD  clobetasol cream (TEMOVATE) 0.05 % Apply 1 application topically daily as needed (itching).    Yes Historical Provider, MD  docusate sodium (COLACE) 100 MG capsule Take 100 mg by mouth daily as needed for moderate constipation.    Yes Historical Provider, MD  furosemide (LASIX) 20 MG tablet Take 1 tablet (20 mg total) by mouth daily. May take additional 20 mg a day for swelling as needed Patient taking differently: Take 20 mg by mouth daily. May take additional 20 mg a day for as needed for swelling. 09/26/15  Yes Jake Bathe, MD  losartan (COZAAR) 50 MG tablet TAKE 1 TABLET BY MOUTH EVERY DAY Patient taking differently: TAKE 50mg  TABLET BY MOUTH ONCE DAILY 11/11/15  Yes Jake Bathe, MD  oxymetazoline (AFRIN) 0.05 % nasal spray Place 2 sprays into both nostrils 2 (two) times daily as needed for congestion.    Yes Historical Provider, MD  pantoprazole (PROTONIX) 40 MG tablet Take 40 mg by mouth daily before breakfast.  10/10/14  Yes Historical Provider, MD  Polyethyl Glycol-Propyl Glycol (SYSTANE ULTRA) 0.4-0.3 % SOLN Place 1 drop into both eyes daily as needed (for dry eyes).    Yes Historical Provider, MD  polyethylene glycol (MIRALAX / GLYCOLAX) packet Take 17 g by mouth daily as needed for mild constipation. 10/30/15  Yes Clanford Cyndie Mull, MD  sotalol (BETAPACE) 80 MG tablet Take 1 tablet (80 mg total) by  mouth daily. 11/27/15  Yes Jake Bathe, MD  tiZANidine (ZANAFLEX) 4 MG tablet Take 4 mg by mouth at bedtime as needed for muscle spasms.   Yes Historical Provider, MD  traMADol (ULTRAM) 50 MG tablet Take 1 tablet (50 mg total) by mouth every 6 (six) hours as needed for moderate pain. 10/30/15  Yes Clanford Cyndie Mull, MD  fluticasone (FLONASE) 50 MCG/ACT nasal spray Place 2 sprays into both nostrils daily. 12/21/15   Albertine Grates, MD  guaiFENesin (MUCINEX) 600 MG 12 hr tablet Take 1 tablet (600 mg total) by mouth 2 (two) times daily. 12/20/15   Albertine Grates, MD   Allergies  Allergen Reactions  . Novocain [Procaine] Hives and Other (See Comments)    palpitations  . Penicillins Hives    Has patient had a PCN reaction causing immediate rash, facial/tongue/throat swelling, SOB or lightheadedness with hypotension: Yes Has patient had a PCN reaction causing severe rash involving mucus membranes or skin necrosis: No Has patient had a PCN reaction that required hospitalization No Has patient had a PCN reaction occurring within the  last 10 years: No If all of the above answers are "NO", then may proceed with Cephalosporin use.    FAMILY HISTORY:  family history includes Diabetes in her mother; Heart attack in her father; Heart disease in her father and mother; Hypertension in her daughter, father, and mother; Lung cancer in her maternal grandfather and paternal grandfather; Stroke in her paternal grandmother.   SOCIAL HISTORY:  reports that she has quit smoking. Her smoking use included Cigarettes. She smoked 0.20 packs per day. She has never used smokeless tobacco. She reports that she does not drink alcohol or use drugs.  REVIEW OF SYSTEMS:  POSITIVES IN BOLD Constitutional: Negative for fever, chills, weight loss, malaise/fatigue and diaphoresis.  HENT: Negative for hearing loss, ear pain, nosebleeds, congestion, sore throat, neck pain, tinnitus and ear discharge.   Eyes: Negative for blurred vision,  double vision, photophobia, pain, discharge and redness.  Respiratory: Negative for cough, hemoptysis, sputum production, shortness of breath, wheezing and stridor.   Cardiovascular: Negative for chest pain, palpitations, orthopnea, claudication, leg swelling and PND.  Gastrointestinal: Negative for heartburn, nausea, vomiting, abdominal pain, diarrhea, constipation, blood in stool and melena.  Feeling like food gets "hung up", difficulty swallowing.  Genitourinary: Negative for dysuria, urgency, frequency, hematuria and flank pain.  Musculoskeletal: Negative for myalgias, back pain, joint pain and falls.  Skin: Negative for itching and rash.  Neurological: Negative for dizziness, tingling, tremors, sensory change, speech change, focal weakness, seizures, loss of consciousness, weakness and headaches.  Endo/Heme/Allergies: Negative for environmental allergies and polydipsia. Does not bruise/bleed easily.  SUBJECTIVE:   VITAL SIGNS: Temp:  [97.5 F (36.4 C)-98.3 F (36.8 C)] 97.5 F (36.4 C) (10/23 0800) Pulse Rate:  [39-129] 76 (10/23 0800) Resp:  [9-25] 12 (10/23 0945) BP: (56-181)/(21-134) 137/48 (10/23 0945) SpO2:  [91 %-100 %] 93 % (10/23 0945) Weight:  [168 lb 10.4 oz (76.5 kg)] 168 lb 10.4 oz (76.5 kg) (10/23 0435)  PHYSICAL EXAMINATION: General:  Chronically ill appearing female in NAD  Neuro:  AAOx4, speech clear, MAE  HEENT:  MM pink/moist, no jvd Cardiovascular:  s1s2 rrr, systolic murmur noted at LSB Lungs:  Even/non-labored on [place] O2 Abdomen:  Obese/soft, bsx4 active  Musculoskeletal:  No acute deformities  Skin:  Warm/dry, no edema    Recent Labs Lab 12/21/15 0533 12/22/15 0531 12/23/15 0601  NA 141 139 139  K 4.1 4.4 4.5  CL 99* 100* 103  CO2 34* 33* 30  BUN 17 17 21*  CREATININE 1.06* 0.95 1.21*  GLUCOSE 103* 123* 134*    Recent Labs Lab 12/21/15 0533 12/22/15 0531 12/23/15 0601  HGB 10.8* 10.9* 11.2*  HCT 33.3* 32.9* 35.7*  WBC 8.2 11.9* 11.7*    PLT 118* 125* 125*   Dg Chest 2 View  Result Date: 12/21/2015 CLINICAL DATA:  New onset productive cough this morning with shortness-of-breath and weakness. Mild hypoxia. EXAM: CHEST  2 VIEW COMPARISON:  12/16/2015, 11/27/2009 and chest CT 12/18/2015 FINDINGS: Median sternotomy wires are unchanged. Lungs are adequately inflated with minimal linear scarring over the left midlung and left base unchanged. Hazy opacification over the right middle lobe without significant change. Mild stable cardiomegaly. Calcified plaque is present over the aortic arch. There are degenerative changes of the spine. IMPRESSION: Hazy opacification over the right middle lobe unchanged and may be due to atelectasis or infection. Mild stable cardiomegaly. Aortic atherosclerosis. Electronically Signed   By: Elberta Fortis M.D.   On: 12/21/2015 16:00   Ct Chest Wo Contrast  Result Date: 12/22/2015 CLINICAL DATA:  Cough.  Shortness of breath. EXAM: CT CHEST WITHOUT CONTRAST TECHNIQUE: Multidetector CT imaging of the chest was performed following the standard protocol without IV contrast. COMPARISON:  12/21/2015 radiograph and 12/18/2015 chest CT FINDINGS: Cardiovascular: Coronary, aortic arch, and branch vessel atherosclerotic vascular disease. Moderate cardiomegaly. Prior CABG. Prominent main pulmonary artery. Mediastinum/Nodes: Small mediastinal lymph nodes are not pathologically enlarged by size criteria. Lungs/Pleura: Compared 4 days ago, the is a new 4 mm right upper lobe nodule on image 37 of series 4 which is likely inflammatory given that it was not present previously. There is some scattered atelectasis in the right upper lobe along with some notable bilateral airway thickening. There is new complete atelectasis of the right middle lobe with new filling or occlusion of the right middle lobe bronchus. There is occlusion of segmental bronchi in the right lower lobe with infrahilar atelectasis and bandlike densities. Similar  scarring or atelectasis in the left upper lobe compared to the prior exam. Mildly increased atelectasis in the left lower lobe. Upper Abdomen: New mild perihepatic ascites. Musculoskeletal: Unremarkable IMPRESSION: 1. Worsening bilateral airway thickening, with new complete occlusion of the right middle lobe bronchus and complete atelectasis of the right middle lobe, and segmental bronchi inclusion in the right lower lobe with increasing right infrahilar atelectasis. There is also mildly increased left lower lobe atelectasis. 2. New mild perihepatic ascites, cause uncertain. 3. Coronary, aortic arch, and branch vessel atherosclerotic vascular disease. Moderate cardiomegaly. Prominent main pulmonary artery suggesting pulmonary arterial hypertension. Prior CABG. Electronically Signed   By: Gaylyn Rong M.D.   On: 12/22/2015 19:14   SIGNIFICANT EVENTS  10/16  Admit with hypoxic respiratory failure, decompensated CHF    STUDIES:  CT Chest 10/22 >> worsening bilateral airway thickening, new complete occlusion of RML and complete atelectasis of the RML, increased infrahilar atelectasis  ECHO 10/17 >> LV moderately dilated, LVEF 55-60%, no RWMA, grade III diastolic dysfunction, LA severely dilated, trivial pulmonic regurgitation, PA peak 57 (RV systolic pressure increased c/w moderate PH) Esophogram / barium swallow 10/23 >> near complete obstruction of the distal esophagus (worrisome for fecal impaction).   ASSESSMENT / PLAN:  RML Collapse / Complete Atelectasis - possible mucus plugging vs aspirated food material - mucomyst TID with albuterol  - flutter valve - pulmonary hygiene as able - intermittent CXR  - no role for FOB at this time, if RML does not clear will consider inspection with FOB  - clinda / aztreonam per primary.  Consider adjustment to monotherapy levaquin vs cephalosporin trial for aspiration coverage   COPD Tobacco Abuse  - smoking cessation counseling  - continue xopenex  TID  - continue Dulera  Acute Hypoxic Respiratory Failure secondary to RML Collapse & underlying COPD Moderate PH - likely secondary  - O2 as needed to support sats > 90% - will need ambulatory O2 needs assessment prior to discharge   Decompensated CHF  - lasix per Cardiology  Esophageal Impaction  -Pending EGD per GI in am  -hold eliquis   Canary Brim, NP-C [facility] Pgr: [phone] or if no answer [phone] 12/23/2015, 10:43 AM

## 2015-12-23 NOTE — Progress Notes (Signed)
PT Cancellation Note  Patient Details Name: Gabrielle PalauVirginia W [name] MRN: [id number] DOB: 01/02/1946   Cancelled Treatment:     Per chart review noted rapid response call last night. Pt. Transferred to ICU. Awaiting pulmonary consult.   Mirinda Monte, SPTA 12/23/2015, 9:00 AM

## 2015-12-23 NOTE — Progress Notes (Signed)
Cardizem Infusion was stopped.

## 2015-12-23 NOTE — Progress Notes (Signed)
eLink Physician-Brief Progress Note Patient Name: Gabrielle PalauVirginia W [name] DOB: 1946-02-25 MRN: [id number]   Date of Service  12/23/2015  HPI/Events of Note  Pain - Related to fall.   eICU Interventions  Will increase Morphine 1 mg IV to Q 4 hours PRN severe pain.      Intervention Category Intermediate Interventions: Pain - evaluation and management  Ione [place] 12/23/2015, 10:18 PM

## 2015-12-23 NOTE — Progress Notes (Signed)
Called by rapid response - patient converted to sinus bradycardia at 38bpm that remained sustained with SBP .  Cardizem stopped.  HR persistent in low 40's with SBP .  Given 250cc bolus of IVF.  Patient asymptomatic but drowsy.  Given Atropine 0.5mg  IV.  HR now in 50's.  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

## 2015-12-23 NOTE — Progress Notes (Signed)
Patient's HR 30s-40s;B/P 62/34;RR15;95% 2.5 L/Min Oxygen. PCP on call was notified. Rapid Response was notified.

## 2015-12-23 NOTE — Progress Notes (Signed)
ANTICOAGULATION CONSULT NOTE - Initial Consult  Pharmacy Consult for Heparin Indication: Afib, Eliquis is held  Allergies  Allergen Reactions  . Novocain [Procaine] Hives and Other (See Comments)    palpitations  . Penicillins Hives    Has patient had a PCN reaction causing immediate rash, facial/tongue/throat swelling, SOB or lightheadedness with hypotension: Yes Has patient had a PCN reaction causing severe rash involving mucus membranes or skin necrosis: No Has patient had a PCN reaction that required hospitalization No Has patient had a PCN reaction occurring within the last 10 years: No If all of the above answers are "NO", then may proceed with Cephalosporin use.    Patient Measurements: Height: 5\' 2"  (157.5 cm) Weight: 168 lb 10.4 oz (76.5 kg) IBW/kg (Calculated) : 50.1 Heparin Dosing Weight: 67 kg  Vital Signs: Temp: 97.5 F (36.4 C) (10/23 0800) Temp Source: Oral (10/23 0800) BP: 130/37 (10/23 1400) Pulse Rate: 76 (10/23 0800)  Labs:  Recent Labs  12/21/15 0533 12/21/15 1123 12/21/15 1755 12/22/15 0005 12/22/15 0531 12/23/15 0601  HGB 10.8*  --   --   --  10.9* 11.2*  HCT 33.3*  --   --   --  32.9* 35.7*  PLT 118*  --   --   --  125* 125*  CREATININE 1.06*  --   --   --  0.95 1.21*  TROPONINI  --  <0.03 <0.03 <0.03  --   --     Estimated Creatinine Clearance: 40.3 mL/min (by C-G formula based on SCr of 1.21 mg/dL (H)).   Medical History: Past Medical History:  Diagnosis Date  . Anemia   . Arthritis   . Asthma   . Atrial fibrillation with controlled ventricular response (HCC) 11/2009   a. not on anticoagulation due to recurrent GI bleeding while on Eliquis.  . Chronic bronchitis   . Chronic diastolic CHF (congestive heart failure) (HCC)    a. Echo 12/2015: EF 55-60% w/ Grade 3 DD  . Chronic headache   . COPD (chronic obstructive pulmonary disease) (HCC)   . Depression   . Heart attack   . Hyperlipidemia   . Hypertension     Medications:   Scheduled:  . acetylcysteine  4 mL Nebulization TID  . atorvastatin  20 mg Oral QPM  . atropine  0.5 mg Intravenous Once  . aztreonam  1 g Intravenous Q8H  . ciprofloxacin-dexamethasone  4 drop Both Ears BID  . clindamycin (CLEOCIN) IV  300 mg Intravenous Q8H  . fluticasone  2 spray Each Nare BID  . furosemide  20 mg Oral Daily  . guaiFENesin  600 mg Oral BID  . levalbuterol  0.63 mg Nebulization TID  . losartan  25 mg Oral Daily  . mouth rinse  15 mL Mouth Rinse BID  . mometasone-formoterol  2 puff Inhalation BID  . pantoprazole  40 mg Oral QAC breakfast  . polyethylene glycol  17 g Oral Daily  . senna-docusate  1 tablet Oral BID  . sodium chloride flush  3 mL Intravenous Q12H   Infusions:  . DOPamine 10 mcg/kg/min (12/23/15 1248)    Assessment: Gabrielle PalauVirginia W [name] is a 70 y.o. female admitted on 12/16/2015 with ADHF, possible COPD exacerbation, or pneumonia.  During diuresis, she developed Afib with RVR and was started on Eliquis anticoagulation on 10/20.  She has NOT been on chronic anticoagulation PTA due to history of GIB.  During her swallow eval on 10/23 she vomited and has been made NPO.  Pharmacy is now consulted to dose Heparin IV while Eliquis PO is held.  Most recent Eliquis dose charted on 10/22 at 2250.  Morning dose on 10/23 was NOT given d/t vomiting. Begin heparin gtt > 12 hrs after last Eliquis dose.  Today, 12/23/2015:  SCr increased to 1.2, CrCl ~ 40 ml/min  CBC: Hgb 11.2, Plt 125  No bleeding or complications reported.    Goal of Therapy:  Heparin level 0.3-0.7 units/ml aPTT 66-102 seconds Monitor platelets by anticoagulation protocol: Yes   Plan:   No heparin bolus d/t recent Eliquis use  Start heparin IV infusion at 950 units/hr  Heparin level and APTT 8 hours after starting heparin  Continue to check APTT with each HL until effects of Eliquis on heparin level diminish.  Daily heparin level and CBC  Continue to monitor H&H and  platelets   Lynann Beaver PharmD, BCPS Pager [phone] 12/23/2015 2:26 PM

## 2015-12-23 NOTE — Progress Notes (Addendum)
Shift event note:  Notified by RN regarding pt's HR sustaining in the 40's w/ drops into the 30's. She had been on Cardizen qtt for A-Fib which has been turned off.  She is also quite hypotensive (SBP in 60-70's) but otherwise appears asymptomatic. Requested 250 cc NS bolus to be started and for cardiology to be notified of pt's change in status.  At bedside pt noted resting in NAD. She arouses easily, though appears somewhat lethargic,  and can answer simple questions. Denies CP. No respiratory distress. HR sustaining in the 40's, BP currently 69/39. RR RN contacted cardiology and received orders from Dr Mayford Knifeurner to give Atropine 0.5 mg IV. 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  Assessment/Plan: 1.  Bradycardia: Dr Mayford Knifeurner has requested pt be transferred to ICU/SDU and be kept NPO tonight for possible pacemaker placement in am. Will transfer pt to SDU and continue to monitor closely.  Leanne ChangKatherine P. Olla Delancey, NP-C [facility] Pager [phone]

## 2015-12-23 NOTE — Consult Note (Signed)
[facility] CONSULT Reason for consult: Abnormal Barium Swallow Referring Physician: [facility]. PCP: Dr. Jillyn Hidden. Primary G.I.: [facility]  Gabrielle [name] is an 70 y.o. female.  HPI: she has he history of atrial fibrillation and has been on Eliquis for some time. We saw her 8/17 due to low-grade G.I. bleeding. She had previous colonoscopies in the past by Dr. Ewing Schlein. Her Eliquis was placed on hold and she had EGD colonoscopy 8/17. She's been on methotrexate for psoriasis, severe COPD followed by Dr. Delford Field, Robaxin for back pain along with tramadol, atrial fibrillation requiring chronic anticoagulation in history of congestive heart failure. EGD 8/17 was normal. Colonoscopy showed probable mucosa but no active bleeding and diverticulosis. The patient was admitted with shortness of breath and wheezing with chest x-ray showing congestion and was treated for a combination of COPD exacerbation, CHF and pneumonia. According to the patient and her family she has been somewhat anorexic for several months but has had no problems swallowing. Her son notes she came to his house and made a whole bowl of spaghetti without problems. The patient apparently was eating in the hospital regurgitated up some chicken became short of breath was moved to the ICU. All of this led to a barium swallow yesterday. She apparently ate a large breakfast of pancakes and eggs regurgitated some of this in the barium swallow was performed about 2 hours after that showing a lot of solid material in the esophagus. Were asked to see her regarding a possible obstructive esophagus. She has been on Eliquis until today and was made NPO after the barium swallow. A heparin drip was started. Careful discussion with the patient and family indicates no prior trouble swallowing until this hospitalization but she has had previous anorexia.  Past Medical History:  Diagnosis Date  . Anemia   . Arthritis   . Asthma   . Atrial  fibrillation with controlled ventricular response (HCC) 11/2009   a. not on anticoagulation due to recurrent GI bleeding while on Eliquis.  . Chronic bronchitis   . Chronic diastolic CHF (congestive heart failure) (HCC)    a. Echo 12/2015: EF 55-60% w/ Grade 3 DD  . Chronic headache   . COPD (chronic obstructive pulmonary disease) (HCC)   . Depression   . Heart attack   . Hyperlipidemia   . Hypertension     Past Surgical History:  Procedure Laterality Date  . CAD-CABG     x7, brief post-op Atrial Fib  . CESAREAN SECTION     x2  . COLONOSCOPY WITH PROPOFOL N/A 10/24/2015   Procedure: COLONOSCOPY WITH PROPOFOL;  Surgeon: Carman Ching, MD;  Location: [facility] ENDOSCOPY;  Service: Endoscopy;  Laterality: N/A;  . CORONARY ARTERY BYPASS GRAFT  2011  . ESOPHAGOGASTRODUODENOSCOPY N/A 10/22/2015   Procedure: ESOPHAGOGASTRODUODENOSCOPY (EGD);  Surgeon: Carman Ching, MD;  Location[facility] ENDOSCOPY;  Service: Endoscopy;  Laterality: N/A;  . JOINT REPLACEMENT Bilateral   . TONSILLECTOMY    . TOTAL KNEE ARTHROPLASTY      Family History  Problem Relation Age of Onset  . Diabetes Mother   . Hypertension Mother   . Heart disease Mother     before age 79  . Heart disease Father     before age 29  . Hypertension Father   . Heart attack Father   . Lung cancer Maternal Grandfather   . Lung cancer Paternal Grandfather   . Hypertension Daughter   . Stroke Paternal Grandmother     Social History:  reports  that she has quit smoking. Her smoking use included Cigarettes. She smoked 0.20 packs per day. She has never used smokeless tobacco. She reports that she does not drink alcohol or use drugs.  Allergies:  Allergies  Allergen Reactions  . Novocain [Procaine] Hives and Other (See Comments)    palpitations  . Penicillins Hives    Has patient had a PCN reaction causing immediate rash, facial/tongue/throat swelling, SOB or lightheadedness with hypotension: Yes Has patient had a PCN reaction causing  severe rash involving mucus membranes or skin necrosis: No Has patient had a PCN reaction that required hospitalization No Has patient had a PCN reaction occurring within the last 10 years: No If all of the above answers are "NO", then may proceed with Cephalosporin use.    Medications; Prior to Admission medications   Medication Sig Start Date End Date Taking? Authorizing Provider  albuterol (PROAIR HFA) 108 (90 BASE) MCG/ACT inhaler INHALE 2 PUFFS INTO THE LUNGS EVERY 6 (SIX) HOURS AS NEEDED. Patient taking differently: Inhale 2 puffs into the lungs every 4 (four) hours as needed for wheezing or shortness of breath. INHALE 2 PUFFS INTO THE LUNGS EVERY 6 (SIX) HOURS AS NEEDED. 01/15/14  Yes Elsie Stain, MD  ALPRAZolam Duanne Moron) 0.5 MG tablet Take 1 tablet (0.5 mg total) by mouth 2 (two) times daily as needed for anxiety. 10/30/15  Yes Clanford Marisa Hua, MD  atorvastatin (LIPITOR) 20 MG tablet TAKE 1 TABLET (20 MG TOTAL) BY MOUTH DAILY. Patient taking differently: Take 20 mg by mouth every evening.  11/01/15  Yes Jerline Pain, MD  budesonide-formoterol [facility]) 160-4.5 MCG/ACT inhaler Inhale 2 puffs into the lungs 2 (two) times daily. 01/15/14  Yes Elsie Stain, MD  citalopram (CELEXA) 20 MG tablet Take 20 mg by mouth 2 (two) times daily.  11/28/15  Yes Historical Provider, MD  clindamycin (CLEOCIN) 300 MG capsule Take 300 mg by mouth 2 (two) times daily. 12/10/15  Yes Historical Provider, MD  clobetasol cream (TEMOVATE) 0.08 % Apply 1 application topically daily as needed (itching).    Yes Historical Provider, MD  docusate sodium (COLACE) 100 MG capsule Take 100 mg by mouth daily as needed for moderate constipation.    Yes Historical Provider, MD  furosemide (LASIX) 20 MG tablet Take 1 tablet (20 mg total) by mouth daily. May take additional 20 mg a day for swelling as needed Patient taking differently: Take 20 mg by mouth daily. May take additional 20 mg a day for as needed for  swelling. 09/26/15  Yes Jerline Pain, MD  losartan (COZAAR) 50 MG tablet TAKE 1 TABLET BY MOUTH EVERY DAY Patient taking differently: TAKE '50mg'$  TABLET BY MOUTH ONCE DAILY 11/11/15  Yes Jerline Pain, MD  oxymetazoline (AFRIN) 0.05 % nasal spray Place 2 sprays into both nostrils 2 (two) times daily as needed for congestion.    Yes Historical Provider, MD  pantoprazole (PROTONIX) 40 MG tablet Take 40 mg by mouth daily before breakfast.  10/10/14  Yes Historical Provider, MD  Polyethyl Glycol-Propyl Glycol (SYSTANE ULTRA) 0.4-0.3 % SOLN Place 1 drop into both eyes daily as needed (for dry eyes).    Yes Historical Provider, MD  polyethylene glycol (MIRALAX / GLYCOLAX) packet Take 17 g by mouth daily as needed for mild constipation. 10/30/15  Yes Clanford Marisa Hua, MD  sotalol (BETAPACE) 80 MG tablet Take 1 tablet (80 mg total) by mouth daily. 11/27/15  Yes Jerline Pain, MD  tiZANidine (ZANAFLEX) 4 MG tablet  Take 4 mg by mouth at bedtime as needed for muscle spasms.   Yes Historical Provider, MD  traMADol (ULTRAM) 50 MG tablet Take 1 tablet (50 mg total) by mouth every 6 (six) hours as needed for moderate pain. 10/30/15  Yes Clanford Marisa Hua, MD  fluticasone (FLONASE) 50 MCG/ACT nasal spray Place 2 sprays into both nostrils daily. 12/21/15   Florencia Reasons, MD  guaiFENesin (MUCINEX) 600 MG 12 hr tablet Take 1 tablet (600 mg total) by mouth 2 (two) times daily. 12/20/15   Florencia Reasons, MD   . acetylcysteine  4 mL Nebulization TID  . atorvastatin  20 mg Oral QPM  . atropine  0.5 mg Intravenous Once  . aztreonam  1 g Intravenous Q8H  . ciprofloxacin-dexamethasone  4 drop Both Ears BID  . clindamycin (CLEOCIN) IV  300 mg Intravenous Q8H  . fluticasone  2 spray Each Nare BID  . furosemide  20 mg Oral Daily  . guaiFENesin  600 mg Oral BID  . levalbuterol  0.63 mg Nebulization TID  . losartan  25 mg Oral Daily  . mouth rinse  15 mL Mouth Rinse BID  . mometasone-formoterol  2 puff Inhalation BID  . pantoprazole  40  mg Oral QAC breakfast  . polyethylene glycol  17 g Oral Daily  . senna-docusate  1 tablet Oral BID  . sodium chloride flush  3 mL Intravenous Q12H   PRN Meds acetaminophen **OR** acetaminophen, ALPRAZolam, hydrALAZINE, morphine injection, ondansetron **OR** ondansetron (ZOFRAN) IV, polyvinyl alcohol, tiZANidine, traMADol Results for orders placed or performed during the hospital encounter of 12/16/15 (from the past 48 hour(s))  Troponin I (q 6hr x 3)     Status: None   Collection Time: 12/21/15  5:55 PM  Result Value Ref Range   Troponin I <0.03 <0.03 ng/mL  Troponin I (q 6hr x 3)     Status: None   Collection Time: 12/22/15 12:05 AM  Result Value Ref Range   Troponin I <0.03 <0.03 ng/mL  CBC     Status: Abnormal   Collection Time: 12/22/15  5:31 AM  Result Value Ref Range   WBC 11.9 (H) 4.0 - 10.5 K/uL   RBC 3.43 (L) 3.87 - 5.11 MIL/uL   Hemoglobin 10.9 (L) 12.0 - 15.0 g/dL   HCT 32.9 (L) 36.0 - 46.0 %   MCV 95.9 78.0 - 100.0 fL   MCH 31.8 26.0 - 34.0 pg   MCHC 33.1 30.0 - 36.0 g/dL   RDW 18.6 (H) 11.5 - 15.5 %   Platelets 125 (L) 150 - 400 K/uL  Comprehensive metabolic panel     Status: Abnormal   Collection Time: 12/22/15  5:31 AM  Result Value Ref Range   Sodium 139 135 - 145 mmol/L   Potassium 4.4 3.5 - 5.1 mmol/L   Chloride 100 (L) 101 - 111 mmol/L   CO2 33 (H) 22 - 32 mmol/L   Glucose, Bld 123 (H) 65 - 99 mg/dL   BUN 17 6 - 20 mg/dL   Creatinine, Ser 0.95 0.44 - 1.00 mg/dL   Calcium 9.4 8.9 - 10.3 mg/dL   Total Protein 5.6 (L) 6.5 - 8.1 g/dL   Albumin 3.1 (L) 3.5 - 5.0 g/dL   AST 31 15 - 41 U/L   ALT 10 (L) 14 - 54 U/L   Alkaline Phosphatase 83 38 - 126 U/L   Total Bilirubin 1.7 (H) 0.3 - 1.2 mg/dL   GFR calc non Af Amer 58 (L) >60  mL/min   GFR calc Af Amer >60 >60 mL/min    Comment: (NOTE) The eGFR has been calculated using the CKD EPI equation. This calculation has not been validated in all clinical situations. eGFR's persistently <60 mL/min signify possible  Chronic Kidney Disease.    Anion gap 6 5 - 15  CBC     Status: Abnormal   Collection Time: 12/23/15  6:01 AM  Result Value Ref Range   WBC 11.7 (H) 4.0 - 10.5 K/uL   RBC 3.62 (L) 3.87 - 5.11 MIL/uL   Hemoglobin 11.2 (L) 12.0 - 15.0 g/dL   HCT 35.7 (L) 36.0 - 46.0 %   MCV 98.6 78.0 - 100.0 fL   MCH 30.9 26.0 - 34.0 pg   MCHC 31.4 30.0 - 36.0 g/dL   RDW 18.7 (H) 11.5 - 15.5 %   Platelets 125 (L) 150 - 400 K/uL  Comprehensive metabolic panel     Status: Abnormal   Collection Time: 12/23/15  6:01 AM  Result Value Ref Range   Sodium 139 135 - 145 mmol/L   Potassium 4.5 3.5 - 5.1 mmol/L   Chloride 103 101 - 111 mmol/L   CO2 30 22 - 32 mmol/L   Glucose, Bld 134 (H) 65 - 99 mg/dL   BUN 21 (H) 6 - 20 mg/dL   Creatinine, Ser 1.21 (H) 0.44 - 1.00 mg/dL   Calcium 9.0 8.9 - 10.3 mg/dL   Total Protein 5.7 (L) 6.5 - 8.1 g/dL   Albumin 3.2 (L) 3.5 - 5.0 g/dL   AST 32 15 - 41 U/L   ALT 11 (L) 14 - 54 U/L   Alkaline Phosphatase 80 38 - 126 U/L   Total Bilirubin 2.0 (H) 0.3 - 1.2 mg/dL   GFR calc non Af Amer 44 (L) >60 mL/min   GFR calc Af Amer 51 (L) >60 mL/min    Comment: (NOTE) The eGFR has been calculated using the CKD EPI equation. This calculation has not been validated in all clinical situations. eGFR's persistently <60 mL/min signify possible Chronic Kidney Disease.    Anion gap 6 5 - 15  Magnesium     Status: Abnormal   Collection Time: 12/23/15  6:01 AM  Result Value Ref Range   Magnesium 1.6 (L) 1.7 - 2.4 mg/dL  Urinalysis, Routine w reflex microscopic (not at [facility])     Status: Abnormal   Collection Time: 12/23/15 10:59 AM  Result Value Ref Range   Color, Urine AMBER (A) YELLOW    Comment: BIOCHEMICALS MAY BE AFFECTED BY COLOR   APPearance CLOUDY (A) CLEAR   Specific Gravity, Urine 1.017 1.005 - 1.030   pH 6.0 5.0 - 8.0   Glucose, UA NEGATIVE NEGATIVE mg/dL   Hgb urine dipstick LARGE (A) NEGATIVE   Bilirubin Urine NEGATIVE NEGATIVE   Ketones, ur NEGATIVE NEGATIVE  mg/dL   Protein, ur NEGATIVE NEGATIVE mg/dL   Nitrite NEGATIVE NEGATIVE   Leukocytes, UA SMALL (A) NEGATIVE  Urine microscopic-add on     Status: Abnormal   Collection Time: 12/23/15 10:59 AM  Result Value Ref Range   Squamous Epithelial / LPF 0-5 (A) NONE SEEN   WBC, UA 6-30 0 - 5 WBC/hpf   RBC / HPF TOO NUMEROUS TO COUNT 0 - 5 RBC/hpf   Bacteria, UA FEW (A) NONE SEEN    Dg Chest 2 View  Result Date: 12/21/2015 CLINICAL DATA:  New onset productive cough this morning with shortness-of-breath and weakness. Mild hypoxia. EXAM: CHEST  2  VIEW COMPARISON:  12/16/2015, 11/27/2009 and chest CT 12/18/2015 FINDINGS: Median sternotomy wires are unchanged. Lungs are adequately inflated with minimal linear scarring over the left midlung and left base unchanged. Hazy opacification over the right middle lobe without significant change. Mild stable cardiomegaly. Calcified plaque is present over the aortic arch. There are degenerative changes of the spine. IMPRESSION: Hazy opacification over the right middle lobe unchanged and may be due to atelectasis or infection. Mild stable cardiomegaly. Aortic atherosclerosis. Electronically Signed   By: Marin Olp M.D.   On: 12/21/2015 16:00   Ct Chest Wo Contrast  Result Date: 12/22/2015 CLINICAL DATA:  Cough.  Shortness of breath. EXAM: CT CHEST WITHOUT CONTRAST TECHNIQUE: Multidetector CT imaging of the chest was performed following the standard protocol without IV contrast. COMPARISON:  12/21/2015 radiograph and 12/18/2015 chest CT FINDINGS: Cardiovascular: Coronary, aortic arch, and branch vessel atherosclerotic vascular disease. Moderate cardiomegaly. Prior CABG. Prominent main pulmonary artery. Mediastinum/Nodes: Small mediastinal lymph nodes are not pathologically enlarged by size criteria. Lungs/Pleura: Compared 4 days ago, the is a new 4 mm right upper lobe nodule on image 37 of series 4 which is likely inflammatory given that it was not present previously.  There is some scattered atelectasis in the right upper lobe along with some notable bilateral airway thickening. There is new complete atelectasis of the right middle lobe with new filling or occlusion of the right middle lobe bronchus. There is occlusion of segmental bronchi in the right lower lobe with infrahilar atelectasis and bandlike densities. Similar scarring or atelectasis in the left upper lobe compared to the prior exam. Mildly increased atelectasis in the left lower lobe. Upper Abdomen: New mild perihepatic ascites. Musculoskeletal: Unremarkable IMPRESSION: 1. Worsening bilateral airway thickening, with new complete occlusion of the right middle lobe bronchus and complete atelectasis of the right middle lobe, and segmental bronchi inclusion in the right lower lobe with increasing right infrahilar atelectasis. There is also mildly increased left lower lobe atelectasis. 2. New mild perihepatic ascites, cause uncertain. 3. Coronary, aortic arch, and branch vessel atherosclerotic vascular disease. Moderate cardiomegaly. Prominent main pulmonary artery suggesting pulmonary arterial hypertension. Prior CABG. Electronically Signed   By: Van Clines M.D.   On: 12/22/2015 19:14   Dg Esophagus  Addendum Date: 12/23/2015   ADDENDUM REPORT: 12/23/2015 13:51 ADDENDUM: These results were called by telephone at the time of interpretation on 12/23/2015 at 11:51 am to Dr. Florencia Reasons , who verbally acknowledged these results. Electronically Signed   By: Rolm Baptise M.D.   On: 12/23/2015 13:51   Result Date: 12/23/2015 CLINICAL DATA:  Difficulty swallowing, possible aspiration. EXAM: ESOPHOGRAM/BARIUM SWALLOW TECHNIQUE: Single contrast examination was performed using  thin barium. FLUOROSCOPY TIME:  Fluoroscopy Time:  1 minutes 12 seconds Radiation Exposure Index (if provided by the fluoroscopic device): 8.5 mGy Number of Acquired Spot Images: 1 COMPARISON:  CT 12/22/2015 FINDINGS: There is near complete  obstruction of the distal esophagus with irregular filling defects. A small amount of contrast finally passes through the distal esophagus. Findings concerning for food impaction. Full column stasis of contrast above the distal obstruction with very slow emptying of the esophagus. IMPRESSION: Near complete obstruction of the distal esophagus with filling defects and a very slow trickle of contrast through the distal esophagus into the stomach. Findings concerning for fecal impaction. Cannot exclude partially obstructing underlying distal mass. Recommend further evaluation with endoscopy. Electronically Signed: By: Rolm Baptise M.D. On: 12/23/2015 12:20  Blood pressure (!) 130/37, pulse 76, temperature 97.5 F (36.4 C), temperature source Oral, resp. rate 14, height '5\' 2"'$  (1.575 m), weight 76.5 kg (168 lb 10.4 oz), SpO2 99 %.  Physical exam:   General-- pleasant white female alert no distress with no obvious difficulty breathing. ENT-- grossly normal throat with  dry mucous membranes Neck-- supple Heart-- somewhat tachycardic Lungs-- grossly clear anteriorly and posteriorly Abdomen-- soft and nontender Psych-- oriented and answers questions appropriately.   Assessment: 1. Dysphagia. The patient had not had any problems swallowing until recently. The barium swallow was obtained after eating pancakes and scrambled eggs and a lot of the material may be residual breakfast. She swallowing saliva not regurgitating anything currently. I think EGD would be appropriate but she's only been off Eliquis one day. This may all be connected with her right middle lobe collapse 2. RML collapse. Being followed by pulmonary could be result of mucus plugging or aspiration based on her dysphagia. If not improved may need bronchoscopy, it currently has a O2 sat >90% on minimal supplemental oxygen. 3. Atrial fibrillation on chronic anticoagulation 4. Recent G.I. bleed unclear source may have  been diverticular in origin 5. History of chronic congestive heart failure  Plan:  1. We will continue to hold the patient's Eliquis and would prefer that she be off of Eliquis at least 2 days prior to endoscopic evaluation. Heparin drip is started and we will hold this for 2 hours prior to endoscopy. 2. Endoscopy is planned for 9 AM tomorrow and will be performed with moderate sedation. Based on the normal esophagus on previous endoscopy suspect that the foreign body/food material will be passed. Have discussed all of the above with the patient and family.   Loyed Wilmes JR,Lc Joynt L 12/23/2015, 2:47 PM   This note was created using voice recognition software and minor errors may Have occurred unintentionally. Pager: [phone] If no answer or after hours call [phone]

## 2015-12-23 NOTE — Progress Notes (Signed)
PCP and Rapid Response were in patient's room.  Atropine 0.5 mg was given.

## 2015-12-23 NOTE — Progress Notes (Addendum)
[facility] PROGRESS NOTE    Progress Note  Gabrielle [name]  ZOX:[id number] DOB: 03/01/1946 DOA: 12/16/2015 PCP: Gabrielle Saupe, MD     Brief Narrative:   Gabrielle [name] is an 70 y.o. female past medical history of CAD status post CABG, paroxysmal atrial fibrillation, chronic systolic heart failure was referred to the ED by her PCP for shortness of breath, the patient felt 4 days prior to admission, she did not loose consciousness since the fall she's been having back pain she said her PCP on the day of admission found her to be hypoxic and send her to the ED, M.D. she was found to have bilateral wheezing and crackles chest x-ray showing possible pneumonia with vascular congestion.  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   Due to increased cough, ct chest was done on 10/22 found to have complete right middle lobe bronchus occulsion, pulmonary consulted, (patient reported chocked on a piece of chicken, but was able to cough it out prior this )  Assessment/Plan:   Acute respiratory failure with hypoxia [facility]): (  Patient presented to the hospital and admitted for sob /hypoxia and edema, likely due to acute decompensated diastolic heart failure initially. 2-D echo lvef wnl, does has diastolic dysfunction, cardiac biomarkers have been negative 2. She was treated with IV Lasix, then switched to oral lasix on 10/20, follow strict is and os, daily weights.  crackles and lower extremity edema has much improved, but not able to  wean off oxygen  need to arrange home o2  Complete right middle lobe bronchus occlusion with right middle lobe collapse from mucus plugging vs aspiration ( she reported chocked on a piece  of chicken prior to this) Currently she is on 2liter oxygen, rr 16 She is started on aztreonam and cleocin ( difficult to choose abx to cover aspiration pna due to pcn allergy and Qtc prolongation) Pulmonary consulted  Food impaction at distal esophagus STAT [facility] consult requested. Keep patient NPO.  paroxysmal Atrial fibrillation with controlled ventricular response (HCC): sinus rhythm in the hospital until 10/22, then developed tachybrady syndrome and transferred to stepdown chads 2 vasc score is more than 2 Not on anticoagulation previously due to GI bleed. Cardiology recommends to restart eliquis Cardiology consulted regarding sotalol in the setting of prolonged QT who recommended d/c sotalol Cardiology signed off and reconsulted to due afib/rvr, tachybrady and hypotension on 10/22, now on dopamine drip   Prolonged QT: QTC on presentation D/c celexa,  Keep K.4, mag >2, Cardiology consulted regarding sotalol use in the setting of prolonged QT who recommended d/c sotalol Daily ekg to monitor Qtc, improving  HTN;  bp low normal on 10/20, reduce cozaar dose,  bp elevated on 10/22, add prn hydralazine Hold  bp meds on 10/23 due to on dopamine drip  Thrombocytopenia: Unclear etiology, seems slowing improving  Normocytic anemia: Does has h/o recurrent gi bleed, no active bleed during this hospitalization anemia panel unremarkable  Right ear pain, right mastoid process tenderness Otoscope exam reveal opaque tympanic member , no fluids level, no perforation ct maxillofacial no acute finginds, started on ear drops, flonase, doxycycline ( not a candidate for quinolones due to QTc, not a candidate for augmentin due to she is allergic to pcn)  Falls/FTT/poor  memory: Not sure questionable near syncopal has anything to do with the prolonged QTC of 600. PT recommended SNF , patient declines, family requested assisted living placement, social worker consulted  Prior smoker,  quit smoking several months ago, h/o copd, no significant wheezing, does has rhonchi and crackles, continue nebs, add mucinex   10:30 am on 10/23 addendum:  Patient was not able to keep solid down, throw up while under swallow eval, will keep strict npo for now, change oral meds to iv, cardiology to decide on transition from eliquis to heparin drip, DG esophagus ordered  Status changed to icu status due to on dopamine drip.  DVT prophylaxis: was on heparin, now on eliquis Family Communication: patient Disposition Plan/Barrier to D/C: transferred to stepdown on 10/22 -10/23 night, on dopamine drip, remain in stepdown  Code Status:     Code Status Orders        Start     Ordered   12/16/15 2148  Full code  Continuous     12/16/15 2148    Code Status History    Date Active Date Inactive Code Status Order ID Comments User Context   10/27/2015  4:16 PM 10/30/2015  6:03 PM Full Code [id number]  Meredith PelPaula M Guenther, NP ED   10/21/2015  6:50 PM 10/25/2015  8:23 PM Full Code [id number]  Jeralyn BennettEzequiel Zamora, MD Inpatient        IV Access:    Peripheral IV   Procedures and diagnostic studies:   Dg Chest 2 View  Result Date: 12/21/2015 CLINICAL DATA:  New onset productive cough this morning with shortness-of-breath and weakness. Mild hypoxia. EXAM: CHEST  2 VIEW COMPARISON:  12/16/2015, 11/27/2009 and chest CT 12/18/2015 FINDINGS: Median sternotomy wires are unchanged. Lungs are adequately inflated with minimal linear scarring over the left midlung and left base unchanged. Hazy opacification over the right middle lobe without significant change. Mild stable cardiomegaly. Calcified plaque is present over the aortic arch. There are degenerative changes of the spine. IMPRESSION: Hazy opacification over the right middle lobe unchanged and may be due to atelectasis or infection. Mild stable cardiomegaly. Aortic atherosclerosis. Electronically Signed   By: Elberta Fortisaniel  Boyle M.D.   On: 12/21/2015 16:00    Ct Chest Wo Contrast  Result Date: 12/22/2015 CLINICAL DATA:  Cough.  Shortness of breath. EXAM: CT CHEST WITHOUT CONTRAST TECHNIQUE: Multidetector CT imaging of the chest was performed following the standard protocol without IV contrast. COMPARISON:  12/21/2015 radiograph and 12/18/2015 chest CT FINDINGS: Cardiovascular: Coronary, aortic arch, and branch vessel atherosclerotic vascular disease. Moderate cardiomegaly. Prior CABG. Prominent main pulmonary artery. Mediastinum/Nodes: Small mediastinal lymph nodes are not pathologically enlarged by size criteria. Lungs/Pleura: Compared 4 days ago, the is a new 4 mm right upper lobe nodule on image 37 of series 4 which is likely inflammatory given that it was not present previously. There is some scattered atelectasis in the right upper lobe along with some notable bilateral airway thickening. There is new complete atelectasis of the right middle lobe with new filling or occlusion of the right middle lobe bronchus. There is occlusion of segmental bronchi in the right lower lobe with infrahilar atelectasis and bandlike densities. Similar scarring or atelectasis in the left upper lobe compared to the prior exam. Mildly increased atelectasis in the left lower lobe. Upper Abdomen: New mild perihepatic ascites. Musculoskeletal: Unremarkable IMPRESSION: 1. Worsening bilateral airway thickening, with new complete occlusion of the right middle lobe bronchus and complete atelectasis of the right middle lobe, and segmental  bronchi inclusion in the right lower lobe with increasing right infrahilar atelectasis. There is also mildly increased left lower lobe atelectasis. 2. New mild perihepatic ascites, cause uncertain. 3. Coronary, aortic arch, and branch vessel atherosclerotic vascular disease. Moderate cardiomegaly. Prominent main pulmonary artery suggesting pulmonary arterial hypertension. Prior CABG. Electronically Signed   By: Gaylyn Rong M.D.   On: 12/22/2015  19:14     Medical Consultants:    cardiology  Anti-Infectives:   IV Rocephin  on 12/17/2015 Doxycycline started on 12/16/2015 to 10/22 Aztreonam and cleocin from 10/22  Subjective:    [name] she is transferred to stepdown due to tachy/brady syndrome, currently on dopamine drip, vital stable, not in acute distress, twin sister and son in room  Objective:    Vitals:   12/23/15 0630 12/23/15 0645 12/23/15 0700 12/23/15 0715  BP: (!) 103/36 (!) 108/42 (!) 112/37 (!) 115/39  Pulse:      Resp: 14 16 15 12   Temp:      TempSrc:      SpO2: 93%     Weight:      Height:        Intake/Output Summary (Last 24 hours) at 12/23/15 0802 Last data filed at 12/23/15 0441  Gross per 24 hour  Intake           704.42 ml  Output             1050 ml  Net          -345.58 ml   Filed Weights   12/21/15 0525 12/22/15 0649 12/23/15 0435  Weight: 78.4 kg (172 lb 12.8 oz) 76.9 kg (169 lb 8 oz) 76.5 kg (168 lb 10.4 oz)    Exam: General exam: frail, but NAD Respiratory system:  No wheezing this am, does has mild crackles at basis Cardiovascular system: S1 & S2 heard, RRR.  Gastrointestinal system: Abdomen is nondistended, soft and nontender.  Central nervous system: Alert and oriented. No focal neurological deficits. Extremities: + lower extremity pitting edema slowing improving Skin: No rashes, lesions or ulcers Psychiatry: Judgement and insight appear normal. Mood & affect appropriate.    Data Reviewed:    Labs: Basic Metabolic Panel:  Recent Labs Lab 12/18/15 0501 12/19/15 0525 12/20/15 0411 12/21/15 0533 12/22/15 0531 12/23/15 0601  NA 143 142 140 141 139 139  K 3.7 4.3 4.4 4.1 4.4 4.5  CL 103 101 101 99* 100* 103  CO2 33* 33* 34* 34* 33* 30  GLUCOSE 106* 101* 117* 103* 123* 134*  BUN 10 11 14 17 17  21*  CREATININE 0.95 0.89 1.05* 1.06* 0.95 1.21*  CALCIUM 8.8* 9.0 8.6* 9.1 9.4 9.0  MG 1.6* 1.9 2.2 1.9  --  1.6*   GFR Estimated Creatinine Clearance:  40.3 mL/min (by C-G formula based on SCr of 1.21 mg/dL (H)). Liver Function Tests:  Recent Labs Lab 12/19/15 0525 12/21/15 0533 12/22/15 0531 12/23/15 0601  AST 33 34 31 32  ALT 9* 10* 10* 11*  ALKPHOS 83 83 83 80  BILITOT 2.7* 1.7* 1.7* 2.0*  PROT 5.9* 5.4* 5.6* 5.7*  ALBUMIN 3.4* 3.0* 3.1* 3.2*   No results for input(s): LIPASE, AMYLASE in the last 168 hours. No results for input(s): AMMONIA in the last 168 hours. Coagulation profile No results for input(s): INR, PROTIME in the last 168 hours.  CBC:  Recent Labs Lab 12/17/15 0345 12/19/15 0525 12/21/15 0533 12/22/15 0531 12/23/15 0601  WBC 5.2 10.7* 8.2 11.9* 11.7*  NEUTROABS  --  6.1 5.7  --   --   HGB 10.9* 11.5* 10.8* 10.9* 11.2*  HCT 33.8* 35.9* 33.3* 32.9* 35.7*  MCV 96.3 95.7 97.1 95.9 98.6  PLT 98* 130* 118* 125* 125*   Cardiac Enzymes:  Recent Labs Lab 12/16/15 2201 12/21/15 1123 12/21/15 1755 12/22/15 0005  TROPONINI <0.03 <0.03 <0.03 <0.03   BNP (last 3 results) No results for input(s): PROBNP in the last 8760 hours. CBG:  Recent Labs Lab 12/16/15 1544 12/19/15 1707  GLUCAP 73 125*   D-Dimer: No results for input(s): DDIMER in the last 72 hours. Hgb A1c: No results for input(s): HGBA1C in the last 72 hours. Lipid Profile: No results for input(s): CHOL, HDL, LDLCALC, TRIG, CHOLHDL, LDLDIRECT in the last 72 hours. Thyroid function studies: No results for input(s): TSH, T4TOTAL, T3FREE, THYROIDAB in the last 72 hours.  Invalid input(s): FREET3 Anemia work up: No results for input(s): VITAMINB12, FOLATE, FERRITIN, TIBC, IRON, RETICCTPCT in the last 72 hours. Sepsis Labs:  Recent Labs Lab 12/19/15 0525 12/21/15 0533 12/22/15 0531 12/23/15 0601  WBC 10.7* 8.2 11.9* 11.7*   Microbiology Recent Results (from the past 240 hour(s))  MRSA PCR Screening     Status: None   Collection Time: 12/16/15  9:18 PM  Result Value Ref Range Status   MRSA by PCR NEGATIVE NEGATIVE Final     Comment:        The GeneXpert MRSA Assay (FDA approved for NASAL specimens only), is one component of a comprehensive MRSA colonization surveillance program. It is not intended to diagnose MRSA infection nor to guide or monitor treatment for MRSA infections.   Culture, sputum-assessment     Status: None   Collection Time: 12/18/15  7:03 AM  Result Value Ref Range Status   Specimen Description SPUTUM  Final   Special Requests NONE  Final   Sputum evaluation   Final    THIS SPECIMEN IS ACCEPTABLE. RESPIRATORY CULTURE REPORT TO FOLLOW.   Report Status 12/18/2015 FINAL  Final  Culture, respiratory (NON-Expectorated)     Status: None   Collection Time: 12/18/15  7:03 AM  Result Value Ref Range Status   Specimen Description SPUTUM  Final   Special Requests NONE  Final   Gram Stain   Final    MODERATE WBC PRESENT,BOTH PMN AND MONONUCLEAR FEW SQUAMOUS EPITHELIAL CELLS PRESENT ABUNDANT GRAM POSITIVE COCCI IN CLUSTERS MODERATE GRAM POSITIVE COCCI IN PAIRS FEW YEAST WITH PSEUDOHYPHAE FEW GRAM NEGATIVE COCCOBACILLI RARE GRAM NEGATIVE DIPLOCOCCI    Culture   Final    Consistent with normal respiratory flora. Performed at [facility]    Report Status 12/20/2015 FINAL  Final     Medications:   . apixaban  5 mg Oral BID  . atorvastatin  20 mg Oral QPM  . atropine  0.5 mg Intravenous Once  . aztreonam  1 g Intravenous Q8H  . ciprofloxacin-dexamethasone  4 drop Both Ears BID  . clindamycin  300 mg Oral Q8H  . fluticasone  2 spray Each Nare BID  . furosemide  20 mg Oral Daily  . guaiFENesin  600 mg Oral BID  . levalbuterol  0.63 mg Nebulization TID  . losartan  25 mg Oral Daily  . magnesium sulfate 1 - 4 g bolus IVPB  2 g Intravenous Once  . mometasone-formoterol  2 puff Inhalation BID  . pantoprazole  40 mg Oral QAC breakfast  . polyethylene glycol  17 g Oral Daily  . senna-docusate  1 tablet Oral BID  .  sodium chloride flush  3 mL Intravenous Q12H   Continuous  Infusions: . DOPamine 5 mcg/kg/min (12/23/15 0441)      LOS: 7 days    Time spent:  Addyson Traub MD PhD  [facility] Pager [phone]  *Please refer to [email or web address], password [email or web address] to get updated schedule on who will round on this patient, as hospitalists switch teams weekly. If 7PM-7AM, please contact night-coverage at [email or web address], password [email or web address] for any overnight needs.  12/23/2015, 8:02 AM

## 2015-12-23 NOTE — H&P (Deleted)
Patient's HR started to brady down to 30s-40s. Cardizem infusion was stopped. Other vitals were B/P 62/34;HR42;RR 15;95% on 2.5 L/Min. PCP on call was notified. Rapid response was notified.

## 2015-12-23 NOTE — Progress Notes (Signed)
Patient's cardiologist Dr. Mayford Knifeurner was notified by Rapid Response Nurse Merril AbbeQueeneth. The patient was still talking but stated that she was "sleepy". Orders were given to transfer patient to ICU/Stepdown.

## 2015-12-23 NOTE — Progress Notes (Signed)
DAILY PROGRESS NOTE  Subjective:  Events noted overnight- including symptomatic bradycardia with HR in the low 40's after conversion to sinus. 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 She remains on low dose dopamine- treated for possible aspiration pneumonia - there may be concern for stricture or esophageal dysmotility.  Objective:  Temp:  [97.5 F (36.4 C)-98.3 F (36.8 C)] 97.5 F (36.4 C) (10/23 0800) Pulse Rate:  [39-76] 76 (10/23 0800) Resp:  [9-25] 12 (10/23 0945) BP: (56-172)/(21-89) 137/48 (10/23 0945) SpO2:  [91 %-100 %] 93 % (10/23 0945) Weight:  [168 lb 10.4 oz (76.5 kg)] 168 lb 10.4 oz (76.5 kg) (10/23 0435) Weight change: -13.6 oz (-0.385 kg)  Intake/Output from previous day: 10/22 0701 - 10/23 0700 In: 704.4 [P.O.:420; I.V.:184.4; IV Piggyback:100] Out: 1050 [Urine:1050]  Intake/Output from this shift: Total I/O In: 24.1 [I.V.:24.1] Out: -   Medications: No current facility-administered medications on file prior to encounter.    Current Outpatient Prescriptions on File Prior to Encounter  Medication Sig Dispense Refill  . albuterol (PROAIR HFA) 108 (90 BASE) MCG/ACT inhaler INHALE 2 PUFFS INTO THE LUNGS EVERY 6 (SIX) HOURS AS NEEDED. (Patient taking differently: Inhale 2 puffs into the lungs every 4 (four) hours as needed for wheezing or shortness of breath. INHALE 2 PUFFS INTO THE LUNGS EVERY 6 (SIX) HOURS AS NEEDED.) 18 g 3  . ALPRAZolam (XANAX) 0.5 MG tablet Take 1 tablet (0.5 mg total) by mouth 2 (two) times daily as needed for anxiety. 10 tablet 0  . atorvastatin (LIPITOR) 20 MG tablet TAKE 1 TABLET (20 MG TOTAL) BY MOUTH DAILY. (Patient taking differently: Take 20 mg by mouth every evening. ) 90 tablet 2  . budesonide-formoterol (SYMBICORT) 160-4.5 MCG/ACT inhaler Inhale 2 puffs into the lungs 2 (two) times daily. 10.2 g 6  . clobetasol cream (TEMOVATE) 3.01 % Apply 1 application  topically daily as needed (itching).   3  . docusate sodium (COLACE) 100 MG capsule Take 100 mg by mouth daily as needed for moderate constipation.     . furosemide (LASIX) 20 MG tablet Take 1 tablet (20 mg total) by mouth daily. May take additional 20 mg a day for swelling as needed (Patient taking differently: Take 20 mg by mouth daily. May take additional 20 mg a day for as needed for swelling.) 180 tablet 1  . losartan (COZAAR) 50 MG tablet TAKE 1 TABLET BY MOUTH EVERY DAY (Patient taking differently: TAKE 55m TABLET BY MOUTH ONCE DAILY) 90 tablet 2  . oxymetazoline (AFRIN) 0.05 % nasal spray Place 2 sprays into both nostrils 2 (two) times daily as needed for congestion.     . pantoprazole (PROTONIX) 40 MG tablet Take 40 mg by mouth daily before breakfast.     . Polyethyl Glycol-Propyl Glycol (SYSTANE ULTRA) 0.4-0.3 % SOLN Place 1 drop into both eyes daily as needed (for dry eyes).     . polyethylene glycol (MIRALAX / GLYCOLAX) packet Take 17 g by mouth daily as needed for mild constipation. 14 each 0  . sotalol (BETAPACE) 80 MG tablet Take 1 tablet (80 mg total) by mouth daily.    .Marland KitchentiZANidine (ZANAFLEX) 4 MG tablet Take 4 mg by mouth at bedtime as needed for muscle spasms.    . traMADol (ULTRAM) 50 MG tablet Take 1 tablet (50 mg total) by mouth every 6 (six) hours as needed for moderate pain. 15 tablet 0    Physical Exam: General  appearance: alert and no distress Lungs: diminished breath sounds bilaterally and rhonchi bilaterally Heart: regular rate and rhythm Extremities: extremities normal, atraumatic, no cyanosis or edema Neurologic: Mental status: Alert, oriented, thought content appropriate  Lab Results: Results for orders placed or performed during the hospital encounter of 12/16/15 (from the past 48 hour(s))  Troponin I (q 6hr x 3)     Status: None   Collection Time: 12/21/15  5:55 PM  Result Value Ref Range   Troponin I <0.03 <0.03 ng/mL  Troponin I (q 6hr x 3)     Status:  None   Collection Time: 12/22/15 12:05 AM  Result Value Ref Range   Troponin I <0.03 <0.03 ng/mL  CBC     Status: Abnormal   Collection Time: 12/22/15  5:31 AM  Result Value Ref Range   WBC 11.9 (H) 4.0 - 10.5 K/uL   RBC 3.43 (L) 3.87 - 5.11 MIL/uL   Hemoglobin 10.9 (L) 12.0 - 15.0 g/dL   HCT 32.9 (L) 36.0 - 46.0 %   MCV 95.9 78.0 - 100.0 fL   MCH 31.8 26.0 - 34.0 pg   MCHC 33.1 30.0 - 36.0 g/dL   RDW 18.6 (H) 11.5 - 15.5 %   Platelets 125 (L) 150 - 400 K/uL  Comprehensive metabolic panel     Status: Abnormal   Collection Time: 12/22/15  5:31 AM  Result Value Ref Range   Sodium 139 135 - 145 mmol/L   Potassium 4.4 3.5 - 5.1 mmol/L   Chloride 100 (L) 101 - 111 mmol/L   CO2 33 (H) 22 - 32 mmol/L   Glucose, Bld 123 (H) 65 - 99 mg/dL   BUN 17 6 - 20 mg/dL   Creatinine, Ser 0.95 0.44 - 1.00 mg/dL   Calcium 9.4 8.9 - 10.3 mg/dL   Total Protein 5.6 (L) 6.5 - 8.1 g/dL   Albumin 3.1 (L) 3.5 - 5.0 g/dL   AST 31 15 - 41 U/L   ALT 10 (L) 14 - 54 U/L   Alkaline Phosphatase 83 38 - 126 U/L   Total Bilirubin 1.7 (H) 0.3 - 1.2 mg/dL   GFR calc non Af Amer 58 (L) >60 mL/min   GFR calc Af Amer >60 >60 mL/min    Comment: (NOTE) The eGFR has been calculated using the CKD EPI equation. This calculation has not been validated in all clinical situations. eGFR's persistently <60 mL/min signify possible Chronic Kidney Disease.    Anion gap 6 5 - 15  CBC     Status: Abnormal   Collection Time: 12/23/15  6:01 AM  Result Value Ref Range   WBC 11.7 (H) 4.0 - 10.5 K/uL   RBC 3.62 (L) 3.87 - 5.11 MIL/uL   Hemoglobin 11.2 (L) 12.0 - 15.0 g/dL   HCT 35.7 (L) 36.0 - 46.0 %   MCV 98.6 78.0 - 100.0 fL   MCH 30.9 26.0 - 34.0 pg   MCHC 31.4 30.0 - 36.0 g/dL   RDW 18.7 (H) 11.5 - 15.5 %   Platelets 125 (L) 150 - 400 K/uL  Comprehensive metabolic panel     Status: Abnormal   Collection Time: 12/23/15  6:01 AM  Result Value Ref Range   Sodium 139 135 - 145 mmol/L   Potassium 4.5 3.5 - 5.1 mmol/L    Chloride 103 101 - 111 mmol/L   CO2 30 22 - 32 mmol/L   Glucose, Bld 134 (H) 65 - 99 mg/dL   BUN 21 (H) 6 -  20 mg/dL   Creatinine, Ser 1.21 (H) 0.44 - 1.00 mg/dL   Calcium 9.0 8.9 - 10.3 mg/dL   Total Protein 5.7 (L) 6.5 - 8.1 g/dL   Albumin 3.2 (L) 3.5 - 5.0 g/dL   AST 32 15 - 41 U/L   ALT 11 (L) 14 - 54 U/L   Alkaline Phosphatase 80 38 - 126 U/L   Total Bilirubin 2.0 (H) 0.3 - 1.2 mg/dL   GFR calc non Af Amer 44 (L) >60 mL/min   GFR calc Af Amer 51 (L) >60 mL/min    Comment: (NOTE) The eGFR has been calculated using the CKD EPI equation. This calculation has not been validated in all clinical situations. eGFR's persistently <60 mL/min signify possible Chronic Kidney Disease.    Anion gap 6 5 - 15  Magnesium     Status: Abnormal   Collection Time: 12/23/15  6:01 AM  Result Value Ref Range   Magnesium 1.6 (L) 1.7 - 2.4 mg/dL    Imaging: Dg Chest 2 View  Result Date: 12/21/2015 CLINICAL DATA:  New onset productive cough this morning with shortness-of-breath and weakness. Mild hypoxia. EXAM: CHEST  2 VIEW COMPARISON:  12/16/2015, 11/27/2009 and chest CT 12/18/2015 FINDINGS: Median sternotomy wires are unchanged. Lungs are adequately inflated with minimal linear scarring over the left midlung and left base unchanged. Hazy opacification over the right middle lobe without significant change. Mild stable cardiomegaly. Calcified plaque is present over the aortic arch. There are degenerative changes of the spine. IMPRESSION: Hazy opacification over the right middle lobe unchanged and may be due to atelectasis or infection. Mild stable cardiomegaly. Aortic atherosclerosis. Electronically Signed   By: Marin Olp M.D.   On: 12/21/2015 16:00   Ct Chest Wo Contrast  Result Date: 12/22/2015 CLINICAL DATA:  Cough.  Shortness of breath. EXAM: CT CHEST WITHOUT CONTRAST TECHNIQUE: Multidetector CT imaging of the chest was performed following the standard protocol without IV contrast. COMPARISON:   12/21/2015 radiograph and 12/18/2015 chest CT FINDINGS: Cardiovascular: Coronary, aortic arch, and branch vessel atherosclerotic vascular disease. Moderate cardiomegaly. Prior CABG. Prominent main pulmonary artery. Mediastinum/Nodes: Small mediastinal lymph nodes are not pathologically enlarged by size criteria. Lungs/Pleura: Compared 4 days ago, the is a new 4 mm right upper lobe nodule on image 37 of series 4 which is likely inflammatory given that it was not present previously. There is some scattered atelectasis in the right upper lobe along with some notable bilateral airway thickening. There is new complete atelectasis of the right middle lobe with new filling or occlusion of the right middle lobe bronchus. There is occlusion of segmental bronchi in the right lower lobe with infrahilar atelectasis and bandlike densities. Similar scarring or atelectasis in the left upper lobe compared to the prior exam. Mildly increased atelectasis in the left lower lobe. Upper Abdomen: New mild perihepatic ascites. Musculoskeletal: Unremarkable IMPRESSION: 1. Worsening bilateral airway thickening, with new complete occlusion of the right middle lobe bronchus and complete atelectasis of the right middle lobe, and segmental bronchi inclusion in the right lower lobe with increasing right infrahilar atelectasis. There is also mildly increased left lower lobe atelectasis. 2. New mild perihepatic ascites, cause uncertain. 3. Coronary, aortic arch, and branch vessel atherosclerotic vascular disease. Moderate cardiomegaly. Prominent main pulmonary artery suggesting pulmonary arterial hypertension. Prior CABG. Electronically Signed   By: Van Clines M.D.   On: 12/22/2015 19:14    Assessment:  1. Principal Problem: 2.   Acute respiratory failure with hypoxia (HCC) 3. Active  Problems: 4.   COPD exacerbation ([place]) 5.   Atrial fibrillation with controlled ventricular response ([place]) 6.   Acute congestive heart failure  ([place]) 7.   Community acquired pneumonia of right middle lobe of lung ([place]) 8.   Fall 9.   Prolonged Q-T interval on ECG 10.   Chronic anticoagulation 11.   Plan:  1. A-fib with tachy-brady syndrome. Failed sotalol for long QTc. Symptomatic with HR in 40s yesterday. Will as for EP evaluation today for possible pacemaker at some point, likely after her pneumonia improves and she successfully weans off of inotropes. She in maintaining sinus at this point in the 60's.  Time Spent Directly with Patient:  15 minutes  Length of Stay:  LOS: 7 days   Pixie Casino, MD, [facility] Attending Cardiologist [place] 12/23/2015, 11:56 AM

## 2015-12-23 NOTE — Progress Notes (Signed)
RN notified Gabrielle [name],sister of the patient that the patient had been transferred to ICU/Stepdown.  Gabrielle [name] was not upset and stated that she would pass the message along to Gabrielle [name] , the patient's son.

## 2015-12-24 ENCOUNTER — Encounter (HOSPITAL_COMMUNITY)
Admission: EM | Disposition: A | Discharge status: Home Health | Payer: Self-pay | Source: Home / Self Care | Attending: Internal Medicine

## 2015-12-24 ENCOUNTER — Inpatient Hospital Stay (HOSPITAL_COMMUNITY): Payer: Medicare Other

## 2015-12-24 ENCOUNTER — Encounter (HOSPITAL_COMMUNITY): Payer: Self-pay

## 2015-12-24 DIAGNOSIS — J9811 Atelectasis: Secondary | ICD-10-CM

## 2015-12-24 DIAGNOSIS — R1310 Dysphagia, unspecified: Secondary | ICD-10-CM

## 2015-12-24 DIAGNOSIS — I495 Sick sinus syndrome: Secondary | ICD-10-CM

## 2015-12-24 HISTORY — PX: ESOPHAGOGASTRODUODENOSCOPY: SHX5428

## 2015-12-24 LAB — COMPREHENSIVE METABOLIC PANEL
ALT: 12 U/L — ABNORMAL LOW (ref 14–54)
AST: 32 U/L (ref 15–41)
Albumin: 3.4 g/dL — ABNORMAL LOW (ref 3.5–5.0)
Alkaline Phosphatase: 89 U/L (ref 38–126)
Anion gap: 7 (ref 5–15)
BUN: 17 mg/dL (ref 6–20)
CO2: 26 mmol/L (ref 22–32)
Calcium: 9.2 mg/dL (ref 8.9–10.3)
Chloride: 105 mmol/L (ref 101–111)
Creatinine, Ser: 0.89 mg/dL (ref 0.44–1.00)
GFR calc Af Amer: >60 mL/min (ref >60)
GFR calc non Af Amer: >60 mL/min (ref >60)
Glucose, Bld: 119 mg/dL — ABNORMAL HIGH (ref 65–99)
Potassium: 4.4 mmol/L (ref 3.5–5.1)
Sodium: 138 mmol/L (ref 135–145)
Total Bilirubin: 1.7 mg/dL — ABNORMAL HIGH (ref 0.3–1.2)
Total Protein: 5.9 g/dL — ABNORMAL LOW (ref 6.5–8.1)

## 2015-12-24 LAB — MAGNESIUM: Magnesium: 2.3 mg/dL (ref 1.7–2.4)

## 2015-12-24 LAB — CBC
HCT: 36.3 % (ref 36.0–46.0)
Hemoglobin: 11.6 g/dL — ABNORMAL LOW (ref 12.0–15.0)
MCH: 31.2 pg (ref 26.0–34.0)
MCHC: 32 g/dL (ref 30.0–36.0)
MCV: 97.6 fL (ref 78.0–100.0)
Platelets: 137 10*3/uL — ABNORMAL LOW (ref 150–400)
RBC: 3.72 MIL/uL — ABNORMAL LOW (ref 3.87–5.11)
RDW: 17.8 % — ABNORMAL HIGH (ref 11.5–15.5)
WBC: 12.7 10*3/uL — ABNORMAL HIGH (ref 4.0–10.5)

## 2015-12-24 LAB — HEPARIN LEVEL (UNFRACTIONATED)
Heparin Unfractionated: >2.2 IU/mL — ABNORMAL HIGH (ref 0.30–0.70)
Heparin Unfractionated: >2.2 IU/mL — ABNORMAL HIGH (ref 0.30–0.70)

## 2015-12-24 LAB — APTT
aPTT: 163 seconds (ref 24–36)
aPTT: 142 seconds — ABNORMAL HIGH (ref 24–36)

## 2015-12-24 SURGERY — EGD (ESOPHAGOGASTRODUODENOSCOPY)
Anesthesia: Moderate Sedation

## 2015-12-24 MED ORDER — MIDAZOLAM HCL 10 MG/2ML IJ SOLN
INTRAMUSCULAR | Status: DC | PRN
  Administered 2015-12-24 (×2): 2 mg via INTRAVENOUS

## 2015-12-24 MED ORDER — DIPHENHYDRAMINE HCL 50 MG/ML IJ SOLN
INTRAMUSCULAR | Status: AC
  Filled 2015-12-24: qty 1

## 2015-12-24 MED ORDER — HEPARIN (PORCINE) IN NACL 100-0.45 UNIT/ML-% IJ SOLN
750.0000 [IU]/h | INTRAMUSCULAR | Status: DC
  Administered 2015-12-24: 750 [IU]/h via INTRAVENOUS
  Filled 2015-12-24: qty 250

## 2015-12-24 MED ORDER — FENTANYL CITRATE (PF) 100 MCG/2ML IJ SOLN
INTRAMUSCULAR | Status: AC
  Filled 2015-12-24: qty 2

## 2015-12-24 MED ORDER — MIDAZOLAM HCL 5 MG/ML IJ SOLN
INTRAMUSCULAR | Status: AC
  Filled 2015-12-24: qty 2

## 2015-12-24 MED ORDER — HEPARIN (PORCINE) IN NACL 100-0.45 UNIT/ML-% IJ SOLN: 850.0000 [IU]/h | INTRAMUSCULAR | Status: DC

## 2015-12-24 MED ORDER — METOPROLOL TARTRATE 5 MG/5ML IV SOLN
5.0000 mg | INTRAVENOUS | Status: DC | PRN
  Administered 2015-12-25: 5 mg via INTRAVENOUS
  Filled 2015-12-24: qty 5

## 2015-12-24 MED ORDER — HEPARIN (PORCINE) IN NACL 100-0.45 UNIT/ML-% IJ SOLN
600.0000 [IU]/h | INTRAMUSCULAR | Status: DC
  Administered 2015-12-25: 600 [IU]/h via INTRAVENOUS
  Filled 2015-12-24: qty 250

## 2015-12-24 MED ORDER — SUCRALFATE 1 GM/10ML PO SUSP
1.0000 g | Freq: Three times a day (TID) | ORAL | Status: DC
  Administered 2015-12-24 – 2015-12-28 (×17): 1 g via ORAL
  Filled 2015-12-24 (×17): qty 10

## 2015-12-24 MED ORDER — FENTANYL CITRATE (PF) 100 MCG/2ML IJ SOLN
INTRAMUSCULAR | Status: DC | PRN
  Administered 2015-12-24 (×2): 25 ug via INTRAVENOUS

## 2015-12-24 MED ORDER — SODIUM CHLORIDE 3 % IN NEBU
4.0000 mL | INHALATION_SOLUTION | Freq: Every day | RESPIRATORY_TRACT | Status: AC
  Administered 2015-12-24 – 2015-12-26 (×3): 4 mL via RESPIRATORY_TRACT
  Filled 2015-12-24 (×2): qty 15
  Filled 2015-12-24: qty 4

## 2015-12-24 MED ORDER — PANTOPRAZOLE SODIUM 40 MG IV SOLR
40.0000 mg | INTRAVENOUS | Status: DC
  Administered 2015-12-24 – 2015-12-25 (×2): 40 mg via INTRAVENOUS
  Filled 2015-12-24 (×2): qty 40

## 2015-12-24 NOTE — Progress Notes (Signed)
ANTICOAGULATION CONSULT NOTE - Follow up Consult  Pharmacy Consult for Heparin Indication: Afib, Eliquis is held  Allergies  Allergen Reactions  . Novocain [Procaine] Hives and Other (See Comments)    palpitations  . Penicillins Hives    Has patient had a PCN reaction causing immediate rash, facial/tongue/throat swelling, SOB or lightheadedness with hypotension: Yes Has patient had a PCN reaction causing severe rash involving mucus membranes or skin necrosis: No Has patient had a PCN reaction that required hospitalization No Has patient had a PCN reaction occurring within the last 10 years: No If all of the above answers are "NO", then may proceed with Cephalosporin use.    Patient Measurements: Height: 5\' 2"  (157.5 cm) Weight: 168 lb (76.2 kg) IBW/kg (Calculated) : 50.1 Heparin Dosing Weight: 67 kg  Vital Signs: Temp: 97.7 F (36.5 C) (10/24 1726) Temp Source: Oral (10/24 1726) BP: 115/53 (10/24 1726) Pulse Rate: 74 (10/24 1726)  Labs:  Recent Labs  12/22/15 0005  12/22/15 0531 12/23/15 0601 12/24/15 0014 12/24/15 0017 12/24/15 0326 12/24/15 1900  HGB  --   < > 10.9* 11.2*  --   --  11.6*  --   HCT  --   --  32.9* 35.7*  --   --  36.3  --   PLT  --   --  125* 125*  --   --  137*  --   APTT  --   --   --   --  163*  --   --  142*  HEPARINUNFRC  --   --   --   --   --  >2.20*  --  >2.20*  CREATININE  --   --  0.95 1.21*  --   --  0.89  --   TROPONINI <0.03  --   --   --   --   --   --   --   < > = values in this interval not displayed.  Estimated Creatinine Clearance: 54.6 mL/min (by C-G formula based on SCr of 0.89 mg/dL).  Medications:  Scheduled:  . atorvastatin  20 mg Oral QPM  . aztreonam  1 g Intravenous Q8H  . ciprofloxacin-dexamethasone  4 drop Both Ears BID  . clindamycin (CLEOCIN) IV  300 mg Intravenous Q8H  . fluticasone  2 spray Each Nare BID  . furosemide  20 mg Oral Daily  . guaiFENesin  600 mg Oral BID  . levalbuterol  0.63 mg Nebulization  TID  . losartan  25 mg Oral Daily  . mouth rinse  15 mL Mouth Rinse BID  . mometasone-formoterol  2 puff Inhalation BID  . pantoprazole (PROTONIX) IV  40 mg Intravenous Q24H  . polyethylene glycol  17 g Oral Daily  . senna-docusate  1 tablet Oral BID  . sodium chloride flush  3 mL Intravenous Q12H  . sodium chloride HYPERTONIC  4 mL Nebulization Daily  . sucralfate  1 g Oral TID WC & HS   Infusions:  . heparin     Assessment: Gabrielle [name] is a 70 y.o. female admitted on 12/16/2015 with ADHF, possible COPD exacerbation, or pneumonia.  During diuresis, she developed Afib with RVR and was started on Eliquis anticoagulation on 10/20.  She has NOT been on chronic anticoagulation PTA due to history of GIB.  During her swallow eval on 10/23 she vomited and has been made NPO.  Pharmacy is now consulted to dose Heparin IV while Eliquis PO is held.  Most  recent Eliquis dose charted on 10/22 at 2250.  Morning dose on 10/23 was NOT given d/t vomiting. Begin heparin gtt > 12 hrs after last Eliquis dose.  Today, 12/24/2015:  Heparin was held from 0700 -1030 for endoscopy  Previous heparin level >2.2 (falsely elevated from Eliquis) and APTT 163 (supra-therapeutic)  SCr 0.89, CrCl ~ 55 ml/min  CBC: Hgb 11.6, Plt 137 (baseline 70-120's)  Heparin was resumed @ 10:51 at a rate of 750 units/hr  Heparin level > 2.2 (falsely elevated from Eliquis) and aPTT 142  No bleeding or complications reported.    Goal of Therapy:  Heparin level 0.3-0.7 units/ml aPTT 66-102 seconds Monitor platelets by anticoagulation protocol: Yes   Plan:   Decrease heparin IV infusion  600 units/hr   Heparin level and APTT 8 hours after change in dose  Continue to check APTT with each HL until effects of Eliquis on heparin level diminish.  Daily heparin level and CBC  Continue to monitor H&H and platelets   Terrilee FilesLeann Alin Hutchins, PharmD 12/24/2015 8:19 PM

## 2015-12-24 NOTE — Interval H&P Note (Signed)
History and Physical Interval Note:  12/24/2015 9:28 AM  Gabrielle HaverVirginia W [name]  has presented today for surgery, with the diagnosis of .  The various methods of treatment have been discussed with the patient and family. After consideration of risks, benefits and other options for treatment, the patient has consented to  Procedure(s): ESOPHAGOGASTRODUODENOSCOPY (EGD) (N/A) as a surgical intervention .  The patient's history has been reviewed, patient examined, no change in status, stable for surgery.  I have reviewed the patient's chart and labs.  Questions were answered to the patient's satisfaction.     Tejon Gracie JR,Alyia Lacerte L

## 2015-12-24 NOTE — Progress Notes (Signed)
Patient Name: Gabrielle [name] Date of Encounter: 12/24/2015  Primary Cardiologist: [facility] Problem List     Principal Problem:   Acute respiratory failure with hypoxia [facility]) Active Problems:   COPD exacerbation (HCC)   Atrial fibrillation with controlled ventricular response (HCC)   Coronary artery disease due to lipid rich plaque   Acute on chronic diastolic CHF (congestive heart failure) (HCC)   Community acquired pneumonia of right middle lobe of lung (HCC)   Fall   Prolonged Q-T interval on ECG   Chronic anticoagulation   Dysphagia   Tachy-brady syndrome (HCC)    Subjective   Feels like she can feel something clogged in her lung. Says that when she did the barium swallow she could feel that the material never made it down to her stomach. Denies any CP. SOB is stable.  Inpatient Medications    . acetylcysteine  4 mL Nebulization TID  . atorvastatin  20 mg Oral QPM  . aztreonam  1 g Intravenous Q8H  . ciprofloxacin-dexamethasone  4 drop Both Ears BID  . clindamycin (CLEOCIN) IV  300 mg Intravenous Q8H  . fluticasone  2 spray Each Nare BID  . furosemide  20 mg Oral Daily  . guaiFENesin  600 mg Oral BID  . levalbuterol  0.63 mg Nebulization TID  . losartan  25 mg Oral Daily  . mouth rinse  15 mL Mouth Rinse BID  . mometasone-formoterol  2 puff Inhalation BID  . pantoprazole  40 mg Oral QAC breakfast  . polyethylene glycol  17 g Oral Daily  . senna-docusate  1 tablet Oral BID  . sodium chloride flush  3 mL Intravenous Q12H    Vital Signs    Vitals:   12/24/15 0600 12/24/15 0630 12/24/15 0700 12/24/15 0756  BP: (!) 112/38 (!) 113/35 (!) 122/41   Pulse:    (!) 103  Resp: (!) 28 14 14 20   Temp:      TempSrc:      SpO2:   95% 96%  Weight:      Height:        Intake/Output Summary (Last 24 hours) at 12/24/15 0802 Last data filed at 12/24/15 0650  Gross per 24 hour  Intake           702.95 ml  Output              675 ml  Net            27.95  ml   Filed Weights   12/22/15 0649 12/23/15 0435 12/24/15 0545  Weight: 169 lb 8 oz (76.9 kg) 168 lb 10.4 oz (76.5 kg) 168 lb 3.4 oz (76.3 kg)    Physical Exam    General: Well developed, well nourished WF, in no acute distress. HEENT: Normocephalic, atraumatic, sclera non-icteric, no xanthomas, nares are without discharge. Neck: Negative for carotid bruits. JVP not elevated. Lungs: Diminished BS bilaterally, no wheezes or rhonchi. Breathing is unlabored. Cardiac: RRR S1 S2 without murmurs, rubs, or gallops.  Abdomen: Soft, non-tender, non-distended with normoactive bowel sounds. No rebound/guarding. Extremities: No clubbing or cyanosis. No edema. Distal pedal pulses are 2+ and equal bilaterally. Skin: Warm and dry, no significant rash. Neuro: Alert and oriented X 3. Strength and sensation in tact. Psych:  Responds to questions appropriately with a normal affect.  Labs    CBC  Recent Labs  12/23/15 0601 12/24/15 0326  WBC 11.7* 12.7*  HGB 11.2* 11.6*  HCT 35.7* 36.3  MCV 98.6 97.6  PLT 125* 137*   Basic Metabolic Panel  Recent Labs  12/23/15 0601 12/24/15 0326  NA 139 138  K 4.5 4.4  CL 103 105  CO2 30 26  GLUCOSE 134* 119*  BUN 21* 17  CREATININE 1.21* 0.89  CALCIUM 9.0 9.2  MG 1.6* 2.3   Liver Function Tests  Recent Labs  12/23/15 0601 12/24/15 0326  AST 32 32  ALT 11* 12*  ALKPHOS 80 89  BILITOT 2.0* 1.7*  PROT 5.7* 5.9*  ALBUMIN 3.2* 3.4*   Cardiac Enzymes  Recent Labs  12/21/15 1123 12/21/15 1755 12/22/15 0005  TROPONINI <0.03 <0.03 <0.03   Telemetry    NSR with occ PACs. Intermittently her telemetry appears with very interesting atrial fibrillatory waves but the overall rate does not change from sinus - it seems like this is artifact correlated with her coughing. No further bradycardia  Radiology    Dg Chest 2 View  Result Date: 12/21/2015 CLINICAL DATA:  New onset productive cough this morning with shortness-of-breath and  weakness. Mild hypoxia. EXAM: CHEST  2 VIEW COMPARISON:  12/16/2015, 11/27/2009 and chest CT 12/18/2015 FINDINGS: Median sternotomy wires are unchanged. Lungs are adequately inflated with minimal linear scarring over the left midlung and left base unchanged. Hazy opacification over the right middle lobe without significant change. Mild stable cardiomegaly. Calcified plaque is present over the aortic arch. There are degenerative changes of the spine. IMPRESSION: Hazy opacification over the right middle lobe unchanged and may be due to atelectasis or infection. Mild stable cardiomegaly. Aortic atherosclerosis. Electronically Signed   By: Elberta Fortis M.D.   On: 12/21/2015 16:00   Dg Chest 2 View  Result Date: 12/16/2015 CLINICAL DATA:  Chest pain and shortness of Breath EXAM: CHEST  2 VIEW COMPARISON:  11/27/2009 FINDINGS: Cardiac shadow is enlarged. Postsurgical changes are again seen. The lungs are hyperinflated with some mid lung scarring on the left. Increased density in the right lung base is noted projecting in the right middle lobe on the lateral projection. Mild interstitial edema is noted particularly in the right lung base. No sizable effusion is seen. No acute bony abnormality is noted. IMPRESSION: Right middle lobe infiltrate. Mild interstitial edema is noted in the right lung base. Electronically Signed   By: Alcide Clever M.D.   On: 12/16/2015 17:06   Dg Thoracic Spine 2 View  Result Date: 12/17/2015 CLINICAL DATA:  Back pain.  Fall. EXAM: THORACIC SPINE 2 VIEWS COMPARISON:  12/16/2015. FINDINGS: No acute bony abnormality identified. Degenerative changes thoracic spine. Stable minimal upper thoracic vertebral body compressions. Aortic atherosclerotic vascular calcification. Prior CABG. IMPRESSION: 1. Diffuse degenerative changes thoracic spine. Old mild upper thoracic compressions noted. No acute abnormality. 2. Aortic atherosclerotic vascular disease.  Prior CABG. Electronically Signed   By:  Maisie Fus  Register   On: 12/17/2015 07:14   Dg Lumbar Spine Complete  Result Date: 12/17/2015 CLINICAL DATA:  Shoulder pain.  Fall. EXAM: LUMBAR SPINE - COMPLETE 4+ VIEW COMPARISON:  Ultrasound 07/30/2015.  MRI 08/16/2013.  CT 12/01/2010. FINDINGS: Diffuse multilevel degenerative change. 4 mm anterolisthesis L4 on L5. Mild L3 vertebral body compression. This is new from prior study of 08/16/2013. Aortic atherosclerotic vascular calcification. Aneurysmal dilatation of the distal abdominal aorta 3 cm cannot be excluded. Abdominal aortic ultrasound can be obtained for further evaluation . Left nephrolithiasis again noted. IMPRESSION: 1. Mild L3 vertebral body compression. This is new from prior study of 08/16/2013. 2. Diffuse degenerative change lumbar spine with 4  mm anterolisthesis L4 on L5. 3.Aortic atherosclerotic vascular disease. Aneurysmal dilatation of the distal abdominal aorta to 3 cm cannot be excluded. Abdominal aortic ultrasound can be obtained for further evaluation. Left nephrolithiasis again noted. 4. Left nephrolithiasis again noted. Electronically Signed   By: Maisie Fus  Register   On: 12/17/2015 07:21   Dg Pelvis 1-2 Views  Result Date: 12/17/2015 CLINICAL DATA:  Back pain.  Fall. EXAM: PELVIS - 1-2 VIEW COMPARISON:  CT 12/01/2010. FINDINGS: Degenerative changes lumbar spine and both hips. No acute bony abnormality. Vascular calcification. IMPRESSION: Degenerative changes lumbar spine and both hips. No acute bony abnormality. Electronically Signed   By: Maisie Fus  Register   On: 12/17/2015 07:19   Ct Chest Wo Contrast  Result Date: 12/22/2015 CLINICAL DATA:  Cough.  Shortness of breath. EXAM: CT CHEST WITHOUT CONTRAST TECHNIQUE: Multidetector CT imaging of the chest was performed following the standard protocol without IV contrast. COMPARISON:  12/21/2015 radiograph and 12/18/2015 chest CT FINDINGS: Cardiovascular: Coronary, aortic arch, and branch vessel atherosclerotic vascular disease.  Moderate cardiomegaly. Prior CABG. Prominent main pulmonary artery. Mediastinum/Nodes: Small mediastinal lymph nodes are not pathologically enlarged by size criteria. Lungs/Pleura: Compared 4 days ago, the is a new 4 mm right upper lobe nodule on image 37 of series 4 which is likely inflammatory given that it was not present previously. There is some scattered atelectasis in the right upper lobe along with some notable bilateral airway thickening. There is new complete atelectasis of the right middle lobe with new filling or occlusion of the right middle lobe bronchus. There is occlusion of segmental bronchi in the right lower lobe with infrahilar atelectasis and bandlike densities. Similar scarring or atelectasis in the left upper lobe compared to the prior exam. Mildly increased atelectasis in the left lower lobe. Upper Abdomen: New mild perihepatic ascites. Musculoskeletal: Unremarkable IMPRESSION: 1. Worsening bilateral airway thickening, with new complete occlusion of the right middle lobe bronchus and complete atelectasis of the right middle lobe, and segmental bronchi inclusion in the right lower lobe with increasing right infrahilar atelectasis. There is also mildly increased left lower lobe atelectasis. 2. New mild perihepatic ascites, cause uncertain. 3. Coronary, aortic arch, and branch vessel atherosclerotic vascular disease. Moderate cardiomegaly. Prominent main pulmonary artery suggesting pulmonary arterial hypertension. Prior CABG. Electronically Signed   By: Gaylyn Rong M.D.   On: 12/22/2015 19:14   Ct Chest Wo Contrast  Result Date: 12/19/2015 CLINICAL DATA:  Acute onset of respiratory failure. Hypoxia. Atrial fibrillation. Initial encounter. EXAM: CT CHEST WITHOUT CONTRAST TECHNIQUE: Multidetector CT imaging of the chest was performed following the standard protocol without IV contrast. COMPARISON:  Chest radiograph performed 12/16/2015 FINDINGS: Cardiovascular: The heart is mildly  enlarged. Diffuse coronary artery calcifications are seen. Calcification is noted at the mitral valve. Scattered calcification is noted along the thoracic aorta. The great vessels are grossly unremarkable. Mediastinum/Nodes: The mediastinum is otherwise unremarkable. No mediastinal lymphadenopathy is seen. No pericardial effusion is identified. The thyroid gland is grossly unremarkable. No axillary lymphadenopathy is seen. The patient is status post median sternotomy. Lungs/Pleura: Nodular interstitial opacities are noted at the lung bases. This may reflect scarring, atypical infection, or mild interstitial lung disease. No pleural effusion or pneumothorax is seen. Upper Abdomen: The visualized portions of the liver and spleen are grossly unremarkable. The visualized portions of the pancreas and adrenal glands are grossly unremarkable. There is mild left renal atrophy. Nonobstructing bilateral renal stones are seen, measuring up to 3 mm in size. Musculoskeletal: No acute osseous abnormalities  are identified. The visualized musculature is unremarkable in appearance. IMPRESSION: 1. Nodular interstitial opacities at the lung bases. This may reflect scarring, atypical infection or mild interstitial lung disease. 2. Mild cardiomegaly. Diffuse coronary artery calcifications seen. Calcification at the mitral valve. 3. Mild left renal atrophy. Nonobstructing bilateral renal stones, measuring up to 3 mm in size. Electronically Signed   By: Roanna Raider M.D.   On: 12/19/2015 00:49   Dg Esophagus  Addendum Date: 12/23/2015   ADDENDUM REPORT: 12/23/2015 13:51 ADDENDUM: These results were called by telephone at the time of interpretation on 12/23/2015 at 11:51 am to Dr. Albertine Grates , who verbally acknowledged these results. Electronically Signed   By: Charlett Nose M.D.   On: 12/23/2015 13:51   Result Date: 12/23/2015 CLINICAL DATA:  Difficulty swallowing, possible aspiration. EXAM: ESOPHOGRAM/BARIUM SWALLOW TECHNIQUE:  Single contrast examination was performed using  thin barium. FLUOROSCOPY TIME:  Fluoroscopy Time:  1 minutes 12 seconds Radiation Exposure Index (if provided by the fluoroscopic device): 8.5 mGy Number of Acquired Spot Images: 1 COMPARISON:  CT 12/22/2015 FINDINGS: There is near complete obstruction of the distal esophagus with irregular filling defects. A small amount of contrast finally passes through the distal esophagus. Findings concerning for food impaction. Full column stasis of contrast above the distal obstruction with very slow emptying of the esophagus. IMPRESSION: Near complete obstruction of the distal esophagus with filling defects and a very slow trickle of contrast through the distal esophagus into the stomach. Findings concerning for fecal impaction. Cannot exclude partially obstructing underlying distal mass. Recommend further evaluation with endoscopy. Electronically Signed: By: Charlett Nose M.D. On: 12/23/2015 12:20   Dg Shoulder Left  Result Date: 12/16/2015 CLINICAL DATA:  Left shoulder pain following fall 2 days ago, initial encounter EXAM: LEFT SHOULDER - 2+ VIEW COMPARISON:  None. FINDINGS: There is no evidence of fracture or dislocation. There is no evidence of arthropathy or other focal bone abnormality. Soft tissues are unremarkable. Old healed rib fractures are noted. IMPRESSION: No acute abnormality noted. Electronically Signed   By: Alcide Clever M.D.   On: 12/16/2015 17:06   Ct Maxillofacial Wo Contrast  Result Date: 12/19/2015 CLINICAL DATA:  70 year old female with recent fall. EXAM: CT MAXILLOFACIAL WITHOUT CONTRAST TECHNIQUE: Multidetector CT imaging of the maxillofacial structures was performed. Multiplanar CT image reconstructions were also generated. A small metallic BB was placed on the right temple in order to reliably differentiate right from left. COMPARISON:  None. FINDINGS: Osseous: No acute fracture or dislocation. Chronic degenerative changes of the  temporomandibular joints. The bones are osteopenic. Orbits: Negative. No traumatic or inflammatory finding. Sinuses: Clear. Soft tissues: Negative. Limited intracranial: No significant or unexpected finding. IMPRESSION: No acute facial bone fractures. Electronically Signed   By: Elgie Collard M.D.   On: 12/19/2015 00:58     Patient Profile     67F with CAD s/p CABG 2010, paroxysmal atrial fibrillation (previously off anticoagulation secondary to GI bleed in 10/2015), CKD stage III, chronic diastolic CHF, HLD, and bradycardia who presented to [facility] ED on 12/16/2015 for increased falls, generalized weakness, & acute respiratory failure with hypoxia. Complicated hospitalization notable for PNA, hypomagnesemia, prolonged QT (sotalol/Celexa discontinued), acute on chronic diastolic CHF, recurrence of AF RVR 10/22 complicated by episodes of bradycardia/hypotension requiring dopamine (with resumption of Eliquis), as well as complete RML bronchus occlusion with RML collapse from mucus plugging versus aspiration in addition to solid food dysphagia.   Assessment & Plan    1. Parosysmal atrial fibrillation  with probable component of tachy-brady syndrome - EP to see today. Failed sotalol due to QT prolongation. Currently on heparin per pharmacy due to esophageal issues. Will need to be followed closely once Eliquis is resumed given h/o GIB. Maintaining NSR this morning -  intermittently her telemetry appears with very interesting atrial fibrillatory waves but the overall rate does not change from sinus - it seems like these tracings are more consistent with artifact correlated with her coughing.   2. Prolonged QTc - 492ms by last EKG. Appreciate EP input on this as well.  3. Chronic diastolic CHF - weight stable for several days. Continue maintenance Lasix.  4. RML collapse and solid food dysphagia - being followed by both pulm and GI at this point.  5. CAD s/p CABG - no recent anginal-type chest pain (some CP  with coughing yesterday). Anterior TWI was seen on prior EKG 10/27/15. Trop neg this adm. Not on ASA due to concomitant plan for DOAC. Not on BB due to bradycardia. Continue statin.  Signed, Laurann Montanaayna N Khadim Lundberg, PA-C  12/24/2015, 8:02 AM

## 2015-12-24 NOTE — Progress Notes (Signed)
[facility] PROGRESS NOTE    Progress Note  Gabrielle [name]  ZOX:[id number] DOB: [birth year] DOA: 12/16/2015 PCP: [name], MD     Brief Narrative:   Gabrielle [name] is an 70 y.o. female past medical history of CAD status post CABG, paroxysmal atrial fibrillation, chronic systolic heart failure was referred to the ED by her PCP for shortness of breath, the patient felt 4 days prior to admission, she did not loose consciousness since the fall she's been having back pain she said her PCP on the day of admission found her to be hypoxic and send her to the ED, M.D. she was found to have bilateral wheezing and crackles chest x-ray showing possible pneumonia with vascular congestion.  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   Due to increased cough, ct chest was done on 10/22 found to have complete right middle lobe bronchus occulsion, pulmonary consulted, (patient reported chocked on a piece of chicken, but was able to cough it out ) DG esophagus showed food impaction, [facility] consulted.  She had egd done on 10/24 ,   Assessment/Plan:   Acute respiratory failure with hypoxia [facility]): ( initial presenting symptom with significant lower extremity edema) Patient presented to the hospital and admitted for sob /hypoxia and edema, likely due to acute decompensated diastolic heart failure initially. 2-D echo lvef wnl, does has diastolic dysfunction, cardiac biomarkers have been negative 2. She was treated with IV Lasix, then switched to oral lasix on 10/20, follow strict is and os, daily weights.  crackles and lower extremity  edema has much improved, but not able to  wean off oxygen  need to arrange home o2  Complete right middle lobe bronchus occlusion with right middle lobe collapse from mucus plugging vs aspiration ( she reported chocked on a piece of chicken prior to this) She is started on aztreonam and cleocin ( difficult to choose abx to cover aspiration pna due to pcn allergy and Qtc prolongation) Pulmonary consulted  Food impaction at distal esophagus [facility] consulted, EGD on 10/24  paroxysmal Atrial fibrillation with controlled ventricular response (HCC): sinus rhythm in the hospital until 10/22, then developed tachybrady syndrome and transferred to stepdown chads 2 vasc score is more than 2 Not on anticoagulation previously due to GI bleed. Cardiology recommends to restart eliquis, now on heparin drip due to not able to gi issues Cardiology consulted regarding sotalol in the setting of prolonged QT who recommended d/c sotalol Cardiology signed off and reconsulted to due afib/rvr, tachybrady and hypotension on 10/22, on dopamine drip, now weaned off dopamine drip, EP consulted   Prolonged QT: QTC on presentation D/c celexa,  Keep K.4, mag >2, Cardiology consulted regarding sotalol use in the setting of prolonged QT who recommended d/c sotalol Daily ekg to monitor Qtc, improving  HTN;  bp low normal on 10/20, reduce cozaar dose,  bp elevated on 10/22, add prn hydralazine Hold  bp meds on 10/23 due to on dopamine drip, she is able to wean off dopamine drip early am on 10/24 Likely will need to restart bp meds in 24hrs  Thrombocytopenia: Unclear etiology,  improving  Normocytic anemia: Does has h/o recurrent gi  bleed, no active bleed during this hospitalization anemia panel unremarkable  Right ear pain, right mastoid process tenderness Otoscope exam reveal opaque tympanic member , no fluids level, no perforation ct maxillofacial no acute finginds, started on ear drops, flonase,  doxycycline ( not a candidate for quinolones due to QTc, not a candidate for augmentin due to she is allergic to pcn)  Falls/FTT/poor memory: Not sure questionable near syncopal has anything to do with the prolonged QTC of 600. PT recommended SNF , patient declines, family requested assisted living placement, social worker consulted  Prior smoker, quit smoking several months ago, h/o copd, no significant wheezing, does has rhonchi and crackles, continue nebs, add mucinex     DVT prophylaxis: she was on elliquis, now on heparin drip, cardiology to manage heprin drip Family Communication: patient ,  Disposition Plan/Barrier to D/C:  Out of stepdown to  med tele if continue to be stable off dopamine drip  Code Status:     Code Status Orders        Start     Ordered   12/16/15 2148  Full code  Continuous     12/16/15 2148    Code Status History    Date Active Date Inactive Code Status Order ID Comments User Context   10/27/2015  4:16 PM 10/30/2015  6:03 PM Full Code [id number]  Meredith Pel, NP ED   10/21/2015  6:50 PM 10/25/2015  8:23 PM Full Code [id number]  Jeralyn Bennett, MD Inpatient        IV Access:    Peripheral IV   Procedures and diagnostic studies:   Ct Chest Wo Contrast  Result Date: 12/22/2015 CLINICAL DATA:  Cough.  Shortness of breath. EXAM: CT CHEST WITHOUT CONTRAST TECHNIQUE: Multidetector CT imaging of the chest was performed following the standard protocol without IV contrast. COMPARISON:  12/21/2015 radiograph and 12/18/2015 chest CT FINDINGS: Cardiovascular: Coronary, aortic arch, and branch vessel atherosclerotic vascular disease. Moderate cardiomegaly. Prior CABG. Prominent main pulmonary artery. Mediastinum/Nodes: Small mediastinal lymph nodes are not pathologically enlarged by size criteria. Lungs/Pleura: Compared 4 days ago, the is a new 4 mm right upper lobe nodule on image 37 of series 4 which is likely inflammatory given that it was not present  previously. There is some scattered atelectasis in the right upper lobe along with some notable bilateral airway thickening. There is new complete atelectasis of the right middle lobe with new filling or occlusion of the right middle lobe bronchus. There is occlusion of segmental bronchi in the right lower lobe with infrahilar atelectasis and bandlike densities. Similar scarring or atelectasis in the left upper lobe compared to the prior exam. Mildly increased atelectasis in the left lower lobe. Upper Abdomen: New mild perihepatic ascites. Musculoskeletal: Unremarkable IMPRESSION: 1. Worsening bilateral airway thickening, with new complete occlusion of the right middle lobe bronchus and complete atelectasis of the right middle lobe, and segmental bronchi inclusion in the right lower lobe with increasing right infrahilar atelectasis. There is also mildly increased left lower lobe atelectasis. 2. New mild perihepatic ascites, cause uncertain. 3. Coronary, aortic arch, and branch vessel atherosclerotic vascular disease. Moderate cardiomegaly. Prominent main pulmonary artery suggesting pulmonary arterial hypertension. Prior CABG. Electronically Signed   By: Gaylyn Rong M.D.   On: 12/22/2015 19:14   Dg Esophagus  Addendum Date: 12/23/2015   ADDENDUM REPORT: 12/23/2015 13:51 ADDENDUM: These results were called by telephone at the time of interpretation on 12/23/2015 at 11:51 am to Dr. Albertine Grates , who verbally  acknowledged these results. Electronically Signed   By: Charlett NoseKevin  Dover M.D.   On: 12/23/2015 13:51   Result Date: 12/23/2015 CLINICAL DATA:  Difficulty swallowing, possible aspiration. EXAM: ESOPHOGRAM/BARIUM SWALLOW TECHNIQUE: Single contrast examination was performed using  thin barium. FLUOROSCOPY TIME:  Fluoroscopy Time:  1 minutes 12 seconds Radiation Exposure Index (if provided by the fluoroscopic device): 8.5 mGy Number of Acquired Spot Images: 1 COMPARISON:  CT 12/22/2015 FINDINGS: There is near  complete obstruction of the distal esophagus with irregular filling defects. A small amount of contrast finally passes through the distal esophagus. Findings concerning for food impaction. Full column stasis of contrast above the distal obstruction with very slow emptying of the esophagus. IMPRESSION: Near complete obstruction of the distal esophagus with filling defects and a very slow trickle of contrast through the distal esophagus into the stomach. Findings concerning for fecal impaction. Cannot exclude partially obstructing underlying distal mass. Recommend further evaluation with endoscopy. Electronically Signed: By: Charlett NoseKevin  Dover M.D. On: 12/23/2015 12:20     Medical Consultants:    cardiology  Anti-Infectives:   IV Rocephin  on 12/17/2015 Doxycycline started on 12/16/2015 to 10/22 Aztreonam and cleocin from 10/22  Subjective:    Gabrielle [name] she is transferred to stepdown due to tachy/brady syndrome, currently on dopamine drip, vital stable, not in acute distress, twin sister and son in room  Objective:    Vitals:   12/24/15 0600 12/24/15 0630 12/24/15 0700 12/24/15 0756  BP: (!) 112/38 (!) 113/35 (!) 122/41   Pulse:    (!) 103  Resp: (!) 28 14 14 20   Temp:      TempSrc:      SpO2:   95% 96%  Weight:      Height:        Intake/Output Summary (Last 24 hours) at 12/24/15 0805 Last data filed at 12/24/15 0650  Gross per 24 hour  Intake           702.95 ml  Output             1025 ml  Net          -322.05 ml   Filed Weights   12/22/15 0649 12/23/15 0435 12/24/15 0545  Weight: 76.9 kg (169 lb 8 oz) 76.5 kg (168 lb 10.4 oz) 76.3 kg (168 lb 3.4 oz)    Exam: General exam: frail, but NAD Respiratory system:  No wheezing this am, does has mild crackles at basis Cardiovascular system: S1 & S2 heard, RRR.  Gastrointestinal system: Abdomen is nondistended, soft and nontender.  Central nervous system: Alert and oriented. No focal neurological deficits. Extremities:  + lower extremity pitting edema slowing improving Skin: No rashes, lesions or ulcers Psychiatry: Judgement and insight appear normal. Mood & affect appropriate.    Data Reviewed:    Labs: Basic Metabolic Panel:  Recent Labs Lab 12/19/15 0525 12/20/15 0411 12/21/15 0533 12/22/15 0531 12/23/15 0601 12/24/15 0326  NA 142 140 141 139 139 138  K 4.3 4.4 4.1 4.4 4.5 4.4  CL 101 101 99* 100* 103 105  CO2 33* 34* 34* 33* 30 26  GLUCOSE 101* 117* 103* 123* 134* 119*  BUN 11 14 17 17  21* 17  CREATININE 0.89 1.05* 1.06* 0.95 1.21* 0.89  CALCIUM 9.0 8.6* 9.1 9.4 9.0 9.2  MG 1.9 2.2 1.9  --  1.6* 2.3   GFR Estimated Creatinine Clearance: 54.7 mL/min (by C-G formula based on SCr of 0.89 mg/dL). Liver Function Tests:  Recent Labs  Lab 12/19/15 0525 12/21/15 0533 12/22/15 0531 12/23/15 0601 12/24/15 0326  AST 33 34 31 32 32  ALT 9* 10* 10* 11* 12*  ALKPHOS 83 83 83 80 89  BILITOT 2.7* 1.7* 1.7* 2.0* 1.7*  PROT 5.9* 5.4* 5.6* 5.7* 5.9*  ALBUMIN 3.4* 3.0* 3.1* 3.2* 3.4*   No results for input(s): LIPASE, AMYLASE in the last 168 hours. No results for input(s): AMMONIA in the last 168 hours. Coagulation profile No results for input(s): INR, PROTIME in the last 168 hours.  CBC:  Recent Labs Lab 12/19/15 0525 12/21/15 0533 12/22/15 0531 12/23/15 0601 12/24/15 0326  WBC 10.7* 8.2 11.9* 11.7* 12.7*  NEUTROABS 6.1 5.7  --   --   --   HGB 11.5* 10.8* 10.9* 11.2* 11.6*  HCT 35.9* 33.3* 32.9* 35.7* 36.3  MCV 95.7 97.1 95.9 98.6 97.6  PLT 130* 118* 125* 125* 137*   Cardiac Enzymes:  Recent Labs Lab 12/21/15 1123 12/21/15 1755 12/22/15 0005  TROPONINI <0.03 <0.03 <0.03   BNP (last 3 results) No results for input(s): PROBNP in the last 8760 hours. CBG:  Recent Labs Lab 12/19/15 1707  GLUCAP 125*   D-Dimer: No results for input(s): DDIMER in the last 72 hours. Hgb A1c: No results for input(s): HGBA1C in the last 72 hours. Lipid Profile: No results for  input(s): CHOL, HDL, LDLCALC, TRIG, CHOLHDL, LDLDIRECT in the last 72 hours. Thyroid function studies: No results for input(s): TSH, T4TOTAL, T3FREE, THYROIDAB in the last 72 hours.  Invalid input(s): FREET3 Anemia work up: No results for input(s): VITAMINB12, FOLATE, FERRITIN, TIBC, IRON, RETICCTPCT in the last 72 hours. Sepsis Labs:  Recent Labs Lab 12/21/15 0533 12/22/15 0531 12/23/15 0601 12/24/15 0326  WBC 8.2 11.9* 11.7* 12.7*   Microbiology Recent Results (from the past 240 hour(s))  MRSA PCR Screening     Status: None   Collection Time: 12/16/15  9:18 PM  Result Value Ref Range Status   MRSA by PCR NEGATIVE NEGATIVE Final    Comment:        The GeneXpert MRSA Assay (FDA approved for NASAL specimens only), is one component of a comprehensive MRSA colonization surveillance program. It is not intended to diagnose MRSA infection nor to guide or monitor treatment for MRSA infections.   Culture, sputum-assessment     Status: None   Collection Time: 12/18/15  7:03 AM  Result Value Ref Range Status   Specimen Description SPUTUM  Final   Special Requests NONE  Final   Sputum evaluation   Final    THIS SPECIMEN IS ACCEPTABLE. RESPIRATORY CULTURE REPORT TO FOLLOW.   Report Status 12/18/2015 FINAL  Final  Culture, respiratory (NON-Expectorated)     Status: None   Collection Time: 12/18/15  7:03 AM  Result Value Ref Range Status   Specimen Description SPUTUM  Final   Special Requests NONE  Final   Gram Stain   Final    MODERATE WBC PRESENT,BOTH PMN AND MONONUCLEAR FEW SQUAMOUS EPITHELIAL CELLS PRESENT ABUNDANT GRAM POSITIVE COCCI IN CLUSTERS MODERATE GRAM POSITIVE COCCI IN PAIRS FEW YEAST WITH PSEUDOHYPHAE FEW GRAM NEGATIVE COCCOBACILLI RARE GRAM NEGATIVE DIPLOCOCCI    Culture   Final    Consistent with normal respiratory flora. Performed at [facility]    Report Status 12/20/2015 FINAL  Final     Medications:   . acetylcysteine  4 mL Nebulization  TID  . atorvastatin  20 mg Oral QPM  . aztreonam  1 g Intravenous Q8H  . ciprofloxacin-dexamethasone  4 drop Both Ears BID  . clindamycin (CLEOCIN) IV  300 mg Intravenous Q8H  . fluticasone  2 spray Each Nare BID  . furosemide  20 mg Oral Daily  . guaiFENesin  600 mg Oral BID  . levalbuterol  0.63 mg Nebulization TID  . losartan  25 mg Oral Daily  . mouth rinse  15 mL Mouth Rinse BID  . mometasone-formoterol  2 puff Inhalation BID  . pantoprazole  40 mg Oral QAC breakfast  . polyethylene glycol  17 g Oral Daily  . senna-docusate  1 tablet Oral BID  . sodium chloride flush  3 mL Intravenous Q12H   Continuous Infusions: . DOPamine Stopped (12/24/15 0650)      LOS: 8 days    Time spent:  Talena Neira MD PhD  [facility] Pager [phone]  *Please refer to [email or web address], password [email or web address] to get updated schedule on who will round on this patient, as hospitalists switch teams weekly. If 7PM-7AM, please contact night-coverage at [email or web address], password [email or web address] for any overnight needs.  12/24/2015, 8:05 AM

## 2015-12-24 NOTE — Progress Notes (Signed)
Date:  December 24, 2015 Chart reviewed for concurrent status and case management needs. Will continue to follow the patient for status change: iv dopamine at time of review Discharge Planning: following for needs Expected discharge date: [id number] Marcelle SmilingRhonda Omunique [name], BSN, [email or web address], [place]   [phone]

## 2015-12-24 NOTE — Consult Note (Signed)
CARDIOLOGY CONSULT NOTE     Primary Care Physician: [name], MD Referring Physician:  Admit Date: 12/16/2015  Reason for consultation: tachy-brady syndrome  Gabrielle [name] is a 70 y.o. female with a h/o CAD (s/p CABG in 2010), paroxysmal atrial fibrillation (not on anticoagulation secondary to GI bleed in 10/2015), Stage 3 CKD, HLD, and bradycardia who presented to [facility] ED on 12/16/2015 for increased falls and generalized weakness.   On admission to the hospital, she was found to have a prolonged QTc of 602 ms. Her sotalol was thus stopped. She was also in acute on chronic diastolic heart failure requiring IV Lasix. She is currently -10 L. She went into atrial fibrillation with RVR and was put on diltiazem. She became bradycardic and she became bradycardic, and diltiazem was discontinued. She was started on low-dose dopamine and required pushes of atropine. Today, she remains in atrial fibrillation with rates in the 120s to 130s.  During this hospitalization, she was found to have a right middle lobe collapse on chest x-ray as well as CT. It was thought that this was likely due to mucus congestion from aspiration. It was thought that she would best be treated with chest physiotherapy and pulmonary toilet. She had an endoscopy which showed evidence of esophageal ulcers.   Past Medical History:  Diagnosis Date  . Anemia   . Arthritis   . Asthma   . Atrial fibrillation with controlled ventricular response (HCC) 11/2009   a. not on anticoagulation due to recurrent GI bleeding while on Eliquis.  . Chronic bronchitis   . Chronic diastolic CHF (congestive heart failure) (HCC)    a. Echo 12/2015: EF 55-60% w/ Grade 3 DD  . Chronic headache   . COPD (chronic obstructive pulmonary disease) (HCC)   . Depression   . Heart attack   . Hyperlipidemia   . Hypertension    Past Surgical History:  Procedure Laterality Date  . CAD-CABG     x7, brief post-op Atrial Fib  . CESAREAN SECTION       x2  . COLONOSCOPY WITH PROPOFOL N/A 10/24/2015   Procedure: COLONOSCOPY WITH PROPOFOL;  Surgeon: Carman Ching, MD;  Location: [facility] ENDOSCOPY;  Service: Endoscopy;  Laterality: N/A;  . CORONARY ARTERY BYPASS GRAFT  2011  . ESOPHAGOGASTRODUODENOSCOPY N/A 10/22/2015   Procedure: ESOPHAGOGASTRODUODENOSCOPY (EGD);  Surgeon: Carman Ching, MD;  Location: [facility] ENDOSCOPY;  Service: Endoscopy;  Laterality: N/A;  . JOINT REPLACEMENT Bilateral   . TONSILLECTOMY    . TOTAL KNEE ARTHROPLASTY      . atorvastatin  20 mg Oral QPM  . aztreonam  1 g Intravenous Q8H  . ciprofloxacin-dexamethasone  4 drop Both Ears BID  . clindamycin (CLEOCIN) IV  300 mg Intravenous Q8H  . fluticasone  2 spray Each Nare BID  . furosemide  20 mg Oral Daily  . guaiFENesin  600 mg Oral BID  . levalbuterol  0.63 mg Nebulization TID  . losartan  25 mg Oral Daily  . mouth rinse  15 mL Mouth Rinse BID  . mometasone-formoterol  2 puff Inhalation BID  . pantoprazole (PROTONIX) IV  40 mg Intravenous Q24H  . polyethylene glycol  17 g Oral Daily  . senna-docusate  1 tablet Oral BID  . sodium chloride flush  3 mL Intravenous Q12H  . sodium chloride HYPERTONIC  4 mL Nebulization Daily  . sucralfate  1 g Oral TID WC & HS   . heparin 750 Units/hr (12/24/15 1051)    Allergies  Allergen  Reactions  . Novocain [Procaine] Hives and Other (See Comments)    palpitations  . Penicillins Hives    Has patient had a PCN reaction causing immediate rash, facial/tongue/throat swelling, SOB or lightheadedness with hypotension: Yes Has patient had a PCN reaction causing severe rash involving mucus membranes or skin necrosis: No Has patient had a PCN reaction that required hospitalization No Has patient had a PCN reaction occurring within the last 10 years: No If all of the above answers are "NO", then may proceed with Cephalosporin use.    Social History   Social History  . Marital status: Divorced    Spouse name: N/A  . Number of  children: N/A  . Years of education: N/A   Occupational History  . retired Clinical biochemist   Social History Main Topics  . Smoking status: Former Smoker    Packs/day: 0.20    Types: Cigarettes  . Smokeless tobacco: Never Used  . Alcohol use No  . Drug use: No  . Sexual activity: Not on file   Other Topics Concern  . Not on file   Social History Narrative  . No narrative on file    Family History  Problem Relation Age of Onset  . Diabetes Mother   . Hypertension Mother   . Heart disease Mother     before age 69  . Heart disease Father     before age 30  . Hypertension Father   . Heart attack Father   . Lung cancer Maternal Grandfather   . Lung cancer Paternal Grandfather   . Hypertension Daughter   . Stroke Paternal Grandmother     ROS- All systems are reviewed and negative except as per the HPI above  Physical Exam: Telemetry: Vitals:   12/24/15 0910 12/24/15 0915 12/24/15 1000 12/24/15 1200  BP: (!) 160/94 (!) 163/62 (!) 108/57 96/63  Pulse:      Resp: (!) 27 18 19 19   Temp:    97.8 F (36.6 C)  TempSrc:    Oral  SpO2: (!) 88% (!) 89% 92% 100%  Weight:      Height:        GEN- The patient is well appearing, alert and oriented x 3 today.   Head- normocephalic, atraumatic Eyes-  Sclera clear, conjunctiva pink Ears- hearing intact Oropharynx- clear Neck- supple, no JVP Lymph- no cervical lymphadenopathy Lungs- Clear to ausculation bilaterally, normal work of breathing Heart- Tachycardic, irregular, no murmurs, rubs or gallops, PMI not laterally displaced GI- soft, NT, ND, + BS Extremities- no clubbing, cyanosis, or edema MS- no significant deformity or atrophy Skin- no rash or lesion Psych- euthymic mood, full affect Neuro- strength and sensation are intact  EKG: atrial fibrillation on telemetry, EKG performed yesterday shows sinus rhythm with rates in the 40s  Labs:   Lab Results  Component Value Date   WBC 12.7 (H) 12/24/2015   HGB  11.6 (L) 12/24/2015   HCT 36.3 12/24/2015   MCV 97.6 12/24/2015   PLT 137 (L) 12/24/2015    Recent Labs Lab 12/24/15 0326  NA 138  K 4.4  CL 105  CO2 26  BUN 17  CREATININE 0.89  CALCIUM 9.2  PROT 5.9*  BILITOT 1.7*  ALKPHOS 89  ALT 12*  AST 32  GLUCOSE 119*   Lab Results  Component Value Date   CKTOTAL 30 11/28/2009   CKMB 1.2 11/28/2009   TROPONINI <0.03 12/22/2015    Lab Results  Component Value Date  CHOL  11/28/2009    112        ATP III CLASSIFICATION:  <200     mg/dL   Desirable  161-096  mg/dL   Borderline High  >=045    mg/dL   High          Lab Results  Component Value Date   HDL 52 11/28/2009   Lab Results  Component Value Date   LDLCALC  11/28/2009    40        Total Cholesterol/HDL:CHD Risk Coronary Heart Disease Risk Table                     Men   Women  1/2 Average Risk   3.4   3.3  Average Risk       5.0   4.4  2 X Average Risk   9.6   7.1  3 X Average Risk  23.4   11.0        Use the calculated Patient Ratio above and the CHD Risk Table to determine the patient's CHD Risk.        ATP III CLASSIFICATION (LDL):  <100     mg/dL   Optimal  409-811  mg/dL   Near or Above                    Optimal  130-159  mg/dL   Borderline  914-782  mg/dL   High  >956     mg/dL   Very High   Lab Results  Component Value Date   TRIG 99 11/28/2009   Lab Results  Component Value Date   CHOLHDL 2.2 11/28/2009   No results found for: LDLDIRECT    Radiology: No acute cardiopulmonary abnormality seen.  Echo:  - Left ventricle: The cavity size was moderately dilated. Systolic   function was normal. The estimated ejection fraction was in the   range of 55% to 60%. Wall motion was normal; there were no   regional wall motion abnormalities. There was a reduced   contribution of atrial contraction to ventricular filling, due to   increased ventricular diastolic pressure or atrial contractile   dysfunction. Doppler parameters are consistent  with a reversible   restrictive pattern, indicative of decreased left ventricular   diastolic compliance and/or increased left atrial pressure (grade   3 diastolic dysfunction). Doppler parameters are consistent with   high ventricular filling pressure. - Mitral valve: Calcified annulus. The findings are consistent with   mild stenosis. There was mild to moderate regurgitation directed   eccentrically and posteriorly. Mean gradient (D): 3 mm Hg. Valve   area by pressure half-time: 1.42 cm^2. Valve area by continuity   equation (using LVOT flow): 1.6 cm^2. - Left atrium: The atrium was severely dilated. - Pulmonic valve: There was trivial regurgitation. - Pulmonary arteries: PA peak pressure: 57 mm Hg (S).  ASSESSMENT AND PLAN:   1. Tachy-Brady syndrome: Has had both tachycardia and bradycardia while on rate controlling medications. Due to her need for either beta blockers or calcium channel blockers to control her rates when she has atrial fibrillation, would plan for pacemaker placement. Risks and benefits of the procedure were discussed with the patient. Risks include, but are not limited to bleeding, infection, tamponade, pneumothorax. She understands these risks and has agreed to the procedure. As for the timing of the procedure, would prefer that she is stable from her multiple medical issues. Would plan to place the pacemaker  the day prior to planned discharge. Please call EP when discharge has been planned.   2. Persistent atrial fibrillation: Did not tolerate sotalol with a prolonged QTc. This is possibly due to her metabolic derangements, but it is unclear if this is the case. Her QTC has improved, but remains prolonged on her EKG performed yesterday. Would avoid any QT prolonging medications. At this time, due to her esophageal ulcerations, she is not an anticoagulation candidate, but would be in the future.  This patients CHA2DS2-VASc Score and unadjusted Ischemic Stroke Rate (% per  year) is equal to 4.8 % stroke rate/year from a score of 4  Above score calculated as 1 point each if present [CHF, HTN, DM, Vascular=MI/PAD/Aortic Plaque, Age if 65-74, or Female] Above score calculated as 2 points each if present [Age > 75, or Stroke/TIA/TE]   Sou Nohr Jorja LoaMartin Canuto Kingston, MD 12/24/2015  1:40 PM

## 2015-12-24 NOTE — H&P (View-Only) (Signed)
[facility] CONS[facility] Reason for consult: Abnormal Barium Swallow Referring Physician: [facility] hospitalist. PCP: Dr. Jillyn Hidden. Primary G.I.: [facility]  Gabrielle [name] is an 70 y.o. female.  HPI: she has he history of atrial fibrillation and has been on Eliquis for some time. We saw her 8/17 due to low-grade G.I. bleeding. She had previous colonoscopies in the past by Dr. Ewing Schlein. Her Eliquis was placed on hold and she had EGD colonoscopy 8/17. She's been on methotrexate for psoriasis, severe COPD followed by Dr. Delford Field, Robaxin for back pain along with tramadol, atrial fibrillation requiring chronic anticoagulation in history of congestive heart failure. EGD 8/17 was normal. Colonoscopy showed probable mucosa but no active bleeding and diverticulosis. The patient was admitted with shortness of breath and wheezing with chest x-ray showing congestion and was treated for a combination of COPD exacerbation, CHF and pneumonia. According to the patient and her family she has been somewhat anorexic for several months but has had no problems swallowing. Her son notes she came to his house and made a whole bowl of spaghetti without problems. The patient apparently was eating in the hospital regurgitated up some chicken became short of breath was moved to the ICU. All of this led to a barium swallow yesterday. She apparently ate a large breakfast of pancakes and eggs regurgitated some of this in the barium swallow was performed about 2 hours after that showing a lot of solid material in the esophagus. Were asked to see her regarding a possible obstructive esophagus. She has been on Eliquis until today and was made NPO after the barium swallow. A heparin drip was started. Careful discussion with the patient and family indicates no prior trouble swallowing until this hospitalization but she has had previous anorexia.  Past Medical History:  Diagnosis Date  . Anemia   . Arthritis   . Asthma   . Atrial  fibrillation with controlled ventricular response (HCC) 11/2009   a. not on anticoagulation due to recurrent GI bleeding while on Eliquis.  . Chronic bronchitis   . Chronic diastolic CHF (congestive heart failure) (HCC)    a. Echo 12/2015: EF 55-60% w/ Grade 3 DD  . Chronic headache   . COPD (chronic obstructive pulmonary disease) (HCC)   . Depression   . Heart attack   . Hyperlipidemia   . Hypertension     Past Surgical History:  Procedure Laterality Date  . CAD-CABG     x7, brief post-op Atrial Fib  . CESAREAN SECTION     x2  . COLONOSCOPY WITH PROPOFOL N/A 10/24/2015   Procedure: COLONOSCOPY WITH PROPOFOL;  Surgeon: Carman Ching, MD;  Location: [facility] ENDOSCOPY;  Service: Endoscopy;  Laterality: N/A;  . CORONARY ARTERY BYPASS GRAFT  2011  . ESOPHAGOGASTRODUODENOSCOPY N/A 10/22/2015   Procedure: ESOPHAGOGASTRODUODENOSCOPY (EGD);  Surgeon: Carman Ching, MD;  Location: [facility] ENDOSCOPY;  Service: Endoscopy;  Laterality: N/A;  . JOINT REPLACEMENT Bilateral   . TONSILLECTOMY    . TOTAL KNEE ARTHROPLASTY      Family History  Problem Relation Age of Onset  . Diabetes Mother   . Hypertension Mother   . Heart disease Mother     before age 56  . Heart disease Father     before age 81  . Hypertension Father   . Heart attack Father   . Lung cancer Maternal Grandfather   . Lung cancer Paternal Grandfather   . Hypertension Daughter   . Stroke Paternal Grandmother     Social History:  reports  that she has quit smoking. Her smoking use included Cigarettes. She smoked 0.20 packs per day. She has never used smokeless tobacco. She reports that she does not drink alcohol or use drugs.  Allergies:  Allergies  Allergen Reactions  . Novocain [Procaine] Hives and Other (See Comments)    palpitations  . Penicillins Hives    Has patient had a PCN reaction causing immediate rash, facial/tongue/throat swelling, SOB or lightheadedness with hypotension: Yes Has patient had a PCN reaction causing  severe rash involving mucus membranes or skin necrosis: No Has patient had a PCN reaction that required hospitalization No Has patient had a PCN reaction occurring within the last 10 years: No If all of the above answers are "NO", then may proceed with Cephalosporin use.    Medications; Prior to Admission medications   Medication Sig Start Date End Date Taking? Authorizing Provider  albuterol (PROAIR HFA) 108 (90 BASE) MCG/ACT inhaler INHALE 2 PUFFS INTO THE LUNGS EVERY 6 (SIX) HOURS AS NEEDED. Patient taking differently: Inhale 2 puffs into the lungs every 4 (four) hours as needed for wheezing or shortness of breath. INHALE 2 PUFFS INTO THE LUNGS EVERY 6 (SIX) HOURS AS NEEDED. 01/15/14  Yes Elsie Stain, MD  ALPRAZolam Duanne Moron) 0.5 MG tablet Take 1 tablet (0.5 mg total) by mouth 2 (two) times daily as needed for anxiety. 10/30/15  Yes Clanford Marisa Hua, MD  atorvastatin (LIPITOR) 20 MG tablet TAKE 1 TABLET (20 MG TOTAL) BY MOUTH DAILY. Patient taking differently: Take 20 mg by mouth every evening.  11/01/15  Yes Jerline Pain, MD  budesonide-formoterol [facility]) 160-4.5 MCG/ACT inhaler Inhale 2 puffs into the lungs 2 (two) times daily. 01/15/14  Yes Elsie Stain, MD  citalopram (CELEXA) 20 MG tablet Take 20 mg by mouth 2 (two) times daily.  11/28/15  Yes Historical Provider, MD  clindamycin (CLEOCIN) 300 MG capsule Take 300 mg by mouth 2 (two) times daily. 12/10/15  Yes Historical Provider, MD  clobetasol cream (TEMOVATE) 5.95 % Apply 1 application topically daily as needed (itching).    Yes Historical Provider, MD  docusate sodium (COLACE) 100 MG capsule Take 100 mg by mouth daily as needed for moderate constipation.    Yes Historical Provider, MD  furosemide (LASIX) 20 MG tablet Take 1 tablet (20 mg total) by mouth daily. May take additional 20 mg a day for swelling as needed Patient taking differently: Take 20 mg by mouth daily. May take additional 20 mg a day for as needed for  swelling. 09/26/15  Yes Jerline Pain, MD  losartan (COZAAR) 50 MG tablet TAKE 1 TABLET BY MOUTH EVERY DAY Patient taking differently: TAKE '50mg'$  TABLET BY MOUTH ONCE DAILY 11/11/15  Yes Jerline Pain, MD  oxymetazoline (AFRIN) 0.05 % nasal spray Place 2 sprays into both nostrils 2 (two) times daily as needed for congestion.    Yes Historical Provider, MD  pantoprazole (PROTONIX) 40 MG tablet Take 40 mg by mouth daily before breakfast.  10/10/14  Yes Historical Provider, MD  Polyethyl Glycol-Propyl Glycol (SYSTANE ULTRA) 0.4-0.3 % SOLN Place 1 drop into both eyes daily as needed (for dry eyes).    Yes Historical Provider, MD  polyethylene glycol (MIRALAX / GLYCOLAX) packet Take 17 g by mouth daily as needed for mild constipation. 10/30/15  Yes Clanford Marisa Hua, MD  sotalol (BETAPACE) 80 MG tablet Take 1 tablet (80 mg total) by mouth daily. 11/27/15  Yes Jerline Pain, MD  tiZANidine (ZANAFLEX) 4 MG tablet  Take 4 mg by mouth at bedtime as needed for muscle spasms.   Yes Historical Provider, MD  traMADol (ULTRAM) 50 MG tablet Take 1 tablet (50 mg total) by mouth every 6 (six) hours as needed for moderate pain. 10/30/15  Yes Clanford Marisa Hua, MD  fluticasone (FLONASE) 50 MCG/ACT nasal spray Place 2 sprays into both nostrils daily. 12/21/15   Florencia Reasons, MD  guaiFENesin (MUCINEX) 600 MG 12 hr tablet Take 1 tablet (600 mg total) by mouth 2 (two) times daily. 12/20/15   Florencia Reasons, MD   . acetylcysteine  4 mL Nebulization TID  . atorvastatin  20 mg Oral QPM  . atropine  0.5 mg Intravenous Once  . aztreonam  1 g Intravenous Q8H  . ciprofloxacin-dexamethasone  4 drop Both Ears BID  . clindamycin (CLEOCIN) IV  300 mg Intravenous Q8H  . fluticasone  2 spray Each Nare BID  . furosemide  20 mg Oral Daily  . guaiFENesin  600 mg Oral BID  . levalbuterol  0.63 mg Nebulization TID  . losartan  25 mg Oral Daily  . mouth rinse  15 mL Mouth Rinse BID  . mometasone-formoterol  2 puff Inhalation BID  . pantoprazole  40  mg Oral QAC breakfast  . polyethylene glycol  17 g Oral Daily  . senna-docusate  1 tablet Oral BID  . sodium chloride flush  3 mL Intravenous Q12H   PRN Meds acetaminophen **OR** acetaminophen, ALPRAZolam, hydrALAZINE, morphine injection, ondansetron **OR** ondansetron (ZOFRAN) IV, polyvinyl alcohol, tiZANidine, traMADol Results for orders placed or performed during the hospital encounter of 12/16/15 (from the past 48 hour(s))  Troponin I (q 6hr x 3)     Status: None   Collection Time: 12/21/15  5:55 PM  Result Value Ref Range   Troponin I <0.03 <0.03 ng/mL  Troponin I (q 6hr x 3)     Status: None   Collection Time: 12/22/15 12:05 AM  Result Value Ref Range   Troponin I <0.03 <0.03 ng/mL  CBC     Status: Abnormal   Collection Time: 12/22/15  5:31 AM  Result Value Ref Range   WBC 11.9 (H) 4.0 - 10.5 K/uL   RBC 3.43 (L) 3.87 - 5.11 MIL/uL   Hemoglobin 10.9 (L) 12.0 - 15.0 g/dL   HCT 32.9 (L) 36.0 - 46.0 %   MCV 95.9 78.0 - 100.0 fL   MCH 31.8 26.0 - 34.0 pg   MCHC 33.1 30.0 - 36.0 g/dL   RDW 18.6 (H) 11.5 - 15.5 %   Platelets 125 (L) 150 - 400 K/uL  Comprehensive metabolic panel     Status: Abnormal   Collection Time: 12/22/15  5:31 AM  Result Value Ref Range   Sodium 139 135 - 145 mmol/L   Potassium 4.4 3.5 - 5.1 mmol/L   Chloride 100 (L) 101 - 111 mmol/L   CO2 33 (H) 22 - 32 mmol/L   Glucose, Bld 123 (H) 65 - 99 mg/dL   BUN 17 6 - 20 mg/dL   Creatinine, Ser 0.95 0.44 - 1.00 mg/dL   Calcium 9.4 8.9 - 10.3 mg/dL   Total Protein 5.6 (L) 6.5 - 8.1 g/dL   Albumin 3.1 (L) 3.5 - 5.0 g/dL   AST 31 15 - 41 U/L   ALT 10 (L) 14 - 54 U/L   Alkaline Phosphatase 83 38 - 126 U/L   Total Bilirubin 1.7 (H) 0.3 - 1.2 mg/dL   GFR calc non Af Amer 58 (L) >60  mL/min   GFR calc Af Amer >60 >60 mL/min    Comment: (NOTE) The eGFR has been calculated using the CKD EPI equation. This calculation has not been validated in all clinical situations. eGFR's persistently <60 mL/min signify possible  Chronic Kidney Disease.    Anion gap 6 5 - 15  CBC     Status: Abnormal   Collection Time: 12/23/15  6:01 AM  Result Value Ref Range   WBC 11.7 (H) 4.0 - 10.5 K/uL   RBC 3.62 (L) 3.87 - 5.11 MIL/uL   Hemoglobin 11.2 (L) 12.0 - 15.0 g/dL   HCT 61.9 (L) 68.9 - 10.1 %   MCV 98.6 78.0 - 100.0 fL   MCH 30.9 26.0 - 34.0 pg   MCHC 31.4 30.0 - 36.0 g/dL   RDW 91.9 (H) 91.8 - 11.2 %   Platelets 125 (L) 150 - 400 K/uL  Comprehensive metabolic panel     Status: Abnormal   Collection Time: 12/23/15  6:01 AM  Result Value Ref Range   Sodium 139 135 - 145 mmol/L   Potassium 4.5 3.5 - 5.1 mmol/L   Chloride 103 101 - 111 mmol/L   CO2 30 22 - 32 mmol/L   Glucose, Bld 134 (H) 65 - 99 mg/dL   BUN 21 (H) 6 - 20 mg/dL   Creatinine, Ser 7.93 (H) 0.44 - 1.00 mg/dL   Calcium 9.0 8.9 - 68.2 mg/dL   Total Protein 5.7 (L) 6.5 - 8.1 g/dL   Albumin 3.2 (L) 3.5 - 5.0 g/dL   AST 32 15 - 41 U/L   ALT 11 (L) 14 - 54 U/L   Alkaline Phosphatase 80 38 - 126 U/L   Total Bilirubin 2.0 (H) 0.3 - 1.2 mg/dL   GFR calc non Af Amer 44 (L) >60 mL/min   GFR calc Af Amer 51 (L) >60 mL/min    Comment: (NOTE) The eGFR has been calculated using the CKD EPI equation. This calculation has not been validated in all clinical situations. eGFR's persistently <60 mL/min signify possible Chronic Kidney Disease.    Anion gap 6 5 - 15  Magnesium     Status: Abnormal   Collection Time: 12/23/15  6:01 AM  Result Value Ref Range   Magnesium 1.6 (L) 1.7 - 2.4 mg/dL  Urinalysis, Routine w reflex microscopic (not at [facility])     Status: Abnormal   Collection Time: 12/23/15 10:59 AM  Result Value Ref Range   Color, Urine AMBER (A) YELLOW    Comment: BIOCHEMICALS MAY BE AFFECTED BY COLOR   APPearance CLOUDY (A) CLEAR   Specific Gravity, Urine 1.017 1.005 - 1.030   pH 6.0 5.0 - 8.0   Glucose, UA NEGATIVE NEGATIVE mg/dL   Hgb urine dipstick LARGE (A) NEGATIVE   Bilirubin Urine NEGATIVE NEGATIVE   Ketones, ur NEGATIVE NEGATIVE  mg/dL   Protein, ur NEGATIVE NEGATIVE mg/dL   Nitrite NEGATIVE NEGATIVE   Leukocytes, UA SMALL (A) NEGATIVE  Urine microscopic-add on     Status: Abnormal   Collection Time: 12/23/15 10:59 AM  Result Value Ref Range   Squamous Epithelial / LPF 0-5 (A) NONE SEEN   WBC, UA 6-30 0 - 5 WBC/hpf   RBC / HPF TOO NUMEROUS TO COUNT 0 - 5 RBC/hpf   Bacteria, UA FEW (A) NONE SEEN    Dg Chest 2 View  Result Date: 12/21/2015 CLINICAL DATA:  New onset productive cough this morning with shortness-of-breath and weakness. Mild hypoxia. EXAM: CHEST  2  VIEW COMPARISON:  12/16/2015, 11/27/2009 and chest CT 12/18/2015 FINDINGS: Median sternotomy wires are unchanged. Lungs are adequately inflated with minimal linear scarring over the left midlung and left base unchanged. Hazy opacification over the right middle lobe without significant change. Mild stable cardiomegaly. Calcified plaque is present over the aortic arch. There are degenerative changes of the spine. IMPRESSION: Hazy opacification over the right middle lobe unchanged and may be due to atelectasis or infection. Mild stable cardiomegaly. Aortic atherosclerosis. Electronically Signed   By: Marin Olp M.D.   On: 12/21/2015 16:00   Ct Chest Wo Contrast  Result Date: 12/22/2015 CLINICAL DATA:  Cough.  Shortness of breath. EXAM: CT CHEST WITHOUT CONTRAST TECHNIQUE: Multidetector CT imaging of the chest was performed following the standard protocol without IV contrast. COMPARISON:  12/21/2015 radiograph and 12/18/2015 chest CT FINDINGS: Cardiovascular: Coronary, aortic arch, and branch vessel atherosclerotic vascular disease. Moderate cardiomegaly. Prior CABG. Prominent main pulmonary artery. Mediastinum/Nodes: Small mediastinal lymph nodes are not pathologically enlarged by size criteria. Lungs/Pleura: Compared 4 days ago, the is a new 4 mm right upper lobe nodule on image 37 of series 4 which is likely inflammatory given that it was not present previously.  There is some scattered atelectasis in the right upper lobe along with some notable bilateral airway thickening. There is new complete atelectasis of the right middle lobe with new filling or occlusion of the right middle lobe bronchus. There is occlusion of segmental bronchi in the right lower lobe with infrahilar atelectasis and bandlike densities. Similar scarring or atelectasis in the left upper lobe compared to the prior exam. Mildly increased atelectasis in the left lower lobe. Upper Abdomen: New mild perihepatic ascites. Musculoskeletal: Unremarkable IMPRESSION: 1. Worsening bilateral airway thickening, with new complete occlusion of the right middle lobe bronchus and complete atelectasis of the right middle lobe, and segmental bronchi inclusion in the right lower lobe with increasing right infrahilar atelectasis. There is also mildly increased left lower lobe atelectasis. 2. New mild perihepatic ascites, cause uncertain. 3. Coronary, aortic arch, and branch vessel atherosclerotic vascular disease. Moderate cardiomegaly. Prominent main pulmonary artery suggesting pulmonary arterial hypertension. Prior CABG. Electronically Signed   By: Van Clines M.D.   On: 12/22/2015 19:14   Dg Esophagus  Addendum Date: 12/23/2015   ADDENDUM REPORT: 12/23/2015 13:51 ADDENDUM: These results were called by telephone at the time of interpretation on 12/23/2015 at 11:51 am to Dr. Florencia Reasons , who verbally acknowledged these results. Electronically Signed   By: Rolm Baptise M.D.   On: 12/23/2015 13:51   Result Date: 12/23/2015 CLINICAL DATA:  Difficulty swallowing, possible aspiration. EXAM: ESOPHOGRAM/BARIUM SWALLOW TECHNIQUE: Single contrast examination was performed using  thin barium. FLUOROSCOPY TIME:  Fluoroscopy Time:  1 minutes 12 seconds Radiation Exposure Index (if provided by the fluoroscopic device): 8.5 mGy Number of Acquired Spot Images: 1 COMPARISON:  CT 12/22/2015 FINDINGS: There is near complete  obstruction of the distal esophagus with irregular filling defects. A small amount of contrast finally passes through the distal esophagus. Findings concerning for food impaction. Full column stasis of contrast above the distal obstruction with very slow emptying of the esophagus. IMPRESSION: Near complete obstruction of the distal esophagus with filling defects and a very slow trickle of contrast through the distal esophagus into the stomach. Findings concerning for fecal impaction. Cannot exclude partially obstructing underlying distal mass. Recommend further evaluation with endoscopy. Electronically Signed: By: Rolm Baptise M.D. On: 12/23/2015 12:20  Blood pressure (!) 130/37, pulse 76, temperature 97.5 F (36.4 C), temperature source Oral, resp. rate 14, height '5\' 2"'$  (1.575 m), weight 76.5 kg (168 lb 10.4 oz), SpO2 99 %.  Physical exam:   General-- pleasant white female alert no distress with no obvious difficulty breathing. ENT-- grossly normal throat with  dry mucous membranes Neck-- supple Heart-- somewhat tachycardic Lungs-- grossly clear anteriorly and posteriorly Abdomen-- soft and nontender Psych-- oriented and answers questions appropriately.   Assessment: 1. Dysphagia. The patient had not had any problems swallowing until recently. The barium swallow was obtained after eating pancakes and scrambled eggs and a lot of the material may be residual breakfast. She swallowing saliva not regurgitating anything currently. I think EGD would be appropriate but she's only been off Eliquis one day. This may all be connected with her right middle lobe collapse 2. RML collapse. Being followed by pulmonary could be result of mucus plugging or aspiration based on her dysphagia. If not improved may need bronchoscopy, it currently has a O2 sat >90% on minimal supplemental oxygen. 3. Atrial fibrillation on chronic anticoagulation 4. Recent G.I. bleed unclear source may have  been diverticular in origin 5. History of chronic congestive heart failure  Plan:  1. We will continue to hold the patient's Eliquis and would prefer that she be off of Eliquis at least 2 days prior to endoscopic evaluation. Heparin drip is started and we will hold this for 2 hours prior to endoscopy. 2. Endoscopy is planned for 9 AM tomorrow and will be performed with moderate sedation. Based on the normal esophagus on previous endoscopy suspect that the foreign body/food material will be passed. Have discussed all of the above with the patient and family.   Jashua Knaak JR,Quashaun Lazalde L 12/23/2015, 2:47 PM   This note was created using voice recognition software and minor errors may Have occurred unintentionally. Pager: [phone] If no answer or after hours call [phone]

## 2015-12-24 NOTE — Op Note (Signed)
[facility] Patient Name: Gabrielle [name] Procedure Date: 12/24/2015 MRN: [id number] Attending MD: Tresea Mall Dr., MD Date of Birth: [birth year] CSN: [id number] Age: 70 Admit Type: Inpatient Procedure:                Upper GI endoscopy with brushing of the distal                            esophagus for fungal smear Indications:              Dysphagia.barium swallow obtained yesterday showed                            food material in the distal esophagus but was                            obtained after the patient had been eating                            breakfast. EGD several months ago revealed normal                            esophagus Providers:                Fayrene Fearing L. Tec[address]seh Ye[address] Dr., MD, Waynard Pericles Carmicheal RN, RN,                            Beryle Beams, Technician Referring MD:             critical care medicine Medicines:                Fentanyl 50 micrograms IV, Midazolam 4 mg IV no                            oral spray was used since the patient reportsan                            allergy to Novocain. Complications:            No immediate complications. Estimated Blood Loss:     Estimated blood loss was minimal. Procedure:                Pre-Anesthesia Assessment:                           - Prior to the procedure, a History and Physical                            was performed, and patient medications and                            allergies were reviewed. The patient's tolerance of                            previous anesthesia was also reviewed. The risks  and benefits of the procedure and the sedation                            options and risks were discussed with the patient.                            All questions were answered, and informed consent                            was obtained. Prior Anticoagulants: The patient has                            taken heparin, last dose was 2 hours before the         procedure. ASA Grade Assessment: III - A patient                            with severe systemic disease. After reviewing the                            risks and benefits, the patient was deemed in                            satisfactory condition to undergo the procedure.                           After obtaining informed consent, the endoscope was                            passed under direct vision. Throughout the                            procedure, the patient's blood pressure, pulse, and                            oxygen saturations were monitored continuously. The                            [id number]-[id number] ([id number]) scope was introduced through the                            mouth, and advanced to the second part of duodenum.                            The upper GI endoscopy was accomplished without                            difficulty. The patient tolerated the procedure                            well. Scope In: Scope Out: Findings:      Few superficial esophageal ulcers with oozing blood and stigmata of       recent bleeding were found at the gastroesophageal junction. Cells for  cytology were obtained by brushing. this was sent for fungal smear. The       remainder the esophagus was normal. Of note, the GE junction was widely       [place] and there was no food material in the esophagus.      The entire examined stomach was normal.      The examined duodenum was normal. Impression:               - Bleeding esophageal ulcers. Cells for cytology                            obtained. brushing sent for fungal smear. Suspect                            this is reflux induced ulceration.                           - Normal stomach.                           - Normal examined duodenum. Moderate Sedation:      Moderate (conscious) sedation was administered by the endoscopy nurse       and supervised by the endoscopist. The following parameters were       monitored: oxygen  saturation, heart rate, blood pressure, respiratory       rate, EKG, adequacy of pulmonary ventilation, and response to care. Recommendation:           - Patient has a contact number available for                            emergencies. The signs and symptoms of potential                            delayed complications were discussed with the                            patient. Return to normal activities tomorrow.                            Written discharge instructions were provided to the                            patient.                           - Clear liquid diet.                           - Use sucralfate suspension 1 gram PO QID.                           - Repeat upper endoscopy in 2 months to check                            healing. the patient tolerated this procedure well  but repeat procedure will need to utilize propofol                            sedation.                           - Resume heparin at prior dose today. Procedure Code(s):        --- Professional ---                           [phone], Esophagogastroduodenoscopy, flexible,                            transoral; diagnostic, including collection of                            specimen(s) by brushing or washing, when performed                            (separate procedure) Diagnosis Code(s):        --- Professional ---                           K22.11, Ulcer of esophagus with bleeding                           R13.10, Dysphagia, unspecified CPT copyright 2016 [employer]. All rights reserved. The codes documented in this report are preliminary and upon coder review may  be revised to meet current compliance requirements. Tresea MallJames L Camauri Craton Dr., MD 12/24/2015 9:41:22 AM This report has been signed electronically. Number of Addenda: 0

## 2015-12-24 NOTE — Progress Notes (Signed)
Physical Therapy Treatment Patient Details Name: Gabrielle [name] MRN: [id number] DOB: 1945/04/09 Today's Date: 12/24/2015    History of Present Illness Pt is a 70 y.o. female with PMH of CAD status post CABG, paroxysmal atrial fibrillation, chronic systolic heart failure, COPD, hypertension, hyperlipidemia admitted for acute respiratory failure with hypoxia. During current hospitalization, pt taken to the ICU for bradycardia, hypotension and right middle lobe collapse.     PT Comments    Pt reported nausea with supine to sitting on EOB, subsided in approx 1-2 min after adjusting to position. Pt able to perform bed mobility, transfers, and gait (in pt's room) with min guard. Vitals monitored throughout visit. HR remained approx 75 bpm. On 3L O2, SpO2 remained at 97%. At rest in supine, BP 111/53; at EOB, BP 121/53; post-gait, BP 117/45. Pt reported being agreeable to continue mobility with PT and nursing. Pt eager to be up and mobile.  Follow Up Recommendations  Home health PT;Supervision for mobility/OOB (will reassess dependent upon pt progress post-ICU)     Equipment Recommendations  None recommended by PT    Recommendations for Other Services       Precautions / Restrictions Precautions Precautions: Fall;Back Precaution Comments: log rolling for safety secondary to acute L3 and hx of thoracic vertebral compression fxs Restrictions Weight Bearing Restrictions: No    Mobility  Bed Mobility Overal bed mobility: Needs Assistance Bed Mobility: Rolling;Sidelying to Sit;Sit to Sidelying Rolling: Min guard Sidelying to sit: Min guard       General bed mobility comments: guarding for safety due to increased lines and leads, monitoring vitral signs upon ICU admission; increased time required to perform bed mobility safely and with correct technique  Transfers Overall transfer level: Needs assistance Equipment used: None Transfers: Sit to/from [employer] Sit  to Stand: Min guard Stand pivot transfers: Min guard       General transfer comment: guarding for safety due to increased lines and leads, monitoring vitals signs upon admission to ICU; no verbal cues required for safe technique  Ambulation/Gait Ambulation/Gait assistance: Min guard Ambulation Distance (Feet): 10 Feet Assistive device: None Gait Pattern/deviations: Step-through pattern;Decreased stride length Gait velocity: decreased   General Gait Details: guarding for safety due to increased lines and leads, monitoring vital signs upon admission to ICU; pt used bed rails for balance with ambulation in room; no verbal cues required for safe technique   Stairs            Wheelchair Mobility    Modified Rankin (Stroke Patients Only)       Balance                                    Cognition Arousal/Alertness: Awake/alert Behavior During Therapy: WFL for tasks assessed/performed Overall Cognitive Status: Within Functional Limits for tasks assessed                      Exercises      General Comments        Pertinent Vitals/Pain Pain Assessment: 0-10 Pain Score:  (does not rate) Pain Location: under L breast and midline of chest Pain Descriptors / Indicators: Discomfort Pain Intervention(s): Limited activity within patient's tolerance;Monitored during session    Home Living                      Prior Function  PT Goals (current goals can now be found in the care plan section) Progress towards PT goals: Progressing toward goals    Frequency    Min 3X/week      PT Plan Current plan remains appropriate    Co-evaluation             End of Session Equipment Utilized During Treatment: Oxygen Activity Tolerance: Patient tolerated treatment well Patient left: with call bell/phone within reach;in chair;with chair alarm set     Time: 1413-1433 PT Time Calculation (min) (ACUTE ONLY): 20 min  Charges:   $Therapeutic Activity: 8-22 mins                    G CodesKaralee [name]:      Gabrielle [name] 12/24/2015, 3:58 PM Gabrielle Gabrielle [name], SPT

## 2015-12-24 NOTE — Progress Notes (Signed)
ANTICOAGULATION CONSULT NOTE - Follow Up Consult  Pharmacy Consult for Heparin Indication: Afib, Eliquis is held  Allergies  Allergen Reactions  . Novocain [Procaine] Hives and Other (See Comments)    palpitations  . Penicillins Hives    Has patient had a PCN reaction causing immediate rash, facial/tongue/throat swelling, SOB or lightheadedness with hypotension: Yes Has patient had a PCN reaction causing severe rash involving mucus membranes or skin necrosis: No Has patient had a PCN reaction that required hospitalization No Has patient had a PCN reaction occurring within the last 10 years: No If all of the above answers are "NO", then may proceed with Cephalosporin use.    Patient Measurements: Height: 5\' 2"  (157.5 cm) Weight: 168 lb 10.4 oz (76.5 kg) IBW/kg (Calculated) : 50.1 Heparin Dosing Weight:   Vital Signs: Temp: 98.5 F (36.9 C) (10/24 0029) Temp Source: Oral (10/24 0029) BP: 104/35 (10/24 0100) Pulse Rate: 70 (10/23 1700)  Labs:  Recent Labs  12/21/15 0533 12/21/15 1123 12/21/15 1755 12/22/15 0005 12/22/15 0531 12/23/15 0601 12/24/15 0014 12/24/15 0017  HGB 10.8*  --   --   --  10.9* 11.2*  --   --   HCT 33.3*  --   --   --  32.9* 35.7*  --   --   PLT 118*  --   --   --  125* 125*  --   --   APTT  --   --   --   --   --   --  163*  --   HEPARINUNFRC  --   --   --   --   --   --   --  >2.20*  CREATININE 1.06*  --   --   --  0.95 1.21*  --   --   TROPONINI  --  <0.03 <0.03 <0.03  --   --   --   --     Estimated Creatinine Clearance: 40.3 mL/min (by C-G formula based on SCr of 1.21 mg/dL (H)).   Medications:  Infusions:  . DOPamine 10 mcg/kg/min (12/23/15 1729)  . heparin 950 Units/hr (12/23/15 1611)    Assessment: Patient with high heparin level and high PTT.  Heparin level is believed to be due to prior apixaban dosing.  PTT is being used for monitoring heparin drip at this time.  No heparin issues per RN.    Goal of Therapy:  Heparin level  0.3-0.7 units/ml aPTT 66-102 seconds Monitor platelets by anticoagulation protocol: Yes   Plan:  Decrease heparin to 850 units/hr Continue with prior d/c of drip at 0700 as planned.   No PTT due to planned d/c of drip Will follow up with anticoag plan later today.  Darlina GuysGrimsley Jr, Jacquenette ShoneJulian Crowford 12/24/2015,1:58 AM

## 2015-12-24 NOTE — Progress Notes (Signed)
Name: Gabrielle PalauVirginia W Cleaves MRN: [id number] DOB: August 18, 1945    ADMISSION DATE:  12/16/2015 CONSULTATION DATE:  12/23/15  REFERRING MD :  Dr. Roda ShuttersXu  CHIEF COMPLAINT:  Pneumonia, difficulty swallowing > ? aspiration  SUBJECTIVE:  Feels better  VITAL SIGNS: Temp:  [97.8 F (36.6 C)-98.7 F (37.1 C)] 97.8 F (36.6 C) (10/24 0800) Pulse Rate:  [70-103] 98 (10/24 0851) Resp:  [9-28] 19 (10/24 0851) BP: (92-169)/(26-97) 118/33 (10/24 0851) SpO2:  [90 %-99 %] 95 % (10/24 0851) Weight:  [168 lb 3.4 oz (76.3 kg)] 168 lb 3.4 oz (76.3 kg) (10/24 0545) 2 liters  PHYSICAL EXAMINATION: General:  Chronically ill appearing female in NAD  Neuro:  AAOx4, speech clear, MAE  HEENT:  MM pink/moist, no jvd Cardiovascular:  s1s2 rrr, systolic murmur noted at LSB Lungs:  Even/non-labored on Vance O2; Decreased on right. Scattered rhonchi  Abdomen:  Obese/soft, bsx4 active  Musculoskeletal:  No acute deformities  Skin:  Warm/dry, no edema    Recent Labs Lab 12/22/15 0531 12/23/15 0601 12/24/15 0326  NA 139 139 138  K 4.4 4.5 4.4  CL 100* 103 105  CO2 33* 30 26  BUN 17 21* 17  CREATININE 0.95 1.21* 0.89  GLUCOSE 123* 134* 119*    Recent Labs Lab 12/22/15 0531 12/23/15 0601 12/24/15 0326  HGB 10.9* 11.2* 11.6*  HCT 32.9* 35.7* 36.3  WBC 11.9* 11.7* 12.7*  PLT 125* 125* 137*   Ct Chest Wo Contrast  Result Date: 12/22/2015 CLINICAL DATA:  Cough.  Shortness of breath. EXAM: CT CHEST WITHOUT CONTRAST TECHNIQUE: Multidetector CT imaging of the chest was performed following the standard protocol without IV contrast. COMPARISON:  12/21/2015 radiograph and 12/18/2015 chest CT FINDINGS: Cardiovascular: Coronary, aortic arch, and branch vessel atherosclerotic vascular disease. Moderate cardiomegaly. Prior CABG. Prominent main pulmonary artery. Mediastinum/Nodes: Small mediastinal lymph nodes are not pathologically enlarged by size criteria. Lungs/Pleura: Compared 4 days ago, the is a new 4 mm right  upper lobe nodule on image 37 of series 4 which is likely inflammatory given that it was not present previously. There is some scattered atelectasis in the right upper lobe along with some notable bilateral airway thickening. There is new complete atelectasis of the right middle lobe with new filling or occlusion of the right middle lobe bronchus. There is occlusion of segmental bronchi in the right lower lobe with infrahilar atelectasis and bandlike densities. Similar scarring or atelectasis in the left upper lobe compared to the prior exam. Mildly increased atelectasis in the left lower lobe. Upper Abdomen: New mild perihepatic ascites. Musculoskeletal: Unremarkable IMPRESSION: 1. Worsening bilateral airway thickening, with new complete occlusion of the right middle lobe bronchus and complete atelectasis of the right middle lobe, and segmental bronchi inclusion in the right lower lobe with increasing right infrahilar atelectasis. There is also mildly increased left lower lobe atelectasis. 2. New mild perihepatic ascites, cause uncertain. 3. Coronary, aortic arch, and branch vessel atherosclerotic vascular disease. Moderate cardiomegaly. Prominent main pulmonary artery suggesting pulmonary arterial hypertension. Prior CABG. Electronically Signed   By: Gaylyn RongWalter  Liebkemann M.D.   On: 12/22/2015 19:14   Dg Esophagus  Addendum Date: 12/23/2015   ADDENDUM REPORT: 12/23/2015 13:51 ADDENDUM: These results were called by telephone at the time of interpretation on 12/23/2015 at 11:51 am to Dr. Albertine GratesFANG XU , who verbally acknowledged these results. Electronically Signed   By: Charlett NoseKevin  Dover M.D.   On: 12/23/2015 13:51   Result Date: 12/23/2015 CLINICAL DATA:  Difficulty swallowing, possible aspiration.  EXAM: ESOPHOGRAM/BARIUM SWALLOW TECHNIQUE: Single contrast examination was performed using  thin barium. FLUOROSCOPY TIME:  Fluoroscopy Time:  1 minutes 12 seconds Radiation Exposure Index (if provided by the fluoroscopic  device): 8.5 mGy Number of Acquired Spot Images: 1 COMPARISON:  CT 12/22/2015 FINDINGS: There is near complete obstruction of the distal esophagus with irregular filling defects. A small amount of contrast finally passes through the distal esophagus. Findings concerning for food impaction. Full column stasis of contrast above the distal obstruction with very slow emptying of the esophagus. IMPRESSION: Near complete obstruction of the distal esophagus with filling defects and a very slow trickle of contrast through the distal esophagus into the stomach. Findings concerning for fecal impaction. Cannot exclude partially obstructing underlying distal mass. Recommend further evaluation with endoscopy. Electronically Signed: By: Charlett Nose M.D. On: 12/23/2015 12:20   SIGNIFICANT EVENTS  10/16  Admit with hypoxic respiratory failure, decompensated CHF  10/24 EGD: negative for obstruction. Ulcerative esophagitis.   STUDIES:  CT Chest 10/22 >> worsening bilateral airway thickening, new complete occlusion of RML and complete atelectasis of the RML, increased infrahilar atelectasis  ECHO 10/17 >> LV moderately dilated, LVEF 55-60%, no RWMA, grade III diastolic dysfunction, LA severely dilated, trivial pulmonic regurgitation, PA peak 57 (RV systolic pressure increased c/w moderate PH) Esophogram / barium swallow 10/23 >> near complete obstruction of the distal esophagus (worrisome for fecal impaction).   ASSESSMENT / PLAN:  RML Collapse / Complete Atelectasis - possible mucus plugging vs aspirated food material - cont xopenex; add hypertonic saline nebs - repeat CXR today.  - Reflux and aspiration precautions.  - flutter valve - pulmonary hygiene as able - intermittent CXR  - no role for FOB at this time, if RML does not clear will consider inspection with FOB  - clinda / aztreonam per primary.  Consider adjustment to monotherapy levaquin vs cephalosporin trial for aspiration coverage   - if No change in  CXR today we will s/o   COPD Tobacco Abuse  - smoking cessation counseling  - continue xopenex TID  - continue Dulera  Acute Hypoxic Respiratory Failure secondary to RML Collapse & underlying COPD Moderate PH - likely secondary  - O2 as needed to support sats > 90% - will need ambulatory O2 needs assessment prior to discharge   Decompensated CHF  - lasix per Cardiology  Ulcerative esophagitis  Esophageal Impaction  -hold eliquis another 24 hrs -start liquids, cont PPI and add carafate    Simonne Martinet ACNP-BC [facility] Pulmonary/Critical Care Pager # [phone] OR # [phone] if no answer

## 2015-12-24 NOTE — Progress Notes (Signed)
ANTICOAGULATION CONSULT NOTE - Follow up Consult  Pharmacy Consult for Heparin Indication: Afib, Eliquis is held  Allergies  Allergen Reactions  . Novocain [Procaine] Hives and Other (See Comments)    palpitations  . Penicillins Hives    Has patient had a PCN reaction causing immediate rash, facial/tongue/throat swelling, SOB or lightheadedness with hypotension: Yes Has patient had a PCN reaction causing severe rash involving mucus membranes or skin necrosis: No Has patient had a PCN reaction that required hospitalization No Has patient had a PCN reaction occurring within the last 10 years: No If all of the above answers are "NO", then may proceed with Cephalosporin use.    Patient Measurements: Height: 5\' 2"  (157.5 cm) Weight: 168 lb 3.4 oz (76.3 kg) IBW/kg (Calculated) : 50.1 Heparin Dosing Weight: 67 kg  Vital Signs: Temp: 98.3 F (36.8 C) (10/24 0356) Temp Source: Oral (10/24 0356) BP: 113/35 (10/24 0630)  Labs:  Recent Labs  12/21/15 1123 12/21/15 1755 12/22/15 0005  12/22/15 0531 12/23/15 0601 12/24/15 0014 12/24/15 0017 12/24/15 0326  HGB  --   --   --   < > 10.9* 11.2*  --   --  11.6*  HCT  --   --   --   --  32.9* 35.7*  --   --  36.3  PLT  --   --   --   --  125* 125*  --   --  137*  APTT  --   --   --   --   --   --  163*  --   --   HEPARINUNFRC  --   --   --   --   --   --   --  >2.20*  --   CREATININE  --   --   --   --  0.95 1.21*  --   --  0.89  TROPONINI <0.03 <0.03 <0.03  --   --   --   --   --   --   < > = values in this interval not displayed.  Estimated Creatinine Clearance: 54.7 mL/min (by C-G formula based on SCr of 0.89 mg/dL).  Medications:  Scheduled:  . acetylcysteine  4 mL Nebulization TID  . atorvastatin  20 mg Oral QPM  . aztreonam  1 g Intravenous Q8H  . ciprofloxacin-dexamethasone  4 drop Both Ears BID  . clindamycin (CLEOCIN) IV  300 mg Intravenous Q8H  . fluticasone  2 spray Each Nare BID  . furosemide  20 mg Oral Daily  .  guaiFENesin  600 mg Oral BID  . levalbuterol  0.63 mg Nebulization TID  . losartan  25 mg Oral Daily  . mouth rinse  15 mL Mouth Rinse BID  . mometasone-formoterol  2 puff Inhalation BID  . pantoprazole  40 mg Oral QAC breakfast  . polyethylene glycol  17 g Oral Daily  . senna-docusate  1 tablet Oral BID  . sodium chloride flush  3 mL Intravenous Q12H   Infusions:  . heparin     Assessment: Gabrielle [name] is a 70 y.o. female admitted on 12/16/2015 with ADHF, possible COPD exacerbation, or pneumonia.  During diuresis, she developed Afib with RVR and was started on Eliquis anticoagulation on 10/20.  She has NOT been on chronic anticoagulation PTA due to history of GIB.  During her swallow eval on 10/23 she vomited and has been made NPO.  Pharmacy is now consulted to dose Heparin IV while  Eliquis PO is held.  Most recent Eliquis dose charted on 10/22 at 2250.  Morning dose on 10/23 was NOT given d/t vomiting. Begin heparin gtt > 12 hrs after last Eliquis dose.  Today, 12/24/2015:  Heparin was held from 0700 -1030 for endoscopy  Previous heparin level >2.2 (falsely elevated from Eliquis) and APTT 163 (supra-therapeutic)  SCr 0.89, CrCl ~ 55 ml/min  CBC: Hgb 11.6, Plt 137 (baseline 70-120's)  No bleeding or complications reported.    Goal of Therapy:  Heparin level 0.3-0.7 units/ml aPTT 66-102 seconds Monitor platelets by anticoagulation protocol: Yes   Plan:   Resume heparin drip s/p procedure at 10am per MD order.  Re-start heparin IV infusion at 750 units/hr (lower dose d/t previously elevated levels)  Heparin level and APTT 8 hours after starting heparin  Continue to check APTT with each HL until effects of Eliquis on heparin level diminish.  Daily heparin level and CBC  Continue to monitor H&H and platelets   Lynann Beaverhristine Cloria Ciresi PharmD, BCPS Pager [phone] 12/24/2015 10:35 AM

## 2015-12-25 ENCOUNTER — Encounter (HOSPITAL_COMMUNITY): Payer: Self-pay | Admitting: Gastroenterology

## 2015-12-25 DIAGNOSIS — K2211 Ulcer of esophagus with bleeding: Secondary | ICD-10-CM

## 2015-12-25 DIAGNOSIS — I509 Heart failure, unspecified: Secondary | ICD-10-CM

## 2015-12-25 DIAGNOSIS — K284 Chronic or unspecified gastrojejunal ulcer with hemorrhage: Secondary | ICD-10-CM

## 2015-12-25 DIAGNOSIS — I2583 Coronary atherosclerosis due to lipid rich plaque: Secondary | ICD-10-CM

## 2015-12-25 DIAGNOSIS — R0602 Shortness of breath: Secondary | ICD-10-CM

## 2015-12-25 DIAGNOSIS — D696 Thrombocytopenia, unspecified: Secondary | ICD-10-CM

## 2015-12-25 DIAGNOSIS — I2510 Atherosclerotic heart disease of native coronary artery without angina pectoris: Secondary | ICD-10-CM

## 2015-12-25 DIAGNOSIS — L8990 Pressure ulcer of unspecified site, unspecified stage: Secondary | ICD-10-CM | POA: Insufficient documentation

## 2015-12-25 DIAGNOSIS — R1310 Dysphagia, unspecified: Secondary | ICD-10-CM

## 2015-12-25 LAB — CBC
HCT: 32.3 % — ABNORMAL LOW (ref 36.0–46.0)
Hemoglobin: 10.6 g/dL — ABNORMAL LOW (ref 12.0–15.0)
MCH: 32 pg (ref 26.0–34.0)
MCHC: 32.8 g/dL (ref 30.0–36.0)
MCV: 97.6 fL (ref 78.0–100.0)
Platelets: 110 10*3/uL — ABNORMAL LOW (ref 150–400)
RBC: 3.31 MIL/uL — ABNORMAL LOW (ref 3.87–5.11)
RDW: 17.8 % — ABNORMAL HIGH (ref 11.5–15.5)
WBC: 9.2 10*3/uL (ref 4.0–10.5)

## 2015-12-25 LAB — BASIC METABOLIC PANEL
Anion gap: 4 — ABNORMAL LOW (ref 5–15)
BUN: 16 mg/dL (ref 6–20)
CO2: 28 mmol/L (ref 22–32)
Calcium: 9.2 mg/dL (ref 8.9–10.3)
Chloride: 106 mmol/L (ref 101–111)
Creatinine, Ser: 0.91 mg/dL (ref 0.44–1.00)
GFR calc Af Amer: >60 mL/min (ref >60)
GFR calc non Af Amer: >60 mL/min (ref >60)
Glucose, Bld: 91 mg/dL (ref 65–99)
Potassium: 4.1 mmol/L (ref 3.5–5.1)
Sodium: 138 mmol/L (ref 135–145)

## 2015-12-25 LAB — URINE CULTURE

## 2015-12-25 LAB — APTT: aPTT: 98 seconds — ABNORMAL HIGH (ref 24–36)

## 2015-12-25 LAB — HEPARIN LEVEL (UNFRACTIONATED): Heparin Unfractionated: 1.54 IU/mL — ABNORMAL HIGH (ref 0.30–0.70)

## 2015-12-25 LAB — MAGNESIUM: Magnesium: 2.1 mg/dL (ref 1.7–2.4)

## 2015-12-25 MED ORDER — ENSURE ENLIVE PO LIQD
237.0000 mL | ORAL | Status: DC
  Administered 2015-12-25 – 2015-12-27 (×2): 237 mL via ORAL

## 2015-12-25 MED ORDER — PANTOPRAZOLE SODIUM 40 MG PO TBEC
40.0000 mg | DELAYED_RELEASE_TABLET | Freq: Every day | ORAL | Status: DC
  Administered 2015-12-26 – 2015-12-28 (×3): 40 mg via ORAL
  Filled 2015-12-25 (×3): qty 1

## 2015-12-25 MED ORDER — APIXABAN 5 MG PO TABS
5.0000 mg | ORAL_TABLET | Freq: Two times a day (BID) | ORAL | Status: DC
  Administered 2015-12-25: 5 mg via ORAL
  Filled 2015-12-25: qty 1

## 2015-12-25 NOTE — Progress Notes (Signed)
ANTICOAGULATION CONSULT NOTE - Follow Up Consult  Pharmacy Consult for Heparin Indication: Afib, Eliquis is held  Allergies  Allergen Reactions  . Novocain [Procaine] Hives and Other (See Comments)    palpitations  . Penicillins Hives    Has patient had a PCN reaction causing immediate rash, facial/tongue/throat swelling, SOB or lightheadedness with hypotension: Yes Has patient had a PCN reaction causing severe rash involving mucus membranes or skin necrosis: No Has patient had a PCN reaction that required hospitalization No Has patient had a PCN reaction occurring within the last 10 years: No If all of the above answers are "NO", then may proceed with Cephalosporin use.    Patient Measurements: Height: 5\' 2"  (157.5 cm) Weight: 168 lb (76.2 kg) IBW/kg (Calculated) : 50.1 Heparin Dosing Weight:   Vital Signs: Temp: 98.4 F (36.9 C) (10/25 0519) Temp Source: Oral (10/25 0519) BP: 138/50 (10/25 0519) Pulse Rate: 74 (10/25 0519)  Labs:  Recent Labs  12/23/15 0601 12/24/15 0014 12/24/15 0017 12/24/15 0326 12/24/15 1900 12/25/15 0530  HGB 11.2*  --   --  11.6*  --  10.6*  HCT 35.7*  --   --  36.3  --  32.3*  PLT 125*  --   --  137*  --  110*  APTT  --  163*  --   --  142* 98*  HEPARINUNFRC  --   --  >2.20*  --  >2.20*  --   CREATININE 1.21*  --   --  0.89  --  0.91    Estimated Creatinine Clearance: 53.4 mL/min (by C-G formula based on SCr of 0.91 mg/dL).   Medications:  Infusions:  . heparin 600 Units/hr (12/25/15 0345)    Assessment: Patient with heparin at goal PTT.  PTT ordered with Heparin level until both correlate due to possible drug-lab interaction between oral anticoagulant (rivaroxaban, edoxaban, or apixaban) and anti-Xa level (aka heparin level)   Heparin level not resulted yet.  No heparin issues noted.  Goal of Therapy:  Heparin level 0.3-0.7 units/ml aPTT 66-102 seconds Monitor platelets by anticoagulation protocol: Yes   Plan:  Continue  heparin drip at current rate Recheck level/PTT at 1400  Darlina GuysGrimsley Jr, Jacquenette ShoneJulian Crowford 12/25/2015,6:35 AM

## 2015-12-25 NOTE — Progress Notes (Signed)
PHARMACIST - PHYSICIAN COMMUNICATION DR:   Laural BenesJohnson CONCERNING: IV to Oral Route Change Policy  RECOMMENDATION: This patient is receiving Protonix by the intravenous route.  Based on criteria approved by the Pharmacy and Therapeutics Committee, these medications are being converted to the equivalent oral dose form(s).  DESCRIPTION: These criteria include:  The patient is eating (either orally or per tube) and/or has been taking other orally administered medications for at least 24 hours.   This patient has no evidence of active gastrointestinal bleeding or impaired GI absorption (gastrectomy, short bowel, patient on TNA or NPO).   If you have questions about this conversion, please contact the Pharmacy Department  []   [phone]( [phone] )  [facility]   Shai Rasmussen PharmD, [place] Pager [phone] 12/25/2015 10:03 AM

## 2015-12-25 NOTE — Progress Notes (Signed)
SLP Cancellation Note  Patient Details Name: Gabrielle [name] MRN: [id number] DOB: 12/17/1945   Cancelled treatment:       Reason Eval/Treat Not Completed: Fatigue/lethargy limiting ability to participate (pt currently asleep and did not awaken adequately for po, no family present, will continue efforts)   Mills KollerKimball, Hadasa Gasner Ann Jurnee Nakayama, MS [facility] SLP [phone]

## 2015-12-25 NOTE — Care Management Important Message (Signed)
Important Message  Patient Details  Name: Gabrielle PalauVirginia W Roulhac MRN: [id number] Date of Birth: 1945-08-29   Medicare Important Message Given:  Yes    Haskell FlirtJamison, Panagiota Perfetti 12/25/2015, 9:58 AMImportant Message  Patient Details  Name: Gabrielle PalauVirginia W Mcclanahan MRN: [id number] Date of Birth: 1945-08-29   Medicare Important Message Given:  Yes    Haskell FlirtJamison, Jaray Boliver 12/25/2015, 9:58 AM

## 2015-12-25 NOTE — Care Management Note (Signed)
Case Management Note  Patient Details  Name: Gabrielle [name] MRN: [id number] Date of Birth: 1945/04/01  Subjective/Objective:    PT recc HHPT. Provided patient w/HHC agency list for choice-chose [facility]-rep Darl PikesSusan aware, & following-await HHTP,[id number] order. Patient will stay w/twin sister:Gabrielle [name] c#[phone] address:[address] [place].Noted on 02-will monitor if needed @ home.                Action/Plan:d/c home w/HHC.   Expected Discharge Date:   (unknown)               Expected Discharge Plan:  Home w Home Health Services  In-House Referral:  Clinical Social Work  Discharge planning Services  CM Consult  Post Acute Care Choice:    Choice offered to:  Patient  DME Arranged:    DME Agency:     HH Arranged:    HH Agency:  [facility]  Status of Service:  In process, will continue to follow  If discussed at Long Length of Stay Meetings, dates discussed:    Additional Comments:  Lanier ClamMahabir, Acey [place]field, RN 12/25/2015, 11:27 AM

## 2015-12-25 NOTE — Progress Notes (Signed)
Received call from primary team. Pt nearing discharge and is ready for pacemaker placement tomorrow. Hold eliquis tonight and in the morning. Will ask Dr Elberta Fortisamnitz to discuss patient with Dr Ladona Ridgelaylor who is in the EP lab tomorrow.    Gypsy BalsamAmber Seiler, NP 12/25/2015 12:22 PM

## 2015-12-25 NOTE — Progress Notes (Signed)
Patient Name: Gabrielle [name] Date of Encounter: 12/25/2015  Primary Cardiologist: [facility] Problem List     Principal Problem:   Acute respiratory failure with hypoxia [facility]) Active Problems:   COPD exacerbation (HCC)   Atrial fibrillation with controlled ventricular response (HCC)   Coronary artery disease due to lipid rich plaque   Acute on chronic diastolic CHF (congestive heart failure) (HCC)   Community acquired pneumonia of right middle lobe of lung (HCC)   Fall   Prolonged Q-T interval on ECG   Chronic anticoagulation   Dysphagia   Tachy-brady syndrome (HCC)   Pressure injury of skin    Subjective   Feels so/so. Stiff back from laying in bed, some palpitations earlier. Was in AF relatively persistently yesterday from 9:15am - 2:15pm, with infrequent paroxysms since that time. Currently on a neb treatment with a vest that vibrates her chest to loosen up secretions (which correlates with odd-looking telemetry).   Inpatient Medications    . atorvastatin  20 mg Oral QPM  . aztreonam  1 g Intravenous Q8H  . ciprofloxacin-dexamethasone  4 drop Both Ears BID  . clindamycin (CLEOCIN) IV  300 mg Intravenous Q8H  . fluticasone  2 spray Each Nare BID  . furosemide  20 mg Oral Daily  . guaiFENesin  600 mg Oral BID  . levalbuterol  0.63 mg Nebulization TID  . losartan  25 mg Oral Daily  . mouth rinse  15 mL Mouth Rinse BID  . mometasone-formoterol  2 puff Inhalation BID  . pantoprazole (PROTONIX) IV  40 mg Intravenous Q24H  . polyethylene glycol  17 g Oral Daily  . senna-docusate  1 tablet Oral BID  . sodium chloride flush  3 mL Intravenous Q12H  . sodium chloride HYPERTONIC  4 mL Nebulization Daily  . sucralfate  1 g Oral TID WC & HS    Vital Signs    Vitals:   12/24/15 1726 12/24/15 1957 12/24/15 2113 12/25/15 0519  BP: (!) 115/53  (!) 102/49 (!) 138/50  Pulse: 74  70 74  Resp: 20  20 20   Temp: 97.7 F (36.5 C)  99.1 F (37.3 C) 98.4 F (36.9 C)    TempSrc: Oral  Oral Oral  SpO2: 95% 99% 99% 99%  Weight: 168 lb (76.2 kg)     Height: 5\' 2"  (1.575 m)       Intake/Output Summary (Last 24 hours) at 12/25/15 0845 Last data filed at 12/25/15 0700  Gross per 24 hour  Intake              363 ml  Output              875 ml  Net             -512 ml   Filed Weights   12/23/15 0435 12/24/15 0545 12/24/15 1726  Weight: 168 lb 10.4 oz (76.5 kg) 168 lb 3.4 oz (76.3 kg) 168 lb (76.2 kg)    Physical Exam    General: Well developed, well nourished WF, in no acute distress - currently on neb. HEENT: Normocephalic, atraumatic, sclera non-icteric, no xanthomas, nares are without discharge. Neck: Negative for carotid bruits. JVP not elevated. Lungs: Diminished BS bilaterally, no wheezes or rhonchi. Breathing is unlabored. Cardiac: RRR S1 S2 without murmurs, rubs, or gallops.  Abdomen: Soft, non-tender, non-distended with normoactive bowel sounds. No rebound/guarding. Extremities: No clubbing or cyanosis. No edema. Distal pedal pulses are 2+ and equal bilaterally. Skin: Warm and  dry, no significant rash. Neuro: Alert and oriented X 3. Strength and sensation in tact. Psych:  Responds to questions appropriately with a normal affect.  Labs    CBC  Recent Labs  12/24/15 0326 12/25/15 0530  WBC 12.7* 9.2  HGB 11.6* 10.6*  HCT 36.3 32.3*  MCV 97.6 97.6  PLT 137* 110*   Basic Metabolic Panel  Recent Labs  12/24/15 0326 12/25/15 0530  NA 138 138  K 4.4 4.1  CL 105 106  CO2 26 28  GLUCOSE 119* 91  BUN 17 16  CREATININE 0.89 0.91  CALCIUM 9.2 9.2  MG 2.3 2.1   Liver Function Tests  Recent Labs  12/23/15 0601 12/24/15 0326  AST 32 32  ALT 11* 12*  ALKPHOS 80 89  BILITOT 2.0* 1.7*  PROT 5.7* 5.9*  ALBUMIN 3.2* 3.4*    Telemetry    Was in AF relatively persistently yesterday from 9:15am - 2:15pm, with infrequent paroxysms since that time, occ PACs  Radiology    Dg Chest 2 View  Result Date: 12/21/2015 CLINICAL  DATA:  New onset productive cough this morning with shortness-of-breath and weakness. Mild hypoxia. EXAM: CHEST  2 VIEW COMPARISON:  12/16/2015, 11/27/2009 and chest CT 12/18/2015 FINDINGS: Median sternotomy wires are unchanged. Lungs are adequately inflated with minimal linear scarring over the left midlung and left base unchanged. Hazy opacification over the right middle lobe without significant change. Mild stable cardiomegaly. Calcified plaque is present over the aortic arch. There are degenerative changes of the spine. IMPRESSION: Hazy opacification over the right middle lobe unchanged and may be due to atelectasis or infection. Mild stable cardiomegaly. Aortic atherosclerosis. Electronically Signed   By: Elberta Fortis M.D.   On: 12/21/2015 16:00   Dg Chest 2 View  Result Date: 12/16/2015 CLINICAL DATA:  Chest pain and shortness of Breath EXAM: CHEST  2 VIEW COMPARISON:  11/27/2009 FINDINGS: Cardiac shadow is enlarged. Postsurgical changes are again seen. The lungs are hyperinflated with some mid lung scarring on the left. Increased density in the right lung base is noted projecting in the right middle lobe on the lateral projection. Mild interstitial edema is noted particularly in the right lung base. No sizable effusion is seen. No acute bony abnormality is noted. IMPRESSION: Right middle lobe infiltrate. Mild interstitial edema is noted in the right lung base. Electronically Signed   By: Alcide Clever M.D.   On: 12/16/2015 17:06   Dg Thoracic Spine 2 View  Result Date: 12/17/2015 CLINICAL DATA:  Back pain.  Fall. EXAM: THORACIC SPINE 2 VIEWS COMPARISON:  12/16/2015. FINDINGS: No acute bony abnormality identified. Degenerative changes thoracic spine. Stable minimal upper thoracic vertebral body compressions. Aortic atherosclerotic vascular calcification. Prior CABG. IMPRESSION: 1. Diffuse degenerative changes thoracic spine. Old mild upper thoracic compressions noted. No acute abnormality. 2. Aortic  atherosclerotic vascular disease.  Prior CABG. Electronically Signed   By: Maisie Fus  Register   On: 12/17/2015 07:14   Dg Lumbar Spine Complete  Result Date: 12/17/2015 CLINICAL DATA:  Shoulder pain.  Fall. EXAM: LUMBAR SPINE - COMPLETE 4+ VIEW COMPARISON:  Ultrasound 07/30/2015.  MRI 08/16/2013.  CT 12/01/2010. FINDINGS: Diffuse multilevel degenerative change. 4 mm anterolisthesis L4 on L5. Mild L3 vertebral body compression. This is new from prior study of 08/16/2013. Aortic atherosclerotic vascular calcification. Aneurysmal dilatation of the distal abdominal aorta 3 cm cannot be excluded. Abdominal aortic ultrasound can be obtained for further evaluation . Left nephrolithiasis again noted. IMPRESSION: 1. Mild L3 vertebral body compression. This  is new from prior study of 08/16/2013. 2. Diffuse degenerative change lumbar spine with 4 mm anterolisthesis L4 on L5. 3.Aortic atherosclerotic vascular disease. Aneurysmal dilatation of the distal abdominal aorta to 3 cm cannot be excluded. Abdominal aortic ultrasound can be obtained for further evaluation. Left nephrolithiasis again noted. 4. Left nephrolithiasis again noted. Electronically Signed   By: Maisie Fus  Register   On: 12/17/2015 07:21   Dg Pelvis 1-2 Views  Result Date: 12/17/2015 CLINICAL DATA:  Back pain.  Fall. EXAM: PELVIS - 1-2 VIEW COMPARISON:  CT 12/01/2010. FINDINGS: Degenerative changes lumbar spine and both hips. No acute bony abnormality. Vascular calcification. IMPRESSION: Degenerative changes lumbar spine and both hips. No acute bony abnormality. Electronically Signed   By: Maisie Fus  Register   On: 12/17/2015 07:19   Ct Chest Wo Contrast  Result Date: 12/22/2015 CLINICAL DATA:  Cough.  Shortness of breath. EXAM: CT CHEST WITHOUT CONTRAST TECHNIQUE: Multidetector CT imaging of the chest was performed following the standard protocol without IV contrast. COMPARISON:  12/21/2015 radiograph and 12/18/2015 chest CT FINDINGS: Cardiovascular:  Coronary, aortic arch, and branch vessel atherosclerotic vascular disease. Moderate cardiomegaly. Prior CABG. Prominent main pulmonary artery. Mediastinum/Nodes: Small mediastinal lymph nodes are not pathologically enlarged by size criteria. Lungs/Pleura: Compared 4 days ago, the is a new 4 mm right upper lobe nodule on image 37 of series 4 which is likely inflammatory given that it was not present previously. There is some scattered atelectasis in the right upper lobe along with some notable bilateral airway thickening. There is new complete atelectasis of the right middle lobe with new filling or occlusion of the right middle lobe bronchus. There is occlusion of segmental bronchi in the right lower lobe with infrahilar atelectasis and bandlike densities. Similar scarring or atelectasis in the left upper lobe compared to the prior exam. Mildly increased atelectasis in the left lower lobe. Upper Abdomen: New mild perihepatic ascites. Musculoskeletal: Unremarkable IMPRESSION: 1. Worsening bilateral airway thickening, with new complete occlusion of the right middle lobe bronchus and complete atelectasis of the right middle lobe, and segmental bronchi inclusion in the right lower lobe with increasing right infrahilar atelectasis. There is also mildly increased left lower lobe atelectasis. 2. New mild perihepatic ascites, cause uncertain. 3. Coronary, aortic arch, and branch vessel atherosclerotic vascular disease. Moderate cardiomegaly. Prominent main pulmonary artery suggesting pulmonary arterial hypertension. Prior CABG. Electronically Signed   By: Gaylyn Rong M.D.   On: 12/22/2015 19:14   Ct Chest Wo Contrast  Result Date: 12/19/2015 CLINICAL DATA:  Acute onset of respiratory failure. Hypoxia. Atrial fibrillation. Initial encounter. EXAM: CT CHEST WITHOUT CONTRAST TECHNIQUE: Multidetector CT imaging of the chest was performed following the standard protocol without IV contrast. COMPARISON:  Chest  radiograph performed 12/16/2015 FINDINGS: Cardiovascular: The heart is mildly enlarged. Diffuse coronary artery calcifications are seen. Calcification is noted at the mitral valve. Scattered calcification is noted along the thoracic aorta. The great vessels are grossly unremarkable. Mediastinum/Nodes: The mediastinum is otherwise unremarkable. No mediastinal lymphadenopathy is seen. No pericardial effusion is identified. The thyroid gland is grossly unremarkable. No axillary lymphadenopathy is seen. The patient is status post median sternotomy. Lungs/Pleura: Nodular interstitial opacities are noted at the lung bases. This may reflect scarring, atypical infection, or mild interstitial lung disease. No pleural effusion or pneumothorax is seen. Upper Abdomen: The visualized portions of the liver and spleen are grossly unremarkable. The visualized portions of the pancreas and adrenal glands are grossly unremarkable. There is mild left renal atrophy. Nonobstructing bilateral renal  stones are seen, measuring up to 3 mm in size. Musculoskeletal: No acute osseous abnormalities are identified. The visualized musculature is unremarkable in appearance. IMPRESSION: 1. Nodular interstitial opacities at the lung bases. This may reflect scarring, atypical infection or mild interstitial lung disease. 2. Mild cardiomegaly. Diffuse coronary artery calcifications seen. Calcification at the mitral valve. 3. Mild left renal atrophy. Nonobstructing bilateral renal stones, measuring up to 3 mm in size. Electronically Signed   By: Roanna RaiderJeffery  Chang M.D.   On: 12/19/2015 00:49   Dg Esophagus  Addendum Date: 12/23/2015   ADDENDUM REPORT: 12/23/2015 13:51 ADDENDUM: These results were called by telephone at the time of interpretation on 12/23/2015 at 11:51 am to Dr. Albertine GratesFANG XU , who verbally acknowledged these results. Electronically Signed   By: Charlett NoseKevin  Dover M.D.   On: 12/23/2015 13:51   Result Date: 12/23/2015 CLINICAL DATA:  Difficulty  swallowing, possible aspiration. EXAM: ESOPHOGRAM/BARIUM SWALLOW TECHNIQUE: Single contrast examination was performed using  thin barium. FLUOROSCOPY TIME:  Fluoroscopy Time:  1 minutes 12 seconds Radiation Exposure Index (if provided by the fluoroscopic device): 8.5 mGy Number of Acquired Spot Images: 1 COMPARISON:  CT 12/22/2015 FINDINGS: There is near complete obstruction of the distal esophagus with irregular filling defects. A small amount of contrast finally passes through the distal esophagus. Findings concerning for food impaction. Full column stasis of contrast above the distal obstruction with very slow emptying of the esophagus. IMPRESSION: Near complete obstruction of the distal esophagus with filling defects and a very slow trickle of contrast through the distal esophagus into the stomach. Findings concerning for fecal impaction. Cannot exclude partially obstructing underlying distal mass. Recommend further evaluation with endoscopy. Electronically Signed: By: Charlett NoseKevin  Dover M.D. On: 12/23/2015 12:20   Dg Chest Port 1 View  Result Date: 12/24/2015 CLINICAL DATA:  Hypoxia. EXAM: PORTABLE CHEST 1 VIEW COMPARISON:  Radiographs of December 21, 2015. FINDINGS: Stable cardiomediastinal silhouette. Status post coronary artery bypass graft. Atherosclerosis of thoracic aorta is noted. No pneumothorax or pleural effusion is noted. No acute pulmonary disease is noted. Bony thorax is unremarkable. IMPRESSION: No acute cardiopulmonary abnormality seen. Electronically Signed   By: Lupita RaiderJames  Green Jr, M.D.   On: 12/24/2015 10:19   Dg Shoulder Left  Result Date: 12/16/2015 CLINICAL DATA:  Left shoulder pain following fall 2 days ago, initial encounter EXAM: LEFT SHOULDER - 2+ VIEW COMPARISON:  None. FINDINGS: There is no evidence of fracture or dislocation. There is no evidence of arthropathy or other focal bone abnormality. Soft tissues are unremarkable. Old healed rib fractures are noted. IMPRESSION: No acute  abnormality noted. Electronically Signed   By: Alcide CleverMark  Lukens M.D.   On: 12/16/2015 17:06   Ct Maxillofacial Wo Contrast  Result Date: 12/19/2015 CLINICAL DATA:  70 year old female with recent fall. EXAM: CT MAXILLOFACIAL WITHOUT CONTRAST TECHNIQUE: Multidetector CT imaging of the maxillofacial structures was performed. Multiplanar CT image reconstructions were also generated. A small metallic BB was placed on the right temple in order to reliably differentiate right from left. COMPARISON:  None. FINDINGS: Osseous: No acute fracture or dislocation. Chronic degenerative changes of the temporomandibular joints. The bones are osteopenic. Orbits: Negative. No traumatic or inflammatory finding. Sinuses: Clear. Soft tissues: Negative. Limited intracranial: No significant or unexpected finding. IMPRESSION: No acute facial bone fractures. Electronically Signed   By: Elgie CollardArash  Radparvar M.D.   On: 12/19/2015 00:58     Patient Profile     29F with CAD s/p CABG 2010, paroxysmal atrial fibrillation (previously off anticoagulation secondary  to GI bleed in 10/2015), CKD stage III, chronic diastolic CHF, HLD, and bradycardia who presented to [facility] ED on 12/16/2015 for increased falls, generalized weakness, & acute respiratory failure with hypoxia. Complicated hospitalization notable for PNA, hypomagnesemia, prolonged QT (sotalol/Celexa discontinued), acute on chronic diastolic CHF, recurrence of AF RVR 10/22 complicated by episodes of bradycardia/hypotension requiring dopamine (with resumption of Eliquis). Alos had complete RML bronchus occlusion with RML collapse from mucus plugging versus aspiration in addition to solid food dysphagia with bleeding esophageal ulcers on EGD 12/24/15.  Assessment & Plan    1. Parosysmal atrial fibrillation with tachy-brady syndrome - Failed sotalol due to QT prolongation. Had severe bradycardia on increased rate control. EP feels she would benefit from pacemaker implantation the day prior to  planned discharge to allow [place] to use AVN blocking agents. Per Dr. Randa Evens note, OK to proceed with changing IV heparin to oral anticoagulants - may want to continue heparin for now while we further trend Hgb and Plt count, and prior to PPM. Dr. Gershon Crane note indicates he didn't think she was a candidate for anticoagulation. Will clarify with Dr. Rennis Golden. If she is placed on [facility], will need to be followed closely in outpatient setting given her h/o GIB.  2. Prolonged QTc - avoid QT prolonging agents. Keep lytes stable.  3. Chronic diastolic CHF - weight stable for several days. Continue maintenance Lasix.  4. RML collapse and mucus plugging vs aspiration - per pulm.  5. Bleeding esophageal ulcers suspected due to reflux by EGD 12/24/15- per GI at this point. Esophageal brushings pending. Now on IV protonix and carafate.  6. CAD s/p CABG - no recent anginal-type chest pain. EKG similar to prior. Trop neg this adm. Not on ASA due to concomitant plan for DOAC. Not on BB due to bradycardia. Continue statin.  Signed, Laurann Montana, PA-C  12/25/2015, 8:45 AM

## 2015-12-25 NOTE — Progress Notes (Signed)
PT refused Chest Vest during 1400 round. Agreed to do Flutter post neb treatment.

## 2015-12-25 NOTE — Progress Notes (Signed)
Initial Nutrition Assessment  DOCUMENTATION CODES:   Obesity unspecified  INTERVENTION:  - Will order Ensure Enlive once/day, this supplement provides 350 kcal and 20 grams of protein. - Diet advancement as medically feasible. - Continue to encourage PO intakes of meals and supplement. - RD will continue to monitor for additional nutrition-related needs.  NUTRITION DIAGNOSIS:   Swallowing difficulty related to acute illness as evidenced by other (see comment) (esophageal ulcers).   GOAL:   Patient will meet greater than or equal to 90% of their needs  MONITOR:   PO intake, Diet advancement, Weight trends, Labs, Skin, I & O's  REASON FOR ASSESSMENT:   LOS (9 days)  ASSESSMENT:   70 y.o.female with history of atrial fibrillation, psoriasis, severe COPD, congestive heart failure. Seen by GI 8/17 for low-grade GIB. She had previous colonoscopies in the past. EGD 8/17 was normal. Colonoscopy showed probable mucosa but no active bleeding and diverticulosis.  Pt seen for LOS. BMI indicates obesity. Pt currently on FLD. Diet changes as follows:  10/16 @ 2145: Heart Healthy 10/23 @ 1235: NPO 10/24 @ 0930: CLD 10/24 @ 1725: NPO 10/24 @ 1740: CLD 10/25 @ 0720: FLD  Per chart review, pt was eating 75-100% of meals while ordered Heart Healthy diet. Pt sleeping soundly at time of visit and did not awake to name call x3. No family/visitors present at this time. Notes indicate pt with poor appetite for several months PTA; will need to ask more about this at follow-up. Pt had barium swallow on 10/23 with which showed possible food impaction with solid material seen in esophagus following a large breakfast that day. EGD done yesterday and showed esophageal ulcers.   Unable to perform physical assessment without consent at this time and will complete at follow-up. Per chart review, pt has lost 32 lbs (16% body weight) in the past 1 month which is significant for time frame. Of note, she had  lost 9 lbs (4.5% body weight) from 11/12/15-date of admission of 12/16/15 which is not significant for time frame. Additional weight loss of 27 lbs has occurred since admission and Lasix order is in place; will continue to monitor weight trends.  Medications reviewed; 20 mg oral Lasix/day, PRN Zofran, 40 mg oral Protonix/day, 17 g Miralax/day, 1 tablet Senokot BID, 1 g Carafate QID. Labs reviewed.  Diet Order:  Diet full liquid Room service appropriate? Yes; Fluid consistency: Thin  Skin:  Wound (see comment) (Stage 2 sacrum)  Last BM:  10/25  Height:   Ht Readings from Last 1 Encounters:  12/24/15 5\' 2"  (1.575 m)    Weight:   Wt Readings from Last 1 Encounters:  12/24/15 168 lb (76.2 kg)    Ideal Body Weight:  50 kg  BMI:  Body mass index is 30.73 kg/m.  Estimated Nutritional Needs:   Kcal:  1295-1525 (17-20 kcal/kg)  Protein:  61-76 grams (0.8-1 gram/kg)  Fluid:  >/= 1.5 L/day  EDUCATION NEEDS:   No education needs identified at this time    Trenton GammonJessica Vearl Aitken, MS, RD, LDN Inpatient Clinical Dietitian Pager # [phone] After hours/weekend pager # [phone]

## 2015-12-25 NOTE — Progress Notes (Signed)
[facility] PROGRESS NOTE    Progress Note  Gabrielle PalauVirginia W [name]  RUE:[id number] DOB: [birth year] DOA: 12/16/2015 PCP: Cain SaupeFULP, CAMMIE, MD     Brief Narrative:   Gabrielle PalauVirginia W [name] is an 70 y.o. female past medical history of CAD status post CABG, paroxysmal atrial fibrillation, chronic systolic heart failure was referred to the ED by her PCP for shortness of breath, the patient felt 4 days prior to admission, she did not loose consciousness since the fall she's been having back pain she said her PCP on the day of admission found her to be hypoxic and send her to the ED, M.D. she was found to have bilateral wheezing and crackles chest x-ray showing possible pneumonia with vascular congestion.  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   Due to increased cough, ct chest was done on 10/22 found to have complete right middle lobe bronchus occulsion, pulmonary consulted, (patient reported chocked on a piece of chicken, but was able to cough it out ) DG esophagus showed food impaction, [facility] consulted.  She had egd done on 10/24 , Ok per GI to restart eliquis 10/25.  Planning for pacemaker placement 10/26.  Hopefully can discharge 10/27.    Assessment/Plan:   Acute respiratory failure with hypoxia [facility](HCC): ( initial presenting symptom with significant lower extremity edema) Patient presented to the hospital and admitted for sob /hypoxia and edema, likely due to acute decompensated diastolic heart failure initially. 2-D echo lvef wnl, does has diastolic dysfunction, cardiac biomarkers have been negative 2. She was treated with IV Lasix,  then switched to oral lasix on 10/20, follow strict is and os, daily weights.  crackles and lower extremity edema has much improved, working to wean off oxygen today  need to arrange home o2 if needed.  Complete right middle lobe bronchus occlusion with right middle lobe collapse from mucus plugging vs aspiration (she reported chocked on a piece of chicken prior to this) She was started on aztreonam and cleocin, antibiotics discontinued 10/25.   Pulmonary consulted - see bronchoscopy reports.    Food impaction at distal esophagus [facility] consulted, EGD on 10/24  paroxysmal Atrial fibrillation with controlled ventricular response (HCC): sinus rhythm in the hospital until 10/22, then developed tachybrady syndrome and transferred to stepdown chads 2 vasc score is more than 2 Not on anticoagulation previously due to GI bleed. Cardiology recommends to restart eliquis.  Restarted 10/25, hold evening dose as she will be receiving pacemaker 10/26.   Cardiology consulted regarding sotalol in the setting of prolonged QT who recommended d/c sotalol Cardiology signed off and reconsulted to due afib/rvr, tachybrady and hypotension on 10/22, on dopamine drip, now weaned off dopamine drip, EP consulted: planning for pacemaker 10/26.    Prolonged QT: QTC on presentation 602ms D/c celexa,  Keep K.4, mag >2, Cardiology consulted regarding sotalol use in the setting of prolonged QT who recommended d/c sotalol Daily ekg to monitor Qtc, improving  HTN;  bp low normal on 10/20, reduce cozaar dose,  bp elevated on 10/22, add prn hydralazine Held  bp meds on 10/23 due to on dopamine drip, she was able to wean off dopamine drip early am on 10/24  Thrombocytopenia: Unclear etiology,  improving  Normocytic anemia: Does has h/o recurrent gi bleed, no active bleed during this hospitalization anemia panel unremarkable  Right ear pain, right mastoid process tenderness Otoscope exam reveal opaque tympanic  member , no fluids level, no perforation CT maxillofacial no acute findings, started on ear drops, flonase, doxycycline (stopped)  Falls/FTT/poor memory: Not sure questionable near syncopal has anything to do with the prolonged QTC of 600. PT recommended SNF, patient declines, family requested assisted living placement, social worker consulted, Pt only agrees to go home with HHPT, will stay with twin sister for support.    Prior smoker, quit smoking several months ago, h/o copd, no significant wheezing, does has rhonchi and crackles, continue nebs, added mucinex  DVT prophylaxis: eliquis restarted 10/25 Family Communication: patient ,  Disposition Plan/Barrier to D/C:  Planning for DC home with HHPT 10/27  Code Status:     Code Status Orders        Start     Ordered   12/16/15 2148  Full code  Continuous     12/16/15 2148    Code Status History    Date Active Date Inactive Code Status Order ID Comments User Context   10/27/2015  4:16 PM 10/30/2015  6:03 PM Full Code [id number]  Meredith Pel, NP ED   10/21/2015  6:50 PM 10/25/2015  8:23 PM Full Code [id number]  Jeralyn Bennett, MD Inpatient        IV Access:    Peripheral IV   Procedures and diagnostic studies:   Dg Chest Port 1 View  Result Date: 12/24/2015 CLINICAL DATA:  Hypoxia. EXAM: PORTABLE CHEST 1 VIEW COMPARISON:  Radiographs of December 21, 2015. FINDINGS: Stable cardiomediastinal silhouette. Status post coronary artery bypass graft. Atherosclerosis of thoracic aorta is noted. No pneumothorax or pleural effusion is noted. No acute pulmonary disease is noted. Bony thorax is unremarkable. IMPRESSION: No acute cardiopulmonary abnormality seen. Electronically Signed   By: Lupita Raider, M.D.   On: 12/24/2015 10:19     Medical Consultants:    cardiology  Anti-Infectives:   IV Rocephin  on 12/17/2015 Doxycycline started on 12/16/2015 to 10/22 Aztreonam and cleocin from 10/22 to 10/25  Subjective:     Gabrielle [name] is without complaints today  Objective:    Vitals:   12/25/15 0519 12/25/15 0911 12/25/15 1118 12/25/15 1120  BP: (!) 138/50     Pulse: 74     Resp: 20     Temp: 98.4 F (36.9 C)     TempSrc: Oral     SpO2: 99% 98% (!) 86% 95%  Weight:      Height:        Intake/Output Summary (Last 24 hours) at 12/25/15 1219 Last data filed at 12/25/15 0700  Gross per 24 hour  Intake              363 ml  Output              725 ml  Net             -362 ml   Filed Weights   12/23/15 0435 12/24/15 0545 12/24/15 1726  Weight: 76.5 kg (168 lb 10.4 oz) 76.3 kg (168 lb 3.4 oz) 76.2 kg (168 lb)    Exam: General exam: frail, but NAD Respiratory system:  No wheezing this am, does has mild crackles at basis Cardiovascular system: S1 & S2 heard, RRR.  Gastrointestinal system: Abdomen is nondistended, soft and nontender.  Central nervous system: Alert and oriented. No focal neurological deficits. Extremities: + lower extremity pitting edema slowing improving Skin: No rashes, lesions or ulcers Psychiatry: Judgement and insight appear normal. Mood & affect appropriate.    Data Reviewed:    Labs: Basic Metabolic Panel:  Recent Labs Lab 12/20/15 0411 12/21/15 0533 12/22/15 0531 12/23/15 0601 12/24/15 0326 12/25/15 0530  NA 140 141 139 139 138 138  K 4.4 4.1 4.4 4.5 4.4 4.1  CL 101 99* 100* 103 105 106  CO2 34* 34* 33* 30 26 28   GLUCOSE 117* 103* 123* 134* 119* 91  BUN 14 17 17  21* 17 16  CREATININE 1.05* 1.06* 0.95 1.21* 0.89 0.91  CALCIUM 8.6* 9.1 9.4 9.0 9.2 9.2  MG 2.2 1.9  --  1.6* 2.3 2.1   GFR Estimated Creatinine Clearance: 53.4 mL/min (by C-G formula based on SCr of 0.91 mg/dL). Liver Function Tests:  Recent Labs Lab 12/19/15 0525 12/21/15 0533 12/22/15 0531 12/23/15 0601 12/24/15 0326  AST 33 34 31 32 32  ALT 9* 10* 10* 11* 12*  ALKPHOS 83 83 83 80 89  BILITOT 2.7* 1.7* 1.7* 2.0* 1.7*  PROT 5.9* 5.4* 5.6* 5.7* 5.9*  ALBUMIN 3.4* 3.0*  3.1* 3.2* 3.4*   No results for input(s): LIPASE, AMYLASE in the last 168 hours. No results for input(s): AMMONIA in the last 168 hours. Coagulation profile No results for input(s): INR, PROTIME in the last 168 hours.  CBC:  Recent Labs Lab 12/19/15 0525 12/21/15 0533 12/22/15 0531 12/23/15 0601 12/24/15 0326 12/25/15 0530  WBC 10.7* 8.2 11.9* 11.7* 12.7* 9.2  NEUTROABS 6.1 5.7  --   --   --   --   HGB 11.5* 10.8* 10.9* 11.2* 11.6* 10.6*  HCT 35.9* 33.3* 32.9* 35.7* 36.3 32.3*  MCV 95.7 97.1 95.9 98.6 97.6 97.6  PLT 130* 118* 125* 125* 137* 110*   Cardiac Enzymes:  Recent Labs Lab 12/21/15 1123 12/21/15 1755 12/22/15 0005  TROPONINI <0.03 <0.03 <0.03   BNP (last 3 results) No results for input(s): PROBNP in the last 8760 hours. CBG:  Recent Labs Lab 12/19/15 1707  GLUCAP 125*   D-Dimer: No results for input(s): DDIMER in the last 72 hours. Hgb A1c: No results for input(s): HGBA1C in the last 72 hours. Lipid Profile: No results for input(s): CHOL, HDL, LDLCALC, TRIG, CHOLHDL, LDLDIRECT in the last 72 hours. Thyroid function studies: No results for input(s): TSH, T4TOTAL, T3FREE, THYROIDAB in the last 72 hours.  Invalid input(s): FREET3 Anemia work up: No results for input(s): VITAMINB12, FOLATE, FERRITIN, TIBC, IRON, RETICCTPCT in the last 72 hours. Sepsis Labs:  Recent Labs Lab 12/22/15 0531 12/23/15 0601 12/24/15 0326 12/25/15 0530  WBC 11.9* 11.7* 12.7* 9.2   Microbiology Recent Results (from the past 240 hour(s))  MRSA PCR Screening     Status: None   Collection Time: 12/16/15  9:18 PM  Result Value Ref Range Status   MRSA by PCR NEGATIVE NEGATIVE Final    Comment:        The GeneXpert MRSA Assay (FDA approved for NASAL specimens only), is one component of a comprehensive MRSA colonization surveillance program. It is not intended to diagnose MRSA infection nor to guide or monitor treatment for MRSA infections.   Culture,  sputum-assessment     Status: None   Collection Time: 12/18/15  7:03 AM  Result Value Ref Range Status   Specimen Description SPUTUM  Final   Special Requests NONE  Final   Sputum  evaluation   Final    THIS SPECIMEN IS ACCEPTABLE. RESPIRATORY CULTURE REPORT TO FOLLOW.   Report Status 12/18/2015 FINAL  Final  Culture, respiratory (NON-Expectorated)     Status: None   Collection Time: 12/18/15  7:03 AM  Result Value Ref Range Status   Specimen Description SPUTUM  Final   Special Requests NONE  Final   Gram Stain   Final    MODERATE WBC PRESENT,BOTH PMN AND MONONUCLEAR FEW SQUAMOUS EPITHELIAL CELLS PRESENT ABUNDANT GRAM POSITIVE COCCI IN CLUSTERS MODERATE GRAM POSITIVE COCCI IN PAIRS FEW YEAST WITH PSEUDOHYPHAE FEW GRAM NEGATIVE COCCOBACILLI RARE GRAM NEGATIVE DIPLOCOCCI    Culture   Final    Consistent with normal respiratory flora. Performed at [facility]    Report Status 12/20/2015 FINAL  Final  Urine culture     Status: Abnormal   Collection Time: 12/23/15 10:59 AM  Result Value Ref Range Status   Specimen Description URINE, CLEAN CATCH  Final   Special Requests NONE  Final   Culture MULTIPLE SPECIES PRESENT, SUGGEST RECOLLECTION (A)  Final   Report Status 12/25/2015 FINAL  Final     Medications:   . apixaban  5 mg Oral BID  . atorvastatin  20 mg Oral QPM  . ciprofloxacin-dexamethasone  4 drop Both Ears BID  . feeding supplement (ENSURE ENLIVE)  237 mL Oral Q24H  . fluticasone  2 spray Each Nare BID  . furosemide  20 mg Oral Daily  . guaiFENesin  600 mg Oral BID  . levalbuterol  0.63 mg Nebulization TID  . losartan  25 mg Oral Daily  . mouth rinse  15 mL Mouth Rinse BID  . mometasone-formoterol  2 puff Inhalation BID  . [START ON 12/26/2015] pantoprazole  40 mg Oral Daily  . polyethylene glycol  17 g Oral Daily  . senna-docusate  1 tablet Oral BID  . sodium chloride flush  3 mL Intravenous Q12H  . sodium chloride HYPERTONIC  4 mL Nebulization Daily  .  sucralfate  1 g Oral TID WC & HS   Continuous Infusions:     LOS: 9 days    Time spent:  Standley Dakins MD  [facility] Pager [phone]  *Please refer to [email or web address], password [email or web address] to get updated schedule on who will round on this patient, as hospitalists switch teams weekly. If 7PM-7AM, please contact night-coverage at [email or web address], password [email or web address] for any overnight needs.  12/25/2015, 12:19 PM

## 2015-12-25 NOTE — Progress Notes (Addendum)
Physical Therapy Treatment Patient Details Name: Gabrielle PalauVirginia W [name] MRN: [id number] DOB: 1945/10/10 Today's Date: 12/25/2015    History of Present Illness Pt is a 70 y.o. female with PMH of CAD status post CABG, paroxysmal atrial fibrillation, chronic systolic heart failure, COPD, hypertension, hyperlipidemia admitted for acute respiratory failure with hypoxia. During current hospitalization, pt taken to the ICU for bradycardia, hypotension and right middle lobe collapse.     PT Comments    Progressing with mobility. O2 sats 93% on RA during ambulation. Pt tolerated distance well.   Follow Up Recommendations  Home health PT;Supervision/Assistance - 24 hour (pt declining placement)     Equipment Recommendations  None recommended by PT    Recommendations for Other Services       Precautions / Restrictions Precautions Precautions: Fall Precaution Comments: log rolling for safety secondary to acute L3 and hx of thoracic vertebral compression fxs Restrictions Weight Bearing Restrictions: No    Mobility  Bed Mobility Overal bed mobility: Needs Assistance Bed Mobility: Supine to Sit;Sit to Supine     Supine to sit: HOB elevated;Min guard Sit to supine: HOB elevated;Min guard   General bed mobility comments: close guard for safety  Transfers Overall transfer level: Needs assistance   Transfers: Sit to/from Stand Sit to Stand: Min guard Stand pivot transfers: Min guard       General transfer comment: close guard for safety.   Ambulation/Gait Ambulation/Gait assistance: Min guard;Min assist Ambulation Distance (Feet): 115 Feet Assistive device:  (pt pushed dynamap) Gait Pattern/deviations: Step-through pattern;Decreased stride length     General Gait Details: Intermittent assist to stabilize initially then pt progressed to Min guard assist.    Stairs            Wheelchair Mobility    Modified Rankin (Stroke Patients Only)       Balance              Standing balance-Leahy Scale: Poor                      Cognition Arousal/Alertness: Awake/alert Behavior During Therapy: WFL for tasks assessed/performed Overall Cognitive Status: Within Functional Limits for tasks assessed                      Exercises      General Comments        Pertinent Vitals/Pain Pain Assessment: No/denies pain    Home Living                      Prior Function            PT Goals (current goals can now be found in the care plan section) Progress towards PT goals: Progressing toward goals    Frequency    Min 3X/week      PT Plan Current plan remains appropriate    Co-evaluation             End of Session Equipment Utilized During Treatment: Gait belt Activity Tolerance: Patient tolerated treatment well Patient left: in bed;with call bell/phone within reach;with bed alarm set     Time: [id number] PT Time Calculation (min) (ACUTE ONLY): 18 min  Charges:  $Gait Training: 8-22 mins                    G Codes:      Rebeca AlertJannie Rielly Brunn, MPT Pager: [phone]

## 2015-12-25 NOTE — Progress Notes (Signed)
[facility] PROGRESS NOTE Subjective patient swallowing better without any pain no gross bleeding.  Objective: Vital signs in last 24 hours: Temp:  [97.7 F (36.5 C)-99.1 F (37.3 C)] 98.4 F (36.9 C) (10/25 0519) Pulse Rate:  [70-110] 74 (10/25 0519) Resp:  [18-27] 20 (10/25 0519) BP: (96-163)/(33-129) 138/50 (10/25 0519) SpO2:  [88 %-100 %] 99 % (10/25 0519) Weight:  [76.2 kg (168 lb)] 76.2 kg (168 lb) (10/24 1726) Last BM Date: 12/19/15  Intake/Output from previous day: 10/24 0701 - 10/25 0700 In: 363 [I.V.:63; IV Piggyback:300] Out: 1025 [Urine:1025] Intake/Output this shift: No intake/output data recorded.  PE: General-- sitting in bed no acute distress  Abdomen-- soft and nontender  Lab Results:  Recent Labs  12/23/15 0601 12/24/15 0326 12/25/15 0530  WBC 11.7* 12.7* 9.2  HGB 11.2* 11.6* 10.6*  HCT 35.7* 36.3 32.3*  PLT 125* 137* 110*   BMET  Recent Labs  12/23/15 0601 12/24/15 0326 12/25/15 0530  NA 139 138 138  K 4.5 4.4 4.1  CL 103 105 106  CO2 30 26 28   CREATININE 1.21* 0.89 0.91   LFT  Recent Labs  12/23/15 0601 12/24/15 0326  PROT 5.7* 5.9*  AST 32 32  ALT 11* 12*  ALKPHOS 80 89  BILITOT 2.0* 1.7*   PT/INR No results for input(s): LABPROT, INR in the last 72 hours. PANCREAS No results for input(s): LIPASE in the last 72 hours.       Studies/Results: Dg Esophagus  Addendum Date: 12/23/2015   ADDENDUM REPORT: 12/23/2015 13:51 ADDENDUM: These results were called by telephone at the time of interpretation on 12/23/2015 at 11:51 am to Dr. Albertine Grates , who verbally acknowledged these results. Electronically Signed   By: Charlett Nose M.D.   On: 12/23/2015 13:51   Result Date: 12/23/2015 CLINICAL DATA:  Difficulty swallowing, possible aspiration. EXAM: ESOPHOGRAM/BARIUM SWALLOW TECHNIQUE: Single contrast examination was performed using  thin barium. FLUOROSCOPY TIME:  Fluoroscopy Time:  1 minutes 12 seconds Radiation  Exposure Index (if provided by the fluoroscopic device): 8.5 mGy Number of Acquired Spot Images: 1 COMPARISON:  CT 12/22/2015 FINDINGS: There is near complete obstruction of the distal esophagus with irregular filling defects. A small amount of contrast finally passes through the distal esophagus. Findings concerning for food impaction. Full column stasis of contrast above the distal obstruction with very slow emptying of the esophagus. IMPRESSION: Near complete obstruction of the distal esophagus with filling defects and a very slow trickle of contrast through the distal esophagus into the stomach. Findings concerning for fecal impaction. Cannot exclude partially obstructing underlying distal mass. Recommend further evaluation with endoscopy. Electronically Signed: By: Charlett Nose M.D. On: 12/23/2015 12:20   Dg Chest Port 1 View  Result Date: 12/24/2015 CLINICAL DATA:  Hypoxia. EXAM: PORTABLE CHEST 1 VIEW COMPARISON:  Radiographs of December 21, 2015. FINDINGS: Stable cardiomediastinal silhouette. Status post coronary artery bypass graft. Atherosclerosis of thoracic aorta is noted. No pneumothorax or pleural effusion is noted. No acute pulmonary disease is noted. Bony thorax is unremarkable. IMPRESSION: No acute cardiopulmonary abnormality seen. Electronically Signed   By: Lupita Raider, M.D.   On: 12/24/2015 10:19    Medications: I have reviewed the patient's current medications.  Assessment/Plan: 1. Esophageal ulceration. This is probably due to reflux. She is now on PPI therapy as well as carafate liquid and is tolerating clear liquids. At this point we will increase her diet to full liquids. I think it would be fine to go ahead  and change her from IV heparin to oral anticoagulants.   Lequisha Cammack JR,Keahi Mccarney L 12/25/2015, 7:19 AM  This note was created using voice recognition software. Minor errors may Have occurred unintentionally.  Pager: [phone] If no answer or after hours call  [phone]

## 2015-12-26 ENCOUNTER — Encounter (HOSPITAL_COMMUNITY)
Admission: EM | Disposition: A | Discharge status: Home Health | Payer: Self-pay | Source: Home / Self Care | Attending: Internal Medicine

## 2015-12-26 DIAGNOSIS — I495 Sick sinus syndrome: Secondary | ICD-10-CM

## 2015-12-26 HISTORY — PX: EP IMPLANTABLE DEVICE: SHX172B

## 2015-12-26 LAB — BASIC METABOLIC PANEL
Anion gap: 4 — ABNORMAL LOW (ref 5–15)
BUN: 16 mg/dL (ref 6–20)
CO2: 30 mmol/L (ref 22–32)
Calcium: 9.4 mg/dL (ref 8.9–10.3)
Chloride: 107 mmol/L (ref 101–111)
Creatinine, Ser: 1.03 mg/dL — ABNORMAL HIGH (ref 0.44–1.00)
GFR calc Af Amer: >60 mL/min (ref >60)
GFR calc non Af Amer: 53 mL/min — ABNORMAL LOW (ref >60)
Glucose, Bld: 109 mg/dL — ABNORMAL HIGH (ref 65–99)
Potassium: 4.3 mmol/L (ref 3.5–5.1)
Sodium: 141 mmol/L (ref 135–145)

## 2015-12-26 LAB — CREATININE, SERUM
Creatinine, Ser: 1.02 mg/dL — ABNORMAL HIGH (ref 0.44–1.00)
GFR calc Af Amer: >60 mL/min (ref >60)
GFR calc non Af Amer: 54 mL/min — ABNORMAL LOW (ref >60)

## 2015-12-26 LAB — CBC
HCT: 33.8 % — ABNORMAL LOW (ref 36.0–46.0)
HCT: 32 % — ABNORMAL LOW (ref 36.0–46.0)
Hemoglobin: 10.6 g/dL — ABNORMAL LOW (ref 12.0–15.0)
Hemoglobin: 10.2 g/dL — ABNORMAL LOW (ref 12.0–15.0)
MCH: 31.2 pg (ref 26.0–34.0)
MCH: 31 pg (ref 26.0–34.0)
MCHC: 31.4 g/dL (ref 30.0–36.0)
MCHC: 31.9 g/dL (ref 30.0–36.0)
MCV: 99.4 fL (ref 78.0–100.0)
MCV: 97.3 fL (ref 78.0–100.0)
Platelets: 121 10*3/uL — ABNORMAL LOW (ref 150–400)
Platelets: 90 10*3/uL — ABNORMAL LOW (ref 150–400)
RBC: 3.4 MIL/uL — ABNORMAL LOW (ref 3.87–5.11)
RBC: 3.29 MIL/uL — ABNORMAL LOW (ref 3.87–5.11)
RDW: 18 % — ABNORMAL HIGH (ref 11.5–15.5)
RDW: 17.6 % — ABNORMAL HIGH (ref 11.5–15.5)
WBC: 8.4 10*3/uL (ref 4.0–10.5)
WBC: 6.2 10*3/uL (ref 4.0–10.5)

## 2015-12-26 LAB — SURGICAL PCR SCREEN
MRSA, PCR: NEGATIVE
Staphylococcus aureus: NEGATIVE

## 2015-12-26 SURGERY — PACEMAKER IMPLANT

## 2015-12-26 MED ORDER — CHLORHEXIDINE GLUCONATE 4 % EX LIQD
60.0000 mL | Freq: Once | CUTANEOUS | Status: AC
  Administered 2015-12-26: 4 via TOPICAL
  Filled 2015-12-26: qty 60

## 2015-12-26 MED ORDER — FENTANYL CITRATE (PF) 100 MCG/2ML IJ SOLN
INTRAMUSCULAR | Status: AC
Start: 2015-12-26 — End: 2015-12-26
  Filled 2015-12-26: qty 2

## 2015-12-26 MED ORDER — GENTAMICIN SULFATE 40 MG/ML IJ SOLN
INTRAMUSCULAR | Status: AC
  Filled 2015-12-26: qty 2

## 2015-12-26 MED ORDER — VANCOMYCIN HCL IN DEXTROSE 1-5 GM/200ML-% IV SOLN
1000.0000 mg | INTRAVENOUS | Status: AC
  Administered 2015-12-26: 1000 mg via INTRAVENOUS

## 2015-12-26 MED ORDER — FENTANYL CITRATE (PF) 100 MCG/2ML IJ SOLN
INTRAMUSCULAR | Status: DC | PRN
Start: 2015-12-26 — End: 2015-12-26
  Administered 2015-12-26: 25 ug via INTRAVENOUS
  Administered 2015-12-26 (×2): 12.5 ug via INTRAVENOUS

## 2015-12-26 MED ORDER — SODIUM CHLORIDE 0.9 % IV SOLN: INTRAVENOUS | Status: DC

## 2015-12-26 MED ORDER — VANCOMYCIN HCL IN DEXTROSE 1-5 GM/200ML-% IV SOLN
1000.0000 mg | Freq: Two times a day (BID) | INTRAVENOUS | Status: AC
  Administered 2015-12-27: 1000 mg via INTRAVENOUS
  Filled 2015-12-26: qty 200

## 2015-12-26 MED ORDER — ONDANSETRON HCL 4 MG/2ML IJ SOLN: 4.0000 mg | Freq: Four times a day (QID) | INTRAMUSCULAR | Status: DC | PRN

## 2015-12-26 MED ORDER — CEFAZOLIN SODIUM-DEXTROSE 2-4 GM/100ML-% IV SOLN
INTRAVENOUS | Status: AC
  Filled 2015-12-26: qty 100

## 2015-12-26 MED ORDER — ACETAMINOPHEN 325 MG PO TABS: 325.0000 mg | ORAL_TABLET | ORAL | Status: DC | PRN

## 2015-12-26 MED ORDER — VANCOMYCIN HCL IN DEXTROSE 1-5 GM/200ML-% IV SOLN
INTRAVENOUS | Status: AC
  Filled 2015-12-26: qty 200

## 2015-12-26 MED ORDER — SODIUM CHLORIDE 0.9 % IR SOLN
80.0000 mg | Status: AC
  Administered 2015-12-26: 80 mg

## 2015-12-26 MED ORDER — CHLORHEXIDINE GLUCONATE 4 % EX LIQD: 60.0000 mL | Freq: Once | CUTANEOUS | Status: DC

## 2015-12-26 MED ORDER — MIDAZOLAM HCL 5 MG/5ML IJ SOLN
INTRAMUSCULAR | Status: DC | PRN
  Administered 2015-12-26 (×4): 1 mg via INTRAVENOUS

## 2015-12-26 MED ORDER — BUPIVACAINE HCL (PF) 0.25 % IJ SOLN
INTRAMUSCULAR | Status: AC
  Filled 2015-12-26: qty 30

## 2015-12-26 MED ORDER — HEPARIN (PORCINE) IN NACL 2-0.9 UNIT/ML-% IJ SOLN
INTRAMUSCULAR | Status: DC | PRN
Start: 2015-12-26 — End: 2015-12-26
  Administered 2015-12-26: 15:00:00

## 2015-12-26 MED ORDER — LEVALBUTEROL HCL 0.63 MG/3ML IN NEBU
0.6300 mg | INHALATION_SOLUTION | Freq: Two times a day (BID) | RESPIRATORY_TRACT | Status: DC
  Administered 2015-12-26 – 2015-12-28 (×4): 0.63 mg via RESPIRATORY_TRACT
  Filled 2015-12-26 (×4): qty 3

## 2015-12-26 MED ORDER — HEPARIN SODIUM (PORCINE) 5000 UNIT/ML IJ SOLN
5000.0000 [IU] | Freq: Three times a day (TID) | INTRAMUSCULAR | Status: DC
  Administered 2015-12-26 – 2015-12-28 (×6): 5000 [IU] via SUBCUTANEOUS
  Filled 2015-12-26 (×8): qty 1

## 2015-12-26 MED ORDER — HEPARIN (PORCINE) IN NACL 2-0.9 UNIT/ML-% IJ SOLN
INTRAMUSCULAR | Status: AC
  Filled 2015-12-26: qty 500

## 2015-12-26 MED ORDER — SODIUM CHLORIDE 0.9 % IV BOLUS (SEPSIS)
250.0000 mL | Freq: Once | INTRAVENOUS | Status: AC
  Administered 2015-12-26: 250 mL via INTRAVENOUS

## 2015-12-26 MED ORDER — MIDAZOLAM HCL 5 MG/5ML IJ SOLN
INTRAMUSCULAR | Status: AC
  Filled 2015-12-26: qty 5

## 2015-12-26 MED ORDER — LIDOCAINE HCL (PF) 1 % IJ SOLN
INTRAMUSCULAR | Status: DC | PRN
  Administered 2015-12-26: 50 mL

## 2015-12-26 SURGICAL SUPPLY — 9 items
CABLE SURGICAL S-101-97-12 (CABLE) ×2 IMPLANT
INGEVITY MRI 7740-45CM (Lead) ×3 IMPLANT
INGEVITY MRI 7741-52CM (Lead) ×3 IMPLANT
LEAD PACING INGEVITY MRI 45CM (Lead) IMPLANT
LEAD PACING INGEVITY MRI 52CM (Lead) IMPLANT
PACEMAKER ACCOLADE DR-EL (Pacemaker) ×2 IMPLANT
PAD DEFIB LIFELINK (PAD) ×2 IMPLANT
SHEATH CLASSIC 7F (SHEATH) ×4 IMPLANT
TRAY PACEMAKER INSERTION (PACKS) ×2 IMPLANT

## 2015-12-26 NOTE — Progress Notes (Signed)
[facility] PROGRESS NOTE    Progress Note  Gabrielle [name]  ZOX:[id number] DOB: 30-Aug-1945 DOA: 12/16/2015 PCP: [name], MD     Brief Narrative:   Gabrielle [name] is an 70 y.o. female past medical history of CAD status post CABG, paroxysmal atrial fibrillation, chronic systolic heart failure was referred to the ED by her PCP for shortness of breath, the patient felt 4 days prior to admission, she did not loose consciousness since the fall she's been having back pain she said her PCP on the day of admission found her to be hypoxic and send her to the ED, M.D. she was found to have bilateral wheezing and crackles chest x-ray showing possible pneumonia with vascular congestion.  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   Due to increased cough, ct chest was done on 10/22 found to have complete right middle lobe bronchus occulsion, pulmonary consulted, (patient reported chocked on a piece of chicken, but was able to cough it out ) DG esophagus showed food impaction, [facility] consulted.  She had egd done on 10/24 , Ok per GI to restart eliquis 10/25.  Planning for pacemaker placement 10/26.  Hopefully can discharge 10/27.    Assessment/Plan:   Acute respiratory failure with hypoxia [facility]): ( initial presenting symptom with significant lower extremity edema) Patient presented to the hospital and admitted for sob /hypoxia and edema, likely due to acute decompensated diastolic heart failure initially. 2-D echo lvef wnl, does has diastolic dysfunction, cardiac biomarkers have been negative 2. She was treated with IV Lasix,  then switched to oral lasix on 10/20, follow strict is and os, daily weights.  crackles and lower extremity edema has much improved, working to wean off oxygen, down to room air most of 10/25  Complete right middle lobe bronchus occlusion with right middle lobe collapse from mucus plugging vs aspiration (she reported chocked on a piece of chicken prior to this) She was started on aztreonam and cleocin, antibiotics discontinued 10/25.   Pulmonary consulted - see bronchoscopy reports.    Food impaction at distal esophagus [facility] consulted, EGD on 10/24  paroxysmal Atrial fibrillation with controlled ventricular response (HCC): sinus rhythm in the hospital until 10/22, then developed tachybrady syndrome and transferred to stepdown chads 2 vasc score is more than 2 Not on anticoagulation previously due to GI bleed. Cardiology recommends to restart eliquis.  Restarted 10/25, hold evening dose as she will be receiving pacemaker 10/26.   Cardiology consulted regarding sotalol in the setting of prolonged QT who recommended d/c sotalol Cardiology signed off and reconsulted to due afib/rvr, tachybrady and hypotension on 10/22, on dopamine drip, now weaned off dopamine drip, EP consulted: planning for pacemaker 10/26.    Prolonged QT: QTC on presentation D/c celexa,  Keep K.4, mag >2, Cardiology consulted regarding sotalol use in the setting of prolonged QT who recommended d/c sotalol Daily ekg to monitor Qtc, improving  HTN;  bp low normal on 10/20, reduce cozaar dose,  bp elevated on 10/22, add prn hydralazine Held  bp meds on 10/23 due to on dopamine drip, she was able to wean off dopamine drip early am on 10/24  Thrombocytopenia: Unclear  etiology,  improving  Normocytic anemia: Does has h/o recurrent gi bleed, no active bleed during this hospitalization anemia panel unremarkable  Right ear pain, right mastoid process tenderness Otoscope exam reveal opaque tympanic member , no  fluids level, no perforation CT maxillofacial no acute findings, started on ear drops, flonase, doxycycline (stopped)  Falls/FTT/poor memory: Not sure questionable near syncopal has anything to do with the prolonged QTC of 600. PT recommended SNF, patient declines, family requested assisted living placement, social worker consulted, Pt only agrees to go home with HHPT, will stay with twin sister for support.    Prior smoker, quit smoking several months ago, h/o copd, no significant wheezing, does has rhonchi and crackles, continue nebs, added mucinex  DVT prophylaxis: eliquis restarted 10/25 Family Communication: patient ,  Disposition Plan/Barrier to D/C:  Planning for DC home with HHPT 10/27  Code Status: FULL    Code Status Orders        Start     Ordered   12/16/15 2148  Full code  Continuous     12/16/15 2148    Code Status History    Date Active Date Inactive Code Status Order ID Comments User Context   10/27/2015  4:16 PM 10/30/2015  6:03 PM Full Code [id number]  Meredith PelPaula M Guenther, NP ED   10/21/2015  6:50 PM 10/25/2015  8:23 PM Full Code [id number]  Jeralyn BennettEzequiel Zamora, MD Inpatient      IV Access:    Peripheral IV   Procedures and diagnostic studies:   No results found.   Medical Consultants:    cardiology  Anti-Infectives:   IV Rocephin  on 12/17/2015 Doxycycline started on 12/16/2015 to 10/22 Aztreonam and cleocin from 10/22 to 10/25  Subjective:    Gabrielle [name] is without complaints today says she is feeling much better, for pacemaker today  Objective:    Vitals:   12/26/15 0520 12/26/15 0609 12/26/15 0728 12/26/15 1017  BP: (!) 84/48 90/60  (!) 107/49  Pulse: (!) 50   66  Resp:      Temp:      TempSrc:      SpO2:  94% 92%   Weight:      Height:        Intake/Output Summary (Last 24 hours) at 12/26/15 1036 Last data filed at 12/25/15 2034  Gross per 24 hour  Intake                0 ml  Output              900 ml  Net              -900 ml   Filed Weights   12/24/15 0545 12/24/15 1726 12/26/15 0519  Weight: 76.3 kg (168 lb 3.4 oz) 76.2 kg (168 lb) 75.9 kg (167 lb 6.4 oz)    Exam: General exam: frail, but NAD Respiratory system:  No wheezing this am, does has mild crackles at basis Cardiovascular system: S1 & S2 heard, RRR.  Gastrointestinal system: Abdomen is nondistended, soft and nontender.  Central nervous system: Alert and oriented. No focal neurological deficits. Extremities: + lower extremity pitting edema slowing improving Skin: No rashes, lesions or ulcers Psychiatry: Judgement and insight appear normal. Mood & affect appropriate.    Data Reviewed:    Labs: Basic Metabolic Panel:  Recent Labs Lab 12/20/15 0411 12/21/15 0533 12/22/15 0531 12/23/15 0601 12/24/15 0326 12/25/15 0530 12/26/15 0524  NA 140 141 139 139 138 138 141  K 4.4 4.1 4.4 4.5 4.4 4.1 4.3  CL 101 99* 100* 103 105 106 107  CO2 34* 34* 33* 30 26 28 30   GLUCOSE 117* 103* 123* 134* 119* 91 109*  BUN 14 17 17  21* 17 16 16   CREATININE 1.05* 1.06* 0.95 1.21* 0.89 0.91 1.03*  CALCIUM 8.6* 9.1 9.4 9.0 9.2 9.2 9.4  MG 2.2 1.9  --  1.6* 2.3 2.1  --    GFR Estimated Creatinine Clearance: 47.1 mL/min (by C-G formula based on SCr of 1.03 mg/dL (H)). Liver Function Tests:  Recent Labs Lab 12/21/15 0533 12/22/15 0531 12/23/15 0601 12/24/15 0326  AST 34 31 32 32  ALT 10* 10* 11* 12*  ALKPHOS 83 83 80 89  BILITOT 1.7* 1.7* 2.0* 1.7*  PROT 5.4* 5.6* 5.7* 5.9*  ALBUMIN 3.0* 3.1* 3.2* 3.4*   No results for input(s): LIPASE, AMYLASE in the last 168 hours. No results for input(s): AMMONIA in the last 168 hours. Coagulation profile No results for input(s): INR, PROTIME in the last 168 hours.  CBC:  Recent Labs Lab 12/21/15 0533 12/22/15 0531 12/23/15 0601 12/24/15 0326 12/25/15 0530 12/26/15 0524  WBC 8.2 11.9* 11.7* 12.7* 9.2 8.4  NEUTROABS 5.7  --   --   --   --   --   HGB 10.8* 10.9* 11.2* 11.6* 10.6* 10.6*  HCT  33.3* 32.9* 35.7* 36.3 32.3* 33.8*  MCV 97.1 95.9 98.6 97.6 97.6 99.4  PLT 118* 125* 125* 137* 110* 121*   Cardiac Enzymes:  Recent Labs Lab 12/21/15 1123 12/21/15 1755 12/22/15 0005  TROPONINI <0.03 <0.03 <0.03   BNP (last 3 results) No results for input(s): PROBNP in the last 8760 hours. CBG:  Recent Labs Lab 12/19/15 1707  GLUCAP 125*   D-Dimer: No results for input(s): DDIMER in the last 72 hours. Hgb A1c: No results for input(s): HGBA1C in the last 72 hours. Lipid Profile: No results for input(s): CHOL, HDL, LDLCALC, TRIG, CHOLHDL, LDLDIRECT in the last 72 hours. Thyroid function studies: No results for input(s): TSH, T4TOTAL, T3FREE, THYROIDAB in the last 72 hours.  Invalid input(s): FREET3 Anemia work up: No results for input(s): VITAMINB12, FOLATE, FERRITIN, TIBC, IRON, RETICCTPCT in the last 72 hours. Sepsis Labs:  Recent Labs Lab 12/23/15 0601 12/24/15 0326 12/25/15 0530 12/26/15 0524  WBC 11.7* 12.7* 9.2 8.4   Microbiology Recent Results (from the past 240 hour(s))  MRSA PCR Screening     Status: None   Collection Time: 12/16/15  9:18 PM  Result Value Ref Range Status   MRSA by PCR NEGATIVE NEGATIVE Final    Comment:        The GeneXpert MRSA Assay (FDA approved for NASAL specimens only), is one component of a comprehensive MRSA colonization surveillance program. It is not intended to diagnose MRSA infection nor to guide or monitor treatment for MRSA infections.   Culture, sputum-assessment     Status: None   Collection Time: 12/18/15  7:03 AM  Result Value Ref Range Status   Specimen Description SPUTUM  Final   Special Requests NONE  Final   Sputum evaluation   Final    THIS SPECIMEN IS ACCEPTABLE. RESPIRATORY CULTURE REPORT TO FOLLOW.   Report Status 12/18/2015 FINAL  Final  Culture, respiratory (NON-Expectorated)     Status: None   Collection Time: 12/18/15  7:03 AM  Result Value Ref Range Status   Specimen Description SPUTUM   Final   Special Requests NONE  Final   Gram Stain  Final    MODERATE WBC PRESENT,BOTH PMN AND MONONUCLEAR FEW SQUAMOUS EPITHELIAL CELLS PRESENT ABUNDANT GRAM POSITIVE COCCI IN CLUSTERS MODERATE GRAM POSITIVE COCCI IN PAIRS FEW YEAST WITH PSEUDOHYPHAE FEW GRAM NEGATIVE COCCOBACILLI RARE GRAM NEGATIVE DIPLOCOCCI    Culture   Final    Consistent with normal respiratory flora. Performed at [facility]    Report Status 12/20/2015 FINAL  Final  Urine culture     Status: Abnormal   Collection Time: 12/23/15 10:59 AM  Result Value Ref Range Status   Specimen Description URINE, CLEAN CATCH  Final   Special Requests NONE  Final   Culture MULTIPLE SPECIES PRESENT, SUGGEST RECOLLECTION (A)  Final   Report Status 12/25/2015 FINAL  Final     Medications:   . atorvastatin  20 mg Oral QPM  . ciprofloxacin-dexamethasone  4 drop Both Ears BID  . feeding supplement (ENSURE ENLIVE)  237 mL Oral Q24H  . fluticasone  2 spray Each Nare BID  . furosemide  20 mg Oral Daily  . guaiFENesin  600 mg Oral BID  . levalbuterol  0.63 mg Nebulization BID  . losartan  25 mg Oral Daily  . mouth rinse  15 mL Mouth Rinse BID  . mometasone-formoterol  2 puff Inhalation BID  . pantoprazole  40 mg Oral Daily  . polyethylene glycol  17 g Oral Daily  . senna-docusate  1 tablet Oral BID  . sodium chloride flush  3 mL Intravenous Q12H  . sucralfate  1 g Oral TID WC & HS   Continuous Infusions:     LOS: 10 days    Time spent: 25 mins  Standley Dakins MD  [facility] Pager [phone]  *Please refer to [email or web address], password [email or web address] to get updated schedule on who will round on this patient, as hospitalists switch teams weekly. If 7PM-7AM, please contact night-coverage at [email or web address], password [email or web address] for any overnight needs.  12/26/2015, 10:36 AM

## 2015-12-26 NOTE — Progress Notes (Signed)
Patient Name: Gabrielle [name] Date of Encounter: 12/26/2015  Primary Cardiologist: Dr. Dewayne Hatch Problem List     Principal Problem:   Acute respiratory failure with hypoxia [facility]) Active Problems:   COPD exacerbation (HCC)   Atrial fibrillation with controlled ventricular response (HCC)   Coronary artery disease due to lipid rich plaque   Acute on chronic diastolic CHF (congestive heart failure) (HCC)   Community acquired pneumonia of right middle lobe of lung (HCC)   Fall   Prolonged Q-T interval on ECG   Chronic anticoagulation   Dysphagia   Tachy-brady syndrome (HCC)   Pressure injury of skin   Esophageal ulcer with bleeding   Thrombocytopenia (HCC)   Acute congestive heart failure (HCC)   SOB (shortness of breath)    Subjective   In AF yesterday evening and overnight, currently in sinus bradycardia, HR in the 50's. Had palpitations overnight which awoke her from sleep.   No palpitations or chest discomfort currently. Breathing at baseline. Has been NPO since midnight for PPM placement.   Inpatient Medications    Scheduled Meds: . atorvastatin  20 mg Oral QPM  . ciprofloxacin-dexamethasone  4 drop Both Ears BID  . feeding supplement (ENSURE ENLIVE)  237 mL Oral Q24H  . fluticasone  2 spray Each Nare BID  . furosemide  20 mg Oral Daily  . guaiFENesin  600 mg Oral BID  . levalbuterol  0.63 mg Nebulization BID  . losartan  25 mg Oral Daily  . mouth rinse  15 mL Mouth Rinse BID  . mometasone-formoterol  2 puff Inhalation BID  . pantoprazole  40 mg Oral Daily  . polyethylene glycol  17 g Oral Daily  . senna-docusate  1 tablet Oral BID  . sodium chloride flush  3 mL Intravenous Q12H  . sucralfate  1 g Oral TID WC & HS   Continuous Infusions:   PRN Meds: acetaminophen **OR** acetaminophen, ALPRAZolam, hydrALAZINE, levalbuterol, metoprolol, morphine injection, ondansetron **OR** ondansetron (ZOFRAN) IV, polyvinyl alcohol, tiZANidine, traMADol   Vital  Signs    Vitals:   12/26/15 0520 12/26/15 0609 12/26/15 0728 12/26/15 1017  BP: (!) 84/48 90/60  (!) 107/49  Pulse: (!) 50   66  Resp:      Temp:      TempSrc:      SpO2:  94% 92%   Weight:      Height:        Intake/Output Summary (Last 24 hours) at 12/26/15 1023 Last data filed at 12/25/15 2034  Gross per 24 hour  Intake                0 ml  Output              900 ml  Net             -900 ml   Filed Weights   12/24/15 0545 12/24/15 1726 12/26/15 0519  Weight: 168 lb 3.4 oz (76.3 kg) 168 lb (76.2 kg) 167 lb 6.4 oz (75.9 kg)    Physical Exam   GEN: Well nourished, well developed female appearing in no acute distress.  HEENT: Grossly normal.  Neck: Supple, no JVD, carotid bruits, or masses. Cardiac: RRR, no murmurs, rubs, or gallops. No clubbing, cyanosis, edema.  Radials/DP/PT 2+ and equal bilaterally.  Respiratory:  Respirations regular and unlabored, clear to auscultation bilaterally. GI: Soft, nontender, nondistended, BS + x 4. MS: no deformity or atrophy. Skin: warm and dry, no rash. Neuro:  Strength and sensation are intact. Psych: AAOx3.  Normal affect.  Labs    CBC  Recent Labs  12/25/15 0530 12/26/15 0524  WBC 9.2 8.4  HGB 10.6* 10.6*  HCT 32.3* 33.8*  MCV 97.6 99.4  PLT 110* 121*   Basic Metabolic Panel  Recent Labs  12/24/15 0326 12/25/15 0530 12/26/15 0524  NA 138 138 141  K 4.4 4.1 4.3  CL 105 106 107  CO2 26 28 30   GLUCOSE 119* 91 109*  BUN 17 16 16   CREATININE 0.89 0.91 1.03*  CALCIUM 9.2 9.2 9.4  MG 2.3 2.1  --    Liver Function Tests  Recent Labs  12/24/15 0326  AST 32  ALT 12*  ALKPHOS 89  BILITOT 1.7*  PROT 5.9*  ALBUMIN 3.4*   No results for input(s): LIPASE, AMYLASE in the last 72 hours. Cardiac Enzymes No results for input(s): CKTOTAL, CKMB, CKMBINDEX, TROPONINI in the last 72 hours. BNP Invalid input(s): POCBNP D-Dimer No results for input(s): DDIMER in the last 72 hours. Hemoglobin A1C No results for  input(s): HGBA1C in the last 72 hours. Fasting Lipid Panel No results for input(s): CHOL, HDL, LDLCALC, TRIG, CHOLHDL, LDLDIRECT in the last 72 hours. Thyroid Function Tests No results for input(s): TSH, T4TOTAL, T3FREE, THYROIDAB in the last 72 hours.  Invalid input(s): FREET3  Telemetry    Atrial fibrillation yesterday evening and overnight with HR into the 150's. Converted to Sinus Brady this AM, with HR currently in the 50's. - Personally Reviewed  ECG    NSR, HR 72 with no acute ST or T-wave changes.  - Personally Reviewed  Radiology    No results found.  Cardiac Studies   Echocardiogram: 12/17/2015 Study Conclusions  - Left ventricle: The cavity size was moderately dilated. Systolic   function was normal. The estimated ejection fraction was in the   range of 55% to 60%. Wall motion was normal; there were no   regional wall motion abnormalities. There was a reduced   contribution of atrial contraction to ventricular filling, due to   increased ventricular diastolic pressure or atrial contractile   dysfunction. Doppler parameters are consistent with a reversible   restrictive pattern, indicative of decreased left ventricular   diastolic compliance and/or increased left atrial pressure (grade   3 diastolic dysfunction). Doppler parameters are consistent with   high ventricular filling pressure. - Mitral valve: Calcified annulus. The findings are consistent with   mild stenosis. There was mild to moderate regurgitation directed   eccentrically and posteriorly. Mean gradient (D): 3 mm Hg. Valve   area by pressure half-time: 1.42 cm^2. Valve area by continuity   equation (using LVOT flow): 1.6 cm^2. - Left atrium: The atrium was severely dilated. - Pulmonic valve: There was trivial regurgitation. - Pulmonary arteries: PA peak pressure: 57 mm Hg (S).  Impressions:  - The right ventricular systolic pressure was increased consistent   with moderate pulmonary  hypertension.  Patient Profile     [name] w/ PMH of CAD (s/p CABG 2010), paroxysmal atrial fibrillation (previously off anticoagulation secondary to GI bleed in 10/2015), CKD stage III, chronic diastolic CHF, HLD, and bradycardia who [facility] for increased falls,generalized weakness, &acute respiratory failure with hypoxia. Complicated hospitalization notable for PNA, hypomagnesemia, prolonged QT (sotalol/Celexa discontinued), acute on chronic diastolic CHF, recurrence of AF RVR 10/22 complicated by episodes of bradycardia/hypotension requiring dopamine (with resumption of Eliquis). Also had complete RML bronchus occlusion with RML collapse from mucus plugging  versus aspiration in addition to solid food dysphagia with bleeding esophageal ulcers on EGD 12/24/15.  Assessment & Plan    1. Parosysmal atrial fibrillation with tachy-brady syndrome  - Failed Sotalol due to QT prolongation.Had severe bradycardia on increased rate control. EP feels she would benefit from pacemaker implantation the day prior to planned discharge to allow [place] to use AVN blocking agents.  - This patients CHA2DS2-VASc Score and unadjusted Ischemic Stroke Rate (% per year) is equal to 7.2 % stroke rate/year from a score of 5 (CHF, HTN, Vascular, Age, Female). Not on anticoagulation due to history of recent GI bleeds. - for pacemaker placement later today with Dr. Ladona Ridgel. Has been NPO since midnight   2. Prolonged QTc  - avoid QT prolonging agents. Mg stable at 2.1 on 10/25. K+ 4.3 this AM.   3. Chronic diastolic CHF  - Echocardiogram shows preserved EF of 55-60% with grade 3 diastolic dysfunction. BNP elevated to 751 on admission. - weight remains stable at 167 lbs. Continue maintenance Lasix dosing.   4. RML collapse and mucus plugging vs Aspiration  - per Pulmonology.  5. Bleeding esophageal ulcers suspected due to reflux by EGD 12/24/15 - per GI. Esophageal brushings pending. Now on IV protonix  and carafate.  6. CAD s/p CABG  - no recent anginal-type chest pain. EKG similar to prior. Troponin values have been negative this admission. Not on ASA due to concomitant plan for DOAC. Not on BB due to bradycardia. Continue statin therapy.  Signed, Ellsworth Lennox, PA  12/26/2015, 10:23 AM

## 2015-12-26 NOTE — Progress Notes (Signed)
[facility]  alert and able to ambulate to and from bathroom without any problems at this time.  IVF started at 10cc/ hr in left arm.  VS remain stable and pt denies any discomfort at this time. Pt on monitor.. Family at bedside.

## 2015-12-26 NOTE — Progress Notes (Signed)
SLP Cancellation Note  Patient Details Name: Gabrielle [name] MRN: [id number] DOB: 20-Mar-1945   Cancelled treatment:       Reason Eval/Treat Not Completed: Other (comment) (pt npo for pacemaker placement per chart review)   Donavan Burnetamara Janson Lamar, MS [facility] [phone]

## 2015-12-26 NOTE — H&P (Signed)
Primary Care Physician: [name], MD Referring Physician:  Admit Date: 12/16/2015  Reason for consultation: tachy-brady syndrome  Gabrielle [name] is a 70 y.o. female with a h/o CAD (s/p CABG in 2010), paroxysmal atrial fibrillation (not on anticoagulation secondary to GI bleed in 10/2015), Stage 3 CKD, HLD, and bradycardia who presented to [facility] ED on 12/16/2015 for increased falls and generalized weakness.   On admission to the hospital, she was found to have a prolonged QTc of 602 ms. Her sotalol was thus stopped. She was also in acute on chronic diastolic heart failure requiring IV Lasix. She is currently -10 L. She went into atrial fibrillation with RVR and was put on diltiazem. She became bradycardic and she became bradycardic, and diltiazem was discontinued. She was started on low-dose dopamine and required pushes of atropine. Today, she remains in atrial fibrillation with rates in the 120s to 130s.  During this hospitalization, she was found to have a right middle lobe collapse on chest x-ray as well as CT. It was thought that this was likely due to mucus congestion from aspiration. It was thought that she would best be treated with chest physiotherapy and pulmonary toilet. She had an endoscopy which showed evidence of esophageal ulcers.       Past Medical History:  Diagnosis Date  . Anemia   . Arthritis   . Asthma   . Atrial fibrillation with controlled ventricular response (HCC) 11/2009   a. not on anticoagulation due to recurrent GI bleeding while on Eliquis.  . Chronic bronchitis   . Chronic diastolic CHF (congestive heart failure) (HCC)    a. Echo 12/2015: EF 55-60% w/ Grade 3 DD  . Chronic headache   . COPD (chronic obstructive pulmonary disease) (HCC)   . Depression   . Heart attack   . Hyperlipidemia   . Hypertension         Past Surgical History:  Procedure Laterality Date  . CAD-CABG     x7, brief post-op Atrial Fib  . CESAREAN  SECTION     x2  . COLONOSCOPY WITH PROPOFOL N/A 10/24/2015   Procedure: COLONOSCOPY WITH PROPOFOL;  Surgeon: Carman Ching, MD;  Location: [facility] ENDOSCOPY;  Service: Endoscopy;  Laterality: N/A;  . CORONARY ARTERY BYPASS GRAFT  2011  . ESOPHAGOGASTRODUODENOSCOPY N/A 10/22/2015   Procedure: ESOPHAGOGASTRODUODENOSCOPY (EGD);  Surgeon: Carman Ching, MD;  Location: [facility] ENDOSCOPY;  Service: Endoscopy;  Laterality: N/A;  . JOINT REPLACEMENT Bilateral   . TONSILLECTOMY    . TOTAL KNEE ARTHROPLASTY      . atorvastatin  20 mg Oral QPM  . aztreonam  1 g Intravenous Q8H  . ciprofloxacin-dexamethasone  4 drop Both Ears BID  . clindamycin (CLEOCIN) IV  300 mg Intravenous Q8H  . fluticasone  2 spray Each Nare BID  . furosemide  20 mg Oral Daily  . guaiFENesin  600 mg Oral BID  . levalbuterol  0.63 mg Nebulization TID  . losartan  25 mg Oral Daily  . mouth rinse  15 mL Mouth Rinse BID  . mometasone-formoterol  2 puff Inhalation BID  . pantoprazole (PROTONIX) IV  40 mg Intravenous Q24H  . polyethylene glycol  17 g Oral Daily  . senna-docusate  1 tablet Oral BID  . sodium chloride flush  3 mL Intravenous Q12H  . sodium chloride HYPERTONIC  4 mL Nebulization Daily  . sucralfate  1 g Oral TID WC & HS   . heparin 750 Units/hr (12/24/15 1051)  Allergies  Allergen Reactions  . Novocain [Procaine] Hives and Other (See Comments)    palpitations  . Penicillins Hives    Has patient had a PCN reaction causing immediate rash, facial/tongue/throat swelling, SOB or lightheadedness with hypotension: Yes Has patient had a PCN reaction causing severe rash involving mucus membranes or skin necrosis: No Has patient had a PCN reaction that required hospitalization No Has patient had a PCN reaction occurring within the last 10 years: No If all of the above answers are "NO", then may proceed with Cephalosporin use.    Social History        Social History  . Marital status:  Divorced    Spouse name: N/A  . Number of children: N/A  . Years of education: N/A       Occupational History  . retired Clinical biochemist        Social History Main Topics  . Smoking status: Former Smoker    Packs/day: 0.20    Types: Cigarettes  . Smokeless tobacco: Never Used  . Alcohol use No  . Drug use: No  . Sexual activity: Not on file       Other Topics Concern  . Not on file      Social History Narrative  . No narrative on file          Family History  Problem Relation Age of Onset  . Diabetes Mother   . Hypertension Mother   . Heart disease Mother     before age 53  . Heart disease Father     before age 64  . Hypertension Father   . Heart attack Father   . Lung cancer Maternal Grandfather   . Lung cancer Paternal Grandfather   . Hypertension Daughter   . Stroke Paternal Grandmother     ROS- All systems are reviewed and negative except as per the HPI above  Physical Exam: Telemetry:       Vitals:   12/24/15 0910 12/24/15 0915 12/24/15 1000 12/24/15 1200  BP: (!) 160/94 (!) 163/62 (!) 108/57 96/63  Pulse:      Resp: (!) 27 18 19 19   Temp:    97.8 F (36.6 C)  TempSrc:    Oral  SpO2: (!) 88% (!) 89% 92% 100%  Weight:      Height:        GEN- The patient is well appearing, alert and oriented x 3 today.   Head- normocephalic, atraumatic Eyes-  Sclera clear, conjunctiva pink Ears- hearing intact Oropharynx- clear Neck- supple, no JVP Lymph- no cervical lymphadenopathy Lungs- Clear to ausculation bilaterally, normal work of breathing Heart- Tachycardic, irregular, no murmurs, rubs or gallops, PMI not laterally displaced GI- soft, NT, ND, + BS Extremities- no clubbing, cyanosis, or edema MS- no significant deformity or atrophy Skin- no rash or lesion Psych- euthymic mood, full affect Neuro- strength and sensation are intact  EKG: atrial fibrillation on telemetry, EKG performed  yesterday shows sinus rhythm with rates in the 40s  Labs:   Recent Labs       Lab Results  Component Value Date   WBC 12.7 (H) 12/24/2015   HGB 11.6 (L) 12/24/2015   HCT 36.3 12/24/2015   MCV 97.6 12/24/2015   PLT 137 (L) 12/24/2015      Recent Labs Lab 12/24/15 0326  NA 138  K 4.4  CL 105  CO2 26  BUN 17  CREATININE 0.89  CALCIUM 9.2  PROT 5.9*  BILITOT 1.7*  ALKPHOS 89  ALT 12*  AST 32  GLUCOSE 119*   Recent Labs       Lab Results  Component Value Date   CKTOTAL 30 11/28/2009   CKMB 1.2 11/28/2009   TROPONINI <0.03 12/22/2015      Recent Labs        Lab Results  Component Value Date   CHOL  11/28/2009    112        ATP III CLASSIFICATION:  <200     mg/dL   Desirable  [id number]  mg/dL   Borderline High  >=045>=240    mg/dL   High            Recent Labs       Lab Results  Component Value Date   HDL 52 11/28/2009     Recent Labs        Lab Results  Component Value Date   [facility]  [facility]    40        Total Cholesterol/HDL:CHD Risk Coronary Heart Disease Risk Table                     Men   Women  1/2 Average Risk   3.4   3.3  Average Risk       5.0   4.4  2 X Average Risk   9.6   7.1  3 X Average Risk  23.4   11.0        Use the calculated Patient Ratio above and the CHD Risk Table to determine the patient's CHD Risk.        ATP III CLASSIFICATION (LDL):  <100     mg/dL   Optimal  [id number]  mg/dL   Near or Above                    Optimal  130-159  mg/dL   Borderline  [id number]  mg/dL   High  >956>190     mg/dL   Very High     Recent Labs       Lab Results  Component Value Date   TRIG 99 11/28/2009     Recent Labs       Lab Results  Component Value Date   CHOLHDL 2.2 11/28/2009     Recent Labs  No results found for: LDLDIRECT      Radiology: No acute cardiopulmonary abnormality seen.  Echo:  - Left ventricle: The cavity size was moderately dilated. Systolic function was normal. The  estimated ejection fraction was in the range of 55% to 60%. Wall motion was normal; there were no regional wall motion abnormalities. There was a reduced contribution of atrial contraction to ventricular filling, due to increased ventricular diastolic pressure or atrial contractile dysfunction. Doppler parameters are consistent with a reversible restrictive pattern, indicative of decreased left ventricular diastolic compliance and/or increased left atrial pressure (grade 3 diastolic dysfunction). Doppler parameters are consistent with high ventricular filling pressure. - Mitral valve: Calcified annulus. The findings are consistent with mild stenosis. There was mild to moderate regurgitation directed eccentrically and posteriorly. Mean gradient (D): 3 mm Hg. Valve area by pressure half-time: 1.42 cm^2. Valve area by continuity equation (using LVOT flow): 1.6 cm^2. - Left atrium: The atrium was severely dilated. - Pulmonic valve: There was trivial regurgitation. - Pulmonary arteries: PA peak pressure: 57 mm Hg (S).  ASSESSMENT AND PLAN:   1. Tachy-Brady syndrome: Has had both tachycardia and bradycardia while on rate  controlling medications. Due to her need for either beta blockers or calcium channel blockers to control her rates when she has atrial fibrillation, would plan for pacemaker placement. Risks and benefits of the procedure were discussed with the patient. Risks include, but are not limited to bleeding, infection, tamponade, pneumothorax. She understands these risks and has agreed to the procedure. As for the timing of the procedure, would prefer that she is stable from her multiple medical issues. Would plan to place the pacemaker the day prior to planned discharge. Please call EP when discharge has been planned.   2. Persistent atrial fibrillation: Did not tolerate sotalol with a prolonged QTc. This is possibly due to her metabolic derangements, but it is  unclear if this is the case. Her QTC has improved, but remains prolonged on her EKG performed yesterday. Would avoid any QT prolonging medications. At this time, due to her esophageal ulcerations, she is not an anticoagulation candidate, but would be in the future.  This patients CHA2DS2-VASc Score and unadjusted Ischemic Stroke Rate (% per year) is equal to 4.8 % stroke rate/year from a score of 4  Above score calculated as 1 point each if present [CHF, HTN, DM, Vascular=MI/PAD/Aortic Plaque, Age if 65-74, or Female] Above score calculated as 2 points each if present [Age > 75, or Stroke/TIA/TE]   Will Jorja Loa, MD 12/24/2015  1:40 PM  EP Attending  Patient seen and examined. Agree with the findings as documented above. The patient has symptomatic tachy brady syndrome and symptomatic bradycardia. I have discussed the treatment options with the patient and she wishes to proceed with PPM insertion.  Leonia Reeves.D.

## 2015-12-26 NOTE — Progress Notes (Signed)
Pt to procedure room for PPM via stretcher.

## 2015-12-26 NOTE — Progress Notes (Signed)
Patient arrived on the unit from EPS lab, assesment completed see flowsheet, patient oriented to room and staff, placed on tele ccmd nootified, bed in lowest position, call light within reach will continue to monitor

## 2015-12-27 ENCOUNTER — Encounter (HOSPITAL_COMMUNITY): Payer: Self-pay | Admitting: Internal Medicine

## 2015-12-27 ENCOUNTER — Inpatient Hospital Stay (HOSPITAL_COMMUNITY): Payer: Medicare Other

## 2015-12-27 DIAGNOSIS — I495 Sick sinus syndrome: Secondary | ICD-10-CM

## 2015-12-27 LAB — CBC
HCT: 30.8 % — ABNORMAL LOW (ref 36.0–46.0)
Hemoglobin: 9.8 g/dL — ABNORMAL LOW (ref 12.0–15.0)
MCH: 31 pg (ref 26.0–34.0)
MCHC: 31.8 g/dL (ref 30.0–36.0)
MCV: 97.5 fL (ref 78.0–100.0)
Platelets: 88 10*3/uL — ABNORMAL LOW (ref 150–400)
RBC: 3.16 MIL/uL — ABNORMAL LOW (ref 3.87–5.11)
RDW: 17.7 % — ABNORMAL HIGH (ref 11.5–15.5)
WBC: 6.4 10*3/uL (ref 4.0–10.5)

## 2015-12-27 LAB — BASIC METABOLIC PANEL
Anion gap: 4 — ABNORMAL LOW (ref 5–15)
BUN: 16 mg/dL (ref 6–20)
CO2: 27 mmol/L (ref 22–32)
Calcium: 9.2 mg/dL (ref 8.9–10.3)
Chloride: 107 mmol/L (ref 101–111)
Creatinine, Ser: 0.99 mg/dL (ref 0.44–1.00)
GFR calc Af Amer: >60 mL/min (ref >60)
GFR calc non Af Amer: 56 mL/min — ABNORMAL LOW (ref >60)
Glucose, Bld: 166 mg/dL — ABNORMAL HIGH (ref 65–99)
Potassium: 4.3 mmol/L (ref 3.5–5.1)
Sodium: 138 mmol/L (ref 135–145)

## 2015-12-27 MED ORDER — METOPROLOL SUCCINATE ER 25 MG PO TB24
25.0000 mg | ORAL_TABLET | Freq: Every day | ORAL | Status: DC
  Administered 2015-12-27 – 2015-12-28 (×2): 25 mg via ORAL
  Filled 2015-12-27 (×2): qty 1

## 2015-12-27 MED FILL — Sodium Chloride Irrigation Soln 0.9%: Qty: 500 | Status: AC

## 2015-12-27 MED FILL — Gentamicin Sulfate Inj 40 MG/ML: INTRAMUSCULAR | Qty: 2 | Status: AC

## 2015-12-27 NOTE — Progress Notes (Signed)
[facility] PROGRESS NOTE Subjective Pt had PM and is going home no problem swallowing  Objective: Vital signs in last 24 hours: Temp:  [97.7 F (36.5 C)-98.7 F (37.1 C)] 98.6 F (37 C) (10/27 0439) Pulse Rate:  [0-157] 59 (10/27 0439) Resp:  [0-25] 16 (10/27 0439) BP: (83-131)/(32-65) 129/56 (10/27 0439) SpO2:  [0 %-100 %] 97 % (10/27 0439) Weight:  [77.7 kg (171 lb 3.2 oz)] 77.7 kg (171 lb 3.2 oz) (10/27 0439) Last BM Date: 12/25/15  Intake/Output from previous day: 10/26 0701 - 10/27 0700 In: 240 [P.O.:240] Out: 301 [Urine:301] Intake/Output this shift: No intake/output data recorded.    Lab Results:  Recent Labs  12/25/15 0530 12/26/15 0524 12/26/15 1643 12/27/15 0152  WBC 9.2 8.4 6.2 6.4  HGB 10.6* 10.6* 10.2* 9.8*  HCT 32.3* 33.8* 32.0* 30.8*  PLT 110* 121* 90* 88*   BMET  Recent Labs  12/25/15 0530 12/26/15 0524 12/26/15 1643 12/27/15 0152  NA 138 141  --  138  K 4.1 4.3  --  4.3  CL 106 107  --  107  CO2 28 30  --  27  CREATININE 0.91 1.03* 1.02* 0.99   LFT No results for input(s): PROT, AST, ALT, ALKPHOS, BILITOT, BILIDIR, IBILI in the last 72 hours. PT/INR No results for input(s): LABPROT, INR in the last 72 hours. PANCREAS No results for input(s): LIPASE in the last 72 hours.       Studies/Results: Dg Chest 2 View  Result Date: 12/27/2015 CLINICAL DATA:  Pacemaker EXAM: CHEST  2 VIEW COMPARISON:  12/24/2015 FINDINGS: Left pacer in place with leads in the right atrium and right ventricle. No pneumothorax. Prior CABG. Cardiomegaly. Minimal bibasilar atelectasis. No acute bony abnormality. IMPRESSION: Left pacer placement without pneumothorax. Cardiomegaly, bibasilar atelectasis. Electronically Signed   By: Charlett NoseKevin  Dover M.D.   On: 12/27/2015 08:11    Medications: I have reviewed the patient's current medications.  Assessment/Plan: 1. Dysphagia. Patient with severe esophagitis on EGD. Would discharge on carafate  suspensionand PPI. I've instructed her to remain on full liquids for a day or 2 and slowly add mashed potatoes pancakes and other soft foods. Have suggested that she come back and see me in the office in about a month.   Tisha Cline JR,Sage Hammill L 12/27/2015, 8:38 AM  This note was created using voice recognition software. Minor errors may Have occurred unintentionally.  Pager: [phone] If no answer or after hours call [phone]

## 2015-12-27 NOTE — Progress Notes (Signed)
Orthopedic Tech Progress Note Patient Details:  Gabrielle PalauVirginia W [name] Aug 21, 1945 [id number]  Ortho Devices Type of Ortho Device: Arm sling Ortho Device/Splint Interventions: Application   Saul FordyceJennifer C Edda Orea 12/27/2015, 9:33 AM

## 2015-12-27 NOTE — Care Management Important Message (Signed)
Important Message  Patient Details  Name: Gabrielle [name] MRN: [id number] Date of Birth: May 18, 1945   Medicare Important Message Given:  Yes    Kyla BalzarineShealy, Jadee Golebiewski Abena 12/27/2015, 2:42 PM

## 2015-12-27 NOTE — Progress Notes (Signed)
[facility] PROGRESS NOTE    Progress Note  Gabrielle [name]  JWJ:[id number] DOB: [birth year] DOA: 12/16/2015 PCP: [name], MD     Brief Narrative:   Gabrielle [name] is an 70 y.o. female past medical history of CAD status post CABG, paroxysmal atrial fibrillation, chronic systolic heart failure was referred to the ED by her PCP for shortness of breath, the patient fell  4 days prior to admission, she did not loose consciousness since the fall she's been having back pain she said her PCP on the day of admission found her to be hypoxic and send her to the ED, M.D. she was found to have bilateral wheezing and crackles chest x-ray showing possible pneumonia with vascular congestion.  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   Due to increased cough, ct chest was done on 10/22 found to have complete right middle lobe bronchus occulsion, pulmonary consulted, (patient reported chocked on a piece of chicken, but was able to cough it out ) DG esophagus showed food impaction, [facility] consulted.  She had egd done on 10/24 , Ok per GI to restart eliquis 10/25.  Planning for pacemaker placement 10/26.  Hopefully can discharge 10/28.    Assessment/Plan:   Acute respiratory failure with hypoxia [facility]): ( initial presenting symptom with significant lower extremity edema) Patient presented to the hospital and admitted for sob /hypoxia and edema, likely due to acute decompensated diastolic heart failure initially. 2-D echo lvef wnl, does has diastolic dysfunction, cardiac biomarkers have been negative 2. She was treated with IV Lasix,  then switched to oral lasix on 10/20, follow strict is and os, daily weights.  crackles and lower extremity edema has much improved, working to wean off oxygen, down to room air most of 10/25  Complete right middle lobe bronchus occlusion with right middle lobe collapse from mucus plugging vs aspiration (she reported chocked on a piece of chicken prior to this) She was started on aztreonam and cleocin, antibiotics discontinued 10/25.   Pulmonary consulted - see bronchoscopy reports.    Food impaction at distal esophagus [facility] consulted, EGD on 10/24  paroxysmal Atrial fibrillation with controlled ventricular response (HCC): sinus rhythm in the hospital until 10/22, then developed tachybrady syndrome and transferred to stepdown Not on anticoagulation previously due to GI bleed. Cardiology recommends to restart eliquis.  Restarted 10/25, hold evening dose as she will be receiving pacemaker 10/26.   Cardiology consulted regarding sotalol in the setting of prolonged QT who recommended d/c sotalol Cardiology signed off and reconsulted to due afib/rvr, tachybrady and hypotension on 10/22, on dopamine drip, now weaned off dopamine drip, EP consulted: planning for pacemaker 10/26. Status post pacemaker placement 10/26 CHADSVASC score is 5. May want to consider Watchman evaluation in the near future  Prolonged QT: QTC on presentation D/c celexa,  Keep K.4, mag >2, Cardiology consulted regarding sotalol use in the setting of prolonged QT who recommended d/c sotalol Daily ekg to monitor Qtc, improving  HTN;  bp low normal on 10/20, reduce cozaar dose,  bp elevated on 10/22, add prn hydralazine Held  bp meds on 10/23 due to on dopamine drip, she was able to wean  off dopamine drip early am on 10/24  Thrombocytopenia: Platelets trending down Unclear etiology,  110>88, follow, will need outpatient hematology evaluation in the setting of anemia   Normocytic anemia: Does has h/o recurrent  gi bleed, no active bleed during this hospitalization anemia panel unremarkable  Right ear pain, right mastoid process tenderness Otoscope exam reveal opaque tympanic member , no fluids level, no perforation CT maxillofacial no acute findings, started on ear drops, flonase, doxycycline (stopped)  Falls/FTT/poor memory: Not sure questionable near syncopal has anything to do with the prolonged QTC of 600. PT recommended SNF, patient declines, family requested assisted living placement, social worker consulted, Pt only agrees to go home with HHPT, will stay with twin sister for support.    Prior smoker, quit smoking several months ago, h/o copd, no significant wheezing, does has rhonchi and crackles, continue nebs, added mucinex  DVT prophylaxis: eliquis restarted 10/25 Family Communication: patient ,  Disposition Plan/Barrier to D/C:   Blood pressure low last night, thrombocytopenia worsening Not stable for discharge today, patient may consider SNF if covered by insurance   Code Status: FULL    IV Access:    Peripheral IV   Procedures and diagnostic studies:   Dg Chest 2 View  Result Date: 12/27/2015 CLINICAL DATA:  Pacemaker EXAM: CHEST  2 VIEW COMPARISON:  12/24/2015 FINDINGS: Left pacer in place with leads in the right atrium and right ventricle. No pneumothorax. Prior CABG. Cardiomegaly. Minimal bibasilar atelectasis. No acute bony abnormality. IMPRESSION: Left pacer placement without pneumothorax. Cardiomegaly, bibasilar atelectasis. Electronically Signed   By: Charlett Nose M.D.   On: 12/27/2015 08:11     Medical Consultants:    cardiology  Anti-Infectives:   IV Rocephin  on 12/17/2015 Doxycycline started on 12/16/2015 to 10/22 Aztreonam and cleocin from 10/22 to 10/25  Subjective:    Gabrielle [name]  denies any chest pain, shortness of breath  Objective:    Vitals:   12/26/15 2004 12/26/15 2039 12/26/15 2200 12/27/15 0439  BP: (!) 123/48  (!) 107/40  (!) 129/56  Pulse: 70  71 (!) 59  Resp: 18  16 16   Temp: 98.6 F (37 C)  98.7 F (37.1 C) 98.6 F (37 C)  TempSrc: Oral  Oral Oral  SpO2: 98% 95% 94% 97%  Weight:    77.7 kg (171 lb 3.2 oz)  Height:        Intake/Output Summary (Last 24 hours) at 12/27/15 0825 Last data filed at 12/27/15 1610  Gross per 24 hour  Intake              240 ml  Output              301 ml  Net              -61 ml   Filed Weights   12/24/15 1726 12/26/15 0519 12/27/15 0439  Weight: 76.2 kg (168 lb) 75.9 kg (167 lb 6.4 oz) 77.7 kg (171 lb 3.2 oz)    Exam: General exam: frail, but NAD Respiratory system:  No wheezing this am, does has mild crackles at basis Cardiovascular system: S1 & S2 heard, RRR.  Gastrointestinal system: Abdomen is nondistended, soft and nontender.  Central nervous system: Alert and oriented. No focal neurological deficits. Extremities: + lower extremity pitting edema slowing improving Skin: No rashes, lesions or ulcers Psychiatry: Judgement and insight appear normal. Mood & affect appropriate.    Data Reviewed:    Labs: Basic Metabolic Panel:  Recent Labs  Lab 12/21/15 0533  12/23/15 0601 12/24/15 0326 12/25/15 0530 12/26/15 0524 12/26/15 1643 12/27/15 0152  NA 141  < > 139 138 138 141  --  138  K 4.1  < > 4.5 4.4 4.1 4.3  --  4.3  CL 99*  < > 103 105 106 107  --  107  CO2 34*  < > 30 26 28 30   --  27  GLUCOSE 103*  < > 134* 119* 91 109*  --  166*  BUN 17  < > 21* 17 16 16   --  16  CREATININE 1.06*  < > 1.21* 0.89 0.91 1.03* 1.02* 0.99  CALCIUM 9.1  < > 9.0 9.2 9.2 9.4  --  9.2  MG 1.9  --  1.6* 2.3 2.1  --   --   --   < > = values in this interval not displayed. GFR Estimated Creatinine Clearance: 49.5 mL/min (by C-G formula based on SCr of 0.99 mg/dL). Liver Function Tests:  Recent Labs Lab 12/21/15 0533 12/22/15 0531 12/23/15 0601 12/24/15 0326  AST 34 31 32 32  ALT 10* 10* 11* 12*  ALKPHOS 83 83 80 89  BILITOT 1.7* 1.7* 2.0* 1.7*  PROT 5.4*  5.6* 5.7* 5.9*  ALBUMIN 3.0* 3.1* 3.2* 3.4*   No results for input(s): LIPASE, AMYLASE in the last 168 hours. No results for input(s): AMMONIA in the last 168 hours. Coagulation profile No results for input(s): INR, PROTIME in the last 168 hours.  CBC:  Recent Labs Lab 12/21/15 0533  12/24/15 0326 12/25/15 0530 12/26/15 0524 12/26/15 1643 12/27/15 0152  WBC 8.2  < > 12.7* 9.2 8.4 6.2 6.4  NEUTROABS 5.7  --   --   --   --   --   --   HGB 10.8*  < > 11.6* 10.6* 10.6* 10.2* 9.8*  HCT 33.3*  < > 36.3 32.3* 33.8* 32.0* 30.8*  MCV 97.1  < > 97.6 97.6 99.4 97.3 97.5  PLT 118*  < > 137* 110* 121* 90* 88*  < > = values in this interval not displayed. Cardiac Enzymes:  Recent Labs Lab 12/21/15 1123 12/21/15 1755 12/22/15 0005  TROPONINI <0.03 <0.03 <0.03   BNP (last 3 results) No results for input(s): PROBNP in the last 8760 hours. CBG: No results for input(s): GLUCAP in the last 168 hours. D-Dimer: No results for input(s): DDIMER in the last 72 hours. Hgb A1c: No results for input(s): HGBA1C in the last 72 hours. Lipid Profile: No results for input(s): CHOL, HDL, LDLCALC, TRIG, CHOLHDL, LDLDIRECT in the last 72 hours. Thyroid function studies: No results for input(s): TSH, T4TOTAL, T3FREE, THYROIDAB in the last 72 hours.  Invalid input(s): FREET3 Anemia work up: No results for input(s): VITAMINB12, FOLATE, FERRITIN, TIBC, IRON, RETICCTPCT in the last 72 hours. Sepsis Labs:  Recent Labs Lab 12/25/15 0530 12/26/15 0524 12/26/15 1643 12/27/15 0152  WBC 9.2 8.4 6.2 6.4   Microbiology Recent Results (from the past 240 hour(s))  Culture, sputum-assessment     Status: None   Collection Time: 12/18/15  7:03 AM  Result Value Ref Range Status   Specimen Description SPUTUM  Final   Special Requests NONE  Final   Sputum evaluation   Final    THIS SPECIMEN IS ACCEPTABLE. RESPIRATORY CULTURE REPORT TO FOLLOW.   Report Status 12/18/2015 FINAL  Final  Culture, respiratory  (NON-Expectorated)     Status: None   Collection Time: 12/18/15  7:03 AM  Result Value  Ref Range Status   Specimen Description SPUTUM  Final   Special Requests NONE  Final   Gram Stain   Final    MODERATE WBC PRESENT,BOTH PMN AND MONONUCLEAR FEW SQUAMOUS EPITHELIAL CELLS PRESENT ABUNDANT GRAM POSITIVE COCCI IN CLUSTERS MODERATE GRAM POSITIVE COCCI IN PAIRS FEW YEAST WITH PSEUDOHYPHAE FEW GRAM NEGATIVE COCCOBACILLI RARE GRAM NEGATIVE DIPLOCOCCI    Culture   Final    Consistent with normal respiratory flora. [facility]    Report Status 12/20/2015 FINAL  Final  Urine culture     Status: Abnormal   Collection Time: 12/23/15 10:59 AM  Result Value Ref Range Status   Specimen Description URINE, CLEAN CATCH  Final   Special Requests NONE  Final   Culture MULTIPLE SPECIES PRESENT, SUGGEST RECOLLECTION (A)  Final   Report Status 12/25/2015 FINAL  Final  Surgical pcr screen     Status: None   Collection Time: 12/26/15 11:36 AM  Result Value Ref Range Status   MRSA, PCR NEGATIVE NEGATIVE Final   Staphylococcus aureus NEGATIVE NEGATIVE Final    Comment:        The Xpert SA Assay (FDA approved for NASAL specimens in patients over 72 years of age), is one component of a comprehensive surveillance program.  Test performance has been validated by [facility] for patients greater than or equal to 72 year old. It is not intended to diagnose infection nor to guide or monitor treatment.      Medications:   . atorvastatin  20 mg Oral QPM  . ciprofloxacin-dexamethasone  4 drop Both Ears BID  . feeding supplement (ENSURE ENLIVE)  237 mL Oral Q24H  . fluticasone  2 spray Each Nare BID  . furosemide  20 mg Oral Daily  . guaiFENesin  600 mg Oral BID  . heparin subcutaneous  5,000 Units Subcutaneous Q8H  . levalbuterol  0.63 mg Nebulization BID  . losartan  25 mg Oral Daily  . mouth rinse  15 mL Mouth Rinse BID  . mometasone-formoterol  2 puff Inhalation BID  .  pantoprazole  40 mg Oral Daily  . polyethylene glycol  17 g Oral Daily  . senna-docusate  1 tablet Oral BID  . sodium chloride flush  3 mL Intravenous Q12H  . sucralfate  1 g Oral TID WC & HS   Continuous Infusions:     LOS: 11 days    Time spent: 25 mins  [facility] LLCBROL,Imir Brumbach MD  [facility] Pager [phone]  *Please refer to [email or web address], password [email or web address] to get updated schedule on who will round on this patient, as hospitalists switch teams weekly. If 7PM-7AM, please contact night-coverage at [email or web address], password [email or web address] for any overnight needs.  12/27/2015, 8:25 AM

## 2015-12-27 NOTE — Progress Notes (Signed)
SUBJECTIVE: The patient is doing well today.  At this time, she denies chest pain, shortness of breath, or any new concerns.  Marland Kitchen atorvastatin  20 mg Oral QPM  . ciprofloxacin-dexamethasone  4 drop Both Ears BID  . feeding supplement (ENSURE ENLIVE)  237 mL Oral Q24H  . fluticasone  2 spray Each Nare BID  . furosemide  20 mg Oral Daily  . guaiFENesin  600 mg Oral BID  . heparin subcutaneous  5,000 Units Subcutaneous Q8H  . levalbuterol  0.63 mg Nebulization BID  . losartan  25 mg Oral Daily  . mouth rinse  15 mL Mouth Rinse BID  . mometasone-formoterol  2 puff Inhalation BID  . pantoprazole  40 mg Oral Daily  . polyethylene glycol  17 g Oral Daily  . senna-docusate  1 tablet Oral BID  . sodium chloride flush  3 mL Intravenous Q12H  . sucralfate  1 g Oral TID WC & HS      OBJECTIVE: Physical Exam: Vitals:   12/26/15 2004 12/26/15 2039 12/26/15 2200 12/27/15 0439  BP: (!) 123/48  (!) 107/40 (!) 129/56  Pulse: 70  71 (!) 59  Resp: 18  16 16   Temp: 98.6 F (37 C)  98.7 F (37.1 C) 98.6 F (37 C)  TempSrc: Oral  Oral Oral  SpO2: 98% 95% 94% 97%  Weight:    171 lb 3.2 oz (77.7 kg)  Height:        Intake/Output Summary (Last 24 hours) at 12/27/15 0856 Last data filed at 12/27/15 4098  Gross per 24 hour  Intake              240 ml  Output              301 ml  Net              -61 ml    Telemetry reveals SR, occ A and V pacing  GEN- The patient is well appearing, alert and oriented x 3 today.   Head- normocephalic, atraumatic Eyes-  Sclera clear, conjunctiva pink Ears- hearing intact Oropharynx- clear Neck- supple, no JVP Lungs- Clear to ausculation bilaterally, normal work of breathing Heart- Regular rate and rhythm, no significant murmurs, no rubs or gallops GI- soft, NT, ND Extremities- no clubbing, cyanosis, no edema Skin- no rash or lesion Psych- euthymic mood, full affect Neuro- no gross deficits appreciated  PPM implant site is dry, no hematoma, is  stable  LABS: Basic Metabolic Panel:  Recent Labs  11/91/47 0530 12/26/15 0524 12/26/15 1643 12/27/15 0152  NA 138 141  --  138  K 4.1 4.3  --  4.3  CL 106 107  --  107  CO2 28 30  --  27  GLUCOSE 91 109*  --  166*  BUN 16 16  --  16  CREATININE 0.91 1.03* 1.02* 0.99  CALCIUM 9.2 9.4  --  9.2  MG 2.1  --   --   --    CBC:  Recent Labs  12/26/15 1643 12/27/15 0152  WBC 6.2 6.4  HGB 10.2* 9.8*  HCT 32.0* 30.8*  MCV 97.3 97.5  PLT 90* 88*    RADIOLOGY: Dg Chest 2 View Result Date: 12/27/2015 CLINICAL DATA:  Pacemaker EXAM: CHEST  2 VIEW COMPARISON:  12/24/2015 FINDINGS: Left pacer in place with leads in the right atrium and right ventricle. No pneumothorax. Prior CABG. Cardiomegaly. Minimal bibasilar atelectasis. No acute bony abnormality. IMPRESSION: Left pacer placement  without pneumothorax. Cardiomegaly, bibasilar atelectasis. Electronically Signed   By: Charlett NoseKevin  Dover M.D.   On: 12/27/2015 08:11     ASSESSMENT AND PLAN:   1. Parosysmal atrial fibrillation with tachy-brady syndrome  - Failed Sotalol due to QT prolongation.  CHA2DS2Vasc is at least 5, not on anticoagulation due to history of recent GI bleeds.   Recommend f/u with her primary cardiologist/GI to re-evaluate and an out patient  Now is s/p PPM implant [facility] with stable/normal function CXR this morning is without pneumothorax Wound is stable Wound care, activity restrictions discussed with the patient/written instructions provided Will start betablocker Routine post pacer f/u arranged The patient has been seen and examined by Dr. Ladona Ridgelaylor, and cleared for discharge from EP standpoint   2. Prolonged QTc  - avoid QT prolonging agents. Mg stable at 2.1 on 10/25. K+ 4.3 this AM.   3. Chronic diastolic CHF  - Echocardiogram shows preserved EF of 55-60% with grade 3diastolic dysfunction. BNP elevated to 751 on admission. No c/o SOB this morning, exam  appears euvolemic, no pulmonary edema on CXR this AM Cummulative fluid negative -3846ml   4. RML collapse and mucus plugging vs Aspiration  - per Pulmonology.  5. Bleeding esophageal ulcers suspected due to reflux by EGD 12/24/15 - per GI.      EP remains available, please recall if needed  Francis DowseRenee Ursuy, Cordelia Poche-C 12/27/2015 8:56 AM  EP Attending   Patient seen and examined. Agree with above. She is doing well, s/p PPM insertion. CXR and device interogation are good. We will arrange followup in our office for an incision check in 1-2 weeks. I will see in 3 months.   Leonia ReevesGregg Meggin Ola,M.D.

## 2015-12-27 NOTE — Progress Notes (Signed)
Physical Therapy Treatment Patient Details Name: Gabrielle [name] MRN: [id number] DOB: [birth year] Today's Date: 12/27/2015    History of Present Illness Pt is a 70 y.o. female with PMH of CAD status post CABG, paroxysmal atrial fibrillation, chronic systolic heart failure, COPD, hypertension, hyperlipidemia admitted for acute respiratory failure with hypoxia. During current hospitalization, pt taken to the ICU for bradycardia, hypotension and right middle lobe collapse.     PT Comments    Pt is tolerating a walk with pulse 65 to initiate gait and pacing her progress on the hall.  Pt is first asking to take an extremely long walk but demonstrated good judgment for setting a stopping point with PT not having to ask her.  Continue acutely with all her mobility and monitor vitals as well to progress toward DC home with her family.  Follow Up Recommendations        Equipment Recommendations       Recommendations for Other Services       Precautions / Restrictions Precautions Precautions: Fall Precaution Booklet Issued: No Restrictions Weight Bearing Restrictions: Yes LUE Weight Bearing: Non weight bearing    Mobility  Bed Mobility Overal bed mobility: Needs Assistance Bed Mobility: Supine to Sit;Sit to Supine Rolling: Min guard   Supine to sit: Min assist Sit to supine: Mod assist   General bed mobility comments: assist due to limitations of her surgery  Transfers Overall transfer level: Needs assistance Equipment used: 1 person hand held assist Transfers: Sit to/from Stand;Stand Pivot Transfers Sit to Stand: Min guard Stand pivot transfers: Min guard          Ambulation/Gait Ambulation/Gait assistance: Min guard;Min assist Ambulation Distance (Feet): 100 Feet Assistive device: 1 person hand held assist Gait Pattern/deviations: Step-through pattern;Wide base of support;Decreased stride length Gait velocity: decreased Gait velocity interpretation: Below normal  speed for age/gender General Gait Details: lateral instability with tendency to list to the R   Stairs            Wheelchair Mobility    Modified Rankin (Stroke Patients Only)       Balance                                    Cognition Arousal/Alertness: Awake/alert Behavior During Therapy: WFL for tasks assessed/performed Overall Cognitive Status: Within Functional Limits for tasks assessed                      Exercises      General Comments        Pertinent Vitals/Pain Pain Assessment: No/denies pain    Home Living                      Prior Function            PT Goals (current goals can now be found in the care plan section) Acute Rehab PT Goals Patient Stated Goal: to get home with her family Progress towards PT goals: Progressing toward goals    Frequency    Min 3X/week      PT Plan Current plan remains appropriate    Co-evaluation             End of Session Equipment Utilized During Treatment: Gait belt Activity Tolerance: Patient tolerated treatment well Patient left: in bed;with call bell/phone within reach;with bed alarm set;with family/visitor present     Time: 0865-7846 PT  Time Calculation (min) (ACUTE ONLY): 24 min  Charges:  $Gait Training: 8-22 mins $Therapeutic Activity: 8-22 mins                    G Codes:      Ivar DrapeStout, Maie Kesinger E 12/27/2015, 1:50 PM    Samul Dadauth Teal Bontrager, PT MS Acute Rehab Dept. Number: [facility] [id number] and [facility] [phone]

## 2015-12-27 NOTE — Progress Notes (Signed)
Speech Language Pathology Treatment: Dysphagia  Patient Details Name: Gabrielle [name] MRN: [id number] DOB: April 06, 1945 Today's Date: 12/27/2015 Time: 0321-2248 SLP Time Calculation (min) (ACUTE ONLY): 10 min  Assessment / Plan / Recommendation Clinical Impression  Pt doing much better with swallowing.  Since initial evaluation, has been followed by GI, had EGD and barium swallow, dx with severe esophagitis.  Observed to tolerate liquids much better and was instructed by Dr. Oletta Lamas to slowly advance to soft solids.  No SLP f/u is warranted - our services will sign off.     HPI HPI: 70 y.o.femalewith CAD status post CABG, paroxysmal atrial fibrillation, chronic systolic heart failure, COPD, hypertension, hyperlipidemia was referred to the ER by patient's PCP because of shortness of breath.  Admitted with dx of acute resp failure with hypoxia.  Rapid Response called 10/23 due to episode of bradycardia; transferred to ICU.   Pt reports 2-3 day hx of globus, regurgitation of solids.  Clinical swallow evaluation 10/23: pt with c/o globus and observed regurgitation of breakfast meal.  Recs for esophageal f/u.  Barium swallow 10/23: Near complete obstruction of the distal esophagus with filling defects and a very slow trickle of contrast through the distal esophagus into the stomach. Findings concerning for fecal impaction. Cannot exclude partially obstructing underlying distal mass. Upper endoscopy 10/23: Few superficial esophageal ulcers with oozing blood and stigmata of recent bleeding were found at the gastroesophageal junction. The remainder the esophagus was normal. Of note, the GE junction was widely patent and there was no food material in the esophagus.      SLP Plan  All goals met     Recommendations  Diet recommendations: Thin liquid Medication Administration: Whole meds with liquid                Oral Care Recommendations: Oral care BID Plan: All goals met       GO                 Juan Quam Laurice 12/27/2015, 11:06 AM

## 2015-12-27 NOTE — Care Management Note (Signed)
Case Management Note Previous CM note initiated by Lanier ClamMahabir, Kathy, RN 12/25/2015, 11:27 AM    Patient Details  Name: Gabrielle [name] MRN: [id number] Date of Birth: 31-Jan-1946  Subjective/Objective:    PT recc HHPT. Provided patient w/HHC agency list for choice-chose [facility]-rep Darl PikesSusan aware, & following-await HHTP,[id number] order. Patient will stay w/twin sister:Gabrielle [name] c#[phone] address:[address] [place].Noted on 02-will monitor if needed @ home.                Action/Plan:d/c home w/HHC.   Expected Discharge Date:                 Expected Discharge Plan:  Home w Home Health Services  In-House Referral:  Clinical Social Work  Discharge planning Services  CM Consult  Post Acute Care Choice:  Durable Medical Equipment, Home Health Choice offered to:  Patient  DME Arranged:  3-N-1 DME Agency:  [facility]  HH Arranged:  PT HH Agency:  [facility]  Status of Service:  Completed, signed off  If discussed at Long Length of Stay Meetings, dates discussed:    Additional Comments:  12/27/15- 1200- Manuel Lawhead RN, CM- order placed for HHPT- [facility] spoke with Clydie BraunKaren and confirmed that referral had been received- pt also wants a 3n1- MD notified and order placed- [facility]- 3n1 to be delivered to room prior to discharge.   Zenda AlpersWebster, GrimsleyKristi Hall, RN 12/27/2015, 1:54 PM [phone]

## 2015-12-28 DIAGNOSIS — K2211 Ulcer of esophagus with bleeding: Secondary | ICD-10-CM

## 2015-12-28 DIAGNOSIS — I5033 Acute on chronic diastolic (congestive) heart failure: Secondary | ICD-10-CM

## 2015-12-28 DIAGNOSIS — D696 Thrombocytopenia, unspecified: Secondary | ICD-10-CM

## 2015-12-28 LAB — COMPREHENSIVE METABOLIC PANEL
ALT: 15 U/L (ref 14–54)
AST: 46 U/L — ABNORMAL HIGH (ref 15–41)
Albumin: 2.4 g/dL — ABNORMAL LOW (ref 3.5–5.0)
Alkaline Phosphatase: 88 U/L (ref 38–126)
Anion gap: 4 — ABNORMAL LOW (ref 5–15)
BUN: 13 mg/dL (ref 6–20)
CO2: 28 mmol/L (ref 22–32)
Calcium: 9.4 mg/dL (ref 8.9–10.3)
Chloride: 105 mmol/L (ref 101–111)
Creatinine, Ser: 0.87 mg/dL (ref 0.44–1.00)
GFR calc Af Amer: >60 mL/min (ref >60)
GFR calc non Af Amer: >60 mL/min (ref >60)
Glucose, Bld: 127 mg/dL — ABNORMAL HIGH (ref 65–99)
Potassium: 4.7 mmol/L (ref 3.5–5.1)
Sodium: 137 mmol/L (ref 135–145)
Total Bilirubin: 1.4 mg/dL — ABNORMAL HIGH (ref 0.3–1.2)
Total Protein: 4.5 g/dL — ABNORMAL LOW (ref 6.5–8.1)

## 2015-12-28 LAB — CBC
HCT: 30.8 % — ABNORMAL LOW (ref 36.0–46.0)
Hemoglobin: 9.9 g/dL — ABNORMAL LOW (ref 12.0–15.0)
MCH: 30.9 pg (ref 26.0–34.0)
MCHC: 32.1 g/dL (ref 30.0–36.0)
MCV: 96.3 fL (ref 78.0–100.0)
Platelets: 87 10*3/uL — ABNORMAL LOW (ref 150–400)
RBC: 3.2 MIL/uL — ABNORMAL LOW (ref 3.87–5.11)
RDW: 17.3 % — ABNORMAL HIGH (ref 11.5–15.5)
WBC: 7.4 10*3/uL (ref 4.0–10.5)

## 2015-12-28 LAB — GLUCOSE, CAPILLARY: Glucose-Capillary: 137 mg/dL — ABNORMAL HIGH (ref 65–99)

## 2015-12-28 LAB — MAGNESIUM: Magnesium: 1.7 mg/dL (ref 1.7–2.4)

## 2015-12-28 MED ORDER — LOSARTAN POTASSIUM 50 MG PO TABS: 25.0000 mg | ORAL_TABLET | Freq: Every day | ORAL | 0 refills | Status: DC

## 2015-12-28 MED ORDER — METOPROLOL SUCCINATE ER 25 MG PO TB24: 25.0000 mg | ORAL_TABLET | Freq: Every day | ORAL | 0 refills | Status: DC

## 2015-12-28 MED ORDER — SUCRALFATE 1 GM/10ML PO SUSP: 1.0000 g | Freq: Three times a day (TID) | ORAL | 2 refills | Status: DC

## 2015-12-28 NOTE — Progress Notes (Signed)
PT received call from RN regarding d/c recommendations. Reviewed previous PT notes and it appears that pt is declining further rehab at a SNF level. Recommending home health PT to follow upon d/c. Does not appear that DME is needed. PT writing this note per MD/RN request. If there are any further questions please page me at [phone]. Thank you.   Conni SlipperLaura Myrella Fahs, PT, DPT Acute Rehabilitation Services Pager: [phone]

## 2015-12-28 NOTE — Discharge Summary (Signed)
Physician Discharge Summary  [name] AVW:[id number] DOB: 1946/02/23  PCP: [name], MD  Admit date: 12/16/2015 Discharge date: 12/28/2015  Recommendations for Outpatient Follow-up:  1. Dr. [name], PCP in one week with repeat labs (CBC & BMP). 2. Dr. Shan Levans, Pulmonology in 3 weeks: Follow-up regarding COPD and CT chest findings (nodular interstitial opacities at the lung bases) 3. Dr. Carman Ching, [facility] in 1 month 4. Cardiology for pacemaker wound check on 01/02/16 at 3:30 PM 5. Dr. Donato Schultz, Cardiology on 01/16/16 at 10:45 AM 6. Dr. Lewayne Bunting, EP Cardiology on 03/31/16 at 10 AM 7. Recommend repeating chest x-ray in 3-4 weeks.  Home Health: PT Equipment/Devices: 3 n 1    Discharge Condition: Improved and stable  CODE STATUS: Full  Diet recommendation: full liquids for a day or 2 and slowly add mashed potatoes pancakes and other soft foods  Discharge Diagnoses:  Principal Problem:   Acute respiratory failure with hypoxia (HCC) Active Problems:   COPD exacerbation (HCC)   Atrial fibrillation with controlled ventricular response (HCC)   Coronary artery disease due to lipid rich plaque   Acute on chronic diastolic CHF (congestive heart failure) (HCC)   Community acquired pneumonia of right middle lobe of lung (HCC)   Fall   Prolonged Q-T interval on ECG   Chronic anticoagulation   Dysphagia   Tachy-brady syndrome (HCC)   Pressure injury of skin   Esophageal ulcer with bleeding   Thrombocytopenia (HCC)   Acute congestive heart failure (HCC)   SOB (shortness of breath)   Brief/Interim Summary: 70 year old female with history of CAD status post CABG, PAF not on anticoagulation secondary to GI bleed in August 2017, stage III chronic kidney disease, hyper lipidemia, bradycardia presented to [facility] long ED on 12/16/15 for increased falls and generalized weakness. She had been recently seen by her primary cardiologist in the office on 11/12/15  following recent hospitalization 10/2015 at which time she was found to have hemoglobin of 4 and required 6 units of PRBC transfusion. The blood loss was felt to be secondary to diverticular bleed. At the time of office visit it was decided not to resume anticoagulation as this was her second hospitalization secondary to GI bleed. She was continued on sotalol as metoprolol had been discontinued in the past due to bradycardia. On the day of presentation, she had been seen by her PCP for generalized weakness and was found to be hypoxic and sent to ED for further evaluation. She was started on doxycycline for presumed pneumonia and IV Lasix for decompensated CHF. She was seen in the hospital by cardiology service. On 12/23/15, she developed tachybradycardia syndrome. Cardizem drip was discontinued. She was resuscitated with atropine and dopamine drip. She was transferred to stepdown. CT chest 10/22 showed complete right middle lobe bronchus occlusion and pulmonary was consulted. Patient was eventually transferred to [facility] for further evaluation and management by EP cardiology.  Assessment and plan  1. Acute respiratory failure with hypoxia: Felt to be due to acute decompensated diastolic CHF. 2-D echo shows normal LVEF, has diastolic dysfunction. Cardiac biomarkers negative. Initially treated with IV Lasix and then transitioned to oral Lasix. Clinically CHF is compensated. Hypoxia resolved. 2. Acute on chronic diastolic CHF: Echocardiogram on admission showed LVEF 55-60 percent and grade 3 diastolic dysfunction. BNP elevated at 751 on admission. Initially treated with IV Lasix and then transitioned to oral Lasix. Clinically compensated. 3. Right middle lobe bronchus occlusion/pneumonia: With right middle lobe collapse from mucous plugging  versus aspiration (she reported choking on a piece of chicken prior to this). Completed a course of antibiotics (aztreonam and Cleocin). Pulmonology consulted and was treated  conservatively without fiberoptic bronchoscopy and clinically improved. Recommend outpatient follow-up of chest x-ray in 3-4 weeks. 4. Food impaction at distal esophagus/bleeding esophageal ulcers(Ulcerative Esophagitis). [facility] was consulted and underwent EGD on 10/24. 5. Paroxysmal A. fib, status post pacemaker placement this admission: Was in sinus rhythm in the hospital until 10/22, then developed tachybradycardia syndrome and was transferred to stepdown unit. Not on anticoagulation previously due to GI bleed. Cardiology consulted for tachycardia/bradycardia and hypotension on 10/22, weaned off of dopamine drip, EDP consulted and patient underwent pacemaker placement on 12/26/15.CHA2DS2-VASc: 5. Defer anticoagulation decision to her primary and EP cardiologist during outpatient follow-up. As per cardiology follow-up note 10/24: Patient found to have esophageal ulcers on EGD and will likely need to allow healing of this before resuming long-term anticoagulation. As per prior records, may want to consider watchful evaluation in the near future. 6. Prolonged QT: QTC on presentation 602 ms. Celexa discontinued. Sotalol was discontinued 2. Periodically monitor EKG as outpatient. Her fall prior to admission felt to be mechanical in nature and probably unrelated to the prolonged QTC. Last EKG 10/27: QTC 471 ms 7. Essential hypertension: Cozaar dose was reduced due to hypotension. Toprol-XL initiated during this admission. Stable. Outpatient follow-up. 8. Thrombocytopenia: Unclear etiology. Platelet counts stable in the 80-90 range for the last 3 days. Follow CBCs as outpatient and may consider hematology consultation. 9. Normocytic anemia: Does have history of recurrent GI bleed. No active bleeding during this hospitalization. Hemoglobin stable for the last several days. Follow CBCs as outpatient. 10. Right ear pain: Otoscopic exam revealed opaque tympanic membrane without fluid level or perforation. CT  maxillofacial without acute findings. Completed a course of antibiotic and steroid eardrops. Resolved. 11. Fall/failure to thrive/poor memory: Initially PT recommended SNF which patient declined. PT has reevaluated today and recommend home health. Patient will be staying with her twin sister upon discharge. 12. Former smoker: Quit several months ago. 13. COPD: Stable. Exacerbation resolved. 14. CAD status post CABG 15. Chronic kidney disease stage III: Stable 16. Depression: Was taken off Celexa during this admission due to prolonged QT. No suicidal or homicidal ideations. Recommend outpatient follow-up with PCP to consider changing to a new agent.   Discharge Instructions  Discharge Instructions    (HEART FAILURE PATIENTS) Call MD:  Anytime you have any of the following symptoms: 1) 3 pound weight gain in 24 hours or 5 pounds in 1 week 2) shortness of breath, with or without a dry hacking cough 3) swelling in the hands, feet or stomach 4) if you have to sleep on extra pillows at night in order to breathe.    Complete by:  As directed    Call MD for:  difficulty breathing, headache or visual disturbances    Complete by:  As directed    Call MD for:  extreme fatigue    Complete by:  As directed    Call MD for:  persistant dizziness or light-headedness    Complete by:  As directed    Call MD for:  persistant nausea and vomiting    Complete by:  As directed    Call MD for:  severe uncontrolled pain    Complete by:  As directed    Call MD for:  temperature >100.4    Complete by:  As directed    DME Bedside commode  Complete by:  As directed    Discharge instructions    Complete by:  As directed    DIET: full liquids for a day or 2 and slowly add mashed potatoes pancakes and other soft foods.   Face-to-face encounter (required for Medicare/Medicaid patients)    Complete by:  As directed    I Xu,Fang certify that this patient is under my care and that I, or a nurse practitioner or  physician's assistant working with me, had a face-to-face encounter that meets the physician face-to-face encounter requirements with this patient on 12/20/2015. The encounter with the patient was in whole, or in part for the following medical condition(s) which is the primary reason for home health care (List medical condition): FTT   The encounter with the patient was in whole, or in part, for the following medical condition, which is the primary reason for home health care:  FTT   I certify that, based on my findings, the following services are medically necessary home health services:   Nursing Physical therapy     Reason for Medically Necessary Home Health Services:  Skilled Nursing- Change/Decline in Patient Status   My clinical findings support the need for the above services:  Shortness of breath with activity   Further, I certify that my clinical findings support that this patient is homebound due to:  Shortness of Breath with activity   For home use only DME oxygen    Complete by:  As directed    Mode or (Route):  Nasal cannula   Frequency:  Continuous (stationary and portable oxygen unit needed)   Oxygen delivery system:  Gas   Home Health    Complete by:  As directed    To provide the following care/treatments:   PT OT RN Social work Home Health Aide     Increase activity slowly    Complete by:  As directed        Medication List    STOP taking these medications   citalopram 20 MG tablet Commonly known as:  CELEXA   clindamycin 300 MG capsule Commonly known as:  CLEOCIN   sotalol 80 MG tablet Commonly known as:  BETAPACE     TAKE these medications   albuterol 108 (90 Base) MCG/ACT inhaler Commonly known as:  PROAIR HFA INHALE 2 PUFFS INTO THE LUNGS EVERY 6 (SIX) HOURS AS NEEDED. What changed:  how much to take  how to take this  when to take this  reasons to take this  additional instructions   ALPRAZolam 0.5 MG tablet Commonly known as:   XANAX Take 1 tablet (0.5 mg total) by mouth 2 (two) times daily as needed for anxiety.   atorvastatin 20 MG tablet Commonly known as:  LIPITOR TAKE 1 TABLET (20 MG TOTAL) BY MOUTH DAILY. What changed:  how much to take  how to take this  when to take this  additional instructions   budesonide-formoterol 160-4.5 MCG/ACT inhaler Commonly known as:  SYMBICORT Inhale 2 puffs into the lungs 2 (two) times daily.   clobetasol cream 0.05 % Commonly known as:  TEMOVATE Apply 1 application topically daily as needed (itching).   docusate sodium 100 MG capsule Commonly known as:  COLACE Take 100 mg by mouth daily as needed for moderate constipation.   fluticasone 50 MCG/ACT nasal spray Commonly known as:  FLONASE Place 2 sprays into both nostrils daily.   furosemide 20 MG tablet Commonly known as:  LASIX Take 1 tablet (20 mg total)  by mouth daily. May take additional 20 mg a day for swelling as needed What changed:  additional instructions   guaiFENesin 600 MG 12 hr tablet Commonly known as:  MUCINEX Take 1 tablet (600 mg total) by mouth 2 (two) times daily.   losartan 50 MG tablet Commonly known as:  COZAAR Take 0.5 tablets (25 mg total) by mouth daily. What changed:  See the new instructions.   metoprolol succinate 25 MG 24 hr tablet Commonly known as:  TOPROL-XL Take 1 tablet (25 mg total) by mouth daily. Start taking on:  12/29/2015   oxymetazoline 0.05 % nasal spray Commonly known as:  AFRIN Place 2 sprays into both nostrils 2 (two) times daily as needed for congestion.   pantoprazole 40 MG tablet Commonly known as:  PROTONIX Take 40 mg by mouth daily before breakfast.   polyethylene glycol packet Commonly known as:  MIRALAX / GLYCOLAX Take 17 g by mouth daily as needed for mild constipation.   sucralfate 1 GM/10ML suspension Commonly known as:  CARAFATE Take 10 mLs (1 g total) by mouth 4 (four) times daily -  with meals and at bedtime.   SYSTANE ULTRA  0.4-0.3 % Soln Generic drug:  Polyethyl Glycol-Propyl Glycol Place 1 drop into both eyes daily as needed (for dry eyes).   tiZANidine 4 MG tablet Commonly known as:  ZANAFLEX Take 4 mg by mouth at bedtime as needed for muscle spasms.   traMADol 50 MG tablet Commonly known as:  ULTRAM Take 1 tablet (50 mg total) by mouth every 6 (six) hours as needed for moderate pain.      Follow-up Information    Shan Levans, MD Follow up in 3 week(s).   Specialty:  Pulmonary Disease Why:  copd and ct chest findings" Nodular interstitial opacities at the lung bases." Contact information: [address] [place] [address] [phone]        FULP, CAMMIE, MD Follow up in 1 week(s).   Specialty:  Family Medicine Why:  hospital discharge follow up, repeat cbc/bmp at follow up.  Contact information: [address] [place] [address] [phone]        Donato Schultz, MD Follow up on 01/16/2016.   Specialty:  Cardiology Contact information: [address] [place] [address] [phone]        [facility] [place] [place] Office Follow up on 01/02/2016.   Specialty:  Cardiology Why:  3:30PM, wound check/pacemaker Contact information: [address] [place] [address] [phone]       Lewayne Bunting, MD Follow up on 03/31/2016.   Specialty:  Cardiology Why:  10:00AM Contact information: [address] [place] [address] [phone]        [facility] .   Why:  HHPT arranged- they will call you to set up home visits Contact information: [address] [place] [address] [phone]        [facility] - Dme [facility] .   Why:  3n1 arranged- to be delivered to room prior to discharge.  Contact information: [address] [place] [address] [phone]        Vertell Novak, MD. Schedule an appointment as soon as possible for a visit in 1  month(s).   Specialty:  Gastroenterology Contact information: [address] [place] [address] [phone]          Allergies  Allergen Reactions  .  Novocain [Procaine] Hives and Other (See Comments)    palpitations  . Penicillins Hives    Has patient had a PCN reaction causing immediate rash, facial/tongue/throat swelling, SOB or lightheadedness with hypotension: Yes Has patient had a PCN reaction causing severe rash involving mucus membranes or skin necrosis: No Has patient had a PCN reaction that required hospitalization No Has patient had a PCN reaction occurring within the last 10 years: No If all of the above answers are "NO", then may proceed with Cephalosporin use.    Consultations:  Cardiology  EP Cardiology  [facility]  PCCM   Procedures/Studies: Dg Chest 2 View  Result Date: 12/27/2015 CLINICAL DATA:  Pacemaker EXAM: CHEST  2 VIEW COMPARISON:  12/24/2015 FINDINGS: Left pacer in place with leads in the right atrium and right ventricle. No pneumothorax. Prior CABG. Cardiomegaly. Minimal bibasilar atelectasis. No acute bony abnormality. IMPRESSION: Left pacer placement without pneumothorax. Cardiomegaly, bibasilar atelectasis. Electronically Signed   By: Charlett Nose M.D.   On: 12/27/2015 08:11   Dg Chest 2 View  Result Date: 12/21/2015 CLINICAL DATA:  New onset productive cough this morning with shortness-of-breath and weakness. Mild hypoxia. EXAM: CHEST  2 VIEW COMPARISON:  12/16/2015, 11/27/2009 and chest CT 12/18/2015 FINDINGS: Median sternotomy wires are unchanged. Lungs are adequately inflated with minimal linear scarring over the left midlung and left base unchanged. Hazy opacification over the right middle lobe without significant change. Mild stable cardiomegaly. Calcified plaque is present over the aortic arch. There are degenerative changes of the spine. IMPRESSION: Hazy opacification over the right middle lobe unchanged and may be due to  atelectasis or infection. Mild stable cardiomegaly. Aortic atherosclerosis. Electronically Signed   By: Elberta Fortis M.D.   On: 12/21/2015 16:00   Dg Chest 2 View  Result Date: 12/16/2015 CLINICAL DATA:  Chest pain and shortness of Breath EXAM: CHEST  2 VIEW COMPARISON:  11/27/2009 FINDINGS: Cardiac shadow is enlarged. Postsurgical changes are again seen. The lungs are hyperinflated with some mid lung scarring on the left. Increased density in the right lung base is noted projecting in the right middle lobe on the lateral projection. Mild interstitial edema is noted particularly in the right lung base. No sizable effusion is seen. No acute bony abnormality is noted. IMPRESSION: Right middle lobe infiltrate. Mild interstitial edema is noted in the right lung base. Electronically Signed   By: Alcide Clever M.D.   On: 12/16/2015 17:06   Dg Thoracic Spine 2 View  Result Date: 12/17/2015 CLINICAL DATA:  Back pain.  Fall. EXAM: THORACIC SPINE 2 VIEWS COMPARISON:  12/16/2015. FINDINGS: No acute bony abnormality identified. Degenerative changes thoracic spine. Stable minimal upper thoracic vertebral body compressions. Aortic atherosclerotic vascular calcification. Prior CABG. IMPRESSION: 1. Diffuse degenerative changes thoracic spine. Old mild upper thoracic compressions noted. No acute abnormality. 2. Aortic atherosclerotic vascular disease.  Prior CABG. Electronically Signed   By: Maisie Fus  Register   On: 12/17/2015 07:14   Dg Lumbar Spine Complete  Result Date: 12/17/2015 CLINICAL DATA:  Shoulder pain.  Fall. EXAM: LUMBAR SPINE - COMPLETE 4+ VIEW COMPARISON:  Ultrasound 07/30/2015.  MRI 08/16/2013.  CT 12/01/2010. FINDINGS: Diffuse multilevel degenerative change. 4 mm anterolisthesis L4 on L5. Mild L3 vertebral body compression. This is new from prior study of 08/16/2013. Aortic atherosclerotic vascular calcification. Aneurysmal dilatation of the distal abdominal aorta 3 cm cannot be excluded. Abdominal  aortic ultrasound can be obtained for further evaluation . Left nephrolithiasis again noted. IMPRESSION: 1. Mild L3 vertebral body compression.  This is new from prior study of 08/16/2013. 2. Diffuse degenerative change lumbar spine with 4 mm anterolisthesis L4 on L5. 3.Aortic atherosclerotic vascular disease. Aneurysmal dilatation of the distal abdominal aorta to 3 cm cannot be excluded. Abdominal aortic ultrasound can be obtained for further evaluation. Left nephrolithiasis again noted. 4. Left nephrolithiasis again noted. Electronically Signed   By: Maisie Fushomas  Register   On: 12/17/2015 07:21   Dg Pelvis 1-2 Views  Result Date: 12/17/2015 CLINICAL DATA:  Back pain.  Fall. EXAM: PELVIS - 1-2 VIEW COMPARISON:  CT 12/01/2010. FINDINGS: Degenerative changes lumbar spine and both hips. No acute bony abnormality. Vascular calcification. IMPRESSION: Degenerative changes lumbar spine and both hips. No acute bony abnormality. Electronically Signed   By: Maisie Fushomas  Register   On: 12/17/2015 07:19   Ct Chest Wo Contrast  Result Date: 12/22/2015 CLINICAL DATA:  Cough.  Shortness of breath. EXAM: CT CHEST WITHOUT CONTRAST TECHNIQUE: Multidetector CT imaging of the chest was performed following the standard protocol without IV contrast. COMPARISON:  12/21/2015 radiograph and 12/18/2015 chest CT FINDINGS: Cardiovascular: Coronary, aortic arch, and branch vessel atherosclerotic vascular disease. Moderate cardiomegaly. Prior CABG. Prominent main pulmonary artery. Mediastinum/Nodes: Small mediastinal lymph nodes are not pathologically enlarged by size criteria. Lungs/Pleura: Compared 4 days ago, the is a new 4 mm right upper lobe nodule on image 37 of series 4 which is likely inflammatory given that it was not present previously. There is some scattered atelectasis in the right upper lobe along with some notable bilateral airway thickening. There is new complete atelectasis of the right middle lobe with new filling or occlusion  of the right middle lobe bronchus. There is occlusion of segmental bronchi in the right lower lobe with infrahilar atelectasis and bandlike densities. Similar scarring or atelectasis in the left upper lobe compared to the prior exam. Mildly increased atelectasis in the left lower lobe. Upper Abdomen: New mild perihepatic ascites. Musculoskeletal: Unremarkable IMPRESSION: 1. Worsening bilateral airway thickening, with new complete occlusion of the right middle lobe bronchus and complete atelectasis of the right middle lobe, and segmental bronchi inclusion in the right lower lobe with increasing right infrahilar atelectasis. There is also mildly increased left lower lobe atelectasis. 2. New mild perihepatic ascites, cause uncertain. 3. Coronary, aortic arch, and branch vessel atherosclerotic vascular disease. Moderate cardiomegaly. Prominent main pulmonary artery suggesting pulmonary arterial hypertension. Prior CABG. Electronically Signed   By: Gaylyn RongWalter  Liebkemann M.D.   On: 12/22/2015 19:14   Ct Chest Wo Contrast  Result Date: 12/19/2015 CLINICAL DATA:  Acute onset of respiratory failure. Hypoxia. Atrial fibrillation. Initial encounter. EXAM: CT CHEST WITHOUT CONTRAST TECHNIQUE: Multidetector CT imaging of the chest was performed following the standard protocol without IV contrast. COMPARISON:  Chest radiograph performed 12/16/2015 FINDINGS: Cardiovascular: The heart is mildly enlarged. Diffuse coronary artery calcifications are seen. Calcification is noted at the mitral valve. Scattered calcification is noted along the thoracic aorta. The great vessels are grossly unremarkable. Mediastinum/Nodes: The mediastinum is otherwise unremarkable. No mediastinal lymphadenopathy is seen. No pericardial effusion is identified. The thyroid gland is grossly unremarkable. No axillary lymphadenopathy is seen. The patient is status post median sternotomy. Lungs/Pleura: Nodular interstitial opacities are noted at the lung  bases. This may reflect scarring, atypical infection, or mild interstitial lung disease. No pleural effusion or pneumothorax is seen. Upper Abdomen: The visualized portions of the liver and spleen are grossly unremarkable. The visualized portions of the pancreas and adrenal glands are grossly unremarkable. There is mild left renal atrophy. Nonobstructing bilateral  renal stones are seen, measuring up to 3 mm in size. Musculoskeletal: No acute osseous abnormalities are identified. The visualized musculature is unremarkable in appearance. IMPRESSION: 1. Nodular interstitial opacities at the lung bases. This may reflect scarring, atypical infection or mild interstitial lung disease. 2. Mild cardiomegaly. Diffuse coronary artery calcifications seen. Calcification at the mitral valve. 3. Mild left renal atrophy. Nonobstructing bilateral renal stones, measuring up to 3 mm in size. Electronically Signed   By: Roanna RaiderJeffery  Chang M.D.   On: 12/19/2015 00:49   Dg Esophagus  Addendum Date: 12/23/2015   ADDENDUM REPORT: 12/23/2015 13:51 ADDENDUM: These results were called by telephone at the time of interpretation on 12/23/2015 at 11:51 am to Dr. Albertine GratesFANG XU , who verbally acknowledged these results. Electronically Signed   By: Charlett NoseKevin  Dover M.D.   On: 12/23/2015 13:51   Result Date: 12/23/2015 CLINICAL DATA:  Difficulty swallowing, possible aspiration. EXAM: ESOPHOGRAM/BARIUM SWALLOW TECHNIQUE: Single contrast examination was performed using  thin barium. FLUOROSCOPY TIME:  Fluoroscopy Time:  1 minutes 12 seconds Radiation Exposure Index (if provided by the fluoroscopic device): 8.5 mGy Number of Acquired Spot Images: 1 COMPARISON:  CT 12/22/2015 FINDINGS: There is near complete obstruction of the distal esophagus with irregular filling defects. A small amount of contrast finally passes through the distal esophagus. Findings concerning for food impaction. Full column stasis of contrast above the distal obstruction with very slow  emptying of the esophagus. IMPRESSION: Near complete obstruction of the distal esophagus with filling defects and a very slow trickle of contrast through the distal esophagus into the stomach. Findings concerning for fecal impaction. Cannot exclude partially obstructing underlying distal mass. Recommend further evaluation with endoscopy. Electronically Signed: By: Charlett NoseKevin  Dover M.D. On: 12/23/2015 12:20   Dg Chest Port 1 View  Result Date: 12/24/2015 CLINICAL DATA:  Hypoxia. EXAM: PORTABLE CHEST 1 VIEW COMPARISON:  Radiographs of December 21, 2015. FINDINGS: Stable cardiomediastinal silhouette. Status post coronary artery bypass graft. Atherosclerosis of thoracic aorta is noted. No pneumothorax or pleural effusion is noted. No acute pulmonary disease is noted. Bony thorax is unremarkable. IMPRESSION: No acute cardiopulmonary abnormality seen. Electronically Signed   By: Lupita RaiderJames  Green Jr, M.D.   On: 12/24/2015 10:19   Dg Shoulder Left  Result Date: 12/16/2015 CLINICAL DATA:  Left shoulder pain following fall 2 days ago, initial encounter EXAM: LEFT SHOULDER - 2+ VIEW COMPARISON:  None. FINDINGS: There is no evidence of fracture or dislocation. There is no evidence of arthropathy or other focal bone abnormality. Soft tissues are unremarkable. Old healed rib fractures are noted. IMPRESSION: No acute abnormality noted. Electronically Signed   By: Alcide CleverMark  Lukens M.D.   On: 12/16/2015 17:06   Ct Maxillofacial Wo Contrast  Result Date: 12/19/2015 CLINICAL DATA:  70 year old female with recent fall. EXAM: CT MAXILLOFACIAL WITHOUT CONTRAST TECHNIQUE: Multidetector CT imaging of the maxillofacial structures was performed. Multiplanar CT image reconstructions were also generated. A small metallic BB was placed on the right temple in order to reliably differentiate right from left. COMPARISON:  None. FINDINGS: Osseous: No acute fracture or dislocation. Chronic degenerative changes of the temporomandibular joints. The  bones are osteopenic. Orbits: Negative. No traumatic or inflammatory finding. Sinuses: Clear. Soft tissues: Negative. Limited intracranial: No significant or unexpected finding. IMPRESSION: No acute facial bone fractures. Electronically Signed   By: Elgie CollardArash  Radparvar M.D.   On: 12/19/2015 00:58  EGD 12/24/15: Impression:               - Bleeding esophageal ulcers.  Cells for cytology                            obtained. brushing sent for fungal smear. Suspect                            this is reflux induced ulceration.                           - Normal stomach.                           - Normal examined duodenum.  Recommendation:           - Patient has a contact number available for                            emergencies. The signs and symptoms of potential                            delayed complications were discussed with the                            patient. Return to normal activities tomorrow.                            Written discharge instructions were provided to the                            patient.                           - Clear liquid diet.                           - Use sucralfate suspension 1 gram PO QID.                           - Repeat upper endoscopy in 2 months to check                            healing. the patient tolerated this procedure well                            but repeat procedure will need to utilize propofol                            sedation.                           - Resume heparin at prior dose today.   2-D echo 12/17/15: Study Conclusions  - Left ventricle: The cavity size was moderately dilated. Systolic   function was normal. The estimated ejection fraction was in the   range of 55% to 60%. Wall motion was normal; there were no   regional wall motion abnormalities. There was a reduced   contribution of atrial contraction to  ventricular filling, due to   increased ventricular diastolic pressure or atrial contractile   dysfunction.  Doppler parameters are consistent with a reversible   restrictive pattern, indicative of decreased left ventricular   diastolic compliance and/or increased left atrial pressure (grade   3 diastolic dysfunction). Doppler parameters are consistent with   high ventricular filling pressure. - Mitral valve: Calcified annulus. The findings are consistent with   mild stenosis. There was mild to moderate regurgitation directed   eccentrically and posteriorly. Mean gradient (D): 3 mm Hg. Valve   area by pressure half-time: 1.42 cm^2. Valve area by continuity   equation (using LVOT flow): 1.6 cm^2. - Left atrium: The atrium was severely dilated. - Pulmonic valve: There was trivial regurgitation. - Pulmonary arteries: PA peak pressure: 57 mm Hg (S).  Impressions:  - The right ventricular systolic pressure was increased consistent   with moderate pulmonary hypertension.   Subjective: Seen this morning. Denied complaints. Eager to return home rather than considering SNF. No chest pain, dyspnea, cough, dizziness or lightheadedness reported. As per RN, no acute issues.  Discharge Exam:  Vitals:   12/27/15 2004 12/28/15 0356 12/28/15 0720 12/28/15 1451  BP: (!) 113/46 (!) 130/49  (!) 98/38  Pulse: (!) 59 60  60  Resp: 18 18  16   Temp: 98.6 F (37 C) 98 F (36.7 C)  97.5 F (36.4 C)  TempSrc: Oral Oral  Oral  SpO2: 97% 96% 96% 96%  Weight:  76.7 kg (169 lb 3.2 oz)    Height:        General: Pt lying comfortably in bed & appears in no obvious distress. Cardiovascular: S1 & S2 heard, RRR, S1/S2 +. No murmurs, rubs, gallops or clicks. No JVD or pedal edema.Telemetry: On demand atrial paced rhythm or sinus rhythm in the 60s.  Respiratory:Slightly diminished breath sounds in the bases without wheezing, rhonchi or crackles. No increased work of breathing. Abdominal:  Non distended, non tender & soft. No organomegaly or masses appreciated. Normal bowel sounds heard. CNS: Alert and oriented. No  focal deficits. Extremities: no edema, no cyanosis Psychiatry: Pleasant and appropriate affect.    The results of significant diagnostics from this hospitalization (including imaging, microbiology, ancillary and laboratory) are listed below for reference.     Microbiology: Recent Results (from the past 240 hour(s))  Urine culture     Status: Abnormal   Collection Time: 12/23/15 10:59 AM  Result Value Ref Range Status   Specimen Description URINE, CLEAN CATCH  Final   Special Requests NONE  Final   Culture MULTIPLE SPECIES PRESENT, SUGGEST RECOLLECTION (A)  Final   Report Status 12/25/2015 FINAL  Final  Surgical pcr screen     Status: None   Collection Time: 12/26/15 11:36 AM  Result Value Ref Range Status   MRSA, PCR NEGATIVE NEGATIVE Final   Staphylococcus aureus NEGATIVE NEGATIVE Final    Comment:        The Xpert SA Assay (FDA approved for NASAL specimens in patients over 49 years of age), is one component of a comprehensive surveillance program.  Test performance has been validated by [facility] for patients greater than or equal to 55 year old. It is not intended to diagnose infection nor to guide or monitor treatment.      Labs: BNP (last 3 results)  Recent Labs  12/16/15 1544  BNP 751.9*   Basic Metabolic Panel:  Recent Labs Lab 12/23/15 0601 12/24/15 0326 12/25/15 0530 12/26/15 0524 12/26/15 1643 12/27/15 0152  12/28/15 0339  NA 139 138 138 141  --  138 137  K 4.5 4.4 4.1 4.3  --  4.3 4.7  CL 103 105 106 107  --  107 105  CO2 30 26 28 30   --  27 28  GLUCOSE 134* 119* 91 109*  --  166* 127*  BUN 21* 17 16 16   --  16 13  CREATININE 1.21* 0.89 0.91 1.03* 1.02* 0.99 0.87  CALCIUM 9.0 9.2 9.2 9.4  --  9.2 9.4  MG 1.6* 2.3 2.1  --   --   --  1.7   Liver Function Tests:  Recent Labs Lab 12/22/15 0531 12/23/15 0601 12/24/15 0326 12/28/15 0339  AST 31 32 32 46*  ALT 10* 11* 12* 15  ALKPHOS 83 80 89 88  BILITOT 1.7* 2.0* 1.7* 1.4*  PROT  5.6* 5.7* 5.9* 4.5*  ALBUMIN 3.1* 3.2* 3.4* 2.4*   No results for input(s): LIPASE, AMYLASE in the last 168 hours. No results for input(s): AMMONIA in the last 168 hours. CBC:  Recent Labs Lab 12/25/15 0530 12/26/15 0524 12/26/15 1643 12/27/15 0152 12/28/15 0339  WBC 9.2 8.4 6.2 6.4 7.4  HGB 10.6* 10.6* 10.2* 9.8* 9.9*  HCT 32.3* 33.8* 32.0* 30.8* 30.8*  MCV 97.6 99.4 97.3 97.5 96.3  PLT 110* 121* 90* 88* 87*   Cardiac Enzymes:  Recent Labs Lab 12/21/15 1755 12/22/15 0005  TROPONINI <0.03 <0.03   BNP: Invalid input(s): POCBNP CBG:  Recent Labs Lab 12/28/15 0806  GLUCAP 137*   Urinalysis    Component Value Date/Time   COLORURINE AMBER (A) 12/23/2015 1059   APPEARANCEUR CLOUDY (A) 12/23/2015 1059   LABSPEC 1.017 12/23/2015 1059   PHURINE 6.0 12/23/2015 1059   GLUCOSEU NEGATIVE 12/23/2015 1059   HGBUR LARGE (A) 12/23/2015 1059   BILIRUBINUR NEGATIVE 12/23/2015 1059   KETONESUR NEGATIVE 12/23/2015 1059   PROTEINUR NEGATIVE 12/23/2015 1059   UROBILINOGEN 1.0 12/02/2010 1023   NITRITE NEGATIVE 12/23/2015 1059   LEUKOCYTESUR SMALL (A) 12/23/2015 1059   Anemia panel: Iron 35, TIBC 329, saturation 11, ferritin 39, folate 10.3 and B12: 1167 TSH: 1.147   Time coordinating discharge: Over 30 minutes  SIGNED:  Marcellus Scott, MD, FACP, FHM. [facility] Pager [phone]  If 7PM-7AM, please contact night-coverage [email or web address] Password [facility] 12/28/2015, 3:32 PM

## 2015-12-28 NOTE — Progress Notes (Signed)
Physical Therapy Treatment Patient Details Name: Gabrielle PalauVirginia W [name] MRN: [id number] DOB: 08/04/1945 Today's Date: 12/28/2015    History of Present Illness Pt is a 70 y.o. female with PMH of CAD status post CABG, paroxysmal atrial fibrillation, chronic systolic heart failure, COPD, hypertension, hyperlipidemia admitted for acute respiratory failure with hypoxia. During current hospitalization, pt taken to the ICU for bradycardia, hypotension and right middle lobe collapse.     PT Comments    Pt continues to decline SNF. Pt planning to return home with sister who is available 24 hours. Pt was able to demonstrate safe mobility with RW and close guard for safety. Pt completed stair training and negotiated 5 stairs with min guard assist. MD notified. Pt will require HHPT, HHOT, HHRN upon d/c.   Follow Up Recommendations  Home health PT;Supervision/Assistance - 24 hour     Equipment Recommendations  None recommended by PT    Recommendations for Other Services       Precautions / Restrictions Precautions Precautions: Fall Precaution Booklet Issued: No Precaution Comments: log rolling for safety secondary to acute L3 and hx of thoracic vertebral compression fxs Restrictions Weight Bearing Restrictions: Yes Other Position/Activity Restrictions: Pacemaker recently    Mobility  Bed Mobility Overal bed mobility: Needs Assistance Bed Mobility: Sit to Supine     Supine to sit: Min guard     General bed mobility comments: Close guard but pt able to complete on her own.   Transfers Overall transfer level: Needs assistance Equipment used: Rolling walker (2 wheeled) Transfers: Sit to/from Stand Sit to Stand: Min guard         General transfer comment: close guard for safety.   Ambulation/Gait Ambulation/Gait assistance: Min guard Ambulation Distance (Feet): 200 Feet Assistive device: 1 person hand held assist;Rolling walker (2 wheeled) Gait Pattern/deviations: Step-through  pattern;Decreased stride length;Trunk flexed Gait velocity: decreased Gait velocity interpretation: Below normal speed for age/gender General Gait Details: With RW pt able to ambulate slow but steady.    Stairs Stairs: Yes Stairs assistance: Min guard Stair Management: One rail Right;Step to pattern;Forwards Number of Stairs: 5 General stair comments: VC's for general safety  Wheelchair Mobility    Modified Rankin (Stroke Patients Only)       Balance Overall balance assessment: Needs assistance Sitting-balance support: Feet supported;No upper extremity supported Sitting balance-Leahy Scale: Good     Standing balance support: No upper extremity supported Standing balance-Leahy Scale: Fair Standing balance comment: Statically                    Cognition Arousal/Alertness: Awake/alert Behavior During Therapy: WFL for tasks assessed/performed Overall Cognitive Status: Within Functional Limits for tasks assessed                      Exercises      General Comments        Pertinent Vitals/Pain Pain Assessment: No/denies pain    Home Living                      Prior Function            PT Goals (current goals can now be found in the care plan section) Acute Rehab PT Goals Patient Stated Goal: to get home with her family PT Goal Formulation: With patient Time For Goal Achievement: 12/31/15 Potential to Achieve Goals: Fair (lacks safety awareness and reports non-compliance with PT) Progress towards PT goals: Progressing toward goals    Frequency  Min 3X/week      PT Plan Current plan remains appropriate    Co-evaluation             End of Session Equipment Utilized During Treatment: Gait belt Activity Tolerance: Patient tolerated treatment well Patient left: in bed;with call bell/phone within reach;with bed alarm set;with family/visitor present     Time: [id number] PT Time Calculation (min) (ACUTE ONLY): 27  min  Charges:  $Gait Training: 8-22 mins $Therapeutic Activity: 8-22 mins                    G Codes:      Conni SlipperKirkman, Dalyla Chui 12/28/2015, 1:58 PM   Conni SlipperLaura Alyxandra Tenbrink, PT, DPT Acute Rehabilitation Services Pager: [phone]

## 2015-12-28 NOTE — Discharge Instructions (Signed)
° ° °  Supplemental Discharge Instructions for  Pacemaker/Defibrillator Patients  Activity No heavy lifting or vigorous activity with your left/right arm for 6 to 8 weeks.  Do not raise your left/right arm above your head for one week.  Gradually raise your affected arm as drawn below.          12/31/15                      01/01/16                    01/02/16                  01/03/16 __  NO DRIVING for 1 week    ; you may begin driving on  16/03/909/3/17   .  WOUND CARE - Keep the wound area clean and dry.  Do not get this area wet for one week. No showers for one week; you may shower on 01/03/16    . - The tape/steri-strips on your wound will fall off; do not pull them off.  No bandage is needed on the site.  DO  NOT apply any creams, oils, or ointments to the wound area. - If you notice any drainage or discharge from the wound, any swelling or bruising at the site, or you develop a fever > 101? F after you are discharged home, call the office at once.  Special Instructions - You are still able to use cellular telephones; use the ear opposite the side where you have your pacemaker/defibrillator.  Avoid carrying your cellular phone near your device. - When traveling through airports, show security personnel your identification card to avoid being screened in the metal detectors.  Ask the security personnel to use the hand wand. - Avoid arc welding equipment, MRI testing (magnetic resonance imaging), TENS units (transcutaneous nerve stimulators).  Call the office for questions about other devices. - Avoid electrical appliances that are in poor condition or are not properly grounded. - Microwave ovens are safe to be near or to operate.  Additional information for defibrillator patients should your device go off: - If your device goes off ONCE and you feel fine afterward, notify the device clinic nurses. - If your device goes off ONCE and you do not feel well afterward, call 911. - If your device goes  off TWICE, call 911. - If your device goes off THREE times in one day, call 911.  DO NOT DRIVE YOURSELF OR A FAMILY MEMBER WITH A DEFIBRILLATOR TO THE HOSPITAL--CALL 911.

## 2015-12-29 NOTE — Progress Notes (Signed)
Pt/family given discharge instructions, medication lists, follow up appointments, and when to call the doctor.  Pt/family verbalizes understanding. Twin sister in hurry for discharge but understood need to thoroughly understand instructions. Pt given signs and symptoms of infection. Patient transported to main entrance for discharge with sister. Thomas HoffBurton, Falisa Lamora McClintock, RN

## 2015-12-30 NOTE — Telephone Encounter (Signed)
Spoke with pt and she states that Eliquis was stopped previously due to some bleeding issues. Pt states Sotalol was stopped at recent hospitalization.  Pt denies any bleeding issues at this time.  Pt would like to know when Dr. Anne FuSkains would like for her to start these medications back.  Advised pt since she still has ulcers we may have to hold off. Pt states that she would like Dr. Anne FuSkains recommendations on this situation.  Will route to him for review and advisement on Eliquis and Sotalol.

## 2015-12-30 NOTE — Telephone Encounter (Signed)
Left message to call back  

## 2015-12-30 NOTE — Telephone Encounter (Signed)
Continue to hold both medications. Donato SchultzMark Melida Northington, MD

## 2015-12-30 NOTE — Telephone Encounter (Signed)
Follow Up   [name] is calling to find out when dop she start back taking her eliquis . Please call

## 2015-12-31 NOTE — Telephone Encounter (Signed)
Left message to call back  

## 2016-01-01 NOTE — Telephone Encounter (Signed)
Spoke with pt who is aware to continue to hold these medications as instructed for now.  She denies any further bleeding issues.  appt dates and time were reviewed and she will keep appts as scheduled.

## 2016-01-02 ENCOUNTER — Ambulatory Visit (INDEPENDENT_AMBULATORY_CARE_PROVIDER_SITE_OTHER): Payer: Medicare Other | Admitting: *Deleted

## 2016-01-02 DIAGNOSIS — I495 Sick sinus syndrome: Secondary | ICD-10-CM

## 2016-01-02 NOTE — Progress Notes (Signed)
Wound check appointment. Steri-strips removed. Wound without redness or edema. Incision edges approximated, wound well healed. Normal device function. Thresholds, sensing, and impedances consistent with implant measurements. Device programmed at 3.5V programmed on for extra safety margin until 3 month visit. Histogram distribution appropriate for patient and level of activity. No mode switches or high ventricular rates noted. Patient educated about wound care, arm mobility, lifting restrictions. ROV in 03/31/2016 with GT.

## 2016-01-16 ENCOUNTER — Encounter: Payer: Self-pay | Admitting: Cardiology

## 2016-01-16 ENCOUNTER — Encounter (INDEPENDENT_AMBULATORY_CARE_PROVIDER_SITE_OTHER): Payer: Self-pay

## 2016-01-16 ENCOUNTER — Ambulatory Visit (INDEPENDENT_AMBULATORY_CARE_PROVIDER_SITE_OTHER): Payer: Medicare Other | Admitting: Cardiology

## 2016-01-16 VITALS — BP 136/84 | HR 66 | Ht 62.5 in | Wt 170.4 lb

## 2016-01-16 DIAGNOSIS — I480 Paroxysmal atrial fibrillation: Secondary | ICD-10-CM | POA: Diagnosis not present

## 2016-01-16 DIAGNOSIS — K922 Gastrointestinal hemorrhage, unspecified: Secondary | ICD-10-CM | POA: Diagnosis not present

## 2016-01-16 DIAGNOSIS — Z950 Presence of cardiac pacemaker: Secondary | ICD-10-CM | POA: Diagnosis not present

## 2016-01-16 DIAGNOSIS — I495 Sick sinus syndrome: Secondary | ICD-10-CM | POA: Diagnosis not present

## 2016-01-16 MED ORDER — METOPROLOL SUCCINATE ER 25 MG PO TB24: 25.0000 mg | ORAL_TABLET | Freq: Every day | ORAL | 3 refills | Status: DC

## 2016-01-16 NOTE — Patient Instructions (Addendum)
Medication Instructions:  The current medical regimen is effective;  continue present plan and medications.  Follow-Up: Follow up in 3 months with Dr Skains.  If you need a refill on your cardiac medications before your next appointment, please call your pharmacy.  Thank you for choosing [facility]!!     

## 2016-01-16 NOTE — Progress Notes (Signed)
[address], [address] [place], [place]  [address] Phone: [phone] Fax:  [phone]  Date:  01/16/2016   ID:  Gabrielle [name], Gabrielle [name] 1945-12-27, MRN [id number]  PCP:  Gabrielle Copas, MD   History of Present Illness: Gabrielle [name] is a 70 y.o. female  with coronary artery disease status post bypass surgery in 2010, paroxysmal atrial fibrillation, tachycardia-bradycardia syndrome with pacemaker placement, EF 45-50%. COPD, obesity who unfortunately was hospitalized multiple times in 2017 with significant lower GI bleed, bright red blood, hematochezia with hemoglobin of 4 requiring a total of 6 units of packed red blood cells, 4 during initial admission and 2 during a repeat admission after she had hypotension and more blood loss. GI, Dr. Ewing [name], [facility] has seen. Friable mucosa noted on colonoscopy. Presumed diverticular bleed. She has been off of her Eliquis since then. Please see below for full details.  She had prolonged QT as well, stopped Celexa and sotalol.  Even last night, she does see spotting at times, bright red blood which she attributes to her hemorrhoid.  Gabrielle [name] twin.  Trying to avoid cigarettes.   Wt Readings from Last 3 Encounters:  01/16/16 170 lb 6.4 oz (77.3 kg)  12/28/15 169 lb 3.2 oz (76.7 kg)  11/12/15 200 lb 6.4 oz (90.9 kg)     Past Medical History:  Diagnosis Date  . Anemia   . Arthritis   . Asthma   . Atrial fibrillation with controlled ventricular response (HCC) 11/2009   a. not on anticoagulation due to recurrent GI bleeding while on Eliquis.  . Chronic bronchitis   . Chronic diastolic CHF (congestive heart failure) (HCC)    a. Echo 12/2015: EF 55-60% w/ Grade 3 DD  . Chronic headache   . COPD (chronic obstructive pulmonary disease) (HCC)   . Depression   . Heart attack   . Hyperlipidemia   . Hypertension     Past Surgical History:  Procedure Laterality Date  . CAD-CABG     x7, brief post-op Atrial Fib  . CESAREAN  SECTION     x2  . COLONOSCOPY WITH PROPOFOL N/A 10/24/2015   Procedure: COLONOSCOPY WITH PROPOFOL;  Surgeon: Gabrielle Ching, MD;  Location: [facility] ENDOSCOPY;  Service: Endoscopy;  Laterality: N/A;  . CORONARY ARTERY BYPASS GRAFT  2011  . EP IMPLANTABLE DEVICE N/A 12/26/2015   Procedure: Pacemaker Implant;  Surgeon: Gabrielle Maw, MD;  Location: [facility] INVASIVE CV LAB;  Service: Cardiovascular;  Laterality: N/A;  . ESOPHAGOGASTRODUODENOSCOPY N/A 10/22/2015   Procedure: ESOPHAGOGASTRODUODENOSCOPY (EGD);  Surgeon: Gabrielle Ching, MD;  Location: [facility] ENDOSCOPY;  Service: Endoscopy;  Laterality: N/A;  . ESOPHAGOGASTRODUODENOSCOPY N/A 12/24/2015   Procedure: ESOPHAGOGASTRODUODENOSCOPY (EGD);  Surgeon: Gabrielle Ching, MD;  Location: [facility] ENDOSCOPY;  Service: Endoscopy;  Laterality: N/A;  . JOINT REPLACEMENT Bilateral   . TONSILLECTOMY    . TOTAL KNEE ARTHROPLASTY      Current Outpatient Prescriptions  Medication Sig Dispense Refill  . albuterol (PROVENTIL HFA;VENTOLIN HFA) 108 (90 Base) MCG/ACT inhaler Inhale 2 puffs into the lungs every 6 (six) hours as needed for wheezing or shortness of breath.    . ALPRAZolam (XANAX) 0.5 MG tablet Take 1 tablet (0.5 mg total) by mouth 2 (two) times daily as needed for anxiety. 10 tablet 0  . atorvastatin (LIPITOR) 20 MG tablet Take 20 mg by mouth daily.    . budesonide-formoterol (SYMBICORT) 160-4.5 MCG/ACT inhaler Inhale 2 puffs into the lungs 2 (two) times daily. 10.2 g  6  . clobetasol cream (TEMOVATE) 0.05 % Apply 1 application topically daily as needed (itching).   3  . docusate sodium (COLACE) 100 MG capsule Take 100 mg by mouth daily as needed for moderate constipation.     . fluticasone (FLONASE) 50 MCG/ACT nasal spray Place 2 sprays into both nostrils daily. 16 g 0  . furosemide (LASIX) 20 MG tablet Take 20 mg by mouth daily. Patient may take on additional one as needed for swelling    . guaiFENesin (MUCINEX) 600 MG 12 hr tablet Take 1 tablet (600 mg total) by mouth 2  (two) times daily. 30 tablet 0  . losartan (COZAAR) 50 MG tablet Take 0.5 tablets (25 mg total) by mouth daily. 30 tablet 0  . metoprolol succinate (TOPROL-XL) 25 MG 24 hr tablet Take 1 tablet (25 mg total) by mouth daily. 90 tablet 3  . oxymetazoline (AFRIN) 0.05 % nasal spray Place 2 sprays into both nostrils 2 (two) times daily as needed for congestion.     . pantoprazole (PROTONIX) 40 MG tablet Take 40 mg by mouth daily before breakfast.     . Polyethyl Glycol-Propyl Glycol (SYSTANE ULTRA) 0.4-0.3 % SOLN Place 1 drop into both eyes daily as needed (for dry eyes).     . polyethylene glycol (MIRALAX / GLYCOLAX) packet Take 17 g by mouth daily as needed for mild constipation. 14 each 0  . ranitidine (ZANTAC) 150 MG tablet Take 150 mg by mouth 2 (two) times daily.    . sucralfate (CARAFATE) 1 GM/10ML suspension Take 10 mLs (1 g total) by mouth 4 (four) times daily -  with meals and at bedtime. 420 mL 2  . tiZANidine (ZANAFLEX) 4 MG tablet Take 4 mg by mouth at bedtime as needed for muscle spasms.    . traMADol (ULTRAM) 50 MG tablet Take 1 tablet (50 mg total) by mouth every 6 (six) hours as needed for moderate pain. 15 tablet 0   No current facility-administered medications for this visit.     Allergies:    Allergies  Allergen Reactions  . Novocain [Procaine] Hives and Other (See Comments)    palpitations  . Penicillins Hives    Has patient had a PCN reaction causing immediate rash, facial/tongue/throat swelling, SOB or lightheadedness with hypotension: Yes Has patient had a PCN reaction causing severe rash involving mucus membranes or skin necrosis: No Has patient had a PCN reaction that required hospitalization No Has patient had a PCN reaction occurring within the last 10 years: No If all of the above answers are "NO", then may proceed with Cephalosporin use.    Social History:  The patient  reports that she has quit smoking. Her smoking use included Cigarettes. She smoked 0.20 packs  per day. She has never used smokeless tobacco. She reports that she does not drink alcohol or use drugs.   Family History  Problem Relation Age of Onset  . Diabetes Mother   . Hypertension Mother   . Heart disease Mother     before age 72  . Heart disease Father     before age 72  . Hypertension Father   . Heart attack Father   . Lung cancer Maternal Grandfather   . Lung cancer Paternal Grandfather   . Hypertension Daughter   . Stroke Paternal Grandmother     ROS:  Please see the history of present illness.   Denies any bleeding, syncope, orthopnea, PND   All other systems reviewed and negative.  PHYSICAL EXAM: VS:  BP 136/84   Pulse 66   Ht 5' 2.5" (1.588 m)   Wt 170 lb 6.4 oz (77.3 kg)   LMP  (LMP Unknown)   BMI 30.67 kg/m  Well nourished, well developed, in no acute distress In wheelchair. HEENT: normal, [place]/AT, EOMI Neck: no JVD, normal carotid upstroke, no bruit Cardiac:  normal S1, S2; RRR; no murmur  Lungs:  clear to auscultation bilaterally, no wheezing, rhonchi or rales  Abd: soft, nontender, no hepatomegaly, no bruits  Ext: 1+ edema, 2+ distal pulses Skin: warm and dry, silvery scale plaque throughout several portions of her body.  GU: deferred Neuro: no focal abnormalities noted, AAO x 3  EKG:  No EKG today-Prior 01/07/15-sinus bradycardia rate 54 with frequent PVCs, low amplitude P wave, poor R-wave progression, T-wave inversion in 1 and aVL. 07/04/14-sinus rhythm, 72, nonspecific ST-T wave changes -prior 02/07/14-sinus rhythm, 61, inferior infarct pattern, nonspecific ST-T wave changes. Poor R-wave progression.   Labs: Hospital labs reviewed. ASSESSMENT AND PLAN:  1. Paroxysmal atrial fibrillation-now off sotalol. Pacer placed due to bradycardia. Off anticoagulation because of multiple GI bleeds. EF 60%. Needs some time to heal esophageal ulcers. She is going to go back to Dr. Randa EvensEdwards for his opinion on when we can restart anticoagulation. Prolonged QT - off celexa  and sotalol, was 602ms.  was on apixaban but had presumed diverticular bleeding admitted 10/21/15 with Hg of 4, BRBPR s/p 4 units PRBC with repeat diverticular bleeding 10/27/15 after DC in SNF with 2 unit PRBC transfusion (Hg was 4 on original admit).  Colonoscopy diffusely friable with contact bleeding. . She had recurrent hematochezia at SNF after 2 days of no bleeding. EMS - hypotensive.  We will continue with metoprolol only for rate control. She is doing a good job with this. 2. Chronic anticoagulation-as above. Stopped. We discussed at length risks versus benefits and at this time given continued occasional hematochezia, severe anemia requiring 3 hospitalizations I do not wish to resume anticoagulation at this time however I would like for her to see Dr. Randa EvensEdwards again to give us the go ahead/timing on when to resume Eliquis.  She understands the risk of stroke. 3. Chronic kidney disease stage III-she is seeing Dr. Lowell GuitarPowell. She did have recent exacerbation of kidney function. On methotrexate for psoriasis. 4. Coronary artery disease-status post CABG. Overall no anginal symptoms. Try to encourage her to take statin.  Lasix because of shortness of breath. 5. Weight loss-discuss with primary. Has been quite dramatic. 6. Depression-improved.  7. Hyperlipidemia-atorvastatin  8. Bradycardia -resolved with pacemaker 9. Smoker - encourage continued cessation.  10. 3 month follow up    Signed, Donato SchultzMark Amberli Ruegg, MD [facility]  01/16/2016 11:37 AM

## 2016-02-03 ENCOUNTER — Telehealth: Payer: Self-pay | Admitting: Cardiology

## 2016-02-03 NOTE — Telephone Encounter (Signed)
New message   Took off of anti depressant meds  Pt is needing anti depressant/crys all the time  Also asking if Dr. Randa EvensEdwards if pt can be put back on blood thinner  Please call back and advise

## 2016-02-03 NOTE — Telephone Encounter (Signed)
Spoke with patient who states she called to ask about taking anti-depressants and anti-coagulants.  She states she had bleeding problem after endoscopy. She saw Dr. Randa EvensEdwards, her GI doctor last week.  States Dr. Anne FuSkains asked her to have Dr. Randa EvensEdwards call him after her office visit to discuss anti-coagulant therapy. She states Dr. Anne FuSkains stopped her anti-depressants (Celexa) in the past due to potential problems with her heart.  She would like to him to give her permission to restart. She states she really needs the anti-depressant and wants Dr. Caro HightSkain to approve it for her.  I advised that I will forward message to Dr. Anne FuSkains for advice and someone will call her back tomorrow with his advice.

## 2016-02-04 NOTE — Telephone Encounter (Signed)
Spoke with Ms. Gabrielle [name].  Informed that per Dr. Anne FuSkains it is ok for her to restart antidepressant since she is off sotolol.   Advised her to contact PCP to restart and that I would forward Dr. Anne FuSkains note to PCP as well.  Advised patient I would contact Dr. Dahlia ClientEdward's office for last OV note regarding anticoagulation.  Pt states she is bleeding currently.  It started this am from her rectum and she is still having bleeding at this time.  States she received 6 units of blood when her hemoglobin went from 12 to 4 in the past.  She feels "fine".  Denies SOB or fatigue.  Advised patient to contact Dr. Randa EvensEdwards office this AM to inform of ongoing bleeding today.     I did contact Dr. Randa EvensEdwards office and asked for last OV note to be faxed to Dr. Anne FuSkains.

## 2016-02-04 NOTE — Telephone Encounter (Signed)
Gabrielle [name] is returning a call .Marland Kitchen. Thanks

## 2016-02-04 NOTE — Telephone Encounter (Signed)
Ok to restart antidepressant (stopped in hospital, long QT). Now that sotalol is stopped it should be OK to restart (PCP prescribes). Will check ECG in future.   Please obtain notes from Dr. Randa EvensEdwards visit to see what he said about anticoagulation.  Thanks  Donato SchultzMark Skains, MD

## 2016-02-04 NOTE — Telephone Encounter (Signed)
Unable to leave a message, mailbox not set up to take messages.

## 2016-02-04 NOTE — Telephone Encounter (Signed)
Thank you for update. Please have her call Dr. Randa EvensEdwards office regarding rectal bleeding.  Donato SchultzMark Marquin Patino, MD

## 2016-02-05 ENCOUNTER — Telehealth: Payer: Self-pay | Admitting: Cardiology

## 2016-02-05 NOTE — Telephone Encounter (Signed)
New message      Pt want to know if she can have her hgb checked?  She had been bleeding internally.  Please call and let her know

## 2016-02-05 NOTE — Telephone Encounter (Signed)
Spoke with pt who is reporting she had some blood on the toilet tissue yesterday, none today.  She states she thinks she drank too much juice yesterday and it caused her to bleed. Advised that if she believes she is having further GI bleeding she should contact Dr. Randa EvensEdwards.  She had been told to OK to restart her Eliquis as previously Rxed but she has not done this yet.  Advised I will forward to Dr Anne FuSkains for his knowledge.  Hemoglobin 11/28 was 11.9.

## 2016-02-06 NOTE — Telephone Encounter (Signed)
Thanks for update. Thank you for reinforcing the importance of Dr. Randa EvensEdwards being aware of GI bleeding. Please check Hgb 2 weeks after restarting Elquis (either at our office or at Dr. Randa EvensEdwards office (monitoring GI bleeding). Thanks

## 2016-02-06 NOTE — Telephone Encounter (Signed)
Pt aware that if she starts bleeding again to contact Dr Randa EvensEdwards.  She has not restarted Eliquis yet but is aware to have blood work appr 2 weeks after restarting whether it is here or at Dr Randa EvensEdwards' office.

## 2016-02-27 ENCOUNTER — Other Ambulatory Visit: Payer: Self-pay

## 2016-02-27 DIAGNOSIS — I714 Abdominal aortic aneurysm, without rupture, unspecified: Secondary | ICD-10-CM

## 2016-03-16 ENCOUNTER — Other Ambulatory Visit: Payer: Self-pay | Admitting: Cardiology

## 2016-03-31 ENCOUNTER — Encounter: Payer: Medicare Other | Admitting: Internal Medicine

## 2016-04-01 ENCOUNTER — Encounter: Payer: Self-pay | Admitting: Internal Medicine

## 2016-04-17 ENCOUNTER — Telehealth: Payer: Self-pay | Admitting: Internal Medicine

## 2016-04-17 NOTE — Telephone Encounter (Signed)
2nd attempt to reach patient. Voicemail box full.

## 2016-04-17 NOTE — Telephone Encounter (Signed)
New message     Pt states she received a box to monitor for her device but she doesn't know how to hook it up , could someone please call her

## 2016-04-21 ENCOUNTER — Ambulatory Visit: Payer: Medicare Other | Admitting: Cardiology

## 2016-04-22 ENCOUNTER — Encounter: Payer: Self-pay | Admitting: Cardiology

## 2016-04-28 ENCOUNTER — Encounter (INDEPENDENT_AMBULATORY_CARE_PROVIDER_SITE_OTHER): Payer: Self-pay

## 2016-04-28 ENCOUNTER — Ambulatory Visit (INDEPENDENT_AMBULATORY_CARE_PROVIDER_SITE_OTHER): Payer: Medicare Other | Admitting: Cardiology

## 2016-04-28 ENCOUNTER — Encounter: Payer: Self-pay | Admitting: Cardiology

## 2016-04-28 VITALS — BP 125/60 | HR 62 | Ht 62.0 in | Wt 186.0 lb

## 2016-04-28 DIAGNOSIS — I5032 Chronic diastolic (congestive) heart failure: Secondary | ICD-10-CM | POA: Diagnosis not present

## 2016-04-28 DIAGNOSIS — J438 Other emphysema: Secondary | ICD-10-CM

## 2016-04-28 DIAGNOSIS — I495 Sick sinus syndrome: Secondary | ICD-10-CM

## 2016-04-28 DIAGNOSIS — I480 Paroxysmal atrial fibrillation: Secondary | ICD-10-CM

## 2016-04-28 DIAGNOSIS — K922 Gastrointestinal hemorrhage, unspecified: Secondary | ICD-10-CM

## 2016-04-28 DIAGNOSIS — Z950 Presence of cardiac pacemaker: Secondary | ICD-10-CM

## 2016-04-28 MED ORDER — FUROSEMIDE 20 MG PO TABS: 80.0000 mg | ORAL_TABLET | Freq: Every day | ORAL | Status: DC

## 2016-04-28 NOTE — Progress Notes (Signed)
[address] [place], [place]  [address] Phone: [phone] Fax:  [phone]) [phone]  Date:  04/28/2016   ID:  Gabrielle ReveringVirginia W [name], DOB [birth year], MRN [id number]  PCP:  Irving CopasHACKER,ROBERT KELLER, MD   History of Present Illness: Gabrielle PalauVirginia W [name] is a 71 y.o. female  with coronary artery disease status post bypass surgery in 2010, paroxysmal atrial fibrillation, tachycardia-bradycardia syndrome with pacemaker placement, EF 45-50%. COPD, obesity who unfortunately was hospitalized multiple times in 2017 with significant lower GI bleed, bright red blood, hematochezia with hemoglobin of 4 requiring a total of 6 units of packed red blood cells, 4 during initial admission and 2 during a repeat admission after she had hypotension and more blood loss. Dr. Randa EvensEdwards GI. Friable mucosa noted on colonoscopy. Presumed diverticular bleed. She has been off of her Eliquis since then. Please see below for full details.  She had prolonged QT as well, stopped Celexa and sotalol.  Gabrielle HongJudy twin.  Trying to avoid cigarettes.  Fallen 3 times times last week. Right knee. Larey SeatFell out of chair. Hematoma, rubbed it out. No chest pain. Mild shortness of breath. She's gained 18 pounds. Increased edema in her lower extremities.   Wt Readings from Last 3 Encounters:  04/28/16 186 lb (84.4 kg)  01/16/16 170 lb 6.4 oz (77.3 kg)  12/28/15 169 lb 3.2 oz (76.7 kg)     Past Medical History:  Diagnosis Date  . Anemia   . Arthritis   . Asthma   . Atrial fibrillation with controlled ventricular response (HCC) 11/2009   a. not on anticoagulation due to recurrent GI bleeding while on Eliquis.  . Chronic bronchitis   . Chronic diastolic CHF (congestive heart failure) (HCC)    a. Echo 12/2015: EF 55-60% w/ Grade 3 DD  . Chronic headache   . COPD (chronic obstructive pulmonary disease) (HCC)   . Depression   . Heart attack   . Hyperlipidemia   . Hypertension     Past Surgical History:  Procedure Laterality  Date  . CAD-CABG     x7, brief post-op Atrial Fib  . CESAREAN SECTION     x2  . COLONOSCOPY WITH PROPOFOL N/A 10/24/2015   Procedure: COLONOSCOPY WITH PROPOFOL;  Surgeon: Carman ChingJames Edwards, MD;  [facility];  Service: Endoscopy;  Laterality: N/A;  . CORONARY ARTERY BYPASS GRAFT  2011  . EP IMPLANTABLE DEVICE N/A 12/26/2015   Procedure: Pacemaker Implant;  Surgeon: Marinus MawGregg W Taylor, MD;  [facility];  Service: Cardiovascular;  Laterality: N/A;  . ESOPHAGOGASTRODUODENOSCOPY N/A 10/22/2015   Procedure: ESOPHAGOGASTRODUODENOSCOPY (EGD);  Surgeon: Carman ChingJames Edwards, MD;  [facility];  Service: Endoscopy;  Laterality: N/A;  . ESOPHAGOGASTRODUODENOSCOPY N/A 12/24/2015   Procedure: ESOPHAGOGASTRODUODENOSCOPY (EGD);  Surgeon: Carman ChingJames Edwards, MD;  [facility];  Service: Endoscopy;  Laterality: N/A;  . JOINT REPLACEMENT Bilateral   . TONSILLECTOMY    . TOTAL KNEE ARTHROPLASTY      Current Outpatient Prescriptions  Medication Sig Dispense Refill  . furosemide (LASIX) 20 MG tablet Take 4 tablets (80 mg total) by mouth daily. Patient may take on additional one as needed for swelling 30 tablet   . guaiFENesin (MUCINEX) 600 MG 12 hr tablet Take 1 tablet (600 mg total) by mouth 2 (two) times daily. 30 tablet 0  . albuterol (PROVENTIL HFA;VENTOLIN HFA) 108 (90 Base) MCG/ACT inhaler Inhale 2 puffs into the lungs every 6 (six) hours as needed for wheezing or shortness of breath.    .Marland Kitchen  ALPRAZolam (XANAX) 0.5 MG tablet Take 1 tablet (0.5 mg total) by mouth 2 (two) times daily as needed for anxiety. 10 tablet 0  . atorvastatin (LIPITOR) 20 MG tablet Take 20 mg by mouth daily.    . budesonide-formoterol (SYMBICORT) 160-4.5 MCG/ACT inhaler Inhale 2 puffs into the lungs 2 (two) times daily. 10.2 g 6  . clobetasol cream (TEMOVATE) 0.05 % Apply 1 application topically daily as needed (itching).   3  . docusate sodium (COLACE) 100 MG capsule Take 100 mg by mouth daily as needed for  moderate constipation.     . fluticasone (FLONASE) 50 MCG/ACT nasal spray Place 2 sprays into both nostrils daily. 16 g 0  . losartan (COZAAR) 50 MG tablet Take 0.5 tablets (25 mg total) by mouth daily. 30 tablet 0  . metoprolol succinate (TOPROL-XL) 25 MG 24 hr tablet Take 1 tablet (25 mg total) by mouth daily. 90 tablet 3  . oxymetazoline (AFRIN) 0.05 % nasal spray Place 2 sprays into both nostrils 2 (two) times daily as needed for congestion.     . pantoprazole (PROTONIX) 40 MG tablet Take 40 mg by mouth daily before breakfast.     . Polyethyl Glycol-Propyl Glycol (SYSTANE ULTRA) 0.4-0.3 % SOLN Place 1 drop into both eyes daily as needed (for dry eyes).     . polyethylene glycol (MIRALAX / GLYCOLAX) packet Take 17 g by mouth daily as needed for mild constipation. 14 each 0  . ranitidine (ZANTAC) 150 MG tablet Take 150 mg by mouth 2 (two) times daily.    . sucralfate (CARAFATE) 1 GM/10ML suspension Take 10 mLs (1 g total) by mouth 4 (four) times daily -  with meals and at bedtime. 420 mL 2  . tiZANidine (ZANAFLEX) 4 MG tablet Take 4 mg by mouth at bedtime as needed for muscle spasms.    . traMADol (ULTRAM) 50 MG tablet Take 1 tablet (50 mg total) by mouth every 6 (six) hours as needed for moderate pain. 15 tablet 0   No current facility-administered medications for this visit.     Allergies:    Allergies  Allergen Reactions  . Novocain [Procaine] Hives and Other (See Comments)    palpitations  . Penicillins Hives    Has patient had a PCN reaction causing immediate rash, facial/tongue/throat swelling, SOB or lightheadedness with hypotension: Yes Has patient had a PCN reaction causing severe rash involving mucus membranes or skin necrosis: No Has patient had a PCN reaction that required hospitalization No Has patient had a PCN reaction occurring within the last 10 years: No If all of the above answers are "NO", then may proceed with Cephalosporin use.    Social History:  The patient   reports that she has quit smoking. Her smoking use included Cigarettes. She smoked 0.20 packs per day. She has never used smokeless tobacco. She reports that she does not drink alcohol or use drugs.   Family History  Problem Relation Age of Onset  . Diabetes Mother   . Hypertension Mother   . Heart disease Mother     before age 61  . Heart disease Father     before age 41  . Hypertension Father   . Heart attack Father   . Lung cancer Maternal Grandfather   . Lung cancer Paternal Grandfather   . Hypertension Daughter   . Stroke Paternal Grandmother     ROS:  Please see the history of present illness.   Denies any bleeding, syncope, orthopnea, PND  All other systems reviewed and negative.   PHYSICAL EXAM: VS:  BP 125/60   Pulse 62   Ht 5\' 2"  (1.575 m)   Wt 186 lb (84.4 kg)   LMP  (LMP Unknown)   SpO2 92%   BMI 34.02 kg/m  Well nourished, well developed, in no acute distress In wheelchair. HEENT: normal, [place]/AT, EOMI Neck: no JVD, normal carotid upstroke, no bruit Cardiac:  normal S1, S2; RRR; no murmur  Lungs:  clear to auscultation bilaterally, no wheezing, rhonchi or rales  Abd: soft, nontender, no hepatomegaly, no bruits  Ext: 1-2+ edema, 2+ distal pulses Skin: warm and dry, silvery scale plaque throughout several portions of her body.  GU: deferred Neuro: no focal abnormalities noted, AAO x 3  EKG:  No EKG today-Prior 01/07/15-sinus bradycardia rate 54 with frequent PVCs, low amplitude P wave, poor R-wave progression, T-wave inversion in 1 and aVL. 07/04/14-sinus rhythm, 72, nonspecific ST-T wave changes -prior 02/07/14-sinus rhythm, 61, inferior infarct pattern, nonspecific ST-T wave changes. Poor R-wave progression.   Labs: Hospital labs reviewed.  ASSESSMENT AND PLAN:  1. Paroxysmal atrial fibrillation-now off sotalol. Pacer placed due to bradycardia. Off anticoagulation because of multiple GI bleeds. EF 60%. Needs some time to heal esophageal ulcers. Dr. Randa Evens  recommended resuming anticoagulation. I agree. She has not restarted.  was on apixaban but had presumed diverticular bleeding admitted 10/21/15 with Hg of 4, BRBPR s/p 4 units PRBC with repeat diverticular bleeding 10/27/15 after DC in SNF with 2 unit PRBC transfusion (Hg was 4 on original admit).  Colonoscopy diffusely friable with contact bleeding. . She had recurrent hematochezia at SNF after 2 days of no bleeding. EMS - hypotensive.  We will continue with metoprolol only for rate control. She is doing a good job with this. 2. Chronic anticoagulation-as above. OK to resume Eliquis. Discussed with Dr. Randa Evens.  She understands the risk of stroke. Encourage use 3. Chronic kidney disease stage III-she is seeing Dr. Lowell Guitar. She did have recent exacerbation of kidney function. On methotrexate for psoriasis. 4. Coronary artery disease-status post CABG. Overall no anginal symptoms. Try to encourage her to take statin.  Lasix because of shortness of breath. She has once again been retaining fluid, 16-18 pounds. She states that she is taking 40 mg of Lasix at home. It is hard to know exactly what she is taking. We will increase her Lasix to 80 mg in the a.m. Hours. 5. Chronic diastolic heart failure - need to take better care of herself, decreased fluid intake, decrease lemonade. Lasix. Increasing to 80. 6. COPD-per primary team. No changes. 7. Depression-improved.  8. Hyperlipidemia-atorvastatin  9. Bradycardia -resolved with pacemaker 10. Smoker - encourage continued cessation.  11. 2 week follow up to help avoid readmission to the hospital. Could be helpful to check a basic metabolic profile at that time.    Signed, Donato Schultz, MD [facility]  04/28/2016 9:55 AM

## 2016-04-28 NOTE — Patient Instructions (Signed)
Medication Instructions:  Please increase your Furosemide to 80 mg daily. Continue all other medications as listed.  Follow-Up: Follow up with Bary CastillaKaty Thompson, PA in 2 weeks.  If you need a refill on your cardiac medications before your next appointment, please call your pharmacy.  Thank you for choosing [facility]!!

## 2016-05-11 ENCOUNTER — Inpatient Hospital Stay (HOSPITAL_COMMUNITY)
Admission: EM | Admit: 2016-05-11 | Discharge: 2016-05-21 | DRG: 070 | Disposition: A | Discharge status: Skilled Nursing Facility | Payer: Medicare Other | Attending: Internal Medicine | Admitting: Internal Medicine

## 2016-05-11 ENCOUNTER — Encounter (HOSPITAL_COMMUNITY): Payer: Self-pay | Admitting: Emergency Medicine

## 2016-05-11 ENCOUNTER — Ambulatory Visit (INDEPENDENT_AMBULATORY_CARE_PROVIDER_SITE_OTHER): Payer: Medicare Other | Admitting: *Deleted

## 2016-05-11 ENCOUNTER — Emergency Department (HOSPITAL_COMMUNITY): Payer: Medicare Other

## 2016-05-11 ENCOUNTER — Observation Stay (HOSPITAL_COMMUNITY): Payer: Medicare Other

## 2016-05-11 DIAGNOSIS — R4182 Altered mental status, unspecified: Secondary | ICD-10-CM

## 2016-05-11 DIAGNOSIS — Z8711 Personal history of peptic ulcer disease: Secondary | ICD-10-CM

## 2016-05-11 DIAGNOSIS — D696 Thrombocytopenia, unspecified: Secondary | ICD-10-CM | POA: Diagnosis not present

## 2016-05-11 DIAGNOSIS — Z7951 Long term (current) use of inhaled steroids: Secondary | ICD-10-CM

## 2016-05-11 DIAGNOSIS — Z823 Family history of stroke: Secondary | ICD-10-CM

## 2016-05-11 DIAGNOSIS — Z951 Presence of aortocoronary bypass graft: Secondary | ICD-10-CM

## 2016-05-11 DIAGNOSIS — E876 Hypokalemia: Secondary | ICD-10-CM | POA: Diagnosis present

## 2016-05-11 DIAGNOSIS — Z950 Presence of cardiac pacemaker: Secondary | ICD-10-CM

## 2016-05-11 DIAGNOSIS — F0391 Unspecified dementia with behavioral disturbance: Secondary | ICD-10-CM | POA: Diagnosis present

## 2016-05-11 DIAGNOSIS — E878 Other disorders of electrolyte and fluid balance, not elsewhere classified: Secondary | ICD-10-CM | POA: Diagnosis present

## 2016-05-11 DIAGNOSIS — Z79899 Other long term (current) drug therapy: Secondary | ICD-10-CM

## 2016-05-11 DIAGNOSIS — Z781 Physical restraint status: Secondary | ICD-10-CM

## 2016-05-11 DIAGNOSIS — Z8719 Personal history of other diseases of the digestive system: Secondary | ICD-10-CM

## 2016-05-11 DIAGNOSIS — Z79891 Long term (current) use of opiate analgesic: Secondary | ICD-10-CM

## 2016-05-11 DIAGNOSIS — R627 Adult failure to thrive: Secondary | ICD-10-CM | POA: Diagnosis present

## 2016-05-11 DIAGNOSIS — R197 Diarrhea, unspecified: Secondary | ICD-10-CM | POA: Diagnosis not present

## 2016-05-11 DIAGNOSIS — F05 Delirium due to known physiological condition: Secondary | ICD-10-CM | POA: Diagnosis present

## 2016-05-11 DIAGNOSIS — I5033 Acute on chronic diastolic (congestive) heart failure: Secondary | ICD-10-CM | POA: Diagnosis present

## 2016-05-11 DIAGNOSIS — R52 Pain, unspecified: Secondary | ICD-10-CM

## 2016-05-11 DIAGNOSIS — Z7952 Long term (current) use of systemic steroids: Secondary | ICD-10-CM

## 2016-05-11 DIAGNOSIS — E43 Unspecified severe protein-calorie malnutrition: Secondary | ICD-10-CM | POA: Diagnosis present

## 2016-05-11 DIAGNOSIS — G9341 Metabolic encephalopathy: Principal | ICD-10-CM | POA: Diagnosis present

## 2016-05-11 DIAGNOSIS — N179 Acute kidney failure, unspecified: Secondary | ICD-10-CM | POA: Diagnosis present

## 2016-05-11 DIAGNOSIS — Z801 Family history of malignant neoplasm of trachea, bronchus and lung: Secondary | ICD-10-CM

## 2016-05-11 DIAGNOSIS — I480 Paroxysmal atrial fibrillation: Secondary | ICD-10-CM | POA: Diagnosis present

## 2016-05-11 DIAGNOSIS — Z6832 Body mass index (BMI) 32.0-32.9, adult: Secondary | ICD-10-CM

## 2016-05-11 DIAGNOSIS — Z884 Allergy status to anesthetic agent status: Secondary | ICD-10-CM

## 2016-05-11 DIAGNOSIS — J9811 Atelectasis: Secondary | ICD-10-CM | POA: Diagnosis present

## 2016-05-11 DIAGNOSIS — G9340 Encephalopathy, unspecified: Secondary | ICD-10-CM | POA: Diagnosis present

## 2016-05-11 DIAGNOSIS — J449 Chronic obstructive pulmonary disease, unspecified: Secondary | ICD-10-CM | POA: Diagnosis present

## 2016-05-11 DIAGNOSIS — E861 Hypovolemia: Secondary | ICD-10-CM | POA: Diagnosis not present

## 2016-05-11 DIAGNOSIS — Z96659 Presence of unspecified artificial knee joint: Secondary | ICD-10-CM | POA: Diagnosis present

## 2016-05-11 DIAGNOSIS — E860 Dehydration: Secondary | ICD-10-CM | POA: Diagnosis present

## 2016-05-11 DIAGNOSIS — I272 Pulmonary hypertension, unspecified: Secondary | ICD-10-CM | POA: Diagnosis present

## 2016-05-11 DIAGNOSIS — N200 Calculus of kidney: Secondary | ICD-10-CM | POA: Diagnosis present

## 2016-05-11 DIAGNOSIS — Z4659 Encounter for fitting and adjustment of other gastrointestinal appliance and device: Secondary | ICD-10-CM

## 2016-05-11 DIAGNOSIS — Z8249 Family history of ischemic heart disease and other diseases of the circulatory system: Secondary | ICD-10-CM

## 2016-05-11 DIAGNOSIS — R0902 Hypoxemia: Secondary | ICD-10-CM

## 2016-05-11 DIAGNOSIS — I2510 Atherosclerotic heart disease of native coronary artery without angina pectoris: Secondary | ICD-10-CM | POA: Diagnosis present

## 2016-05-11 DIAGNOSIS — I472 Ventricular tachycardia: Secondary | ICD-10-CM | POA: Diagnosis not present

## 2016-05-11 DIAGNOSIS — N390 Urinary tract infection, site not specified: Secondary | ICD-10-CM | POA: Diagnosis present

## 2016-05-11 DIAGNOSIS — I4892 Unspecified atrial flutter: Secondary | ICD-10-CM | POA: Diagnosis not present

## 2016-05-11 DIAGNOSIS — R748 Abnormal levels of other serum enzymes: Secondary | ICD-10-CM

## 2016-05-11 DIAGNOSIS — R5381 Other malaise: Secondary | ICD-10-CM

## 2016-05-11 DIAGNOSIS — R451 Restlessness and agitation: Secondary | ICD-10-CM | POA: Diagnosis not present

## 2016-05-11 DIAGNOSIS — J9601 Acute respiratory failure with hypoxia: Secondary | ICD-10-CM | POA: Diagnosis not present

## 2016-05-11 DIAGNOSIS — I471 Supraventricular tachycardia: Secondary | ICD-10-CM | POA: Diagnosis not present

## 2016-05-11 DIAGNOSIS — I495 Sick sinus syndrome: Secondary | ICD-10-CM

## 2016-05-11 DIAGNOSIS — R7989 Other specified abnormal findings of blood chemistry: Secondary | ICD-10-CM | POA: Diagnosis present

## 2016-05-11 DIAGNOSIS — F329 Major depressive disorder, single episode, unspecified: Secondary | ICD-10-CM | POA: Diagnosis present

## 2016-05-11 DIAGNOSIS — E669 Obesity, unspecified: Secondary | ICD-10-CM | POA: Diagnosis present

## 2016-05-11 DIAGNOSIS — K838 Other specified diseases of biliary tract: Secondary | ICD-10-CM | POA: Diagnosis present

## 2016-05-11 DIAGNOSIS — Z833 Family history of diabetes mellitus: Secondary | ICD-10-CM

## 2016-05-11 DIAGNOSIS — Z66 Do not resuscitate: Secondary | ICD-10-CM | POA: Diagnosis not present

## 2016-05-11 DIAGNOSIS — J9690 Respiratory failure, unspecified, unspecified whether with hypoxia or hypercapnia: Secondary | ICD-10-CM

## 2016-05-11 DIAGNOSIS — E870 Hyperosmolality and hypernatremia: Secondary | ICD-10-CM | POA: Diagnosis present

## 2016-05-11 DIAGNOSIS — D6489 Other specified anemias: Secondary | ICD-10-CM | POA: Diagnosis present

## 2016-05-11 DIAGNOSIS — Z452 Encounter for adjustment and management of vascular access device: Secondary | ICD-10-CM

## 2016-05-11 DIAGNOSIS — K7460 Unspecified cirrhosis of liver: Secondary | ICD-10-CM | POA: Diagnosis present

## 2016-05-11 DIAGNOSIS — I493 Ventricular premature depolarization: Secondary | ICD-10-CM | POA: Diagnosis not present

## 2016-05-11 DIAGNOSIS — Z880 Allergy status to penicillin: Secondary | ICD-10-CM

## 2016-05-11 DIAGNOSIS — M545 Low back pain: Secondary | ICD-10-CM | POA: Diagnosis present

## 2016-05-11 DIAGNOSIS — I110 Hypertensive heart disease with heart failure: Secondary | ICD-10-CM | POA: Diagnosis present

## 2016-05-11 DIAGNOSIS — I491 Atrial premature depolarization: Secondary | ICD-10-CM | POA: Diagnosis not present

## 2016-05-11 DIAGNOSIS — Z0189 Encounter for other specified special examinations: Secondary | ICD-10-CM

## 2016-05-11 DIAGNOSIS — R740 Nonspecific elevation of levels of transaminase and lactic acid dehydrogenase [LDH]: Secondary | ICD-10-CM | POA: Diagnosis present

## 2016-05-11 DIAGNOSIS — M1990 Unspecified osteoarthritis, unspecified site: Secondary | ICD-10-CM | POA: Diagnosis present

## 2016-05-11 DIAGNOSIS — Z87891 Personal history of nicotine dependence: Secondary | ICD-10-CM

## 2016-05-11 DIAGNOSIS — I4581 Long QT syndrome: Secondary | ICD-10-CM | POA: Diagnosis present

## 2016-05-11 DIAGNOSIS — F22 Delusional disorders: Secondary | ICD-10-CM

## 2016-05-11 DIAGNOSIS — E785 Hyperlipidemia, unspecified: Secondary | ICD-10-CM | POA: Diagnosis present

## 2016-05-11 DIAGNOSIS — I252 Old myocardial infarction: Secondary | ICD-10-CM

## 2016-05-11 DIAGNOSIS — F03918 Unspecified dementia, unspecified severity, with other behavioral disturbance: Secondary | ICD-10-CM

## 2016-05-11 DIAGNOSIS — I959 Hypotension, unspecified: Secondary | ICD-10-CM | POA: Diagnosis not present

## 2016-05-11 DIAGNOSIS — I5032 Chronic diastolic (congestive) heart failure: Secondary | ICD-10-CM

## 2016-05-11 LAB — CBC WITH DIFFERENTIAL/PLATELET
Basophils Absolute: 0 10*3/uL (ref 0.0–0.1)
Basophils Relative: 0 %
Eosinophils Absolute: 0.3 10*3/uL (ref 0.0–0.7)
Eosinophils Relative: 2 %
HCT: 30.7 % — ABNORMAL LOW (ref 36.0–46.0)
Hemoglobin: 10.1 g/dL — ABNORMAL LOW (ref 12.0–15.0)
Lymphocytes Relative: 23 %
Lymphs Abs: 2.6 10*3/uL (ref 0.7–4.0)
MCH: 32.4 pg (ref 26.0–34.0)
MCHC: 32.9 g/dL (ref 30.0–36.0)
MCV: 98.4 fL (ref 78.0–100.0)
Monocytes Absolute: 1.4 10*3/uL — ABNORMAL HIGH (ref 0.1–1.0)
Monocytes Relative: 12 %
Neutro Abs: 7.2 10*3/uL (ref 1.7–7.7)
Neutrophils Relative %: 63 %
Platelets: 123 10*3/uL — ABNORMAL LOW (ref 150–400)
RBC: 3.12 MIL/uL — ABNORMAL LOW (ref 3.87–5.11)
RDW: 15.1 % (ref 11.5–15.5)
WBC: 11.5 10*3/uL — ABNORMAL HIGH (ref 4.0–10.5)

## 2016-05-11 LAB — RAPID URINE DRUG SCREEN, HOSP PERFORMED
Amphetamines: NOT DETECTED
Barbiturates: NOT DETECTED
Benzodiazepines: NOT DETECTED
Cocaine: NOT DETECTED
Opiates: NOT DETECTED
Tetrahydrocannabinol: NOT DETECTED

## 2016-05-11 LAB — URINALYSIS, ROUTINE W REFLEX MICROSCOPIC
Bilirubin Urine: NEGATIVE
Glucose, UA: NEGATIVE mg/dL
Ketones, ur: NEGATIVE mg/dL
Nitrite: NEGATIVE
Protein, ur: NEGATIVE mg/dL
Specific Gravity, Urine: 1.017 (ref 1.005–1.030)
pH: 5 (ref 5.0–8.0)

## 2016-05-11 LAB — BLOOD GAS, ARTERIAL
Acid-Base Excess: 7.4 mmol/L — ABNORMAL HIGH (ref 0.0–2.0)
Bicarbonate: 31.9 mmol/L — ABNORMAL HIGH (ref 20.0–28.0)
Drawn by: 11249
FIO2: 0.21
O2 Saturation: 51.6 %
Patient temperature: 99
pCO2 arterial: 47.6 mmHg (ref 32.0–48.0)
pH, Arterial: 7.443 (ref 7.350–7.450)

## 2016-05-11 LAB — COMPREHENSIVE METABOLIC PANEL
ALT: 11 U/L — ABNORMAL LOW (ref 14–54)
AST: 46 U/L — ABNORMAL HIGH (ref 15–41)
Albumin: 4 g/dL (ref 3.5–5.0)
Alkaline Phosphatase: 64 U/L (ref 38–126)
Anion gap: 12 (ref 5–15)
BUN: 12 mg/dL (ref 6–20)
CO2: 28 mmol/L (ref 22–32)
Calcium: 9.5 mg/dL (ref 8.9–10.3)
Chloride: 106 mmol/L (ref 101–111)
Creatinine, Ser: 1.19 mg/dL — ABNORMAL HIGH (ref 0.44–1.00)
GFR calc Af Amer: 52 mL/min — ABNORMAL LOW (ref >60)
GFR calc non Af Amer: 44 mL/min — ABNORMAL LOW (ref >60)
Glucose, Bld: 90 mg/dL (ref 65–99)
Potassium: 2.7 mmol/L — CL (ref 3.5–5.1)
Sodium: 146 mmol/L — ABNORMAL HIGH (ref 135–145)
Total Bilirubin: 2.5 mg/dL — ABNORMAL HIGH (ref 0.3–1.2)
Total Protein: 6.6 g/dL (ref 6.5–8.1)

## 2016-05-11 LAB — MAGNESIUM: Magnesium: 1.5 mg/dL — ABNORMAL LOW (ref 1.7–2.4)

## 2016-05-11 LAB — I-STAT TROPONIN, ED: Troponin i, poc: 0 ng/mL (ref 0.00–0.08)

## 2016-05-11 LAB — I-STAT CHEM 8, ED
BUN: 9 mg/dL (ref 6–20)
Calcium, Ion: 1.1 mmol/L — ABNORMAL LOW (ref 1.15–1.40)
Chloride: 102 mmol/L (ref 101–111)
Creatinine, Ser: 1.2 mg/dL — ABNORMAL HIGH (ref 0.44–1.00)
Glucose, Bld: 85 mg/dL (ref 65–99)
HCT: 32 % — ABNORMAL LOW (ref 36.0–46.0)
Hemoglobin: 10.9 g/dL — ABNORMAL LOW (ref 12.0–15.0)
Potassium: 2.8 mmol/L — ABNORMAL LOW (ref 3.5–5.1)
Sodium: 147 mmol/L — ABNORMAL HIGH (ref 135–145)
TCO2: 30 mmol/L (ref 0–100)

## 2016-05-11 LAB — ACETAMINOPHEN LEVEL: Acetaminophen (Tylenol), Serum: <10 ug/mL — ABNORMAL LOW (ref 10–30)

## 2016-05-11 LAB — COOXEMETRY PANEL
Carboxyhemoglobin: 2.9 % — ABNORMAL HIGH (ref 0.5–1.5)
Methemoglobin: 0.9 % (ref 0.0–1.5)
O2 Saturation: 96 %
Total hemoglobin: 10.9 g/dL — ABNORMAL LOW (ref 12.0–16.0)

## 2016-05-11 LAB — AMMONIA: Ammonia: 41 umol/L — ABNORMAL HIGH (ref 9–35)

## 2016-05-11 LAB — ETHANOL: Alcohol, Ethyl (B): <5 mg/dL (ref <5)

## 2016-05-11 LAB — SALICYLATE LEVEL: Salicylate Lvl: <7 mg/dL (ref 2.8–30.0)

## 2016-05-11 MED ORDER — ALBUTEROL SULFATE (2.5 MG/3ML) 0.083% IN NEBU
2.5000 mg | INHALATION_SOLUTION | RESPIRATORY_TRACT | Status: DC
  Administered 2016-05-12: 2.5 mg via RESPIRATORY_TRACT
  Filled 2016-05-11: qty 3

## 2016-05-11 MED ORDER — POTASSIUM CHLORIDE IN NACL 20-0.9 MEQ/L-% IV SOLN
INTRAVENOUS | Status: AC
  Administered 2016-05-12 (×2): via INTRAVENOUS
  Filled 2016-05-11 (×2): qty 1000

## 2016-05-11 MED ORDER — HYDRALAZINE HCL 20 MG/ML IJ SOLN: 10.0000 mg | INTRAMUSCULAR | Status: DC | PRN

## 2016-05-11 MED ORDER — SODIUM CHLORIDE 0.9 % IV BOLUS (SEPSIS)
500.0000 mL | Freq: Once | INTRAVENOUS | Status: AC
  Administered 2016-05-11: 500 mL via INTRAVENOUS

## 2016-05-11 MED ORDER — FLUTICASONE PROPIONATE 50 MCG/ACT NA SUSP
2.0000 | Freq: Every day | NASAL | Status: DC
  Administered 2016-05-12 – 2016-05-21 (×9): 2 via NASAL
  Filled 2016-05-11: qty 16

## 2016-05-11 MED ORDER — ACETAMINOPHEN 325 MG PO TABS
650.0000 mg | ORAL_TABLET | Freq: Four times a day (QID) | ORAL | Status: DC | PRN
  Administered 2016-05-18: 650 mg via ORAL
  Filled 2016-05-11: qty 2

## 2016-05-11 MED ORDER — BUDESONIDE 0.25 MG/2ML IN SUSP
0.2500 mg | Freq: Two times a day (BID) | RESPIRATORY_TRACT | Status: DC
  Administered 2016-05-12 (×2): 0.25 mg via RESPIRATORY_TRACT
  Filled 2016-05-11 (×2): qty 2

## 2016-05-11 MED ORDER — POTASSIUM CHLORIDE 10 MEQ/100ML IV SOLN
10.0000 meq | INTRAVENOUS | Status: DC
  Administered 2016-05-11 (×2): 10 meq via INTRAVENOUS
  Filled 2016-05-11 (×3): qty 100

## 2016-05-11 MED ORDER — SODIUM CHLORIDE 0.9 % IV SOLN: 30.0000 meq | Freq: Once | INTRAVENOUS | Status: DC

## 2016-05-11 MED ORDER — ALBUTEROL SULFATE (2.5 MG/3ML) 0.083% IN NEBU: 2.5000 mg | INHALATION_SOLUTION | RESPIRATORY_TRACT | Status: DC | PRN

## 2016-05-11 MED ORDER — ACETAMINOPHEN 650 MG RE SUPP: 650.0000 mg | Freq: Four times a day (QID) | RECTAL | Status: DC | PRN

## 2016-05-11 MED ORDER — METOPROLOL TARTRATE 5 MG/5ML IV SOLN
2.5000 mg | Freq: Four times a day (QID) | INTRAVENOUS | Status: DC
  Administered 2016-05-12 (×3): 2.5 mg via INTRAVENOUS
  Filled 2016-05-11 (×5): qty 5

## 2016-05-11 MED ORDER — POTASSIUM CHLORIDE 10 MEQ/100ML IV SOLN
10.0000 meq | INTRAVENOUS | Status: AC
  Administered 2016-05-12: 10 meq via INTRAVENOUS
  Filled 2016-05-11 (×3): qty 100

## 2016-05-11 MED ORDER — SODIUM CHLORIDE 0.9 % IV SOLN
30.0000 meq | Freq: Once | INTRAVENOUS | Status: DC
  Filled 2016-05-11: qty 15

## 2016-05-11 NOTE — H&P (Addendum)
History and Physical    Gabrielle PalauVirginia W [name] NWG:[id number] DOB: January 29, 1946 DOA: 05/11/2016  PCP: Irving CopasHACKER,ROBERT KELLER, MD  Patient coming from: Home.  History obtained from patient's sister.  Chief Complaint: Confusion.  HPI: Gabrielle [name] is a 71 y.o. female with history of CAD status post CABG, diastolic CHF, hypertension, atrial fibrillation not on anticoagulation secondary to severe GI bleed, tachybradycardia syndrome status post pacemaker placement was brought to the ER after patient was found to be increasingly confused by patient's sister. Patient's sister states that last week patient was found to be mildly confused and patient's other family member did find patient talking nonsensical. Today patient's sister came back from travel and check done patient when patient was found to be naked walking around at home confused and talking about family members being dead. Patient also is complaining of low back pain. As per patient's sister patient does not drink alcohol and had quit smoking cigarettes 2 months ago.  ED Course: In the ER CT head was unremarkable. Chest x-ray did not show any definite infiltrates. UA shows leukocyte esterase positive. Blood cultures are obtained. Patient will be admitted for further observation since patient is confused. On exam patient is alert awake but not oriented to name place person or time. Moves all extremities.  Review of Systems: As per HPI, rest all negative.   Past Medical History:  Diagnosis Date  . Anemia   . Arthritis   . Asthma   . Atrial fibrillation with controlled ventricular response (HCC) 11/2009   a. not on anticoagulation due to recurrent GI bleeding while on Eliquis.  . Chronic bronchitis   . Chronic diastolic CHF (congestive heart failure) (HCC)    a. Echo 12/2015: EF 55-60% w/ Grade 3 DD  . Chronic headache   . COPD (chronic obstructive pulmonary disease) (HCC)   . Depression   . Heart attack   . Hyperlipidemia   .  Hypertension     Past Surgical History:  Procedure Laterality Date  . CAD-CABG     x7, brief post-op Atrial Fib  . CESAREAN SECTION     x2  . COLONOSCOPY WITH PROPOFOL N/A 10/24/2015   Procedure: COLONOSCOPY WITH PROPOFOL;  Surgeon: Carman ChingJames Edwards, MD;  [facility];  Service: Endoscopy;  Laterality: N/A;  . CORONARY ARTERY BYPASS GRAFT  2011  . EP IMPLANTABLE DEVICE N/A 12/26/2015   Procedure: Pacemaker Implant;  Surgeon: Marinus MawGregg W Taylor, MD;  [facility];  Service: Cardiovascular;  Laterality: N/A;  . ESOPHAGOGASTRODUODENOSCOPY N/A 10/22/2015   Procedure: ESOPHAGOGASTRODUODENOSCOPY (EGD);  Surgeon: Carman ChingJames Edwards, MD;  [facility];  Service: Endoscopy;  Laterality: N/A;  . ESOPHAGOGASTRODUODENOSCOPY N/A 12/24/2015   Procedure: ESOPHAGOGASTRODUODENOSCOPY (EGD);  Surgeon: Carman ChingJames Edwards, MD;  [facility];  Service: Endoscopy;  Laterality: N/A;  . JOINT REPLACEMENT Bilateral   . TONSILLECTOMY    . TOTAL KNEE ARTHROPLASTY       reports that she has quit smoking. Her smoking use included Cigarettes. She smoked 0.20 packs per day. She has never used smokeless tobacco. She reports that she does not drink alcohol or use drugs.  Allergies  Allergen Reactions  . Novocain [Procaine] Hives and Other (See Comments)    palpitations  . Penicillins Hives    Has patient had a PCN reaction causing immediate rash, facial/tongue/throat swelling, SOB or lightheadedness with hypotension: Yes Has patient had a PCN reaction causing severe rash involving mucus membranes or skin necrosis: No Has patient had a PCN reaction  that required hospitalization No Has patient had a PCN reaction occurring within the last 10 years: No If all of the above answers are "NO", then may proceed with Cephalosporin use.    Family History  Problem Relation Age of Onset  . Diabetes Mother   . Hypertension Mother   . Heart disease Mother     before age 30  . Heart disease  Father     before age 82  . Hypertension Father   . Heart attack Father   . Lung cancer Maternal Grandfather   . Lung cancer Paternal Grandfather   . Hypertension Daughter   . Stroke Paternal Grandmother     Prior to Admission medications   Medication Sig Start Date End Date Taking? Authorizing Provider  albuterol (PROVENTIL HFA;VENTOLIN HFA) 108 (90 Base) MCG/ACT inhaler Inhale 2 puffs into the lungs every 6 (six) hours as needed for wheezing or shortness of breath.    Historical Provider, MD  ALPRAZolam Prudy Feeler) 0.5 MG tablet Take 1 tablet (0.5 mg total) by mouth 2 (two) times daily as needed for anxiety. 10/30/15   Clanford Cyndie Mull, MD  atorvastatin (LIPITOR) 20 MG tablet Take 20 mg by mouth daily.    Historical Provider, MD  budesonide-formoterol (SYMBICORT) 160-4.5 MCG/ACT inhaler Inhale 2 puffs into the lungs 2 (two) times daily. 01/15/14   Storm Frisk, MD  clobetasol cream (TEMOVATE) 0.05 % Apply 1 application topically daily as needed (itching).     Historical Provider, MD  docusate sodium (COLACE) 100 MG capsule Take 100 mg by mouth daily as needed for moderate constipation.     Historical Provider, MD  fluticasone (FLONASE) 50 MCG/ACT nasal spray Place 2 sprays into both nostrils daily. 12/21/15   Albertine Grates, MD  furosemide (LASIX) 20 MG tablet Take 4 tablets (80 mg total) by mouth daily. Patient may take on additional one as needed for swelling 04/28/16   Jake Bathe, MD  guaiFENesin (MUCINEX) 600 MG 12 hr tablet Take 1 tablet (600 mg total) by mouth 2 (two) times daily. 12/20/15   Albertine Grates, MD  losartan (COZAAR) 50 MG tablet Take 0.5 tablets (25 mg total) by mouth daily. 12/28/15   Elease Etienne, MD  metoprolol succinate (TOPROL-XL) 25 MG 24 hr tablet Take 1 tablet (25 mg total) by mouth daily. 01/16/16   Jake Bathe, MD  oxymetazoline (AFRIN) 0.05 % nasal spray Place 2 sprays into both nostrils 2 (two) times daily as needed for congestion.     Historical Provider, MD    pantoprazole (PROTONIX) 40 MG tablet Take 40 mg by mouth daily before breakfast.  10/10/14   Historical Provider, MD  Polyethyl Glycol-Propyl Glycol (SYSTANE ULTRA) 0.4-0.3 % SOLN Place 1 drop into both eyes daily as needed (for dry eyes).     Historical Provider, MD  polyethylene glycol (MIRALAX / GLYCOLAX) packet Take 17 g by mouth daily as needed for mild constipation. 10/30/15   Clanford Cyndie Mull, MD  ranitidine (ZANTAC) 150 MG tablet Take 150 mg by mouth 2 (two) times daily. 01/01/16   Historical Provider, MD  sucralfate (CARAFATE) 1 GM/10ML suspension Take 10 mLs (1 g total) by mouth 4 (four) times daily -  with meals and at bedtime. 12/28/15   Elease Etienne, MD  tiZANidine (ZANAFLEX) 4 MG tablet Take 4 mg by mouth at bedtime as needed for muscle spasms.    Historical Provider, MD  traMADol (ULTRAM) 50 MG tablet Take 1 tablet (50 mg total) by  mouth every 6 (six) hours as needed for moderate pain. 10/30/15   Clanford Cyndie Mull, MD    Physical Exam: Vitals:   05/11/16 1953  BP: 137/82  Pulse: 93  Resp: 20  Temp: 99 F (37.2 C)  TempSrc: Oral  SpO2: 93%      Constitutional: Moderately built and nourished. Vitals:   05/11/16 1953  BP: 137/82  Pulse: 93  Resp: 20  Temp: 99 F (37.2 C)  TempSrc: Oral  SpO2: 93%   Eyes: Anicteric no pallor. ENMT: No discharge from the ears eyes nose and mouth. Neck: No mass felt. No neck rigidity. Respiratory: No rhonchi or crepitations. Cardiovascular: S1 and S2 heard no murmurs appreciated. Abdomen: Soft nontender bowel sounds present. No guarding or rigidity. Musculoskeletal: No edema. No joint effusion. Skin: No rash. Skin appears warm. Neurologic: Patient is alert and awake but confused and not oriented to name place or person or time. Moves all extremities. Psychiatric: Appears confused.   Labs on Admission: I have personally reviewed following labs and imaging studies  CBC:  Recent Labs Lab 05/11/16 2032 05/11/16 2041   WBC 11.5*  --   NEUTROABS 7.2  --   HGB 10.1* 10.9*  HCT 30.7* 32.0*  MCV 98.4  --   PLT 123*  --    Basic Metabolic Panel:  Recent Labs Lab 05/11/16 2032 05/11/16 2041  NA 146* 147*  K 2.7* 2.8*  CL 106 102  CO2 28  --   GLUCOSE 90 85  BUN 12 9  CREATININE 1.19* 1.20*  CALCIUM 9.5  --    GFR: Estimated Creatinine Clearance: 42.7 mL/min (by C-G formula based on SCr of 1.2 mg/dL (H)). Liver Function Tests:  Recent Labs Lab 05/11/16 2032  AST 46*  ALT 11*  ALKPHOS 64  BILITOT 2.5*  PROT 6.6  ALBUMIN 4.0   No results for input(s): LIPASE, AMYLASE in the last 168 hours.  Recent Labs Lab 05/11/16 2032  AMMONIA 41*   Coagulation Profile: No results for input(s): INR, PROTIME in the last 168 hours. Cardiac Enzymes: No results for input(s): CKTOTAL, CKMB, CKMBINDEX, TROPONINI in the last 168 hours. BNP (last 3 results) No results for input(s): PROBNP in the last 8760 hours. HbA1C: No results for input(s): HGBA1C in the last 72 hours. CBG: No results for input(s): GLUCAP in the last 168 hours. Lipid Profile: No results for input(s): CHOL, HDL, LDLCALC, TRIG, CHOLHDL, LDLDIRECT in the last 72 hours. Thyroid Function Tests: No results for input(s): TSH, T4TOTAL, FREET4, T3FREE, THYROIDAB in the last 72 hours. Anemia Panel: No results for input(s): VITAMINB12, FOLATE, FERRITIN, TIBC, IRON, RETICCTPCT in the last 72 hours. Urine analysis:    Component Value Date/Time   COLORURINE YELLOW 05/11/2016 2000   APPEARANCEUR HAZY (A) 05/11/2016 2000   LABSPEC 1.017 05/11/2016 2000   PHURINE 5.0 05/11/2016 2000   GLUCOSEU NEGATIVE 05/11/2016 2000   HGBUR LARGE (A) 05/11/2016 2000   BILIRUBINUR NEGATIVE 05/11/2016 2000   KETONESUR NEGATIVE 05/11/2016 2000   PROTEINUR NEGATIVE 05/11/2016 2000   UROBILINOGEN 1.0 12/02/2010 1023   NITRITE NEGATIVE 05/11/2016 2000   LEUKOCYTESUR TRACE (A) 05/11/2016 2000   Sepsis Labs: @LABRCNTIP (procalcitonin:4,lacticidven:4) )No  results found for this or any previous visit (from the past 240 hour(s)).   Radiological Exams on Admission: Ct Head Wo Contrast  Result Date: 05/11/2016 CLINICAL DATA:  71 year old female with altered mental status. EXAM: CT HEAD WITHOUT CONTRAST TECHNIQUE: Contiguous axial images were obtained from the base of the skull through  the vertex without intravenous contrast. COMPARISON:  None. FINDINGS: Brain: There is mild age-related atrophy and chronic microvascular ischemic changes. There is no acute intracranial hemorrhage. No mass effect or midline shift noted. No intra-axial fluid collection. Vascular: No hyperdense vessel or unexpected calcification. Skull: Normal. Negative for fracture or focal lesion. Sinuses/Orbits: No acute finding. Other: None. IMPRESSION: No acute intracranial hemorrhage. Mild age-related atrophy and chronic microvascular ischemic disease. If symptoms persist and there are no contraindications, MRI may provide better evaluation if clinically indicated. Electronically Signed   By: Elgie Collard M.D.   On: 05/11/2016 21:00     Assessment/Plan Principal Problem:   Acute encephalopathy Active Problems:   Essential hypertension   PAF (paroxysmal atrial fibrillation) (HCC)   Tachy-brady syndrome (HCC)   Hypokalemia    1. Acute encephalopathy - cause not clear. Patient's temperatures around 90F. UA shows possibility of UTI for which I have placed patient on doxycycline. Ammonia levels is only mildly elevated. We'll recheck ammonia in a.m. If patient develops fever after admission I will place patient on antibiotics for meningitis/encephalitis and get lumbar puncture. Unable to do MRI due to pacemaker. We'll also check ABG for carbon dioxide retention and also carbon monoxide levels. 2. Paroxysmal atrial fibrillation and tachybradycardia syndrome - will place patient on scheduled dose of IV metoprolol. Not on anticoagulation secondary to severe GI bleed last  year. 3. Hypokalemia - replace and recheck. 4. Low back pain - check XRAY pevis and L spine. 5. Acute renal failure probably from dehydration - hydrate and recheck metabolic panel. 6. History of diastolic CHF presently receiving IV fluids. 7. History of COPD not actively wheezing. 8. History of CAD status post CABG.   DVT prophylaxis: SCDs. Code Status: DO NOT RESUSCITATE as confirmed by patient's twin sister.  Family Communication: Patient's twin sister.  Disposition Plan: To be determined.  Consults called: None.  Admission status: Observation.    Eduard Clos MD [facility] Pager [phone].  If 7PM-7AM, please contact night-coverage [email or web address] Password [facility]  05/11/2016, 11:12 PM

## 2016-05-11 NOTE — ED Notes (Signed)
pts family visiting hours over. Pt sleeping calmly

## 2016-05-11 NOTE — ED Notes (Signed)
Placed on cardiac monitor 

## 2016-05-11 NOTE — ED Notes (Signed)
Bed: WA27 Expected date:  Expected time:  Means of arrival:  Comments: 71 yo f anxiety

## 2016-05-11 NOTE — ED Triage Notes (Signed)
Legal guardian is daughter Gabrielle [name]  [phone]  She does not need to be by herself  Mother lives in poor living conditions and family  over whelmed. Mother can not do basic ADL's/ incon

## 2016-05-11 NOTE — ED Triage Notes (Signed)
Pt comes to ed/ TCU for psych consult, [place] Pt's family is over whelmed with care giving responsibilities. Pt has a medical hx of ( heart problems, depression, HTN, COPD and falls after speaking with family members pt is confused possible dementia and having illusions she is heaven" and not making good judgments, leaving multiple  the space heaters on all night. Pt was reports she was shaking and suffers from poor hygiene.  Provider in room now.

## 2016-05-11 NOTE — ED Provider Notes (Signed)
[facility]-EMERGENCY DEPT Provider Note   CSN: [id number] Arrival date & time: 05/11/16  1906     History   Chief Complaint Chief Complaint  Patient presents with  . Mental Health Problem    IVC danger self and others ( family in room)      HPI Gabrielle [name] is a 71 y.o. female.  HPI Patient lives alone. Brought in by police after family called 911. Patient's had increased confusion of the last 8 months. Appears to be acutely worsening of the last 4 days. Family states patient has been increasingly paranoid and believes that all family members and herself have died and are currently having. Unknown whether she's had any recent falls. Patient was found naked in her house with multiple seizures turned on. IVC paperwork was initiated due to concern that patient is not able to take care of herself. She lives alone. She currently denies any pain. She is a poor historian and level V caveat applies. Past Medical History:  Diagnosis Date  . Anemia   . Arthritis   . Asthma   . Atrial fibrillation with controlled ventricular response (HCC) 11/2009   a. not on anticoagulation due to recurrent GI bleeding while on Eliquis.  . Chronic bronchitis   . Chronic diastolic CHF (congestive heart failure) (HCC)    a. Echo 12/2015: EF 55-60% w/ Grade 3 DD  . Chronic headache   . COPD (chronic obstructive pulmonary disease) (HCC)   . Depression   . Heart attack   . Hyperlipidemia   . Hypertension     Patient Active Problem List   Diagnosis Date Noted  . Acute encephalopathy 05/11/2016  . Hypokalemia 05/11/2016  . Pressure injury of skin 12/25/2015  . Esophageal ulcer with bleeding 12/25/2015  . Thrombocytopenia (HCC) 12/25/2015  . Acute congestive heart failure (HCC)   . SOB (shortness of breath)   . Dysphagia 12/24/2015  . Tachy-brady syndrome (HCC) 12/24/2015  . Chronic anticoagulation   . Fall   . Prolonged Q-T interval on ECG   . Acute on chronic diastolic CHF (congestive heart  failure) (HCC) 12/16/2015  . Acute respiratory failure with hypoxia (HCC) 12/16/2015  . Community acquired pneumonia of right middle lobe of lung (HCC) 12/16/2015  . Anemia due to blood loss, acute 10/27/2015  . Elevated lactic acid level 10/27/2015  . Systolic heart failure (HCC) 10/27/2015  . Arterial hypotension   . PAF (paroxysmal atrial fibrillation) (HCC) 10/21/2015  . Systolic CHF (HCC) 10/21/2015  . Severe anemia   . Symptomatic anemia   . Coronary artery disease due to lipid rich plaque 02/07/2014  . Obesity 02/07/2014  . Abdominal aortic ectasia (HCC) 09/06/2013  . Encounter for therapeutic drug monitoring 07/13/2013  . Atrial fibrillation (HCC) 12/20/2012  . Atrial fibrillation with controlled ventricular response (HCC) 11/30/2009  . NICOTINE ADDICTION 04/02/2009  . PARAPSORIASIS 12/15/2008  . Hyperlipidemia 12/14/2008  . HEART ATTACK 12/14/2008  . HEADACHE, CHRONIC 12/14/2008  . Depression 12/13/2008  . Essential hypertension 12/13/2008  . COPD exacerbation (HCC) 12/13/2008  . ARTHRITIS 12/13/2008    Past Surgical History:  Procedure Laterality Date  . CAD-CABG     x7, brief post-op Atrial Fib  . CESAREAN SECTION     x2  . COLONOSCOPY WITH PROPOFOL N/A 10/24/2015   Procedure: COLONOSCOPY WITH PROPOFOL;  Surgeon: Carman Ching, MD;  Location: [facility] ENDOSCOPY;  Service: Endoscopy;  Laterality: N/A;  . CORONARY ARTERY BYPASS GRAFT  2011  . EP IMPLANTABLE DEVICE N/A 12/26/2015  Procedure: Pacemaker Implant;  Surgeon: Marinus Maw, MD;  Location: [facility] INVASIVE CV LAB;  Service: Cardiovascular;  Laterality: N/A;  . ESOPHAGOGASTRODUODENOSCOPY N/A 10/22/2015   Procedure: ESOPHAGOGASTRODUODENOSCOPY (EGD);  Surgeon: Carman Ching, MD;  Location[facility] ENDOSCOPY;  Service: Endoscopy;  Laterality: N/A;  . ESOPHAGOGASTRODUODENOSCOPY N/A 12/24/2015   Procedure: ESOPHAGOGASTRODUODENOSCOPY (EGD);  Surgeon: Carman Ching, MD;  Location: [facility] ENDOSCOPY;  Service: Endoscopy;  Laterality:  N/A;  . JOINT REPLACEMENT Bilateral   . TONSILLECTOMY    . TOTAL KNEE ARTHROPLASTY      OB History    No data available       Home Medications    Prior to Admission medications   Medication Sig Start Date End Date Taking? Authorizing Provider  albuterol (PROVENTIL HFA;VENTOLIN HFA) 108 (90 Base) MCG/ACT inhaler Inhale 2 puffs into the lungs every 6 (six) hours as needed for wheezing or shortness of breath.    Historical Provider, MD  ALPRAZolam Prudy Feeler) 0.5 MG tablet Take 1 tablet (0.5 mg total) by mouth 2 (two) times daily as needed for anxiety. 10/30/15   Clanford Cyndie Mull, MD  atorvastatin (LIPITOR) 20 MG tablet Take 20 mg by mouth daily.    Historical Provider, MD  budesonide-formoterol (SYMBICORT) 160-4.5 MCG/ACT inhaler Inhale 2 puffs into the lungs 2 (two) times daily. 01/15/14   Storm Frisk, MD  clobetasol cream (TEMOVATE) 0.05 % Apply 1 application topically daily as needed (itching).     Historical Provider, MD  docusate sodium (COLACE) 100 MG capsule Take 100 mg by mouth daily as needed for moderate constipation.     Historical Provider, MD  fluticasone (FLONASE) 50 MCG/ACT nasal spray Place 2 sprays into both nostrils daily. 12/21/15   Albertine Grates, MD  furosemide (LASIX) 20 MG tablet Take 4 tablets (80 mg total) by mouth daily. Patient may take on additional one as needed for swelling 04/28/16   Jake Bathe, MD  guaiFENesin (MUCINEX) 600 MG 12 hr tablet Take 1 tablet (600 mg total) by mouth 2 (two) times daily. 12/20/15   Albertine Grates, MD  losartan (COZAAR) 50 MG tablet Take 0.5 tablets (25 mg total) by mouth daily. 12/28/15   Elease Etienne, MD  metoprolol succinate (TOPROL-XL) 25 MG 24 hr tablet Take 1 tablet (25 mg total) by mouth daily. 01/16/16   Jake Bathe, MD  oxymetazoline (AFRIN) 0.05 % nasal spray Place 2 sprays into both nostrils 2 (two) times daily as needed for congestion.     Historical Provider, MD  pantoprazole (PROTONIX) 40 MG tablet Take 40 mg by mouth daily  before breakfast.  10/10/14   Historical Provider, MD  Polyethyl Glycol-Propyl Glycol (SYSTANE ULTRA) 0.4-0.3 % SOLN Place 1 drop into both eyes daily as needed (for dry eyes).     Historical Provider, MD  polyethylene glycol (MIRALAX / GLYCOLAX) packet Take 17 g by mouth daily as needed for mild constipation. 10/30/15   Clanford Cyndie Mull, MD  ranitidine (ZANTAC) 150 MG tablet Take 150 mg by mouth 2 (two) times daily. 01/01/16   Historical Provider, MD  sucralfate (CARAFATE) 1 GM/10ML suspension Take 10 mLs (1 g total) by mouth 4 (four) times daily -  with meals and at bedtime. 12/28/15   Elease Etienne, MD  tiZANidine (ZANAFLEX) 4 MG tablet Take 4 mg by mouth at bedtime as needed for muscle spasms.    Historical Provider, MD  traMADol (ULTRAM) 50 MG tablet Take 1 tablet (50 mg total) by mouth every 6 (six) hours  as needed for moderate pain. 10/30/15   Clanford Cyndie MullL Johnson, MD    Family History Family History  Problem Relation Age of Onset  . Diabetes Mother   . Hypertension Mother   . Heart disease Mother     before age 73  . Heart disease Father     before age 73  . Hypertension Father   . Heart attack Father   . Lung cancer Maternal Grandfather   . Lung cancer Paternal Grandfather   . Hypertension Daughter   . Stroke Paternal Grandmother     Social History Social History  Substance Use Topics  . Smoking status: Former Smoker    Packs/day: 0.20    Types: Cigarettes  . Smokeless tobacco: Never Used  . Alcohol use No     Allergies   Novocain [procaine] and Penicillins   Review of Systems Review of Systems  Unable to perform ROS: Mental status change     Physical Exam Updated Vital Signs BP 137/82 (BP Location: Right Arm)   Pulse 93   Temp 99.7 F (37.6 C) (Oral)   Resp 20   LMP  (LMP Unknown)   SpO2 93%   Physical Exam  Constitutional: She appears well-developed and well-nourished.  HENT:  Head: Normocephalic and atraumatic.  Mouth/Throat: Oropharynx is clear  and moist.  No obvious head trauma. No posterior auricular ecchymosis or periorbital ecchymosis. Oropharynx is clear.  Eyes: EOM are normal. Pupils are equal, round, and reactive to light.  Neck: Normal range of motion. Neck supple.  No posterior midline cervical tenderness to palpation. No meningismus.  Cardiovascular: Normal rate and regular rhythm.  Exam reveals no gallop and no friction rub.   No murmur heard. Pulmonary/Chest: Effort normal and breath sounds normal.  Abdominal: Soft. Bowel sounds are normal. There is no tenderness. There is no rebound and no guarding.  Musculoskeletal: Normal range of motion. She exhibits no edema or tenderness.  No midline thoracic or lumbar tenderness. No lower extremity swelling or asymmetry.  Neurological: She is alert.  Oriented to person. 5/5 motor in all extremities. Sensation fully intact. No tremor noted.  Skin: Skin is warm and dry. No rash noted. No erythema.  Erythematous rash to bilateral lower extremities. No definite warmth. Appears most consistent with chronic vascular insufficiency.  Psychiatric:  Patient is paranoid and delusional. Poor insight.  Nursing note and vitals reviewed.    ED Treatments / Results  Labs (all labs ordered are listed, but only abnormal results are displayed) Labs Reviewed  CBC WITH DIFFERENTIAL/PLATELET - Abnormal; Notable for the following:       Result Value   WBC 11.5 (*)    RBC 3.12 (*)    Hemoglobin 10.1 (*)    HCT 30.7 (*)    Platelets 123 (*)    Monocytes Absolute 1.4 (*)    All other components within normal limits  COMPREHENSIVE METABOLIC PANEL - Abnormal; Notable for the following:    Sodium 146 (*)    Potassium 2.7 (*)    Creatinine, Ser 1.19 (*)    AST 46 (*)    ALT 11 (*)    Total Bilirubin 2.5 (*)    GFR calc non Af Amer 44 (*)    GFR calc Af Amer 52 (*)    All other components within normal limits  AMMONIA - Abnormal; Notable for the following:    Ammonia 41 (*)    All other  components within normal limits  URINALYSIS, ROUTINE W REFLEX MICROSCOPIC -  Abnormal; Notable for the following:    APPearance HAZY (*)    Hgb urine dipstick LARGE (*)    Leukocytes, UA TRACE (*)    Bacteria, UA RARE (*)    Squamous Epithelial / LPF 6-30 (*)    All other components within normal limits  ACETAMINOPHEN LEVEL - Abnormal; Notable for the following:    Acetaminophen (Tylenol), Serum <10 (*)    All other components within normal limits  BLOOD GAS, ARTERIAL - Abnormal; Notable for the following:    Bicarbonate 31.9 (*)    Acid-Base Excess 7.4 (*)    All other components within normal limits  COOXEMETRY PANEL - Abnormal; Notable for the following:    Total hemoglobin 10.9 (*)    Carboxyhemoglobin 2.9 (*)    All other components within normal limits  MAGNESIUM - Abnormal; Notable for the following:    Magnesium 1.5 (*)    All other components within normal limits  I-STAT CHEM 8, ED - Abnormal; Notable for the following:    Sodium 147 (*)    Potassium 2.8 (*)    Creatinine, Ser 1.20 (*)    Calcium, Ion 1.10 (*)    Hemoglobin 10.9 (*)    HCT 32.0 (*)    All other components within normal limits  URINE CULTURE  MRSA PCR SCREENING  ETHANOL  RAPID URINE DRUG SCREEN, HOSP PERFORMED  SALICYLATE LEVEL  TSH  BASIC METABOLIC PANEL  CBC  HEPATIC FUNCTION PANEL  I-STAT TROPOININ, ED    EKG  EKG Interpretation None       Radiology Dg Chest 1 View  Result Date: 05/11/2016 CLINICAL DATA:  Dementia/confusion with history of fall EXAM: CHEST 1 VIEW COMPARISON:  12/27/2015 FINDINGS: Post sternotomy changes are again evident. There is a left-sided duo lead pacing device with leads over the right atrium and right ventricle. The lungs are slightly hyperinflated. There is no acute infiltrate or effusion. No pneumothorax. Stable cardiomegaly with central vascular congestion. Aortic atherosclerosis. IMPRESSION: 1. Cardiomegaly with mild central congestion.  No overt edema 2. No  acute infiltrate Electronically Signed   By: Jasmine Pang M.D.   On: 05/11/2016 23:39   Dg Lumbar Spine 2-3 Views  Result Date: 05/11/2016 CLINICAL DATA:  Increased confusion with history of fall EXAM: LUMBAR SPINE - 2-3 VIEW COMPARISON:  12/16/2015 FINDINGS: Five non rib-bearing lumbar type vertebra. SI joints are patent. Lumbar alignment stable. Mild superior endplate compression deformity of L3 minimally progressed. New mild compression deformity of T12 with approximately 25% loss of height anteriorly. Remaining vertebral bodies appear grossly maintained. Mild to moderate narrowing at L2-L3 with mild narrowing at L3-L4, L4-L5 and L5-S1. Dense atherosclerosis of the aorta IMPRESSION: 1. Slight progression of previously noted superior endplate deformity at L3 2. New mild compression deformity of T12 Electronically Signed   By: Jasmine Pang M.D.   On: 05/11/2016 23:37   Dg Pelvis 1-2 Views  Result Date: 05/11/2016 CLINICAL DATA:  Increased confusion with history of fall EXAM: PELVIS - 1-2 VIEW COMPARISON:  12/16/2015 FINDINGS: There is no evidence of pelvic fracture or diastasis. No pelvic bone lesions are seen. Mild arthritis of the bilateral hips. IMPRESSION: No acute osseous abnormality Electronically Signed   By: Jasmine Pang M.D.   On: 05/11/2016 23:37   Ct Head Wo Contrast  Result Date: 05/11/2016 CLINICAL DATA:  71 year old female with altered mental status. EXAM: CT HEAD WITHOUT CONTRAST TECHNIQUE: Contiguous axial images were obtained from the base of the skull through the vertex  without intravenous contrast. COMPARISON:  None. FINDINGS: Brain: There is mild age-related atrophy and chronic microvascular ischemic changes. There is no acute intracranial hemorrhage. No mass effect or midline shift noted. No intra-axial fluid collection. Vascular: No hyperdense vessel or unexpected calcification. Skull: Normal. Negative for fracture or focal lesion. Sinuses/Orbits: No acute finding. Other: None.  IMPRESSION: No acute intracranial hemorrhage. Mild age-related atrophy and chronic microvascular ischemic disease. If symptoms persist and there are no contraindications, MRI may provide better evaluation if clinically indicated. Electronically Signed   By: Elgie Collard M.D.   On: 05/11/2016 21:00    Procedures Procedures (including critical care time)  Medications Ordered in ED Medications  fluticasone (FLONASE) 50 MCG/ACT nasal spray 2 spray (not administered)  metoprolol (LOPRESSOR) injection 2.5 mg (not administered)  hydrALAZINE (APRESOLINE) injection 10 mg (not administered)  budesonide (PULMICORT) nebulizer solution 0.25 mg (0.25 mg Nebulization Not Given 05/12/16 0004)  albuterol (PROVENTIL) (2.5 MG/3ML) 0.083% nebulizer solution 2.5 mg (not administered)  albuterol (PROVENTIL) (2.5 MG/3ML) 0.083% nebulizer solution 2.5 mg (not administered)  acetaminophen (TYLENOL) tablet 650 mg (not administered)    Or  acetaminophen (TYLENOL) suppository 650 mg (not administered)  0.9 % NaCl with KCl 20 mEq/ L  infusion ( Intravenous New Bag/Given 05/12/16 0035)  potassium chloride 10 mEq in 100 mL IVPB (not administered)  doxycycline (VIBRAMYCIN) 100 mg in dextrose 5 % 250 mL IVPB (not administered)  sodium chloride 0.9 % bolus 500 mL (500 mLs Intravenous New Bag/Given 05/11/16 2152)     Initial Impression / Assessment and Plan / ED Course  I have reviewed the triage vital signs and the nursing notes.  Pertinent labs & imaging results that were available during my care of the patient were reviewed by me and considered in my medical decision making (see chart for details).     Patient with hypokalemia and questional UTI. CT head without any acute abnormality. Discussed with medicine service and will admit.  Final Clinical Impressions(s) / ED Diagnoses   Final diagnoses:  Delusional ideas (HCC)  Hypokalemia  Failure to thrive in adult    New Prescriptions Current Discharge  Medication List       Loren Racer, MD 05/12/16 0040

## 2016-05-12 ENCOUNTER — Telehealth: Payer: Self-pay | Admitting: Cardiology

## 2016-05-12 ENCOUNTER — Observation Stay (HOSPITAL_COMMUNITY): Payer: Medicare Other

## 2016-05-12 DIAGNOSIS — D696 Thrombocytopenia, unspecified: Secondary | ICD-10-CM | POA: Diagnosis not present

## 2016-05-12 DIAGNOSIS — E870 Hyperosmolality and hypernatremia: Secondary | ICD-10-CM | POA: Diagnosis present

## 2016-05-12 DIAGNOSIS — F329 Major depressive disorder, single episode, unspecified: Secondary | ICD-10-CM | POA: Diagnosis not present

## 2016-05-12 DIAGNOSIS — I471 Supraventricular tachycardia: Secondary | ICD-10-CM | POA: Diagnosis not present

## 2016-05-12 DIAGNOSIS — I10 Essential (primary) hypertension: Secondary | ICD-10-CM | POA: Diagnosis not present

## 2016-05-12 DIAGNOSIS — I482 Chronic atrial fibrillation: Secondary | ICD-10-CM | POA: Diagnosis not present

## 2016-05-12 DIAGNOSIS — R4182 Altered mental status, unspecified: Secondary | ICD-10-CM | POA: Diagnosis not present

## 2016-05-12 DIAGNOSIS — E876 Hypokalemia: Secondary | ICD-10-CM | POA: Diagnosis present

## 2016-05-12 DIAGNOSIS — I480 Paroxysmal atrial fibrillation: Secondary | ICD-10-CM | POA: Diagnosis present

## 2016-05-12 DIAGNOSIS — I472 Ventricular tachycardia: Secondary | ICD-10-CM | POA: Diagnosis not present

## 2016-05-12 DIAGNOSIS — F0391 Unspecified dementia with behavioral disturbance: Secondary | ICD-10-CM | POA: Diagnosis not present

## 2016-05-12 DIAGNOSIS — I4892 Unspecified atrial flutter: Secondary | ICD-10-CM | POA: Diagnosis not present

## 2016-05-12 DIAGNOSIS — G9340 Encephalopathy, unspecified: Secondary | ICD-10-CM | POA: Diagnosis not present

## 2016-05-12 DIAGNOSIS — R5381 Other malaise: Secondary | ICD-10-CM | POA: Diagnosis not present

## 2016-05-12 DIAGNOSIS — R0902 Hypoxemia: Secondary | ICD-10-CM | POA: Diagnosis not present

## 2016-05-12 DIAGNOSIS — F05 Delirium due to known physiological condition: Secondary | ICD-10-CM | POA: Diagnosis present

## 2016-05-12 DIAGNOSIS — Z8719 Personal history of other diseases of the digestive system: Secondary | ICD-10-CM | POA: Diagnosis not present

## 2016-05-12 DIAGNOSIS — J9601 Acute respiratory failure with hypoxia: Secondary | ICD-10-CM | POA: Diagnosis not present

## 2016-05-12 DIAGNOSIS — J449 Chronic obstructive pulmonary disease, unspecified: Secondary | ICD-10-CM | POA: Diagnosis present

## 2016-05-12 DIAGNOSIS — N390 Urinary tract infection, site not specified: Secondary | ICD-10-CM | POA: Diagnosis present

## 2016-05-12 DIAGNOSIS — I5033 Acute on chronic diastolic (congestive) heart failure: Secondary | ICD-10-CM | POA: Diagnosis present

## 2016-05-12 DIAGNOSIS — E43 Unspecified severe protein-calorie malnutrition: Secondary | ICD-10-CM | POA: Diagnosis present

## 2016-05-12 DIAGNOSIS — E860 Dehydration: Secondary | ICD-10-CM | POA: Diagnosis present

## 2016-05-12 DIAGNOSIS — I495 Sick sinus syndrome: Secondary | ICD-10-CM | POA: Diagnosis present

## 2016-05-12 DIAGNOSIS — I4581 Long QT syndrome: Secondary | ICD-10-CM | POA: Diagnosis present

## 2016-05-12 DIAGNOSIS — Z66 Do not resuscitate: Secondary | ICD-10-CM | POA: Diagnosis not present

## 2016-05-12 DIAGNOSIS — Z452 Encounter for adjustment and management of vascular access device: Secondary | ICD-10-CM | POA: Diagnosis not present

## 2016-05-12 DIAGNOSIS — G9341 Metabolic encephalopathy: Secondary | ICD-10-CM | POA: Diagnosis present

## 2016-05-12 DIAGNOSIS — R410 Disorientation, unspecified: Secondary | ICD-10-CM | POA: Diagnosis not present

## 2016-05-12 DIAGNOSIS — I5032 Chronic diastolic (congestive) heart failure: Secondary | ICD-10-CM | POA: Diagnosis not present

## 2016-05-12 DIAGNOSIS — I110 Hypertensive heart disease with heart failure: Secondary | ICD-10-CM | POA: Diagnosis present

## 2016-05-12 DIAGNOSIS — E878 Other disorders of electrolyte and fluid balance, not elsewhere classified: Secondary | ICD-10-CM | POA: Diagnosis present

## 2016-05-12 DIAGNOSIS — J9811 Atelectasis: Secondary | ICD-10-CM | POA: Diagnosis present

## 2016-05-12 DIAGNOSIS — N179 Acute kidney failure, unspecified: Secondary | ICD-10-CM | POA: Diagnosis present

## 2016-05-12 LAB — BASIC METABOLIC PANEL
Anion gap: 7 (ref 5–15)
BUN: 11 mg/dL (ref 6–20)
CO2: 32 mmol/L (ref 22–32)
Calcium: 8.6 mg/dL — ABNORMAL LOW (ref 8.9–10.3)
Chloride: 107 mmol/L (ref 101–111)
Creatinine, Ser: 1.11 mg/dL — ABNORMAL HIGH (ref 0.44–1.00)
GFR calc Af Amer: 56 mL/min — ABNORMAL LOW (ref >60)
GFR calc non Af Amer: 48 mL/min — ABNORMAL LOW (ref >60)
Glucose, Bld: 126 mg/dL — ABNORMAL HIGH (ref 65–99)
Potassium: 3 mmol/L — ABNORMAL LOW (ref 3.5–5.1)
Sodium: 146 mmol/L — ABNORMAL HIGH (ref 135–145)

## 2016-05-12 LAB — HEPATIC FUNCTION PANEL
ALT: 10 U/L — ABNORMAL LOW (ref 14–54)
AST: 42 U/L — ABNORMAL HIGH (ref 15–41)
Albumin: 3.4 g/dL — ABNORMAL LOW (ref 3.5–5.0)
Alkaline Phosphatase: 56 U/L (ref 38–126)
Bilirubin, Direct: 0.8 mg/dL — ABNORMAL HIGH (ref 0.1–0.5)
Indirect Bilirubin: 2 mg/dL — ABNORMAL HIGH (ref 0.3–0.9)
Total Bilirubin: 2.8 mg/dL — ABNORMAL HIGH (ref 0.3–1.2)
Total Protein: 5.7 g/dL — ABNORMAL LOW (ref 6.5–8.1)

## 2016-05-12 LAB — GLUCOSE, CAPILLARY
Glucose-Capillary: 114 mg/dL — ABNORMAL HIGH (ref 65–99)
Glucose-Capillary: 116 mg/dL — ABNORMAL HIGH (ref 65–99)
Glucose-Capillary: 116 mg/dL — ABNORMAL HIGH (ref 65–99)
Glucose-Capillary: 117 mg/dL — ABNORMAL HIGH (ref 65–99)
Glucose-Capillary: 141 mg/dL — ABNORMAL HIGH (ref 65–99)
Glucose-Capillary: 155 mg/dL — ABNORMAL HIGH (ref 65–99)

## 2016-05-12 LAB — CBC
HCT: 27.9 % — ABNORMAL LOW (ref 36.0–46.0)
Hemoglobin: 9.2 g/dL — ABNORMAL LOW (ref 12.0–15.0)
MCH: 32.3 pg (ref 26.0–34.0)
MCHC: 33 g/dL (ref 30.0–36.0)
MCV: 97.9 fL (ref 78.0–100.0)
Platelets: 96 10*3/uL — ABNORMAL LOW (ref 150–400)
RBC: 2.85 MIL/uL — ABNORMAL LOW (ref 3.87–5.11)
RDW: 15.3 % (ref 11.5–15.5)
WBC: 10.4 10*3/uL (ref 4.0–10.5)

## 2016-05-12 LAB — RAPID URINE DRUG SCREEN, HOSP PERFORMED
Amphetamines: NOT DETECTED
Barbiturates: NOT DETECTED
Benzodiazepines: NOT DETECTED
Cocaine: NOT DETECTED
Opiates: POSITIVE — AB
Tetrahydrocannabinol: NOT DETECTED

## 2016-05-12 LAB — TSH: TSH: 1.712 u[IU]/mL (ref 0.350–4.500)

## 2016-05-12 LAB — VITAMIN B12: Vitamin B-12: 715 pg/mL (ref 180–914)

## 2016-05-12 LAB — MRSA PCR SCREENING: MRSA by PCR: NEGATIVE

## 2016-05-12 LAB — AMMONIA: Ammonia: 31 umol/L (ref 9–35)

## 2016-05-12 MED ORDER — HALOPERIDOL LACTATE 5 MG/ML IJ SOLN
2.5000 mg | Freq: Once | INTRAMUSCULAR | Status: AC
  Administered 2016-05-12: 2.5 mg via INTRAVENOUS
  Filled 2016-05-12: qty 1

## 2016-05-12 MED ORDER — SODIUM CHLORIDE 0.9 % IV SOLN
1500.0000 mg | Freq: Once | INTRAVENOUS | Status: AC
  Administered 2016-05-12: 1500 mg via INTRAVENOUS
  Filled 2016-05-12: qty 1500

## 2016-05-12 MED ORDER — BUDESONIDE 0.5 MG/2ML IN SUSP
0.5000 mg | Freq: Two times a day (BID) | RESPIRATORY_TRACT | Status: DC
  Administered 2016-05-13 – 2016-05-21 (×16): 0.5 mg via RESPIRATORY_TRACT
  Filled 2016-05-12 (×18): qty 2

## 2016-05-12 MED ORDER — POTASSIUM CHLORIDE 10 MEQ/100ML IV SOLN
10.0000 meq | INTRAVENOUS | Status: AC
  Administered 2016-05-12 (×3): 10 meq via INTRAVENOUS
  Filled 2016-05-12 (×3): qty 100

## 2016-05-12 MED ORDER — IPRATROPIUM BROMIDE 0.02 % IN SOLN
0.5000 mg | Freq: Three times a day (TID) | RESPIRATORY_TRACT | Status: DC
  Administered 2016-05-13 – 2016-05-20 (×23): 0.5 mg via RESPIRATORY_TRACT
  Filled 2016-05-12 (×24): qty 2.5

## 2016-05-12 MED ORDER — SODIUM CHLORIDE 0.9 % IV SOLN: 30.0000 meq | Freq: Once | INTRAVENOUS | Status: DC

## 2016-05-12 MED ORDER — VANCOMYCIN HCL IN DEXTROSE 750-5 MG/150ML-% IV SOLN
750.0000 mg | Freq: Two times a day (BID) | INTRAVENOUS | Status: DC
  Administered 2016-05-12 – 2016-05-13 (×3): 750 mg via INTRAVENOUS
  Filled 2016-05-12 (×4): qty 150

## 2016-05-12 MED ORDER — DIPHENHYDRAMINE HCL 50 MG/ML IJ SOLN
25.0000 mg | Freq: Once | INTRAMUSCULAR | Status: AC
  Administered 2016-05-12: 25 mg via INTRAVENOUS
  Filled 2016-05-12: qty 1

## 2016-05-12 MED ORDER — OLANZAPINE 5 MG PO TBDP
5.0000 mg | ORAL_TABLET | Freq: Every day | ORAL | Status: DC
  Administered 2016-05-12 (×2): 5 mg via ORAL
  Filled 2016-05-12 (×2): qty 1

## 2016-05-12 MED ORDER — IPRATROPIUM-ALBUTEROL 0.5-2.5 (3) MG/3ML IN SOLN
3.0000 mL | Freq: Three times a day (TID) | RESPIRATORY_TRACT | Status: DC
  Administered 2016-05-12 (×2): 3 mL via RESPIRATORY_TRACT
  Filled 2016-05-12 (×3): qty 3

## 2016-05-12 MED ORDER — LIDOCAINE HCL 1 % IJ SOLN
INTRAMUSCULAR | Status: AC
  Filled 2016-05-12: qty 20

## 2016-05-12 MED ORDER — DOXYCYCLINE HYCLATE 100 MG IV SOLR
100.0000 mg | Freq: Two times a day (BID) | INTRAVENOUS | Status: DC
  Administered 2016-05-12: 100 mg via INTRAVENOUS
  Filled 2016-05-12 (×2): qty 100

## 2016-05-12 MED ORDER — MORPHINE SULFATE (PF) 4 MG/ML IV SOLN
2.0000 mg | INTRAVENOUS | Status: DC | PRN
Start: 2016-05-12 — End: 2016-05-14
  Administered 2016-05-12 (×4): 2 mg via INTRAVENOUS
  Filled 2016-05-12 (×4): qty 1

## 2016-05-12 MED ORDER — HALOPERIDOL LACTATE 5 MG/ML IJ SOLN
2.0000 mg | Freq: Once | INTRAMUSCULAR | Status: AC
  Administered 2016-05-12: 2 mg via INTRAVENOUS
  Filled 2016-05-12: qty 1

## 2016-05-12 MED ORDER — DEXTROSE 5 % IV SOLN
10.0000 mg/kg | Freq: Two times a day (BID) | INTRAVENOUS | Status: DC
  Administered 2016-05-12 – 2016-05-13 (×4): 600 mg via INTRAVENOUS
  Filled 2016-05-12 (×5): qty 12

## 2016-05-12 MED ORDER — LORAZEPAM 2 MG/ML IJ SOLN
1.0000 mg | Freq: Once | INTRAMUSCULAR | Status: AC
  Administered 2016-05-12: 1 mg via INTRAVENOUS
  Filled 2016-05-12: qty 1

## 2016-05-12 MED ORDER — MAGNESIUM SULFATE 2 GM/50ML IV SOLN
2.0000 g | Freq: Once | INTRAVENOUS | Status: AC
  Administered 2016-05-12: 2 g via INTRAVENOUS
  Filled 2016-05-12: qty 50

## 2016-05-12 MED ORDER — POTASSIUM CHLORIDE CRYS ER 20 MEQ PO TBCR
40.0000 meq | EXTENDED_RELEASE_TABLET | ORAL | Status: DC
Start: 2016-05-12 — End: 2016-05-12

## 2016-05-12 MED ORDER — DEXTROSE 5 % IV SOLN
2.0000 g | Freq: Two times a day (BID) | INTRAVENOUS | Status: DC
  Administered 2016-05-12 – 2016-05-14 (×5): 2 g via INTRAVENOUS
  Filled 2016-05-12 (×6): qty 2

## 2016-05-12 MED ORDER — ORAL CARE MOUTH RINSE
15.0000 mL | Freq: Two times a day (BID) | OROMUCOSAL | Status: DC
  Administered 2016-05-12 – 2016-05-21 (×12): 15 mL via OROMUCOSAL

## 2016-05-12 MED ORDER — LEVALBUTEROL HCL 0.63 MG/3ML IN NEBU
0.6300 mg | INHALATION_SOLUTION | Freq: Three times a day (TID) | RESPIRATORY_TRACT | Status: DC
Start: 2016-05-13 — End: 2016-05-20
  Administered 2016-05-13 – 2016-05-20 (×23): 0.63 mg via RESPIRATORY_TRACT
  Filled 2016-05-12 (×24): qty 3

## 2016-05-12 NOTE — Progress Notes (Signed)
Remote pacemaker transmission.   

## 2016-05-12 NOTE — Progress Notes (Signed)
Patient was scheduled for lumbar puncture.  Phone consent was obtained by me from patient's daughter.  Patient arrived to radiology suite and despite medication, patient was moving too much to perform LP.  I spoke with Dr. Gwenlyn PerkingMadera and let him know that radiology would perform LP if he could provide moderate sedation supervision or alternatively, schedule with interventional radiologist.  He is covering patient for meningitis/encephalitis and wants to discuss with family before proceeding.

## 2016-05-12 NOTE — Progress Notes (Addendum)
[facility] PROGRESS NOTE  ZAHRAH SUTHERLIN JYN:[id number] DOB: 1945-12-14 DOA: 05/11/2016 PCP: Irving Copas, MD  Inpatient summary and HPI 71 y.o. female with history of CAD status post CABG, diastolic CHF, hypertension, atrial fibrillation not on anticoagulation secondary to severe GI bleed, tachybradycardia syndrome status post pacemaker placement was brought to the ER secondary to confusion.  Assessment/Plan: 1-acute encephalopathy: presumed to be secondary to infection (most likely UTI) vs medication related (patient pn benzo, muscle relaxant and tramadol at home; unsure how much of this medications she has taken). Other concerns includes meningitis (especially with development of fever now. -will minimize meds that can cause changes in mentation. -LP initially planned for today; but due to patient constant jerking movement unable to be performed. Discussed with sister for use of moderate sedation, in order to accomplished test; and the decision was to continue empiric treatment for now and hold on LP.  -continue vanc, rocephin and acyclovir empirically for now -follow blood cx's and urine cx results -TSH and CT scan normal -will check B12 and RPR -currently with sitter at bedside and requiring restrains (due to agitation).  2-Paroxysmal atrial fibrillation -rate controlled  -not on anticoagulation given hx of severe GI bleed -CHADsVASC score 4 -monitor on telemetry   3-hypokalemia and low magnesium  -will replete and follow trend -Mg was also low, will replete as well  4-AKI: -due to dehydration and UTI most likely -will treat UTI -continue IVF's -follow renal function trend  5-hx of diastolic heart failure -stable and compensated -will follow daily weight and strict intake/output  6-hypernatremia -due to dehydration -will follow electrolytes  -providing IVF's   Code Status: DNR Family Communication: no family at bedside; discussed with sister over  the phone. Disposition Plan: remains in stepdown for now; given high need for attention and increase psychological features. Continue IV antibiotics and follow clinical response.   Consultants:  IR for LP  Procedures:  See below for x-ray reports  LP planned for 3/13  Antibiotics:  Vancomycin, rocephin and acyclovir 05/11/16  HPI/Subjective: Low grade fever overnight, still confused and with poor insight; patient also very restless and ended requiring restrains for agitation.  Objective: Vitals:   05/12/16 0600 05/12/16 0738  BP: (!) 143/87   Pulse: (!) 108   Resp: (!) 22   Temp:  99 F (37.2 C)    Intake/Output Summary (Last 24 hours) at 05/12/16 0923 Last data filed at 05/12/16 0837  Gross per 24 hour  Intake           756.25 ml  Output                0 ml  Net           756.25 ml   Filed Weights   05/12/16 0100 05/12/16 0440  Weight: 75.2 kg (165 lb 12.6 oz) 75.2 kg (165 lb 12.6 oz)    Exam:   General:  Orient to name, confused and sleepy; positive low grade fever overnight; no CP, no SOB.  Cardiovascular: mild tachycardia, no rubs, no gallops  Respiratory: good air movement, no wheezing, no crackles  Abdomen: soft, ND, positive BS  Musculoskeletal: no edema, no cyanosis   Data Reviewed: Basic Metabolic Panel:  Recent Labs Lab 05/11/16 2032 05/11/16 2041 05/11/16 2315 05/12/16 0340  NA 146* 147*  --  146*  K 2.7* 2.8*  --  3.0*  CL 106 102  --  107  CO2 28  --   --  32  GLUCOSE 90 85  --  126*  BUN 12 9  --  11  CREATININE 1.19* 1.20*  --  1.11*  CALCIUM 9.5  --   --  8.6*  MG  --   --  1.5*  --    Liver Function Tests:  Recent Labs Lab 05/11/16 2032 05/12/16 0340  AST 46* 42*  ALT 11* 10*  ALKPHOS 64 56  BILITOT 2.5* 2.8*  PROT 6.6 5.7*  ALBUMIN 4.0 3.4*   No results for input(s): LIPASE, AMYLASE in the last 168 hours.  Recent Labs Lab 05/11/16 2032 05/12/16 0731  AMMONIA 41* 31   CBC:  Recent Labs Lab  05/11/16 2032 05/11/16 2041 05/12/16 0340  WBC 11.5*  --  10.4  NEUTROABS 7.2  --   --   HGB 10.1* 10.9* 9.2*  HCT 30.7* 32.0* 27.9*  MCV 98.4  --  97.9  PLT 123*  --  96*   Cardiac Enzymes: No results for input(s): CKTOTAL, CKMB, CKMBINDEX, TROPONINI in the last 168 hours. BNP (last 3 results)  Recent Labs  12/16/15 1544  BNP 751.9*    ProBNP (last 3 results) No results for input(s): PROBNP in the last 8760 hours.  CBG:  Recent Labs Lab 05/12/16 0345 05/12/16 0737  GLUCAP 116* 114*    Recent Results (from the past 240 hour(s))  MRSA PCR Screening     Status: None   Collection Time: 05/12/16 12:24 AM  Result Value Ref Range Status   MRSA by PCR NEGATIVE NEGATIVE Final    Comment:        The GeneXpert MRSA Assay (FDA approved for NASAL specimens only), is one component of a comprehensive MRSA colonization surveillance program. It is not intended to diagnose MRSA infection nor to guide or monitor treatment for MRSA infections.      Studies: Dg Chest 1 View  Result Date: 05/11/2016 CLINICAL DATA:  Dementia/confusion with history of fall EXAM: CHEST 1 VIEW COMPARISON:  12/27/2015 FINDINGS: Post sternotomy changes are again evident. There is a left-sided duo lead pacing device with leads over the right atrium and right ventricle. The lungs are slightly hyperinflated. There is no acute infiltrate or effusion. No pneumothorax. Stable cardiomegaly with central vascular congestion. Aortic atherosclerosis. IMPRESSION: 1. Cardiomegaly with mild central congestion.  No overt edema 2. No acute infiltrate Electronically Signed   By: Jasmine Pang M.D.   On: 05/11/2016 23:39   Dg Lumbar Spine 2-3 Views  Result Date: 05/11/2016 CLINICAL DATA:  Increased confusion with history of fall EXAM: LUMBAR SPINE - 2-3 VIEW COMPARISON:  12/16/2015 FINDINGS: Five non rib-bearing lumbar type vertebra. SI joints are patent. Lumbar alignment stable. Mild superior endplate compression  deformity of L3 minimally progressed. New mild compression deformity of T12 with approximately 25% loss of height anteriorly. Remaining vertebral bodies appear grossly maintained. Mild to moderate narrowing at L2-L3 with mild narrowing at L3-L4, L4-L5 and L5-S1. Dense atherosclerosis of the aorta IMPRESSION: 1. Slight progression of previously noted superior endplate deformity at L3 2. New mild compression deformity of T12 Electronically Signed   By: Jasmine Pang M.D.   On: 05/11/2016 23:37   Dg Pelvis 1-2 Views  Result Date: 05/11/2016 CLINICAL DATA:  Increased confusion with history of fall EXAM: PELVIS - 1-2 VIEW COMPARISON:  12/16/2015 FINDINGS: There is no evidence of pelvic fracture or diastasis. No pelvic bone lesions are seen. Mild arthritis of the bilateral hips. IMPRESSION: No acute osseous abnormality Electronically Signed   By: Selena Batten  Jake SamplesFujinaga M.D.   On: 05/11/2016 23:37   Ct Head Wo Contrast  Result Date: 05/11/2016 CLINICAL DATA:  71 year old female with altered mental status. EXAM: CT HEAD WITHOUT CONTRAST TECHNIQUE: Contiguous axial images were obtained from the base of the skull through the vertex without intravenous contrast. COMPARISON:  None. FINDINGS: Brain: There is mild age-related atrophy and chronic microvascular ischemic changes. There is no acute intracranial hemorrhage. No mass effect or midline shift noted. No intra-axial fluid collection. Vascular: No hyperdense vessel or unexpected calcification. Skull: Normal. Negative for fracture or focal lesion. Sinuses/Orbits: No acute finding. Other: None. IMPRESSION: No acute intracranial hemorrhage. Mild age-related atrophy and chronic microvascular ischemic disease. If symptoms persist and there are no contraindications, MRI may provide better evaluation if clinically indicated. Electronically Signed   By: Elgie CollardArash  Radparvar M.D.   On: 05/11/2016 21:00    Scheduled Meds: . acyclovir  10 mg/kg (Adjusted) Intravenous Q12H  . budesonide  (PULMICORT) nebulizer solution  0.25 mg Nebulization BID  . cefTRIAXone (ROCEPHIN)  IV  2 g Intravenous Q12H  . fluticasone  2 spray Each Nare Daily  . ipratropium-albuterol  3 mL Nebulization TID  . magnesium sulfate 1 - 4 g bolus IVPB  2 g Intravenous Once  . mouth rinse  15 mL Mouth Rinse BID  . metoprolol  2.5 mg Intravenous Q6H  . potassium chloride  40 mEq Oral Q4H  . vancomycin  1,500 mg Intravenous Once  . vancomycin  750 mg Intravenous Q12H   Continuous Infusions: . 0.9 % NaCl with KCl 20 mEq / L 75 mL/hr at 05/12/16 0035    Principal Problem:   Acute encephalopathy Active Problems:   Essential hypertension   PAF (paroxysmal atrial fibrillation) (HCC)   Tachy-brady syndrome (HCC)   Hypokalemia    Time spent: 25 minutes    Vassie LollMadera, Milanya Sunderland  [facility] Pager [phone].  If 7PM-7AM, please contact night-coverage at [email or web address], password [email or web address] 05/12/2016, 9:23 AM  LOS: 0 days      Addendum Called again by nurse secondary to continue agitation. Vitalsstable overall; but patient continue to be confused and very fidgety. Discussed with PCCM for potential use of Precedex for agitation (gievn lack of response to lorazepam pushes and haldol). Advise to try zyprexa and to call again base on response. Will use oral desintegrating tablet  And continue close monitoring.  Vassie LollMadera, Seymour Pavlak [phone]

## 2016-05-12 NOTE — Care Management Obs Status (Signed)
MEDICARE OBSERVATION STATUS NOTIFICATION   Patient Details  Name: Roderic PalauVirginia W Loving MRN: [id number] Date of Birth: 01-30-1946   Medicare Observation Status Notification Given:  Other (see comment) /patient is combative and not orientated to time,place or person/unable to sign, no toher family present.   Golda Acreavis, Rhonda Lynn, RN 05/12/2016, 4:06 PM

## 2016-05-12 NOTE — Telephone Encounter (Signed)
New Message:   Gabrielle HongJudy wants to make sure that Dr [facility]Skains [facility]

## 2016-05-12 NOTE — Progress Notes (Signed)
Pharmacy Antibiotic Note  Sarita HaverVirginia W [name] is a 71 y.o. female admitted on 05/11/2016 with confusion.  Pharmacy has been consulted for acyclovir/vancomycin/ceftriaxone dosing for encephalitis.  Pt with acute encephalopathy - cause not clear.  UA shows possible UTI. MD to get lumbar puncture.  WBC 11.5>10.4, creat 1.2>>1.11, temp 100.2 Wt 75.2 kg, BMI 30.3  Plan:  Ceftriaxone 2 gm IV q12h - meningitis dose Vancomycin 1500 mg IV x 1 loading dose followed by Vancomycin 750 mg IV every 12 hours.  Goal trough 15-20 mcg/mL. Acyclovir 10 mg/kg ABW = 600 mg IV q12h F/u renal fxn, wbc, temp, culture data, daily Screat Vancomycin trough as needed  Height: 5\' 2"  (157.5 cm) Weight: 165 lb 12.6 oz (75.2 kg) IBW/kg (Calculated) : 50.1  Temp (24hrs), Avg:99.6 F (37.6 C), Min:99 F (37.2 C), Max:100.2 F (37.9 C)   Recent Labs Lab 05/11/16 2032 05/11/16 2041 05/12/16 0340  WBC 11.5*  --  10.4  CREATININE 1.19* 1.20* 1.11*    Estimated Creatinine Clearance: 43.5 mL/min (by C-G formula based on SCr of 1.11 mg/dL (H)).    Allergies  Allergen Reactions  . Novocain [Procaine] Hives and Other (See Comments)    palpitations  . Penicillins Hives    Has patient had a PCN reaction causing immediate rash, facial/tongue/throat swelling, SOB or lightheadedness with hypotension: Yes Has patient had a PCN reaction causing severe rash involving mucus membranes or skin necrosis: No Has patient had a PCN reaction that required hospitalization No Has patient had a PCN reaction occurring within the last 10 years: No If all of the above answers are "NO", then may proceed with Cephalosporin use.    Antimicrobials this admission: Doxy 3/13 x 1 dose Acyclovir 3/13>> vanc 3/13>> CTX 3/13>> Dose adjustments this admission:   Microbiology results: 3/13 MRSA neg 3/12 Ucx>>  Thank you for allowing pharmacy to be a part of this patient's care.  Herby AbrahamMichelle T. Zelena Bushong, Pharm.D. [phone] 05/12/2016 7:34  AM

## 2016-05-13 ENCOUNTER — Ambulatory Visit: Payer: Medicare Other | Admitting: Physician Assistant

## 2016-05-13 ENCOUNTER — Encounter: Payer: Self-pay | Admitting: Cardiology

## 2016-05-13 ENCOUNTER — Inpatient Hospital Stay (HOSPITAL_COMMUNITY): Payer: Medicare Other

## 2016-05-13 DIAGNOSIS — Z452 Encounter for adjustment and management of vascular access device: Secondary | ICD-10-CM

## 2016-05-13 LAB — LACTIC ACID, PLASMA
Lactic Acid, Venous: 1.3 mmol/L (ref 0.5–1.9)
Lactic Acid, Venous: 4.4 mmol/L (ref 0.5–1.9)

## 2016-05-13 LAB — BLOOD GAS, ARTERIAL
Acid-Base Excess: 2 mmol/L (ref 0.0–2.0)
Bicarbonate: 26.5 mmol/L (ref 20.0–28.0)
Drawn by: 257701
O2 Content: 4 L/min
O2 Saturation: 97.2 %
Patient temperature: 37
pCO2 arterial: 43.4 mmHg (ref 32.0–48.0)
pH, Arterial: 7.402 (ref 7.350–7.450)
pO2, Arterial: 93 mmHg (ref 83.0–108.0)

## 2016-05-13 LAB — URINE CULTURE

## 2016-05-13 LAB — BASIC METABOLIC PANEL
Anion gap: 9 (ref 5–15)
BUN: 12 mg/dL (ref 6–20)
CO2: 27 mmol/L (ref 22–32)
Calcium: 8.6 mg/dL — ABNORMAL LOW (ref 8.9–10.3)
Chloride: 114 mmol/L — ABNORMAL HIGH (ref 101–111)
Creatinine, Ser: 1.09 mg/dL — ABNORMAL HIGH (ref 0.44–1.00)
GFR calc Af Amer: 57 mL/min — ABNORMAL LOW (ref >60)
GFR calc non Af Amer: 49 mL/min — ABNORMAL LOW (ref >60)
Glucose, Bld: 89 mg/dL (ref 65–99)
Potassium: 3.8 mmol/L (ref 3.5–5.1)
Sodium: 150 mmol/L — ABNORMAL HIGH (ref 135–145)

## 2016-05-13 LAB — CBC
HCT: 27.1 % — ABNORMAL LOW (ref 36.0–46.0)
Hemoglobin: 8.7 g/dL — ABNORMAL LOW (ref 12.0–15.0)
MCH: 31.5 pg (ref 26.0–34.0)
MCHC: 32.1 g/dL (ref 30.0–36.0)
MCV: 98.2 fL (ref 78.0–100.0)
Platelets: 86 10*3/uL — ABNORMAL LOW (ref 150–400)
RBC: 2.76 MIL/uL — ABNORMAL LOW (ref 3.87–5.11)
RDW: 15.9 % — ABNORMAL HIGH (ref 11.5–15.5)
WBC: 10.6 10*3/uL — ABNORMAL HIGH (ref 4.0–10.5)

## 2016-05-13 LAB — GLUCOSE, CAPILLARY
Glucose-Capillary: 86 mg/dL (ref 65–99)
Glucose-Capillary: 117 mg/dL — ABNORMAL HIGH (ref 65–99)
Glucose-Capillary: 108 mg/dL — ABNORMAL HIGH (ref 65–99)
Glucose-Capillary: 132 mg/dL — ABNORMAL HIGH (ref 65–99)
Glucose-Capillary: 135 mg/dL — ABNORMAL HIGH (ref 65–99)

## 2016-05-13 LAB — AMMONIA: Ammonia: 60 umol/L — ABNORMAL HIGH (ref 9–35)

## 2016-05-13 LAB — RPR: RPR Ser Ql: NONREACTIVE

## 2016-05-13 LAB — HIV ANTIBODY (ROUTINE TESTING W REFLEX): HIV Screen 4th Generation wRfx: NONREACTIVE

## 2016-05-13 LAB — MAGNESIUM: Magnesium: 2 mg/dL (ref 1.7–2.4)

## 2016-05-13 LAB — TSH: TSH: 0.644 u[IU]/mL (ref 0.350–4.500)

## 2016-05-13 MED ORDER — SODIUM CHLORIDE 0.9 % IV SOLN
0.0000 ug/min | INTRAVENOUS | Status: DC
  Administered 2016-05-13: 130 ug/min via INTRAVENOUS
  Administered 2016-05-14: 105 ug/min via INTRAVENOUS
  Filled 2016-05-13 (×5): qty 4

## 2016-05-13 MED ORDER — LORAZEPAM BOLUS VIA INFUSION: 0.5000 mg | Freq: Four times a day (QID) | INTRAVENOUS | Status: DC | PRN

## 2016-05-13 MED ORDER — LORAZEPAM 2 MG/ML IJ SOLN
0.5000 mg | Freq: Four times a day (QID) | INTRAMUSCULAR | Status: DC | PRN
  Administered 2016-05-13: 0.5 mg via INTRAVENOUS
  Filled 2016-05-13: qty 1

## 2016-05-13 MED ORDER — THIAMINE HCL 100 MG/ML IJ SOLN
100.0000 mg | Freq: Every day | INTRAMUSCULAR | Status: DC
  Administered 2016-05-13 – 2016-05-15 (×3): 100 mg via INTRAVENOUS
  Filled 2016-05-13 (×3): qty 2

## 2016-05-13 MED ORDER — LORAZEPAM 2 MG/ML IJ SOLN
2.0000 mg | Freq: Once | INTRAMUSCULAR | Status: AC
  Administered 2016-05-13: 2 mg via INTRAVENOUS
  Filled 2016-05-13: qty 1

## 2016-05-13 MED ORDER — LACTULOSE ENEMA
300.0000 mL | Freq: Once | ORAL | Status: DC
  Filled 2016-05-13: qty 300

## 2016-05-13 MED ORDER — DEXMEDETOMIDINE HCL IN NACL 200 MCG/50ML IV SOLN
0.0000 ug/kg/h | INTRAVENOUS | Status: DC
  Administered 2016-05-13: 0.4 ug/kg/h via INTRAVENOUS
  Administered 2016-05-13: 0.7 ug/kg/h via INTRAVENOUS
  Administered 2016-05-13: 0.4 ug/kg/h via INTRAVENOUS
  Filled 2016-05-13 (×4): qty 50

## 2016-05-13 MED ORDER — METOPROLOL TARTRATE 5 MG/5ML IV SOLN
2.5000 mg | INTRAVENOUS | Status: AC
  Administered 2016-05-13: 2.5 mg via INTRAVENOUS

## 2016-05-13 MED ORDER — METOPROLOL TARTRATE 5 MG/5ML IV SOLN
5.0000 mg | Freq: Once | INTRAVENOUS | Status: AC
  Administered 2016-05-13: 5 mg via INTRAVENOUS

## 2016-05-13 MED ORDER — SODIUM CHLORIDE 0.9 % IV BOLUS (SEPSIS)
500.0000 mL | Freq: Once | INTRAVENOUS | Status: AC
  Administered 2016-05-13: 500 mL via INTRAVENOUS

## 2016-05-13 MED ORDER — DEXTROSE 5 % IV SOLN
INTRAVENOUS | Status: DC
  Administered 2016-05-13 – 2016-05-16 (×3): via INTRAVENOUS

## 2016-05-13 MED ORDER — LORAZEPAM 2 MG/ML IJ SOLN
0.5000 mg | Freq: Four times a day (QID) | INTRAMUSCULAR | Status: DC
  Administered 2016-05-14 – 2016-05-15 (×5): 0.5 mg via INTRAVENOUS
  Filled 2016-05-13 (×5): qty 1

## 2016-05-13 MED ORDER — METOPROLOL TARTRATE 5 MG/5ML IV SOLN
2.5000 mg | Freq: Four times a day (QID) | INTRAVENOUS | Status: DC
  Administered 2016-05-14 (×2): 2.5 mg via INTRAVENOUS
  Filled 2016-05-13 (×6): qty 5

## 2016-05-13 MED ORDER — METOPROLOL TARTRATE 5 MG/5ML IV SOLN: 5.0000 mg | Freq: Four times a day (QID) | INTRAVENOUS | Status: DC

## 2016-05-13 MED ORDER — LEVALBUTEROL HCL 0.63 MG/3ML IN NEBU
0.6300 mg | INHALATION_SOLUTION | Freq: Four times a day (QID) | RESPIRATORY_TRACT | Status: DC | PRN
  Administered 2016-05-13: 0.63 mg via RESPIRATORY_TRACT
  Filled 2016-05-13: qty 3

## 2016-05-13 MED ORDER — SODIUM CHLORIDE 0.9 % IV SOLN
0.0000 ug/min | INTRAVENOUS | Status: DC
  Administered 2016-05-13: 20 ug/min via INTRAVENOUS
  Administered 2016-05-13: 150 ug/min via INTRAVENOUS
  Filled 2016-05-13 (×2): qty 1

## 2016-05-13 MED ORDER — HALOPERIDOL LACTATE 5 MG/ML IJ SOLN: 5.0000 mg | Freq: Four times a day (QID) | INTRAMUSCULAR | Status: DC | PRN

## 2016-05-13 MED ORDER — CHLORHEXIDINE GLUCONATE CLOTH 2 % EX PADS
6.0000 | MEDICATED_PAD | Freq: Every day | CUTANEOUS | Status: DC
  Administered 2016-05-14 – 2016-05-18 (×5): 6 via TOPICAL

## 2016-05-13 MED ORDER — METOPROLOL TARTRATE 5 MG/5ML IV SOLN
2.5000 mg | Freq: Once | INTRAVENOUS | Status: AC
  Administered 2016-05-13: 2.5 mg via INTRAVENOUS

## 2016-05-13 MED ORDER — LACTULOSE ENEMA
300.0000 mL | Freq: Two times a day (BID) | ORAL | Status: DC
  Filled 2016-05-13: qty 300

## 2016-05-13 NOTE — Progress Notes (Signed)
eLink Physician-Brief Progress Note Patient Name: Gabrielle PalauVirginia W Fabiano DOB: [birth year] MRN: [id number]   Date of Service  05/13/2016  HPI/Events of Note  Multiple issues: 1. Patient is lethargic and bradycardic - now paced at HR = 60. Currently on a Precedex IV infusion and 2. Request to change status to ICU.   eICU Interventions  Will order: 1. D/C Precedex IV infusion.  2. Hold ALL sedatives and narcotics. 3. Patient status changed to ICU.        Intervention Category Major Interventions: Change in mental status - evaluation and management  Sommer,Steven Eugene 05/13/2016, 8:03 PM

## 2016-05-13 NOTE — Progress Notes (Signed)
Merdis DelayK. Schorr, NP notified of pt having HR increasing to 200s and sustaining. BP 138/108, 96%-6L. Pt remains asleep at this time. Order given for metoprolol 5mg . K. Schorr,NP notified of previous episode of SVT up to the 180s.

## 2016-05-13 NOTE — Progress Notes (Signed)
At 0915 RN was notified of pt heart rate 180s-200.  Pt. Is extremely restless and agitated. MD made aware. RN will continue to monitor.

## 2016-05-13 NOTE — Consult Note (Signed)
NEURO HOSPITALIST CONSULT NOTE   Requestig physician: Dr. Carmell Austria   Reason for Consult: AMS   History obtained from:   Chart    HPI:                                                                                                                                          Gabrielle [name] is an 71 y.o. female with history dementia, CAD, diastolic CHF, hypertension, atrial fibrillation not on anticoagulation secondary to severe GI bleed, tachybradycardia syndrome status post pacemaker who was brought to the ER on 05/12/2016 because of increased confusion as per patient's sister. In the ED UA showed leukocyte esterase positive (was recommended to repeat an culture however I do not see that this has been done at this point) , ammonia 41, potassium 2.8. Early blood cultures are pending. Currently patient is on acyclovir, Rocephin, and vancomycin all per pharmacy consult.   Feeding through the [facility] patient is on significant amount of sedating medications including Xanax, Ultram, and Zanaflex. Since admission these medications have been held.  Admission patient has been afebrile. Blood pressure has been stable with the most recent blood pressure of 123/54, 138/108, 120/70. Her O2 sats have been stable between 90 and 95%.  Unfortunately patient is unable to obtain MRI secondary to pacemaker. EEG has been ordered. Neurology has been asked to evaluate patient  Currently on consultation patient is extremely agitated moving all extremities with good strength in a 3 point restraint. Patient shows no meningismus and no nuchal rigidity. Patient is talking nonsensically follows no commands she does localize sternal rub and withdraw from pain. At this time her heart rate is 185 blood pressures 120/100. She is awaiting lumbar puncture by fluoroscopy however she will need to be sedated for this. As she is so agitated.  Of note patient was on Xanax at home along with multiple other medications.  Per nursing staff patient has animals at home but would not allow any other person inside the house. She was even supposed to get home health and would not allow those people in the house. It is unclear what medications patient has been taking and what medication she has been abusing if at all.  Past Medical History:  Diagnosis Date  . Anemia   . Arthritis   . Asthma   . Atrial fibrillation with controlled ventricular response (HCC) 11/2009   a. not on anticoagulation due to recurrent GI bleeding while on Eliquis.  . Chronic bronchitis   . Chronic diastolic CHF (congestive heart failure) (HCC)    a. Echo 12/2015: EF 55-60% w/ Grade 3 DD  . Chronic headache   . COPD (chronic obstructive pulmonary disease) (HCC)   . Depression   . Heart attack   . Hyperlipidemia   .  Hypertension     Past Surgical History:  Procedure Laterality Date  . CAD-CABG     x7, brief post-op Atrial Fib  . CESAREAN SECTION     x2  . COLONOSCOPY WITH PROPOFOL N/A 10/24/2015   Procedure: COLONOSCOPY WITH PROPOFOL;  Surgeon: Carman Ching, MD;  Location: [facility] ENDOSCOPY;  Service: Endoscopy;  Laterality: N/A;  . CORONARY ARTERY BYPASS GRAFT  2011  . EP IMPLANTABLE DEVICE N/A 12/26/2015   Procedure: Pacemaker Implant;  Surgeon: Marinus Maw, MD;  Location: [facility] INVASIVE CV LAB;  Service: Cardiovascular;  Laterality: N/A;  . ESOPHAGOGASTRODUODENOSCOPY N/A 10/22/2015   Procedure: ESOPHAGOGASTRODUODENOSCOPY (EGD);  Surgeon: Carman Ching, MD;  Location: [facility] ENDOSCOPY;  Service: Endoscopy;  Laterality: N/A;  . ESOPHAGOGASTRODUODENOSCOPY N/A 12/24/2015   Procedure: ESOPHAGOGASTRODUODENOSCOPY (EGD);  Surgeon: Carman Ching, MD;  Location: [facility] ENDOSCOPY;  Service: Endoscopy;  Laterality: N/A;  . JOINT REPLACEMENT Bilateral   . TONSILLECTOMY    . TOTAL KNEE ARTHROPLASTY      Family History  Problem Relation Age of Onset  . Diabetes Mother   . Hypertension Mother   . Heart disease Mother     before age 62  . Heart  disease Father     before age 28  . Hypertension Father   . Heart attack Father   . Lung cancer Maternal Grandfather   . Lung cancer Paternal Grandfather   . Hypertension Daughter   . Stroke Paternal Grandmother       Social History:  reports that she has quit smoking. Her smoking use included Cigarettes. She smoked 0.20 packs per day. She has never used smokeless tobacco. She reports that she does not drink alcohol or use drugs.  Allergies  Allergen Reactions  . Novocain [Procaine] Hives and Palpitations  . Penicillins Hives and Other (See Comments)    Has patient had a PCN reaction causing immediate rash, facial/tongue/throat swelling, SOB or lightheadedness with hypotension: No Has patient had a PCN reaction causing severe rash involving mucus membranes or skin necrosis: No Has patient had a PCN reaction that required hospitalization No Has patient had a PCN reaction occurring within the last 10 years: No If all of the above answers are "NO", then may proceed with Cephalosporin use.    MEDICATIONS:                                                                                                                     Prior to Admission:  Prescriptions Prior to Admission  Medication Sig Dispense Refill Last Dose  . albuterol (PROVENTIL HFA;VENTOLIN HFA) 108 (90 Base) MCG/ACT inhaler Inhale 2 puffs into the lungs every 6 (six) hours as needed for wheezing or shortness of breath.   unknown at unknown  . ALPRAZolam (XANAX) 0.5 MG tablet Take 1 tablet (0.5 mg total) by mouth 2 (two) times daily as needed for anxiety. 10 tablet 0 unknown at unknown  . atorvastatin (LIPITOR) 20 MG tablet Take 20 mg by mouth daily.   unknown at unknown  .  budesonide-formoterol (SYMBICORT) 160-4.5 MCG/ACT inhaler Inhale 2 puffs into the lungs 2 (two) times daily. 10.2 g 6 unknown at unknown  . citalopram (CELEXA) 20 MG tablet Take 20 mg by mouth daily.   unknown at unknown  . clobetasol cream (TEMOVATE) 0.05 %  Apply 1 application topically daily as needed (for itching).   3 unknown at unknown  . docusate sodium (COLACE) 100 MG capsule Take 100 mg by mouth daily as needed for moderate constipation.    unknown at unknown  . fluticasone (FLONASE) 50 MCG/ACT nasal spray Place 2 sprays into both nostrils daily. 16 g 0 unknown at unknown  . furosemide (LASIX) 20 MG tablet Take 4 tablets (80 mg total) by mouth daily. Patient may take on additional one as needed for swelling 30 tablet  unknown at unknown  . guaiFENesin (MUCINEX) 600 MG 12 hr tablet Take 1 tablet (600 mg total) by mouth 2 (two) times daily. 30 tablet 0 unknown at unknown  . losartan (COZAAR) 50 MG tablet Take 0.5 tablets (25 mg total) by mouth daily. 30 tablet 0 unknown at unknown  . metoprolol succinate (TOPROL-XL) 25 MG 24 hr tablet Take 1 tablet (25 mg total) by mouth daily. 90 tablet 3 unknown at unknown  . oxymetazoline (AFRIN) 0.05 % nasal spray Place 2 sprays into both nostrils 2 (two) times daily as needed for congestion.    unknown at unknown  . pantoprazole (PROTONIX) 40 MG tablet Take 40 mg by mouth daily before breakfast.    unknown at unknown  . Polyethyl Glycol-Propyl Glycol (SYSTANE ULTRA) 0.4-0.3 % SOLN Place 1 drop into both eyes daily as needed (for dry eyes).    unknown at unknown  . polyethylene glycol (MIRALAX / GLYCOLAX) packet Take 17 g by mouth daily as needed for mild constipation. 14 each 0 unknown at unknown  . ranitidine (ZANTAC) 150 MG tablet Take 150 mg by mouth 2 (two) times daily.   unknown at unknown  . sucralfate (CARAFATE) 1 GM/10ML suspension Take 10 mLs (1 g total) by mouth 4 (four) times daily -  with meals and at bedtime. 420 mL 2 unknown at unknown  . tiZANidine (ZANAFLEX) 4 MG tablet Take 4 mg by mouth at bedtime as needed for muscle spasms.   unknown at unknown  . traMADol (ULTRAM) 50 MG tablet Take 1 tablet (50 mg total) by mouth every 6 (six) hours as needed for moderate pain. 15 tablet 0 unknown at unknown    Scheduled: . acyclovir  10 mg/kg (Adjusted) Intravenous Q12H  . budesonide (PULMICORT) nebulizer solution  0.5 mg Nebulization BID  . cefTRIAXone (ROCEPHIN)  IV  2 g Intravenous Q12H  . fluticasone  2 spray Each Nare Daily  . ipratropium  0.5 mg Nebulization TID  . levalbuterol  0.63 mg Nebulization TID  . mouth rinse  15 mL Mouth Rinse BID  . metoprolol  2.5 mg Intravenous Q6H  . OLANZapine zydis  5 mg Oral QHS  . vancomycin  750 mg Intravenous Q12H     ROS:  History obtained from unobtainable from patient due to mental status    Blood pressure 120/70, pulse (!) 110, temperature 99.6 F (37.6 C), temperature source Axillary, resp. rate (!) 24, height 5\' 2"  (1.575 m), weight 170 lb 10.2 oz (77.4 kg), SpO2 99 %.   Neurologic Examination:                                                                                                      HEENT-  Normocephalic, no lesions, without obvious abnormality.  Normal external eye and conjunctiva.  Normal TM's bilaterally.  Normal auditory canals and external ears. Normal external nose, mucus membranes and septum.  Normal pharynx. Cardiovascular- regular rate and rhythm, S1, S2 normal, no murmur, click, rub or gallop, pulses palpable throughout   Lungs- no tachypnea, retractions or cyanosis, Heart exam - S1, S2 normal, no murmur, no gallop, rate regular Abdomen- normal findings: bowel sounds normal Extremities- no edema Lymph-no adenopathy palpable Musculoskeletal-no joint tenderness, deformity or swelling Skin-warm and dry, no hyperpigmentation, vitiligo, or suspicious lesions  Neurological Examination Mental Status: Patient is extremely agitated moving all extremities writhing in the bed withdraws from pain but follows no commands. She does blink to threat bilaterally but is continuously talking  nonsensically. Cranial Nerves: II: Blinks to threat bilaterally, pupils equal, round, reactive to light and accommodation III,IV, VI: ptosis not present, extra-ocular motions intact bilaterally V,VII: Face symmetric, facial light touch sensation normal bilaterally VIII: hearing normal bilaterally IX,X: uvula rises symmetrically XI: bilateral shoulder shrug XII: midline tongue extension Motor: Right : Upper extremity   5/5    Left:     Upper extremity   5/5  Lower extremity   5/5     Lower extremity   5/5 Patient is writhing in bed moving all extremities. Tone and bulk:normal tone throughout; no atrophy noted Sensory: Pinprick and light touch intact throughout, bilaterally Deep Tendon Reflexes: 2+ and symmetric throughout Plantars: Right: downgoing   Left: downgoing Cerebellar:  Unable to obtain Gait: Unable to obtain     Lab Results: Basic Metabolic Panel:  Recent Labs Lab 05/11/16 2032 05/11/16 2041 05/11/16 2315 05/12/16 0340 05/13/16 0322  NA 146* 147*  --  146* 150*  K 2.7* 2.8*  --  3.0* 3.8  CL 106 102  --  107 114*  CO2 28  --   --  32 27  GLUCOSE 90 85  --  126* 89  BUN 12 9  --  11 12  CREATININE 1.19* 1.20*  --  1.11* 1.09*  CALCIUM 9.5  --   --  8.6* 8.6*  MG  --   --  1.5*  --  2.0    Liver Function Tests:  Recent Labs Lab 05/11/16 2032 05/12/16 0340  AST 46* 42*  ALT 11* 10*  ALKPHOS 64 56  BILITOT 2.5* 2.8*  PROT 6.6 5.7*  ALBUMIN 4.0 3.4*   No results for input(s): LIPASE, AMYLASE in the last 168 hours.  Recent Labs Lab 05/11/16 2032 05/12/16 0731  AMMONIA 41* 31    CBC:  Recent Labs Lab  05/11/16 2032 05/11/16 2041 05/12/16 0340 05/13/16 0322  WBC 11.5*  --  10.4 10.6*  NEUTROABS 7.2  --   --   --   HGB 10.1* 10.9* 9.2* 8.7*  HCT 30.7* 32.0* 27.9* 27.1*  MCV 98.4  --  97.9 98.2  PLT 123*  --  96* 86*    Cardiac Enzymes: No results for input(s): CKTOTAL, CKMB, CKMBINDEX, TROPONINI in the last 168 hours.  Lipid  Panel: No results for input(s): CHOL, TRIG, HDL, CHOLHDL, VLDL, LDLCALC in the last 168 hours.  CBG:  Recent Labs Lab 05/12/16 1233 05/12/16 1652 05/12/16 2022 05/13/16 0000 05/13/16 0333  GLUCAP 117* 155* 116* 141* 86    Microbiology: Results for orders placed or performed during the hospital encounter of 05/11/16  Urine culture     Status: Abnormal   Collection Time: 05/11/16  8:25 PM  Result Value Ref Range Status   Specimen Description URINE, RANDOM  Final   Special Requests NONE  Final   Culture MULTIPLE SPECIES PRESENT, SUGGEST RECOLLECTION (A)  Final   Report Status 05/13/2016 FINAL  Final  MRSA PCR Screening     Status: None   Collection Time: 05/12/16 12:24 AM  Result Value Ref Range Status   MRSA by PCR NEGATIVE NEGATIVE Final    Comment:        The GeneXpert MRSA Assay (FDA approved for NASAL specimens only), is one component of a comprehensive MRSA colonization surveillance program. It is not intended to diagnose MRSA infection nor to guide or monitor treatment for MRSA infections.     Coagulation Studies: No results for input(s): LABPROT, INR in the last 72 hours.  Imaging: Dg Chest 1 View  Result Date: 05/11/2016 CLINICAL DATA:  Dementia/confusion with history of fall EXAM: CHEST 1 VIEW COMPARISON:  12/27/2015 FINDINGS: Post sternotomy changes are again evident. There is a left-sided duo lead pacing device with leads over the right atrium and right ventricle. The lungs are slightly hyperinflated. There is no acute infiltrate or effusion. No pneumothorax. Stable cardiomegaly with central vascular congestion. Aortic atherosclerosis. IMPRESSION: 1. Cardiomegaly with mild central congestion.  No overt edema 2. No acute infiltrate Electronically Signed   By: Jasmine Pang M.D.   On: 05/11/2016 23:39   Dg Lumbar Spine 2-3 Views  Result Date: 05/11/2016 CLINICAL DATA:  Increased confusion with history of fall EXAM: LUMBAR SPINE - 2-3 VIEW COMPARISON:   12/16/2015 FINDINGS: Five non rib-bearing lumbar type vertebra. SI joints are patent. Lumbar alignment stable. Mild superior endplate compression deformity of L3 minimally progressed. New mild compression deformity of T12 with approximately 25% loss of height anteriorly. Remaining vertebral bodies appear grossly maintained. Mild to moderate narrowing at L2-L3 with mild narrowing at L3-L4, L4-L5 and L5-S1. Dense atherosclerosis of the aorta IMPRESSION: 1. Slight progression of previously noted superior endplate deformity at L3 2. New mild compression deformity of T12 Electronically Signed   By: Jasmine Pang M.D.   On: 05/11/2016 23:37   Dg Pelvis 1-2 Views  Result Date: 05/11/2016 CLINICAL DATA:  Increased confusion with history of fall EXAM: PELVIS - 1-2 VIEW COMPARISON:  12/16/2015 FINDINGS: There is no evidence of pelvic fracture or diastasis. No pelvic bone lesions are seen. Mild arthritis of the bilateral hips. IMPRESSION: No acute osseous abnormality Electronically Signed   By: Jasmine Pang M.D.   On: 05/11/2016 23:37   Ct Head Wo Contrast  Result Date: 05/11/2016 CLINICAL DATA:  71 year old female with altered mental status. EXAM: CT HEAD WITHOUT  CONTRAST TECHNIQUE: Contiguous axial images were obtained from the base of the skull through the vertex without intravenous contrast. COMPARISON:  None. FINDINGS: Brain: There is mild age-related atrophy and chronic microvascular ischemic changes. There is no acute intracranial hemorrhage. No mass effect or midline shift noted. No intra-axial fluid collection. Vascular: No hyperdense vessel or unexpected calcification. Skull: Normal. Negative for fracture or focal lesion. Sinuses/Orbits: No acute finding. Other: None. IMPRESSION: No acute intracranial hemorrhage. Mild age-related atrophy and chronic microvascular ischemic disease. If symptoms persist and there are no contraindications, MRI may provide better evaluation if clinically indicated. Electronically  Signed   By: Elgie Collard M.D.   On: 05/11/2016 21:00       Assessment and plan per attending neurologist  Gabrielle Morn PA-C [facility] [phone]  05/13/2016, 9:13 AM   Assessment/Plan:  This is a 71 year old female presenting with altered mental status and significant agitation. At current time do not believe the current situation represents meningitis given the clinical picture. Etiology of her confusion is unknown at this time as there is a significant amount of history which is not clear. Patient at this time is too agitated to have a lumbar puncture. She is also too agitated to have an EEG. Clinical picture also does not appear to be seizure. Once patient is more stable would consider EEG and if needed at that time LP. That said patient showed no elevated fever, meningismus, or other signs of significant meningitis.  Would highly recommend once patient is stable to transfer patient over to [facility] where neurology, ID, internal medicine can follow patient on a daily basis.  Neurology will follow patient over at [facility].  Addendum: Please note that I examined this patient earlier today and have read the above note and concur with the plan formulated. I understand that the patient cannot be transferred however.  It is my impression that she is not seizing and that she is extremely unlikely to have meningitis and at this time would not recommend LP.  I obtained further hx and the patient has been having paranoid thoughts and aberrant behavior for over 6 months; she does not let the family into her home and it is unclear if she is taking her medicines properly.  AT admission she has been agitated and combative and occasionally hallucinates.  I agree with primary team that her current presentation is the result of UTI or other infectious process superimposed on her likely dementing illness.

## 2016-05-13 NOTE — Progress Notes (Signed)
CRITICAL VALUE ALERT  Critical value received:  Lactic acid 4.4  Date of notification:  05/13/2016  Time of notification:  1230  Critical value read back:Yes.    Nurse who received alert:  Feliz Beamtasheena Jaydence Vanyo rn   MD notified (1st page):  ccmd [id number]  Time of first page:  1235  MD notified (2nd page):  Time of second page:  Responding MD:  ccmd   Time MD responded:  1235

## 2016-05-13 NOTE — Consult Note (Addendum)
PULMONARY / CRITICAL CARE MEDICINE   Name: Gabrielle [name] MRN: [id number] DOB: [birth year]    ADMISSION DATE:  05/11/2016 CONSULTATION DATE:  3/14  REFERRING MD:  Sunnie Nielsen   CHIEF COMPLAINT:  Agitated delirium   HISTORY OF PRESENT ILLNESS:   This is a 71 year old female w/ sig h/o dementia, CAF & tachybrady syndrome. At baseline her functional status is???. Presented to the ER on 3/12 after she was found by her sister to have about a 1 week h/o progressive confusion. Dx eval in ER was c/w possible UTI and she was admitted w/ working dx of UTI and sepsis. On 3/13 was very agitated and restless. Required restraints and sedating medications. In the am of 3/14 the agitation continued and she developed worsening restlessness, PAF w/ HR in 200s.   PAST MEDICAL HISTORY :  She  has a past medical history of Anemia; Arthritis; Asthma; Atrial fibrillation with controlled ventricular response (HCC) (11/2009); Chronic bronchitis; Chronic diastolic CHF (congestive heart failure) (HCC); Chronic headache; COPD (chronic obstructive pulmonary disease) (HCC); Depression; Heart attack; Hyperlipidemia; and Hypertension.  PAST SURGICAL HISTORY: She  has a past surgical history that includes Cesarean section; Total knee arthroplasty; Coronary artery bypass graft (2011); CAD-CABG; Joint replacement (Bilateral); Tonsillectomy; Esophagogastroduodenoscopy (N/A, 10/22/2015); Colonoscopy with propofol (N/A, 10/24/2015); Esophagogastroduodenoscopy (N/A, 12/24/2015); and Cardiac catheterization (N/A, 12/26/2015).  Allergies  Allergen Reactions  . Novocain [Procaine] Hives and Palpitations  . Penicillins Hives and Other (See Comments)    Has patient had a PCN reaction causing immediate rash, facial/tongue/throat swelling, SOB or lightheadedness with hypotension: No Has patient had a PCN reaction causing severe rash involving mucus membranes or skin necrosis: No Has patient had a PCN reaction that required  hospitalization No Has patient had a PCN reaction occurring within the last 10 years: No If all of the above answers are "NO", then may proceed with Cephalosporin use.    No current facility-administered medications on file prior to encounter.    Current Outpatient Prescriptions on File Prior to Encounter  Medication Sig  . albuterol (PROVENTIL HFA;VENTOLIN HFA) 108 (90 Base) MCG/ACT inhaler Inhale 2 puffs into the lungs every 6 (six) hours as needed for wheezing or shortness of breath.  . ALPRAZolam (XANAX) 0.5 MG tablet Take 1 tablet (0.5 mg total) by mouth 2 (two) times daily as needed for anxiety.  Marland Kitchen atorvastatin (LIPITOR) 20 MG tablet Take 20 mg by mouth daily.  . budesonide-formoterol (SYMBICORT) 160-4.5 MCG/ACT inhaler Inhale 2 puffs into the lungs 2 (two) times daily.  . clobetasol cream (TEMOVATE) 0.05 % Apply 1 application topically daily as needed (for itching).   Marland Kitchen docusate sodium (COLACE) 100 MG capsule Take 100 mg by mouth daily as needed for moderate constipation.   . fluticasone (FLONASE) 50 MCG/ACT nasal spray Place 2 sprays into both nostrils daily.  . furosemide (LASIX) 20 MG tablet Take 4 tablets (80 mg total) by mouth daily. Patient may take on additional one as needed for swelling  . guaiFENesin (MUCINEX) 600 MG 12 hr tablet Take 1 tablet (600 mg total) by mouth 2 (two) times daily.  Marland Kitchen losartan (COZAAR) 50 MG tablet Take 0.5 tablets (25 mg total) by mouth daily.  . metoprolol succinate (TOPROL-XL) 25 MG 24 hr tablet Take 1 tablet (25 mg total) by mouth daily.  Marland Kitchen oxymetazoline (AFRIN) 0.05 % nasal spray Place 2 sprays into both nostrils 2 (two) times daily as needed for congestion.   . pantoprazole (PROTONIX) 40 MG tablet Take 40 mg  by mouth daily before breakfast.   . Polyethyl Glycol-Propyl Glycol (SYSTANE ULTRA) 0.4-0.3 % SOLN Place 1 drop into both eyes daily as needed (for dry eyes).   . polyethylene glycol (MIRALAX / GLYCOLAX) packet Take 17 g by mouth daily as  needed for mild constipation.  . ranitidine (ZANTAC) 150 MG tablet Take 150 mg by mouth 2 (two) times daily.  . sucralfate (CARAFATE) 1 GM/10ML suspension Take 10 mLs (1 g total) by mouth 4 (four) times daily -  with meals and at bedtime.  Marland Kitchen tiZANidine (ZANAFLEX) 4 MG tablet Take 4 mg by mouth at bedtime as needed for muscle spasms.  . traMADol (ULTRAM) 50 MG tablet Take 1 tablet (50 mg total) by mouth every 6 (six) hours as needed for moderate pain.    FAMILY HISTORY:  Her indicated that her mother is deceased. She indicated that her father is deceased. She indicated that the status of her maternal grandfather is unknown. She indicated that the status of her paternal grandmother is unknown. She indicated that the status of her paternal grandfather is unknown. She indicated that the status of her daughter is unknown.    SOCIAL HISTORY: She  reports that she has quit smoking. Her smoking use included Cigarettes. She smoked 0.20 packs per day. She has never used smokeless tobacco. She reports that she does not drink alcohol or use drugs.  REVIEW OF SYSTEMS:   Unable   SUBJECTIVE:  Restless and agitated  VITAL SIGNS: BP (!) 85/41 (BP Location: Right Arm) Comment: MD MADE AWARE   Pulse 82   Temp 99.6 F (37.6 C) (Axillary)   Resp 20   Ht 5\' 2"  (1.575 m)   Wt 170 lb 10.2 oz (77.4 kg)   LMP  (LMP Unknown)   SpO2 97%   BMI 31.21 kg/m   HEMODYNAMICS:    VENTILATOR SETTINGS:    INTAKE / OUTPUT: I/O last 3 completed shifts: In: 3118.3 [I.V.:2206.3; IV Piggyback:912] Out: 25 [Urine:25]  PHYSICAL EXAMINATION: General:  Restless, agitated. Tremulous and flailing around in bed Neuro:  Awake, moves all extremities, tremulous, verbal but not interactive and will not f/c HEENT:  NCAT. No JVD Cardiovascular:  Tachy irreg. AF on tele w/ RVR Lungs:  RR in 30s. Crackles both bases  Abdomen:  Soft, not tender. + bowel sounds no OM Musculoskeletal:  Equal st and bulk Skin:  Scattered  area of ecchymosis   LABS:  BMET  Recent Labs Lab 05/11/16 2032 05/11/16 2041 05/12/16 0340 05/13/16 0322  NA 146* 147* 146* 150*  K 2.7* 2.8* 3.0* 3.8  CL 106 102 107 114*  CO2 28  --  32 27  BUN 12 9 11 12   CREATININE 1.19* 1.20* 1.11* 1.09*  GLUCOSE 90 85 126* 89    Electrolytes  Recent Labs Lab 05/11/16 2032 05/11/16 2315 05/12/16 0340 05/13/16 0322  CALCIUM 9.5  --  8.6* 8.6*  MG  --  1.5*  --  2.0    CBC  Recent Labs Lab 05/11/16 2032 05/11/16 2041 05/12/16 0340 05/13/16 0322  WBC 11.5*  --  10.4 10.6*  HGB 10.1* 10.9* 9.2* 8.7*  HCT 30.7* 32.0* 27.9* 27.1*  PLT 123*  --  96* 86*    Coag's No results for input(s): APTT, INR in the last 168 hours.  Sepsis Markers  Recent Labs Lab 05/13/16 0906  LATICACIDVEN 1.3    ABG  Recent Labs Lab 05/11/16 2214  PHART 7.443  PCO2ART 47.6  PO2ART BELOW REPORTABLE  RANGE    Liver Enzymes  Recent Labs Lab 05/11/16 2032 05/12/16 0340  AST 46* 42*  ALT 11* 10*  ALKPHOS 64 56  BILITOT 2.5* 2.8*  ALBUMIN 4.0 3.4*    Cardiac Enzymes No results for input(s): TROPONINI, PROBNP in the last 168 hours.  Glucose  Recent Labs Lab 05/12/16 0737 05/12/16 1233 05/12/16 1652 05/12/16 2022 05/13/16 0000 05/13/16 0333  GLUCAP 114* 117* 155* 116* 141* 86    Imaging Dg Chest Port 1 View  Result Date: 05/13/2016 CLINICAL DATA:  Hypoxia over night, episode of supraventricular tachycardia. Hypotension. EXAM: PORTABLE CHEST 1 VIEW COMPARISON:  05/11/2016. FINDINGS: Trachea is midline. Heart is enlarged. Pacemaker lead tips project over the right atrium and right ventricle. Minimal linear atelectasis or scarring in the lingula. Lungs are otherwise clear. No pleural fluid. IMPRESSION: No acute findings. Electronically Signed   By: Leanna Battles M.D.   On: 05/13/2016 09:33     STUDIES:  LP 3/14>>> abd [place] 3/14>>>1. Liver has an echogenic echotexture with irregular contour. Findings suggest  cirrhosis. No focal hepatic abnormality identified. 2. Dilated common bile duct at 9.6 mm. No gallstones identified. MRCP can be obtained to further evaluate CT head 3/12: neg for bleed CULTURES: UC 3/12: mult species  BC 3/13>>> UC 3/14>>>  ANTIBIOTICS: Acyclovir 3/13>>> Rocephin 3/13>>> vanc 3/13>>>  SIGNIFICANT EVENTS:   LINES/TUBES:     ASSESSMENT / PLAN:  Acute encephalopathy. Suspect that this is a mix of infection and either drug toxicity OR withdrawal. Given h/o taking too much or too little medications I worry about serotonin syndrome OR possible benzo w/d. I do not think that this is meningitis OR seizure. Has what sounds like early dementia at baseline which migh make her more at risk for acute delirium.  Plan:   Start precedex gtt Add very low dose scheduled lorazepam (0.5mg  q6) w/ hold orders for sedation Add PRN ativan For seizure activity RASS goal: 0--1  Repeat 12 lead after she is more calm. IF Qtc still prolonged would need bicarb gtt Cont tele Will defer EEG, imaging and LP to neuro although I think that this is more likely mediation/metabolic  Correct water imbalance  AF w/ RVR. Has known h/o tachy/brady syndrome, CAD and Grade 3 diastolic HF.  ->the longer she is in such a rapid rhythm the higher her risk of acute on chronic diastolic HR.  Plan:   Cont tele Precedex at times will result in bradycardia Once more calm can also start CCB If gets hypotensive w/ precedex can start neo gtt.  May need central line Not an AC candidate d/t fall risk and prior GIB  SIRS/sepsis. Presume urinary tract source.  Plan Repeat UC F/u BC Cont abx   AKI. Scr improved.  Plan Cont IV hydration F/u renal US Renal dose meds.   Hypernatremia and hyperchloremia. At risk for NAG acidosis. Plan Agree w/ free water replacement w/ D5W (will decrease from 100 to 55 to avoid over-rapidly correcting) Repeat chem later this afternoon & in am  Aspiration  risk Plan NPO O2 as needed Check PCXR in am   Anemia of critical illness.  No evidence of bleeding. Suspect hgb drop reflects volume resuscitation  Plan Trend CBC Add PAS  No [place] heparin w/ pending LP   Discission Acutely agitated. I think that this is a mix of infection/UTI and either drug w/d (benzos) or toxicity ? Such as mild degree of serotonin syndrome. I do not think that this is seizure OR  meningitis. Treatment is supportive. Will add precedex, cont IV hydration, tele and supportive care. My hope is if  She can be more calm we may get her HR more stable. Will also add very low dose lorazepam and PRN dosing for seizure. Will defer imaging and neuro-diagnostics to neuro team. Will also need to repeat 12 lead once she is more calm. May need bicarb if still has QTc prolongation.   FAMILY  - Updates: sister updated on plan of care   My critical care time 60 minutes  Simonne MartinetPeter E Babcock ACNP-BC [facility] Pager # [phone] OR # [phone] if no answer   05/13/2016, 11:19 AM

## 2016-05-13 NOTE — Procedures (Signed)
Central Venous Catheter Insertion Procedure Note Gabrielle PalauVirginia W [name] [id number] [birth year]  Procedure: Insertion of Central Venous Catheter Indications: Assessment of intravascular volume, Drug and/or fluid administration and Frequent blood sampling  Procedure Details Consent: Risks of procedure as well as the alternatives and risks of each were explained to the (patient/caregiver).  Consent for procedure obtained. Time Out: Verified patient identification, verified procedure, site/side was marked, verified correct patient position, special equipment/implants available, medications/allergies/relevent history reviewed, required imaging and test results available.  Performed Real time US was used to ID and cannulate  Maximum sterile technique was used including antiseptics, cap, gloves, gown, hand hygiene, mask and sheet. Skin prep: Chlorhexidine; local anesthetic administered A antimicrobial bonded/coated triple lumen catheter was placed in the right internal jugular vein using the Seldinger technique.  Evaluation Blood flow good Complications: No apparent complications Patient did tolerate procedure well. Chest X-ray ordered to verify placement.  CXR: pending.   Gabrielle [name] Gabrielle [name] 05/13/2016, 2:52 PM

## 2016-05-13 NOTE — Telephone Encounter (Signed)
noted 

## 2016-05-13 NOTE — Consult Note (Addendum)
Reason for Consult:   Tacycardia  Requesting Physician: Dr Sunnie Nielsen Primary Cardiologist Dr Anne Fu  HPI:   71 y/o female followed by Dr Anne Fu with a history of CAD, s/p CABG x 7 in 2010, PAF with SSS, s/p BS PTVDP 12/26/15,  GI bleeding- CHADs VASc=4 but not anticoagulated, HTN, and HLD. She had been living alone. She last saw Dr Anne Fu 04/28/16. His note states she was told to resume her Eliquis after Dr Anne Fu spoke with Dr Randa Evens. It was recommended she increase her Lasix to 80 mg daily from 40 mg.   The pt was brought to the ED 05/11/16 by the police after the family called concerned about increasing confusion and paranoid behavior. The pt was found walking around the house naked speaking gibberish. The family reports increasing confusion last 8 months. On admission her K+ was 2.7, BUN 12- SCr 1.1. She is suspected to have a UTI and is on antibiotics as well as antiviral therapy for possible viral meningitis. A spinal tap was unable to be done secondary to pt not able to follow instructions. The pt has been tachycardic since admission. Her initial telemetry strips appear to show AF with VR 108. Today she is tachycardia with a rate of 150-180. She is totally confused, combative, and flailing on the bed.   PMHx:  Past Medical History:  Diagnosis Date  . Anemia   . Arthritis   . Asthma   . Atrial fibrillation with controlled ventricular response (HCC) 11/2009   a. not on anticoagulation due to recurrent GI bleeding while on Eliquis.  . Chronic bronchitis   . Chronic diastolic CHF (congestive heart failure) (HCC)    a. Echo 12/2015: EF 55-60% w/ Grade 3 DD  . Chronic headache   . COPD (chronic obstructive pulmonary disease) (HCC)   . Depression   . Heart attack   . Hyperlipidemia   . Hypertension     Past Surgical History:  Procedure Laterality Date  . CAD-CABG     x7, brief post-op Atrial Fib  . CESAREAN SECTION     x2  . COLONOSCOPY WITH PROPOFOL N/A 10/24/2015     Procedure: COLONOSCOPY WITH PROPOFOL;  Surgeon: Carman Ching, MD;  Location: [facility] ENDOSCOPY;  Service: Endoscopy;  Laterality: N/A;  . CORONARY ARTERY BYPASS GRAFT  2011  . EP IMPLANTABLE DEVICE N/A 12/26/2015   Procedure: Pacemaker Implant;  Surgeon: Marinus Maw, MD;  Location: [facility] INVASIVE CV LAB;  Service: Cardiovascular;  Laterality: N/A;  . ESOPHAGOGASTRODUODENOSCOPY N/A 10/22/2015   Procedure: ESOPHAGOGASTRODUODENOSCOPY (EGD);  Surgeon: Carman Ching, MD;  Location: [facility] ENDOSCOPY;  Service: Endoscopy;  Laterality: N/A;  . ESOPHAGOGASTRODUODENOSCOPY N/A 12/24/2015   Procedure: ESOPHAGOGASTRODUODENOSCOPY (EGD);  Surgeon: Carman Ching, MD;  Location: [facility] ENDOSCOPY;  Service: Endoscopy;  Laterality: N/A;  . JOINT REPLACEMENT Bilateral   . TONSILLECTOMY    . TOTAL KNEE ARTHROPLASTY      SOCHx: She smoked 0.20 packs per day. Recently quit per family  FAMHx: Family History  Problem Relation Age of Onset  . Diabetes Mother   . Hypertension Mother   . Heart disease Mother     before age 81  . Heart disease Father     before age 31  . Hypertension Father   . Heart attack Father   . Lung cancer Maternal Grandfather   . Lung cancer Paternal Grandfather   . Hypertension Daughter   . Stroke Paternal Grandmother     ALLERGIES: Allergies  Allergen Reactions  . Novocain [Procaine] Hives and Palpitations  . Penicillins Hives and Other (See Comments)    Has patient had a PCN reaction causing immediate rash, facial/tongue/throat swelling, SOB or lightheadedness with hypotension: No Has patient had a PCN reaction causing severe rash involving mucus membranes or skin necrosis: No Has patient had a PCN reaction that required hospitalization No Has patient had a PCN reaction occurring within the last 10 years: No If all of the above answers are "NO", then may proceed with Cephalosporin use.    ROS: Review of Systems: Pt not able to give any history   HOME MEDICATIONS: Prior to  Admission medications   Medication Sig Start Date End Date Taking? Authorizing Provider  albuterol (PROVENTIL HFA;VENTOLIN HFA) 108 (90 Base) MCG/ACT inhaler Inhale 2 puffs into the lungs every 6 (six) hours as needed for wheezing or shortness of breath.   Yes Historical Provider, MD  ALPRAZolam Prudy Feeler) 0.5 MG tablet Take 1 tablet (0.5 mg total) by mouth 2 (two) times daily as needed for anxiety. 10/30/15  Yes Clanford Cyndie Mull, MD  atorvastatin (LIPITOR) 20 MG tablet Take 20 mg by mouth daily.   Yes Historical Provider, MD  budesonide-formoterol (SYMBICORT) 160-4.5 MCG/ACT inhaler Inhale 2 puffs into the lungs 2 (two) times daily. 01/15/14  Yes Storm Frisk, MD  citalopram (CELEXA) 20 MG tablet Take 20 mg by mouth daily.   Yes Historical Provider, MD  clobetasol cream (TEMOVATE) 0.05 % Apply 1 application topically daily as needed (for itching).    Yes Historical Provider, MD  docusate sodium (COLACE) 100 MG capsule Take 100 mg by mouth daily as needed for moderate constipation.    Yes Historical Provider, MD  fluticasone (FLONASE) 50 MCG/ACT nasal spray Place 2 sprays into both nostrils daily. 12/21/15  Yes Albertine Grates, MD  furosemide (LASIX) 20 MG tablet Take 4 tablets (80 mg total) by mouth daily. Patient may take on additional one as needed for swelling 04/28/16  Yes Jake Bathe, MD  guaiFENesin (MUCINEX) 600 MG 12 hr tablet Take 1 tablet (600 mg total) by mouth 2 (two) times daily. 12/20/15  Yes Albertine Grates, MD  losartan (COZAAR) 50 MG tablet Take 0.5 tablets (25 mg total) by mouth daily. 12/28/15  Yes Elease Etienne, MD  metoprolol succinate (TOPROL-XL) 25 MG 24 hr tablet Take 1 tablet (25 mg total) by mouth daily. 01/16/16  Yes Jake Bathe, MD  oxymetazoline (AFRIN) 0.05 % nasal spray Place 2 sprays into both nostrils 2 (two) times daily as needed for congestion.    Yes Historical Provider, MD  pantoprazole (PROTONIX) 40 MG tablet Take 40 mg by mouth daily before breakfast.    Yes Historical  Provider, MD  Polyethyl Glycol-Propyl Glycol (SYSTANE ULTRA) 0.4-0.3 % SOLN Place 1 drop into both eyes daily as needed (for dry eyes).    Yes Historical Provider, MD  polyethylene glycol (MIRALAX / GLYCOLAX) packet Take 17 g by mouth daily as needed for mild constipation. 10/30/15  Yes Clanford Cyndie Mull, MD  ranitidine (ZANTAC) 150 MG tablet Take 150 mg by mouth 2 (two) times daily.   Yes Historical Provider, MD  sucralfate (CARAFATE) 1 GM/10ML suspension Take 10 mLs (1 g total) by mouth 4 (four) times daily -  with meals and at bedtime. 12/28/15  Yes Elease Etienne, MD  tiZANidine (ZANAFLEX) 4 MG tablet Take 4 mg by mouth at bedtime as needed for muscle spasms.   Yes Historical Provider, MD  traMADol (  ULTRAM) 50 MG tablet Take 1 tablet (50 mg total) by mouth every 6 (six) hours as needed for moderate pain. 10/30/15  Yes Clanford Cyndie MullL Johnson, MD    HOSPITAL MEDICATIONS: I have reviewed the patient's current medications.  VITALS: Blood pressure (!) 85/41, pulse 82, temperature 99.6 F (37.6 C), temperature source Axillary, resp. rate 20, height 5\' 2"  (1.575 m), weight 170 lb 10.2 oz (77.4 kg), SpO2 97 %.  PHYSICAL EXAM: General appearance: combative, moderately obese and uncooperative Lungs: decreased but barley able to ascultate Heart: irregularly irregular rhythm Abdomen: not distended Extremities: tarce edema Skin: pale, cool, dry Neurologic: Grossly normal  LABS: Results for orders placed or performed during the hospital encounter of 05/11/16 (from the past 24 hour(s))  Urine rapid drug screen (hosp performed)     Status: Abnormal   Collection Time: 05/12/16 10:50 AM  Result Value Ref Range   Opiates POSITIVE (A) NONE DETECTED   Cocaine NONE DETECTED NONE DETECTED   Benzodiazepines NONE DETECTED NONE DETECTED   Amphetamines NONE DETECTED NONE DETECTED   Tetrahydrocannabinol NONE DETECTED NONE DETECTED   Barbiturates NONE DETECTED NONE DETECTED  Glucose, capillary     Status:  Abnormal   Collection Time: 05/12/16 12:33 PM  Result Value Ref Range   Glucose-Capillary 117 (H) 65 - 99 mg/dL  Vitamin [id number]     Status: None   Collection Time: 05/12/16  4:48 PM  Result Value Ref Range   Vitamin B-12 715 180 - 914 pg/mL  Glucose, capillary     Status: Abnormal   Collection Time: 05/12/16  4:52 PM  Result Value Ref Range   Glucose-Capillary 155 (H) 65 - 99 mg/dL  Glucose, capillary     Status: Abnormal   Collection Time: 05/12/16  8:22 PM  Result Value Ref Range   Glucose-Capillary 116 (H) 65 - 99 mg/dL  Glucose, capillary     Status: Abnormal   Collection Time: 05/13/16 12:00 AM  Result Value Ref Range   Glucose-Capillary 141 (H) 65 - 99 mg/dL  Basic metabolic panel     Status: Abnormal   Collection Time: 05/13/16  3:22 AM  Result Value Ref Range   Sodium 150 (H) 135 - 145 mmol/L   Potassium 3.8 3.5 - 5.1 mmol/L   Chloride 114 (H) 101 - 111 mmol/L   CO2 27 22 - 32 mmol/L   Glucose, Bld 89 65 - 99 mg/dL   BUN 12 6 - 20 mg/dL   Creatinine, Ser 1.191.09 (H) 0.44 - 1.00 mg/dL   Calcium 8.6 (L) 8.9 - 10.3 mg/dL   GFR calc non Af Amer 49 (L) >60 mL/min   GFR calc Af Amer 57 (L) >60 mL/min   Anion gap 9 5 - 15  Magnesium     Status: None   Collection Time: 05/13/16  3:22 AM  Result Value Ref Range   Magnesium 2.0 1.7 - 2.4 mg/dL  CBC     Status: Abnormal   Collection Time: 05/13/16  3:22 AM  Result Value Ref Range   WBC 10.6 (H) 4.0 - 10.5 K/uL   RBC 2.76 (L) 3.87 - 5.11 MIL/uL   Hemoglobin 8.7 (L) 12.0 - 15.0 g/dL   HCT 14.727.1 (L) 82.936.0 - 56.246.0 %   MCV 98.2 78.0 - 100.0 fL   MCH 31.5 26.0 - 34.0 pg   MCHC 32.1 30.0 - 36.0 g/dL   RDW 13.015.9 (H) 86.511.5 - 78.415.5 %   Platelets 86 (L) 150 - 400 K/uL  Glucose,  capillary     Status: None   Collection Time: 05/13/16  3:33 AM  Result Value Ref Range   Glucose-Capillary 86 65 - 99 mg/dL  Lactic acid, plasma     Status: None   Collection Time: 05/13/16  9:06 AM  Result Value Ref Range   Lactic Acid, Venous 1.3 0.5 - 1.9  mmol/L    EKG: AF-VR 134  IMAGING: Dg Chest 1 View  Result Date: 05/11/2016 CLINICAL DATA:  Dementia/confusion with history of fall EXAM: CHEST 1 VIEW COMPARISON:  12/27/2015 FINDINGS: Post sternotomy changes are again evident. There is a left-sided duo lead pacing device with leads over the right atrium and right ventricle. The lungs are slightly hyperinflated. There is no acute infiltrate or effusion. No pneumothorax. Stable cardiomegaly with central vascular congestion. Aortic atherosclerosis. IMPRESSION: 1. Cardiomegaly with mild central congestion.  No overt edema 2. No acute infiltrate Electronically Signed   By: Jasmine Pang M.D.   On: 05/11/2016 23:39   Dg Lumbar Spine 2-3 Views  Result Date: 05/11/2016 CLINICAL DATA:  Increased confusion with history of fall EXAM: LUMBAR SPINE - 2-3 VIEW COMPARISON:  12/16/2015 FINDINGS: Five non rib-bearing lumbar type vertebra. SI joints are patent. Lumbar alignment stable. Mild superior endplate compression deformity of L3 minimally progressed. New mild compression deformity of T12 with approximately 25% loss of height anteriorly. Remaining vertebral bodies appear grossly maintained. Mild to moderate narrowing at L2-L3 with mild narrowing at L3-L4, L4-L5 and L5-S1. Dense atherosclerosis of the aorta IMPRESSION: 1. Slight progression of previously noted superior endplate deformity at L3 2. New mild compression deformity of T12 Electronically Signed   By: Jasmine Pang M.D.   On: 05/11/2016 23:37   Dg Pelvis 1-2 Views  Result Date: 05/11/2016 CLINICAL DATA:  Increased confusion with history of fall EXAM: PELVIS - 1-2 VIEW COMPARISON:  12/16/2015 FINDINGS: There is no evidence of pelvic fracture or diastasis. No pelvic bone lesions are seen. Mild arthritis of the bilateral hips. IMPRESSION: No acute osseous abnormality Electronically Signed   By: Jasmine Pang M.D.   On: 05/11/2016 23:37   Ct Head Wo Contrast  Result Date: 05/11/2016 CLINICAL DATA:   71 year old female with altered mental status. EXAM: CT HEAD WITHOUT CONTRAST TECHNIQUE: Contiguous axial images were obtained from the base of the skull through the vertex without intravenous contrast. COMPARISON:  None. FINDINGS: Brain: There is mild age-related atrophy and chronic microvascular ischemic changes. There is no acute intracranial hemorrhage. No mass effect or midline shift noted. No intra-axial fluid collection. Vascular: No hyperdense vessel or unexpected calcification. Skull: Normal. Negative for fracture or focal lesion. Sinuses/Orbits: No acute finding. Other: None. IMPRESSION: No acute intracranial hemorrhage. Mild age-related atrophy and chronic microvascular ischemic disease. If symptoms persist and there are no contraindications, MRI may provide better evaluation if clinically indicated. Electronically Signed   By: Elgie Collard M.D.   On: 05/11/2016 21:00   Dg Chest Port 1 View  Result Date: 05/13/2016 CLINICAL DATA:  Hypoxia over night, episode of supraventricular tachycardia. Hypotension. EXAM: PORTABLE CHEST 1 VIEW COMPARISON:  05/11/2016. FINDINGS: Trachea is midline. Heart is enlarged. Pacemaker lead tips project over the right atrium and right ventricle. Minimal linear atelectasis or scarring in the lingula. Lungs are otherwise clear. No pleural fluid. IMPRESSION: No acute findings. Electronically Signed   By: Leanna Battles M.D.   On: 05/13/2016 09:33    IMPRESSION:  AF with RVR- She has been getting IV Lopressor prn, B/P is borderline  Mental status  change- Work up per primary team  CAD- S/P CABG in 2010. Echo in Oct 2017 showed her EF to be 60-65%, grade 3 DD, moderate pulmonary HTN.  PTVDP-  BS pacemaker implanted Oct 2017  Anticoagulation- She had GI bleeding in 2017 but recently was cleared to resume Eliquis. It's unclear if she ever did this.    RECOMMENDATION: MD to see. Not knowing this pt personally I can't tell if this is acute worsening of a  chronic issue vs an acute event. For now I would watch for volume overload- I/O + 3L so far. She is getting IVF boluses so she can get IV Lopressor boluses. A Diltiazem drip may be more effective if we can keep an IV in her and her B/P will tolerate, but as long as she is agitated and combative she will be tachycardic. ? Consider sedation and intubation.    Her HGb is dropping- 8.7 today from 10.1 on adm, currently not anticoagulated.   Time Spent Directly with Patient: [address] minutes  [place], [place]  [phone] beeper 05/13/2016, 9:54 AM   Patient examined chart reviewed. As indicated no history due to encephalopathy and agitations Rhythm appears to be rapid flutter. Nurse only able to push intermittent iv lopressor. IV infiltrated Discussed with CCM and patient should likely be intubated / sedated then we can start continuous Drip such as cardizem.  Limited exam remarkable for no murmur and clear lungs anteriorly   [employer]

## 2016-05-13 NOTE — Progress Notes (Addendum)
[facility] PROGRESS NOTE  Gabrielle [name] ZOX:[id number] DOB: 25-Nov-1945 DOA: 05/11/2016 PCP: Irving Copas, MD  Inpatient summary and HPI 71 y.o. female with history of CAD status post CABG, diastolic CHF, hypertension, atrial fibrillation not on anticoagulation secondary to severe GI bleed, tachybradycardia syndrome status post pacemaker placement was brought to the ER secondary to confusion.  Assessment/Plan:  Hypotension;  Concern with sepsis, but also decrease volume in the differential.  Will give IV bolus.  Check lactic acid.   Acute encephalopathy; Unclear etiology. Differential; UTI) vs medication related (patient pn benzo, muscle relaxant and tramadol at home; unsure how much of this medications she has taken). Other concerns includes meningitis .  -Neurology consulted.  -LP initially planned on 3-13; but due to patient constant jerking movement unable to be performed.  -Dr Gwenlyn Perking Discussed with sister for use of moderate sedation, in order to accomplished test; and the decision was to continue empiric treatment for now and hold on LP. Will ask Neurology to evaluate for need of LP.  -Continue vanc, rocephin and acyclovir empirically for now -follow blood cx's and urine cx Multiple bacterial morphotypes.  -TSH and CT scan normal -B12 normal and RPR pending. Ammonia normal/  -Correct hypernatremia, check EEG.  -CCM consulted, should we do precedex ? Marland Kitchen  Low dose ativan ordered.   Hypoxemia;  Hypoxic overnight.  Sat 99 % on 5 L. chest x ray clear. .   -Paroxysmal atrial fibrillation, SVT; -Had two episode of SVT yesterday, patient was noticed to be hypoxic prior to one of the episodes.  -Schedule IV lopressor ordered.  -Normal mg level.  -IV fluids.  -not on anticoagulation given hx of severe GI bleed -CHADsVASC score 4 -monitor on telemetry  Back to svt, iv bolus, IV metoprolol.  Cardiology consulted.   -Hypokalemia and low magnesium  -replaced.    -AKI: -hypovolemia. Infection ?  Change IV fluids to D 5 %.  Follow trend.   -hx of diastolic heart failure -stable and compensated -will follow daily weight and strict intake/output  -Hypernatremia -Change IV fluids to D 5.   -Transaminases;  Follow trend.  Check [place]. Viral hepatitis.   Code Status: DNR Family Communication: no family at bedside;  Disposition Plan: remains in stepdown    Consultants:  IR for LP  Procedures:  See below for x-ray reports  LP planned for 3/13  Antibiotics:  Vancomycin, rocephin and acyclovir 05/11/16  HPI/Subjective: Overnight became hypoxic, had episode of SVT.  This morning hypotensive.  patient is less agitated, lethargic this morning.   Objective: Vitals:   05/13/16 0500 05/13/16 0642  BP: (!) 138/108 120/70  Pulse: (!) 218 (!) 110  Resp: (!) 32 (!) 24  Temp:      Intake/Output Summary (Last 24 hours) at 05/13/16 0835 Last data filed at 05/13/16 0600  Gross per 24 hour  Intake             2212 ml  Output               25 ml  Net             2187 ml   Filed Weights   05/12/16 0100 05/12/16 0440 05/13/16 0437  Weight: 75.2 kg (165 lb 12.6 oz) 75.2 kg (165 lb 12.6 oz) 77.4 kg (170 lb 10.2 oz)    Exam:   General:    Cardiovascular: mild tachycardia, no rubs, no gallops  Respiratory: good air movement, no wheezing, crackles right  Abdomen: soft,  ND, positive BS  Musculoskeletal: no edema, no cyanosis   Neuro; lethargic, make some noise, keeps eyes close.   Data Reviewed: Basic Metabolic Panel:  Recent Labs Lab 05/11/16 2032 05/11/16 2041 05/11/16 2315 05/12/16 0340 05/13/16 0322  NA 146* 147*  --  146* 150*  K 2.7* 2.8*  --  3.0* 3.8  CL 106 102  --  107 114*  CO2 28  --   --  32 27  GLUCOSE 90 85  --  126* 89  BUN 12 9  --  11 12  CREATININE 1.19* 1.20*  --  1.11* 1.09*  CALCIUM 9.5  --   --  8.6* 8.6*  MG  --   --  1.5*  --  2.0   Liver Function Tests:  Recent Labs Lab 05/11/16 2032  05/12/16 0340  AST 46* 42*  ALT 11* 10*  ALKPHOS 64 56  BILITOT 2.5* 2.8*  PROT 6.6 5.7*  ALBUMIN 4.0 3.4*   No results for input(s): LIPASE, AMYLASE in the last 168 hours.  Recent Labs Lab 05/11/16 2032 05/12/16 0731  AMMONIA 41* 31   CBC:  Recent Labs Lab 05/11/16 2032 05/11/16 2041 05/12/16 0340 05/13/16 0322  WBC 11.5*  --  10.4 10.6*  NEUTROABS 7.2  --   --   --   HGB 10.1* 10.9* 9.2* 8.7*  HCT 30.7* 32.0* 27.9* 27.1*  MCV 98.4  --  97.9 98.2  PLT 123*  --  96* 86*   Cardiac Enzymes: No results for input(s): CKTOTAL, CKMB, CKMBINDEX, TROPONINI in the last 168 hours. BNP (last 3 results)  Recent Labs  12/16/15 1544  BNP 751.9*    ProBNP (last 3 results) No results for input(s): PROBNP in the last 8760 hours.  CBG:  Recent Labs Lab 05/12/16 1233 05/12/16 1652 05/12/16 2022 05/13/16 0000 05/13/16 0333  GLUCAP 117* 155* 116* 141* 86    Recent Results (from the past 240 hour(s))  Urine culture     Status: Abnormal   Collection Time: 05/11/16  8:25 PM  Result Value Ref Range Status   Specimen Description URINE, RANDOM  Final   Special Requests NONE  Final   Culture MULTIPLE SPECIES PRESENT, SUGGEST RECOLLECTION (A)  Final   Report Status 05/13/2016 FINAL  Final  MRSA PCR Screening     Status: None   Collection Time: 05/12/16 12:24 AM  Result Value Ref Range Status   MRSA by PCR NEGATIVE NEGATIVE Final    Comment:        The GeneXpert MRSA Assay (FDA approved for NASAL specimens only), is one component of a comprehensive MRSA colonization surveillance program. It is not intended to diagnose MRSA infection nor to guide or monitor treatment for MRSA infections.      Studies: Dg Chest 1 View  Result Date: 05/11/2016 CLINICAL DATA:  Dementia/confusion with history of fall EXAM: CHEST 1 VIEW COMPARISON:  12/27/2015 FINDINGS: Post sternotomy changes are again evident. There is a left-sided duo lead pacing device with leads over the right  atrium and right ventricle. The lungs are slightly hyperinflated. There is no acute infiltrate or effusion. No pneumothorax. Stable cardiomegaly with central vascular congestion. Aortic atherosclerosis. IMPRESSION: 1. Cardiomegaly with mild central congestion.  No overt edema 2. No acute infiltrate Electronically Signed   By: Jasmine PangKim  Fujinaga M.D.   On: 05/11/2016 23:39   Dg Lumbar Spine 2-3 Views  Result Date: 05/11/2016 CLINICAL DATA:  Increased confusion with history of fall EXAM: LUMBAR SPINE -  2-3 VIEW COMPARISON:  12/16/2015 FINDINGS: Five non rib-bearing lumbar type vertebra. SI joints are patent. Lumbar alignment stable. Mild superior endplate compression deformity of L3 minimally progressed. New mild compression deformity of T12 with approximately 25% loss of height anteriorly. Remaining vertebral bodies appear grossly maintained. Mild to moderate narrowing at L2-L3 with mild narrowing at L3-L4, L4-L5 and L5-S1. Dense atherosclerosis of the aorta IMPRESSION: 1. Slight progression of previously noted superior endplate deformity at L3 2. New mild compression deformity of T12 Electronically Signed   By: Jasmine Pang M.D.   On: 05/11/2016 23:37   Dg Pelvis 1-2 Views  Result Date: 05/11/2016 CLINICAL DATA:  Increased confusion with history of fall EXAM: PELVIS - 1-2 VIEW COMPARISON:  12/16/2015 FINDINGS: There is no evidence of pelvic fracture or diastasis. No pelvic bone lesions are seen. Mild arthritis of the bilateral hips. IMPRESSION: No acute osseous abnormality Electronically Signed   By: Jasmine Pang M.D.   On: 05/11/2016 23:37   Ct Head Wo Contrast  Result Date: 05/11/2016 CLINICAL DATA:  71 year old female with altered mental status. EXAM: CT HEAD WITHOUT CONTRAST TECHNIQUE: Contiguous axial images were obtained from the base of the skull through the vertex without intravenous contrast. COMPARISON:  None. FINDINGS: Brain: There is mild age-related atrophy and chronic microvascular ischemic  changes. There is no acute intracranial hemorrhage. No mass effect or midline shift noted. No intra-axial fluid collection. Vascular: No hyperdense vessel or unexpected calcification. Skull: Normal. Negative for fracture or focal lesion. Sinuses/Orbits: No acute finding. Other: None. IMPRESSION: No acute intracranial hemorrhage. Mild age-related atrophy and chronic microvascular ischemic disease. If symptoms persist and there are no contraindications, MRI may provide better evaluation if clinically indicated. Electronically Signed   By: Elgie Collard M.D.   On: 05/11/2016 21:00    Scheduled Meds: . acyclovir  10 mg/kg (Adjusted) Intravenous Q12H  . budesonide (PULMICORT) nebulizer solution  0.5 mg Nebulization BID  . cefTRIAXone (ROCEPHIN)  IV  2 g Intravenous Q12H  . fluticasone  2 spray Each Nare Daily  . ipratropium  0.5 mg Nebulization TID  . levalbuterol  0.63 mg Nebulization TID  . mouth rinse  15 mL Mouth Rinse BID  . metoprolol  2.5 mg Intravenous Q6H  . OLANZapine zydis  5 mg Oral QHS  . vancomycin  750 mg Intravenous Q12H   Continuous Infusions: . dextrose      Principal Problem:   Acute encephalopathy Active Problems:   Essential hypertension   PAF (paroxysmal atrial fibrillation) (HCC)   Tachy-brady syndrome (HCC)   Hypokalemia    Time spent: 35 minutes    Lene Mckay A  [facility] Pager [phone].  If 7PM-7AM, please contact night-coverage at [email or web address], password [facility] 05/13/2016, 8:35 AM  LOS: 1 day

## 2016-05-14 ENCOUNTER — Inpatient Hospital Stay (HOSPITAL_COMMUNITY): Payer: Medicare Other

## 2016-05-14 DIAGNOSIS — I480 Paroxysmal atrial fibrillation: Secondary | ICD-10-CM

## 2016-05-14 DIAGNOSIS — G9340 Encephalopathy, unspecified: Secondary | ICD-10-CM

## 2016-05-14 DIAGNOSIS — N179 Acute kidney failure, unspecified: Secondary | ICD-10-CM

## 2016-05-14 LAB — COMPREHENSIVE METABOLIC PANEL
ALT: 16 U/L (ref 14–54)
AST: 76 U/L — ABNORMAL HIGH (ref 15–41)
Albumin: 3.4 g/dL — ABNORMAL LOW (ref 3.5–5.0)
Alkaline Phosphatase: 53 U/L (ref 38–126)
Anion gap: 5 (ref 5–15)
BUN: 20 mg/dL (ref 6–20)
CO2: 27 mmol/L (ref 22–32)
Calcium: 8.5 mg/dL — ABNORMAL LOW (ref 8.9–10.3)
Chloride: 114 mmol/L — ABNORMAL HIGH (ref 101–111)
Creatinine, Ser: 1.21 mg/dL — ABNORMAL HIGH (ref 0.44–1.00)
GFR calc Af Amer: 51 mL/min — ABNORMAL LOW (ref >60)
GFR calc non Af Amer: 44 mL/min — ABNORMAL LOW (ref >60)
Glucose, Bld: 131 mg/dL — ABNORMAL HIGH (ref 65–99)
Potassium: 3.4 mmol/L — ABNORMAL LOW (ref 3.5–5.1)
Sodium: 146 mmol/L — ABNORMAL HIGH (ref 135–145)
Total Bilirubin: 2.3 mg/dL — ABNORMAL HIGH (ref 0.3–1.2)
Total Protein: 5.7 g/dL — ABNORMAL LOW (ref 6.5–8.1)

## 2016-05-14 LAB — GLUCOSE, CAPILLARY
Glucose-Capillary: 137 mg/dL — ABNORMAL HIGH (ref 65–99)
Glucose-Capillary: 119 mg/dL — ABNORMAL HIGH (ref 65–99)
Glucose-Capillary: 114 mg/dL — ABNORMAL HIGH (ref 65–99)
Glucose-Capillary: 111 mg/dL — ABNORMAL HIGH (ref 65–99)
Glucose-Capillary: 107 mg/dL — ABNORMAL HIGH (ref 65–99)
Glucose-Capillary: 116 mg/dL — ABNORMAL HIGH (ref 65–99)

## 2016-05-14 LAB — CBC
HCT: 28.1 % — ABNORMAL LOW (ref 36.0–46.0)
Hemoglobin: 9.1 g/dL — ABNORMAL LOW (ref 12.0–15.0)
MCH: 32.5 pg (ref 26.0–34.0)
MCHC: 32.4 g/dL (ref 30.0–36.0)
MCV: 100.4 fL — ABNORMAL HIGH (ref 78.0–100.0)
Platelets: 95 10*3/uL — ABNORMAL LOW (ref 150–400)
RBC: 2.8 MIL/uL — ABNORMAL LOW (ref 3.87–5.11)
RDW: 16.2 % — ABNORMAL HIGH (ref 11.5–15.5)
WBC: 11.8 10*3/uL — ABNORMAL HIGH (ref 4.0–10.5)

## 2016-05-14 LAB — HEPATITIS PANEL, ACUTE
HCV Ab: <0.1 s/co ratio (ref 0.0–0.9)
Hep A IgM: NEGATIVE
Hep B C IgM: NEGATIVE
Hepatitis B Surface Ag: NEGATIVE

## 2016-05-14 LAB — MAGNESIUM
Magnesium: 2 mg/dL (ref 1.7–2.4)
Magnesium: 1.9 mg/dL (ref 1.7–2.4)

## 2016-05-14 LAB — PHOSPHORUS
Phosphorus: 1.8 mg/dL — ABNORMAL LOW (ref 2.5–4.6)
Phosphorus: 1.4 mg/dL — ABNORMAL LOW (ref 2.5–4.6)

## 2016-05-14 MED ORDER — DEXTROSE 5 % IV SOLN
1.0000 g | INTRAVENOUS | Status: AC
  Administered 2016-05-15 – 2016-05-16 (×2): 1 g via INTRAVENOUS
  Filled 2016-05-14 (×2): qty 10

## 2016-05-14 MED ORDER — VITAL HIGH PROTEIN PO LIQD
1000.0000 mL | ORAL | Status: DC
  Filled 2016-05-14: qty 1000

## 2016-05-14 MED ORDER — POTASSIUM CHLORIDE 2 MEQ/ML IV SOLN
Freq: Once | INTRAVENOUS | Status: AC
  Administered 2016-05-14: 12:00:00 via INTRAVENOUS
  Filled 2016-05-14: qty 1000

## 2016-05-14 MED ORDER — PRO-STAT SUGAR FREE PO LIQD: 30.0000 mL | Freq: Two times a day (BID) | ORAL | Status: DC

## 2016-05-14 MED ORDER — METOPROLOL TARTRATE 5 MG/5ML IV SOLN
5.0000 mg | Freq: Once | INTRAVENOUS | Status: AC
  Administered 2016-05-16: 5 mg via INTRAVENOUS
  Filled 2016-05-14: qty 5

## 2016-05-14 MED ORDER — VITAL AF 1.2 CAL PO LIQD
1000.0000 mL | ORAL | Status: DC
  Administered 2016-05-14 – 2016-05-17 (×3): 1000 mL
  Filled 2016-05-14 (×6): qty 1000

## 2016-05-14 MED ORDER — DEXMEDETOMIDINE HCL IN NACL 200 MCG/50ML IV SOLN
0.1000 ug/kg/h | INTRAVENOUS | Status: DC
  Administered 2016-05-14: 0.4 ug/kg/h via INTRAVENOUS
  Filled 2016-05-14 (×2): qty 50

## 2016-05-14 MED ORDER — METOPROLOL TARTRATE 25 MG/10 ML ORAL SUSPENSION
25.0000 mg | Freq: Two times a day (BID) | ORAL | Status: DC
  Administered 2016-05-14 – 2016-05-16 (×3): 25 mg
  Filled 2016-05-14 (×7): qty 10

## 2016-05-14 MED ORDER — LACTULOSE 10 GM/15ML PO SOLN: 30.0000 g | Freq: Two times a day (BID) | ORAL | Status: DC

## 2016-05-14 MED ORDER — HEPARIN SODIUM (PORCINE) 5000 UNIT/ML IJ SOLN
5000.0000 [IU] | Freq: Three times a day (TID) | INTRAMUSCULAR | Status: DC
  Administered 2016-05-14 – 2016-05-21 (×20): 5000 [IU] via SUBCUTANEOUS
  Filled 2016-05-14 (×17): qty 1

## 2016-05-14 MED ORDER — PRO-STAT SUGAR FREE PO LIQD
30.0000 mL | Freq: Every day | ORAL | Status: DC
  Administered 2016-05-15 – 2016-05-18 (×4): 30 mL
  Filled 2016-05-14 (×4): qty 30

## 2016-05-14 NOTE — Progress Notes (Signed)
eLink Physician-Brief Progress Note Patient Name: Gabrielle PalauVirginia W Brobeck DOB: [birth year] MRN: [id number]   Date of Service  05/14/2016  HPI/Events of Note  Agitated, confused, tachycardic.  eICU Interventions  Resume precedex.  Monitor HR.        Chelse Matas 05/14/2016, 12:30 AM

## 2016-05-14 NOTE — Progress Notes (Signed)
PULMONARY / CRITICAL CARE MEDICINE   Name: Gabrielle [name] MRN: [id number] DOB: [birth year]    ADMISSION DATE:  05/11/2016 CONSULTATION DATE:  3/14  REFERRING MD:  Sunnie Nielsen   CHIEF COMPLAINT:  Agitated delirium   SUBJECTIVE:  Sedated on precedex   VITAL SIGNS: BP (!) 148/73   Pulse 60   Temp 97.6 F (36.4 C) (Oral)   Resp (!) 25   Ht 5\' 2"  (1.575 m)   Wt 174 lb 6.1 oz (79.1 kg)   LMP  (LMP Unknown)   SpO2 100%   BMI 31.90 kg/m   HEMODYNAMICS: CVP:  [7 mmHg-35 mmHg] 15 mmHg  VENTILATOR SETTINGS:    INTAKE / OUTPUT: I/O last 3 completed shifts: In: 4521.5 [I.V.:3509.5; IV Piggyback:1012] Out: 325 [Urine:325]  PHYSICAL EXAMINATION: General appearance:  Sedated 71 Year old  female, well nourished/ NAD, still gets agitated at times  Eyes: anicteric sclerae , moist conjunctivae; PERRL, EOMI bilaterally. Mouth:  membranes and no mucosal ulcerations; normal hard and soft palate Neck: Trachea midline; neck supple, no JVD Lungs/chest: basilar rales, with normal respiratory effort and no intercostal retractions CV: irreg irreg no MRGs  Abdomen: Soft, non-tender; no masses or HSM Extremities: No peripheral edema or extremity lymphadenopathy Skin: Normal temperature, turgor and texture; no rash, ulcers or subcutaneous nodules Neuro/Psych: easily agitated. Currently sedated. Moves all extremities w/out focal def   LABS:  BMET  Recent Labs Lab 05/12/16 0340 05/13/16 0322 05/14/16 0850  NA 146* 150* 146*  K 3.0* 3.8 3.4*  CL 107 114* 114*  CO2 32 27 27  BUN 11 12 20   CREATININE 1.11* 1.09* 1.21*  GLUCOSE 126* 89 131*    Electrolytes  Recent Labs Lab 05/11/16 2315 05/12/16 0340 05/13/16 0322 05/14/16 0850  CALCIUM  --  8.6* 8.6* 8.5*  MG 1.5*  --  2.0  --     CBC  Recent Labs Lab 05/12/16 0340 05/13/16 0322 05/14/16 0850  WBC 10.4 10.6* 11.8*  HGB 9.2* 8.7* 9.1*  HCT 27.9* 27.1* 28.1*  PLT 96* 86* 95*    Coag's No results for input(s):  APTT, INR in the last 168 hours.  Sepsis Markers  Recent Labs Lab 05/13/16 0906 05/13/16 1229  LATICACIDVEN 1.3 4.4*    ABG  Recent Labs Lab 05/11/16 2214 05/13/16 1530  PHART 7.443 7.402  PCO2ART 47.6 43.4  PO2ART BELOW REPORTABLE RANGE 93.0    Liver Enzymes  Recent Labs Lab 05/11/16 2032 05/12/16 0340 05/14/16 0850  AST 46* 42* 76*  ALT 11* 10* 16  ALKPHOS 64 56 53  BILITOT 2.5* 2.8* 2.3*  ALBUMIN 4.0 3.4* 3.4*    Cardiac Enzymes No results for input(s): TROPONINI, PROBNP in the last 168 hours.  Glucose  Recent Labs Lab 05/13/16 1155 05/13/16 1608 05/13/16 1936 05/13/16 2335 05/14/16 0443 05/14/16 0759  GLUCAP 108* 132* 135* 137* 119* 114*    Imaging US Abdomen Limited  Result Date: 05/13/2016 CLINICAL DATA:  Abnormal transaminases. EXAM: US ABDOMEN LIMITED - RIGHT UPPER QUADRANT COMPARISON:  Ultrasound 07/30/2015. FINDINGS: Gallbladder: No gallstones or wall thickening visualized. No sonographic Murphy sign noted by sonographer. Common bile duct: Diameter: 9.6 mm Liver: Liver has a echogenic echotexture with irregular contour. Findings suggest cirrhosis. No focal hepatic abnormality identified. IMPRESSION: 1. Liver has an echogenic echotexture with irregular contour. Findings suggest cirrhosis. No focal hepatic abnormality identified. 2. Dilated common bile duct at 9.6 mm. No gallstones identified. MRCP can be obtained to further evaluate Electronically Signed   By: Maisie Fus  Register   On: 05/13/2016 11:40   US Renal Port  Result Date: 05/13/2016 CLINICAL DATA:  Acute renal failure EXAM: RENAL / URINARY TRACT ULTRASOUND COMPLETE COMPARISON:  None. FINDINGS: Right Kidney: Length: 12.2 cm. Echogenicity within normal limits. No mass or hydronephrosis visualized. Left Kidney: Length: 11.0 cm. Nonobstructing calculus in the mid LEFT renal cortex measures 10 mm. No hydronephrosis Bladder: Appears normal for degree of bladder distention. IMPRESSION: 1. LEFT  nephrolithiasis. 2. No renal obstruction. Electronically Signed   By: Genevive Bi M.D.   On: 05/13/2016 21:35   Dg Chest Port 1 View  Result Date: 05/13/2016 CLINICAL DATA:  Central line placement. EXAM: PORTABLE CHEST 1 VIEW COMPARISON:  05/13/2016 . FINDINGS: Right IJ line noted with tip over the superior vena cava. Mediastinum is stable. Cardiac pacer stable position. Prior CABG. Stable cardiomegaly. No focal infiltrate. No pleural effusion or pneumothorax . IMPRESSION: 1. Interim placement of right IJ line, its tip is projected over superior vena cava. No pneumothorax. 2. Cardiac pacer stable position. Prior CABG. Stable cardiomegaly. Electronically Signed   By: Maisie Fus  Register   On: 05/13/2016 15:19     STUDIES:  LP 3/14>>> abd [place] 3/14>>>1. Liver has an echogenic echotexture with irregular contour. Findings suggest cirrhosis. No focal hepatic abnormality identified. 2. Dilated common bile duct at 9.6 mm. No gallstones identified. MRCP can be obtained to further evaluate CT head 3/12: neg for bleed US renal 3/14 left nephrolithiasis but no obstruction  CXR 3/14 no edema, CVL good position. No effusion (personally reviewed by PB) CULTURES: UC 3/12: mult species  BC 3/13>>> UC 3/14>>>  ANTIBIOTICS: Acyclovir 3/13>>>3/15 Rocephin 3/13>>> vanc 3/13>>>3/15  SIGNIFICANT EVENTS:   LINES/TUBES: Right IJ CVL 3/14>>>    ASSESSMENT / PLAN:  Acute encephalopathy.  -favor a mix of infection and drug toxicity vs w/d as etiology. Responded nicely to precedex but having a hard time finding optimal level. She is either agitated or sedated.  Plan Cont low dose lorazepam w/ hold parameters PRN lorazepam for seizure Repeat 12 lead to see of QTc still prolonged.  RAS goal 0 Defer LP and EEG to neuro team  Cont to correct water imbalance Start lactulose  AF w/ RVR. Now AF w/ essentially CVR  h/o tachy/brady syndrome Grade 3 diastolic HF Plan CXR today-->look for edema  Cont  precedex  Cont BB -->Has pacemaker so can't go < 60 Con tele   Hypotension d/t seating meds Plan Cont neo for MAP >65  Mild AKI-->likely d/t hypotension yesterday  Plan MAP goal >65 Renal dose meds Repeat chem in am   SIRS/sepsis UT source Plan Cont rocephin Dc vanc and acyclovir (this is NOT meningitis) Repeat UC   Hypernatremia and hyperchloremia ->improved some w/ water replacement Plan Cont D5W at 55 ml/hr  Hypokalemia Plan Replace and recheck   Possible Cirrhosis by [place] Plan Repeat LFTs am Can be worked up out pt Repeat  ammonia  Aspiration risk Plan NPO Place FT  Anemia of critical illness. No evidence of bleeding Plan Trend CBC Cont PAS Start [place] heparin if neuro decides LP is off.   Discussion Agitated delirium superimposed on underlying dementia. I think likely UTI-->then either took to much of her muscle relaxants and/or SSRI OR is w/ding from benzo. Either way the treatment is supportive. Will cont low dose lorazepam, aim for RAS 0 and cont supportive care. I suspect time is the biggest therapy at this point.   DVT prophylaxis: PAS SUP: na Diet: start tubefeeds Activity:  bedrest Disposition : icu care Simonne MartinetPeter E Joscelynn Brutus ACNP-BC [facility] Pulmonary/Critical Care Pager # [phone] OR # [phone] if no answer     05/14/2016, 9:34 AM

## 2016-05-14 NOTE — Progress Notes (Signed)
RN spoke with MD about restraint order. MD aware and will follow up. RN will continue to monitor.

## 2016-05-14 NOTE — Progress Notes (Signed)
Progress Note  Patient Name: Gabrielle [name] Date of Encounter: 05/14/2016  Primary Cardiologist: Dr Anne FuSkains  Subjective   Sedated- not responding to verbal stimulation  Inpatient Medications    Scheduled Meds: . acyclovir  10 mg/kg (Adjusted) Intravenous Q12H  . budesonide (PULMICORT) nebulizer solution  0.5 mg Nebulization BID  . cefTRIAXone (ROCEPHIN)  IV  2 g Intravenous Q12H  . Chlorhexidine Gluconate Cloth  6 each Topical Q0600  . fluticasone  2 spray Each Nare Daily  . ipratropium  0.5 mg Nebulization TID  . levalbuterol  0.63 mg Nebulization TID  . LORazepam  0.5 mg Intravenous Q6H  . mouth rinse  15 mL Mouth Rinse BID  . metoprolol  2.5 mg Intravenous Q6H  . metoprolol  5 mg Intravenous Once  . thiamine injection  100 mg Intravenous Daily  . vancomycin  750 mg Intravenous Q12H   Continuous Infusions: . dexmedetomidine 0.3 mcg/kg/hr (05/14/16 0335)  . dextrose 55 mL/hr at 05/14/16 0441  . phenylephrine (NEO-SYNEPHRINE) Adult infusion 80 mcg/min (05/14/16 0657)   PRN Meds: acetaminophen **OR** acetaminophen, levalbuterol, morphine injection   Vital Signs    Vitals:   05/14/16 0530 05/14/16 0600 05/14/16 0630 05/14/16 0756  BP: (!) 146/61 (!) 149/60 (!) 148/73   Pulse: 60 62 60   Resp: (!) 27 (!) 24 (!) 25   Temp:    97.6 F (36.4 C)  TempSrc:    Oral  SpO2: 99% 99% 99%   Weight:      Height:        Intake/Output Summary (Last 24 hours) at 05/14/16 0830 Last data filed at 05/14/16 0600  Gross per 24 hour  Intake          3096.52 ml  Output              325 ml  Net          2771.52 ml   Filed Weights   05/12/16 0440 05/13/16 0437 05/14/16 0250  Weight: 165 lb 12.6 oz (75.2 kg) 170 lb 10.2 oz (77.4 kg) 174 lb 6.1 oz (79.1 kg)    Telemetry    AF- VR 140-60 (paced) - Personally Reviewed  ECG    AF with RVR- Personally Reviewed  Physical Exam   VHQ:IONGEGEN:Obese female, sedated, restrained Neck: No obvious JVD, thick neck Cardiac: RRR, no  murmurs, rubs, or gallops.  Respiratory:decreased breath sounds, scattered rhonchi GI: obese, soft, non-distended  MS: No edema; No deformity. Neuro:  sedated   Labs    Chemistry Recent Labs Lab 05/11/16 2032 05/11/16 2041 05/12/16 0340 05/13/16 0322  NA 146* 147* 146* 150*  K 2.7* 2.8* 3.0* 3.8  CL 106 102 107 114*  CO2 28  --  32 27  GLUCOSE 90 85 126* 89  BUN 12 9 11 12   CREATININE 1.19* 1.20* 1.11* 1.09*  CALCIUM 9.5  --  8.6* 8.6*  PROT 6.6  --  5.7*  --   ALBUMIN 4.0  --  3.4*  --   AST 46*  --  42*  --   ALT 11*  --  10*  --   ALKPHOS 64  --  56  --   BILITOT 2.5*  --  2.8*  --   GFRNONAA 44*  --  48* 49*  GFRAA 52*  --  56* 57*  ANIONGAP 12  --  7 9     Hematology Recent Labs Lab 05/11/16 2032 05/11/16 2041 05/12/16 0340 05/13/16 0322  WBC  11.5*  --  10.4 10.6*  RBC 3.12*  --  2.85* 2.76*  HGB 10.1* 10.9* 9.2* 8.7*  HCT 30.7* 32.0* 27.9* 27.1*  MCV 98.4  --  97.9 98.2  MCH 32.4  --  32.3 31.5  MCHC 32.9  --  33.0 32.1  RDW 15.1  --  15.3 15.9*  PLT 123*  --  96* 86*     Radiology    US Abdomen Limited  Result Date: 05/13/2016 CLINICAL DATA:  Abnormal transaminases. EXAM: US ABDOMEN LIMITED - RIGHT UPPER QUADRANT COMPARISON:  Ultrasound 07/30/2015. FINDINGS: Gallbladder: No gallstones or wall thickening visualized. No sonographic Murphy sign noted by sonographer. Common bile duct: Diameter: 9.6 mm Liver: Liver has a echogenic echotexture with irregular contour. Findings suggest cirrhosis. No focal hepatic abnormality identified. IMPRESSION: 1. Liver has an echogenic echotexture with irregular contour. Findings suggest cirrhosis. No focal hepatic abnormality identified. 2. Dilated common bile duct at 9.6 mm. No gallstones identified. MRCP can be obtained to further evaluate Electronically Signed   By: Maisie Fus  Register   On: 05/13/2016 11:40   US Renal Port  Result Date: 05/13/2016 CLINICAL DATA:  Acute renal failure EXAM: RENAL / URINARY TRACT  ULTRASOUND COMPLETE COMPARISON:  None. FINDINGS: Right Kidney: Length: 12.2 cm. Echogenicity within normal limits. No mass or hydronephrosis visualized. Left Kidney: Length: 11.0 cm. Nonobstructing calculus in the mid LEFT renal cortex measures 10 mm. No hydronephrosis Bladder: Appears normal for degree of bladder distention. IMPRESSION: 1. LEFT nephrolithiasis. 2. No renal obstruction. Electronically Signed   By: Genevive Bi M.D.   On: 05/13/2016 21:35   Dg Chest Port 1 View  Result Date: 05/13/2016 CLINICAL DATA:  Central line placement. EXAM: PORTABLE CHEST 1 VIEW COMPARISON:  05/13/2016 . FINDINGS: Right IJ line noted with tip over the superior vena cava. Mediastinum is stable. Cardiac pacer stable position. Prior CABG. Stable cardiomegaly. No focal infiltrate. No pleural effusion or pneumothorax . IMPRESSION: 1. Interim placement of right IJ line, its tip is projected over superior vena cava. No pneumothorax. 2. Cardiac pacer stable position. Prior CABG. Stable cardiomegaly. Electronically Signed   By: Maisie Fus  Register   On: 05/13/2016 15:19   Dg Chest Port 1 View  Result Date: 05/13/2016 CLINICAL DATA:  Hypoxia over night, episode of supraventricular tachycardia. Hypotension. EXAM: PORTABLE CHEST 1 VIEW COMPARISON:  05/11/2016. FINDINGS: Trachea is midline. Heart is enlarged. Pacemaker lead tips project over the right atrium and right ventricle. Minimal linear atelectasis or scarring in the lingula. Lungs are otherwise clear. No pleural fluid. IMPRESSION: No acute findings. Electronically Signed   By: Leanna Battles M.D.   On: 05/13/2016 09:33    Cardiac Studies   Echo 12/16/16- Study Conclusions  - Left ventricle: The cavity size was moderately dilated. Systolic   function was normal. The estimated ejection fraction was in the   range of 55% to 60%. Wall motion was normal; there were no   regional wall motion abnormalities. There was a reduced   contribution of atrial contraction to  ventricular filling, due to   increased ventricular diastolic pressure or atrial contractile   dysfunction. Doppler parameters are consistent with a reversible   restrictive pattern, indicative of decreased left ventricular   diastolic compliance and/or increased left atrial pressure (grade   3 diastolic dysfunction). Doppler parameters are consistent with   high ventricular filling pressure. - Mitral valve: Calcified annulus. The findings are consistent with   mild stenosis. There was mild to moderate regurgitation directed  eccentrically and posteriorly. Mean gradient (D): 3 mm Hg. Valve   area by pressure half-time: 1.42 cm^2. Valve area by continuity   equation (using LVOT flow): 1.6 cm^2. - Left atrium: The atrium was severely dilated. - Pulmonic valve: There was trivial regurgitation. - Pulmonary arteries: PA peak pressure: 57 mm Hg (S).  Impressions:  - The right ventricular systolic pressure was increased consistent   with moderate pulmonary hypertension.  Patient Profile     72 y.o. 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 She was brought to the ED at [facility] 05/11/16 by the police after the family called concerned about increasing confusion and paranoid behavior. We were asked to see for tachycardia. The pt was completely uncooperative and combative and required IV Precedex for sedation so we could control her HR.   Assessment & Plan    AF with RVR- Currently under better control since she has been sedated. She did not require intubation. When quiet her HR is paced at 60. When agitated her HR goes up- she is getting IV Lopressor prn.   Mental status change- Work up per primary team. She apparently has some baseline dementia but had been living alone in her own home.   CAD- S/P CABG in 2010. Echo in Oct 2017 showed her EF to be 60-65%,  grade 3 DD, moderate pulmonary HTN.  PTVDP-  BS pacemaker implanted Oct 2017  Anticoagulation- She had GI bleeding in 2017 but recently was cleared to resume Eliquis (see Dr Anne Fu OV 04/28/16). It's unclear if she ever did this. She is a CHADs VASc-4, hesitant to start anticoagulation now as her Hgb has been dropping- 10.1-8.7.    Signed, Corine Shelter, PA-C  05/14/2016, 8:30 AM    Sedated has pacer back up so can be heavy handed with iv lopressor and cardizem if needed.   w/u for encephalopathy ongoing. Hold anticoagulation until Hb stable   Charlton Haws

## 2016-05-14 NOTE — Progress Notes (Signed)
Pharmacy Antibiotic Note  Gabrielle HaverVirginia W [name] is a 71 y.o. female admitted on 05/11/2016 with confusion.  Pharmacy initially consulted for acyclovir/vancomycin/ceftriaxone dosing for meningitis.  Pt with acute encephalopathy - cause not clear.  UA shows possible UTI. Per CCM, clinical picture not c/w meningitis. Vancomycin and Acyclovir d/c'ed, and pharmacy asked to adjust dosing of Ceftriaxone for UTI.   Plan:  Reduce Ceftriaxone to 1g IV q24h for UTI dosing.  Ceftriaxone does not require renal/hepatic impairment; therefore, need for further dosage adjustment appears unlikely at present.    Will sign off at this time.  Please reconsult if a change in clinical status warrants re-evaluation of dosage.  Height: 5\' 2"  (157.5 cm) Weight: 174 lb 6.1 oz (79.1 kg) IBW/kg (Calculated) : 50.1  Temp (24hrs), Avg:98.2 F (36.8 C), Min:97.6 F (36.4 C), Max:98.8 F (37.1 C)   Recent Labs Lab 05/11/16 2032 05/11/16 2041 05/12/16 0340 05/13/16 0322 05/13/16 0906 05/13/16 1229 05/14/16 0850  WBC 11.5*  --  10.4 10.6*  --   --  11.8*  CREATININE 1.19* 1.20* 1.11* 1.09*  --   --  1.21*  LATICACIDVEN  --   --   --   --  1.3 4.4*  --     Estimated Creatinine Clearance: 40.9 mL/min (A) (by C-G formula based on SCr of 1.21 mg/dL (H)).    Allergies  Allergen Reactions  . Novocain [Procaine] Hives and Palpitations  . Penicillins Hives and Other (See Comments)    Has patient had a PCN reaction causing immediate rash, facial/tongue/throat swelling, SOB or lightheadedness with hypotension: No Has patient had a PCN reaction causing severe rash involving mucus membranes or skin necrosis: No Has patient had a PCN reaction that required hospitalization No Has patient had a PCN reaction occurring within the last 10 years: No If all of the above answers are "NO", then may proceed with Cephalosporin use.    Antimicrobials this admission: Doxy 3/13 x 1 dose Acyclovir 3/13 >> 3/15 Vanc 3/13 >> 3/15 CTX  3/13 >>  Dose adjustments this admission: 3/15: Dose of CTX adjusted from meningitis to UTI dosing  Microbiology results: 3/12 UCx: multiple species, suggest recollection 3/13 BCx: NGTD 3/13 MRSA PCR: neg 3/14 UCx: sent  Thank you for allowing pharmacy to be a part of this patient's care.    Greer PickerelJigna Flornce Record, PharmD, BCPS Pager: [phone] 05/14/2016 11:07 AM

## 2016-05-14 NOTE — Progress Notes (Signed)
Initial Nutrition Assessment  DOCUMENTATION CODES:   Obesity unspecified  INTERVENTION:  - Initiation of TF of Vital AF 1.2 at 20 mL/hr and increase by 10 mL q 12 hours to a goal rate of  50 mL/hr (1200 mL per day)  - Provide 1 Pro-stat q 24 hrs, each supplement provides 100 calories and 15 grams protein - Will order 100 mL free water TID - TF at goal rate + 1 Pro-Stat will provide 1540 calories, 105 grams protein, and 972 mL free water per day  - Recommend monitoring of magnesium, phosphorus and potassium, MD to replete as needed, for at least 3 days given hx of recent weight loss and risk for malnutrition  NUTRITION DIAGNOSIS:   Inadequate oral intake related to inability to eat as evidenced by NPO status.  GOAL:   Patient will meet greater than or equal to 90% of their needs  MONITOR:   TF tolerance, I & O's, Labs, Weight trends, Diet advancement  REASON FOR ASSESSMENT:   Consult Enteral/tube feeding initiation and management  ASSESSMENT:   71 y.o. female with PMH of CAD s/p CABG, diastolic CHF, HTN, atrial fibrillation not on anticoagulation secondary to severe GI bleed, tachybradycardia syndrome s/p pacemaker placement presents with acute encephalopathy after being found to have increasing confusion by pt's sister. Pt also complaining of low back pain  Pt unable to communicate at time of visit. No historians at bedside to assist in providing nutritional history.  Per chart review pt has experienced weight loss over the past 6 months. Pt weighed 200 lbs in September and currently weighs 174 lbs, 26 lb weight loss (13% in 6 months-significant for time frame)  No meal completion percentages available d/t pt's NPO status. Not sure at this time of pt's po intake PTA.   Pt attempted to get up at time of visit, frantically stating she needed to walk around, nurse tech did a great job calming pt down and explaining why she could not get up.  Labs reviewed; CBG (85-155), Na  (146), K (3.4), Chloride (114), Phosphorus (1.8) Medications reviewed; 100 mg Thiamine, 55 mL/hr IV D5 (providing 264 calories)  Unable to complete the nutrition-focused physical exam at this time. Pt at risk for malnutrition based on information gather but unable to diagnose with malnutrition at this time based on the ASPEN guidelines.   Diet Order:  Diet NPO time specified  Skin:  Reviewed, no issues  Last BM:  No BM Recorded  Height:   Ht Readings from Last 1 Encounters:  05/12/16 5\' 2"  (1.575 m)    Weight:   Wt Readings from Last 1 Encounters:  05/14/16 174 lb 6.1 oz (79.1 kg)    Ideal Body Weight:  50 kg  BMI:  Body mass index is 31.9 kg/m.  Estimated Nutritional Needs:   Kcal:  1500-1700  Protein:  95-110 grams (1.2-1.4 g/kg)  Fluid:  >/= 2 L/d  EDUCATION NEEDS:   Education needs no appropriate at this time  [employer] Ioannides Dietetic Intern

## 2016-05-15 ENCOUNTER — Inpatient Hospital Stay (HOSPITAL_COMMUNITY)
Admit: 2016-05-15 | Discharge: 2016-05-15 | Disposition: A | Discharge status: Home / Self Care | Payer: Medicare Other | Attending: Pulmonary Disease | Admitting: Pulmonary Disease

## 2016-05-15 ENCOUNTER — Inpatient Hospital Stay (HOSPITAL_COMMUNITY): Payer: Medicare Other

## 2016-05-15 DIAGNOSIS — R4182 Altered mental status, unspecified: Secondary | ICD-10-CM | POA: Insufficient documentation

## 2016-05-15 DIAGNOSIS — E43 Unspecified severe protein-calorie malnutrition: Secondary | ICD-10-CM | POA: Insufficient documentation

## 2016-05-15 LAB — GLUCOSE, CAPILLARY
Glucose-Capillary: 106 mg/dL — ABNORMAL HIGH (ref 65–99)
Glucose-Capillary: 111 mg/dL — ABNORMAL HIGH (ref 65–99)
Glucose-Capillary: 121 mg/dL — ABNORMAL HIGH (ref 65–99)
Glucose-Capillary: 133 mg/dL — ABNORMAL HIGH (ref 65–99)
Glucose-Capillary: 95 mg/dL (ref 65–99)
Glucose-Capillary: 98 mg/dL (ref 65–99)

## 2016-05-15 LAB — CUP PACEART REMOTE DEVICE CHECK
Battery Remaining Longevity: 150 mo
Battery Remaining Percentage: 100 %
Brady Statistic RA Percent Paced: 64 %
Brady Statistic RV Percent Paced: 1 %
Date Time Interrogation Session: 20180312042500
Implantable Lead Implant Date: 20171026
Implantable Lead Implant Date: 20171026
Implantable Lead Location: 753859
Implantable Lead Location: 753860
Implantable Lead Model: 7740
Implantable Lead Model: 7741
Implantable Lead Serial Number: 685941
Implantable Lead Serial Number: 818382
Implantable Pulse Generator Implant Date: 20171026
Lead Channel Impedance Value: 1007 Ohm
Lead Channel Impedance Value: 653 Ohm
Lead Channel Pacing Threshold Amplitude: 0.7 V
Lead Channel Pacing Threshold Amplitude: 0.9 V
Lead Channel Pacing Threshold Pulse Width: 0.4 ms
Lead Channel Pacing Threshold Pulse Width: 0.4 ms
Lead Channel Setting Pacing Amplitude: 3.5 V
Lead Channel Setting Pacing Amplitude: 3.5 V
Lead Channel Setting Pacing Pulse Width: 0.4 ms
Lead Channel Setting Sensing Sensitivity: 2.5 mV
Pulse Gen Serial Number: 766992

## 2016-05-15 LAB — PHOSPHORUS
Phosphorus: 1.6 mg/dL — ABNORMAL LOW (ref 2.5–4.6)
Phosphorus: 2.7 mg/dL (ref 2.5–4.6)

## 2016-05-15 LAB — URINE CULTURE
Culture: NO GROWTH
Culture: NO GROWTH

## 2016-05-15 LAB — COMPREHENSIVE METABOLIC PANEL
ALT: 16 U/L (ref 14–54)
AST: 57 U/L — ABNORMAL HIGH (ref 15–41)
Albumin: 3 g/dL — ABNORMAL LOW (ref 3.5–5.0)
Alkaline Phosphatase: 56 U/L (ref 38–126)
Anion gap: 3 — ABNORMAL LOW (ref 5–15)
BUN: 21 mg/dL — ABNORMAL HIGH (ref 6–20)
CO2: 27 mmol/L (ref 22–32)
Calcium: 8.5 mg/dL — ABNORMAL LOW (ref 8.9–10.3)
Chloride: 114 mmol/L — ABNORMAL HIGH (ref 101–111)
Creatinine, Ser: 1.03 mg/dL — ABNORMAL HIGH (ref 0.44–1.00)
GFR calc Af Amer: >60 mL/min (ref >60)
GFR calc non Af Amer: 53 mL/min — ABNORMAL LOW (ref >60)
Glucose, Bld: 115 mg/dL — ABNORMAL HIGH (ref 65–99)
Potassium: 4.3 mmol/L (ref 3.5–5.1)
Sodium: 144 mmol/L (ref 135–145)
Total Bilirubin: 1.4 mg/dL — ABNORMAL HIGH (ref 0.3–1.2)
Total Protein: 5.3 g/dL — ABNORMAL LOW (ref 6.5–8.1)

## 2016-05-15 LAB — CBC
HCT: 27.3 % — ABNORMAL LOW (ref 36.0–46.0)
Hemoglobin: 8.6 g/dL — ABNORMAL LOW (ref 12.0–15.0)
MCH: 31.5 pg (ref 26.0–34.0)
MCHC: 31.5 g/dL (ref 30.0–36.0)
MCV: 100 fL (ref 78.0–100.0)
Platelets: 71 10*3/uL — ABNORMAL LOW (ref 150–400)
RBC: 2.73 MIL/uL — ABNORMAL LOW (ref 3.87–5.11)
RDW: 16.2 % — ABNORMAL HIGH (ref 11.5–15.5)
WBC: 7.3 10*3/uL (ref 4.0–10.5)

## 2016-05-15 LAB — AMMONIA: Ammonia: 37 umol/L — ABNORMAL HIGH (ref 9–35)

## 2016-05-15 LAB — MAGNESIUM
Magnesium: 2 mg/dL (ref 1.7–2.4)
Magnesium: 1.9 mg/dL (ref 1.7–2.4)

## 2016-05-15 MED ORDER — POTASSIUM PHOSPHATES 15 MMOLE/5ML IV SOLN
20.0000 mmol | Freq: Once | INTRAVENOUS | Status: AC
  Administered 2016-05-15: 20 mmol via INTRAVENOUS
  Filled 2016-05-15: qty 6.67

## 2016-05-15 MED ORDER — QUETIAPINE FUMARATE 50 MG PO TABS
25.0000 mg | ORAL_TABLET | Freq: Two times a day (BID) | ORAL | Status: DC
Start: 2016-05-15 — End: 2016-05-18
  Administered 2016-05-15 – 2016-05-17 (×6): 25 mg
  Filled 2016-05-15 (×7): qty 1

## 2016-05-15 MED ORDER — FREE WATER
200.0000 mL | Status: DC
  Administered 2016-05-15 – 2016-05-18 (×16): 200 mL

## 2016-05-15 MED ORDER — LACTULOSE 10 GM/15ML PO SOLN
30.0000 g | Freq: Two times a day (BID) | ORAL | Status: DC
  Administered 2016-05-15 – 2016-05-18 (×8): 30 g
  Filled 2016-05-15 (×9): qty 45

## 2016-05-15 MED ORDER — DEXMEDETOMIDINE HCL IN NACL 200 MCG/50ML IV SOLN
0.4000 ug/kg/h | INTRAVENOUS | Status: DC
  Administered 2016-05-15: 0.2 ug/kg/h via INTRAVENOUS
  Administered 2016-05-15: 0.4 ug/kg/h via INTRAVENOUS
  Filled 2016-05-15: qty 50

## 2016-05-15 MED ORDER — VITAMIN B-1 100 MG PO TABS
100.0000 mg | ORAL_TABLET | Freq: Every day | ORAL | Status: DC
  Administered 2016-05-16 – 2016-05-21 (×6): 100 mg
  Filled 2016-05-15 (×6): qty 1

## 2016-05-15 MED ORDER — LORAZEPAM 2 MG/ML IJ SOLN
0.5000 mg | INTRAMUSCULAR | Status: DC | PRN
  Administered 2016-05-16 – 2016-05-21 (×5): 0.5 mg via INTRAVENOUS
  Filled 2016-05-15 (×5): qty 1

## 2016-05-15 NOTE — Progress Notes (Signed)
Nutrition Follow-up  DOCUMENTATION CODES:   Severe malnutrition in context of social or environmental circumstances, Obesity unspecified  INTERVENTION:  - Continue current TF regimen of Vital AF 1.2 at 50 mL/hr and Pro-stat q 24 hours, to provide 1540 calories, 105 grams protein, and 972 mL free water per day - Pt continues to be at refeeding risk, MD to monitor and supplement electrolytes as needed   NUTRITION DIAGNOSIS:   Malnutrition related to social / environmental circumstances as evidenced by percent weight loss, energy intake < or equal to 50% for > or equal to 1 month  Ongoing on TF regimen  GOAL:   Patient will meet greater than or equal to 90% of their needs  Met via TF regimen   MONITOR:   TF tolerance, I & O's, Labs, Weight trends, Diet advancement  ASSESSMENT:   70 y.o. female with PMH of CAD s/p CABG, diastolic CHF, HTN, atrial fibrillation not on anticoagulation secondary to severe GI bleed, tachybradycardia syndrome s/p pacemaker placement presents with acute encephalopathy after being found to have increasing confusion by pt's sister. Pt also complaining of low back pain  Pt remains sedated and did not communicate at time of visit.   Pt's TF is at goal rate and pt seems to be tolerating well. Electrolytes seemed balanced except phosphorus level is slightly low. Per chart review pt's stomach is soft not distended, with active bowel sounds.   Pt's twin sister was at bedside to assist in collecting pt's nutritional history. Per pt's sister pt would consume maybe one meal/d or less for a 1 month period or more. Pt's sister buys her groceries and brings them to her as pt lives alone and does not leave the house d/t stairs and decreased mobility.   Pt's sister reports recent weight loss for pt but unsure of amount. Pt's sister also reported pt's UBW is around 190-200 lbs and she weighed this in August/September.  Per chart review pt has experienced weight loss over  the past 6 months. Pt weighed 200 lbs in September and currently weighs 174 lbs, 26 lb weight loss (13% in 6 months-significant for time frame)  Labs reviewed; Phosphorus (1.6) Medications reviewed; 20 mmol IV Potassium phosphate, 100 mg Thiamine per tube, 10 mL IV D5  Diet Order:  Diet NPO time specified  Skin:  Reviewed, no issues  Last BM:  No BM Recorded  Height:   Ht Readings from Last 1 Encounters:  05/12/16 5' 2" (1.575 m)    Weight:   Wt Readings from Last 1 Encounters:  05/15/16 188 lb 0.8 oz (85.3 kg)    Ideal Body Weight:  50 kg  BMI:  Body mass index is 34.4 kg/m.  Estimated Nutritional Needs:   Kcal:  1500-1700  Protein:  95-110 grams (1.2-1.4 g/kg)  Fluid:  >/= 2 L/d  EDUCATION NEEDS:   Education needs no appropriate at this time   Ioannides Dietetic Intern  

## 2016-05-15 NOTE — Progress Notes (Signed)
Progress Note  Patient Name: Gabrielle PalauVirginia W [name] Date of Encounter: 05/15/2016  Primary Cardiologist: Dr Anne FuSkains  Subjective   Sedated- not responding to verbal stimulation  Inpatient Medications    Scheduled Meds: . budesonide (PULMICORT) nebulizer solution  0.5 mg Nebulization BID  . cefTRIAXone (ROCEPHIN)  IV  1 g Intravenous Q24H  . Chlorhexidine Gluconate Cloth  6 each Topical Q0600  . feeding supplement (PRO-STAT SUGAR FREE 64)  30 mL Per Tube Daily  . fluticasone  2 spray Each Nare Daily  . heparin subcutaneous  5,000 Units Subcutaneous Q8H  . ipratropium  0.5 mg Nebulization TID  . levalbuterol  0.63 mg Nebulization TID  . LORazepam  0.5 mg Intravenous Q6H  . mouth rinse  15 mL Mouth Rinse BID  . metoprolol  5 mg Intravenous Once  . metoprolol tartrate  25 mg Per Tube BID  . thiamine injection  100 mg Intravenous Daily   Continuous Infusions: . dexmedetomidine (PRECEDEX) IV infusion 0.4 mcg/kg/hr (05/15/16 0441)  . dextrose 55 mL/hr at 05/14/16 1800  . feeding supplement (VITAL AF 1.2 CAL) 1,000 mL (05/14/16 2245)  . phenylephrine (NEO-SYNEPHRINE) Adult infusion 10 mcg/min (05/14/16 1920)   PRN Meds: acetaminophen **OR** acetaminophen, levalbuterol   Vital Signs    Vitals:   05/15/16 0700 05/15/16 0730 05/15/16 0800 05/15/16 0817  BP: (!) 112/52 (!) 121/57 (!) 146/97   Pulse: 60 60 60 60  Resp: (!) 27 (!) 26 (!) 24 18  Temp:   97.3 F (36.3 C)   TempSrc:   Oral   SpO2: 100% 100% 99% 100%  Weight:      Height:        Intake/Output Summary (Last 24 hours) at 05/15/16 0853 Last data filed at 05/15/16 0700  Gross per 24 hour  Intake           2109.9 ml  Output              560 ml  Net           1549.9 ml   Filed Weights   05/13/16 0437 05/14/16 0250 05/15/16 0500  Weight: 170 lb 10.2 oz (77.4 kg) 174 lb 6.1 oz (79.1 kg) 188 lb 0.8 oz (85.3 kg)    Telemetry    AF- VR 60 (paced) - Personally Reviewed  ECG    AF with RVR- Personally  Reviewed  Physical Exam   ZOX:WRUEAGEN:Obese female, sedated, restrained Neck: No obvious JVD, thick neck Cardiac: RRR, no murmurs, rubs, or gallops.  Respiratory: decreased breath sounds, scattered rhonchi GI: obese, soft, non-distended  MS: No edema; No deformity. Neuro:  Precidex turned off overnight   Labs    Chemistry  Recent Labs Lab 05/12/16 0340 05/13/16 0322 05/14/16 0850 05/15/16 0455  NA 146* 150* 146* 144  K 3.0* 3.8 3.4* 4.3  CL 107 114* 114* 114*  CO2 32 27 27 27   GLUCOSE 126* 89 131* 115*  BUN 11 12 20  21*  CREATININE 1.11* 1.09* 1.21* 1.03*  CALCIUM 8.6* 8.6* 8.5* 8.5*  PROT 5.7*  --  5.7* 5.3*  ALBUMIN 3.4*  --  3.4* 3.0*  AST 42*  --  76* 57*  ALT 10*  --  16 16  ALKPHOS 56  --  53 56  BILITOT 2.8*  --  2.3* 1.4*  GFRNONAA 48* 49* 44* 53*  GFRAA 56* 57* 51* >60  ANIONGAP 7 9 5  3*     Hematology  Recent Labs Lab 05/13/16 0322 05/14/16  0850 05/15/16 0455  WBC 10.6* 11.8* 7.3  RBC 2.76* 2.80* 2.73*  HGB 8.7* 9.1* 8.6*  HCT 27.1* 28.1* 27.3*  MCV 98.2 100.4* 100.0  MCH 31.5 32.5 31.5  MCHC 32.1 32.4 31.5  RDW 15.9* 16.2* 16.2*  PLT 86* 95* 71*     Radiology    Dg Abd 1 View  Result Date: 05/14/2016 CLINICAL DATA:  71 y/o  F; feeding tube placement. EXAM: ABDOMEN - 1 VIEW COMPARISON:  12/01/2010 abdomen radiographs. FINDINGS: The bowel gas pattern is normal. Kidneys largely obscured by bowel gas. Moderate volume of stool in the colon. Enteric tube tip projects over distal stomach. IMPRESSION: Normal bowel gas pattern. Moderate volume of stool in the colon. Enteric tube tip projects over distal stomach. Electronically Signed   By: Mitzi Hansen M.D.   On: 05/14/2016 19:22   US Abdomen Limited  Result Date: 05/13/2016 CLINICAL DATA:  Abnormal transaminases. EXAM: US ABDOMEN LIMITED - RIGHT UPPER QUADRANT COMPARISON:  Ultrasound 07/30/2015. FINDINGS: Gallbladder: No gallstones or wall thickening visualized. No sonographic Murphy sign  noted by sonographer. Common bile duct: Diameter: 9.6 mm Liver: Liver has a echogenic echotexture with irregular contour. Findings suggest cirrhosis. No focal hepatic abnormality identified. IMPRESSION: 1. Liver has an echogenic echotexture with irregular contour. Findings suggest cirrhosis. No focal hepatic abnormality identified. 2. Dilated common bile duct at 9.6 mm. No gallstones identified. MRCP can be obtained to further evaluate Electronically Signed   By: Maisie Fus  Register   On: 05/13/2016 11:40   US Renal Port  Result Date: 05/13/2016 CLINICAL DATA:  Acute renal failure EXAM: RENAL / URINARY TRACT ULTRASOUND COMPLETE COMPARISON:  None. FINDINGS: Right Kidney: Length: 12.2 cm. Echogenicity within normal limits. No mass or hydronephrosis visualized. Left Kidney: Length: 11.0 cm. Nonobstructing calculus in the mid LEFT renal cortex measures 10 mm. No hydronephrosis Bladder: Appears normal for degree of bladder distention. IMPRESSION: 1. LEFT nephrolithiasis. 2. No renal obstruction. Electronically Signed   By: Genevive Bi M.D.   On: 05/13/2016 21:35   Dg Chest Port 1 View  Result Date: 05/15/2016 CLINICAL DATA:  Respiratory failure. EXAM: PORTABLE CHEST 1 VIEW COMPARISON:  05/13/2016. FINDINGS: Feeding tube noted with tip below left hemidiaphragm. Right IJ line stable position. Cardiac pacer with lead tips in right atrium right ventricle. Prior CABG. Cardiomegaly with normal pulmonary vascularity. Low lung volumes. Mild left base subsegmental atelectasis versus infiltrate. Small left pleural effusion IMPRESSION: 1. Feeding tube noted with tip below left hemidiaphragm. Right IJ line stable position. 2. Cardiac pacer stable position. Prior CABG. Stable cardiomegaly. 3. Low lung volumes. Mild left base subsegmental atelectasis. Mild infiltrate cannot be excluded. Small left pleural effusion cannot be excluded. Electronically Signed   By: Maisie Fus  Register   On: 05/15/2016 06:50   Dg Chest Port 1  View  Result Date: 05/13/2016 CLINICAL DATA:  Central line placement. EXAM: PORTABLE CHEST 1 VIEW COMPARISON:  05/13/2016 . FINDINGS: Right IJ line noted with tip over the superior vena cava. Mediastinum is stable. Cardiac pacer stable position. Prior CABG. Stable cardiomegaly. No focal infiltrate. No pleural effusion or pneumothorax . IMPRESSION: 1. Interim placement of right IJ line, its tip is projected over superior vena cava. No pneumothorax. 2. Cardiac pacer stable position. Prior CABG. Stable cardiomegaly. Electronically Signed   By: Maisie Fus  Register   On: 05/13/2016 15:19   Dg Chest Port 1 View  Result Date: 05/13/2016 CLINICAL DATA:  Hypoxia over night, episode of supraventricular tachycardia. Hypotension. EXAM: PORTABLE CHEST 1 VIEW COMPARISON:  05/11/2016. FINDINGS: Trachea is midline. Heart is enlarged. Pacemaker lead tips project over the right atrium and right ventricle. Minimal linear atelectasis or scarring in the lingula. Lungs are otherwise clear. No pleural fluid. IMPRESSION: No acute findings. Electronically Signed   By: Leanna Battles M.D.   On: 05/13/2016 09:33    Cardiac Studies   Echo 12/16/16- Study Conclusions  - Left ventricle: The cavity size was moderately dilated. Systolic   function was normal. The estimated ejection fraction was in the   range of 55% to 60%. Wall motion was normal; there were no   regional wall motion abnormalities. There was a reduced   contribution of atrial contraction to ventricular filling, due to   increased ventricular diastolic pressure or atrial contractile   dysfunction. Doppler parameters are consistent with a reversible   restrictive pattern, indicative of decreased left ventricular   diastolic compliance and/or increased left atrial pressure (grade   3 diastolic dysfunction). Doppler parameters are consistent with   high ventricular filling pressure. - Mitral valve: Calcified annulus. The findings are consistent with   mild  stenosis. There was mild to moderate regurgitation directed   eccentrically and posteriorly. Mean gradient (D): 3 mm Hg. Valve   area by pressure half-time: 1.42 cm^2. Valve area by continuity   equation (using LVOT flow): 1.6 cm^2. - Left atrium: The atrium was severely dilated. - Pulmonic valve: There was trivial regurgitation. - Pulmonary arteries: PA peak pressure: 57 mm Hg (S).  Impressions:  - The right ventricular systolic pressure was increased consistent   with moderate pulmonary hypertension.  Patient Profile     71 y.o. 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 She was brought to the ED at [facility] 05/11/16 by the police after the family called concerned about increasing confusion and paranoid behavior. We were asked to see for tachycardia. The pt was completely uncooperative and combative and required IV Precedex for sedation so we could control her HR.   Assessment & Plan    AF with RVR- Currently under better control since she has been sedated. She did not require intubation. When quiet her HR is paced at 60. When agitated her HR goes up- she is getting IV Lopressor prn.   Mental status change- Work up per primary team. She apparently has some baseline dementia but had been living alone in her own home.   CAD- S/P CABG in 2010. Echo in Oct 2017 showed her EF to be 60-65%, grade 3 DD, moderate pulmonary HTN.  PTVDP-  BS pacemaker implanted Oct 2017  Anticoagulation- She had GI bleeding in 2017 but recently was cleared to resume Eliquis (see Dr Anne Fu OV 04/28/16). It's unclear if she ever did this. She is a CHADs VASc-4, hesitant to start anticoagulation now as her Hgb has been dropping- 10.1-8.7.   Plan: Will see what happens today after sedation turned off. Further recommendations about cardiac medications pending.    Signed, Corine Shelter, PA-C  05/15/2016, 8:53 AM    Intubated and sedated intermittent pacing on telemetry  Lungs clear anteriorly no murmur Consider starting heparin over weekend if Hb stable    Charlton Haws

## 2016-05-15 NOTE — Progress Notes (Signed)
EEG completed, results pending. 

## 2016-05-15 NOTE — Progress Notes (Signed)
PULMONARY / CRITICAL CARE MEDICINE   Name: Gabrielle [name] MRN: [id number] DOB: 1945-10-18    ADMISSION DATE:  05/11/2016 CONSULTATION DATE:  3/14  REFERRING MD:  Sunnie Nielsen   CHIEF COMPLAINT:  Agitated delirium   SUBJECTIVE:  Sedated on precedex   VITAL SIGNS: BP (!) 122/39   Pulse 60   Temp 97.3 F (36.3 C) (Oral)   Resp (!) 23   Ht 5\' 2"  (1.575 m)   Wt 188 lb 0.8 oz (85.3 kg)   LMP  (LMP Unknown)   SpO2 97%   BMI 34.40 kg/m   HEMODYNAMICS: CVP:  [14 mmHg-16 mmHg] 14 mmHg  VENTILATOR SETTINGS:    INTAKE / OUTPUT: I/O last 3 completed shifts: In: 3799.1 [I.V.:3136.6; NG/GT:362.5; IV Piggyback:300] Out: 885 [Urine:885]  PHYSICAL EXAMINATION: General appearance:  71 Year old female, well nourished, sedated but will wake up Eyes: anicteric sclerae, moist conjunctivae; PERRL, EOMI bilaterally. Mouth:  membranes and no mucosal ulcerations; normal hard and soft palate Neck: Trachea midline; neck supple, no JVD Lungs/chest: faint basilar crackles, with normal respiratory effort and no intercostal retractions CV: regular irreg w/ CVR no MRGs  Abdomen: Soft, non-tender; no masses or HSM Extremities: No peripheral edema or extremity lymphadenopathy Skin: Normal temperature, turgor and texture; no rash, ulcers or subcutaneous nodules Neuro/Psych: sedated. When she wakes up agitated. Moves all extremities  LABS:  BMET  Recent Labs Lab 05/13/16 0322 05/14/16 0850 05/15/16 0455  NA 150* 146* 144  K 3.8 3.4* 4.3  CL 114* 114* 114*  CO2 27 27 27   BUN 12 20 21*  CREATININE 1.09* 1.21* 1.03*  GLUCOSE 89 131* 115*    Electrolytes  Recent Labs Lab 05/13/16 0322 05/14/16 0850 05/14/16 1827 05/15/16 0455  CALCIUM 8.6* 8.5*  --  8.5*  MG 2.0 2.0 1.9 2.0  PHOS  --  1.8* 1.4* 1.6*    CBC  Recent Labs Lab 05/13/16 0322 05/14/16 0850 05/15/16 0455  WBC 10.6* 11.8* 7.3  HGB 8.7* 9.1* 8.6*  HCT 27.1* 28.1* 27.3*  PLT 86* 95* 71*    Coag's No results  for input(s): APTT, INR in the last 168 hours.  Sepsis Markers  Recent Labs Lab 05/13/16 0906 05/13/16 1229  LATICACIDVEN 1.3 4.4*    ABG  Recent Labs Lab 05/11/16 2214 05/13/16 1530  PHART 7.443 7.402  PCO2ART 47.6 43.4  PO2ART BELOW REPORTABLE RANGE 93.0    Liver Enzymes  Recent Labs Lab 05/12/16 0340 05/14/16 0850 05/15/16 0455  AST 42* 76* 57*  ALT 10* 16 16  ALKPHOS 56 53 56  BILITOT 2.8* 2.3* 1.4*  ALBUMIN 3.4* 3.4* 3.0*    Cardiac Enzymes No results for input(s): TROPONINI, PROBNP in the last 168 hours.  Glucose  Recent Labs Lab 05/14/16 0759 05/14/16 1153 05/14/16 1946 05/14/16 2329 05/15/16 0452 05/15/16 0805  GLUCAP 114* 111* 107* 116* 111* 133*    Imaging Dg Abd 1 View  Result Date: 05/14/2016 CLINICAL DATA:  71 y/o  F; feeding tube placement. EXAM: ABDOMEN - 1 VIEW COMPARISON:  12/01/2010 abdomen radiographs. FINDINGS: The bowel gas pattern is normal. Kidneys largely obscured by bowel gas. Moderate volume of stool in the colon. Enteric tube tip projects over distal stomach. IMPRESSION: Normal bowel gas pattern. Moderate volume of stool in the colon. Enteric tube tip projects over distal stomach. Electronically Signed   By: Mitzi Hansen M.D.   On: 05/14/2016 19:22   Dg Chest Port 1 View  Result Date: 05/15/2016 CLINICAL DATA:  Respiratory  failure. EXAM: PORTABLE CHEST 1 VIEW COMPARISON:  05/13/2016. FINDINGS: Feeding tube noted with tip below left hemidiaphragm. Right IJ line stable position. Cardiac pacer with lead tips in right atrium right ventricle. Prior CABG. Cardiomegaly with normal pulmonary vascularity. Low lung volumes. Mild left base subsegmental atelectasis versus infiltrate. Small left pleural effusion IMPRESSION: 1. Feeding tube noted with tip below left hemidiaphragm. Right IJ line stable position. 2. Cardiac pacer stable position. Prior CABG. Stable cardiomegaly. 3. Low lung volumes. Mild left base subsegmental  atelectasis. Mild infiltrate cannot be excluded. Small left pleural effusion cannot be excluded. Electronically Signed   By: Maisie Fushomas  Register   On: 05/15/2016 06:50     STUDIES:  LP 3/14>>> abd US 3/14>>>1. Liver has an echogenic echotexture with irregular contour. Findings suggest cirrhosis. No focal hepatic abnormality identified. 2. Dilated common bile duct at 9.6 mm. No gallstones identified. MRCP can be obtained to further evaluate CT head 3/12: neg for bleed US renal 3/14 left nephrolithiasis but no obstruction  CXR 3/14 no edema, CVL good position. No effusion (personally reviewed by PB) CXR 3/16: personally reviewed. LLL ATX.  CULTURES: UC 3/12: mult species  BC 3/13>>> UC 3/14>>>  ANTIBIOTICS: Acyclovir 3/13>>>3/15 Rocephin 3/13>>> vanc 3/13>>>3/15  SIGNIFICANT EVENTS:   LINES/TUBES: Right IJ CVL 3/14>>>    ASSESSMENT / PLAN:  Acute encephalopathy -favoring infection and either drug toxicity or WD superimposed on dementia  ->spoke w/ neuro yesterday who agreed w/ our impression Plan Change lorazepam to PRN Dc precedex Add low dose Seroquel Dc EEG and MRI-->I will not sedate her to get this test.  Cont lactulose.   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  Cont tele NOT anticoagulation candidate MAP goal > 65  Mild AKI-->better Plan Avoid hypotension Renal dose meds f/u am chemistry  LLL ATX Plan Limit sedation pulm hygiene Wean O2  UTI-->F/U culture negative Plan  day 4/5 rocephin   Hypernatremia and hyperchloremia-->improved Plan No change in current water replacement f/u am chem  Hypophosphatemia Plan Replace and recheck  Possible cirrhosis  Plan Cont lactulose  Out-pt eval   Aspiration risk Plan NPO Tube feeds  Anemia of critical illness w/out evidence of bleeding Plan  Start [place] heparin   f/u CBC  Discussion  Agitated delirium from UTI and polypharm. Not sure if this is either too much or too little of home meds. She is slowly improving. For today plan is to transition to low dose seroquel, stop precedex and Make lorazepam PRN. Hope that she will cont to wake up. If we can get her off pressors she can go back to medicine over weekend.   DVT prophylaxis:  SUP: na Diet: tubefeeds Activity: BR Disposition : ICU  My critical care time 36 minutes   Simonne MartinetPeter E Azizah Lisle ACNP-BC [facility] Pulmonary/Critical Care Pager # [phone] OR # [phone] if no answer     05/15/2016, 10:33 AM

## 2016-05-15 NOTE — Progress Notes (Addendum)
Subjective: Patient is very sleepy but will answer to her name and attempt to lift hands to command. She has been off precedex for about one hour. Much improved since yesterday as far as agitation.   Exam: Vitals:   05/15/16 0800 05/15/16 0817  BP: (!) 146/97   Pulse: 60 60  Resp: (!) 24 18  Temp: 97.3 F (36.3 C)        Neuro:  CN: Pupils are equal and round. They are symmetrically reactive from 3-->2 mm. EOMI without nystagmus. Facial sensation is intact to light touch. Face is symmetric at rest with normal strength and mobility. Hearing is intact to conversational voice. Voice is normal but minimal verbal output Motor: Normal bulk, tone, and strength. 5/5 throughout. No drift.  Sensation: Intact to light touch.  DTRs: 2+, symmetric  Toes downgoing bilaterally. No pathologic reflexes.      Pertinent Labs/Diagnostics: Ammonia 37 Phosphorus 1.6 Magnesium 2.0 Creatinine 1.03, calcium 8.5, AST 5.7, MRI brain pending    Impression: As stated from previous consult note this is a 71 year old female presenting with altered mental status and significant agitation. At this time her agitation has significantly decreased. At this time still currently believe that this does not represent meningitis given her clinical picture. Also do not believe that this represents seizure however we will obtain an EEG to clarify. Patient has no meningismus and does not appear to have meningitis and thus at this time would hold off on LP. We'll also obtain MRI brain as her blood pressure has been elevated and her heart rate has also been significantly tachycardic. If both of these diagnostic tests are negative likely worsening of neurocognitive decline in the setting of mix of infection and or medication withdrawal. As stated in previous consult patient lives alone and it is unsure what medication she is actually taking and how much she is actually taking.  Spoke with primary team and will no get EEG or  MRI. Will S/O   Felicie MornDavid Shanique Aslinger PA-C [facility] [phone]  05/15/2016, 9:27 AM

## 2016-05-15 NOTE — Progress Notes (Signed)
Date:  May 15, 2016 Chart reviewed for concurrent status and case management needs. Will continue to follow patient progress. Discharge Planning: following for needs Expected discharge date: [id number] Jeriann Sayres, BSN, RN3, CCM   [phone] 

## 2016-05-15 NOTE — Procedures (Signed)
ELECTROENCEPHALOGRAM REPORT  Date of Study: 05/15/2016  Patient's Name: Gabrielle [name] MRN: [id number] Date of Birth: [birth year]  Referring Provider: Felicie Mornavid Smith, PA-C  Clinical History: This is a 71 year old woman with altered mental status.   Medications: acetaminophen (TYLENOL) tablet 650 mg  budesonide (PULMICORT) nebulizer solution 0.5 mg  cefTRIAXone (ROCEPHIN) 1 g in dextrose 5 % 50 mL IVPB  Chlorhexidine Gluconate Cloth 2 % PADS 6 each  dexmedetomidine (PRECEDEX) 200 MCG/50ML (4 mcg/mL) infusion  dextrose 5 % solution  feeding supplement (PRO-STAT SUGAR FREE 64) liquid 30 mL  feeding supplement (VITAL AF 1.2 CAL) liquid 1,000 mL  fluticasone (FLONASE) 50 MCG/ACT nasal spray 2 spray  heparin injection 5,000 Units  ipratropium (ATROVENT) nebulizer solution 0.5 mg  levalbuterol (XOPENEX) nebulizer solution 0.63 mg  levalbuterol (XOPENEX) nebulizer solution 0.63 mg  LORazepam (ATIVAN) injection 0.5 mg  MEDLINE mouth rinse  metoprolol (LOPRESSOR) injection 5 mg  metoprolol tartrate (LOPRESSOR) 25 mg/10 mL oral suspension 25 mg  phenylephrine (NEO-SYNEPHRINE) 40 mg in sodium chloride 0.9 % 250 mL (0.16 mg/mL) infusion  thiamine (B-1) injection 100 mg   Technical Summary: A multichannel digital EEG recording measured by the international 10-20 system with electrodes applied with paste and impedances below 5000 ohms performed as portable with EKG monitoring in an awake and asleep patient.  Hyperventilation and photic stimulation were not performed.  The digital EEG was referentially recorded, reformatted, and digitally filtered in a variety of bipolar and referential montages for optimal display.   Description: The patient is awake and asleep during the recording. She is noted to be lethargic, and would get agitated when stimulated. Precedex turned off an hour prior to EEG. During brief period of wakefulness, there is no clear posterior dominant rhythm. The background consists  of a large amount of diffuse 4-5 Hz theta and occasional 2-3 Hz delta slowing.  During drowsiness and sleep, there is an increase in theta and delta slowing of the background with poorly formed vertex waves and sleep spindles seen.  Hyperventilation and photic stimulation were not performed.  There were no epileptiform discharges or electrographic seizures seen.    EKG lead showed rare extrasystolic beats.  Impression: This awake and asleep EEG is abnormal due to mild to moderate diffuse slowing of the background.  Clinical Correlation of the above findings indicates diffuse cerebral dysfunction that is non-specific in etiology and can be seen with hypoxic/ischemic injury, toxic/metabolic encephalopathies, neurodegenerative disorders, or medication effect.  The absence of epileptiform discharges does not rule out a clinical diagnosis of epilepsy.  Clinical correlation is advised.   Patrcia DollyKaren Jasey Cortez, M.D.

## 2016-05-16 ENCOUNTER — Inpatient Hospital Stay (HOSPITAL_COMMUNITY): Payer: Medicare Other

## 2016-05-16 DIAGNOSIS — I10 Essential (primary) hypertension: Secondary | ICD-10-CM

## 2016-05-16 DIAGNOSIS — I495 Sick sinus syndrome: Secondary | ICD-10-CM

## 2016-05-16 LAB — BASIC METABOLIC PANEL
Anion gap: 5 (ref 5–15)
Anion gap: 6 (ref 5–15)
BUN: 19 mg/dL (ref 6–20)
BUN: 19 mg/dL (ref 6–20)
CO2: 26 mmol/L (ref 22–32)
CO2: 28 mmol/L (ref 22–32)
Calcium: 8.8 mg/dL — ABNORMAL LOW (ref 8.9–10.3)
Calcium: 9.2 mg/dL (ref 8.9–10.3)
Chloride: 115 mmol/L — ABNORMAL HIGH (ref 101–111)
Chloride: 110 mmol/L (ref 101–111)
Creatinine, Ser: 0.94 mg/dL (ref 0.44–1.00)
Creatinine, Ser: 0.89 mg/dL (ref 0.44–1.00)
GFR calc Af Amer: >60 mL/min (ref >60)
GFR calc Af Amer: >60 mL/min (ref >60)
GFR calc non Af Amer: 59 mL/min — ABNORMAL LOW (ref >60)
GFR calc non Af Amer: >60 mL/min (ref >60)
Glucose, Bld: 108 mg/dL — ABNORMAL HIGH (ref 65–99)
Glucose, Bld: 169 mg/dL — ABNORMAL HIGH (ref 65–99)
Potassium: 3.9 mmol/L (ref 3.5–5.1)
Potassium: 3.8 mmol/L (ref 3.5–5.1)
Sodium: 146 mmol/L — ABNORMAL HIGH (ref 135–145)
Sodium: 144 mmol/L (ref 135–145)

## 2016-05-16 LAB — GLUCOSE, CAPILLARY
Glucose-Capillary: 105 mg/dL — ABNORMAL HIGH (ref 65–99)
Glucose-Capillary: 180 mg/dL — ABNORMAL HIGH (ref 65–99)
Glucose-Capillary: 166 mg/dL — ABNORMAL HIGH (ref 65–99)
Glucose-Capillary: 166 mg/dL — ABNORMAL HIGH (ref 65–99)

## 2016-05-16 LAB — CBC
HCT: 27.6 % — ABNORMAL LOW (ref 36.0–46.0)
Hemoglobin: 8.8 g/dL — ABNORMAL LOW (ref 12.0–15.0)
MCH: 32 pg (ref 26.0–34.0)
MCHC: 31.9 g/dL (ref 30.0–36.0)
MCV: 100.4 fL — ABNORMAL HIGH (ref 78.0–100.0)
Platelets: 76 10*3/uL — ABNORMAL LOW (ref 150–400)
RBC: 2.75 MIL/uL — ABNORMAL LOW (ref 3.87–5.11)
RDW: 16.2 % — ABNORMAL HIGH (ref 11.5–15.5)
WBC: 7.6 10*3/uL (ref 4.0–10.5)

## 2016-05-16 LAB — MAGNESIUM: Magnesium: 1.9 mg/dL (ref 1.7–2.4)

## 2016-05-16 LAB — TROPONIN I: Troponin I: 0.2 ng/mL (ref <0.03)

## 2016-05-16 MED ORDER — CHLORHEXIDINE GLUCONATE 0.12 % MT SOLN
15.0000 mL | Freq: Two times a day (BID) | OROMUCOSAL | Status: DC
Start: 2016-05-16 — End: 2016-05-21
  Administered 2016-05-16 – 2016-05-21 (×11): 15 mL via OROMUCOSAL
  Filled 2016-05-16 (×8): qty 15

## 2016-05-16 MED ORDER — POTASSIUM CHLORIDE 20 MEQ/15ML (10%) PO SOLN
20.0000 meq | Freq: Once | ORAL | Status: AC
  Administered 2016-05-16: 20 meq
  Filled 2016-05-16: qty 15

## 2016-05-16 MED ORDER — ONDANSETRON HCL 4 MG/2ML IJ SOLN
4.0000 mg | Freq: Four times a day (QID) | INTRAMUSCULAR | Status: DC | PRN
  Administered 2016-05-16: 4 mg via INTRAVENOUS
  Filled 2016-05-16: qty 2

## 2016-05-16 MED ORDER — ASPIRIN 81 MG PO CHEW
81.0000 mg | CHEWABLE_TABLET | Freq: Every day | ORAL | Status: DC
  Administered 2016-05-16 – 2016-05-21 (×6): 81 mg
  Filled 2016-05-16 (×6): qty 1

## 2016-05-16 MED ORDER — ORAL CARE MOUTH RINSE
15.0000 mL | Freq: Two times a day (BID) | OROMUCOSAL | Status: DC
  Administered 2016-05-16 – 2016-05-17 (×3): 15 mL via OROMUCOSAL

## 2016-05-16 MED ORDER — AMLODIPINE BESYLATE 5 MG PO TABS
5.0000 mg | ORAL_TABLET | Freq: Every day | ORAL | Status: DC
  Administered 2016-05-16 – 2016-05-18 (×3): 5 mg via ORAL
  Filled 2016-05-16 (×3): qty 1

## 2016-05-16 MED ORDER — MAGNESIUM SULFATE IN D5W 1-5 GM/100ML-% IV SOLN
1.0000 g | Freq: Once | INTRAVENOUS | Status: AC
Start: 2016-05-16 — End: 2016-05-16
  Administered 2016-05-16: 1 g via INTRAVENOUS
  Filled 2016-05-16: qty 100

## 2016-05-16 NOTE — Progress Notes (Signed)
CRITICAL VALUE ALERT  Critical value received:  Troponin 0.20  Date of notification:  05/16/16  Time of notification:  1856  Critical value read back:Yes.    Nurse who received alert:  Sharl Ma  MD notified (1st page):  Dr. Levada Schilling  Time of first page:  [phone]

## 2016-05-16 NOTE — Progress Notes (Signed)
eLink Physician-Brief Progress Note Patient Name: Gabrielle PalauVirginia W Crumbley DOB: September 04, 1945 MRN: [id number]   Date of Service  05/16/2016  HPI/Events of Note  Multiple issues: 1. EKG - NSR with PAC's and anterolateral infarct, age undetermined, 2. Troponin = 0.20, 3. K+ = 3.8, Mg++ =1.9 and Creatinine = 0.89.  eICU Interventions  Will order: 1. ASA 81 mg PO Q day. 2. Replace K+ and Mg++  3. Continue to trend Troponin.      Intervention Category Intermediate Interventions: Diagnostic test evaluation  Lenell AntuSommer,Garrison Michie Eugene 05/16/2016, 6:56 PM

## 2016-05-16 NOTE — Progress Notes (Signed)
eLink Physician-Brief Progress Note Patient Name: Gabrielle PalauVirginia W [name] DOB: November 27, 1945 MRN: [id number]   Date of Service  05/16/2016  HPI/Events of Note  Nurse reports 2 episodes of NSVT. Now NSR with rate = 93.  eICU Interventions  Will order: 1. 12 Lead EKG STAT. 2. Cycle Troponin. 3. BMP and Mg++ level now.      Intervention Category Major Interventions: Acid-Base disturbance - evaluation and management;Arrhythmia - evaluation and management  Sommer,Steven Eugene 05/16/2016, 4:51 PM

## 2016-05-16 NOTE — Progress Notes (Signed)
Progress Note  Patient Name: Gabrielle [name] Date of Encounter: 05/16/2016  Primary Cardiologist: Dr Anne Fu  Subjective   Sedated- not responding to verbal stimulation  Inpatient Medications    Scheduled Meds: . budesonide (PULMICORT) nebulizer solution  0.5 mg Nebulization BID  . cefTRIAXone (ROCEPHIN)  IV  1 g Intravenous Q24H  . Chlorhexidine Gluconate Cloth  6 each Topical Q0600  . feeding supplement (PRO-STAT SUGAR FREE 64)  30 mL Per Tube Daily  . fluticasone  2 spray Each Nare Daily  . free water  200 mL Per Tube Q4H  . heparin subcutaneous  5,000 Units Subcutaneous Q8H  . ipratropium  0.5 mg Nebulization TID  . lactulose  30 g Per Tube BID  . levalbuterol  0.63 mg Nebulization TID  . mouth rinse  15 mL Mouth Rinse BID  . metoprolol tartrate  25 mg Per Tube BID  . QUEtiapine  25 mg Per Tube BID  . thiamine  100 mg Per Tube Daily   Continuous Infusions: . dextrose 10 mL (05/15/16 1240)  . feeding supplement (VITAL AF 1.2 CAL) 1,000 mL (05/16/16 0258)  . phenylephrine (NEO-SYNEPHRINE) Adult infusion Stopped (05/16/16 0115)   PRN Meds: acetaminophen **OR** acetaminophen, levalbuterol, LORazepam   Vital Signs    Vitals:   05/16/16 0600 05/16/16 0700 05/16/16 0800 05/16/16 0830  BP: (!) 189/81 (!) 180/66 (!) 208/74 (!) 185/72  Pulse: 89 76 81 80  Resp: (!) 31 (!) 33 (!) 40 (!) 36  Temp:      TempSrc:      SpO2: 95% 96% 95% 99%  Weight:      Height:        Intake/Output Summary (Last 24 hours) at 05/16/16 0925 Last data filed at 05/16/16 0600  Gross per 24 hour  Intake          2409.68 ml  Output             1315 ml  Net          1094.68 ml   Filed Weights   05/14/16 0250 05/15/16 0500 05/16/16 0500  Weight: 174 lb 6.1 oz (79.1 kg) 188 lb 0.8 oz (85.3 kg) 181 lb (82.1 kg)    Telemetry    AF- VR 60 (paced) - Personally Reviewed  ECG    AF with RVR- Personally Reviewed  Physical Exam   ZOX:WRUEA female, dose not open her eyes but answers  questions appropriately Neck: No obvious JVD, thick neck Cardiac: irregularly irregular, no murmurs, rubs, or gallops.  Respiratory: decreased breath sounds, scattered rhonchi anteriorly GI: obese, soft, non-distended  MS: No edema; No deformity. Neuro: answers questions appropriately   Labs    Chemistry  Recent Labs Lab 05/12/16 0340  05/14/16 0850 05/15/16 0455 05/16/16 0402  NA 146*  < > 146* 144 146*  K 3.0*  < > 3.4* 4.3 3.9  CL 107  < > 114* 114* 115*  CO2 32  < > 27 27 26   GLUCOSE 126*  < > 131* 115* 108*  BUN 11  < > 20 21* 19  CREATININE 1.11*  < > 1.21* 1.03* 0.94  CALCIUM 8.6*  < > 8.5* 8.5* 8.8*  PROT 5.7*  --  5.7* 5.3*  --   ALBUMIN 3.4*  --  3.4* 3.0*  --   AST 42*  --  76* 57*  --   ALT 10*  --  16 16  --   ALKPHOS 56  --  53  56  --   BILITOT 2.8*  --  2.3* 1.4*  --   GFRNONAA 48*  < > 44* 53* 59*  GFRAA 56*  < > 51* >60 >60  ANIONGAP 7  < > 5 3* 5  < > = values in this interval not displayed.   Hematology  Recent Labs Lab 05/14/16 0850 05/15/16 0455 05/16/16 0402  WBC 11.8* 7.3 7.6  RBC 2.80* 2.73* 2.75*  HGB 9.1* 8.6* 8.8*  HCT 28.1* 27.3* 27.6*  MCV 100.4* 100.0 100.4*  MCH 32.5 31.5 32.0  MCHC 32.4 31.5 31.9  RDW 16.2* 16.2* 16.2*  PLT 95* 71* 76*     Radiology    Dg Abd 1 View  Result Date: 05/16/2016 CLINICAL DATA:  NG tube placement EXAM: ABDOMEN - 1 VIEW COMPARISON:  Abdominal radiograph 05/14/2016 FINDINGS: Weighted enteric tube tip overlies the gastric body. Nonobstructive bowel gas pattern. IMPRESSION: Weighted enteric tube tip overlying the gastric body. Electronically Signed   By: Deatra RobinsonKevin  Herman M.D.   On: 05/16/2016 00:57   Dg Abd 1 View  Result Date: 05/14/2016 CLINICAL DATA:  71 y/o  F; feeding tube placement. EXAM: ABDOMEN - 1 VIEW COMPARISON:  12/01/2010 abdomen radiographs. FINDINGS: The bowel gas pattern is normal. Kidneys largely obscured by bowel gas. Moderate volume of stool in the colon. Enteric tube tip projects  over distal stomach. IMPRESSION: Normal bowel gas pattern. Moderate volume of stool in the colon. Enteric tube tip projects over distal stomach. Electronically Signed   By: Mitzi HansenLance  Furusawa-Stratton M.D.   On: 05/14/2016 19:22   Dg Chest Port 1 View  Result Date: 05/15/2016 CLINICAL DATA:  Respiratory failure. EXAM: PORTABLE CHEST 1 VIEW COMPARISON:  05/13/2016. FINDINGS: Feeding tube noted with tip below left hemidiaphragm. Right IJ line stable position. Cardiac pacer with lead tips in right atrium right ventricle. Prior CABG. Cardiomegaly with normal pulmonary vascularity. Low lung volumes. Mild left base subsegmental atelectasis versus infiltrate. Small left pleural effusion IMPRESSION: 1. Feeding tube noted with tip below left hemidiaphragm. Right IJ line stable position. 2. Cardiac pacer stable position. Prior CABG. Stable cardiomegaly. 3. Low lung volumes. Mild left base subsegmental atelectasis. Mild infiltrate cannot be excluded. Small left pleural effusion cannot be excluded. Electronically Signed   By: Maisie Fushomas  Register   On: 05/15/2016 06:50    Cardiac Studies   Echo 12/16/16- Study Conclusions  - Left ventricle: The cavity size was moderately dilated. Systolic   function was normal. The estimated ejection fraction was in the   range of 55% to 60%. Wall motion was normal; there were no   regional wall motion abnormalities. There was a reduced   contribution of atrial contraction to ventricular filling, due to   increased ventricular diastolic pressure or atrial contractile   dysfunction. Doppler parameters are consistent with a reversible   restrictive pattern, indicative of decreased left ventricular   diastolic compliance and/or increased left atrial pressure (grade   3 diastolic dysfunction). Doppler parameters are consistent with   high ventricular filling pressure. - Mitral valve: Calcified annulus. The findings are consistent with   mild stenosis. There was mild to moderate  regurgitation directed   eccentrically and posteriorly. Mean gradient (D): 3 mm Hg. Valve   area by pressure half-time: 1.42 cm^2. Valve area by continuity   equation (using LVOT flow): 1.6 cm^2. - Left atrium: The atrium was severely dilated. - Pulmonic valve: There was trivial regurgitation. - Pulmonary arteries: PA peak pressure: 57 mm Hg (S).  Impressions:  -  The right ventricular systolic pressure was increased consistent   with moderate pulmonary hypertension.  Patient Profile     71 y.o. 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 She was brought to the ED at [facility] 05/11/16 by the police after the family called concerned about increasing confusion and paranoid behavior. We were asked to see for tachycardia. The pt was completely uncooperative and combative and required IV Precedex for sedation so we could control her HR.   Assessment & Plan    AF with RVR- Her HR is fairly well controlled in the 80's while laying in bed.  She is now on PO Lopressor.   Mental status change- Work up per primary team. She apparently has some baseline dementia but had been living alone in her own home.   CAD- S/P CABG in 2010. Echo in Oct 2017 showed her EF to be 60-65%, grade 3 DD, moderate pulmonary HTN.  She has not had any anginal symptoms.   PTVDP-  BS pacemaker implanted Oct 2017   Anticoagulation- She had GI bleeding in 2017 but recently was cleared to resume Eliquis (see Dr Anne Fu OV 04/28/16). It's unclear if she ever did this. She is a CHADs VASc-4, hesitant to start anticoagulation now as her Hgb has been dropping- 10.1-8.7. Currently still low at 8.8.  Will get input from CCM if ok to resume anticoagulation.   Signed, Armanda Magic, MD  05/16/2016, 9:25 AM

## 2016-05-16 NOTE — Progress Notes (Signed)
PULMONARY / CRITICAL CARE MEDICINE   Name: Gabrielle [name] MRN: [id number] DOB: Oct 18, 1945    ADMISSION DATE:  05/11/2016 CONSULTATION DATE:  3/14  REFERRING MD:  Sunnie Nielsen   CHIEF COMPLAINT:  Agitated delirium   SUBJECTIVE:  Sedated on precedex   VITAL SIGNS: BP (!) 185/72   Pulse 80   Temp 98 F (36.7 C) (Oral)   Resp (!) 36   Ht 5\' 2"  (1.575 m)   Wt 82.1 kg (181 lb)   LMP  (LMP Unknown)   SpO2 99%   BMI 33.10 kg/m   HEMODYNAMICS:    VENTILATOR SETTINGS:    INTAKE / OUTPUT: I/O last 3 completed shifts: In: 3824 [I.V.:1434.8; NG/GT:1919.2; IV Piggyback:470] Out: 1865 [Urine:1865]  PHYSICAL EXAMINATION: General appearance:  71 Year old female, well nourished, sedated but will wake up Eyes: Anicteric sclerae, moist conjunctivae; PERRL, EOMI bilaterally. Mouth:  Membranes and no mucosal ulcerations; normal hard and soft palate Neck: Trachea midline; neck supple, no JVD Lungs/chest: faint basilar crackles, with normal respiratory effort and no intercostal retractions CV: regular irreg w/ CVR no MRGs  Abdomen: Soft, non-tender; no masses or HSM Extremities: No peripheral edema or extremity lymphadenopathy Skin: Normal temperature, turgor and texture; no rash, ulcers or subcutaneous nodules Neuro/Psych: sedated. When she wakes up agitated. Moves all extremities  LABS:  BMET  Recent Labs Lab 05/14/16 0850 05/15/16 0455 05/16/16 0402  NA 146* 144 146*  K 3.4* 4.3 3.9  CL 114* 114* 115*  CO2 27 27 26   BUN 20 21* 19  CREATININE 1.21* 1.03* 0.94  GLUCOSE 131* 115* 108*    Electrolytes  Recent Labs Lab 05/14/16 0850 05/14/16 1827 05/15/16 0455 05/15/16 1712 05/16/16 0402  CALCIUM 8.5*  --  8.5*  --  8.8*  MG 2.0 1.9 2.0 1.9  --   PHOS 1.8* 1.4* 1.6* 2.7  --     CBC  Recent Labs Lab 05/14/16 0850 05/15/16 0455 05/16/16 0402  WBC 11.8* 7.3 7.6  HGB 9.1* 8.6* 8.8*  HCT 28.1* 27.3* 27.6*  PLT 95* 71* 76*    Coag's No results for  input(s): APTT, INR in the last 168 hours.  Sepsis Markers  Recent Labs Lab 05/13/16 0906 05/13/16 1229  LATICACIDVEN 1.3 4.4*    ABG  Recent Labs Lab 05/11/16 2214 05/13/16 1530  PHART 7.443 7.402  PCO2ART 47.6 43.4  PO2ART BELOW REPORTABLE RANGE 93.0    Liver Enzymes  Recent Labs Lab 05/12/16 0340 05/14/16 0850 05/15/16 0455  AST 42* 76* 57*  ALT 10* 16 16  ALKPHOS 56 53 56  BILITOT 2.8* 2.3* 1.4*  ALBUMIN 3.4* 3.4* 3.0*    Cardiac Enzymes No results for input(s): TROPONINI, PROBNP in the last 168 hours.  Glucose  Recent Labs Lab 05/15/16 0805 05/15/16 1149 05/15/16 1643 05/15/16 2007 05/15/16 2346 05/16/16 0739  GLUCAP 133* 121* 106* 98 95 105*    Imaging Dg Abd 1 View  Result Date: 05/16/2016 CLINICAL DATA:  NG tube placement EXAM: ABDOMEN - 1 VIEW COMPARISON:  Abdominal radiograph 05/14/2016 FINDINGS: Weighted enteric tube tip overlies the gastric body. Nonobstructive bowel gas pattern. IMPRESSION: Weighted enteric tube tip overlying the gastric body. Electronically Signed   By: Deatra Robinson M.D.   On: 05/16/2016 00:57     STUDIES:  LP 3/14>>> abd [place] 3/14>>>1. Liver has an echogenic echotexture with irregular contour. Findings suggest cirrhosis. No focal hepatic abnormality identified. 2. Dilated common bile duct at 9.6 mm. No gallstones identified. MRCP can be  obtained to further evaluate CT head 3/12: neg for bleed US renal 3/14 left nephrolithiasis but no obstruction  CXR 3/14 no edema, CVL good position. No effusion (personally reviewed by PB) CXR 3/16: personally reviewed. LLL ATX.  CULTURES: UC 3/12: mult species  BC 3/13>>> UC 3/14>>>  ANTIBIOTICS: Acyclovir 3/13>>>3/15 Rocephin 3/13>>> vanc 3/13>>>3/15  SIGNIFICANT EVENTS:   LINES/TUBES: Right IJ CVL 3/14>>>  I reviewed CXR myself, pleural effusion noted  ASSESSMENT / PLAN:  Acute encephalopathy -favoring infection and either drug toxicity or WD superimposed on  dementia  ->spoke w/ neuro yesterday who agreed w/ our impression Plan Changed lorazepam to PRN, minimize, not sure if paradoxical reaction to benzos Dc precedex Add low dose Seroquel Cont lactulose.  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 Grade III diastolic HF Medication related hypotension Plan Cont lopressor Start low dose norvasc (now hypertensive) Cont tele NOT anticoagulation candidate MAP goal > 65  Mild AKI-->better Plan Avoid hypotension Renal dose meds F/u am chemistry Replace electrolytes as indicated  LLL ATX Pleural effusion Plan Limit sedation Pulm hygiene Wean O2 No thora  UTI-->F/U culture negative Plan  day 4/5 rocephin   Hypernatremia and hyperchloremia-->improved Plan No change in current water replacement F/u am chem  Hypophosphatemia Plan Replace Recheck in AM  Possible cirrhosis  Plan Cont lactulose  Out-pt eval   Aspiration risk Plan NPO Tube feeds Will need SLP  Anemia of critical illness w/out evidence of bleeding Plan  Start [place] heparin  f/u CBC  DVT prophylaxis:  SUP: na Diet: tubefeeds Activity: BR Disposition : ICU  Discussed with PCCM-NP  [facility] with [facility] off 3/18.  Alyson ReedyWesam G. Yasamin Karel, M.D. [facility]. Pager: [phone]. After hours pager: [phone].  05/16/2016, 10:18 AM

## 2016-05-17 ENCOUNTER — Inpatient Hospital Stay (HOSPITAL_COMMUNITY): Payer: Medicare Other

## 2016-05-17 DIAGNOSIS — I471 Supraventricular tachycardia: Secondary | ICD-10-CM

## 2016-05-17 LAB — CBC
HCT: 28.9 % — ABNORMAL LOW (ref 36.0–46.0)
Hemoglobin: 9.4 g/dL — ABNORMAL LOW (ref 12.0–15.0)
MCH: 32.5 pg (ref 26.0–34.0)
MCHC: 32.5 g/dL (ref 30.0–36.0)
MCV: 100 fL (ref 78.0–100.0)
Platelets: 83 10*3/uL — ABNORMAL LOW (ref 150–400)
RBC: 2.89 MIL/uL — ABNORMAL LOW (ref 3.87–5.11)
RDW: 16.3 % — ABNORMAL HIGH (ref 11.5–15.5)
WBC: 10.1 10*3/uL (ref 4.0–10.5)

## 2016-05-17 LAB — BASIC METABOLIC PANEL
Anion gap: 4 — ABNORMAL LOW (ref 5–15)
BUN: 16 mg/dL (ref 6–20)
CO2: 29 mmol/L (ref 22–32)
Calcium: 9.2 mg/dL (ref 8.9–10.3)
Chloride: 112 mmol/L — ABNORMAL HIGH (ref 101–111)
Creatinine, Ser: 0.75 mg/dL (ref 0.44–1.00)
GFR calc Af Amer: >60 mL/min (ref >60)
GFR calc non Af Amer: >60 mL/min (ref >60)
Glucose, Bld: 124 mg/dL — ABNORMAL HIGH (ref 65–99)
Potassium: 3.7 mmol/L (ref 3.5–5.1)
Sodium: 145 mmol/L (ref 135–145)

## 2016-05-17 LAB — GLUCOSE, CAPILLARY
Glucose-Capillary: 115 mg/dL — ABNORMAL HIGH (ref 65–99)
Glucose-Capillary: 140 mg/dL — ABNORMAL HIGH (ref 65–99)
Glucose-Capillary: 152 mg/dL — ABNORMAL HIGH (ref 65–99)
Glucose-Capillary: 152 mg/dL — ABNORMAL HIGH (ref 65–99)
Glucose-Capillary: 173 mg/dL — ABNORMAL HIGH (ref 65–99)
Glucose-Capillary: 175 mg/dL — ABNORMAL HIGH (ref 65–99)

## 2016-05-17 LAB — PHOSPHORUS: Phosphorus: 2.6 mg/dL (ref 2.5–4.6)

## 2016-05-17 LAB — CULTURE, BLOOD (ROUTINE X 2)
Culture: NO GROWTH
Culture: NO GROWTH

## 2016-05-17 LAB — ECHOCARDIOGRAM COMPLETE
Height: 62 in
Weight: 2903.02 oz

## 2016-05-17 LAB — MAGNESIUM: Magnesium: 2.1 mg/dL (ref 1.7–2.4)

## 2016-05-17 LAB — TROPONIN I
Troponin I: 0.19 ng/mL (ref <0.03)
Troponin I: 0.18 ng/mL (ref <0.03)

## 2016-05-17 MED ORDER — FUROSEMIDE 10 MG/ML IJ SOLN
20.0000 mg | Freq: Two times a day (BID) | INTRAMUSCULAR | Status: DC
  Administered 2016-05-17 – 2016-05-18 (×2): 20 mg via INTRAVENOUS
  Filled 2016-05-17 (×2): qty 2

## 2016-05-17 MED ORDER — METOPROLOL TARTRATE 25 MG/10 ML ORAL SUSPENSION
50.0000 mg | Freq: Two times a day (BID) | ORAL | Status: DC
  Administered 2016-05-17 – 2016-05-18 (×4): 50 mg
  Filled 2016-05-17 (×5): qty 20

## 2016-05-17 MED ORDER — POTASSIUM CHLORIDE 20 MEQ/15ML (10%) PO SOLN
40.0000 meq | Freq: Once | ORAL | Status: AC
  Administered 2016-05-17: 40 meq
  Filled 2016-05-17: qty 30

## 2016-05-17 MED ORDER — POTASSIUM CHLORIDE CRYS ER 20 MEQ PO TBCR: 40.0000 meq | EXTENDED_RELEASE_TABLET | Freq: Once | ORAL | Status: DC

## 2016-05-17 NOTE — Progress Notes (Addendum)
Progress Note  Patient Name: Gabrielle [name] Date of Encounter: 05/17/2016  Primary Cardiologist: Dr Anne Fu  Subjective   Very lethargic this am.  She is currently getting a breathing treatment  Inpatient Medications    Scheduled Meds: . amLODipine  5 mg Oral Daily  . aspirin  81 mg Per Tube Daily  . budesonide (PULMICORT) nebulizer solution  0.5 mg Nebulization BID  . chlorhexidine  15 mL Mouth Rinse BID  . Chlorhexidine Gluconate Cloth  6 each Topical Q0600  . feeding supplement (PRO-STAT SUGAR FREE 64)  30 mL Per Tube Daily  . fluticasone  2 spray Each Nare Daily  . free water  200 mL Per Tube Q4H  . heparin subcutaneous  5,000 Units Subcutaneous Q8H  . ipratropium  0.5 mg Nebulization TID  . lactulose  30 g Per Tube BID  . levalbuterol  0.63 mg Nebulization TID  . mouth rinse  15 mL Mouth Rinse BID  . mouth rinse  15 mL Mouth Rinse q12n4p  . metoprolol tartrate  25 mg Per Tube BID  . QUEtiapine  25 mg Per Tube BID  . thiamine  100 mg Per Tube Daily   Continuous Infusions: . dextrose 10 mL/hr at 05/17/16 0600  . feeding supplement (VITAL AF 1.2 CAL) Stopped (05/17/16 0000)  . phenylephrine (NEO-SYNEPHRINE) Adult infusion Stopped (05/16/16 0115)   PRN Meds: acetaminophen **OR** acetaminophen, levalbuterol, LORazepam, ondansetron (ZOFRAN) IV   Vital Signs    Vitals:   05/17/16 0417 05/17/16 0500 05/17/16 0600 05/17/16 0700  BP:  (!) 149/46 (!) 167/68 (!) 163/73  Pulse:      Resp:  (!) 27 (!) 34 (!) 31  Temp:  99 F (37.2 C)    TempSrc:      SpO2:  95%    Weight: 181 lb 7 oz (82.3 kg)     Height:        Intake/Output Summary (Last 24 hours) at 05/17/16 0906 Last data filed at 05/17/16 0600  Gross per 24 hour  Intake             1460 ml  Output             1850 ml  Net             -390 ml   Filed Weights   05/15/16 0500 05/16/16 0500 05/17/16 0417  Weight: 188 lb 0.8 oz (85.3 kg) 181 lb (82.1 kg) 181 lb 7 oz (82.3 kg)    Telemetry    NSR at  68bpm- Personally Reviewed  ECG    AF with RVR- Personally Reviewed  Physical Exam   ZOX:WRUEA female, dose not open her eyes but answers questions appropriately Neck: No obvious JVD, thick neck Cardiac: RRR no murmurs, rubs, or gallops.  Respiratory: decreased breath sounds, scattered rhonchi anteriorly GI: obese, soft, non-distended  MS: No edema; No deformity. Neuro: answers questions appropriately   Labs    Chemistry  Recent Labs Lab 05/12/16 0340  05/14/16 0850 05/15/16 0455 05/16/16 0402 05/16/16 1717 05/17/16 0354  NA 146*  < > 146* 144 146* 144 145  K 3.0*  < > 3.4* 4.3 3.9 3.8 3.7  CL 107  < > 114* 114* 115* 110 112*  CO2 32  < > 27 27 26 28 29   GLUCOSE 126*  < > 131* 115* 108* 169* 124*  BUN 11  < > 20 21* 19 19 16   CREATININE 1.11*  < > 1.21* 1.03* 0.94 0.89  0.75  CALCIUM 8.6*  < > 8.5* 8.5* 8.8* 9.2 9.2  PROT 5.7*  --  5.7* 5.3*  --   --   --   ALBUMIN 3.4*  --  3.4* 3.0*  --   --   --   AST 42*  --  76* 57*  --   --   --   ALT 10*  --  16 16  --   --   --   ALKPHOS 56  --  53 56  --   --   --   BILITOT 2.8*  --  2.3* 1.4*  --   --   --   GFRNONAA 48*  < > 44* 53* 59* >60 >60  GFRAA 56*  < > 51* >60 >60 >60 >60  ANIONGAP 7  < > 5 3* 5 6 4*  < > = values in this interval not displayed.   Hematology  Recent Labs Lab 05/15/16 0455 05/16/16 0402 05/17/16 0354  WBC 7.3 7.6 10.1  RBC 2.73* 2.75* 2.89*  HGB 8.6* 8.8* 9.4*  HCT 27.3* 27.6* 28.9*  MCV 100.0 100.4* 100.0  MCH 31.5 32.0 32.5  MCHC 31.5 31.9 32.5  RDW 16.2* 16.2* 16.3*  PLT 71* 76* 83*     Radiology    Dg Abd 1 View  Result Date: 05/16/2016 CLINICAL DATA:  NG tube placement EXAM: ABDOMEN - 1 VIEW COMPARISON:  Abdominal radiograph 05/14/2016 FINDINGS: Weighted enteric tube tip overlies the gastric body. Nonobstructive bowel gas pattern. IMPRESSION: Weighted enteric tube tip overlying the gastric body. Electronically Signed   By: Deatra RobinsonKevin  Herman M.D.   On: 05/16/2016 00:57   Dg Abd  Portable 1v  Result Date: 05/17/2016 CLINICAL DATA:  Nasogastric tube placement. EXAM: PORTABLE ABDOMEN - 1 VIEW COMPARISON:  05/16/2016 FINDINGS: Enteric tube is present with tip over the stomach in the midline upper abdomen likely in distal stomach. Bowel gas pattern is nonobstructive. Remainder of the exam is unchanged. IMPRESSION: Nonobstructive bowel gas pattern. Nasogastric tube with tip over the midline upper abdomen likely in the distal stomach. Electronically Signed   By: Elberta Fortisaniel  Boyle M.D.   On: 05/17/2016 08:06   Dg Abd Portable 1v  Result Date: 05/16/2016 CLINICAL DATA:  Nasogastric tube placement. EXAM: PORTABLE ABDOMEN - 1 VIEW COMPARISON:  Earlier today. FINDINGS: Feeding tube tip in the proximal stomach. No nasogastric tube is seen. Stable cardiac pacemaker leads. No dilated bowel loops. Lumbar spine degenerative changes. IMPRESSION: Feeding tube tip in the proximal stomach.  No nasogastric tube seen. Electronically Signed   By: Beckie SaltsSteven  Reid M.D.   On: 05/16/2016 14:12    Cardiac Studies   Echo 12/16/16- Study Conclusions  - Left ventricle: The cavity size was moderately dilated. Systolic   function was normal. The estimated ejection fraction was in the   range of 55% to 60%. Wall motion was normal; there were no   regional wall motion abnormalities. There was a reduced   contribution of atrial contraction to ventricular filling, due to   increased ventricular diastolic pressure or atrial contractile   dysfunction. Doppler parameters are consistent with a reversible   restrictive pattern, indicative of decreased left ventricular   diastolic compliance and/or increased left atrial pressure (grade   3 diastolic dysfunction). Doppler parameters are consistent with   high ventricular filling pressure. - Mitral valve: Calcified annulus. The findings are consistent with   mild stenosis. There was mild to moderate regurgitation directed   eccentrically and posteriorly. Mean  gradient (D): 3 mm Hg. Valve   area by pressure half-time: 1.42 cm^2. Valve area by continuity   equation (using LVOT flow): 1.6 cm^2. - Left atrium: The atrium was severely dilated. - Pulmonic valve: There was trivial regurgitation. - Pulmonary arteries: PA peak pressure: 57 mm Hg (S).  Impressions:  - The right ventricular systolic pressure was increased consistent   with moderate pulmonary hypertension.  Patient Profile     71 y.o. 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 She was brought to the ED at [facility] 05/11/16 by the police after the family called concerned about increasing confusion and paranoid behavior. We were asked to see for tachycardia. The pt was completely uncooperative and combative and required IV Precedex for sedation so we could control her HR.   Assessment & Plan    AF with RVR She is now in NSR on PO Lopressor. Will increase lopressor to 50mg  BID since BP also high.  Mental status change- Work up per primary team. She apparently has some baseline dementia but had been living alone in her own home.   CAD- S/P CABG in 2010. Echo in Oct 2017 showed her EF to be 60-65%, grade 3 DD, moderate pulmonary HTN.  She has not had any anginal symptoms.   HTN  BP still elevated despite adding amlodipine yesterday.  This may take a few days.  Will increase lopressor to 50mg  BID for BP as well as suppression of further PAF.  PTVDP-  BS pacemaker implanted Oct 2017   Anticoagulation- She had GI bleeding in 2017 but recently was cleared to resume Eliquis (see Dr Anne Fu OV 04/28/16). It's unclear if she ever did this. She is a CHADs VASc-4.  Her Hbg is 9.4 and per CCM note she is felt to not be a candidate for anticoagulation at least at present.  Elevated Troponin with flat trend Likely related to acute illness and rapid afib.  Her  trend is flat so suspect related to demand ischemia. Will check 2D echo to assess LVF.  This was normal by echo 12/2015.  Signed, Armanda Magic, MD  05/17/2016, 9:06 AM

## 2016-05-17 NOTE — Progress Notes (Signed)
Pt removed feeding tube, paused tube feeds and scheduled free water until reinserted and placement confirmed.  Will continue to monitor.

## 2016-05-17 NOTE — Progress Notes (Signed)
PROGRESS NOTE    Gabrielle [name]  ZOX:[id number] DOB: 21-May-1945 DOA: 05/11/2016 PCP: Irving Copas, MD    Brief Narrative: 71 y.o.femalewith history of CAD status post CABG, diastolic CHF, hypertension, atrial fibrillation not on anticoagulation secondary to severe GI bleed, tachybradycardia syndrome status post pacemaker placement was brought to the ER secondary to confusion. Patient admitted with AMS, confusion, agitation. Subsequently develops SVT. She didn't respond to haldol, or ativan. She was treated with precex gtt. She became hypotensive from medications and was started on pressors. There was consideration regarding meningitis but after neurology evaluation, meningitis was less likely. EEG negative for seizure. The reason for AMS is unclear, could be related to medications effect, unclear what medications she was taking, Infection UTI.     Assessment & Plan:   Principal Problem:   Acute encephalopathy Active Problems:   Essential hypertension   PAF (paroxysmal atrial fibrillation) (HCC)   Tachy-brady syndrome (HCC)   Hypokalemia   Encounter for central line care   Acute renal failure (ARF) (HCC)   Protein-calorie malnutrition, severe    Acute encephalopathy; Might be related to infection, UTI vs drug toxicity vs WD superimposed dementia.  Patient was on precedex Gtt, which help with agitation.  She has been off precedex. Started on Seroquel.  Ativan PRN.  sleepy this morning, but was able to tell me her name and age.  Continue with lactulose.   Hypotension;  Related to medication, precedex. She was on pressors.  Resolved.   Acute Hypoxic respiratory failure.  Positive 8 L.  IV lasix.  Chest x ray.   UTI; treated with IV ceftriaxoen  -Paroxysmal atrial fibrillation, SVT; -Had two episode of SVT , patient was noticed to be hypoxic prior to one of the episodes.  -not on anticoagulation given hx of severe GI bleed -CHADsVASC score 4 -monitor on  telemetry  On oral metoprolol.  Cardiology following.   -Hypokalemia and low magnesium  -resolved.   -AKI: improved.  -hypovolemia. Infection.  Improved.  [place]; left nephrolithiasis, no obstruction.   -hx of diastolic heart failure -stable and compensated -will follow daily weight and strict intake/output  -Hypernatremia -resolved with IV fluids.   -Cirrhosis. Transaminases;  Outpatient follow up.  Continue with lactulose.  US liver; finding suggest cirrhosis.      DVT prophylaxis: Heparin  Code Status:  DNR Family Communication: none at bedside.  Disposition Plan: remain in the step down unit   Consultants:   CCM  Neurology  Cardiology    Procedures:  EEG    Antimicrobials:  Ceftriaxone   Subjective: Lethargic, but open eyes, able to tell me her name and her age.   Objective: Vitals:   05/17/16 0400 05/17/16 0417 05/17/16 0500 05/17/16 0600  BP: (!) 149/58  (!) 149/46 (!) 167/68  Pulse:      Resp: (!) 29  (!) 27 (!) 34  Temp:   99 F (37.2 C)   TempSrc:      SpO2: 95%  95%   Weight:  82.3 kg (181 lb 7 oz)    Height:        Intake/Output Summary (Last 24 hours) at 05/17/16 0751 Last data filed at 05/17/16 0600  Gross per 24 hour  Intake             1779 ml  Output             1850 ml  Net              -71  ml   Filed Weights   05/15/16 0500 05/16/16 0500 05/17/16 0417  Weight: 85.3 kg (188 lb 0.8 oz) 82.1 kg (181 lb) 82.3 kg (181 lb 7 oz)    Examination:  General exam: lethargic Respiratory system: Clear to auscultation. Respiratory effort normal. Cardiovascular system: S 1. S 2 IRR Gastrointestinal system: Abdomen is nondistended, soft and nontender. No organomegaly or masses felt. Normal bowel sounds heard. Central nervous system: Lethargic Extremities: Symmetric 5 x 5 power. Skin: No rashes, lesions or ulcers Psychiatry: lethargic    Data Reviewed: I have personally reviewed following labs and imaging  studies  CBC:  Recent Labs Lab 05/11/16 2032  05/13/16 0322 05/14/16 0850 05/15/16 0455 05/16/16 0402 05/17/16 0354  WBC 11.5*  < > 10.6* 11.8* 7.3 7.6 10.1  NEUTROABS 7.2  --   --   --   --   --   --   HGB 10.1*  < > 8.7* 9.1* 8.6* 8.8* 9.4*  HCT 30.7*  < > 27.1* 28.1* 27.3* 27.6* 28.9*  MCV 98.4  < > 98.2 100.4* 100.0 100.4* 100.0  PLT 123*  < > 86* 95* 71* 76* 83*  < > = values in this interval not displayed. Basic Metabolic Panel:  Recent Labs Lab 05/14/16 0850 05/14/16 1827 05/15/16 0455 05/15/16 1712 05/16/16 0402 05/16/16 1717 05/17/16 0354  NA 146*  --  144  --  146* 144 145  K 3.4*  --  4.3  --  3.9 3.8 3.7  CL 114*  --  114*  --  115* 110 112*  CO2 27  --  27  --  26 28 29   GLUCOSE 131*  --  115*  --  108* 169* 124*  BUN 20  --  21*  --  19 19 16   CREATININE 1.21*  --  1.03*  --  0.94 0.89 0.75  CALCIUM 8.5*  --  8.5*  --  8.8* 9.2 9.2  MG 2.0 1.9 2.0 1.9  --  1.9 2.1  PHOS 1.8* 1.4* 1.6* 2.7  --   --  2.6   GFR: Estimated Creatinine Clearance: 63.2 mL/min (by C-G formula based on SCr of 0.75 mg/dL). Liver Function Tests:  Recent Labs Lab 05/11/16 2032 05/12/16 0340 05/14/16 0850 05/15/16 0455  AST 46* 42* 76* 57*  ALT 11* 10* 16 16  ALKPHOS 64 56 53 56  BILITOT 2.5* 2.8* 2.3* 1.4*  PROT 6.6 5.7* 5.7* 5.3*  ALBUMIN 4.0 3.4* 3.4* 3.0*   No results for input(s): LIPASE, AMYLASE in the last 168 hours.  Recent Labs Lab 05/11/16 2032 05/12/16 0731 05/13/16 1415 05/15/16 0455  AMMONIA 41* 31 60* 37*   Coagulation Profile: No results for input(s): INR, PROTIME in the last 168 hours. Cardiac Enzymes:  Recent Labs Lab 05/16/16 1717 05/17/16 0030 05/17/16 0354  TROPONINI 0.20* 0.19* 0.18*   BNP (last 3 results) No results for input(s): PROBNP in the last 8760 hours. HbA1C: No results for input(s): HGBA1C in the last 72 hours. CBG:  Recent Labs Lab 05/16/16 0739 05/16/16 1141 05/16/16 1556 05/16/16 1944 05/17/16 0044  GLUCAP  105* 180* 166* 166* 152*   Lipid Profile: No results for input(s): CHOL, HDL, LDLCALC, TRIG, CHOLHDL, LDLDIRECT in the last 72 hours. Thyroid Function Tests: No results for input(s): TSH, T4TOTAL, FREET4, T3FREE, THYROIDAB in the last 72 hours. Anemia Panel: No results for input(s): VITAMINB12, FOLATE, FERRITIN, TIBC, IRON, RETICCTPCT in the last 72 hours. Sepsis Labs:  Recent Labs Lab 05/13/16 603 140 [id number]  05/13/16 1229  LATICACIDVEN 1.3 4.4*    Recent Results (from the past 240 hour(s))  Urine culture     Status: Abnormal   Collection Time: 05/11/16  8:25 PM  Result Value Ref Range Status   Specimen Description URINE, RANDOM  Final   Special Requests NONE  Final   Culture MULTIPLE SPECIES PRESENT, SUGGEST RECOLLECTION (A)  Final   Report Status 05/13/2016 FINAL  Final  MRSA PCR Screening     Status: None   Collection Time: 05/12/16 12:24 AM  Result Value Ref Range Status   MRSA by PCR NEGATIVE NEGATIVE Final    Comment:        The GeneXpert MRSA Assay (FDA approved for NASAL specimens only), is one component of a comprehensive MRSA colonization surveillance program. It is not intended to diagnose MRSA infection nor to guide or monitor treatment for MRSA infections.   Culture, blood (Routine X 2) w Reflex to ID Panel     Status: None (Preliminary result)   Collection Time: 05/12/16  8:45 AM  Result Value Ref Range Status   Specimen Description BLOOD RIGHT ARM  Final   Special Requests BOTTLES DRAWN AEROBIC ONLY 5CC  Final   Culture   Final    NO GROWTH 4 DAYS Performed at [facility], [address] [place], [place] [address]    Report Status PENDING  Incomplete  Culture, blood (Routine X 2) w Reflex to ID Panel     Status: None (Preliminary result)   Collection Time: 05/12/16  8:46 AM  Result Value Ref Range Status   Specimen Description BLOOD RIGHT HAND  Final   Special Requests IN PEDIATRIC BOTTLE 1.5CC  Final   Culture   Final    NO GROWTH 4  DAYS Performed at [facility], [address] [place], [place] [address]    Report Status PENDING  Incomplete  Culture, Urine     Status: None   Collection Time: 05/13/16 10:20 PM  Result Value Ref Range Status   Specimen Description URINE, RANDOM  Final   Special Requests NONE  Final   Culture   Final    NO GROWTH Performed at [facility], [address] [place], [place] [address]    Report Status 05/15/2016 FINAL  Final  Culture, Urine     Status: None   Collection Time: 05/14/16 10:00 AM  Result Value Ref Range Status   Specimen Description URINE, CATHETERIZED  Final   Special Requests NONE  Final   Culture   Final    NO GROWTH Performed at [facility], [address], [place], [place] [address]    Report Status 05/15/2016 FINAL  Final         Radiology Studies: Dg Abd 1 View  Result Date: 05/16/2016 CLINICAL DATA:  NG tube placement EXAM: ABDOMEN - 1 VIEW COMPARISON:  Abdominal radiograph 05/14/2016 FINDINGS: Weighted enteric tube tip overlies the gastric body. Nonobstructive bowel gas pattern. IMPRESSION: Weighted enteric tube tip overlying the gastric body. Electronically Signed   By: Deatra Robinson M.D.   On: 05/16/2016 00:57   Dg Abd Portable 1v  Result Date: 05/16/2016 CLINICAL DATA:  Nasogastric tube placement. EXAM: PORTABLE ABDOMEN - 1 VIEW COMPARISON:  Earlier today. FINDINGS: Feeding tube tip in the proximal stomach. No nasogastric tube is seen. Stable cardiac pacemaker leads. No dilated bowel loops. Lumbar spine degenerative changes. IMPRESSION: Feeding tube tip in the proximal stomach.  No nasogastric tube  seen. Electronically Signed   By: Beckie Salts M.D.   On: 05/16/2016 14:12        Scheduled Meds: . amLODipine  5 mg Oral Daily  . aspirin  81 mg Per Tube Daily  . budesonide (PULMICORT) nebulizer solution  0.5 mg Nebulization BID  . chlorhexidine  15 mL Mouth Rinse BID  . Chlorhexidine Gluconate Cloth  6 each Topical Q0600  .  feeding supplement (PRO-STAT SUGAR FREE 64)  30 mL Per Tube Daily  . fluticasone  2 spray Each Nare Daily  . free water  200 mL Per Tube Q4H  . heparin subcutaneous  5,000 Units Subcutaneous Q8H  . ipratropium  0.5 mg Nebulization TID  . lactulose  30 g Per Tube BID  . levalbuterol  0.63 mg Nebulization TID  . mouth rinse  15 mL Mouth Rinse BID  . mouth rinse  15 mL Mouth Rinse q12n4p  . metoprolol tartrate  25 mg Per Tube BID  . QUEtiapine  25 mg Per Tube BID  . thiamine  100 mg Per Tube Daily   Continuous Infusions: . dextrose 10 mL/hr at 05/17/16 0600  . feeding supplement (VITAL AF 1.2 CAL) Stopped (05/17/16 0000)  . phenylephrine (NEO-SYNEPHRINE) Adult infusion Stopped (05/16/16 0115)     LOS: 5 days    Time spent: 35 minutes.     Alba Cory, MD [facility] Pager [phone]  If 7PM-7AM, please contact night-coverage [email or web address] Password [facility] 05/17/2016, 7:51 AM

## 2016-05-17 NOTE — Evaluation (Signed)
Clinical/Bedside Swallow Evaluation Patient Details  Name: Roderic PalauVirginia W Johal MRN: [id number] Date of Birth: 1945-09-09  Today's Date: 05/17/2016 Time: SLP Start Time (ACUTE ONLY): 1343 SLP Stop Time (ACUTE ONLY): 1353 SLP Time Calculation (min) (ACUTE ONLY): 10 min  Past Medical History:  Past Medical History:  Diagnosis Date  . Anemia   . Arthritis   . Asthma   . Atrial fibrillation with controlled ventricular response (HCC) 11/2009   a. not on anticoagulation due to recurrent GI bleeding while on Eliquis.  . Chronic bronchitis   . Chronic diastolic CHF (congestive heart failure) (HCC)    a. Echo 12/2015: EF 55-60% w/ Grade 3 DD  . Chronic headache   . COPD (chronic obstructive pulmonary disease) (HCC)   . Depression   . Heart attack   . Hyperlipidemia   . Hypertension    Past Surgical History:  Past Surgical History:  Procedure Laterality Date  . CAD-CABG     x7, brief post-op Atrial Fib  . CESAREAN SECTION     x2  . COLONOSCOPY WITH PROPOFOL N/A 10/24/2015   Procedure: COLONOSCOPY WITH PROPOFOL;  Surgeon: Carman ChingJames Edwards, MD;  [facility];  Service: Endoscopy;  Laterality: N/A;  . CORONARY ARTERY BYPASS GRAFT  2011  . EP IMPLANTABLE DEVICE N/A 12/26/2015   Procedure: Pacemaker Implant;  Surgeon: Marinus MawGregg W Taylor, MD;  [facility];  Service: Cardiovascular;  Laterality: N/A;  . ESOPHAGOGASTRODUODENOSCOPY N/A 10/22/2015   Procedure: ESOPHAGOGASTRODUODENOSCOPY (EGD);  Surgeon: Carman ChingJames Edwards, MD;  [facility];  Service: Endoscopy;  Laterality: N/A;  . ESOPHAGOGASTRODUODENOSCOPY N/A 12/24/2015   Procedure: ESOPHAGOGASTRODUODENOSCOPY (EGD);  Surgeon: Carman ChingJames Edwards, MD;  [facility];  Service: Endoscopy;  Laterality: N/A;  . JOINT REPLACEMENT Bilateral   . TONSILLECTOMY    . TOTAL KNEE ARTHROPLASTY     HPI:  71 year old female admitted with acute encephalopathy; favoring infection and either drug toxicity or WD superimposed on  dementia.  Seen at bedside 12/2015 and without evidence of oropharyneal dysphagia but with esophageal deficits noted. Dx with severe esophagitis, had EGD and barium swallow and was recommended to slowly advance to soft solids.    Assessment / Plan / Recommendation Clinical Impression  Oropharyngeal swallow appearing functional with only minimal s/s of aspiration noted s/p intake of thin liquids. Mentation however, with significant lethargy, arousing for only brief (5-10 seconds) periods of time, does not allow for safe po intake. Esophageal history further increases risk for dysphagia/aspiration. Recommend contiued NPO. Prognosis for ability to resume pos good with improved alertness.  SLP Visit Diagnosis: Dysphagia, unspecified (R13.10)    Aspiration Risk  Moderate aspiration risk    Diet Recommendation NPO   Medication Administration: Via alternative means    Other  Recommendations Oral Care Recommendations: Oral care QID   Follow up Recommendations None      Frequency and Duration min 2x/week  2 weeks       Prognosis Prognosis for Safe Diet Advancement: Good Barriers to Reach Goals: Cognitive deficits      Swallow Study   General HPI: 71 year old female admitted with acute encephalopathy; favoring infection and either drug toxicity or WD superimposed on dementia.  Seen at bedside 12/2015 and without evidence of oropharyneal dysphagia but with esophageal deficits noted. Dx with severe esophagitis, had EGD and barium swallow and was recommended to slowly advance to soft solids.  Type of Study: Bedside Swallow Evaluation Previous Swallow Assessment: none Diet Prior to this Study: NPO;NG Tube  Temperature Spikes Noted: No Respiratory Status: Nasal cannula History of Recent Intubation: No Behavior/Cognition: Lethargic/Drowsy;Requires cueing Oral Cavity Assessment: Within Functional Limits Oral Care Completed by SLP: Yes Oral Cavity - Dentition: Edentulous Vision: Functional for  self-feeding Self-Feeding Abilities: Needs assist (due to AMS) Patient Positioning: Upright in bed Baseline Vocal Quality: Low vocal intensity Volitional Cough: Cognitively unable to elicit Volitional Swallow: Unable to elicit    Oral/Motor/Sensory Function Overall Oral Motor/Sensory Function: Generalized oral weakness   Ice Chips Ice chips: Impaired Presentation: Spoon Oral Phase Impairments: Impaired mastication Oral Phase Functional Implications: Prolonged oral transit;Oral holding (expectoration of bolus)   Thin Liquid Thin Liquid: Impaired Presentation: Straw Pharyngeal  Phase Impairments: Cough - Delayed    Nectar Thick Nectar Thick Liquid: Not tested   Honey Thick Honey Thick Liquid: Not tested   Puree Puree: Within functional limits   Solid   Ferdinand Lango MA, CCC-SLP [phone]    Solid: Not tested        Coulton Schlink Meryl 05/17/2016,1:58 PM

## 2016-05-18 DIAGNOSIS — I482 Chronic atrial fibrillation: Secondary | ICD-10-CM

## 2016-05-18 DIAGNOSIS — Z8719 Personal history of other diseases of the digestive system: Secondary | ICD-10-CM

## 2016-05-18 DIAGNOSIS — R410 Disorientation, unspecified: Secondary | ICD-10-CM

## 2016-05-18 LAB — CBC
HCT: 29.5 % — ABNORMAL LOW (ref 36.0–46.0)
Hemoglobin: 9.5 g/dL — ABNORMAL LOW (ref 12.0–15.0)
MCH: 31.8 pg (ref 26.0–34.0)
MCHC: 32.2 g/dL (ref 30.0–36.0)
MCV: 98.7 fL (ref 78.0–100.0)
Platelets: 95 K/uL — ABNORMAL LOW (ref 150–400)
RBC: 2.99 MIL/uL — ABNORMAL LOW (ref 3.87–5.11)
RDW: 16.3 % — ABNORMAL HIGH (ref 11.5–15.5)
WBC: 10.6 K/uL — ABNORMAL HIGH (ref 4.0–10.5)

## 2016-05-18 LAB — BASIC METABOLIC PANEL WITH GFR
Anion gap: 3 — ABNORMAL LOW (ref 5–15)
BUN: 13 mg/dL (ref 6–20)
CO2: 33 mmol/L — ABNORMAL HIGH (ref 22–32)
Calcium: 9.4 mg/dL (ref 8.9–10.3)
Chloride: 105 mmol/L (ref 101–111)
Creatinine, Ser: 0.72 mg/dL (ref 0.44–1.00)
GFR calc Af Amer: >60 mL/min
GFR calc non Af Amer: >60 mL/min
Glucose, Bld: 138 mg/dL — ABNORMAL HIGH (ref 65–99)
Potassium: 4.1 mmol/L (ref 3.5–5.1)
Sodium: 141 mmol/L (ref 135–145)

## 2016-05-18 LAB — GLUCOSE, CAPILLARY
Glucose-Capillary: 114 mg/dL — ABNORMAL HIGH (ref 65–99)
Glucose-Capillary: 120 mg/dL — ABNORMAL HIGH (ref 65–99)
Glucose-Capillary: 140 mg/dL — ABNORMAL HIGH (ref 65–99)
Glucose-Capillary: 144 mg/dL — ABNORMAL HIGH (ref 65–99)
Glucose-Capillary: 149 mg/dL — ABNORMAL HIGH (ref 65–99)
Glucose-Capillary: 94 mg/dL (ref 65–99)

## 2016-05-18 LAB — MAGNESIUM: Magnesium: 1.9 mg/dL (ref 1.7–2.4)

## 2016-05-18 MED ORDER — QUETIAPINE FUMARATE 25 MG PO TABS
25.0000 mg | ORAL_TABLET | Freq: Every day | ORAL | Status: DC
Start: 2016-05-18 — End: 2016-05-21
  Administered 2016-05-18 – 2016-05-20 (×3): 25 mg
  Filled 2016-05-18 (×3): qty 1

## 2016-05-18 MED ORDER — MAGNESIUM SULFATE 2 GM/50ML IV SOLN
2.0000 g | Freq: Once | INTRAVENOUS | Status: AC
  Administered 2016-05-18: 2 g via INTRAVENOUS
  Filled 2016-05-18: qty 50

## 2016-05-18 MED ORDER — HYDROCODONE-ACETAMINOPHEN 5-325 MG PO TABS
1.0000 | ORAL_TABLET | Freq: Four times a day (QID) | ORAL | Status: AC | PRN
  Administered 2016-05-18 – 2016-05-19 (×2): 1 via ORAL
  Filled 2016-05-18 (×2): qty 1

## 2016-05-18 NOTE — Progress Notes (Signed)
PROGRESS NOTE    Gabrielle [name]  WRU:[id number] DOB: 18-Mar-1945 DOA: 05/11/2016 PCP: Irving Copas, MD    Brief Narrative: 71 y.o.femalewith history of CAD status post CABG, diastolic CHF, hypertension, atrial fibrillation not on anticoagulation secondary to severe GI bleed, tachybradycardia syndrome status post pacemaker placement was brought to the ER secondary to confusion. Patient admitted with AMS, confusion, agitation. Subsequently develops SVT. She didn't respond to haldol, or ativan. She was treated with precex gtt. She became hypotensive from medications and was started on pressors. There was consideration regarding meningitis but after neurology evaluation, meningitis was less likely. EEG negative for seizure. The reason for AMS is unclear, could be related to medications effect, unclear what medications she was taking, Infection UTI.     Assessment & Plan:   Principal Problem:   Acute encephalopathy Active Problems:   Essential hypertension   PAF (paroxysmal atrial fibrillation) (HCC)   Tachy-brady syndrome (HCC)   Hypokalemia   Encounter for central line care   Acute renal failure (ARF) (HCC)   Protein-calorie malnutrition, severe    Acute encephalopathy; Might be related to infection, UTI vs drug toxicity vs WD superimposed dementia.  Patient was on precedex Gtt, which help with agitation.  She has been off precedex. Started on Seroquel.  Ativan PRN.  Continue with lactulose.  She has been lethargic, will reduce Seroquel to once  A day.  Continue with support care.   Hypotension;  Related to medication, precedex. She was on pressors.  Resolved.   Acute Hypoxic respiratory failure.  She was on 10 L of oxygen, requiring less amount of oxygen this morning at 3 L.  Positive 8 L.  Received two doses of IV lasix 3-18. One more dose today.  Chest x ray bilateral effusion.    UTI; treated with IV ceftriaxoen  -Paroxysmal atrial fibrillation,  SVT; -Had two episode of SVT , patient was noticed to be hypoxic prior to one of the episodes.  -not on anticoagulation given hx of severe GI bleed -CHADsVASC score 4 -monitor on telemetry  On oral metoprolol.  Cardiology following.   -Hypokalemia and low magnesium  -resolved.   -AKI: improved.  -hypovolemia. Infection.  Improved.  [place]; left nephrolithiasis, no obstruction.   Acute on chronic diastolic heart failure -IV lasix,   -Hypernatremia -resolved with IV fluids.   -Cirrhosis. Transaminases;  Outpatient follow up.  Continue with lactulose.  US liver; finding suggest cirrhosis.      DVT prophylaxis: Heparin  Code Status:  DNR Family Communication: none at bedside.  Disposition Plan: remain in the step down unit   Consultants:   CCM  Neurology  Cardiology    Procedures:  EEG    Antimicrobials:  Ceftriaxone   Subjective: Lethargic, but open eyes, answer some questions. Oriented times 2.  Denies pain. Feeling weak , tired.   Objective: Vitals:   05/18/16 0500 05/18/16 0600 05/18/16 0700 05/18/16 0703  BP: (!) 134/51 (!) 148/53 (!) 139/54 (!) 139/54  Pulse: 65 68 66 64  Resp: (!) 23 18 (!) 24 (!) 24  Temp:    97.8 F (36.6 C)  TempSrc:    Oral  SpO2: 100% 100% 100% 100%  Weight: 78.7 kg (173 lb 8 oz)     Height:        Intake/Output Summary (Last 24 hours) at 05/18/16 9147 Last data filed at 05/18/16 0800  Gross per 24 hour  Intake  540 ml  Output             3600 ml  Net            -3060 ml   Filed Weights   05/16/16 0500 05/17/16 0417 05/18/16 0500  Weight: 82.1 kg (181 lb) 82.3 kg (181 lb 7 oz) 78.7 kg (173 lb 8 oz)    Examination:  General exam: lethargic Respiratory system: Clear to auscultation. Respiratory effort normal. Cardiovascular system: S 1. S 2 IRR Gastrointestinal system: Abdomen is nondistended, soft and nontender. No organomegaly or masses felt. Normal bowel sounds heard. Central nervous system:  Lethargic Extremities: Symmetric 5 x 5 power. Skin: No rashes, lesions or ulcers Psychiatry: lethargic    Data Reviewed: I have personally reviewed following labs and imaging studies  CBC:  Recent Labs Lab 05/11/16 2032  05/14/16 0850 05/15/16 0455 05/16/16 0402 05/17/16 0354 05/18/16 0532  WBC 11.5*  < > 11.8* 7.3 7.6 10.1 10.6*  NEUTROABS 7.2  --   --   --   --   --   --   HGB 10.1*  < > 9.1* 8.6* 8.8* 9.4* 9.5*  HCT 30.7*  < > 28.1* 27.3* 27.6* 28.9* 29.5*  MCV 98.4  < > 100.4* 100.0 100.4* 100.0 98.7  PLT 123*  < > 95* 71* 76* 83* 95*  < > = values in this interval not displayed. Basic Metabolic Panel:  Recent Labs Lab 05/14/16 0850 05/14/16 1827 05/15/16 0455 05/15/16 1712 05/16/16 0402 05/16/16 1717 05/17/16 0354 05/18/16 0532  NA 146*  --  144  --  146* 144 145 141  K 3.4*  --  4.3  --  3.9 3.8 3.7 4.1  CL 114*  --  114*  --  115* 110 112* 105  CO2 27  --  27  --  26 28 29  33*  GLUCOSE 131*  --  115*  --  108* 169* 124* 138*  BUN 20  --  21*  --  19 19 16 13   CREATININE 1.21*  --  1.03*  --  0.94 0.89 0.75 0.72  CALCIUM 8.5*  --  8.5*  --  8.8* 9.2 9.2 9.4  MG 2.0 1.9 2.0 1.9  --  1.9 2.1 1.9  PHOS 1.8* 1.4* 1.6* 2.7  --   --  2.6  --    GFR: Estimated Creatinine Clearance: 61.7 mL/min (by C-G formula based on SCr of 0.72 mg/dL). Liver Function Tests:  Recent Labs Lab 05/11/16 2032 05/12/16 0340 05/14/16 0850 05/15/16 0455  AST 46* 42* 76* 57*  ALT 11* 10* 16 16  ALKPHOS 64 56 53 56  BILITOT 2.5* 2.8* 2.3* 1.4*  PROT 6.6 5.7* 5.7* 5.3*  ALBUMIN 4.0 3.4* 3.4* 3.0*   No results for input(s): LIPASE, AMYLASE in the last 168 hours.  Recent Labs Lab 05/11/16 2032 05/12/16 0731 05/13/16 1415 05/15/16 0455  AMMONIA 41* 31 60* 37*   Coagulation Profile: No results for input(s): INR, PROTIME in the last 168 hours. Cardiac Enzymes:  Recent Labs Lab 05/16/16 1717 05/17/16 0030 05/17/16 0354  TROPONINI 0.20* 0.19* 0.18*   BNP (last 3  results) No results for input(s): PROBNP in the last 8760 hours. HbA1C: No results for input(s): HGBA1C in the last 72 hours. CBG:  Recent Labs Lab 05/17/16 1544 05/17/16 1921 05/17/16 2323 05/18/16 0302 05/18/16 0702  GLUCAP 173* 152* 175* 120* 140*   Lipid Profile: No results for input(s): CHOL, HDL, LDLCALC, TRIG, CHOLHDL, LDLDIRECT in  the last 72 hours. Thyroid Function Tests: No results for input(s): TSH, T4TOTAL, FREET4, T3FREE, THYROIDAB in the last 72 hours. Anemia Panel: No results for input(s): VITAMINB12, FOLATE, FERRITIN, TIBC, IRON, RETICCTPCT in the last 72 hours. Sepsis Labs:  Recent Labs Lab 05/13/16 0906 05/13/16 1229  LATICACIDVEN 1.3 4.4*    Recent Results (from the past 240 hour(s))  Urine culture     Status: Abnormal   Collection Time: 05/11/16  8:25 PM  Result Value Ref Range Status   Specimen Description URINE, RANDOM  Final   Special Requests NONE  Final   Culture MULTIPLE SPECIES PRESENT, SUGGEST RECOLLECTION (A)  Final   Report Status 05/13/2016 FINAL  Final  MRSA PCR Screening     Status: None   Collection Time: 05/12/16 12:24 AM  Result Value Ref Range Status   MRSA by PCR NEGATIVE NEGATIVE Final    Comment:        The GeneXpert MRSA Assay (FDA approved for NASAL specimens only), is one component of a comprehensive MRSA colonization surveillance program. It is not intended to diagnose MRSA infection nor to guide or monitor treatment for MRSA infections.   Culture, blood (Routine X 2) w Reflex to ID Panel     Status: None   Collection Time: 05/12/16  8:45 AM  Result Value Ref Range Status   Specimen Description BLOOD RIGHT ARM  Final   Special Requests BOTTLES DRAWN AEROBIC ONLY 5CC  Final   Culture   Final    NO GROWTH 5 DAYS Performed at [facility], [address], [place], [place] [address]    Report Status 05/17/2016 FINAL  Final  Culture, blood (Routine X 2) w Reflex to ID Panel     Status: None   Collection Time:  05/12/16  8:46 AM  Result Value Ref Range Status   Specimen Description BLOOD RIGHT HAND  Final   Special Requests IN PEDIATRIC BOTTLE 1.5CC  Final   Culture   Final    NO GROWTH 5 DAYS Performed at [facility], [address] [place], [place] [address]    Report Status 05/17/2016 FINAL  Final  Culture, Urine     Status: None   Collection Time: 05/13/16 10:20 PM  Result Value Ref Range Status   Specimen Description URINE, RANDOM  Final   Special Requests NONE  Final   Culture   Final    NO GROWTH Performed at [facility], [address], [place], [place] [address]    Report Status 05/15/2016 FINAL  Final  Culture, Urine     Status: None   Collection Time: 05/14/16 10:00 AM  Result Value Ref Range Status   Specimen Description URINE, CATHETERIZED  Final   Special Requests NONE  Final   Culture   Final    NO GROWTH Performed at [facility], [address], [place], [place] [address]    Report Status 05/15/2016 FINAL  Final         Radiology Studies: Dg Chest Port 1 View  Result Date: 05/17/2016 CLINICAL DATA:  Hypoxia per ordering note; pt id checked with NT EXAM: PORTABLE CHEST 1 VIEW COMPARISON:  05/15/2016 FINDINGS: LEFT-sided pacemaker overlies stable cardiac silhouette. NG tube and central venous line are unchanged. There is interval increase in bilateral small pleural effusions. No pneumothorax or pulmonary edema per IMPRESSION: 1. Stable support apparatus. 2. Increased bilateral pleural effusions. Electronically Signed   By: Loura Halt.D.  On: 05/17/2016 16:18   Dg Abd Portable 1v  Result Date: 05/17/2016 CLINICAL DATA:  Nasogastric tube placement. EXAM: PORTABLE ABDOMEN - 1 VIEW COMPARISON:  05/16/2016 FINDINGS: Enteric tube is present with tip over the stomach in the midline upper abdomen likely in distal stomach. Bowel gas pattern is nonobstructive. Remainder of the exam is unchanged. IMPRESSION: Nonobstructive bowel gas pattern.  Nasogastric tube with tip over the midline upper abdomen likely in the distal stomach. Electronically Signed   By: Elberta Fortisaniel  Boyle M.D.   On: 05/17/2016 08:06   Dg Abd Portable 1v  Result Date: 05/16/2016 CLINICAL DATA:  Nasogastric tube placement. EXAM: PORTABLE ABDOMEN - 1 VIEW COMPARISON:  Earlier today. FINDINGS: Feeding tube tip in the proximal stomach. No nasogastric tube is seen. Stable cardiac pacemaker leads. No dilated bowel loops. Lumbar spine degenerative changes. IMPRESSION: Feeding tube tip in the proximal stomach.  No nasogastric tube seen. Electronically Signed   By: Beckie SaltsSteven  Reid M.D.   On: 05/16/2016 14:12        Scheduled Meds: . amLODipine  5 mg Oral Daily  . aspirin  81 mg Per Tube Daily  . budesonide (PULMICORT) nebulizer solution  0.5 mg Nebulization BID  . chlorhexidine  15 mL Mouth Rinse BID  . Chlorhexidine Gluconate Cloth  6 each Topical Q0600  . feeding supplement (PRO-STAT SUGAR FREE 64)  30 mL Per Tube Daily  . fluticasone  2 spray Each Nare Daily  . free water  200 mL Per Tube Q4H  . furosemide  20 mg Intravenous BID  . heparin subcutaneous  5,000 Units Subcutaneous Q8H  . ipratropium  0.5 mg Nebulization TID  . lactulose  30 g Per Tube BID  . levalbuterol  0.63 mg Nebulization TID  . mouth rinse  15 mL Mouth Rinse BID  . metoprolol tartrate  50 mg Per Tube BID  . QUEtiapine  25 mg Per Tube QHS  . thiamine  100 mg Per Tube Daily   Continuous Infusions: . feeding supplement (VITAL AF 1.2 CAL) 1,000 mL (05/17/16 1400)     LOS: 6 days    Time spent: 35 minutes.     Alba Coryegalado, Danyiel Crespin A, MD [facility] Pager [phone]  If 7PM-7AM, please contact night-coverage [email or web address] Password [email or web address] 05/18/2016, 8:33 AM

## 2016-05-18 NOTE — Progress Notes (Signed)
Progress Note  Patient Name: Gabrielle [name] Date of Encounter: 05/18/2016  Primary Cardiologist: Anne Fu  Subjective   Lethargic, but arousable. Planned to take panda tube out today.   Inpatient Medications    Scheduled Meds: . amLODipine  5 mg Oral Daily  . aspirin  81 mg Per Tube Daily  . budesonide (PULMICORT) nebulizer solution  0.5 mg Nebulization BID  . chlorhexidine  15 mL Mouth Rinse BID  . Chlorhexidine Gluconate Cloth  6 each Topical Q0600  . fluticasone  2 spray Each Nare Daily  . heparin subcutaneous  5,000 Units Subcutaneous Q8H  . ipratropium  0.5 mg Nebulization TID  . lactulose  30 g Per Tube BID  . levalbuterol  0.63 mg Nebulization TID  . mouth rinse  15 mL Mouth Rinse BID  . metoprolol tartrate  50 mg Per Tube BID  . QUEtiapine  25 mg Per Tube QHS  . thiamine  100 mg Per Tube Daily   Continuous Infusions:  PRN Meds: acetaminophen **OR** acetaminophen, levalbuterol, LORazepam, ondansetron (ZOFRAN) IV   Vital Signs    Vitals:   05/18/16 1009 05/18/16 1036 05/18/16 1123 05/18/16 1124  BP:  137/72 135/65 135/65  Pulse: 85 73  78  Resp: 18 14    Temp:  97.7 F (36.5 C)    TempSrc:  Oral    SpO2: 98% 100%    Weight:      Height:        Intake/Output Summary (Last 24 hours) at 05/18/16 1309 Last data filed at 05/18/16 1254  Gross per 24 hour  Intake             1550 ml  Output             4450 ml  Net            -2900 ml   Filed Weights   05/16/16 0500 05/17/16 0417 05/18/16 0500  Weight: 181 lb (82.1 kg) 181 lb 7 oz (82.3 kg) 173 lb 8 oz (78.7 kg)    Telemetry    SR with episode of WCT - Personally Reviewed  ECG    N/A - Personally Reviewed  Physical Exam   General: WF, lethargic but arousable to questions. Head: Normocephalic, atraumatic.  Neck: Supple without bruits, JVD. Lungs:  Resp regular and unlabored, CTA. Heart: RRR, S1, S2, no S3, S4, or murmur; no rub. Abdomen: Soft, non-tender, non-distended with normoactive  bowel sounds. No hepatomegaly. No rebound/guarding. No obvious abdominal masses. Extremities: No clubbing, cyanosis, edema. Distal pedal pulses are 2+ bilaterally. Neuro: Alert and oriented X 3. Moves all extremities spontaneously. Psych: Normal affect.  Labs    Chemistry Recent Labs Lab 05/12/16 0340  05/14/16 0850 05/15/16 0455  05/16/16 1717 05/17/16 0354 05/18/16 0532  NA 146*  < > 146* 144  < > 144 145 141  K 3.0*  < > 3.4* 4.3  < > 3.8 3.7 4.1  CL 107  < > 114* 114*  < > 110 112* 105  CO2 32  < > 27 27  < > 28 29 33*  GLUCOSE 126*  < > 131* 115*  < > 169* 124* 138*  BUN 11  < > 20 21*  < > 19 16 13   CREATININE 1.11*  < > 1.21* 1.03*  < > 0.89 0.75 0.72  CALCIUM 8.6*  < > 8.5* 8.5*  < > 9.2 9.2 9.4  PROT 5.7*  --  5.7* 5.3*  --   --   --   --  ALBUMIN 3.4*  --  3.4* 3.0*  --   --   --   --   AST 42*  --  76* 57*  --   --   --   --   ALT 10*  --  16 16  --   --   --   --   ALKPHOS 56  --  53 56  --   --   --   --   BILITOT 2.8*  --  2.3* 1.4*  --   --   --   --   GFRNONAA 48*  < > 44* 53*  < > >60 >60 >60  GFRAA 56*  < > 51* >60  < > >60 >60 >60  ANIONGAP 7  < > 5 3*  < > 6 4* 3*  < > = values in this interval not displayed.   Hematology Recent Labs Lab 05/16/16 0402 05/17/16 0354 05/18/16 0532  WBC 7.6 10.1 10.6*  RBC 2.75* 2.89* 2.99*  HGB 8.8* 9.4* 9.5*  HCT 27.6* 28.9* 29.5*  MCV 100.4* 100.0 98.7  MCH 32.0 32.5 31.8  MCHC 31.9 32.5 32.2  RDW 16.2* 16.3* 16.3*  PLT 76* 83* 95*    Cardiac Enzymes Recent Labs Lab 05/16/16 1717 05/17/16 0030 05/17/16 0354  TROPONINI 0.20* 0.19* 0.18*    Recent Labs Lab 05/11/16 2044  TROPIPOC 0.00     BNPNo results for input(s): BNP, PROBNP in the last 168 hours.   DDimer No results for input(s): DDIMER in the last 168 hours.    Radiology    Dg Chest Port 1 View  Result Date: 05/17/2016 CLINICAL DATA:  Hypoxia per ordering note; pt id checked with NT EXAM: PORTABLE CHEST 1 VIEW COMPARISON:  05/15/2016  FINDINGS: LEFT-sided pacemaker overlies stable cardiac silhouette. NG tube and central venous line are unchanged. There is interval increase in bilateral small pleural effusions. No pneumothorax or pulmonary edema per IMPRESSION: 1. Stable support apparatus. 2. Increased bilateral pleural effusions. Electronically Signed   By: Genevive Bi M.D.   On: 05/17/2016 16:18   Dg Abd Portable 1v  Result Date: 05/17/2016 CLINICAL DATA:  Nasogastric tube placement. EXAM: PORTABLE ABDOMEN - 1 VIEW COMPARISON:  05/16/2016 FINDINGS: Enteric tube is present with tip over the stomach in the midline upper abdomen likely in distal stomach. Bowel gas pattern is nonobstructive. Remainder of the exam is unchanged. IMPRESSION: Nonobstructive bowel gas pattern. Nasogastric tube with tip over the midline upper abdomen likely in the distal stomach. Electronically Signed   By: Elberta Fortis M.D.   On: 05/17/2016 08:06   Dg Abd Portable 1v  Result Date: 05/16/2016 CLINICAL DATA:  Nasogastric tube placement. EXAM: PORTABLE ABDOMEN - 1 VIEW COMPARISON:  Earlier today. FINDINGS: Feeding tube tip in the proximal stomach. No nasogastric tube is seen. Stable cardiac pacemaker leads. No dilated bowel loops. Lumbar spine degenerative changes. IMPRESSION: Feeding tube tip in the proximal stomach.  No nasogastric tube seen. Electronically Signed   By: Beckie Salts M.D.   On: 05/16/2016 14:12    Cardiac Studies   TTE: 05/17/16  Study Conclusions  - Left ventricle: The cavity size was mildly dilated. Systolic   function was normal. The estimated ejection fraction was 55%.   There is akinesis of the apical myocardium. There is akinesis of   the basalinferior myocardium. There is akinesis of the   apicalanterior myocardium. There was a reduced contribution of   atrial contraction to ventricular filling, due to increased  ventricular diastolic pressure or atrial contractile dysfunction.   Doppler parameters are consistent with  a reversible restrictive   pattern, indicative of decreased left ventricular diastolic   compliance and/or increased left atrial pressure (grade 3   diastolic dysfunction). Doppler parameters are consistent with   high ventricular filling pressure. - Aortic valve: There was mild regurgitation. - Mitral valve: Severely calcified annulus. There was moderate   regurgitation directed posteriorly. Mean gradient (D): 7 mm Hg. - Left atrium: The atrium was mildly dilated. - Right ventricle: Pacer wire or catheter noted in right ventricle. - Right atrium: The atrium was mildly dilated. - Tricuspid valve: There was moderate regurgitation. - Pulmonic valve: There was mild regurgitation. - Pulmonary arteries: PA peak pressure: 59 mm Hg (S).  Impressions:  - The right ventricular systolic pressure was increased consistent   with moderate pulmonary hypertension.  Patient Profile     71 y.o. female followed by Dr Anne FuSkains with a history of CAD, s/p CABG x 7 in 2010, PAF with SSS, s/p BS PTVDP 12/26/15, prior GI bleeding- CHADs VASc=4 but not anticoagulated, HTN, and HLD. [facility] after the family called concerned about increasing confusion and paranoid behavior. We were asked to see for tachycardia. The pt was completely uncooperative and combative and required IV Precedex for sedation so we could control her HR.   Assessment & Plan    AF with RVR She is now in NSR on PO Lopressor. Was increased to 50mg  BID yesterday  Mental status change- More alert today, planned to take out panda tube today.   CAD- S/P CABG in 2010. Echo this admission showed her EF to be 55% grade 3 DD, moderate pulmonary HTN.  She has not had any anginal symptoms.   HTN  Improved  PTVDP-  BS pacemaker implanted Oct 2017   Anticoagulation- She had GI bleeding in 2017 but recently was cleared to resume Eliquis (see Dr Anne FuSkains OV 04/28/16). It's unclear if she ever did  this.  -- CHADs VASc-4.  Her Hbg is 9.5 and per CCM note she is felt to not be a candidate for anticoagulation at least at present.  Elevated Troponin with flat trend Likely related to acute illness and rapid afib.  Her trend is flat so suspect related to demand ischemia. Echo shows normal EF this admission.   Signed, Laverda PageLindsay Roberts, NP  05/18/2016, 1:09 PM    I have examined the patient and reviewed assessment and plan and discussed with patient.  Agree with above as stated.  Spoke with family member in the room.  Patient is much more alert than she has been.  HR well controlled at this time. Continue lopressor for rate control.  Mental status seems to be improving.  Will have to reassess her status for anticoagulation in the setting of being able to take meds, and mental status.  Mild anemia at this time as well with h/o GI bleed, but was cleared to take Eliquis in the past.  Social situation will have to be addressed.  THe family member in the room mentioned that she may need to go to a facility.    Lance MussJayadeep Maeva Dant

## 2016-05-18 NOTE — Progress Notes (Signed)
OT Cancellation Note  Patient Details Name: Roderic PalauVirginia W Scritchfield MRN: [id number] DOB: 1945/06/01   Cancelled Treatment:    Reason Eval/Treat Not Completed: Fatigue/lethargy limiting ability to participate  Pt had just gotten back to bed.  Will check on pt next day Lise AuerLori Min [place]unn[place], [place] [phone]  Einar CrowEDDING, Yena Tisby D 05/18/2016, 4:58 PM

## 2016-05-18 NOTE — Progress Notes (Signed)
Date:  May 18, 2016 Chart reviewed for concurrent status and case management needs. Will continue to follow patient progress. Discharge Planning: following for needs Expected discharge date: [id number] Rhonda Davis, BSN, RN3, CCM   [phone] 

## 2016-05-18 NOTE — Evaluation (Signed)
Physical Therapy Evaluation Patient Details Name: Gabrielle [name] MRN: [id number] DOB: November 24, 1945 Today's Date: 05/18/2016   History of Present Illness  71 y.o.femalewith history of CAD status post CABG, diastolic CHF, hypertension, atrial fibrillation not on anticoagulation secondary to severe GI bleed, tachybradycardia syndrome status post pacemaker placement was brought to the ERsecondary to confusion. Dx of encephalopathy, UTI,  hypotension, respiratory failure.  Clinical Impression  Pt admitted with above diagnosis. Pt currently with functional limitations due to the deficits listed below (see PT Problem List). Mod assist supine to sit and sit to stand, min A to pivot to recliner. Pt is deconditioned, lives alone, would benefit from ST-SNF for rehab.  Pt will benefit from skilled PT to increase their independence and safety with mobility to allow discharge to the venue listed below.       Follow Up Recommendations SNF;Supervision/Assistance - 24 hour    Equipment Recommendations  None recommended by PT    Recommendations for Other Services       Precautions / Restrictions Precautions Precautions: Fall Precaution Comments: son reports pt has h/o several falls Restrictions Weight Bearing Restrictions: No      Mobility  Bed Mobility Overal bed mobility: Needs Assistance Bed Mobility: Supine to Sit     Supine to sit: Mod assist;+2 for safety/equipment     General bed mobility comments: mod A to raise trunk, pad used to pivot hips to EOB  Transfers Overall transfer level: Needs assistance Equipment used: Rolling walker (2 wheeled) Transfers: Sit to/from [employer] Sit to Stand: Mod assist Stand pivot transfers: Min assist       General transfer comment: mod A to rise, VCs hand placement, min A to maneuver RW for SPT to recliner, VCs posture 2* flexed trunk  Ambulation/Gait             General Gait Details: deferred, pt fatigued with  stand pivot transfer  Stairs            Wheelchair Mobility    Modified Rankin (Stroke Patients Only)       Balance Overall balance assessment: Needs assistance Sitting-balance support: Feet supported;Bilateral upper extremity supported Sitting balance-Leahy Scale: Good     Standing balance support: Bilateral upper extremity supported Standing balance-Leahy Scale: Poor                               Pertinent Vitals/Pain Pain Assessment: No/denies pain    Home Living Family/patient expects to be discharged to:: Private residence Living Arrangements: Alone Available Help at Discharge: Family;Available PRN/intermittently Type of Home: House Home Access: Stairs to enter Entrance Stairs-Rails: Right Entrance Stairs-Number of Steps: 14 Home Layout: Multi-level Home Equipment: Cane - single point;Bedside commode;Walker - 2 wheels;Shower seat      Prior Function Level of Independence: Independent with assistive device(s)         Comments: walks short distances in home with RW, family is nearby and can assist prn     Hand Dominance   Dominant Hand: Right    Extremity/Trunk Assessment   Upper Extremity Assessment Upper Extremity Assessment: Overall WFL for tasks assessed    Lower Extremity Assessment Lower Extremity Assessment: Overall WFL for tasks assessed    Cervical / Trunk Assessment Cervical / Trunk Assessment: Kyphotic  Communication   Communication: No difficulties  Cognition Arousal/Alertness: Lethargic Behavior During Therapy: WFL for tasks assessed/performed Overall Cognitive Status: Within Functional Limits for tasks assessed  General Comments      Exercises     Assessment/Plan    PT Assessment Patient needs continued PT services  PT Problem List Decreased mobility;Decreased activity tolerance;Decreased balance       PT Treatment Interventions DME instruction;Gait training;Functional  mobility training;Balance training;Therapeutic exercise;Patient/family education;Therapeutic activities    PT Goals (Current goals can be found in the Care Plan section)  Acute Rehab PT Goals Patient Stated Goal: get home to her dog PT Goal Formulation: With patient/family Time For Goal Achievement: 06/01/16 Potential to Achieve Goals: Good    Frequency Min 3X/week   Barriers to discharge        Co-evaluation               End of Session Equipment Utilized During Treatment: Gait belt;Oxygen Activity Tolerance: Patient limited by fatigue Patient left: in chair;with call bell/phone within reach;with nursing/sitter in room;with family/visitor present Nurse Communication: Mobility status PT Visit Diagnosis: History of falling (Z91.81);Other abnormalities of gait and mobility (R26.89)         Time: [id number] PT Time Calculation (min) (ACUTE ONLY): 18 min   Charges:   PT Evaluation $PT Eval Moderate Complexity: 1 Procedure     PT G Codes:         Tamala SerUhlenberg, Udell Mazzocco Kistler 05/18/2016, 2:23 PM [phone]

## 2016-05-18 NOTE — Progress Notes (Signed)
  Speech Language Pathology Treatment: Dysphagia  Patient Details Name: Gabrielle [name] MRN: [id number] DOB: [birth year] Today's Date: 05/18/2016 Time: [id number] SLP Time Calculation (min) (ACUTE ONLY): 23 min  Assessment / Plan / Recommendation Clinical Impression  Pt sleepy but would awaken and verbalize to SLP stimulation.  She was oriented to current location and year.  SLP sat pt up and removed her mitts to allow her to self feed.  She took in 2 ounces applesauce, 2 ounces orange juice and single bite of moistened graham cracker with slow but effective oral transiting suspected.     No indications of aspiration during po, however after minimal intake - pt observed to belch x2 and begin coughing - concerning for refluxing and aspiration. Pt admits she refluxed worse than normal.   H/O reflux at home for which she takes pantoprazole - is worse now than baseline attributed her feeding tube per pt.    Pt with improved mentation today but continues to benefit from verbal/visual cues to maintain alertness during intake therefore there is concern for adequacy of po.  Spoke to MD re: recommendations, plan to initiate full liquid with strict aspiration/reflux precautions and leave panda tube in place.    Will follow up next date for tolerance/readiness for dietary advancement and adequacy of po intake.  Educated pt and sitter [name]* in room and provided precaution signs.  Assisted Joni Reiningicole to reposition pt per her request.      HPI HPI: 71 year old female admitted with acute encephalopathy; favoring infection and either drug toxicity or WD superimposed on dementia.  Seen at bedside 12/2015 and without evidence of oropharyneal dysphagia but with esophageal deficits noted. Dx with severe esophagitis, had EGD and barium swallow and was recommended to slowly advance to soft solids.       SLP Plan  Continue with current plan of care       Recommendations  Diet recommendations: Thin liquid (full  liquids in small amounts) Liquids provided via: Cup;Straw Medication Administration: Whole meds with puree (crush if large, follow with liquids) Supervision: Full supervision/cueing for compensatory strategies Compensations: Minimize environmental distractions;Slow rate;Small sips/bites;Follow solids with liquid Postural Changes and/or Swallow Maneuvers: Seated upright 90 degrees;Upright 30-60 min after meal                Follow up Recommendations:  (tbd) SLP Visit Diagnosis: Dysphagia, pharyngoesophageal phase (R13.14) Plan: Continue with current plan of care       GO                Mills KollerKimball, Quanna Wittke Ann Onetha Gaffey, MS [facility] SLP [phone]

## 2016-05-19 LAB — GLUCOSE, CAPILLARY
Glucose-Capillary: 113 mg/dL — ABNORMAL HIGH (ref 65–99)
Glucose-Capillary: 112 mg/dL — ABNORMAL HIGH (ref 65–99)
Glucose-Capillary: 153 mg/dL — ABNORMAL HIGH (ref 65–99)
Glucose-Capillary: 112 mg/dL — ABNORMAL HIGH (ref 65–99)
Glucose-Capillary: 135 mg/dL — ABNORMAL HIGH (ref 65–99)
Glucose-Capillary: 125 mg/dL — ABNORMAL HIGH (ref 65–99)

## 2016-05-19 LAB — CBC
HCT: 30.4 % — ABNORMAL LOW (ref 36.0–46.0)
Hemoglobin: 9.9 g/dL — ABNORMAL LOW (ref 12.0–15.0)
MCH: 32.2 pg (ref 26.0–34.0)
MCHC: 32.6 g/dL (ref 30.0–36.0)
MCV: 99 fL (ref 78.0–100.0)
Platelets: 97 10*3/uL — ABNORMAL LOW (ref 150–400)
RBC: 3.07 MIL/uL — ABNORMAL LOW (ref 3.87–5.11)
RDW: 16.4 % — ABNORMAL HIGH (ref 11.5–15.5)
WBC: 10.6 10*3/uL — ABNORMAL HIGH (ref 4.0–10.5)

## 2016-05-19 LAB — BASIC METABOLIC PANEL
Anion gap: 5 (ref 5–15)
BUN: 14 mg/dL (ref 6–20)
CO2: 34 mmol/L — ABNORMAL HIGH (ref 22–32)
Calcium: 9.2 mg/dL (ref 8.9–10.3)
Chloride: 103 mmol/L (ref 101–111)
Creatinine, Ser: 0.78 mg/dL (ref 0.44–1.00)
GFR calc Af Amer: >60 mL/min (ref >60)
GFR calc non Af Amer: >60 mL/min (ref >60)
Glucose, Bld: 112 mg/dL — ABNORMAL HIGH (ref 65–99)
Potassium: 3.9 mmol/L (ref 3.5–5.1)
Sodium: 142 mmol/L (ref 135–145)

## 2016-05-19 LAB — VITAMIN D 25 HYDROXY (VIT D DEFICIENCY, FRACTURES): Vit D, 25-Hydroxy: 14.2 ng/mL — ABNORMAL LOW (ref 30.0–100.0)

## 2016-05-19 MED ORDER — VITAMIN D (ERGOCALCIFEROL) 1.25 MG (50000 UNIT) PO CAPS
50000.0000 [IU] | ORAL_CAPSULE | ORAL | Status: DC
  Administered 2016-05-19: 50000 [IU] via ORAL
  Filled 2016-05-19: qty 1

## 2016-05-19 MED ORDER — METOPROLOL TARTRATE 25 MG/10 ML ORAL SUSPENSION
50.0000 mg | Freq: Two times a day (BID) | ORAL | Status: DC
  Administered 2016-05-20: 50 mg via ORAL
  Filled 2016-05-19 (×4): qty 20

## 2016-05-19 MED ORDER — DEXTROSE-NACL 5-0.9 % IV SOLN
INTRAVENOUS | Status: DC
  Administered 2016-05-19 (×2): via INTRAVENOUS

## 2016-05-19 NOTE — Progress Notes (Signed)
OT Cancellation Note  Patient Details Name: Gabrielle [name] MRN: [id number] DOB: 01-28-1946   Cancelled Treatment:    Reason Eval/Treat Not Completed: Medical issues which prohibited therapy. Pt with very low BP. Will check tomorrow.  Drayson Dorko 05/19/2016, 2:12 PM  Marica OtterMaryellen Janequa Kipnis, OTR/L [phone] 05/19/2016

## 2016-05-19 NOTE — Progress Notes (Signed)
  Speech Language Pathology Treatment:    Patient Details Name: Gabrielle [name] MRN: [id number] DOB: [birth year] Today's Date: 05/19/2016 Time: 2952-8413 SLP Time Calculation (min) (ACUTE ONLY): 15 min  Assessment / Plan / Recommendation Clinical Impression  Pt sitting upright and had just finished lunch - consuming her full liquid tray.  SlP encouraged pt to consume more po to allow clinical observation.  NO indications of dysphagia nor esophageal signs during intake today. Yesterday frequent belching and suspicion for reflux observed.  Today pt is self feeding with clear voice during all intake.  She does not use dentures with po and SlP requested sister to take them home to prevent loss.  Educated pt to reflux/esophageal precautions recommended due to prior dysphagia in 10/2015.  Pt benefited from encouragment to remain upright after meals with mod cues to clinical reasoning.    Recommend advance diet to soft with precautions, no further SLP warranted.  Thanks for this consult.    HPI HPI: 71 year old female admitted with acute encephalopathy; favoring infection and either drug toxicity or WD superimposed on dementia.  Seen at bedside 12/2015 and without evidence of oropharyneal dysphagia but with esophageal deficits noted. Dx with severe esophagitis, had EGD and barium swallow and was recommended to slowly advance to soft solids.       SLP Plan  All goals met       Recommendations  Diet recommendations: Dysphagia 3 (mechanical soft);Thin liquid Liquids provided via: Cup;Straw Medication Administration:  (as tolerated) Supervision: Full supervision/cueing for compensatory strategies Compensations: Minimize environmental distractions;Slow rate;Small sips/bites;Follow solids with liquid Postural Changes and/or Swallow Maneuvers: Seated upright 90 degrees;Upright 30-60 min after meal                Oral Care Recommendations: Oral care BID Follow up Recommendations: None (tbd) SLP  Visit Diagnosis: Dysphagia, pharyngoesophageal phase (R13.14) Plan: All goals met       [place], [place] [facility] SLP [phone]

## 2016-05-19 NOTE — Clinical Social Work Note (Signed)
Clinical Social Work Assessment  Patient Details  Name: Gabrielle [name] MRN: [id number] Date of Birth: 29-Aug-1945  Date of referral:  05/19/16               Reason for consult:  Discharge Planning                Permission sought to share information with:  [facility] Contact Representative Permission granted to share information::  Yes, Verbal Permission Granted  Name::        Agency::     Relationship::     Contact Information:     Housing/Transportation Living arrangements for the past 2 months:  Single Family Home Source of Information:  Patient Patient Interpreter Needed:  None Criminal Activity/Legal Involvement Pertinent to Current Situation/Hospitalization:  No - Comment as needed Significant Relationships:  Siblings Lives with:  Self Do you feel safe going back to the place where you live?   (SNF recommended.) Need for family participation in patient care:  Yes (Comment)  Care giving concerns:  Pt's care cannot be managed at home prior to hospitalization  Social Worker assessment / plan: Pt hospitalized on 05/11/16 from home with acute encephalopathy. Pt was IVC. Commitment papers have expired. Pt is now alert and oriented x4. PT has recommended SNF at d/c. Pt is in agreement with this plan and SNF search has been initiated. Bed offers are pending. CSW will continue to follow to assist with d/c planning needs.   Employment status:  Retired Database administratornsurance information:  Managed Medicare PT Recommendations:  Skilled Nursing Facility Information / Referral to community resources:  Skilled Nursing Facility  Patient/Family's Response to care:  Pt is in agreement with ST Rehab placement.  Patient/Family's Understanding of and Emotional Response to Diagnosis, Current Treatment, and Prognosis:  Pt does not remember being hospitalized. " I'm feeling much better. I hope I didn't cuss at you. " Pt has been in rehab in the past. " I know where I don't want to go." Pt appreciates CSW  assistance.  Emotional Assessment Appearance:  Appears stated age Attitude/Demeanor/Rapport:   (cooperative) Affect (typically observed):  Pleasant, Appropriate Orientation:  Oriented to Self, Oriented to Place, Oriented to  Time, Oriented to Situation Alcohol / Substance use:  Not Applicable Psych involvement (Current and /or in the community):  No (Comment)  Discharge Needs  Concerns to be addressed:  Discharge Planning Concerns Readmission within the last 30 days:  No Current discharge risk:  None Barriers to Discharge:  No Barriers Identified   Vennie HomansHaidinger, Leighanna Kirn Lee, LCSW [phone] 05/19/2016, 3:56 PM

## 2016-05-19 NOTE — Progress Notes (Signed)
PROGRESS NOTE    Gabrielle [name]  ZOX:[id number] DOB: 12-15-1945 DOA: 05/11/2016 PCP: Irving Copas, MD    Brief Narrative: 71 y.o.femalewith history of CAD status post CABG, diastolic CHF, hypertension, atrial fibrillation not on anticoagulation secondary to severe GI bleed, tachybradycardia syndrome status post pacemaker placement was brought to the ER secondary to confusion.  Patient admitted with AMS, confusion, agitation. Subsequently develops SVT. She didn't respond to haldol, or ativan. She was treated with precex gtt. She became hypotensive from medications and was started on pressors. There was consideration regarding meningitis but after neurology evaluation, meningitis was less likely. EEG negative for seizure. The reason for AMS is unclear, could be related to medications effect, unclear what medications she was taking, Infection UTI.     Assessment & Plan:   Principal Problem:   Acute encephalopathy Active Problems:   Essential hypertension   PAF (paroxysmal atrial fibrillation) (HCC)   Tachy-brady syndrome (HCC)   Hypokalemia   Encounter for central line care   Acute renal failure (ARF) (HCC)   Protein-calorie malnutrition, severe   H/O: GI bleed    Acute encephalopathy; improving Might be related to infection, UTI vs drug toxicity vs WD superimposed dementia.  Patient was on precedex Gtt, which help with agitation.  She has been off precedex. Started on Seroquel.  Ativan PRN.  Ammonia was mildly elevated at 60. Will hold lactulsoe lactulose due to diarrhea and soft BP.  She has been lethargic, Seroquel was reduce to once  A day on 3-19.  Continue with support care.  Improving.   Hypotension;  Related to medication, precedex. She was on pressors.  Blood pressure again low in the 90. Will hold norvasc. Hold lactulose due to diarrhea. And will give IV fluids.   Acute Hypoxic respiratory failure.  She was on 10 L of oxygen, requiring less amount of  oxygen this morning at 2 L.  Received two doses of IV lasix which help with hypoxemia.  Chest x ray bilateral effusion.   Hold lasix due to hypotension.  Resume IV fluids 3-20 due to diarrhea and hypotension. Monitor closely on IV fluids.   UTI; treated with IV ceftriaxoen  -Paroxysmal atrial fibrillation, SVT; -Had two episode of SVT , patient was noticed to be hypoxic prior to one of the episodes.  -not on anticoagulation given hx of severe GI bleed -CHADsVASC score 4 -monitor on telemetry  On oral metoprolol.  Cardiology following.   -Hypokalemia and low magnesium  -resolved.   -AKI: improved.  -hypovolemia. Infection.  Improved.  [place]; left nephrolithiasis, no obstruction.   Acute on chronic diastolic heart failure -hold lasix.   -Hypernatremia -resolved with IV fluids.   -Cirrhosis. Transaminases;  Outpatient follow up.  Hold lactulose due to diarrhea.  US liver; finding suggest cirrhosis.      DVT prophylaxis: Heparin  Code Status:  DNR Family Communication: none at bedside.  Disposition Plan: remain in the step down unit   Consultants:   CCM  Neurology  Cardiology    Procedures:  EEG    Antimicrobials:  Ceftriaxone   Subjective: She is more alert, NG tube was removed 3-19. She is tolerating diet.  Mild abdominal discomfort.  Diarrhea overnight   Objective: Vitals:   05/19/16 0438 05/19/16 0500 05/19/16 0600 05/19/16 0700  BP:   (!) 124/36   Pulse:  68 64 62  Resp:  (!) 6 (!) 22 (!) 0  Temp:      TempSrc:      SpO2:  99% 97% 97%  Weight: 80.6 kg (177 lb 11.1 oz)     Height:        Intake/Output Summary (Last 24 hours) at 05/19/16 0809 Last data filed at 05/19/16 0600  Gross per 24 hour  Intake              410 ml  Output             2000 ml  Net            -1590 ml   Filed Weights   05/17/16 0417 05/18/16 0500 05/19/16 0438  Weight: 82.3 kg (181 lb 7 oz) 78.7 kg (173 lb 8 oz) 80.6 kg (177 lb 11.1 oz)     Examination:  General exam: alert, oriented to place , person  Respiratory system: Clear to auscultation. Respiratory effort normal. Cardiovascular system: S 1. S 2 IRR Gastrointestinal system: Abdomen is nondistended, soft and nontender. No organomegaly or masses felt. Normal bowel sounds heard. Central nervous system: alert , follows command.  Extremities: Symmetric 5 x 5 power. Skin: No rashes, lesions or ulcers Psychiatry: lethargic    Data Reviewed: I have personally reviewed following labs and imaging studies  CBC:  Recent Labs Lab 05/15/16 0455 05/16/16 0402 05/17/16 0354 05/18/16 0532 05/19/16 0400  WBC 7.3 7.6 10.1 10.6* 10.6*  HGB 8.6* 8.8* 9.4* 9.5* 9.9*  HCT 27.3* 27.6* 28.9* 29.5* 30.4*  MCV 100.0 100.4* 100.0 98.7 99.0  PLT 71* 76* 83* 95* 97*   Basic Metabolic Panel:  Recent Labs Lab 05/14/16 0850 05/14/16 1827 05/15/16 0455 05/15/16 1712 05/16/16 0402 05/16/16 1717 05/17/16 0354 05/18/16 0532 05/19/16 0400  NA 146*  --  144  --  146* 144 145 141 142  K 3.4*  --  4.3  --  3.9 3.8 3.7 4.1 3.9  CL 114*  --  114*  --  115* 110 112* 105 103  CO2 27  --  27  --  26 28 29  33* 34*  GLUCOSE 131*  --  115*  --  108* 169* 124* 138* 112*  BUN 20  --  21*  --  19 19 16 13 14   CREATININE 1.21*  --  1.03*  --  0.94 0.89 0.75 0.72 0.78  CALCIUM 8.5*  --  8.5*  --  8.8* 9.2 9.2 9.4 9.2  MG 2.0 1.9 2.0 1.9  --  1.9 2.1 1.9  --   PHOS 1.8* 1.4* 1.6* 2.7  --   --  2.6  --   --    GFR: Estimated Creatinine Clearance: 62.5 mL/min (by C-G formula based on SCr of 0.78 mg/dL). Liver Function Tests:  Recent Labs Lab 05/14/16 0850 05/15/16 0455  AST 76* 57*  ALT 16 16  ALKPHOS 53 56  BILITOT 2.3* 1.4*  PROT 5.7* 5.3*  ALBUMIN 3.4* 3.0*   No results for input(s): LIPASE, AMYLASE in the last 168 hours.  Recent Labs Lab 05/13/16 1415 05/15/16 0455  AMMONIA 60* 37*   Coagulation Profile: No results for input(s): INR, PROTIME in the last 168  hours. Cardiac Enzymes:  Recent Labs Lab 05/16/16 1717 05/17/16 0030 05/17/16 0354  TROPONINI 0.20* 0.19* 0.18*   BNP (last 3 results) No results for input(s): PROBNP in the last 8760 hours. HbA1C: No results for input(s): HGBA1C in the last 72 hours. CBG:  Recent Labs Lab 05/18/16 1526 05/18/16 1934 05/19/16 0002 05/19/16 0332 05/19/16 0744  GLUCAP 114* 94 149* 113* 112*   Lipid Profile:  No results for input(s): CHOL, HDL, LDLCALC, TRIG, CHOLHDL, LDLDIRECT in the last 72 hours. Thyroid Function Tests: No results for input(s): TSH, T4TOTAL, FREET4, T3FREE, THYROIDAB in the last 72 hours. Anemia Panel: No results for input(s): VITAMINB12, FOLATE, FERRITIN, TIBC, IRON, RETICCTPCT in the last 72 hours. Sepsis Labs:  Recent Labs Lab 05/13/16 0906 05/13/16 1229  LATICACIDVEN 1.3 4.4*    Recent Results (from the past 240 hour(s))  Urine culture     Status: Abnormal   Collection Time: 05/11/16  8:25 PM  Result Value Ref Range Status   Specimen Description URINE, RANDOM  Final   Special Requests NONE  Final   Culture MULTIPLE SPECIES PRESENT, SUGGEST RECOLLECTION (A)  Final   Report Status 05/13/2016 FINAL  Final  MRSA PCR Screening     Status: None   Collection Time: 05/12/16 12:24 AM  Result Value Ref Range Status   MRSA by PCR NEGATIVE NEGATIVE Final    Comment:        The GeneXpert MRSA Assay (FDA approved for NASAL specimens only), is one component of a comprehensive MRSA colonization surveillance program. It is not intended to diagnose MRSA infection nor to guide or monitor treatment for MRSA infections.   Culture, blood (Routine X 2) w Reflex to ID Panel     Status: None   Collection Time: 05/12/16  8:45 AM  Result Value Ref Range Status   Specimen Description BLOOD RIGHT ARM  Final   Special Requests BOTTLES DRAWN AEROBIC ONLY 5CC  Final   Culture   Final    NO GROWTH 5 DAYS Performed at [facility], [address], [place], [place]  [address]    Report Status 05/17/2016 FINAL  Final  Culture, blood (Routine X 2) w Reflex to ID Panel     Status: None   Collection Time: 05/12/16  8:46 AM  Result Value Ref Range Status   Specimen Description BLOOD RIGHT HAND  Final   Special Requests IN PEDIATRIC BOTTLE 1.5CC  Final   Culture   Final    NO GROWTH 5 DAYS Performed at [facility], [address], [place], [place] [address]    Report Status 05/17/2016 FINAL  Final  Culture, Urine     Status: None   Collection Time: 05/13/16 10:20 PM  Result Value Ref Range Status   Specimen Description URINE, RANDOM  Final   Special Requests NONE  Final   Culture   Final    NO GROWTH Performed at [facility], [address] [place], [place] [address]    Report Status 05/15/2016 FINAL  Final  Culture, Urine     Status: None   Collection Time: 05/14/16 10:00 AM  Result Value Ref Range Status   Specimen Description URINE, CATHETERIZED  Final   Special Requests NONE  Final   Culture   Final    NO GROWTH Performed at [facility], [address] [place], [place] [address]    Report Status 05/15/2016 FINAL  Final         Radiology Studies: Dg Chest Port 1 View  Result Date: 05/17/2016 CLINICAL DATA:  Hypoxia per ordering note; pt id checked with NT EXAM: PORTABLE CHEST 1 VIEW COMPARISON:  05/15/2016 FINDINGS: LEFT-sided pacemaker overlies stable cardiac silhouette. NG tube and central venous line are unchanged. There is interval increase in bilateral small pleural effusions. No pneumothorax or pulmonary edema per IMPRESSION: 1. Stable support apparatus. 2. Increased bilateral pleural  effusions. Electronically Signed   By: Genevive BiStewart  Edmunds M.D.   On: 05/17/2016 16:18        Scheduled Meds: . aspirin  81 mg Per Tube Daily  . budesonide (PULMICORT) nebulizer solution  0.5 mg Nebulization BID  . chlorhexidine  15 mL Mouth Rinse BID  . Chlorhexidine Gluconate Cloth  6 each Topical Q0600  . fluticasone  2  spray Each Nare Daily  . heparin subcutaneous  5,000 Units Subcutaneous Q8H  . ipratropium  0.5 mg Nebulization TID  . lactulose  30 g Per Tube BID  . levalbuterol  0.63 mg Nebulization TID  . mouth rinse  15 mL Mouth Rinse BID  . metoprolol tartrate  50 mg Oral BID  . QUEtiapine  25 mg Per Tube QHS  . thiamine  100 mg Per Tube Daily  . Vitamin D (Ergocalciferol)  50,000 Units Oral Q7 days   Continuous Infusions: . dextrose 5 % and 0.9% NaCl       LOS: 7 days    Time spent: 35 minutes.     Alba Coryegalado, Hoy Fallert A, MD [facility] Pager [phone]  If 7PM-7AM, please contact night-coverage [email or web address] Password [email or web address] 05/19/2016, 8:09 AM

## 2016-05-19 NOTE — Progress Notes (Signed)
Progress Note  Patient Name: Gabrielle [name] Date of Encounter: 05/19/2016  Primary Cardiologist: Anne Fu  Subjective   Much more alert today. Family at the bedside.    Inpatient Medications    Scheduled Meds: . aspirin  81 mg Per Tube Daily  . budesonide (PULMICORT) nebulizer solution  0.5 mg Nebulization BID  . chlorhexidine  15 mL Mouth Rinse BID  . Chlorhexidine Gluconate Cloth  6 each Topical Q0600  . fluticasone  2 spray Each Nare Daily  . heparin subcutaneous  5,000 Units Subcutaneous Q8H  . ipratropium  0.5 mg Nebulization TID  . levalbuterol  0.63 mg Nebulization TID  . mouth rinse  15 mL Mouth Rinse BID  . metoprolol tartrate  50 mg Oral BID  . QUEtiapine  25 mg Per Tube QHS  . thiamine  100 mg Per Tube Daily  . Vitamin D (Ergocalciferol)  50,000 Units Oral Q7 days   Continuous Infusions: . dextrose 5 % and 0.9% NaCl 75 mL/hr at 05/19/16 1300   PRN Meds: acetaminophen **OR** acetaminophen, levalbuterol, LORazepam, ondansetron (ZOFRAN) IV   Vital Signs    Vitals:   05/19/16 1119 05/19/16 1152 05/19/16 1200 05/19/16 1300  BP:  (!) 105/53 (!) 100/33 (!) 88/25  Pulse:  68 75 72  Resp:   17 19  Temp: 97.9 F (36.6 C)     TempSrc: Oral     SpO2:   95% 98%  Weight:      Height:        Intake/Output Summary (Last 24 hours) at 05/19/16 1344 Last data filed at 05/19/16 1300  Gross per 24 hour  Intake            577.5 ml  Output              775 ml  Net           -197.5 ml   Filed Weights   05/17/16 0417 05/18/16 0500 05/19/16 0438  Weight: 181 lb 7 oz (82.3 kg) 173 lb 8 oz (78.7 kg) 177 lb 11.1 oz (80.6 kg)    Telemetry    SR with PACs, PVCs- Personally Reviewed  ECG    N/A - Personally Reviewed  Physical Exam   General: WF, more alert and oriented today. Head: Normocephalic, atraumatic.  Neck: Supple without bruits, JVD. Lungs:  Resp regular and unlabored, CTA. Heart: RRR, S1, S2, no S3, S4, or murmur; no rub. Abdomen: Soft,  non-tender, non-distended with normoactive bowel sounds. No hepatomegaly. No rebound/guarding. No obvious abdominal masses. Extremities: No clubbing, cyanosis, edema. Distal pedal pulses are 2+ bilaterally. Neuro: Alert and oriented X 3. Moves all extremities spontaneously. Psych: Normal affect.  Labs    Chemistry Recent Labs Lab 05/14/16 0850 05/15/16 0455  05/17/16 0354 05/18/16 0532 05/19/16 0400  NA 146* 144  < > 145 141 142  K 3.4* 4.3  < > 3.7 4.1 3.9  CL 114* 114*  < > 112* 105 103  CO2 27 27  < > 29 33* 34*  GLUCOSE 131* 115*  < > 124* 138* 112*  BUN 20 21*  < > 16 13 14   CREATININE 1.21* 1.03*  < > 0.75 0.72 0.78  CALCIUM 8.5* 8.5*  < > 9.2 9.4 9.2  PROT 5.7* 5.3*  --   --   --   --   ALBUMIN 3.4* 3.0*  --   --   --   --   AST 76* 57*  --   --   --   --  ALT 16 16  --   --   --   --   ALKPHOS 53 56  --   --   --   --   BILITOT 2.3* 1.4*  --   --   --   --   GFRNONAA 44* 53*  < > >60 >60 >60  GFRAA 51* >60  < > >60 >60 >60  ANIONGAP 5 3*  < > 4* 3* 5  < > = values in this interval not displayed.   Hematology  Recent Labs Lab 05/17/16 0354 05/18/16 0532 05/19/16 0400  WBC 10.1 10.6* 10.6*  RBC 2.89* 2.99* 3.07*  HGB 9.4* 9.5* 9.9*  HCT 28.9* 29.5* 30.4*  MCV 100.0 98.7 99.0  MCH 32.5 31.8 32.2  MCHC 32.5 32.2 32.6  RDW 16.3* 16.3* 16.4*  PLT 83* 95* 97*    Cardiac Enzymes  Recent Labs Lab 05/16/16 1717 05/17/16 0030 05/17/16 0354  TROPONINI 0.20* 0.19* 0.18*   No results for input(s): TROPIPOC in the last 168 hours.   BNPNo results for input(s): BNP, PROBNP in the last 168 hours.   DDimer No results for input(s): DDIMER in the last 168 hours.    Radiology    Dg Chest Port 1 View  Result Date: 05/17/2016 CLINICAL DATA:  Hypoxia per ordering note; pt id checked with NT EXAM: PORTABLE CHEST 1 VIEW COMPARISON:  05/15/2016 FINDINGS: LEFT-sided pacemaker overlies stable cardiac silhouette. NG tube and central venous line are unchanged. There is  interval increase in bilateral small pleural effusions. No pneumothorax or pulmonary edema per IMPRESSION: 1. Stable support apparatus. 2. Increased bilateral pleural effusions. Electronically Signed   By: Genevive BiStewart  Edmunds M.D.   On: 05/17/2016 16:18    Cardiac Studies   TTE: 05/17/16  Study Conclusions  - Left ventricle: The cavity size was mildly dilated. Systolic   function was normal. The estimated ejection fraction was 55%.   There is akinesis of the apical myocardium. There is akinesis of   the basalinferior myocardium. There is akinesis of the   apicalanterior myocardium. There was a reduced contribution of   atrial contraction to ventricular filling, due to increased   ventricular diastolic pressure or atrial contractile dysfunction.   Doppler parameters are consistent with a reversible restrictive   pattern, indicative of decreased left ventricular diastolic   compliance and/or increased left atrial pressure (grade 3   diastolic dysfunction). Doppler parameters are consistent with   high ventricular filling pressure. - Aortic valve: There was mild regurgitation. - Mitral valve: Severely calcified annulus. There was moderate   regurgitation directed posteriorly. Mean gradient (D): 7 mm Hg. - Left atrium: The atrium was mildly dilated. - Right ventricle: Pacer wire or catheter noted in right ventricle. - Right atrium: The atrium was mildly dilated. - Tricuspid valve: There was moderate regurgitation. - Pulmonic valve: There was mild regurgitation. - Pulmonary arteries: PA peak pressure: 59 mm Hg (S).  Impressions:  - The right ventricular systolic pressure was increased consistent   with moderate pulmonary hypertension.  Patient Profile     71 y.o. female followed by Dr Anne FuSkains with a history of CAD, s/p CABG x 7 in 2010, PAF with SSS, s/p BS PTVDP 12/26/15, prior GI bleeding- CHADs VASc=4 but not anticoagulated, HTN, and HLD. [facility] by  the police after the family called concerned about increasing confusion and paranoid behavior. We were asked to see for tachycardia. The pt was completely uncooperative and  combative and required IV Precedex for sedation so we could control her HR.   Assessment & Plan    AF with RVR She is now in NSR on PO Lopressor.  Continue 50mg  BID as blood pressure allows  Mental status change- Seems to have resolved. Alert and oriented today.   CAD- S/P CABG in 2010. Echo this admission showed her EF to be 55% grade 3 DD, moderate pulmonary HTN.  She has not had any anginal symptoms.   HTN  Slightly hypotensive this afternoon. Continue to monitor.   PTVDP-  BS pacemaker implanted Oct 2017   Anticoagulation- She had GI bleeding in 2017 but recently was cleared to resume Eliquis (see Dr Anne Fu OV 04/28/16). It's unclear if she ever did this.  -- CHADs VASc-4.  Her Hbg is 9.9  -- need to reassess the possibility of OAC  Elevated Troponin with flat trend Likely related to acute illness and rapid afib.  Her trend is flat so suspect related to demand ischemia. Echo shows normal EF this admission.   Signed, Laverda Page, NP  05/19/2016, 1:44 PM    I have examined the patient.  Agree with above as stated.  Patient with controlled heart rate.  Mental status improving.  No changes in therapy today.  Lance Muss

## 2016-05-19 NOTE — NC FL2 (Signed)
[place] MEDICAID FL2 LEVEL OF CARE SCREENING TOOL     IDENTIFICATION  Patient Name: Gabrielle [name] Birthdate: 02/05/1946 Sex: female Admission Date (Current Location): 05/11/2016  [facility] and [place] Number:  Producer, television/film/video and Address:  [facility],  [address] [place] [address]      Provider Number: [id number]  Attending Physician Name and Address:  Alba Cory, MD  Relative Name and Phone Number:       Current Level of Care: Hospital Recommended Level of Care: Skilled Nursing Facility Prior Approval Number:    Date Approved/Denied:   PASRR Number:    Discharge Plan: SNF    Current Diagnoses: Patient Active Problem List   Diagnosis Date Noted  . H/O: GI bleed   . Protein-calorie malnutrition, severe 05/15/2016  . Altered mental status   . Acute renal failure (ARF) (HCC)   . Encounter for central line care   . Acute encephalopathy 05/11/2016  . Hypokalemia 05/11/2016  . Pressure injury of skin 12/25/2015  . Esophageal ulcer with bleeding 12/25/2015  . Thrombocytopenia (HCC) 12/25/2015  . Acute congestive heart failure (HCC)   . SOB (shortness of breath)   . Dysphagia 12/24/2015  . Tachy-brady syndrome (HCC) 12/24/2015  . Chronic anticoagulation   . Fall   . Prolonged Q-T interval on ECG   . Acute on chronic diastolic CHF (congestive heart failure) (HCC) 12/16/2015  . Acute respiratory failure with hypoxia (HCC) 12/16/2015  . Community acquired pneumonia of right middle lobe of lung (HCC) 12/16/2015  . Anemia due to blood loss, acute 10/27/2015  . Elevated lactic acid level 10/27/2015  . Systolic heart failure (HCC) 10/27/2015  . Arterial hypotension   . PAF (paroxysmal atrial fibrillation) (HCC) 10/21/2015  . Systolic CHF (HCC) 10/21/2015  . Severe anemia   . Symptomatic anemia   . Coronary artery disease due to lipid rich plaque 02/07/2014  . Obesity 02/07/2014  . Abdominal aortic ectasia (HCC) 09/06/2013  .  Encounter for therapeutic drug monitoring 07/13/2013  . Atrial fibrillation (HCC) 12/20/2012  . Atrial fibrillation with controlled ventricular response (HCC) 11/30/2009  . NICOTINE ADDICTION 04/02/2009  . PARAPSORIASIS 12/15/2008  . Hyperlipidemia 12/14/2008  . HEART ATTACK 12/14/2008  . HEADACHE, CHRONIC 12/14/2008  . Depression 12/13/2008  . Essential hypertension 12/13/2008  . COPD exacerbation (HCC) 12/13/2008  . ARTHRITIS 12/13/2008    Orientation RESPIRATION BLADDER Height & Weight     Self, Time, Situation, Place  O2 Indwelling catheter Weight: 177 lb 11.1 oz (80.6 kg) Height:  5\' 2"  (157.5 cm)  BEHAVIORAL SYMPTOMS/MOOD NEUROLOGICAL BOWEL NUTRITION STATUS  Other (Comment) (no behaviors)   Incontinent Diet  AMBULATORY STATUS COMMUNICATION OF NEEDS Skin   Extensive Assist Verbally Normal                       Personal Care Assistance Level of Assistance  Bathing, Feeding, Dressing Bathing Assistance: Limited assistance Feeding assistance: Independent Dressing Assistance: Limited assistance     Functional Limitations Info  Sight, Hearing, Speech Sight Info: Adequate Hearing Info: Adequate Speech Info: Adequate    SPECIAL CARE FACTORS FREQUENCY  PT (By licensed PT), OT (By licensed OT)     PT Frequency: 5x wk OT Frequency: 5x wk            Contractures Contractures Info: Not present    Additional Factors Info  Code Status Code Status Info: DNR  Current Medications (05/19/2016):  This is the current hospital active medication list Current Facility-Administered Medications  Medication Dose Route Frequency Provider Last Rate Last Dose  . acetaminophen (TYLENOL) tablet 650 mg  650 mg Oral Q6H PRN Eduard ClosArshad N Kakrakandy, MD   650 mg at 05/18/16 2121   Or  . acetaminophen (TYLENOL) suppository 650 mg  650 mg Rectal Q6H PRN Eduard ClosArshad N Kakrakandy, MD      . aspirin chewable tablet 81 mg  81 mg Per Tube Daily Karl ItoSteven E Sommer, MD   81 mg at  05/19/16 1155  . budesonide (PULMICORT) nebulizer solution 0.5 mg  0.5 mg Nebulization BID Vassie Lollarlos Madera, MD   0.5 mg at 05/19/16 1003  . chlorhexidine (PERIDEX) 0.12 % solution 15 mL  15 mL Mouth Rinse BID Alyson ReedyWesam G Yacoub, MD   15 mL at 05/19/16 1153  . Chlorhexidine Gluconate Cloth 2 % PADS 6 each  6 each Topical Q0600 Roslynn AmbleJennings E Nestor, MD   Stopped at 05/19/16 0600  . dextrose 5 %-0.9 % sodium chloride infusion   Intravenous Continuous Belkys A Regalado, MD 75 mL/hr at 05/19/16 1300    . fluticasone (FLONASE) 50 MCG/ACT nasal spray 2 spray  2 spray Each Nare Daily Eduard ClosArshad N Kakrakandy, MD   2 spray at 05/19/16 1156  . heparin injection 5,000 Units  5,000 Units Subcutaneous Q8H Roslynn AmbleJennings E Nestor, MD   5,000 Units at 05/19/16 1615  . ipratropium (ATROVENT) nebulizer solution 0.5 mg  0.5 mg Nebulization TID Vassie Lollarlos Madera, MD   0.5 mg at 05/19/16 1502  . levalbuterol (XOPENEX) nebulizer solution 0.63 mg  0.63 mg Nebulization TID Vassie Lollarlos Madera, MD   0.63 mg at 05/19/16 1502  . levalbuterol (XOPENEX) nebulizer solution 0.63 mg  0.63 mg Nebulization Q6H PRN Vassie Lollarlos Madera, MD   0.63 mg at 05/13/16 0454  . LORazepam (ATIVAN) injection 0.5 mg  0.5 mg Intravenous Q4H PRN Simonne MartinetPeter E Babcock, NP   0.5 mg at 05/17/16 0355  . MEDLINE mouth rinse  15 mL Mouth Rinse BID Eduard ClosArshad N Kakrakandy, MD   15 mL at 05/18/16 2138  . metoprolol tartrate (LOPRESSOR) 25 mg/10 mL oral suspension 50 mg  50 mg Oral BID Alba CoryBelkys A Regalado, MD   Stopped at 05/19/16 1152  . ondansetron (ZOFRAN) injection 4 mg  4 mg Intravenous Q6H PRN Alyson ReedyWesam G Yacoub, MD   4 mg at 05/16/16 1214  . QUEtiapine (SEROQUEL) tablet 25 mg  25 mg Per Tube QHS Belkys A Regalado, MD   25 mg at 05/18/16 2121  . thiamine (VITAMIN B-1) tablet 100 mg  100 mg Per Tube Daily Roslynn AmbleJennings E Nestor, MD   100 mg at 05/19/16 1154  . Vitamin D (Ergocalciferol) (DRISDOL) capsule 50,000 Units  50,000 Units Oral Q7 days Alba CoryBelkys A Regalado, MD   50,000 Units at 05/19/16 1154      Discharge Medications: Please see discharge summary for a list of discharge medications.  Relevant Imaging Results:  Relevant Lab Results:   Additional Information SS#: [id number]. 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

## 2016-05-20 DIAGNOSIS — F329 Major depressive disorder, single episode, unspecified: Secondary | ICD-10-CM

## 2016-05-20 DIAGNOSIS — F0391 Unspecified dementia with behavioral disturbance: Secondary | ICD-10-CM

## 2016-05-20 DIAGNOSIS — F03918 Unspecified dementia, unspecified severity, with other behavioral disturbance: Secondary | ICD-10-CM

## 2016-05-20 DIAGNOSIS — E43 Unspecified severe protein-calorie malnutrition: Secondary | ICD-10-CM

## 2016-05-20 DIAGNOSIS — R5381 Other malaise: Secondary | ICD-10-CM

## 2016-05-20 LAB — BASIC METABOLIC PANEL
Anion gap: 6 (ref 5–15)
BUN: 15 mg/dL (ref 6–20)
CO2: 32 mmol/L (ref 22–32)
Calcium: 8.8 mg/dL — ABNORMAL LOW (ref 8.9–10.3)
Chloride: 106 mmol/L (ref 101–111)
Creatinine, Ser: 0.72 mg/dL (ref 0.44–1.00)
GFR calc Af Amer: >60 mL/min (ref >60)
GFR calc non Af Amer: >60 mL/min (ref >60)
Glucose, Bld: 120 mg/dL — ABNORMAL HIGH (ref 65–99)
Potassium: 3.8 mmol/L (ref 3.5–5.1)
Sodium: 144 mmol/L (ref 135–145)

## 2016-05-20 LAB — CBC
HCT: 28.3 % — ABNORMAL LOW (ref 36.0–46.0)
Hemoglobin: 9.2 g/dL — ABNORMAL LOW (ref 12.0–15.0)
MCH: 32.6 pg (ref 26.0–34.0)
MCHC: 32.5 g/dL (ref 30.0–36.0)
MCV: 100.4 fL — ABNORMAL HIGH (ref 78.0–100.0)
Platelets: 107 10*3/uL — ABNORMAL LOW (ref 150–400)
RBC: 2.82 MIL/uL — ABNORMAL LOW (ref 3.87–5.11)
RDW: 16.7 % — ABNORMAL HIGH (ref 11.5–15.5)
WBC: 9.8 10*3/uL (ref 4.0–10.5)

## 2016-05-20 LAB — GLUCOSE, CAPILLARY
Glucose-Capillary: 115 mg/dL — ABNORMAL HIGH (ref 65–99)
Glucose-Capillary: 126 mg/dL — ABNORMAL HIGH (ref 65–99)
Glucose-Capillary: 135 mg/dL — ABNORMAL HIGH (ref 65–99)
Glucose-Capillary: 125 mg/dL — ABNORMAL HIGH (ref 65–99)
Glucose-Capillary: 118 mg/dL — ABNORMAL HIGH (ref 65–99)

## 2016-05-20 MED ORDER — METOPROLOL TARTRATE 25 MG/10 ML ORAL SUSPENSION
25.0000 mg | Freq: Two times a day (BID) | ORAL | Status: DC
  Administered 2016-05-20 – 2016-05-21 (×2): 25 mg via ORAL
  Filled 2016-05-20 (×2): qty 10

## 2016-05-20 MED ORDER — LEVALBUTEROL HCL 0.63 MG/3ML IN NEBU
0.6300 mg | INHALATION_SOLUTION | Freq: Two times a day (BID) | RESPIRATORY_TRACT | Status: DC
  Administered 2016-05-21: 0.63 mg via RESPIRATORY_TRACT
  Filled 2016-05-20: qty 3

## 2016-05-20 MED ORDER — HYDROCODONE-ACETAMINOPHEN 5-325 MG PO TABS
1.0000 | ORAL_TABLET | Freq: Three times a day (TID) | ORAL | Status: DC | PRN
  Administered 2016-05-20 (×2): 1 via ORAL
  Filled 2016-05-20 (×2): qty 1

## 2016-05-20 MED ORDER — IPRATROPIUM BROMIDE 0.02 % IN SOLN
0.5000 mg | Freq: Two times a day (BID) | RESPIRATORY_TRACT | Status: DC
  Administered 2016-05-21: 0.5 mg via RESPIRATORY_TRACT
  Filled 2016-05-20: qty 2.5

## 2016-05-20 MED ORDER — DEXTROSE-NACL 5-0.9 % IV SOLN
INTRAVENOUS | Status: DC
  Administered 2016-05-20 – 2016-05-21 (×2): via INTRAVENOUS

## 2016-05-20 MED ORDER — ENSURE ENLIVE PO LIQD: 237.0000 mL | ORAL | Status: DC

## 2016-05-20 MED ORDER — CLOBETASOL PROPIONATE 0.05 % EX CREA
1.0000 "application " | TOPICAL_CREAM | Freq: Two times a day (BID) | CUTANEOUS | Status: DC
  Administered 2016-05-20 – 2016-05-21 (×2): 1 via TOPICAL
  Filled 2016-05-20: qty 15

## 2016-05-20 NOTE — Progress Notes (Signed)
CSW met with pt / sister at bedside. They have chosen [facility] for [employer]. CSW has left VM for Admissions at [facility]. Awaiting return call to confirm bed availability. CSW will continue to follow to assist with d/c planning needs.  Werner Lean LCSW [phone]

## 2016-05-20 NOTE — Evaluation (Signed)
Occupational Therapy Evaluation Patient Details Name: Gabrielle PalauVirginia W [name] MRN: [id number] DOB: 06-30-1945 Today's Date: 05/20/2016    History of Present Illness 71 y.o.femalewith history of CAD status post CABG, diastolic CHF, hypertension, atrial fibrillation not on anticoagulation secondary to severe GI bleed, tachybradycardia syndrome status post pacemaker placement was brought to the ERsecondary to confusion. Dx of encephalopathy, UTI,  hypotension, respiratory failure.   Clinical Impression   Pt was admitted for the above. At baseline, she lives independently and does not drive.  She currently needs min A +2 for safety. She will benefit from continued OT in acute as well as follow up OT at SNF to restore independence.  Goals in acute are for min guard level overall    Follow Up Recommendations  SNF    Equipment Recommendations  None recommended by OT    Recommendations for Other Services       Precautions / Restrictions Precautions Precautions: Fall Precaution Comments: son reports pt has h/o several falls Restrictions Weight Bearing Restrictions: No      Mobility Bed Mobility Overal bed mobility: Needs Assistance Bed Mobility: Supine to Sit     Supine to sit: Min assist;+2 for safety/equipment;+2 for physical assistance     General bed mobility comments: assist for trunk  Transfers Overall transfer level: Needs assistance Equipment used: Rolling walker (2 wheeled) Transfers: Sit to/from [employer] CorporationStand;Stand Pivot Transfers Sit to Stand: Min assist;+2 physical assistance;+2 safety/equipment;From elevated surface Stand pivot transfers: +2 safety/equipment;Min assist       General transfer comment: min A to rise, VCs hand placement, min A to maneuver RW for SPT to recliner, VCs posture    Balance Overall balance assessment: Needs assistance Sitting-balance support: Feet supported;Bilateral upper extremity supported Sitting balance-Leahy Scale: Good     Standing  balance support: Bilateral upper extremity supported Standing balance-Leahy Scale: Poor                              ADL Overall ADL's : Needs assistance/impaired Eating/Feeding: Independent   Grooming: Min guard;Wash/dry hands;Standing   Upper Body Bathing: Set up;Sitting   Lower Body Bathing: Minimal assistance;Sit to/from stand   Upper Body Dressing : Minimal assistance;Sitting Upper Body Dressing Details (indicate cue type and reason): lines Lower Body Dressing: Minimal assistance;+2 for safety/equipment;Sit to/from stand   Toilet Transfer: Minimal assistance;+2 for safety/equipment;Stand-pivot;BSC;RW   Toileting- Clothing Manipulation and Hygiene: Minimal assistance;Sit to/from stand         General ADL Comments: Pt needs min A overall for ADLs, +2 for safety/lines.      Vision         Perception     Praxis      Pertinent Vitals/Pain Pain Assessment: Faces Faces Pain Scale: Hurts a little bit Pain Location: back pain Pain Descriptors / Indicators: Aching Pain Intervention(s): Limited activity within patient's tolerance;Monitored during session;Repositioned     Hand Dominance     Extremity/Trunk Assessment Upper Extremity Assessment Upper Extremity Assessment: Generalized weakness           Communication Communication Communication: No difficulties   Cognition Arousal/Alertness: Awake/alert Behavior During Therapy: WFL for tasks assessed/performed Overall Cognitive Status: Within Functional Limits for tasks assessed                     General Comments       Exercises       Shoulder Instructions      Home Living Family/patient expects to  be discharged to:: Skilled nursing facility Living Arrangements: Alone                 Bathroom Shower/Tub: Walk-in Human resources officer: Standard     Home Equipment: Cane - single point;Bedside commode;Walker - 2 wheels;Shower seat          Prior  Functioning/Environment                   OT Problem List: Decreased strength;Decreased activity tolerance;Impaired balance (sitting and/or standing);Pain;Decreased knowledge of use of DME or AE      OT Treatment/Interventions: Self-care/ADL training;Therapeutic activities;Balance training;Patient/family education    OT Goals(Current goals can be found in the care plan section) Acute Rehab OT Goals Patient Stated Goal: get home to her dog OT Goal Formulation: With patient Time For Goal Achievement: 06/03/16 Potential to Achieve Goals: Good  OT Frequency: Min 2X/week   Barriers to D/C:            Co-evaluation   Reason for Co-Treatment: For patient/therapist safety PT goals addressed during session: Mobility/safety with mobility OT goals addressed during session: ADL's and self-care      End of Session Equipment Utilized During Treatment: Gait belt  Activity Tolerance: Patient tolerated treatment well Patient left: in chair;with call bell/phone within reach;with family/visitor present;with chair alarm set  OT Visit Diagnosis: Unsteadiness on feet (R26.81)                ADL either performed or assessed with clinical judgement  Time: 1610-9604 OT Time Calculation (min): 27 min Charges:  OT General Charges $OT Visit: 1 Procedure OT Evaluation $OT Eval Moderate Complexity: 1 Procedure G-Codes:     [place], OTR/L [phone] 05/20/2016  Gabrielle [name] 05/20/2016, 1:16 PM

## 2016-05-20 NOTE — Progress Notes (Signed)
PROGRESS NOTE    Gabrielle [name]  ZOX:[id number] DOB: 1945-08-07 DOA: 05/11/2016 PCP: Irving CopasHACKER,ROBERT KELLER, MD    Brief Narrative: 71 y.o.femalewith history of CAD status post CABG, diastolic CHF, hypertension, atrial fibrillation not on anticoagulation secondary to severe GI bleed, tachybradycardia syndrome status post pacemaker placement was brought to the ER secondary to confusion.  Patient admitted with AMS, confusion, agitation. Subsequently develops SVT. She didn't respond to haldol, or ativan. She was treated with precex gtt. She became hypotensive from medications and was started on pressors. There was consideration regarding meningitis but after neurology evaluation, meningitis was less likely. EEG negative for seizure. The reason for AMS is unclear, could be related to medications effect, unclear what medications she was taking, Infection UTI.     Assessment & Plan:   Principal Problem:   Acute encephalopathy Active Problems:   Essential hypertension   PAF (paroxysmal atrial fibrillation) (HCC)   Tachy-brady syndrome (HCC)   Hypokalemia   Encounter for central line care   Acute renal failure (ARF) (HCC)   Protein-calorie malnutrition, severe   H/O: GI bleed   Physical deconditioning   Dementia with behavioral disturbance   Acute encephalopathy; presumed to be toxic/metabolic -improving -Might be related to infection, UTI vs drug toxicity vs WD on top of superimposed dementia.  -Patient was on precedex Gtt, which help with agitation.  -Started on Seroquel.  -continue Ativan PRN (minimizing benzos).  -Ammonia was mildly elevated at 60 on admission; improved with use of lactulose. -Continue with support care and follow clinical response -will transfer to telemetry bed -per PT will benefit of SNF.   Hypotension;  -Related to medication/precedex. She required pressors.  -after gentle IVF's, BP improved -soft, but stable currently  Acute Hypoxic respiratory  failure.  -with concerns for related pulmonary vascular congestion  -Chest x ray bilateral effusion.   -improved with IV lasix; now diuretics on hold due to hypotension  -will monitor and resume diuretics when possible -continue oxygen supplementation as needed and weaned as tolerated   UTI;  -treated with IV ceftriaxone  -patients denies dysuria  -Paroxysmal atrial fibrillation, SVT; -Had two episode of SVT , patient was noticed to be hypoxic prior to one of the episodes.  -not on anticoagulation given hx of severe GI bleed -CHADsVASC score 4 -monitor on telemetry  -On oral metoprolol, dose adjusted due to low BP.  -Cardiology following.   -Hypokalemia and low magnesium  -resolved.   -AKI: improved.  -hypovolemia and Infection as reason for AKI.  -Improved.  -US; left nephrolithiasis, no obstruction.  -will follow trend  Acute on chronic diastolic heart failure -due to IVF's and rate/rhythm abnormality  received lasix; holding it now due to soft BP -better -will follow daily weight and strict I's and O's  -Hypernatremia -resolved with IV fluids.  -will monitor electrolytes   -Cirrhosis. Transaminases;  -Outpatient follow up.  -US liver; finding suggest cirrhosis.  -holding lactulose (given already ongoing diarrhea and no signs of encephalopathy now) -patient also with hypovolemia   DVT prophylaxis: Heparin  Code Status:  DNR Family Communication: none at bedside.  Disposition Plan: will transfer to telemetry unit. Weaned oxygen, transition to PO meds and follow clinical response.   Consultants:   CCM  Neurology  Cardiology    Procedures:  EEG    Antimicrobials:  Ceftriaxone   Subjective: AAOX3; She is tolerating diet. No nausea, no vomiting. afebrile.  Objective: Vitals:   05/20/16 0758 05/20/16 0800 05/20/16 0812 05/20/16 0818  BP:   Marland Kitchen(!)  115/38   Pulse: 80 79 80 83  Resp: 18 18 15 18   Temp:  98 F (36.7 C)    TempSrc:  Axillary      SpO2: 100% 100% 93% (!) 86%  Weight:      Height:        Intake/Output Summary (Last 24 hours) at 05/20/16 1112 Last data filed at 05/20/16 0800  Gross per 24 hour  Intake             1635 ml  Output              800 ml  Net              835 ml   Filed Weights   05/18/16 0500 05/19/16 0438 05/20/16 0500  Weight: 78.7 kg (173 lb 8 oz) 80.6 kg (177 lb 11.1 oz) 77.8 kg (171 lb 8.3 oz)    Examination:  General exam: alert, oriented to place, person and place; no acute complaints. Still requiring oxygen supplementation. No nausea, no vomiting. Tolerating diet. Respiratory system: Clear to auscultation. Respiratory effort normal. Cardiovascular system: Irregular, no rubs, no gallops. Gastrointestinal system: Abdomen is nondistended, soft and nontender. No organomegaly or masses felt. Normal bowel sounds heard. Central nervous system: alert , follows command properly.  Extremities: Symmetric 5 x 5 power. Skin: No rashes, lesions or ulcers Psychiatry: no SI or hallucinations    Data Reviewed: I have personally reviewed following labs and imaging studies  CBC:  Recent Labs Lab 05/16/16 0402 05/17/16 0354 05/18/16 0532 05/19/16 0400 05/20/16 0329  WBC 7.6 10.1 10.6* 10.6* 9.8  HGB 8.8* 9.4* 9.5* 9.9* 9.2*  HCT 27.6* 28.9* 29.5* 30.4* 28.3*  MCV 100.4* 100.0 98.7 99.0 100.4*  PLT 76* 83* 95* 97* 107*   Basic Metabolic Panel:  Recent Labs Lab 05/14/16 0850 05/14/16 1827 05/15/16 0455 05/15/16 1712  05/16/16 1717 05/17/16 0354 05/18/16 0532 05/19/16 0400 05/20/16 0329  NA 146*  --  144  --   < > 144 145 141 142 144  K 3.4*  --  4.3  --   < > 3.8 3.7 4.1 3.9 3.8  CL 114*  --  114*  --   < > 110 112* 105 103 106  CO2 27  --  27  --   < > 28 29 33* 34* 32  GLUCOSE 131*  --  115*  --   < > 169* 124* 138* 112* 120*  BUN 20  --  21*  --   < > 19 16 13 14 15   CREATININE 1.21*  --  1.03*  --   < > 0.89 0.75 0.72 0.78 0.72  CALCIUM 8.5*  --  8.5*  --   < > 9.2 9.2 9.4  9.2 8.8*  MG 2.0 1.9 2.0 1.9  --  1.9 2.1 1.9  --   --   PHOS 1.8* 1.4* 1.6* 2.7  --   --  2.6  --   --   --   < > = values in this interval not displayed. GFR: Estimated Creatinine Clearance: 61.4 mL/min (by C-G formula based on SCr of 0.72 mg/dL).   Liver Function Tests:  Recent Labs Lab 05/14/16 0850 05/15/16 0455  AST 76* 57*  ALT 16 16  ALKPHOS 53 56  BILITOT 2.3* 1.4*  PROT 5.7* 5.3*  ALBUMIN 3.4* 3.0*    Recent Labs Lab 05/13/16 1415 05/15/16 0455  AMMONIA 60* 37*   Cardiac Enzymes:  Recent Labs  Lab 05/16/16 1717 05/17/16 0030 05/17/16 0354  TROPONINI 0.20* 0.19* 0.18*   CBG:  Recent Labs Lab 05/19/16 1619 05/19/16 1907 05/19/16 2328 05/20/16 0346 05/20/16 0828  GLUCAP 112* 135* 125* 115* 126*   Sepsis Labs:  Recent Labs Lab 05/13/16 1229  LATICACIDVEN 4.4*    Recent Results (from the past 240 hour(s))  Urine culture     Status: Abnormal   Collection Time: 05/11/16  8:25 PM  Result Value Ref Range Status   Specimen Description URINE, RANDOM  Final   Special Requests NONE  Final   Culture MULTIPLE SPECIES PRESENT, SUGGEST RECOLLECTION (A)  Final   Report Status 05/13/2016 FINAL  Final  MRSA PCR Screening     Status: None   Collection Time: 05/12/16 12:24 AM  Result Value Ref Range Status   MRSA by PCR NEGATIVE NEGATIVE Final    Comment:        The GeneXpert MRSA Assay (FDA approved for NASAL specimens only), is one component of a comprehensive MRSA colonization surveillance program. It is not intended to diagnose MRSA infection nor to guide or monitor treatment for MRSA infections.   Culture, blood (Routine X 2) w Reflex to ID Panel     Status: None   Collection Time: 05/12/16  8:45 AM  Result Value Ref Range Status   Specimen Description BLOOD RIGHT ARM  Final   Special Requests BOTTLES DRAWN AEROBIC ONLY 5CC  Final   Culture   Final    NO GROWTH 5 DAYS Performed at [facility], [address] [place], [place]  [address]    Report Status 05/17/2016 FINAL  Final  Culture, blood (Routine X 2) w Reflex to ID Panel     Status: None   Collection Time: 05/12/16  8:46 AM  Result Value Ref Range Status   Specimen Description BLOOD RIGHT HAND  Final   Special Requests IN PEDIATRIC BOTTLE 1.5CC  Final   Culture   Final    NO GROWTH 5 DAYS Performed at [facility], [address] [place], [place] [address]    Report Status 05/17/2016 FINAL  Final  Culture, Urine     Status: None   Collection Time: 05/13/16 10:20 PM  Result Value Ref Range Status   Specimen Description URINE, RANDOM  Final   Special Requests NONE  Final   Culture   Final    NO GROWTH Performed at [facility], [address], [place], [place] [address]    Report Status 05/15/2016 FINAL  Final  Culture, Urine     Status: None   Collection Time: 05/14/16 10:00 AM  Result Value Ref Range Status   Specimen Description URINE, CATHETERIZED  Final   Special Requests NONE  Final   Culture   Final    NO GROWTH Performed at [facility], [address], [place], [place] [address]    Report Status 05/15/2016 FINAL  Final      Radiology Studies: No results found.   Scheduled Meds: . aspirin  81 mg Per Tube Daily  . budesonide (PULMICORT) nebulizer solution  0.5 mg Nebulization BID  . chlorhexidine  15 mL Mouth Rinse BID  . fluticasone  2 spray Each Nare Daily  . heparin subcutaneous  5,000 Units Subcutaneous Q8H  . ipratropium  0.5 mg Nebulization TID  . levalbuterol  0.63 mg Nebulization TID  . mouth rinse  15 mL Mouth Rinse BID  . metoprolol tartrate  50  mg Oral BID  . QUEtiapine  25 mg Per Tube QHS  . thiamine  100 mg Per Tube Daily  . Vitamin D (Ergocalciferol)  50,000 Units Oral Q7 days   Continuous Infusions: . dextrose 5 % and 0.9% NaCl 75 mL/hr at 05/20/16 0800     LOS: 8 days    Time spent: 25 minutes.     Vassie Loll, MD [facility] Pager [phone]  If 7PM-7AM, please  contact night-coverage [email or web address] Password [email or web address] 05/20/2016, 11:12 AM

## 2016-05-20 NOTE — Progress Notes (Signed)
Nutrition Follow-up  DOCUMENTATION CODES:   Severe malnutrition in context of social or environmental circumstances, Obesity unspecified  INTERVENTION:  - Will order Ensure Enlive once/day, this supplement provides 350 kcal and 20 grams of protein - Continue to encourage PO intakes of meals and supplement. - RD will continue to monitor for additional needs.  NUTRITION DIAGNOSIS:   Inadequate oral intake related to poor appetite, acute illness as evidenced by per patient/family report.  GOAL:   Patient will meet greater than or equal to 90% of their needs  MONITOR:   PO intake, Supplement acceptance, Weight trends, Labs, I & O's  ASSESSMENT:   71 y.o. female with PMH of CAD s/p CABG, diastolic CHF, HTN, atrial fibrillation not on anticoagulation secondary to severe GI bleed, tachybradycardia syndrome s/p pacemaker placement presents with acute encephalopathy after being found to have increasing confusion by pt's sister. Pt also complaining of low back pain  3/21 NGT removed and diet advanced to FLD on 3/19 @ 1030 and then to Dysphagia 3, thin liquids yesterday at 1315. Per chart review, pt consumed 75% of FLD for lunch yesterday. Sister, who is at bedside, reports that pt ate a few bites of dinner last night without any issue. Pt reports that her appetite is returning and she is feeling hungry at this time. RN recently ordered lunch for pt. RN reported that pt consumed ~20% of breakfast this AM. Sister states pt drinks more fluids than eating solid foods and sister is concerned about this in terms of pt retaining fluid. Will order Ensure once/day to help with nutrition needs. Pt is not fully appropriate for diet education related to fluids at this time as she just walked with PT and is feeling rather lethargic. Per chart review, CBW is +2.6 kg from admission; will continue to monitor.  Medications reviewed; 100 mg thiamine/day, 50000 units vitamin D every 7 days.  Labs reviewed; CBGs:  115 and 126 mg/dL today.  IVF: D5-NS @ 75 mL/hr (306 kcal).    3/16 - Pt remains sedated and did not communicate at time of visit.  - Pt's TF is at goal rate and pt seems to be tolerating well: Vital AF 1.2 at 50 mL/hr and Pro-stat q 24 hours, to provide 1540 calories, 105 grams protein, and 972 mL free water per day.  - Electrolytes seemed balanced except phosphorus level is slightly low.  - Per chart review pt's stomach is soft not distended, with active bowel sounds.  - Pt's twin sister was at bedside to assist in collecting pt's nutritional history.  - Per pt's sister pt would consume maybe one meal/d or less for a 1 month period or more.  - Pt's sister buys her groceries and brings them to her as pt lives alone and does not leave the house d/t stairs and decreased mobility.  - Sister reports recent weight loss for pt but unsure of amount.  - Sister reported pt's UBW is around 190-200 lbs and she weighed this in August/September. - Pt has experienced weight loss over the past 6 months.  - Pt weighed 200 lbs in September and currently weighs 174 lbs, 26 lb weight loss (13% in 6 months-significant for time frame)    Diet Order:  DIET DYS 3 Room service appropriate? Yes; Fluid consistency: Thin  Skin:  Reviewed, no issues  Last BM:  3/20  Height:   Ht Readings from Last 1 Encounters:  05/12/16 5\' 2"  (1.575 m)    Weight:   Wt  Readings from Last 1 Encounters:  05/20/16 171 lb 8.3 oz (77.8 kg)    Ideal Body Weight:  50 kg  BMI:  Body mass index is 31.37 kg/m.  Estimated Nutritional Needs:   Kcal:  1500-1700  Protein:  95-110 grams (1.2-1.4 g/kg)  Fluid:  >/= 2 L/d  EDUCATION NEEDS:   No education needs identified at this time    Trenton GammonJessica Amonda Brillhart, MS, RD, LDN, CNSC Inpatient Clinical Dietitian Pager # [phone] After hours/weekend pager # [phone]

## 2016-05-20 NOTE — Progress Notes (Signed)
Progress Note  Patient Name: Gabrielle [name] Date of Encounter: 05/20/2016  Primary Cardiologist: Anne Fu  Subjective   Awake eating breakfast. Reports lower back pain, states this is chronic.   Inpatient Medications    Scheduled Meds: . aspirin  81 mg Per Tube Daily  . budesonide (PULMICORT) nebulizer solution  0.5 mg Nebulization BID  . chlorhexidine  15 mL Mouth Rinse BID  . fluticasone  2 spray Each Nare Daily  . heparin subcutaneous  5,000 Units Subcutaneous Q8H  . ipratropium  0.5 mg Nebulization TID  . levalbuterol  0.63 mg Nebulization TID  . mouth rinse  15 mL Mouth Rinse BID  . metoprolol tartrate  50 mg Oral BID  . QUEtiapine  25 mg Per Tube QHS  . thiamine  100 mg Per Tube Daily  . Vitamin D (Ergocalciferol)  50,000 Units Oral Q7 days   Continuous Infusions: . dextrose 5 % and 0.9% NaCl 75 mL/hr at 05/20/16 0800   PRN Meds: acetaminophen **OR** acetaminophen, levalbuterol, LORazepam, ondansetron (ZOFRAN) IV   Vital Signs    Vitals:   05/20/16 0758 05/20/16 0800 05/20/16 0812 05/20/16 0818  BP:   (!) 115/38   Pulse: 80 79 80 83  Resp: 18 18 15 18   Temp:  98 F (36.7 C)    TempSrc:  Axillary    SpO2: 100% 100% 93% (!) 86%  Weight:      Height:        Intake/Output Summary (Last 24 hours) at 05/20/16 1010 Last data filed at 05/20/16 0800  Gross per 24 hour  Intake             1710 ml  Output              800 ml  Net              910 ml   Filed Weights   05/18/16 0500 05/19/16 0438 05/20/16 0500  Weight: 173 lb 8 oz (78.7 kg) 177 lb 11.1 oz (80.6 kg) 171 lb 8.3 oz (77.8 kg)    Telemetry    SR with episodes of SVT, WCT - Personally Reviewed  ECG    N/a - Personally Reviewed  Physical Exam   General: Well developed, well nourished, female appearing in no acute distress. Head: Normocephalic, atraumatic.  Neck: Supple without bruits, JVD. Lungs:  Resp regular and unlabored, CTA. Heart: RRR, S1, S2, no S3, S4, or murmur; no  rub. Abdomen: Soft, non-tender, non-distended with normoactive bowel sounds. No hepatomegaly. No rebound/guarding. No obvious abdominal masses. Extremities: No clubbing, cyanosis, edema. Distal pedal pulses are 2+ bilaterally. Neuro: Alert and oriented X 3. Moves all extremities spontaneously. Psych: Normal affect.  Labs    Chemistry Recent Labs Lab 05/14/16 0850 05/15/16 0455  05/18/16 0532 05/19/16 0400 05/20/16 0329  NA 146* 144  < > 141 142 144  K 3.4* 4.3  < > 4.1 3.9 3.8  CL 114* 114*  < > 105 103 106  CO2 27 27  < > 33* 34* 32  GLUCOSE 131* 115*  < > 138* 112* 120*  BUN 20 21*  < > 13 14 15   CREATININE 1.21* 1.03*  < > 0.72 0.78 0.72  CALCIUM 8.5* 8.5*  < > 9.4 9.2 8.8*  PROT 5.7* 5.3*  --   --   --   --   ALBUMIN 3.4* 3.0*  --   --   --   --   AST 76* 57*  --   --   --   --  ALT 16 16  --   --   --   --   ALKPHOS 53 56  --   --   --   --   BILITOT 2.3* 1.4*  --   --   --   --   GFRNONAA 44* 53*  < > >60 >60 >60  GFRAA 51* >60  < > >60 >60 >60  ANIONGAP 5 3*  < > 3* 5 6  < > = values in this interval not displayed.   Hematology Recent Labs Lab 05/18/16 0532 05/19/16 0400 05/20/16 0329  WBC 10.6* 10.6* 9.8  RBC 2.99* 3.07* 2.82*  HGB 9.5* 9.9* 9.2*  HCT 29.5* 30.4* 28.3*  MCV 98.7 99.0 100.4*  MCH 31.8 32.2 32.6  MCHC 32.2 32.6 32.5  RDW 16.3* 16.4* 16.7*  PLT 95* 97* 107*    Cardiac Enzymes Recent Labs Lab 05/16/16 1717 05/17/16 0030 05/17/16 0354  TROPONINI 0.20* 0.19* 0.18*   No results for input(s): TROPIPOC in the last 168 hours.   BNPNo results for input(s): BNP, PROBNP in the last 168 hours.   DDimer No results for input(s): DDIMER in the last 168 hours.    Radiology    No results found.  Cardiac Studies   TTE: 05/17/16  Study Conclusions  - Left ventricle: The cavity size was mildly dilated. Systolic function was normal. The estimated ejection fraction was 55%. There is akinesis of the apical myocardium. There is  akinesis of the basalinferior myocardium. There is akinesis of the apicalanterior myocardium. There was a reduced contribution of atrial contraction to ventricular filling, due to increased ventricular diastolic pressure or atrial contractile dysfunction. Doppler parameters are consistent with a reversible restrictive pattern, indicative of decreased left ventricular diastolic compliance and/or increased left atrial pressure (grade 3 diastolic dysfunction). Doppler parameters are consistent with high ventricular filling pressure. - Aortic valve: There was mild regurgitation. - Mitral valve: Severely calcified annulus. There was moderate regurgitation directed posteriorly. Mean gradient (D): 7 mm Hg. - Left atrium: The atrium was mildly dilated. - Right ventricle: Pacer wire or catheter noted in right ventricle. - Right atrium: The atrium was mildly dilated. - Tricuspid valve: There was moderate regurgitation. - Pulmonic valve: There was mild regurgitation. - Pulmonary arteries: PA peak pressure: 59 mm Hg (S).  Impressions:  - The right ventricular systolic pressure was increased consistent with moderate pulmonary hypertension.  Patient Profile     71 y.o. female history of CAD, s/p CABG x 7 in 2010, PAF with SSS, s/p BS PTVDP 12/26/15, prior GI bleeding- CHADs VASc=4 but not anticoagulated, HTN, and HLD. She was brought to the ED at [facility] 05/11/16 by the police after the family called concerned about increasing confusion and paranoid behavior. We were asked to see for tachycardia. The pt was completely uncooperative and combative and required IV Precedex for sedation so we could control her HR. Now awake and alert.   Assessment & Plan    1. AF RVR: Remains in SR but having episodes of SVT/WCT at times that spontaneously resolve. Currently on metoprolol 50mg  BID. Blood pressures have been soft, will hold on up titration at this time.  -- had a GID bleed in 2017,  but was cleared to resume Eliquis. Reports she never picked up her new Rx prior to this admission. ChadsVasc 4, Hgb stable around 9, and platelets improving.   2. Altered Mental Status: Now resolved.   3. CAD: S/P CABG in 2010. Echo this admission showed her EF  to be 55% grade 3 DD, moderate pulmonary HTN  4. Elevate Trop: Flat nonACS trend. No further work up at this time.   Signed, Laverda PageLindsay Roberts, NP  05/20/2016, 10:10 AM    I have examined the patient and reviewed assessment and plan and discussed with patient.  Agree with above as stated.  HR stable.  Appears to be junctional at times.  Continue current dose of metoprolol.  She has had altered mental status at times as well.  WOuld have to follow this before commiting to anticoagulation.   Lance MussJayadeep Marjorie Deprey

## 2016-05-20 NOTE — Progress Notes (Signed)
Physical Therapy Treatment Patient Details Name: Gabrielle [name] MRN: [id number] DOB: 1945-11-14 Today's Date: 05/20/2016    History of Present Illness 71 y.o.femalewith history of CAD status post CABG, diastolic CHF, hypertension, atrial fibrillation not on anticoagulation secondary to severe GI bleed, tachybradycardia syndrome status post pacemaker placement was brought to the ERsecondary to confusion. Dx of encephalopathy, UTI,  hypotension, respiratory failure.    PT Comments    Pt demonstrating increased level of alertness and ability to participate this am and ambulated short distance in halls.  Follow Up Recommendations  SNF;Supervision/Assistance - 24 hour     Equipment Recommendations  None recommended by PT    Recommendations for Other Services       Precautions / Restrictions Precautions Precautions: Fall Precaution Comments: son reports pt has h/o several falls Restrictions Weight Bearing Restrictions: No    Mobility  Bed Mobility Overal bed mobility: Needs Assistance Bed Mobility: Supine to Sit     Supine to sit: Min assist;+2 for safety/equipment;+2 for physical assistance        Transfers Overall transfer level: Needs assistance Equipment used: Rolling walker (2 wheeled) Transfers: Sit to/from [employer] Sit to Stand: Min assist;+2 physical assistance;+2 safety/equipment;From elevated surface Stand pivot transfers: +2 safety/equipment;Min assist       General transfer comment: min A to rise, VCs hand placement, min A to maneuver RW for SPT to recliner, VCs posture  Ambulation/Gait Ambulation/Gait assistance: Min assist;+2 physical assistance;+2 safety/equipment Ambulation Distance (Feet): 58 Feet Assistive device: Rolling walker (2 wheeled) Gait Pattern/deviations: Step-through pattern;Decreased step length - right;Decreased step length - left;Shuffle;Trunk flexed Gait velocity: decr Gait velocity interpretation: Below  normal speed for age/gender General Gait Details: Increased time with cues for posture and position from RW   Stairs            Wheelchair Mobility    Modified Rankin (Stroke Patients Only)       Balance Overall balance assessment: Needs assistance Sitting-balance support: Feet supported;Bilateral upper extremity supported Sitting balance-Leahy Scale: Good     Standing balance support: Bilateral upper extremity supported Standing balance-Leahy Scale: Poor                      Cognition Arousal/Alertness: Awake/alert Behavior During Therapy: WFL for tasks assessed/performed Overall Cognitive Status: Within Functional Limits for tasks assessed                      Exercises      General Comments        Pertinent Vitals/Pain Pain Assessment: Faces Faces Pain Scale: Hurts a little bit Pain Location: back pain Pain Descriptors / Indicators: Aching Pain Intervention(s): Limited activity within patient's tolerance;Monitored during session    Home Living                      Prior Function            PT Goals (current goals can now be found in the care plan section) Acute Rehab PT Goals Patient Stated Goal: get home to her dog PT Goal Formulation: With patient/family Time For Goal Achievement: 06/01/16 Potential to Achieve Goals: Good Progress towards PT goals: Progressing toward goals    Frequency    Min 3X/week      PT Plan Current plan remains appropriate    Co-evaluation PT/OT/SLP Co-Evaluation/Treatment: Yes Reason for Co-Treatment: For patient/therapist safety PT goals addressed during session: Mobility/safety with mobility OT goals addressed during  session: ADL's and self-care     End of Session Equipment Utilized During Treatment: Gait belt;Oxygen Activity Tolerance: Patient limited by fatigue Patient left: in chair;with call bell/phone within reach;with family/visitor present Nurse Communication: Mobility  status PT Visit Diagnosis: History of falling (Z91.81);Other abnormalities of gait and mobility (R26.89)     Time: [id number] PT Time Calculation (min) (ACUTE ONLY): 29 min  Charges:  $Gait Training: 8-22 mins                    G Codes:       Mystique Bjelland 05/20/2016, 12:44 PM

## 2016-05-21 DIAGNOSIS — R0902 Hypoxemia: Secondary | ICD-10-CM

## 2016-05-21 DIAGNOSIS — I5032 Chronic diastolic (congestive) heart failure: Secondary | ICD-10-CM

## 2016-05-21 LAB — GLUCOSE, CAPILLARY
Glucose-Capillary: 102 mg/dL — ABNORMAL HIGH (ref 65–99)
Glucose-Capillary: 116 mg/dL — ABNORMAL HIGH (ref 65–99)
Glucose-Capillary: 134 mg/dL — ABNORMAL HIGH (ref 65–99)

## 2016-05-21 MED ORDER — ACETAMINOPHEN 325 MG PO TABS: 650.0000 mg | ORAL_TABLET | Freq: Four times a day (QID) | ORAL | Status: DC | PRN

## 2016-05-21 MED ORDER — ALPRAZOLAM 0.5 MG PO TABS: 0.5000 mg | ORAL_TABLET | Freq: Two times a day (BID) | ORAL | 0 refills | Status: DC | PRN

## 2016-05-21 MED ORDER — METOPROLOL TARTRATE 25 MG PO TABS: 25.0000 mg | ORAL_TABLET | Freq: Two times a day (BID) | ORAL | Status: DC

## 2016-05-21 MED ORDER — LOSARTAN POTASSIUM 50 MG PO TABS: 25.0000 mg | ORAL_TABLET | Freq: Every day | ORAL | 0 refills | Status: DC

## 2016-05-21 MED ORDER — RANITIDINE HCL 150 MG PO TABS: 150.0000 mg | ORAL_TABLET | Freq: Every day | ORAL | Status: DC

## 2016-05-21 MED ORDER — FUROSEMIDE 20 MG PO TABS: 20.0000 mg | ORAL_TABLET | Freq: Every day | ORAL | Status: DC

## 2016-05-21 MED ORDER — VITAMIN D (ERGOCALCIFEROL) 1.25 MG (50000 UNIT) PO CAPS: 50000.0000 [IU] | ORAL_CAPSULE | ORAL | Status: DC

## 2016-05-21 MED ORDER — CLOBETASOL PROPIONATE 0.05 % EX CREA: 1.0000 "application " | TOPICAL_CREAM | Freq: Two times a day (BID) | CUTANEOUS | Status: AC

## 2016-05-21 MED ORDER — QUETIAPINE FUMARATE 25 MG PO TABS: 25.0000 mg | ORAL_TABLET | Freq: Every day | ORAL | Status: DC

## 2016-05-21 MED ORDER — TRAMADOL HCL 50 MG PO TABS: 50.0000 mg | ORAL_TABLET | Freq: Three times a day (TID) | ORAL | 0 refills | Status: DC | PRN

## 2016-05-21 MED ORDER — ASPIRIN 81 MG PO CHEW: 81.0000 mg | CHEWABLE_TABLET | Freq: Every day | ORAL | Status: DC

## 2016-05-21 NOTE — Clinical Social Work Placement (Signed)
   CLINICAL SOCIAL WORK PLACEMENT  NOTE  Date:  05/21/2016  Patient Details  Name: Gabrielle [name] MRN: [id number] Date of Birth: 25-Feb-1946  Clinical Social Work is seeking post-discharge placement for this patient at the Skilled  Nursing Facility level of care (*CSW will initial, date and re-position this form in  chart as items are completed):  Yes   Patient/family provided with [facility] Clinical Social Work Department's list of facilities offering this level of care within the geographic area requested by the patient (or if unable, by the patient's family).  Yes   Patient/family informed of their freedom to choose among providers that offer the needed level of care, that participate in Medicare, Medicaid or managed care program needed by the patient, have an available bed and are willing to accept the patient.  Yes   Patient/family informed of 's ownership interest in [facility] and [facility], as well as of the fact that they are under no obligation to receive care at these facilities.  PASRR submitted to EDS on 05/21/16     PASRR number received on 05/21/16     Existing PASRR number confirmed on       FL2 transmitted to all facilities in geographic area requested by pt/family on 05/21/16     FL2 transmitted to all facilities within larger geographic area on       Patient informed that his/her managed care company has contracts with or will negotiate with certain facilities, including the following:        Yes   Patient/family informed of bed offers received.  [facility]     Physician recommends and patient chooses bed at      [facility] on 05[facility]21/16.  Patient to be transferred to facility by Sister     Patient family notified on 05/21/16 of transfer.  Name of family member notified:  Tilda FrancoJudy Wise     PHYSICIAN Please prepare priority discharge summary, including medications, Please prepare  prescriptions, Please sign FL2, Please sign DNR     Additional Comment:   Per MD patient ready for [facility] RN, patient, patient's family, and facility notified of DC. RN given number for report. Patient will be transported by sister to facility. RN aware that the patient must take DNR and signed prescriptions (narcotics) to the facility with her. CSW signing off.  _______________________________________________ Venita LickJoseph B Sunshine Mackowski, LCSW 05/21/2016, 2:55 PM

## 2016-05-21 NOTE — Care Management Note (Signed)
Case Management Note  Patient Details  Name: Gabrielle [name] MRN: [id number] Date of Birth: December 01, 1945  Subjective/Objective:                    Action/Plan:d/c SNF.   Expected Discharge Date:  05/21/16               Expected Discharge Plan:  Skilled Nursing Facility  In-House Referral:  Clinical Social Work  Discharge planning Services  CM Consult  Post Acute Care Choice:    Choice offered to:     DME Arranged:    DME Agency:     HH Arranged:    HH Agency:     Status of Service:  Completed, signed off  If discussed at [employer] Meetings, dates discussed:    Additional Comments:  Lanier ClamMahabir, Kora Groom, RN 05/21/2016, 2:26 PM

## 2016-05-21 NOTE — Discharge Summary (Signed)
Physician Discharge Summary  Gabrielle [name] WUJ:[id number] DOB: 25-Mar-1945 DOA: 05/11/2016  PCP: Gabrielle Copas, MD  Admit date: 05/11/2016 Discharge date: 05/21/2016  Time spent: 35 minutes  Recommendations for Outpatient Follow-up:  Follow daily weights and low sodium diet (less than 2 gram daily) Repeat BMET in 5 days to follow electrolytes and renal function  Repeat CBC to follow Hgb trend  Discharge Diagnoses:  Principal Problem:   Acute encephalopathy Active Problems:   Essential hypertension   PAF (paroxysmal atrial fibrillation) (HCC)   Tachy-brady syndrome (HCC)   Hypokalemia   Encounter for central line care   Acute renal failure (ARF) (HCC)   Protein-calorie malnutrition, severe   H/O: GI bleed   Physical deconditioning   Dementia with behavioral disturbance   Chronic diastolic CHF (congestive heart failure) (HCC)   Hypoxemia   Discharge Condition: stable and improved. Discharge to SNF for physical rehabilitation and further care.  Diet recommendation: heart healthy/low sodium diet   Filed Weights   05/19/16 0438 05/20/16 0500 05/21/16 0415  Weight: 80.6 kg (177 lb 11.1 oz) 77.8 kg (171 lb 8.3 oz) 81 kg (178 lb 9.2 oz)    History of present illness:  As per Dr. [name] H&P written on 05/11/16 71 y.o. female with history of CAD status post CABG, diastolic CHF, hypertension, atrial fibrillation not on anticoagulation secondary to severe GI bleed, tachybradycardia syndrome status post pacemaker placement was brought to the ER after patient was found to be increasingly confused by patient's sister. Patient's sister states that last week patient was found to be mildly confused and patient's other family member did find patient talking nonsensical. Today patient's sister came back from travel and check done patient when patient was found to be naked walking around at home confused and talking about family members being dead. Patient also is complaining of low  back pain. As per patient's sister patient does not drink alcohol and had quit smoking cigarettes 2 months ago. ED Course: In the ER CT head was unremarkable. Chest x-ray did not show any definite infiltrates. UA shows leukocyte esterase positive. Blood cultures are obtained. Patient will be admitted for further observation since patient is confused. On exam patient is alert awake but not oriented to name place person or time. Moves all extremities.  Hospital Course:  Acute encephalopathy; presumed to be toxic/metabolic -improving/resolved -Might be related to infection, UTI vs drug toxicity vs WD on superimposed dementia.  -Patient required precedex Gtt, which help with agitation and was Started on Seroquel QHS.  -continue Ativan PRN (minimizing benzos).  -Ammonia was mildly elevated at 60 on admission; improved with use of lactulose. -Continue with support care and follow clinical response -per PT will benefit of SNF for rehab.   Hypotension;  -Related to medication/precedex. She required pressors.  -after gentle IVF's, BP improved and rising at discharge -discharge on adjusted antihypertensive regimen   Acute Hypoxic respiratory failure.  -with concerns for related pulmonary vascular congestion  -Chest x ray demonstrated bilateral effusion and vascular congestion.   -improved with IV lasix; now diuretics resumed PO, but at lower dose -will recommend low sodium diet and close follow up of volume status -no needs for oxygen supplementation at discharge   UTI;  -Treated with IV ceftriaxone  -Patients denies dysuria, normal WBC's and no fever  -Paroxysmal atrial fibrillation, SVT; -Had two episode of SVT, patient was noticed to be hypoxic prior to one of the episodes.  -not on anticoagulation given hx of severe GI bleed -CHADsVASC  score 4 -On oral metoprolol, dose adjusted due to low BP.  -Cardiology saw patient during this hospitalization and will follow her as an outpatient   -will continue ASA  -Hypokalemia and low magnesium  -resolved with repletion.   -AKI: improved.  -hypovolemia and Infection as reason for AKI.  -Improved/resolved.  -[place]; left nephrolithiasis, no obstruction.  -will recommend BMET to follow Cr trend  Acute on chronic diastolic heart failure -due to IVF's and rate/rhythm abnormality  -received lasix and improved/resolved -will recommend low sodium diet -will resume lower dose of lasix and build up dose base on patient volume status  -Hypernatremia -resolved with IV fluids.  -will recommend BMET to monitor electrolytes   -Cirrhosis. Transaminases;  -Outpatient follow up.  -US liver finding suggested cirrhosis.  -holding lactulose (given already ongoing diarrhea and no signs of encephalopathy now) -patient also with hypovolemia  Procedures:  EEG  Consultations:  CCM  Neurology  Cardiology  Discharge Exam: Vitals:   05/21/16 1007 05/21/16 1334  BP: (!) 115/50 (!) 130/49  Pulse: 68 67  Resp:    Temp:  98.6 F (37 C)   General exam: alert, oriented to place, person and place currently; no acute complaints. Still requiring oxygen supplementation. No nausea, no vomiting. Tolerating diet. Respiratory system: Clear to auscultation. Respiratory effort normal. Cardiovascular system: Irregular, no rubs, no gallops. Gastrointestinal system: Abdomen is nondistended, soft and nontender. No organomegaly or masses felt. Normal bowel sounds heard. Central nervous system: alert , follows command properly.  Extremities: Symmetric 4 x 5 power. Skin: No rashes, lesions or ulcers Psychiatry: no SI or hallucinations   Discharge Instructions   Discharge Instructions    (HEART FAILURE PATIENTS) Call MD:  Anytime you have any of the following symptoms: 1) 3 pound weight gain in 24 hours or 5 pounds in 1 week 2) shortness of breath, with or without a dry hacking cough 3) swelling in the hands, feet or stomach 4) if you have to  sleep on extra pillows at night in order to breathe.    Complete by:  As directed    Diet - low sodium heart healthy    Complete by:  As directed    Discharge instructions    Complete by:  As directed    Maintain adequate hydration Physical rehabilitation as per SNF protocol Follow daily weights and low sodium diet (less than 2 gram daily) Repeat BMET in 5 days to follow electrolytes and renal function  Repeat CBC to follow Hgb trend     Current Discharge Medication List    START taking these medications   Details  acetaminophen (TYLENOL) 325 MG tablet Take 2 tablets (650 mg total) by mouth every 6 (six) hours as needed for mild pain (or Fever >/= 101).    aspirin 81 MG chewable tablet Place 1 tablet (81 mg total) into feeding tube daily.    metoprolol tartrate (LOPRESSOR) 25 MG tablet Take 1 tablet (25 mg total) by mouth 2 (two) times daily.    QUEtiapine (SEROQUEL) 25 MG tablet Place 1 tablet (25 mg total) into feeding tube at bedtime.    Vitamin D, Ergocalciferol, (DRISDOL) 50000 units CAPS capsule Take 1 capsule (50,000 Units total) by mouth every 7 (seven) days.      CONTINUE these medications which have CHANGED   Details  ALPRAZolam (XANAX) 0.5 MG tablet Take 1 tablet (0.5 mg total) by mouth every 12 (twelve) hours as needed (severe anxiety). Qty: 10 tablet, Refills: 0    clobetasol  cream (TEMOVATE) 0.05 % Apply 1 application topically 2 (two) times daily. Affected skin    furosemide (LASIX) 20 MG tablet Take 1 tablet (20 mg total) by mouth daily. Patient may take on additional one as needed for swelling    losartan (COZAAR) 50 MG tablet Take 0.5 tablets (25 mg total) by mouth daily. Qty: 30 tablet, Refills: 0    ranitidine (ZANTAC) 150 MG tablet Take 1 tablet (150 mg total) by mouth at bedtime.    traMADol (ULTRAM) 50 MG tablet Take 1 tablet (50 mg total) by mouth every 8 (eight) hours as needed for severe pain. Qty: 15 tablet, Refills: 0      CONTINUE these  medications which have NOT CHANGED   Details  albuterol (PROVENTIL HFA;VENTOLIN HFA) 108 (90 Base) MCG/ACT inhaler Inhale 2 puffs into the lungs every 6 (six) hours as needed for wheezing or shortness of breath.    atorvastatin (LIPITOR) 20 MG tablet Take 20 mg by mouth daily.    budesonide-formoterol (SYMBICORT) 160-4.5 MCG/ACT inhaler Inhale 2 puffs into the lungs 2 (two) times daily. Qty: 10.2 g, Refills: 6    docusate sodium (COLACE) 100 MG capsule Take 100 mg by mouth daily as needed for moderate constipation.     fluticasone (FLONASE) 50 MCG/ACT nasal spray Place 2 sprays into both nostrils daily. Qty: 16 g, Refills: 0    pantoprazole (PROTONIX) 40 MG tablet Take 40 mg by mouth daily before breakfast.     Polyethyl Glycol-Propyl Glycol (SYSTANE ULTRA) 0.4-0.3 % SOLN Place 1 drop into both eyes daily as needed (for dry eyes).     polyethylene glycol (MIRALAX / GLYCOLAX) packet Take 17 g by mouth daily as needed for mild constipation. Qty: 14 each, Refills: 0      STOP taking these medications     citalopram (CELEXA) 20 MG tablet      guaiFENesin (MUCINEX) 600 MG 12 hr tablet      metoprolol succinate (TOPROL-XL) 25 MG 24 hr tablet      oxymetazoline (AFRIN) 0.05 % nasal spray      sucralfate (CARAFATE) 1 GM/10ML suspension      tiZANidine (ZANAFLEX) 4 MG tablet        Allergies  Allergen Reactions  . Novocain [Procaine] Hives and Palpitations  . Penicillins Hives and Other (See Comments)    Has patient had a PCN reaction causing immediate rash, facial/tongue/throat swelling, SOB or lightheadedness with hypotension: No Has patient had a PCN reaction causing severe rash involving mucus membranes or skin necrosis: No Has patient had a PCN reaction that required hospitalization No Has patient had a PCN reaction occurring within the last 10 years: No If all of the above answers are "NO", then may proceed with Cephalosporin use.   Follow-up Information     Gabrielle CopasHACKER,ROBERT KELLER, MD. Schedule an appointment as soon as possible for a visit in 10 day(s).   Specialty:  Family Medicine Why:  after discharge from SNF Contact information: [address] [place] [address] [phone]           The results of significant diagnostics from this hospitalization (including imaging, microbiology, ancillary and laboratory) are listed below for reference.    Significant Diagnostic Studies: Dg Chest 1 View  Result Date: 05/11/2016 CLINICAL DATA:  Dementia/confusion with history of fall EXAM: CHEST 1 VIEW COMPARISON:  12/27/2015 FINDINGS: Post sternotomy changes are again evident. There is a left-sided duo lead pacing device with leads over the right atrium  and right ventricle. The lungs are slightly hyperinflated. There is no acute infiltrate or effusion. No pneumothorax. Stable cardiomegaly with central vascular congestion. Aortic atherosclerosis. IMPRESSION: 1. Cardiomegaly with mild central congestion.  No overt edema 2. No acute infiltrate Electronically Signed   By: Jasmine Pang M.D.   On: 05/11/2016 23:39   Dg Lumbar Spine 2-3 Views  Result Date: 05/11/2016 CLINICAL DATA:  Increased confusion with history of fall EXAM: LUMBAR SPINE - 2-3 VIEW COMPARISON:  12/16/2015 FINDINGS: Five non rib-bearing lumbar type vertebra. SI joints are patent. Lumbar alignment stable. Mild superior endplate compression deformity of L3 minimally progressed. New mild compression deformity of T12 with approximately 25% loss of height anteriorly. Remaining vertebral bodies appear grossly maintained. Mild to moderate narrowing at L2-L3 with mild narrowing at L3-L4, L4-L5 and L5-S1. Dense atherosclerosis of the aorta IMPRESSION: 1. Slight progression of previously noted superior endplate deformity at L3 2. New mild compression deformity of T12 Electronically Signed   By: Jasmine Pang M.D.   On: 05/11/2016 23:37   Dg Pelvis 1-2 Views  Result Date:  05/11/2016 CLINICAL DATA:  Increased confusion with history of fall EXAM: PELVIS - 1-2 VIEW COMPARISON:  12/16/2015 FINDINGS: There is no evidence of pelvic fracture or diastasis. No pelvic bone lesions are seen. Mild arthritis of the bilateral hips. IMPRESSION: No acute osseous abnormality Electronically Signed   By: Jasmine Pang M.D.   On: 05/11/2016 23:37   Dg Abd 1 View  Result Date: 05/16/2016 CLINICAL DATA:  NG tube placement EXAM: ABDOMEN - 1 VIEW COMPARISON:  Abdominal radiograph 05/14/2016 FINDINGS: Weighted enteric tube tip overlies the gastric body. Nonobstructive bowel gas pattern. IMPRESSION: Weighted enteric tube tip overlying the gastric body. Electronically Signed   By: Deatra Robinson M.D.   On: 05/16/2016 00:57   Dg Abd 1 View  Result Date: 05/14/2016 CLINICAL DATA:  71 y/o  F; feeding tube placement. EXAM: ABDOMEN - 1 VIEW COMPARISON:  12/01/2010 abdomen radiographs. FINDINGS: The bowel gas pattern is normal. Kidneys largely obscured by bowel gas. Moderate volume of stool in the colon. Enteric tube tip projects over distal stomach. IMPRESSION: Normal bowel gas pattern. Moderate volume of stool in the colon. Enteric tube tip projects over distal stomach. Electronically Signed   By: Mitzi Hansen M.D.   On: 05/14/2016 19:22   Ct Head Wo Contrast  Result Date: 05/11/2016 CLINICAL DATA:  71 year old female with altered mental status. EXAM: CT HEAD WITHOUT CONTRAST TECHNIQUE: Contiguous axial images were obtained from the base of the skull through the vertex without intravenous contrast. COMPARISON:  None. FINDINGS: Brain: There is mild age-related atrophy and chronic microvascular ischemic changes. There is no acute intracranial hemorrhage. No mass effect or midline shift noted. No intra-axial fluid collection. Vascular: No hyperdense vessel or unexpected calcification. Skull: Normal. Negative for fracture or focal lesion. Sinuses/Orbits: No acute finding. Other: None.  IMPRESSION: No acute intracranial hemorrhage. Mild age-related atrophy and chronic microvascular ischemic disease. If symptoms persist and there are no contraindications, MRI may provide better evaluation if clinically indicated. Electronically Signed   By: Elgie Collard M.D.   On: 05/11/2016 21:00   US Abdomen Limited  Result Date: 05/13/2016 CLINICAL DATA:  Abnormal transaminases. EXAM: US ABDOMEN LIMITED - RIGHT UPPER QUADRANT COMPARISON:  Ultrasound 07/30/2015. FINDINGS: Gallbladder: No gallstones or wall thickening visualized. No sonographic Murphy sign noted by sonographer. Common bile duct: Diameter: 9.6 mm Liver: Liver has a echogenic echotexture with irregular contour. Findings suggest cirrhosis. No focal hepatic abnormality  identified. IMPRESSION: 1. Liver has an echogenic echotexture with irregular contour. Findings suggest cirrhosis. No focal hepatic abnormality identified. 2. Dilated common bile duct at 9.6 mm. No gallstones identified. MRCP can be obtained to further evaluate Electronically Signed   By: Maisie Fus  Register   On: 05/13/2016 11:40   US Renal Port  Result Date: 05/13/2016 CLINICAL DATA:  Acute renal failure EXAM: RENAL / URINARY TRACT ULTRASOUND COMPLETE COMPARISON:  None. FINDINGS: Right Kidney: Length: 12.2 cm. Echogenicity within normal limits. No mass or hydronephrosis visualized. Left Kidney: Length: 11.0 cm. Nonobstructing calculus in the mid LEFT renal cortex measures 10 mm. No hydronephrosis Bladder: Appears normal for degree of bladder distention. IMPRESSION: 1. LEFT nephrolithiasis. 2. No renal obstruction. Electronically Signed   By: Genevive Bi M.D.   On: 05/13/2016 21:35   Dg Chest Port 1 View  Result Date: 05/17/2016 CLINICAL DATA:  Hypoxia per ordering note; pt id checked with NT EXAM: PORTABLE CHEST 1 VIEW COMPARISON:  05/15/2016 FINDINGS: LEFT-sided pacemaker overlies stable cardiac silhouette. NG tube and central venous line are unchanged. There is  interval increase in bilateral small pleural effusions. No pneumothorax or pulmonary edema per IMPRESSION: 1. Stable support apparatus. 2. Increased bilateral pleural effusions. Electronically Signed   By: Genevive Bi M.D.   On: 05/17/2016 16:18   Dg Chest Port 1 View  Result Date: 05/15/2016 CLINICAL DATA:  Respiratory failure. EXAM: PORTABLE CHEST 1 VIEW COMPARISON:  05/13/2016. FINDINGS: Feeding tube noted with tip below left hemidiaphragm. Right IJ line stable position. Cardiac pacer with lead tips in right atrium right ventricle. Prior CABG. Cardiomegaly with normal pulmonary vascularity. Low lung volumes. Mild left base subsegmental atelectasis versus infiltrate. Small left pleural effusion IMPRESSION: 1. Feeding tube noted with tip below left hemidiaphragm. Right IJ line stable position. 2. Cardiac pacer stable position. Prior CABG. Stable cardiomegaly. 3. Low lung volumes. Mild left base subsegmental atelectasis. Mild infiltrate cannot be excluded. Small left pleural effusion cannot be excluded. Electronically Signed   By: Maisie Fus  Register   On: 05/15/2016 06:50   Dg Chest Port 1 View  Result Date: 05/13/2016 CLINICAL DATA:  Central line placement. EXAM: PORTABLE CHEST 1 VIEW COMPARISON:  05/13/2016 . FINDINGS: Right IJ line noted with tip over the superior vena cava. Mediastinum is stable. Cardiac pacer stable position. Prior CABG. Stable cardiomegaly. No focal infiltrate. No pleural effusion or pneumothorax . IMPRESSION: 1. Interim placement of right IJ line, its tip is projected over superior vena cava. No pneumothorax. 2. Cardiac pacer stable position. Prior CABG. Stable cardiomegaly. Electronically Signed   By: Maisie Fus  Register   On: 05/13/2016 15:19   Dg Chest Port 1 View  Result Date: 05/13/2016 CLINICAL DATA:  Hypoxia over night, episode of supraventricular tachycardia. Hypotension. EXAM: PORTABLE CHEST 1 VIEW COMPARISON:  05/11/2016. FINDINGS: Trachea is midline. Heart is enlarged.  Pacemaker lead tips project over the right atrium and right ventricle. Minimal linear atelectasis or scarring in the lingula. Lungs are otherwise clear. No pleural fluid. IMPRESSION: No acute findings. Electronically Signed   By: Leanna Battles M.D.   On: 05/13/2016 09:33   Dg Abd Portable 1v  Result Date: 05/17/2016 CLINICAL DATA:  Nasogastric tube placement. EXAM: PORTABLE ABDOMEN - 1 VIEW COMPARISON:  05/16/2016 FINDINGS: Enteric tube is present with tip over the stomach in the midline upper abdomen likely in distal stomach. Bowel gas pattern is nonobstructive. Remainder of the exam is unchanged. IMPRESSION: Nonobstructive bowel gas pattern. Nasogastric tube with tip over the midline upper abdomen  likely in the distal stomach. Electronically Signed   By: Elberta Fortis M.D.   On: 05/17/2016 08:06   Dg Abd Portable 1v  Result Date: 05/16/2016 CLINICAL DATA:  Nasogastric tube placement. EXAM: PORTABLE ABDOMEN - 1 VIEW COMPARISON:  Earlier today. FINDINGS: Feeding tube tip in the proximal stomach. No nasogastric tube is seen. Stable cardiac pacemaker leads. No dilated bowel loops. Lumbar spine degenerative changes. IMPRESSION: Feeding tube tip in the proximal stomach.  No nasogastric tube seen. Electronically Signed   By: Beckie Salts M.D.   On: 05/16/2016 14:12    Microbiology: Recent Results (from the past 240 hour(s))  Urine culture     Status: Abnormal   Collection Time: 05/11/16  8:25 PM  Result Value Ref Range Status   Specimen Description URINE, RANDOM  Final   Special Requests NONE  Final   Culture MULTIPLE SPECIES PRESENT, SUGGEST RECOLLECTION (A)  Final   Report Status 05/13/2016 FINAL  Final  MRSA PCR Screening     Status: None   Collection Time: 05/12/16 12:24 AM  Result Value Ref Range Status   MRSA by PCR NEGATIVE NEGATIVE Final    Comment:        The GeneXpert MRSA Assay (FDA approved for NASAL specimens only), is one component of a comprehensive MRSA  colonization surveillance program. It is not intended to diagnose MRSA infection nor to guide or monitor treatment for MRSA infections.   Culture, blood (Routine X 2) w Reflex to ID Panel     Status: None   Collection Time: 05/12/16  8:45 AM  Result Value Ref Range Status   Specimen Description BLOOD RIGHT ARM  Final   Special Requests BOTTLES DRAWN AEROBIC ONLY 5CC  Final   Culture   Final    NO GROWTH 5 DAYS Performed at [facility], [address] [place], [place] [address]    Report Status 05/17/2016 FINAL  Final  Culture, blood (Routine X 2) w Reflex to ID Panel     Status: None   Collection Time: 05/12/16  8:46 AM  Result Value Ref Range Status   Specimen Description BLOOD RIGHT HAND  Final   Special Requests IN PEDIATRIC BOTTLE 1.5CC  Final   Culture   Final    NO GROWTH 5 DAYS Performed at [facility], [address] [place], [place] [address]    Report Status 05/17/2016 FINAL  Final  Culture, Urine     Status: None   Collection Time: 05/13/16 10:20 PM  Result Value Ref Range Status   Specimen Description URINE, RANDOM  Final   Special Requests NONE  Final   Culture   Final    NO GROWTH Performed at [facility], [address], [place], [place] [address]    Report Status 05/15/2016 FINAL  Final  Culture, Urine     Status: None   Collection Time: 05/14/16 10:00 AM  Result Value Ref Range Status   Specimen Description URINE, CATHETERIZED  Final   Special Requests NONE  Final   Culture   Final    NO GROWTH Performed at [facility], [address], [place], [place] [address]    Report Status 05/15/2016 FINAL  Final     Labs: Basic Metabolic Panel:  Recent Labs Lab 05/14/16 1827 05/15/16 0455 05/15/16 1712  05/16/16 1717 05/17/16 0354 05/18/16 0532 05/19/16 0400 05/20/16 0329  NA  --  144  --   < > 144 145  141 142 144  K  --  4.3  --   < > 3.8 3.7 4.1 3.9 3.8  CL  --  114*  --   < > 110 112* 105 103 106  CO2  --  27  --    < > 28 29 33* 34* 32  GLUCOSE  --  115*  --   < > 169* 124* 138* 112* 120*  BUN  --  21*  --   < > 19 16 13 14 15   CREATININE  --  1.03*  --   < > 0.89 0.75 0.72 0.78 0.72  CALCIUM  --  8.5*  --   < > 9.2 9.2 9.4 9.2 8.8*  MG 1.9 2.0 1.9  --  1.9 2.1 1.9  --   --   PHOS 1.4* 1.6* 2.7  --   --  2.6  --   --   --   < > = values in this interval not displayed. Liver Function Tests:  Recent Labs Lab 05/15/16 0455  AST 57*  ALT 16  ALKPHOS 56  BILITOT 1.4*  PROT 5.3*  ALBUMIN 3.0*    Recent Labs Lab 05/15/16 0455  AMMONIA 37*   CBC:  Recent Labs Lab 05/16/16 0402 05/17/16 0354 05/18/16 0532 05/19/16 0400 05/20/16 0329  WBC 7.6 10.1 10.6* 10.6* 9.8  HGB 8.8* 9.4* 9.5* 9.9* 9.2*  HCT 27.6* 28.9* 29.5* 30.4* 28.3*  MCV 100.4* 100.0 98.7 99.0 100.4*  PLT 76* 83* 95* 97* 107*   Cardiac Enzymes:  Recent Labs Lab 05/16/16 1717 05/17/16 0030 05/17/16 0354  TROPONINI 0.20* 0.19* 0.18*   BNP: BNP (last 3 results)  Recent Labs  12/16/15 1544  BNP 751.9*    CBG:  Recent Labs Lab 05/20/16 1535 05/20/16 2149 05/21/16 0025 05/21/16 0417 05/21/16 0747  GLUCAP 125* 118* 134* 116* 102*    Signed:  Vassie Loll MD.  [facility] 05/21/2016, 2:02 PM

## 2016-05-21 NOTE — Care Management Important Message (Signed)
Important Message  Patient Details  Name: Roderic PalauVirginia W Stratton MRN: [id number] Date of Birth: August 20, 1945   Medicare Important Message Given:  Yes    Caren MacadamFuller, Shandrea Lusk 05/21/2016, 11:28 AMImportant Message  Patient Details  Name: Roderic PalauVirginia W Maltese MRN: [id number] Date of Birth: August 20, 1945   Medicare Important Message Given:  Yes    Caren MacadamFuller, Jeffre Enriques 05/21/2016, 11:28 AM

## 2016-05-21 NOTE — Progress Notes (Signed)
Physical Therapy Treatment Patient Details Name: Gabrielle PalauVirginia W [name] MRN: [id number] DOB: 1945-08-09 Today's Date: 05/21/2016    History of Present Illness 71 y.o.femalewith history of CAD status post CABG, diastolic CHF, hypertension, atrial fibrillation not on anticoagulation secondary to severe GI bleed, tachybradycardia syndrome status post pacemaker placement was brought to the ERsecondary to confusion. Dx of encephalopathy, UTI,  hypotension, respiratory failure.    PT Comments    Pt motivated and progressing well with activity tolerance, mobility and stability.  Pt states possible dc to SNF this pm   Follow Up Recommendations  SNF;Supervision/Assistance - 24 hour     Equipment Recommendations  None recommended by PT    Recommendations for Other Services OT consult     Precautions / Restrictions Precautions Precautions: Fall Precaution Comments: son reports pt has h/o several falls Restrictions Weight Bearing Restrictions: No    Mobility  Bed Mobility Overal bed mobility: Needs Assistance Bed Mobility: Supine to Sit     Supine to sit: Min assist     General bed mobility comments: HOB elevated to 45  Transfers Overall transfer level: Needs assistance Equipment used: Rolling walker (2 wheeled) Transfers: Sit to/from Stand Sit to Stand: Min assist         General transfer comment: min A to rise, VCs hand placement,   Ambulation/Gait Ambulation/Gait assistance: Min assist Ambulation Distance (Feet): 140 Feet (twice) Assistive device: Rolling walker (2 wheeled) Gait Pattern/deviations: Step-through pattern;Decreased step length - right;Decreased step length - left;Shuffle;Trunk flexed Gait velocity: decr Gait velocity interpretation: Below normal speed for age/gender General Gait Details: cues for posture and position from [employer]   Stairs            Wheelchair Mobility    Modified Rankin (Stroke Patients Only)       Balance Overall balance  assessment: Needs assistance Sitting-balance support: Feet supported Sitting balance-Leahy Scale: Good     Standing balance support: Bilateral upper extremity supported Standing balance-Leahy Scale: Poor                      Cognition Arousal/Alertness: Awake/alert Behavior During Therapy: WFL for tasks assessed/performed Overall Cognitive Status: Within Functional Limits for tasks assessed                      Exercises      General Comments        Pertinent Vitals/Pain Pain Assessment: Faces Faces Pain Scale: Hurts a little bit Pain Location: back pain Pain Descriptors / Indicators: Aching Pain Intervention(s): Limited activity within patient's tolerance;Monitored during session    Home Living                      Prior Function            PT Goals (current goals can now be found in the care plan section) Acute Rehab PT Goals Patient Stated Goal: get home to her dog PT Goal Formulation: With patient/family Time For Goal Achievement: 06/01/16 Potential to Achieve Goals: Good Progress towards PT goals: Progressing toward goals    Frequency    Min 3X/week      PT Plan Current plan remains appropriate    Co-evaluation             End of Session Equipment Utilized During Treatment: Gait belt Activity Tolerance: Patient tolerated treatment well Patient left: Other (comment) (bathroom) Nurse Communication: Mobility status PT Visit Diagnosis: History of falling (Z91.81);Other abnormalities of gait  and mobility (R26.89)     Time: 1140-1200 PT Time Calculation (min) (ACUTE ONLY): 20 min  Charges:  $Gait Training: 8-22 mins                    G Codes:       Olon Russ May 27, 2016, 12:17 PM

## 2016-05-21 NOTE — Progress Notes (Signed)
Progress Note  Patient Name: Gabrielle PalauVirginia W Marland Date of Encounter: 05/21/2016  Primary Cardiologist: Anne FuSkains  Subjective   Feeling well this morning. Wanting to get up and walk.   Inpatient Medications    Scheduled Meds: . aspirin  81 mg Per Tube Daily  . budesonide (PULMICORT) nebulizer solution  0.5 mg Nebulization BID  . chlorhexidine  15 mL Mouth Rinse BID  . clobetasol cream  1 application Topical BID  . feeding supplement (ENSURE ENLIVE)  237 mL Oral Q24H  . fluticasone  2 spray Each Nare Daily  . heparin subcutaneous  5,000 Units Subcutaneous Q8H  . ipratropium  0.5 mg Nebulization BID  . levalbuterol  0.63 mg Nebulization BID  . mouth rinse  15 mL Mouth Rinse BID  . metoprolol tartrate  25 mg Oral BID  . QUEtiapine  25 mg Per Tube QHS  . thiamine  100 mg Per Tube Daily  . Vitamin D (Ergocalciferol)  50,000 Units Oral Q7 days   Continuous Infusions: . dextrose 5 % and 0.9% NaCl 10 mL/hr at 05/21/16 1017   PRN Meds: acetaminophen **OR** acetaminophen, HYDROcodone-acetaminophen, levalbuterol, LORazepam, ondansetron (ZOFRAN) IV   Vital Signs    Vitals:   05/21/16 0420 05/21/16 0754 05/21/16 1007 05/21/16 1050  BP:   (!) 115/50   Pulse:   68   Resp:      Temp:      TempSrc:      SpO2: 92% 95%  96%  Weight:      Height:        Intake/Output Summary (Last 24 hours) at 05/21/16 1116 Last data filed at 05/21/16 0900  Gross per 24 hour  Intake           885.83 ml  Output              200 ml  Net           685.83 ml   Filed Weights   05/19/16 0438 05/20/16 0500 05/21/16 0415  Weight: 177 lb 11.1 oz (80.6 kg) 171 lb 8.3 oz (77.8 kg) 178 lb 9.2 oz (81 kg)    Telemetry    SR with fewer episodes of SVT - Personally Reviewed  ECG    N/A - Personally Reviewed  Physical Exam   General: Well developed, well nourished, female appearing in no acute distress. Head: Normocephalic, atraumatic.  Neck: Supple without bruits, JVD. Lungs:  Resp regular and  unlabored, CTA. Heart: RRR, S1, S2, no S3, S4, or murmur; no rub. Abdomen: Soft, non-tender, non-distended with normoactive bowel sounds. No hepatomegaly. No rebound/guarding. No obvious abdominal masses. Extremities: No clubbing, cyanosis, edema. Distal pedal pulses are 2+ bilaterally. Psoriasis to upper and lower extremities. Neuro: Alert and oriented X 3. Moves all extremities spontaneously. Psych: Normal affect.  Labs    Chemistry Recent Labs Lab 05/15/16 0455  05/18/16 0532 05/19/16 0400 05/20/16 0329  NA 144  < > 141 142 144  K 4.3  < > 4.1 3.9 3.8  CL 114*  < > 105 103 106  CO2 27  < > 33* 34* 32  GLUCOSE 115*  < > 138* 112* 120*  BUN 21*  < > 13 14 15   CREATININE 1.03*  < > 0.72 0.78 0.72  CALCIUM 8.5*  < > 9.4 9.2 8.8*  PROT 5.3*  --   --   --   --   ALBUMIN 3.0*  --   --   --   --  AST 57*  --   --   --   --   ALT 16  --   --   --   --   ALKPHOS 56  --   --   --   --   BILITOT 1.4*  --   --   --   --   GFRNONAA 53*  < > >60 >60 >60  GFRAA >60  < > >60 >60 >60  ANIONGAP 3*  < > 3* 5 6  < > = values in this interval not displayed.   Hematology Recent Labs Lab 05/18/16 0532 05/19/16 0400 05/20/16 0329  WBC 10.6* 10.6* 9.8  RBC 2.99* 3.07* 2.82*  HGB 9.5* 9.9* 9.2*  HCT 29.5* 30.4* 28.3*  MCV 98.7 99.0 100.4*  MCH 31.8 32.2 32.6  MCHC 32.2 32.6 32.5  RDW 16.3* 16.4* 16.7*  PLT 95* 97* 107*    Cardiac Enzymes Recent Labs Lab 05/16/16 1717 05/17/16 0030 05/17/16 0354  TROPONINI 0.20* 0.19* 0.18*   No results for input(s): TROPIPOC in the last 168 hours.   BNPNo results for input(s): BNP, PROBNP in the last 168 hours.   DDimer No results for input(s): DDIMER in the last 168 hours.    Radiology    No results found.  Cardiac Studies   TTE: 05/17/16  Study Conclusions  - Left ventricle: The cavity size was mildly dilated. Systolic function was normal. The estimated ejection fraction was 55%. There is akinesis of the apical  myocardium. There is akinesis of the basalinferior myocardium. There is akinesis of the apicalanterior myocardium. There was a reduced contribution of atrial contraction to ventricular filling, due to increased ventricular diastolic pressure or atrial contractile dysfunction. Doppler parameters are consistent with a reversible restrictive pattern, indicative of decreased left ventricular diastolic compliance and/or increased left atrial pressure (grade 3 diastolic dysfunction). Doppler parameters are consistent with high ventricular filling pressure. - Aortic valve: There was mild regurgitation. - Mitral valve: Severely calcified annulus. There was moderate regurgitation directed posteriorly. Mean gradient (D): 7 mm Hg. - Left atrium: The atrium was mildly dilated. - Right ventricle: Pacer wire or catheter noted in right ventricle. - Right atrium: The atrium was mildly dilated. - Tricuspid valve: There was moderate regurgitation. - Pulmonic valve: There was mild regurgitation. - Pulmonary arteries: PA peak pressure: 59 mm Hg (S).  Impressions:  - The right ventricular systolic pressure was increased consistent with moderate pulmonary hypertension.  Patient Profile     71 y.o. female female history of CAD, s/p CABG x 7 in 2010, PAF with SSS, s/p BS PTVDP 12/26/15, prior GI bleeding- CHADs VASc=4 but not anticoagulated, HTN, and HLD. She was brought to the ED at [facility] 05/11/16 by the police after the family called concerned about increasing confusion and paranoid behavior. We were asked to see for tachycardia. The pt was completely uncooperative and combative and required IV Precedex for sedation so we could control her HR. Now awake and alert  Assessment & Plan    1. AF RVR: Remains in SR, fewer episodes of SVT noted on telemetry. Currently on metoprolol 50mg  BID. -- had a GID bleed in 2017, but was cleared to resume Eliquis. Reports she never picked up her new Rx  prior to this admission. Concern for intermittent episodes of AMS. ChadsVasc 4, Hgb stable around 9, and platelets improving. Consider OAC in the future.   2. Altered Mental Status: Now resolved.   3. CAD: S/P CABG in 2010. Echo this admission  showed her EF to be 55% grade 3 DD, moderate pulmonary HTN  Signed, Laverda Page, NP  05/21/2016, 11:16 AM    I have examined the patient and reviewed assessment and plan and discussed with patient.  Agree with above as stated.  Rhythm stable.  Continue current dose of metoprolol.  WOuld not start anticoagualtion at this time.  Mental status better at this time.  WOuld ensure that will not be an issue going forward before restarting Eliquis.   Lance Muss

## 2016-05-21 NOTE — Care Management Note (Signed)
Case Management Note  Patient Details  Name: Roderic PalauVirginia W Boys MRN: [id number] Date of Birth: May 09, 1945  Subjective/Objective:Transfer from SDU. Acute encephalopathy. PT/OT-recc SNF. CSW following for SNF-[facility].                    Action/Plan:d/c plan SNF.   Expected Discharge Date:   (unknown)               Expected Discharge Plan:  Skilled Nursing Facility  In-House Referral:  Clinical Social Work  Discharge planning Services  CM Consult  Post Acute Care Choice:    Choice offered to:     DME Arranged:    DME Agency:     HH Arranged:    HH Agency:     Status of Service:  In process, will continue to follow  If discussed at Long Length of Stay Meetings, dates discussed:    Additional Comments:  Lanier ClamMahabir, Shearon Clonch, RN 05/21/2016, 11:24 AM

## 2016-05-25 ENCOUNTER — Non-Acute Institutional Stay (SKILLED_NURSING_FACILITY): Payer: Medicare Other | Admitting: Internal Medicine

## 2016-05-25 ENCOUNTER — Encounter: Payer: Self-pay | Admitting: Internal Medicine

## 2016-05-25 ENCOUNTER — Ambulatory Visit: Payer: Medicare Other | Admitting: Cardiology

## 2016-05-25 DIAGNOSIS — J439 Emphysema, unspecified: Secondary | ICD-10-CM

## 2016-05-25 DIAGNOSIS — K5909 Other constipation: Secondary | ICD-10-CM | POA: Diagnosis not present

## 2016-05-25 DIAGNOSIS — L409 Psoriasis, unspecified: Secondary | ICD-10-CM

## 2016-05-25 DIAGNOSIS — Z8719 Personal history of other diseases of the digestive system: Secondary | ICD-10-CM | POA: Diagnosis not present

## 2016-05-25 DIAGNOSIS — M545 Low back pain, unspecified: Secondary | ICD-10-CM

## 2016-05-25 DIAGNOSIS — D638 Anemia in other chronic diseases classified elsewhere: Secondary | ICD-10-CM

## 2016-05-25 DIAGNOSIS — D696 Thrombocytopenia, unspecified: Secondary | ICD-10-CM | POA: Diagnosis not present

## 2016-05-25 DIAGNOSIS — R410 Disorientation, unspecified: Secondary | ICD-10-CM | POA: Diagnosis not present

## 2016-05-25 DIAGNOSIS — G8929 Other chronic pain: Secondary | ICD-10-CM

## 2016-05-25 DIAGNOSIS — I5032 Chronic diastolic (congestive) heart failure: Secondary | ICD-10-CM

## 2016-05-25 DIAGNOSIS — R531 Weakness: Secondary | ICD-10-CM | POA: Diagnosis not present

## 2016-05-25 DIAGNOSIS — I4891 Unspecified atrial fibrillation: Secondary | ICD-10-CM

## 2016-05-25 DIAGNOSIS — K7460 Unspecified cirrhosis of liver: Secondary | ICD-10-CM | POA: Diagnosis not present

## 2016-05-25 NOTE — Progress Notes (Signed)
LOCATION: [facility]  PCP: Irving Copas, MD   Code Status: Full Code  Goals of care: Advanced Directive information Advanced Directives 05/12/2016  Does Patient Have a Medical Advance Directive? No  Would patient like information on creating a medical advance directive? No - Patient declined       Extended Emergency Contact Information Primary Emergency Contact: Wise,[name] Address: [address]          [place], [place] [address] [place] Mobile Phone: [phone] Relation: Sister Secondary Emergency Contact: Bhalla,Bruce Address: [address]          [place], [place] [address] [place] Phone: [phone] Relation: Son   Allergies  Allergen Reactions  . Novocain [Procaine] Hives and Palpitations  . Penicillins Hives and Other (See Comments)    Has patient had a PCN reaction causing immediate rash, facial/tongue/throat swelling, SOB or lightheadedness with hypotension: No Has patient had a PCN reaction causing severe rash involving mucus membranes or skin necrosis: No Has patient had a PCN reaction that required hospitalization No Has patient had a PCN reaction occurring within the last 10 years: No If all of the above answers are "NO", then may proceed with Cephalosporin use.    Chief Complaint  Patient presents with  . New Admit To SNF    New Admission Visit      HPI:  Patient is a 71 y.o. female seen today for short term rehabilitation post hospital admission from 05/11/2016-05/21/2016 with acute encephalopathy. CT head was negative for acute intracranial abnormality. Her urine study was suggestive of infection and she was started on antibiotic for UTI. Given her anxiety and agitation, she required Precedex drip and Seroquel. With mildly elevated ammonia level, she was started on lactulose. She also was hypotensive and required pressor support along with IV fluids. Following this she had acute hypoxic respiratory  failure from pulmonary vascular congestion and required IV diuretic. Given her deterioration liver enzymes and elevated ammonia level, she underwent laparoscopic ultrasound suggestive of cirrhosis. She made some clinical improvement and given her deconditioning, short term rehabilitation was recommended.She has medical history of essential hypertension, atrial fibrillation, protein calorie malnutrition, GI bleed, dementia, chronic diastolic congestive heart failure among others. She is seen in her room today.    Review of Systems:  Constitutional: Negative for fever, chills, diaphoresis. She mentions having fair energy level this morning. HENT: Negative for headache, congestion,sore throat, difficulty swallowing. Positive for clear nasal discharge and occasional cough with white phlegm.  Eyes: Negative for blurred vision, double vision and discharge.  Respiratory: Negative for shortness of breath and wheezing.  Cardiovascular: Negative for chest pain, palpitations  Gastrointestinal: Negative for heartburn, nausea, vomiting, abdominal pain, loss of appetite. Last bowel movement was 2 days ago. Genitourinary: Negative for dysuria and flank pain.  Musculoskeletal: Negative for fall. Positive for back pain radiating to abdomen. Denies any radiation of back pain to her legs. Denies any numbness or tingling. Skin: Negative for itching, rash.  Neurological: Negative for dizziness. Psychiatric/Behavioral: Negative for depression.   Past Medical History:  Diagnosis Date  . Anemia   . Arthritis   . Asthma   . Atrial fibrillation with controlled ventricular response (HCC) 11/2009   a. not on anticoagulation due to recurrent GI bleeding while on Eliquis.  . Chronic bronchitis   . Chronic diastolic CHF (congestive heart failure) (HCC)    a. Echo 12/2015: EF 55-60% w/ Grade 3 DD  . Chronic headache   .  COPD (chronic obstructive pulmonary disease) (HCC)   . Depression   . Heart attack   .  Hyperlipidemia   . Hypertension    Past Surgical History:  Procedure Laterality Date  . CAD-CABG     x7, brief post-op Atrial Fib  . CESAREAN SECTION     x2  . COLONOSCOPY WITH PROPOFOL N/A 10/24/2015   Procedure: COLONOSCOPY WITH PROPOFOL;  Surgeon: Carman Ching, MD;  Location: [facility] ENDOSCOPY;  Service: Endoscopy;  Laterality: N/A;  . CORONARY ARTERY BYPASS GRAFT  2011  . EP IMPLANTABLE DEVICE N/A 12/26/2015   Procedure: Pacemaker Implant;  Surgeon: Marinus Maw, MD;  Location: [facility] INVASIVE CV LAB;  Service: Cardiovascular;  Laterality: N/A;  . ESOPHAGOGASTRODUODENOSCOPY N/A 10/22/2015   Procedure: ESOPHAGOGASTRODUODENOSCOPY (EGD);  Surgeon: Carman Ching, MD;  Location: [facility] ENDOSCOPY;  Service: Endoscopy;  Laterality: N/A;  . ESOPHAGOGASTRODUODENOSCOPY N/A 12/24/2015   Procedure: ESOPHAGOGASTRODUODENOSCOPY (EGD);  Surgeon: Carman Ching, MD;  Location: [facility] ENDOSCOPY;  Service: Endoscopy;  Laterality: N/A;  . JOINT REPLACEMENT Bilateral   . TONSILLECTOMY    . TOTAL KNEE ARTHROPLASTY     Social History:   reports that she has quit smoking. Her smoking use included Cigarettes. She smoked 0.20 packs per day. She has never used smokeless tobacco. She reports that she does not drink alcohol or use drugs.  Family History  Problem Relation Age of Onset  . Diabetes Mother   . Hypertension Mother   . Heart disease Mother     before age 46  . Heart disease Father     before age 13  . Hypertension Father   . Heart attack Father   . Lung cancer Maternal Grandfather   . Lung cancer Paternal Grandfather   . Hypertension Daughter   . Stroke Paternal Grandmother     Medications: Allergies as of 05/25/2016      Reactions   Novocain [procaine] Hives, Palpitations   Penicillins Hives, Other (See Comments)   Has patient had a PCN reaction causing immediate rash, facial/tongue/throat swelling, SOB or lightheadedness with hypotension: No Has patient had a PCN reaction causing severe rash  involving mucus membranes or skin necrosis: No Has patient had a PCN reaction that required hospitalization No Has patient had a PCN reaction occurring within the last 10 years: No If all of the above answers are "NO", then may proceed with Cephalosporin use.      Medication List       Accurate as of 05/25/16 11:15 AM. Always use your most recent med list.          acetaminophen 325 MG tablet Commonly known as:  TYLENOL Take 2 tablets (650 mg total) by mouth every 6 (six) hours as needed for mild pain (or Fever >/= 101).   albuterol 108 (90 Base) MCG/ACT inhaler Commonly known as:  PROVENTIL HFA;VENTOLIN HFA Inhale 2 puffs into the lungs every 6 (six) hours as needed for wheezing or shortness of breath.   ALPRAZolam 0.5 MG tablet Commonly known as:  XANAX Take 1 tablet (0.5 mg total) by mouth every 12 (twelve) hours as needed (severe anxiety).   aspirin 81 MG chewable tablet Chew 81 mg by mouth daily.   atorvastatin 20 MG tablet Commonly known as:  LIPITOR Take 20 mg by mouth daily.   budesonide-formoterol 160-4.5 MCG/ACT inhaler Commonly known as:  SYMBICORT Inhale 2 puffs into the lungs 2 (two) times daily.   clobetasol cream 0.05 % Commonly known as:  TEMOVATE Apply 1 application topically 2 (  two) times daily. Affected skin   docusate sodium 100 MG capsule Commonly known as:  COLACE Take 100 mg by mouth daily as needed for moderate constipation.   fluticasone 50 MCG/ACT nasal spray Commonly known as:  FLONASE Place 2 sprays into both nostrils daily.   furosemide 20 MG tablet Commonly known as:  LASIX Take 1 tablet (20 mg total) by mouth daily. Patient may take on additional one as needed for swelling   losartan 50 MG tablet Commonly known as:  COZAAR Take 0.5 tablets (25 mg total) by mouth daily.   metoprolol tartrate 25 MG tablet Commonly known as:  LOPRESSOR Take 1 tablet (25 mg total) by mouth 2 (two) times daily.   pantoprazole 40 MG tablet Commonly  known as:  PROTONIX Take 40 mg by mouth daily before breakfast.   polyethylene glycol packet Commonly known as:  MIRALAX / GLYCOLAX Take 17 g by mouth daily as needed for mild constipation.   QUEtiapine 25 MG tablet Commonly known as:  SEROQUEL Take 25 mg by mouth at bedtime.   ranitidine 150 MG tablet Commonly known as:  ZANTAC Take 1 tablet (150 mg total) by mouth at bedtime.   SYSTANE ULTRA 0.4-0.3 % Soln Generic drug:  Polyethyl Glycol-Propyl Glycol Place 1 drop into both eyes daily as needed (for dry eyes).   traMADol 50 MG tablet Commonly known as:  ULTRAM Take 1 tablet (50 mg total) by mouth every 8 (eight) hours as needed for severe pain.   Vitamin D (Ergocalciferol) 50000 units Caps capsule Commonly known as:  DRISDOL Take 1 capsule (50,000 Units total) by mouth every 7 (seven) days. Start taking on:  05/26/2016       Immunizations: Immunization History  Administered Date(s) Administered  . Influenza Split 01/01/2012  . Influenza Whole 11/30/2008  . Influenza,inj,Quad PF,36+ Mos 01/19/2013  . Pneumococcal Polysaccharide-23 11/30/2008     Physical Exam: Vitals:   05/25/16 1108  BP: (!) 145/85  Pulse: 71  Resp: 18  Temp: 97.6 F (36.4 C)  TempSrc: Oral  Weight: 177 lb (80.3 kg)  Height: 5\' 3"  (1.6 m)   Body mass index is 31.35 kg/m.  General- elderly female, Obese, frail, in no acute distress Head- normocephalic, atraumatic, cerumen left ear, normal right ear on otoscope exam Nose- no maxillary or frontal sinus tenderness, no nasal discharge Throat- moist mucus membrane, mild posterior oropharynx redness, edentulous  Eyes- PERRLA, EOMI, no pallor, no icterus, no discharge, normal conjunctiva, normal sclera Neck- no cervical lymphadenopathy Cardiovascular- irregular heart rate, no murmur Respiratory- bilateral clear to auscultation, no wheeze, no rhonchi, no crackles, no use of accessory muscles Abdomen- bowel sounds present, soft, non tender, no  guarding or rigidity Musculoskeletal- able to move all 4 extremities, generalized weakness, lumbar spine tenderness, 1+ pitting bilateral leg edema Neurological- alert and oriented to person, place and time Skin- warm and dry, psoriasis patches present Psychiatry- normal mood and affect    Labs reviewed: Basic Metabolic Panel:  Recent Labs  16/10/96 0455 05/15/16 1712  05/16/16 1717 05/17/16 0354 05/18/16 0532 05/19/16 0400 05/20/16 0329  NA 144  --   < > 144 145 141 142 144  K 4.3  --   < > 3.8 3.7 4.1 3.9 3.8  CL 114*  --   < > 110 112* 105 103 106  CO2 27  --   < > 28 29 33* 34* 32  GLUCOSE 115*  --   < > 169* 124* 138* 112* 120*  BUN 21*  --   < > 19 16 13 14 15   CREATININE 1.03*  --   < > 0.89 0.75 0.72 0.78 0.72  CALCIUM 8.5*  --   < > 9.2 9.2 9.4 9.2 8.8*  MG 2.0 1.9  --  1.9 2.1 1.9  --   --   PHOS 1.6* 2.7  --   --  2.6  --   --   --   < > = values in this interval not displayed. Liver Function Tests:  Recent Labs  05/12/16 0340 05/14/16 0850 05/15/16 0455  AST 42* 76* 57*  ALT 10* 16 16  ALKPHOS 56 53 56  BILITOT 2.8* 2.3* 1.4*  PROT 5.7* 5.7* 5.3*  ALBUMIN 3.4* 3.4* 3.0*   No results for input(s): LIPASE, AMYLASE in the last 8760 hours.  Recent Labs  05/12/16 0731 05/13/16 1415 05/15/16 0455  AMMONIA 31 60* 37*   CBC:  Recent Labs  12/19/15 0525 12/21/15 0533  05/11/16 2032  05/18/16 0532 05/19/16 0400 05/20/16 0329  WBC 10.7* 8.2  < > 11.5*  < > 10.6* 10.6* 9.8  NEUTROABS 6.1 5.7  --  7.2  --   --   --   --   HGB 11.5* 10.8*  < > 10.1*  < > 9.5* 9.9* 9.2*  HCT 35.9* 33.3*  < > 30.7*  < > 29.5* 30.4* 28.3*  MCV 95.7 97.1  < > 98.4  < > 98.7 99.0 100.4*  PLT 130* 118*  < > 123*  < > 95* 97* 107*  < > = values in this interval not displayed. Cardiac Enzymes:  Recent Labs  05/16/16 1717 05/17/16 0030 05/17/16 0354  TROPONINI 0.20* 0.19* 0.18*   BNP: Invalid input(s): POCBNP CBG:  Recent Labs  05/21/16 0025 05/21/16 0417  05/21/16 0747  GLUCAP 134* 116* 102*    Radiological Exams: Dg Chest 1 View  Result Date: 05/11/2016 CLINICAL DATA:  Dementia/confusion with history of fall EXAM: CHEST 1 VIEW COMPARISON:  12/27/2015 FINDINGS: Post sternotomy changes are again evident. There is a left-sided duo lead pacing device with leads over the right atrium and right ventricle. The lungs are slightly hyperinflated. There is no acute infiltrate or effusion. No pneumothorax. Stable cardiomegaly with central vascular congestion. Aortic atherosclerosis. IMPRESSION: 1. Cardiomegaly with mild central congestion.  No overt edema 2. No acute infiltrate Electronically Signed   By: Jasmine Pang M.D.   On: 05/11/2016 23:39   Dg Lumbar Spine 2-3 Views  Result Date: 05/11/2016 CLINICAL DATA:  Increased confusion with history of fall EXAM: LUMBAR SPINE - 2-3 VIEW COMPARISON:  12/16/2015 FINDINGS: Five non rib-bearing lumbar type vertebra. SI joints are patent. Lumbar alignment stable. Mild superior endplate compression deformity of L3 minimally progressed. New mild compression deformity of T12 with approximately 25% loss of height anteriorly. Remaining vertebral bodies appear grossly maintained. Mild to moderate narrowing at L2-L3 with mild narrowing at L3-L4, L4-L5 and L5-S1. Dense atherosclerosis of the aorta IMPRESSION: 1. Slight progression of previously noted superior endplate deformity at L3 2. New mild compression deformity of T12 Electronically Signed   By: Jasmine Pang M.D.   On: 05/11/2016 23:37   Dg Pelvis 1-2 Views  Result Date: 05/11/2016 CLINICAL DATA:  Increased confusion with history of fall EXAM: PELVIS - 1-2 VIEW COMPARISON:  12/16/2015 FINDINGS: There is no evidence of pelvic fracture or diastasis. No pelvic bone lesions are seen. Mild arthritis of the bilateral hips. IMPRESSION: No acute osseous abnormality Electronically  Signed   By: Jasmine Pang M.D.   On: 05/11/2016 23:37   Dg Abd 1 View  Result Date:  05/16/2016 CLINICAL DATA:  NG tube placement EXAM: ABDOMEN - 1 VIEW COMPARISON:  Abdominal radiograph 05/14/2016 FINDINGS: Weighted enteric tube tip overlies the gastric body. Nonobstructive bowel gas pattern. IMPRESSION: Weighted enteric tube tip overlying the gastric body. Electronically Signed   By: Deatra Robinson M.D.   On: 05/16/2016 00:57   Dg Abd 1 View  Result Date: 05/14/2016 CLINICAL DATA:  71 y/o  F; feeding tube placement. EXAM: ABDOMEN - 1 VIEW COMPARISON:  12/01/2010 abdomen radiographs. FINDINGS: The bowel gas pattern is normal. Kidneys largely obscured by bowel gas. Moderate volume of stool in the colon. Enteric tube tip projects over distal stomach. IMPRESSION: Normal bowel gas pattern. Moderate volume of stool in the colon. Enteric tube tip projects over distal stomach. Electronically Signed   By: Mitzi Hansen M.D.   On: 05/14/2016 19:22   Ct Head Wo Contrast  Result Date: 05/11/2016 CLINICAL DATA:  71 year old female with altered mental status. EXAM: CT HEAD WITHOUT CONTRAST TECHNIQUE: Contiguous axial images were obtained from the base of the skull through the vertex without intravenous contrast. COMPARISON:  None. FINDINGS: Brain: There is mild age-related atrophy and chronic microvascular ischemic changes. There is no acute intracranial hemorrhage. No mass effect or midline shift noted. No intra-axial fluid collection. Vascular: No hyperdense vessel or unexpected calcification. Skull: Normal. Negative for fracture or focal lesion. Sinuses/Orbits: No acute finding. Other: None. IMPRESSION: No acute intracranial hemorrhage. Mild age-related atrophy and chronic microvascular ischemic disease. If symptoms persist and there are no contraindications, MRI may provide better evaluation if clinically indicated. Electronically Signed   By: Elgie Collard M.D.   On: 05/11/2016 21:00   US Abdomen Limited  Result Date: 05/13/2016 CLINICAL DATA:  Abnormal transaminases. EXAM: US  ABDOMEN LIMITED - RIGHT UPPER QUADRANT COMPARISON:  Ultrasound 07/30/2015. FINDINGS: Gallbladder: No gallstones or wall thickening visualized. No sonographic Murphy sign noted by sonographer. Common bile duct: Diameter: 9.6 mm Liver: Liver has a echogenic echotexture with irregular contour. Findings suggest cirrhosis. No focal hepatic abnormality identified. IMPRESSION: 1. Liver has an echogenic echotexture with irregular contour. Findings suggest cirrhosis. No focal hepatic abnormality identified. 2. Dilated common bile duct at 9.6 mm. No gallstones identified. MRCP can be obtained to further evaluate Electronically Signed   By: Maisie Fus  Register   On: 05/13/2016 11:40   US Renal Port  Result Date: 05/13/2016 CLINICAL DATA:  Acute renal failure EXAM: RENAL / URINARY TRACT ULTRASOUND COMPLETE COMPARISON:  None. FINDINGS: Right Kidney: Length: 12.2 cm. Echogenicity within normal limits. No mass or hydronephrosis visualized. Left Kidney: Length: 11.0 cm. Nonobstructing calculus in the mid LEFT renal cortex measures 10 mm. No hydronephrosis Bladder: Appears normal for degree of bladder distention. IMPRESSION: 1. LEFT nephrolithiasis. 2. No renal obstruction. Electronically Signed   By: Genevive Bi M.D.   On: 05/13/2016 21:35   Dg Chest Port 1 View  Result Date: 05/17/2016 CLINICAL DATA:  Hypoxia per ordering note; pt id checked with NT EXAM: PORTABLE CHEST 1 VIEW COMPARISON:  05/15/2016 FINDINGS: LEFT-sided pacemaker overlies stable cardiac silhouette. NG tube and central venous line are unchanged. There is interval increase in bilateral small pleural effusions. No pneumothorax or pulmonary edema per IMPRESSION: 1. Stable support apparatus. 2. Increased bilateral pleural effusions. Electronically Signed   By: Genevive Bi M.D.   On: 05/17/2016 16:18   Dg Chest [facility] 1 View  Result  Date: 05/15/2016 CLINICAL DATA:  Respiratory failure. EXAM: PORTABLE CHEST 1 VIEW COMPARISON:  05/13/2016. FINDINGS:  Feeding tube noted with tip below left hemidiaphragm. Right IJ line stable position. Cardiac pacer with lead tips in right atrium right ventricle. Prior CABG. Cardiomegaly with normal pulmonary vascularity. Low lung volumes. Mild left base subsegmental atelectasis versus infiltrate. Small left pleural effusion IMPRESSION: 1. Feeding tube noted with tip below left hemidiaphragm. Right IJ line stable position. 2. Cardiac pacer stable position. Prior CABG. Stable cardiomegaly. 3. Low lung volumes. Mild left base subsegmental atelectasis. Mild infiltrate cannot be excluded. Small left pleural effusion cannot be excluded. Electronically Signed   By: Maisie Fushomas  Register   On: 05/15/2016 06:50   Dg Chest Port 1 View  Result Date: 05/13/2016 CLINICAL DATA:  Central line placement. EXAM: PORTABLE CHEST 1 VIEW COMPARISON:  05/13/2016 . FINDINGS: Right IJ line noted with tip over the superior vena cava. Mediastinum is stable. Cardiac pacer stable position. Prior CABG. Stable cardiomegaly. No focal infiltrate. No pleural effusion or pneumothorax . IMPRESSION: 1. Interim placement of right IJ line, its tip is projected over superior vena cava. No pneumothorax. 2. Cardiac pacer stable position. Prior CABG. Stable cardiomegaly. Electronically Signed   By: Maisie Fushomas  Register   On: 05/13/2016 15:19   Dg Chest Port 1 View  Result Date: 05/13/2016 CLINICAL DATA:  Hypoxia over night, episode of supraventricular tachycardia. Hypotension. EXAM: PORTABLE CHEST 1 VIEW COMPARISON:  05/11/2016. FINDINGS: Trachea is midline. Heart is enlarged. Pacemaker lead tips project over the right atrium and right ventricle. Minimal linear atelectasis or scarring in the lingula. Lungs are otherwise clear. No pleural fluid. IMPRESSION: No acute findings. Electronically Signed   By: Leanna BattlesMelinda  Blietz M.D.   On: 05/13/2016 09:33   Dg Abd Portable 1v  Result Date: 05/17/2016 CLINICAL DATA:  Nasogastric tube placement. EXAM: PORTABLE ABDOMEN - 1 VIEW  COMPARISON:  05/16/2016 FINDINGS: Enteric tube is present with tip over the stomach in the midline upper abdomen likely in distal stomach. Bowel gas pattern is nonobstructive. Remainder of the exam is unchanged. IMPRESSION: Nonobstructive bowel gas pattern. Nasogastric tube with tip over the midline upper abdomen likely in the distal stomach. Electronically Signed   By: Elberta Fortisaniel  Boyle M.D.   On: 05/17/2016 08:06   Dg Abd Portable 1v  Result Date: 05/16/2016 CLINICAL DATA:  Nasogastric tube placement. EXAM: PORTABLE ABDOMEN - 1 VIEW COMPARISON:  Earlier today. FINDINGS: Feeding tube tip in the proximal stomach. No nasogastric tube is seen. Stable cardiac pacemaker leads. No dilated bowel loops. Lumbar spine degenerative changes. IMPRESSION: Feeding tube tip in the proximal stomach.  No nasogastric tube seen. Electronically Signed   By: Beckie SaltsSteven  Reid M.D.   On: 05/16/2016 14:12    Assessment/Plan  Generalized weakness From physical deconditioning. Will have her work with physical therapy and occupational therapy team to help with gait training and muscle strengthening exercises.fall precautions. Skin care. Encourage to be out of bed.   Delirium Thought to be from acute encephalopathy. Currently on Seroquel 25 mg daily at bedtime. Patient appears alert and oriented this visit. Decrease this to 12.5 mg daily for now and monitor. Consider taking her off it in 2 weeks if has no acute episode of delirium.   History of GI bleed No bleed reported at present. Currently on pantoprazole 40 mg daily and ranitidine 150 mg daily. Monitor clinically.  Chronic diastolic CHF Continue metoprolol tartrate 25 mg twice a day, losartan 25 mg daily and Lasix 20 mg daily and additional  20 mg daily as needed. Monitor her weight 3 days a week and if has more than 3 pound weight gain or increased leg edema, to provide Lasix.  Atrial fibrillation Continue metoprolol tartrate 25 mg twice a day. Continue aspirin 81 mg daily  with change this to enteric-coated. Not on anticoagulation because of history of GI bleed. Continue Lipitor  Thrombocytopenia Has liver cirrhosis, no bleed reported. Monitor platelet count  Anemia of chronic disease Check cbc periodically  Liver cirrhosis Noted on ultrasound. Monitor cbc and cmp, avoid hepatotoxic drugs. D/c tylenol. Monitor mental state. Make GI follow up  Psoriasis apply clobetasol cream 0.05% twice a day to affected areas and monitor. On med review, patient is also on clobetasol 0.025% cream. Discontinue this.  Chronic constipation Currently on MiraLAX daily as needed and Colace daily as needed. Monitor clinically. Encouraged hydration.  Chronic back pain Continue tramadol 50 mg every 8 hours as needed for pain. She is also on acetaminophen 650 mg every 6 hours as needed but given her deranged LFT, will discontinue Tylenol for now. PMR consult.  COPD Breathing currently stable, continue proventil every 6 hr as needed and  symbicort bid   Goals of care: short term rehabilitation   Labs/tests ordered: CBC, CMP 05/26/16  Family/ staff Communication: reviewed care plan with patient and nursing supervisor   I have spent greater than 50 minutes for this encounter which includes reviewing hospital records, addressing above mentioned concerns, formulating and reviewing care plan with patient, answering patient's concerns and counseling her.    Oneal Grout, MD Internal Medicine [facility] [address] [place], [place] [address] Cell Phone (Monday-Friday 8 am - 5 pm): [phone] On Call: [phone] and follow prompts after 5 pm and on weekends Office Phone: [phone] Office Fax: [phone]

## 2016-05-26 LAB — HEPATIC FUNCTION PANEL
ALT: 10 U/L (ref 7–35)
AST: 29 U/L (ref 13–35)
Alkaline Phosphatase: 128 U/L — AB (ref 25–125)
Bilirubin, Total: 0.8 mg/dL

## 2016-05-26 LAB — CBC AND DIFFERENTIAL
HCT: 28 % — AB (ref 36–46)
Hemoglobin: 8.5 g/dL — AB (ref 12.0–16.0)
Platelets: 139 10*3/uL — AB (ref 150–399)
WBC: 9.2 10^3/mL

## 2016-05-26 LAB — BASIC METABOLIC PANEL
BUN: 16 mg/dL (ref 4–21)
Creatinine: 1.1 mg/dL (ref 0.5–1.1)
Glucose: 123 mg/dL
Potassium: 4.6 mmol/L (ref 3.4–5.3)
Sodium: 144 mmol/L (ref 137–147)

## 2016-05-28 ENCOUNTER — Encounter: Payer: Self-pay | Admitting: Internal Medicine

## 2016-05-28 ENCOUNTER — Non-Acute Institutional Stay (SKILLED_NURSING_FACILITY): Payer: Medicare Other | Admitting: Internal Medicine

## 2016-05-28 DIAGNOSIS — Z8719 Personal history of other diseases of the digestive system: Secondary | ICD-10-CM | POA: Diagnosis not present

## 2016-05-28 DIAGNOSIS — D649 Anemia, unspecified: Secondary | ICD-10-CM

## 2016-05-28 DIAGNOSIS — D696 Thrombocytopenia, unspecified: Secondary | ICD-10-CM | POA: Diagnosis not present

## 2016-05-28 NOTE — Progress Notes (Signed)
LOCATION: [facility]  PCP: Irving Copas, MD   Code Status: Full Code  Goals of care: Advanced Directive information Advanced Directives 05/12/2016  Does Patient Have a Medical Advance Directive? No  Would patient like information on creating a medical advance directive? No - Patient declined       Extended Emergency Contact Information Primary Emergency Contact: Wise,[name] Address: [address]          [place], [place] [address] [place] Mobile Phone: [phone] Relation: Sister Secondary Emergency Contact: Thresher,Bruce Address: [address]          [place], [place] [address] [place] Phone: [phone] Relation: Son   Allergies  Allergen Reactions  . Novocain [Procaine] Hives and Palpitations  . Penicillins Hives and Other (See Comments)    Has patient had a PCN reaction causing immediate rash, facial/tongue/throat swelling, SOB or lightheadedness with hypotension: No Has patient had a PCN reaction causing severe rash involving mucus membranes or skin necrosis: No Has patient had a PCN reaction that required hospitalization No Has patient had a PCN reaction occurring within the last 10 years: No If all of the above answers are "NO", then may proceed with Cephalosporin use.    Chief Complaint  Patient presents with  . Acute Visit    Drop in hemoglobin     HPI:  Patient is a 71 y.o. female seen today for acute visit. She is here for rehabilitation post hospital admission with acute encephalopathy from UTI and liver cirrhosis. She had lab work showing drop in her Hb level. She has history of GI bleed on review. She is seen in her room today.   Review of Systems:  Constitutional: Negative for fever, chills. Energy level is poor.  HENT: Negative for headache, congestion Eyes: Negative for blurred vision, double vision and discharge.  Respiratory: Negative for shortness of breath and wheezing.  Cardiovascular:  Negative for chest pain, palpitations  Gastrointestinal: Negative for heartburn, nausea, vomiting, abdominal pain, loss of appetite. She had a bowel movement yesterday. Denies any blood in stool. Negative for melena. Genitourinary: Negative for dysuria and hematuria  Musculoskeletal: Negative for fall. Positive for back pain   Neurological: Negative for dizziness.   Past Medical History:  Diagnosis Date  . Anemia   . Arthritis   . Asthma   . Atrial fibrillation with controlled ventricular response (HCC) 11/2009   a. not on anticoagulation due to recurrent GI bleeding while on Eliquis.  . Chronic bronchitis   . Chronic diastolic CHF (congestive heart failure) (HCC)    a. Echo 12/2015: EF 55-60% w/ Grade 3 DD  . Chronic headache   . COPD (chronic obstructive pulmonary disease) (HCC)   . Depression   . Heart attack   . Hyperlipidemia   . Hypertension    Past Surgical History:  Procedure Laterality Date  . CAD-CABG     x7, brief post-op Atrial Fib  . CESAREAN SECTION     x2  . COLONOSCOPY WITH PROPOFOL N/A 10/24/2015   Procedure: COLONOSCOPY WITH PROPOFOL;  Surgeon: Carman Ching, MD;  Location: [facility] ENDOSCOPY;  Service: Endoscopy;  Laterality: N/A;  . CORONARY ARTERY BYPASS GRAFT  2011  . EP IMPLANTABLE DEVICE N/A 12/26/2015   Procedure: Pacemaker Implant;  Surgeon: Marinus Maw, MD;  Location: [facility] INVASIVE CV LAB;  Service: Cardiovascular;  Laterality: N/A;  . ESOPHAGOGASTRODUODENOSCOPY N/A 10/22/2015   Procedure: ESOPHAGOGASTRODUODENOSCOPY (EGD);  Surgeon: Carman Ching, MD;  Location: [facility] ENDOSCOPY;  Service: Endoscopy;  Laterality: N/A;  . ESOPHAGOGASTRODUODENOSCOPY N/A 12/24/2015   Procedure: ESOPHAGOGASTRODUODENOSCOPY (EGD);  Surgeon: Carman Ching, MD;  Location: [facility] ENDOSCOPY;  Service: Endoscopy;  Laterality: N/A;  . JOINT REPLACEMENT Bilateral   . TONSILLECTOMY    . TOTAL KNEE ARTHROPLASTY     Social History:   reports that she has quit smoking. Her smoking use included  Cigarettes. She smoked 0.20 packs per day. She has never used smokeless tobacco. She reports that she does not drink alcohol or use drugs.  Family History  Problem Relation Age of Onset  . Diabetes Mother   . Hypertension Mother   . Heart disease Mother     before age 18  . Heart disease Father     before age 66  . Hypertension Father   . Heart attack Father   . Lung cancer Maternal Grandfather   . Lung cancer Paternal Grandfather   . Hypertension Daughter   . Stroke Paternal Grandmother     Medications: Allergies as of 05/28/2016      Reactions   Novocain [procaine] Hives, Palpitations   Penicillins Hives, Other (See Comments)   Has patient had a PCN reaction causing immediate rash, facial/tongue/throat swelling, SOB or lightheadedness with hypotension: No Has patient had a PCN reaction causing severe rash involving mucus membranes or skin necrosis: No Has patient had a PCN reaction that required hospitalization No Has patient had a PCN reaction occurring within the last 10 years: No If all of the above answers are "NO", then may proceed with Cephalosporin use.      Medication List       Accurate as of 05/28/16 12:48 PM. Always use your most recent med list.          albuterol 108 (90 Base) MCG/ACT inhaler Commonly known as:  PROVENTIL HFA;VENTOLIN HFA Inhale 2 puffs into the lungs every 6 (six) hours as needed for wheezing or shortness of breath.   ALPRAZolam 0.5 MG tablet Commonly known as:  XANAX Take 1 tablet (0.5 mg total) by mouth every 12 (twelve) hours as needed (severe anxiety).   aspirin 81 MG chewable tablet Chew 81 mg by mouth daily.   atorvastatin 20 MG tablet Commonly known as:  LIPITOR Take 20 mg by mouth daily.   budesonide-formoterol 160-4.5 MCG/ACT inhaler Commonly known as:  SYMBICORT Inhale 2 puffs into the lungs 2 (two) times daily.   clobetasol cream 0.05 % Commonly known as:  TEMOVATE Apply 1 application topically 2 (two) times daily.  Affected skin   docusate sodium 100 MG capsule Commonly known as:  COLACE Take 100 mg by mouth daily as needed for moderate constipation.   fluticasone 50 MCG/ACT nasal spray Commonly known as:  FLONASE Place 2 sprays into both nostrils daily.   furosemide 20 MG tablet Commonly known as:  LASIX Take 1 tablet (20 mg total) by mouth daily. Patient may take on additional one as needed for swelling   losartan 50 MG tablet Commonly known as:  COZAAR Take 0.5 tablets (25 mg total) by mouth daily.   metoprolol tartrate 25 MG tablet Commonly known as:  LOPRESSOR Take 1 tablet (25 mg total) by mouth 2 (two) times daily.   pantoprazole 40 MG tablet Commonly known as:  PROTONIX Take 40 mg by mouth daily before breakfast.   polyethylene glycol packet Commonly known as:  MIRALAX / GLYCOLAX Take 17 g by mouth daily as needed for mild constipation.   QUEtiapine 25 MG tablet Commonly known as:  SEROQUEL Take 25 mg by mouth at bedtime.   ranitidine 150 MG tablet Commonly known as:  ZANTAC Take 1 tablet (150 mg total) by mouth at bedtime.   SYSTANE ULTRA 0.4-0.3 % Soln Generic drug:  Polyethyl Glycol-Propyl Glycol Place 1 drop into both eyes daily as needed (for dry eyes).   traMADol 50 MG tablet Commonly known as:  ULTRAM Take 1 tablet (50 mg total) by mouth every 8 (eight) hours as needed for severe pain.   Vitamin D (Ergocalciferol) 50000 units Caps capsule Commonly known as:  DRISDOL Take 1 capsule (50,000 Units total) by mouth every 7 (seven) days.       Immunizations: Immunization History  Administered Date(s) Administered  . Influenza Split 01/01/2012  . Influenza Whole 11/30/2008  . Influenza,inj,Quad PF,36+ Mos 01/19/2013  . Pneumococcal Polysaccharide-23 11/30/2008     Physical Exam: Vitals:   05/28/16 1208  BP: 128/70  Pulse: 72  Resp: 18  Temp: 97.6 F (36.4 C)  TempSrc: Oral  Weight: 177 lb (80.3 kg)  Height: 5\' 3"  (1.6 m)   Body mass index is  31.35 kg/m.  General- elderly female, obese, frail, in no acute distress Head- normocephalic, atraumatic Throat- moist mucus membrane Eyes- PERRLA, EOMI, no pallor, no icterus, no discharge Neck- no cervical lymphadenopathy Cardiovascular- irregular heart rate, no murmur Respiratory- bilateral clear to auscultation, no wheeze, no rhonchi, no crackles, no use of accessory muscles Abdomen- bowel sounds present, soft, non tender, no guarding or rigidity Musculoskeletal- able to move all 4 extremities, generalized weakness, trace leg edema Neurological- alert and oriented to person, place and time Skin- warm and dry, psoriasis patches present Psychiatry- normal mood and affect    Labs reviewed: Basic Metabolic Panel:  Recent Labs  16/11/9601/16/18 0455 05/15/16 1712  05/16/16 1717 05/17/16 0354 05/18/16 0532 05/19/16 0400 05/20/16 0329 05/26/16  NA 144  --   < > 144 145 141 142 144 144  K 4.3  --   < > 3.8 3.7 4.1 3.9 3.8 4.6  CL 114*  --   < > 110 112* 105 103 106  --   CO2 27  --   < > 28 29 33* 34* 32  --   GLUCOSE 115*  --   < > 169* 124* 138* 112* 120*  --   BUN 21*  --   < > 19 16 13 14 15 16   CREATININE 1.03*  --   < > 0.89 0.75 0.72 0.78 0.72 1.1  CALCIUM 8.5*  --   < > 9.2 9.2 9.4 9.2 8.8*  --   MG 2.0 1.9  --  1.9 2.1 1.9  --   --   --   PHOS 1.6* 2.7  --   --  2.6  --   --   --   --   < > = values in this interval not displayed. Liver Function Tests:  Recent Labs  05/12/16 0340 05/14/16 0850 05/15/16 0455 05/26/16  AST 42* 76* 57* 29  ALT 10* 16 16 10   ALKPHOS 56 53 56 128*  BILITOT 2.8* 2.3* 1.4*  --   PROT 5.7* 5.7* 5.3*  --   ALBUMIN 3.4* 3.4* 3.0*  --    No results for input(s): LIPASE, AMYLASE in the last 8760 hours.  Recent Labs  05/12/16 0731 05/13/16 1415 05/15/16 0455  AMMONIA 31 60* 37*   CBC:  Recent Labs  12/19/15 0525 12/21/15 0533  05/11/16 2032  05/18/16 0532 05/19/16 0400 05/20/16  1610 05/26/16  WBC 10.7* 8.2  < > 11.5*  < >  10.6* 10.6* 9.8 9.2  NEUTROABS 6.1 5.7  --  7.2  --   --   --   --   --   HGB 11.5* 10.8*  < > 10.1*  < > 9.5* 9.9* 9.2* 8.5*  HCT 35.9* 33.3*  < > 30.7*  < > 29.5* 30.4* 28.3* 28*  MCV 95.7 97.1  < > 98.4  < > 98.7 99.0 100.4*  --   PLT 130* 118*  < > 123*  < > 95* 97* 107* 139*  < > = values in this interval not displayed. Cardiac Enzymes:  Recent Labs  05/16/16 1717 05/17/16 0030 05/17/16 0354  TROPONINI 0.20* 0.19* 0.18*   BNP: Invalid input(s): POCBNP CBG:  Recent Labs  05/21/16 0025 05/21/16 0417 05/21/16 0747  GLUCAP 134* 116* 102*    Radiological Exams: Dg Chest 1 View  Result Date: 05/11/2016 CLINICAL DATA:  Dementia/confusion with history of fall EXAM: CHEST 1 VIEW COMPARISON:  12/27/2015 FINDINGS: Post sternotomy changes are again evident. There is a left-sided duo lead pacing device with leads over the right atrium and right ventricle. The lungs are slightly hyperinflated. There is no acute infiltrate or effusion. No pneumothorax. Stable cardiomegaly with central vascular congestion. Aortic atherosclerosis. IMPRESSION: 1. Cardiomegaly with mild central congestion.  No overt edema 2. No acute infiltrate Electronically Signed   By: Jasmine Pang M.D.   On: 05/11/2016 23:39   Dg Lumbar Spine 2-3 Views  Result Date: 05/11/2016 CLINICAL DATA:  Increased confusion with history of fall EXAM: LUMBAR SPINE - 2-3 VIEW COMPARISON:  12/16/2015 FINDINGS: Five non rib-bearing lumbar type vertebra. SI joints are patent. Lumbar alignment stable. Mild superior endplate compression deformity of L3 minimally progressed. New mild compression deformity of T12 with approximately 25% loss of height anteriorly. Remaining vertebral bodies appear grossly maintained. Mild to moderate narrowing at L2-L3 with mild narrowing at L3-L4, L4-L5 and L5-S1. Dense atherosclerosis of the aorta IMPRESSION: 1. Slight progression of previously noted superior endplate deformity at L3 2. New mild compression  deformity of T12 Electronically Signed   By: Jasmine Pang M.D.   On: 05/11/2016 23:37   Dg Pelvis 1-2 Views  Result Date: 05/11/2016 CLINICAL DATA:  Increased confusion with history of fall EXAM: PELVIS - 1-2 VIEW COMPARISON:  12/16/2015 FINDINGS: There is no evidence of pelvic fracture or diastasis. No pelvic bone lesions are seen. Mild arthritis of the bilateral hips. IMPRESSION: No acute osseous abnormality Electronically Signed   By: Jasmine Pang M.D.   On: 05/11/2016 23:37   Dg Abd 1 View  Result Date: 05/16/2016 CLINICAL DATA:  NG tube placement EXAM: ABDOMEN - 1 VIEW COMPARISON:  Abdominal radiograph 05/14/2016 FINDINGS: Weighted enteric tube tip overlies the gastric body. Nonobstructive bowel gas pattern. IMPRESSION: Weighted enteric tube tip overlying the gastric body. Electronically Signed   By: Deatra Robinson M.D.   On: 05/16/2016 00:57   Dg Abd 1 View  Result Date: 05/14/2016 CLINICAL DATA:  71 y/o  F; feeding tube placement. EXAM: ABDOMEN - 1 VIEW COMPARISON:  12/01/2010 abdomen radiographs. FINDINGS: The bowel gas pattern is normal. Kidneys largely obscured by bowel gas. Moderate volume of stool in the colon. Enteric tube tip projects over distal stomach. IMPRESSION: Normal bowel gas pattern. Moderate volume of stool in the colon. Enteric tube tip projects over distal stomach. Electronically Signed   By: Mitzi Hansen M.D.   On: 05/14/2016 19:22  Ct Head Wo Contrast  Result Date: 05/11/2016 CLINICAL DATA:  71 year old female with altered mental status. EXAM: CT HEAD WITHOUT CONTRAST TECHNIQUE: Contiguous axial images were obtained from the base of the skull through the vertex without intravenous contrast. COMPARISON:  None. FINDINGS: Brain: There is mild age-related atrophy and chronic microvascular ischemic changes. There is no acute intracranial hemorrhage. No mass effect or midline shift noted. No intra-axial fluid collection. Vascular: No hyperdense vessel or unexpected  calcification. Skull: Normal. Negative for fracture or focal lesion. Sinuses/Orbits: No acute finding. Other: None. IMPRESSION: No acute intracranial hemorrhage. Mild age-related atrophy and chronic microvascular ischemic disease. If symptoms persist and there are no contraindications, MRI may provide better evaluation if clinically indicated. Electronically Signed   By: Elgie Collard M.D.   On: 05/11/2016 21:00   US Abdomen Limited  Result Date: 05/13/2016 CLINICAL DATA:  Abnormal transaminases. EXAM: US ABDOMEN LIMITED - RIGHT UPPER QUADRANT COMPARISON:  Ultrasound 07/30/2015. FINDINGS: Gallbladder: No gallstones or wall thickening visualized. No sonographic Murphy sign noted by sonographer. Common bile duct: Diameter: 9.6 mm Liver: Liver has a echogenic echotexture with irregular contour. Findings suggest cirrhosis. No focal hepatic abnormality identified. IMPRESSION: 1. Liver has an echogenic echotexture with irregular contour. Findings suggest cirrhosis. No focal hepatic abnormality identified. 2. Dilated common bile duct at 9.6 mm. No gallstones identified. MRCP can be obtained to further evaluate Electronically Signed   By: Maisie Fus  Register   On: 05/13/2016 11:40   US Renal Port  Result Date: 05/13/2016 CLINICAL DATA:  Acute renal failure EXAM: RENAL / URINARY TRACT ULTRASOUND COMPLETE COMPARISON:  None. FINDINGS: Right Kidney: Length: 12.2 cm. Echogenicity within normal limits. No mass or hydronephrosis visualized. Left Kidney: Length: 11.0 cm. Nonobstructing calculus in the mid LEFT renal cortex measures 10 mm. No hydronephrosis Bladder: Appears normal for degree of bladder distention. IMPRESSION: 1. LEFT nephrolithiasis. 2. No renal obstruction. Electronically Signed   By: Genevive Bi M.D.   On: 05/13/2016 21:35   Dg Chest Port 1 View  Result Date: 05/17/2016 CLINICAL DATA:  Hypoxia per ordering note; pt id checked with NT EXAM: PORTABLE CHEST 1 VIEW COMPARISON:  05/15/2016 FINDINGS:  LEFT-sided pacemaker overlies stable cardiac silhouette. NG tube and central venous line are unchanged. There is interval increase in bilateral small pleural effusions. No pneumothorax or pulmonary edema per IMPRESSION: 1. Stable support apparatus. 2. Increased bilateral pleural effusions. Electronically Signed   By: Genevive Bi M.D.   On: 05/17/2016 16:18   Dg Chest Port 1 View  Result Date: 05/15/2016 CLINICAL DATA:  Respiratory failure. EXAM: PORTABLE CHEST 1 VIEW COMPARISON:  05/13/2016. FINDINGS: Feeding tube noted with tip below left hemidiaphragm. Right IJ line stable position. Cardiac pacer with lead tips in right atrium right ventricle. Prior CABG. Cardiomegaly with normal pulmonary vascularity. Low lung volumes. Mild left base subsegmental atelectasis versus infiltrate. Small left pleural effusion IMPRESSION: 1. Feeding tube noted with tip below left hemidiaphragm. Right IJ line stable position. 2. Cardiac pacer stable position. Prior CABG. Stable cardiomegaly. 3. Low lung volumes. Mild left base subsegmental atelectasis. Mild infiltrate cannot be excluded. Small left pleural effusion cannot be excluded. Electronically Signed   By: Maisie Fus  Register   On: 05/15/2016 06:50   Dg Chest Port 1 View  Result Date: 05/13/2016 CLINICAL DATA:  Central line placement. EXAM: PORTABLE CHEST 1 VIEW COMPARISON:  05/13/2016 . FINDINGS: Right IJ line noted with tip over the superior vena cava. Mediastinum is stable. Cardiac pacer stable position. Prior CABG. Stable cardiomegaly.  No focal infiltrate. No pleural effusion or pneumothorax . IMPRESSION: 1. Interim placement of right IJ line, its tip is projected over superior vena cava. No pneumothorax. 2. Cardiac pacer stable position. Prior CABG. Stable cardiomegaly. Electronically Signed   By: Maisie Fus  Register   On: 05/13/2016 15:19   Dg Chest Port 1 View  Result Date: 05/13/2016 CLINICAL DATA:  Hypoxia over night, episode of supraventricular tachycardia.  Hypotension. EXAM: PORTABLE CHEST 1 VIEW COMPARISON:  05/11/2016. FINDINGS: Trachea is midline. Heart is enlarged. Pacemaker lead tips project over the right atrium and right ventricle. Minimal linear atelectasis or scarring in the lingula. Lungs are otherwise clear. No pleural fluid. IMPRESSION: No acute findings. Electronically Signed   By: Leanna Battles M.D.   On: 05/13/2016 09:33   Dg Abd Portable 1v  Result Date: 05/17/2016 CLINICAL DATA:  Nasogastric tube placement. EXAM: PORTABLE ABDOMEN - 1 VIEW COMPARISON:  05/16/2016 FINDINGS: Enteric tube is present with tip over the stomach in the midline upper abdomen likely in distal stomach. Bowel gas pattern is nonobstructive. Remainder of the exam is unchanged. IMPRESSION: Nonobstructive bowel gas pattern. Nasogastric tube with tip over the midline upper abdomen likely in the distal stomach. Electronically Signed   By: Elberta Fortis M.D.   On: 05/17/2016 08:06   Dg Abd Portable 1v  Result Date: 05/16/2016 CLINICAL DATA:  Nasogastric tube placement. EXAM: PORTABLE ABDOMEN - 1 VIEW COMPARISON:  Earlier today. FINDINGS: Feeding tube tip in the proximal stomach. No nasogastric tube is seen. Stable cardiac pacemaker leads. No dilated bowel loops. Lumbar spine degenerative changes. IMPRESSION: Feeding tube tip in the proximal stomach.  No nasogastric tube seen. Electronically Signed   By: Beckie Salts M.D.   On: 05/16/2016 14:12    Assessment/Plan  Anemia unspecified Has anemia of chronic disease. New Drop in hemoglobin on lab review. Patient denies symptom. She has history of gi bleed. Change her pantoprazole as below and perform guaiac test to rule out active gi bleed. Monitor Hb and vital signs. Monitor for signs of active bleed as well with her history of liver cirrhosis. On ec aspirin for now. Check cbc. Start iron supplement 325 mg daily. Monitor vital signs  History of GI bleed No bleed reported at present. Has drop in Hb. Currently on  pantoprazole 40 mg daily and ranitidine 150 mg daily. Increase pantoprazole to 40 mg bid. FOBT X 3.   Thrombocytopenia Stable. Has liver cirrhosis, no bleed reported. Monitor platelet count   Labs/tests ordered: CBC 06/01/16  Family/ staff Communication: reviewed care plan with patient and nursing supervisor   I have spent greater than 35 minutes for this encounter which includes reviewing medications and lab work, imaging, consults, addressing above mentioned concerns, reviewing care plan with patient, answering patient's concerns.      Oneal Grout, MD Internal Medicine [facility] [address] [place], [place] [address] Cell Phone (Monday-Friday 8 am - 5 pm): [phone] On Call: [phone] and follow prompts after 5 pm and on weekends Office Phone: [phone] Office Fax: [phone]

## 2016-06-05 ENCOUNTER — Non-Acute Institutional Stay (SKILLED_NURSING_FACILITY): Payer: Medicare Other | Admitting: Family

## 2016-06-05 ENCOUNTER — Encounter: Payer: Self-pay | Admitting: Family

## 2016-06-05 DIAGNOSIS — I5032 Chronic diastolic (congestive) heart failure: Secondary | ICD-10-CM | POA: Diagnosis not present

## 2016-06-05 DIAGNOSIS — I110 Hypertensive heart disease with heart failure: Secondary | ICD-10-CM

## 2016-06-05 DIAGNOSIS — I482 Chronic atrial fibrillation, unspecified: Secondary | ICD-10-CM

## 2016-06-05 DIAGNOSIS — R2681 Unsteadiness on feet: Secondary | ICD-10-CM

## 2016-06-05 DIAGNOSIS — R531 Weakness: Secondary | ICD-10-CM

## 2016-06-05 DIAGNOSIS — J441 Chronic obstructive pulmonary disease with (acute) exacerbation: Secondary | ICD-10-CM

## 2016-06-05 DIAGNOSIS — L419 Parapsoriasis, unspecified: Secondary | ICD-10-CM | POA: Diagnosis not present

## 2016-06-07 NOTE — Progress Notes (Signed)
Location:  [facility] Room Number: 1007 Place of Service:  SNF [phone])  Provider: Richarda Blade FNP-C   PCP: Irving Copas, MD Patient Care Team: Henrine Screws, MD as PCP - General (Family Medicine) Storm Frisk, MD (Pulmonary Disease)  Extended Emergency Contact Information Primary Emergency Contact: Wise,[name] Address: [address]          [place], [place] [address] D[place] of [place] Phone: [phone] Relation: Sister Secondary Emergency Contact: Spillers,Bruce Address: [address]          [place], [place] [address] [place] of [place] Mobile Phone: [phone] Relation: Son  Code Status: Full code  Goals of care:  Advanced Directive information Advanced Directives 06/05/2016  Does Patient Have a Medical Advance Directive? No  Would patient like information on creating a medical advance directive? -     Allergies  Allergen Reactions  . Novocain [Procaine] Hives and Palpitations  . Penicillins Hives and Other (See Comments)    Has patient had a PCN reaction causing immediate rash, facial/tongue/throat swelling, SOB or lightheadedness with hypotension: No Has patient had a PCN reaction causing severe rash involving mucus membranes or skin necrosis: No Has patient had a PCN reaction that required hospitalization No Has patient had a PCN reaction occurring within the last 10 years: No If all of the above answers are "NO", then may proceed with Cephalosporin use.    Chief Complaint  Patient presents with  . Discharge Note    HPI:  71 y.o. female seen today at [facility] for discharge home.She was here for short term rehabilitation for post hospital admission 05/11/2016-05/21/2016 with acute encephalopathy. CT head was negative for acute intracranial abnormality.Her urine study was suggestive of infection and she was started on antibiotic for UTI.Given her anxiety and agitation, she  required Precedex drip and Seroquel. With mildly elevated ammonia level, she was started on lactulose. She also was hypotensive and required pressor support along with IV fluids. Following this she had acute hypoxic respiratory failure from pulmonary vascular congestion and required IV diuretic. Given her deterioration liver enzymes and elevated ammonia level, she underwent laparoscopic ultrasound suggestive of cirrhosis. She has a medical history of HTN,CHF, Afib,CAD s/p CABG, Hyperlipidemia,OA, Anemia, COPD,liver cirrhosis, Hx GI bleed among other conditions.She is seen in her room today. She denies any acute issues this visit.Her lower back pain under control with current pain regimen.    During her stay here in rehab she had a hospital follow up lab work which showed a drop in her Hgb. She was asymptomatic and had no signs of bleeding. She was started on Iron supplement and her pantoprazole was increased to 40 mg twice daily.She had FOBT x 3 ordered.She has worked well with PT/OT now stable for discharge home.She will be discharged home with Home health PT/OT to continue with ROM, Exercise, Gait stability and muscle strengthening. She will  require DME Rollator to allow her to maintain current level of independence with ADL's. Home health services will be arranged by facility social worker prior to discharge.she will be discharge from the facility with her medications. Prescription medication will be written x 1 month then patient to follow up with PCP in 1-2 weeks. Facility staff report no new concerns.  Past Medical History:  Diagnosis Date  . Anemia   . Arthritis   . Asthma   . Atrial fibrillation with controlled ventricular response (HCC) 11/2009   a. not on anticoagulation due to  recurrent GI bleeding while on Eliquis.  . Chronic bronchitis   . Chronic diastolic CHF (congestive heart failure) (HCC)    a. Echo 12/2015: EF 55-60% w/ Grade 3 DD  . Chronic headache   . COPD (chronic obstructive  pulmonary disease) (HCC)   . Depression   . Heart attack   . Hyperlipidemia   . Hypertension     Past Surgical History:  Procedure Laterality Date  . CAD-CABG     x7, brief post-op Atrial Fib  . CESAREAN SECTION     x2  . COLONOSCOPY WITH PROPOFOL N/A 10/24/2015   Procedure: COLONOSCOPY WITH PROPOFOL;  Surgeon: Carman Ching, MD;  Location: [facility] ENDOSCOPY;  Service: Endoscopy;  Laterality: N/A;  . CORONARY ARTERY BYPASS GRAFT  2011  . EP IMPLANTABLE DEVICE N/A 12/26/2015   Procedure: Pacemaker Implant;  Surgeon: Marinus Maw, MD;  Location: [facility] INVASIVE CV LAB;  Service: Cardiovascular;  Laterality: N/A;  . ESOPHAGOGASTRODUODENOSCOPY N/A 10/22/2015   Procedure: ESOPHAGOGASTRODUODENOSCOPY (EGD);  Surgeon: Carman Ching, MD;  Location: [facility] ENDOSCOPY;  Service: Endoscopy;  Laterality: N/A;  . ESOPHAGOGASTRODUODENOSCOPY N/A 12/24/2015   Procedure: ESOPHAGOGASTRODUODENOSCOPY (EGD);  Surgeon: Carman Ching, MD;  Location: [facility] ENDOSCOPY;  Service: Endoscopy;  Laterality: N/A;  . JOINT REPLACEMENT Bilateral   . TONSILLECTOMY    . TOTAL KNEE ARTHROPLASTY        reports that she has quit smoking. Her smoking use included Cigarettes. She smoked 0.20 packs per day. She has never used smokeless tobacco. She reports that she does not drink alcohol or use drugs. Social History   Social History  . Marital status: Divorced    Spouse name: N/A  . Number of children: N/A  . Years of education: N/A   Occupational History  . retired Clinical biochemist   Social History Main Topics  . Smoking status: Former Smoker    Packs/day: 0.20    Types: Cigarettes  . Smokeless tobacco: Never Used  . Alcohol use No  . Drug use: No  . Sexual activity: Not on file   Other Topics Concern  . Not on file   Social History Narrative  . No narrative on file    Allergies  Allergen Reactions  . Novocain [Procaine] Hives and Palpitations  . Penicillins Hives and Other (See Comments)    Has patient had a  PCN reaction causing immediate rash, facial/tongue/throat swelling, SOB or lightheadedness with hypotension: No Has patient had a PCN reaction causing severe rash involving mucus membranes or skin necrosis: No Has patient had a PCN reaction that required hospitalization No Has patient had a PCN reaction occurring within the last 10 years: No If all of the above answers are "NO", then may proceed with Cephalosporin use.    Pertinent  Health Maintenance Due  Topic Date Due  . FOOT EXAM  10/24/1955  . OPHTHALMOLOGY EXAM  10/24/1955  . PNA vac Low Risk Adult (2 of 2 - PCV13) 11/30/2009  . HEMOGLOBIN A1C  05/28/2010  . MAMMOGRAM  07/15/2015  . INFLUENZA VACCINE  09/30/2016  . COLONOSCOPY  10/23/2025  . DEXA SCAN  Completed    Medications: Allergies as of 06/05/2016      Reactions   Novocain [procaine] Hives, Palpitations   Penicillins Hives, Other (See Comments)   Has patient had a PCN reaction causing immediate rash, facial/tongue/throat swelling, SOB or lightheadedness with hypotension: No Has patient had a PCN reaction causing severe rash involving mucus membranes or skin necrosis: No Has patient had a  PCN reaction that required hospitalization No Has patient had a PCN reaction occurring within the last 10 years: No If all of the above answers are "NO", then may proceed with Cephalosporin use.      Medication List       Accurate as of 06/05/16 11:59 PM. Always use your most recent med list.          albuterol 108 (90 Base) MCG/ACT inhaler Commonly known as:  PROVENTIL HFA;VENTOLIN HFA Inhale 2 puffs into the lungs every 6 (six) hours as needed for wheezing or shortness of breath.   aspirin 81 MG chewable tablet Chew 81 mg by mouth daily.   atorvastatin 20 MG tablet Commonly known as:  LIPITOR Take 20 mg by mouth daily.   budesonide-formoterol 160-4.5 MCG/ACT inhaler Commonly known as:  SYMBICORT Inhale 2 puffs into the lungs 2 (two) times daily.   clobetasol cream 0.05  % Commonly known as:  TEMOVATE Apply 1 application topically 2 (two) times daily. Affected skin   docusate sodium 100 MG capsule Commonly known as:  COLACE Take 100 mg by mouth 2 (two) times daily.   ferrous sulfate 325 (65 FE) MG tablet Take 325 mg by mouth. Take one tablet daily for anemia   fluticasone 50 MCG/ACT nasal spray Commonly known as:  FLONASE Place 2 sprays into both nostrils daily.   furosemide 20 MG tablet Commonly known as:  LASIX Take 1 tablet (20 mg total) by mouth daily. Patient may take on additional one as needed for swelling   losartan 50 MG tablet Commonly known as:  COZAAR Take 0.5 tablets (25 mg total) by mouth daily.   metoprolol tartrate 25 MG tablet Commonly known as:  LOPRESSOR Take 1 tablet (25 mg total) by mouth 2 (two) times daily.   pantoprazole 40 MG tablet Commonly known as:  PROTONIX Take 40 mg by mouth daily before breakfast.   polyethylene glycol packet Commonly known as:  MIRALAX / GLYCOLAX Take 17 g by mouth daily.   QUEtiapine 25 MG tablet Commonly known as:  SEROQUEL Take by mouth. 1/2 tablet at 8 pm   ranitidine 150 MG tablet Commonly known as:  ZANTAC Take 1 tablet (150 mg total) by mouth at bedtime.   SYSTANE ULTRA 0.4-0.3 % Soln Generic drug:  Polyethyl Glycol-Propyl Glycol Place 1 drop into both eyes daily as needed (for dry eyes).   traMADol 50 MG tablet Commonly known as:  ULTRAM Take by mouth. Take one tablet every 6 hours as needed for pain   Vitamin D (Ergocalciferol) 50000 units Caps capsule Commonly known as:  DRISDOL Take 1 capsule (50,000 Units total) by mouth every 7 (seven) days.       Review of Systems  Constitutional: Negative for activity change, appetite change, chills, fatigue and fever.  HENT: Negative for congestion, rhinorrhea, sinus pain, sinus pressure, sneezing and sore throat.   Eyes: Negative.   Respiratory: Negative for cough, chest tightness, shortness of breath and wheezing.     Cardiovascular: Negative for chest pain, palpitations and leg swelling.  Gastrointestinal: Negative for abdominal distention, abdominal pain, constipation, diarrhea, nausea and vomiting.  Endocrine: Negative.   Genitourinary: Negative for dysuria, flank pain, frequency and urgency.  Musculoskeletal: Positive for gait problem.       Lower back pain under control with current regimen  Skin: Negative for color change, pallor and rash.  Neurological: Negative for dizziness, seizures, syncope, light-headedness and headaches.  Hematological: Does not bruise/bleed easily.  Psychiatric/Behavioral: Negative for agitation,  confusion, hallucinations and sleep disturbance. The patient is not nervous/anxious.     Vitals:   06/05/16 0915  BP: (!) 123/59  Pulse: 61  Resp: 16  Temp: 97.1 F (36.2 C)  SpO2: 96%  Weight: 179 lb 3.2 oz (81.3 kg)  Height: 5\' 3"  (1.6 m)   Body mass index is 31.74 kg/m. Physical Exam  Constitutional: She is oriented to person, place, and time. She appears well-developed and well-nourished.  HENT:  Head: Normocephalic.  Mouth/Throat: Oropharynx is clear and moist. No oropharyngeal exudate.  Eyes: Conjunctivae and EOM are normal. Pupils are equal, round, and reactive to light. Right eye exhibits no discharge. Left eye exhibits no discharge. No scleral icterus.  Neck: Normal range of motion. No JVD present. No thyromegaly present.  Cardiovascular: Normal rate, regular rhythm, normal heart sounds and intact distal pulses.  Exam reveals no gallop and no friction rub.   No murmur heard. Pulmonary/Chest: Effort normal and breath sounds normal. No respiratory distress. She has no wheezes. She has no rales.  Abdominal: Soft. Bowel sounds are normal. She exhibits no distension. There is no tenderness. There is no rebound and no guarding.  Genitourinary:  Genitourinary Comments: Continent   Musculoskeletal:  Moves x 4 extremities. Unsteady gait uses Rolator.trace -1+ edema  to lower extremities.   Lymphadenopathy:    She has no cervical adenopathy.  Neurological: She is oriented to person, place, and time.  Skin: Skin is warm and dry. No rash noted. No erythema. No pallor.  Psychiatric: She has a normal mood and affect.    Labs reviewed: Basic Metabolic Panel:  Recent Labs  16/10/96 0455 05/15/16 1712  05/16/16 1717 05/17/16 0354 05/18/16 0532 05/19/16 0400 05/20/16 0329 05/26/16  NA 144  --   < > 144 145 141 142 144 144  K 4.3  --   < > 3.8 3.7 4.1 3.9 3.8 4.6  CL 114*  --   < > 110 112* 105 103 106  --   CO2 27  --   < > 28 29 33* 34* 32  --   GLUCOSE 115*  --   < > 169* 124* 138* 112* 120*  --   BUN 21*  --   < > 19 16 13 14 15 16   CREATININE 1.03*  --   < > 0.89 0.75 0.72 0.78 0.72 1.1  CALCIUM 8.5*  --   < > 9.2 9.2 9.4 9.2 8.8*  --   MG 2.0 1.9  --  1.9 2.1 1.9  --   --   --   PHOS 1.6* 2.7  --   --  2.6  --   --   --   --   < > = values in this interval not displayed. Liver Function Tests:  Recent Labs  05/12/16 0340 05/14/16 0850 05/15/16 0455 05/26/16  AST 42* 76* 57* 29  ALT 10* 16 16 10   ALKPHOS 56 53 56 128*  BILITOT 2.8* 2.3* 1.4*  --   PROT 5.7* 5.7* 5.3*  --   ALBUMIN 3.4* 3.4* 3.0*  --     Recent Labs  05/12/16 0731 05/13/16 1415 05/15/16 0455  AMMONIA 31 60* 37*   CBC:  Recent Labs  12/19/15 0525 12/21/15 0533  05/11/16 2032  05/18/16 0532 05/19/16 0400 05/20/16 0329 05/26/16  WBC 10.7* 8.2  < > 11.5*  < > 10.6* 10.6* 9.8 9.2  NEUTROABS 6.1 5.7  --  7.2  --   --   --   --   --  HGB 11.5* 10.8*  < > 10.1*  < > 9.5* 9.9* 9.2* 8.5*  HCT 35.9* 33.3*  < > 30.7*  < > 29.5* 30.4* 28.3* 28*  MCV 95.7 97.1  < > 98.4  < > 98.7 99.0 100.4*  --   PLT 130* 118*  < > 123*  < > 95* 97* 107* 139*  < > = values in this interval not displayed. Cardiac Enzymes:  Recent Labs  05/16/16 1717 05/17/16 0030 05/17/16 0354  TROPONINI 0.20* 0.19* 0.18*    Recent Labs  05/21/16 0025 05/21/16 0417 05/21/16 0747   GLUCAP 134* 116* 102*   Assessment/Plan:   1. Unsteady gait  Has worked well with PT/ OT. Will discharge home PT/OT to continue with ROM, Exercise, Gait stability and muscle strengthening. She will require DME Rolator to allow her to maintain current level of independence with ADL's. Fall and safety precautions.  2. Generalized weakness Has improved with Therapy.Will discharge home PT/OT to continue with muscle strengthening.  3. Hypertensive heart disease with CHF B/p stable.continue on metoprolol, losartan and furosemide. Recheck BMP in 1-2 weeks with PCP.    4. Chronic atrial fibrillation  HR controlled.continue on metoprolol.    5. Chronic diastolic CHF (congestive heart failure) Stable.No recent abrupt weight gain. Has trace -1+ edema to lower extremities. Exam findings negative for shortness of breath, wheezing or rales. Continue on Furosemide and Metoprolol.  6. COPD exacerbation Breathing stable. Continue on albuterol and Symbicort.   7. psoriasis Continue on clobetasol cream 0.05 % twice daily to affected areas.continue to monitor for flare ups.   Patient is being discharged with the following home health services:   -PT/OT for ROM, exercise, gait stability and muscle strengthening  Patient is being discharged with the following durable medical equipment:    - Rollator  to allow  her to maintain current level of independence.  Patient has been advised to f/u with their PCP in 1-2 weeks to for a transitions of care visit.Social services at their facility was responsible for arranging this appointment.  Pt was provided with adequate prescriptions of noncontrolled medications to reach the scheduled appointment.For controlled substances, a limited supply was provided as appropriate for the individual patient. If the pt normally receives these medications from a pain clinic or has a contract with another physician, these medications should be received from that clinic or  physician only).    Future labs/tests needed:  CBC, BMP in 1-2 weeks PCP

## 2016-06-08 ENCOUNTER — Encounter: Payer: Self-pay | Admitting: Family

## 2016-06-08 NOTE — Progress Notes (Signed)
This encounter was created in error - please disregard.

## 2016-06-11 ENCOUNTER — Telehealth: Payer: Self-pay | Admitting: Cardiology

## 2016-06-11 NOTE — Telephone Encounter (Signed)
New Message     Pt PCP put her on baby aspirin regimen instead of the Eliquis, please ask Dr Anne Fu if she is suppose to be taking the Eliquis, and she is having a little fluttering    Pt refused appt

## 2016-06-11 NOTE — Telephone Encounter (Signed)
Pt had been cleared to restart Eliquis at last office visit with Dr Anne Fu on 04/28/16 but it is unclear whether she did or not.  She was then admitted 3/12 for altered mental status/acute encephalopathy.  During this hospitalization Hgb went from 10.1 down to 8.7.  Last check on 3/27 Hgb was 8.5.  She is on ASA 81 mg and is calling to see given her Hx of At Fib if she should restart Eliquis.  Will forward to Dr Anne Fu for review.

## 2016-06-12 NOTE — Telephone Encounter (Signed)
NA mailbox is full.  

## 2016-06-12 NOTE — Telephone Encounter (Signed)
OK to restart Eliquis Stop ASA Check CBC in 2 weeks Donato Schultz, MD

## 2016-06-16 NOTE — Telephone Encounter (Signed)
NA. Mail box is full 

## 2016-06-19 ENCOUNTER — Other Ambulatory Visit: Payer: Self-pay | Admitting: Internal Medicine

## 2016-06-26 ENCOUNTER — Encounter: Payer: Self-pay | Admitting: Cardiology

## 2016-06-29 ENCOUNTER — Telehealth: Payer: Self-pay | Admitting: Cardiology

## 2016-06-29 NOTE — Telephone Encounter (Signed)
New message    Pt is calling ask for RN to call back about her medications.

## 2016-06-29 NOTE — Telephone Encounter (Signed)
Spoke with pt who has questions about her medications.  Advised she will need to keep appt for any adjustments to be made to medications by Dr Anne Fu.  She states understanding.

## 2016-06-29 NOTE — Telephone Encounter (Signed)
Spoke with pt.  She has an appt 5/1 at 2:20 with Dr Anne Fu for f/u.

## 2016-06-30 ENCOUNTER — Encounter (INDEPENDENT_AMBULATORY_CARE_PROVIDER_SITE_OTHER): Payer: Self-pay

## 2016-06-30 ENCOUNTER — Ambulatory Visit: Payer: Medicare Other | Admitting: Cardiology

## 2016-06-30 ENCOUNTER — Encounter: Payer: Self-pay | Admitting: Cardiology

## 2016-06-30 ENCOUNTER — Ambulatory Visit (INDEPENDENT_AMBULATORY_CARE_PROVIDER_SITE_OTHER): Payer: Medicare Other | Admitting: Cardiology

## 2016-06-30 VITALS — BP 90/56 | HR 60 | Ht 63.0 in | Wt 173.2 lb

## 2016-06-30 DIAGNOSIS — Z950 Presence of cardiac pacemaker: Secondary | ICD-10-CM

## 2016-06-30 DIAGNOSIS — I495 Sick sinus syndrome: Secondary | ICD-10-CM

## 2016-06-30 DIAGNOSIS — J438 Other emphysema: Secondary | ICD-10-CM | POA: Diagnosis not present

## 2016-06-30 DIAGNOSIS — I5032 Chronic diastolic (congestive) heart failure: Secondary | ICD-10-CM

## 2016-06-30 DIAGNOSIS — I480 Paroxysmal atrial fibrillation: Secondary | ICD-10-CM | POA: Diagnosis not present

## 2016-06-30 MED ORDER — METOPROLOL TARTRATE 25 MG PO TABS: 25.0000 mg | ORAL_TABLET | Freq: Every day | ORAL | Status: DC

## 2016-06-30 NOTE — Patient Instructions (Signed)
Medication Instructions:  Please stop your Losartan. Decrease Metoprolol to 25 mg at bedtime. Continue all other medications as listed.  Follow-Up: Follow up in 2 to 3 weeks with Carlean Jews, PA  Thank you for choosing [facility]!!

## 2016-06-30 NOTE — Progress Notes (Signed)
[address], [address] [place], [place]  [address] Phone: [phone] Fax:  [phone]  Date:  06/30/2016   ID:  Gabrielle [name], Gabrielle [name] May 08, 1945, MRN [id number]  PCP:  Gabrielle Copas, MD   History of Present Illness: Gabrielle [name] is a 71 y.o. female  with coronary artery disease status post bypass surgery in 2010, paroxysmal atrial fibrillation, tachycardia-bradycardia syndrome with pacemaker placement, EF 45-50%. COPD, obesity who unfortunately was hospitalized multiple times in 2017 with significant lower GI bleed, bright red blood, hematochezia with hemoglobin of 4 requiring a total of 6 units of packed red blood cells, 4 during initial admission and 2 during a repeat admission after she had hypotension and more blood loss. Dr. Randa Evens GI. Friable mucosa noted on colonoscopy. Presumed diverticular bleed. She has been off of her Eliquis since then. Please see below for full details.  She had prolonged QT as well, stopped Celexa and sotalol.  Gabrielle [name] twin.  06/30/16-she was hospitalized again in March with delirium, taking Xanax, muscle relaxants, had UTI as well. Hemoglobin remained 8.5 on discharge. 2 low for anticoagulation. See below. No chest pain, baseline shortness of breath noted.   Wt Readings from Last 3 Encounters:  06/30/16 173 lb 3.2 oz (78.6 kg)  06/05/16 179 lb 3.2 oz (81.3 kg)  05/28/16 177 lb (80.3 kg)     Past Medical History:  Diagnosis Date  . Anemia   . Arthritis   . Asthma   . Atrial fibrillation with controlled ventricular response (HCC) 11/2009   a. not on anticoagulation due to recurrent GI bleeding while on Eliquis.  . Chronic bronchitis   . Chronic diastolic CHF (congestive heart failure) (HCC)    a. Echo 12/2015: EF 55-60% w/ Grade 3 DD  . Chronic headache   . COPD (chronic obstructive pulmonary disease) (HCC)   . Depression   . Heart attack (HCC)   . Hyperlipidemia   . Hypertension     Past Surgical History:  Procedure  Laterality Date  . CAD-CABG     x7, brief post-op Atrial Fib  . CESAREAN SECTION     x2  . COLONOSCOPY WITH PROPOFOL N/A 10/24/2015   Procedure: COLONOSCOPY WITH PROPOFOL;  Surgeon: Carman Ching, MD;  Location: [facility] ENDOSCOPY;  Service: Endoscopy;  Laterality: N/A;  . CORONARY ARTERY BYPASS GRAFT  2011  . EP IMPLANTABLE DEVICE N/A 12/26/2015   Procedure: Pacemaker Implant;  Surgeon: Marinus Maw, MD;  Location: [facility] INVASIVE CV LAB;  Service: Cardiovascular;  Laterality: N/A;  . ESOPHAGOGASTRODUODENOSCOPY N/A 10/22/2015   Procedure: ESOPHAGOGASTRODUODENOSCOPY (EGD);  Surgeon: Carman Ching, MD;  Location[facility] ENDOSCOPY;  Service: Endoscopy;  Laterality: N/A;  . ESOPHAGOGASTRODUODENOSCOPY N/A 12/24/2015   Procedure: ESOPHAGOGASTRODUODENOSCOPY (EGD);  Surgeon: Carman Ching, MD;  Location: [facility] ENDOSCOPY;  Service: Endoscopy;  Laterality: N/A;  . JOINT REPLACEMENT Bilateral   . TONSILLECTOMY    . TOTAL KNEE ARTHROPLASTY      Current Outpatient Prescriptions  Medication Sig Dispense Refill  . albuterol (PROVENTIL HFA;VENTOLIN HFA) 108 (90 Base) MCG/ACT inhaler Inhale 2 puffs into the lungs every 6 (six) hours as needed for wheezing or shortness of breath.    Marland Kitchen atorvastatin (LIPITOR) 20 MG tablet Take 20 mg by mouth daily.    . budesonide-formoterol (SYMBICORT) 160-4.5 MCG/ACT inhaler Inhale 2 puffs into the lungs 2 (two) times daily. 10.2 g 6  . clobetasol cream (TEMOVATE) 0.05 % Apply 1 application topically 2 (two) times daily. Affected skin    .  docusate sodium (COLACE) 100 MG capsule Take 100 mg by mouth 2 (two) times daily.     . fluticasone (FLONASE) 50 MCG/ACT nasal spray Place 2 sprays into both nostrils daily. 16 g 0  . furosemide (LASIX) 20 MG tablet Take 1 tablet (20 mg total) by mouth daily. Patient may take on additional one as needed for swelling    . metoprolol tartrate (LOPRESSOR) 25 MG tablet Take 1 tablet (25 mg total) by mouth at bedtime.    . pantoprazole (PROTONIX) 40 MG  tablet Take 40 mg by mouth daily before breakfast.     . Polyethyl Glycol-Propyl Glycol (SYSTANE ULTRA) 0.4-0.3 % SOLN Place 1 drop into both eyes daily as needed (for dry eyes).     . polyethylene glycol (MIRALAX / GLYCOLAX) packet Take 17 g by mouth daily.    . QUEtiapine (SEROQUEL) 25 MG tablet Take by mouth. 1/2 tablet at 8 pm    . ranitidine (ZANTAC) 150 MG tablet Take 1 tablet (150 mg total) by mouth at bedtime.    . traMADol (ULTRAM) 50 MG tablet Take by mouth. Take one tablet every 6 hours as needed for pain    . Vitamin D, Ergocalciferol, (DRISDOL) 50000 units CAPS capsule Take 1 capsule (50,000 Units total) by mouth every 7 (seven) days.     No current facility-administered medications for this visit.     Allergies:    Allergies  Allergen Reactions  . Novocain [Procaine] Hives and Palpitations  . Penicillins Hives and Other (See Comments)    Has patient had a PCN reaction causing immediate rash, facial/tongue/throat swelling, SOB or lightheadedness with hypotension: No Has patient had a PCN reaction causing severe rash involving mucus membranes or skin necrosis: No Has patient had a PCN reaction that required hospitalization No Has patient had a PCN reaction occurring within the last 10 years: No If all of the above answers are "NO", then may proceed with Cephalosporin use.    Social History:  The patient  reports that she has quit smoking. Her smoking use included Cigarettes. She smoked 0.20 packs per day. She has never used smokeless tobacco. She reports that she does not drink alcohol or use drugs.   Family History  Problem Relation Age of Onset  . Diabetes Mother   . Hypertension Mother   . Heart disease Mother     before age 12  . Heart disease Father     before age 20  . Hypertension Father   . Heart attack Father   . Lung cancer Maternal Grandfather   . Lung cancer Paternal Grandfather   . Hypertension Daughter   . Stroke Paternal Grandmother     ROS:  Poor  eyesight noted. Unless specified above all other review of systems negative.  PHYSICAL EXAM: VS:  BP (!) 90/56   Pulse 60   Ht  (1.6 m)   Wt 173 lb 3.2 oz (78.6 kg)   LMP  (LMP Unknown)   BMI 30.68 kg/m  GEN: Well nourished, well developed, in no acute distress in wheelchair HEENT: normal  Neck: no JVD, carotid bruits, or masses Cardiac: RRR; no murmurs, rubs, or gallops,no edema  Respiratory:  clear to auscultation bilaterally, normal work of breathing GI: soft, nontender, nondistended, + BS MS: no deformity or atrophy  Skin: warm and dry, no rash Neuro:  Alert and Oriented x 3, Strength and sensation are intact Psych: euthymic mood, full affect   EKG:  No EKG today-Prior 01/07/15-sinus bradycardia  rate 54 with frequent PVCs, low amplitude P wave, poor R-wave progression, T-wave inversion in 1 and aVL. 07/04/14-sinus rhythm, 72, nonspecific ST-T wave changes -prior 02/07/14-sinus rhythm, 61, inferior infarct pattern, nonspecific ST-T wave changes. Poor R-wave progression.   Labs: Hospital labs reviewed.  ASSESSMENT AND PLAN:   Paroxysmal atrial fibrillation  - Last ECG on 05/16/16 shows sinus rhythm.  - She has been off of sotalol.  - Pacemaker previously placed because of bradycardia.  - We will decrease her metoprolol to 25 mg at night. I tried to get her to stop it but she does not desire to do so.  - We will also stop her losartan which she is currently taking it 25 mg because of her hypotension.  Chronic anti-coagulation  - Previously on Eliquis but she had severe bleeding, 4 units of blood then repeat admission again with 2 units of blood with hemoglobin of 4 on admit. Friable tissue on colonoscopy. Recurrent hematochezia.  - Last hemoglobin is 8.5  - I think the risks of resuming anticoagulation outweigh the benefit.  - She understands that she is at risk for stroke. She also has had delirium as well.  Coronary artery disease  - Post CABG, stable, no angina  Sick  sinus syndrome-pacemaker placement  - Stable.  COPD  -  Per primary team  - Continue to encourage smoking cessation.  Chronic diastolic heart failure  - No edema on exam today. Be careful with Lasix given her hypotension.  Psoriasis  - Methotrexate   2 week follow up, would be great if she saw APP If blood pressure remains low, discontinue metoprolol.Mathews Robinsons, MD [facility]  06/30/2016 3:07 PM

## 2016-07-16 NOTE — Progress Notes (Deleted)
Cardiology Office Note    Date:  07/16/2016   ID:  Claudie ReveringVirginia W [name], DOB May 02, 1945, MRN [id number]  PCP:  Henrine Screwshacker, Robert, MD  Cardiologist:  Dr. Anne FuSkains / Dr. Ladona Ridgelaylor (EP)  CC: follow up.   History of Present Illness:  [place] [name] is a 71 y.o. female with a history of CAD s/p CABG (2010), PAF, tachy brady s/p PPM (12/2015), morbid obesity, COPD, tobacco abuse, and history of recurrent GI bleed who presents to clinic for follow up.  She was admitted multiple times in 2017 for recurrent GI bleed felt to be diverticular. Hg was down to 4 at one point and she required 6 U PRBCs. Friable mucosa was noted on colonoscopy as well as esophageal ulcers on EGD. She was taken off her Eliquis since that time. During 12/2015 admission she had tachy brady syndrome requiring PPM placement.   She also has a history of prolonged QT and sotolol and Celexa were discontinued. She was admitted in 3/12-3/22/18 for acute encephalopathy in the setting of polypharmacy (benzos and muscle relaxants) and UTI.   She was last seen by Dr. Anne FuSkains on 06/30/16 for follow up and BP noted to be 90/56. Her metoprolol tart was decreased to 25mg  at night and Losartan was discontinued. Plan was to discontinued metoprolol at follow up if BP remained low.   Today she presents to clinic for follow up.    Past Medical History:  Diagnosis Date  . Anemia   . Arthritis   . Asthma   . Atrial fibrillation with controlled ventricular response (HCC) 11/2009   a. not on anticoagulation due to recurrent GI bleeding while on Eliquis.  . Chronic bronchitis   . Chronic diastolic CHF (congestive heart failure) (HCC)    a. Echo 12/2015: EF 55-60% w/ Grade 3 DD  . Chronic headache   . COPD (chronic obstructive pulmonary disease) (HCC)   . Depression   . Heart attack (HCC)   . Hyperlipidemia   . Hypertension     Past Surgical History:  Procedure Laterality Date  . CAD-CABG     x7, brief post-op Atrial Fib  . CESAREAN  SECTION     x2  . COLONOSCOPY WITH PROPOFOL N/A 10/24/2015   Procedure: COLONOSCOPY WITH PROPOFOL;  Surgeon: Carman ChingJames Edwards, MD;  [facility];  Service: Endoscopy;  Laterality: N/A;  . CORONARY ARTERY BYPASS GRAFT  2011  . EP IMPLANTABLE DEVICE N/A 12/26/2015   Procedure: Pacemaker Implant;  Surgeon: Marinus MawGregg W Taylor, MD;  [facility];  Service: Cardiovascular;  Laterality: N/A;  . ESOPHAGOGASTRODUODENOSCOPY N/A 10/22/2015   Procedure: ESOPHAGOGASTRODUODENOSCOPY (EGD);  Surgeon: Carman ChingJames Edwards, MD;  [facility];  Service: Endoscopy;  Laterality: N/A;  . ESOPHAGOGASTRODUODENOSCOPY N/A 12/24/2015   Procedure: ESOPHAGOGASTRODUODENOSCOPY (EGD);  Surgeon: Carman ChingJames Edwards, MD;  [facility];  Service: Endoscopy;  Laterality: N/A;  . JOINT REPLACEMENT Bilateral   . TONSILLECTOMY    . TOTAL KNEE ARTHROPLASTY      Current Medications: Outpatient Medications Prior to Visit  Medication Sig Dispense Refill  . albuterol (PROVENTIL HFA;VENTOLIN HFA) 108 (90 Base) MCG/ACT inhaler Inhale 2 puffs into the lungs every 6 (six) hours as needed for wheezing or shortness of breath.    Marland Kitchen. atorvastatin (LIPITOR) 20 MG tablet Take 20 mg by mouth daily.    . budesonide-formoterol (SYMBICORT) 160-4.5 MCG/ACT inhaler Inhale 2 puffs into the lungs 2 (two) times daily. 10.2 g 6  . clobetasol cream (TEMOVATE) 0.05 % Apply 1 application  topically 2 (two) times daily. Affected skin    . docusate sodium (COLACE) 100 MG capsule Take 100 mg by mouth 2 (two) times daily.     . fluticasone (FLONASE) 50 MCG/ACT nasal spray Place 2 sprays into both nostrils daily. 16 g 0  . furosemide (LASIX) 20 MG tablet Take 1 tablet (20 mg total) by mouth daily. Patient may take on additional one as needed for swelling    . metoprolol tartrate (LOPRESSOR) 25 MG tablet Take 1 tablet (25 mg total) by mouth at bedtime.    . pantoprazole (PROTONIX) 40 MG tablet Take 40 mg by mouth daily before breakfast.      . Polyethyl Glycol-Propyl Glycol (SYSTANE ULTRA) 0.4-0.3 % SOLN Place 1 drop into both eyes daily as needed (for dry eyes).     . polyethylene glycol (MIRALAX / GLYCOLAX) packet Take 17 g by mouth daily.    . QUEtiapine (SEROQUEL) 25 MG tablet Take by mouth. 1/2 tablet at 8 pm    . ranitidine (ZANTAC) 150 MG tablet Take 1 tablet (150 mg total) by mouth at bedtime.    . traMADol (ULTRAM) 50 MG tablet Take by mouth. Take one tablet every 6 hours as needed for pain    . Vitamin D, Ergocalciferol, (DRISDOL) 50000 units CAPS capsule Take 1 capsule (50,000 Units total) by mouth every 7 (seven) days.     No facility-administered medications prior to visit.      Allergies:   Novocain [procaine] and Penicillins   Social History   Social History  . Marital status: Divorced    Spouse name: N/A  . Number of children: N/A  . Years of education: N/A   Occupational History  . retired Clinical biochemist   Social History Main Topics  . Smoking status: Former Smoker    Packs/day: 0.20    Types: Cigarettes  . Smokeless tobacco: Never Used  . Alcohol use No  . Drug use: No  . Sexual activity: Not on file   Other Topics Concern  . Not on file   Social History Narrative  . No narrative on file     Family History:  The patient's ***family history includes Diabetes in her mother; Heart attack in her father; Heart disease in her father and mother; Hypertension in her daughter, father, and mother; Lung cancer in her maternal grandfather and paternal grandfather; Stroke in her paternal grandmother.      *** ROS/PE    Wt Readings from Last 3 Encounters:  06/30/16 173 lb 3.2 oz (78.6 kg)  06/05/16 179 lb 3.2 oz (81.3 kg)  05/28/16 177 lb (80.3 kg)      Studies/Labs Reviewed:   EKG:  EKG is*** ordered today.  The ekg ordered today demonstrates ***  Recent Labs: 12/16/2015: B Natriuretic Peptide 751.9 05/13/2016: TSH 0.644 05/18/2016: Magnesium 1.9 05/26/2016: ALT 10; BUN 16;  Creatinine 1.1; Hemoglobin 8.5; Platelets 139; Potassium 4.6; Sodium 144   Lipid Panel    Component Value Date/Time   CHOL  11/28/2009 0343    112        ATP III CLASSIFICATION:  <200     mg/dL   Desirable  161-096  mg/dL   Borderline High  >=045    mg/dL   High          TRIG 99 11/28/2009 0343   HDL 52 11/28/2009 0343   CHOLHDL 2.2 11/28/2009 0343   VLDL 20 11/28/2009 0343   LDLCALC  11/28/2009 0343  40        Total Cholesterol/HDL:CHD Risk Coronary Heart Disease Risk Table                     Men   Women  1/2 Average Risk   3.4   3.3  Average Risk       5.0   4.4  2 X Average Risk   9.6   7.1  3 X Average Risk  23.4   11.0        Use the calculated Patient Ratio above and the CHD Risk Table to determine the patient's CHD Risk.        ATP III CLASSIFICATION (LDL):  <100     mg/dL   Optimal  161-096  mg/dL   Near or Above                    Optimal  130-159  mg/dL   Borderline  045-409  mg/dL   High  >811     mg/dL   Very High    Additional studies/ records that were reviewed today include:  2D ECHO: 05/17/2016 LV EF: 55% Study Conclusions - Left ventricle: The cavity size was mildly dilated. Systolic   function was normal. The estimated ejection fraction was 55%.   There is akinesis of the apical myocardium. There is akinesis of   the basalinferior myocardium. There is akinesis of the   apicalanterior myocardium. There was a reduced contribution of   atrial contraction to ventricular filling, due to increased   ventricular diastolic pressure or atrial contractile dysfunction.   Doppler parameters are consistent with a reversible restrictive   pattern, indicative of decreased left ventricular diastolic   compliance and/or increased left atrial pressure (grade 3   diastolic dysfunction). Doppler parameters are consistent with   high ventricular filling pressure. - Aortic valve: There was mild regurgitation. - Mitral valve: Severely calcified annulus. There was  moderate   regurgitation directed posteriorly. Mean gradient (D): 7 mm Hg. - Left atrium: The atrium was mildly dilated. - Right ventricle: Pacer wire or catheter noted in right ventricle. - Right atrium: The atrium was mildly dilated. - Tricuspid valve: There was moderate regurgitation. - Pulmonic valve: There was mild regurgitation. - Pulmonary arteries: PA peak pressure: 59 mm Hg (S). Impressions: - The right ventricular systolic pressure was increased consistent   with moderate pulmonary hypertension.   ASSESSMENT & PLAN:   Hypotension:  PAF:  CAD s/p CABG (2010):   COPD with ongoing tobacco abuse:  Tachy brady s/p PPM:  Prolonged QT:   Medication Adjustments/Labs and Tests Ordered: Current medicines are reviewed at length with the patient today.  Concerns regarding medicines are outlined above.  Medication changes, Labs and Tests ordered today are listed in the Patient Instructions below. There are no Patient Instructions on file for this visit.   Signed, Cline Crock, PA-C  07/16/2016 3:40 PM    [facility] [address] [place], [place], [place]  [address] Phone: [phone]; Fax: [phone]

## 2016-07-23 ENCOUNTER — Ambulatory Visit: Payer: Medicare Other | Admitting: Physician Assistant

## 2016-08-10 ENCOUNTER — Telehealth: Payer: Self-pay | Admitting: Cardiology

## 2016-08-10 ENCOUNTER — Ambulatory Visit (INDEPENDENT_AMBULATORY_CARE_PROVIDER_SITE_OTHER): Payer: Medicare Other | Admitting: *Deleted

## 2016-08-10 DIAGNOSIS — I495 Sick sinus syndrome: Secondary | ICD-10-CM | POA: Diagnosis not present

## 2016-08-10 NOTE — Telephone Encounter (Signed)
Attempted to confirm remote transmission with pt. No answer and was unable to leave a message.   

## 2016-08-10 NOTE — Progress Notes (Signed)
Remote pacemaker transmission.   

## 2016-08-12 ENCOUNTER — Encounter: Payer: Self-pay | Admitting: Cardiology

## 2016-08-12 LAB — CUP PACEART REMOTE DEVICE CHECK
Battery Remaining Longevity: 150 mo
Battery Remaining Percentage: 100 %
Brady Statistic RA Percent Paced: 55 %
Brady Statistic RV Percent Paced: 1 %
Date Time Interrogation Session: 20180611184300
Implantable Lead Implant Date: 20171026
Implantable Lead Implant Date: 20171026
Implantable Lead Location: 753859
Implantable Lead Location: 753860
Implantable Lead Model: 7740
Implantable Lead Model: 7741
Implantable Lead Serial Number: 685941
Implantable Lead Serial Number: 818382
Implantable Pulse Generator Implant Date: 20171026
Lead Channel Impedance Value: 604 Ohm
Lead Channel Impedance Value: 848 Ohm
Lead Channel Pacing Threshold Amplitude: 0.7 V
Lead Channel Pacing Threshold Amplitude: 1 V
Lead Channel Pacing Threshold Pulse Width: 0.4 ms
Lead Channel Pacing Threshold Pulse Width: 0.4 ms
Lead Channel Setting Pacing Amplitude: 3.5 V
Lead Channel Setting Pacing Amplitude: 3.5 V
Lead Channel Setting Pacing Pulse Width: 0.4 ms
Lead Channel Setting Sensing Sensitivity: 2.5 mV
Pulse Gen Serial Number: 766992

## 2016-08-19 ENCOUNTER — Inpatient Hospital Stay (HOSPITAL_COMMUNITY)
Admission: EM | Admit: 2016-08-19 | Discharge: 2016-08-21 | DRG: 291 | Disposition: A | Discharge status: Home Health | Payer: Medicare Other | Attending: Nephrology | Admitting: Nephrology

## 2016-08-19 ENCOUNTER — Encounter (HOSPITAL_COMMUNITY): Payer: Self-pay | Admitting: Family Medicine

## 2016-08-19 ENCOUNTER — Emergency Department (HOSPITAL_COMMUNITY): Payer: Medicare Other

## 2016-08-19 DIAGNOSIS — F17210 Nicotine dependence, cigarettes, uncomplicated: Secondary | ICD-10-CM | POA: Diagnosis present

## 2016-08-19 DIAGNOSIS — J9601 Acute respiratory failure with hypoxia: Secondary | ICD-10-CM | POA: Diagnosis present

## 2016-08-19 DIAGNOSIS — M546 Pain in thoracic spine: Secondary | ICD-10-CM | POA: Diagnosis present

## 2016-08-19 DIAGNOSIS — D696 Thrombocytopenia, unspecified: Secondary | ICD-10-CM | POA: Diagnosis present

## 2016-08-19 DIAGNOSIS — M4854XA Collapsed vertebra, not elsewhere classified, thoracic region, initial encounter for fracture: Secondary | ICD-10-CM | POA: Diagnosis present

## 2016-08-19 DIAGNOSIS — I272 Pulmonary hypertension, unspecified: Secondary | ICD-10-CM | POA: Diagnosis present

## 2016-08-19 DIAGNOSIS — I5033 Acute on chronic diastolic (congestive) heart failure: Secondary | ICD-10-CM

## 2016-08-19 DIAGNOSIS — R0902 Hypoxemia: Secondary | ICD-10-CM | POA: Diagnosis present

## 2016-08-19 DIAGNOSIS — I2510 Atherosclerotic heart disease of native coronary artery without angina pectoris: Secondary | ICD-10-CM | POA: Diagnosis present

## 2016-08-19 DIAGNOSIS — Z66 Do not resuscitate: Secondary | ICD-10-CM | POA: Diagnosis present

## 2016-08-19 DIAGNOSIS — Z7982 Long term (current) use of aspirin: Secondary | ICD-10-CM

## 2016-08-19 DIAGNOSIS — G8929 Other chronic pain: Secondary | ICD-10-CM | POA: Diagnosis present

## 2016-08-19 DIAGNOSIS — J449 Chronic obstructive pulmonary disease, unspecified: Secondary | ICD-10-CM | POA: Diagnosis present

## 2016-08-19 DIAGNOSIS — L409 Psoriasis, unspecified: Secondary | ICD-10-CM | POA: Diagnosis present

## 2016-08-19 DIAGNOSIS — Z79899 Other long term (current) drug therapy: Secondary | ICD-10-CM

## 2016-08-19 DIAGNOSIS — I509 Heart failure, unspecified: Secondary | ICD-10-CM

## 2016-08-19 DIAGNOSIS — D649 Anemia, unspecified: Secondary | ICD-10-CM | POA: Diagnosis present

## 2016-08-19 DIAGNOSIS — Z880 Allergy status to penicillin: Secondary | ICD-10-CM

## 2016-08-19 DIAGNOSIS — Z9114 Patient's other noncompliance with medication regimen: Secondary | ICD-10-CM | POA: Diagnosis not present

## 2016-08-19 DIAGNOSIS — J9600 Acute respiratory failure, unspecified whether with hypoxia or hypercapnia: Secondary | ICD-10-CM | POA: Diagnosis present

## 2016-08-19 DIAGNOSIS — I480 Paroxysmal atrial fibrillation: Secondary | ICD-10-CM | POA: Diagnosis present

## 2016-08-19 DIAGNOSIS — Z951 Presence of aortocoronary bypass graft: Secondary | ICD-10-CM

## 2016-08-19 DIAGNOSIS — I110 Hypertensive heart disease with heart failure: Secondary | ICD-10-CM | POA: Diagnosis present

## 2016-08-19 DIAGNOSIS — S22080A Wedge compression fracture of T11-T12 vertebra, initial encounter for closed fracture: Secondary | ICD-10-CM

## 2016-08-19 HISTORY — DX: Acute on chronic diastolic (congestive) heart failure: I50.33

## 2016-08-19 LAB — CBC WITH DIFFERENTIAL/PLATELET
Basophils Absolute: 0 10*3/uL (ref 0.0–0.1)
Basophils Relative: 0 %
Eosinophils Absolute: 0.3 10*3/uL (ref 0.0–0.7)
Eosinophils Relative: 4 %
HCT: 30.6 % — ABNORMAL LOW (ref 36.0–46.0)
Hemoglobin: 9.8 g/dL — ABNORMAL LOW (ref 12.0–15.0)
Lymphocytes Relative: 17 %
Lymphs Abs: 1.4 10*3/uL (ref 0.7–4.0)
MCH: 29.7 pg (ref 26.0–34.0)
MCHC: 32 g/dL (ref 30.0–36.0)
MCV: 92.7 fL (ref 78.0–100.0)
Monocytes Absolute: 0.9 10*3/uL (ref 0.1–1.0)
Monocytes Relative: 11 %
Neutro Abs: 5.5 10*3/uL (ref 1.7–7.7)
Neutrophils Relative %: 68 %
Platelets: 98 10*3/uL — ABNORMAL LOW (ref 150–400)
RBC: 3.3 MIL/uL — ABNORMAL LOW (ref 3.87–5.11)
RDW: 19.1 % — ABNORMAL HIGH (ref 11.5–15.5)
WBC: 8.1 10*3/uL (ref 4.0–10.5)

## 2016-08-19 LAB — I-STAT TROPONIN, ED: Troponin i, poc: 0 ng/mL (ref 0.00–0.08)

## 2016-08-19 LAB — BASIC METABOLIC PANEL
Anion gap: 7 (ref 5–15)
BUN: 5 mg/dL — ABNORMAL LOW (ref 6–20)
CO2: 28 mmol/L (ref 22–32)
Calcium: 8.9 mg/dL (ref 8.9–10.3)
Chloride: 103 mmol/L (ref 101–111)
Creatinine, Ser: 0.84 mg/dL (ref 0.44–1.00)
GFR calc Af Amer: >60 mL/min (ref >60)
GFR calc non Af Amer: >60 mL/min (ref >60)
Glucose, Bld: 102 mg/dL — ABNORMAL HIGH (ref 65–99)
Potassium: 3.5 mmol/L (ref 3.5–5.1)
Sodium: 138 mmol/L (ref 135–145)

## 2016-08-19 LAB — BRAIN NATRIURETIC PEPTIDE: B Natriuretic Peptide: 1113.7 pg/mL — ABNORMAL HIGH (ref 0.0–100.0)

## 2016-08-19 LAB — TROPONIN I: Troponin I: <0.03 ng/mL (ref <0.03)

## 2016-08-19 MED ORDER — CLOBETASOL PROPIONATE 0.05 % EX CREA
1.0000 "application " | TOPICAL_CREAM | Freq: Two times a day (BID) | CUTANEOUS | Status: DC
  Administered 2016-08-19 – 2016-08-21 (×4): 1 via TOPICAL
  Filled 2016-08-19: qty 15

## 2016-08-19 MED ORDER — ORAL CARE MOUTH RINSE
15.0000 mL | Freq: Two times a day (BID) | OROMUCOSAL | Status: DC
  Administered 2016-08-20 – 2016-08-21 (×3): 15 mL via OROMUCOSAL

## 2016-08-19 MED ORDER — POLYVINYL ALCOHOL 1.4 % OP SOLN
1.0000 [drp] | OPHTHALMIC | Status: DC | PRN
  Filled 2016-08-19: qty 15

## 2016-08-19 MED ORDER — CHLORHEXIDINE GLUCONATE 0.12 % MT SOLN
15.0000 mL | Freq: Two times a day (BID) | OROMUCOSAL | Status: DC
  Administered 2016-08-19 – 2016-08-21 (×4): 15 mL via OROMUCOSAL
  Filled 2016-08-19 (×4): qty 15

## 2016-08-19 MED ORDER — ALBUTEROL SULFATE (2.5 MG/3ML) 0.083% IN NEBU
2.5000 mg | INHALATION_SOLUTION | RESPIRATORY_TRACT | Status: DC | PRN
Start: 2016-08-19 — End: 2016-08-21

## 2016-08-19 MED ORDER — FUROSEMIDE 40 MG PO TABS: 20.0000 mg | ORAL_TABLET | Freq: Every day | ORAL | Status: DC

## 2016-08-19 MED ORDER — ALPRAZOLAM 0.5 MG PO TABS
0.5000 mg | ORAL_TABLET | Freq: Two times a day (BID) | ORAL | Status: DC
  Administered 2016-08-19 – 2016-08-21 (×4): 0.5 mg via ORAL
  Filled 2016-08-19 (×4): qty 1

## 2016-08-19 MED ORDER — ACETAMINOPHEN 325 MG PO TABS: 650.0000 mg | ORAL_TABLET | Freq: Four times a day (QID) | ORAL | Status: DC | PRN

## 2016-08-19 MED ORDER — ONDANSETRON HCL 4 MG/2ML IJ SOLN: 4.0000 mg | Freq: Four times a day (QID) | INTRAMUSCULAR | Status: DC | PRN

## 2016-08-19 MED ORDER — SODIUM CHLORIDE 0.9 % IV SOLN: 250.0000 mL | INTRAVENOUS | Status: DC | PRN

## 2016-08-19 MED ORDER — DOCUSATE SODIUM 100 MG PO CAPS
100.0000 mg | ORAL_CAPSULE | Freq: Two times a day (BID) | ORAL | Status: DC
  Administered 2016-08-19 – 2016-08-21 (×3): 100 mg via ORAL
  Filled 2016-08-19 (×3): qty 1

## 2016-08-19 MED ORDER — METOPROLOL TARTRATE 25 MG PO TABS
25.0000 mg | ORAL_TABLET | Freq: Two times a day (BID) | ORAL | Status: DC
  Administered 2016-08-19 – 2016-08-21 (×4): 25 mg via ORAL
  Filled 2016-08-19 (×4): qty 1

## 2016-08-19 MED ORDER — FLUTICASONE FUROATE-VILANTEROL 200-25 MCG/INH IN AEPB
1.0000 | INHALATION_SPRAY | Freq: Every day | RESPIRATORY_TRACT | Status: DC
  Administered 2016-08-20 – 2016-08-21 (×2): 1 via RESPIRATORY_TRACT
  Filled 2016-08-19: qty 28

## 2016-08-19 MED ORDER — FERROUS SULFATE 325 (65 FE) MG PO TABS
325.0000 mg | ORAL_TABLET | Freq: Every day | ORAL | Status: DC
  Administered 2016-08-20 – 2016-08-21 (×2): 325 mg via ORAL
  Filled 2016-08-19 (×2): qty 1

## 2016-08-19 MED ORDER — FUROSEMIDE 10 MG/ML IJ SOLN
40.0000 mg | INTRAMUSCULAR | Status: AC
  Administered 2016-08-19: 40 mg via INTRAVENOUS
  Filled 2016-08-19: qty 4

## 2016-08-19 MED ORDER — POLYETHYLENE GLYCOL 3350 17 G PO PACK: 17.0000 g | PACK | Freq: Every day | ORAL | Status: DC | PRN

## 2016-08-19 MED ORDER — ONDANSETRON HCL 4 MG PO TABS: 4.0000 mg | ORAL_TABLET | Freq: Four times a day (QID) | ORAL | Status: DC | PRN

## 2016-08-19 MED ORDER — FAMOTIDINE 20 MG PO TABS
20.0000 mg | ORAL_TABLET | Freq: Two times a day (BID) | ORAL | Status: DC
  Filled 2016-08-19: qty 1

## 2016-08-19 MED ORDER — PANTOPRAZOLE SODIUM 40 MG PO TBEC
40.0000 mg | DELAYED_RELEASE_TABLET | Freq: Every day | ORAL | Status: DC
  Administered 2016-08-20 – 2016-08-21 (×2): 40 mg via ORAL
  Filled 2016-08-19 (×2): qty 1

## 2016-08-19 MED ORDER — ALBUTEROL SULFATE HFA 108 (90 BASE) MCG/ACT IN AERS: 2.0000 | INHALATION_SPRAY | Freq: Four times a day (QID) | RESPIRATORY_TRACT | Status: DC | PRN

## 2016-08-19 MED ORDER — FUROSEMIDE 10 MG/ML IJ SOLN
40.0000 mg | Freq: Two times a day (BID) | INTRAMUSCULAR | Status: DC
  Administered 2016-08-19 – 2016-08-21 (×4): 40 mg via INTRAVENOUS
  Filled 2016-08-19 (×4): qty 4

## 2016-08-19 MED ORDER — SENNA 8.6 MG PO TABS
1.0000 | ORAL_TABLET | Freq: Two times a day (BID) | ORAL | Status: DC
  Administered 2016-08-19 – 2016-08-21 (×3): 8.6 mg via ORAL
  Filled 2016-08-19 (×3): qty 1

## 2016-08-19 MED ORDER — TRAZODONE HCL 50 MG PO TABS: 50.0000 mg | ORAL_TABLET | Freq: Every evening | ORAL | Status: DC | PRN

## 2016-08-19 MED ORDER — ATORVASTATIN CALCIUM 20 MG PO TABS
20.0000 mg | ORAL_TABLET | Freq: Every day | ORAL | Status: DC
  Administered 2016-08-19 – 2016-08-21 (×3): 20 mg via ORAL
  Filled 2016-08-19 (×3): qty 1

## 2016-08-19 MED ORDER — TIZANIDINE HCL 4 MG PO TABS
4.0000 mg | ORAL_TABLET | Freq: Every evening | ORAL | Status: DC | PRN
  Administered 2016-08-19 – 2016-08-20 (×2): 4 mg via ORAL
  Filled 2016-08-19 (×2): qty 1

## 2016-08-19 MED ORDER — POLYETHYL GLYCOL-PROPYL GLYCOL 0.4-0.3 % OP SOLN: 1.0000 [drp] | Freq: Every day | OPHTHALMIC | Status: DC | PRN

## 2016-08-19 MED ORDER — FLUTICASONE PROPIONATE 50 MCG/ACT NA SUSP
2.0000 | Freq: Every day | NASAL | Status: DC
  Administered 2016-08-20 – 2016-08-21 (×2): 2 via NASAL
  Filled 2016-08-19: qty 16

## 2016-08-19 MED ORDER — NITROGLYCERIN 2 % TD OINT
1.0000 [in_us] | TOPICAL_OINTMENT | Freq: Three times a day (TID) | TRANSDERMAL | Status: AC
  Administered 2016-08-19 (×2): 1 [in_us] via TOPICAL
  Filled 2016-08-19: qty 30
  Filled 2016-08-19: qty 1

## 2016-08-19 MED ORDER — SODIUM CHLORIDE 0.9% FLUSH
3.0000 mL | Freq: Two times a day (BID) | INTRAVENOUS | Status: DC
  Administered 2016-08-19 – 2016-08-21 (×4): 3 mL via INTRAVENOUS

## 2016-08-19 MED ORDER — SODIUM CHLORIDE 0.9% FLUSH: 3.0000 mL | INTRAVENOUS | Status: DC | PRN

## 2016-08-19 MED ORDER — HEPARIN SODIUM (PORCINE) 5000 UNIT/ML IJ SOLN
5000.0000 [IU] | Freq: Three times a day (TID) | INTRAMUSCULAR | Status: DC
  Administered 2016-08-19 – 2016-08-21 (×5): 5000 [IU] via SUBCUTANEOUS
  Filled 2016-08-19 (×5): qty 1

## 2016-08-19 MED ORDER — CITALOPRAM HYDROBROMIDE 10 MG PO TABS
20.0000 mg | ORAL_TABLET | Freq: Every day | ORAL | Status: DC
  Administered 2016-08-20: 20 mg via ORAL
  Filled 2016-08-19 (×2): qty 2

## 2016-08-19 MED ORDER — ACETAMINOPHEN 650 MG RE SUPP: 650.0000 mg | Freq: Four times a day (QID) | RECTAL | Status: DC | PRN

## 2016-08-19 MED ORDER — ASPIRIN EC 81 MG PO TBEC
81.0000 mg | DELAYED_RELEASE_TABLET | Freq: Every day | ORAL | Status: DC
  Administered 2016-08-21: 81 mg via ORAL
  Filled 2016-08-19 (×3): qty 1

## 2016-08-19 NOTE — ED Provider Notes (Signed)
[facility]-EMERGENCY DEPT Provider Note   CSN: [id number] Arrival date & time: 08/19/16  1304     History   Chief Complaint No chief complaint on file.   HPI [place] Riedlinger is a 71 y.o. female.  The history is provided by the patient and medical records.    71 year old female with history of anemia, arthritis, asthma, A. fib not on anticoagulation due to GI bleeding, CHF with estimated EF of 55-60%, COPD, depression, hyperlipidemia, hypertension, presenting to the ED with shortness of breath.  Patient states this is been ongoing for a few weeks now but has been progressively worsening. States she is short of breath all the time, but symptoms do seem worse when lying flat to sleep. Reports she has been sleeping propped up on several pillows at night in order to get comfortable. States she has noticed some increased swelling in her feet and lower legs as well. She does report wet sounding cough.  Denies fever or chills. States she made an appointment with her primary care doctor today due to her not feeling well and was sent here due to hypoxia. Patient was 87% on arrival. She does not generally require supplement oxygen. Of note, patient does report she has been taking less Lasix than normal. States when she left the hospital last she was taking 80 mg Lasix daily, but she is only been taking 40 mg as she was not sure if she should continue the higher dose. States she has been having some mild chest pain, mostly is been on the right, but today she has noticed a little on the left. States it is a dull ache. She denies any diaphoresis, nausea, vomiting, numbness, or weakness. Family friend at bedside to support patient has been off her antidepressant for a few days which she thinks may be contributing to some of her symptoms.  Of note, patient also complaining of some mid back pain. States she had a fall in February prior to her hospitalization. States she was seen here in the ED but never had any  x-rays of her back. States she has had some "nagging" back pain since this time. She's not had any numbness or weakness of her extremities. She remains ambulatory.  She requests x-ray.  Past Medical History:  Diagnosis Date  . Anemia   . Arthritis   . Asthma   . Atrial fibrillation with controlled ventricular response (HCC) 11/2009   a. not on anticoagulation due to recurrent GI bleeding while on Eliquis.  . Chronic bronchitis   . Chronic diastolic CHF (congestive heart failure) (HCC)    a. Echo 12/2015: EF 55-60% w/ Grade 3 DD  . Chronic headache   . COPD (chronic obstructive pulmonary disease) (HCC)   . Depression   . Heart attack (HCC)   . Hyperlipidemia   . Hypertension     Patient Active Problem List   Diagnosis Date Noted  . Chronic diastolic CHF (congestive heart failure) (HCC)   . Hypoxemia   . Physical deconditioning   . Dementia with behavioral disturbance   . H/O: GI bleed   . Protein-calorie malnutrition, severe 05/15/2016  . Altered mental status   . Acute renal failure (ARF) (HCC)   . Encounter for central line care   . Hypokalemia 05/11/2016  . Thrombocytopenia (HCC) 12/25/2015  . Acute congestive heart failure (HCC)   . SOB (shortness of breath)   . Dysphagia 12/24/2015  . Tachy-brady syndrome (HCC) 12/24/2015  . Chronic anticoagulation   . Fall   .  Prolonged Q-T interval on ECG   . Anemia due to blood loss, acute 10/27/2015  . Elevated lactic acid level 10/27/2015  . Arterial hypotension   . PAF (paroxysmal atrial fibrillation) (HCC) 10/21/2015  . Severe anemia   . Symptomatic anemia   . Coronary artery disease due to lipid rich plaque 02/07/2014  . Obesity 02/07/2014  . Abdominal aortic ectasia (HCC) 09/06/2013  . Encounter for therapeutic drug monitoring 07/13/2013  . Atrial fibrillation (HCC) 12/20/2012  . Atrial fibrillation with controlled ventricular response (HCC) 11/30/2009  . NICOTINE ADDICTION 04/02/2009  . PARAPSORIASIS 12/15/2008  .  Hyperlipidemia 12/14/2008  . HEART ATTACK 12/14/2008  . HEADACHE, CHRONIC 12/14/2008  . Depression 12/13/2008  . 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 COPD exacerbation (HCC) 12/13/2008  . ARTHRITIS 12/13/2008    Past Surgical History:  Procedure Laterality Date  . CAD-CABG     x7, brief post-op Atrial Fib  . CESAREAN SECTION     x2  . COLONOSCOPY WITH PROPOFOL N/A 10/24/2015   Procedure: COLONOSCOPY WITH PROPOFOL;  Surgeon: Carman ChingJames Edwards, MD;  [facility];  Service: Endoscopy;  Laterality: N/A;  . CORONARY ARTERY BYPASS GRAFT  2011  . EP IMPLANTABLE DEVICE N/A 12/26/2015   Procedure: Pacemaker Implant;  Surgeon: Marinus MawGregg W Taylor, MD;  [facility];  Service: Cardiovascular;  Laterality: N/A;  . ESOPHAGOGASTRODUODENOSCOPY N/A 10/22/2015   Procedure: ESOPHAGOGASTRODUODENOSCOPY (EGD);  Surgeon: Carman ChingJames Edwards, MD;  [facility];  Service: Endoscopy;  Laterality: N/A;  . ESOPHAGOGASTRODUODENOSCOPY N/A 12/24/2015   Procedure: ESOPHAGOGASTRODUODENOSCOPY (EGD);  Surgeon: Carman ChingJames Edwards, MD;  [facility];  Service: Endoscopy;  Laterality: N/A;  . JOINT REPLACEMENT Bilateral   . TONSILLECTOMY    . TOTAL KNEE ARTHROPLASTY      OB History    No data available       Home Medications    Prior to Admission medications   Medication Sig Start Date End Date Taking? Authorizing Provider  albuterol (PROVENTIL HFA;VENTOLIN HFA) 108 (90 Base) MCG/ACT inhaler Inhale 2 puffs into the lungs every 6 (six) hours as needed for wheezing or shortness of breath.    [provider]  atorvastatin (LIPITOR) 20 MG tablet Take 20 mg by mouth daily.    [provider]  budesonide-formoterol (SYMBICORT) 160-4.5 MCG/ACT inhaler Inhale 2 puffs into the lungs 2 (two) times daily. 01/15/14   Storm FriskWright, Patrick E, MD  clobetasol cream (TEMOVATE) 0.05 % Apply 1 application topically 2 (two) times daily. Affected  skin 05/21/16   Vassie LollMadera, Carlos, MD  docusate sodium (COLACE) 100 MG capsule Take 100 mg by mouth 2 (two) times daily.     [provider]  fluticasone (FLONASE) 50 MCG/ACT nasal spray Place 2 sprays into both nostrils daily. 12/21/15   Albertine GratesXu, Fang, MD  furosemide (LASIX) 20 MG tablet Take 1 tablet (20 mg total) by mouth daily. Patient may take on additional one as needed for swelling 05/21/16   Vassie LollMadera, Carlos, MD  metoprolol tartrate (LOPRESSOR) 25 MG tablet Take 1 tablet (25 mg total) by mouth at bedtime. 06/30/16   Jake BatheSkains, Mark C, MD  pantoprazole (PROTONIX) 40 MG tablet Take 40 mg by mouth daily before breakfast.     [provider]  Polyethyl Glycol-Propyl Glycol (SYSTANE ULTRA) 0.4-0.3 % SOLN Place 1 drop into both eyes daily as needed (for dry eyes).     [provider]  polyethylene glycol (MIRALAX / GLYCOLAX) packet Take 17 g by mouth  daily.    [provider]  QUEtiapine (SEROQUEL) 25 MG tablet Take by mouth. 1/2 tablet at 8 pm    [provider]  ranitidine (ZANTAC) 150 MG tablet Take 1 tablet (150 mg total) by mouth at bedtime. 05/21/16   Vassie Loll, MD  traMADol (ULTRAM) 50 MG tablet Take by mouth. Take one tablet every 6 hours as needed for pain    [provider]  Vitamin D, Ergocalciferol, (DRISDOL) 50000 units CAPS capsule Take 1 capsule (50,000 Units total) by mouth every 7 (seven) days. 05/26/16   Vassie Loll, MD    Family History Family History  Problem Relation Age of Onset  . Diabetes Mother   . Hypertension Mother   . Heart disease Mother        before age 19  . Heart disease Father        before age 76  . Hypertension Father   . Heart attack Father   . Lung cancer Maternal Grandfather   . Lung cancer Paternal Grandfather   . Hypertension Daughter   . Stroke Paternal Grandmother     Social History Social History  Substance Use Topics  . Smoking status: Former Smoker    Packs/day: 0.20    Types: Cigarettes    . Smokeless tobacco: Never Used  . Alcohol use No     Allergies   Novocain [procaine] and Penicillins   Review of Systems Review of Systems  Respiratory: Positive for shortness of breath.   All other systems reviewed and are negative.    Physical Exam Updated Vital Signs BP (!) 125/55 (BP Location: Left Arm)   Pulse 61   Temp 98.3 F (36.8 C) (Oral)   Resp 18   Ht 5\' 2"  (1.575 m)   Wt 86.3 kg (190 lb 5 oz)   LMP  (LMP Unknown)   SpO2 (!) 86%   BMI 34.81 kg/m   Physical Exam  Constitutional: She is oriented to person, place, and time. She appears well-developed and well-nourished.  HENT:  Head: Normocephalic and atraumatic.  Mouth/Throat: Oropharynx is clear and moist.  Eyes: Conjunctivae and EOM are normal. Pupils are equal, round, and reactive to light.  Neck: Normal range of motion.  Cardiovascular: Normal rate, regular rhythm and normal heart sounds.   Pulmonary/Chest: Effort normal and breath sounds normal.  O2 sats 95% on 2 L O2, able to speak in full sentences, rales at bases  Abdominal: Soft. Bowel sounds are normal.  Musculoskeletal: Normal range of motion.  2+ pitting edema from ankles to knees bilaterally, there is no overlying erythema, warmth to touch, palpable cord, or significant calf tenderness  Neurological: She is alert and oriented to person, place, and time.  Skin: Skin is warm and dry.  Psychiatric: She has a normal mood and affect.  Nursing note and vitals reviewed.    ED Treatments / Results  Labs (all labs ordered are listed, but only abnormal results are displayed) Labs Reviewed  CBC WITH DIFFERENTIAL/PLATELET - Abnormal; Notable for the following:       Result Value   RBC 3.30 (*)    Hemoglobin 9.8 (*)    HCT 30.6 (*)    RDW 19.1 (*)    Platelets 98 (*)    All other components within normal limits  BASIC METABOLIC PANEL - Abnormal; Notable for the following:    Glucose, Bld 102 (*)    BUN 5 (*)    All other components within  normal limits  BRAIN NATRIURETIC PEPTIDE - Abnormal; Notable for the following:    B Natriuretic Peptide 1,113.7 (*)    All other components within normal limits  I-STAT TROPOININ, ED    EKG  EKG Interpretation None       Radiology Dg Chest 2 View  Result Date: 08/19/2016 CLINICAL DATA:  Fatigue, shortness of breath and abdominal pain, single episode of vomiting, onset of symptoms last week with worsening, upper back pain, no injury, history atrial fibrillation, COPD, MI, hypertension EXAM: CHEST  2 VIEW COMPARISON:  05/17/2016 FINDINGS: LEFT subclavian transvenous pacemaker leads project at RIGHT atrium and RIGHT ventricle. Enlargement of cardiac silhouette post CABG. Calcified tortuous aorta. Pulmonary vascular congestion. Minimal chronic interstitial prominence which may reflect minimal chronic failure. No acute consolidation or pneumothorax. Tiny LEFT pleural effusion. Osseous demineralization. IMPRESSION: Enlargement of cardiac silhouette with pulmonary vascular congestion post CABG and pacemaker with suspect minimal chronic failure. Electronically Signed   By: Ulyses Southward M.D.   On: 08/19/2016 15:04   Dg Thoracic Spine 2 View  Result Date: 08/19/2016 CLINICAL DATA:  Upper back pain EXAM: THORACIC SPINE 2 VIEWS COMPARISON:  CT chest 12/21/2005 FINDINGS: Osseous demineralization. Twelve pairs of ribs. Marked superior endplate compression fracture of T12 vertebral body with 50-60% anterior height loss, new since 12/22/2015. No additional fracture or subluxation. Postsurgical changes of CABG and pacemaker placement. Mitral annular calcification noted. Atherosclerotic calcification aorta. IMPRESSION: Osseous demineralization with superior endplate compression fracture of T12 vertebral body demonstrating 50-60% anterior height loss, new since 12/22/2015. Aortic Atherosclerosis (ICD10-I70.0). Electronically Signed   By: Ulyses Southward M.D.   On: 08/19/2016 15:07    Procedures Procedures  (including critical care time)  Medications Ordered in ED Medications - No data to display   Initial Impression / Assessment and Plan / ED Course  I have reviewed the triage vital signs and the nursing notes.  Pertinent labs & imaging results that were available during my care of the patient were reviewed by me and considered in my medical decision making (see chart for details).  71 year old female here with worsening shortness of breath over the past several weeks. On exam she appears clinically fluid overloaded with Rales at bases, peripheral edema, and new hypoxia to 87% on RA.  sats increased to 95% with 2L supplemental oxygen.  Labs as above, troponin is negative but BNP elevated at 1113.7.  CXR with vascular congestion.  Patient will need admission for IV diuresis given her new hypoxia and oxygen requirement.  Given additional 40mg  IV lasix.  Regarding her back pain after fall several months ago, thoracic spine films do reveal a endplate compression fracture of T12 which is new from October 2017. Etiology of back pain. She remains neurologically intact here. Patient states she already has back brace at home.  As she is not having any acute neurologic deficits, do not feel she needs emergent intervention for this currently.  Discussed with hospitalist service-- they will admit for ongoing care.  Final Clinical Impressions(s) / ED Diagnoses   Final diagnoses:  Acute on chronic congestive heart failure, unspecified heart failure type (HCC)  Hypoxia  Compression fracture of T12 vertebra [facility])    New Prescriptions Current Discharge Medication List       Garlon Hatchet, PA-C 08/19/16 2245    Melene Plan, DO 08/20/16 [phone]

## 2016-08-19 NOTE — ED Notes (Signed)
Pt ambulated to the restroom and back to stretcher. Pt was short of breath during the walk.

## 2016-08-19 NOTE — ED Triage Notes (Addendum)
Patient is from home and complaining of fatigue, shortness of breath, and abd pain with one vomiting episode. Symptoms started last week and got worse. Pt was seen at PCP and referred to ED for further evaluation. Also, reports she has had a weight gain of 20Ibs in about 2 weeks.

## 2016-08-19 NOTE — ED Notes (Signed)
Will give bedside report.  

## 2016-08-19 NOTE — H&P (Signed)
Patient Demographics:    Gabrielle SchwartzVirginia [name], is a 71 y.o. female  MRN: [id number]   DOB - 1945-09-13  Admit Date - 08/19/2016  Outpatient Primary MD for the patient is Henrine Screwshacker, Robert, MD   Assessment & Plan:    Principal Problem:   Acute on chronic diastolic CHF (congestive heart failure) /EF 55 % Active Problems:   Hypertensive heart disease with CHF (congestive heart failure) (HCC)   PAF (paroxysmal atrial fibrillation) (HCC)   Hypoxic Respiratory failure, acute (HCC)   CHF exacerbation (HCC)    1)HFpEF- Acute and chronic diastolic CHF exacerbation- treat empirically with IV Lasix, monitor output and input closely daily weight, topical nitroglycerin, serial troponins to rule out ACS, last known EF 55% based on echocardiogram from 05/17/2016, patient had grade 3 diastolic dysfunction with moderate MR and moderate TR as well as moderate pulmonary hypertension at the time. Supplemental oxygen as advised  2)Acute Hypoxic Respiratory Failure-secondary to #1 above, supplemental oxygen and treat CHF as above in #1  3)PAF- patient had significant GI bleed previously on anticoagulation requiring transfusion, Continue aspirin for stroke prophylaxis, full coagulation contraindicated due to prior Gi blood, give metoprolol 20 mm twice a day for rate control  4)H/o COPD- abstinence from tobacco advised, no acute exacerbation at this time, hold off on steroids, give supplemental oxygen and bronchodilators  5)H/o CAD- s/p Prior CABG (10 yrs ago)- no acute ACS type symptoms at this time, continue aspirin, Lipitor and metoprolol, serial troponins as above in #1  With History of - Reviewed by me  Past Medical History:  Diagnosis Date  . Anemia   . Arthritis   . Asthma   . Atrial fibrillation with controlled ventricular  response (HCC) 11/2009   a. not on anticoagulation due to recurrent GI bleeding while on Eliquis.  . Chronic bronchitis   . Chronic diastolic CHF (congestive heart failure) (HCC)    a. Echo 12/2015: EF 55-60% w/ Grade 3 DD  . Chronic headache   . COPD (chronic obstructive pulmonary disease) (HCC)   . Depression   . Heart attack (HCC)   . Hyperlipidemia   . Hypertension       Past Surgical History:  Procedure Laterality Date  . CAD-CABG     x7, brief post-op Atrial Fib  . CESAREAN SECTION     x2  . COLONOSCOPY WITH PROPOFOL N/A 10/24/2015   Procedure: COLONOSCOPY WITH PROPOFOL;  Surgeon: Carman ChingJames Edwards, MD;  [facility];  Service: Endoscopy;  Laterality: N/A;  . CORONARY ARTERY BYPASS GRAFT  2011  . EP IMPLANTABLE DEVICE N/A 12/26/2015   Procedure: Pacemaker Implant;  Surgeon: Marinus MawGregg W Taylor, MD;  [facility];  Service: Cardiovascular;  Laterality: N/A;  . ESOPHAGOGASTRODUODENOSCOPY N/A 10/22/2015   Procedure: ESOPHAGOGASTRODUODENOSCOPY (EGD);  Surgeon: Carman ChingJames Edwards, MD;  [facility];  Service: Endoscopy;  Laterality: N/A;  . ESOPHAGOGASTRODUODENOSCOPY N/A 12/24/2015   Procedure: ESOPHAGOGASTRODUODENOSCOPY (EGD);  Surgeon: Carman ChingJames Edwards,  MD;  Location: [facility] ENDOSCOPY;  Service: Endoscopy;  Laterality: N/A;  . JOINT REPLACEMENT Bilateral   . TONSILLECTOMY    . TOTAL KNEE ARTHROPLASTY        Chief Complaint  Patient presents with  . Shortness of Breath      HPI:    Gabrielle [name]  is a 71 y.o. female, With past medical history relevant for chronic diastolic CHF, atrial fibrillation and pulmonary hypertension who presents to the ED with worsening shortness of breath. Patient states that she was taking less Lasix than prescribed. At this time patient denies frank left-sided chest pains, no pleuritic symptoms. She apparently had right-sided chest discomfort last couple days but not today. No vomiting or diarrhea Patient had a fall a few months  ago and been having increasing back pain as well. In ED x-rays shows T12 fracture.   In ED patient is found to have O2 sats of 87% on room air with increased work of breathing with minimal manipulation to the bedside commode. Patient was not previously on home oxygen  In ED patient received Lasix 40 mg IV 1 and voided    Review of systems:    In addition to the HPI above,   A full 12 point Review of 10 Systems was done, except as stated above, all other Review of 10 Systems were negative.    Social History:  Reviewed by me    Social History  Substance Use Topics  . Smoking status: Current Some Day Smoker    Packs/day: 0.20    Types: Cigarettes  . Smokeless tobacco: Never Used     Comment: "Occasionally I will smoke a cigarette"   . Alcohol use No     Family History :  Reviewed by me  Family History  Problem Relation Age of Onset  . Diabetes Mother   . Hypertension Mother   . Heart disease Mother        before age 22  . Heart disease Father        before age 71  . Hypertension Father   . Heart attack Father   . Lung cancer Maternal Grandfather   . Lung cancer Paternal Grandfather   . Hypertension Daughter   . Stroke Paternal Grandmother     Home Medications:   Prior to Admission medications   Medication Sig Start Date End Date Taking? Authorizing Provider  albuterol (PROVENTIL HFA;VENTOLIN HFA) 108 (90 Base) MCG/ACT inhaler Inhale 2 puffs into the lungs every 6 (six) hours as needed for wheezing or shortness of breath.   Yes [provider]  ALPRAZolam Prudy Feeler) 0.5 MG tablet Take 0.5 mg by mouth 2 (two) times daily. 07/24/16  Yes [provider]  atorvastatin (LIPITOR) 20 MG tablet Take 20 mg by mouth daily.   Yes [provider]  budesonide-formoterol (SYMBICORT) 160-4.5 MCG/ACT inhaler Inhale 2 puffs into the lungs 2 (two) times daily. 01/15/14  Yes Storm Frisk, MD  citalopram (CELEXA) 20 MG tablet Take 20 mg by mouth daily.  07/08/16  Yes [provider]  clobetasol cream (TEMOVATE) 0.05 % Apply 1 application topically 2 (two) times daily. Affected skin 05/21/16  Yes Vassie Loll, MD  docusate sodium (COLACE) 100 MG capsule Take 100 mg by mouth 2 (two) times daily.    Yes [provider]  ferrous sulfate 325 (65 FE) MG tablet Take 325 mg by mouth daily. 07/08/16  Yes [provider]  fluticasone (FLONASE) 50 MCG/ACT nasal spray Place 2 sprays  into both nostrils daily. 12/21/15  Yes Albertine Grates, MD  furosemide (LASIX) 20 MG tablet Take 1 tablet (20 mg total) by mouth daily. Patient may take on additional one as needed for swelling 05/21/16  Yes Vassie Loll, MD  metoprolol tartrate (LOPRESSOR) 25 MG tablet Take 1 tablet (25 mg total) by mouth at bedtime. 06/30/16  Yes Jake Bathe, MD  pantoprazole (PROTONIX) 40 MG tablet Take 40 mg by mouth daily before breakfast.    Yes [provider]  Polyethyl Glycol-Propyl Glycol (SYSTANE ULTRA) 0.4-0.3 % SOLN Place 1 drop into both eyes daily as needed (for dry eyes).    Yes [provider]  ranitidine (ZANTAC) 150 MG tablet Take 1 tablet (150 mg total) by mouth at bedtime. 05/21/16  Yes Vassie Loll, MD  tiZANidine (ZANAFLEX) 4 MG tablet Take 4 mg by mouth at bedtime as needed for muscle spasms. 07/12/16  Yes [provider]  traMADol (ULTRAM) 50 MG tablet Take by mouth. Take one tablet every 6 hours as needed for pain   Yes [provider]  Vitamin D, Ergocalciferol, (DRISDOL) 50000 units CAPS capsule Take 1 capsule (50,000 Units total) by mouth every 7 (seven) days. Patient not taking: Reported on 08/19/2016 05/26/16   Vassie Loll, MD     Allergies:     Allergies  Allergen Reactions  . Novocain [Procaine] Hives and Palpitations  . Penicillins Hives and Other (See Comments)    Has patient had a PCN reaction causing immediate rash, facial/tongue/throat swelling, SOB or lightheadedness with hypotension: No Has  patient had a PCN reaction causing severe rash involving mucus membranes or skin necrosis: No Has patient had a PCN reaction that required hospitalization No Has patient had a PCN reaction occurring within the last 10 years: No If all of the above answers are "NO", then may proceed with Cephalosporin use.     Physical Exam:   Vitals  Blood pressure (!) 155/61, pulse 72, temperature 97.7 F (36.5 C), temperature source Oral, resp. rate 20, height 5\' 2"  (1.575 m), weight 86.2 kg (190 lb), SpO2 100 %.  Physical Examination: General appearance - alert, Able to speak in short sentences Mental status - alert, oriented to person, place, and time,  Nose- [place] 2 L/min Eyes - sclera anicteric Neck - supple, no JVD elevation , Chest -diminished in bases with faint bibasilar rales Heart - S1 and S2 normal, irregular, CABG scar Abdomen - soft, nontender, nondistended, no masses or organomegaly Neurological - screening mental status exam normal, neck supple without rigidity, cranial nerves II through XII intact, DTR's normal and symmetric Extremities -trace to +1 pitting pedal edema noted, intact peripheral pulses  Skin - warm, dry, excoriations on both upper and lower extremities    Data Review:    CBC  Recent Labs Lab 08/19/16 1415  WBC 8.1  HGB 9.8*  HCT 30.6*  PLT 98*  MCV 92.7  MCH 29.7  MCHC 32.0  RDW 19.1*  LYMPHSABS 1.4  MONOABS 0.9  EOSABS 0.3  BASOSABS 0.0   ------------------------------------------------------------------------------------------------------------------  Chemistries   Recent Labs Lab 08/19/16 1415  NA 138  K 3.5  CL 103  CO2 28  GLUCOSE 102*  BUN 5*  CREATININE 0.84  CALCIUM 8.9   ------------------------------------------------------------------------------------------------------------------ estimated creatinine clearance is 61.6 mL/min (by C-G formula based on SCr of 0.84  mg/dL). ------------------------------------------------------------------------------------------------------------------ No results for input(s): TSH, T4TOTAL, T3FREE, THYROIDAB in the last 72 hours.  Invalid input(s): FREET3   Coagulation profile No results for  input(s): INR, PROTIME in the last 168 hours. ------------------------------------------------------------------------------------------------------------------- No results for input(s): DDIMER in the last 72 hours. -------------------------------------------------------------------------------------------------------------------  Cardiac Enzymes No results for input(s): CKMB, TROPONINI, MYOGLOBIN in the last 168 hours.  Invalid input(s): CK ------------------------------------------------------------------------------------------------------------------    Component Value Date/Time   BNP 1,113.7 (H) 08/19/2016 1415     ---------------------------------------------------------------------------------------------------------------  Urinalysis    Component Value Date/Time   COLORURINE YELLOW 05/11/2016 2000   APPEARANCEUR HAZY (A) 05/11/2016 2000   LABSPEC 1.017 05/11/2016 2000   PHURINE 5.0 05/11/2016 2000   GLUCOSEU NEGATIVE 05/11/2016 2000   HGBUR LARGE (A) 05/11/2016 2000   BILIRUBINUR NEGATIVE 05/11/2016 2000   KETONESUR NEGATIVE 05/11/2016 2000   PROTEINUR NEGATIVE 05/11/2016 2000   UROBILINOGEN 1.0 12/02/2010 1023   NITRITE NEGATIVE 05/11/2016 2000   LEUKOCYTESUR TRACE (A) 05/11/2016 2000    ----------------------------------------------------------------------------------------------------------------   Imaging Results:    Dg Chest 2 View  Result Date: 08/19/2016 CLINICAL DATA:  Fatigue, shortness of breath and abdominal pain, single episode of vomiting, onset of symptoms last week with worsening, upper back pain, no injury, history atrial fibrillation, COPD, MI, hypertension EXAM: CHEST  2 VIEW  COMPARISON:  05/17/2016 FINDINGS: LEFT subclavian transvenous pacemaker leads project at RIGHT atrium and RIGHT ventricle. Enlargement of cardiac silhouette post CABG. Calcified tortuous aorta. Pulmonary vascular congestion. Minimal chronic interstitial prominence which may reflect minimal chronic failure. No acute consolidation or pneumothorax. Tiny LEFT pleural effusion. Osseous demineralization. IMPRESSION: Enlargement of cardiac silhouette with pulmonary vascular congestion post CABG and pacemaker with suspect minimal chronic failure. Electronically Signed   By: Ulyses Southward M.D.   On: 08/19/2016 15:04   Dg Thoracic Spine 2 View  Result Date: 08/19/2016 CLINICAL DATA:  Upper back pain EXAM: THORACIC SPINE 2 VIEWS COMPARISON:  CT chest 12/21/2005 FINDINGS: Osseous demineralization. Twelve pairs of ribs. Marked superior endplate compression fracture of T12 vertebral body with 50-60% anterior height loss, new since 12/22/2015. No additional fracture or subluxation. Postsurgical changes of CABG and pacemaker placement. Mitral annular calcification noted. Atherosclerotic calcification aorta. IMPRESSION: Osseous demineralization with superior endplate compression fracture of T12 vertebral body demonstrating 50-60% anterior height loss, new since 12/22/2015. Aortic Atherosclerosis (ICD10-I70.0). Electronically Signed   By: Ulyses Southward M.D.   On: 08/19/2016 15:07    Radiological Exams on Admission: Dg Chest 2 View  Result Date: 08/19/2016 CLINICAL DATA:  Fatigue, shortness of breath and abdominal pain, single episode of vomiting, onset of symptoms last week with worsening, upper back pain, no injury, history atrial fibrillation, COPD, MI, hypertension EXAM: CHEST  2 VIEW COMPARISON:  05/17/2016 FINDINGS: LEFT subclavian transvenous pacemaker leads project at RIGHT atrium and RIGHT ventricle. Enlargement of cardiac silhouette post CABG. Calcified tortuous aorta. Pulmonary vascular congestion. Minimal chronic  interstitial prominence which may reflect minimal chronic failure. No acute consolidation or pneumothorax. Tiny LEFT pleural effusion. Osseous demineralization. IMPRESSION: Enlargement of cardiac silhouette with pulmonary vascular congestion post CABG and pacemaker with suspect minimal chronic failure. Electronically Signed   By: Ulyses Southward M.D.   On: 08/19/2016 15:04   Dg Thoracic Spine 2 View  Result Date: 08/19/2016 CLINICAL DATA:  Upper back pain EXAM: THORACIC SPINE 2 VIEWS COMPARISON:  CT chest 12/21/2005 FINDINGS: Osseous demineralization. Twelve pairs of ribs. Marked superior endplate compression fracture of T12 vertebral body with 50-60% anterior height loss, new since 12/22/2015. No additional fracture or subluxation. Postsurgical changes of CABG and pacemaker placement. Mitral annular calcification noted. Atherosclerotic calcification aorta. IMPRESSION: Osseous demineralization with superior endplate compression fracture of T12  vertebral body demonstrating 50-60% anterior height loss, new since 12/22/2015. Aortic Atherosclerosis (ICD10-I70.0). Electronically Signed   By: Ulyses Southward M.D.   On: 08/19/2016 15:07    DVT Prophylaxis -SCD   AM Labs Ordered, also please review Full Orders  Family Communication: Admission, patients condition and plan of care including tests being ordered have been discussed with the patient who indicate understanding and agree with the plan   Code Status - DNR  Likely DC to  home  Condition   stable  Cainen Burnham M.D on 08/19/2016 at 5:46 PM   Between 7am to 7pm - Pager - [phone]  After 7pm go to [email or web address] - password [email or web address]  [facility] - Office  [phone]  Voice Recognition Reubin Milan dictation system was used to create this note, attempts have been made to correct errors. Please contact the author with questions and/or clarifications.

## 2016-08-19 NOTE — ED Notes (Signed)
Attempted an IV x 2 with being unsuccessful.  

## 2016-08-20 ENCOUNTER — Encounter: Payer: Self-pay | Admitting: Internal Medicine

## 2016-08-20 DIAGNOSIS — I110 Hypertensive heart disease with heart failure: Secondary | ICD-10-CM | POA: Diagnosis not present

## 2016-08-20 DIAGNOSIS — R0902 Hypoxemia: Secondary | ICD-10-CM | POA: Diagnosis not present

## 2016-08-20 DIAGNOSIS — I5033 Acute on chronic diastolic (congestive) heart failure: Secondary | ICD-10-CM

## 2016-08-20 DIAGNOSIS — I480 Paroxysmal atrial fibrillation: Secondary | ICD-10-CM

## 2016-08-20 DIAGNOSIS — S22080A Wedge compression fracture of T11-T12 vertebra, initial encounter for closed fracture: Secondary | ICD-10-CM | POA: Diagnosis not present

## 2016-08-20 LAB — BASIC METABOLIC PANEL
Anion gap: 9 (ref 5–15)
BUN: 8 mg/dL (ref 6–20)
CO2: 30 mmol/L (ref 22–32)
Calcium: 8.7 mg/dL — ABNORMAL LOW (ref 8.9–10.3)
Chloride: 103 mmol/L (ref 101–111)
Creatinine, Ser: 0.99 mg/dL (ref 0.44–1.00)
GFR calc Af Amer: >60 mL/min (ref >60)
GFR calc non Af Amer: 56 mL/min — ABNORMAL LOW (ref >60)
Glucose, Bld: 144 mg/dL — ABNORMAL HIGH (ref 65–99)
Potassium: 3.2 mmol/L — ABNORMAL LOW (ref 3.5–5.1)
Sodium: 142 mmol/L (ref 135–145)

## 2016-08-20 LAB — CBC
HCT: 30 % — ABNORMAL LOW (ref 36.0–46.0)
Hemoglobin: 9.6 g/dL — ABNORMAL LOW (ref 12.0–15.0)
MCH: 29.5 pg (ref 26.0–34.0)
MCHC: 32 g/dL (ref 30.0–36.0)
MCV: 92.3 fL (ref 78.0–100.0)
Platelets: 104 10*3/uL — ABNORMAL LOW (ref 150–400)
RBC: 3.25 MIL/uL — ABNORMAL LOW (ref 3.87–5.11)
RDW: 19.4 % — ABNORMAL HIGH (ref 11.5–15.5)
WBC: 7.2 10*3/uL (ref 4.0–10.5)

## 2016-08-20 LAB — MAGNESIUM: Magnesium: 1.5 mg/dL — ABNORMAL LOW (ref 1.7–2.4)

## 2016-08-20 LAB — TROPONIN I: Troponin I: <0.03 ng/mL (ref <0.03)

## 2016-08-20 LAB — MRSA PCR SCREENING: MRSA by PCR: NEGATIVE

## 2016-08-20 MED ORDER — ALUM & MAG HYDROXIDE-SIMETH 200-200-20 MG/5ML PO SUSP
30.0000 mL | ORAL | Status: DC | PRN
  Administered 2016-08-20: 30 mL via ORAL
  Filled 2016-08-20: qty 30

## 2016-08-20 MED ORDER — MAGNESIUM SULFATE 2 GM/50ML IV SOLN
2.0000 g | Freq: Once | INTRAVENOUS | Status: AC
  Administered 2016-08-20: 2 g via INTRAVENOUS
  Filled 2016-08-20: qty 50

## 2016-08-20 MED ORDER — OXYCODONE-ACETAMINOPHEN 5-325 MG PO TABS
1.0000 | ORAL_TABLET | ORAL | Status: DC | PRN
  Administered 2016-08-20 – 2016-08-21 (×2): 1 via ORAL
  Filled 2016-08-20 (×2): qty 1

## 2016-08-20 MED ORDER — POTASSIUM CHLORIDE CRYS ER 20 MEQ PO TBCR
40.0000 meq | EXTENDED_RELEASE_TABLET | Freq: Two times a day (BID) | ORAL | Status: DC
  Administered 2016-08-20 – 2016-08-21 (×3): 40 meq via ORAL
  Filled 2016-08-20 (×3): qty 2

## 2016-08-20 NOTE — Progress Notes (Signed)
PROGRESS NOTE [facility]   [place] W [place]   BJY:[id number]:[id number] DOB: 10/30/1945  DOA: 08/19/2016 PCP: Henrine Screwshacker, Robert, MD   Brief Narrative:  71 y.o. Female with significant Hx of chronic diastolic CHF, A. Fib., and Pulmonary HTN, presented to the ED with SOB. Patient admits she was taking less Lasix than prescribed, it sounds like she had her dosages confused and was only taking 1 pill a day when she was prescribed 4.  She also recently realized how much she enjoys drinking gatorade which she has been drinking daily. Patient admitted mild chest pain prior to admission, but has not had any since being in the hospital. Patient fell a few months ago and has still had back pain, ED xray showed T12 fracture. ED O2 stats 87% on room air. Patient is still having some Sob, but has slightly improved since admission. BNP 1,113.7.    Assessment & Plan:   Principal Problem:   Acute on chronic diastolic CHF (congestive heart failure) /EF 55 % Active Problems:   Hypertensive heart disease with CHF (congestive heart failure) (HCC)   PAF (paroxysmal atrial fibrillation) (HCC)   Hypoxic Respiratory failure, acute (HCC)   CHF exacerbation (HCC)  Acute on chronic diastolic CHF: Continue IV Lasix. Monitor input/output and daily weights. Troponins negative. Last EF 55% on echo (05/17/16). Wean O2, currently on 2L via [place]. Check BMP and Mag.  Acute Hypoxic Respiratory failure secondary to CHF: Controlled. Wean O2 and treat as above.  Paroxysmal A. Fib.: Full prophylaxis contraindicated because of previous GI bleed. Continue Aspirin and Metoprolol.  COPD: No current exacerbation. Hold steroids. Wean O2. Continue bronchodilators.  CAD: CABG 10 years ago. No ACS symptoms at this time. Troponins negative. Continue aspirin, lipitor, metoprolol.   Psoriasis: Continue Temovate.  DVT prophylaxis: SCD's Code Status: DNR Family Communication: None at bed side Disposition Plan: 24-48 hr depending on  response to Lasix   Consultants:   None  Procedures:  None  Antimicrobials:  None  Subjective: Patient seen and examined. Pt states she came to the hospital because of SOB, psoriasis, and back pain. Patient feels SOB is slightly better today. She is currently on 2L O2 via [place]. Patient admits she messed up her home dose of lasix and has recently increased consumption of gatorade.   Objective: Vitals:   08/19/16 1800 08/19/16 1833 08/19/16 2014 08/20/16 0507  BP: (!) 162/78 127/68 138/74 (!) 102/54  Pulse: 65 63 (!) 115 64  Resp: (!) 22 20 20 18   Temp:  97.6 F (36.4 C) 97.6 F (36.4 C) 98.4 F (36.9 C)  TempSrc:  Oral Oral Oral  SpO2: (!) 88% 100% 99% 99%  Weight:    180 lb 6.4 oz (81.8 kg)  Height:  5' 2.5" (1.588 m)      Intake/Output Summary (Last 24 hours) at 08/20/16 0944 Last data filed at 08/20/16 0600  Gross per 24 hour  Intake                0 ml  Output             3725 ml  Net            -3725 ml   Filed Weights   08/19/16 1322 08/19/16 1343 08/20/16 0507  Weight: 190 lb 5 oz (86.3 kg) 190 lb (86.2 kg) 180 lb 6.4 oz (81.8 kg)    Examination:  General exam: Appears fatigued and in no acute distress Respiratory system: Crackles bilaterally at bases Cardiovascular system:  S1 & S2 heard. Central nervous system: Alert and oriented. No focal neurological deficits. Extremities: mild edema Skin: psoriasis on upper and lower extremities Psychiatry: Judgement and insight appear normal. Mood & affect appropriate.    Data Reviewed: I have personally reviewed following labs and imaging studies  CBC:  Recent Labs Lab 08/19/16 1415  WBC 8.1  NEUTROABS 5.5  HGB 9.8*  HCT 30.6*  MCV 92.7  PLT 98*   Basic Metabolic Panel:  Recent Labs Lab 08/19/16 1415  NA 138  K 3.5  CL 103  CO2 28  GLUCOSE 102*  BUN 5*  CREATININE 0.84  CALCIUM 8.9   GFR: Estimated Creatinine Clearance: 60.7 mL/min (by C-G formula based on SCr of 0.84 mg/dL). Liver  Function Tests: No results for input(s): AST, ALT, ALKPHOS, BILITOT, PROT, ALBUMIN in the last 168 hours. No results for input(s): LIPASE, AMYLASE in the last 168 hours. No results for input(s): AMMONIA in the last 168 hours. Coagulation Profile: No results for input(s): INR, PROTIME in the last 168 hours. Cardiac Enzymes:  Recent Labs Lab 08/19/16 2208 08/20/16 0606  TROPONINI <0.03 <0.03   BNP (last 3 results) No results for input(s): PROBNP in the last 8760 hours. HbA1C: No results for input(s): HGBA1C in the last 72 hours. CBG: No results for input(s): GLUCAP in the last 168 hours. Lipid Profile: No results for input(s): CHOL, HDL, LDLCALC, TRIG, CHOLHDL, LDLDIRECT in the last 72 hours. Thyroid Function Tests: No results for input(s): TSH, T4TOTAL, FREET4, T3FREE, THYROIDAB in the last 72 hours. Anemia Panel: No results for input(s): VITAMINB12, FOLATE, FERRITIN, TIBC, IRON, RETICCTPCT in the last 72 hours. Sepsis Labs: No results for input(s): PROCALCITON, LATICACIDVEN in the last 168 hours.  No results found for this or any previous visit (from the past 240 hour(s)).       Radiology Studies: Dg Chest 2 View  Result Date: 08/19/2016 CLINICAL DATA:  Fatigue, shortness of breath and abdominal pain, single episode of vomiting, onset of symptoms last week with worsening, upper back pain, no injury, history atrial fibrillation, COPD, MI, hypertension EXAM: CHEST  2 VIEW COMPARISON:  05/17/2016 FINDINGS: LEFT subclavian transvenous pacemaker leads project at RIGHT atrium and RIGHT ventricle. Enlargement of cardiac silhouette post CABG. Calcified tortuous aorta. Pulmonary vascular congestion. Minimal chronic interstitial prominence which may reflect minimal chronic failure. No acute consolidation or pneumothorax. Tiny LEFT pleural effusion. Osseous demineralization. IMPRESSION: Enlargement of cardiac silhouette with pulmonary vascular congestion post CABG and pacemaker with  suspect minimal chronic failure. Electronically Signed   By: Ulyses Southward M.D.   On: 08/19/2016 15:04   Dg Thoracic Spine 2 View  Result Date: 08/19/2016 CLINICAL DATA:  Upper back pain EXAM: THORACIC SPINE 2 VIEWS COMPARISON:  CT chest 12/21/2005 FINDINGS: Osseous demineralization. Twelve pairs of ribs. Marked superior endplate compression fracture of T12 vertebral body with 50-60% anterior height loss, new since 12/22/2015. No additional fracture or subluxation. Postsurgical changes of CABG and pacemaker placement. Mitral annular calcification noted. Atherosclerotic calcification aorta. IMPRESSION: Osseous demineralization with superior endplate compression fracture of T12 vertebral body demonstrating 50-60% anterior height loss, new since 12/22/2015. Aortic Atherosclerosis (ICD10-I70.0). Electronically Signed   By: Ulyses Southward M.D.   On: 08/19/2016 15:07      Scheduled Meds: . ALPRAZolam  0.5 mg Oral BID  . aspirin EC  81 mg Oral Daily  . atorvastatin  20 mg Oral Daily  . chlorhexidine  15 mL Mouth Rinse BID  . citalopram  20 mg Oral Daily  .  clobetasol cream  1 application Topical BID  . docusate sodium  100 mg Oral BID  . ferrous sulfate  325 mg Oral Daily  . fluticasone  2 spray Each Nare Daily  . fluticasone furoate-vilanterol  1 puff Inhalation Daily  . furosemide  40 mg Intravenous Q12H  . heparin  5,000 Units Subcutaneous Q8H  . mouth rinse  15 mL Mouth Rinse q12n4p  . metoprolol tartrate  25 mg Oral BID  . pantoprazole  40 mg Oral QAC breakfast  . senna  1 tablet Oral BID  . sodium chloride flush  3 mL Intravenous Q12H   Continuous Infusions: . sodium chloride       LOS: 1 day     Damier Disano, Pa-s  If 7PM-7AM, please contact night-coverage [email or web address] Password [facility] 08/20/2016, 9:44 AM

## 2016-08-20 NOTE — Progress Notes (Signed)
SATURATION QUALIFICATIONS: (This note is used to comply with regulatory documentation for home oxygen)  Patient Saturations on Room Air at Rest = 92%  Patient Saturations on Room Air while Ambulating = 90-93%

## 2016-08-21 DIAGNOSIS — I480 Paroxysmal atrial fibrillation: Secondary | ICD-10-CM | POA: Diagnosis not present

## 2016-08-21 DIAGNOSIS — I5033 Acute on chronic diastolic (congestive) heart failure: Secondary | ICD-10-CM | POA: Diagnosis not present

## 2016-08-21 DIAGNOSIS — S22080A Wedge compression fracture of T11-T12 vertebra, initial encounter for closed fracture: Secondary | ICD-10-CM | POA: Diagnosis not present

## 2016-08-21 DIAGNOSIS — I110 Hypertensive heart disease with heart failure: Secondary | ICD-10-CM | POA: Diagnosis not present

## 2016-08-21 DIAGNOSIS — R0902 Hypoxemia: Secondary | ICD-10-CM | POA: Diagnosis not present

## 2016-08-21 LAB — BASIC METABOLIC PANEL
Anion gap: 6 (ref 5–15)
BUN: 11 mg/dL (ref 6–20)
CO2: 32 mmol/L (ref 22–32)
Calcium: 9 mg/dL (ref 8.9–10.3)
Chloride: 100 mmol/L — ABNORMAL LOW (ref 101–111)
Creatinine, Ser: 1.01 mg/dL — ABNORMAL HIGH (ref 0.44–1.00)
GFR calc Af Amer: >60 mL/min (ref >60)
GFR calc non Af Amer: 54 mL/min — ABNORMAL LOW (ref >60)
Glucose, Bld: 87 mg/dL (ref 65–99)
Potassium: 4.2 mmol/L (ref 3.5–5.1)
Sodium: 138 mmol/L (ref 135–145)

## 2016-08-21 LAB — MAGNESIUM: Magnesium: 1.9 mg/dL (ref 1.7–2.4)

## 2016-08-21 MED ORDER — METOPROLOL TARTRATE 25 MG PO TABS: 25.0000 mg | ORAL_TABLET | Freq: Every day | ORAL | Status: DC

## 2016-08-21 MED ORDER — POTASSIUM CHLORIDE CRYS ER 20 MEQ PO TBCR: 20.0000 meq | EXTENDED_RELEASE_TABLET | Freq: Every day | ORAL | 0 refills | Status: DC

## 2016-08-21 MED ORDER — ASPIRIN 81 MG PO TBEC: 81.0000 mg | DELAYED_RELEASE_TABLET | Freq: Every day | ORAL | 0 refills | Status: DC

## 2016-08-21 MED ORDER — FUROSEMIDE 40 MG PO TABS
40.0000 mg | ORAL_TABLET | Freq: Two times a day (BID) | ORAL | 0 refills | Status: DC
Start: 2016-08-21 — End: 2016-09-28

## 2016-08-21 MED ORDER — TIZANIDINE HCL 4 MG PO TABS: 4.0000 mg | ORAL_TABLET | Freq: Two times a day (BID) | ORAL | Status: DC | PRN

## 2016-08-21 NOTE — Care Management Note (Signed)
Case Management Note  Patient Details  Name: Gabrielle [name] MRN: [id number] Date of Birth: [birth year]  Subjective/Objective:  PT recc [facility]HC-[facility] chosen-[facility] rep Selena BattenKim aware of orders, & possible d/c today.   Also provided patient w/private duty care agency list as resource for custodial level care(independent decision, out of pocket cost).               Action/Plan:d/c home w/HHC.   Expected Discharge Date:   (unknown)               Expected Discharge Plan:  Home w Home Health Services  In-House Referral:     Discharge planning Services  CM Consult  Post Acute Care Choice:  Durable Medical Equipment (Has cane, rw) Choice offered to:  Patient  DME Arranged:    DME Agency:     HH Arranged:  PT, Nurse's Aide HH Agency:     Status of Service:  Completed, signed off  If discussed at Long Length of Stay Meetings, dates discussed:    Additional Comments:  Lanier ClamMahabir, Hanish Laraia, RN 08/21/2016, 10:24 AM

## 2016-08-21 NOTE — Progress Notes (Signed)
Reviewed discharge information with patient and caregiver. Answered all questions. Patient and caregiver able to teach back medications and reasons to contact MD or 911. Patient verbalizes importance of PCP follow up appointment. 

## 2016-08-21 NOTE — Evaluation (Signed)
Physical Therapy Evaluation Patient Details Name: Gabrielle [name] MRN: [id number] DOB: 12/28/1945 Today's Date: 08/21/2016   History of Present Illness   Gabrielle [name]  is a 71 y.o. female, With past medical history relevant for chronic diastolic CHF, atrial fibrillation and pulmonary hypertension, s/p CABG, and PPM who presents to the ED with worsening shortness of breath.  Also had fall few weeks ago and found to have T12 compression fx.   Clinical Impression  Patient presents with decreased mobility due to pain, limited activity tolerance, decreased balance and safety awareness.  Feel she can benefit from skilled PT in the acute setting to allow return home with HHPT and aide assist.  Patient currently S to minguard for ambulation and stairs.  Will have some assist from daughter for home entry, but alone at home and states hopeful to get aide set up through her insurance.  RNCM aware.      Follow Up Recommendations Home health PT [facility] aide)    Equipment Recommendations  None recommended by PT    Recommendations for Other Services       Precautions / Restrictions Precautions Precautions: Fall;Back Precaution Booklet Issued: No Precaution Comments: educated in log roll due to T12 compression fx Required Braces or Orthoses: Other Brace/Splint Other Brace/Splint: binder Restrictions Weight Bearing Restrictions: No      Mobility  Bed Mobility Overal bed mobility: Needs Assistance Bed Mobility: Rolling;Sidelying to Sit;Sit to Sidelying Rolling: Supervision Sidelying to sit: Supervision;HOB elevated     Sit to sidelying: HOB elevated;Min assist General bed mobility comments: cues for log roll technique and using rail, assist to sidelying for technique/legs  Transfers Overall transfer level: Needs assistance Equipment used: Rolling walker (2 wheeled) Transfers: Sit to/from Stand Sit to Stand: Supervision         General transfer comment: for  safety  Ambulation/Gait Ambulation/Gait assistance: Supervision Ambulation Distance (Feet): 225 Feet Assistive device: Rolling walker (2 wheeled) Gait Pattern/deviations: Step-through pattern;Decreased stride length;Trunk flexed     General Gait Details: slower pace, but safe with RW, also ambulation short distance no device without signs of instability, but c/o increased back pain  Stairs Stairs: Yes Stairs assistance: Supervision;Min guard Stair Management: One rail Right;Two rails;Alternating pattern;Step to pattern;Sideways;Forwards Number of Stairs: 10 General stair comments: ascended with two rails and slow alternating pattern, then descending two rails initially with step to c/o dizziness, cues to use sideways technique with two hands on one rail and pt reports felt better  Wheelchair Mobility    Modified Rankin (Stroke Patients Only)       Balance Overall balance assessment: Needs assistance Sitting-balance support: Feet supported Sitting balance-Leahy Scale: Good     Standing balance support: No upper extremity supported Standing balance-Leahy Scale: Good Standing balance comment: static standing without imbalance, but more pain without UE support                             Pertinent Vitals/Pain Pain Assessment: 0-10 Pain Score: 5  Pain Location: back with binder helping Pain Descriptors / Indicators: Discomfort;Guarding Pain Intervention(s): Monitored during session;Repositioned    Home Living Family/patient expects to be discharged to:: Private residence Living Arrangements: Alone Available Help at Discharge: Family;Available PRN/intermittently Type of Home: House Home Access: Stairs to enter Entrance Stairs-Rails: Right Entrance Stairs-Number of Steps: 4 Home Layout: Multi-level Home Equipment: Cane - single point;Bedside commode;Walker - 2 wheels;Shower seat;Walker - 4 wheels Additional Comments: pt states that she also has  a dog to take  care of at her home    Prior Function Level of Independence: Independent with assistive device(s)         Comments: sister usually helps with transportation, groceries, but is out of town right now.  Daughter also lives close, reports recent cataract surgery bilateral     Hand Dominance   Dominant Hand: Right    Extremity/Trunk Assessment   Upper Extremity Assessment Upper Extremity Assessment: Overall WFL for tasks assessed    Lower Extremity Assessment Lower Extremity Assessment: Generalized weakness    Cervical / Trunk Assessment Cervical / Trunk Assessment: Kyphotic  Communication   Communication: No difficulties  Cognition Arousal/Alertness: Awake/alert Behavior During Therapy: WFL for tasks assessed/performed Overall Cognitive Status: Within Functional Limits for tasks assessed                                        General Comments General comments (skin integrity, edema, etc.): Earlier ambulation with nursing to check SpO2 and noted WNL on RA.  Pt. c/o dizziness with sup <> sit.  Supine head roll and modified DHP negative for spinning or nystagmus, but pt did not keep eyes open with sit> supine for visualizing if nystagmus present.  Discussed safety with sitting several minutes prior to standing and not walking if feeling weak or dizzy in standing.  Also discussed foot wear and proper fitting clothing for preventing falls as reports fell due to pants under her feet    Exercises     Assessment/Plan    PT Assessment Patient needs continued PT services  PT Problem List Decreased balance;Decreased mobility;Pain;Decreased knowledge of use of DME;Decreased activity tolerance;Decreased safety awareness       PT Treatment Interventions DME instruction;Gait training;Therapeutic exercise;Patient/family education;Stair training;Balance training;Functional mobility training;Neuromuscular re-education;Therapeutic activities    PT Goals (Current goals can  be found in the Care Plan section)  Acute Rehab PT Goals Patient Stated Goal: To return home PT Goal Formulation: With patient Time For Goal Achievement: 08/28/16 Potential to Achieve Goals: Good    Frequency Min 3X/week   Barriers to discharge        Co-evaluation               AM-PAC PT "6 Clicks" Daily Activity  Outcome Measure Difficulty turning over in bed (including adjusting bedclothes, sheets and blankets)?: Total Difficulty moving from lying on back to sitting on the side of the bed? : Total Difficulty sitting down on and standing up from a chair with arms (e.g., wheelchair, bedside commode, etc,.)?: A Little Help needed moving to and from a bed to chair (including a wheelchair)?: A Little Help needed walking in hospital room?: A Little Help needed climbing 3-5 steps with a railing? : A Little 6 Click Score: 14    End of Session Equipment Utilized During Treatment: Gait belt;Other (comment) (binder) Activity Tolerance: Patient tolerated treatment well Patient left: in bed;with call bell/phone within reach;with bed alarm set Nurse Communication: Mobility status PT Visit Diagnosis: History of falling (Z91.81);Difficulty in walking, not elsewhere classified (R26.2);Dizziness and giddiness (R42)    Time: 1610-9604 PT Time Calculation (min) (ACUTE ONLY): 39 min   Charges:   PT Evaluation $PT Eval Moderate Complexity: 1 Procedure PT Treatments $Gait Training: 8-22 mins $Self Care/Home Management: November 08, 2022   PT G CodesSheran Lawless, [place] [phone] 08/21/2016   Elray Mcgregor  08/21/2016, 11:28 AM

## 2016-08-21 NOTE — Discharge Summary (Addendum)
Physician Discharge Summary  Gabrielle [name]:[id number] DOB: 22-Oct-1945 DOA: 08/19/2016  PCP: Henrine Screws, MD  Admit date: 08/19/2016 Discharge date: 08/21/2016  Admitted From:home Disposition:home with home care services  Recommendations for Outpatient Follow-up:  1. Follow up with PCP in 1-2 weeks 2. Please obtain BMP/CBC in one week   Home Health:yes Equipment/Devices:back brace Discharge Condition:stable CODE STATUS:DNR Diet recommendation:heart healthy  Brief/Interim Summary: 71 year old female with history of chronic diastolic congestive heart failure, paroxysmal atrial fibrillation, pulmonary hypertension, hypertension, presented with worsening shortness of breath, dyspnea on exertion and lower extremity edema. Patient was found to have hypoxia on admission consistent with CHF exacerbation. Started on Lasix, oxygen and admitted for further evaluation.  #Acute on chronic diastolic congestive heart failure: Likely in the setting of noncompliance with the Lasix and increasing the amount of Gatorade. Patient reported that the Lasix dose was increased recently however she was not taking.  -She had echocardiogram on 04/2016: Showed EF of 55% with grade 3 diastolic dysfunction.  -BNP elevated to 1113.7, chest x-ray consistent with pulmonary vascular congestion.  -Treated with IV Lasix with significant improvement in symptoms. Hypoxia improved. Shortness of breath improved. Able to ambulate without difficulties. Education provided to the patient to reduce the amount of fluid and adhere with prescribed Lasix. The low dose of Lasix adjusted to 40 mg twice a day. Recommended to follow up with cardiologist.  #Acute hypoxic respiratory failure in the setting of CHF: Improved. Able to ambulate in room air with acceptable oxygen saturation.   #Paroxysmal atrial fibrillation: Monitor heart rate. Not on anticoagulation because of history of GI bleed. Continue metoprolol and monitor heart  rate. Added aspirin for stroke prevention. -HR on lower side and asymptomatic. Adjusted the dose of metoprolol to once a day home dose. Recommended to monitor heart rate at home.  #Chronic back pain with recent fall: X-ray with T12 vertebral body compression fracture. Ordere and discussed with the orthopedic technician. Continue pain management and recommended to follow up with PCP.  #Chronic anemia, unspecified: Hemoglobin around baseline. Continue to monitor.  #Thrombocytopenia which is chronic. No sign of bleeding. Continue to monitor CBC.  Patient is clinically stable. Eager to go home. Home care ordered. Discussed with the nurses, case management team and pharmacist regarding the discharge planning.  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    Complete by:  As directed    Call MD for:  difficulty breathing, headache or visual disturbances    Complete by:  As directed    Call MD for:  extreme fatigue    Complete by:  As directed    Call MD for:  hives    Complete by:  As directed    Call MD for:  persistant dizziness or light-headedness  Complete by:  As directed    Call MD for:  persistant nausea and vomiting    Complete by:  As directed    Call MD for:  severe uncontrolled pain    Complete  by:  As directed    Call MD for:  temperature >100.4    Complete by:  As directed    Diet - low sodium heart healthy    Complete by:  As directed    Increase activity slowly    Complete by:  As directed      Allergies as of 08/21/2016      Reactions   Novocain [procaine] Hives, Palpitations   Penicillins Hives, Other (See Comments)   Has patient had a PCN reaction causing immediate rash, facial/tongue/throat swelling, SOB or lightheadedness with hypotension: No Has patient had a PCN reaction causing severe rash involving mucus membranes or skin necrosis: No Has patient had a PCN reaction that required hospitalization No Has patient had a PCN reaction occurring within the last 10 years: No If all of the above answers are "NO", then may proceed with Cephalosporin use.      Medication List    TAKE these medications   albuterol 108 (90 Base) MCG/ACT inhaler Commonly known as:  PROVENTIL HFA;VENTOLIN HFA Inhale 2 puffs into the lungs every 6 (six) hours as needed for wheezing or shortness of breath.   ALPRAZolam 0.5 MG tablet Commonly known as:  XANAX Take 0.5 mg by mouth 2 (two) times daily.   aspirin 81 MG EC tablet Take 1 tablet (81 mg total) by mouth daily. Start taking on:  08/22/2016   atorvastatin 20 MG tablet Commonly known as:  LIPITOR Take 20 mg by mouth daily.   budesonide-formoterol 160-4.5 MCG/ACT inhaler Commonly known as:  SYMBICORT Inhale 2 puffs into the lungs 2 (two) times daily.   citalopram 20 MG tablet Commonly known as:  CELEXA Take 20 mg by mouth daily.   clobetasol cream 0.05 % Commonly known as:  TEMOVATE Apply 1 application topically 2 (two) times daily. Affected skin   docusate sodium 100 MG capsule Commonly known as:  COLACE Take 100 mg by mouth 2 (two) times daily.   ferrous sulfate 325 (65 FE) MG tablet Take 325 mg by mouth daily.   fluticasone 50 MCG/ACT nasal spray Commonly known as:  FLONASE Place 2 sprays into both nostrils  daily.   furosemide 40 MG tablet Commonly known as:  LASIX Take 1 tablet (40 mg total) by mouth 2 (two) times daily. What changed:  medication strength  how much to take  when to take this  additional instructions   metoprolol tartrate 25 MG tablet Commonly known as:  LOPRESSOR Take 1 tablet (25 mg total) by mouth at bedtime.   pantoprazole 40 MG tablet Commonly known as:  PROTONIX Take 40 mg by mouth daily before breakfast.   potassium chloride SA 20 MEQ tablet Commonly known as:  K-DUR,KLOR-CON Take 1 tablet (20 mEq total) by mouth daily.   ranitidine 150 MG tablet Commonly known as:  ZANTAC Take 1 tablet (150 mg total) by mouth at bedtime.   SYSTANE ULTRA 0.4-0.3 % Soln Generic drug:  Polyethyl Glycol-Propyl Glycol Place 1 drop into both eyes daily as needed (for dry eyes).   tiZANidine 4 MG tablet Commonly known as:  ZANAFLEX Take 4 mg by mouth at bedtime as needed for muscle spasms.   traMADol 50 MG tablet Commonly known as:  ULTRAM Take by mouth. Take one tablet  every 6 hours as needed for pain   Vitamin D (Ergocalciferol) 50000 units Caps capsule Commonly known as:  DRISDOL Take 1 capsule (50,000 Units total) by mouth every 7 (seven) days.      Follow-up Information    [facility] Follow up.   Why:  HH physical therapy,aide Contact information: [address] [place] [address] [phone]        Henrine Screwshacker, Robert, MD. Schedule an appointment as soon as possible for a visit in 1 week(s).   Specialty:  Family Medicine Contact information: [id number] [place] [address] [phone]        Jake BatheSkains, Mark C, MD. Schedule an appointment as soon as possible for a visit in 1 week(s).   Specialty:  Cardiology Contact information: [address] [place] [address] [phone]          Allergies  Allergen Reactions  . Novocain [Procaine] Hives and Palpitations  . Penicillins  Hives and Other (See Comments)    Has patient had a PCN reaction causing immediate rash, facial/tongue/throat swelling, SOB or lightheadedness with hypotension: No Has patient had a PCN reaction causing severe rash involving mucus membranes or skin necrosis: No Has patient had a PCN reaction that required hospitalization No Has patient had a PCN reaction occurring within the last 10 years: No If all of the above answers are "NO", then may proceed with Cephalosporin use.    Consultations: None  Procedures/Studies: None  Subjective: Seen and examined at bedside. Reported feeling good and eager to go home today. Denied headache, dizziness, chest pain, shortness of breath. No abdominal pain.  Discharge Exam: Vitals:   08/20/16 2020 08/21/16 0518  BP: (!) 124/50 (!) 110/56  Pulse: 69 (!) 52  Resp: 18 (!) 93  Temp: 97.7 F (36.5 C) 98.3 F (36.8 C)   Vitals:   08/20/16 1429 08/20/16 2020 08/21/16 0500 08/21/16 0518  BP: (!) 114/56 (!) 124/50  (!) 110/56  Pulse: (!) 59 69  (!) 52  Resp: 19 18  (!) 93  Temp: 98.1 F (36.7 C) 97.7 F (36.5 C)  98.3 F (36.8 C)  TempSrc: Oral Oral  Oral  SpO2: 92% 94%  93%  Weight:   81.1 kg (178 lb 12.8 oz)   Height:        General: Sitting on chair comfortable Cardiovascular: Irregular heart rate, S1-2 normal Respiratory: Clear bilateral, no wheezing or crackle. Abdominal: Soft, NT, ND, bowel sounds + Extremities: no edema, no cyanosis    The results of significant diagnostics from this hospitalization (including imaging, microbiology, ancillary and laboratory) are listed below for reference.     Microbiology: Recent Results (from the past 240 hour(s))  MRSA PCR Screening     Status: None   Collection Time: 08/20/16 12:47 PM  Result Value Ref Range Status   MRSA by PCR NEGATIVE NEGATIVE Final    Comment:        The GeneXpert MRSA Assay (FDA approved for NASAL specimens only), is one component of a comprehensive MRSA  colonization surveillance program. It is not intended to diagnose MRSA infection nor to guide or monitor treatment for MRSA infections.      Labs: BNP (last 3 results)  Recent Labs  12/16/15 1544 08/19/16 1415  BNP 751.9* 1,113.7*   Basic Metabolic Panel:  Recent Labs Lab 08/19/16 1415 08/20/16 1137 08/21/16 0745  NA 138 142 138  K 3.5 3.2* 4.2  CL 103 103  100*  CO2 28 30 32  GLUCOSE 102* 144* 87  BUN 5* 8 11  CREATININE 0.84 0.99 1.01*  CALCIUM 8.9 8.7* 9.0  MG  --  1.5* 1.9   Liver Function Tests: No results for input(s): AST, ALT, ALKPHOS, BILITOT, PROT, ALBUMIN in the last 168 hours. No results for input(s): LIPASE, AMYLASE in the last 168 hours. No results for input(s): AMMONIA in the last 168 hours. CBC:  Recent Labs Lab 08/19/16 1415 08/20/16 1137  WBC 8.1 7.2  NEUTROABS 5.5  --   HGB 9.8* 9.6*  HCT 30.6* 30.0*  MCV 92.7 92.3  PLT 98* 104*   Cardiac Enzymes:  Recent Labs Lab 08/19/16 2208 08/20/16 0606  TROPONINI <0.03 <0.03   BNP: Invalid input(s): POCBNP CBG: No results for input(s): GLUCAP in the last 168 hours. D-Dimer No results for input(s): DDIMER in the last 72 hours. Hgb A1c No results for input(s): HGBA1C in the last 72 hours. Lipid Profile No results for input(s): CHOL, HDL, LDLCALC, TRIG, CHOLHDL, LDLDIRECT in the last 72 hours. Thyroid function studies No results for input(s): TSH, T4TOTAL, T3FREE, THYROIDAB in the last 72 hours.  Invalid input(s): FREET3 Anemia work up No results for input(s): VITAMINB12, FOLATE, FERRITIN, TIBC, IRON, RETICCTPCT in the last 72 hours. Urinalysis    Component Value Date/Time   COLORURINE YELLOW 05/11/2016 2000   APPEARANCEUR HAZY (A) 05/11/2016 2000   LABSPEC 1.017 05/11/2016 2000   PHURINE 5.0 05/11/2016 2000   GLUCOSEU NEGATIVE 05/11/2016 2000   HGBUR LARGE (A) 05/11/2016 2000   BILIRUBINUR NEGATIVE 05/11/2016 2000   KETONESUR NEGATIVE 05/11/2016 2000   PROTEINUR NEGATIVE  05/11/2016 2000   UROBILINOGEN 1.0 12/02/2010 1023   NITRITE NEGATIVE 05/11/2016 2000   LEUKOCYTESUR TRACE (A) 05/11/2016 2000   Sepsis Labs Invalid input(s): PROCALCITONIN,  WBC,  LACTICIDVEN Microbiology Recent Results (from the past 240 hour(s))  MRSA PCR Screening     Status: None   Collection Time: 08/20/16 12:47 PM  Result Value Ref Range Status   MRSA by PCR NEGATIVE NEGATIVE Final    Comment:        The GeneXpert MRSA Assay (FDA approved for NASAL specimens only), is one component of a comprehensive MRSA colonization surveillance program. It is not intended to diagnose MRSA infection nor to guide or monitor treatment for MRSA infections.      Time coordinating discharge: 33 minutes  SIGNED:   Maxie Barb, MD  [facility] 08/21/2016, 11:00 AM  If 7PM-7AM, please contact night-coverage [email or web address] Password [email or web address]

## 2016-08-31 ENCOUNTER — Ambulatory Visit: Payer: Medicare Other | Admitting: Nurse Practitioner

## 2016-08-31 NOTE — Progress Notes (Deleted)
CARDIOLOGY OFFICE NOTE  Date:  08/31/2016    Gabrielle [name] Date of Birth: April 20, 1945 Medical Record #[id number]  PCP:  Gabrielle Screwshacker, Robert, MD  Cardiologist:  Gabrielle [name] & ***    No chief complaint on file.   History of Present Illness: [name] is a 71 y.o. female who presents today for a ***  female  with coronary artery disease status post bypass surgery in 2010, paroxysmal atrial fibrillation, tachycardia-bradycardia syndrome with pacemaker placement, EF 45-50%. COPD, obesity who unfortunately was hospitalized multiple times in 2017 with significant lower GI bleed, bright red blood, hematochezia with hemoglobin of 4 requiring a total of 6 units of packed red blood cells, 4 during initial admission and 2 during a repeat admission after she had hypotension and more blood loss. Dr. Randa EvensEdwards GI. Friable mucosa noted on colonoscopy. Presumed diverticular bleed. She has been off of her Eliquis since then. Please see below for full details.  She had prolonged QT as well, stopped Celexa and sotalol.  Gabrielle [name] twin.  06/30/16-she was hospitalized again in March with delirium, taking Xanax, muscle relaxants, had UTI as well. Hemoglobin remained 8.5 on discharge. 2 low for anticoagulation. See below. No chest pain, baseline shortness of breath noted.  Comes in today. Here with   Past Medical History:  Diagnosis Date  . Anemia   . Arthritis   . Asthma   . Atrial fibrillation with controlled ventricular response (HCC) 11/2009   a. not on anticoagulation due to recurrent GI bleeding while on Eliquis.  . Chronic bronchitis   . Chronic diastolic CHF (congestive heart failure) (HCC)    a. Echo 12/2015: EF 55-60% w/ Grade 3 DD  . Chronic headache   . COPD (chronic obstructive pulmonary disease) (HCC)   . Depression   . Heart attack (HCC)   . Hyperlipidemia   . Hypertension     Past Surgical History:  Procedure Laterality Date  . CAD-CABG     x7, brief post-op Atrial Fib    . CESAREAN SECTION     x2  . COLONOSCOPY WITH PROPOFOL N/A 10/24/2015   Procedure: COLONOSCOPY WITH PROPOFOL;  Surgeon: Carman ChingJames Edwards, MD;  [facility];  Service: Endoscopy;  Laterality: N/A;  . CORONARY ARTERY BYPASS GRAFT  2011  . EP IMPLANTABLE DEVICE N/A 12/26/2015   Procedure: Pacemaker Implant;  Surgeon: Marinus MawGregg W Taylor, MD;  [facility];  Service: Cardiovascular;  Laterality: N/A;  . ESOPHAGOGASTRODUODENOSCOPY N/A 10/22/2015   Procedure: ESOPHAGOGASTRODUODENOSCOPY (EGD);  Surgeon: Carman ChingJames Edwards, MD;  [facility];  Service: Endoscopy;  Laterality: N/A;  . ESOPHAGOGASTRODUODENOSCOPY N/A 12/24/2015   Procedure: ESOPHAGOGASTRODUODENOSCOPY (EGD);  Surgeon: Carman ChingJames Edwards, MD;  [facility];  Service: Endoscopy;  Laterality: N/A;  . JOINT REPLACEMENT Bilateral   . TONSILLECTOMY    . TOTAL KNEE ARTHROPLASTY       Medications: No outpatient prescriptions have been marked as taking for the 08/31/16 encounter (Appointment) with Gabrielle [name].     Allergies: Allergies  Allergen Reactions  . Novocain [Procaine] Hives and Palpitations  . Penicillins Hives and Other (See Comments)    Has patient had a PCN reaction causing immediate rash, facial/tongue/throat swelling, SOB or lightheadedness with hypotension: No Has patient had a PCN reaction causing severe rash involving mucus membranes or skin necrosis: No Has patient had a PCN reaction that required hospitalization No Has patient had a PCN reaction occurring within the last 10 years: No If all of the above  answers are "NO", then may proceed with Cephalosporin use.    Social History: The patient  reports that she has been smoking Cigarettes.  She has been smoking about 0.20 packs per day. She has never used smokeless tobacco. She reports that she does not drink alcohol or use drugs.   Family History: The patient's ***family history includes Diabetes in her mother; Heart attack in her  father; Heart disease in her father and mother; Hypertension in her daughter, father, and mother; Lung cancer in her maternal grandfather and paternal grandfather; Stroke in her paternal grandmother.   Review of Systems: Please see the history of present illness.   Otherwise, the review of systems is positive for {NONE DEFAULTED:18576::"none"}.   All other systems are reviewed and negative.   Physical Exam: VS:  LMP  (LMP Unknown)  .  BMI There is no height or weight on file to calculate BMI.  Wt Readings from Last 3 Encounters:  08/21/16 178 lb 12.8 oz (81.1 kg)  06/30/16 173 lb 3.2 oz (78.6 kg)  06/05/16 179 lb 3.2 oz (81.3 kg)    General: Pleasant. Well developed, well nourished and in no acute distress.   HEENT: Normal.  Neck: Supple, no JVD, carotid bruits, or masses noted.  Cardiac: ***Regular rate and rhythm. No murmurs, rubs, or gallops. No edema.  Respiratory:  Lungs are clear to auscultation bilaterally with normal work of breathing.  GI: Soft and nontender.  MS: No deformity or atrophy. Gait and ROM intact.  Skin: Warm and dry. Color is normal.  Neuro:  Strength and sensation are intact and no gross focal deficits noted.  Psych: Alert, appropriate and with normal affect.   LABORATORY DATA:  EKG:  EKG {ACTION; IS/IS YQM:[id number]} ordered today. This demonstrates ***.  Lab Results  Component Value Date   WBC 7.2 08/20/2016   HGB 9.6 (L) 08/20/2016   HCT 30.0 (L) 08/20/2016   PLT 104 (L) 08/20/2016   GLUCOSE 87 08/21/2016   CHOL  11/28/2009    112        ATP III CLASSIFICATION:  <200     mg/dL   Desirable  952-841  mg/dL   Borderline High  >=324    mg/dL   High          TRIG 99 11/28/2009   HDL 52 11/28/2009   LDLCALC  11/28/2009    40        Total Cholesterol/HDL:CHD Risk Coronary Heart Disease Risk Table                     Men   Women  1/2 Average Risk   3.4   3.3  Average Risk       5.0   4.4  2 X Average Risk   9.6   7.1  3 X Average Risk  23.4    11.0        Use the calculated Patient Ratio above and the CHD Risk Table to determine the patient's CHD Risk.        ATP III CLASSIFICATION (LDL):  <100     mg/dL   Optimal  401-027  mg/dL   Near or Above                    Optimal  130-159  mg/dL   Borderline  253-664  mg/dL   High  >403     mg/dL   Very High   ALT 10 47/42/5956  AST 29 05/26/2016   NA 138 08/21/2016   K 4.2 08/21/2016   CL 100 (L) 08/21/2016   CREATININE 1.01 (H) 08/21/2016   BUN 11 08/21/2016   CO2 32 08/21/2016   TSH 0.644 05/13/2016   INR 2.01 10/23/2015   HGBA1C (H) 11/27/2009    6.1 (NOTE)                                                                       According to the ADA Clinical Practice Recommendations for 2011, when HbA1c is used as a screening test:   >=6.5%   Diagnostic of Diabetes Mellitus           (if abnormal result  is confirmed)  5.7-6.4%   Increased risk of developing Diabetes Mellitus  References:Diagnosis and Classification of Diabetes Mellitus,Diabetes Care,2011,34(Suppl 1):S62-S69 and Standards of Medical Care in         Diabetes - 2011,Diabetes Care,2011,34  (Suppl 1):S11-S61.     BNP (last 3 results)  Recent Labs  12/16/15 1544 08/19/16 1415  BNP 751.9* 1,113.7*    ProBNP (last 3 results) No results for input(s): PROBNP in the last 8760 hours.   Other Studies Reviewed Today:   Assessment/Plan: Paroxysmal atrial fibrillation  - Last ECG on 05/16/16 shows sinus rhythm.  - She has been off of sotalol.  - Pacemaker previously placed because of bradycardia.  - We will decrease her metoprolol to 25 mg at night. I tried to get her to stop it but she does not desire to do so.  - We will also stop her losartan which she is currently taking it 25 mg because of her hypotension.  Chronic anti-coagulation  - Previously on Eliquis but she had severe bleeding, 4 units of blood then repeat admission again with 2 units of blood with hemoglobin of 4 on admit. Friable tissue on  colonoscopy. Recurrent hematochezia.  - Last hemoglobin is 8.5  - I think the risks of resuming anticoagulation outweigh the benefit.  - She understands that she is at risk for stroke. She also has had delirium as well.  Coronary artery disease  - Post CABG, stable, no angina  Sick sinus syndrome-pacemaker placement  - Stable.  COPD  -  Per primary team  - Continue to encourage smoking cessation.  Chronic diastolic heart failure  - No edema on exam today. Be careful with Lasix given her hypotension.  Psoriasis  - Methotrexate   2 week follow up, would be great if she saw APP If blood pressure remains low, discontinue metoprolol..  Current medicines are reviewed with the patient today.  The patient does not have concerns regarding medicines other than what has been noted above.  The following changes have been made:  See above.  Labs/ tests ordered today include:   No orders of the defined types were placed in this encounter.    Disposition:   FU with *** in {gen number 1-61:096045} {Days to years:10300}.   Patient is agreeable to this plan and will call if any problems develop in the interim.   SignedNorma Fredrickson, [name]  08/31/2016 7:26 AM  [facility] [address] [place], [place]  [address] Phone: [phone] Fax: [phone]        

## 2016-09-07 ENCOUNTER — Encounter: Payer: Medicare Other | Admitting: Internal Medicine

## 2016-09-08 ENCOUNTER — Telehealth: Payer: Self-pay | Admitting: Internal Medicine

## 2016-09-08 NOTE — Telephone Encounter (Signed)
close

## 2016-09-09 ENCOUNTER — Encounter: Payer: Self-pay | Admitting: Internal Medicine

## 2016-09-11 ENCOUNTER — Other Ambulatory Visit (HOSPITAL_COMMUNITY): Payer: Medicare Other

## 2016-09-11 ENCOUNTER — Ambulatory Visit: Payer: Medicare Other | Admitting: Vascular Surgery

## 2016-09-11 NOTE — Progress Notes (Signed)
PT G-Code Note    08/21/16 1132  PT G-Codes **NOT FOR INPATIENT CLASS**  Functional Assessment Tool Used AM-PAC 6 Clicks Basic Mobility  Functional Limitation Mobility: Walking and moving around  Mobility: Walking and Moving Around Current Status ([phone](G8978) CK  Mobility: Walking and Moving Around Goal Status [phone](G8979) CK  Mobility: Walking and Moving Around Discharge Status [phone](G8980) CK  Valloniayndi Wynn, [place] [phone] 09/11/2016

## 2016-09-18 ENCOUNTER — Other Ambulatory Visit (HOSPITAL_COMMUNITY): Payer: Medicare Other

## 2016-09-18 ENCOUNTER — Ambulatory Visit: Payer: Medicare Other | Admitting: Vascular Surgery

## 2016-09-28 ENCOUNTER — Ambulatory Visit (INDEPENDENT_AMBULATORY_CARE_PROVIDER_SITE_OTHER): Payer: Medicare Other | Admitting: Nurse Practitioner

## 2016-09-28 ENCOUNTER — Encounter: Payer: Self-pay | Admitting: Nurse Practitioner

## 2016-09-28 VITALS — BP 126/74 | HR 62 | Ht 62.5 in | Wt 164.4 lb

## 2016-09-28 DIAGNOSIS — I495 Sick sinus syndrome: Secondary | ICD-10-CM

## 2016-09-28 DIAGNOSIS — I5032 Chronic diastolic (congestive) heart failure: Secondary | ICD-10-CM | POA: Diagnosis not present

## 2016-09-28 DIAGNOSIS — I10 Essential (primary) hypertension: Secondary | ICD-10-CM

## 2016-09-28 DIAGNOSIS — I480 Paroxysmal atrial fibrillation: Secondary | ICD-10-CM | POA: Diagnosis not present

## 2016-09-28 NOTE — Patient Instructions (Addendum)
We will be checking the following labs today - BMET, CBC and BNP   Medication Instructions:    Continue with your current medicines.     Testing/Procedures To Be Arranged:  N/A  Follow-Up:   See Dr. Anne FuSkains in 3 months    Other Special Instructions:   N/A    If you need a refill on your cardiac medications before your next appointment, please call your pharmacy.   Call the [facility] office at [phone] if you have any questions, problems or concerns.

## 2016-09-28 NOTE — Progress Notes (Signed)
CARDIOLOGY OFFICE NOTE  Date:  09/28/2016    Gabrielle [name] Date of Birth: 01-19-1946 Medical Record #[id number]  PCP:  Gabrielle [name], Robert, MD  Cardiologist:  Gabrielle [name]   Chief Complaint  Patient presents with  . Congestive Heart Failure    Post hospital visit - seen for Dr. Anne [name]    History of Present Illness: Gabrielle [name] is a 71 y.o. female who presents today for a follow up visit. Seen for Dr. Anne [name].   She has a history of coronary artery disease status post bypass surgery in 2010, paroxysmal atrial fibrillation, tachycardia-bradycardia syndrome with pacemaker placement, EF 45-50%. COPD, obesity who unfortunately was hospitalized multiple times in 2017 with significant lower GI bleed, bright red blood, hematochezia with hemoglobin of 4 requiring a total of 6 units of packed red blood cells, 4 during initial admission and 2 during a repeat admission after she had hypotension and more blood loss. Dr. Randa [name] GI. Friable mucosa noted on colonoscopy. Presumed diverticular bleed. She has been off of her Eliquis since then. Other issues include prolonged QT as well, stopped Celexa and sotalol.  06/30/16-she was hospitalized again in March with delirium, taking Xanax, muscle relaxants, had UTI as well. Hemoglobin remained 8.5 on discharge. Was too low for anticoagulation.   Last seen back in May by Dr. Anne [name]. Was to have come back 2 weeks later. Got readmitted last month with worsening SOB - was hypoxic. She was not taking her Lasix as prescribed and was drinking too much Gatorade.   Comes in today. Here with her twin sister Gabrielle [name]. Ms. Gabrielle [name] seems a little confused with what is going on with her health. She admits that she is not even sure if she wants to even live anymore - "tired of being a burden to her family". Says she has nothing to live for other than her grand kids. Lives alone. Breathing is "good" now.  Still weak. Easy to fall. She does her own medicines. Even when  someone takes over her medicines - she does not take as prescribed. She has personally cut her Lasix back to just one pill a day. She is on Celexa - has no intention of stopping that. Admits she is depressed.   Past Medical History:  Diagnosis Date  . Anemia   . Arthritis   . Asthma   . Atrial fibrillation with controlled ventricular response (HCC) 11/2009   a. not on anticoagulation due to recurrent GI bleeding while on Eliquis.  . Chronic bronchitis   . Chronic diastolic CHF (congestive heart failure) (HCC)    a. Echo 12/2015: EF 55-60% Gabrielle/ Grade 3 DD  . Chronic headache   . COPD (chronic obstructive pulmonary disease) (HCC)   . Depression   . Heart attack (HCC)   . Hyperlipidemia   . Hypertension     Past Surgical History:  Procedure Laterality Date  . CAD-CABG     x7, brief post-op Atrial Fib  . CESAREAN SECTION     x2  . COLONOSCOPY WITH PROPOFOL N/A 10/24/2015   Procedure: COLONOSCOPY WITH PROPOFOL;  Surgeon: Gabrielle ChingJames Edwards, MD;  [facility];  Service: Endoscopy;  Laterality: N/A;  . CORONARY ARTERY BYPASS GRAFT  2011  . EP IMPLANTABLE DEVICE N/A 12/26/2015   Procedure: Pacemaker Implant;  Surgeon: Gabrielle MawGregg Gabrielle Taylor, MD;  [facility];  Service: Cardiovascular;  Laterality: N/A;  . ESOPHAGOGASTRODUODENOSCOPY N/A 10/22/2015   Procedure: ESOPHAGOGASTRODUODENOSCOPY (EGD);  Surgeon: Gabrielle ChingJames Edwards, MD;  [facility];  Service: Endoscopy;  Laterality: N/A;  . ESOPHAGOGASTRODUODENOSCOPY N/A 12/24/2015   Procedure: ESOPHAGOGASTRODUODENOSCOPY (EGD);  Surgeon: Gabrielle ChingJames Edwards, MD;  [facility];  Service: Endoscopy;  Laterality: N/A;  . JOINT REPLACEMENT Bilateral   . TONSILLECTOMY    . TOTAL KNEE ARTHROPLASTY       Medications: Current Meds  Medication Sig  . albuterol (PROVENTIL HFA;VENTOLIN HFA) 108 (90 Base) MCG/ACT inhaler Inhale 2 puffs into the lungs every 6 (six) hours as needed for wheezing or shortness of breath.  . ALPRAZolam  (XANAX) 0.5 MG tablet Take 0.5 mg by mouth 2 (two) times daily.  Marland Kitchen. atorvastatin (LIPITOR) 20 MG tablet Take 20 mg by mouth daily.  . budesonide-formoterol (SYMBICORT) 160-4.5 MCG/ACT inhaler Inhale 2 puffs into the lungs 2 (two) times daily.  . citalopram (CELEXA) 20 MG tablet Take 20 mg by mouth daily.  . clobetasol cream (TEMOVATE) 0.05 % Apply 1 application topically 2 (two) times daily. Affected skin  . docusate sodium (COLACE) 100 MG capsule Take 100 mg by mouth 2 (two) times daily.   . fluticasone (FLONASE) 50 MCG/ACT nasal spray Place 2 sprays into both nostrils daily.  . furosemide (LASIX) 40 MG tablet Take 40 mg by mouth daily.  . metoprolol tartrate (LOPRESSOR) 25 MG tablet Take 1 tablet (25 mg total) by mouth at bedtime.  . pantoprazole (PROTONIX) 40 MG tablet Take 40 mg by mouth daily before breakfast.   . potassium chloride SA (K-DUR,KLOR-CON) 20 MEQ tablet Take 1 tablet (20 mEq total) by mouth daily.  . ranitidine (ZANTAC) 150 MG tablet Take 1 tablet (150 mg total) by mouth at bedtime.  Marland Kitchen. tiZANidine (ZANAFLEX) 4 MG tablet Take 4 mg by mouth at bedtime as needed for muscle spasms.  . traMADol (ULTRAM) 50 MG tablet Take by mouth. Take one tablet every 6 hours as needed for pain  . [DISCONTINUED] aspirin EC 81 MG EC tablet Take 1 tablet (81 mg total) by mouth daily.  . [DISCONTINUED] ferrous sulfate 325 (65 FE) MG tablet Take 325 mg by mouth daily.  . [DISCONTINUED] furosemide (LASIX) 40 MG tablet Take 1 tablet (40 mg total) by mouth 2 (two) times daily.  . [DISCONTINUED] Polyethyl Glycol-Propyl Glycol (SYSTANE ULTRA) 0.4-0.3 % SOLN Place 1 drop into both eyes daily as needed (for dry eyes).   . [DISCONTINUED] Vitamin D, Ergocalciferol, (DRISDOL) 50000 units CAPS capsule Take 1 capsule (50,000 Units total) by mouth every 7 (seven) days.     Allergies: Allergies  Allergen Reactions  . Novocain [Procaine] Hives and Palpitations  . Penicillins Hives and Other (See Comments)    Has  patient had a PCN reaction causing immediate rash, facial/tongue/throat swelling, SOB or lightheadedness with hypotension: No Has patient had a PCN reaction causing severe rash involving mucus membranes or skin necrosis: No Has patient had a PCN reaction that required hospitalization No Has patient had a PCN reaction occurring within the last 10 years: No If all of the above answers are "NO", then may proceed with Cephalosporin use.    Social History: The patient  reports that she has been smoking Cigarettes.  She has been smoking about 0.20 packs per day. She has never used smokeless tobacco. She reports that she does not drink alcohol or use drugs.   Family History: The patient's family history includes Diabetes in her mother; Heart attack in her father; Heart disease in her father and mother; Hypertension in her daughter, father, and mother; Lung cancer in her maternal grandfather and  paternal grandfather; Stroke in her paternal grandmother.   Review of Systems: Please see the history of present illness.   Otherwise, the review of systems is positive for none.   All other systems are reviewed and negative.   Physical Exam: VS:  BP 126/74   Pulse 62   Ht 5' 2.5" (1.588 m)   Wt 164 lb 6.4 oz (74.6 kg)   LMP  (LMP Unknown)   SpO2 98%   BMI 29.59 kg/m  .  BMI Body mass index is 29.59 kg/m.  Wt Readings from Last 3 Encounters:  09/28/16 164 lb 6.4 oz (74.6 kg)  08/21/16 178 lb 12.8 oz (81.1 kg)  06/30/16 173 lb 3.2 oz (78.6 kg)    General: Chronically ill appearing. Tearful and depressed. She is in no acute distress.   HEENT: Normal.  Neck: Supple, no JVD, carotid bruits, or masses noted.  Cardiac: Regular rate and rhythm. No murmurs, rubs, or gallops. No edema.  Respiratory:  Lungs are clear to auscultation bilaterally with normal work of breathing.  GI: Soft and nontender.  MS: No deformity or atrophy. Gait and ROM intact.  Skin: Warm and dry. Color is normal.  Neuro:   Strength and sensation are intact and no gross focal deficits noted.  Psych: Alert, appropriate and with normal affect.   LABORATORY DATA:  EKG:  EKG is ordered today. This demonstrates atrial pacing. Reviewed with Dr. Anne Fu here in the office today - QT is 506 ms  Lab Results  Component Value Date   WBC 7.2 08/20/2016   HGB 9.6 (L) 08/20/2016   HCT 30.0 (L) 08/20/2016   PLT 104 (L) 08/20/2016   GLUCOSE 87 08/21/2016   CHOL  11/28/2009    112        ATP III CLASSIFICATION:  <200     mg/dL   Desirable  161-096  mg/dL   Borderline High  >=045    mg/dL   High          TRIG 99 11/28/2009   HDL 52 11/28/2009   LDLCALC  11/28/2009    40        Total Cholesterol/HDL:CHD Risk Coronary Heart Disease Risk Table                     Men   Women  1/2 Average Risk   3.4   3.3  Average Risk       5.0   4.4  2 X Average Risk   9.6   7.1  3 X Average Risk  23.4   11.0        Use the calculated Patient Ratio above and the CHD Risk Table to determine the patient's CHD Risk.        ATP III CLASSIFICATION (LDL):  <100     mg/dL   Optimal  409-811  mg/dL   Near or Above                    Optimal  130-159  mg/dL   Borderline  914-782  mg/dL   High  >956     mg/dL   Very High   ALT 10 21/30/8657   AST 29 05/26/2016   NA 138 08/21/2016   K 4.2 08/21/2016   CL 100 (L) 08/21/2016   CREATININE 1.01 (H) 08/21/2016   BUN 11 08/21/2016   CO2 32 08/21/2016   TSH 0.644 05/13/2016   INR 2.01 10/23/2015   HGBA1C (H)  11/27/2009    6.1 (NOTE)                                                                       According to the ADA Clinical Practice Recommendations for 2011, when HbA1c is used as a screening test:   >=6.5%   Diagnostic of Diabetes Mellitus           (if abnormal result  is confirmed)  5.7-6.4%   Increased risk of developing Diabetes Mellitus  References:Diagnosis and Classification of Diabetes Mellitus,Diabetes Care,2011,34(Suppl 1):S62-S69 and Standards of Medical Care in          Diabetes - 2011,Diabetes Care,2011,34  (Suppl 1):S11-S61.     BNP (last 3 results)  Recent Labs  12/16/15 1544 08/19/16 1415  BNP 751.9* 1,113.7*    ProBNP (last 3 results) No results for input(s): PROBNP in the last 8760 hours.   Other Studies Reviewed Today:  Echo Study Conclusions 04/2016  - Left ventricle: The cavity size was mildly dilated. Systolic   function was normal. The estimated ejection fraction was 55%.   There is akinesis of the apical myocardium. There is akinesis of   the basalinferior myocardium. There is akinesis of the   apicalanterior myocardium. There was a reduced contribution of   atrial contraction to ventricular filling, due to increased   ventricular diastolic pressure or atrial contractile dysfunction.   Doppler parameters are consistent with a reversible restrictive   pattern, indicative of decreased left ventricular diastolic   compliance and/or increased left atrial pressure (grade 3   diastolic dysfunction). Doppler parameters are consistent with   high ventricular filling pressure. - Aortic valve: There was mild regurgitation. - Mitral valve: Severely calcified annulus. There was moderate   regurgitation directed posteriorly. Mean gradient (D): 7 mm Hg. - Left atrium: The atrium was mildly dilated. - Right ventricle: Pacer wire or catheter noted in right ventricle. - Right atrium: The atrium was mildly dilated. - Tricuspid valve: There was moderate regurgitation. - Pulmonic valve: There was mild regurgitation. - Pulmonary arteries: PA peak pressure: 59 mm Hg (S).  Impressions:  - The right ventricular systolic pressure was increased consistent   with moderate pulmonary hypertension.   Assessment/Plan:  1. Acute on chronic diastolic congestive heart failure: Likely in the setting of noncompliance with the Lasix and increasing the amount of Gatorade. Last EF of 55% with grade 3 diastolic dysfunction. She was diuresed and  improved. She has already cut her Lasix back on her own. Needs repeat lab today. Discussed at length need for sodium restriction and medication compliance.   2. Acute hypoxic respiratory failure in the setting of CHF: Improved with diuresis  3. PAF - Not on anticoagulation because of history of significant GI bleed.  I think the risks of resuming anticoagulation still outweigh the benefit. HR currently ok today.   4. Underlying PPM due to bradycardia  5. Depression  6. Chronic anemia - lab today. Not a candidate for anticoagulation.   7. Coronary artery disease - Post CABG, stable, no angina. Would manage medically.   8. Psoriasis   Current medicines are reviewed with the patient today.  The patient does not have concerns regarding medicines other than what has been noted above.  The following changes have been made:  See above.  Labs/ tests ordered today include:   No orders of the defined types were placed in this encounter.    Disposition:   FU with Dr. Anne Fu in a few months. Overall prognosis tenuous at best.   Patient is agreeable to this plan and will call if any problems develop in the interim.   SignedNorma Fredrickson, NP  09/28/2016 2:53 PM  [facility] [address] [place], [place]  [address] Phone: [phone] Fax: [phone]

## 2016-09-29 LAB — BASIC METABOLIC PANEL
BUN/Creatinine Ratio: 8 — ABNORMAL LOW (ref 12–28)
BUN: 7 mg/dL — ABNORMAL LOW (ref 8–27)
CO2: 27 mmol/L (ref 20–29)
Calcium: 9.6 mg/dL (ref 8.7–10.3)
Chloride: 103 mmol/L (ref 96–106)
Creatinine, Ser: 0.88 mg/dL (ref 0.57–1.00)
GFR calc Af Amer: 76 mL/min/{1.73_m2} (ref >59)
GFR calc non Af Amer: 66 mL/min/{1.73_m2} (ref >59)
Glucose: 105 mg/dL — ABNORMAL HIGH (ref 65–99)
Potassium: 3.8 mmol/L (ref 3.5–5.2)
Sodium: 146 mmol/L — ABNORMAL HIGH (ref 134–144)

## 2016-09-29 LAB — CBC
Hematocrit: 39.3 % (ref 34.0–46.6)
Hemoglobin: 13.4 g/dL (ref 11.1–15.9)
MCH: 30.8 pg (ref 26.6–33.0)
MCHC: 34.1 g/dL (ref 31.5–35.7)
MCV: 90 fL (ref 79–97)
Platelets: 147 10*3/uL — ABNORMAL LOW (ref 150–379)
RBC: 4.35 x10E6/uL (ref 3.77–5.28)
RDW: 16.4 % — ABNORMAL HIGH (ref 12.3–15.4)
WBC: 10.8 10*3/uL (ref 3.4–10.8)

## 2016-09-29 LAB — PRO B NATRIURETIC PEPTIDE: NT-Pro BNP: 963 pg/mL — ABNORMAL HIGH (ref 0–301)

## 2016-09-30 ENCOUNTER — Telehealth: Payer: Self-pay | Admitting: Cardiology

## 2016-09-30 NOTE — Telephone Encounter (Signed)
Patient state that she was prescribed Cosentyx by her dermatologist for psoriasis and wanted to know if it would interfere with her cardiac medications and if Dr. Anne FuSkains is okay with her taking it. Made patient aware that information would be forwarded for Dr. Anne FuSkains to review.

## 2016-09-30 NOTE — Telephone Encounter (Signed)
New message    Patient calling dermatology is ordering Cosentyx will be deliver tomorrow to your home for psoriasis.    Patient wants to know will Dr. Anne FuSkains approve it.

## 2016-10-01 NOTE — Telephone Encounter (Signed)
Pt is aware ok to use.

## 2016-10-01 NOTE — Telephone Encounter (Signed)
Please review. Thanks!  

## 2016-10-01 NOTE — Telephone Encounter (Signed)
No concerns with interactions with any of pt's cardiac medications, she is ok to take Cosentyx.

## 2016-10-06 ENCOUNTER — Encounter: Payer: Self-pay | Admitting: Vascular Surgery

## 2016-10-08 NOTE — Progress Notes (Signed)
Established Abdominal Aortic Aneurysm   History of Present Illness   The patient is a 71 y.o. (Apr 28, 1945) female who presents with chief complaint: follow up for AAA.  Previous studies demonstrate an AAA, measuring 2.4 cm.  The patient does not have back or abdominal pain.  The patient is an active smoker, with decreased cigarette use over the last month.  Past Medical History:  Diagnosis Date  . Anemia   . Arthritis   . Asthma   . Atrial fibrillation with controlled ventricular response (HCC) 11/2009   a. not on anticoagulation due to recurrent GI bleeding while on Eliquis.  . Chronic bronchitis   . Chronic diastolic CHF (congestive heart failure) (HCC)    a. Echo 12/2015: EF 55-60% w/ Grade 3 DD  . Chronic headache   . COPD (chronic obstructive pulmonary disease) (HCC)   . Depression   . Heart attack (HCC)   . Hyperlipidemia   . Hypertension     Past Surgical History:  Procedure Laterality Date  . CAD-CABG     x7, brief post-op Atrial Fib  . CESAREAN SECTION     x2  . COLONOSCOPY WITH PROPOFOL N/A 10/24/2015   Procedure: COLONOSCOPY WITH PROPOFOL;  Surgeon: Carman Ching, MD;  Location: [facility] ENDOSCOPY;  Service: Endoscopy;  Laterality: N/A;  . CORONARY ARTERY BYPASS GRAFT  2011  . EP IMPLANTABLE DEVICE N/A 12/26/2015   Procedure: Pacemaker Implant;  Surgeon: Marinus Maw, MD;  Location[facility] INVASIVE CV LAB;  Service: Cardiovascular;  Laterality: N/A;  . ESOPHAGOGASTRODUODENOSCOPY N/A 10/22/2015   Procedure: ESOPHAGOGASTRODUODENOSCOPY (EGD);  Surgeon: Carman Ching, MD;  Location: [facility] ENDOSCOPY;  Service: Endoscopy;  Laterality: N/A;  . ESOPHAGOGASTRODUODENOSCOPY N/A 12/24/2015   Procedure: ESOPHAGOGASTRODUODENOSCOPY (EGD);  Surgeon: Carman Ching, MD;  Location: [facility] ENDOSCOPY;  Service: Endoscopy;  Laterality: N/A;  . JOINT REPLACEMENT Bilateral   . TONSILLECTOMY    . TOTAL KNEE ARTHROPLASTY      Social History   Social History  . Marital status: Divorced    Spouse  name: N/A  . Number of children: N/A  . Years of education: N/A   Occupational History  . retired Clinical biochemist   Social History Main Topics  . Smoking status: Current Some Day Smoker    Packs/day: 0.20    Types: Cigarettes  . Smokeless tobacco: Never Used     Comment: "Occasionally I will smoke a cigarette"   . Alcohol use No  . Drug use: No  . Sexual activity: Not on file   Other Topics Concern  . Not on file   Social History Narrative  . No narrative on file    Family History  Problem Relation Age of Onset  . Diabetes Mother   . Hypertension Mother   . Heart disease Mother        before age 83  . Heart disease Father        before age 85  . Hypertension Father   . Heart attack Father   . Lung cancer Maternal Grandfather   . Lung cancer Paternal Grandfather   . Hypertension Daughter   . Stroke Paternal Grandmother    Current Outpatient Prescriptions  Medication Sig Dispense Refill  . albuterol (PROVENTIL HFA;VENTOLIN HFA) 108 (90 Base) MCG/ACT inhaler Inhale 2 puffs into the lungs every 6 (six) hours as needed for wheezing or shortness of breath.    . ALPRAZolam (XANAX) 0.5 MG tablet Take 0.5 mg by mouth 2 (two) times daily.  1  .  atorvastatin (LIPITOR) 20 MG tablet Take 20 mg by mouth daily.    . budesonide-formoterol (SYMBICORT) 160-4.5 MCG/ACT inhaler Inhale 2 puffs into the lungs 2 (two) times daily. 10.2 g 6  . citalopram (CELEXA) 20 MG tablet Take 20 mg by mouth daily.  3  . clobetasol cream (TEMOVATE) 0.05 % Apply 1 application topically 2 (two) times daily. Affected skin    . docusate sodium (COLACE) 100 MG capsule Take 100 mg by mouth 2 (two) times daily.     . fluticasone (FLONASE) 50 MCG/ACT nasal spray Place 2 sprays into both nostrils daily. 16 g 0  . furosemide (LASIX) 40 MG tablet Take 40 mg by mouth daily.    . metoprolol tartrate (LOPRESSOR) 25 MG tablet Take 1 tablet (25 mg total) by mouth at bedtime.    . pantoprazole (PROTONIX) 40 MG  tablet Take 40 mg by mouth daily before breakfast.     . potassium chloride SA (K-DUR,KLOR-CON) 20 MEQ tablet Take 1 tablet (20 mEq total) by mouth daily. 30 tablet 0  . ranitidine (ZANTAC) 150 MG tablet Take 1 tablet (150 mg total) by mouth at bedtime.    Marland Kitchen tiZANidine (ZANAFLEX) 4 MG tablet Take 4 mg by mouth at bedtime as needed for muscle spasms.  4  . traMADol (ULTRAM) 50 MG tablet Take by mouth. Take one tablet every 6 hours as needed for pain     No current facility-administered medications for this visit.     On ROS today: +back pain, history of AMS requiring hospitalization   Physical Examination   Vitals:   10/14/16 0939  BP: (!) 107/57  Pulse: 60  Resp: 16  Temp: (!) 97.5 F (36.4 C)  TempSrc: Oral  SpO2: 92%  Weight: 176 lb 6.4 oz (80 kg)  Height: 5\' 1"  (1.549 m)   Body mass index is 33.33 kg/m.  General Alert, O x 3, WD, NAD  Pulmonary Sym exp, good B air movt, CTA B  Cardiac RRR, Nl S1, S2, no Murmurs, No rubs, No S3,S4  Vascular Vessel Right Left  Radial Palpable Palpable  Brachial Palpable Palpable  Carotid Palpable, No Bruit Palpable, No Bruit  Aorta Not palpable N/A  Femoral Palpable Palpable  Popliteal Not palpable Not palpable  PT Palpable Palpable  DP Palpable Palpable    Gastro- intestinal soft, non-distended, non-tender to palpation, No guarding or rebound, no HSM, no masses, no CVAT B, No palpable prominent aortic pulse,    Musculo- skeletal M/S 5/5 throughout  , Extremities without ischemic changes  , No edema present, Varicosities present: small B, No Lipodermatosclerosis present, 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 (10/05/1945) female who presents with: asymptomatic abdominal ectasia with stable size, multiple active co-morbidities   Pt has multiple active medical problems, fortunately her abdominal aortic ectasia does not seem to be growing despite smoking.  We discussed the need to stop smoking to limit AAA growth.  Based on this patient's exam and diagnostic studies, the patient needs q3 year AAA duplex.  The threshold for repair is AAA size >  5.5 cm, growth > 1 cm/yr, and symptomatic status.  I emphasized the importance of maximal medical management including strict control of blood pressure, blood glucose, and lipid levels, antiplatelet agents, obtaining regular exercise, and cessation of smoking.    Thank you for allowing us to participate in this patient's care.   Leonides SakeBrian Ikeya Brockel, MD, FACS [facility] Office: [phone] Pager: [phone]

## 2016-10-14 ENCOUNTER — Ambulatory Visit (INDEPENDENT_AMBULATORY_CARE_PROVIDER_SITE_OTHER): Payer: Medicare Other | Admitting: Vascular Surgery

## 2016-10-14 ENCOUNTER — Encounter: Payer: Self-pay | Admitting: Vascular Surgery

## 2016-10-14 ENCOUNTER — Ambulatory Visit (HOSPITAL_COMMUNITY)
Admission: RE | Admit: 2016-10-14 | Discharge: 2016-10-14 | Disposition: A | Discharge status: Home / Self Care | Payer: Medicare Other | Source: Ambulatory Visit | Attending: Vascular Surgery | Admitting: Vascular Surgery

## 2016-10-14 VITALS — BP 107/57 | HR 60 | Temp 97.5°F | Resp 16 | Ht 61.0 in | Wt 176.4 lb

## 2016-10-14 DIAGNOSIS — I714 Abdominal aortic aneurysm, without rupture, unspecified: Secondary | ICD-10-CM

## 2016-10-14 DIAGNOSIS — I77811 Abdominal aortic ectasia: Secondary | ICD-10-CM

## 2016-10-19 NOTE — Addendum Note (Signed)
Addended by: Burton Apley A on: 10/19/2016 04:11 PM   Modules accepted: Orders

## 2016-10-23 ENCOUNTER — Encounter: Payer: Self-pay | Admitting: Internal Medicine

## 2016-10-23 ENCOUNTER — Ambulatory Visit (INDEPENDENT_AMBULATORY_CARE_PROVIDER_SITE_OTHER): Payer: Medicare Other | Admitting: Internal Medicine

## 2016-10-23 VITALS — BP 110/66 | HR 65 | Ht 61.0 in | Wt 164.2 lb

## 2016-10-23 DIAGNOSIS — I480 Paroxysmal atrial fibrillation: Secondary | ICD-10-CM | POA: Diagnosis not present

## 2016-10-23 DIAGNOSIS — R002 Palpitations: Secondary | ICD-10-CM | POA: Diagnosis not present

## 2016-10-23 DIAGNOSIS — I495 Sick sinus syndrome: Secondary | ICD-10-CM

## 2016-10-23 MED ORDER — METOPROLOL TARTRATE 50 MG PO TABS: 50.0000 mg | ORAL_TABLET | Freq: Every day | ORAL | 3 refills | Status: DC

## 2016-10-23 NOTE — Patient Instructions (Addendum)
Medication Instructions:  Your physician has recommended you make the following change in your medication:  1.  Take an additional 50 mg of metoprolol (one tablet) by mouth as needed for palpitations.     Labwork: None ordered.  Testing/Procedures: None ordered.  Follow-Up: Your physician wants you to follow-up in: one year with Dr. Ladona Ridgel.   You will receive a reminder letter in the mail two months in advance. If you don't receive a letter, please call our office to schedule the follow-up appointment.  Remote monitoring is used to monitor your Pacemaker from home. This monitoring reduces the number of office visits required to check your device to one time per year. It allows [place] to keep an eye on the functioning of your device to ensure it is working properly. You are scheduled for a device check from home on 11/09/2016. You may send your transmission at any time that day. If you have a wireless device, the transmission will be sent automatically. After your physician reviews your transmission, you will receive a postcard with your next transmission date.    Any Other Special Instructions Will Be Listed Below (If Applicable).     If you need a refill on your cardiac medications before your next appointment, please call your pharmacy.

## 2016-10-23 NOTE — Progress Notes (Signed)
HPI Gabrielle [name] returns today for follow-up. She is a 71 year old woman with symptomatic bradycardia, status post permanent pacemaker insertion, with known sinus node dysfunction. The patient has a history of coronary artery disease and is status post bypass surgery. She denies anginal symptoms. Her main complaint today is related to palpitations. She notes that at times her heart will beat fast for a few seconds or up to a minute. She has not had syncope. Allergies  Allergen Reactions  . Novocain [Procaine] Hives and Palpitations  . Penicillins Hives and Other (See Comments)    Has patient had a PCN reaction causing immediate rash, facial/tongue/throat swelling, SOB or lightheadedness with hypotension: No Has patient had a PCN reaction causing severe rash involving mucus membranes or skin necrosis: No Has patient had a PCN reaction that required hospitalization No Has patient had a PCN reaction occurring within the last 10 years: No If all of the above answers are "NO", then may proceed with Cephalosporin use.     Current Outpatient Prescriptions  Medication Sig Dispense Refill  . albuterol (PROVENTIL HFA;VENTOLIN HFA) 108 (90 Base) MCG/ACT inhaler Inhale 2 puffs into the lungs every 6 (six) hours as needed for wheezing or shortness of breath.    . ALPRAZolam (XANAX) 0.5 MG tablet Take 0.5 mg by mouth 2 (two) times daily.  1  . atorvastatin (LIPITOR) 20 MG tablet Take 20 mg by mouth daily.    . budesonide-formoterol (SYMBICORT) 160-4.5 MCG/ACT inhaler Inhale 2 puffs into the lungs 2 (two) times daily. 10.2 g 6  . citalopram (CELEXA) 20 MG tablet Take 20 mg by mouth daily.  3  . clobetasol cream (TEMOVATE) 0.05 % Apply 1 application topically 2 (two) times daily. Affected skin    . COSENTYX SENSOREADY 300 DOSE 150 MG/ML SOAJ as directed.     . docusate sodium (COLACE) 100 MG capsule Take 100 mg by mouth 2 (two) times daily.     . fluticasone (FLONASE) 50 MCG/ACT nasal spray Place 2  sprays into both nostrils daily. 16 g 0  . furosemide (LASIX) 20 MG tablet Take 20 mg by mouth daily. May take 1 extra tablet daily as needed for swelling    . metoprolol tartrate (LOPRESSOR) 25 MG tablet Take 1 tablet (25 mg total) by mouth at bedtime.    . pantoprazole (PROTONIX) 40 MG tablet Take 40 mg by mouth daily before breakfast.     . potassium chloride SA (K-DUR,KLOR-CON) 20 MEQ tablet Take 1 tablet (20 mEq total) by mouth daily. 30 tablet 0  . ranitidine (ZANTAC) 150 MG tablet Take 1 tablet (150 mg total) by mouth at bedtime.    Marland Kitchen tiZANidine (ZANAFLEX) 4 MG tablet Take 4 mg by mouth at bedtime as needed for muscle spasms.  4  . traMADol (ULTRAM) 50 MG tablet Take by mouth. Take one tablet every 6 hours as needed for pain     No current facility-administered medications for this visit.      Past Medical History:  Diagnosis Date  . Anemia   . Arthritis   . Asthma   . Atrial fibrillation with controlled ventricular response (HCC) 11/2009   a. not on anticoagulation due to recurrent GI bleeding while on Eliquis.  . Chronic bronchitis   . Chronic diastolic CHF (congestive heart failure) (HCC)    a. Echo 12/2015: EF 55-60% w/ Grade 3 DD  . Chronic headache   . COPD (chronic obstructive pulmonary disease) (HCC)   . Depression   .  Heart attack (HCC)   . Hyperlipidemia   . Hypertension     ROS:   All systems reviewed and negative except as noted in the HPI.   Past Surgical History:  Procedure Laterality Date  . CAD-CABG     x7, brief post-op Atrial Fib  . CESAREAN SECTION     x2  . COLONOSCOPY WITH PROPOFOL N/A 10/24/2015   Procedure: COLONOSCOPY WITH PROPOFOL;  Surgeon: Carman Ching, MD;  Location: [facility] ENDOSCOPY;  Service: Endoscopy;  Laterality: N/A;  . CORONARY ARTERY BYPASS GRAFT  2011  . EP IMPLANTABLE DEVICE N/A 12/26/2015   Procedure: Pacemaker Implant;  Surgeon: Marinus Maw, MD;  Location: [facility] INVASIVE CV LAB;  Service: Cardiovascular;  Laterality: N/A;  .  ESOPHAGOGASTRODUODENOSCOPY N/A 10/22/2015   Procedure: ESOPHAGOGASTRODUODENOSCOPY (EGD);  Surgeon: Carman Ching, MD;  Location: [facility] ENDOSCOPY;  Service: Endoscopy;  Laterality: N/A;  . ESOPHAGOGASTRODUODENOSCOPY N/A 12/24/2015   Procedure: ESOPHAGOGASTRODUODENOSCOPY (EGD);  Surgeon: Carman Ching, MD;  Location: [facility] ENDOSCOPY;  Service: Endoscopy;  Laterality: N/A;  . JOINT REPLACEMENT Bilateral   . TONSILLECTOMY    . TOTAL KNEE ARTHROPLASTY       Family History  Problem Relation Age of Onset  . Diabetes Mother   . Hypertension Mother   . Heart disease Mother        before age 92  . Heart disease Father        before age 31  . Hypertension Father   . Heart attack Father   . Lung cancer Maternal Grandfather   . Lung cancer Paternal Grandfather   . Hypertension Daughter   . Stroke Paternal Grandmother      Social History   Social History  . Marital status: Divorced    Spouse name: N/A  . Number of children: N/A  . Years of education: N/A   Occupational History  . retired Clinical biochemist   Social History Main Topics  . Smoking status: Current Some Day Smoker    Packs/day: 0.20    Types: Cigarettes  . Smokeless tobacco: Never Used     Comment: "Occasionally I will smoke a cigarette"   . Alcohol use No  . Drug use: No  . Sexual activity: Not on file   Other Topics Concern  . Not on file   Social History Narrative  . No narrative on file     BP 110/66   Pulse 65   Ht 5\' 1"  (1.549 m)   Wt 164 lb 3.2 oz (74.5 kg)   LMP  (LMP Unknown)   SpO2 98%   BMI 31.03 kg/m   Physical Exam:  Well appearing 71 year old woman, NAD HEENT: Unremarkable Neck:  No JVD, no thyromegally Lymphatics:  No adenopathy Back:  No CVA tenderness Lungs:  Clear, with no wheezes, rales, or rhonchi. HEART:  Regular rate rhythm, no murmurs, no rubs, no clicks Abd:  soft, positive bowel sounds, no organomegally, no rebound, no guarding Ext:  2 plus pulses, no edema, no cyanosis,  no clubbing Skin:  No rashes no nodules Neuro:  CN II through XII intact, motor grossly intact  DEVICE  Normal device function.  See PaceArt for details. Normal device function with nonsustained atrial tachycardia  Assess/Plan: 1. Nonsustained atrial tachycardia - she has experience palpitations. I recommended that she take additional metoprolol if she has more palpitations. She will maintain a low caffeine and low alcohol diet and intake. 2. Sinus node dysfunction - she is doing well status post pacemaker insertion and  is asymptomatic. 3. Coronary artery disease - she is status post bypass surgery and denies anginal symptoms. She will undergo watchful waiting.  Lewayne Bunting, M.D.

## 2016-10-29 ENCOUNTER — Telehealth: Payer: Self-pay | Admitting: Cardiology

## 2016-10-29 LAB — CUP PACEART INCLINIC DEVICE CHECK
Date Time Interrogation Session: 20180824040000
Implantable Lead Implant Date: 20171026
Implantable Lead Implant Date: 20171026
Implantable Lead Location: 753859
Implantable Lead Location: 753860
Implantable Lead Model: 7740
Implantable Lead Model: 7741
Implantable Lead Serial Number: 685941
Implantable Lead Serial Number: 818382
Implantable Pulse Generator Implant Date: 20171026
Lead Channel Impedance Value: 662 Ohm
Lead Channel Impedance Value: 883 Ohm
Lead Channel Pacing Threshold Amplitude: 0.8 V
Lead Channel Pacing Threshold Amplitude: 1 V
Lead Channel Pacing Threshold Pulse Width: 0.4 ms
Lead Channel Pacing Threshold Pulse Width: 0.4 ms
Lead Channel Sensing Intrinsic Amplitude: 2.7 mV
Lead Channel Sensing Intrinsic Amplitude: 20.1 mV
Lead Channel Setting Pacing Amplitude: 2 V
Lead Channel Setting Pacing Amplitude: 2.4 V
Lead Channel Setting Pacing Pulse Width: 0.4 ms
Lead Channel Setting Sensing Sensitivity: 2.5 mV
Pulse Gen Serial Number: 766992

## 2016-10-29 NOTE — Telephone Encounter (Signed)
New message       Pt c/o medication issue:  1. Name of Medication:  Metoprolol tartrate 2. How are you currently taking this medication (dosage and times per day)? 50mg  3. Are you having a reaction (difficulty breathing--STAT)?  no 4. What is your medication issue?  Pt picked up presc from pharmacy.  It was metoprolol tartrate instead of the succinate.  She was not told new medication was being called in.  Is she supposed to take both metoprolol presc? Please call

## 2016-10-29 NOTE — Telephone Encounter (Signed)
In reviewing the chart on 05/11/16 when pt went into the hospital she was taking Metoprolol succinate however when she was d/ed on 3/22 she was d/ced on metoprolol tartrate 25 mg BID.  Attempted to contact pt to discuss however received a voicemail stating the mail box is full and to try again later.

## 2016-10-30 NOTE — Telephone Encounter (Signed)
Spoke with pt who is aware her medication was changed while she was in the hospital in 04/2016.  Advised to continue on medications as listed for now since she is not experiencing any issues.  She is in agreement.

## 2016-11-01 ENCOUNTER — Other Ambulatory Visit: Payer: Self-pay | Admitting: Cardiology

## 2016-11-04 ENCOUNTER — Other Ambulatory Visit: Payer: Self-pay | Admitting: Cardiology

## 2016-11-09 ENCOUNTER — Ambulatory Visit (INDEPENDENT_AMBULATORY_CARE_PROVIDER_SITE_OTHER): Payer: Medicare Other | Admitting: *Deleted

## 2016-11-09 DIAGNOSIS — I495 Sick sinus syndrome: Secondary | ICD-10-CM | POA: Diagnosis not present

## 2016-11-09 NOTE — Progress Notes (Signed)
Remote pacemaker transmission.   

## 2016-11-11 ENCOUNTER — Encounter: Payer: Self-pay | Admitting: Cardiology

## 2016-11-11 LAB — CUP PACEART REMOTE DEVICE CHECK
Battery Remaining Longevity: 168 mo
Battery Remaining Percentage: 100 %
Brady Statistic RA Percent Paced: 71 %
Brady Statistic RV Percent Paced: 2 %
Date Time Interrogation Session: 20180910091200
Implantable Lead Implant Date: 20171026
Implantable Lead Implant Date: 20171026
Implantable Lead Location: 753859
Implantable Lead Location: 753860
Implantable Lead Model: 7740
Implantable Lead Model: 7741
Implantable Lead Serial Number: 685941
Implantable Lead Serial Number: 818382
Implantable Pulse Generator Implant Date: 20171026
Lead Channel Impedance Value: 705 Ohm
Lead Channel Impedance Value: 982 Ohm
Lead Channel Pacing Threshold Amplitude: 0.7 V
Lead Channel Pacing Threshold Amplitude: 1 V
Lead Channel Pacing Threshold Pulse Width: 0.4 ms
Lead Channel Pacing Threshold Pulse Width: 0.4 ms
Lead Channel Setting Pacing Amplitude: 2 V
Lead Channel Setting Pacing Amplitude: 2.4 V
Lead Channel Setting Pacing Pulse Width: 0.4 ms
Lead Channel Setting Sensing Sensitivity: 2.5 mV
Pulse Gen Serial Number: 766992

## 2016-11-19 ENCOUNTER — Telehealth: Payer: Self-pay | Admitting: Cardiology

## 2016-11-19 NOTE — Telephone Encounter (Signed)
New message     Pt is concerned that she has stopped her blood thinners a year ago and her blood seems dark red and really thick please call her to discuss this

## 2016-11-19 NOTE — Telephone Encounter (Signed)
attempted to call pt however call was send to VM but mail box is full.  Pt called back while I was typing is note.  Reviewed with her the reason she is not taking anticoagulatants at this time is d/t her HX of GI bleed.  She states understanding.  Also advised you can not just look at your blood to know if it is too think or too thin.  She states understanding and will discuss with Dr Anne Fu at her next office visit.

## 2016-12-16 ENCOUNTER — Encounter: Payer: Self-pay | Admitting: Cardiology

## 2016-12-30 ENCOUNTER — Ambulatory Visit (INDEPENDENT_AMBULATORY_CARE_PROVIDER_SITE_OTHER): Payer: Medicare Other | Admitting: Cardiology

## 2016-12-30 ENCOUNTER — Encounter: Payer: Self-pay | Admitting: Cardiology

## 2016-12-30 VITALS — BP 114/60 | HR 60 | Ht 61.0 in | Wt 176.6 lb

## 2016-12-30 DIAGNOSIS — Z950 Presence of cardiac pacemaker: Secondary | ICD-10-CM

## 2016-12-30 DIAGNOSIS — R002 Palpitations: Secondary | ICD-10-CM | POA: Diagnosis not present

## 2016-12-30 DIAGNOSIS — D689 Coagulation defect, unspecified: Secondary | ICD-10-CM

## 2016-12-30 DIAGNOSIS — I480 Paroxysmal atrial fibrillation: Secondary | ICD-10-CM

## 2016-12-30 MED ORDER — METOPROLOL TARTRATE 50 MG PO TABS: 50.0000 mg | ORAL_TABLET | Freq: Every day | ORAL | 3 refills | Status: DC

## 2016-12-30 MED ORDER — PANTOPRAZOLE SODIUM 40 MG PO TBEC: 40.0000 mg | DELAYED_RELEASE_TABLET | Freq: Every day | ORAL | 3 refills | Status: DC

## 2016-12-30 MED ORDER — ATORVASTATIN CALCIUM 20 MG PO TABS: 20.0000 mg | ORAL_TABLET | Freq: Every day | ORAL | 3 refills | Status: DC

## 2016-12-30 NOTE — Patient Instructions (Signed)
Medication Instructions:  Please take Furosemide 20 mg twice a day for 3 days then go to 20 mg once a day. Continue all other medications as listed.  Follow-Up: Follow up in 3 months with Lori GerhardNorma Fredricksont, NP.  Follow up in 6 months with Dr. Anne FuSkains.  You will receive a letter in the mail 2 months before you are due.  Please call us when you receive this letter to schedule your follow up appointment.  If you need a refill on your cardiac medications before your next appointment, please call your pharmacy.  Thank you for choosing [facility]!!

## 2016-12-30 NOTE — Progress Notes (Signed)
[address] [place], [place]  [address] Phone: [phone] Fax:  [phone]  Date:  12/30/2016   ID:  Gabrielle [name], Gabrielle [name] 07-26-1945, MRN [id number]  PCP:  Gabrielle Screws, MD   History of Present Illness: Gabrielle [name] is a 71 y.o. female  with coronary artery disease status post bypass surgery in 2010, paroxysmal atrial fibrillation, tachycardia-bradycardia syndrome with pacemaker placement, EF 45-50%. COPD, obesity who unfortunately was hospitalized multiple times in 2017 with significant lower GI bleed, bright red blood, hematochezia with hemoglobin of 4 requiring a total of 6 units of packed red blood cells, 4 during initial admission and 2 during a repeat admission after she had hypotension and more blood loss. Dr. Randa Evens GI. Friable mucosa noted on colonoscopy. Presumed diverticular bleed. She has been off of her Eliquis since then. Please see below for full details.  She had prolonged QT as well, stopped Celexa and sotalol.  [name] twin.  06/30/16-she was hospitalized again in March with delirium, taking Xanax, muscle relaxants, had UTI as well. Hemoglobin remained 8.5 on discharge. 2 low for anticoagulation. See below. No chest pain, baseline shortness of breath noted.  12/30/16 -She was hospitalized for shortness of breath on 08/19/16.  BNP was 1100.  Chest x-ray showed pulmonary vascular congestion.  IV Lasix significantly improved symptoms.  Discharged with 40 mg twice a day.  The exacerbation was likely tipped off by increasing Gatorade use.  Her ejection fraction was 55% previously. Her 73 year old Poodle "Zizh" died.  She has been drinking more Gatorade.  She has not been taking her Lasix.  Weight is up 8- 10pounds.   Wt Readings from Last 3 Encounters:  12/30/16 176 lb 9.6 oz (80.1 kg)  10/23/16 164 lb 3.2 oz (74.5 kg)  10/14/16 176 lb 6.4 oz (80 kg)     Past Medical History:  Diagnosis Date  . Anemia   . Arthritis   . Asthma   . Atrial  fibrillation with controlled ventricular response (HCC) 11/2009   a. not on anticoagulation due to recurrent GI bleeding while on Eliquis.  . Chronic bronchitis   . Chronic diastolic CHF (congestive heart failure) (HCC)    a. Echo 12/2015: EF 55-60% w/ Grade 3 DD  . Chronic headache   . COPD (chronic obstructive pulmonary disease) (HCC)   . Depression   . Heart attack (HCC)   . Hyperlipidemia   . Hypertension     Past Surgical History:  Procedure Laterality Date  . CAD-CABG     x7, brief post-op Atrial Fib  . CESAREAN SECTION     x2  . COLONOSCOPY WITH PROPOFOL N/A 10/24/2015   Procedure: COLONOSCOPY WITH PROPOFOL;  Surgeon: Carman Ching, MD;  Location: [facility] ENDOSCOPY;  Service: Endoscopy;  Laterality: N/A;  . CORONARY ARTERY BYPASS GRAFT  2011  . EP IMPLANTABLE DEVICE N/A 12/26/2015   Procedure: Pacemaker Implant;  Surgeon: Marinus Maw, MD;  Location: [facility] INVASIVE CV LAB;  Service: Cardiovascular;  Laterality: N/A;  . ESOPHAGOGASTRODUODENOSCOPY N/A 10/22/2015   Procedure: ESOPHAGOGASTRODUODENOSCOPY (EGD);  Surgeon: Carman Ching, MD;  Location: [facility] ENDOSCOPY;  Service: Endoscopy;  Laterality: N/A;  . ESOPHAGOGASTRODUODENOSCOPY N/A 12/24/2015   Procedure: ESOPHAGOGASTRODUODENOSCOPY (EGD);  Surgeon: Carman Ching, MD;  Location: [facility] ENDOSCOPY;  Service: Endoscopy;  Laterality: N/A;  . JOINT REPLACEMENT Bilateral   . TONSILLECTOMY    . TOTAL KNEE ARTHROPLASTY      Current Outpatient Prescriptions  Medication Sig Dispense Refill  .  albuterol (PROVENTIL HFA;VENTOLIN HFA) 108 (90 Base) MCG/ACT inhaler Inhale 2 puffs into the lungs every 6 (six) hours as needed for wheezing or shortness of breath.    . ALPRAZolam (XANAX) 0.5 MG tablet Take 0.5 mg by mouth 2 (two) times daily.  1  . atorvastatin (LIPITOR) 20 MG tablet Take 1 tablet (20 mg total) by mouth daily. 90 tablet 3  . budesonide-formoterol (SYMBICORT) 160-4.5 MCG/ACT inhaler Inhale 2 puffs into the lungs 2 (two) times daily. 10.2  g 6  . citalopram (CELEXA) 20 MG tablet Take 20 mg by mouth daily.  3  . clobetasol cream (TEMOVATE) 0.05 % Apply 1 application topically 2 (two) times daily. Affected skin    . COSENTYX SENSOREADY 300 DOSE 150 MG/ML SOAJ as directed.     . docusate sodium (COLACE) 100 MG capsule Take 100 mg by mouth 2 (two) times daily.     . fluticasone (FLONASE) 50 MCG/ACT nasal spray Place 2 sprays into both nostrils daily. 16 g 0  . furosemide (LASIX) 20 MG tablet Take 20 mg by mouth daily. May take 1 extra tablet daily as needed for swelling    . metoprolol tartrate (LOPRESSOR) 50 MG tablet Take 1 tablet (50 mg total) by mouth at bedtime. May take an additional 50 mg (one tablet) as needed for palpitations. 120 tablet 3  . pantoprazole (PROTONIX) 40 MG tablet Take 40 mg by mouth daily before breakfast.     . potassium chloride SA (K-DUR,KLOR-CON) 20 MEQ tablet Take 1 tablet (20 mEq total) by mouth daily. 30 tablet 0  . ranitidine (ZANTAC) 150 MG tablet Take 1 tablet (150 mg total) by mouth at bedtime.    Marland Kitchen. tiZANidine (ZANAFLEX) 4 MG tablet Take 4 mg by mouth at bedtime as needed for muscle spasms.  4  . traMADol (ULTRAM) 50 MG tablet Take by mouth. Take one tablet every 6 hours as needed for pain     No current facility-administered medications for this visit.     Allergies:    Allergies  Allergen Reactions  . Novocain [Procaine] Hives and Palpitations  . Penicillins Hives and Other (See Comments)    Has patient had a PCN reaction causing immediate rash, facial/tongue/throat swelling, SOB or lightheadedness with hypotension: No Has patient had a PCN reaction causing severe rash involving mucus membranes or skin necrosis: No Has patient had a PCN reaction that required hospitalization No Has patient had a PCN reaction occurring within the last 10 years: No If all of the above answers are "NO", then may proceed with Cephalosporin use.    Social History:  The patient  reports that she has been smoking  Cigarettes.  She has been smoking about 0.20 packs per day. She has never used smokeless tobacco. She reports that she does not drink alcohol or use drugs.   Family History  Problem Relation Age of Onset  . Diabetes Mother   . Hypertension Mother   . Heart disease Mother        before age 73  . Heart disease Father        before age 73  . Hypertension Father   . Heart attack Father   . Lung cancer Maternal Grandfather   . Lung cancer Paternal Grandfather   . Hypertension Daughter   . Stroke Paternal Grandmother     ROS:  Poor eyesight noted. Unless specified above all other review of systems negative.  PHYSICAL EXAM: VS:  BP 114/60   Pulse 60  Ht 5\' 1"  (1.549 m)   Wt 176 lb 9.6 oz (80.1 kg)   LMP  (LMP Unknown)   SpO2 97%   BMI 33.37 kg/m  GEN: Well nourished, well developed, in no acute distress  HEENT: normal  Neck: no JVD, carotid bruits, or masses Cardiac: RRR; no murmurs, rubs, or gallops, 1+ bilateral lower extremity edema  Respiratory:  clear to auscultation bilaterally, normal work of breathing GI: soft, nontender, nondistended, + BS MS: no deformity or atrophy  Skin: warm and dry, no rash Neuro:  Alert and Oriented x 3, Strength and sensation are intact Psych: euthymic mood, full affect   EKG:  No EKG today-Prior 01/07/15-sinus bradycardia rate 54 with frequent PVCs, low amplitude P wave, poor R-wave progression, T-wave inversion in 1 and aVL. 07/04/14-sinus rhythm, 72, nonspecific ST-T wave changes -prior 02/07/14-sinus rhythm, 61, inferior infarct pattern, nonspecific ST-T wave changes. Poor R-wave progression.   Labs: Hospital labs reviewed.  ECHO:05/17/16:  - Left ventricle: The cavity size was mildly dilated. Systolic   function was normal. The estimated ejection fraction was 55%.   There is akinesis of the apical myocardium. There is akinesis of   the basalinferior myocardium. There is akinesis of the   apicalanterior myocardium. There was a reduced  contribution of   atrial contraction to ventricular filling, due to increased   ventricular diastolic pressure or atrial contractile dysfunction.   Doppler parameters are consistent with a reversible restrictive   pattern, indicative of decreased left ventricular diastolic   compliance and/or increased left atrial pressure (grade 3   diastolic dysfunction). Doppler parameters are consistent with   high ventricular filling pressure. - Aortic valve: There was mild regurgitation. - Mitral valve: Severely calcified annulus. There was moderate   regurgitation directed posteriorly. Mean gradient (D): 7 mm Hg. - Left atrium: The atrium was mildly dilated. - Right ventricle: Pacer wire or catheter noted in right ventricle. - Right atrium: The atrium was mildly dilated. - Tricuspid valve: There was moderate regurgitation. - Pulmonic valve: There was mild regurgitation. - Pulmonary arteries: PA peak pressure: 59 mm Hg (S).  Impressions:  - The right ventricular systolic pressure was increased consistent   with moderate pulmonary hypertension.  ASSESSMENT AND PLAN:   Paroxysmal atrial fibrillation  - Last ECG on 05/16/16 shows sinus rhythm.  - She has been off of sotalol.  - Pacemaker previously placed because of bradycardia.  -Stable  Chronic anti-coagulation  - Previously on Eliquis but she had severe bleeding, 4 units of blood then repeat admission again with 2 units of blood with hemoglobin of 4 on admit. Friable tissue on colonoscopy. Recurrent hematochezia.  - Last hemoglobin is 8.5  - I think the risks of resuming anticoagulation outweigh the benefit.  - She understands that she is at risk for stroke. She also has had delirium as well.  No changes today.  Coronary artery disease  - Post CABG, stable, no angina, stable.  Sick sinus syndrome-pacemaker placement  - Stable.  Doing well.  COPD  -  Per primary team  - Continue to encourage smoking cessation.  Smoking  again.  Chronic diastolic heart failure  -She seems to be fluid up about 10 pounds.  Encouraged her take her Lasix 20 mg twice a day for 3 days then 20 mg a day thereafter.  Encouraged her to stop drinking Gatorade.  Psoriasis  - Methotrexate, Xeloda    Signed, Donato Schultz, MD [facility]  12/30/2016 3:08 PM

## 2017-01-06 ENCOUNTER — Telehealth: Payer: Self-pay | Admitting: Cardiology

## 2017-01-06 NOTE — Telephone Encounter (Signed)
Attempted to contact pt about referring a PCP.  No answer and VM is full.

## 2017-01-06 NOTE — Telephone Encounter (Signed)
New message  Pt verbalized that she want Dr.Skains to recommend her a PCP

## 2017-01-08 ENCOUNTER — Other Ambulatory Visit: Payer: Self-pay | Admitting: Cardiology

## 2017-01-08 MED ORDER — FUROSEMIDE 20 MG PO TABS: 20.0000 mg | ORAL_TABLET | Freq: Every day | ORAL | 3 refills | Status: DC

## 2017-01-08 NOTE — Telephone Encounter (Signed)
Patient Instructions by Sharin GraveFleming, Pamela J, RN at 12/30/2016 2:40 PM   Author: Sharin GraveFleming, Pamela J, RN Author Type: Registered Nurse Filed: 12/30/2016 3:22 PM  Note Status: Signed Cosign: Cosign Not Required Encounter Date: 12/30/2016  Editor: Sharin GraveFleming, Pamela J, RN (Registered Nurse)    Medication Instructions:  Please take Furosemide 20 mg twice a day for 3 days then go to 20 mg once a day. Continue all other medications as listed.

## 2017-01-08 NOTE — Telephone Encounter (Signed)
Medication Detail    Disp Refills Start End   metoprolol tartrate (LOPRESSOR) 50 MG tablet 120 tablet 3 12/30/2016 03/30/2017   Sig - Route: Take 1 tablet (50 mg total) by mouth at bedtime. May take an additional 50 mg (one tablet) as needed for palpitations. - Oral   Sent to pharmacy as: metoprolol tartrate (LOPRESSOR) 50 MG tablet   E-Prescribing Status: Receipt confirmed by pharmacy (12/30/2016 3:20 PM EDT)   Associated Diagnoses   Palpitations     Pharmacy   [id number] - [place], [place] - [address]

## 2017-01-08 NOTE — Addendum Note (Signed)
Addended by: Valrie HartISLEY, Kallon Caylor L on: 01/08/2017 05:05 PM   Modules accepted: Orders

## 2017-01-08 NOTE — Telephone Encounter (Signed)
Attempted to call pt again.  Unable to leave message because voicemail is full.

## 2017-01-11 NOTE — Telephone Encounter (Signed)
Have been unable to reach patient by phone.  Will await a return call from her.

## 2017-02-08 ENCOUNTER — Ambulatory Visit (INDEPENDENT_AMBULATORY_CARE_PROVIDER_SITE_OTHER): Payer: Medicare Other | Admitting: *Deleted

## 2017-02-08 DIAGNOSIS — I495 Sick sinus syndrome: Secondary | ICD-10-CM | POA: Diagnosis not present

## 2017-02-08 NOTE — Progress Notes (Signed)
Remote pacemaker transmission.   

## 2017-02-12 ENCOUNTER — Encounter: Payer: Self-pay | Admitting: Cardiology

## 2017-02-13 LAB — CUP PACEART REMOTE DEVICE CHECK
Battery Remaining Longevity: 180 mo
Battery Remaining Percentage: 100 %
Brady Statistic RA Percent Paced: 69 %
Brady Statistic RV Percent Paced: 1 %
Date Time Interrogation Session: 20181210082800
Implantable Lead Implant Date: 20171026
Implantable Lead Implant Date: 20171026
Implantable Lead Location: 753859
Implantable Lead Location: 753860
Implantable Lead Model: 7740
Implantable Lead Model: 7741
Implantable Lead Serial Number: 685941
Implantable Lead Serial Number: 818382
Implantable Pulse Generator Implant Date: 20171026
Lead Channel Impedance Value: 723 Ohm
Lead Channel Impedance Value: 957 Ohm
Lead Channel Pacing Threshold Amplitude: 0.7 V
Lead Channel Pacing Threshold Amplitude: 1 V
Lead Channel Pacing Threshold Pulse Width: 0.4 ms
Lead Channel Pacing Threshold Pulse Width: 0.4 ms
Lead Channel Setting Pacing Amplitude: 2 V
Lead Channel Setting Pacing Amplitude: 2.4 V
Lead Channel Setting Pacing Pulse Width: 0.4 ms
Lead Channel Setting Sensing Sensitivity: 2.5 mV
Pulse Gen Serial Number: 766992

## 2017-03-31 ENCOUNTER — Ambulatory Visit: Payer: Medicare Other | Admitting: Nurse Practitioner

## 2017-04-23 ENCOUNTER — Encounter: Payer: Self-pay | Admitting: Cardiology

## 2017-04-23 ENCOUNTER — Ambulatory Visit (INDEPENDENT_AMBULATORY_CARE_PROVIDER_SITE_OTHER): Payer: Medicare Other | Admitting: Cardiology

## 2017-04-23 VITALS — BP 124/82 | HR 60 | Ht 62.5 in | Wt 199.1 lb

## 2017-04-23 DIAGNOSIS — Z950 Presence of cardiac pacemaker: Secondary | ICD-10-CM

## 2017-04-23 DIAGNOSIS — I495 Sick sinus syndrome: Secondary | ICD-10-CM | POA: Diagnosis not present

## 2017-04-23 NOTE — Patient Instructions (Signed)
Medication Instructions:  The current medical regimen is effective;  continue present plan and medications.  Follow-Up: Follow up in 6 months with Laura Ingold, PA.  You will receive a letter in the mail 2 months before you are due.  Please call us when you receive this letter to schedule your follow up appointment.  Follow up in 1 year with Dr. Skains.  You will receive a letter in the mail 2 months before you are due.  Please call us when you receive this letter to schedule your follow up appointment.  If you need a refill on your cardiac medications before your next appointment, please call your pharmacy.  Thank you for choosing [facility]!!     

## 2017-04-23 NOTE — Progress Notes (Signed)
[address] [place], [place]  [address] Phone: [phone] Fax:  [phone]  Date:  04/23/2017   ID:  [name], Gabrielle Feb 03, 1946, MRN [id number]  PCP:  Henrine Screws, MD   History of Present Illness: Gabrielle [name] is a 72 y.o. female  with coronary artery disease status post bypass surgery in 2010, paroxysmal atrial fibrillation, tachycardia-bradycardia syndrome with pacemaker placement, EF 45-50%. COPD, obesity who unfortunately was hospitalized multiple times in 2017 with significant lower GI bleed, bright red blood, hematochezia with hemoglobin of 4 requiring a total of 6 units of packed red blood cells, 4 during initial admission and 2 during a repeat admission after she had hypotension and more blood loss. Dr. Randa Evens GI. Friable mucosa noted on colonoscopy. Presumed diverticular bleed. She has been off of her Eliquis since then. Please see below for full details.  She had prolonged QT as well, previously stopped Celexa and sotalol. Now back on Celexa  [place] twin.  06/30/16-she was hospitalized again in March with delirium, taking Xanax, muscle relaxants, had UTI as well. Hemoglobin remained 8.5 on discharge. 2 low for anticoagulation. See below. No chest pain, baseline shortness of breath noted.  12/30/16 -She was hospitalized for shortness of breath on 08/19/16.  BNP was 1100.  Chest x-ray showed pulmonary vascular congestion.  IV Lasix significantly improved symptoms.  Discharged with 40 mg twice a day.  The exacerbation was likely tipped off by increasing Gatorade use.  Her ejection fraction was 55% previously. Her 74 year old Poodle "Zizh" died.  She has been drinking more Gatorade.  She has not been taking her Lasix.  Weight is up 8- 10pounds.  04/23/17 -She was quite depressed being off the antidepressants which were stopped previously because of prolonged QT.  Now she is back on Celexa.  She has been off of the sotalol because of prolonged QT.   No chest pain, no syncope.  She has had almost a 40 pound weight gain after taking a psoriasis medication.  She wakes up and eats peanut butter and jelly sandwiches and drinks lemonade.  Wt Readings from Last 3 Encounters:  04/23/17 199 lb 1.9 oz (90.3 kg)  12/30/16 176 lb 9.6 oz (80.1 kg)  10/23/16 164 lb 3.2 oz (74.5 kg)     Past Medical History:  Diagnosis Date  . Anemia   . Arthritis   . Asthma   . Atrial fibrillation with controlled ventricular response (HCC) 11/2009   a. not on anticoagulation due to recurrent GI bleeding while on Eliquis.  . Chronic bronchitis   . Chronic diastolic CHF (congestive heart failure) (HCC)    a. Echo 12/2015: EF 55-60% w/ Grade 3 DD  . Chronic headache   . COPD (chronic obstructive pulmonary disease) (HCC)   . Depression   . Heart attack (HCC)   . Hyperlipidemia   . Hypertension     Past Surgical History:  Procedure Laterality Date  . CAD-CABG     x7, brief post-op Atrial Fib  . CESAREAN SECTION     x2  . COLONOSCOPY WITH PROPOFOL N/A 10/24/2015   Procedure: COLONOSCOPY WITH PROPOFOL;  Surgeon: Carman Ching, MD;  Location: [facility] ENDOSCOPY;  Service: Endoscopy;  Laterality: N/A;  . CORONARY ARTERY BYPASS GRAFT  2011  . EP IMPLANTABLE DEVICE N/A 12/26/2015   Procedure: Pacemaker Implant;  Surgeon: Marinus Maw, MD;  Location: [facility] INVASIVE CV LAB;  Service: Cardiovascular;  Laterality: N/A;  . ESOPHAGOGASTRODUODENOSCOPY  N/A 10/22/2015   Procedure: ESOPHAGOGASTRODUODENOSCOPY (EGD);  Surgeon: Carman ChingJames Edwards, MD;  [facility];  Service: Endoscopy;  Laterality: N/A;  . ESOPHAGOGASTRODUODENOSCOPY N/A 12/24/2015   Procedure: ESOPHAGOGASTRODUODENOSCOPY (EGD);  Surgeon: Carman ChingJames Edwards, MD;  [facility];  Service: Endoscopy;  Laterality: N/A;  . JOINT REPLACEMENT Bilateral   . TONSILLECTOMY    . TOTAL KNEE ARTHROPLASTY      Current Outpatient Medications  Medication Sig Dispense Refill  . albuterol (PROVENTIL HFA;VENTOLIN HFA)  108 (90 Base) MCG/ACT inhaler Inhale 2 puffs into the lungs every 6 (six) hours as needed for wheezing or shortness of breath.    . ALPRAZolam (XANAX) 0.5 MG tablet Take 0.5 mg by mouth 2 (two) times daily.  1  . atorvastatin (LIPITOR) 20 MG tablet Take 1 tablet (20 mg total) by mouth daily. 90 tablet 3  . budesonide-formoterol (SYMBICORT) 160-4.5 MCG/ACT inhaler Inhale 2 puffs into the lungs 2 (two) times daily. 10.2 g 6  . citalopram (CELEXA) 20 MG tablet Take 20 mg by mouth daily.  3  . clobetasol cream (TEMOVATE) 0.05 % Apply 1 application topically 2 (two) times daily. Affected skin    . COSENTYX SENSOREADY 300 DOSE 150 MG/ML SOAJ as directed.     . docusate sodium (COLACE) 100 MG capsule Take 100 mg by mouth 2 (two) times daily.     . fluticasone (FLONASE) 50 MCG/ACT nasal spray Place 2 sprays into both nostrils daily. 16 g 0  . furosemide (LASIX) 20 MG tablet Take 1 tablet (20 mg total) daily by mouth. 90 tablet 3  . pantoprazole (PROTONIX) 40 MG tablet Take 1 tablet (40 mg total) by mouth daily before breakfast. 90 tablet 3  . potassium chloride SA (K-DUR,KLOR-CON) 20 MEQ tablet Take 1 tablet (20 mEq total) by mouth daily. 30 tablet 0  . ranitidine (ZANTAC) 150 MG tablet Take 1 tablet (150 mg total) by mouth at bedtime.    . sucralfate (CARAFATE) 1 g tablet Take 1 tablet by mouth 4 (four) times daily.  0  . tiZANidine (ZANAFLEX) 4 MG tablet Take 4 mg by mouth at bedtime as needed for muscle spasms.  4  . traMADol (ULTRAM) 50 MG tablet Take by mouth. Take one tablet every 6 hours as needed for pain    . metoprolol tartrate (LOPRESSOR) 50 MG tablet Take 1 tablet (50 mg total) by mouth at bedtime. May take an additional 50 mg (one tablet) as needed for palpitations. 120 tablet 3   No current facility-administered medications for this visit.     Allergies:    Allergies  Allergen Reactions  . Novocain [Procaine] Hives and Palpitations  . Penicillins Hives and Other (See Comments)    Has  patient had a PCN reaction causing immediate rash, facial/tongue/throat swelling, SOB or lightheadedness with hypotension: No Has patient had a PCN reaction causing severe rash involving mucus membranes or skin necrosis: No Has patient had a PCN reaction that required hospitalization No Has patient had a PCN reaction occurring within the last 10 years: No If all of the above answers are "NO", then may proceed with Cephalosporin use.    Social History:  The patient  reports that she has been smoking cigarettes.  She has been smoking about 0.20 packs per day. she has never used smokeless tobacco. She reports that she does not drink alcohol or use drugs.   Family History  Problem Relation Age of Onset  . Diabetes Mother   . Hypertension Mother   .  Heart disease Mother        before age 44  . Heart disease Father        before age 9  . Hypertension Father   . Heart attack Father   . Lung cancer Maternal Grandfather   . Lung cancer Paternal Grandfather   . Hypertension Daughter   . Stroke Paternal Grandmother     ROS:  Poor eyesight noted. All other ROS negative.   PHYSICAL EXAM: VS:  BP 124/82   Pulse 60   Ht 5' 2.5" (1.588 m)   Wt 199 lb 1.9 oz (90.3 kg)   LMP  (LMP Unknown)   SpO2 96%   BMI 35.84 kg/m  GEN: Well nourished, well developed, in no acute distress, obese HEENT: normal  Neck: no JVD, carotid bruits, or masses Cardiac: RRR; no murmurs, rubs, or gallops,no edema  Respiratory:  clear to auscultation bilaterally, normal work of breathing GI: soft, nontender, nondistended, + BS MS: no deformity or atrophy  Skin: warm and dry, no rash Neuro:  Alert and Oriented x 3, Strength and sensation are intact Psych: euthymic mood, full affect    EKG:  No EKG today-Prior 01/07/15-sinus bradycardia rate 54 with frequent PVCs, low amplitude P wave, poor R-wave progression, T-wave inversion in 1 and aVL. 07/04/14-sinus rhythm, 72, nonspecific ST-T wave changes -prior 02/07/14-sinus  rhythm, 61, inferior infarct pattern, nonspecific ST-T wave changes. Poor R-wave progression.   Labs: Hospital labs reviewed.  ECHO:05/17/16:  - Left ventricle: The cavity size was mildly dilated. Systolic   function was normal. The estimated ejection fraction was 55%.   There is akinesis of the apical myocardium. There is akinesis of   the basalinferior myocardium. There is akinesis of the   apicalanterior myocardium. There was a reduced contribution of   atrial contraction to ventricular filling, due to increased   ventricular diastolic pressure or atrial contractile dysfunction.   Doppler parameters are consistent with a reversible restrictive   pattern, indicative of decreased left ventricular diastolic   compliance and/or increased left atrial pressure (grade 3   diastolic dysfunction). Doppler parameters are consistent with   high ventricular filling pressure. - Aortic valve: There was mild regurgitation. - Mitral valve: Severely calcified annulus. There was moderate   regurgitation directed posteriorly. Mean gradient (D): 7 mm Hg. - Left atrium: The atrium was mildly dilated. - Right ventricle: Pacer wire or catheter noted in right ventricle. - Right atrium: The atrium was mildly dilated. - Tricuspid valve: There was moderate regurgitation. - Pulmonic valve: There was mild regurgitation. - Pulmonary arteries: PA peak pressure: 59 mm Hg (S).  Impressions:  - The right ventricular systolic pressure was increased consistent   with moderate pulmonary hypertension.  ASSESSMENT AND PLAN:   Paroxysmal atrial fibrillation  - Last ECG on 05/16/16 shows sinus rhythm.  - She has been off of sotalol.  - Pacemaker previously placed because of bradycardia.  -Stable. Doing well.   Chronic anti-coagulation  - Previously on Eliquis but she had severe bleeding, 4 units of blood then repeat admission again with 2 units of blood with hemoglobin of 4 on admit. Friable tissue on  colonoscopy. Recurrent hematochezia.  - Last hemoglobin is 8.5  - I think the risks of resuming anticoagulation outweigh the benefit.  - She understands that she is at risk for stroke. She also has had delirium as well.  No changes today.  Coronary artery disease  - Post CABG, stable, no angina, stable.  Sick sinus syndrome-pacemaker  placement  - Stable.  Doing well. ECG noted.   COPD  -  Per primary team  - Continue to encourage smoking cessation.  Smoking again. No change.   Chronic diastolic heart failure  -She seems to be fluid up about 10 pounds.  Encouraged her take her Lasix 20 mg twice a day for 3 days then 20 mg a day thereafter.  Encouraged her to stop drinking Gatorade.  Psoriasis  - Xeloda.     Signed, Donato Schultz, MD [facility]  04/23/2017 5:20 PM

## 2017-04-26 NOTE — Addendum Note (Signed)
Addended by: Sharin GraveFLEMING, Venezia Sargeant J on: 04/26/2017 02:20 PM   Modules accepted: Orders

## 2017-05-10 ENCOUNTER — Ambulatory Visit (INDEPENDENT_AMBULATORY_CARE_PROVIDER_SITE_OTHER): Payer: Medicare Other | Admitting: *Deleted

## 2017-05-10 DIAGNOSIS — I495 Sick sinus syndrome: Secondary | ICD-10-CM

## 2017-05-10 NOTE — Progress Notes (Signed)
Remote pacemaker transmission.   

## 2017-05-12 ENCOUNTER — Encounter: Payer: Self-pay | Admitting: Cardiology

## 2017-05-29 LAB — CUP PACEART REMOTE DEVICE CHECK
Battery Remaining Longevity: 174 mo
Battery Remaining Percentage: 100 %
Brady Statistic RA Percent Paced: 67 %
Brady Statistic RV Percent Paced: 1 %
Date Time Interrogation Session: 20190311102900
Implantable Lead Implant Date: 20171026
Implantable Lead Implant Date: 20171026
Implantable Lead Location: 753859
Implantable Lead Location: 753860
Implantable Lead Model: 7740
Implantable Lead Model: 7741
Implantable Lead Serial Number: 685941
Implantable Lead Serial Number: 818382
Implantable Pulse Generator Implant Date: 20171026
Lead Channel Impedance Value: 742 Ohm
Lead Channel Impedance Value: 904 Ohm
Lead Channel Pacing Threshold Amplitude: 0.6 V
Lead Channel Pacing Threshold Amplitude: 1 V
Lead Channel Pacing Threshold Pulse Width: 0.4 ms
Lead Channel Pacing Threshold Pulse Width: 0.4 ms
Lead Channel Setting Pacing Amplitude: 2 V
Lead Channel Setting Pacing Amplitude: 2.4 V
Lead Channel Setting Pacing Pulse Width: 0.4 ms
Lead Channel Setting Sensing Sensitivity: 2.5 mV
Pulse Gen Serial Number: 766992

## 2017-08-09 ENCOUNTER — Ambulatory Visit (INDEPENDENT_AMBULATORY_CARE_PROVIDER_SITE_OTHER): Payer: Medicare Other | Admitting: *Deleted

## 2017-08-09 DIAGNOSIS — I495 Sick sinus syndrome: Secondary | ICD-10-CM

## 2017-08-09 NOTE — Progress Notes (Signed)
Remote pacemaker transmission.   

## 2017-08-10 ENCOUNTER — Encounter: Payer: Self-pay | Admitting: Cardiology

## 2017-09-03 LAB — CUP PACEART REMOTE DEVICE CHECK
Battery Remaining Longevity: 174 mo
Battery Remaining Percentage: 100 %
Brady Statistic RA Percent Paced: 64 %
Brady Statistic RV Percent Paced: 1 %
Date Time Interrogation Session: 20190610095000
Implantable Lead Implant Date: 20171026
Implantable Lead Implant Date: 20171026
Implantable Lead Location: 753859
Implantable Lead Location: 753860
Implantable Lead Model: 7740
Implantable Lead Model: 7741
Implantable Lead Serial Number: 685941
Implantable Lead Serial Number: 818382
Implantable Pulse Generator Implant Date: 20171026
Lead Channel Impedance Value: 713 Ohm
Lead Channel Impedance Value: 836 Ohm
Lead Channel Pacing Threshold Amplitude: 0.7 V
Lead Channel Pacing Threshold Amplitude: 1 V
Lead Channel Pacing Threshold Pulse Width: 0.4 ms
Lead Channel Pacing Threshold Pulse Width: 0.4 ms
Lead Channel Setting Pacing Amplitude: 2 V
Lead Channel Setting Pacing Amplitude: 2.4 V
Lead Channel Setting Pacing Pulse Width: 0.4 ms
Lead Channel Setting Sensing Sensitivity: 2.5 mV
Pulse Gen Serial Number: 766992

## 2017-10-27 ENCOUNTER — Encounter: Payer: Self-pay | Admitting: Nurse Practitioner

## 2017-10-27 ENCOUNTER — Ambulatory Visit (INDEPENDENT_AMBULATORY_CARE_PROVIDER_SITE_OTHER): Payer: Medicare Other | Admitting: Nurse Practitioner

## 2017-10-27 ENCOUNTER — Other Ambulatory Visit: Payer: Self-pay | Admitting: Family Medicine

## 2017-10-27 VITALS — BP 142/80 | HR 66 | Ht 62.5 in | Wt 190.1 lb

## 2017-10-27 DIAGNOSIS — I2583 Coronary atherosclerosis due to lipid rich plaque: Secondary | ICD-10-CM

## 2017-10-27 DIAGNOSIS — I480 Paroxysmal atrial fibrillation: Secondary | ICD-10-CM

## 2017-10-27 DIAGNOSIS — I495 Sick sinus syndrome: Secondary | ICD-10-CM | POA: Diagnosis not present

## 2017-10-27 DIAGNOSIS — Z950 Presence of cardiac pacemaker: Secondary | ICD-10-CM | POA: Diagnosis not present

## 2017-10-27 DIAGNOSIS — I2510 Atherosclerotic heart disease of native coronary artery without angina pectoris: Secondary | ICD-10-CM

## 2017-10-27 DIAGNOSIS — I5032 Chronic diastolic (congestive) heart failure: Secondary | ICD-10-CM | POA: Diagnosis not present

## 2017-10-27 DIAGNOSIS — R002 Palpitations: Secondary | ICD-10-CM

## 2017-10-27 DIAGNOSIS — Z1231 Encounter for screening mammogram for malignant neoplasm of breast: Secondary | ICD-10-CM

## 2017-10-27 MED ORDER — FUROSEMIDE 20 MG PO TABS: 20.0000 mg | ORAL_TABLET | Freq: Every day | ORAL | 3 refills | Status: DC

## 2017-10-27 MED ORDER — PANTOPRAZOLE SODIUM 40 MG PO TBEC: 40.0000 mg | DELAYED_RELEASE_TABLET | Freq: Every day | ORAL | 3 refills | Status: DC

## 2017-10-27 MED ORDER — POTASSIUM CHLORIDE CRYS ER 20 MEQ PO TBCR: 20.0000 meq | EXTENDED_RELEASE_TABLET | Freq: Every day | ORAL | 3 refills | Status: DC

## 2017-10-27 MED ORDER — METOPROLOL TARTRATE 50 MG PO TABS: 50.0000 mg | ORAL_TABLET | Freq: Every day | ORAL | 3 refills | Status: DC

## 2017-10-27 MED ORDER — ATORVASTATIN CALCIUM 20 MG PO TABS: 20.0000 mg | ORAL_TABLET | Freq: Every day | ORAL | 3 refills | Status: DC

## 2017-10-27 NOTE — Progress Notes (Signed)
CARDIOLOGY OFFICE NOTE  Date:  10/27/2017    Gabrielle [name] Date of Birth: [birth year] Medical Record #[id number]  PCP:  Henrine Screws, MD  Cardiologist:  Rick Duff    Chief Complaint  Patient presents with  . Coronary Artery Disease  . Atrial Fibrillation    6 month check - seen for Dr. Anne Fu    History of Present Illness: Gabrielle [name] is a 72 y.o. female who presents today for a 6 month check. Seen for Dr. Anne Fu.   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 She has had prolonged QT - previously on Celexa and sotalol - now just on low dose Celexa.   She was admitted in March of 2018 with delirium in the setting of Xanax, muscle relaxers and a UTI. Admitted in June with CHF - had been using Gatorade. Last seen here in February - depressed since being off Celexa (had had prolonged QT with Sotalol and Celexa). Was back on low dose Celexa. Had gained weight - poor eating habits.   Comes in today. Here alone. She says she is no "just no longer able to eat anything". She says she has been throwing up. Last time was about 2 weeks ago - lasted 3 weeks and then had some chest pain. Thinks that might have been from eating too much though. Her weight however continues to climb. She likes chocolate. Asking for her depression meds, Xanax and Tramadol. Does need some other refills for her cardiac medicines. She does smoke - about a pack a week. She had her pneumonia shot yesterday. She brings me labs from May - these are reviewed and look good. She has chronic back pain - sometimes to the point she has to use a walker. She has used some Tylenol arthritis with good results. She has used some extra Lopressor for palpitations. She has gotten her a new cat - this has made her happy.   Past Medical History:  Diagnosis Date  . Anemia   .  Arthritis   . Asthma   . Atrial fibrillation with controlled ventricular response (HCC) 11/2009   a. not on anticoagulation due to recurrent GI bleeding while on Eliquis.  . Chronic bronchitis   . Chronic diastolic CHF (congestive heart failure) (HCC)    a. Echo 12/2015: EF 55-60% w/ Grade 3 DD  . Chronic headache   . COPD (chronic obstructive pulmonary disease) (HCC)   . Depression   . Heart attack (HCC)   . Hyperlipidemia   . Hypertension     Past Surgical History:  Procedure Laterality Date  . CAD-CABG     x7, brief post-op Atrial Fib  . CESAREAN SECTION     x2  . COLONOSCOPY WITH PROPOFOL N/A 10/24/2015   Procedure: COLONOSCOPY WITH PROPOFOL;  Surgeon: Carman Ching, MD;  Location: [facility] ENDOSCOPY;  Service: Endoscopy;  Laterality: N/A;  . CORONARY ARTERY BYPASS GRAFT  2011  . EP IMPLANTABLE DEVICE N/A 12/26/2015   Procedure: Pacemaker Implant;  Surgeon: Marinus Maw, MD;  Location: [facility] INVASIVE CV LAB;  Service: Cardiovascular;  Laterality: N/A;  . ESOPHAGOGASTRODUODENOSCOPY N/A 10/22/2015   Procedure: ESOPHAGOGASTRODUODENOSCOPY (EGD);  Surgeon: Carman Ching, MD;  Location: [facility] ENDOSCOPY;  Service: Endoscopy;  Laterality: N/A;  . ESOPHAGOGASTRODUODENOSCOPY N/A 12/24/2015   Procedure: ESOPHAGOGASTRODUODENOSCOPY (EGD);  Surgeon: Carman Ching, MD;  Location: [facility] ENDOSCOPY;  Service: Endoscopy;  Laterality: N/A;  . JOINT REPLACEMENT Bilateral   . TONSILLECTOMY    . TOTAL KNEE ARTHROPLASTY       Medications: Current Meds  Medication Sig  . Acetaminophen (TYLENOL 8 HOUR ARTHRITIS PAIN PO) Take 2 tablets by mouth 2 (two) times daily. Back pain  . albuterol (PROVENTIL HFA;VENTOLIN HFA) 108 (90 Base) MCG/ACT inhaler Inhale 2 puffs into the lungs every 6 (six) hours as needed for wheezing or shortness of breath.  . ALPRAZolam (XANAX) 0.5 MG tablet Take 0.5 mg by mouth 2 (two) times daily.  Marland Kitchen atorvastatin (LIPITOR) 20 MG tablet Take 1 tablet (20 mg total) by mouth daily.  .  budesonide-formoterol (SYMBICORT) 160-4.5 MCG/ACT inhaler Inhale 2 puffs into the lungs 2 (two) times daily.  . citalopram (CELEXA) 20 MG tablet Take 20 mg by mouth daily.  . clobetasol cream (TEMOVATE) 0.05 % Apply 1 application topically 2 (two) times daily. Affected skin  . COSENTYX SENSOREADY 300 DOSE 150 MG/ML SOAJ as directed.   . docusate sodium (COLACE) 100 MG capsule Take 100 mg by mouth 2 (two) times daily.   . fluticasone (FLONASE) 50 MCG/ACT nasal spray Place 2 sprays into both nostrils daily.  . furosemide (LASIX) 20 MG tablet Take 1 tablet (20 mg total) by mouth daily.  . metoprolol tartrate (LOPRESSOR) 50 MG tablet Take 1 tablet (50 mg total) by mouth at bedtime. May take an additional 50 mg (one tablet) as needed for palpitations.  . pantoprazole (PROTONIX) 40 MG tablet Take 1 tablet (40 mg total) by mouth daily before breakfast.  . potassium chloride SA (K-DUR,KLOR-CON) 20 MEQ tablet Take 1 tablet (20 mEq total) by mouth daily.  . ranitidine (ZANTAC) 150 MG tablet Take 1 tablet (150 mg total) by mouth at bedtime.  . sucralfate (CARAFATE) 1 g tablet Take 1 tablet by mouth 4 (four) times daily.  Marland Kitchen tiZANidine (ZANAFLEX) 4 MG tablet Take 4 mg by mouth at bedtime as needed for muscle spasms.  . [DISCONTINUED] atorvastatin (LIPITOR) 20 MG tablet Take 1 tablet (20 mg total) by mouth daily.  . [DISCONTINUED] furosemide (LASIX) 20 MG tablet Take 1 tablet (20 mg total) daily by mouth.  . [DISCONTINUED] metoprolol tartrate (LOPRESSOR) 50 MG tablet Take 1 tablet (50 mg total) by mouth at bedtime. May take an additional 50 mg (one tablet) as needed for palpitations.  . [DISCONTINUED] pantoprazole (PROTONIX) 40 MG tablet Take 1 tablet (40 mg total) by mouth daily before breakfast.  . [DISCONTINUED] potassium chloride SA (K-DUR,KLOR-CON) 20 MEQ tablet Take 1 tablet (20 mEq total) by mouth daily.  . [DISCONTINUED] traMADol (ULTRAM) 50 MG tablet Take by mouth. Take one tablet every 6 hours as  needed for pain     Allergies: Allergies  Allergen Reactions  . Novocain [Procaine] Hives and Palpitations  . Penicillins Hives and Other (See Comments)    Has patient had a PCN reaction causing immediate rash, facial/tongue/throat swelling, SOB or lightheadedness with hypotension: No Has patient had a PCN reaction causing severe rash involving mucus membranes or skin necrosis: No Has patient had a PCN reaction that required hospitalization No Has patient had a PCN reaction occurring within the last 10 years: No If all of the above answers are "NO", then may proceed with Cephalosporin use.    Social History: The patient  reports that she has been smoking cigarettes. She has been smoking about 0.20 packs per day. She has never used  smokeless tobacco. She reports that she does not drink alcohol or use drugs.   Family History: The patient's family history includes Diabetes in her mother; Heart attack in her father; Heart disease in her father and mother; Hypertension in her daughter, father, and mother; Lung cancer in her maternal grandfather and paternal grandfather; Stroke in her paternal grandmother.   Review of Systems: Please see the history of present illness.   Otherwise, the review of systems is positive for none.   All other systems are reviewed and negative.   Physical Exam: VS:  BP (!) 142/80 (BP Location: Left Arm, Patient Position: Sitting, Cuff Size: Normal)   Pulse 66   Ht 5' 2.5" (1.588 m)   Wt 190 lb 1.9 oz (86.2 kg)   LMP  (LMP Unknown)   BMI 34.22 kg/m  .  BMI Body mass index is 34.22 kg/m.  Wt Readings from Last 3 Encounters:  10/27/17 190 lb 1.9 oz (86.2 kg)  04/23/17 199 lb 1.9 oz (90.3 kg)  12/30/16 176 lb 9.6 oz (80.1 kg)    General: Alert. She is in no acute distress.  She has gained more weight. She smells of tobacco.  HEENT: Normal.  Neck: Supple, no JVD, carotid bruits, or masses noted.  Cardiac: Regular rate and rhythm. Soft outflow murmur. No  edema.  Respiratory:  Lungs are clear to auscultation bilaterally with normal work of breathing.  GI: Soft and nontender.  MS: No deformity or atrophy. Gait and ROM intact.  Skin: Warm and dry. Color is normal.  Neuro:  Strength and sensation are intact and no gross focal deficits noted.  Psych: Alert, appropriate and with normal affect.   LABORATORY DATA:  EKG:  EKG is ordered today. This demonstrates A pacing.QT is 426 ms  Lab Results  Component Value Date   WBC 10.8 09/28/2016   HGB 13.4 09/28/2016   HCT 39.3 09/28/2016   PLT 147 (L) 09/28/2016   GLUCOSE 105 (H) 09/28/2016   CHOL  11/28/2009    112        ATP III CLASSIFICATION:  <200     mg/dL   Desirable  409-811  mg/dL   Borderline High  >=914    mg/dL   High          TRIG 99 11/28/2009   HDL 52 11/28/2009   LDLCALC  11/28/2009    40        Total Cholesterol/HDL:CHD Risk Coronary Heart Disease Risk Table                     Men   Women  1/2 Average Risk   3.4   3.3  Average Risk       5.0   4.4  2 X Average Risk   9.6   7.1  3 X Average Risk  23.4   11.0        Use the calculated Patient Ratio above and the CHD Risk Table to determine the patient's CHD Risk.        ATP III CLASSIFICATION (LDL):  <100     mg/dL   Optimal  782-956  mg/dL   Near or Above                    Optimal  130-159  mg/dL   Borderline  213-086  mg/dL   High  >578     mg/dL   Very High   ALT 10 46/96/2952  AST 29 05/26/2016   NA 146 (H) 09/28/2016   K 3.8 09/28/2016   CL 103 09/28/2016   CREATININE 0.88 09/28/2016   BUN 7 (L) 09/28/2016   CO2 27 09/28/2016   TSH 0.644 05/13/2016   INR 2.01 10/23/2015   HGBA1C (H) 11/27/2009    6.1 (NOTE)                                                                       According to the ADA Clinical Practice Recommendations for 2011, when HbA1c is used as a screening test:   >=6.5%   Diagnostic of Diabetes Mellitus           (if abnormal result  is confirmed)  5.7-6.4%   Increased risk of  developing Diabetes Mellitus  References:Diagnosis and Classification of Diabetes Mellitus,Diabetes Care,2011,34(Suppl 1):S62-S69 and Standards of Medical Care in         Diabetes - 2011,Diabetes Care,2011,34  (Suppl 1):S11-S61.       BNP (last 3 results) No results for input(s): BNP in the last 8760 hours.  ProBNP (last 3 results) No results for input(s): PROBNP in the last 8760 hours.   Other Studies Reviewed Today:  ECHO:05/17/16:  - Left ventricle: The cavity size was mildly dilated. Systolic function was normal. The estimated ejection fraction was 55%. There is akinesis of the apical myocardium. There is akinesis of the basalinferior myocardium. There is akinesis of the apicalanterior myocardium. There was a reduced contribution of atrial contraction to ventricular filling, due to increased ventricular diastolic pressure or atrial contractile dysfunction. Doppler parameters are consistent with a reversible restrictive pattern, indicative of decreased left ventricular diastolic compliance and/or increased left atrial pressure (grade 3 diastolic dysfunction). Doppler parameters are consistent with high ventricular filling pressure. - Aortic valve: There was mild regurgitation. - Mitral valve: Severely calcified annulus. There was moderate regurgitation directed posteriorly. Mean gradient (D): 7 mm Hg. - Left atrium: The atrium was mildly dilated. - Right ventricle: Pacer wire or catheter noted in right ventricle. - Right atrium: The atrium was mildly dilated. - Tricuspid valve: There was moderate regurgitation. - Pulmonic valve: There was mild regurgitation. - Pulmonary arteries: PA peak pressure: 59 mm Hg (S).  Impressions:  - The right ventricular systolic pressure was increased consistent with moderate pulmonary hypertension.   Assessment/Plan:  1. PAF  - looks to be in NSR by EKG today. HR is stable. No changes made.   2. Prior GI  bleed - no longer on anticoagulation  3. CAD - remote CABG  4. Underlying PPM for bradycardia - seeing EP in November.   5. Depression - back on low dose Celexa - QT is ok today.   6. Chronic diastolic HF - she has had weight gain - I think this is all calorie related. She has no shortness of breath or swelling.   7. Palpitations - does use extra beta blocker prn.   Current medicines are reviewed with the patient today.  The patient does not have concerns regarding medicines other than what has been noted above.  The following changes have been made:  See above.  Labs/ tests ordered today include:    Orders Placed This Encounter  Procedures  . EKG 12-Lead  Disposition:   FU with Dr. Anne Fu in 6 months. See EP as planned.    Patient is agreeable to this plan and will call if any problems develop in the interim.   SignedNorma Fredrickson, NP  10/27/2017 12:26 PM  [facility] [address] [place], [place]  [address] Phone: [phone] Fax: [phone]

## 2017-10-27 NOTE — Patient Instructions (Signed)
We will be checking the following labs today - NONE   Medication Instructions:    Continue with your current medicines.     Testing/Procedures To Be Arranged:  N/A  Follow-Up:   See Dr. Skains in 6 months    Other Special Instructions:   N/A    If you need a refill on your cardiac medications before your next appointment, please call your pharmacy.   Call the [facility] office at [phone] if you have any questions, problems or concerns.      

## 2017-11-08 ENCOUNTER — Ambulatory Visit (INDEPENDENT_AMBULATORY_CARE_PROVIDER_SITE_OTHER): Payer: Medicare Other | Admitting: *Deleted

## 2017-11-08 ENCOUNTER — Encounter: Payer: Self-pay | Admitting: Cardiology

## 2017-11-08 DIAGNOSIS — I495 Sick sinus syndrome: Secondary | ICD-10-CM

## 2017-11-08 NOTE — Progress Notes (Signed)
Remote pacemaker transmission.   

## 2017-11-19 ENCOUNTER — Ambulatory Visit: Payer: Medicare Other

## 2017-11-27 LAB — CUP PACEART REMOTE DEVICE CHECK
Battery Remaining Longevity: 162 mo
Battery Remaining Percentage: 100 %
Brady Statistic RA Percent Paced: 63 %
Brady Statistic RV Percent Paced: 1 %
Date Time Interrogation Session: 20190909062200
Implantable Lead Implant Date: 20171026
Implantable Lead Implant Date: 20171026
Implantable Lead Location: 753859
Implantable Lead Location: 753860
Implantable Lead Model: 7740
Implantable Lead Model: 7741
Implantable Lead Serial Number: 685941
Implantable Lead Serial Number: 818382
Implantable Pulse Generator Implant Date: 20171026
Lead Channel Impedance Value: 747 Ohm
Lead Channel Impedance Value: 941 Ohm
Lead Channel Pacing Threshold Amplitude: 0.7 V
Lead Channel Pacing Threshold Amplitude: 1 V
Lead Channel Pacing Threshold Pulse Width: 0.4 ms
Lead Channel Pacing Threshold Pulse Width: 0.4 ms
Lead Channel Setting Pacing Amplitude: 2 V
Lead Channel Setting Pacing Amplitude: 2.4 V
Lead Channel Setting Pacing Pulse Width: 0.4 ms
Lead Channel Setting Sensing Sensitivity: 2.5 mV
Pulse Gen Serial Number: 766992

## 2017-12-14 ENCOUNTER — Ambulatory Visit: Payer: Medicare Other

## 2018-01-07 ENCOUNTER — Encounter: Payer: Medicare Other | Admitting: Internal Medicine

## 2018-01-10 ENCOUNTER — Encounter: Payer: Self-pay | Admitting: Internal Medicine

## 2018-01-17 ENCOUNTER — Encounter: Payer: Self-pay | Admitting: Internal Medicine

## 2018-01-19 ENCOUNTER — Telehealth: Payer: Self-pay | Admitting: Cardiology

## 2018-01-19 NOTE — Telephone Encounter (Addendum)
Pt reports SOB worsening over the last month. States that she has had to use her 2 inhalers more in the last week.  Denies seeing/speaking w/ her pulmonologist about this concern.  Having a hard time w/ ADLs d/t SOB.  States she can't go the kitchen/bathroom w/o having to sit down to catch her breath. Also reports ankle edema, but denies weight gain. Denies CP, fatigue, palpitations, dizziness. Scheduler scheduled pt to see Dr. Anne FuSkains tomorrow morning.  Pt has PPM but transmission not received since beginning of October.  Pt advised to send in manual transmission for us to review to see if AFib burden could be a factor. She is aware we will call back once transmission received.

## 2018-01-19 NOTE — Telephone Encounter (Signed)
Pt c/o Shortness Of Breath: STAT if SOB developed within the last 24 hours or pt is noticeably SOB on the phone  1. Are you currently SOB (can you hear that pt is SOB on the phone)? Yes  2. How long have you been experiencing SOB? 4 days  3. Are you SOB when sitting or when up moving around? All the time  4. Are you currently experiencing any other symptoms?  Pacemaker talked to her today,she does not know what it said

## 2018-01-20 ENCOUNTER — Ambulatory Visit (INDEPENDENT_AMBULATORY_CARE_PROVIDER_SITE_OTHER): Payer: Medicare Other | Admitting: Cardiology

## 2018-01-20 ENCOUNTER — Encounter: Payer: Self-pay | Admitting: Cardiology

## 2018-01-20 ENCOUNTER — Encounter (INDEPENDENT_AMBULATORY_CARE_PROVIDER_SITE_OTHER): Payer: Self-pay

## 2018-01-20 VITALS — BP 144/80 | HR 60 | Ht 62.5 in | Wt 213.8 lb

## 2018-01-20 DIAGNOSIS — I5032 Chronic diastolic (congestive) heart failure: Secondary | ICD-10-CM | POA: Diagnosis not present

## 2018-01-20 DIAGNOSIS — J438 Other emphysema: Secondary | ICD-10-CM

## 2018-01-20 DIAGNOSIS — I2583 Coronary atherosclerosis due to lipid rich plaque: Secondary | ICD-10-CM

## 2018-01-20 DIAGNOSIS — I495 Sick sinus syndrome: Secondary | ICD-10-CM

## 2018-01-20 DIAGNOSIS — I480 Paroxysmal atrial fibrillation: Secondary | ICD-10-CM

## 2018-01-20 DIAGNOSIS — I2510 Atherosclerotic heart disease of native coronary artery without angina pectoris: Secondary | ICD-10-CM | POA: Diagnosis not present

## 2018-01-20 DIAGNOSIS — Z950 Presence of cardiac pacemaker: Secondary | ICD-10-CM

## 2018-01-20 DIAGNOSIS — D689 Coagulation defect, unspecified: Secondary | ICD-10-CM

## 2018-01-20 NOTE — Patient Instructions (Signed)
Medication Instructions:  Please take Furosemide 40 mg for 3 days then go back to 20 mg a day. Continue all other medications as listed.  If you need a refill on your cardiac medications before your next appointment, please call your pharmacy.   Follow-Up: At [facility], you and your health needs are our priority.  As part of our continuing mission to provide you with exceptional heart care, we have created designated Provider Care Teams.  These Care Teams include your primary Cardiologist (physician) and Advanced Practice Providers (APPs -  Physician Assistants and Nurse Practitioners) who all work together to provide you with the care you need, when you need it. You will need a follow up appointment in 6 months.  Please call our office 2 months in advance to schedule this appointment.  You may see Donato SchultzMark Skains, MD or one of the following Advanced Practice Providers on your designated Care Team:   Norma FredricksonLori Gerhardt, NP Nada BoozerLaura Ingold, NP . Georgie ChardJill McDaniel, NP  Thank you for choosing [facility]!!

## 2018-01-20 NOTE — Progress Notes (Signed)
Cardiology Office Note:    Date:  01/21/2018   ID:  Gabrielle, [name] 09-04-1945, MRN [id number]  PCP:  Gabrielle Screws, MD  Cardiologist:  Gabrielle Schultz, MD  Electrophysiologist:  None   Referring MD: Gabrielle Screws, MD     History of Present Illness:    Gabrielle [name] is a 72 y.o. female here for evaluation of shortness of breath. Increased use of inhalers. Has not seen pulmonary. NYHA 3. Sit down to catch breath. No CP, no syncope. She is starting to feel better.   In review of old notes: coronary artery disease status post bypass surgery in 2010, paroxysmal atrial fibrillation, tachycardia-bradycardia syndrome with pacemaker placement, EF 45-50%. COPD, obesity who unfortunately was hospitalized multiple times in 2017 with significant lower GI bleed, bright red blood, hematochezia with hemoglobin of 4 requiring a total of 6 units of packed red blood cells, 4 during initial admission and 2 during a repeat admission after she had hypotension and more blood loss. Dr. Randa [name] GI. Friable mucosa noted on colonoscopy. Presumed diverticular bleed. She has been off of her Eliquis since then. Please see below for full details.  She had prolonged QT as well, previously stopped Celexa and sotalol. Now back on Celexa  [place] twin.  06/30/16-she was hospitalized again in March with delirium, taking Xanax, muscle relaxants, had UTI as well. Hemoglobin remained 8.5 on discharge. 2 low for anticoagulation. See below. No chest pain, baseline shortness of breath noted.  12/30/16 -She was hospitalized for shortness of breath on 08/19/16.  BNP was 1100.  Chest x-ray showed pulmonary vascular congestion.  IV Lasix significantly improved symptoms.  Discharged with 40 mg twice a day.  The exacerbation was likely tipped off by increasing Gatorade use.  Her ejection fraction was 55% previously. Her 74 year old Poodle "Zizh" died.  She has been drinking more Gatorade.  She has not been taking her Lasix.   Weight is up 8- 10pounds.  04/23/17 -She was quite depressed being off the antidepressants which were stopped previously because of prolonged QT.  Now she is back on Celexa.  She has been off of the sotalol because of prolonged QT.  No chest pain, no syncope.  She has had almost a 40 pound weight gain after taking a psoriasis medication.  She wakes up and eats peanut butter and jelly sandwiches and drinks lemonade.     Past Medical History:  Diagnosis Date  . Anemia   . Arthritis   . Asthma   . Atrial fibrillation with controlled ventricular response (HCC) 11/2009   a. not on anticoagulation due to recurrent GI bleeding while on Eliquis.  . Chronic bronchitis   . Chronic diastolic CHF (congestive heart failure) (HCC)    a. Echo 12/2015: EF 55-60% w/ Grade 3 DD  . Chronic headache   . COPD (chronic obstructive pulmonary disease) (HCC)   . Depression   . Heart attack (HCC)   . Hyperlipidemia   . Hypertension     Past Surgical History:  Procedure Laterality Date  . CAD-CABG     x7, brief post-op Atrial Fib  . CESAREAN SECTION     x2  . COLONOSCOPY WITH PROPOFOL N/A 10/24/2015   Procedure: COLONOSCOPY WITH PROPOFOL;  Surgeon: Gabrielle Ching, MD;  Location: [facility] ENDOSCOPY;  Service: Endoscopy;  Laterality: N/A;  . CORONARY ARTERY BYPASS GRAFT  2011  . EP IMPLANTABLE DEVICE N/A 12/26/2015   Procedure: Pacemaker Implant;  Surgeon: Gabrielle Maw, MD;  Location: [facility]  INVASIVE CV LAB;  Service: Cardiovascular;  Laterality: N/A;  . ESOPHAGOGASTRODUODENOSCOPY N/A 10/22/2015   Procedure: ESOPHAGOGASTRODUODENOSCOPY (EGD);  Surgeon: Gabrielle Ching, MD;  Location: [facility] ENDOSCOPY;  Service: Endoscopy;  Laterality: N/A;  . ESOPHAGOGASTRODUODENOSCOPY N/A 12/24/2015   Procedure: ESOPHAGOGASTRODUODENOSCOPY (EGD);  Surgeon: Gabrielle Ching, MD;  Location: [facility] ENDOSCOPY;  Service: Endoscopy;  Laterality: N/A;  . JOINT REPLACEMENT Bilateral   . TONSILLECTOMY    . TOTAL KNEE ARTHROPLASTY      Current  Medications: Current Meds  Medication Sig  . Acetaminophen (TYLENOL 8 HOUR ARTHRITIS PAIN PO) Take 2 tablets by mouth 2 (two) times daily. Back pain  . albuterol (PROVENTIL HFA;VENTOLIN HFA) 108 (90 Base) MCG/ACT inhaler Inhale 2 puffs into the lungs every 6 (six) hours as needed for wheezing or shortness of breath.  . ALPRAZolam (XANAX) 0.5 MG tablet Take 0.5 mg by mouth 2 (two) times daily.  Marland Kitchen atorvastatin (LIPITOR) 20 MG tablet Take 1 tablet (20 mg total) by mouth daily.  . budesonide-formoterol (SYMBICORT) 160-4.5 MCG/ACT inhaler Inhale 2 puffs into the lungs 2 (two) times daily.  . citalopram (CELEXA) 20 MG tablet Take 20 mg by mouth daily.  . clobetasol cream (TEMOVATE) 0.05 % Apply 1 application topically 2 (two) times daily. Affected skin  . COSENTYX SENSOREADY 300 DOSE 150 MG/ML SOAJ as directed.   . docusate sodium (COLACE) 100 MG capsule Take 100 mg by mouth 2 (two) times daily.   . fluticasone (FLONASE) 50 MCG/ACT nasal spray Place 2 sprays into both nostrils daily.  . furosemide (LASIX) 20 MG tablet Take 1 tablet (20 mg total) by mouth daily.  . metoprolol tartrate (LOPRESSOR) 50 MG tablet Take 1 tablet (50 mg total) by mouth at bedtime. May take an additional 50 mg (one tablet) as needed for palpitations.  . pantoprazole (PROTONIX) 40 MG tablet Take 1 tablet (40 mg total) by mouth daily before breakfast.  . potassium chloride SA (K-DUR,KLOR-CON) 20 MEQ tablet Take 1 tablet (20 mEq total) by mouth daily.  . ranitidine (ZANTAC) 150 MG tablet Take 1 tablet (150 mg total) by mouth at bedtime.  Marland Kitchen tiZANidine (ZANAFLEX) 4 MG tablet Take 4 mg by mouth at bedtime as needed for muscle spasms.     Allergies:   Novocain [procaine] and Penicillins   Social History   Socioeconomic History  . Marital status: Divorced    Spouse name: Not on file  . Number of children: Not on file  . Years of education: Not on file  . Highest education level: Not on file  Occupational History  .  Occupation: retired Nutritional therapist: [employer]  Social Needs  . Financial resource strain: Not on file  . Food insecurity:    Worry: Not on file    Inability: Not on file  . Transportation needs:    Medical: Not on file    Non-medical: Not on file  Tobacco Use  . Smoking status: Current Some Day Smoker    Packs/day: 0.20    Types: Cigarettes  . Smokeless tobacco: Never Used  . Tobacco comment: "Occasionally I will smoke a cigarette"   Substance and Sexual Activity  . Alcohol use: No    Alcohol/week: 0.0 standard drinks  . Drug use: No  . Sexual activity: Not on file  Lifestyle  . Physical activity:    Days per week: Not on file    Minutes per session: Not on file  . Stress: Not on file  Relationships  . Social connections:  Talks on phone: Not on file    Gets together: Not on file    Attends religious service: Not on file    Active member of club or organization: Not on file    Attends meetings of clubs or organizations: Not on file    Relationship status: Not on file  Other Topics Concern  . Not on file  Social History Narrative  . Not on file     Family History: The patient's family history includes Diabetes in her mother; Heart attack in her father; Heart disease in her father and mother; Hypertension in her daughter, father, and mother; Lung cancer in her maternal grandfather and paternal grandfather; Stroke in her paternal grandmother.  ROS:   Please see the history of present illness.     All other systems reviewed and are negative.  EKGs/Labs/Other Studies Reviewed:    The following studies were reviewed today:  ECHO:05/17/16:  - Left ventricle: The cavity size was mildly dilated. Systolic function was normal. The estimated ejection fraction was 55%. There is akinesis of the apical myocardium. There is akinesis of the basalinferior myocardium. There is akinesis of the apicalanterior myocardium. There was a reduced contribution  of atrial contraction to ventricular filling, due to increased ventricular diastolic pressure or atrial contractile dysfunction. Doppler parameters are consistent with a reversible restrictive pattern, indicative of decreased left ventricular diastolic compliance and/or increased left atrial pressure (grade 3 diastolic dysfunction). Doppler parameters are consistent with high ventricular filling pressure. - Aortic valve: There was mild regurgitation. - Mitral valve: Severely calcified annulus. There was moderate regurgitation directed posteriorly. Mean gradient (D): 7 mm Hg. - Left atrium: The atrium was mildly dilated. - Right ventricle: Pacer wire or catheter noted in right ventricle. - Right atrium: The atrium was mildly dilated. - Tricuspid valve: There was moderate regurgitation. - Pulmonic valve: There was mild regurgitation. - Pulmonary arteries: PA peak pressure: 59 mm Hg (S).  Impressions:  - The right ventricular systolic pressure was increased consistent with moderate pulmonary hypertension.   EKG:  EKG is not ordered today.    Recent Labs: No results found for requested labs within last 8760 hours.  Recent Lipid Panel    Component Value Date/Time   CHOL  11/28/2009 0343    112        ATP III CLASSIFICATION:  <200     mg/dL   Desirable  161-096  mg/dL   Borderline High  >=045    mg/dL   High          TRIG 99 11/28/2009 0343   HDL 52 11/28/2009 0343   CHOLHDL 2.2 11/28/2009 0343   VLDL 20 11/28/2009 0343   LDLCALC  11/28/2009 0343    40        Total Cholesterol/HDL:CHD Risk Coronary Heart Disease Risk Table                     Men   Women  1/2 Average Risk   3.4   3.3  Average Risk       5.0   4.4  2 X Average Risk   9.6   7.1  3 X Average Risk  23.4   11.0        Use the calculated Patient Ratio above and the CHD Risk Table to determine the patient's CHD Risk.        ATP III CLASSIFICATION (LDL):  <100     mg/dL   Optimal   409-811  mg/dL   Near or Above                    Optimal  130-159  mg/dL   Borderline  161-096  mg/dL   High  >045     mg/dL   Very High    Physical Exam:    VS:  BP (!) 144/80   Pulse 60   Ht 5' 2.5" (1.588 m)   Wt 213 lb 12.8 oz (97 kg)   LMP  (LMP Unknown)   SpO2 91%   BMI 38.48 kg/m     Wt Readings from Last 3 Encounters:  01/20/18 213 lb 12.8 oz (97 kg)  10/27/17 190 lb 1.9 oz (86.2 kg)  04/23/17 199 lb 1.9 oz (90.3 kg)     GEN:  Well nourished, well developed in no acute distress HEENT: Normal NECK: No JVD; No carotid bruits LYMPHATICS: No lymphadenopathy CARDIAC: RRR, no murmurs, rubs, gallops RESPIRATORY:  Clear to auscultation without rales, wheezing or rhonchi  ABDOMEN: Soft, non-tender, non-distended MUSCULOSKELETAL:  No edema; No deformity  SKIN: Warm and dry NEUROLOGIC:  Alert and oriented x 3 PSYCHIATRIC:  Normal affect   ASSESSMENT:    1. Other emphysema (HCC)   2. Chronic diastolic heart failure (HCC)   3. Paroxysmal atrial fibrillation (HCC)   4. Coronary artery disease due to lipid rich plaque   5. Coagulopathy (HCC)   6. Pacemaker   7. Tachy-brady syndrome (HCC)    PLAN:    In order of problems listed above:  Paroxysmal atrial fibrillation  - ECG on 05/16/16 shows sinus rhythm.  - She has been off of sotalol.  - Pacemaker previously placed because of bradycardia.  Currently on auscultation appears to be in normal rhythm.    Chronic anti-coagulation Previously on Eliquis but she had severe bleeding, 4 units of blood then repeat admission again with 2 units of blood with hemoglobin of 4 on admit. Friable tissue on colonoscopy. Recurrent hematochezia.  - Prior hemoglobin is 8.5  - I think the risks of resuming anticoagulation outweigh the benefit.  - She understands that she is at risk for stroke. She also has had delirium as well.   Coronary artery disease  - Post CABG, stable, no angina.  Sick sinus syndrome-pacemaker placement  -  Stable.    COPD  -  Per primary team  - Continue to encourage smoking cessation.  Smoking again.    Certainly her shortness of breath could be related to her underlying lung condition as well.  Acute on chronic diastolic heart failure  - Encouraged her take her Lasix 20 mg twice a day for 3 days then 20 mg a day thereafter.    She does admit that she had stopped taking the medication at one point.  Perhaps if we can pull off some intravascular volume, we will help her with her breathing as well.  Psoriasis  - Xeloda.   Medication Adjustments/Labs and Tests Ordered: Current medicines are reviewed at length with the patient today.  Concerns regarding medicines are outlined above.  No orders of the defined types were placed in this encounter.  No orders of the defined types were placed in this encounter.   Patient Instructions  Medication Instructions:  Please take Furosemide 40 mg for 3 days then go back to 20 mg a day. Continue all other medications as listed.  If you need a refill on your cardiac medications before your next appointment, please call your pharmacy.  Follow-Up: At [facility], you and your health needs are our priority.  As part of our continuing mission to provide you with exceptional heart care, we have created designated Provider Care Teams.  These Care Teams include your primary Cardiologist (physician) and Advanced Practice Providers (APPs -  Physician Assistants and Nurse Practitioners) who all work together to provide you with the care you need, when you need it. You will need a follow up appointment in 6 months.  Please call our office 2 months in advance to schedule this appointment.  You may see Gabrielle SchultzMark Eesha Schmaltz, MD or one of the following Advanced Practice Providers on your designated Care Team:   Norma FredricksonLori Gerhardt, NP Nada BoozerLaura Ingold, NP . Georgie ChardJill McDaniel, NP  Thank you for choosing [facility]!!        Signed, Gabrielle SchultzMark Adeli Frost, MD  01/21/2018 4:43  PM    [facility]

## 2018-01-21 ENCOUNTER — Encounter: Payer: Self-pay | Admitting: Cardiology

## 2018-02-07 ENCOUNTER — Ambulatory Visit (INDEPENDENT_AMBULATORY_CARE_PROVIDER_SITE_OTHER): Payer: Medicare Other

## 2018-02-07 DIAGNOSIS — I495 Sick sinus syndrome: Secondary | ICD-10-CM

## 2018-02-08 NOTE — Progress Notes (Signed)
Remote pacemaker transmission.   

## 2018-03-23 IMAGING — US US ABDOMEN LIMITED
1 series · 14 of 25 positions shown · non-contrast
Comparison: Ultrasound 07/30/2015.

CLINICAL DATA: Abnormal transaminases.

EXAM:
US ABDOMEN LIMITED - RIGHT UPPER QUADRANT

[Series 1: us abdomen limited · 0.23mm/px · 14 of 39 slices shown]
[im 1/39]
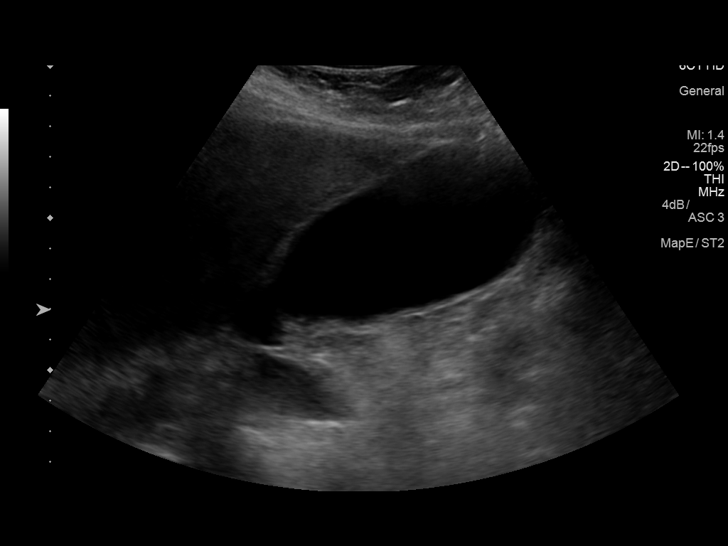
[im 4/39]
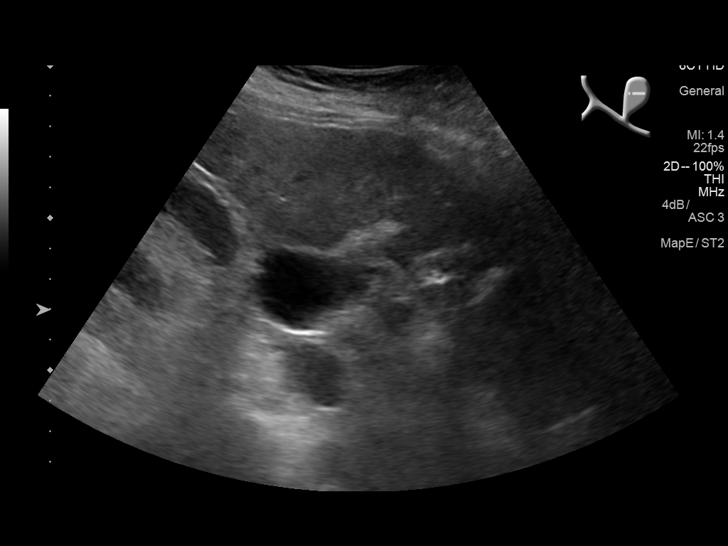
[im 7/39]
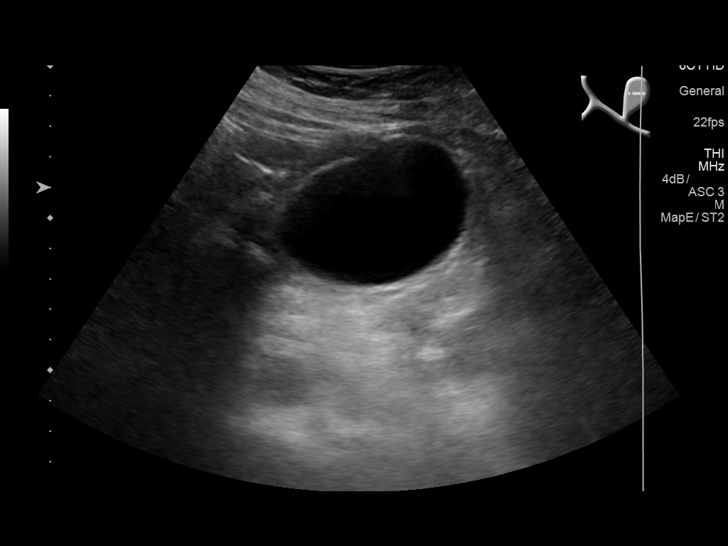
[im 10/39]
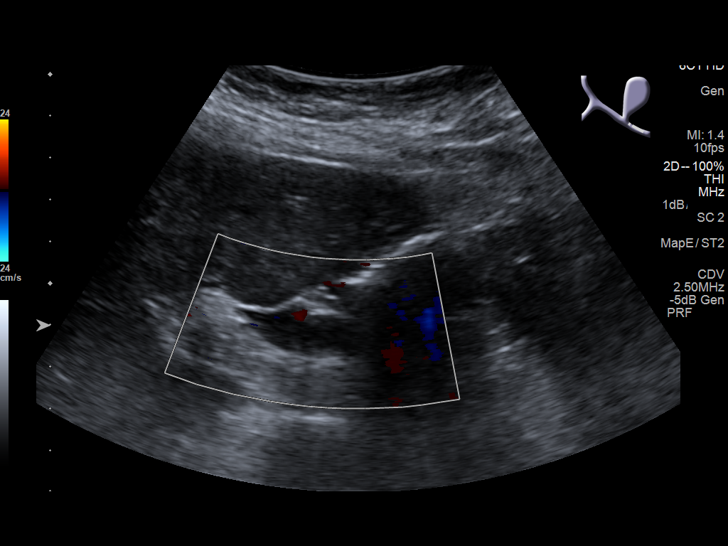
[im 13/39]
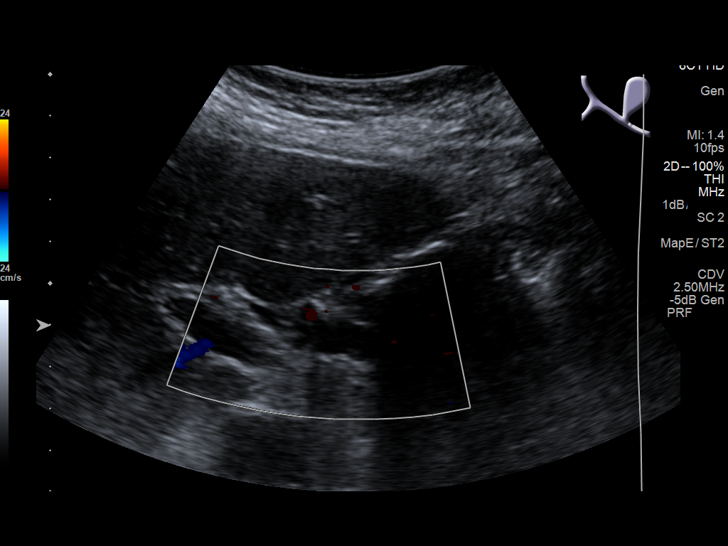
[im 15/39]
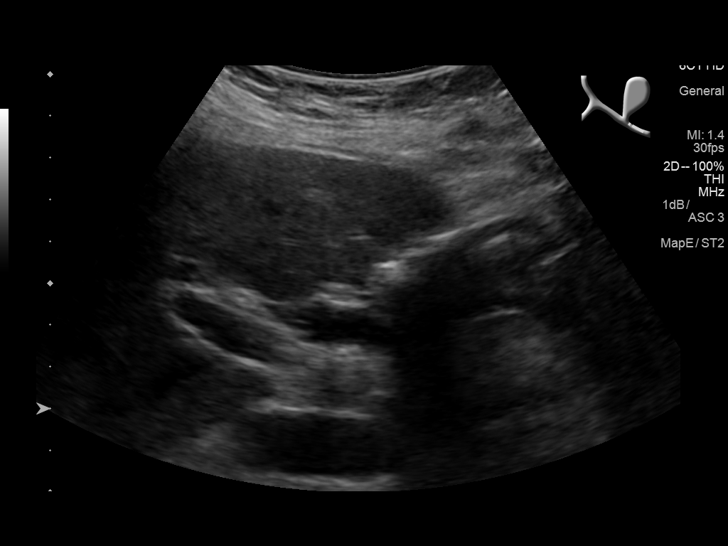
[im 18/39]
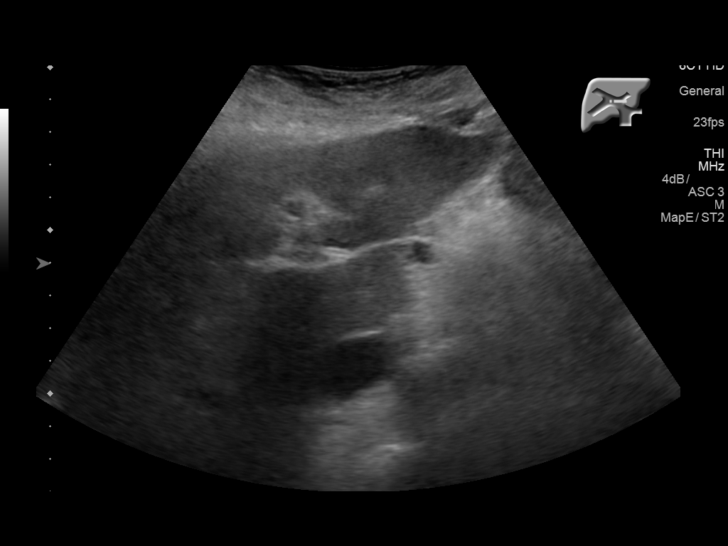
[im 21/39]
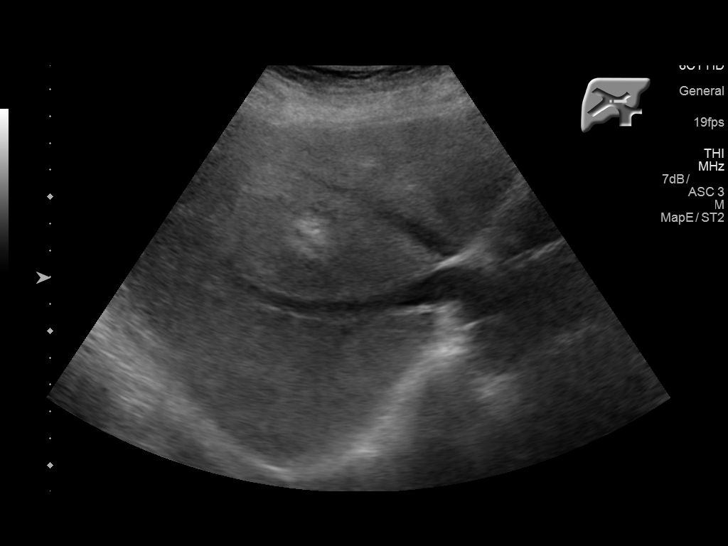
[im 24/39]
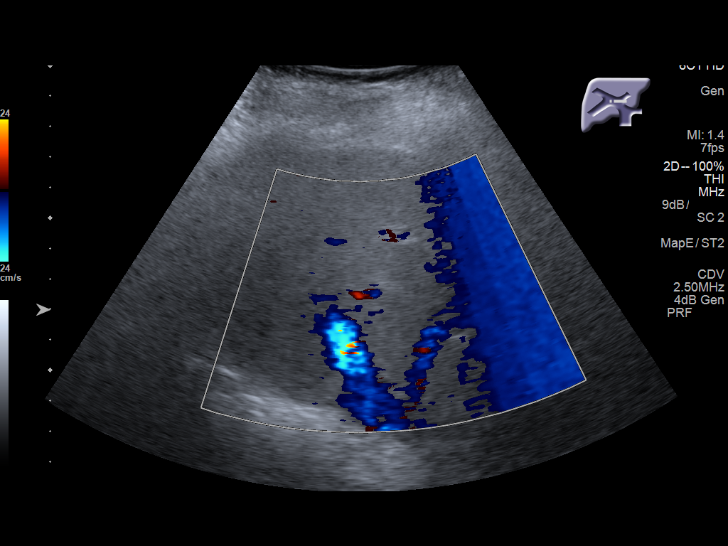
[im 26/39]
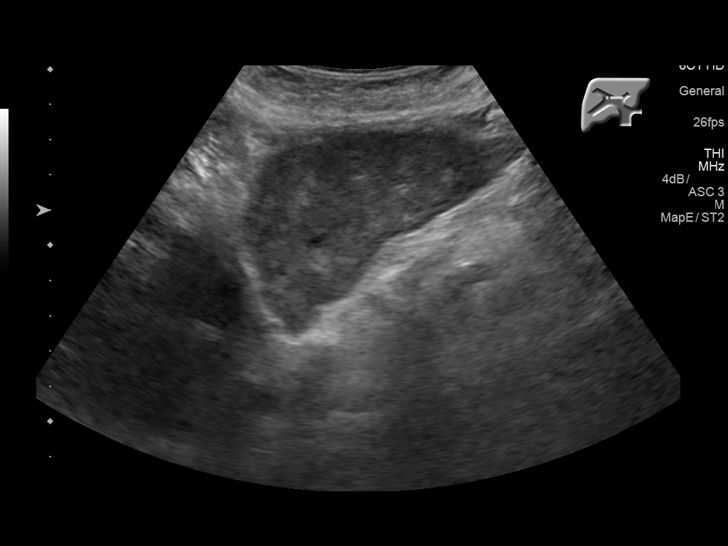
[im 29/39]
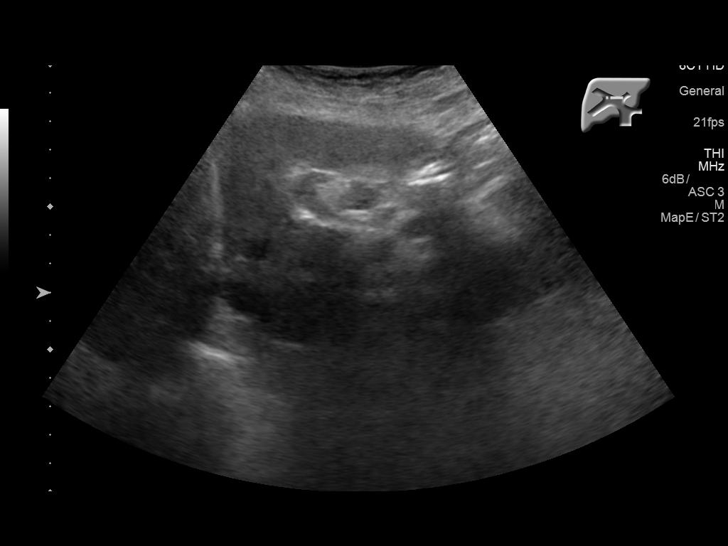
[im 32/39]
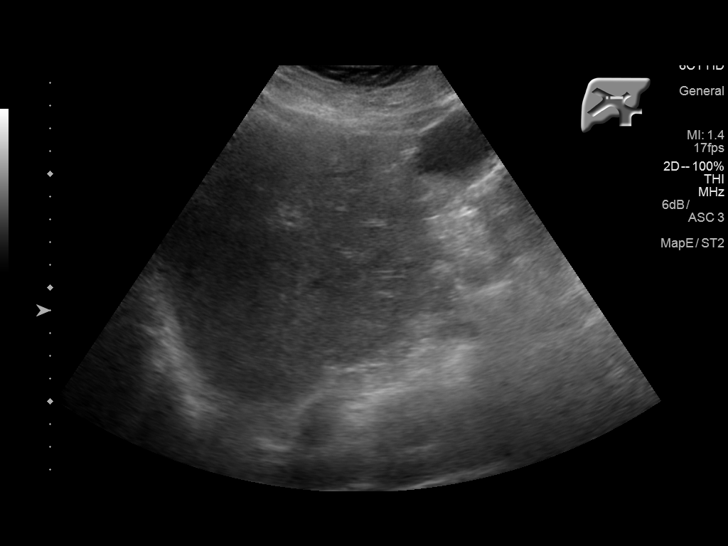
[im 35/39]
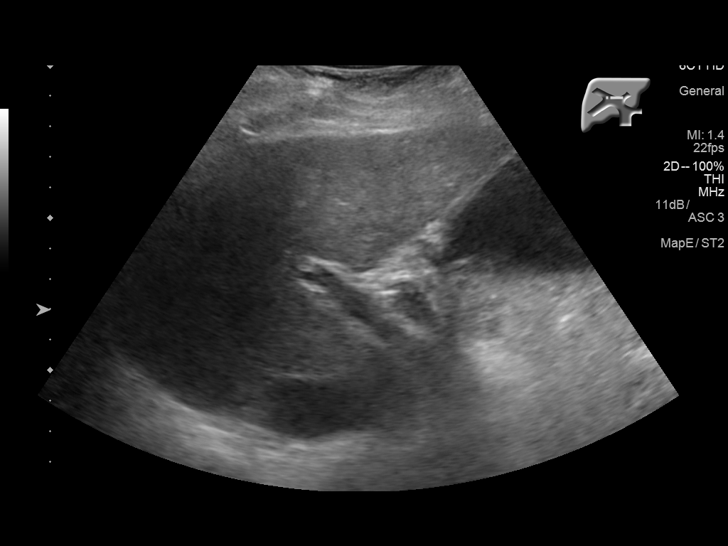
[im 39/39]
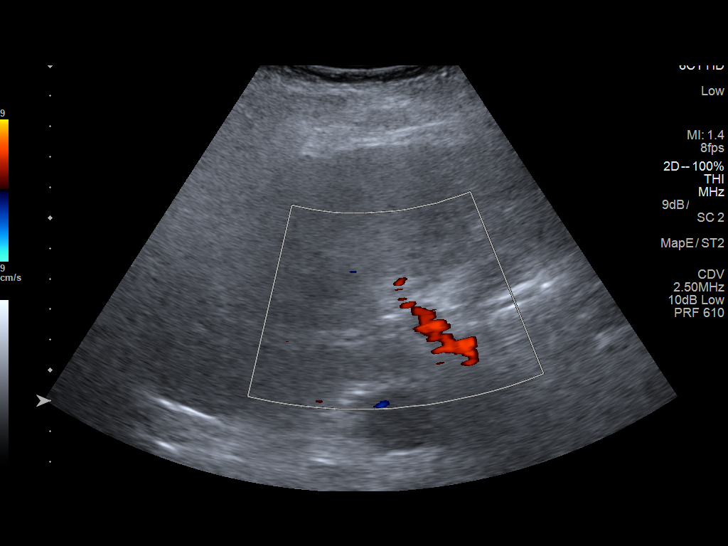

[14 of 25 positions shown; findings below may reference images not displayed]

FINDINGS: Gallbladder:

No gallstones or wall thickening visualized. No sonographic Murphy
sign noted by sonographer.

Common bile duct:

Diameter: 9.6 mm

Liver:

Liver has a echogenic echotexture with irregular contour. Findings
suggest cirrhosis. No focal hepatic abnormality identified.
IMPRESSION: 1. Liver has an echogenic echotexture with irregular contour.
Findings suggest cirrhosis. No focal hepatic abnormality identified.

2. Dilated common bile duct at 9.6 mm. No gallstones identified.
MRCP can be obtained to further evaluate

## 2018-03-26 LAB — CUP PACEART REMOTE DEVICE CHECK
Date Time Interrogation Session: 20200125074945
Implantable Lead Implant Date: 20171026
Implantable Lead Implant Date: 20171026
Implantable Lead Location: 753859
Implantable Lead Location: 753860
Implantable Lead Model: 7740
Implantable Lead Model: 7741
Implantable Lead Serial Number: 685941
Implantable Lead Serial Number: 818382
Implantable Pulse Generator Implant Date: 20171026
Pulse Gen Serial Number: 766992

## 2018-03-29 ENCOUNTER — Encounter: Payer: Medicare Other | Admitting: Internal Medicine

## 2018-05-02 ENCOUNTER — Ambulatory Visit: Payer: Medicare Other | Admitting: Cardiology

## 2018-05-09 ENCOUNTER — Ambulatory Visit (INDEPENDENT_AMBULATORY_CARE_PROVIDER_SITE_OTHER): Payer: Medicare Other | Admitting: *Deleted

## 2018-05-09 DIAGNOSIS — I495 Sick sinus syndrome: Secondary | ICD-10-CM | POA: Diagnosis not present

## 2018-05-09 DIAGNOSIS — R001 Bradycardia, unspecified: Secondary | ICD-10-CM

## 2018-05-10 LAB — CUP PACEART REMOTE DEVICE CHECK
Battery Remaining Longevity: 162 mo
Battery Remaining Percentage: 100 %
Brady Statistic RA Percent Paced: 63 %
Brady Statistic RV Percent Paced: 1 %
Date Time Interrogation Session: 20200309044800
Implantable Lead Implant Date: 20171026
Implantable Lead Implant Date: 20171026
Implantable Lead Location: 753859
Implantable Lead Location: 753860
Implantable Lead Model: 7740
Implantable Lead Model: 7741
Implantable Lead Serial Number: 685941
Implantable Lead Serial Number: 818382
Implantable Pulse Generator Implant Date: 20171026
Lead Channel Impedance Value: 1296 Ohm
Lead Channel Impedance Value: 745 Ohm
Lead Channel Pacing Threshold Amplitude: 0.6 V
Lead Channel Pacing Threshold Amplitude: 1.3 V
Lead Channel Pacing Threshold Pulse Width: 0.4 ms
Lead Channel Pacing Threshold Pulse Width: 0.4 ms
Lead Channel Setting Pacing Amplitude: 2 V
Lead Channel Setting Pacing Amplitude: 2.4 V
Lead Channel Setting Pacing Pulse Width: 0.4 ms
Lead Channel Setting Sensing Sensitivity: 2.5 mV
Pulse Gen Serial Number: 766992

## 2018-05-16 NOTE — Progress Notes (Signed)
Remote pacemaker transmission.   

## 2018-05-31 ENCOUNTER — Telehealth: Payer: Self-pay | Admitting: Internal Medicine

## 2018-05-31 NOTE — Telephone Encounter (Signed)
Follow up    Tried to call patient back, phone rang but no answer. Voicemail is still full. Will try again tomorrow.

## 2018-05-31 NOTE — Telephone Encounter (Signed)
New message   Called patient about 04.02.20 visit with Dr Ladona Ridgel. Patient phone goes straight to voicemail and voicemail box is full, could not leave message.

## 2018-06-01 NOTE — Telephone Encounter (Signed)
Call placed to Pt.  Pt does not have smart phone or ability to get on Mychart.  Pt complains of constipation "hasn't gone to the bathroom in 4 days" and she wants pain medicine for her back.

## 2018-06-02 ENCOUNTER — Encounter: Payer: Medicare Other | Admitting: Internal Medicine

## 2018-06-02 ENCOUNTER — Telehealth (INDEPENDENT_AMBULATORY_CARE_PROVIDER_SITE_OTHER): Payer: Medicare Other | Admitting: Internal Medicine

## 2018-06-02 ENCOUNTER — Other Ambulatory Visit: Payer: Self-pay

## 2018-06-02 DIAGNOSIS — I2510 Atherosclerotic heart disease of native coronary artery without angina pectoris: Secondary | ICD-10-CM

## 2018-06-02 DIAGNOSIS — I2583 Coronary atherosclerosis due to lipid rich plaque: Secondary | ICD-10-CM

## 2018-06-02 DIAGNOSIS — R002 Palpitations: Secondary | ICD-10-CM | POA: Diagnosis not present

## 2018-06-02 DIAGNOSIS — I495 Sick sinus syndrome: Secondary | ICD-10-CM | POA: Diagnosis not present

## 2018-06-02 NOTE — Progress Notes (Signed)
Electrophysiology TeleHealth Note   Due to national recommendations of social distancing due to COVID 19, an audio/video telehealth visit is felt to be most appropriate for this patient at this time.  See MyChart message from today for the patient's consent to telehealth for [facility].   Date:  06/02/2018   ID:  Gabrielle [name], Gabrielle [name] [birth year], MRN [id number]  Location: patient's home  Provider location: [address] [place]  Evaluation Performed: Follow-up visit  PCP:  Henrine Screws, MD  Cardiologist:  Donato Schultz, MD  Electrophysiologist:  Dr Ladona Ridgel  Chief Complaint:  " My bowels have bee stopped up" History of Present Illness:    Gabrielle [name] is a 73 y.o. female who presents via audio/video conferencing for a telehealth visit today.  She is a pleasant 73 yo woman with a h/o CAD, s/p CABG, symptomatic bradycardia due to sinus node dysfunction. Since last being seen in our clinic, the patient reports doing very well.  Today, she notes some improvement in her bowel habit after taking a suppository. She notes occaisional break through palpitations. She has had some back pain which is improving. No sob. Her blood pressure has been good.   The patient denies symptoms of fevers, chills, cough, or new SOB worrisome for COVID 19.   Past Medical History:  Diagnosis Date  . Anemia   . Arthritis   . Asthma   . Atrial fibrillation with controlled ventricular response (HCC) 11/2009   a. not on anticoagulation due to recurrent GI bleeding while on Eliquis.  . Chronic bronchitis   . Chronic diastolic CHF (congestive heart failure) (HCC)    a. Echo 12/2015: EF 55-60% w/ Grade 3 DD  . Chronic headache   . COPD (chronic obstructive pulmonary disease) (HCC)   . Depression   . Heart attack (HCC)   . Hyperlipidemia   . Hypertension     Past Surgical History:  Procedure Laterality Date  . CAD-CABG     x7, brief post-op Atrial Fib  . CESAREAN SECTION     x2   . COLONOSCOPY WITH PROPOFOL N/A 10/24/2015   Procedure: COLONOSCOPY WITH PROPOFOL;  Surgeon: Carman Ching, MD;  Location: [facility] ENDOSCOPY;  Service: Endoscopy;  Laterality: N/A;  . CORONARY ARTERY BYPASS GRAFT  2011  . EP IMPLANTABLE DEVICE N/A 12/26/2015   Procedure: Pacemaker Implant;  Surgeon: Marinus Maw, MD;  Location: [facility] INVASIVE CV LAB;  Service: Cardiovascular;  Laterality: N/A;  . ESOPHAGOGASTRODUODENOSCOPY N/A 10/22/2015   Procedure: ESOPHAGOGASTRODUODENOSCOPY (EGD);  Surgeon: Carman Ching, MD;  Location: [facility] ENDOSCOPY;  Service: Endoscopy;  Laterality: N/A;  . ESOPHAGOGASTRODUODENOSCOPY N/A 12/24/2015   Procedure: ESOPHAGOGASTRODUODENOSCOPY (EGD);  Surgeon: Carman Ching, MD;  Location: [facility] ENDOSCOPY;  Service: Endoscopy;  Laterality: N/A;  . JOINT REPLACEMENT Bilateral   . TONSILLECTOMY    . TOTAL KNEE ARTHROPLASTY      Current Outpatient Medications  Medication Sig Dispense Refill  . Acetaminophen (TYLENOL 8 HOUR ARTHRITIS PAIN PO) Take 2 tablets by mouth 2 (two) times daily. Back pain    . albuterol (PROVENTIL HFA;VENTOLIN HFA) 108 (90 Base) MCG/ACT inhaler Inhale 2 puffs into the lungs every 6 (six) hours as needed for wheezing or shortness of breath.    . ALPRAZolam (XANAX) 0.5 MG tablet Take 0.5 mg by mouth 2 (two) times daily.  1  . atorvastatin (LIPITOR) 20 MG tablet Take 1 tablet (20 mg total) by mouth daily. 90 tablet 3  . budesonide-formoterol (SYMBICORT) 160-4.5 MCG/ACT  inhaler Inhale 2 puffs into the lungs 2 (two) times daily. 10.2 g 6  . citalopram (CELEXA) 20 MG tablet Take 20 mg by mouth daily.  3  . clobetasol cream (TEMOVATE) 0.05 % Apply 1 application topically 2 (two) times daily. Affected skin    . COSENTYX SENSOREADY 300 DOSE 150 MG/ML SOAJ as directed.     . docusate sodium (COLACE) 100 MG capsule Take 100 mg by mouth 2 (two) times daily.     . fluticasone (FLONASE) 50 MCG/ACT nasal spray Place 2 sprays into both nostrils daily. 16 g 0  . furosemide (LASIX)  20 MG tablet Take 1 tablet (20 mg total) by mouth daily. 90 tablet 3  . metoprolol tartrate (LOPRESSOR) 50 MG tablet Take 1 tablet (50 mg total) by mouth at bedtime. May take an additional 50 mg (one tablet) as needed for palpitations. 120 tablet 3  . pantoprazole (PROTONIX) 40 MG tablet Take 1 tablet (40 mg total) by mouth daily before breakfast. 90 tablet 3  . potassium chloride SA (K-DUR,KLOR-CON) 20 MEQ tablet Take 1 tablet (20 mEq total) by mouth daily. 90 tablet 3  . ranitidine (ZANTAC) 150 MG tablet Take 1 tablet (150 mg total) by mouth at bedtime.    Marland Kitchen. tiZANidine (ZANAFLEX) 4 MG tablet Take 4 mg by mouth at bedtime as needed for muscle spasms.  4   No current facility-administered medications for this visit.     Allergies:   Novocain [procaine] and Penicillins   Social History:  The patient  reports that she has been smoking cigarettes. She has been smoking about 0.20 packs per day. She has never used smokeless tobacco. She reports that she does not drink alcohol or use drugs.   Family History:  The patient's  family history includes Diabetes in her mother; Heart attack in her father; Heart disease in her father and mother; Hypertension in her daughter, father, and mother; Lung cancer in her maternal grandfather and paternal grandfather; Stroke in her paternal grandmother.   ROS:  Please see the history of present illness.   All other systems are personally reviewed and negative.    Exam:    Vital Signs:  None today   Labs/Other Tests and Data Reviewed:    Recent Labs: No results found for requested labs within last 8760 hours.   Wt Readings from Last 3 Encounters:  01/20/18 213 lb 12.8 oz (97 kg)  10/27/17 190 lb 1.9 oz (86.2 kg)  04/23/17 199 lb 1.9 oz (90.3 kg)     Other studies personally reviewed:  Last device remote is reviewed from PaceART PDF dated 05/09/18 which reveals normal device function, no arrhythmias    ASSESSMENT & PLAN:    1.  Sinus node dysfunction  - she is s/p PPM insertion and doing well.  2. Palpitations - she has occaisional breakthrough episodes and I have recommended she take additional metoprolol as needed.  3. CAD - she denies anginal symptoms.  4. Constipation - this appears to be improving. I encouraged her to take the suppository as needed. If it does not improve, she will need additional evaluation. 5. COVID 19 screen The patient denies symptoms of COVID 19 at this time.  The importance of social distancing was discussed today.  Follow-up:  6 months with me Next remote: 08/10/18  Current medicines are reviewed at length with the patient today.   The patient does not have concerns regarding her medicines.  The following changes were made today:  none  Labs/ tests ordered today include: none No orders of the defined types were placed in this encounter.    Patient Risk:  after full review of this patients clinical status, I feel that they are at moderate risk at this time.  Today, I have spent 12 minutes with the patient with telehealth technology discussing the above problems.    Signed, Lewayne Bunting, MD  06/02/2018 10:02 AM     [facility] [address] [place] [address] [phone] (office) [phone] (fax)

## 2018-06-23 ENCOUNTER — Telehealth: Payer: Self-pay | Admitting: Cardiology

## 2018-06-23 NOTE — Telephone Encounter (Signed)
Spoke with patient for a long time today about her blood pressures being elevated.  She states she has only been taking it since the beginning of this month.  She reports it ranges from 90/67 to 199/?Marland Kitchen  She reports sleeping a lot today but also took an extra nerve pill because her HHN got her upset today.  She reports having some blood on the tissue when she blows her nose.  This has happened a couple of times the last few days.  She has an appt with Dr Abigail Miyamoto tomorrow.  Advised her to take her BP cuff with her, along with the readings she has been getting and her medications.  She states understanding and thanked me for calling her back and taking time with her.  Sh will c/b if further issues or concerns.

## 2018-06-23 NOTE — Telephone Encounter (Signed)
New Message    Pt c/o BP issue: STAT if pt c/o blurred vision, one-sided weakness or slurred speech  1. What are your last 5 BP readings? 159/79, 171/92, 171/95, 152/94,  199/97 (this morning)  2. Are you having any other symptoms (ex. Dizziness, headache, blurred vision, passed out)? Headache and her eyes are and nose are watery   3. What is your BP issue? Pts bp has been elevated and a nurse from Dr Nils Flack office told the pt to follow up with cardiology.

## 2018-07-01 ENCOUNTER — Inpatient Hospital Stay (HOSPITAL_COMMUNITY)
Admission: EM | Admit: 2018-07-01 | Discharge: 2018-07-06 | DRG: 391 | Disposition: A | Discharge status: Home Health | Payer: Medicare Other | Attending: Family Medicine | Admitting: Family Medicine

## 2018-07-01 ENCOUNTER — Encounter (HOSPITAL_COMMUNITY): Payer: Self-pay | Admitting: Emergency Medicine

## 2018-07-01 ENCOUNTER — Other Ambulatory Visit: Payer: Self-pay

## 2018-07-01 DIAGNOSIS — R0602 Shortness of breath: Secondary | ICD-10-CM

## 2018-07-01 DIAGNOSIS — K529 Noninfective gastroenteritis and colitis, unspecified: Secondary | ICD-10-CM | POA: Diagnosis not present

## 2018-07-01 DIAGNOSIS — M1990 Unspecified osteoarthritis, unspecified site: Secondary | ICD-10-CM | POA: Diagnosis present

## 2018-07-01 DIAGNOSIS — E669 Obesity, unspecified: Secondary | ICD-10-CM | POA: Diagnosis present

## 2018-07-01 DIAGNOSIS — I480 Paroxysmal atrial fibrillation: Secondary | ICD-10-CM | POA: Diagnosis not present

## 2018-07-01 DIAGNOSIS — F32A Depression, unspecified: Secondary | ICD-10-CM | POA: Diagnosis present

## 2018-07-01 DIAGNOSIS — I2583 Coronary atherosclerosis due to lipid rich plaque: Secondary | ICD-10-CM | POA: Diagnosis present

## 2018-07-01 DIAGNOSIS — D509 Iron deficiency anemia, unspecified: Secondary | ICD-10-CM | POA: Diagnosis present

## 2018-07-01 DIAGNOSIS — F17200 Nicotine dependence, unspecified, uncomplicated: Secondary | ICD-10-CM | POA: Diagnosis present

## 2018-07-01 DIAGNOSIS — N289 Disorder of kidney and ureter, unspecified: Secondary | ICD-10-CM | POA: Diagnosis not present

## 2018-07-01 DIAGNOSIS — I110 Hypertensive heart disease with heart failure: Secondary | ICD-10-CM | POA: Diagnosis present

## 2018-07-01 DIAGNOSIS — I252 Old myocardial infarction: Secondary | ICD-10-CM

## 2018-07-01 DIAGNOSIS — Z951 Presence of aortocoronary bypass graft: Secondary | ICD-10-CM

## 2018-07-01 DIAGNOSIS — N390 Urinary tract infection, site not specified: Secondary | ICD-10-CM

## 2018-07-01 DIAGNOSIS — I495 Sick sinus syndrome: Secondary | ICD-10-CM | POA: Diagnosis present

## 2018-07-01 DIAGNOSIS — I5033 Acute on chronic diastolic (congestive) heart failure: Secondary | ICD-10-CM | POA: Diagnosis not present

## 2018-07-01 DIAGNOSIS — K219 Gastro-esophageal reflux disease without esophagitis: Secondary | ICD-10-CM | POA: Diagnosis present

## 2018-07-01 DIAGNOSIS — E860 Dehydration: Secondary | ICD-10-CM | POA: Diagnosis present

## 2018-07-01 DIAGNOSIS — R112 Nausea with vomiting, unspecified: Secondary | ICD-10-CM | POA: Diagnosis not present

## 2018-07-01 DIAGNOSIS — Z950 Presence of cardiac pacemaker: Secondary | ICD-10-CM

## 2018-07-01 DIAGNOSIS — F418 Other specified anxiety disorders: Secondary | ICD-10-CM | POA: Diagnosis present

## 2018-07-01 DIAGNOSIS — E785 Hyperlipidemia, unspecified: Secondary | ICD-10-CM | POA: Diagnosis present

## 2018-07-01 DIAGNOSIS — J9601 Acute respiratory failure with hypoxia: Secondary | ICD-10-CM | POA: Diagnosis not present

## 2018-07-01 DIAGNOSIS — I4891 Unspecified atrial fibrillation: Secondary | ICD-10-CM | POA: Diagnosis present

## 2018-07-01 DIAGNOSIS — L409 Psoriasis, unspecified: Secondary | ICD-10-CM | POA: Diagnosis present

## 2018-07-01 DIAGNOSIS — Z884 Allergy status to anesthetic agent status: Secondary | ICD-10-CM

## 2018-07-01 DIAGNOSIS — I2510 Atherosclerotic heart disease of native coronary artery without angina pectoris: Secondary | ICD-10-CM | POA: Diagnosis present

## 2018-07-01 DIAGNOSIS — Z20828 Contact with and (suspected) exposure to other viral communicable diseases: Secondary | ICD-10-CM | POA: Diagnosis present

## 2018-07-01 DIAGNOSIS — R197 Diarrhea, unspecified: Secondary | ICD-10-CM

## 2018-07-01 DIAGNOSIS — Z801 Family history of malignant neoplasm of trachea, bronchus and lung: Secondary | ICD-10-CM

## 2018-07-01 DIAGNOSIS — I5032 Chronic diastolic (congestive) heart failure: Secondary | ICD-10-CM

## 2018-07-01 DIAGNOSIS — Z96653 Presence of artificial knee joint, bilateral: Secondary | ICD-10-CM | POA: Diagnosis present

## 2018-07-01 DIAGNOSIS — Z880 Allergy status to penicillin: Secondary | ICD-10-CM

## 2018-07-01 DIAGNOSIS — J439 Emphysema, unspecified: Secondary | ICD-10-CM | POA: Diagnosis present

## 2018-07-01 DIAGNOSIS — I272 Pulmonary hypertension, unspecified: Secondary | ICD-10-CM | POA: Diagnosis present

## 2018-07-01 DIAGNOSIS — Z7951 Long term (current) use of inhaled steroids: Secondary | ICD-10-CM

## 2018-07-01 DIAGNOSIS — Z8249 Family history of ischemic heart disease and other diseases of the circulatory system: Secondary | ICD-10-CM

## 2018-07-01 DIAGNOSIS — G4700 Insomnia, unspecified: Secondary | ICD-10-CM | POA: Diagnosis present

## 2018-07-01 DIAGNOSIS — F329 Major depressive disorder, single episode, unspecified: Secondary | ICD-10-CM | POA: Diagnosis present

## 2018-07-01 DIAGNOSIS — Z79899 Other long term (current) drug therapy: Secondary | ICD-10-CM

## 2018-07-01 DIAGNOSIS — F17210 Nicotine dependence, cigarettes, uncomplicated: Secondary | ICD-10-CM | POA: Diagnosis present

## 2018-07-01 HISTORY — DX: Urinary tract infection, site not specified: N39.0

## 2018-07-01 LAB — CBC WITH DIFFERENTIAL/PLATELET
Abs Immature Granulocytes: 0.22 10*3/uL — ABNORMAL HIGH (ref 0.00–0.07)
Basophils Absolute: 0 10*3/uL (ref 0.0–0.1)
Basophils Relative: 0 %
Eosinophils Absolute: 0.1 10*3/uL (ref 0.0–0.5)
Eosinophils Relative: 0 %
HCT: 39.1 % (ref 36.0–46.0)
Hemoglobin: 12.1 g/dL (ref 12.0–15.0)
Immature Granulocytes: 1 %
Lymphocytes Relative: 4 %
Lymphs Abs: 0.8 10*3/uL (ref 0.7–4.0)
MCH: 29 pg (ref 26.0–34.0)
MCHC: 30.9 g/dL (ref 30.0–36.0)
MCV: 93.8 fL (ref 80.0–100.0)
Monocytes Absolute: 1.5 10*3/uL — ABNORMAL HIGH (ref 0.1–1.0)
Monocytes Relative: 7 %
Neutro Abs: 19.5 10*3/uL — ABNORMAL HIGH (ref 1.7–7.7)
Neutrophils Relative %: 88 %
Platelets: 225 10*3/uL (ref 150–400)
RBC: 4.17 MIL/uL (ref 3.87–5.11)
RDW: 16.9 % — ABNORMAL HIGH (ref 11.5–15.5)
WBC: 22.2 10*3/uL — ABNORMAL HIGH (ref 4.0–10.5)
nRBC: 0 % (ref 0.0–0.2)

## 2018-07-01 LAB — COMPREHENSIVE METABOLIC PANEL
ALT: 8 U/L (ref 0–44)
AST: 28 U/L (ref 15–41)
Albumin: 4.2 g/dL (ref 3.5–5.0)
Alkaline Phosphatase: 89 U/L (ref 38–126)
Anion gap: 13 (ref 5–15)
BUN: 23 mg/dL (ref 8–23)
CO2: 20 mmol/L — ABNORMAL LOW (ref 22–32)
Calcium: 10 mg/dL (ref 8.9–10.3)
Chloride: 107 mmol/L (ref 98–111)
Creatinine, Ser: 1.24 mg/dL — ABNORMAL HIGH (ref 0.44–1.00)
GFR calc Af Amer: 50 mL/min — ABNORMAL LOW (ref >60)
GFR calc non Af Amer: 43 mL/min — ABNORMAL LOW (ref >60)
Glucose, Bld: 184 mg/dL — ABNORMAL HIGH (ref 70–99)
Potassium: 3.7 mmol/L (ref 3.5–5.1)
Sodium: 140 mmol/L (ref 135–145)
Total Bilirubin: 0.5 mg/dL (ref 0.3–1.2)
Total Protein: 7.9 g/dL (ref 6.5–8.1)

## 2018-07-01 LAB — URINALYSIS, ROUTINE W REFLEX MICROSCOPIC
Bilirubin Urine: NEGATIVE
Glucose, UA: NEGATIVE mg/dL
Ketones, ur: 5 mg/dL — AB
Nitrite: NEGATIVE
Protein, ur: 100 mg/dL — AB
Specific Gravity, Urine: 1.029 (ref 1.005–1.030)
WBC, UA: >50 WBC/hpf — ABNORMAL HIGH (ref 0–5)
pH: 5 (ref 5.0–8.0)

## 2018-07-01 LAB — MAGNESIUM: Magnesium: 2 mg/dL (ref 1.7–2.4)

## 2018-07-01 MED ORDER — SODIUM CHLORIDE 0.9 % IV BOLUS
1000.0000 mL | Freq: Once | INTRAVENOUS | Status: AC
  Administered 2018-07-01: 1000 mL via INTRAVENOUS

## 2018-07-01 MED ORDER — SODIUM CHLORIDE 0.9 % IV BOLUS: 1000.0000 mL | Freq: Once | INTRAVENOUS | Status: DC

## 2018-07-01 MED ORDER — ONDANSETRON HCL 4 MG/2ML IJ SOLN
4.0000 mg | Freq: Once | INTRAMUSCULAR | Status: AC
  Administered 2018-07-01: 4 mg via INTRAVENOUS
  Filled 2018-07-01: qty 2

## 2018-07-01 MED ORDER — SODIUM CHLORIDE 0.9 % IV SOLN
1.0000 g | Freq: Once | INTRAVENOUS | Status: AC
  Administered 2018-07-01: 1 g via INTRAVENOUS
  Filled 2018-07-01: qty 10

## 2018-07-01 MED ORDER — CEPHALEXIN 500 MG PO CAPS
500.0000 mg | ORAL_CAPSULE | Freq: Once | ORAL | Status: AC
  Administered 2018-07-01: 500 mg via ORAL
  Filled 2018-07-01: qty 1

## 2018-07-01 NOTE — ED Provider Notes (Signed)
[facility]-EMERGENCY DEPT Provider Note   CSN: [id number] Arrival date & time: 07/01/18  1416    History   Chief Complaint Chief Complaint  Patient presents with  . Shortness of Breath  . Cough  . Nausea    HPI Gabrielle [name] is a 73 y.o. female.     HPI   She presents for evaluation of nausea, vomiting and diarrhea for several days.  She also complains of generalized weakness, and achiness.  She denies fever, chills, cough, shortness of breath, focal weakness or paresthesia.  She did not take her usual medications today.  She presents by EMS.  Reportedly she had a rapid heart rate greater than 200 and cardiac monitor indicating atrial fibrillation during transport.  On arrival she is not in atrial fibrillation and has a much lower heart rate.  Patient has not recently seen her cardiologist.  There are no other known modifying factors.  Past Medical History:  Diagnosis Date  . Anemia   . Arthritis   . Asthma   . Atrial fibrillation with controlled ventricular response (HCC) 11/2009   a. not on anticoagulation due to recurrent GI bleeding while on Eliquis.  . Chronic bronchitis   . Chronic diastolic CHF (congestive heart failure) (HCC)    a. Echo 12/2015: EF 55-60% w/ Grade 3 DD  . Chronic headache   . COPD (chronic obstructive pulmonary disease) (HCC)   . Depression   . Heart attack (HCC)   . Hyperlipidemia   . Hypertension     Patient Active Problem List   Diagnosis Date Noted  . Compression fracture of T12 vertebra (HCC)   . Hypoxia   . Acute on chronic diastolic CHF (congestive heart failure) /EF 55 % 08/19/2016  . CHF exacerbation (HCC) 08/19/2016  . Chronic diastolic CHF (congestive heart failure) (HCC)   . Hypoxemia   . Physical deconditioning   . Dementia with behavioral disturbance (HCC)   . H/O: GI bleed   . Protein-calorie malnutrition, severe 05/15/2016  . Altered mental status   . Acute renal failure (ARF) (HCC)   .  Encounter for central line care   . Hypokalemia 05/11/2016  . Thrombocytopenia (HCC) 12/25/2015  . Acute congestive heart failure (HCC)   . SOB (shortness of breath)   . Dysphagia 12/24/2015  . Tachy-brady syndrome (HCC) 12/24/2015  . Chronic anticoagulation   . Fall   . Prolonged Q-T interval on ECG   . Hypoxic Respiratory failure, acute (HCC) 12/16/2015  . Anemia due to blood loss, acute 10/27/2015  . Elevated lactic acid level 10/27/2015  . Arterial hypotension   . PAF (paroxysmal atrial fibrillation) (HCC) 10/21/2015  . Severe anemia   . Symptomatic anemia   . Coronary artery disease due to lipid rich plaque 02/07/2014  . Obesity 02/07/2014  . Abdominal aortic ectasia (HCC) 09/06/2013  . Encounter for therapeutic drug monitoring 07/13/2013  . Atrial fibrillation (HCC) 12/20/2012  . Atrial fibrillation with controlled ventricular response (HCC) 11/30/2009  . NICOTINE ADDICTION 04/02/2009  . PARAPSORIASIS 12/15/2008  . Hyperlipidemia 12/14/2008  . HEART ATTACK 12/14/2008  . HEADACHE, CHRONIC 12/14/2008  . Depression 12/13/2008  . 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 COPD exacerbation (HCC) 12/13/2008  . ARTHRITIS 12/13/2008    Past Surgical History:  Procedure Laterality Date  . CAD-CABG     x7, brief post-op Atrial Fib  . CESAREAN SECTION     x2  . COLONOSCOPY WITH PROPOFOL N/A 10/24/2015  Procedure: COLONOSCOPY WITH PROPOFOL;  Surgeon: Carman ChingJames Edwards, MD;  [facility];  Service: Endoscopy;  Laterality: N/A;  . CORONARY ARTERY BYPASS GRAFT  2011  . EP IMPLANTABLE DEVICE N/A 12/26/2015   Procedure: Pacemaker Implant;  Surgeon: Marinus MawGregg W Taylor, MD;  [facility];  Service: Cardiovascular;  Laterality: N/A;  . ESOPHAGOGASTRODUODENOSCOPY N/A 10/22/2015   Procedure: ESOPHAGOGASTRODUODENOSCOPY (EGD);  Surgeon: Carman ChingJames Edwards, MD;  [facility];  Service: Endoscopy;  Laterality: N/A;  .  ESOPHAGOGASTRODUODENOSCOPY N/A 12/24/2015   Procedure: ESOPHAGOGASTRODUODENOSCOPY (EGD);  Surgeon: Carman ChingJames Edwards, MD;  [facility];  Service: Endoscopy;  Laterality: N/A;  . JOINT REPLACEMENT Bilateral   . TONSILLECTOMY    . TOTAL KNEE ARTHROPLASTY       OB History   No obstetric history on file.      Home Medications    Prior to Admission medications   Medication Sig Start Date End Date Taking? Authorizing Provider  Acetaminophen (TYLENOL 8 HOUR ARTHRITIS PAIN PO) Take 2 tablets by mouth 2 (two) times daily. Back pain   Yes [provider]  albuterol (PROVENTIL HFA;VENTOLIN HFA) 108 (90 Base) MCG/ACT inhaler Inhale 2 puffs into the lungs every 6 (six) hours as needed for wheezing or shortness of breath.   Yes [provider]  ALPRAZolam Prudy Feeler(XANAX) 1 MG tablet Take 1 mg by mouth at bedtime. 06/26/18  Yes [provider]  atorvastatin (LIPITOR) 20 MG tablet Take 1 tablet (20 mg total) by mouth daily. 10/27/17  Yes Rosalio MacadamiaGerhardt, Lori C, NP  budesonide-formoterol (SYMBICORT) 160-4.5 MCG/ACT inhaler Inhale 2 puffs into the lungs 2 (two) times daily. 01/15/14  Yes Storm FriskWright, Patrick E, MD  citalopram (CELEXA) 20 MG tablet Take 20 mg by mouth daily. 07/08/16  Yes [provider]  COSENTYX SENSOREADY 300 DOSE 150 MG/ML SOAJ as directed.  09/30/16  Yes [provider]  docusate sodium (COLACE) 100 MG capsule Take 100 mg by mouth 2 (two) times daily.    Yes [provider]  fluticasone (FLONASE) 50 MCG/ACT nasal spray Place 2 sprays into both nostrils daily. 12/21/15  Yes Albertine GratesXu, Fang, MD  furosemide (LASIX) 20 MG tablet Take 1 tablet (20 mg total) by mouth daily. 10/27/17  Yes Rosalio MacadamiaGerhardt, Lori C, NP  pantoprazole (PROTONIX) 40 MG tablet Take 1 tablet (40 mg total) by mouth daily before breakfast. 10/27/17  Yes Rosalio MacadamiaGerhardt, Lori C, NP  potassium chloride SA (K-DUR,KLOR-CON) 20 MEQ tablet Take 1 tablet (20 mEq total) by mouth daily. 10/27/17  Yes Rosalio MacadamiaGerhardt, Lori  C, NP  tiZANidine (ZANAFLEX) 4 MG tablet Take 4 mg by mouth at bedtime as needed for muscle spasms. 07/12/16  Yes [provider]  clobetasol cream (TEMOVATE) 0.05 % Apply 1 application topically 2 (two) times daily. Affected skin Patient not taking: Reported on 07/01/2018 05/21/16   Vassie LollMadera, Carlos, MD  metoprolol tartrate (LOPRESSOR) 50 MG tablet Take 1 tablet (50 mg total) by mouth at bedtime. May take an additional 50 mg (one tablet) as needed for palpitations. Patient not taking: Reported on 07/01/2018 10/27/17 01/25/18  Rosalio MacadamiaGerhardt, Lori C, NP  ranitidine (ZANTAC) 150 MG tablet Take 1 tablet (150 mg total) by mouth at bedtime. Patient not taking: Reported on 07/01/2018 05/21/16   Vassie LollMadera, Carlos, MD    Family History Family History  Problem Relation Age of Onset  . Diabetes Mother   . Hypertension Mother   . Heart disease Mother        before age 75  . Heart disease  Father        before age 42  . Hypertension Father   . Heart attack Father   . Lung cancer Maternal Grandfather   . Lung cancer Paternal Grandfather   . Hypertension Daughter   . Stroke Paternal Grandmother     Social History Social History   Tobacco Use  . Smoking status: Current Some Day Smoker    Packs/day: 0.20    Types: Cigarettes  . Smokeless tobacco: Never Used  . Tobacco comment: "Occasionally I will smoke a cigarette"   Substance Use Topics  . Alcohol use: No    Alcohol/week: 0.0 standard drinks  . Drug use: No     Allergies   Novocain [procaine] and Penicillins   Review of Systems Review of Systems  All other systems reviewed and are negative.    Physical Exam Updated Vital Signs BP (!) 128/104 (BP Location: Right Arm)   Pulse (!) 118   Temp 98.4 F (36.9 C) (Oral)   Resp 20   Ht 5' 2.5" (1.588 m)   Wt 89.8 kg   LMP  (LMP Unknown)   SpO2 96%   BMI 35.64 kg/m   Physical Exam Vitals signs and nursing note reviewed.  Constitutional:      General: She is not in acute distress.     Appearance: She is well-developed. She is obese. She is not ill-appearing, toxic-appearing or diaphoretic.  HENT:     Head: Normocephalic and atraumatic.     Right Ear: External ear normal.     Left Ear: External ear normal.     Mouth/Throat:     Mouth: Mucous membranes are moist.     Pharynx: No pharyngeal swelling or oropharyngeal exudate.  Eyes:     Conjunctiva/sclera: Conjunctivae normal.     Pupils: Pupils are equal, round, and reactive to light.  Neck:     Musculoskeletal: Normal range of motion and neck supple.     Trachea: Phonation normal.  Cardiovascular:     Rate and Rhythm: Regular rhythm. Tachycardia present.     Heart sounds: Normal heart sounds. No murmur.  Pulmonary:     Effort: Pulmonary effort is normal. No respiratory distress.     Breath sounds: Normal breath sounds. No stridor.  Abdominal:     General: There is no distension.     Palpations: Abdomen is soft.     Tenderness: There is no abdominal tenderness. There is no guarding.  Musculoskeletal: Normal range of motion.        General: No swelling or tenderness.  Skin:    General: Skin is warm and dry.  Neurological:     Mental Status: She is alert and oriented to person, place, and time.     Cranial Nerves: No cranial nerve deficit.     Sensory: No sensory deficit.     Motor: No abnormal muscle tone.     Coordination: Coordination normal.  Psychiatric:        Behavior: Behavior normal.        Thought Content: Thought content normal.        Judgment: Judgment normal.      ED Treatments / Results  Labs (all labs ordered are listed, but only abnormal results are displayed) Labs Reviewed  COMPREHENSIVE METABOLIC PANEL - Abnormal; Notable for the following components:      Result Value   CO2 20 (*)    Glucose, Bld 184 (*)    Creatinine, Ser 1.24 (*)    GFR  calc non Af Amer 43 (*)    GFR calc Af Amer 50 (*)    All other components within normal limits  CBC WITH DIFFERENTIAL/PLATELET - Abnormal;  Notable for the following components:   WBC 22.2 (*)    RDW 16.9 (*)    Neutro Abs 19.5 (*)    Monocytes Absolute 1.5 (*)    Abs Immature Granulocytes 0.22 (*)    All other components within normal limits  URINALYSIS, ROUTINE W REFLEX MICROSCOPIC - Abnormal; Notable for the following components:   APPearance HAZY (*)    Hgb urine dipstick MODERATE (*)    Ketones, ur 5 (*)    Protein, ur 100 (*)    Leukocytes,Ua LARGE (*)    WBC, UA >50 (*)    Bacteria, UA RARE (*)    All other components within normal limits  URINE CULTURE  SARS CORONAVIRUS 2 (HOSPITAL ORDER, PERFORMED IN [facility])  MAGNESIUM    EKG EKG Interpretation  Date/Time:  Friday Jul 01 2018 14:39:44 EDT Ventricular Rate:  123 PR Interval:    QRS Duration: 103 QT Interval:  359 QTC Calculation: 486 R Axis:   -2 Text Interpretation:  Sinus tachycardia Ventricular bigeminy Borderline prolonged QT interval Since last tracing Premature ventricular complexes new Confirmed by Mancel Bale [phone]) on 07/01/2018 3:15:25 PM   Radiology No results found.  Procedures Procedures (including critical care time)  Medications Ordered in ED Medications  sodium chloride 0.9 % bolus 1,000 mL (has no administration in time range)  cefTRIAXone (ROCEPHIN) 1 g in sodium chloride 0.9 % 100 mL IVPB (has no administration in time range)  ondansetron (ZOFRAN) injection 4 mg (4 mg Intravenous Given 07/01/18 1631)  sodium chloride 0.9 % bolus 1,000 mL (0 mLs Intravenous Stopped 07/01/18 1927)  cephALEXin (KEFLEX) capsule 500 mg (500 mg Oral Given 07/01/18 1746)  sodium chloride 0.9 % bolus 1,000 mL (1,000 mLs Intravenous New Bag/Given 07/01/18 1928)  ondansetron (ZOFRAN) injection 4 mg (4 mg Intravenous Given 07/01/18 2118)     Initial Impression / Assessment and Plan / ED Course  I have reviewed the triage vital signs and the nursing notes.  Pertinent labs & imaging results that were available during my care of the patient  were reviewed by me and considered in my medical decision making (see chart for details).  Clinical Course as of Jun 30 2208  Fri Jul 01, 2018  1630 Normal  Magnesium [EW]  1630 Normal except CO2 low, glucose high, creatinine high, GFR low  Comprehensive metabolic panel(!) [EW]  1630 CBC with Differential(!) [EW]  1631 Normal except hemoglobin, ketones, protein, leukocytes, RBC, white cells, and squamous epithelials, all present.  Urinalysis, Routine w reflex microscopic(!) [EW]  1650 Magnesium [EW]    Clinical Course User Index [EW] Mancel Bale, MD        Patient Vitals for the past 24 hrs:  BP Temp Temp src Pulse Resp SpO2 Height Weight  07/01/18 2128 (!) 128/104 - - (!) 118 20 96 % - -  07/01/18 1957 (!) 152/78 - - (!) 106 (!) 29 95 % - -  07/01/18 1928 - 98.4 F (36.9 C) Oral - - - - -  07/01/18 1835 (!) 176/73 - - (!) 121 (!) 33 95 % - -  07/01/18 1649 (!) 142/66 - - (!) 104 (!) 23 94 % - -  07/01/18 1547 (!) 167/89 - - (!) 117 (!) 23 98 % - -  07/01/18 1440 - - - - - -  5' 2.5" (1.588 m) 89.8 kg  07/01/18 1438 (!) 149/85 98.2 F (36.8 C) Oral (!) 120 (!) 24 93 % - -    10:03 PM Reevaluation with update and discussion. After initial assessment and treatment, an updated evaluation reveals she continues to have difficulty tolerating both liquids and solids without causing nausea and vomiting.  Repeat orthostatic vital signs were attempted, she was unable to stand because of weakness.  Findings discussed with her, and all questions were answered. Mancel Bale   Medical Decision Making: Nausea, vomiting and diarrhea, with signs and symptoms of a urinary tract infection.  Doubt sepsis, metabolic instability or impending vascular collapse.  Doubt Covid-19 infection.  Current protocol is to screen for COVID on all admitted patients.  Additional medication ordered, Rocephin, along with 3 L of saline.  Heart rate and respiratory rate improved but not normal.  Patient has a history  of tachybradycardia syndrome, without significant hemodynamic abnormality at this time.  Patient requires hospitalization for symptomatic infection.  Gabrielle [name] was evaluated in Emergency Department on 07/01/2018 for the symptoms described in the history of present illness. She was evaluated in the context of the global COVID-19 pandemic, which necessitated consideration that the patient might be at risk for infection with the SARS-CoV-2 virus that causes COVID-19. Institutional protocols and algorithms that pertain to the evaluation of patients at risk for COVID-19 are in a state of rapid change based on information released by regulatory bodies including the CDC and federal and state organizations. These policies and algorithms were followed during the patient's care in the ED.   CRITICAL CARE-yes Performed by: Mancel Bale  Nursing Notes Reviewed/ Care Coordinated Applicable Imaging Reviewed Interpretation of Laboratory Data incorporated into ED treatment  10:05 PM-Consult complete with hospitalist. Patient case explained and discussed. He agrees to admit patient for further evaluation and treatment. Call ended at 10:15 PM  Plan: Admit  Final Clinical Impressions(s) / ED Diagnoses   Final diagnoses:  Urinary tract infection without hematuria, site unspecified  Nausea vomiting and diarrhea  Renal insufficiency    ED Discharge Orders    None       Mancel Bale, MD 07/01/18 2216

## 2018-07-01 NOTE — ED Notes (Signed)
Bed: WA03 Expected date:  Expected time:  Means of arrival:  Comments: EMS-forgot

## 2018-07-01 NOTE — ED Triage Notes (Signed)
Per [facility] pt from home for n/v, cough, and loss of appetite, SOB, high BP for week.  Didn't follow up with her PCP this week per instruction. Pt has not had her medications today. Pt in afib per EMS. 220 palpated, 230/120, HR 116ish in afib to 130s. Pt has pacemaker. 97% on n/c on 3L of O2.

## 2018-07-01 NOTE — H&P (Signed)
History and Physical   [name] EAV:[id number] DOB: 1945/06/07 DOA: 07/01/2018  Referring MD/NP/PA: Dr. Effie ShyWentz  PCP: Henrine Screwshacker, Robert, MD   Outpatient Specialists: None  Patient coming from: Home  Chief Complaint: Persistent nausea and vomiting for a day  HPI: [place] [name] is a 73 y.o. female with medical history significant of paroxysmal atrial fibrillation, asthma, COPD, depression, coronary artery disease, hypertension, hyperlipidemia, diastolic dysfunction CHF who came to the ER today with persistent nausea vomiting.  Patient apparently ate some [place] sandwich and since then has been having persistent nausea with vomiting.  She was seen in the ER spent 7 hours getting treatment but no relief.  She was given him multiple doses of Zofran Ativan and Haldol.  Patient has not stopped vomiting and is therefore being admitted to the hospital for symptomatic management.  Patient also reported cough shortness of breath in addition to the vomiting.  She has had some diarrhea.  Which has stopped at the moment.  Denied any fever or chills.  Patient was seen to be bradycardic initially on arrival but has had history of tachybradycardia syndrome.  At this point she is being admitted to the hospital for symptomatic management..  ED Course: Initial temperature 98 for blood pressure 176/73 pulse 135 respiratory rate of 33 oxygen sat 93% room air.Sodium 140 potassium 3.7.  CO2 20 glucose 184.  Creatinine is 1.24.  Magnesium 2.0.  White count is 22.2 thousand.  The rest of the CBC appears to be within normal.  Urinalysis showed large leukocytes WBC more than 50.  COVID-19 testing is negative.  Patient being admitted for treatment of intractable nausea vomiting as well as UTI.  Review of Systems: As per HPI otherwise 10 point review of systems negative.    Past Medical History:  Diagnosis Date  . Anemia   . Arthritis   . Asthma   . Atrial fibrillation with controlled ventricular response (HCC)  11/2009   a. not on anticoagulation due to recurrent GI bleeding while on Eliquis.  . Chronic bronchitis   . Chronic diastolic CHF (congestive heart failure) (HCC)    a. Echo 12/2015: EF 55-60% w/ Grade 3 DD  . Chronic headache   . COPD (chronic obstructive pulmonary disease) (HCC)   . Depression   . Heart attack (HCC)   . Hyperlipidemia   . Hypertension     Past Surgical History:  Procedure Laterality Date  . CAD-CABG     x7, brief post-op Atrial Fib  . CESAREAN SECTION     x2  . COLONOSCOPY WITH PROPOFOL N/A 10/24/2015   Procedure: COLONOSCOPY WITH PROPOFOL;  Surgeon: Carman ChingJames Edwards, MD;  [facility];  Service: Endoscopy;  Laterality: N/A;  . CORONARY ARTERY BYPASS GRAFT  2011  . EP IMPLANTABLE DEVICE N/A 12/26/2015   Procedure: Pacemaker Implant;  Surgeon: Marinus MawGregg W Taylor, MD;  [facility];  Service: Cardiovascular;  Laterality: N/A;  . ESOPHAGOGASTRODUODENOSCOPY N/A 10/22/2015   Procedure: ESOPHAGOGASTRODUODENOSCOPY (EGD);  Surgeon: Carman ChingJames Edwards, MD;  [facility];  Service: Endoscopy;  Laterality: N/A;  . ESOPHAGOGASTRODUODENOSCOPY N/A 12/24/2015   Procedure: ESOPHAGOGASTRODUODENOSCOPY (EGD);  Surgeon: Carman ChingJames Edwards, MD;  [facility];  Service: Endoscopy;  Laterality: N/A;  . JOINT REPLACEMENT Bilateral   . TONSILLECTOMY    . TOTAL KNEE ARTHROPLASTY       reports that she has been smoking cigarettes. She has been smoking about 0.20 packs per day. She has never used smokeless tobacco. She reports that she  does not drink alcohol or use drugs.  Allergies  Allergen Reactions  . Novocain [Procaine] Hives and Palpitations  . Penicillins Hives and Other (See Comments)    Has patient had a PCN reaction causing immediate rash, facial/tongue/throat swelling, SOB or lightheadedness with hypotension: No Has patient had a PCN reaction causing severe rash involving mucus membranes or skin necrosis: No Has patient had a PCN reaction that  required hospitalization No Has patient had a PCN reaction occurring within the last 10 years: No If all of the above answers are "NO", then may proceed with Cephalosporin use.    Family History  Problem Relation Age of Onset  . Diabetes Mother   . Hypertension Mother   . Heart disease Mother        before age 79  . Heart disease Father        before age 63  . Hypertension Father   . Heart attack Father   . Lung cancer Maternal Grandfather   . Lung cancer Paternal Grandfather   . Hypertension Daughter   . Stroke Paternal Grandmother      Prior to Admission medications   Medication Sig Start Date End Date Taking? Authorizing Provider  Acetaminophen (TYLENOL 8 HOUR ARTHRITIS PAIN PO) Take 2 tablets by mouth 2 (two) times daily. Back pain   Yes [provider]  albuterol (PROVENTIL HFA;VENTOLIN HFA) 108 (90 Base) MCG/ACT inhaler Inhale 2 puffs into the lungs every 6 (six) hours as needed for wheezing or shortness of breath.   Yes [provider]  ALPRAZolam Prudy Feeler) 1 MG tablet Take 1 mg by mouth at bedtime. 06/26/18  Yes [provider]  atorvastatin (LIPITOR) 20 MG tablet Take 1 tablet (20 mg total) by mouth daily. 10/27/17  Yes Rosalio Macadamia, NP  budesonide-formoterol (SYMBICORT) 160-4.5 MCG/ACT inhaler Inhale 2 puffs into the lungs 2 (two) times daily. 01/15/14  Yes Storm Frisk, MD  citalopram (CELEXA) 20 MG tablet Take 20 mg by mouth daily. 07/08/16  Yes [provider]  COSENTYX SENSOREADY 300 DOSE 150 MG/ML SOAJ as directed.  09/30/16  Yes [provider]  docusate sodium (COLACE) 100 MG capsule Take 100 mg by mouth 2 (two) times daily.    Yes [provider]  fluticasone (FLONASE) 50 MCG/ACT nasal spray Place 2 sprays into both nostrils daily. 12/21/15  Yes Albertine Grates, MD  furosemide (LASIX) 20 MG tablet Take 1 tablet (20 mg total) by mouth daily. 10/27/17  Yes Rosalio Macadamia, NP  pantoprazole (PROTONIX) 40 MG tablet Take 1  tablet (40 mg total) by mouth daily before breakfast. 10/27/17  Yes Rosalio Macadamia, NP  potassium chloride SA (K-DUR,KLOR-CON) 20 MEQ tablet Take 1 tablet (20 mEq total) by mouth daily. 10/27/17  Yes Rosalio Macadamia, NP  tiZANidine (ZANAFLEX) 4 MG tablet Take 4 mg by mouth at bedtime as needed for muscle spasms. 07/12/16  Yes [provider]  clobetasol cream (TEMOVATE) 0.05 % Apply 1 application topically 2 (two) times daily. Affected skin Patient not taking: Reported on 07/01/2018 05/21/16   Vassie Loll, MD  metoprolol tartrate (LOPRESSOR) 50 MG tablet Take 1 tablet (50 mg total) by mouth at bedtime. May take an additional 50 mg (one tablet) as needed for palpitations. Patient not taking: Reported on 07/01/2018 10/27/17 01/25/18  Rosalio Macadamia, NP  ranitidine (ZANTAC) 150 MG tablet Take 1 tablet (150 mg total) by mouth at bedtime. Patient not taking: Reported on 07/01/2018 05/21/16   Vassie Loll,  MD    Physical Exam: Vitals:   07/01/18 1928 07/01/18 1957 07/01/18 2128 07/01/18 2215  BP:  (!) 152/78 (!) 128/104 (!) 163/88  Pulse:  (!) 106 (!) 118 (!) 135  Resp:  (!) 29 20 (!) 33  Temp: 98.4 F (36.9 C)     TempSrc: Oral     SpO2:  95% 96% 96%  Weight:      Height:          Constitutional: NAD, calm, acutely ill looking Vitals:   07/01/18 1928 07/01/18 1957 07/01/18 2128 07/01/18 2215  BP:  (!) 152/78 (!) 128/104 (!) 163/88  Pulse:  (!) 106 (!) 118 (!) 135  Resp:  (!) 29 20 (!) 33  Temp: 98.4 F (36.9 C)     TempSrc: Oral     SpO2:  95% 96% 96%  Weight:      Height:       Eyes: PERRL, lids and conjunctivae normal ENMT: Mucous membranes are dry. Posterior pharynx clear of any exudate or lesions.Normal dentition.  Neck: normal, supple, no masses, no thyromegaly Respiratory: clear to auscultation bilaterally, no wheezing, no crackles. Normal respiratory effort. No accessory muscle use.  Cardiovascular: Regular rate and rhythm, no murmurs / rubs / gallops. No  extremity edema. 2+ pedal pulses. No carotid bruits.  Abdomen: no tenderness, no masses palpated. No hepatosplenomegaly. Bowel sounds positive.  Musculoskeletal: no clubbing / cyanosis. No joint deformity upper and lower extremities. Good ROM, no contractures. Normal muscle tone.  Skin: no rashes, lesions, ulcers. No induration Neurologic: CN 2-12 grossly intact. Sensation intact, DTR normal. Strength 5/5 in all 4.  Psychiatric: Normal judgment and insight. Alert and oriented x 3. Normal mood.     Labs on Admission: I have personally reviewed following labs and imaging studies  CBC: Recent Labs  Lab 07/01/18 1558  WBC 22.2*  NEUTROABS 19.5*  HGB 12.1  HCT 39.1  MCV 93.8  PLT 225   Basic Metabolic Panel: Recent Labs  Lab 07/01/18 1558  NA 140  K 3.7  CL 107  CO2 20*  GLUCOSE 184*  BUN 23  CREATININE 1.24*  CALCIUM 10.0  MG 2.0   GFR: Estimated Creatinine Clearance: 41.9 mL/min (A) (by C-G formula based on SCr of 1.24 mg/dL (H)). Liver Function Tests: Recent Labs  Lab 07/01/18 1558  AST 28  ALT 8  ALKPHOS 89  BILITOT 0.5  PROT 7.9  ALBUMIN 4.2   No results for input(s): LIPASE, AMYLASE in the last 168 hours. No results for input(s): AMMONIA in the last 168 hours. Coagulation Profile: No results for input(s): INR, PROTIME in the last 168 hours. Cardiac Enzymes: No results for input(s): CKTOTAL, CKMB, CKMBINDEX, TROPONINI in the last 168 hours. BNP (last 3 results) No results for input(s): PROBNP in the last 8760 hours. HbA1C: No results for input(s): HGBA1C in the last 72 hours. CBG: No results for input(s): GLUCAP in the last 168 hours. Lipid Profile: No results for input(s): CHOL, HDL, LDLCALC, TRIG, CHOLHDL, LDLDIRECT in the last 72 hours. Thyroid Function Tests: No results for input(s): TSH, T4TOTAL, FREET4, T3FREE, THYROIDAB in the last 72 hours. Anemia Panel: No results for input(s): VITAMINB12, FOLATE, FERRITIN, TIBC, IRON, RETICCTPCT in the last  72 hours. Urine analysis:    Component Value Date/Time   COLORURINE YELLOW 07/01/2018 1547   APPEARANCEUR HAZY (A) 07/01/2018 1547   LABSPEC 1.029 07/01/2018 1547   PHURINE 5.0 07/01/2018 1547   GLUCOSEU NEGATIVE 07/01/2018 1547   HGBUR MODERATE (  A) 07/01/2018 1547   BILIRUBINUR NEGATIVE 07/01/2018 1547   KETONESUR 5 (A) 07/01/2018 1547   PROTEINUR 100 (A) 07/01/2018 1547   UROBILINOGEN 1.0 12/02/2010 1023   NITRITE NEGATIVE 07/01/2018 1547   LEUKOCYTESUR LARGE (A) 07/01/2018 1547   Sepsis Labs: (procalcitonin:4,lacticidven:4) )No results found for this or any previous visit (from the past 240 hour(s)).   Radiological Exams on Admission: No results found.   Assessment/Plan Principal Problem:   Nausea and/or vomiting Active Problems:   Hyperlipidemia   NICOTINE ADDICTION   Depression   Atrial fibrillation (HCC)   Obesity   Chronic diastolic CHF (congestive heart failure) (HCC)   Acute lower UTI     #1 intractable nausea with vomiting: Probably acute gastroenteritis after eating [place] sandwich.  Patient will be admitted for symptomatic management.  Control nausea vomiting with Zofran and possibly Ativan.  Depending on response we will start clear liquid diet and advance as tolerated.  No antibiotics for the nausea vomiting.  Patient also had UTI and could be partly responsible for her nausea vomiting.  #2 UTI: Based on presentation.  White count of 22,000 and urinalysis.  She is afebrile.  Initiate IV Rocephin and follow urine and blood cultures and sensitivities.  #3 paroxysmal atrial fibrillation: Continue home regimen.  At this point she is tachycardic probably due to UTI and dehydration.  May control it with Cardizem if no response after fluid resuscitation.  #4 tobacco abuse: Counseling with nicotine patch.  #5 chronic diastolic heart failure: Appears to be compensated at the moment.  #6 depression with anxiety: Continue home regimen.   DVT  prophylaxis: Lovenox Code Status: Full code Family Communication: No family at bedside Disposition Plan: Home Consults called: None Admission status: Observation  Severity of Illness: The appropriate patient status for this patient is OBSERVATION. Observation status is judged to be reasonable and necessary in order to provide the required intensity of service to ensure the patient's safety. The patient's presenting symptoms, physical exam findings, and initial radiographic and laboratory data in the context of their medical condition is felt to place them at decreased risk for further clinical deterioration. Furthermore, it is anticipated that the patient will be medically stable for discharge from the hospital within 2 midnights of admission. The following factors support the patient status of observation.   " The patient's presenting symptoms include nausea with vomiting. " The physical exam findings include mild dehydration. " The initial radiographic and laboratory data are evidence of UTI.     Lonia Blood MD [facility] Pager [phone]  If 7PM-7AM, please contact night-coverage [email or web address] Password [email or web address]  07/01/2018, 11:41 PM

## 2018-07-02 ENCOUNTER — Observation Stay (HOSPITAL_COMMUNITY): Payer: Medicare Other

## 2018-07-02 ENCOUNTER — Other Ambulatory Visit: Payer: Self-pay

## 2018-07-02 DIAGNOSIS — F329 Major depressive disorder, single episode, unspecified: Secondary | ICD-10-CM | POA: Diagnosis not present

## 2018-07-02 DIAGNOSIS — I2583 Coronary atherosclerosis due to lipid rich plaque: Secondary | ICD-10-CM | POA: Diagnosis present

## 2018-07-02 DIAGNOSIS — N289 Disorder of kidney and ureter, unspecified: Secondary | ICD-10-CM | POA: Diagnosis present

## 2018-07-02 DIAGNOSIS — E669 Obesity, unspecified: Secondary | ICD-10-CM | POA: Diagnosis present

## 2018-07-02 DIAGNOSIS — E860 Dehydration: Secondary | ICD-10-CM | POA: Diagnosis present

## 2018-07-02 DIAGNOSIS — I272 Pulmonary hypertension, unspecified: Secondary | ICD-10-CM | POA: Diagnosis present

## 2018-07-02 DIAGNOSIS — F17200 Nicotine dependence, unspecified, uncomplicated: Secondary | ICD-10-CM

## 2018-07-02 DIAGNOSIS — J439 Emphysema, unspecified: Secondary | ICD-10-CM | POA: Diagnosis present

## 2018-07-02 DIAGNOSIS — L409 Psoriasis, unspecified: Secondary | ICD-10-CM | POA: Diagnosis present

## 2018-07-02 DIAGNOSIS — M1990 Unspecified osteoarthritis, unspecified site: Secondary | ICD-10-CM | POA: Diagnosis present

## 2018-07-02 DIAGNOSIS — K529 Noninfective gastroenteritis and colitis, unspecified: Secondary | ICD-10-CM | POA: Diagnosis present

## 2018-07-02 DIAGNOSIS — I4891 Unspecified atrial fibrillation: Secondary | ICD-10-CM | POA: Diagnosis present

## 2018-07-02 DIAGNOSIS — D509 Iron deficiency anemia, unspecified: Secondary | ICD-10-CM | POA: Diagnosis present

## 2018-07-02 DIAGNOSIS — I480 Paroxysmal atrial fibrillation: Secondary | ICD-10-CM | POA: Diagnosis present

## 2018-07-02 DIAGNOSIS — I2510 Atherosclerotic heart disease of native coronary artery without angina pectoris: Secondary | ICD-10-CM | POA: Diagnosis present

## 2018-07-02 DIAGNOSIS — E785 Hyperlipidemia, unspecified: Secondary | ICD-10-CM | POA: Diagnosis present

## 2018-07-02 DIAGNOSIS — E782 Mixed hyperlipidemia: Secondary | ICD-10-CM | POA: Diagnosis not present

## 2018-07-02 DIAGNOSIS — I495 Sick sinus syndrome: Secondary | ICD-10-CM

## 2018-07-02 DIAGNOSIS — I5032 Chronic diastolic (congestive) heart failure: Secondary | ICD-10-CM | POA: Diagnosis not present

## 2018-07-02 DIAGNOSIS — R112 Nausea with vomiting, unspecified: Secondary | ICD-10-CM | POA: Diagnosis present

## 2018-07-02 DIAGNOSIS — Z20828 Contact with and (suspected) exposure to other viral communicable diseases: Secondary | ICD-10-CM | POA: Diagnosis present

## 2018-07-02 DIAGNOSIS — I110 Hypertensive heart disease with heart failure: Secondary | ICD-10-CM | POA: Diagnosis present

## 2018-07-02 DIAGNOSIS — G4700 Insomnia, unspecified: Secondary | ICD-10-CM | POA: Diagnosis present

## 2018-07-02 DIAGNOSIS — I252 Old myocardial infarction: Secondary | ICD-10-CM | POA: Diagnosis not present

## 2018-07-02 DIAGNOSIS — F418 Other specified anxiety disorders: Secondary | ICD-10-CM | POA: Diagnosis present

## 2018-07-02 DIAGNOSIS — F17210 Nicotine dependence, cigarettes, uncomplicated: Secondary | ICD-10-CM | POA: Diagnosis present

## 2018-07-02 DIAGNOSIS — J9601 Acute respiratory failure with hypoxia: Secondary | ICD-10-CM | POA: Diagnosis not present

## 2018-07-02 DIAGNOSIS — N390 Urinary tract infection, site not specified: Secondary | ICD-10-CM | POA: Diagnosis present

## 2018-07-02 DIAGNOSIS — K219 Gastro-esophageal reflux disease without esophagitis: Secondary | ICD-10-CM | POA: Diagnosis present

## 2018-07-02 DIAGNOSIS — I5033 Acute on chronic diastolic (congestive) heart failure: Secondary | ICD-10-CM | POA: Diagnosis not present

## 2018-07-02 HISTORY — DX: Unspecified atrial fibrillation: I48.91

## 2018-07-02 LAB — TROPONIN I
Troponin I: 0.07 ng/mL (ref <0.03)
Troponin I: 0.05 ng/mL (ref <0.03)
Troponin I: 0.04 ng/mL (ref <0.03)

## 2018-07-02 LAB — COMPREHENSIVE METABOLIC PANEL
ALT: 6 U/L (ref 0–44)
AST: 18 U/L (ref 15–41)
Albumin: 3.4 g/dL — ABNORMAL LOW (ref 3.5–5.0)
Alkaline Phosphatase: 66 U/L (ref 38–126)
Anion gap: 7 (ref 5–15)
BUN: 22 mg/dL (ref 8–23)
CO2: 22 mmol/L (ref 22–32)
Calcium: 9 mg/dL (ref 8.9–10.3)
Chloride: 111 mmol/L (ref 98–111)
Creatinine, Ser: 1.08 mg/dL — ABNORMAL HIGH (ref 0.44–1.00)
GFR calc Af Amer: 59 mL/min — ABNORMAL LOW (ref >60)
GFR calc non Af Amer: 51 mL/min — ABNORMAL LOW (ref >60)
Glucose, Bld: 126 mg/dL — ABNORMAL HIGH (ref 70–99)
Potassium: 4.1 mmol/L (ref 3.5–5.1)
Sodium: 140 mmol/L (ref 135–145)
Total Bilirubin: 0.4 mg/dL (ref 0.3–1.2)
Total Protein: 6.4 g/dL — ABNORMAL LOW (ref 6.5–8.1)

## 2018-07-02 LAB — IRON AND TIBC
Iron: 18 ug/dL — ABNORMAL LOW (ref 28–170)
Saturation Ratios: 4 % — ABNORMAL LOW (ref 10.4–31.8)
TIBC: 402 ug/dL (ref 250–450)
UIBC: 384 ug/dL

## 2018-07-02 LAB — TSH: TSH: 0.475 u[IU]/mL (ref 0.350–4.500)

## 2018-07-02 LAB — VITAMIN B12: Vitamin B-12: 218 pg/mL (ref 180–914)

## 2018-07-02 LAB — MAGNESIUM: Magnesium: 2.1 mg/dL (ref 1.7–2.4)

## 2018-07-02 LAB — FOLATE: Folate: 6 ng/mL (ref >5.9)

## 2018-07-02 LAB — FERRITIN: Ferritin: 20 ng/mL (ref 11–307)

## 2018-07-02 LAB — SARS CORONAVIRUS 2 BY RT PCR (HOSPITAL ORDER, PERFORMED IN ~~LOC~~ HOSPITAL LAB): SARS Coronavirus 2: NEGATIVE

## 2018-07-02 MED ORDER — ATORVASTATIN CALCIUM 20 MG PO TABS
20.0000 mg | ORAL_TABLET | Freq: Every day | ORAL | Status: DC
  Administered 2018-07-02 – 2018-07-05 (×4): 20 mg via ORAL
  Filled 2018-07-02: qty 2
  Filled 2018-07-02: qty 1
  Filled 2018-07-02 (×2): qty 2

## 2018-07-02 MED ORDER — TIZANIDINE HCL 4 MG PO TABS
4.0000 mg | ORAL_TABLET | Freq: Every evening | ORAL | Status: DC | PRN
  Administered 2018-07-02: 4 mg via ORAL
  Filled 2018-07-02: qty 1

## 2018-07-02 MED ORDER — ONDANSETRON HCL 4 MG PO TABS: 4.0000 mg | ORAL_TABLET | Freq: Four times a day (QID) | ORAL | Status: DC | PRN

## 2018-07-02 MED ORDER — DILTIAZEM HCL 100 MG IV SOLR
5.0000 mg/h | INTRAVENOUS | Status: DC
  Administered 2018-07-02: 12 mg/h via INTRAVENOUS
  Administered 2018-07-02: 5 mg/h via INTRAVENOUS
  Filled 2018-07-02 (×2): qty 100

## 2018-07-02 MED ORDER — SODIUM CHLORIDE 0.9 % IV SOLN
1.0000 g | INTRAVENOUS | Status: DC
  Administered 2018-07-02: 1 g via INTRAVENOUS
  Filled 2018-07-02 (×2): qty 10
  Filled 2018-07-02: qty 1

## 2018-07-02 MED ORDER — NICOTINE 21 MG/24HR TD PT24
21.0000 mg | MEDICATED_PATCH | Freq: Every day | TRANSDERMAL | Status: DC
  Filled 2018-07-02 (×5): qty 1

## 2018-07-02 MED ORDER — DILTIAZEM HCL-DEXTROSE 100-5 MG/100ML-% IV SOLN (PREMIX): 5.0000 mg/h | INTRAVENOUS | Status: DC

## 2018-07-02 MED ORDER — PANTOPRAZOLE SODIUM 40 MG PO TBEC
40.0000 mg | DELAYED_RELEASE_TABLET | Freq: Every day | ORAL | Status: DC
  Administered 2018-07-02 – 2018-07-06 (×5): 40 mg via ORAL
  Filled 2018-07-02 (×5): qty 1

## 2018-07-02 MED ORDER — ACETAMINOPHEN 325 MG PO TABS
650.0000 mg | ORAL_TABLET | Freq: Two times a day (BID) | ORAL | Status: DC
  Administered 2018-07-02: 650 mg via ORAL
  Filled 2018-07-02: qty 2

## 2018-07-02 MED ORDER — ONDANSETRON HCL 4 MG/2ML IJ SOLN: 4.0000 mg | Freq: Four times a day (QID) | INTRAMUSCULAR | Status: DC | PRN

## 2018-07-02 MED ORDER — ALPRAZOLAM 1 MG PO TABS
1.0000 mg | ORAL_TABLET | Freq: Every day | ORAL | Status: DC
  Administered 2018-07-02 – 2018-07-05 (×5): 1 mg via ORAL
  Filled 2018-07-02: qty 2
  Filled 2018-07-02 (×4): qty 1

## 2018-07-02 MED ORDER — ALBUTEROL SULFATE (2.5 MG/3ML) 0.083% IN NEBU: 3.0000 mL | INHALATION_SOLUTION | Freq: Four times a day (QID) | RESPIRATORY_TRACT | Status: DC | PRN

## 2018-07-02 MED ORDER — FUROSEMIDE 20 MG PO TABS
20.0000 mg | ORAL_TABLET | Freq: Every day | ORAL | Status: DC
  Administered 2018-07-02 – 2018-07-04 (×3): 20 mg via ORAL
  Filled 2018-07-02 (×3): qty 1

## 2018-07-02 MED ORDER — METOPROLOL TARTRATE 25 MG PO TABS
25.0000 mg | ORAL_TABLET | Freq: Two times a day (BID) | ORAL | Status: DC
  Administered 2018-07-02 – 2018-07-03 (×3): 25 mg via ORAL
  Filled 2018-07-02 (×3): qty 1

## 2018-07-02 MED ORDER — DEXTROSE-NACL 5-0.45 % IV SOLN
INTRAVENOUS | Status: DC
  Administered 2018-07-02 (×2): via INTRAVENOUS

## 2018-07-02 MED ORDER — ENOXAPARIN SODIUM 40 MG/0.4ML ~~LOC~~ SOLN
40.0000 mg | Freq: Every day | SUBCUTANEOUS | Status: DC
  Administered 2018-07-02 – 2018-07-06 (×4): 40 mg via SUBCUTANEOUS
  Filled 2018-07-02 (×5): qty 0.4

## 2018-07-02 MED ORDER — CITALOPRAM HYDROBROMIDE 20 MG PO TABS
20.0000 mg | ORAL_TABLET | Freq: Every day | ORAL | Status: DC
  Administered 2018-07-02 – 2018-07-06 (×5): 20 mg via ORAL
  Filled 2018-07-02 (×5): qty 1

## 2018-07-02 MED ORDER — FLUTICASONE PROPIONATE 50 MCG/ACT NA SUSP
2.0000 | Freq: Every day | NASAL | Status: DC
  Administered 2018-07-02 – 2018-07-06 (×5): 2 via NASAL
  Filled 2018-07-02: qty 16

## 2018-07-02 MED ORDER — DILTIAZEM HCL-DEXTROSE 100-5 MG/100ML-% IV SOLN (PREMIX)
5.0000 mg/h | INTRAVENOUS | Status: DC
  Filled 2018-07-02: qty 100

## 2018-07-02 MED ORDER — DOCUSATE SODIUM 100 MG PO CAPS
100.0000 mg | ORAL_CAPSULE | Freq: Two times a day (BID) | ORAL | Status: DC
  Administered 2018-07-02 – 2018-07-06 (×9): 100 mg via ORAL
  Filled 2018-07-02 (×9): qty 1

## 2018-07-02 MED ORDER — MOMETASONE FURO-FORMOTEROL FUM 200-5 MCG/ACT IN AERO
2.0000 | INHALATION_SPRAY | Freq: Two times a day (BID) | RESPIRATORY_TRACT | Status: DC
  Administered 2018-07-02 – 2018-07-06 (×9): 2 via RESPIRATORY_TRACT
  Filled 2018-07-02: qty 8.8

## 2018-07-02 MED ORDER — DILTIAZEM HCL 25 MG/5ML IV SOLN
10.0000 mg | Freq: Once | INTRAVENOUS | Status: AC
  Administered 2018-07-02: 10 mg via INTRAVENOUS
  Filled 2018-07-02: qty 5

## 2018-07-02 MED ORDER — POTASSIUM CHLORIDE CRYS ER 20 MEQ PO TBCR
20.0000 meq | EXTENDED_RELEASE_TABLET | Freq: Every day | ORAL | Status: DC
  Administered 2018-07-02 – 2018-07-06 (×5): 20 meq via ORAL
  Filled 2018-07-02 (×5): qty 1

## 2018-07-02 MED ORDER — ACETAMINOPHEN 325 MG PO TABS
650.0000 mg | ORAL_TABLET | Freq: Four times a day (QID) | ORAL | Status: DC | PRN
  Administered 2018-07-02 – 2018-07-05 (×7): 650 mg via ORAL
  Filled 2018-07-02 (×7): qty 2

## 2018-07-02 NOTE — ED Notes (Signed)
ED TO INPATIENT HANDOFF REPORT  ED Nurse Name and Phone #: Almond Lint Name/Age/Gender Gabrielle [name] 73 y.o. female Room/Bed: WA03/WA03  Code Status   Code Status: Prior  Home/SNF/Other Given to floor Patient oriented to: self, place, time and situation Is this baseline? Yes   Triage Complete: Triage complete  Chief Complaint shob  Triage Note Per [facility] pt from home for n/v, cough, and loss of appetite, SOB, high BP for week.  Didn't follow up with her PCP this week per instruction. Pt has not had her medications today. Pt in afib per EMS. 220 palpated, 230/120, HR 116ish in afib to 130s. Pt has pacemaker. 97% on n/c on 3L of O2.    Allergies Allergies  Allergen Reactions  . Novocain [Procaine] Hives and Palpitations  . Penicillins Hives and Other (See Comments)    Has patient had a PCN reaction causing immediate rash, facial/tongue/throat swelling, SOB or lightheadedness with hypotension: No Has patient had a PCN reaction causing severe rash involving mucus membranes or skin necrosis: No Has patient had a PCN reaction that required hospitalization No Has patient had a PCN reaction occurring within the last 10 years: No If all of the above answers are "NO", then may proceed with Cephalosporin use.    Level of Care/Admitting Diagnosis ED Disposition    ED Disposition Condition Comment   Admit  Hospital Area: [facility] [[id number]]  Level of Care: Med-Surg [16]  Covid Evaluation: N/A  Diagnosis: Nausea and/or vomiting [201718]  Admitting Physician: Rometta Emery [[id number]]  Attending Physician: Rometta Emery [[id number]]  PT Class (Do Not Modify): Observation [104]  PT Acc Code (Do Not Modify): Observation [10022]       B Medical/Surgery History Past Medical History:  Diagnosis Date  . Anemia   . Arthritis   . Asthma   . Atrial fibrillation with controlled ventricular response (HCC) 11/2009   a. not on anticoagulation due to recurrent  GI bleeding while on Eliquis.  . Chronic bronchitis   . Chronic diastolic CHF (congestive heart failure) (HCC)    a. Echo 12/2015: EF 55-60% w/ Grade 3 DD  . Chronic headache   . COPD (chronic obstructive pulmonary disease) (HCC)   . Depression   . Heart attack (HCC)   . Hyperlipidemia   . Hypertension    Past Surgical History:  Procedure Laterality Date  . CAD-CABG     x7, brief post-op Atrial Fib  . CESAREAN SECTION     x2  . COLONOSCOPY WITH PROPOFOL N/A 10/24/2015   Procedure: COLONOSCOPY WITH PROPOFOL;  Surgeon: Carman Ching, MD;  Location: [facility] ENDOSCOPY;  Service: Endoscopy;  Laterality: N/A;  . CORONARY ARTERY BYPASS GRAFT  2011  . EP IMPLANTABLE DEVICE N/A 12/26/2015   Procedure: Pacemaker Implant;  Surgeon: Marinus Maw, MD;  Location: [facility] INVASIVE CV LAB;  Service: Cardiovascular;  Laterality: N/A;  . ESOPHAGOGASTRODUODENOSCOPY N/A 10/22/2015   Procedure: ESOPHAGOGASTRODUODENOSCOPY (EGD);  Surgeon: Carman Ching, MD;  Location: [facility] ENDOSCOPY;  Service: Endoscopy;  Laterality: N/A;  . ESOPHAGOGASTRODUODENOSCOPY N/A 12/24/2015   Procedure: ESOPHAGOGASTRODUODENOSCOPY (EGD);  Surgeon: Carman Ching, MD;  Location: [facility] ENDOSCOPY;  Service: Endoscopy;  Laterality: N/A;  . JOINT REPLACEMENT Bilateral   . TONSILLECTOMY    . TOTAL KNEE ARTHROPLASTY       A IV Location/Drains/Wounds Patient Lines/Drains/Airways Status   Active Line/Drains/Airways    Name:   Placement date:   Placement time:   Site:   Days:  Peripheral IV 07/01/18 Right Forearm   07/01/18    1552    Forearm   1   Pressure Injury 12/24/15 Stage I -  Intact skin with non-blanchable redness of a localized area usually over a bony prominence.   12/24/15    1730     921          Intake/Output Last 24 hours  Intake/Output Summary (Last 24 hours) at 07/02/2018 0246 Last data filed at 07/02/2018 0005 Gross per 24 hour  Intake 2100 ml  Output -  Net 2100 ml    Labs/Imaging Results for orders placed or performed  during the hospital encounter of 07/01/18 (from the past 48 hour(s))  Urinalysis, Routine w reflex microscopic     Status: Abnormal   Collection Time: 07/01/18  3:47 PM  Result Value Ref Range   Color, Urine YELLOW YELLOW   APPearance HAZY (A) CLEAR   Specific Gravity, Urine 1.029 1.005 - 1.030   pH 5.0 5.0 - 8.0   Glucose, UA NEGATIVE NEGATIVE mg/dL   Hgb urine dipstick MODERATE (A) NEGATIVE   Bilirubin Urine NEGATIVE NEGATIVE   Ketones, ur 5 (A) NEGATIVE mg/dL   Protein, ur 161100 (A) NEGATIVE mg/dL   Nitrite NEGATIVE NEGATIVE   Leukocytes,Ua LARGE (A) NEGATIVE   RBC / HPF 11-20 0 - 5 RBC/hpf   WBC, UA >50 (H) 0 - 5 WBC/hpf   Bacteria, UA RARE (A) NONE SEEN   Squamous Epithelial / LPF 11-20 0 - 5    Comment: Performed at [facility], [address] [place], [place] [address]  Comprehensive metabolic panel     Status: Abnormal   Collection Time: 07/01/18  3:58 PM  Result Value Ref Range   Sodium 140 135 - 145 mmol/L   Potassium 3.7 3.5 - 5.1 mmol/L   Chloride 107 98 - 111 mmol/L   CO2 20 (L) 22 - 32 mmol/L   Glucose, Bld 184 (H) 70 - 99 mg/dL   BUN 23 8 - 23 mg/dL   Creatinine, Ser 5.401.24 (H) 0.44 - 1.00 mg/dL   Calcium 98.110.0 8.9 - 19.110.3 mg/dL   Total Protein 7.9 6.5 - 8.1 g/dL   Albumin 4.2 3.5 - 5.0 g/dL   AST 28 15 - 41 U/L   ALT 8 0 - 44 U/L   Alkaline Phosphatase 89 38 - 126 U/L   Total Bilirubin 0.5 0.3 - 1.2 mg/dL   GFR calc non Af Amer 43 (L) >60 mL/min   GFR calc Af Amer 50 (L) >60 mL/min   Anion gap 13 5 - 15    Comment: Performed at [facility], [address] [place], [place] [address]  CBC with Differential     Status: Abnormal   Collection Time: 07/01/18  3:58 PM  Result Value Ref Range   WBC 22.2 (H) 4.0 - 10.5 K/uL    Comment: WHITE COUNT CONFIRMED ON SMEAR   RBC 4.17 3.87 - 5.11 MIL/uL   Hemoglobin 12.1 12.0 - 15.0 g/dL   HCT 56.239.1 13.036.0 - 86.546.0 %   MCV 93.8 80.0 - 100.0 fL   MCH 29.0 26.0 - 34.0 pg   MCHC 30.9 30.0  - 36.0 g/dL   RDW 78.416.9 (H) 69.611.5 - 29.515.5 %   Platelets 225 150 - 400 K/uL   nRBC 0.0 0.0 - 0.2 %   Neutrophils Relative % 88 %   Neutro Abs 19.5 (H) 1.7 - 7.7 K/uL   Lymphocytes Relative  4 %   Lymphs Abs 0.8 0.7 - 4.0 K/uL   Monocytes Relative 7 %   Monocytes Absolute 1.5 (H) 0.1 - 1.0 K/uL   Eosinophils Relative 0 %   Eosinophils Absolute 0.1 0.0 - 0.5 K/uL   Basophils Relative 0 %   Basophils Absolute 0.0 0.0 - 0.1 K/uL   Immature Granulocytes 1 %   Abs Immature Granulocytes 0.22 (H) 0.00 - 0.07 K/uL    Comment: Performed at [facility], [address], [place], [place] [address]  Magnesium     Status: None   Collection Time: 07/01/18  3:58 PM  Result Value Ref Range   Magnesium 2.0 1.7 - 2.4 mg/dL    Comment: Performed at [facility], [address] [place], [place] [address]  SARS Coronavirus 2 (CEPHEID - Performed in [facility] hospital lab), Hosp Order     Status: None   Collection Time: 07/01/18 10:00 PM  Result Value Ref Range   SARS Coronavirus 2 NEGATIVE NEGATIVE    Comment: (NOTE) If result is NEGATIVE SARS-CoV-2 target nucleic acids are NOT DETECTED. The SARS-CoV-2 RNA is generally detectable in upper and lower  respiratory specimens during the acute phase of infection. The lowest  concentration of SARS-CoV-2 viral copies this assay can detect is 250  copies / mL. A negative result does not preclude SARS-CoV-2 infection  and should not be used as the sole basis for treatment or other  patient management decisions.  A negative result may occur with  improper specimen collection / handling, submission of specimen other  than nasopharyngeal swab, presence of viral mutation(s) within the  areas targeted by this assay, and inadequate number of viral copies  (<250 copies / mL). A negative result must be combined with clinical  observations, patient history, and epidemiological information. If result is POSITIVE SARS-CoV-2 target  nucleic acids are DETECTED. The SARS-CoV-2 RNA is generally detectable in upper and lower  respiratory specimens dur ing the acute phase of infection.  Positive  results are indicative of active infection with SARS-CoV-2.  Clinical  correlation with patient history and other diagnostic information is  necessary to determine patient infection status.  Positive results do  not rule out bacterial infection or co-infection with other viruses. If result is PRESUMPTIVE POSTIVE SARS-CoV-2 nucleic acids MAY BE PRESENT.   A presumptive positive result was obtained on the submitted specimen  and confirmed on repeat testing.  While 2019 novel coronavirus  (SARS-CoV-2) nucleic acids may be present in the submitted sample  additional confirmatory testing may be necessary for epidemiological  and / or clinical management purposes  to differentiate between  SARS-CoV-2 and other Sarbecovirus currently known to infect humans.  If clinically indicated additional testing with an alternate test  methodology [phone]) is advised. The SARS-CoV-2 RNA is generally  detectable in upper and lower respiratory sp ecimens during the acute  phase of infection. The expected result is Negative. Fact Sheet for Patients:  BoilerBrush.com.cy Fact Sheet for Healthcare Providers: https://pope.com/ This test is not yet approved or cleared by the [place] FDA and has been authorized for detection and/or diagnosis of SARS-CoV-2 by FDA under an Emergency Use Authorization (EUA).  This EUA will remain in effect (meaning this test can be used) for the duration of the COVID-19 declaration under Section 564(b)(1) of the Act, 21 U.S.C. section 360bbb-3(b)(1), unless the authorization is terminated or revoked sooner. Performed at [facility], [address] [place], [place] [address]  No results found.  Pending Labs Unresulted Labs (From admission,  onward)    Start     Ordered   07/01/18 2200  Urine culture  ONCE - STAT,   STAT     07/01/18 2159   Signed and Held  CBC  (enoxaparin (LOVENOX)    CrCl >/= 30 ml/min)  Once,   R    Comments:  Baseline for enoxaparin therapy IF NOT ALREADY DRAWN.  Notify MD if PLT < 100 K.    Signed and Held   Signed and Held  Creatinine, serum  (enoxaparin (LOVENOX)    CrCl >/= 30 ml/min)  Once,   R    Comments:  Baseline for enoxaparin therapy IF NOT ALREADY DRAWN.    Signed and Held   Signed and Held  Creatinine, serum  (enoxaparin (LOVENOX)    CrCl >/= 30 ml/min)  Weekly,   R    Comments:  while on enoxaparin therapy    Signed and Held   Signed and Held  Comprehensive metabolic panel  Tomorrow morning,   R     Signed and Held   Signed and Held  CBC  Tomorrow morning,   R     Signed and Held          Vitals/Pain Today's Vitals   07/01/18 1957 07/01/18 2128 07/01/18 2215 07/02/18 0231  BP: (!) 152/78 (!) 128/104 (!) 163/88 126/86  Pulse: (!) 106 (!) 118 (!) 135 (!) 110  Resp: (!) 29 20 (!) 33 15  Temp:      TempSrc:      SpO2: 95% 96% 96% 97%  Weight:      Height:      PainSc:    Asleep    Isolation Precautions Droplet precaution  Medications Medications  sodium chloride 0.9 % bolus 1,000 mL (has no administration in time range)  tiZANidine (ZANAFLEX) tablet 4 mg (4 mg Oral Given 07/02/18 0020)  ALPRAZolam (XANAX) tablet 1 mg (1 mg Oral Given 07/02/18 0019)  nicotine (NICODERM CQ - dosed in mg/24 hours) patch 21 mg (has no administration in time range)  ondansetron (ZOFRAN) injection 4 mg (4 mg Intravenous Given 07/01/18 1631)  sodium chloride 0.9 % bolus 1,000 mL (0 mLs Intravenous Stopped 07/01/18 1927)  cephALEXin (KEFLEX) capsule 500 mg (500 mg Oral Given 07/01/18 1746)  sodium chloride 0.9 % bolus 1,000 mL (0 mLs Intravenous Stopped 07/01/18 2310)  ondansetron (ZOFRAN) injection 4 mg (4 mg Intravenous Given 07/01/18 2118)  cefTRIAXone (ROCEPHIN) 1 g in sodium chloride 0.9 % 100 mL IVPB  (0 g Intravenous Stopped 07/02/18 0005)    Mobility walks with person assist Moderate fall risk   Focused Assessments Cardiac Assessment Handoff:  Cardiac Rhythm: Atrial fibrillation Lab Results  Component Value Date   CKTOTAL 30 11/28/2009   CKMB 1.2 11/28/2009   TROPONINI <0.03 08/20/2016   No results found for: DDIMER Does the Patient currently have chest pain? No  , Pulmonary Assessment Handoff:  Lung sounds: Bilateral Breath Sounds: Diminished O2 Device: Nasal Cannula O2 Flow Rate (L/min): 3 L/min      R Recommendations: See Admitting Provider Note  Report given to:   Additional Notes:

## 2018-07-02 NOTE — Progress Notes (Signed)
Patient's heart rate sustaining between 140s-175s. Patient states that she takes metoprolol at home and was not given the does the day before. MD paged. Patient is no acute distress. RN will continue to monitor.

## 2018-07-02 NOTE — Progress Notes (Signed)
Patient's HR sustaining between 140s-175s. MD paged. Vital signs normal. Patient denies any chest pain. EKG performed. Patient in Afib with RVR. Rapid Response called. RN at bedside.

## 2018-07-02 NOTE — Progress Notes (Addendum)
PROGRESS NOTE    COURTANY MCMURPHY  WUJ:[id number] DOB: Jul 05, 1945 DOA: 07/01/2018 PCP: Henrine Screws, MD   Brief Narrative:  HPI per Dr. Aundria Mems AYDAN PHOENIX is a 73 y.o. female with medical history significant of paroxysmal atrial fibrillation, asthma, COPD, depression, coronary artery disease, hypertension, hyperlipidemia, diastolic dysfunction CHF who came to the ER today with persistent nausea vomiting.  Patient apparently ate some [place] sandwich and since then has been having persistent nausea with vomiting.  She was seen in the ER spent 7 hours getting treatment but no relief.  She was given him multiple doses of Zofran Ativan and Haldol.  Patient has not stopped vomiting and is therefore being admitted to the hospital for symptomatic management.  Patient also reported cough shortness of breath in addition to the vomiting.  She has had some diarrhea.  Which has stopped at the moment.  Denied any fever or chills.  Patient was seen to be bradycardic initially on arrival but has had history of tachybradycardia syndrome.  At this point she is being admitted to the hospital for symptomatic management..  ED Course: Initial temperature 98 for blood pressure 176/73 pulse 135 respiratory rate of 33 oxygen sat 93% room air.Sodium 140 potassium 3.7.  CO2 20 glucose 184.  Creatinine is 1.24.  Magnesium 2.0.  White count is 22.2 thousand.  The rest of the CBC appears to be within normal.  Urinalysis showed large leukocytes WBC more than 50.  COVID-19 testing is negative.  Patient being admitted for treatment of intractable nausea vomiting as well as UTI.  Patient was admitted placed on IV fluids and supportive care.  The morning of 07/02/2018 patient was noted to go into A. fib with RVR with heart rate sustaining in the 170s.  Patient given a 10 mg IV push of Cardizem however heart rate remained still elevated in addition to Lopressor 25 mg x 1.  Patient subsequently transferred to the stepdown unit  and placed on a Cardizem drip.   Assessment & Plan:   Principal Problem:   Atrial fibrillation with RVR (HCC) Active Problems:   Nausea and/or vomiting   Hyperlipidemia   NICOTINE ADDICTION   Depression   Atrial fibrillation (HCC)   Obesity   PAF (paroxysmal atrial fibrillation) (HCC)   Tachy-brady syndrome (HCC)   Chronic diastolic CHF (congestive heart failure) (HCC)   Acute lower UTI   1 A. fib with RVR Questionable etiology.  Patient did state was on Lopressor 50 mg nightly however patient did not receive Lopressor on admission.  Patient denies any chest pain or shortness of breath.  Check a chest x-ray.  Cycle cardiac enzymes every 6 hours x3.  Check a TSH.  EKG consistent with A. fib with RVR.  Patient with 2D echo done on 05/17/2016 with a EF of 55%, akinesis of apical myocardium, basal inferior myocardium, apical anterior myocardium, grade 3 diastolic dysfunction, mild aortic valvular regurgitation, moderate mitral valvular regurgitation, right ventricular systolic pressure increased consistent with moderate pulmonary hypertension.  Patient given 10 mg IV Cardizem push and Lopressor 25 mg p.o. x1 with heart rate still sustaining in the 160s to 170s.  Transfer patient to stepdown unit.  Placed on Cardizem drip.  Continue Lopressor 25 mg twice daily.  Follow.  Patient deemed not a anticoagulation candidate secondary to history of GI bleed.  Continue aspirin.  If heart rate is not controlled on Cardizem drip will need to consult with cardiology for further evaluation and management.  2.  Nausea and vomiting Patient had presented intractable nausea and vomiting felt secondary to an acute gastroenteritis as symptoms occurred after eating a [place] sandwich.  Patient denies any further nausea or emesis this morning and tolerating clear liquids.  Patient also being treated for UTI with IV Rocephin which we will continue.  Continue IV antiemetics, IV Ativan as needed, clear liquids, supportive  care.  If tolerating clear liquids could likely advance to a full liquid diet this evening.  Follow.  3.  UTI Urinalysis consistent with a UTI.  Urine cultures pending.  Patient noted to have a leukocytosis.  Continue IV Rocephin.  Follow.  4.  Tobacco abuse Tobacco cessation.  Nicotine patch.  5.  Chronic diastolic heart failure Compensated.  Continue home regimen of Lasix, Lipitor.  Patient on Lopressor 25 twice daily.  Follow.  6.  Gastroesophageal reflux disease PPI.  7.  Depression/anxiety Stable.  Continue Celexa and Xanax.  8 anemia Patient with no overt bleeding.  Likely dilutional.  Check an anemia panel.  Follow H&H.  Transfusion threshold hemoglobin less than 7.   DVT prophylaxis: Lovenox Code Status: Full Family Communication: Updated patient.  No family at bedside. Disposition Plan: Transfer to stepdown unit.   Consultants:   None  Procedures:  Chest x-ray pending 07/02/2018  Antimicrobials:   IV Rocephin 07/01/2018   Subjective: Was called by rapid response nurse as RN was trying to reach me as patient noted to go in A. fib with RVR with heart rates maintaining in the 170s.  Patient noted to be asymptomatic.  Assess patient patient on the telephone in her room speaking to her family.  Patient denies any chest pain.  Patient denies any significant shortness of breath.  Patient denies any further nausea or emesis.  Patient tolerating clear liquids.  Objective: Vitals:   07/02/18 0430 07/02/18 0554 07/02/18 0840 07/02/18 0942  BP: (!) 116/57 (!) 143/77  122/78  Pulse:  75 (!) 152 (!) 170  Resp: Temp:  99.5 F (37.5 C)  99.8 F (37.7 C)  TempSrc:  Oral  Oral  SpO2: 99% 93%  96%  Weight:      Height:        Intake/Output Summary (Last 24 hours) at 07/02/2018 1030 Last data filed at 07/02/2018 0840 Gross per 24 hour  Intake 2360.02 ml  Output -  Net 2360.02 ml   Filed Weights   07/01/18 1440  Weight: 89.8 kg    Examination:  General  exam: Appears calm and comfortable  Respiratory system: Some decreased breath sounds in the bases.  Fair air movement.  No wheezing.  No crackles noted.  Speaking in full sentences.  Respiratory effort normal. Cardiovascular system: Irregularly irregular.  No JVD, no murmurs, no rubs, no gallops.  No lower extremity edema.  Gastrointestinal system: Abdomen is nondistended, soft and nontender. No organomegaly or masses felt. Normal bowel sounds heard. Central nervous system: Alert and oriented. No focal neurological deficits. Extremities: Symmetric 5 x 5 power. Skin: No rashes, lesions or ulcers Psychiatry: Judgement and insight appear normal. Mood & affect appropriate.     Data Reviewed: I have personally reviewed following labs and imaging studies  CBC: Recent Labs  Lab 07/01/18 1558 07/02/18 0544  WBC 22.2* 12.3*  NEUTROABS 19.5*  --   HGB 12.1 9.6*  HCT 39.1 32.8*  MCV 93.8 95.9  PLT 225 158   Basic Metabolic Panel: Recent Labs  Lab 07/01/18 1558 07/02/18 0544  NA 140  140  K 3.7 4.1  CL 107 111  CO2 20* 22  GLUCOSE 184* 126*  BUN 23 22  CREATININE 1.24* 1.08*  CALCIUM 10.0 9.0  MG 2.0  --    GFR: Estimated Creatinine Clearance: 48.1 mL/min (A) (by C-G formula based on SCr of 1.08 mg/dL (H)). Liver Function Tests: Recent Labs  Lab 07/01/18 1558 07/02/18 0544  AST 28 18  ALT 8 6  ALKPHOS 89 66  BILITOT 0.5 0.4  PROT 7.9 6.4*  ALBUMIN 4.2 3.4*   No results for input(s): LIPASE, AMYLASE in the last 168 hours. No results for input(s): AMMONIA in the last 168 hours. Coagulation Profile: No results for input(s): INR, PROTIME in the last 168 hours. Cardiac Enzymes: No results for input(s): CKTOTAL, CKMB, CKMBINDEX, TROPONINI in the last 168 hours. BNP (last 3 results) No results for input(s): PROBNP in the last 8760 hours. HbA1C: No results for input(s): HGBA1C in the last 72 hours. CBG: No results for input(s): GLUCAP in the last 168 hours. Lipid  Profile: No results for input(s): CHOL, HDL, LDLCALC, TRIG, CHOLHDL, LDLDIRECT in the last 72 hours. Thyroid Function Tests: No results for input(s): TSH, T4TOTAL, FREET4, T3FREE, THYROIDAB in the last 72 hours. Anemia Panel: Recent Labs    07/02/18 0808 07/02/18 0830  VITAMINB12 218  --   FOLATE  --  6.0  FERRITIN 20  --   TIBC 402  --   IRON 18*  --    Sepsis Labs: No results for input(s): PROCALCITON, LATICACIDVEN in the last 168 hours.  Recent Results (from the past 240 hour(s))  SARS Coronavirus 2 (CEPHEID - Performed in [facility] hospital lab), Hosp Order     Status: None   Collection Time: 07/01/18 10:00 PM  Result Value Ref Range Status   SARS Coronavirus 2 NEGATIVE NEGATIVE Final    Comment: (NOTE) If result is NEGATIVE SARS-CoV-2 target nucleic acids are NOT DETECTED. The SARS-CoV-2 RNA is generally detectable in upper and lower  respiratory specimens during the acute phase of infection. The lowest  concentration of SARS-CoV-2 viral copies this assay can detect is 250  copies / mL. A negative result does not preclude SARS-CoV-2 infection  and should not be used as the sole basis for treatment or other  patient management decisions.  A negative result may occur with  improper specimen collection / handling, submission of specimen other  than nasopharyngeal swab, presence of viral mutation(s) within the  areas targeted by this assay, and inadequate number of viral copies  (<250 copies / mL). A negative result must be combined with clinical  observations, patient history, and epidemiological information. If result is POSITIVE SARS-CoV-2 target nucleic acids are DETECTED. The SARS-CoV-2 RNA is generally detectable in upper and lower  respiratory specimens dur ing the acute phase of infection.  Positive  results are indicative of active infection with SARS-CoV-2.  Clinical  correlation with patient history and other diagnostic information is  necessary to determine  patient infection status.  Positive results do  not rule out bacterial infection or co-infection with other viruses. If result is PRESUMPTIVE POSTIVE SARS-CoV-2 nucleic acids MAY BE PRESENT.   A presumptive positive result was obtained on the submitted specimen  and confirmed on repeat testing.  While 2019 novel coronavirus  (SARS-CoV-2) nucleic acids may be present in the submitted sample  additional confirmatory testing may be necessary for epidemiological  and / or clinical management purposes  to differentiate between  SARS-CoV-2 and other Sarbecovirus  currently known to infect humans.  If clinically indicated additional testing with an alternate test  methodology [phone]([id number]) is advised. The SARS-CoV-2 RNA is generally  detectable in upper and lower respiratory sp ecimens during the acute  phase of infection. The expected result is Negative. Fact Sheet for Patients:  BoilerBrush.com.cyhttps://www.fda.gov/media/136312/download Fact Sheet for Healthcare Providers: https://pope.com/https://www.fda.gov/media/136313/download This test is not yet approved or cleared by the [place] FDA and has been authorized for detection and/or diagnosis of SARS-CoV-2 by FDA under an Emergency Use Authorization (EUA).  This EUA will remain in effect (meaning this test can be used) for the duration of the COVID-19 declaration under Section 564(b)(1) of the Act, 21 U.S.C. section 360bbb-3(b)(1), unless the authorization is terminated or revoked sooner. Performed at [facility], [address] [place], [place] [address]          Radiology Studies: No results found.      Scheduled Meds: . acetaminophen  650 mg Oral BID  . ALPRAZolam  1 mg Oral QHS  . atorvastatin  20 mg Oral q1800  . citalopram  20 mg Oral Daily  . docusate sodium  100 mg Oral BID  . enoxaparin (LOVENOX) injection  40 mg Subcutaneous Daily  . fluticasone  2 spray Each Nare Daily  . furosemide  20 mg Oral Daily  . metoprolol  tartrate  25 mg Oral BID  . mometasone-formoterol  2 puff Inhalation BID  . nicotine  21 mg Transdermal Daily  . pantoprazole  40 mg Oral QAC breakfast  . potassium chloride SA  20 mEq Oral Daily   Continuous Infusions: . cefTRIAXone (ROCEPHIN)  IV    . dextrose 5 % and 0.45% NaCl 50 mL/hr at 07/02/18 0500     LOS: 0 days    Time spent: 40 minutes    Ramiro Harvestaniel Lizzette Carbonell, MD [facility]  If 7PM-7AM, please contact night-coverage [email or web address] 07/02/2018, 10:30 AM

## 2018-07-02 NOTE — Progress Notes (Signed)
CRITICAL VALUE ALERT  Critical Value: troponin 0.07  Date & Time Notied:  1341  Provider Notified: Thompson  Orders Received/Actions taken: None received, will continue to monitor

## 2018-07-02 NOTE — Progress Notes (Signed)
Patient transferred to stepdown.

## 2018-07-02 NOTE — Significant Event (Signed)
Rapid Response Event Note  Overview: Time Called: 1010 Arrival Time: 1015 Event Type: Cardiac  Initial Focused Assessment:  Rapid response called due to onset of afib RVRPateint lying in bed complaints of feeling overall ill but asymptomatic otherwise. Upon placing patient on the monitor, heart rate noted to be in apparent Afib RVR with a rate of 140-170.   Interventions:   Physician ordered 10 mg Cardizem IV push, medication administered. Heart rate and blood pressure monitored. Remained with patietn for duration of medication administration and monitoring period.    Plan of Care (if not transferred):  If heart rate stable after taking PO Metoprolol, and Cardizem IV push patient to remain on telemetry. If unable to get heart rate controlled plan to transfer to stepdown and initiate a Cardizem drip. Keep patient on clear liquids due to nausea and vomiting.   Event Summary: Name of Physician Notified: Ramiro Harvest MD at 1010   Outcome: Stayed in room and stabalized  Event End Time: 1100  Gabrielle [name] A Gabrielle [name]

## 2018-07-03 DIAGNOSIS — E782 Mixed hyperlipidemia: Secondary | ICD-10-CM

## 2018-07-03 DIAGNOSIS — R300 Dysuria: Secondary | ICD-10-CM

## 2018-07-03 LAB — GLUCOSE, CAPILLARY
Glucose-Capillary: 153 mg/dL — ABNORMAL HIGH (ref 70–99)
Glucose-Capillary: 133 mg/dL — ABNORMAL HIGH (ref 70–99)

## 2018-07-03 LAB — CBC
HCT: 32.8 % — ABNORMAL LOW (ref 36.0–46.0)
HCT: 36.2 % (ref 36.0–46.0)
Hemoglobin: 9.6 g/dL — ABNORMAL LOW (ref 12.0–15.0)
Hemoglobin: 10.6 g/dL — ABNORMAL LOW (ref 12.0–15.0)
MCH: 28.1 pg (ref 26.0–34.0)
MCH: 28.9 pg (ref 26.0–34.0)
MCHC: 29.3 g/dL — ABNORMAL LOW (ref 30.0–36.0)
MCHC: 29.3 g/dL — ABNORMAL LOW (ref 30.0–36.0)
MCV: 95.9 fL (ref 80.0–100.0)
MCV: 98.6 fL (ref 80.0–100.0)
Platelets: 158 10*3/uL (ref 150–400)
Platelets: 160 10*3/uL (ref 150–400)
RBC: 3.42 MIL/uL — ABNORMAL LOW (ref 3.87–5.11)
RBC: 3.67 MIL/uL — ABNORMAL LOW (ref 3.87–5.11)
RDW: 17 % — ABNORMAL HIGH (ref 11.5–15.5)
RDW: 17 % — ABNORMAL HIGH (ref 11.5–15.5)
WBC: 12.3 10*3/uL — ABNORMAL HIGH (ref 4.0–10.5)
WBC: 9.9 10*3/uL (ref 4.0–10.5)
nRBC: 0 % (ref 0.0–0.2)
nRBC: 0 % (ref 0.0–0.2)

## 2018-07-03 LAB — BASIC METABOLIC PANEL
Anion gap: 7 (ref 5–15)
BUN: 14 mg/dL (ref 8–23)
CO2: 21 mmol/L — ABNORMAL LOW (ref 22–32)
Calcium: 9 mg/dL (ref 8.9–10.3)
Chloride: 110 mmol/L (ref 98–111)
Creatinine, Ser: 0.97 mg/dL (ref 0.44–1.00)
GFR calc Af Amer: >60 mL/min (ref >60)
GFR calc non Af Amer: 58 mL/min — ABNORMAL LOW (ref >60)
Glucose, Bld: 119 mg/dL — ABNORMAL HIGH (ref 70–99)
Potassium: 4.2 mmol/L (ref 3.5–5.1)
Sodium: 138 mmol/L (ref 135–145)

## 2018-07-03 LAB — URINE CULTURE

## 2018-07-03 LAB — MRSA PCR SCREENING: MRSA by PCR: NEGATIVE

## 2018-07-03 LAB — MAGNESIUM: Magnesium: 1.9 mg/dL (ref 1.7–2.4)

## 2018-07-03 LAB — BRAIN NATRIURETIC PEPTIDE: B Natriuretic Peptide: 893.8 pg/mL — ABNORMAL HIGH (ref 0.0–100.0)

## 2018-07-03 MED ORDER — CEFDINIR 300 MG PO CAPS
300.0000 mg | ORAL_CAPSULE | Freq: Two times a day (BID) | ORAL | Status: DC
  Administered 2018-07-03 – 2018-07-06 (×6): 300 mg via ORAL
  Filled 2018-07-03 (×6): qty 1

## 2018-07-03 MED ORDER — HYDRALAZINE HCL 20 MG/ML IJ SOLN
5.0000 mg | Freq: Four times a day (QID) | INTRAMUSCULAR | Status: DC | PRN
  Administered 2018-07-03 – 2018-07-04 (×2): 5 mg via INTRAVENOUS
  Filled 2018-07-03 (×2): qty 1

## 2018-07-03 MED ORDER — METOPROLOL TARTRATE 25 MG PO TABS
37.5000 mg | ORAL_TABLET | Freq: Two times a day (BID) | ORAL | Status: DC
  Administered 2018-07-03: 37.5 mg via ORAL
  Filled 2018-07-03: qty 1

## 2018-07-03 MED ORDER — ESTRADIOL 0.1 MG/GM VA CREA
1.0000 | TOPICAL_CREAM | VAGINAL | Status: DC
  Filled 2018-07-03: qty 42.5

## 2018-07-03 MED ORDER — METRONIDAZOLE 0.75 % VA GEL
1.0000 | Freq: Every day | VAGINAL | Status: DC
  Filled 2018-07-03: qty 70

## 2018-07-03 MED ORDER — ZINC OXIDE 12.8 % EX OINT
TOPICAL_OINTMENT | CUTANEOUS | Status: DC | PRN
  Administered 2018-07-03: 1 via TOPICAL
  Administered 2018-07-03: 17:00:00 via TOPICAL
  Filled 2018-07-03: qty 56.7

## 2018-07-03 MED ORDER — CHLORHEXIDINE GLUCONATE CLOTH 2 % EX PADS
6.0000 | MEDICATED_PAD | Freq: Every day | CUTANEOUS | Status: DC
  Administered 2018-07-03 – 2018-07-04 (×2): 6 via TOPICAL

## 2018-07-03 NOTE — Progress Notes (Addendum)
PROGRESS NOTE    Gabrielle [name]  YIR:[id number]  DOB: December 21, 1945  DOA: 07/01/2018 PCP: Henrine Screws, MD  Brief Narrative:  73 y.o.femalewith medical history significant ofparoxysmal atrial fibrillation, asthma, COPD, depression, coronary artery disease, hypertension, hyperlipidemia, diastolic dysfunction CHF admitted to this medical center on 5/1 for evaluation and management of  Gastroenteritis like symptoms with n/v/d after eating a [place] sandwich. Patient also reported cough shortness of breath in addition to the vomiting. Patient was seen to be bradycardic initially on arrival but has had history of tachybradycardia syndrome. She was saturating 93% on RA. Labs showed WBC 22K and creatinine at 1.24. Urinalysis showed large leukocytes WBC more than 50. COVID-19 testing negative.Patient was admitted placed on IV fluids/symptomatic care for Gastroenteritis and started on empiric abx for UTI.  The morning of 07/02/2018 patient had A. fib with RVR with heart rate sustaining in the 170s.  Patient given a 10 mg IV push of Cardizem however heart rate remained still elevated in addition to Lopressor 25 mg x 1.  Patient subsequently transferred to the stepdown unit and placed on a Cardizem drip.  Subjective:  Patient converted to sinus rhythm and was having bradycardia on tele due to which Cardizem drip was turned off around 2 AM overnight. She is currently in sinus rhythm with PACs and HR 80 -110. BP elevated at 140/110. She is requiring 2-3 lits O2 [place] to keep sats >92%. She is c/o burning around her genitalia and asking if her urine test showed any infection.  Objective: Vitals:   07/03/18 0900 07/03/18 1125 07/03/18 1200 07/03/18 1300  BP: (!) 165/82 (!) 146/65 123/79 (!) 143/90  Pulse:      Resp: 20 (!) 22 (!) 23 17  Temp:      TempSrc:      SpO2: 99% 97% 97% 98%  Weight:      Height:        Intake/Output Summary (Last 24 hours) at 07/03/2018 1436 Last data filed at 07/03/2018  1335 Gross per 24 hour  Intake 2009.18 ml  Output 2700 ml  Net -690.82 ml   Filed Weights   07/01/18 1440  Weight: 89.8 kg    Physical Examination:  General exam: Appears calm and comfortable  Respiratory system: Clear to auscultation. Respiratory effort normal. Cardiovascular system: S1 & S2 heard,irregular with premature beats.No JVD, murmurs, rubs, gallops or clicks.  Gastrointestinal system: Abdomen is nondistended, soft and nontender. No organomegaly or masses felt. Normal bowel sounds heard. Central nervous system: Alert and oriented. No focal neurological deficits. Extremities: trace pitting edema. Symmetric 5 x 5 power. Skin: No rashes, lesions or ulcers Psychiatry: Judgement and insight appear normal. Mood & affect appropriate.     Data Reviewed: I have personally reviewed following labs and imaging studies  CBC: Recent Labs  Lab 07/01/18 1558 07/02/18 0544 07/03/18 0324  WBC 22.2* 12.3* 9.9  NEUTROABS 19.5*  --   --   HGB 12.1 9.6* 10.6*  HCT 39.1 32.8* 36.2  MCV 93.8 95.9 98.6  PLT 225 158 160   Basic Metabolic Panel: Recent Labs  Lab 07/01/18 1558 07/02/18 0544 07/03/18 0324  NA 140 140 138  K 3.7 4.1 4.2  CL 107 111 110  CO2 20* 22 21*  GLUCOSE 184* 126* 119*  BUN 23 22 14   CREATININE 1.24* 1.08* 0.97  CALCIUM 10.0 9.0 9.0  MG 2.0 2.1 1.9   GFR: Estimated Creatinine Clearance: 53.6 mL/min (by C-G formula based on SCr of 0.97 mg/dL). Liver  Function Tests: Recent Labs  Lab 07/01/18 1558 07/02/18 0544  AST 28 18  ALT 8 6  ALKPHOS 89 66  BILITOT 0.5 0.4  PROT 7.9 6.4*  ALBUMIN 4.2 3.4*   No results for input(s): LIPASE, AMYLASE in the last 168 hours. No results for input(s): AMMONIA in the last 168 hours. Coagulation Profile: No results for input(s): INR, PROTIME in the last 168 hours. Cardiac Enzymes: Recent Labs  Lab 07/02/18 1144 07/02/18 1657 07/02/18 2244  TROPONINI 0.07* 0.05* 0.04*   BNP (last 3 results) No results for  input(s): PROBNP in the last 8760 hours. HbA1C: No results for input(s): HGBA1C in the last 72 hours. CBG: Recent Labs  Lab 07/02/18 1701 07/02/18 2144  GLUCAP 153* 133*   Lipid Profile: No results for input(s): CHOL, HDL, LDLCALC, TRIG, CHOLHDL, LDLDIRECT in the last 72 hours. Thyroid Function Tests: Recent Labs    07/02/18 0808  TSH 0.475   Anemia Panel: Recent Labs    07/02/18 0808 07/02/18 0830  VITAMINB12 218  --   FOLATE  --  6.0  FERRITIN 20  --   TIBC 402  --   IRON 18*  --    Sepsis Labs: No results for input(s): PROCALCITON, LATICACIDVEN in the last 168 hours.  Recent Results (from the past 240 hour(s))  Urine culture     Status: None   Collection Time: 07/01/18 10:00 PM  Result Value Ref Range Status   Specimen Description   Final    URINE, CLEAN CATCH Performed at [facility], [address] [place], [place] [address]    Special Requests   Final    NONE Performed at [facility], [address] [place], [place] [address]    Culture   Final    Multiple bacterial morphotypes present, none predominant. Suggest appropriate recollection if clinically indicated.   Report Status 07/03/2018 FINAL  Final  SARS Coronavirus 2 (CEPHEID - Performed in [facility] hospital lab), Hosp Order     Status: None   Collection Time: 07/01/18 10:00 PM  Result Value Ref Range Status   SARS Coronavirus 2 NEGATIVE NEGATIVE Final    Comment: (NOTE) If result is NEGATIVE SARS-CoV-2 target nucleic acids are NOT DETECTED. The SARS-CoV-2 RNA is generally detectable in upper and lower  respiratory specimens during the acute phase of infection. The lowest  concentration of SARS-CoV-2 viral copies this assay can detect is 250  copies / mL. A negative result does not preclude SARS-CoV-2 infection  and should not be used as the sole basis for treatment or other  patient management decisions.  A negative result may occur with  improper  specimen collection / handling, submission of specimen other  than nasopharyngeal swab, presence of viral mutation(s) within the  areas targeted by this assay, and inadequate number of viral copies  (<250 copies / mL). A negative result must be combined with clinical  observations, patient history, and epidemiological information. If result is POSITIVE SARS-CoV-2 target nucleic acids are DETECTED. The SARS-CoV-2 RNA is generally detectable in upper and lower  respiratory specimens dur ing the acute phase of infection.  Positive  results are indicative of active infection with SARS-CoV-2.  Clinical  correlation with patient history and other diagnostic information is  necessary to determine patient infection status.  Positive results do  not rule out bacterial infection or co-infection with other viruses. If result is PRESUMPTIVE POSTIVE SARS-CoV-2 nucleic acids MAY BE PRESENT.   A presumptive positive  result was obtained on the submitted specimen  and confirmed on repeat testing.  While 2019 novel coronavirus  (SARS-CoV-2) nucleic acids may be present in the submitted sample  additional confirmatory testing may be necessary for epidemiological  and / or clinical management purposes  to differentiate between  SARS-CoV-2 and other Sarbecovirus currently known to infect humans.  If clinically indicated additional testing with an alternate test  methodology [phone]([id number]) is advised. The SARS-CoV-2 RNA is generally  detectable in upper and lower respiratory sp ecimens during the acute  phase of infection. The expected result is Negative. Fact Sheet for Patients:  BoilerBrush.com.cyhttps://www.fda.gov/media/136312/download Fact Sheet for Healthcare Providers: https://pope.com/https://www.fda.gov/media/136313/download This test is not yet approved or cleared by the [place] FDA and has been authorized for detection and/or diagnosis of SARS-CoV-2 by FDA under an Emergency Use Authorization (EUA).  This EUA will remain in  effect (meaning this test can be used) for the duration of the COVID-19 declaration under Section 564(b)(1) of the Act, 21 U.S.C. section 360bbb-3(b)(1), unless the authorization is terminated or revoked sooner. Performed at [facility], [address] [place], [place] [address]       Radiology Studies: Dg Chest Port 1 View  Result Date: 07/02/2018 CLINICAL DATA:  Dyspnea EXAM: PORTABLE CHEST 1 VIEW COMPARISON:  08/19/2016 chest radiograph. FINDINGS: Stable configuration of intact sternotomy wires and 2 lead left subclavian pacemaker. Stable cardiomediastinal silhouette with mild cardiomegaly. No pneumothorax. No pleural effusion. No pulmonary edema. Stable mild scarring versus atelectasis in the left mid lung. No acute consolidative airspace disease. IMPRESSION: Stable mild cardiomegaly without overt pulmonary edema. No active pulmonary disease. Electronically Signed   By: Delbert PhenixJason A Poff M.D.   On: 07/02/2018 11:07        Scheduled Meds: . ALPRAZolam  1 mg Oral QHS  . atorvastatin  20 mg Oral q1800  . Chlorhexidine Gluconate Cloth  6 each Topical Q0600  . citalopram  20 mg Oral Daily  . docusate sodium  100 mg Oral BID  . enoxaparin (LOVENOX) injection  40 mg Subcutaneous Daily  . fluticasone  2 spray Each Nare Daily  . furosemide  20 mg Oral Daily  . metoprolol tartrate  25 mg Oral BID  . mometasone-formoterol  2 puff Inhalation BID  . nicotine  21 mg Transdermal Daily  . pantoprazole  40 mg Oral QAC breakfast  . potassium chloride SA  20 mEq Oral Daily   Continuous Infusions: . cefTRIAXone (ROCEPHIN)  IV Stopped (07/02/18 2217)  . dextrose 5 % and 0.45% NaCl 50 mL/hr at 07/03/18 1335  . diltiazem (CARDIZEM) infusion Stopped (07/03/18 0200)    Assessment & Plan:    1.  Gastroenteritis: could have been related to food poisoning. Improved now.   2. Afib with RVR: Now converted to sinus rhythm. Continue metoprolol. Will increase to 37.5 mg BID and watch  for bradycardia/tachycardia. Can use IV metoprolol or cardizem PRN for possible recurrence of RVR. Patient not a candidate for anticoagulation due to GI bleed history. Continue aspirin.  3.  Dysuria: On IV rocephin Day 2 for UTI . U/A showed significant pyuria,  but rare bacteria with epithelial cells/RBCs. Urine cx  Non specific with Mixed flora. Change abx to oral to avoid fluid overload.?atrophic vaginitis ?Bacterial vaginosis. Will try vaginal cream and monitor symptoms  4. Acute hypoxic respiratory failure: sec to CHF vs underlying COPD/ possible chronic o2 needs. Patient also reports insomnia and day time sleeping. Never had sleep study but does have body  habitus conducive to sleep apnea. D/C IVF. Continue lasix and neb treatments. Will need o2 desat studies prior to d/c. COVID -ve   5.  Chronic diastolic CHF: Not sure if she may have acute component in the setting of #2. Patient has trace leg edema but CXR 5/2 -ve pulm edema. Check BNP. D/C IV fluids. Lasix x1 and titrate O2 to off as tolerated. Last echo in 2018 showed grade 3 diastolic dysfunction.   6. Hyperlipidemia: Statins  7. Tobacco dependence/ COPD: Nicotine patch . Nebs prn. Denies undergoing o2 desat studies in the past but apparently diagnosed with COPD.  8.  Depression/anxiety: Stable.  Continue Celexa and Xanax.  9. Normocytic anemia: Likely dilutional. Improved to 10.6 today. No signs of bleeding.   DVT prophylaxis: Lovenox Code Status: Full code Family / Patient Communication: d/w patient. All qs answered Disposition Plan: Home, likely soon  Can be transferred to medical telemetry if no further paroxysmal A.fib/RVR episodes .   LOS: 1 day    Time spent: 35 minutes    Alessandra Bevels, MD [facility] Pager [phone]  If 7PM-7AM, please contact night-coverage [email or web address] Password [email or web address] 07/03/2018, 2:36 PM

## 2018-07-03 NOTE — Progress Notes (Signed)
RN notified MD that patient HR was dropping between the 50's and 60's. Pt. Was also having multiple pauses. RN turned off Cardizem drip to prevent patient HR from dropping lower. Pt. Had been converted from Afib to NSR at approximately 2000. Cardizem drip was turned off at 0200. No new orders at this time. RN will continue to monitor pt. Closely.

## 2018-07-04 DIAGNOSIS — I5033 Acute on chronic diastolic (congestive) heart failure: Secondary | ICD-10-CM

## 2018-07-04 LAB — CBC WITH DIFFERENTIAL/PLATELET
Abs Immature Granulocytes: 0.05 10*3/uL (ref 0.00–0.07)
Basophils Absolute: 0.1 10*3/uL (ref 0.0–0.1)
Basophils Relative: 1 %
Eosinophils Absolute: 0.1 10*3/uL (ref 0.0–0.5)
Eosinophils Relative: 1 %
HCT: 39.1 % (ref 36.0–46.0)
Hemoglobin: 11.4 g/dL — ABNORMAL LOW (ref 12.0–15.0)
Immature Granulocytes: 1 %
Lymphocytes Relative: 21 %
Lymphs Abs: 1.8 10*3/uL (ref 0.7–4.0)
MCH: 28.9 pg (ref 26.0–34.0)
MCHC: 29.2 g/dL — ABNORMAL LOW (ref 30.0–36.0)
MCV: 99 fL (ref 80.0–100.0)
Monocytes Absolute: 0.6 10*3/uL (ref 0.1–1.0)
Monocytes Relative: 7 %
Neutro Abs: 6 10*3/uL (ref 1.7–7.7)
Neutrophils Relative %: 69 %
Platelets: 182 10*3/uL (ref 150–400)
RBC: 3.95 MIL/uL (ref 3.87–5.11)
RDW: 16.7 % — ABNORMAL HIGH (ref 11.5–15.5)
WBC: 8.5 10*3/uL (ref 4.0–10.5)
nRBC: 0 % (ref 0.0–0.2)

## 2018-07-04 LAB — BASIC METABOLIC PANEL
Anion gap: 6 (ref 5–15)
BUN: 12 mg/dL (ref 8–23)
CO2: 27 mmol/L (ref 22–32)
Calcium: 9.4 mg/dL (ref 8.9–10.3)
Chloride: 105 mmol/L (ref 98–111)
Creatinine, Ser: 0.97 mg/dL (ref 0.44–1.00)
GFR calc Af Amer: >60 mL/min (ref >60)
GFR calc non Af Amer: 58 mL/min — ABNORMAL LOW (ref >60)
Glucose, Bld: 204 mg/dL — ABNORMAL HIGH (ref 70–99)
Potassium: 3.5 mmol/L (ref 3.5–5.1)
Sodium: 138 mmol/L (ref 135–145)

## 2018-07-04 LAB — BRAIN NATRIURETIC PEPTIDE: B Natriuretic Peptide: 1210.2 pg/mL — ABNORMAL HIGH (ref 0.0–100.0)

## 2018-07-04 LAB — MAGNESIUM: Magnesium: 1.8 mg/dL (ref 1.7–2.4)

## 2018-07-04 MED ORDER — DILTIAZEM HCL 100 MG IV SOLR
5.0000 mg/h | INTRAVENOUS | Status: DC
  Administered 2018-07-04: 15 mg/h via INTRAVENOUS
  Administered 2018-07-04: 5 mg/h via INTRAVENOUS
  Administered 2018-07-05 (×2): 15 mg/h via INTRAVENOUS
  Filled 2018-07-04 (×3): qty 100

## 2018-07-04 MED ORDER — METOPROLOL TARTRATE 50 MG PO TABS
50.0000 mg | ORAL_TABLET | Freq: Two times a day (BID) | ORAL | Status: DC
  Administered 2018-07-04 – 2018-07-06 (×5): 50 mg via ORAL
  Filled 2018-07-04 (×2): qty 1
  Filled 2018-07-04 (×3): qty 2

## 2018-07-04 MED ORDER — CHLORHEXIDINE GLUCONATE CLOTH 2 % EX PADS
6.0000 | MEDICATED_PAD | Freq: Every day | CUTANEOUS | Status: DC
  Administered 2018-07-05: 6 via TOPICAL

## 2018-07-04 MED ORDER — FUROSEMIDE 10 MG/ML IJ SOLN
20.0000 mg | Freq: Two times a day (BID) | INTRAMUSCULAR | Status: DC
  Administered 2018-07-04 – 2018-07-06 (×5): 20 mg via INTRAVENOUS
  Filled 2018-07-04 (×5): qty 2

## 2018-07-04 NOTE — Consult Note (Addendum)
Cardiology Consultation:   Patient ID: Gabrielle [name] MRN: [id number]; DOB: [birth year]  Admit date: 07/01/2018 Date of Consult: 07/04/2018  Primary Care Provider: Henrine Screws, MD Primary Cardiologist: Donato Schultz, MD  Primary Electrophysiologist:  Dr. Ladona Ridgel   Patient Profile:   Gabrielle [name] is a 73 y.o. 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 Ladona Ridgel), COPD, anxiety/depression, tobacco abuse and obesity who is being seen today for the evaluation of Afib RVR at the request of Dr. Janee Morn.   She was hospitalized multiple times in 2017 with significant lower GI bleed, bright red blood, hematochezia with hemoglobin of 4 requiring a total of 6 units of packed red blood cells, 4 during initial admission and 2 during a repeat admission after she had hypotension and more blood loss. Dr. Randa Evens GI. Friable mucosa noted on colonoscopy. Presumed diverticular bleed. She has been off of her Eliquis since then.  Hx of QT prolongation on anti depression.   Patient had occasional palpitation during telehealth visit with Dr. Ladona Ridgel 06/02/18?? Advised to take additional metoprolol as needed.   History of Present Illness:   [name] admitted 5/1 with intractable nausea vomiting felt secondary to an acute gastroenteritis as symptoms occurred after eating a [place] sandwich. Treated with supportive care with improved symptoms. Treated with IV Rocephin for possible UTI. EKG on admission showed afib at rate of 121 bpm- personally reviewed. However, missed home metoprolol. Due to persistently elevated rate started on Cardizem gtt and metoprolol without improved rate. Cardiology is called for further evaluation and management.  Past Medical History:  Diagnosis Date  . Anemia   . Arthritis   . Asthma   . Atrial fibrillation with controlled  ventricular response (HCC) 11/2009   a. not on anticoagulation due to recurrent GI bleeding while on Eliquis.  . Chronic bronchitis   . Chronic diastolic CHF (congestive heart failure) (HCC)    a. Echo 12/2015: EF 55-60% w/ Grade 3 DD  . Chronic headache   . COPD (chronic obstructive pulmonary disease) (HCC)   . Depression   . Heart attack (HCC)   . Hyperlipidemia   . Hypertension     Past Surgical History:  Procedure Laterality Date  . CAD-CABG     x7, brief post-op Atrial Fib  . CESAREAN SECTION     x2  . COLONOSCOPY WITH PROPOFOL N/A 10/24/2015   Procedure: COLONOSCOPY WITH PROPOFOL;  Surgeon: Carman Ching, MD;  Location: [facility] ENDOSCOPY;  Service: Endoscopy;  Laterality: N/A;  . CORONARY ARTERY BYPASS GRAFT  2011  . EP IMPLANTABLE DEVICE N/A 12/26/2015   Procedure: Pacemaker Implant;  Surgeon: Marinus Maw, MD;  Location: [facility] INVASIVE CV LAB;  Service: Cardiovascular;  Laterality: N/A;  . ESOPHAGOGASTRODUODENOSCOPY N/A 10/22/2015   Procedure: ESOPHAGOGASTRODUODENOSCOPY (EGD);  Surgeon: Carman Ching, MD;  Location: [facility] ENDOSCOPY;  Service: Endoscopy;  Laterality: N/A;  . ESOPHAGOGASTRODUODENOSCOPY N/A 12/24/2015   Procedure: ESOPHAGOGASTRODUODENOSCOPY (EGD);  Surgeon: Carman Ching, MD;  Location: [facility] ENDOSCOPY;  Service: Endoscopy;  Laterality: N/A;  . JOINT REPLACEMENT Bilateral   . TONSILLECTOMY    . TOTAL KNEE ARTHROPLASTY        Inpatient Medications: Scheduled Meds: . ALPRAZolam  1 mg Oral QHS  . atorvastatin  20 mg Oral q1800  . cefdinir  300 mg Oral Q12H  . Chlorhexidine Gluconate Cloth  6 each Topical Q0600  .  citalopram  20 mg Oral Daily  . docusate sodium  100 mg Oral BID  . enoxaparin (LOVENOX) injection  40 mg Subcutaneous Daily  . fluticasone  2 spray Each Nare Daily  . furosemide  20 mg Intravenous Q12H  . metoprolol tartrate  50 mg Oral BID  . metroNIDAZOLE  1 Applicatorful Vaginal QHS  . mometasone-formoterol  2 puff Inhalation BID  . nicotine  21 mg  Transdermal Daily  . pantoprazole  40 mg Oral QAC breakfast  . potassium chloride SA  20 mEq Oral Daily   Continuous Infusions: . diltiazem (CARDIZEM) infusion     PRN Meds: acetaminophen, albuterol, hydrALAZINE, ondansetron **OR** ondansetron (ZOFRAN) IV, tiZANidine, Zinc Oxide  Allergies:    Allergies  Allergen Reactions  . Novocain [Procaine] Hives and Palpitations  . Penicillins Hives and Other (See Comments)    Has patient had a PCN reaction causing immediate rash, facial/tongue/throat swelling, SOB or lightheadedness with hypotension: No Has patient had a PCN reaction causing severe rash involving mucus membranes or skin necrosis: No Has patient had a PCN reaction that required hospitalization No Has patient had a PCN reaction occurring within the last 10 years: No If all of the above answers are "NO", then may proceed with Cephalosporin use.    Social History:   Social History   Socioeconomic History  . Marital status: Divorced    Spouse name: Not on file  . Number of children: Not on file  . Years of education: Not on file  . Highest education level: Not on file  Occupational History  . Occupation: retired Nutritional therapist: [employer]  Social Needs  . Financial resource strain: Not on file  . Food insecurity:    Worry: Not on file    Inability: Not on file  . Transportation needs:    Medical: Not on file    Non-medical: Not on file  Tobacco Use  . Smoking status: Current Some Day Smoker    Packs/day: 0.20    Types: Cigarettes  . Smokeless tobacco: Never Used  . Tobacco comment: "Occasionally I will smoke a cigarette"   Substance and Sexual Activity  . Alcohol use: No    Alcohol/week: 0.0 standard drinks  . Drug use: No  . Sexual activity: Not on file  Lifestyle  . Physical activity:    Days per week: Not on file    Minutes per session: Not on file  . Stress: Not on file  Relationships  . Social connections:    Talks on phone: Not on file     Gets together: Not on file    Attends religious service: Not on file    Active member of club or organization: Not on file    Attends meetings of clubs or organizations: Not on file    Relationship status: Not on file  . Intimate partner violence:    Fear of current or ex partner: Not on file    Emotionally abused: Not on file    Physically abused: Not on file    Forced sexual activity: Not on file  Other Topics Concern  . Not on file  Social History Narrative  . Not on file    Family History:    Family History  Problem Relation Age of Onset  . Diabetes Mother   . Hypertension Mother   . Heart disease Mother        before age 34  . Heart disease Father  before age 34  . Hypertension Father   . Heart attack Father   . Lung cancer Maternal Grandfather   . Lung cancer Paternal Grandfather   . Hypertension Daughter   . Stroke Paternal Grandmother      ROS:  Please see the history of present illness.  All other ROS reviewed and negative.     Physical Exam/Data:   Vitals:   07/04/18 0400 07/04/18 0600 07/04/18 0728 07/04/18 0800  BP: (!) 165/109 (!) 159/105  (!) 163/82  Pulse:      Resp: (!) 24 (!) 22  19  Temp:    97.7 F (36.5 C)  TempSrc:    Oral  SpO2: 99% 97% 98% 94%  Weight:      Height:        Intake/Output Summary (Last 24 hours) at 07/04/2018 1014 Last data filed at 07/04/2018 0800 Gross per 24 hour  Intake 639.17 ml  Output 1800 ml  Net -1160.83 ml   Last 3 Weights 07/01/2018 01/20/2018 10/27/2017  Weight (lbs) 198 lb 213 lb 12.8 oz 190 lb 1.9 oz  Weight (kg) 89.812 kg 96.979 kg 86.238 kg     Body mass index is 35.64 kg/m.  General:  Well nourished, well developed, in no acute distress, somnolent HEENT: normal Lymph: no adenopathy Neck: no JVD Endocrine:  No thryomegaly Vascular: No carotid bruits; FA pulses 2+ bilaterally without bruits  Cardiac:  normal S1, S2; iRRR; no murmur  Lungs: crackles to auscultation bilaterally, no wheezing,  rhonchi or rales  Abd: soft, nontender, no hepatomegaly  Ext: mild B/L edema Musculoskeletal:  No deformities, BUE and BLE strength normal and equal Skin: warm and dry  Neuro:  CNs 2-12 intact, no focal abnormalities noted Psych:  Normal affect   EKG:  The EKG was personally reviewed and demonstrates:  Afib at rate of 121 bpm Telemetry:  Telemetry was personally reviewed and demonstrates:  Afib with rapid ventricular rate  Relevant CV Studies:  Echo 04/2016 Study Conclusions  - Left ventricle: The cavity size was mildly dilated. Systolic   function was normal. The estimated ejection fraction was 55%.   There is akinesis of the apical myocardium. There is akinesis of   the basalinferior myocardium. There is akinesis of the   apicalanterior myocardium. There was a reduced contribution of   atrial contraction to ventricular filling, due to increased   ventricular diastolic pressure or atrial contractile dysfunction.   Doppler parameters are consistent with a reversible restrictive   pattern, indicative of decreased left ventricular diastolic   compliance and/or increased left atrial pressure (grade 3   diastolic dysfunction). Doppler parameters are consistent with   high ventricular filling pressure. - Aortic valve: There was mild regurgitation. - Mitral valve: Severely calcified annulus. There was moderate   regurgitation directed posteriorly. Mean gradient (D): 7 mm Hg. - Left atrium: The atrium was mildly dilated. - Right ventricle: Pacer wire or catheter noted in right ventricle. - Right atrium: The atrium was mildly dilated. - Tricuspid valve: There was moderate regurgitation. - Pulmonic valve: There was mild regurgitation. - Pulmonary arteries: PA peak pressure: 59 mm Hg (S).  Impressions:  - The right ventricular systolic pressure was increased consistent   with moderate pulmonary hypertension.  Laboratory Data:  Chemistry Recent Labs  Lab 07/01/18 1558  07/02/18 0544 07/03/18 0324  NA 140 140 138  K 3.7 4.1 4.2  CL 107 111 110  CO2 20* 22 21*  GLUCOSE 184* 126* 119*  BUN  23 22 14   CREATININE 1.24* 1.08* 0.97  CALCIUM 10.0 9.0 9.0  GFRNONAA 43* 51* 58*  GFRAA 50* 59* >60  ANIONGAP 13 7 7     Recent Labs  Lab 07/01/18 1558 07/02/18 0544  PROT 7.9 6.4*  ALBUMIN 4.2 3.4*  AST 28 18  ALT 8 6  ALKPHOS 89 66  BILITOT 0.5 0.4   Hematology Recent Labs  Lab 07/01/18 1558 07/02/18 0544 07/03/18 0324  WBC 22.2* 12.3* 9.9  RBC 4.17 3.42* 3.67*  HGB 12.1 9.6* 10.6*  HCT 39.1 32.8* 36.2  MCV 93.8 95.9 98.6  MCH 29.0 28.1 28.9  MCHC 30.9 29.3* 29.3*  RDW 16.9* 17.0* 17.0*  PLT 225 158 160   Cardiac Enzymes Recent Labs  Lab 07/02/18 1144 07/02/18 1657 07/02/18 2244  TROPONINI 0.07* 0.05* 0.04*   No results for input(s): TROPIPOC in the last 168 hours.  BNP Recent Labs  Lab 07/03/18 1635  BNP 893.8*    DDimer No results for input(s): DDIMER in the last 168 hours.  Radiology/Studies:  Dg Chest Port 1 View  Result Date: 07/02/2018 CLINICAL DATA:  Dyspnea EXAM: PORTABLE CHEST 1 VIEW COMPARISON:  08/19/2016 chest radiograph. FINDINGS: Stable configuration of intact sternotomy wires and 2 lead left subclavian pacemaker. Stable cardiomediastinal silhouette with mild cardiomegaly. No pneumothorax. No pleural effusion. No pulmonary edema. Stable mild scarring versus atelectasis in the left mid lung. No acute consolidative airspace disease. IMPRESSION: Stable mild cardiomegaly without overt pulmonary edema. No active pulmonary disease. Electronically Signed   By: Delbert PhenixJason A Poff M.D.   On: 07/02/2018 11:07    Assessment and Plan:   1. Afib with RVR - Likely triggered by gastroenteritis, anemia. However recently complained of intermittent palpitation. Less than 1% burden of afib on device check 05/09/2018.   - Rate elevated despite on cardizem gtt and metoprolol. I would increase cardizem to 10, and if tolerate to 15 mg/hr as she is  hypertensive, we will hold other BP meds - hydralazine - CHADSVASC score of 4. Not on chronic anticoagulation due to prior GI bleed.   2. Tachy-brady syndrome s/p PPM - Followed by Dr. Ladona Ridgelaylor. As above.  3. Acute on Chronic diastolic CHF - BNP ~900. On low dose IV lasix 20 mg BID, negative 1.1 L overinight, still fluid overloaded, I would continue - crea 0.97, K 4.2 - weight today 89 kg, baseline 86 kg  4. Elevated troponin - Flat trend (0.07>>0.05>>0.04). Likely demand in setting of elevated rate.  - no ischemic workup is necessary  5. HTN - BP intermittently elevated.   For questions or updates, please contact [facility] Please consult [email or web address] for contact info under   Signed, Tobias AlexanderKatarina Tamme Mozingo, MD 07/04/2018 10:14 AM

## 2018-07-04 NOTE — Progress Notes (Signed)
Hr 160s, patient back in afib. Dr Janee Morn notified. Will give Metoprolol 50mg  po.

## 2018-07-04 NOTE — Progress Notes (Signed)
PROGRESS NOTE    Gabrielle [name]  RUE:[id number] DOB: 1945/05/28 DOA: 07/01/2018 PCP: Henrine Screws, MD   Brief Narrative:  HPI per Dr. Aundria Mems Gabrielle [name] is a 73 y.o. female with medical history significant of paroxysmal atrial fibrillation, asthma, COPD, depression, coronary artery disease, hypertension, hyperlipidemia, diastolic dysfunction CHF who came to the ER today with persistent nausea vomiting.  Patient apparently ate some [place] sandwich and since then has been having persistent nausea with vomiting.  She was seen in the ER spent 7 hours getting treatment but no relief.  She was given him multiple doses of Zofran Ativan and Haldol.  Patient has not stopped vomiting and is therefore being admitted to the hospital for symptomatic management.  Patient also reported cough shortness of breath in addition to the vomiting.  She has had some diarrhea.  Which has stopped at the moment.  Denied any fever or chills.  Patient was seen to be bradycardic initially on arrival but has had history of tachybradycardia syndrome.  At this point she is being admitted to the hospital for symptomatic management..  ED Course: Initial temperature 98 for blood pressure 176/73 pulse 135 respiratory rate of 33 oxygen sat 93% room air.Sodium 140 potassium 3.7.  CO2 20 glucose 184.  Creatinine is 1.24.  Magnesium 2.0.  White count is 22.2 thousand.  The rest of the CBC appears to be within normal.  Urinalysis showed large leukocytes WBC more than 50.  COVID-19 testing is negative.  Patient being admitted for treatment of intractable nausea vomiting as well as UTI.  Patient was admitted placed on IV fluids and supportive care.  The morning of 07/02/2018 patient was noted to go into A. fib with RVR with heart rate sustaining in the 170s.  Patient given a 10 mg IV push of Cardizem however heart rate remained still elevated in addition to Lopressor 25 mg x 1.  Patient subsequently transferred to the stepdown unit  and placed on a Cardizem drip.   Assessment & Plan:   Principal Problem:   Atrial fibrillation with RVR (HCC) Active Problems:   Nausea and/or vomiting   Hyperlipidemia   NICOTINE ADDICTION   Depression   Atrial fibrillation (HCC)   Obesity   PAF (paroxysmal atrial fibrillation) (HCC)   Tachy-brady syndrome (HCC)   Chronic diastolic CHF (congestive heart failure) (HCC)   Acute lower UTI   Nausea and vomiting   1 A. fib with RVR Questionable etiology.  Patient did state was on Lopressor 50 mg nightly however patient did not receive Lopressor on admission.  Patient denies any chest pain or shortness of breath.  Check a chest x-ray.  Cardiac enzymes is minimally but trending down.  Patient denies any chest pain.  TSH is 0.475.Marland Kitchen  EKG consistent with A. fib with RVR.  Patient with 2D echo done on 05/17/2016 with a EF of 55%, akinesis of apical myocardium, basal inferior myocardium, apical anterior myocardium, grade 3 diastolic dysfunction, mild aortic valvular regurgitation, moderate mitral valvular regurgitation, right ventricular systolic pressure increased consistent with moderate pulmonary hypertension.  Patient was placed on a Cardizem drip and Lopressor adjusted to 37.5 twice daily on 07/03/2018.  Patient noted to be off Cardizem drip on 07/03/2018 and converted back to normal sinus rhythm.  This morning 07/04/2018 patient is back in A. fib with RVR with heart rate sustaining in the 140s.  Lopressor has been increased to 50 mg twice daily.  Will place patient back on a Cardizem drip. Patient  deemed not a anticoagulation candidate secondary to history of GI bleed.  Continue aspirin.  Due to difficulty controlling patient's heart rate and going in and out of A. fib will consult with cardiology for further evaluation and management.  2.  Nausea and vomiting/gastroenteritis Patient had presented intractable nausea and vomiting felt secondary to an acute gastroenteritis as symptoms occurred after  eating a [place] sandwich.  Patient denies any further nausea or emesis this morning and tolerating clear liquids.  Patient also being treated for UTI with IV Rocephin which we will continue.  Continue IV antiemetics, IV Ativan as needed, clear liquids, supportive care.  Patient currently on a soft diet and tolerating.  Continue supportive care.  Follow.    3.  UTI Urinalysis consistent with a UTI.  Urine cultures have a urinalysis with pyuria, rare bacteria with epithelial cells.  Currently on oral antibiotics.  Concern for possible atrophic vaginitis versus bacterial vaginosis.  Patient placed on the vaginal cream with some improvement with symptoms..  Continue Omnicef.   4.  Tobacco abuse Tobacco cessation.  Nicotine patch.  5.  Acute on chronic diastolic heart failure Patient with some complaints of shortness of breath today patient noted to have some bibasilar crackles as well as JVD.  Likely secondary to A. fib with RVR.  IV fluids have been discontinued.  Will DC oral Lasix and placed on Lasix 20 mg IV every 12 hours.  Strict I's and O's.  Daily weights.  Last 2D echo from 2018 with a EF of 55% and grade 3 diastolic dysfunction.  Continue current dose of Lopressor, Lipitor.  Consult with cardiology.   6.  Gastroesophageal reflux disease PPI.  7.  Depression/anxiety Stable.  Continue Celexa and Xanax.  8 anemia/iron deficiency anemia. Patient with no overt bleeding.  Likely dilutional.  Anemia panel with iron level of 18, folate of 6, ferritin of 20.  TIBC of 402.  Hemoglobin currently stable at 10.6.  Transfusion threshold hemoglobin less than 7.  Follow H&H.  Will need oral iron supplementation on discharge.    DVT prophylaxis: Lovenox Code Status: Full Family Communication: Updated patient.  No family at bedside. Disposition Plan: Remain in stepdown unit as patient back on Cardizem drip.    Consultants:   None  Procedures:  Chest x-ray  07/02/2018  Antimicrobials:   IV  Rocephin 07/01/2018>>>> 07/03/2018  Omnicef 07/03/2018   Subjective: Patient sleeping however easily arousable.  Cardizem drip noted to have been discontinued yesterday as patient had converted back into normal sinus rhythm.  Patient back in A. fib this morning with heart rates sustaining in the 160s.  Patient's metoprolol of 50 mg adjusted this morning and given however still in A. fib with heart rates in the 140s.  Patient denies any chest pain.  Patient with complaints of shortness of breath.  Patient states she just does not feel well.  No further nausea or emesis.  Dysuria improving.  Tolerating current diet.   Objective: Vitals:   07/04/18 0400 07/04/18 0600 07/04/18 0728 07/04/18 0800  BP: (!) 165/109 (!) 159/105  (!) 163/82  Pulse:      Resp: (!) 24 (!) 22  19  Temp:    97.7 F (36.5 C)  TempSrc:    Oral  SpO2: 99% 97% 98% 94%  Weight:      Height:        Intake/Output Summary (Last 24 hours) at 07/04/2018 0950 Last data filed at 07/04/2018 0800 Gross per 24 hour  Intake  639.17 ml  Output 2200 ml  Net -1560.83 ml   Filed Weights   07/01/18 1440  Weight: 89.8 kg    Examination:  General exam: Appears calm and comfortable  Respiratory system: Some bibasilar crackles.  No wheezing.  Fair air movement.  Speaking in full sentences.  Respiratory effort normal. Cardiovascular system: Irregularly irregular.  + JVD, no murmurs, no rubs, no gallops.  No lower extremity edema.  Gastrointestinal system: Abdomen is soft, nontender, nondistended, positive bowel sounds.  No rebound.  No guarding.  Central nervous system: Alert and oriented. No focal neurological deficits. Extremities: Symmetric 5 x 5 power. Skin: No rashes, lesions or ulcers Psychiatry: Judgement and insight appear normal. Mood & affect appropriate.     Data Reviewed: I have personally reviewed following labs and imaging studies  CBC: Recent Labs  Lab 07/01/18 1558 07/02/18 0544 07/03/18 0324  WBC 22.2* 12.3*  9.9  NEUTROABS 19.5*  --   --   HGB 12.1 9.6* 10.6*  HCT 39.1 32.8* 36.2  MCV 93.8 95.9 98.6  PLT 225 158 160   Basic Metabolic Panel: Recent Labs  Lab 07/01/18 1558 07/02/18 0544 07/03/18 0324  NA 140 140 138  K 3.7 4.1 4.2  CL 107 111 110  CO2 20* 22 21*  GLUCOSE 184* 126* 119*  BUN 23 22 14   CREATININE 1.24* 1.08* 0.97  CALCIUM 10.0 9.0 9.0  MG 2.0 2.1 1.9   GFR: Estimated Creatinine Clearance: 53.6 mL/min (by C-G formula based on SCr of 0.97 mg/dL). Liver Function Tests: Recent Labs  Lab 07/01/18 1558 07/02/18 0544  AST 28 18  ALT 8 6  ALKPHOS 89 66  BILITOT 0.5 0.4  PROT 7.9 6.4*  ALBUMIN 4.2 3.4*   No results for input(s): LIPASE, AMYLASE in the last 168 hours. No results for input(s): AMMONIA in the last 168 hours. Coagulation Profile: No results for input(s): INR, PROTIME in the last 168 hours. Cardiac Enzymes: Recent Labs  Lab 07/02/18 1144 07/02/18 1657 07/02/18 2244  TROPONINI 0.07* 0.05* 0.04*   BNP (last 3 results) No results for input(s): PROBNP in the last 8760 hours. HbA1C: No results for input(s): HGBA1C in the last 72 hours. CBG: Recent Labs  Lab 07/02/18 1701 07/02/18 2144  GLUCAP 153* 133*   Lipid Profile: No results for input(s): CHOL, HDL, LDLCALC, TRIG, CHOLHDL, LDLDIRECT in the last 72 hours. Thyroid Function Tests: Recent Labs    07/02/18 0808  TSH 0.475   Anemia Panel: Recent Labs    07/02/18 0808 07/02/18 0830  VITAMINB12 218  --   FOLATE  --  6.0  FERRITIN 20  --   TIBC 402  --   IRON 18*  --    Sepsis Labs: No results for input(s): PROCALCITON, LATICACIDVEN in the last 168 hours.  Recent Results (from the past 240 hour(s))  Urine culture     Status: None   Collection Time: 07/01/18 10:00 PM  Result Value Ref Range Status   Specimen Description   Final    URINE, CLEAN CATCH Performed at [facility], [address] [place], [place] [address]    Special Requests   Final     NONE Performed at [facility], [address] [place], [place] [address]    Culture   Final    Multiple bacterial morphotypes present, none predominant. Suggest appropriate recollection if clinically indicated.   Report Status 07/03/2018 FINAL  Final  SARS Coronavirus 2 (CEPHEID - Performed in  [facility] hospital lab), Hosp Order     Status: None   Collection Time: 07/01/18 10:00 PM  Result Value Ref Range Status   SARS Coronavirus 2 NEGATIVE NEGATIVE Final    Comment: (NOTE) If result is NEGATIVE SARS-CoV-2 target nucleic acids are NOT DETECTED. The SARS-CoV-2 RNA is generally detectable in upper and lower  respiratory specimens during the acute phase of infection. The lowest  concentration of SARS-CoV-2 viral copies this assay can detect is 250  copies / mL. A negative result does not preclude SARS-CoV-2 infection  and should not be used as the sole basis for treatment or other  patient management decisions.  A negative result may occur with  improper specimen collection / handling, submission of specimen other  than nasopharyngeal swab, presence of viral mutation(s) within the  areas targeted by this assay, and inadequate number of viral copies  (<250 copies / mL). A negative result must be combined with clinical  observations, patient history, and epidemiological information. If result is POSITIVE SARS-CoV-2 target nucleic acids are DETECTED. The SARS-CoV-2 RNA is generally detectable in upper and lower  respiratory specimens dur ing the acute phase of infection.  Positive  results are indicative of active infection with SARS-CoV-2.  Clinical  correlation with patient history and other diagnostic information is  necessary to determine patient infection status.  Positive results do  not rule out bacterial infection or co-infection with other viruses. If result is PRESUMPTIVE POSTIVE SARS-CoV-2 nucleic acids MAY BE PRESENT.   A presumptive positive result  was obtained on the submitted specimen  and confirmed on repeat testing.  While 2019 novel coronavirus  (SARS-CoV-2) nucleic acids may be present in the submitted sample  additional confirmatory testing may be necessary for epidemiological  and / or clinical management purposes  to differentiate between  SARS-CoV-2 and other Sarbecovirus currently known to infect humans.  If clinically indicated additional testing with an alternate test  methodology [phone]) is advised. The SARS-CoV-2 RNA is generally  detectable in upper and lower respiratory sp ecimens during the acute  phase of infection. The expected result is Negative. Fact Sheet for Patients:  BoilerBrush.com.cy Fact Sheet for Healthcare Providers: https://pope.com/ This test is not yet approved or cleared by the [place] FDA and has been authorized for detection and/or diagnosis of SARS-CoV-2 by FDA under an Emergency Use Authorization (EUA).  This EUA will remain in effect (meaning this test can be used) for the duration of the COVID-19 declaration under Section 564(b)(1) of the Act, 21 U.S.C. section 360bbb-3(b)(1), unless the authorization is terminated or revoked sooner. Performed at [facility], [address], [place], [place] [address]   MRSA PCR Screening     Status: None   Collection Time: 07/03/18  5:04 PM  Result Value Ref Range Status   MRSA by PCR NEGATIVE NEGATIVE Final    Comment:        The GeneXpert MRSA Assay (FDA approved for NASAL specimens only), is one component of a comprehensive MRSA colonization surveillance program. It is not intended to diagnose MRSA infection nor to guide or monitor treatment for MRSA infections. Performed at [facility], [address] [place], [place] [address]          Radiology Studies: Dg Chest Port 1 View  Result Date: 07/02/2018 CLINICAL DATA:  Dyspnea EXAM: PORTABLE  CHEST 1 VIEW COMPARISON:  08/19/2016 chest radiograph. FINDINGS: Stable configuration of intact sternotomy wires and 2 lead left subclavian pacemaker. Stable  cardiomediastinal silhouette with mild cardiomegaly. No pneumothorax. No pleural effusion. No pulmonary edema. Stable mild scarring versus atelectasis in the left mid lung. No acute consolidative airspace disease. IMPRESSION: Stable mild cardiomegaly without overt pulmonary edema. No active pulmonary disease. Electronically Signed   By: Delbert Phenix M.D.   On: 07/02/2018 11:07        Scheduled Meds: . ALPRAZolam  1 mg Oral QHS  . atorvastatin  20 mg Oral q1800  . cefdinir  300 mg Oral Q12H  . Chlorhexidine Gluconate Cloth  6 each Topical Q0600  . citalopram  20 mg Oral Daily  . docusate sodium  100 mg Oral BID  . enoxaparin (LOVENOX) injection  40 mg Subcutaneous Daily  . fluticasone  2 spray Each Nare Daily  . furosemide  20 mg Oral Daily  . metoprolol tartrate  50 mg Oral BID  . metroNIDAZOLE  1 Applicatorful Vaginal QHS  . mometasone-formoterol  2 puff Inhalation BID  . nicotine  21 mg Transdermal Daily  . pantoprazole  40 mg Oral QAC breakfast  . potassium chloride SA  20 mEq Oral Daily   Continuous Infusions:    LOS: 2 days    Time spent: 40 minutes    Ramiro Harvest, MD [facility]  If 7PM-7AM, please contact night-coverage [email or web address] 07/04/2018, 9:50 AM

## 2018-07-05 DIAGNOSIS — N390 Urinary tract infection, site not specified: Secondary | ICD-10-CM

## 2018-07-05 LAB — CBC
HCT: 35.3 % — ABNORMAL LOW (ref 36.0–46.0)
Hemoglobin: 10.7 g/dL — ABNORMAL LOW (ref 12.0–15.0)
MCH: 28.9 pg (ref 26.0–34.0)
MCHC: 30.3 g/dL (ref 30.0–36.0)
MCV: 95.4 fL (ref 80.0–100.0)
Platelets: 180 10*3/uL (ref 150–400)
RBC: 3.7 MIL/uL — ABNORMAL LOW (ref 3.87–5.11)
RDW: 16.8 % — ABNORMAL HIGH (ref 11.5–15.5)
WBC: 9.8 10*3/uL (ref 4.0–10.5)
nRBC: 0 % (ref 0.0–0.2)

## 2018-07-05 LAB — BASIC METABOLIC PANEL
Anion gap: 9 (ref 5–15)
BUN: 14 mg/dL (ref 8–23)
CO2: 27 mmol/L (ref 22–32)
Calcium: 9.3 mg/dL (ref 8.9–10.3)
Chloride: 102 mmol/L (ref 98–111)
Creatinine, Ser: 0.93 mg/dL (ref 0.44–1.00)
GFR calc Af Amer: >60 mL/min (ref >60)
GFR calc non Af Amer: >60 mL/min (ref >60)
Glucose, Bld: 107 mg/dL — ABNORMAL HIGH (ref 70–99)
Potassium: 4.1 mmol/L (ref 3.5–5.1)
Sodium: 138 mmol/L (ref 135–145)

## 2018-07-05 MED ORDER — DILTIAZEM HCL ER COATED BEADS 120 MG PO CP24
120.0000 mg | ORAL_CAPSULE | Freq: Every day | ORAL | Status: DC
  Administered 2018-07-05 – 2018-07-06 (×2): 120 mg via ORAL
  Filled 2018-07-05 (×2): qty 1

## 2018-07-05 NOTE — Progress Notes (Addendum)
Progress Note  Patient Name: Gabrielle PalauVirginia W [name] Date of Encounter: 07/05/2018  Primary Cardiologist: Donato SchultzMark Skains, MD   Subjective   Somnolent, laying flat comfortably.  Inpatient Medications    Scheduled Meds: . ALPRAZolam  1 mg Oral QHS  . atorvastatin  20 mg Oral q1800  . cefdinir  300 mg Oral Q12H  . Chlorhexidine Gluconate Cloth  6 each Topical Daily  . citalopram  20 mg Oral Daily  . docusate sodium  100 mg Oral BID  . enoxaparin (LOVENOX) injection  40 mg Subcutaneous Daily  . fluticasone  2 spray Each Nare Daily  . furosemide  20 mg Intravenous Q12H  . metoprolol tartrate  50 mg Oral BID  . metroNIDAZOLE  1 Applicatorful Vaginal QHS  . mometasone-formoterol  2 puff Inhalation BID  . nicotine  21 mg Transdermal Daily  . pantoprazole  40 mg Oral QAC breakfast  . potassium chloride SA  20 mEq Oral Daily   Continuous Infusions: . diltiazem (CARDIZEM) infusion 15 mg/hr (07/05/18 0748)   PRN Meds: acetaminophen, albuterol, ondansetron **OR** ondansetron (ZOFRAN) IV, tiZANidine, Zinc Oxide   Vital Signs    Vitals:   07/05/18 0700 07/05/18 0730 07/05/18 0800 07/05/18 1003  BP: (!) 152/127   (!) 125/98  Pulse:    84  Resp: 20     Temp:   97.8 F (36.6 C)   TempSrc:   Oral   SpO2: 97% 99%    Weight:      Height:        Intake/Output Summary (Last 24 hours) at 07/05/2018 1329 Last data filed at 07/05/2018 1108 Gross per 24 hour  Intake 794.46 ml  Output 2325 ml  Net -1530.54 ml   Last 3 Weights 07/05/2018 07/01/2018 01/20/2018  Weight (lbs) 196 lb 13.9 oz 198 lb 213 lb 12.8 oz  Weight (kg) 89.3 kg 89.812 kg 96.979 kg      Telemetry    A-fib with VR in 6770' - Personally Reviewed  ECG    No new tracing - Personally Reviewed  Physical Exam  NAD GEN: No acute distress.   Neck: No JVD Cardiac:i RRR, no murmurs, rubs, or gallops.  Respiratory: Clear to auscultation bilaterally. GI: Soft, nontender, non-distended  MS: minimal edema; No deformity. Neuro:   Nonfocal  Psych: Normal affect   Labs    Chemistry Recent Labs  Lab 07/01/18 1558 07/02/18 0544 07/03/18 0324 07/04/18 1049 07/05/18 0235  NA 140 140 138 138 138  K 3.7 4.1 4.2 3.5 4.1  CL 107 111 110 105 102  CO2 20* 22 21* 27 27  GLUCOSE 184* 126* 119* 204* 107*  BUN 23 22 14 12 14   CREATININE 1.24* 1.08* 0.97 0.97 0.93  CALCIUM 10.0 9.0 9.0 9.4 9.3  PROT 7.9 6.4*  --   --   --   ALBUMIN 4.2 3.4*  --   --   --   AST 28 18  --   --   --   ALT 8 6  --   --   --   ALKPHOS 89 66  --   --   --   BILITOT 0.5 0.4  --   --   --   GFRNONAA 43* 51* 58* 58* >60  GFRAA 50* 59* >60 >60 >60  ANIONGAP 13 7 7 6 9      Hematology Recent Labs  Lab 07/03/18 0324 07/04/18 1049 07/05/18 0235  WBC 9.9 8.5 9.8  RBC 3.67* 3.95 3.70*  HGB 10.6* 11.4* 10.7*  HCT 36.2 39.1 35.3*  MCV 98.6 99.0 95.4  MCH 28.9 28.9 28.9  MCHC 29.3* 29.2* 30.3  RDW 17.0* 16.7* 16.8*  PLT 160 182 180    Cardiac Enzymes Recent Labs  Lab 07/02/18 1144 07/02/18 1657 07/02/18 2244  TROPONINI 0.07* 0.05* 0.04*   No results for input(s): TROPIPOC in the last 168 hours.   BNP Recent Labs  Lab 07/03/18 1635 07/04/18 1049  BNP 893.8* 1,210.2*     DDimer No results for input(s): DDIMER in the last 168 hours.   Radiology    No results found.  Cardiac Studies     Patient Profile     73 y.o. female   Assessment & Plan    1. Afib with RVR, now with ventricular rates in 70'. - Likely triggered by gastroenteritis, anemia. However recently complained of intermittent palpitation. Less than 1% burden of afib on device check 05/09/2018.   - cardizem was discontinued this am, I would start Cardizem CD 120 mg po daily and uptitrate as needed - Rate elevated despite on cardizem gtt and metoprolol. I would increase cardizem to 10, and if tolerate to 15 mg/hr as she is hypertensive, we will hold other BP meds - hydralazine - CHADSVASC score of 4. Not on chronic anticoagulation due to prior GI bleed.    2. Tachy-brady syndrome s/p PPM - Followed by Dr. Ladona Ridgel. As above.  3. Acute on Chronic diastolic CHF - BNP ~900. On low dose IV lasix 20 mg BID, negative 1.3 L overinight, still fluid overloaded, I would switch to lasix 40 mg po daily - crea 0.97, K 4.2  4. Elevated troponin - Flat trend (0.07>>0.05>>0.04). Likely demand in setting of elevated rate.  - no ischemic workup is necessary  5. HTN - BP intermittently elevated.   The patient can be transferred out of ICU to telemetry.  [facility] will sign off.   Medication Recommendations:  As above Other recommendations (labs, testing, etc):  No further testing Follow up as an outpatient:  As needed  For questions or updates, please contact [facility] Please consult [email or web address] for contact info under     Signed, Tobias Alexander, MD  07/05/2018, 1:29 PM

## 2018-07-05 NOTE — Progress Notes (Addendum)
PROGRESS NOTE    Gabrielle [name]  GEX:[id number] DOB: 01-07-1946 DOA: 07/01/2018 PCP: Henrine Screws, MD   Brief Narrative:  HPI per Dr. Aundria Mems Gabrielle [name] is a 73 y.o. female with medical history significant of paroxysmal atrial fibrillation, asthma, COPD, depression, coronary artery disease, hypertension, hyperlipidemia, diastolic dysfunction CHF who came to the ER today with persistent nausea vomiting.  Patient apparently ate some [place] sandwich and since then has been having persistent nausea with vomiting.  She was seen in the ER spent 7 hours getting treatment but no relief.  She was given him multiple doses of Zofran Ativan and Haldol.  Patient has not stopped vomiting and is therefore being admitted to the hospital for symptomatic management.  Patient also reported cough shortness of breath in addition to the vomiting.  She has had some diarrhea.  Which has stopped at the moment.  Denied any fever or chills.  Patient was seen to be bradycardic initially on arrival but has had history of tachybradycardia syndrome.  At this point she is being admitted to the hospital for symptomatic management..  ED Course: Initial temperature 98 for blood pressure 176/73 pulse 135 respiratory rate of 33 oxygen sat 93% room air.Sodium 140 potassium 3.7.  CO2 20 glucose 184.  Creatinine is 1.24.  Magnesium 2.0.  White count is 22.2 thousand.  The rest of the CBC appears to be within normal.  Urinalysis showed large leukocytes WBC more than 50.  COVID-19 testing is negative.  Patient being admitted for treatment of intractable nausea vomiting as well as UTI.  Patient was admitted placed on IV fluids and supportive care.  The morning of 07/02/2018 patient was noted to go into A. fib with RVR with heart rate sustaining in the 170s.  Patient given a 10 mg IV push of Cardizem however heart rate remained still elevated in addition to Lopressor 25 mg x 1.  Patient subsequently transferred to the stepdown unit  and placed on a Cardizem drip.   Assessment & Plan:   Principal Problem:   Atrial fibrillation with RVR (HCC) Active Problems:   Acute on chronic diastolic CHF (congestive heart failure) /EF 55 %   Nausea and/or vomiting   Hyperlipidemia   NICOTINE ADDICTION   Depression   Atrial fibrillation (HCC)   Obesity   PAF (paroxysmal atrial fibrillation) (HCC)   Tachy-brady syndrome (HCC)   Chronic diastolic CHF (congestive heart failure) (HCC)   Acute lower UTI   Nausea and vomiting   Urinary tract infection without hematuria   1 A. fib with RVR Questionable etiology.  Patient did state was on Lopressor 50 mg nightly however patient did not receive Lopressor on admission.  Patient denies any chest pain or shortness of breath.  Check a chest x-ray.  Cardiac enzymes is minimally but trending down.  Patient denies any chest pain.  TSH is 0.475.Marland Kitchen  EKG consistent with A. fib with RVR.  Patient with 2D echo done on 05/17/2016 with a EF of 55%, akinesis of apical myocardium, basal inferior myocardium, apical anterior myocardium, grade 3 diastolic dysfunction, mild aortic valvular regurgitation, moderate mitral valvular regurgitation, right ventricular systolic pressure increased consistent with moderate pulmonary hypertension.  Patient was placed on a Cardizem drip and Lopressor adjusted to 37.5 twice daily on 07/03/2018.  Patient noted to be off Cardizem drip on 07/03/2018 and converted back to normal sinus rhythm.  The morning 07/04/2018 patient is back in A. fib with RVR with heart rate sustaining in the 140s.  Lopressor has been increased to 50 mg twice daily.  Patient on Cardizem drip this morning.  Patient deemed not a anticoagulation candidate secondary to history of GI bleed.  Continue aspirin.  Due to difficulty controlling patient's heart rate and going in and out of A. fib cardiology has been consulted and appreciate input and recommendations.  2.  Nausea and vomiting/gastroenteritis Patient had  presented intractable nausea and vomiting felt secondary to an acute gastroenteritis as symptoms occurred after eating a [place] sandwich.  Patient denies any further nausea or emesis this morning and tolerating clear liquids.  Patient also being treated for UTI with antibiotics.  Patient tolerating current diet. Continue IV antiemetics, IV Ativan as needed, supportive care.  Patient currently on a soft diet and tolerating.  Continue supportive care.  Follow.    3.  UTI Urinalysis consistent with a UTI.  Urine cultures have a urinalysis with pyuria, rare bacteria with epithelial cells.  Currently on oral antibiotics.  Concern for possible atrophic vaginitis versus bacterial vaginosis.  Patient placed on the vaginal cream with some improvement with symptoms..  Continue Omnicef and treat for total of 5 days.   4.  Tobacco abuse Tobacco cessation.  Nicotine patch.  5.  Acute on chronic diastolic heart failure Patient with some complaints of shortness of breath  patient noted to have some bibasilar crackles as well as JVD.  Likely secondary to A. fib with RVR.  IV fluids have been discontinued.  Patient now on IV Lasix with a urine output of 2.975 L over the past 24 hours. Strict I's and O's.  Daily weights.  Last 2D echo from 2018 with a EF of 55% and grade 3 diastolic dysfunction.  Continue current dose of Lopressor, Lipitor.  Cardiology following and appreciate their input and recommendations.   6.  Gastroesophageal reflux disease Continue PPI.  7.  Depression/anxiety Stable.  Continue Celexa and Xanax.  8 anemia/iron deficiency anemia. Patient with no overt bleeding.  Likely dilutional.  Anemia panel with iron level of 18, folate of 6, ferritin of 20.  TIBC of 402.  Hemoglobin currently stable at 10.7.  Transfusion threshold hemoglobin less than 7.  Follow H&H.  Will need oral iron supplementation on discharge.    DVT prophylaxis: Lovenox Code Status: Full Family Communication: Updated patient.   No family at bedside. Disposition Plan: Transfer to telemetry once off Cardizem drip and per cardiology.    Consultants:   Cardiology: Dr. Delton See 07/04/2018  Procedures:  Chest x-ray  07/02/2018  Antimicrobials:   IV Rocephin 07/01/2018>>>> 07/03/2018  Omnicef 07/03/2018   Subjective: Patient sleeping however easily arousable.  Cardizem drip was turned back on overnight.  Heart rate now in the 80s.  Patient still with some shortness of breath have a slight improvement from yesterday.  Patient denies any chest pain.  Dysuria improving.  No further nausea or vomiting.  Tolerating current diet.  Objective: Vitals:   07/05/18 0730 07/05/18 0800 07/05/18 1003 07/05/18 1503  BP:   (!) 125/98 (!) 147/58  Pulse:   84   Resp:      Temp:  97.8 F (36.6 C)    TempSrc:  Oral    SpO2: 99%     Weight:      Height:        Intake/Output Summary (Last 24 hours) at 07/05/2018 1508 Last data filed at 07/05/2018 1108 Gross per 24 hour  Intake 764.46 ml  Output 1900 ml  Net -1135.54 ml   [employer]  07/01/18 1440 07/05/18 0500  Weight: 89.8 kg 89.3 kg    Examination:  General exam: Appears calm and comfortable  Respiratory system: Bibasilar crackles.  No wheezing.  Fair air movement.  Cardiovascular system: Irregularly irregular.  + JVD, no murmurs, no rubs, no gallops.  No lower extremity edema.  Gastrointestinal system: Abdomen is nontender, nondistended benign soft, positive bowel sounds.  No rebound.  No guarding.  Central nervous system: Alert and oriented. No focal neurological deficits. Extremities: Symmetric 5 x 5 power. Skin: No rashes, lesions or ulcers Psychiatry: Judgement and insight appear normal. Mood & affect appropriate.     Data Reviewed: I have personally reviewed following labs and imaging studies  CBC: Recent Labs  Lab 07/01/18 1558 07/02/18 0544 07/03/18 0324 07/04/18 1049 07/05/18 0235  WBC 22.2* 12.3* 9.9 8.5 9.8  NEUTROABS 19.5*  --   --  6.0  --    HGB 12.1 9.6* 10.6* 11.4* 10.7*  HCT 39.1 32.8* 36.2 39.1 35.3*  MCV 93.8 95.9 98.6 99.0 95.4  PLT 225 158 160 182 180   Basic Metabolic Panel: Recent Labs  Lab 07/01/18 1558 07/02/18 0544 07/03/18 0324 07/04/18 1049 07/05/18 0235  NA 140 140 138 138 138  K 3.7 4.1 4.2 3.5 4.1  CL 107 111 110 105 102  CO2 20* 22 21* 27 27  GLUCOSE 184* 126* 119* 204* 107*  BUN 23 22 14 12 14   CREATININE 1.24* 1.08* 0.97 0.97 0.93  CALCIUM 10.0 9.0 9.0 9.4 9.3  MG 2.0 2.1 1.9 1.8  --    GFR: Estimated Creatinine Clearance: 55.7 mL/min (by C-G formula based on SCr of 0.93 mg/dL). Liver Function Tests: Recent Labs  Lab 07/01/18 1558 07/02/18 0544  AST 28 18  ALT 8 6  ALKPHOS 89 66  BILITOT 0.5 0.4  PROT 7.9 6.4*  ALBUMIN 4.2 3.4*   No results for input(s): LIPASE, AMYLASE in the last 168 hours. No results for input(s): AMMONIA in the last 168 hours. Coagulation Profile: No results for input(s): INR, PROTIME in the last 168 hours. Cardiac Enzymes: Recent Labs  Lab 07/02/18 1144 07/02/18 1657 07/02/18 2244  TROPONINI 0.07* 0.05* 0.04*   BNP (last 3 results) No results for input(s): PROBNP in the last 8760 hours. HbA1C: No results for input(s): HGBA1C in the last 72 hours. CBG: Recent Labs  Lab 07/02/18 1701 07/02/18 2144  GLUCAP 153* 133*   Lipid Profile: No results for input(s): CHOL, HDL, LDLCALC, TRIG, CHOLHDL, LDLDIRECT in the last 72 hours. Thyroid Function Tests: No results for input(s): TSH, T4TOTAL, FREET4, T3FREE, THYROIDAB in the last 72 hours. Anemia Panel: No results for input(s): VITAMINB12, FOLATE, FERRITIN, TIBC, IRON, RETICCTPCT in the last 72 hours. Sepsis Labs: No results for input(s): PROCALCITON, LATICACIDVEN in the last 168 hours.  Recent Results (from the past 240 hour(s))  Urine culture     Status: None   Collection Time: 07/01/18 10:00 PM  Result Value Ref Range Status   Specimen Description   Final    URINE, CLEAN CATCH Performed at  [facility], [address] [place], [place] [address]    Special Requests   Final    NONE Performed at [facility], [address] [place], [place] [address]    Culture   Final    Multiple bacterial morphotypes present, none predominant. Suggest appropriate recollection if clinically indicated.   Report Status 07/03/2018 FINAL  Final  SARS Coronavirus 2 (CEPHEID - Performed in [facility] lab), Hosp Order     Status: None   Collection Time: 07/01/18 10:00 PM  Result Value Ref Range Status   SARS Coronavirus 2 NEGATIVE NEGATIVE Final    Comment: (NOTE) If result is NEGATIVE SARS-CoV-2 target nucleic acids are NOT DETECTED. The SARS-CoV-2 RNA is generally detectable in upper and lower  respiratory specimens during the acute phase of infection. The lowest  concentration of SARS-CoV-2 viral copies this assay can detect is 250  copies / mL. A negative result does not preclude SARS-CoV-2 infection  and should not be used as the sole basis for treatment or other  patient management decisions.  A negative result may occur with  improper specimen collection / handling, submission of specimen other  than nasopharyngeal swab, presence of viral mutation(s) within the  areas targeted by this assay, and inadequate number of viral copies  (<250 copies / mL). A negative result must be combined with clinical  observations, patient history, and epidemiological information. If result is POSITIVE SARS-CoV-2 target nucleic acids are DETECTED. The SARS-CoV-2 RNA is generally detectable in upper and lower  respiratory specimens dur ing the acute phase of infection.  Positive  results are indicative of active infection with SARS-CoV-2.  Clinical  correlation with patient history and other diagnostic information is  necessary to determine patient infection status.  Positive results do  not rule out bacterial infection or co-infection with other  viruses. If result is PRESUMPTIVE POSTIVE SARS-CoV-2 nucleic acids MAY BE PRESENT.   A presumptive positive result was obtained on the submitted specimen  and confirmed on repeat testing.  While 2019 novel coronavirus  (SARS-CoV-2) nucleic acids may be present in the submitted sample  additional confirmatory testing may be necessary for epidemiological  and / or clinical management purposes  to differentiate between  SARS-CoV-2 and other Sarbecovirus currently known to infect humans.  If clinically indicated additional testing with an alternate test  methodology [phone]) is advised. The SARS-CoV-2 RNA is generally  detectable in upper and lower respiratory sp ecimens during the acute  phase of infection. The expected result is Negative. Fact Sheet for Patients:  BoilerBrush.com.cy Fact Sheet for Healthcare Providers: https://pope.com/ This test is not yet approved or cleared by the [place] FDA and has been authorized for detection and/or diagnosis of SARS-CoV-2 by FDA under an Emergency Use Authorization (EUA).  This EUA will remain in effect (meaning this test can be used) for the duration of the COVID-19 declaration under Section 564(b)(1) of the Act, 21 U.S.C. section 360bbb-3(b)(1), unless the authorization is terminated or revoked sooner. Performed at [facility], [address], [place], [place] [address]   MRSA PCR Screening     Status: None   Collection Time: 07/03/18  5:04 PM  Result Value Ref Range Status   MRSA by PCR NEGATIVE NEGATIVE Final    Comment:        The GeneXpert MRSA Assay (FDA approved for NASAL specimens only), is one component of a comprehensive MRSA colonization surveillance program. It is not intended to diagnose MRSA infection nor to guide or monitor treatment for MRSA infections. Performed at [facility], [address], [place], [place] [address]           Radiology Studies: No results found.      Scheduled Meds: . ALPRAZolam  1 mg Oral QHS  . atorvastatin  20 mg Oral q1800  . cefdinir  300 mg Oral Q12H  . Chlorhexidine Gluconate Cloth  6 each Topical Daily  . citalopram  20 mg Oral Daily  . diltiazem  120 mg Oral Daily  . docusate sodium  100 mg Oral BID  . enoxaparin (LOVENOX) injection  40 mg Subcutaneous Daily  . fluticasone  2 spray Each Nare Daily  . furosemide  20 mg Intravenous Q12H  . metoprolol tartrate  50 mg Oral BID  . metroNIDAZOLE  1 Applicatorful Vaginal QHS  . mometasone-formoterol  2 puff Inhalation BID  . nicotine  21 mg Transdermal Daily  . pantoprazole  40 mg Oral QAC breakfast  . potassium chloride SA  20 mEq Oral Daily   Continuous Infusions:    LOS: 3 days    Time spent: 40 minutes    Ramiro Harvest, MD [facility]  If 7PM-7AM, please contact night-coverage [email or web address] 07/05/2018, 3:08 PM

## 2018-07-06 LAB — BASIC METABOLIC PANEL
Anion gap: 9 (ref 5–15)
BUN: 16 mg/dL (ref 8–23)
CO2: 31 mmol/L (ref 22–32)
Calcium: 9.6 mg/dL (ref 8.9–10.3)
Chloride: 101 mmol/L (ref 98–111)
Creatinine, Ser: 0.89 mg/dL (ref 0.44–1.00)
GFR calc Af Amer: >60 mL/min (ref >60)
GFR calc non Af Amer: >60 mL/min (ref >60)
Glucose, Bld: 127 mg/dL — ABNORMAL HIGH (ref 70–99)
Potassium: 4 mmol/L (ref 3.5–5.1)
Sodium: 141 mmol/L (ref 135–145)

## 2018-07-06 LAB — HEMOGLOBIN AND HEMATOCRIT, BLOOD
HCT: 37.1 % (ref 36.0–46.0)
Hemoglobin: 11.2 g/dL — ABNORMAL LOW (ref 12.0–15.0)

## 2018-07-06 MED ORDER — GUAIFENESIN-DM 100-10 MG/5ML PO SYRP
5.0000 mL | ORAL_SOLUTION | ORAL | Status: DC | PRN
  Administered 2018-07-06: 5 mL via ORAL
  Filled 2018-07-06: qty 10

## 2018-07-06 MED ORDER — NICOTINE 21 MG/24HR TD PT24: 21.0000 mg | MEDICATED_PATCH | Freq: Every day | TRANSDERMAL | 0 refills | Status: DC

## 2018-07-06 MED ORDER — CEFDINIR 300 MG PO CAPS: 300.0000 mg | ORAL_CAPSULE | Freq: Two times a day (BID) | ORAL | 0 refills | Status: DC

## 2018-07-06 MED ORDER — DILTIAZEM HCL ER COATED BEADS 120 MG PO CP24: 120.0000 mg | ORAL_CAPSULE | Freq: Every day | ORAL | 1 refills | Status: DC

## 2018-07-06 NOTE — Discharge Summary (Signed)
Physician Discharge Summary  Gabrielle [name] GIT:[id number] DOB: 1945-11-03 DOA: 07/01/2018  PCP: Henrine Screws, MD  Admit date: 07/01/2018 Discharge date: 07/06/2018  Time spent: 40 minutes  Recommendations for Outpatient Follow-up:  1. Complete course of Omnicef for urinary infection 2. New medication of Cardizem 120 XL for heart rate control 3. Needs outpatient OSA testing to be coordinated by cardiology and PCP 4. Recommend echo in 1 year 5. Needs Chem-12 as well as adjustment of potassium dosing as an outpatient 6. Needs further reinforcement of smoking cessation if pre-contemplative as an outpatient  Discharge Diagnoses:  Principal Problem:   Atrial fibrillation with RVR (HCC) Active Problems:   Hyperlipidemia   NICOTINE ADDICTION   Depression   Atrial fibrillation (HCC)   Obesity   PAF (paroxysmal atrial fibrillation) (HCC)   Tachy-brady syndrome (HCC)   Chronic diastolic CHF (congestive heart failure) (HCC)   Acute on chronic diastolic CHF (congestive heart failure) /EF 55 %   Nausea and/or vomiting   Acute lower UTI   Nausea and vomiting   Urinary tract infection without hematuria   Discharge Condition: Improved  Diet recommendation: Heart healthy  Filed Weights   07/01/18 1440 07/05/18 0500  Weight: 89.8 kg 89.3 kg    History of present illness:  73 year old Caucasian female 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  Still smoking CAD status post bypass 2010 Multiple episodes of GI bleed off Eliquis because of this Bipolar Nicotine habituation Psoriasis   Patient was admitted 5/1 with persistent nausea vomiting?  Secondary to gastroenteritis Also Rx on admission for urinary infection  noted to have a leukocytosis on admission and a respiratory rate of 33 She flipped into A. fib with RVR and cardiology was consulted--- she was eventually transitioned to Cardizem XR after  being on Cardizem gtt. and multiple transitions to stepdown level of care and stabilized  She does get a home health nurse that called her and checks on her weight and she has a walker at home She was able to ambulate 50 feet in the hallway without a drop in her oxygen sats but given her habitus I think she would benefit from [facility] screening an outpatient split study for sleep apnea  I had a long discussion with her son Smitty Cords who understands these details and we will order home health to resume on discharge   Procedures:  Echo performed this admission - Pulmonary arteries: PA peak pressure: 59 mm Hg (S).   - The right ventricular systolic pressure was increased consistent   with moderate pulmonary hypertension.    Consultations:  Cardiology  Discharge Exam: Vitals:   07/06/18 0612 07/06/18 1043  BP: (!) 153/78   Pulse: 75 82  Resp: 17   Temp: 98 F (36.7 C)   SpO2: 98% 93%    General: Awake alert pleasant no distress EOMI NCAT no JVD Cardiovascular: S1-S2 no murmur-on monitors sinus rhythm Respiratory: Decreased air entry no added sound no rales no rhonchi No lower extremity edema abdomen obese nontender thick neck Mallampati 4  Discharge Instructions   Discharge Instructions    Diet - low sodium heart healthy   Complete by:  As directed    Discharge instructions   Complete by:  As directed    New medicine for heart rate control cardizem--take daily finish antibiotics For urinary infection and pick these up from your pharmacy Follow up for labs with cardiology or primary care Labs in 1 week Please  try to quit smoking completely if you can we know it is hard I would recommend that you also get follow-up with your primary physician and get screened for sleep apnea as an outpatient   Increase activity slowly   Complete by:  As directed      Allergies as of 07/06/2018      Reactions   Novocain [procaine] Hives, Palpitations   Penicillins Hives, Other (See Comments)    Has patient had a PCN reaction causing immediate rash, facial/tongue/throat swelling, SOB or lightheadedness with hypotension: No Has patient had a PCN reaction causing severe rash involving mucus membranes or skin necrosis: No Has patient had a PCN reaction that required hospitalization No Has patient had a PCN reaction occurring within the last 10 years: No If all of the above answers are "NO", then may proceed with Cephalosporin use.      Medication List    STOP taking these medications   Cosentyx Sensoready (300 MG) 150 MG/ML Soaj Generic drug:  Secukinumab (300 MG Dose)   ranitidine 150 MG tablet Commonly known as:  ZANTAC     TAKE these medications   albuterol 108 (90 Base) MCG/ACT inhaler Commonly known as:  VENTOLIN HFA Inhale 2 puffs into the lungs every 6 (six) hours as needed for wheezing or shortness of breath.   ALPRAZolam 1 MG tablet Commonly known as:  XANAX Take 1 mg by mouth at bedtime.   atorvastatin 20 MG tablet Commonly known as:  LIPITOR Take 1 tablet (20 mg total) by mouth daily.   budesonide-formoterol 160-4.5 MCG/ACT inhaler Commonly known as:  Symbicort Inhale 2 puffs into the lungs 2 (two) times daily.   cefdinir 300 MG capsule Commonly known as:  OMNICEF Take 1 capsule (300 mg total) by mouth every 12 (twelve) hours.   citalopram 20 MG tablet Commonly known as:  CELEXA Take 20 mg by mouth daily.   clobetasol cream 0.05 % Commonly known as:  TEMOVATE Apply 1 application topically 2 (two) times daily. Affected skin   diltiazem 120 MG 24 hr capsule Commonly known as:  CARDIZEM CD Take 1 capsule (120 mg total) by mouth daily.   docusate sodium 100 MG capsule Commonly known as:  COLACE Take 100 mg by mouth 2 (two) times daily.   fluticasone 50 MCG/ACT nasal spray Commonly known as:  FLONASE Place 2 sprays into both nostrils daily.   furosemide 20 MG tablet Commonly known as:  LASIX Take 1 tablet (20 mg total) by mouth daily.    metoprolol tartrate 50 MG tablet Commonly known as:  LOPRESSOR Take 1 tablet (50 mg total) by mouth at bedtime. May take an additional 50 mg (one tablet) as needed for palpitations.   nicotine 21 mg/24hr patch Commonly known as:  NICODERM CQ - dosed in mg/24 hours Place 1 patch (21 mg total) onto the skin daily.   pantoprazole 40 MG tablet Commonly known as:  PROTONIX Take 1 tablet (40 mg total) by mouth daily before breakfast.   potassium chloride SA 20 MEQ tablet Commonly known as:  K-DUR Take 1 tablet (20 mEq total) by mouth daily.   tiZANidine 4 MG tablet Commonly known as:  ZANAFLEX Take 4 mg by mouth at bedtime as needed for muscle spasms.   TYLENOL 8 HOUR ARTHRITIS PAIN PO Take 2 tablets by mouth 2 (two) times daily. Back pain      Allergies  Allergen Reactions  . Novocain [Procaine] Hives and Palpitations  . Penicillins Hives and Other (  See Comments)    Has patient had a PCN reaction causing immediate rash, facial/tongue/throat swelling, SOB or lightheadedness with hypotension: No Has patient had a PCN reaction causing severe rash involving mucus membranes or skin necrosis: No Has patient had a PCN reaction that required hospitalization No Has patient had a PCN reaction occurring within the last 10 years: No If all of the above answers are "NO", then may proceed with Cephalosporin use.      The results of significant diagnostics from this hospitalization (including imaging, microbiology, ancillary and laboratory) are listed below for reference.    Significant Diagnostic Studies: Dg Chest Port 1 View  Result Date: 07/02/2018 CLINICAL DATA:  Dyspnea EXAM: PORTABLE CHEST 1 VIEW COMPARISON:  08/19/2016 chest radiograph. FINDINGS: Stable configuration of intact sternotomy wires and 2 lead left subclavian pacemaker. Stable cardiomediastinal silhouette with mild cardiomegaly. No pneumothorax. No pleural effusion. No pulmonary edema. Stable mild scarring versus atelectasis  in the left mid lung. No acute consolidative airspace disease. IMPRESSION: Stable mild cardiomegaly without overt pulmonary edema. No active pulmonary disease. Electronically Signed   By: Delbert PhenixJason A Poff M.D.   On: 07/02/2018 11:07    Microbiology: Recent Results (from the past 240 hour(s))  Urine culture     Status: None   Collection Time: 07/01/18 10:00 PM  Result Value Ref Range Status   Specimen Description   Final    URINE, CLEAN CATCH Performed at [facility], [address] [place], [place] [address]    Special Requests   Final    NONE Performed at [facility], [address] [place], [place] [address]    Culture   Final    Multiple bacterial morphotypes present, none predominant. Suggest appropriate recollection if clinically indicated.   Report Status 07/03/2018 FINAL  Final  SARS Coronavirus 2 (CEPHEID - Performed in [facility] lab), Hosp Order     Status: None   Collection Time: 07/01/18 10:00 PM  Result Value Ref Range Status   SARS Coronavirus 2 NEGATIVE NEGATIVE Final    Comment: (NOTE) If result is NEGATIVE SARS-CoV-2 target nucleic acids are NOT DETECTED. The SARS-CoV-2 RNA is generally detectable in upper and lower  respiratory specimens during the acute phase of infection. The lowest  concentration of SARS-CoV-2 viral copies this assay can detect is 250  copies / mL. A negative result does not preclude SARS-CoV-2 infection  and should not be used as the sole basis for treatment or other  patient management decisions.  A negative result may occur with  improper specimen collection / handling, submission of specimen other  than nasopharyngeal swab, presence of viral mutation(s) within the  areas targeted by this assay, and inadequate number of viral copies  (<250 copies / mL). A negative result must be combined with clinical  observations, patient history, and epidemiological information. If result is  POSITIVE SARS-CoV-2 target nucleic acids are DETECTED. The SARS-CoV-2 RNA is generally detectable in upper and lower  respiratory specimens dur ing the acute phase of infection.  Positive  results are indicative of active infection with SARS-CoV-2.  Clinical  correlation with patient history and other diagnostic information is  necessary to determine patient infection status.  Positive results do  not rule out bacterial infection or co-infection with other viruses. If result is PRESUMPTIVE POSTIVE SARS-CoV-2 nucleic acids MAY BE PRESENT.   A presumptive positive result was obtained on the submitted specimen  and confirmed on repeat testing.  While 2019 novel coronavirus  (  SARS-CoV-2) nucleic acids may be present in the submitted sample  additional confirmatory testing may be necessary for epidemiological  and / or clinical management purposes  to differentiate between  SARS-CoV-2 and other Sarbecovirus currently known to infect humans.  If clinically indicated additional testing with an alternate test  methodology [phone]) is advised. The SARS-CoV-2 RNA is generally  detectable in upper and lower respiratory sp ecimens during the acute  phase of infection. The expected result is Negative. Fact Sheet for Patients:  BoilerBrush.com.cy Fact Sheet for Healthcare Providers: https://pope.com/ This test is not yet approved or cleared by the [place] FDA and has been authorized for detection and/or diagnosis of SARS-CoV-2 by FDA under an Emergency Use Authorization (EUA).  This EUA will remain in effect (meaning this test can be used) for the duration of the COVID-19 declaration under Section 564(b)(1) of the Act, 21 U.S.C. section 360bbb-3(b)(1), unless the authorization is terminated or revoked sooner. Performed at [facility], [address], [place], [place] [address]   MRSA PCR Screening     Status: None    Collection Time: 07/03/18  5:04 PM  Result Value Ref Range Status   MRSA by PCR NEGATIVE NEGATIVE Final    Comment:        The GeneXpert MRSA Assay (FDA approved for NASAL specimens only), is one component of a comprehensive MRSA colonization surveillance program. It is not intended to diagnose MRSA infection nor to guide or monitor treatment for MRSA infections. Performed at [facility], [address] [place], [place] [address]      Labs: Basic Metabolic Panel: Recent Labs  Lab 07/01/18 1558 07/02/18 0544 07/03/18 0324 07/04/18 1049 07/05/18 0235 07/06/18 0517  NA 140 140 138 138 138 141  K 3.7 4.1 4.2 3.5 4.1 4.0  CL 107 111 110 105 102 101  CO2 20* 22 21* GLUCOSE 184* 126* 119* 204* 107* 127*  BUN CREATININE 1.24* 1.08* 0.97 0.97 0.93 0.89  CALCIUM 10.0 9.0 9.0 9.4 9.3 9.6  MG 2.0 2.1 1.9 1.8  --   --    Liver Function Tests: Recent Labs  Lab 07/01/18 1558 07/02/18 0544  AST 28 18  ALT 8 6  ALKPHOS 89 66  BILITOT 0.5 0.4  PROT 7.9 6.4*  ALBUMIN 4.2 3.4*   No results for input(s): LIPASE, AMYLASE in the last 168 hours. No results for input(s): AMMONIA in the last 168 hours. CBC: Recent Labs  Lab 07/01/18 1558 07/02/18 0544 07/03/18 0324 07/04/18 1049 07/05/18 0235 07/06/18 0517  WBC 22.2* 12.3* 9.9 8.5 9.8  --   NEUTROABS 19.5*  --   --  6.0  --   --   HGB 12.1 9.6* 10.6* 11.4* 10.7* 11.2*  HCT 39.1 32.8* 36.2 39.1 35.3* 37.1  MCV 93.8 95.9 98.6 99.0 95.4  --   PLT 225 158 160 182 180  --    Cardiac Enzymes: Recent Labs  Lab 07/02/18 1144 07/02/18 1657 07/02/18 2244  TROPONINI 0.07* 0.05* 0.04*   BNP: BNP (last 3 results) Recent Labs    07/03/18 1635 07/04/18 1049  BNP 893.8* 1,210.2*    ProBNP (last 3 results) No results for input(s): PROBNP in the last 8760 hours.  CBG: Recent Labs  Lab 07/02/18 1701 07/02/18 2144  GLUCAP 153* 133*       Signed:  Rhetta Mura MD   [facility] 07/06/2018, 10:51 AM

## 2018-07-06 NOTE — TOC Transition Note (Signed)
Transition of Care [facility]) - CM/SW Discharge Note   Patient Details  Name: Gabrielle [name] MRN: [id number] Date of Birth: 06-08-1945  Transition of Care [facility]) CM/SW Contact:  Lanier Clam, RN Phone Number: 07/06/2018, 1:02 PM   Clinical Narrative:  [facility] able to accept-HHRN/HHPT. No further CM needs.     Final next level of care: Home w Home Health Services Barriers to Discharge: No Barriers Identified   Patient Goals and CMS Choice   CMS Medicare.gov Compare Post Acute Care list provided to:: Patient Choice offered to / list presented to : Patient  Discharge Placement                       Discharge Plan and Services                          HH Arranged: RN, PT [facility] Agency: [facility] Date [facility] Agency Contacted: 07/06/18 Time HH Agency Contacted: 1252 Representative spoke with at [facility] Agency: Clydie Braun  Social Determinants of Health (SDOH) Interventions     Readmission Risk Interventions No flowsheet data found.

## 2018-07-06 NOTE — Care Management Important Message (Signed)
Important Message  Patient Details IM Letter given to [facility] Case Manager to present to the Patient Name: Gabrielle [name] MRN: [id number] Date of Birth: 1945-05-29   Medicare Important Message Given:  Yes    Caren Macadam 07/06/2018, 10:37 AM

## 2018-07-08 ENCOUNTER — Other Ambulatory Visit: Payer: Self-pay | Admitting: Cardiology

## 2018-07-08 DIAGNOSIS — R002 Palpitations: Secondary | ICD-10-CM

## 2018-07-08 MED ORDER — POTASSIUM CHLORIDE CRYS ER 20 MEQ PO TBCR: 20.0000 meq | EXTENDED_RELEASE_TABLET | Freq: Every day | ORAL | 1 refills | Status: DC

## 2018-07-08 MED ORDER — DILTIAZEM HCL ER COATED BEADS 120 MG PO CP24: 120.0000 mg | ORAL_CAPSULE | Freq: Every day | ORAL | 1 refills | Status: DC

## 2018-07-08 MED ORDER — METOPROLOL TARTRATE 50 MG PO TABS: 50.0000 mg | ORAL_TABLET | Freq: Every day | ORAL | 1 refills | Status: DC

## 2018-07-08 MED ORDER — FUROSEMIDE 20 MG PO TABS: 20.0000 mg | ORAL_TABLET | Freq: Every day | ORAL | 1 refills | Status: DC

## 2018-07-08 MED ORDER — PANTOPRAZOLE SODIUM 40 MG PO TBEC: 40.0000 mg | DELAYED_RELEASE_TABLET | Freq: Every day | ORAL | 1 refills | Status: DC

## 2018-07-08 MED ORDER — ATORVASTATIN CALCIUM 20 MG PO TABS: 20.0000 mg | ORAL_TABLET | Freq: Every day | ORAL | 1 refills | Status: DC

## 2018-07-18 ENCOUNTER — Telehealth: Payer: Self-pay | Admitting: Cardiology

## 2018-07-18 NOTE — Telephone Encounter (Signed)
Patient called stating she is having a lot of GI problems and would like for Dr. Anne Fu to recommend a GI doctor to her.

## 2018-07-18 NOTE — Telephone Encounter (Signed)
Pt was consulted on in hospital in the past by Dr Carman Ching.  Will forward the Dr Anne Fu for recommendation.

## 2018-07-20 NOTE — Telephone Encounter (Signed)
Awaiting Dr Anne Fu review and recommendation then I will call the patient back.

## 2018-07-20 NOTE — Telephone Encounter (Signed)
Follow up    Patient is calling in reference to reference to gastroenterologist.

## 2018-07-21 ENCOUNTER — Telehealth: Payer: Self-pay

## 2018-07-21 ENCOUNTER — Other Ambulatory Visit: Payer: Self-pay | Admitting: Nurse Practitioner

## 2018-07-21 DIAGNOSIS — M545 Low back pain, unspecified: Secondary | ICD-10-CM

## 2018-07-21 NOTE — Telephone Encounter (Signed)
YOUR CARDIOLOGY TEAM HAS ARRANGED FOR AN E-VISIT FOR YOUR APPOINTMENT - PLEASE REVIEW IMPORTANT INFORMATION BELOW SEVERAL DAYS PRIOR TO YOUR APPOINTMENT  Due to the recent COVID-19 pandemic, we are transitioning in-person office visits to tele-medicine visits in an effort to decrease unnecessary exposure to our patients, their families, and staff. These visits are billed to your insurance just like a normal visit is. We also encourage you to sign up for MyChart if you have not already done so. You will need a smartphone if possible. For patients that do not have this, we can still complete the visit using a regular telephone but do prefer a smartphone to enable video when possible. You may have a family member that lives with you that can help. If possible, we also ask that you have a blood pressure cuff and scale at home to measure your blood pressure, heart rate and weight prior to your scheduled appointment. Patients with clinical needs that need an in-person evaluation and testing will still be able to come to the office if absolutely necessary. If you have any questions, feel free to call our office.     YOUR PROVIDER WILL BE USING THE FOLLOWING PLATFORM TO COMPLETE YOUR VISIT: Phone call  . IF USING MYCHART - How to Download the MyChart App to Your SmartPhone   - If Apple, go to App Store and type in MyChart in the search bar and download the app. If Android, ask patient to go to Google Play Store and type in MyChart in the search bar and download the app. The app is free but as with any other app downloads, your phone may require you to verify saved payment information or Apple/Android password.  - You will need to then log into the app with your MyChart username and password, and select [facility] as your healthcare provider to link the account.  - When it is time for your visit, go to the MyChart app, find appointments, and click Begin Video Visit. Be sure to Select Allow for your device to  access the Microphone and Camera for your visit. You will then be connected, and your provider will be with you shortly.  **If you have any issues connecting or need assistance, please contact MyChart service desk [phone] ([phone])**  **If using a computer, in order to ensure the best quality for your visit, you will need to use either of the following Internet Browsers: Google Chrome or Microsoft Edge**  . IF USING DOXIMITY or DOXY.ME - The staff will give you instructions on receiving your link to join the meeting the day of your visit.      2-3 DAYS BEFORE YOUR APPOINTMENT  You will receive a telephone call from one of our [facility] team members - your caller ID may say "Unknown caller." If this is a video visit, we will walk you through how to get the video launched on your phone. We will remind you check your blood pressure, heart rate and weight prior to your scheduled appointment. If you have an Apple Watch or Kardia, please upload any pertinent ECG strips the day before or morning of your appointment to MyChart. Our staff will also make sure you have reviewed the consent and agree to move forward with your scheduled tele-health visit.     THE DAY OF YOUR APPOINTMENT  Approximately 15 minutes prior to your scheduled appointment, you will receive a telephone call from one of HeartCare team - your caller ID may say "Unknown caller."    Our staff will confirm medications, vital signs for the day and any symptoms you may be experiencing. Please have this information available prior to the time of visit start. It may also be helpful for you to have a pad of paper and pen handy for any instructions given during your visit. They will also walk you through joining the smartphone meeting if this is a video visit.    CONSENT FOR TELE-HEALTH VISIT - PLEASE REVIEW  I hereby voluntarily request, consent and authorize [facility] and its employed or contracted physicians, physician  assistants, nurse practitioners or other licensed health care professionals (the Practitioner), to provide me with telemedicine health care services (the "Services") as deemed necessary by the treating Practitioner. I acknowledge and consent to receive the Services by the Practitioner via telemedicine. I understand that the telemedicine visit will involve communicating with the Practitioner through live audiovisual communication technology and the disclosure of certain medical information by electronic transmission. I acknowledge that I have been given the opportunity to request an in-person assessment or other available alternative prior to the telemedicine visit and am voluntarily participating in the telemedicine visit.  I understand that I have the right to withhold or withdraw my consent to the use of telemedicine in the course of my care at any time, without affecting my right to future care or treatment, and that the Practitioner or I may terminate the telemedicine visit at any time. I understand that I have the right to inspect all information obtained and/or recorded in the course of the telemedicine visit and may receive copies of available information for a reasonable fee.  I understand that some of the potential risks of receiving the Services via telemedicine include:  . Delay or interruption in medical evaluation due to technological equipment failure or disruption; . Information transmitted may not be sufficient (e.g. poor resolution of images) to allow for appropriate medical decision making by the Practitioner; and/or  . In rare instances, security protocols could fail, causing a breach of personal health information.  Furthermore, I acknowledge that it is my responsibility to provide information about my medical history, conditions and care that is complete and accurate to the best of my ability. I acknowledge that Practitioner's advice, recommendations, and/or decision may be based on  factors not within their control, such as incomplete or inaccurate data provided by me or distortions of diagnostic images or specimens that may result from electronic transmissions. I understand that the practice of medicine is not an exact science and that Practitioner makes no warranties or guarantees regarding treatment outcomes. I acknowledge that I will receive a copy of this consent concurrently upon execution via email to the email address I last provided but may also request a printed copy by calling the office of [facility].    I understand that my insurance will be billed for this visit.   I have read or had this consent read to me. . I understand the contents of this consent, which adequately explains the benefits and risks of the Services being provided via telemedicine.  . I have been provided ample opportunity to ask questions regarding this consent and the Services and have had my questions answered to my satisfaction. . I give my informed consent for the services to be provided through the use of telemedicine in my medical care  By participating in this telemedicine visit I agree to the above.  

## 2018-07-26 ENCOUNTER — Emergency Department (HOSPITAL_COMMUNITY)
Admission: EM | Admit: 2018-07-26 | Discharge: 2018-07-26 | Disposition: A | Discharge status: Home / Self Care | Payer: Medicare Other | Attending: Emergency Medicine | Admitting: Emergency Medicine

## 2018-07-26 ENCOUNTER — Emergency Department (HOSPITAL_COMMUNITY): Payer: Medicare Other

## 2018-07-26 ENCOUNTER — Other Ambulatory Visit: Payer: Self-pay

## 2018-07-26 ENCOUNTER — Encounter: Payer: Self-pay | Admitting: Cardiology

## 2018-07-26 ENCOUNTER — Encounter (HOSPITAL_COMMUNITY): Payer: Self-pay

## 2018-07-26 ENCOUNTER — Telehealth (INDEPENDENT_AMBULATORY_CARE_PROVIDER_SITE_OTHER): Payer: Medicare Other | Admitting: Cardiology

## 2018-07-26 VITALS — BP 104/55 | HR 62 | Ht 62.5 in | Wt 196.0 lb

## 2018-07-26 DIAGNOSIS — I110 Hypertensive heart disease with heart failure: Secondary | ICD-10-CM | POA: Insufficient documentation

## 2018-07-26 DIAGNOSIS — Z96653 Presence of artificial knee joint, bilateral: Secondary | ICD-10-CM | POA: Diagnosis not present

## 2018-07-26 DIAGNOSIS — R002 Palpitations: Secondary | ICD-10-CM | POA: Diagnosis not present

## 2018-07-26 DIAGNOSIS — I495 Sick sinus syndrome: Secondary | ICD-10-CM

## 2018-07-26 DIAGNOSIS — I2583 Coronary atherosclerosis due to lipid rich plaque: Secondary | ICD-10-CM

## 2018-07-26 DIAGNOSIS — M5415 Radiculopathy, thoracolumbar region: Secondary | ICD-10-CM

## 2018-07-26 DIAGNOSIS — I252 Old myocardial infarction: Secondary | ICD-10-CM | POA: Insufficient documentation

## 2018-07-26 DIAGNOSIS — Z950 Presence of cardiac pacemaker: Secondary | ICD-10-CM

## 2018-07-26 DIAGNOSIS — I2510 Atherosclerotic heart disease of native coronary artery without angina pectoris: Secondary | ICD-10-CM

## 2018-07-26 DIAGNOSIS — J449 Chronic obstructive pulmonary disease, unspecified: Secondary | ICD-10-CM | POA: Insufficient documentation

## 2018-07-26 DIAGNOSIS — F17210 Nicotine dependence, cigarettes, uncomplicated: Secondary | ICD-10-CM | POA: Insufficient documentation

## 2018-07-26 DIAGNOSIS — Z951 Presence of aortocoronary bypass graft: Secondary | ICD-10-CM | POA: Diagnosis not present

## 2018-07-26 DIAGNOSIS — Z79899 Other long term (current) drug therapy: Secondary | ICD-10-CM | POA: Diagnosis not present

## 2018-07-26 DIAGNOSIS — I5033 Acute on chronic diastolic (congestive) heart failure: Secondary | ICD-10-CM | POA: Insufficient documentation

## 2018-07-26 DIAGNOSIS — I5032 Chronic diastolic (congestive) heart failure: Secondary | ICD-10-CM

## 2018-07-26 DIAGNOSIS — R52 Pain, unspecified: Secondary | ICD-10-CM

## 2018-07-26 DIAGNOSIS — I480 Paroxysmal atrial fibrillation: Secondary | ICD-10-CM

## 2018-07-26 DIAGNOSIS — R1031 Right lower quadrant pain: Secondary | ICD-10-CM | POA: Diagnosis present

## 2018-07-26 DIAGNOSIS — J438 Other emphysema: Secondary | ICD-10-CM

## 2018-07-26 MED ORDER — DICLOFENAC SODIUM 1 % TD GEL: 4.0000 g | Freq: Four times a day (QID) | TRANSDERMAL | 0 refills | Status: DC

## 2018-07-26 MED ORDER — DICLOFENAC SODIUM 1 % TD GEL
4.0000 g | Freq: Once | TRANSDERMAL | Status: AC
  Administered 2018-07-26: 4 g via TOPICAL
  Filled 2018-07-26: qty 100

## 2018-07-26 MED ORDER — OXYCODONE HCL 5 MG PO TABS: 2.5000 mg | ORAL_TABLET | Freq: Once | ORAL | Status: DC

## 2018-07-26 NOTE — Progress Notes (Signed)
Virtual Visit via Telephone Note   This visit type was conducted due to national recommendations for restrictions regarding the COVID-19 Pandemic (e.g. social distancing) in an effort to limit this patient's exposure and mitigate transmission in our community.  Due to her co-morbid illnesses, this patient is at least at moderate risk for complications without adequate follow up.  This format is felt to be most appropriate for this patient at this time.  The patient did not have access to video technology/had technical difficulties with video requiring transitioning to audio format only (telephone).  All issues noted in this document were discussed and addressed.  No physical exam could be performed with this format.  Please refer to the patient's chart for her  consent to telehealth for [facility].   Date:  07/26/2018   ID:  Gabrielle [name], Gabrielle [name] 1945/09/09, MRN [id number]  Patient Location: Home Provider Location: Home  PCP:  Henrine Screws, MD  Cardiologist:  Donato Schultz, MD  Electrophysiologist:  None   Evaluation Performed:  Follow-Up Visit  Chief Complaint:  AFIB follow up  History of Present Illness:    Gabrielle [name] is a 73 y.o. female "Ginny" with PAF, HTN, HL, COPD, TOB, CAD post CABG 2011 in hospital 07/2018 for GI bug. Had AFIB RVR. Was given diltiazem 120 CD, with metoprolol.   Has Pacer for backup.   Doing well. Has one refill left on diltiazem.   Pain in bad, sleep issues. Hand on waste in back pain in back across. 10+ pain at times. Has renal stone 10 years ago, Dr. Isabel Caprice.   Still a little dizzy when get's up quickly. Jumps up to go to bathroom, grabs wall for instance. Also has vertigo when turns in bed.   The patient does not have symptoms concerning for COVID-19 infection (fever, chills, cough, or new shortness of breath).    Past Medical History:  Diagnosis Date  . Anemia   . Arthritis   . Asthma   . Atrial fibrillation with controlled  ventricular response (HCC) 11/2009   a. not on anticoagulation due to recurrent GI bleeding while on Eliquis.  . Chronic bronchitis   . Chronic diastolic CHF (congestive heart failure) (HCC)    a. Echo 12/2015: EF 55-60% w/ Grade 3 DD  . Chronic headache   . COPD (chronic obstructive pulmonary disease) (HCC)   . Depression   . Heart attack (HCC)   . Hyperlipidemia   . Hypertension    Past Surgical History:  Procedure Laterality Date  . CAD-CABG     x7, brief post-op Atrial Fib  . CESAREAN SECTION     x2  . COLONOSCOPY WITH PROPOFOL N/A 10/24/2015   Procedure: COLONOSCOPY WITH PROPOFOL;  Surgeon: Carman Ching, MD;  Location[facility] ENDOSCOPY;  Service: Endoscopy;  Laterality: N/A;  . CORONARY ARTERY BYPASS GRAFT  2011  . EP IMPLANTABLE DEVICE N/A 12/26/2015   Procedure: Pacemaker Implant;  Surgeon: Marinus Maw, MD;  Location: [facility] INVASIVE CV LAB;  Service: Cardiovascular;  Laterality: N/A;  . ESOPHAGOGASTRODUODENOSCOPY N/A 10/22/2015   Procedure: ESOPHAGOGASTRODUODENOSCOPY (EGD);  Surgeon: Carman Ching, MD;  Location: [facility] ENDOSCOPY;  Service: Endoscopy;  Laterality: N/A;  . ESOPHAGOGASTRODUODENOSCOPY N/A 12/24/2015   Procedure: ESOPHAGOGASTRODUODENOSCOPY (EGD);  Surgeon: Carman Ching, MD;  Location: [facility] ENDOSCOPY;  Service: Endoscopy;  Laterality: N/A;  . JOINT REPLACEMENT Bilateral   . TONSILLECTOMY    . TOTAL KNEE ARTHROPLASTY       Current Meds  Medication Sig  . Acetaminophen (  TYLENOL 8 HOUR ARTHRITIS PAIN PO) Take 2 tablets by mouth 2 (two) times daily. Back pain  . albuterol (PROVENTIL HFA;VENTOLIN HFA) 108 (90 Base) MCG/ACT inhaler Inhale 2 puffs into the lungs every 6 (six) hours as needed for wheezing or shortness of breath.  . ALPRAZolam (XANAX) 1 MG tablet Take 1 mg by mouth at bedtime.  Marland Kitchen. atorvastatin (LIPITOR) 20 MG tablet Take 1 tablet (20 mg total) by mouth daily.  . budesonide-formoterol (SYMBICORT) 160-4.5 MCG/ACT inhaler Inhale 2 puffs into the lungs 2 (two) times  daily.  . citalopram (CELEXA) 20 MG tablet Take 20 mg by mouth daily.  . clobetasol cream (TEMOVATE) 0.05 % Apply 1 application topically 2 (two) times daily. Affected skin  . diltiazem (CARDIZEM CD) 120 MG 24 hr capsule Take 1 capsule (120 mg total) by mouth daily.  Marland Kitchen. docusate sodium (COLACE) 100 MG capsule Take 100 mg by mouth 2 (two) times daily.   . fluticasone (FLONASE) 50 MCG/ACT nasal spray Place 2 sprays into both nostrils daily.  . furosemide (LASIX) 20 MG tablet Take 1 tablet (20 mg total) by mouth daily.  . metoprolol tartrate (LOPRESSOR) 50 MG tablet Take 1 tablet (50 mg total) by mouth at bedtime. May take an additional 50 mg (one tablet) as needed for palpitations.  . potassium chloride SA (K-DUR) 20 MEQ tablet Take 1 tablet (20 mEq total) by mouth daily.  Marland Kitchen. tiZANidine (ZANAFLEX) 4 MG tablet Take 4 mg by mouth at bedtime as needed for muscle spasms.     Allergies:   Novocain [procaine] and Penicillins   Social History   Tobacco Use  . Smoking status: Current Some Day Smoker    Packs/day: 0.20    Types: Cigarettes  . Smokeless tobacco: Never Used  . Tobacco comment: "Occasionally I will smoke a cigarette"   Substance Use Topics  . Alcohol use: No    Alcohol/week: 0.0 standard drinks  . Drug use: No     Family Hx: The patient's family history includes Diabetes in her mother; Heart attack in her father; Heart disease in her father and mother; Hypertension in her daughter, father, and mother; Lung cancer in her maternal grandfather and paternal grandfather; Stroke in her paternal grandmother.  ROS:   Please see the history of present illness.     All other systems reviewed and are negative.   Prior CV studies:   The following studies were reviewed today:  ECHO 2018: - Left ventricle: The cavity size was mildly dilated. Systolic   function was normal. The estimated ejection fraction was 55%.   There is akinesis of the apical myocardium. There is akinesis of   the  basalinferior myocardium. There is akinesis of the   apicalanterior myocardium. There was a reduced contribution of   atrial contraction to ventricular filling, due to increased   ventricular diastolic pressure or atrial contractile dysfunction.   Doppler parameters are consistent with a reversible restrictive   pattern, indicative of decreased left ventricular diastolic   compliance and/or increased left atrial pressure (grade 3   diastolic dysfunction). Doppler parameters are consistent with   high ventricular filling pressure. - Aortic valve: There was mild regurgitation. - Mitral valve: Severely calcified annulus. There was moderate   regurgitation directed posteriorly. Mean gradient (D): 7 mm Hg. - Left atrium: The atrium was mildly dilated. - Right ventricle: Pacer wire or catheter noted in right ventricle. - Right atrium: The atrium was mildly dilated. - Tricuspid valve: There was moderate regurgitation. -  Pulmonic valve: There was mild regurgitation. - Pulmonary arteries: PA peak pressure: 59 mm Hg (S).  Labs/Other Tests and Data Reviewed:    EKG:  An ECG dated 07/01/2018 was personally reviewed today and demonstrated:  AFIB 126  Recent Labs: 07/02/2018: ALT 6; TSH 0.475 07/04/2018: B Natriuretic Peptide 1,210.2; Magnesium 1.8 07/05/2018: Platelets 180 07/06/2018: BUN 16; Creatinine, Ser 0.89; Hemoglobin 11.2; Potassium 4.0; Sodium 141   Recent Lipid Panel Lab Results  Component Value Date/Time   CHOL  11/28/2009 03:43 AM    112        ATP III CLASSIFICATION:  <200     mg/dL   Desirable  161-096  mg/dL   Borderline High  >=045    mg/dL   High          TRIG 99 11/28/2009 03:43 AM   HDL 52 11/28/2009 03:43 AM   CHOLHDL 2.2 11/28/2009 03:43 AM   LDLCALC  11/28/2009 03:43 AM    40        Total Cholesterol/HDL:CHD Risk Coronary Heart Disease Risk Table                     Men   Women  1/2 Average Risk   3.4   3.3  Average Risk       5.0   4.4  2 X Average Risk   9.6   7.1   3 X Average Risk  23.4   11.0        Use the calculated Patient Ratio above and the CHD Risk Table to determine the patient's CHD Risk.        ATP III CLASSIFICATION (LDL):  <100     mg/dL   Optimal  409-811  mg/dL   Near or Above                    Optimal  130-159  mg/dL   Borderline  914-782  mg/dL   High  >956     mg/dL   Very High    Wt Readings from Last 3 Encounters:  07/26/18 196 lb (88.9 kg)  07/05/18 196 lb 13.9 oz (89.3 kg)  01/20/18 213 lb 12.8 oz (97 kg)     Objective:    Vital Signs:  BP (!) 104/55   Pulse 62   Ht 5' 2.5" (1.588 m)   Wt 196 lb (88.9 kg)   LMP  (LMP Unknown)   SpO2 98%   BMI 35.28 kg/m    VITAL SIGNS:  reviewed  ASSESSMENT & PLAN:    Atrial fibrillation with rapid ventricular paroxysmal - Discharge from hospital on 07/06/2018- Cardizem 120 mg CD was prescribed.  Likely triggered by gastroenteritis.  Pacemaker previously showed only 1% atrial fibrillation burden. Feels much better. 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 Ladona Ridgel.  CAD post CABG in 2010  - stable.   Chronic diastolic heart failure - Lasix.  Prior creatinine 0.97.  COPD  - inhalers. Encourage smoking cessation  Elevated troponin -Evidence of mild demand ischemia in the setting of her gastroenteritis and atrial fibrillation.  Essential hypertension - Has been monitored closely by Dr. Abigail Miyamoto.  Anxiety also playing a role.  Secondary pulmonary hypertension - Peak pressures 59 mmHg.  Moderate.  Secondary to obesity, underlying smoking lung disease.  Hyperlipidemia  - atorvastin. Doing well.   Constipation  - took laxitive. Had huge BM. Did not solve the problem.   Back pain  -  thought it may be kidney stone. Will discuss with Dr. Abigail Miyamoto.   COVID-19 Education: The signs and symptoms of COVID-19 were discussed with the patient and how to seek care for testing (follow up with PCP or arrange E-visit).  The importance  of social distancing was discussed today.  Time:   Today, I have spent 11 minutes with the patient with telehealth technology discussing the above problems.     Medication Adjustments/Labs and Tests Ordered: Current medicines are reviewed at length with the patient today.  Concerns regarding medicines are outlined above.   Tests Ordered: No orders of the defined types were placed in this encounter.   Medication Changes: No orders of the defined types were placed in this encounter.   Disposition:  Follow up in 6 month(s)  Signed, Donato Schultz, MD  07/26/2018 5:26 PM    [facility]

## 2018-07-26 NOTE — ED Triage Notes (Signed)
EMS reports from home, Pt c/o abdominal pain from right lower quadrant around to right flank and back for 1 week. Pt states she has not eaten in two days. Denies nausea or vomiting. Pt states she took Tylenol at 1600 with relief.  BP 116/79 HR 74 RR 20 Sp02 95 RA

## 2018-07-26 NOTE — Patient Instructions (Signed)
Medication Instructions:  The current medical regimen is effective;  continue present plan and medications.  If you need a refill on your cardiac medications before your next appointment, please call your pharmacy.   Follow-Up: At [facility], you and your health needs are our priority.  As part of our continuing mission to provide you with exceptional heart care, we have created designated Provider Care Teams.  These Care Teams include your primary Cardiologist (physician) and Advanced Practice Providers (APPs -  Physician Assistants and Nurse Practitioners) who all work together to provide you with the care you need, when you need it. You will need a follow up appointment in 6 months.  Please call our office 2 months in advance to schedule this appointment.  You may see Mark Skains, MD or one of the following Advanced Practice Providers on your designated Care Team:   Lori Gerhardt, NP Laura Ingold, NP . Jill McDaniel, NP  Thank you for choosing [facility]!!       

## 2018-07-26 NOTE — Discharge Instructions (Signed)
Your back pain is most likely due to a muscular strain.  There is been a lot of research on back pain, unfortunately the only thing that seems to really help is Tylenol and ibuprofen.  Relative rest is also important to not lift greater than 10 pounds bending or twisting at the waist.  Please follow-up with your family physician.  The other thing that really seems to benefit patients is physical therapy which your doctor may send you for.  Please return to the emergency department for new numbness or weakness to your arms or legs. Difficulty with urinating or urinating or pooping on yourself.  Also if you cannot feel toilet paper when you wipe or get a fever.   Take tylenol 1000mg(2 extra strength) four times a day. Use the gel as prescribed.     

## 2018-07-26 NOTE — ED Provider Notes (Signed)
[facility]-EMERGENCY DEPT Provider Note   CSN: [id number] Arrival date & time: 07/26/18  1637    History   Chief Complaint Chief Complaint  Patient presents with  . Back Pain  . Flank Pain  . Abdominal Pain    HPI Gabrielle [name] is a 73 y.o. female.     73 yo F with a chief complaint of right flank pain.  Pain is sharp and shooting radiates from her back down into her lower abdomen.  Worse with movement palpation twisting.  Denies fevers denies nausea vomiting or diarrhea.  She thought she was constipated and so took a laxative and had a large bowel movement but did not improve her pain.  She called her family doctor who told her she should come to the ED immediately to be evaluated.  She denies loss of bowel or bladder denies loss perirectal sensation denies recent instrumentation to her back.  She denies numbness or weakness to the leg.  The history is provided by the patient.  Back Pain  Location:  Lumbar spine Quality:  Aching, shooting and stabbing Radiates to: RLQ. Pain severity:  Severe Pain is:  Same all the time Onset quality:  Gradual Duration:  2 days Timing:  Constant Progression:  Worsening Chronicity:  New Relieved by:  Nothing Worsened by:  Movement and palpation Ineffective treatments:  None tried Associated symptoms: abdominal pain   Associated symptoms: no chest pain, no dysuria, no fever and no headaches   Risk factors: no hx of cancer   Flank Pain  Associated symptoms include abdominal pain. Pertinent negatives include no chest pain, no headaches and no shortness of breath.  Abdominal Pain  Associated symptoms: no chest pain, no chills, no dysuria, no fever, no nausea, no shortness of breath and no vomiting     Past Medical History:  Diagnosis Date  . Anemia   . Arthritis   . Asthma   . Atrial fibrillation with controlled ventricular response (HCC) 11/2009   a. not on anticoagulation due to recurrent GI bleeding while  on Eliquis.  . Chronic bronchitis   . Chronic diastolic CHF (congestive heart failure) (HCC)    a. Echo 12/2015: EF 55-60% w/ Grade 3 DD  . Chronic headache   . COPD (chronic obstructive pulmonary disease) (HCC)   . Depression   . Heart attack (HCC)   . Hyperlipidemia   . Hypertension     Patient Active Problem List   Diagnosis Date Noted  . Urinary tract infection without hematuria   . Atrial fibrillation with RVR (HCC) 07/02/2018  . Nausea and vomiting 07/02/2018  . Nausea and/or vomiting 07/01/2018  . Acute lower UTI 07/01/2018  . Compression fracture of T12 vertebra (HCC)   . Hypoxia   . Acute on chronic diastolic CHF (congestive heart failure) /EF 55 % 08/19/2016  . CHF exacerbation (HCC) 08/19/2016  . Chronic diastolic CHF (congestive heart failure) (HCC)   . Hypoxemia   . Physical deconditioning   . Dementia with behavioral disturbance (HCC)   . H/O: GI bleed   . Protein-calorie malnutrition, severe 05/15/2016  . Altered mental status   . Acute renal failure (ARF) (HCC)   . Encounter for central line care   . Hypokalemia 05/11/2016  . Thrombocytopenia (HCC) 12/25/2015  . Acute congestive heart failure (HCC)   . SOB (shortness of breath)   . Dysphagia 12/24/2015  . Tachy-brady syndrome (HCC) 12/24/2015  . Chronic anticoagulation   . Fall   .  Prolonged Q-T interval on ECG   . Hypoxic Respiratory failure, acute (HCC) 12/16/2015  . Anemia due to blood loss, acute 10/27/2015  . Elevated lactic acid level 10/27/2015  . Arterial hypotension   . PAF (paroxysmal atrial fibrillation) (HCC) 10/21/2015  . Severe anemia   . Symptomatic anemia   . Coronary artery disease due to lipid rich plaque 02/07/2014  . Obesity 02/07/2014  . Abdominal aortic ectasia (HCC) 09/06/2013  . Encounter for therapeutic drug monitoring 07/13/2013  . Atrial fibrillation (HCC) 12/20/2012  . Atrial fibrillation with controlled ventricular response (HCC) 11/30/2009  . NICOTINE ADDICTION  04/02/2009  . PARAPSORIASIS 12/15/2008  . Hyperlipidemia 12/14/2008  . HEART ATTACK 12/14/2008  . HEADACHE, CHRONIC 12/14/2008  . Depression 12/13/2008  . 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 COPD exacerbation (HCC) 12/13/2008  . ARTHRITIS 12/13/2008    Past Surgical History:  Procedure Laterality Date  . CAD-CABG     x7, brief post-op Atrial Fib  . CESAREAN SECTION     x2  . COLONOSCOPY WITH PROPOFOL N/A 10/24/2015   Procedure: COLONOSCOPY WITH PROPOFOL;  Surgeon: Carman Ching, MD;  Location: [facility] ENDOSCOPY;  Service: Endoscopy;  Laterality: N/A;  . CORONARY ARTERY BYPASS GRAFT  2011  . EP IMPLANTABLE DEVICE N/A 12/26/2015   Procedure: Pacemaker Implant;  Surgeon: Marinus Maw, MD;  Location: [facility] INVASIVE CV LAB;  Service: Cardiovascular;  Laterality: N/A;  . ESOPHAGOGASTRODUODENOSCOPY N/A 10/22/2015   Procedure: ESOPHAGOGASTRODUODENOSCOPY (EGD);  Surgeon: Carman Ching, MD;  Location: [facility] ENDOSCOPY;  Service: Endoscopy;  Laterality: N/A;  . ESOPHAGOGASTRODUODENOSCOPY N/A 12/24/2015   Procedure: ESOPHAGOGASTRODUODENOSCOPY (EGD);  Surgeon: Carman Ching, MD;  Location: [facility] ENDOSCOPY;  Service: Endoscopy;  Laterality: N/A;  . JOINT REPLACEMENT Bilateral   . TONSILLECTOMY    . TOTAL KNEE ARTHROPLASTY       OB History   No obstetric history on file.      Home Medications    Prior to Admission medications   Medication Sig Start Date End Date Taking? Authorizing Provider  Acetaminophen (TYLENOL 8 HOUR ARTHRITIS PAIN PO) Take 2 tablets by mouth 2 (two) times daily. Back pain    [provider]  albuterol (PROVENTIL HFA;VENTOLIN HFA) 108 (90 Base) MCG/ACT inhaler Inhale 2 puffs into the lungs every 6 (six) hours as needed for wheezing or shortness of breath.    [provider]  ALPRAZolam Prudy Feeler) 1 MG tablet Take 1 mg by mouth at bedtime. 06/26/18   [provider]  atorvastatin (LIPITOR) 20 MG tablet Take 1  tablet (20 mg total) by mouth daily. 07/08/18   Jake Bathe, MD  budesonide-formoterol (SYMBICORT) 160-4.5 MCG/ACT inhaler Inhale 2 puffs into the lungs 2 (two) times daily. 01/15/14   Storm Frisk, MD  citalopram (CELEXA) 20 MG tablet Take 20 mg by mouth daily. 07/08/16   [provider]  clobetasol cream (TEMOVATE) 0.05 % Apply 1 application topically 2 (two) times daily. Affected skin 05/21/16   Vassie Loll, MD  diclofenac sodium (VOLTAREN) 1 % GEL Apply 4 g topically 4 (four) times daily. 07/26/18   Melene Plan, DO  diltiazem (CARDIZEM CD) 120 MG 24 hr capsule Take 1 capsule (120 mg total) by mouth daily. 07/08/18   Jake Bathe, MD  docusate sodium (COLACE) 100 MG capsule Take 100 mg by mouth 2 (two) times daily.     [provider]  fluticasone (FLONASE) 50 MCG/ACT nasal spray Place 2 sprays into both nostrils daily. 12/21/15  Albertine Grates, MD  furosemide (LASIX) 20 MG tablet Take 1 tablet (20 mg total) by mouth daily. 07/08/18   Jake Bathe, MD  metoprolol tartrate (LOPRESSOR) 50 MG tablet Take 1 tablet (50 mg total) by mouth at bedtime. May take an additional 50 mg (one tablet) as needed for palpitations. 07/08/18   Jake Bathe, MD  potassium chloride SA (K-DUR) 20 MEQ tablet Take 1 tablet (20 mEq total) by mouth daily. 07/08/18   Jake Bathe, MD  tiZANidine (ZANAFLEX) 4 MG tablet Take 4 mg by mouth at bedtime as needed for muscle spasms. 07/12/16   [provider]    Family History Family History  Problem Relation Age of Onset  . Diabetes Mother   . Hypertension Mother   . Heart disease Mother        before age 78  . Heart disease Father        before age 64  . Hypertension Father   . Heart attack Father   . Lung cancer Maternal Grandfather   . Lung cancer Paternal Grandfather   . Hypertension Daughter   . Stroke Paternal Grandmother     Social History Social History   Tobacco Use  . Smoking status: Current Some Day Smoker    Packs/day: 0.20     Types: Cigarettes  . Smokeless tobacco: Never Used  . Tobacco comment: "Occasionally I will smoke a cigarette"   Substance Use Topics  . Alcohol use: No    Alcohol/week: 0.0 standard drinks  . Drug use: No     Allergies   Novocain [procaine] and Penicillins   Review of Systems Review of Systems  Constitutional: Negative for chills and fever.  HENT: Negative for congestion and rhinorrhea.   Eyes: Negative for redness and visual disturbance.  Respiratory: Negative for shortness of breath and wheezing.   Cardiovascular: Negative for chest pain and palpitations.  Gastrointestinal: Positive for abdominal pain. Negative for nausea and vomiting.  Genitourinary: Positive for flank pain. Negative for dysuria and urgency.  Musculoskeletal: Positive for back pain. Negative for arthralgias and myalgias.  Skin: Negative for pallor and wound.  Neurological: Negative for dizziness and headaches.     Physical Exam Updated Vital Signs BP 130/69   Pulse 72   Temp 98.4 F (36.9 C) (Oral)   Resp 16   LMP  (LMP Unknown)   SpO2 96%   Physical Exam Vitals signs and nursing note reviewed.  Constitutional:      General: She is not in acute distress.    Appearance: She is well-developed. She is not diaphoretic.  HENT:     Head: Normocephalic and atraumatic.  Eyes:     Pupils: Pupils are equal, round, and reactive to light.  Neck:     Musculoskeletal: Normal range of motion and neck supple.  Cardiovascular:     Rate and Rhythm: Normal rate and regular rhythm.     Heart sounds: No murmur. No friction rub. No gallop.   Pulmonary:     Effort: Pulmonary effort is normal.     Breath sounds: No wheezing or rales.  Abdominal:     General: There is no distension.     Palpations: Abdomen is soft.     Tenderness: There is no abdominal tenderness.  Musculoskeletal:        General: No tenderness.     Comments: Some mild right SI joint tenderness.  Pulse motor and sensation are intact  distally.  Reflexes are 2+ and equal.  There is no clonus.  Skin:    General: Skin is warm and dry.  Neurological:     Mental Status: She is alert and oriented to person, place, and time.  Psychiatric:        Behavior: Behavior normal.      ED Treatments / Results  Labs (all labs ordered are listed, but only abnormal results are displayed) Labs Reviewed - No data to display  EKG None  Radiology Ct L-spine No Charge  Result Date: 07/26/2018 CLINICAL DATA:  Low back pain for 1 week EXAM: CT LUMBAR SPINE WITHOUT CONTRAST TECHNIQUE: Multidetector CT imaging of the lumbar spine was performed without intravenous contrast administration. Multiplanar CT image reconstructions were also generated. COMPARISON:  Chest radiograph 08/19/2016 FINDINGS: Segmentation: Normal Alignment: Grade 1 anterolisthesis at L4-5 Vertebrae: Old compression fracture of T12 with 90% central height loss and 8 mm of retropulsion. Superior endplate Schmorl's node at L3. No acute compression fracture. Paraspinal and other soft tissues: Calcific aortic atherosclerosis. Disc levels: T11-12: Retropulsion of the T12 vertebral body contributes to mild spinal canal stenosis. L4-5: Severe facet hypertrophy with mild right and moderate left neural foraminal stenosis. No spinal canal stenosis. The other visualized disc spaces are unremarkable. IMPRESSION: 1. No acute abnormality of the lumbar spine. 2. Old T12 compression fracture with approximately 90% height loss and 8 mm retropulsion, contributing to mild spinal canal stenosis. 3. L4-5 severe facet arthrosis with mild right and moderate left neural foraminal stenosis. 4.  Aortic atherosclerosis (ICD10-I70.0). Electronically Signed   By: Deatra Robinson M.D.   On: 07/26/2018 17:42   Ct Renal Stone Study  Result Date: 07/26/2018 CLINICAL DATA:  Low back pain for 1 week.  Flank pain. EXAM: CT ABDOMEN AND PELVIS WITHOUT CONTRAST TECHNIQUE: Multidetector CT imaging of the abdomen and  pelvis was performed following the standard protocol without IV contrast. COMPARISON:  12/01/2010 CT scan FINDINGS: Lower chest: Chronic left lower lobe scarring, with scarring or subsegmental atelectasis in the right lower lobe and lingula. Pacer leads noted. Mild cardiomegaly. Dense calcification of the mitral valve. Left anterior descending and right coronary artery atherosclerosis. Hepatobiliary: Nodularity of the hepatic contour favoring cirrhosis. Pancreas: Unremarkable Spleen: The spleen measures 10.5 by 6.5 by 12.0 cm (volume = 430 cm^3). Adrenals/Urinary Tract: 3 mm right kidney upper pole and 3 mm right mid kidney nonobstructive renal calculus. 9 mm left kidney upper pole nonobstructive renal calculus. No hydronephrosis or hydroureter. No ureteral or bladder calculus identified. There is scarring in the left kidney.  Adrenal glands normal. Stomach/Bowel: Sigmoid colon diverticulosis without definite active diverticulitis. Normal appendix. Vascular/Lymphatic: Aortoiliac atherosclerotic vascular disease. No pathologic adenopathy. Reproductive: Unremarkable Other: No supplemental non-categorized findings. Musculoskeletal: Inferior endplate compression fracture at T9, probably subacute given the suggestion of paraspinal edema on image 1/2, with associated sclerosis. 90% compression fracture at T12 with associated sclerosis in the vertebral body, 8 mm posterior bony retropulsion. 25% superior endplate compression fracture at L3, most likely chronic, and roughly similar to 05/11/2016. Grade 1 degenerative anterolisthesis at L4-5. IMPRESSION: 1. Probably subacute inferior endplate compression fracture at T9, partially imaged on today's exam. Small amount of paraspinal edema at this level. 2. 90% compression fracture at T12 with some sclerosis in the vertebral body and 8 mm posterior bony retropulsion. This is likely late subacute or possibly chronic. 3. 20% superior endplate compression fracture at L3, likely  chronic. 4. Hepatic cirrhosis with borderline splenomegaly. 5. Bilateral nonobstructive nephrolithiasis. 6. Other imaging findings of potential clinical significance: Bibasilar scarring.  Mild cardiomegaly. Mitral valve calcification. Aortic Atherosclerosis (ICD10-I70.0). Coronary atherosclerosis. Sigmoid colon diverticulosis. Electronically Signed   By: Gaylyn Rong M.D.   On: 07/26/2018 17:49    Procedures Procedures (including critical care time)  Medications Ordered in ED Medications  diclofenac sodium (VOLTAREN) 1 % transdermal gel 4 g (4 g Topical Given 07/26/18 1817)     Initial Impression / Assessment and Plan / ED Course  I have reviewed the triage vital signs and the nursing notes.  Pertinent labs & imaging results that were available during my care of the patient were reviewed by me and considered in my medical decision making (see chart for details).        73 yo F with a chief complaint of right lower back pain that radiates to the abdomen.  Sounds like the patient is having mechanical back pain.  She has no history of the same.  No red flags other than age.  Will obtain an imaging study.  CT with old appearing fractures.  Pain not in midline.  D/c home.  PCP follow up.   10:59 PM:  I have discussed the diagnosis/risks/treatment options with the patient and believe the pt to be eligible for discharge home to follow-up with PCP. We also discussed returning to the ED immediately if new or worsening sx occur. We discussed the sx which are most concerning (e.g., sudden worsening pain, fever, inability to tolerate by mouth) that necessitate immediate return. Medications administered to the patient during their visit and any new prescriptions provided to the patient are listed below.  Medications given during this visit Medications  diclofenac sodium (VOLTAREN) 1 % transdermal gel 4 g (4 g Topical Given 07/26/18 1817)     The patient appears reasonably screen and/or  stabilized for discharge and I doubt any other medical condition or other [facility] requiring further screening, evaluation, or treatment in the ED at this time prior to discharge.    Final Clinical Impressions(s) / ED Diagnoses   Final diagnoses:  Radiculopathy of thoracolumbar region    ED Discharge Orders         Ordered    diclofenac sodium (VOLTAREN) 1 % GEL  4 times daily     07/26/18 1802           Melene Plan, DO 07/26/18 2259

## 2018-07-26 NOTE — Telephone Encounter (Signed)
Pt had virtual appt today with Dr Anne Fu.

## 2018-07-26 NOTE — ED Notes (Signed)
Bed: LX72 Expected date:  Expected time:  Means of arrival:  Comments: EMS 73 yo hip pain

## 2018-08-08 ENCOUNTER — Ambulatory Visit (INDEPENDENT_AMBULATORY_CARE_PROVIDER_SITE_OTHER): Payer: Medicare Other | Admitting: *Deleted

## 2018-08-08 DIAGNOSIS — I495 Sick sinus syndrome: Secondary | ICD-10-CM

## 2018-08-08 DIAGNOSIS — I5032 Chronic diastolic (congestive) heart failure: Secondary | ICD-10-CM

## 2018-08-08 LAB — CUP PACEART REMOTE DEVICE CHECK
Battery Remaining Longevity: 156 mo
Battery Remaining Percentage: 100 %
Brady Statistic RA Percent Paced: 59 %
Brady Statistic RV Percent Paced: 1 %
Date Time Interrogation Session: 20200608045000
Implantable Lead Implant Date: 20171026
Implantable Lead Implant Date: 20171026
Implantable Lead Location: 753859
Implantable Lead Location: 753860
Implantable Lead Model: 7740
Implantable Lead Model: 7741
Implantable Lead Serial Number: 685941
Implantable Lead Serial Number: 818382
Implantable Pulse Generator Implant Date: 20171026
Lead Channel Impedance Value: 1280 Ohm
Lead Channel Impedance Value: 711 Ohm
Lead Channel Pacing Threshold Amplitude: 0.7 V
Lead Channel Pacing Threshold Amplitude: 1.5 V
Lead Channel Pacing Threshold Pulse Width: 0.4 ms
Lead Channel Pacing Threshold Pulse Width: 0.4 ms
Lead Channel Setting Pacing Amplitude: 2 V
Lead Channel Setting Pacing Amplitude: 2.4 V
Lead Channel Setting Pacing Pulse Width: 0.4 ms
Lead Channel Setting Sensing Sensitivity: 2.5 mV
Pulse Gen Serial Number: 766992

## 2018-08-15 NOTE — Progress Notes (Signed)
Remote pacemaker transmission.   

## 2018-08-26 ENCOUNTER — Telehealth: Payer: Self-pay | Admitting: Internal Medicine

## 2018-08-26 NOTE — Telephone Encounter (Signed)
Cardiology [facility] Note  Returned page from patient. Stating her HR is normally 60 but tonight it is 74. BP is 128/57. She is feeling fine. I reassured her that these are normal numbers and there is no reason to continue checking tonight.   Marcie Mowers, MD Cardiology Fellow, PGY-6

## 2018-10-01 IMAGING — CR DG CHEST 1V
2 series · 2 of 2 positions shown · non-contrast
Comparison: 12/27/2015

CLINICAL DATA: Dementia/confusion with history of fall

EXAM:
CHEST 1 VIEW

[t chest supine (1 of 2)]
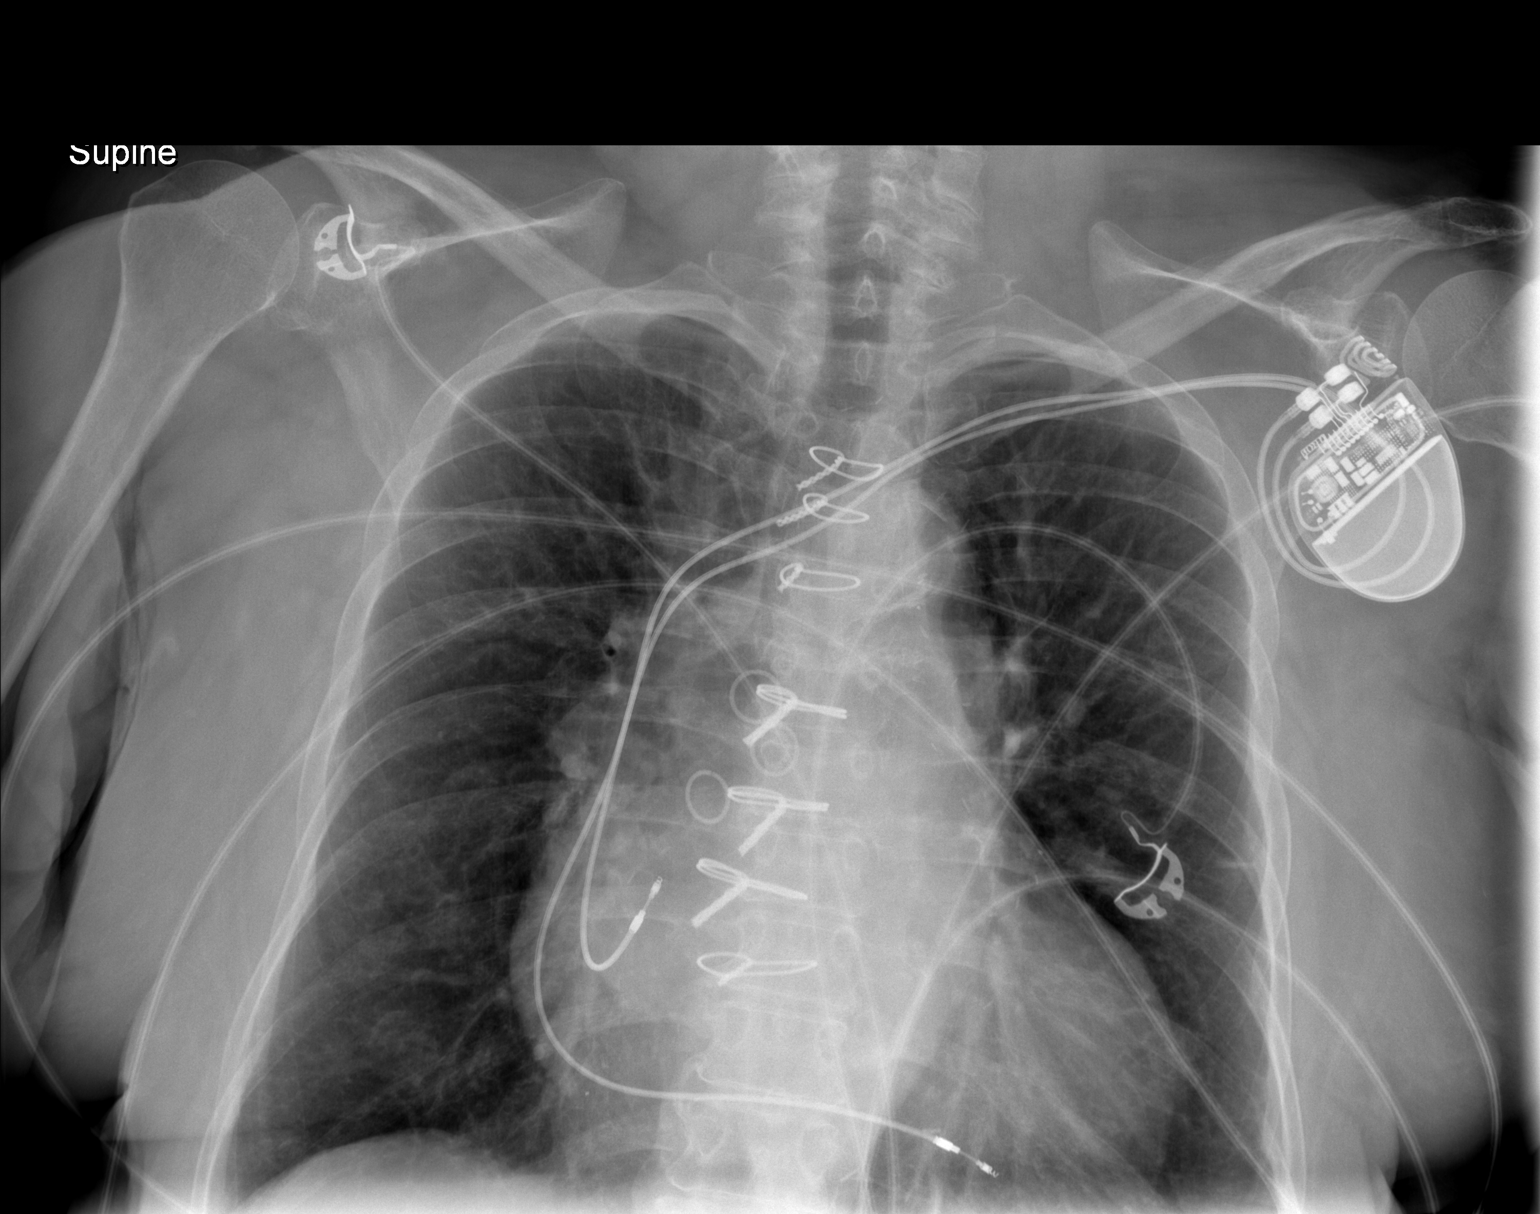

[t chest supine (2 of 2)]
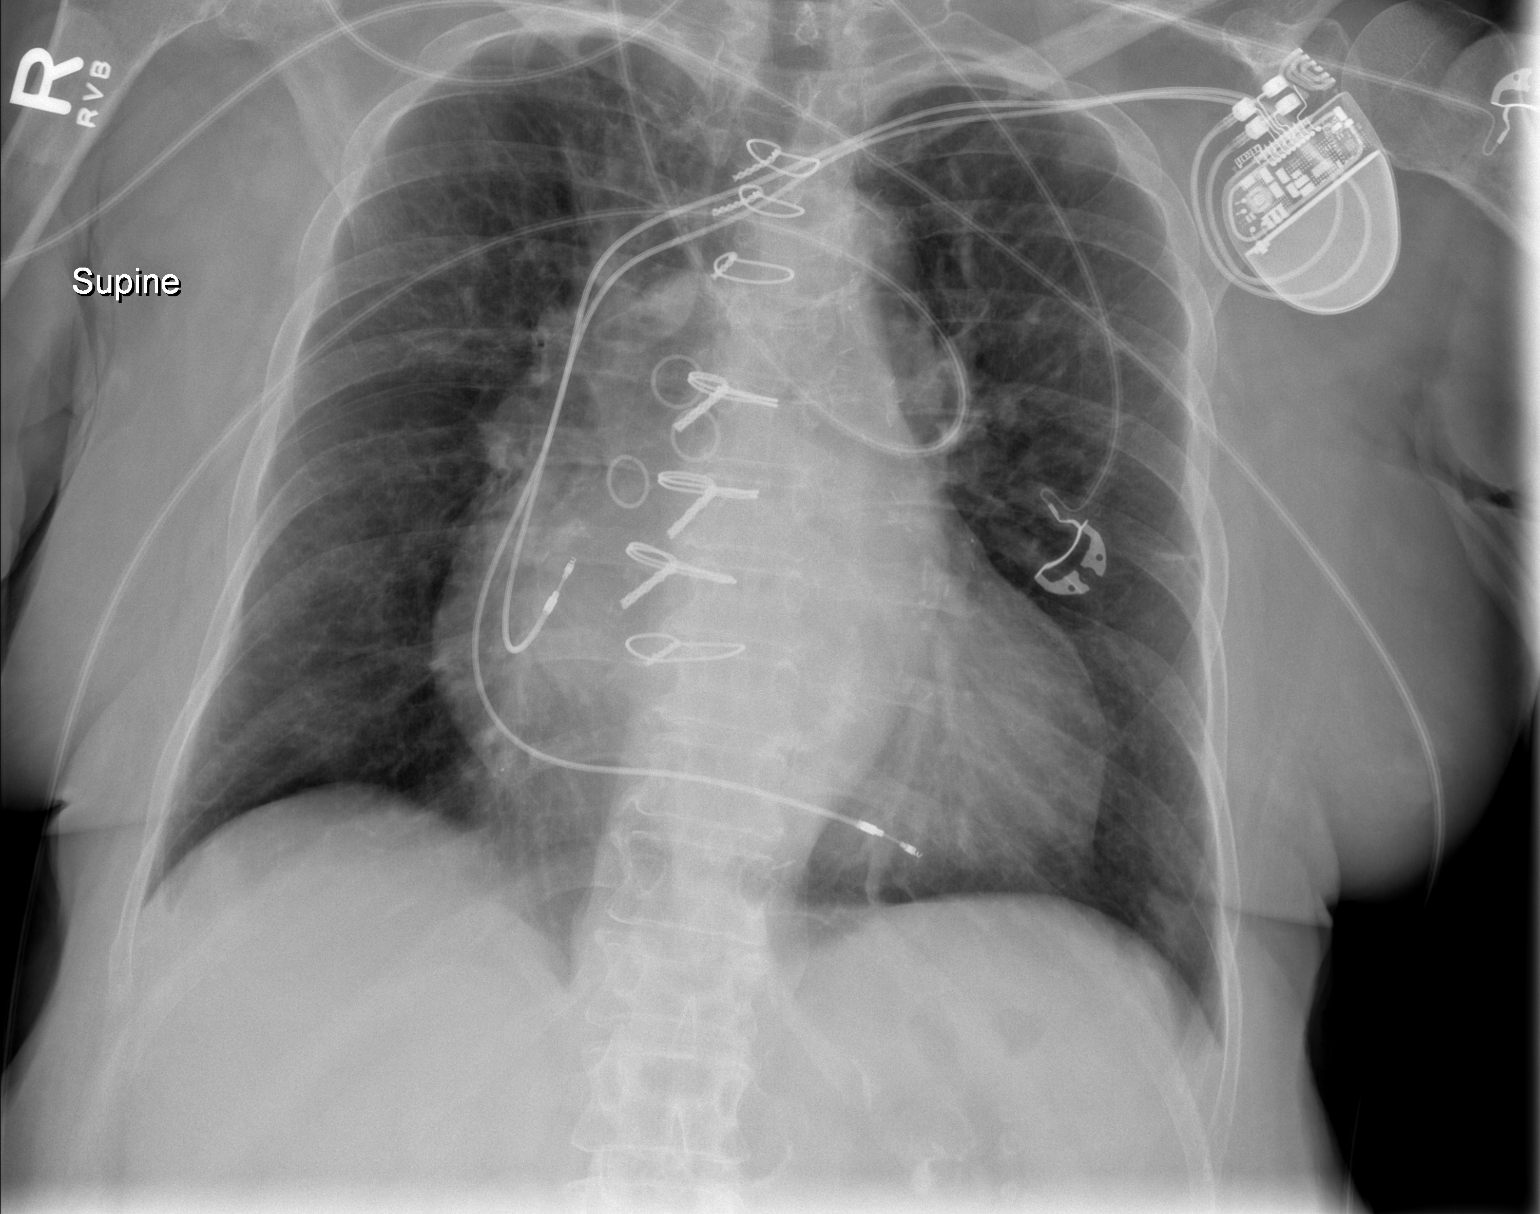

[2 of 2 positions shown; findings below may reference images not displayed]

FINDINGS: Post sternotomy changes are again evident. There is a left-sided duo
lead pacing device with leads over the right atrium and right
ventricle.

The lungs are slightly hyperinflated. There is no acute infiltrate
or effusion. No pneumothorax. Stable cardiomegaly with central
vascular congestion. Aortic atherosclerosis.
IMPRESSION: 1. Cardiomegaly with mild central congestion.  No overt edema
2. No acute infiltrate

## 2018-10-03 IMAGING — DX DG CHEST 1V PORT
1 series · 1 of 1 positions shown · non-contrast
Comparison: 05/13/2016 .

CLINICAL DATA: Central line placement.

EXAM:
PORTABLE CHEST 1 VIEW

[chest ap]
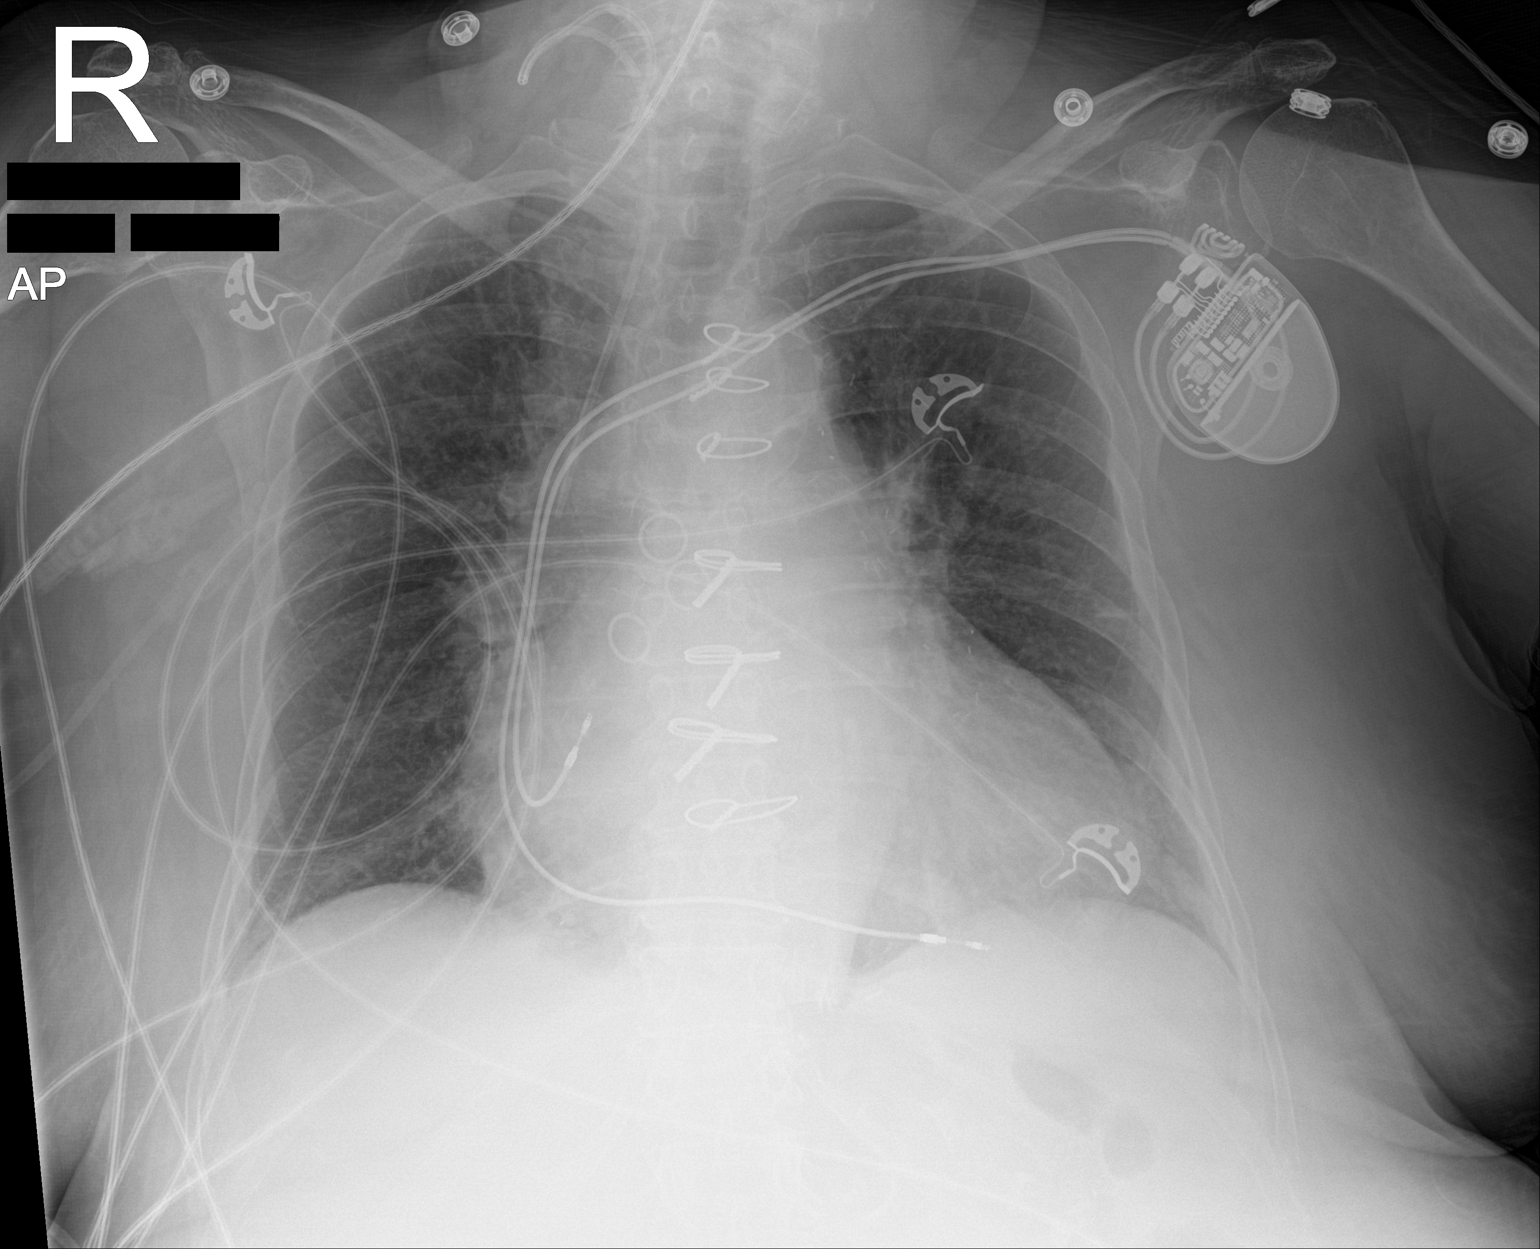

[1 of 1 positions shown; findings below may reference images not displayed]

FINDINGS: Right IJ line noted with tip over the superior vena cava.
Mediastinum is stable. Cardiac pacer stable position. Prior CABG.
Stable cardiomegaly. No focal infiltrate. No pleural effusion or
pneumothorax .
IMPRESSION: 1. Interim placement of right IJ line, its tip is projected over
superior vena cava. No pneumothorax.

2. Cardiac pacer stable position. Prior CABG. Stable cardiomegaly.

## 2018-10-03 IMAGING — DX DG CHEST 1V PORT
1 series · 2 of 2 positions shown · non-contrast
Comparison: 05/11/2016.

CLINICAL DATA: Hypoxia over night, episode of supraventricular
tachycardia. Hypotension.

EXAM:
PORTABLE CHEST 1 VIEW

[Series 1: chest ap · 0.14mm/px · 2 of 2 slices shown]
[im 1/2]
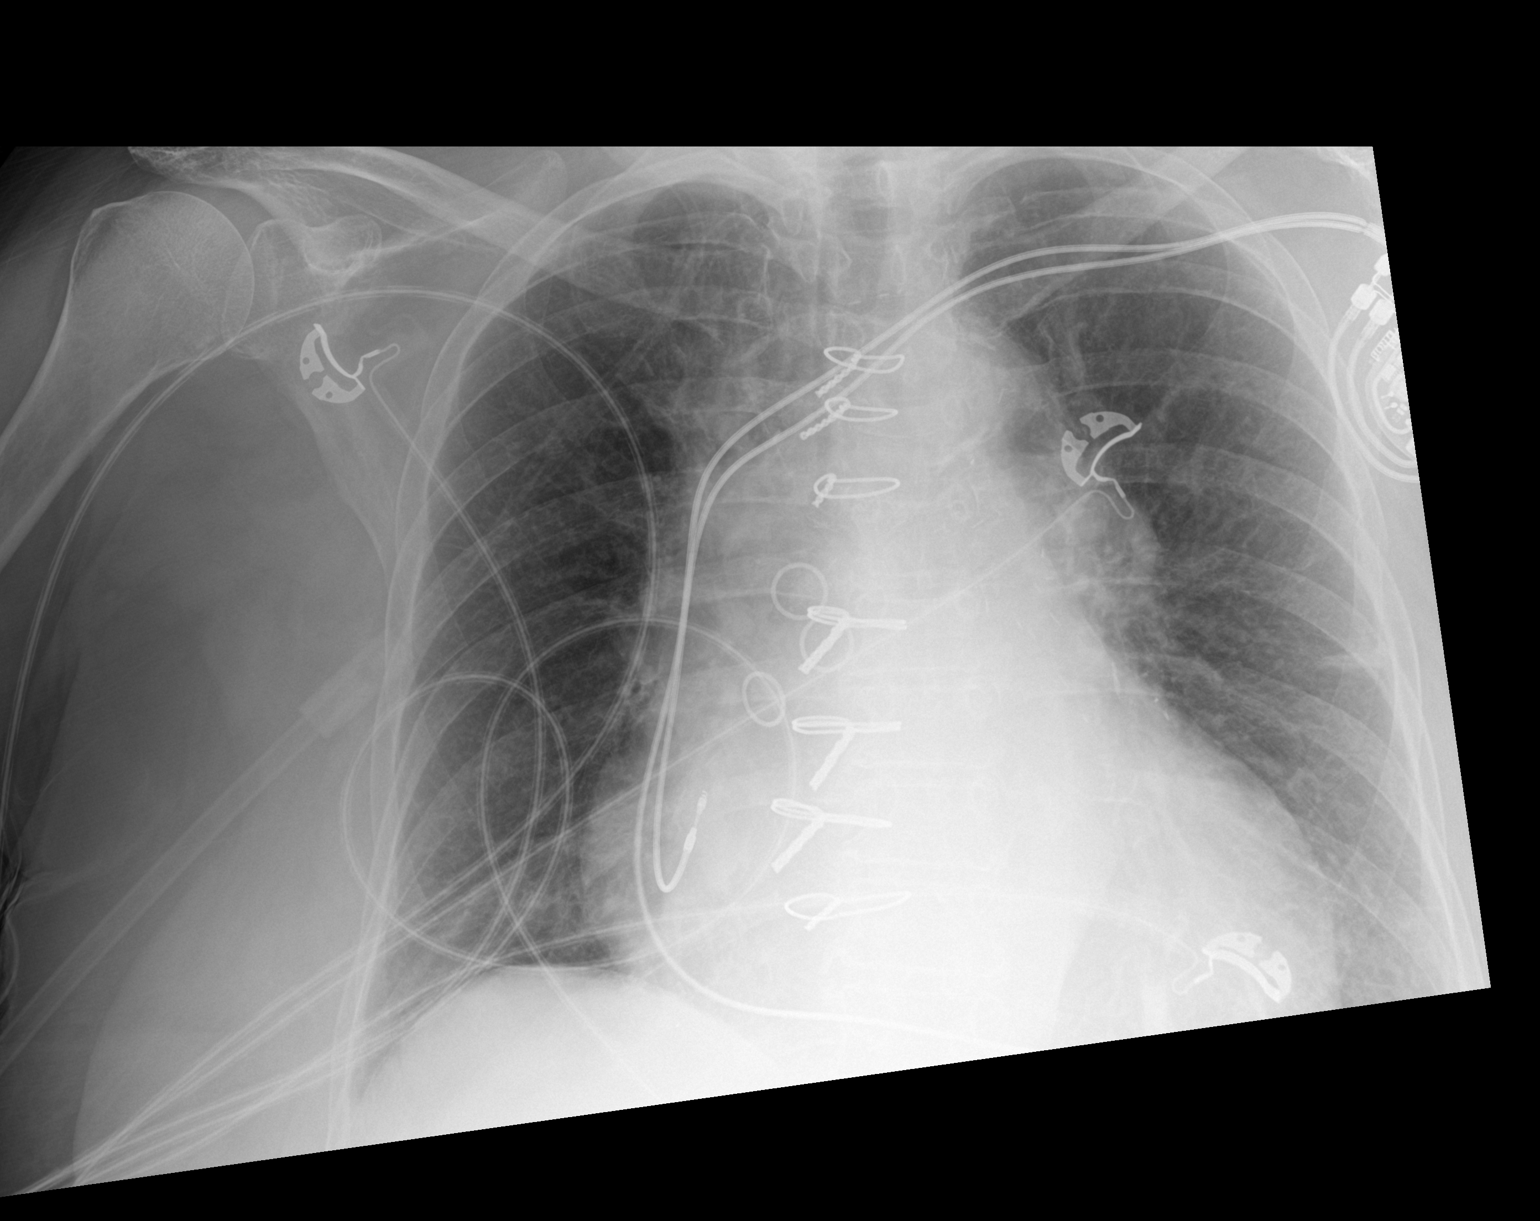
[im 2/2]
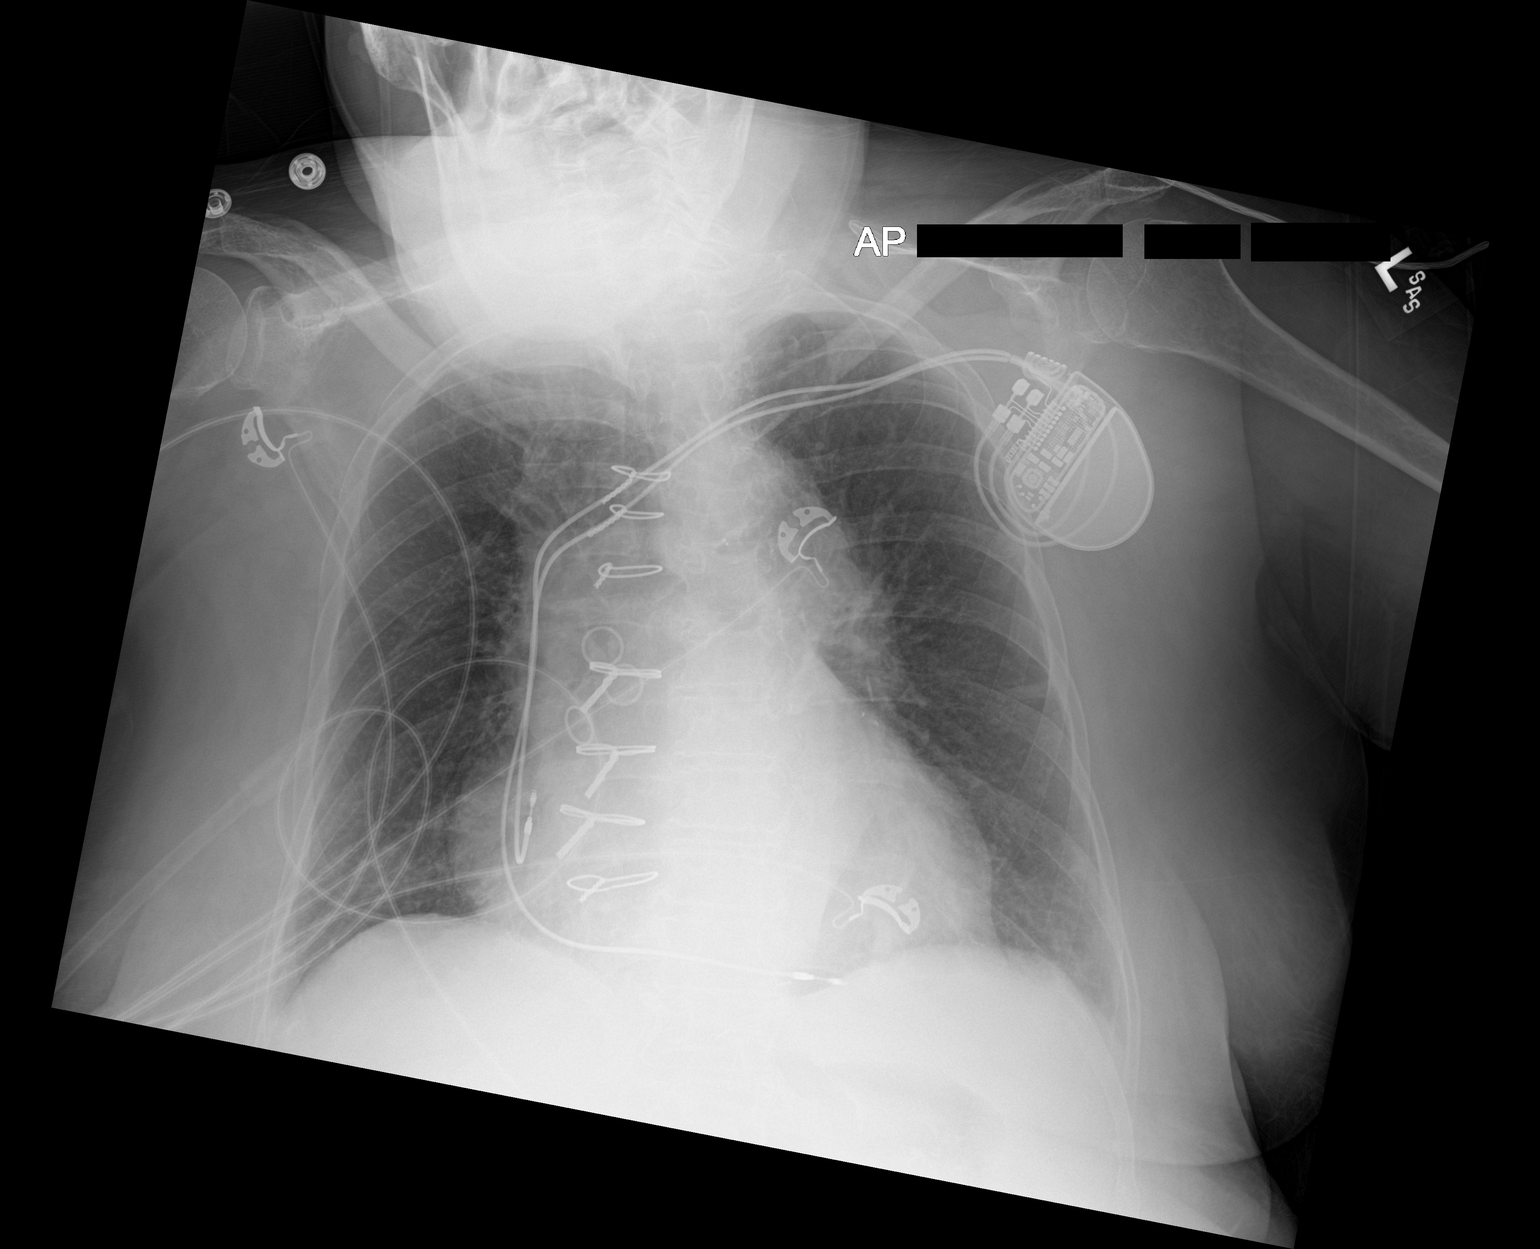

[2 of 2 positions shown; findings below may reference images not displayed]

FINDINGS: Trachea is midline. Heart is enlarged. Pacemaker lead tips project
over the right atrium and right ventricle. Minimal linear
atelectasis or scarring in the lingula. Lungs are otherwise clear.
No pleural fluid.
IMPRESSION: No acute findings.

## 2018-11-08 ENCOUNTER — Ambulatory Visit (INDEPENDENT_AMBULATORY_CARE_PROVIDER_SITE_OTHER): Payer: Medicare Other | Admitting: *Deleted

## 2018-11-08 DIAGNOSIS — I495 Sick sinus syndrome: Secondary | ICD-10-CM

## 2018-11-09 LAB — CUP PACEART REMOTE DEVICE CHECK
Battery Remaining Longevity: 156 mo
Battery Remaining Percentage: 100 %
Brady Statistic RA Percent Paced: 57 %
Brady Statistic RV Percent Paced: 1 %
Date Time Interrogation Session: 20200908041100
Implantable Lead Implant Date: 20171026
Implantable Lead Implant Date: 20171026
Implantable Lead Location: 753859
Implantable Lead Location: 753860
Implantable Lead Model: 7740
Implantable Lead Model: 7741
Implantable Lead Serial Number: 685941
Implantable Lead Serial Number: 818382
Implantable Pulse Generator Implant Date: 20171026
Lead Channel Impedance Value: 1405 Ohm
Lead Channel Impedance Value: 724 Ohm
Lead Channel Pacing Threshold Amplitude: 0.7 V
Lead Channel Pacing Threshold Amplitude: 1.9 V
Lead Channel Pacing Threshold Pulse Width: 0.4 ms
Lead Channel Pacing Threshold Pulse Width: 0.4 ms
Lead Channel Setting Pacing Amplitude: 2 V
Lead Channel Setting Pacing Amplitude: 2.4 V
Lead Channel Setting Pacing Pulse Width: 0.4 ms
Lead Channel Setting Sensing Sensitivity: 2.5 mV
Pulse Gen Serial Number: 766992

## 2018-11-23 ENCOUNTER — Encounter: Payer: Self-pay | Admitting: Cardiology

## 2018-11-23 NOTE — Progress Notes (Signed)
Remote pacemaker transmission.   

## 2019-01-03 ENCOUNTER — Other Ambulatory Visit: Payer: Self-pay | Admitting: Cardiology

## 2019-01-03 DIAGNOSIS — R002 Palpitations: Secondary | ICD-10-CM

## 2019-01-05 NOTE — Telephone Encounter (Signed)
Pt due for 6 month f/u.  Please contact pt for future appointment. 

## 2019-01-05 NOTE — Telephone Encounter (Signed)
Unable to leave VM to verify if pt is taking protonix 40 mg tablet qd. Medication is not on current medication list.

## 2019-02-02 ENCOUNTER — Other Ambulatory Visit: Payer: Self-pay | Admitting: Cardiology

## 2019-02-07 ENCOUNTER — Ambulatory Visit (INDEPENDENT_AMBULATORY_CARE_PROVIDER_SITE_OTHER): Payer: Medicare Other | Admitting: *Deleted

## 2019-02-07 DIAGNOSIS — I495 Sick sinus syndrome: Secondary | ICD-10-CM | POA: Diagnosis not present

## 2019-02-07 LAB — CUP PACEART REMOTE DEVICE CHECK
Battery Remaining Longevity: 156 mo
Battery Remaining Percentage: 100 %
Brady Statistic RA Percent Paced: 55 %
Brady Statistic RV Percent Paced: 1 %
Date Time Interrogation Session: 20201208001500
Implantable Lead Implant Date: 20171026
Implantable Lead Implant Date: 20171026
Implantable Lead Location: 753859
Implantable Lead Location: 753860
Implantable Lead Model: 7740
Implantable Lead Model: 7741
Implantable Lead Serial Number: 685941
Implantable Lead Serial Number: 818382
Implantable Pulse Generator Implant Date: 20171026
Lead Channel Impedance Value: 1319 Ohm
Lead Channel Impedance Value: 764 Ohm
Lead Channel Pacing Threshold Amplitude: 0.7 V
Lead Channel Pacing Threshold Amplitude: 2 V
Lead Channel Pacing Threshold Pulse Width: 0.4 ms
Lead Channel Pacing Threshold Pulse Width: 0.4 ms
Lead Channel Setting Pacing Amplitude: 2 V
Lead Channel Setting Pacing Amplitude: 2.4 V
Lead Channel Setting Pacing Pulse Width: 0.4 ms
Lead Channel Setting Sensing Sensitivity: 2.5 mV
Pulse Gen Serial Number: 766992

## 2019-02-08 ENCOUNTER — Other Ambulatory Visit: Payer: Self-pay

## 2019-02-08 DIAGNOSIS — Z20822 Contact with and (suspected) exposure to covid-19: Secondary | ICD-10-CM

## 2019-02-10 ENCOUNTER — Other Ambulatory Visit: Payer: Self-pay

## 2019-02-10 DIAGNOSIS — R002 Palpitations: Secondary | ICD-10-CM

## 2019-02-10 LAB — NOVEL CORONAVIRUS, NAA: SARS-CoV-2, NAA: NOT DETECTED

## 2019-02-10 MED ORDER — METOPROLOL TARTRATE 50 MG PO TABS: ORAL_TABLET | ORAL | 1 refills | Status: DC

## 2019-02-14 ENCOUNTER — Other Ambulatory Visit: Payer: Self-pay | Admitting: Cardiology

## 2019-02-14 DIAGNOSIS — R002 Palpitations: Secondary | ICD-10-CM

## 2019-02-16 ENCOUNTER — Telehealth: Payer: Self-pay

## 2019-02-16 NOTE — Telephone Encounter (Signed)
Caller given negative result from 02/08/2019.

## 2019-02-21 ENCOUNTER — Other Ambulatory Visit: Payer: Self-pay

## 2019-02-21 ENCOUNTER — Encounter (HOSPITAL_COMMUNITY): Payer: Self-pay | Admitting: Emergency Medicine

## 2019-02-21 ENCOUNTER — Inpatient Hospital Stay (HOSPITAL_COMMUNITY)
Admission: EM | Admit: 2019-02-21 | Discharge: 2019-03-01 | DRG: 291 | Disposition: A | Discharge status: Home / Self Care | Payer: Medicare Other | Attending: Internal Medicine | Admitting: Internal Medicine

## 2019-02-21 ENCOUNTER — Emergency Department (HOSPITAL_COMMUNITY): Payer: Medicare Other

## 2019-02-21 DIAGNOSIS — R6510 Systemic inflammatory response syndrome (SIRS) of non-infectious origin without acute organ dysfunction: Secondary | ICD-10-CM | POA: Diagnosis present

## 2019-02-21 DIAGNOSIS — I252 Old myocardial infarction: Secondary | ICD-10-CM

## 2019-02-21 DIAGNOSIS — J9601 Acute respiratory failure with hypoxia: Secondary | ICD-10-CM | POA: Diagnosis not present

## 2019-02-21 DIAGNOSIS — N179 Acute kidney failure, unspecified: Secondary | ICD-10-CM | POA: Diagnosis present

## 2019-02-21 DIAGNOSIS — K219 Gastro-esophageal reflux disease without esophagitis: Secondary | ICD-10-CM | POA: Diagnosis present

## 2019-02-21 DIAGNOSIS — E785 Hyperlipidemia, unspecified: Secondary | ICD-10-CM | POA: Diagnosis present

## 2019-02-21 DIAGNOSIS — R443 Hallucinations, unspecified: Secondary | ICD-10-CM | POA: Diagnosis present

## 2019-02-21 DIAGNOSIS — Z6836 Body mass index (BMI) 36.0-36.9, adult: Secondary | ICD-10-CM

## 2019-02-21 DIAGNOSIS — J440 Chronic obstructive pulmonary disease with acute lower respiratory infection: Secondary | ICD-10-CM | POA: Diagnosis present

## 2019-02-21 DIAGNOSIS — I4891 Unspecified atrial fibrillation: Secondary | ICD-10-CM | POA: Diagnosis not present

## 2019-02-21 DIAGNOSIS — J9600 Acute respiratory failure, unspecified whether with hypoxia or hypercapnia: Secondary | ICD-10-CM | POA: Diagnosis present

## 2019-02-21 DIAGNOSIS — Z20828 Contact with and (suspected) exposure to other viral communicable diseases: Secondary | ICD-10-CM | POA: Diagnosis present

## 2019-02-21 DIAGNOSIS — I480 Paroxysmal atrial fibrillation: Secondary | ICD-10-CM | POA: Diagnosis present

## 2019-02-21 DIAGNOSIS — Z801 Family history of malignant neoplasm of trachea, bronchus and lung: Secondary | ICD-10-CM

## 2019-02-21 DIAGNOSIS — F419 Anxiety disorder, unspecified: Secondary | ICD-10-CM | POA: Diagnosis present

## 2019-02-21 DIAGNOSIS — I2510 Atherosclerotic heart disease of native coronary artery without angina pectoris: Secondary | ICD-10-CM | POA: Diagnosis present

## 2019-02-21 DIAGNOSIS — Z8249 Family history of ischemic heart disease and other diseases of the circulatory system: Secondary | ICD-10-CM

## 2019-02-21 DIAGNOSIS — E782 Mixed hyperlipidemia: Secondary | ICD-10-CM

## 2019-02-21 DIAGNOSIS — E876 Hypokalemia: Secondary | ICD-10-CM | POA: Diagnosis present

## 2019-02-21 DIAGNOSIS — Z950 Presence of cardiac pacemaker: Secondary | ICD-10-CM

## 2019-02-21 DIAGNOSIS — E860 Dehydration: Secondary | ICD-10-CM | POA: Diagnosis present

## 2019-02-21 DIAGNOSIS — G9341 Metabolic encephalopathy: Secondary | ICD-10-CM | POA: Diagnosis not present

## 2019-02-21 DIAGNOSIS — F329 Major depressive disorder, single episode, unspecified: Secondary | ICD-10-CM | POA: Diagnosis present

## 2019-02-21 DIAGNOSIS — J441 Chronic obstructive pulmonary disease with (acute) exacerbation: Secondary | ICD-10-CM | POA: Diagnosis present

## 2019-02-21 DIAGNOSIS — F17210 Nicotine dependence, cigarettes, uncomplicated: Secondary | ICD-10-CM | POA: Diagnosis present

## 2019-02-21 DIAGNOSIS — I959 Hypotension, unspecified: Secondary | ICD-10-CM | POA: Diagnosis present

## 2019-02-21 DIAGNOSIS — I5043 Acute on chronic combined systolic (congestive) and diastolic (congestive) heart failure: Secondary | ICD-10-CM | POA: Diagnosis present

## 2019-02-21 DIAGNOSIS — I110 Hypertensive heart disease with heart failure: Principal | ICD-10-CM | POA: Diagnosis present

## 2019-02-21 DIAGNOSIS — R509 Fever, unspecified: Secondary | ICD-10-CM

## 2019-02-21 DIAGNOSIS — I160 Hypertensive urgency: Secondary | ICD-10-CM | POA: Diagnosis present

## 2019-02-21 DIAGNOSIS — E872 Acidosis: Secondary | ICD-10-CM | POA: Diagnosis present

## 2019-02-21 DIAGNOSIS — Z823 Family history of stroke: Secondary | ICD-10-CM

## 2019-02-21 DIAGNOSIS — I495 Sick sinus syndrome: Secondary | ICD-10-CM | POA: Diagnosis present

## 2019-02-21 DIAGNOSIS — T380X5A Adverse effect of glucocorticoids and synthetic analogues, initial encounter: Secondary | ICD-10-CM | POA: Diagnosis present

## 2019-02-21 DIAGNOSIS — Z951 Presence of aortocoronary bypass graft: Secondary | ICD-10-CM

## 2019-02-21 DIAGNOSIS — Z8673 Personal history of transient ischemic attack (TIA), and cerebral infarction without residual deficits: Secondary | ICD-10-CM

## 2019-02-21 DIAGNOSIS — R0902 Hypoxemia: Secondary | ICD-10-CM

## 2019-02-21 DIAGNOSIS — Z833 Family history of diabetes mellitus: Secondary | ICD-10-CM

## 2019-02-21 DIAGNOSIS — R0602 Shortness of breath: Secondary | ICD-10-CM

## 2019-02-21 DIAGNOSIS — J189 Pneumonia, unspecified organism: Secondary | ICD-10-CM | POA: Diagnosis present

## 2019-02-21 DIAGNOSIS — Z79899 Other long term (current) drug therapy: Secondary | ICD-10-CM

## 2019-02-21 HISTORY — DX: Acute respiratory failure, unspecified whether with hypoxia or hypercapnia: J96.00

## 2019-02-21 HISTORY — DX: Acute kidney failure, unspecified: N17.9

## 2019-02-21 LAB — CBC WITH DIFFERENTIAL/PLATELET
Abs Immature Granulocytes: 0.09 10*3/uL — ABNORMAL HIGH (ref 0.00–0.07)
Basophils Absolute: 0 10*3/uL (ref 0.0–0.1)
Basophils Relative: 0 %
Eosinophils Absolute: 0 10*3/uL (ref 0.0–0.5)
Eosinophils Relative: 0 %
HCT: 41.1 % (ref 36.0–46.0)
Hemoglobin: 12.1 g/dL (ref 12.0–15.0)
Immature Granulocytes: 1 %
Lymphocytes Relative: 14 %
Lymphs Abs: 2 10*3/uL (ref 0.7–4.0)
MCH: 26.4 pg (ref 26.0–34.0)
MCHC: 29.4 g/dL — ABNORMAL LOW (ref 30.0–36.0)
MCV: 89.5 fL (ref 80.0–100.0)
Monocytes Absolute: 1 10*3/uL (ref 0.1–1.0)
Monocytes Relative: 7 %
Neutro Abs: 11 10*3/uL — ABNORMAL HIGH (ref 1.7–7.7)
Neutrophils Relative %: 78 %
Platelets: 178 10*3/uL (ref 150–400)
RBC: 4.59 MIL/uL (ref 3.87–5.11)
RDW: 15 % (ref 11.5–15.5)
WBC: 14.1 10*3/uL — ABNORMAL HIGH (ref 4.0–10.5)
nRBC: 0 % (ref 0.0–0.2)

## 2019-02-21 LAB — URINALYSIS, ROUTINE W REFLEX MICROSCOPIC
Bacteria, UA: NONE SEEN
Bilirubin Urine: NEGATIVE
Glucose, UA: NEGATIVE mg/dL
Hgb urine dipstick: NEGATIVE
Ketones, ur: NEGATIVE mg/dL
Leukocytes,Ua: NEGATIVE
Nitrite: NEGATIVE
Protein, ur: 30 mg/dL — AB
Specific Gravity, Urine: 1.018 (ref 1.005–1.030)
pH: 6 (ref 5.0–8.0)

## 2019-02-21 LAB — BASIC METABOLIC PANEL
Anion gap: 10 (ref 5–15)
BUN: 14 mg/dL (ref 8–23)
CO2: 27 mmol/L (ref 22–32)
Calcium: 9.7 mg/dL (ref 8.9–10.3)
Chloride: 103 mmol/L (ref 98–111)
Creatinine, Ser: 1.05 mg/dL — ABNORMAL HIGH (ref 0.44–1.00)
GFR calc Af Amer: >60 mL/min (ref >60)
GFR calc non Af Amer: 52 mL/min — ABNORMAL LOW (ref >60)
Glucose, Bld: 120 mg/dL — ABNORMAL HIGH (ref 70–99)
Potassium: 4.3 mmol/L (ref 3.5–5.1)
Sodium: 140 mmol/L (ref 135–145)

## 2019-02-21 LAB — PROCALCITONIN: Procalcitonin: 0.13 ng/mL

## 2019-02-21 LAB — FERRITIN: Ferritin: 9 ng/mL — ABNORMAL LOW (ref 11–307)

## 2019-02-21 LAB — D-DIMER, QUANTITATIVE: D-Dimer, Quant: 1.43 ug/mL-FEU — ABNORMAL HIGH (ref 0.00–0.50)

## 2019-02-21 LAB — FIBRINOGEN: Fibrinogen: 463 mg/dL (ref 210–475)

## 2019-02-21 LAB — C-REACTIVE PROTEIN: CRP: 1 mg/dL — ABNORMAL HIGH (ref <1.0)

## 2019-02-21 LAB — TRIGLYCERIDES: Triglycerides: 82 mg/dL (ref <150)

## 2019-02-21 LAB — LACTIC ACID, PLASMA
Lactic Acid, Venous: 1.2 mmol/L (ref 0.5–1.9)
Lactic Acid, Venous: 1 mmol/L (ref 0.5–1.9)

## 2019-02-21 LAB — BRAIN NATRIURETIC PEPTIDE: B Natriuretic Peptide: 484.5 pg/mL — ABNORMAL HIGH (ref 0.0–100.0)

## 2019-02-21 MED ORDER — SODIUM CHLORIDE 0.9 % IV BOLUS
1000.0000 mL | Freq: Once | INTRAVENOUS | Status: AC
  Administered 2019-02-21: 20:00:00 1000 mL via INTRAVENOUS

## 2019-02-21 NOTE — ED Notes (Signed)
Pt reports several days of urinary frequency. Said she stopped taking her metoprolol because she was urinating so often.

## 2019-02-21 NOTE — ED Notes (Signed)
[place] [employer] served IVC paperwork to pt.

## 2019-02-21 NOTE — ED Provider Notes (Signed)
[facility]-EMERGENCY DEPT Provider Note   CSN: [id number] Arrival date & time: 02/21/19  1605     History No chief complaint on file.   Gabrielle [name] is a 73 y.o. female.  HPI  73yF with hallucinations. Family called EMS because of patient's behavior. She was having visual hallucinations. She says she has been seeing bugs but has enough insight to realize that she is probably "just seeing things." They thought she may have overdosed. Pt seems pretty sure she didn't. Says she took melatonin last night to help her sleep. Says has been taking medications as prescribed aside from stopping metoprolol a couple days ago because she felt this may be making her urinate more frequently. No dysuria. Febrile for EMS to 101.1. Pt denies cough. No v/d. Says she doesn't have much of an appetite. Having some pain in her back but this is not new/different.   Past Medical History:  Diagnosis Date  . Anemia   . Arthritis   . Asthma   . Atrial fibrillation with controlled ventricular response (HCC) 11/2009   a. not on anticoagulation due to recurrent GI bleeding while on Eliquis.  . Chronic bronchitis   . Chronic diastolic CHF (congestive heart failure) (HCC)    a. Echo 12/2015: EF 55-60% w/ Grade 3 DD  . Chronic headache   . COPD (chronic obstructive pulmonary disease) (HCC)   . Depression   . Heart attack (HCC)   . Hyperlipidemia   . Hypertension     Patient Active Problem List   Diagnosis Date Noted  . Urinary tract infection without hematuria   . Atrial fibrillation with RVR (HCC) 07/02/2018  . Nausea and vomiting 07/02/2018  . Nausea and/or vomiting 07/01/2018  . Acute lower UTI 07/01/2018  . Compression fracture of T12 vertebra (HCC)   . Hypoxia   . Acute on chronic diastolic CHF (congestive heart failure) /EF 55 % 08/19/2016  . CHF exacerbation (HCC) 08/19/2016  . Chronic diastolic CHF (congestive heart failure) (HCC)   . Hypoxemia   . Physical  deconditioning   . Dementia with behavioral disturbance (HCC)   . H/O: GI bleed   . Protein-calorie malnutrition, severe 05/15/2016  . Altered mental status   . Acute renal failure (ARF) (HCC)   . Encounter for central line care   . Hypokalemia 05/11/2016  . Thrombocytopenia (HCC) 12/25/2015  . Acute congestive heart failure (HCC)   . SOB (shortness of breath)   . Dysphagia 12/24/2015  . Tachy-brady syndrome (HCC) 12/24/2015  . Chronic anticoagulation   . Fall   . Prolonged Q-T interval on ECG   . Hypoxic Respiratory failure, acute (HCC) 12/16/2015  . Anemia due to blood loss, acute 10/27/2015  . Elevated lactic acid level 10/27/2015  . Arterial hypotension   . PAF (paroxysmal atrial fibrillation) (HCC) 10/21/2015  . Severe anemia   . Symptomatic anemia   . Coronary artery disease due to lipid rich plaque 02/07/2014  . Obesity 02/07/2014  . Abdominal aortic ectasia (HCC) 09/06/2013  . Encounter for therapeutic drug monitoring 07/13/2013  . Atrial fibrillation (HCC) 12/20/2012  . Atrial fibrillation with controlled ventricular response (HCC) 11/30/2009  . NICOTINE ADDICTION 04/02/2009  . PARAPSORIASIS 12/15/2008  . Hyperlipidemia 12/14/2008  . HEART ATTACK 12/14/2008  . HEADACHE, CHRONIC 12/14/2008  . Depression 12/13/2008  . 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 COPD exacerbation (HCC) 12/13/2008  . ARTHRITIS 12/13/2008    Past Surgical History:  Procedure Laterality  Date  . CAD-CABG     x7, brief post-op Atrial Fib  . CESAREAN SECTION     x2  . COLONOSCOPY WITH PROPOFOL N/A 10/24/2015   Procedure: COLONOSCOPY WITH PROPOFOL;  Surgeon: Carman Ching, MD;  Location: [facility] ENDOSCOPY;  Service: Endoscopy;  Laterality: N/A;  . CORONARY ARTERY BYPASS GRAFT  2011  . EP IMPLANTABLE DEVICE N/A 12/26/2015   Procedure: Pacemaker Implant;  Surgeon: Marinus Maw, MD;  Location: [facility] INVASIVE CV LAB;  Service: Cardiovascular;  Laterality:  N/A;  . ESOPHAGOGASTRODUODENOSCOPY N/A 10/22/2015   Procedure: ESOPHAGOGASTRODUODENOSCOPY (EGD);  Surgeon: Carman Ching, MD;  Location: [facility] ENDOSCOPY;  Service: Endoscopy;  Laterality: N/A;  . ESOPHAGOGASTRODUODENOSCOPY N/A 12/24/2015   Procedure: ESOPHAGOGASTRODUODENOSCOPY (EGD);  Surgeon: Carman Ching, MD;  Location: [facility] ENDOSCOPY;  Service: Endoscopy;  Laterality: N/A;  . JOINT REPLACEMENT Bilateral   . TONSILLECTOMY    . TOTAL KNEE ARTHROPLASTY       OB History   No obstetric history on file.     Family History  Problem Relation Age of Onset  . Diabetes Mother   . Hypertension Mother   . Heart disease Mother        before age 14  . Heart disease Father        before age 30  . Hypertension Father   . Heart attack Father   . Lung cancer Maternal Grandfather   . Lung cancer Paternal Grandfather   . Hypertension Daughter   . Stroke Paternal Grandmother     Social History   Tobacco Use  . Smoking status: Current Some Day Smoker    Packs/day: 0.20    Types: Cigarettes  . Smokeless tobacco: Never Used  . Tobacco comment: "Occasionally I will smoke a cigarette"   Substance Use Topics  . Alcohol use: No    Alcohol/week: 0.0 standard drinks  . Drug use: No    Home Medications Prior to Admission medications   Medication Sig Start Date End Date Taking? Authorizing Provider  Acetaminophen (TYLENOL 8 HOUR ARTHRITIS PAIN PO) Take 2 tablets by mouth 2 (two) times daily. Back pain    [provider]  albuterol (PROVENTIL HFA;VENTOLIN HFA) 108 (90 Base) MCG/ACT inhaler Inhale 2 puffs into the lungs every 6 (six) hours as needed for wheezing or shortness of breath.    [provider]  ALPRAZolam Prudy Feeler) 1 MG tablet Take 1 mg by mouth at bedtime. 06/26/18   [provider]  atorvastatin (LIPITOR) 20 MG tablet Take 1 tablet (20 mg total) by mouth daily at 6 PM. 02/02/19   Jake Bathe, MD  budesonide-formoterol (SYMBICORT) 160-4.5 MCG/ACT inhaler Inhale 2  puffs into the lungs 2 (two) times daily. 01/15/14   Storm Frisk, MD  citalopram (CELEXA) 20 MG tablet Take 20 mg by mouth daily. 07/08/16   [provider]  clobetasol cream (TEMOVATE) 0.05 % Apply 1 application topically 2 (two) times daily. Affected skin 05/21/16   Vassie Loll, MD  diclofenac sodium (VOLTAREN) 1 % GEL Apply 4 g topically 4 (four) times daily. 07/26/18   Melene Plan, DO  diltiazem (TIAZAC) 120 MG 24 hr capsule Take 1 capsule (120 mg total) by mouth daily. 02/02/19   Jake Bathe, MD  docusate sodium (COLACE) 100 MG capsule Take 100 mg by mouth 2 (two) times daily.     [provider]  fluticasone (FLONASE) 50 MCG/ACT nasal spray Place 2 sprays into both nostrils daily. 12/21/15   Albertine Grates, MD  furosemide (  LASIX) 20 MG tablet Take 1 tablet (20 mg total) by mouth daily. 02/02/19   Jerline Pain, MD  metoprolol tartrate (LOPRESSOR) 50 MG tablet TAKE 1 TABLET AT BEDTIME; TAKE 1 ADDITIONAL TABLET AS NEEDED FOR PALPITATIONS 02/15/19   Jerline Pain, MD  pantoprazole (PROTONIX) 40 MG tablet Take 1 tablet (40 mg total) by mouth daily. 02/02/19   Jerline Pain, MD  potassium chloride SA (KLOR-CON) 20 MEQ tablet Take 1 tablet (20 mEq total) by mouth daily. 02/02/19   Jerline Pain, MD  tiZANidine (ZANAFLEX) 4 MG tablet Take 4 mg by mouth at bedtime as needed for muscle spasms. 07/12/16   [provider]    Allergies    Novocain [procaine] and Penicillins  Review of Systems   Review of Systems   All systems reviewed and negative, other than as noted in HPI.  Physical Exam Updated Vital Signs BP (!) 140/113 (BP Location: Right Arm)   Pulse 64   Temp 100 F (37.8 C) (Oral)   Resp 20   Ht 5\' 2"  (1.575 m)   Wt 89.4 kg   LMP  (LMP Unknown)   SpO2 92% Comment: Simultaneous filing. User may not have seen previous data.  BMI 36.03 kg/m   Physical Exam Vitals and nursing note reviewed.  Constitutional:      General: She is not in acute distress.     Appearance: She is well-developed.     Comments: Laying in bed. NAD.   HENT:     Head: Normocephalic and atraumatic.  Eyes:     General:        Right eye: No discharge.        Left eye: No discharge.     Conjunctiva/sclera: Conjunctivae normal.  Cardiovascular:     Rate and Rhythm: Normal rate and regular rhythm.     Heart sounds: Normal heart sounds. No murmur. No friction rub. No gallop.   Pulmonary:     Effort: Pulmonary effort is normal. No respiratory distress.     Breath sounds: Normal breath sounds.  Abdominal:     General: There is no distension.     Palpations: Abdomen is soft.     Tenderness: There is no abdominal tenderness.  Musculoskeletal:        General: No tenderness.     Cervical back: Neck supple.  Skin:    General: Skin is warm and dry.  Neurological:     Mental Status: She is alert.     Comments: Oriented to self and place. Answering questions appropriately although in a rambling manner. No focal neuro deficits.      ED Results / Procedures / Treatments   Labs (all labs ordered are listed, but only abnormal results are displayed) Labs Reviewed  URINE CULTURE - Abnormal; Notable for the following components:      Result Value   Culture MULTIPLE SPECIES PRESENT, SUGGEST RECOLLECTION (*)    All other components within normal limits  CBC WITH DIFFERENTIAL/PLATELET - Abnormal; Notable for the following components:   WBC 14.1 (*)    MCHC 29.4 (*)    Neutro Abs 11.0 (*)    Abs Immature Granulocytes 0.09 (*)    All other components within normal limits  BASIC METABOLIC PANEL - Abnormal; Notable for the following components:   Glucose, Bld 120 (*)    Creatinine, Ser 1.05 (*)    GFR calc non Af Amer 52 (*)    All other components within normal limits  URINALYSIS, ROUTINE W REFLEX MICROSCOPIC - Abnormal; Notable for the following components:   Protein, ur 30 (*)    All other components within normal limits  D-DIMER, QUANTITATIVE (NOT [facility]) - Abnormal;  Notable for the following components:   D-Dimer, Quant 1.43 (*)    All other components within normal limits  FERRITIN - Abnormal; Notable for the following components:   Ferritin 9 (*)    All other components within normal limits  C-REACTIVE PROTEIN - Abnormal; Notable for the following components:   CRP 1.0 (*)    All other components within normal limits  BRAIN NATRIURETIC PEPTIDE - Abnormal; Notable for the following components:   B Natriuretic Peptide 484.5 (*)    All other components within normal limits  BLOOD GAS, VENOUS - Abnormal; Notable for the following components:   pO2, Ven 52.4 (*)    All other components within normal limits  RAPID URINE DRUG SCREEN, HOSP PERFORMED - Abnormal; Notable for the following components:   Benzodiazepines POSITIVE (*)    All other components within normal limits  COMPREHENSIVE METABOLIC PANEL - Abnormal; Notable for the following components:   Glucose, Bld 140 (*)    Total Protein 8.3 (*)    All other components within normal limits  CBC - Abnormal; Notable for the following components:   WBC 18.9 (*)    Hemoglobin 11.9 (*)    MCHC 29.1 (*)    All other components within normal limits  BLOOD GAS, ARTERIAL - Abnormal; Notable for the following components:   pH, Arterial 7.254 (*)    pCO2 arterial 61.2 (*)    pO2, Arterial 79.4 (*)    All other components within normal limits  CBC WITH DIFFERENTIAL/PLATELET - Abnormal; Notable for the following components:   WBC 14.8 (*)    MCH 25.9 (*)    MCHC 29.6 (*)    Neutro Abs 13.5 (*)    Abs Immature Granulocytes 0.09 (*)    All other components within normal limits  BASIC METABOLIC PANEL - Abnormal; Notable for the following components:   Potassium 3.1 (*)    Chloride 95 (*)    Glucose, Bld 174 (*)    BUN 31 (*)    Creatinine, Ser 1.08 (*)    GFR calc non Af Amer 50 (*)    GFR calc Af Amer 58 (*)    Anion gap 16 (*)    All other components within normal limits  CBC WITH  DIFFERENTIAL/PLATELET - Abnormal; Notable for the following components:   WBC 18.6 (*)    MCHC 29.1 (*)    Neutro Abs 16.8 (*)    Abs Immature Granulocytes 0.18 (*)    All other components within normal limits  BASIC METABOLIC PANEL - Abnormal; Notable for the following components:   Glucose, Bld 174 (*)    BUN 37 (*)    Creatinine, Ser 1.14 (*)    GFR calc non Af Amer 47 (*)    GFR calc Af Amer 54 (*)    All other components within normal limits  CBC WITH DIFFERENTIAL/PLATELET - Abnormal; Notable for the following components:   WBC 15.3 (*)    MCHC 29.2 (*)    Neutro Abs 12.8 (*)    Abs Immature Granulocytes 0.14 (*)    All other components within normal limits  BASIC METABOLIC PANEL - Abnormal; Notable for the following components:   Chloride 93 (*)    Glucose, Bld 143 (*)    BUN 32 (*)  GFR calc non Af Amer 59 (*)    Anion gap 16 (*)    All other components within normal limits  BLOOD GAS, ARTERIAL - Abnormal; Notable for the following components:   pCO2 arterial 56.5 (*)    pO2, Arterial 74.5 (*)    Bicarbonate 34.7 (*)    Acid-Base Excess 8.6 (*)    All other components within normal limits  SARS CORONAVIRUS 2 (TAT 6-24 HRS)  CULTURE, BLOOD (ROUTINE X 2)  CULTURE, BLOOD (ROUTINE X 2)  MRSA PCR SCREENING  RESPIRATORY PANEL BY PCR  LACTIC ACID, PLASMA  LACTIC ACID, PLASMA  HEPATIC FUNCTION PANEL  PROCALCITONIN  LACTATE DEHYDROGENASE  TRIGLYCERIDES  FIBRINOGEN  CREATININE, URINE, RANDOM  SODIUM, URINE, RANDOM  ETHANOL  AMMONIA  MAGNESIUM  PHOSPHORUS  TSH  MAGNESIUM  TROPONIN I (HIGH SENSITIVITY)  TROPONIN I (HIGH SENSITIVITY)    EKG None  Radiology No results found.  Procedures Procedures (including critical care time)  Medications Ordered in ED Medications - No data to display  ED Course  I have reviewed the triage vital signs and the nursing notes.  Pertinent labs & imaging results that were available during my care of the patient were  reviewed by me and considered in my medical decision making (see chart for details).    MDM Rules/Calculators/A&P  73yF with some visual hallucinations. Febrile to 101.1 for EMS. Increased urinary frequency. Probably has UTI. She doesn't appear toxic. She rambles when she speaks but what she is saying is mostly appropriate for the questions asked. Basic labs, UA/culture, COVID. Reassess.   5:20 PM Law enforcement just brought IVC papers for patient. Apparently petitioned by her sister. Essentially stating that she is hallucinating (seeing bugs crawling on her) and not eating. Pt does have a history of anxiety and depression. This is not unexpected behavior from a 73 year old with a febrile illness though. I wouldn't be surprised if she has a UTI. Unless this behavior persists after improvement/treatment then I do not feel she needs a behavioral health evaluation.  I spoke with her sister Tilda Franco. Pt lives alone. She noticed a change in her behavior two days ago. She has been calling various family members repeatedly. Initially told them that cat's were downstairs giving birth in her home. Another time she was complaining of bugs crawling everywhere. Different family members have been over to see her and nothing has been out of sort other than pt's behavior.  Final Clinical Impression(s) / ED Diagnoses Final diagnoses:  Fever  SOB (shortness of breath)  Hallucinations  Rx / DC Orders ED Discharge Orders    None       Raeford Razor, MD 02/26/19 1039

## 2019-02-21 NOTE — ED Notes (Signed)
Pt ambulated to the restroom and O2 sat was found to be 85% on RA after ambulating.  Pt was able to recover to 91% RA.  Pt placed on 1LNC and O2 sat is 96%.

## 2019-02-21 NOTE — H&P (Signed)
Gabrielle [name] UJW:[id number] DOB: [birth year] DOA: 02/21/2019     PCP: Henrine Screws, MD   Outpatient Specialists:  CARDS:   Dr. Anne Fu   GI  Dr. Randa Evens [facility] )    Patient arrived to ER on 02/21/19 at 1605  Patient coming from: home Lives alone    Chief Complaint: Fever and hallucinations   HPI: Gabrielle [name] is a 73 y.o. female with medical history significant of COPD HLD, depression, GERD tachybradycardia syndrome, CAD, chronic combined systolic/diastolic CHF with EF of 45 to 56%, lower GI bleed, atrial fibrillation not on anticoagulation secondary to recurrent GI bleed    Presented with hallucinations.  Change in behavior been going on for past 2 days.  She has decreased her p.o. intake.  Has been calling repeatedly her family members sounded very confused.  She has been reporting that her cats was given per and that the bugs are crawling all over the walls.  Patient took 2 melatonin tablets yesterday.  Her family was concerned that that was too much and was causing her symptoms.  Family states in the past she have had hallucinations of UTI.  She was febrile at home up to 101.1 she has known history of COPD.  Patient does endorse increased urine frequency which caused her to stop her metoprolol because she was think it was making her urinate too often  Looking at records patient has been hospitalized in 2018 with delirium thought to be secondary to UTI Infectious risk factors:  Reports fever, shortness of breath, dry cough, chest pain, Sore throat, URI symptoms, anosmia/change in taste, N/V/Diarrhea/abdominal pain,  Body aches, severe fatigue Reports known sick contacts, known COVID 19 exposure, inability to completely isolate     In  ER RAPID COVID TEST  in house  PCR testing  Pending  Lab Results  Component Value Date   SARSCOV2NAA Not Detected 02/08/2019   SARSCOV2NAA NEGATIVE 07/01/2018     Regarding pertinent Chronic problems:     Hyperlipidemia -   on statins atorvastatin   HTN on diltiazem Lasix metoprolol   chronic CHF diastolic/systolic/ combined - last echo ECHO:05/17/16: The estimated ejection fraction was 55%.   Tachybradycardia syndrome for which she takes metoprolol   CAD  - On Aspirin, statin, betablocker,                  -  followed by cardiology                -Status post CABG x7     Morbid obesity-   BMI Readings from Last 1 Encounters:  02/21/19 36.03 kg/m        COPD - not followed by pulmonology not on baseline oxygen        Brief postoperative A. Fib -    CHA2DS2 vas score 5    Not on anticoagulation secondary to Risk of   recurrent bleeding         -  Rate control:  Currently controlled with  Metoprolol,     While in ER: Noted to be hypoxic on room air while ambulating to the bathroom down to 85% started on 1 L nasal cannula pulse ox of 96  Initially family members requested IVC   The following Work up has been ordered so far:  Orders Placed This Encounter  Procedures  . SARS CORONAVIRUS 2 (TAT 6-24 HRS) Nasopharyngeal Nasopharyngeal Swab  . Urine culture  . Blood culture (routine x 2)  . DG Chest  2 View  . CBC with Differential  . Basic metabolic panel  . Urinalysis, Routine w reflex microscopic  . Lactic acid, plasma  . Check Pulse Oximetry while ambulating  . Consult to hospitalist  ALL PATIENTS BEING ADMITTED/HAVING PROCEDURES NEED COVID-19 SCREENING    Following Medications were ordered in ER: Medications  sodium chloride 0.9 % bolus 1,000 mL (1,000 mLs Intravenous New Bag/Given 02/21/19 2021)        Consult Orders  (From admission, onward)         Start     Ordered   02/21/19 2144  Consult to hospitalist  ALL PATIENTS BEING ADMITTED/HAVING PROCEDURES NEED COVID-19 SCREENING  Once    Comments: ALL PATIENTS BEING ADMITTED/HAVING PROCEDURES NEED COVID-19 SCREENING  Provider:  (Not yet assigned)  Question Answer Comment  Place call to: [facility]   Reason for  Consult Admit      02/21/19 2143           Significant initial  Findings: Abnormal Labs Reviewed  CBC WITH DIFFERENTIAL/PLATELET - Abnormal; Notable for the following components:      Result Value   WBC 14.1 (*)    MCHC 29.4 (*)    Neutro Abs 11.0 (*)    Abs Immature Granulocytes 0.09 (*)    All other components within normal limits  BASIC METABOLIC PANEL - Abnormal; Notable for the following components:   Glucose, Bld 120 (*)    Creatinine, Ser 1.05 (*)    GFR calc non Af Amer 52 (*)    All other components within normal limits  URINALYSIS, ROUTINE W REFLEX MICROSCOPIC - Abnormal; Notable for the following components:   Protein, ur 30 (*)    All other components within normal limits    Otherwise labs showing:    Recent Labs  Lab 02/21/19 1755  NA 140  K 4.3  CO2 27  GLUCOSE 120*  BUN 14  CREATININE 1.05*  CALCIUM 9.7    Cr Up from baseline see below Lab Results  Component Value Date   CREATININE 1.05 (H) 02/21/2019   CREATININE 0.89 07/06/2018   CREATININE 0.93 07/05/2018    No results for input(s): AST, ALT, ALKPHOS, BILITOT, PROT, ALBUMIN in the last 168 hours. Lab Results  Component Value Date   CALCIUM 9.7 02/21/2019   PHOS 2.6 05/17/2016    WBC      Component Value Date/Time   WBC 14.1 (H) 02/21/2019 1755   ANC    Component Value Date/Time   NEUTROABS 11.0 (H) 02/21/2019 1755   ALC No components found for: LYMPHAB    Plt: Lab Results  Component Value Date   PLT 178 02/21/2019    Lactic Acid, Venous    Component Value Date/Time   LATICACIDVEN 1.2 02/21/2019 2018    Procalcitonin 0.13   COVID-19 Labs  Recent Labs    02/21/19 2212  DDIMER 1.43*  FERRITIN 9*  CRP 1.0*    Lab Results  Component Value Date   SARSCOV2NAA Not Detected 02/08/2019   SARSCOV2NAA NEGATIVE 07/01/2018     VBG ordered  ABG    Component Value Date/Time   PHART 7.402 05/13/2016 1530   PCO2ART 43.4 05/13/2016 1530   PO2ART 93.0 05/13/2016  1530   HCO3 26.5 05/13/2016 1530   TCO2 30 05/11/2016 2041   ACIDBASEDEF 3.0 (H) 01/02/2009 1534   O2SAT 97.2 05/13/2016 1530    HG/HCT  Stable,     Component Value Date/Time   HGB 12.1 02/21/2019 1755  HGB 13.4 09/28/2016 1524   HCT 41.1 02/21/2019 1755   HCT 39.3 09/28/2016 1524     ECG: Ordered    BNP (last 3 results) Recent Labs    07/03/18 1635 07/04/18 1049 02/21/19 2213  BNP 893.8* 1,210.2* 484.5*     UA   no evidence of UTI    Urine analysis:    Component Value Date/Time   COLORURINE YELLOW 02/21/2019 1810   APPEARANCEUR CLEAR 02/21/2019 1810   LABSPEC 1.018 02/21/2019 1810   PHURINE 6.0 02/21/2019 1810   GLUCOSEU NEGATIVE 02/21/2019 1810   HGBUR NEGATIVE 02/21/2019 1810   BILIRUBINUR NEGATIVE 02/21/2019 1810   KETONESUR NEGATIVE 02/21/2019 1810   PROTEINUR 30 (A) 02/21/2019 1810   UROBILINOGEN 1.0 12/02/2010 1023   NITRITE NEGATIVE 02/21/2019 1810   LEUKOCYTESUR NEGATIVE 02/21/2019 1810    Ordered  CXR -  NON acute mild cardiomegaly     ED Triage Vitals  Enc Vitals Group     BP 02/21/19 1620 130/80     Pulse Rate 02/21/19 1622 64     Resp 02/21/19 1620 20     Temp 02/21/19 1622 100 F (37.8 C)     Temp Source 02/21/19 1622 Oral     SpO2 02/21/19 1620 92 %     Weight 02/21/19 1625 197 lb (89.4 kg)     Height 02/21/19 1625  (1.575 m)     Head Circumference --      Peak Flow --      Pain Score 02/21/19 1625 0     Pain Loc --      Pain Edu? --      Excl. in GC? --   TMAX(24)@       Latest  Blood pressure (!) 144/80, pulse 77, temperature 97.9 F (36.6 C), temperature source Oral, resp. rate 20, height  (1.575 m), weight 89.4 kg, SpO2 95 %.     Hospitalist was called for admission for Sirs and hallucinations   Review of Systems:    Pertinent positives include:  Fevers, chills  Constitutional:  No weight loss, night sweats,  fatigue, weight loss  HEENT:  No headaches, Difficulty swallowing,Tooth/dental problems,Sore throat,   No sneezing, itching, ear ache, nasal congestion, post nasal drip,  Cardio-vascular:  No chest pain, Orthopnea, PND, anasarca, dizziness, palpitations.no Bilateral lower extremity swelling  GI:  No heartburn, indigestion, abdominal pain, nausea, vomiting, diarrhea, change in bowel habits, loss of appetite, melena, blood in stool, hematemesis Resp:  no shortness of breath at rest. No dyspnea on exertion, No excess mucus, no productive cough, No non-productive cough, No coughing up of blood.No change in color of mucus.No wheezing. Skin:  no rash or lesions. No jaundice GU:  no dysuria, change in color of urine, no urgency or frequency. No straining to urinate.  No flank pain.  Musculoskeletal:  No joint pain or no joint swelling. No decreased range of motion. No back pain.  Psych:  No change in mood or affect. No depression or anxiety. No memory loss.  Neuro: no localizing neurological complaints, no tingling, no weakness, no double vision, no gait abnormality, no slurred speech, no confusion  All systems reviewed and apart from HOPI all are negative  Past Medical History:   Past Medical History:  Diagnosis Date  . Anemia   . Arthritis   . Asthma   . Atrial fibrillation with controlled ventricular response (HCC) 11/2009   a. not on anticoagulation due to recurrent GI bleeding while on Eliquis.  Marland Kitchen  Chronic bronchitis   . Chronic diastolic CHF (congestive heart failure) ([place])    a. Echo 12/2015: EF 55-60% w/ Grade 3 DD  . Chronic headache   . COPD (chronic obstructive pulmonary disease) ([place])   . Depression   . Heart attack ([place])   . Hyperlipidemia   . Hypertension      Past Surgical History:  Procedure Laterality Date  . CAD-CABG     x7, brief post-op Atrial Fib  . CESAREAN SECTION     x2  . COLONOSCOPY WITH PROPOFOL N/A 10/24/2015   Procedure: COLONOSCOPY WITH PROPOFOL;  Surgeon: Laurence Spates, MD;  Location: [facility];  Service: Endoscopy;  Laterality: N/A;  .  CORONARY ARTERY BYPASS GRAFT  2011  . EP IMPLANTABLE DEVICE N/A 12/26/2015   Procedure: Pacemaker Implant;  Surgeon: Evans Lance, MD;  Location: [facility];  Service: Cardiovascular;  Laterality: N/A;  . ESOPHAGOGASTRODUODENOSCOPY N/A 10/22/2015   Procedure: ESOPHAGOGASTRODUODENOSCOPY (EGD);  Surgeon: Laurence Spates, MD;  Location[facility] ENDOSCOPY;  Service: Endoscopy;  Laterality: N/A;  . ESOPHAGOGASTRODUODENOSCOPY N/A 12/24/2015   Procedure: ESOPHAGOGASTRODUODENOSCOPY (EGD);  Surgeon: Laurence Spates, MD;  Location: [facility];  Service: Endoscopy;  Laterality: N/A;  . JOINT REPLACEMENT Bilateral   . TONSILLECTOMY    . TOTAL KNEE ARTHROPLASTY      Social History:  Ambulatory  Independently     reports that she has been smoking cigarettes. She has been smoking about 0.20 packs per day. She has never used smokeless tobacco. She reports that she does not drink alcohol or use drugs.   Family History:   Family History  Problem Relation Age of Onset  . Diabetes Mother   . Hypertension Mother   . Heart disease Mother        before age 37  . Heart disease Father        before age 83  . Hypertension Father   . Heart attack Father   . Lung cancer Maternal Grandfather   . Lung cancer Paternal Grandfather   . Hypertension Daughter   . Stroke Paternal Grandmother     Allergies: Allergies  Allergen Reactions  . Novocain [Procaine] Hives and Palpitations  . Penicillins Hives and Other (See Comments)    Has patient had a PCN reaction causing immediate rash, facial/tongue/throat swelling, SOB or lightheadedness with hypotension: No Has patient had a PCN reaction causing severe rash involving mucus membranes or skin necrosis: No Has patient had a PCN reaction that required hospitalization No Has patient had a PCN reaction occurring within the last 10 years: No If all of the above answers are "NO", then may proceed with Cephalosporin use.     Prior to Admission medications    Medication Sig Start Date End Date Taking? Authorizing Provider  Acetaminophen (TYLENOL 8 HOUR ARTHRITIS PAIN PO) Take 2 tablets by mouth 2 (two) times daily. Back pain   Yes [provider]  albuterol (PROVENTIL HFA;VENTOLIN HFA) 108 (90 Base) MCG/ACT inhaler Inhale 2 puffs into the lungs every 6 (six) hours as needed for wheezing or shortness of breath.   Yes [provider]  ALPRAZolam Duanne Moron) 1 MG tablet Take 1 mg by mouth at bedtime. 06/26/18  Yes [provider]  atorvastatin (LIPITOR) 20 MG tablet Take 1 tablet (20 mg total) by mouth daily at 6 PM. 02/02/19  Yes Skains, Thana Farr, MD  budesonide-formoterol (SYMBICORT) 160-4.5 MCG/ACT inhaler Inhale 2 puffs into the lungs 2 (two) times daily. 01/15/14  Yes Elsie Stain,  MD  citalopram (CELEXA) 20 MG tablet Take 20 mg by mouth daily. 07/08/16  Yes [provider]  COSENTYX SENSOREADY, 300 MG, 150 MG/ML SOAJ Inject 2 Syringes into the skin every 28 (twenty-eight) days. 01/31/19  Yes [provider]  diclofenac sodium (VOLTAREN) 1 % GEL Apply 4 g topically 4 (four) times daily. Patient taking differently: Apply 4 g topically 4 (four) times daily as needed (inflammation).  07/26/18  Yes Melene Plan, DO  diltiazem (TIAZAC) 120 MG 24 hr capsule Take 1 capsule (120 mg total) by mouth daily. 02/02/19  Yes Jake Bathe, MD  docusate sodium (COLACE) 100 MG capsule Take 100 mg by mouth 2 (two) times daily as needed for moderate constipation.    Yes [provider]  fluticasone (FLONASE) 50 MCG/ACT nasal spray Place 2 sprays into both nostrils daily. Patient taking differently: Place 2 sprays into both nostrils daily as needed for allergies.  12/21/15  Yes Albertine Grates, MD  furosemide (LASIX) 20 MG tablet Take 1 tablet (20 mg total) by mouth daily. 02/02/19  Yes Jake Bathe, MD  Melatonin 10 MG TABS Take 10 mg by mouth at bedtime.   Yes [provider]  metoprolol tartrate (LOPRESSOR) 50 MG tablet  TAKE 1 TABLET AT BEDTIME; TAKE 1 ADDITIONAL TABLET AS NEEDED FOR PALPITATIONS Patient taking differently: Take 50 mg by mouth at bedtime. TAKE 1 ADDITIONAL TABLET AS NEEDED FOR PALPITATIONS 02/15/19  Yes Jake Bathe, MD  pantoprazole (PROTONIX) 40 MG tablet Take 1 tablet (40 mg total) by mouth daily. 02/02/19  Yes Jake Bathe, MD  potassium chloride SA (KLOR-CON) 20 MEQ tablet Take 1 tablet (20 mEq total) by mouth daily. 02/02/19  Yes Jake Bathe, MD  tiZANidine (ZANAFLEX) 4 MG tablet Take 4 mg by mouth at bedtime as needed for muscle spasms. 07/12/16  Yes [provider]  clobetasol cream (TEMOVATE) 0.05 % Apply 1 application topically 2 (two) times daily. Affected skin Patient not taking: Reported on 02/21/2019 05/21/16   Vassie Loll, MD   Physical Exam: Blood pressure (!) 144/80, pulse 77, temperature 97.9 F (36.6 C), temperature source Oral, resp. rate 20, height 5\' 2"  (1.575 m), weight 89.4 kg, SpO2 95 %. 1. General:  in No Acute distress   Chronically ill  -appearing 2. Psychological: Alert and  Oriented to self 3. Head/ENT:   Dry Mucous Membranes                          Head Non traumatic, neck supple                           Poor Dentition 4. SKIN: decreased Skin turgor,  Skin clean Dry and intact no rash 5. Heart: Regular rate and rhythm no  Murmur, no Rub or gallop 6. Lungs:   no wheezes or crackles   7. Abdomen: Soft,  non-tender, Non distended  obese  bowel sounds present 8. Lower extremities: no clubbing, cyanosis, no edema 9. Neurologically Grossly intact, moving all 4 extremities equally   10. MSK: Normal range of motion   All other LABS:     Recent Labs  Lab 02/21/19 1755  WBC 14.1*  NEUTROABS 11.0*  HGB 12.1  HCT 41.1  MCV 89.5  PLT 178     Recent Labs  Lab 02/21/19 1755  NA 140  K 4.3  CL 103  CO2 27  GLUCOSE 120*  BUN 14  CREATININE 1.05*  CALCIUM 9.7     No results for input(s): AST, ALT, ALKPHOS, BILITOT, PROT, ALBUMIN in  the last 168 hours.     Cultures:    Component Value Date/Time   SDES  07/01/2018 2200    URINE, CLEAN CATCH Performed at [facility], [address] [place], [place] [address]    SPECREQUEST  07/01/2018 2200    NONE Performed at [facility], [address] [place], [place] [address]    CULT  07/01/2018 2200    Multiple bacterial morphotypes present, none predominant. Suggest appropriate recollection if clinically indicated.   REPTSTATUS 07/03/2018 FINAL 07/01/2018 2200     Radiological Exams on Admission: DG Chest 2 View  Result Date: 02/21/2019 CLINICAL DATA:  Hallucinations, altered mental status, fever. COPD. EXAM: CHEST - 2 VIEW COMPARISON:  07/02/2018 FINDINGS: Dual lead pacer in place. Prior CABG. Atherosclerotic calcification of the aortic arch. Stable scarring in the left mid lung and lung bases. Mild enlargement of the cardiopericardial silhouette, without edema. The lungs appear otherwise clear. IMPRESSION: 1. Stable scarring in the left mid lung and lung bases. 2. Mild enlargement of the cardiopericardial silhouette, without edema. Electronically Signed   By: Gaylyn RongWalter  Liebkemann M.D.   On: 02/21/2019 18:06    Chart has been reviewed    Assessment/Plan  73 y.o. female with medical history significant of COPD HLD, depression, GERD tachybradycardia syndrome, CAD, chronic combined systolic/diastolic CHF with EF of 45 to 82%50%, lower GI bleed, atrial fibrillation not on anticoagulation secondary to recurrent GI bleed Admitted for SIRS, hypoxia and acute metabolic encephalopathy  Present on Admission: . SIRS (systemic inflammatory response syndrome) (HCC) -unclear etiology relatively low normal procalcitonin patient is febrile but no source at this time.  Covid pending.  Admit as personal investigation. No evidence septic shock at this time Lactic acid normal  Will rehydrate obtain blood cultures respiratory panel Covid testing Given  the patient is hypoxic and short of breath most likely etiology pulmonary.  For tonight, with IV antibiotics given history of extensive medical problems including COPD Await results of CT angio to evaluate for possible PE and or small infiltrates not picked up by chest x-ray Patient is pen allergic but has been given cephalosporins in the past and tolerated well  . Hyperlipidemia chronic stable continue home medications  . Atrial fibrillation with controlled ventricular response (HCC) -not on chronic anticoagulation secondary to history of GI bleeding continue beta-blocker  History of chronic combined systolic diastolic CHF - - currently appears to be slightly on the dry side, hold home diuretics for tonight and restart when appears euvolemic, carefuly follow fluid status and Cr  . Acute metabolic encephalopathy -  - most likely multifactorial secondary to combination of infection   mild dehydration secondary to decreased by mouth intake,  polypharmacy   - Will rehydrate   - treat underlining infection   - Hold contributing medications  Patient supposed to be on Xanax as needed Monitor for any signs of withdrawal But hold for tonight   - if no improvement may need further imaging to evaluate for CNS pathology pathology such as MRI of the brain   - neurological exam appears to be nonfocal but patient unable to cooperate fully  - VBG ordered   - no history of liver disease    . Acute respiratory failure with hypoxia -unclear etiology patient with known history of COPD but no wheezing currently.  Will obtain CTA to  rule out PE given elevated D-dimer Evaluate for Covid given febrile illness confusion and hypoxia as well as shortness of breath  . AKI (acute kidney injury) (HCC) -obtain urine and electrolytes and rehydrate most likely secondary to dehydration   Other plan as per orders.  DVT prophylaxis:   Lovenox     Code Status:  FULL CODE  as per patient  I had personally discussed  CODE STATUS with patient    Family Communication:   Family not at  Bedside    Disposition Plan:    To home once workup is complete and patient is stable                      Would benefit from PT/OT eval prior to DC  Ordered                                       Consults called: None  Admission status:  ED Disposition    None      Obs     Level of care    tele  For 12H   please discontinue once patient no longer qualifies   Precautions: admitted as PUI   Airborne and Contact precautions  If Covid PCR is negative  would need additional investigation given very high risk for false nagive test result, unless other explanation for febrile illness with hypoxia shortness of breath is found   PPE: Used by the provider:   P100  eye Goggles,  Gloves  gown    Gilbert Manolis 02/22/2019, 12:28 AM    [facility]     after 2 AM please page floor coverage PA If 7AM-7PM, please contact the day team taking care of the patient using [email or web address]

## 2019-02-21 NOTE — ED Notes (Signed)
Patient transported to X-ray 

## 2019-02-21 NOTE — ED Notes (Signed)
Ambulated pt to toilet and back to stretcher.

## 2019-02-21 NOTE — ED Triage Notes (Signed)
[facility] transported pt to [facility] ED and reports the following:  Pt family called 911 because family thought she overdosed on sleeping pills last night. Pt said she took 2 melatonin last night. Pt is visually hallucinating and knows the hallucinations are not real yet she is oriented. Family reports pt exhibited this behavior last time she had a UTI. Temperature of 101.1 . Pt is oriented and not confused. Hx COPD.   EMS found the following medications at the patients residence atorvastatin, citalopram, diltiazem, furosemide, melatonin, metoprolol, pantoprazole, potassium, and tinanidine. EMS does not think the pt overdosed on any of these medications.

## 2019-02-22 ENCOUNTER — Inpatient Hospital Stay (HOSPITAL_COMMUNITY): Payer: Medicare Other

## 2019-02-22 ENCOUNTER — Encounter (HOSPITAL_COMMUNITY): Payer: Self-pay | Admitting: Internal Medicine

## 2019-02-22 ENCOUNTER — Observation Stay (HOSPITAL_COMMUNITY): Payer: Medicare Other

## 2019-02-22 DIAGNOSIS — J441 Chronic obstructive pulmonary disease with (acute) exacerbation: Secondary | ICD-10-CM | POA: Diagnosis present

## 2019-02-22 DIAGNOSIS — E860 Dehydration: Secondary | ICD-10-CM | POA: Diagnosis present

## 2019-02-22 DIAGNOSIS — K219 Gastro-esophageal reflux disease without esophagitis: Secondary | ICD-10-CM | POA: Diagnosis present

## 2019-02-22 DIAGNOSIS — R443 Hallucinations, unspecified: Secondary | ICD-10-CM | POA: Diagnosis present

## 2019-02-22 DIAGNOSIS — N179 Acute kidney failure, unspecified: Secondary | ICD-10-CM | POA: Diagnosis present

## 2019-02-22 DIAGNOSIS — E782 Mixed hyperlipidemia: Secondary | ICD-10-CM | POA: Diagnosis not present

## 2019-02-22 DIAGNOSIS — I959 Hypotension, unspecified: Secondary | ICD-10-CM | POA: Diagnosis present

## 2019-02-22 DIAGNOSIS — G9341 Metabolic encephalopathy: Secondary | ICD-10-CM | POA: Diagnosis present

## 2019-02-22 DIAGNOSIS — I517 Cardiomegaly: Secondary | ICD-10-CM

## 2019-02-22 DIAGNOSIS — F419 Anxiety disorder, unspecified: Secondary | ICD-10-CM | POA: Diagnosis present

## 2019-02-22 DIAGNOSIS — I4891 Unspecified atrial fibrillation: Secondary | ICD-10-CM | POA: Diagnosis not present

## 2019-02-22 DIAGNOSIS — J9600 Acute respiratory failure, unspecified whether with hypoxia or hypercapnia: Secondary | ICD-10-CM | POA: Diagnosis present

## 2019-02-22 DIAGNOSIS — I110 Hypertensive heart disease with heart failure: Secondary | ICD-10-CM | POA: Diagnosis present

## 2019-02-22 DIAGNOSIS — F329 Major depressive disorder, single episode, unspecified: Secondary | ICD-10-CM | POA: Diagnosis present

## 2019-02-22 DIAGNOSIS — J440 Chronic obstructive pulmonary disease with acute lower respiratory infection: Secondary | ICD-10-CM | POA: Diagnosis present

## 2019-02-22 DIAGNOSIS — J189 Pneumonia, unspecified organism: Secondary | ICD-10-CM | POA: Diagnosis present

## 2019-02-22 DIAGNOSIS — I480 Paroxysmal atrial fibrillation: Secondary | ICD-10-CM | POA: Diagnosis present

## 2019-02-22 DIAGNOSIS — E785 Hyperlipidemia, unspecified: Secondary | ICD-10-CM | POA: Diagnosis present

## 2019-02-22 DIAGNOSIS — E872 Acidosis: Secondary | ICD-10-CM | POA: Diagnosis present

## 2019-02-22 DIAGNOSIS — R6510 Systemic inflammatory response syndrome (SIRS) of non-infectious origin without acute organ dysfunction: Secondary | ICD-10-CM | POA: Diagnosis present

## 2019-02-22 DIAGNOSIS — Z20828 Contact with and (suspected) exposure to other viral communicable diseases: Secondary | ICD-10-CM | POA: Diagnosis present

## 2019-02-22 DIAGNOSIS — I2510 Atherosclerotic heart disease of native coronary artery without angina pectoris: Secondary | ICD-10-CM | POA: Diagnosis present

## 2019-02-22 DIAGNOSIS — I5043 Acute on chronic combined systolic (congestive) and diastolic (congestive) heart failure: Secondary | ICD-10-CM | POA: Diagnosis present

## 2019-02-22 DIAGNOSIS — E876 Hypokalemia: Secondary | ICD-10-CM | POA: Diagnosis present

## 2019-02-22 DIAGNOSIS — I495 Sick sinus syndrome: Secondary | ICD-10-CM | POA: Diagnosis present

## 2019-02-22 DIAGNOSIS — J9601 Acute respiratory failure with hypoxia: Secondary | ICD-10-CM | POA: Diagnosis present

## 2019-02-22 DIAGNOSIS — I160 Hypertensive urgency: Secondary | ICD-10-CM | POA: Diagnosis present

## 2019-02-22 LAB — LACTATE DEHYDROGENASE: LDH: 175 U/L (ref 98–192)

## 2019-02-22 LAB — COMPREHENSIVE METABOLIC PANEL
ALT: 12 U/L (ref 0–44)
AST: 17 U/L (ref 15–41)
Albumin: 4.4 g/dL (ref 3.5–5.0)
Alkaline Phosphatase: 60 U/L (ref 38–126)
Anion gap: 10 (ref 5–15)
BUN: 13 mg/dL (ref 8–23)
CO2: 26 mmol/L (ref 22–32)
Calcium: 9.6 mg/dL (ref 8.9–10.3)
Chloride: 105 mmol/L (ref 98–111)
Creatinine, Ser: 0.91 mg/dL (ref 0.44–1.00)
GFR calc Af Amer: >60 mL/min (ref >60)
GFR calc non Af Amer: >60 mL/min (ref >60)
Glucose, Bld: 140 mg/dL — ABNORMAL HIGH (ref 70–99)
Potassium: 4.6 mmol/L (ref 3.5–5.1)
Sodium: 141 mmol/L (ref 135–145)
Total Bilirubin: 1 mg/dL (ref 0.3–1.2)
Total Protein: 8.3 g/dL — ABNORMAL HIGH (ref 6.5–8.1)

## 2019-02-22 LAB — CBC
HCT: 40.9 % (ref 36.0–46.0)
Hemoglobin: 11.9 g/dL — ABNORMAL LOW (ref 12.0–15.0)
MCH: 26.4 pg (ref 26.0–34.0)
MCHC: 29.1 g/dL — ABNORMAL LOW (ref 30.0–36.0)
MCV: 90.7 fL (ref 80.0–100.0)
Platelets: 247 10*3/uL (ref 150–400)
RBC: 4.51 MIL/uL (ref 3.87–5.11)
RDW: 15.2 % (ref 11.5–15.5)
WBC: 18.9 10*3/uL — ABNORMAL HIGH (ref 4.0–10.5)
nRBC: 0 % (ref 0.0–0.2)

## 2019-02-22 LAB — BLOOD GAS, ARTERIAL
Acid-base deficit: 1.6 mmol/L (ref 0.0–2.0)
Bicarbonate: 26.2 mmol/L (ref 20.0–28.0)
Drawn by: 331471
O2 Content: 4 L/min
O2 Saturation: 93.8 %
Patient temperature: 98.6
pCO2 arterial: 61.2 mmHg — ABNORMAL HIGH (ref 32.0–48.0)
pH, Arterial: 7.254 — ABNORMAL LOW (ref 7.350–7.450)
pO2, Arterial: 79.4 mmHg — ABNORMAL LOW (ref 83.0–108.0)

## 2019-02-22 LAB — HEPATIC FUNCTION PANEL
ALT: 12 U/L (ref 0–44)
AST: 19 U/L (ref 15–41)
Albumin: 4.4 g/dL (ref 3.5–5.0)
Alkaline Phosphatase: 56 U/L (ref 38–126)
Bilirubin, Direct: 0.2 mg/dL (ref 0.0–0.2)
Indirect Bilirubin: 0.5 mg/dL (ref 0.3–0.9)
Total Bilirubin: 0.7 mg/dL (ref 0.3–1.2)
Total Protein: 7.6 g/dL (ref 6.5–8.1)

## 2019-02-22 LAB — RAPID URINE DRUG SCREEN, HOSP PERFORMED
Amphetamines: NOT DETECTED
Barbiturates: NOT DETECTED
Benzodiazepines: POSITIVE — AB
Cocaine: NOT DETECTED
Opiates: NOT DETECTED
Tetrahydrocannabinol: NOT DETECTED

## 2019-02-22 LAB — BLOOD GAS, VENOUS
Acid-Base Excess: 0.3 mmol/L (ref 0.0–2.0)
Bicarbonate: 26.8 mmol/L (ref 20.0–28.0)
O2 Saturation: 83 %
Patient temperature: 98.6
pCO2, Ven: 54.5 mmHg (ref 44.0–60.0)
pH, Ven: 7.312 (ref 7.250–7.430)
pO2, Ven: 52.4 mmHg — ABNORMAL HIGH (ref 32.0–45.0)

## 2019-02-22 LAB — SARS CORONAVIRUS 2 (TAT 6-24 HRS): SARS Coronavirus 2: NEGATIVE

## 2019-02-22 LAB — ECHOCARDIOGRAM COMPLETE
Height: 62 in
Weight: 3152 oz

## 2019-02-22 LAB — MAGNESIUM: Magnesium: 2.3 mg/dL (ref 1.7–2.4)

## 2019-02-22 LAB — AMMONIA: Ammonia: 26 umol/L (ref 9–35)

## 2019-02-22 LAB — SODIUM, URINE, RANDOM: Sodium, Ur: 103 mmol/L

## 2019-02-22 LAB — TROPONIN I (HIGH SENSITIVITY)
Troponin I (High Sensitivity): 12 ng/L (ref <18)
Troponin I (High Sensitivity): 12 ng/L (ref <18)

## 2019-02-22 LAB — PHOSPHORUS: Phosphorus: 3.7 mg/dL (ref 2.5–4.6)

## 2019-02-22 LAB — TSH: TSH: 1.688 u[IU]/mL (ref 0.350–4.500)

## 2019-02-22 LAB — MRSA PCR SCREENING: MRSA by PCR: NEGATIVE

## 2019-02-22 LAB — CREATININE, URINE, RANDOM: Creatinine, Urine: 174.05 mg/dL

## 2019-02-22 LAB — ETHANOL: Alcohol, Ethyl (B): <10 mg/dL (ref <10)

## 2019-02-22 MED ORDER — PERFLUTREN LIPID MICROSPHERE
1.0000 mL | INTRAVENOUS | Status: AC | PRN
  Administered 2019-02-22: 2 mL via INTRAVENOUS
  Filled 2019-02-22: qty 10

## 2019-02-22 MED ORDER — LABETALOL HCL 5 MG/ML IV SOLN: 20.0000 mg | Freq: Once | INTRAVENOUS | Status: DC

## 2019-02-22 MED ORDER — BUDESONIDE 0.25 MG/2ML IN SUSP
0.2500 mg | Freq: Two times a day (BID) | RESPIRATORY_TRACT | Status: DC
  Administered 2019-02-22 – 2019-02-24 (×4): 0.25 mg via RESPIRATORY_TRACT
  Filled 2019-02-22 (×4): qty 2

## 2019-02-22 MED ORDER — IOHEXOL 350 MG/ML SOLN
100.0000 mL | Freq: Once | INTRAVENOUS | Status: AC | PRN
  Administered 2019-02-22: 03:00:00 100 mL via INTRAVENOUS

## 2019-02-22 MED ORDER — SODIUM CHLORIDE 0.9 % IV SOLN
2.0000 g | Freq: Every day | INTRAVENOUS | Status: DC
  Administered 2019-02-22 – 2019-02-24 (×3): 2 g via INTRAVENOUS
  Filled 2019-02-22 (×2): qty 2
  Filled 2019-02-22: qty 20

## 2019-02-22 MED ORDER — FUROSEMIDE 10 MG/ML IJ SOLN
40.0000 mg | Freq: Two times a day (BID) | INTRAMUSCULAR | Status: DC
  Administered 2019-02-22 – 2019-02-23 (×4): 40 mg via INTRAVENOUS
  Filled 2019-02-22 (×4): qty 4

## 2019-02-22 MED ORDER — HYDROCODONE-ACETAMINOPHEN 5-325 MG PO TABS
1.0000 | ORAL_TABLET | ORAL | Status: DC | PRN
  Administered 2019-02-24: 21:00:00 1 via ORAL
  Filled 2019-02-22: qty 1

## 2019-02-22 MED ORDER — LABETALOL HCL 5 MG/ML IV SOLN
20.0000 mg | INTRAVENOUS | Status: DC | PRN
  Administered 2019-02-22 – 2019-02-25 (×5): 20 mg via INTRAVENOUS
  Filled 2019-02-22 (×6): qty 4

## 2019-02-22 MED ORDER — SODIUM CHLORIDE 0.9 % IV SOLN
500.0000 mg | Freq: Every day | INTRAVENOUS | Status: DC
  Administered 2019-02-22 – 2019-02-24 (×3): 500 mg via INTRAVENOUS
  Filled 2019-02-22 (×3): qty 500

## 2019-02-22 MED ORDER — MOMETASONE FURO-FORMOTEROL FUM 200-5 MCG/ACT IN AERO: 2.0000 | INHALATION_SPRAY | Freq: Two times a day (BID) | RESPIRATORY_TRACT | Status: DC

## 2019-02-22 MED ORDER — ACETAMINOPHEN 650 MG RE SUPP: 650.0000 mg | Freq: Four times a day (QID) | RECTAL | Status: DC | PRN

## 2019-02-22 MED ORDER — ACETAMINOPHEN 325 MG PO TABS
650.0000 mg | ORAL_TABLET | Freq: Four times a day (QID) | ORAL | Status: DC | PRN
  Administered 2019-02-23: 21:00:00 650 mg via ORAL
  Filled 2019-02-22: qty 2

## 2019-02-22 MED ORDER — ORAL CARE MOUTH RINSE
15.0000 mL | Freq: Two times a day (BID) | OROMUCOSAL | Status: DC
  Administered 2019-02-22 – 2019-02-27 (×9): 15 mL via OROMUCOSAL

## 2019-02-22 MED ORDER — IPRATROPIUM-ALBUTEROL 0.5-2.5 (3) MG/3ML IN SOLN
3.0000 mL | Freq: Four times a day (QID) | RESPIRATORY_TRACT | Status: DC
  Administered 2019-02-23: 09:00:00 3 mL via RESPIRATORY_TRACT
  Filled 2019-02-22: qty 3

## 2019-02-22 MED ORDER — ENOXAPARIN SODIUM 40 MG/0.4ML ~~LOC~~ SOLN
40.0000 mg | SUBCUTANEOUS | Status: DC
  Administered 2019-02-22 – 2019-03-01 (×8): 40 mg via SUBCUTANEOUS
  Filled 2019-02-22 (×8): qty 0.4

## 2019-02-22 MED ORDER — ONDANSETRON HCL 4 MG/2ML IJ SOLN: 4.0000 mg | Freq: Four times a day (QID) | INTRAMUSCULAR | Status: DC | PRN

## 2019-02-22 MED ORDER — ONDANSETRON HCL 4 MG PO TABS: 4.0000 mg | ORAL_TABLET | Freq: Four times a day (QID) | ORAL | Status: DC | PRN

## 2019-02-22 MED ORDER — SODIUM CHLORIDE (PF) 0.9 % IJ SOLN
INTRAMUSCULAR | Status: AC
  Filled 2019-02-22: qty 50

## 2019-02-22 MED ORDER — CHLORHEXIDINE GLUCONATE 0.12 % MT SOLN
15.0000 mL | Freq: Two times a day (BID) | OROMUCOSAL | Status: DC
  Administered 2019-02-22 – 2019-03-01 (×14): 15 mL via OROMUCOSAL
  Filled 2019-02-22 (×11): qty 15

## 2019-02-22 MED ORDER — CITALOPRAM HYDROBROMIDE 20 MG PO TABS
20.0000 mg | ORAL_TABLET | Freq: Every day | ORAL | Status: DC
  Administered 2019-02-22 – 2019-03-01 (×8): 20 mg via ORAL
  Filled 2019-02-22 (×2): qty 1
  Filled 2019-02-22: qty 2
  Filled 2019-02-22 (×5): qty 1

## 2019-02-22 MED ORDER — IPRATROPIUM-ALBUTEROL 0.5-2.5 (3) MG/3ML IN SOLN
3.0000 mL | Freq: Four times a day (QID) | RESPIRATORY_TRACT | Status: DC
  Administered 2019-02-22 (×2): 3 mL via RESPIRATORY_TRACT
  Filled 2019-02-22 (×2): qty 3

## 2019-02-22 MED ORDER — IPRATROPIUM-ALBUTEROL 0.5-2.5 (3) MG/3ML IN SOLN
RESPIRATORY_TRACT | Status: AC
  Administered 2019-02-22: 09:00:00 3 mL via RESPIRATORY_TRACT
  Filled 2019-02-22: qty 3

## 2019-02-22 MED ORDER — ATORVASTATIN CALCIUM 10 MG PO TABS
20.0000 mg | ORAL_TABLET | Freq: Every day | ORAL | Status: DC
  Administered 2019-02-22 – 2019-02-28 (×7): 20 mg via ORAL
  Filled 2019-02-22 (×7): qty 2

## 2019-02-22 MED ORDER — METOPROLOL TARTRATE 25 MG PO TABS
50.0000 mg | ORAL_TABLET | Freq: Every day | ORAL | Status: DC
  Administered 2019-02-22 – 2019-02-23 (×3): 50 mg via ORAL
  Filled 2019-02-22 (×3): qty 2

## 2019-02-22 MED ORDER — ALBUTEROL SULFATE HFA 108 (90 BASE) MCG/ACT IN AERS
2.0000 | INHALATION_SPRAY | Freq: Four times a day (QID) | RESPIRATORY_TRACT | Status: DC | PRN
  Administered 2019-02-22: 09:00:00 2 via RESPIRATORY_TRACT
  Filled 2019-02-22: qty 6.7

## 2019-02-22 MED ORDER — METHYLPREDNISOLONE SODIUM SUCC 40 MG IJ SOLR
40.0000 mg | Freq: Two times a day (BID) | INTRAMUSCULAR | Status: DC
  Administered 2019-02-22 – 2019-02-25 (×7): 40 mg via INTRAVENOUS
  Filled 2019-02-22 (×7): qty 1

## 2019-02-22 MED ORDER — CHLORHEXIDINE GLUCONATE CLOTH 2 % EX PADS
6.0000 | MEDICATED_PAD | Freq: Every day | CUTANEOUS | Status: DC
  Administered 2019-02-22 – 2019-02-26 (×6): 6 via TOPICAL

## 2019-02-22 MED ORDER — DILTIAZEM HCL ER COATED BEADS 120 MG PO CP24
120.0000 mg | ORAL_CAPSULE | Freq: Every day | ORAL | Status: DC
  Administered 2019-02-22 – 2019-02-23 (×2): 120 mg via ORAL
  Filled 2019-02-22 (×3): qty 1

## 2019-02-22 NOTE — Progress Notes (Addendum)
PROGRESS NOTE    Gabrielle [name]  ZOX:[id number] DOB: 06-28-1945 DOA: 02/21/2019 PCP: Henrine Screws, MD   Brief Narrative:   Patient is a 73 year old female with history of COPD, hyperlipidemia, depression, GERD, tachybradycardia syndrome, coronary disease,, and systolic/diastolic CHF , lower GI bleed, A. fib not on anticoagulation due to his recurrent GI bleed who presents with hallucinations.  She also had decreased p.o. intake.  She was very confused at home.  Family was concerned about urinary tract infection.  She was also febrile at home.  Patient found to be hypoxic on arrival.  Patient went into acute respiratory distress in the ED.  ABG showed hypercarbia.  Started on BiPAP, IV diuresis, IV steroids.  She was also noted to be hypotensive.  Admitted for management of acute hypoxic respiratory failure secondary to CHF/COPD exacerbation with possible pneumonia.  Assessment & Plan:   Active Problems:   Hyperlipidemia   Atrial fibrillation with controlled ventricular response (HCC)   Hypoxia   SIRS (systemic inflammatory response syndrome) (HCC)   Acute metabolic encephalopathy   Acute respiratory failure (HCC)   AKI (acute kidney injury) (HCC)   Acute hypoxic respiratory failure: ABG done this morning showed acidosis, PCO2 of 61, hypoxia with O2 of 79. Most likely this is from acute on chronic CHF/COPD exacerbation versus pneumonia.Had  to put on BiPAP.  No evidence of PE as per CT angio.  Currently maintaining saturation on BiPAP.  Monitor in stepdown.  Suspected pneumonia: CT chest showed new small areas of patchy airspace opacity at the left lung base and posterior right upper lung.  Started on antibiotics for possible community-acquired pneumonia.  Covid test 19 test negative.  Lactic acid normal.  Procalcitonin non reassuring.  Continue current antibiotics for now.  Acute on chronic combined CHF: Has history of combined CHF.  On diuretics at home.  Echocardiogram showed  ejection fraction of 50 to 55%, grade 2 diastolic dysfunction.  Started on Lasix this morning.  She had crackles on bilateral lung bases.  Altered mental status: She was hallucinating and confused at home.  She does not have any history of mental disorders as per her son.  She has history of anxiety/depression and is on medications.  Her altered mental status could be secondary to hypoxia.  We will continue to monitor.  If she continues to be altered, will consider CT head  Hypertensive urgency: Severely hypertensive on presentation.  Continue current medicines and as needed labetalol  COPD exacerbation: Wheezing on examination on presentation.  Started on steroids.  She is on Symbicort at home.  AKI: Currently stable  Paroxysmal A. Fib/history of tachybradycardia syndrome: Currently on rate is controlled.  Status post pacemaker.  Continue Cardizem, metoprolol.  Not on anticoagulation due to history of GI bleed  Hyperlipidemia: Continue statin.  Anxiety: On Xanax at home.  On Celexa.  Leukocytosis: Likely reactive or could be shared with pneumonia.  Continue to monitor CBC.  History of coronary artery disease: Status post CABG in 2010.  Currently stable.             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 Not received Talked to the son on phone and given update  Disposition Plan: Home when clinically stable   Consultants: None  Procedures: None  Antimicrobials:  Anti-infectives (From admission, onward)   Start     Dose/Rate Route Frequency Ordered Stop   02/22/19 0100  azithromycin (ZITHROMAX) 500 mg in sodium chloride 0.9 % 250 mL IVPB  500 mg 250 mL/hr over 60 Minutes Intravenous Daily 02/22/19 0021     02/22/19 0045  cefTRIAXone (ROCEPHIN) 2 g in sodium chloride 0.9 % 100 mL IVPB     2 g 200 mL/hr over 30 Minutes Intravenous Daily 02/22/19 0021        Subjective:  Patient seen and examined at the bedside  this morning in the emergency department.  She was in acute respiratory distress when I evaluated her.  She was put on BiPAP.  She was not able to participate with any meaningful conversation.  She is maintaining her saturation with BiPAP.  Severely hypertensive Objective: Vitals:   02/22/19 1100 02/22/19 1141 02/22/19 1200 02/22/19 1300  BP: (!) 166/108 (!) 177/61 (!) 191/93 (!) 155/122  Pulse: 75  75 71  Resp: (!) 25  (!) 22 (!) 23  Temp: 98 F (36.7 C)     TempSrc: Axillary     SpO2: 99%  100% 99%  Weight:      Height:        Intake/Output Summary (Last 24 hours) at 02/22/2019 1329 Last data filed at 02/22/2019 1100 Gross per 24 hour  Intake 350 ml  Output 300 ml  Net 50 ml   Filed Weights   02/21/19 1625  Weight: 89.4 kg    Examination:  General exam: In acute respiratory distress  HEENT:PERRL Ear/Nose normal on gross exam Respiratory system: Bilateral decreased air entry, bilateral crackles Cardiovascular system: S1 & S2 heard, RRR. No JVD, murmurs, rubs, gallops or clicks. Trace  pedal edema. Gastrointestinal system: Abdomen is nondistended, soft and nontender. No organomegaly or masses felt. Normal bowel sounds heard. Central nervous system: Not alert or awake  Extremities:Trace bilateral lower extremity edema, no clubbing ,no cyanosis, distal peripheral pulses palpable. Skin: No rashes, lesions or ulcers,no icterus ,no pallor    Data Reviewed: I have personally reviewed following labs and imaging studies  CBC: Recent Labs  Lab 02/21/19 1755 02/22/19 0730  WBC 14.1* 18.9*  NEUTROABS 11.0*  --   HGB 12.1 11.9*  HCT 41.1 40.9  MCV 89.5 90.7  PLT 178 247   Basic Metabolic Panel: Recent Labs  Lab 02/21/19 1755 02/22/19 0730  NA 140 141  K 4.3 4.6  CL 103 105  CO2 27 26  GLUCOSE 120* 140*  BUN 14 13  CREATININE 1.05* 0.91  CALCIUM 9.7 9.6  MG  --  2.3  PHOS  --  3.7   GFR: Estimated Creatinine Clearance: 55.5 mL/min (by C-G formula based on SCr  of 0.91 mg/dL). Liver Function Tests: Recent Labs  Lab 02/21/19 2212 02/22/19 0730  AST 19 17  ALT 12 12  ALKPHOS 56 60  BILITOT 0.7 1.0  PROT 7.6 8.3*  ALBUMIN 4.4 4.4   No results for input(s): LIPASE, AMYLASE in the last 168 hours. Recent Labs  Lab 02/22/19 0107  AMMONIA 26   Coagulation Profile: No results for input(s): INR, PROTIME in the last 168 hours. Cardiac Enzymes: No results for input(s): CKTOTAL, CKMB, CKMBINDEX, TROPONINI in the last 168 hours. BNP (last 3 results) No results for input(s): PROBNP in the last 8760 hours. HbA1C: No results for input(s): HGBA1C in the last 72 hours. CBG: No results for input(s): GLUCAP in the last 168 hours. Lipid Profile: Recent Labs    02/21/19 2212  TRIG 82   Thyroid Function Tests: Recent Labs    02/22/19 0731  TSH 1.688   Anemia Panel: Recent Labs    02/21/19 2212  FERRITIN 9*   Sepsis Labs: Recent Labs  Lab 02/21/19 2018 02/21/19 2212 02/21/19 2307  PROCALCITON  --  0.13  --   LATICACIDVEN 1.2  --  1.0    Recent Results (from the past 240 hour(s))  SARS CORONAVIRUS 2 (TAT 6-24 HRS) Nasopharyngeal Nasopharyngeal Swab     Status: None   Collection Time: 02/21/19  6:15 PM   Specimen: Nasopharyngeal Swab  Result Value Ref Range Status   SARS Coronavirus 2 NEGATIVE NEGATIVE Final    Comment: (NOTE) SARS-CoV-2 target nucleic acids are NOT DETECTED. The SARS-CoV-2 RNA is generally detectable in upper and lower respiratory specimens during the acute phase of infection. Negative results do not preclude SARS-CoV-2 infection, do not rule out co-infections with other pathogens, and should not be used as the sole basis for treatment or other patient management decisions. Negative results must be combined with clinical observations, patient history, and epidemiological information. The expected result is Negative. Fact Sheet for Patients: HairSlick.no Fact Sheet for Healthcare  Providers: quierodirigir.com This test is not yet approved or cleared by the [place] FDA and  has been authorized for detection and/or diagnosis of SARS-CoV-2 by FDA under an Emergency Use Authorization (EUA). This EUA will remain  in effect (meaning this test can be used) for the duration of the COVID-19 declaration under Section 56 4(b)(1) of the Act, 21 U.S.C. section 360bbb-3(b)(1), unless the authorization is terminated or revoked sooner. Performed at [facility], [address] [place], [place] [address]   Blood culture (routine x 2)     Status: None (Preliminary result)   Collection Time: 02/21/19  8:18 PM   Specimen: BLOOD  Result Value Ref Range Status   Specimen Description   Final    BLOOD RIGHT ANTECUBITAL Performed at [facility], [address] [place], [place] [address]    Special Requests   Final    BOTTLES DRAWN AEROBIC AND ANAEROBIC Blood Culture adequate volume Performed at [facility], [address], [place], [place] [address]    Culture   Final    NO GROWTH < 12 HOURS Performed at [facility], [address], [place], [place] [address]    Report Status PENDING  Incomplete         Radiology Studies: DG Chest 1 View  Result Date: 02/22/2019 CLINICAL DATA:  Shortness of breath, fever EXAM: CHEST  1 VIEW COMPARISON:  02/21/2019 FINDINGS: Increased interstitial prominence and patchy density primarily at the lung bases. No significant pleural effusion. No pneumothorax. Stable cardiomediastinal contours with cardiomegaly and evidence of prior cardiac surgery. Left chest wall dual lead pacemaker is again noted. IMPRESSION: Interstitial prominence and increased density at the lung bases, which could reflect edema or pneumonia in the appropriate setting. Electronically Signed   By: Guadlupe Spanish M.D.   On: 02/22/2019 10:58   DG Chest 2 View  Result Date: 02/21/2019 CLINICAL  DATA:  Hallucinations, altered mental status, fever. COPD. EXAM: CHEST - 2 VIEW COMPARISON:  07/02/2018 FINDINGS: Dual lead pacer in place. Prior CABG. Atherosclerotic calcification of the aortic arch. Stable scarring in the left mid lung and lung bases. Mild enlargement of the cardiopericardial silhouette, without edema. The lungs appear otherwise clear. IMPRESSION: 1. Stable scarring in the left mid lung and lung bases. 2. Mild enlargement of the cardiopericardial silhouette, without edema. Electronically Signed   By: Gaylyn Rong M.D.   On: 02/21/2019 18:06   CT ANGIO CHEST PE W  OR WO CONTRAST  Result Date: 02/22/2019 CLINICAL DATA:  Fever and elevated D-dimer EXAM: CT ANGIOGRAPHY CHEST WITH CONTRAST TECHNIQUE: Multidetector CT imaging of the chest was performed using the standard protocol during bolus administration of intravenous contrast. Multiplanar CT image reconstructions and MIPs were obtained to evaluate the vascular anatomy. CONTRAST:  OMNIPAQUE IOHEXOL 350 MG/ML SOLN COMPARISON:  CT chest December 22, 2015 FINDINGS: Cardiovascular: There is a optimal opacification of the pulmonary arteries. There is no central,segmental, or subsegmental filling defects within the pulmonary arteries. There is moderate cardiomegaly. A left-sided AICD seen with the lead tips in the right atrium and right ventricle. Mitral valve and coronary artery calcifications are seen. Overlying median sternotomy wires. There is normal three-vessel brachiocephalic anatomy without proximal stenosis. The thoracic aorta is normal in appearance. Scattered aortic atherosclerotic calcifications are seen without aneurysmal dilatation. Mediastinum/Nodes: No hilar, mediastinal, or axillary adenopathy. Thyroid gland, trachea, and esophagus demonstrate no significant findings. Lungs/Pleura: There are stable areas of streaky scarring seen throughout both lungs. There is a new patchy airspace opacity seen within the posterior right  upper lobe. There is also a rounded patchy airspace opacity at the periphery of the left lung base. No large area of consolidation or pleural effusion. Upper Abdomen: No acute abnormalities present in the visualized portions of the upper abdomen. Musculoskeletal: No chest wall abnormality. No acute or significant osseous findings. There is unchanged compression deformity of the T9 and T12 vertebral bodies are seen with 50-75% loss of vertebral body height. There is slight superior compression deformity of the T11 vertebral body with less than 25% loss in height, with slight interval progression since the prior exam. No retropulsion of fragments is seen. There is buckling of the T12 posterior cortex as on prior exam. Review of the MIP images confirms the above findings. IMPRESSION: No central, segmental, or subsegmental pulmonary embolism. New small areas of patchy airspace opacity at the left lung base and posterior right upper lung. These are nonspecific could be due to atelectasis and/or early multifocal infection. Stable areas of streaky scarring. Moderate cardiomegaly Chronic compression deformities of the thoracic spine as described above. Electronically Signed   By: Jonna Clark M.D.   On: 02/22/2019 03:45   ECHOCARDIOGRAM COMPLETE  Result Date: 02/22/2019   ECHOCARDIOGRAM REPORT   Patient Name:   LOMETA RIGGIN Date of Exam: 02/22/2019 Medical Rec #:  [id number]          Height:       62.0 in Accession #:    [id number]         Weight:       197.0 lb Date of Birth:  24-Dec-1945          BSA:          1.90 m Patient Age:    73 years           BP:           181/78 mmHg Patient Gender: F                  HR:           74 bpm. Exam Location:  Inpatient Procedure: 2D Echo, Color Doppler, Cardiac Doppler and Intracardiac            Opacification Agent Indications:    I51.7 Cardiomegaly  History:        Patient has prior history of Echocardiogram examinations, most  recent 05/17/2016. CHF,  Pacemaker, COPD, Arrythmias:Atrial                 Fibrillation; Risk Factors:Dyslipidemia and Hypertension.  Sonographer:    Irving Burton Senior RDCS Referring Phys: [id number] ANASTASSIA DOUTOVA  Sonographer Comments: Technically difficult study due to poor echo windows. Patient supine on CPAP. IMPRESSIONS  1. Left ventricular ejection fraction, by visual estimation, is 50 to 55%. The left ventricle has normal function. There is no left ventricular hypertrophy.  2. Left ventricular diastolic parameters are consistent with Grade II diastolic dysfunction (pseudonormalization).  3. Mildly dilated left ventricular internal cavity size.  4. The left ventricle demonstrates regional wall motion abnormalities.  5. Global right ventricle was not well visualized.The right ventricular size is not well visualized. Right vetricular wall thickness was not assessed.  6. Left atrial size was severely dilated.  7. Right atrial size was not well visualized.  8. Moderate mitral annular calcification.  9. The mitral valve is normal in structure. Mild mitral valve regurgitation. No evidence of mitral stenosis. 10. The tricuspid valve is not well visualized. 11. The aortic valve is tricuspid. Aortic valve regurgitation is not visualized. Mild aortic valve sclerosis without stenosis. 12. The pulmonic valve was not well visualized. Pulmonic valve regurgitation is trivial. 13. Mildly elevated pulmonary artery systolic pressure. 14. The inferior vena cava is normal in size with greater than 50% respiratory variability, suggesting right atrial pressure of 3 mmHg. 15. Technically difficult; definity used; mild apical hypokinesis; overall preserved LV function; grade 2 diastolic dysfunction; mild LVE; severe LAE; mild MR; right heart not well visualized. FINDINGS  Left Ventricle: Left ventricular ejection fraction, by visual estimation, is 50 to 55%. The left ventricle has normal function. The left ventricle demonstrates regional wall motion  abnormalities. The left ventricular internal cavity size was mildly dilated left ventricle. There is no left ventricular hypertrophy. Left ventricular diastolic parameters are consistent with Grade II diastolic dysfunction (pseudonormalization). Normal left atrial pressure. Right Ventricle: The right ventricular size is not well visualized.Global RV systolic function is was not well visualized. The tricuspid regurgitant velocity is 2.70 m/s, and with an assumed right atrial pressure of 3 mmHg, the estimated right ventricular systolic pressure is mildly elevated at 32.2 mmHg. Left Atrium: Left atrial size was severely dilated. Right Atrium: Right atrial size was not well visualized Pericardium: There is no evidence of pericardial effusion. Mitral Valve: The mitral valve is normal in structure. Moderate mitral annular calcification. Mild mitral valve regurgitation. No evidence of mitral valve stenosis by observation. MV peak gradient, 10.9 mmHg. Tricuspid Valve: The tricuspid valve is not well visualized. Tricuspid valve regurgitation is trivial. Aortic Valve: The aortic valve is tricuspid. Aortic valve regurgitation is not visualized. Mild aortic valve sclerosis is present, with no evidence of aortic valve stenosis. Pulmonic Valve: The pulmonic valve was not well visualized. Pulmonic valve regurgitation is trivial. Pulmonic regurgitation is trivial. Aorta: The aortic root is normal in size and structure. Venous: The inferior vena cava is normal in size with greater than 50% respiratory variability, suggesting right atrial pressure of 3 mmHg.  Additional Comments: Technically difficult; definity used; mild apical hypokinesis; overall preserved LV function; grade 2 diastolic dysfunction; mild LVE; severe LAE; mild MR; right heart not well visualized.  LEFT VENTRICLE PLAX 2D LVIDd:         5.50 cm LVIDs:         2.90 cm LV PW:         1.00 cm LV IVS:  1.00 cm LVOT diam:     2.00 cm LV SV:         115 ml LV SV  Index:   57.04 LVOT Area:     3.14 cm  RIGHT VENTRICLE RV S prime:     8.49 cm/s TAPSE (M-mode): 2.3 cm LEFT ATRIUM             Index LA diam:        4.60 cm 2.42 cm/m LA Vol (A2C):   95.4 ml 50.22 ml/m LA Vol (A4C):   80.1 ml 42.17 ml/m LA Biplane Vol: 88.0 ml 46.33 ml/m  AORTIC VALVE LVOT Vmax:   97.90 cm/s LVOT Vmean:  55.700 cm/s LVOT VTI:    0.164 m  AORTA Ao Root diam: 2.80 cm Ao Asc diam:  3.30 cm MITRAL VALVE            TRICUSPID VALVE MV Peak grad: 10.9 mmHg TR Peak grad:   29.2 mmHg MV Mean grad: 4.0 mmHg  TR Vmax:        270.00 cm/s MV Vmax:      1.65 m/s MV Vmean:     98.6 cm/s SHUNTS MV VTI:       0.35 m    Systemic VTI:  0.16 m                         Systemic Diam: 2.00 cm  Kirk Ruths MD Electronically signed by Kirk Ruths MD Signature Date/Time: 02/22/2019/12:22:46 PM    Final         Scheduled Meds: . atorvastatin  20 mg Oral q1800  . budesonide (PULMICORT) nebulizer solution  0.25 mg Nebulization BID  . chlorhexidine  15 mL Mouth Rinse BID  . Chlorhexidine Gluconate Cloth  6 each Topical Daily  . citalopram  20 mg Oral Daily  . diltiazem  120 mg Oral Daily  . enoxaparin (LOVENOX) injection  40 mg Subcutaneous Q24H  . furosemide  40 mg Intravenous Q12H  . ipratropium-albuterol  3 mL Nebulization Q6H  . mouth rinse  15 mL Mouth Rinse q12n4p  . methylPREDNISolone (SOLU-MEDROL) injection  40 mg Intravenous Q12H  . metoprolol tartrate  50 mg Oral QHS   Continuous Infusions: . azithromycin Stopped (02/22/19 0700)  . cefTRIAXone (ROCEPHIN)  IV Stopped (02/22/19 0136)     LOS: 0 days    Time spent: 35 mins.More than 50% of that time was spent in counseling and/or coordination of care.      Shelly Coss, MD [facility] Pager [phone]  If 7PM-7AM, please contact night-coverage [email or web address] Password [email or web address] 02/22/2019, 1:29 PM

## 2019-02-22 NOTE — ED Notes (Signed)
RT is at bedside assessing patient at this time.

## 2019-02-22 NOTE — TOC Progression Note (Signed)
Transition of Care [facility]) - Progression Note    Patient Details  Name: Gabrielle [name] MRN: [id number] Date of Birth: [birth year]  [facility] of [facility]) CM/SW Contact  Servando Snare, [place] Phone Number: 02/22/2019, 11:41 AM  Clinical Narrative:   LCSW notified by TTS that patient is under IVC and needs a 1st examination. LCSW notified attending via secure chat.           Expected Discharge Plan and Services                                                 Social Determinants of Health (SDOH) Interventions    Readmission Risk Interventions No flowsheet data found.

## 2019-02-22 NOTE — ED Notes (Signed)
Nurse secretary has paged MD Adhikari.

## 2019-02-22 NOTE — Progress Notes (Signed)
Echocardiogram 2D Echocardiogram has been performed.  Gabrielle [name] 02/22/2019, 10:34 AM

## 2019-02-22 NOTE — ED Notes (Signed)
Portable Chest X-ray at bedside

## 2019-02-22 NOTE — ED Notes (Signed)
Patient is IVC'ed for safety, not behavioral.  Paperwork is at nurse work station.

## 2019-02-22 NOTE — ED Notes (Signed)
Ultrasound Tech for Echocardiogram is at bedside at this time.

## 2019-02-22 NOTE — ED Notes (Signed)
Based on patient current condition, recent ABG and patient medical history. Patient is being placed emergently on Bi-Pap at this time.

## 2019-02-22 NOTE — ED Notes (Signed)
RN has called Respiratory Therapy to assess patient respiratory status. RT will be in route to assess patient ASAP.

## 2019-02-22 NOTE — Progress Notes (Signed)
PT Cancellation Note  Patient Details Name: Gabrielle [name] MRN: [id number] DOB: Mar 21, 1945   Cancelled Treatment:    Reason Eval/Treat Not Completed: Medical issues which prohibited therapy;Patient not medically ready(RT called in this AM for emergent placement on Bi-PAP. RN note "Based on patient current condition, recent ABG and patient medical history. Patient is being placed emergently on Bi-Pap at this time." RN, Ali Lowe.) Will follow up at later date/time when pt is medically ready.    Kipp Brood, PT, DPT Physical Therapist with [facility]  02/22/2019 11:44 AM

## 2019-02-22 NOTE — ED Notes (Signed)
RN has just spoken to MD Tawanna Solo and updated MD on patient status.

## 2019-02-23 ENCOUNTER — Other Ambulatory Visit: Payer: Self-pay

## 2019-02-23 LAB — URINE CULTURE

## 2019-02-23 MED ORDER — IPRATROPIUM-ALBUTEROL 0.5-2.5 (3) MG/3ML IN SOLN
3.0000 mL | Freq: Three times a day (TID) | RESPIRATORY_TRACT | Status: DC
  Administered 2019-02-23: 15:00:00 3 mL via RESPIRATORY_TRACT
  Filled 2019-02-23: qty 3

## 2019-02-23 MED ORDER — LOSARTAN POTASSIUM 50 MG PO TABS
50.0000 mg | ORAL_TABLET | Freq: Every day | ORAL | Status: DC
  Administered 2019-02-23 – 2019-02-25 (×3): 50 mg via ORAL
  Filled 2019-02-23 (×3): qty 1

## 2019-02-23 MED ORDER — SODIUM CHLORIDE 0.9 % IV SOLN
INTRAVENOUS | Status: DC | PRN
  Administered 2019-02-23 – 2019-02-24 (×2): 250 mL via INTRAVENOUS

## 2019-02-23 MED ORDER — LEVALBUTEROL HCL 0.63 MG/3ML IN NEBU: 0.6300 mg | INHALATION_SOLUTION | RESPIRATORY_TRACT | Status: DC | PRN

## 2019-02-23 MED ORDER — DILTIAZEM HCL-DEXTROSE 125-5 MG/125ML-% IV SOLN (PREMIX)
5.0000 mg/h | INTRAVENOUS | Status: DC
  Administered 2019-02-23: 17:00:00 5 mg/h via INTRAVENOUS
  Administered 2019-02-24 (×2): 10 mg/h via INTRAVENOUS
  Administered 2019-02-25: 8 mg/h via INTRAVENOUS
  Filled 2019-02-23 (×4): qty 125

## 2019-02-23 NOTE — Progress Notes (Signed)
Patient had 4 quick successions of vtach longest lasting 16 beats.  Patient's does complain of palpitations, EKG completed, shows afib with PVCS rate in the 80s.  Updated midlevel Sharlet [place], will add mag level to AM labs.

## 2019-02-23 NOTE — Evaluation (Addendum)
Physical Therapy Evaluation Patient Details Name: Gabrielle [name] MRN: [id number] DOB: [birth year] Today's Date: 02/23/2019   History of Present Illness  73 year old female with history of COPD, hyperlipidemia, depression, GERD, tachybradycardia syndrome, coronary disease,, and systolic/diastolic CHF , lower GI bleed, A. fib not on anticoagulation due to his recurrent GI bleed who presents with hallucinations  Clinical Impression  Pt admitted with above diagnosis. Min assist supine to sit, min/guard for sit to stand and stand pivot transfer with RW. SaO2 90% on room air at rest, 94% on 2L O2  at rest.  Pt currently with functional limitations due to the deficits listed below (see PT Problem List). Pt will benefit from skilled PT to increase their independence and safety with mobility to allow discharge to the venue listed below.       Follow Up Recommendations Home health PT vs. SNF, depending on progress;Supervision for mobility/OOB    Equipment Recommendations  None recommended by PT    Recommendations for Other Services       Precautions / Restrictions Precautions Precautions: Other (comment) Precaution Comments: monitor O2 Restrictions Weight Bearing Restrictions: No      Mobility  Bed Mobility Overal bed mobility: Needs Assistance Bed Mobility: Supine to Sit     Supine to sit: Min assist     General bed mobility comments: HOB up, min A to raise trunk  Transfers Overall transfer level: Needs assistance Equipment used: Rolling walker (2 wheeled) Transfers: Sit to/from [employer] Sit to Stand: Min guard Stand pivot transfers: Min guard       General transfer comment: pt took several pivotal steps from bed to recliner; SaO2 90% on room air at rest, 94% on 2L O2 at rest  Ambulation/Gait                Stairs            Wheelchair Mobility    Modified Rankin (Stroke Patients Only)       Balance Overall balance assessment:  Modified Independent                                           Pertinent Vitals/Pain Pain Assessment: No/denies pain    Home Living Family/patient expects to be discharged to:: Private residence Living Arrangements: Alone Available Help at Discharge: Family;Available PRN/intermittently   Home Access: Stairs to enter Entrance Stairs-Rails: Right Entrance Stairs-Number of Steps: 4 Home Layout: Laundry or work area in basement;Able to live on main level with bedroom/bathroom Home Equipment: Environmental consultant - 2 wheels Additional Comments: uses RW when going out; denies h/o falls in past 1 year    Prior Function Level of Independence: Independent with assistive device(s)         Comments: family assists with laundry and shopping, pt reports she "holds on to the walls" walking inside and takes RW when going out, sister comes to assist prn     Hand Dominance        Extremity/Trunk Assessment   Upper Extremity Assessment Upper Extremity Assessment: Defer to OT evaluation    Lower Extremity Assessment Lower Extremity Assessment: Overall WFL for tasks assessed    Cervical / Trunk Assessment Cervical / Trunk Assessment: Normal  Communication   Communication: No difficulties  Cognition Arousal/Alertness: Awake/alert Behavior During Therapy: WFL for tasks assessed/performed Overall Cognitive Status: Within Functional Limits for tasks assessed  General Comments      Exercises     Assessment/Plan    PT Assessment Patient needs continued PT services  PT Problem List Decreased activity tolerance;Decreased mobility;Cardiopulmonary status limiting activity       PT Treatment Interventions Gait training;Stair training;Functional mobility training;Therapeutic exercise;Therapeutic activities;Patient/family education    PT Goals (Current goals can be found in the Care Plan section)  Acute Rehab PT  Goals Patient Stated Goal: get stronger and go see grandkids PT Goal Formulation: With patient Time For Goal Achievement: 03/09/19 Potential to Achieve Goals: Good    Frequency Min 3X/week   Barriers to discharge        Co-evaluation               AM-PAC PT "6 Clicks" Mobility  Outcome Measure Help needed turning from your back to your side while in a flat bed without using bedrails?: A Little Help needed moving from lying on your back to sitting on the side of a flat bed without using bedrails?: A Little Help needed moving to and from a bed to a chair (including a wheelchair)?: A Little Help needed standing up from a chair using your arms (e.g., wheelchair or bedside chair)?: A Little Help needed to walk in hospital room?: A Lot Help needed climbing 3-5 steps with a railing? : A Lot 6 Click Score: 16    End of Session Equipment Utilized During Treatment: Gait belt;Oxygen Activity Tolerance: Patient tolerated treatment well Patient left: in chair;with call bell/phone within reach;with chair alarm set Nurse Communication: Mobility status PT Visit Diagnosis: Difficulty in walking, not elsewhere classified (R26.2)    Time: 1330-1350 PT Time Calculation (min) (ACUTE ONLY): 20 min   Charges:   PT Evaluation $PT Eval Low Complexity: 1 Low         Gabrielle [name] PT 02/23/2019  Acute Rehabilitation Services Pager [phone] Office [phone]

## 2019-02-23 NOTE — TOC Initial Note (Signed)
Transition of Care [facility]) - Initial/Assessment Note    Patient Details  Name: Gabrielle [name] MRN: [id number] Date of Birth: 1945/04/14  [facility] CM/SW Contact:    Lynnell Catalan, RN Phone Number: 02/23/2019, 11:01 AM  Clinical Narrative:                 This CM was contacted about pt IVC. This CM checked IVC paperwork on pt shadow chart. The IVC paperwork was not completed so was never valid. MD made aware.

## 2019-02-23 NOTE — Progress Notes (Signed)
PROGRESS NOTE    Gabrielle [name]  DZH:[id number] DOB: [birth year] DOA: 02/21/2019 PCP: Aura Dials, MD   Brief Narrative:   Patient is a 73 year old female with history of COPD, hyperlipidemia, depression, GERD, tachybradycardia syndrome, coronary disease,, and systolic/diastolic CHF , lower GI bleed, A. fib not on anticoagulation due to his recurrent GI bleed who presents with hallucinations.  She also had decreased p.o. intake.  She was very confused at home.  Family was concerned about urinary tract infection.  She was also febrile at home.  Patient found to be hypoxic on arrival.  Patient went into acute respiratory distress in the ED.  ABG showed hypercarbia.  Started on BiPAP, IV diuresis, IV steroids.  She was also noted to be hypotensive.  Admitted for management of acute hypoxic respiratory failure secondary to CHF/COPD exacerbation with possible pneumonia.   Assessment & Plan:   Active Problems:   Hyperlipidemia   Atrial fibrillation with controlled ventricular response (HCC)   Hypoxia   SIRS (systemic inflammatory response syndrome) (HCC)   Acute metabolic encephalopathy   Acute respiratory failure (HCC)   AKI (acute kidney injury) ([place])   Acute hypoxic respiratory failure: ABG done this morning showed acidosis, PCO2 of 61, hypoxia with O2 of 79. Most likely this is from acute on chronic CHF/COPD exacerbation versus pneumonia.Had  to put on BiPAP.  No evidence of PE as per CT angio.  Currently off BiPAP and is on nasal cannula on 3 L of oxygen per minute. Will check if she qualifies for home oxygen.  Suspected pneumonia: CT chest showed new small areas of patchy airspace opacity at the left lung base and posterior right upper lung.  Started on antibiotics for possible community-acquired pneumonia.  Covid test 19 test negative.  Lactic acid normal.  Procalcitonin non reassuring.  Continue current antibiotics for now.  Acute on chronic combined CHF: Has history of  combined CHF.  On diuretics at home.  Echocardiogram showed ejection fraction of 50 to 41%, grade 2 diastolic dysfunction.  Started on Lasix .  She had crackles on bilateral lung bases.  I will continue current Lasix dose for today.  Altered mental status: She was hallucinating and confused at home.  She does not have any history of mental disorders as per her son.  She has history of anxiety/depression and is on medications.  Her altered mental status could be secondary to hypoxia.  She is completely alert and oriented  Hypertensive urgency: Pressure much better.  Continue current medicines and as needed labetalol  COPD exacerbation: Wheezing on examination on presentation.  Started on steroids.  She is on Symbicort at home.  Continue IV steroids for today.  AKI: Resolved  Paroxysmal A. Fib/history of tachybradycardia syndrome: Currently  rate is controlled.  Status post pacemaker.  Continue Cardizem, metoprolol.  Not on anticoagulation due to history of GI bleed  Hyperlipidemia: Continue statin.  Anxiety: On Xanax at home.  On Celexa.  Leukocytosis: Likely reactive or could be associated  with pneumonia.  Continue to monitor CBC.  History of coronary artery disease: Status post CABG in 2010.  Currently stable.             DVT prophylaxis:Lovenox Code Status: Full Family Communication: Called son on phone on 02/22/2019  Disposition Plan: Home in 1 to 2 days.   Consultants: None  Procedures: None  Antimicrobials:  Anti-infectives (From admission, onward)   Start     Dose/Rate Route Frequency Ordered Stop   02/22/19 0100  azithromycin (ZITHROMAX) 500 mg in sodium chloride 0.9 % 250 mL IVPB     500 mg 250 mL/hr over 60 Minutes Intravenous Daily 02/22/19 0021     02/22/19 0045  cefTRIAXone (ROCEPHIN) 2 g in sodium chloride 0.9 % 100 mL IVPB     2 g 200 mL/hr over 30 Minutes Intravenous Daily 02/22/19 0021        Subjective:   Patient seen and examined the bedside  this morning.  Looks comfortable today.  Hemodynamically stable.  Alert and oriented.  Stable for transfer to the floor.  Still on 3 L of oxygen per minute.  Not in any respiratory distress  Objective: Vitals:   02/23/19 1000 02/23/19 1100 02/23/19 1125 02/23/19 1200  BP: (!) 149/82 (!) 122/103  (!) 148/107  Pulse: 78 74  85  Resp: 18 (!) 27  20  Temp:   98.6 F (37 C)   TempSrc:   Oral   SpO2: 96% 95%  96%  Weight:      Height:        Intake/Output Summary (Last 24 hours) at 02/23/2019 1308 Last data filed at 02/23/2019 1200 Gross per 24 hour  Intake 376.5 ml  Output 1050 ml  Net -673.5 ml   Filed Weights   02/21/19 1625  Weight: 89.4 kg    Examination:  General exam: Morbidly obese, not in distress HEENT:PERRL Ear/Nose normal on gross exam Respiratory system: Bilateral decreased air entry, bilateral wheezing/crackles Cardiovascular system: S1 & S2 heard, RRR. No JVD, murmurs, rubs, gallops or clicks. Trace  pedal edema. Gastrointestinal system: Abdomen is nondistended, soft and nontender. No organomegaly or masses felt. Normal bowel sounds heard. Central nervous system: Alert and oriented Extremities:Trace bilateral lower extremity edema, no clubbing ,no cyanosis, distal peripheral pulses palpable. Skin: No rashes, lesions or ulcers,no icterus ,no pallor    Data Reviewed: I have personally reviewed following labs and imaging studies  CBC: Recent Labs  Lab 02/21/19 1755 02/22/19 0730  WBC 14.1* 18.9*  NEUTROABS 11.0*  --   HGB 12.1 11.9*  HCT 41.1 40.9  MCV 89.5 90.7  PLT 178 247   Basic Metabolic Panel: Recent Labs  Lab 02/21/19 1755 02/22/19 0730  NA 140 141  K 4.3 4.6  CL 103 105  CO2 27 26  GLUCOSE 120* 140*  BUN 14 13  CREATININE 1.05* 0.91  CALCIUM 9.7 9.6  MG  --  2.3  PHOS  --  3.7   GFR: Estimated Creatinine Clearance: 55.5 mL/min (by C-G formula based on SCr of 0.91 mg/dL). Liver Function Tests: Recent Labs  Lab 02/21/19 2212  02/22/19 0730  AST 19 17  ALT 12 12  ALKPHOS 56 60  BILITOT 0.7 1.0  PROT 7.6 8.3*  ALBUMIN 4.4 4.4   No results for input(s): LIPASE, AMYLASE in the last 168 hours. Recent Labs  Lab 02/22/19 0107  AMMONIA 26   Coagulation Profile: No results for input(s): INR, PROTIME in the last 168 hours. Cardiac Enzymes: No results for input(s): CKTOTAL, CKMB, CKMBINDEX, TROPONINI in the last 168 hours. BNP (last 3 results) No results for input(s): PROBNP in the last 8760 hours. HbA1C: No results for input(s): HGBA1C in the last 72 hours. CBG: No results for input(s): GLUCAP in the last 168 hours. Lipid Profile: Recent Labs    02/21/19 2212  TRIG 82   Thyroid Function Tests: Recent Labs    02/22/19 0731  TSH 1.688   Anemia Panel: Recent Labs    02/21/19  2212  FERRITIN 9*   Sepsis Labs: Recent Labs  Lab 02/21/19 2018 02/21/19 2212 02/21/19 2307  PROCALCITON  --  0.13  --   LATICACIDVEN 1.2  --  1.0    Recent Results (from the past 240 hour(s))  Urine culture     Status: Abnormal   Collection Time: 02/21/19  6:10 PM   Specimen: Urine, Random  Result Value Ref Range Status   Specimen Description   Final    URINE, RANDOM Performed at [facility], [address], [place], [place] [address]    Special Requests   Final    NONE Performed at [facility], [address] [place], [place] [address]    Culture MULTIPLE SPECIES PRESENT, SUGGEST RECOLLECTION (A)  Final   Report Status 02/23/2019 FINAL  Final  SARS CORONAVIRUS 2 (TAT 6-24 HRS) Nasopharyngeal Nasopharyngeal Swab     Status: None   Collection Time: 02/21/19  6:15 PM   Specimen: Nasopharyngeal Swab  Result Value Ref Range Status   SARS Coronavirus 2 NEGATIVE NEGATIVE Final    Comment: (NOTE) SARS-CoV-2 target nucleic acids are NOT DETECTED. The SARS-CoV-2 RNA is generally detectable in upper and lower respiratory specimens during the acute phase of infection.  Negative results do not preclude SARS-CoV-2 infection, do not rule out co-infections with other pathogens, and should not be used as the sole basis for treatment or other patient management decisions. Negative results must be combined with clinical observations, patient history, and epidemiological information. The expected result is Negative. Fact Sheet for Patients: HairSlick.no Fact Sheet for Healthcare Providers: quierodirigir.com This test is not yet approved or cleared by the [place] FDA and  has been authorized for detection and/or diagnosis of SARS-CoV-2 by FDA under an Emergency Use Authorization (EUA). This EUA will remain  in effect (meaning this test can be used) for the duration of the COVID-19 declaration under Section 56 4(b)(1) of the Act, 21 U.S.C. section 360bbb-3(b)(1), unless the authorization is terminated or revoked sooner. Performed at [facility], [address], [place], [place] [address]   Blood culture (routine x 2)     Status: None (Preliminary result)   Collection Time: 02/21/19  8:18 PM   Specimen: BLOOD  Result Value Ref Range Status   Specimen Description   Final    BLOOD RIGHT ANTECUBITAL Performed at [facility], [address] [place], [place] [address]    Special Requests   Final    BOTTLES DRAWN AEROBIC AND ANAEROBIC Blood Culture adequate volume Performed at [facility], [address], [place], [place] [address]    Culture   Final    NO GROWTH 2 DAYS Performed at [facility], [address] [place], [place] [address]    Report Status PENDING  Incomplete  Blood culture (routine x 2)     Status: None (Preliminary result)   Collection Time: 02/21/19 11:07 PM   Specimen: BLOOD  Result Value Ref Range Status   Specimen Description   Final    BLOOD RIGHT ANTECUBITAL Performed at [facility], [address] [place], [place] [address]    Special Requests   Final    BOTTLES DRAWN AEROBIC AND ANAEROBIC Blood Culture results may not be optimal due to an excessive volume of blood received in culture bottles Performed at [facility], [address] [place], [place] [address]    Culture   Final  NO GROWTH 1 DAY Performed at [facility], [address] [place], [place] [address]    Report Status PENDING  Incomplete  MRSA PCR Screening     Status: None   Collection Time: 02/22/19 11:24 AM   Specimen: Nasal Mucosa; Nasopharyngeal  Result Value Ref Range Status   MRSA by PCR NEGATIVE NEGATIVE Final    Comment:        The GeneXpert MRSA Assay (FDA approved for NASAL specimens only), is one component of a comprehensive MRSA colonization surveillance program. It is not intended to diagnose MRSA infection nor to guide or monitor treatment for MRSA infections. Performed at [facility], [address] [place], [place] [address]          Radiology Studies: DG Chest 1 View  Result Date: 02/22/2019 CLINICAL DATA:  Shortness of breath, fever EXAM: CHEST  1 VIEW COMPARISON:  02/21/2019 FINDINGS: Increased interstitial prominence and patchy density primarily at the lung bases. No significant pleural effusion. No pneumothorax. Stable cardiomediastinal contours with cardiomegaly and evidence of prior cardiac surgery. Left chest wall dual lead pacemaker is again noted. IMPRESSION: Interstitial prominence and increased density at the lung bases, which could reflect edema or pneumonia in the appropriate setting. Electronically Signed   By: Guadlupe SpanishPraneil  Patel M.D.   On: 02/22/2019 10:58   DG Chest 2 View  Result Date: 02/21/2019 CLINICAL DATA:  Hallucinations, altered mental status, fever. COPD. EXAM: CHEST - 2 VIEW COMPARISON:  07/02/2018 FINDINGS: Dual lead pacer in place. Prior CABG. Atherosclerotic calcification of the aortic arch. Stable scarring in the  left mid lung and lung bases. Mild enlargement of the cardiopericardial silhouette, without edema. The lungs appear otherwise clear. IMPRESSION: 1. Stable scarring in the left mid lung and lung bases. 2. Mild enlargement of the cardiopericardial silhouette, without edema. Electronically Signed   By: Gaylyn RongWalter  Liebkemann M.D.   On: 02/21/2019 18:06   CT ANGIO CHEST PE W OR WO CONTRAST  Result Date: 02/22/2019 CLINICAL DATA:  Fever and elevated D-dimer EXAM: CT ANGIOGRAPHY CHEST WITH CONTRAST TECHNIQUE: Multidetector CT imaging of the chest was performed using the standard protocol during bolus administration of intravenous contrast. Multiplanar CT image reconstructions and MIPs were obtained to evaluate the vascular anatomy. CONTRAST:  100mL OMNIPAQUE IOHEXOL 350 MG/ML SOLN COMPARISON:  CT chest December 22, 2015 FINDINGS: Cardiovascular: There is a optimal opacification of the pulmonary arteries. There is no central,segmental, or subsegmental filling defects within the pulmonary arteries. There is moderate cardiomegaly. A left-sided AICD seen with the lead tips in the right atrium and right ventricle. Mitral valve and coronary artery calcifications are seen. Overlying median sternotomy wires. There is normal three-vessel brachiocephalic anatomy without proximal stenosis. The thoracic aorta is normal in appearance. Scattered aortic atherosclerotic calcifications are seen without aneurysmal dilatation. Mediastinum/Nodes: No hilar, mediastinal, or axillary adenopathy. Thyroid gland, trachea, and esophagus demonstrate no significant findings. Lungs/Pleura: There are stable areas of streaky scarring seen throughout both lungs. There is a new patchy airspace opacity seen within the posterior right upper lobe. There is also a rounded patchy airspace opacity at the periphery of the left lung base. No large area of consolidation or pleural effusion. Upper Abdomen: No acute abnormalities present in the visualized portions  of the upper abdomen. Musculoskeletal: No chest wall abnormality. No acute or significant osseous findings. There is unchanged compression deformity of the T9 and T12 vertebral bodies are seen with 50-75% loss of vertebral body height. There is slight superior compression  deformity of the T11 vertebral body with less than 25% loss in height, with slight interval progression since the prior exam. No retropulsion of fragments is seen. There is buckling of the T12 posterior cortex as on prior exam. Review of the MIP images confirms the above findings. IMPRESSION: No central, segmental, or subsegmental pulmonary embolism. New small areas of patchy airspace opacity at the left lung base and posterior right upper lung. These are nonspecific could be due to atelectasis and/or early multifocal infection. Stable areas of streaky scarring. Moderate cardiomegaly Chronic compression deformities of the thoracic spine as described above. Electronically Signed   By: Jonna Clark M.D.   On: 02/22/2019 03:45   ECHOCARDIOGRAM COMPLETE  Result Date: 02/22/2019   ECHOCARDIOGRAM REPORT   Patient Name:   Gabrielle [name] Date of Exam: 02/22/2019 Medical Rec #:  [id number]          Height:       62.0 in Accession #:    [id number]         Weight:       197.0 lb Date of Birth:  29-Aug-1945          BSA:          1.90 m Patient Age:    73 years           BP:           181/78 mmHg Patient Gender: F                  HR:           74 bpm. Exam Location:  Inpatient Procedure: 2D Echo, Color Doppler, Cardiac Doppler and Intracardiac            Opacification Agent Indications:    I51.7 Cardiomegaly  History:        Patient has prior history of Echocardiogram examinations, most                 recent 05/17/2016. CHF, Pacemaker, COPD, Arrythmias:Atrial                 Fibrillation; Risk Factors:Dyslipidemia and Hypertension.  Sonographer:    Irving Burton Senior RDCS Referring Phys: [id number] ANASTASSIA DOUTOVA  Sonographer Comments: Technically difficult  study due to poor echo windows. Patient supine on CPAP. IMPRESSIONS  1. Left ventricular ejection fraction, by visual estimation, is 50 to 55%. The left ventricle has normal function. There is no left ventricular hypertrophy.  2. Left ventricular diastolic parameters are consistent with Grade II diastolic dysfunction (pseudonormalization).  3. Mildly dilated left ventricular internal cavity size.  4. The left ventricle demonstrates regional wall motion abnormalities.  5. Global right ventricle was not well visualized.The right ventricular size is not well visualized. Right vetricular wall thickness was not assessed.  6. Left atrial size was severely dilated.  7. Right atrial size was not well visualized.  8. Moderate mitral annular calcification.  9. The mitral valve is normal in structure. Mild mitral valve regurgitation. No evidence of mitral stenosis. 10. The tricuspid valve is not well visualized. 11. The aortic valve is tricuspid. Aortic valve regurgitation is not visualized. Mild aortic valve sclerosis without stenosis. 12. The pulmonic valve was not well visualized. Pulmonic valve regurgitation is trivial. 13. Mildly elevated pulmonary artery systolic pressure. 14. The inferior vena cava is normal in size with greater than 50% respiratory variability, suggesting right atrial pressure of 3 mmHg. 15. Technically difficult; definity used; mild apical hypokinesis; overall preserved LV function;  grade 2 diastolic dysfunction; mild LVE; severe LAE; mild MR; right heart not well visualized. FINDINGS  Left Ventricle: Left ventricular ejection fraction, by visual estimation, is 50 to 55%. The left ventricle has normal function. The left ventricle demonstrates regional wall motion abnormalities. The left ventricular internal cavity size was mildly dilated left ventricle. There is no left ventricular hypertrophy. Left ventricular diastolic parameters are consistent with Grade II diastolic dysfunction  (pseudonormalization). Normal left atrial pressure. Right Ventricle: The right ventricular size is not well visualized.Global RV systolic function is was not well visualized. The tricuspid regurgitant velocity is 2.70 m/s, and with an assumed right atrial pressure of 3 mmHg, the estimated right ventricular systolic pressure is mildly elevated at 32.2 mmHg. Left Atrium: Left atrial size was severely dilated. Right Atrium: Right atrial size was not well visualized Pericardium: There is no evidence of pericardial effusion. Mitral Valve: The mitral valve is normal in structure. Moderate mitral annular calcification. Mild mitral valve regurgitation. No evidence of mitral valve stenosis by observation. MV peak gradient, 10.9 mmHg. Tricuspid Valve: The tricuspid valve is not well visualized. Tricuspid valve regurgitation is trivial. Aortic Valve: The aortic valve is tricuspid. Aortic valve regurgitation is not visualized. Mild aortic valve sclerosis is present, with no evidence of aortic valve stenosis. Pulmonic Valve: The pulmonic valve was not well visualized. Pulmonic valve regurgitation is trivial. Pulmonic regurgitation is trivial. Aorta: The aortic root is normal in size and structure. Venous: The inferior vena cava is normal in size with greater than 50% respiratory variability, suggesting right atrial pressure of 3 mmHg.  Additional Comments: Technically difficult; definity used; mild apical hypokinesis; overall preserved LV function; grade 2 diastolic dysfunction; mild LVE; severe LAE; mild MR; right heart not well visualized.  LEFT VENTRICLE PLAX 2D LVIDd:         5.50 cm LVIDs:         2.90 cm LV PW:         1.00 cm LV IVS:        1.00 cm LVOT diam:     2.00 cm LV SV:         115 ml LV SV Index:   57.04 LVOT Area:     3.14 cm  RIGHT VENTRICLE RV S prime:     8.49 cm/s TAPSE (M-mode): 2.3 cm LEFT ATRIUM             Index LA diam:        4.60 cm 2.42 cm/m LA Vol (A2C):   95.4 ml 50.22 ml/m LA Vol (A4C):   80.1  ml 42.17 ml/m LA Biplane Vol: 88.0 ml 46.33 ml/m  AORTIC VALVE LVOT Vmax:   97.90 cm/s LVOT Vmean:  55.700 cm/s LVOT VTI:    0.164 m  AORTA Ao Root diam: 2.80 cm Ao Asc diam:  3.30 cm MITRAL VALVE            TRICUSPID VALVE MV Peak grad: 10.9 mmHg TR Peak grad:   29.2 mmHg MV Mean grad: 4.0 mmHg  TR Vmax:        270.00 cm/s MV Vmax:      1.65 m/s MV Vmean:     98.6 cm/s SHUNTS MV VTI:       0.35 m    Systemic VTI:  0.16 m                         Systemic Diam: 2.00 cm  Olga Millers MD Electronically  signed by Olga Millers MD Signature Date/Time: 02/22/2019/12:22:46 PM    Final         Scheduled Meds: . atorvastatin  20 mg Oral q1800  . budesonide (PULMICORT) nebulizer solution  0.25 mg Nebulization BID  . chlorhexidine  15 mL Mouth Rinse BID  . Chlorhexidine Gluconate Cloth  6 each Topical Daily  . citalopram  20 mg Oral Daily  . diltiazem  120 mg Oral Daily  . enoxaparin (LOVENOX) injection  40 mg Subcutaneous Q24H  . furosemide  40 mg Intravenous Q12H  . ipratropium-albuterol  3 mL Nebulization TID  . losartan  50 mg Oral Daily  . mouth rinse  15 mL Mouth Rinse q12n4p  . methylPREDNISolone (SOLU-MEDROL) injection  40 mg Intravenous Q12H  . metoprolol tartrate  50 mg Oral QHS   Continuous Infusions: . sodium chloride Stopped (02/23/19 1020)  . azithromycin Stopped (02/23/19 0943)  . cefTRIAXone (ROCEPHIN)  IV Stopped (02/23/19 0537)     LOS: 1 day    Time spent: 35 mins.More than 50% of that time was spent in counseling and/or coordination of care.      Burnadette Pop, MD [facility] Pager [phone]  If 7PM-7AM, please contact night-coverage [email or web address] Password [email or web address] 02/23/2019, 1:08 PM

## 2019-02-23 NOTE — Progress Notes (Signed)
Report had been called for patient transfer to telemetry. Pt went to the restroom prior to transport and went into Afib up to the 130's. Dr. Tawanna Solo paged and made aware. Order for transfer cancelled, Duonebs discontinued, and Dr. Tawanna Solo stated that if HR sustained greater than the 120's he would start Cardizem gtt. Currently sustaining in the 110's. Continuing to monitor.

## 2019-02-23 NOTE — Progress Notes (Signed)
OT Cancellation Note  Patient Details Name: Gabrielle [name] MRN: [id number] DOB: 09/23/1945   Cancelled Treatment:    Reason Eval/Treat Not Completed: Other (comment). Pt just came off bipap this am.  RN wants [place] to wait and see how she does. Will try to return later, if schedule permits.  Perri Aragones 02/23/2019, 9:38 AM  Karsten Ro, OTR/L Acute Rehabilitation Services 02/23/2019

## 2019-02-23 NOTE — Evaluation (Signed)
Occupational Therapy Evaluation Patient Details Name: Gabrielle [name] MRN: [id number] DOB: 1946-01-13 Today's Date: 02/23/2019    History of Present Illness 73 year old female with history of COPD, hyperlipidemia, depression, GERD, tachybradycardia syndrome, coronary disease,, and systolic/diastolic CHF , lower GI bleed, A. fib not on anticoagulation due to his recurrent GI bleed who presented with hallucinations   Clinical Impression   Pt was admitted for the above. At baseline, she reports she is independent with basic adls and her twin sister helps with getting food and doing laundry. Pt was just weaned from bipap earlier today.  Performed SPT with min guard assist. She needs min guard to min A for adls. Will follow in acute setting with supervision level goals.    Follow Up Recommendations  Supervision/Assistance - 24 hour;SNF;Home health OT(depending upon progress)    Equipment Recommendations  (to be further assessed)    Recommendations for Other Services       Precautions / Restrictions Precautions Precautions: Other (comment) Precaution Comments: monitor O2 Restrictions Weight Bearing Restrictions: No      Mobility Bed Mobility Overal bed mobility: Needs Assistance Bed Mobility: Supine to Sit     Supine to sit: Min assist     General bed mobility comments: HOB up, min A to raise trunk  Transfers Overall transfer level: Needs assistance Equipment used: Rolling walker (2 wheeled) Transfers: Sit to/from [facility] Sit to Stand: Min guard Stand pivot transfers: Min guard       General transfer comment: pt took several pivotal steps from bed to recliner; SaO2 90% on room air at rest, 94% on 2L O2 at rest    Balance Overall balance assessment: Modified Independent                                         ADL either performed or assessed with clinical judgement   ADL Overall ADL's : Needs assistance/impaired                         Toilet Transfer: Min guard;Stand-pivot;RW(chair)             General ADL Comments: based on clinical judgment, pt needs set up for UB adls and min guard to min A for LB adls.  She was weaned from bipap today and just got up to chair for her lunch     Vision         Perception     Praxis      Pertinent Vitals/Pain Pain Assessment: No/denies pain     Hand Dominance Right   Extremity/Trunk Assessment Upper Extremity Assessment Upper Extremity Assessment: Overall WFL for tasks assessed      Cervical / Trunk Assessment Cervical / Trunk Assessment: Normal   Communication Communication Communication: No difficulties   Cognition Arousal/Alertness: Awake/alert Behavior During Therapy: WFL for tasks assessed/performed Overall Cognitive Status: Within Functional Limits for tasks assessed                                     General Comments  weaned off bipap today;on 2 liters 02    Exercises     Shoulder Instructions      Home Living Family/patient expects to be discharged to:: Private residence Living Arrangements: Alone Available Help at Discharge: Family;Available PRN/intermittently  Home Access: Stairs to enter Entrance Stairs-Number of Steps: 4 Entrance Stairs-Rails: Right Home Layout: Laundry or work area in basement;Able to live on main level with bedroom/bathroom     Bathroom Shower/Tub: Producer, television/film/video: Standard     Home Equipment: Environmental consultant - 2 wheels   Additional Comments: bathroom is close to bedroom      Prior Functioning/Environment Level of Independence: Independent with assistive device(s)        Comments: family assists with laundry and shopping, pt reports she "holds on to the walls" walking inside and takes RW when going out, sister comes to assist prn        OT Problem List: Decreased strength;Decreased activity tolerance;Decreased knowledge of use of DME or AE;Cardiopulmonary  status limiting activity      OT Treatment/Interventions: Self-care/ADL training;Energy conservation;DME and/or AE instruction;Patient/family education;Therapeutic activities    OT Goals(Current goals can be found in the care plan section) Acute Rehab OT Goals Patient Stated Goal: get stronger and go see grandkids OT Goal Formulation: With patient Time For Goal Achievement: 03/09/19 Potential to Achieve Goals: Good ADL Goals Pt Will Transfer to Toilet: with modified independence;ambulating;regular height toilet;bedside commode Pt Will Perform Toileting - Clothing Manipulation and hygiene: with modified independence Pt Will Perform Tub/Shower Transfer: Shower transfer;with supervision;ambulating;shower seat Additional ADL Goal #1: pt will gather clothes at supervision level and complete adl without assistance, initiating at least one rest break without cues  OT Frequency: Min 2X/week   Barriers to D/C:            Co-evaluation PT/OT/SLP Co-Evaluation/Treatment: Yes Reason for Co-Treatment: To address functional/ADL transfers PT goals addressed during session: Mobility/safety with mobility OT goals addressed during session: ADL's and self-care      AM-PAC OT "6 Clicks" Daily Activity     Outcome Measure Help from another person eating meals?: None Help from another person taking care of personal grooming?: A Little Help from another person toileting, which includes using toliet, bedpan, or urinal?: A Little Help from another person bathing (including washing, rinsing, drying)?: A Little Help from another person to put on and taking off regular upper body clothing?: A Little Help from another person to put on and taking off regular lower body clothing?: A Little 6 Click Score: 19   End of Session    Activity Tolerance: Patient limited by fatigue Patient left: in chair;with call bell/phone within reach;with chair alarm set  OT Visit Diagnosis: Muscle weakness (generalized)  (M62.81)                Time: [id number] OT Time Calculation (min): 16 min Charges:  OT General Charges $OT Visit: 1 Visit OT Evaluation $OT Eval Low Complexity: 1 Low  Eliska Hamil S, OTR/L Acute Rehabilitation Services 02/23/2019  Carneshia Raker 02/23/2019, 3:05 PM

## 2019-02-24 LAB — BASIC METABOLIC PANEL
Anion gap: 16 — ABNORMAL HIGH (ref 5–15)
BUN: 31 mg/dL — ABNORMAL HIGH (ref 8–23)
CO2: 30 mmol/L (ref 22–32)
Calcium: 9.7 mg/dL (ref 8.9–10.3)
Chloride: 95 mmol/L — ABNORMAL LOW (ref 98–111)
Creatinine, Ser: 1.08 mg/dL — ABNORMAL HIGH (ref 0.44–1.00)
GFR calc Af Amer: 58 mL/min — ABNORMAL LOW (ref >60)
GFR calc non Af Amer: 50 mL/min — ABNORMAL LOW (ref >60)
Glucose, Bld: 174 mg/dL — ABNORMAL HIGH (ref 70–99)
Potassium: 3.1 mmol/L — ABNORMAL LOW (ref 3.5–5.1)
Sodium: 141 mmol/L (ref 135–145)

## 2019-02-24 LAB — CBC WITH DIFFERENTIAL/PLATELET
Abs Immature Granulocytes: 0.09 10*3/uL — ABNORMAL HIGH (ref 0.00–0.07)
Basophils Absolute: 0 10*3/uL (ref 0.0–0.1)
Basophils Relative: 0 %
Eosinophils Absolute: 0 10*3/uL (ref 0.0–0.5)
Eosinophils Relative: 0 %
HCT: 41.9 % (ref 36.0–46.0)
Hemoglobin: 12.4 g/dL (ref 12.0–15.0)
Immature Granulocytes: 1 %
Lymphocytes Relative: 6 %
Lymphs Abs: 0.8 10*3/uL (ref 0.7–4.0)
MCH: 25.9 pg — ABNORMAL LOW (ref 26.0–34.0)
MCHC: 29.6 g/dL — ABNORMAL LOW (ref 30.0–36.0)
MCV: 87.5 fL (ref 80.0–100.0)
Monocytes Absolute: 0.4 10*3/uL (ref 0.1–1.0)
Monocytes Relative: 3 %
Neutro Abs: 13.5 10*3/uL — ABNORMAL HIGH (ref 1.7–7.7)
Neutrophils Relative %: 90 %
Platelets: 227 10*3/uL (ref 150–400)
RBC: 4.79 MIL/uL (ref 3.87–5.11)
RDW: 15.1 % (ref 11.5–15.5)
WBC: 14.8 10*3/uL — ABNORMAL HIGH (ref 4.0–10.5)
nRBC: 0 % (ref 0.0–0.2)

## 2019-02-24 LAB — MAGNESIUM: Magnesium: 2.1 mg/dL (ref 1.7–2.4)

## 2019-02-24 MED ORDER — GUAIFENESIN-DM 100-10 MG/5ML PO SYRP
5.0000 mL | ORAL_SOLUTION | ORAL | Status: DC | PRN
  Administered 2019-02-24: 21:00:00 5 mL via ORAL
  Filled 2019-02-24: qty 10

## 2019-02-24 MED ORDER — CEFDINIR 300 MG PO CAPS
300.0000 mg | ORAL_CAPSULE | Freq: Two times a day (BID) | ORAL | Status: AC
  Administered 2019-02-25 – 2019-02-26 (×4): 300 mg via ORAL
  Filled 2019-02-24 (×5): qty 1

## 2019-02-24 MED ORDER — POTASSIUM CHLORIDE CRYS ER 20 MEQ PO TBCR: 20.0000 meq | EXTENDED_RELEASE_TABLET | Freq: Two times a day (BID) | ORAL | Status: DC

## 2019-02-24 MED ORDER — POTASSIUM CHLORIDE CRYS ER 20 MEQ PO TBCR
40.0000 meq | EXTENDED_RELEASE_TABLET | ORAL | Status: AC
  Administered 2019-02-24 (×2): 40 meq via ORAL
  Filled 2019-02-24 (×2): qty 2

## 2019-02-24 MED ORDER — FUROSEMIDE 40 MG PO TABS
40.0000 mg | ORAL_TABLET | Freq: Every day | ORAL | Status: DC
  Administered 2019-02-24 – 2019-03-01 (×6): 40 mg via ORAL
  Filled 2019-02-24 (×6): qty 1

## 2019-02-24 MED ORDER — METOPROLOL TARTRATE 50 MG PO TABS
100.0000 mg | ORAL_TABLET | Freq: Every day | ORAL | Status: DC
  Administered 2019-02-24 – 2019-02-28 (×5): 100 mg via ORAL
  Filled 2019-02-24: qty 4
  Filled 2019-02-24: qty 2
  Filled 2019-02-24 (×3): qty 4

## 2019-02-24 NOTE — Progress Notes (Signed)
PROGRESS NOTE    Gabrielle [name]  WUJ:[id number] DOB: [birth year] DOA: 02/21/2019 PCP: Henrine Screws, MD   Brief Narrative:   Patient is a 73 year old female with history of COPD, hyperlipidemia, depression, GERD, tachybradycardia syndrome, coronary disease,, and systolic/diastolic CHF , lower GI bleed, A. fib not on anticoagulation due to his recurrent GI bleed who presents with hallucinations.  She also had decreased p.o. intake.  She was very confused at home.  Family was concerned about urinary tract infection.  She was also febrile at home.  Patient found to be hypoxic on arrival.  Patient went into acute respiratory distress in the ED.  ABG showed hypercarbia.  Started on BiPAP, IV diuresis, IV steroids.  She was also noted to be hypotensive.  Admitted for management of acute hypoxic respiratory failure secondary to CHF/COPD exacerbation with possible pneumonia.  Hospital course remarkable for development of A. fib with RVR.   Assessment & Plan:   Active Problems:   Hyperlipidemia   Atrial fibrillation with controlled ventricular response (HCC)   Hypoxia   SIRS (systemic inflammatory response syndrome) (HCC)   Acute metabolic encephalopathy   Acute respiratory failure (HCC)   AKI (acute kidney injury) (HCC)   Acute hypoxic respiratory failure:  Most likely this is from acute on chronic CHF/COPD exacerbation versus pneumonia.Had  to put on BiPAP on presentation.  No evidence of PE as per CT angio.  Currently off BiPAP and is on nasal cannula on 3 L of oxygen per minute. Will check if she qualifies for home oxygen.  Suspected pneumonia: CT chest showed new small areas of patchy airspace opacity at the left lung base and posterior right upper lung.  Started on antibiotics for possible community-acquired pneumonia.  Covid test 19 test negative.  Lactic acid normal.  Procalcitonin non reassuring.  Abx changed to oral.  Acute on chronic combined CHF: Has history of combined CHF.   On diuretics at home.  Echocardiogram showed ejection fraction of 50 to 55%, grade 2 diastolic dysfunction.  Started on IV Lasix .  She had crackles on bilateral lung bases.  She had significant diuresis during this hospitalization.  I will change Lasix to oral.  Altered mental status: She was hallucinating and confused at home.  She does not have any history of mental disorders as per her son.  She has history of anxiety/depression and is on medications.  Her altered mental status could be secondary to hypoxia.  She is completely alert and oriented  Hypertensive urgency: Still hypertensive this morning.Increased the dose of metoprolol.Added losartan.  On cardizem at home  COPD exacerbation: Wheezing on examination on presentation.  Started on steroids.  She is on Symbicort at home.  Continue IV steroids for today.She still has wheezes  AKI: Resolved  Paroxysmal A. Fib/history of tachybradycardia syndrome: Currently  rate is controlled.  Status post pacemaker.  Continue Cardizem, metoprolol.  Not on anticoagulation due to history of GI bleed  Hyperlipidemia: Continue statin.  Anxiety: On Xanax at home.  On Celexa.  Leukocytosis: Improving  Hypokalemia: Being supplemented with potassium.  History of coronary artery disease: Status post CABG in 2010.  Currently stable.          DVT prophylaxis:Lovenox Code Status: Full Family Communication: Called son on phone on 02/22/2019  Disposition Plan: Home in 1 to 2 days.. Waiting for physical therapy evaluation.   Consultants: None  Procedures: None  Antimicrobials:  Anti-infectives (From admission, onward)   Start     Dose/Rate Route Frequency  Ordered Stop   02/25/19 1000  cefdinir (OMNICEF) capsule 300 mg     300 mg Oral Every 12 hours 02/24/19 0839 02/27/19 0959   02/22/19 0100  azithromycin (ZITHROMAX) 500 mg in sodium chloride 0.9 % 250 mL IVPB  Status:  Discontinued     500 mg 250 mL/hr over 60 Minutes Intravenous Daily  02/22/19 0021 02/24/19 0839   02/22/19 0045  cefTRIAXone (ROCEPHIN) 2 g in sodium chloride 0.9 % 100 mL IVPB  Status:  Discontinued     2 g 200 mL/hr over 30 Minutes Intravenous Daily 02/22/19 0021 02/24/19 0839      Subjective:   Patient seen and examined the bedside this morning.  Feels much better today.  Still on supplemental oxygen.  She had expiratory wheezes on auscultation.  Edema has significantly improved.  Heart rate is still in the range of 110s.  Hypertensive.  Eager to go home.  Objective: Vitals:   02/24/19 0600 02/24/19 0630 02/24/19 0750 02/24/19 0800  BP:    (!) 168/115  Pulse: 67 88  70  Resp: (!) 22 (!) 26  (!) 24  Temp:    97.8 F (36.6 C)  TempSrc:    Oral  SpO2: 97% 97% 98% 97%  Weight:      Height:        Intake/Output Summary (Last 24 hours) at 02/24/2019 1107 Last data filed at 02/24/2019 0300 Gross per 24 hour  Intake 344.83 ml  Output 1950 ml  Net -1605.17 ml   Filed Weights   02/21/19 1625  Weight: 89.4 kg    Examination:  General exam: Morbidly obese, not in distress HEENT:PERRL Ear/Nose normal on gross exam Respiratory system: Bilateral decreased air entry, bilateral wheezing Cardiovascular system: Afib , No JVD, murmurs, rubs, gallops or clicks.  Gastrointestinal system: Abdomen is nondistended, soft and nontender. No organomegaly or masses felt. Normal bowel sounds heard. Central nervous system: Alert and oriented Extremities:No edema, no clubbing ,no cyanosis, distal peripheral pulses palpable. Skin: No rashes, lesions or ulcers,no icterus ,no pallor    Data Reviewed: I have personally reviewed following labs and imaging studies  CBC: Recent Labs  Lab 02/21/19 1755 02/22/19 0730 02/24/19 0202  WBC 14.1* 18.9* 14.8*  NEUTROABS 11.0*  --  13.5*  HGB 12.1 11.9* 12.4  HCT 41.1 40.9 41.9  MCV 89.5 90.7 87.5  PLT 178 247 227   Basic Metabolic Panel: Recent Labs  Lab 02/21/19 1755 02/22/19 0730 02/24/19 0202  NA 140  141 141  K 4.3 4.6 3.1*  CL 103 105 95*  CO2 27 26 30   GLUCOSE 120* 140* 174*  BUN 14 13 31*  CREATININE 1.05* 0.91 1.08*  CALCIUM 9.7 9.6 9.7  MG  --  2.3 2.1  PHOS  --  3.7  --    GFR: Estimated Creatinine Clearance: 46.8 mL/min (A) (by C-G formula based on SCr of 1.08 mg/dL (H)). Liver Function Tests: Recent Labs  Lab 02/21/19 2212 02/22/19 0730  AST 19 17  ALT 12 12  ALKPHOS 56 60  BILITOT 0.7 1.0  PROT 7.6 8.3*  ALBUMIN 4.4 4.4   No results for input(s): LIPASE, AMYLASE in the last 168 hours. Recent Labs  Lab 02/22/19 0107  AMMONIA 26   Coagulation Profile: No results for input(s): INR, PROTIME in the last 168 hours. Cardiac Enzymes: No results for input(s): CKTOTAL, CKMB, CKMBINDEX, TROPONINI in the last 168 hours. BNP (last 3 results) No results for input(s): PROBNP in the last 8760  hours. HbA1C: No results for input(s): HGBA1C in the last 72 hours. CBG: No results for input(s): GLUCAP in the last 168 hours. Lipid Profile: Recent Labs    02/21/19 2212  TRIG 82   Thyroid Function Tests: Recent Labs    02/22/19 0731  TSH 1.688   Anemia Panel: Recent Labs    02/21/19 2212  FERRITIN 9*   Sepsis Labs: Recent Labs  Lab 02/21/19 2018 02/21/19 2212 02/21/19 2307  PROCALCITON  --  0.13  --   LATICACIDVEN 1.2  --  1.0    Recent Results (from the past 240 hour(s))  Urine culture     Status: Abnormal   Collection Time: 02/21/19  6:10 PM   Specimen: Urine, Random  Result Value Ref Range Status   Specimen Description   Final    URINE, RANDOM Performed at [facility] [address], [place], [place] [address]    Special Requests   Final    NONE Performed at [facility], [place] [address], [place], [place] [address]    Culture MULTIPLE SPECIES PRESENT, SUGGEST RECOLLECTION (A)  Final   Report Status 02/23/2019 FINAL  Final  SARS CORONAVIRUS 2 (TAT 6-24 HRS) Nasopharyngeal Nasopharyngeal Swab     Status:  None   Collection Time: 02/21/19  6:15 PM   Specimen: Nasopharyngeal Swab  Result Value Ref Range Status   SARS Coronavirus 2 NEGATIVE NEGATIVE Final    Comment: (NOTE) SARS-CoV-2 target nucleic acids are NOT DETECTED. The SARS-CoV-2 RNA is generally detectable in upper and lower respiratory specimens during the acute phase of infection. Negative results do not preclude SARS-CoV-2 infection, do not rule out co-infections with other pathogens, and should not be used as the sole basis for treatment or other patient management decisions. Negative results must be combined with clinical observations, patient history, and epidemiological information. The expected result is Negative. Fact Sheet for Patients: SugarRoll.be Fact Sheet for Healthcare Providers: https://www.woods-mathews.com/ This test is not yet approved or cleared by the [place] FDA and  has been authorized for detection and/or diagnosis of SARS-CoV-2 by FDA under an Emergency Use Authorization (EUA). This EUA will remain  in effect (meaning this test can be used) for the duration of the COVID-19 declaration under Section 56 4(b)(1) of the Act, 21 U.S.C. section 360bbb-3(b)(1), unless the authorization is terminated or revoked sooner. Performed at [facility], [place] [address] [place], [place] [address]   Blood culture (routine x 2)     Status: None (Preliminary result)   Collection Time: 02/21/19  8:18 PM   Specimen: BLOOD  Result Value Ref Range Status   Specimen Description   Final    BLOOD RIGHT ANTECUBITAL Performed at [facility] [address], [place], [place] [address]    Special Requests   Final    BOTTLES DRAWN AEROBIC AND ANAEROBIC Blood Culture adequate volume Performed at [facility] [address] [place], [place] [address]    Culture   Final    NO GROWTH 3 DAYS Performed at [facility], [place]  [address] [place], [place] [address]    Report Status PENDING  Incomplete  Blood culture (routine x 2)     Status: None (Preliminary result)   Collection Time: 02/21/19 11:07 PM   Specimen: BLOOD  Result Value Ref Range Status   Specimen Description   Final    BLOOD RIGHT ANTECUBITAL Performed at [facility]  [address] [place], [place] [address]    Special Requests   Final    BOTTLES DRAWN AEROBIC AND ANAEROBIC Blood Culture results may not be optimal due to an excessive volume of blood received in culture bottles Performed at [facility], [address] [place], [place] [address]    Culture   Final    NO GROWTH 2 DAYS Performed at [facility], [address] [place], [place] [address]    Report Status PENDING  Incomplete  MRSA PCR Screening     Status: None   Collection Time: 02/22/19 11:24 AM   Specimen: Nasal Mucosa; Nasopharyngeal  Result Value Ref Range Status   MRSA by PCR NEGATIVE NEGATIVE Final    Comment:        The GeneXpert MRSA Assay (FDA approved for NASAL specimens only), is one component of a comprehensive MRSA colonization surveillance program. It is not intended to diagnose MRSA infection nor to guide or monitor treatment for MRSA infections. Performed at [facility], [address] [place], [place] [address]          Radiology Studies: No results found.      Scheduled Meds: . atorvastatin  20 mg Oral q1800  . [START ON 02/25/2019] cefdinir  300 mg Oral Q12H  . chlorhexidine  15 mL Mouth Rinse BID  . Chlorhexidine Gluconate Cloth  6 each Topical Daily  . citalopram  20 mg Oral Daily  . enoxaparin (LOVENOX) injection  40 mg Subcutaneous Q24H  . furosemide  40 mg Oral Daily  . losartan  50 mg Oral Daily  . mouth rinse  15 mL Mouth Rinse q12n4p  . methylPREDNISolone (SOLU-MEDROL) injection  40 mg Intravenous Q12H  . metoprolol tartrate  100 mg Oral QHS  . potassium chloride   40 mEq Oral Q4H   Continuous Infusions: . sodium chloride Stopped (02/23/19 1020)  . diltiazem (CARDIZEM) infusion 10 mg/hr (02/24/19 0313)     LOS: 2 days    Time spent: 35 mins.More than 50% of that time was spent in counseling and/or coordination of care.      Burnadette PopAmrit Carlos Quackenbush, MD [facility] Pager [phone]  If 7PM-7AM, please contact night-coverage [email or web address] Password [email or web address] 02/24/2019, 11:07 AM

## 2019-02-24 NOTE — Progress Notes (Signed)
K 3.1 updated [employer] via text page.  Orders received for PO K in the AM with lab recheck to follow

## 2019-02-25 LAB — CBC WITH DIFFERENTIAL/PLATELET
Abs Immature Granulocytes: 0.18 10*3/uL — ABNORMAL HIGH (ref 0.00–0.07)
Basophils Absolute: 0 10*3/uL (ref 0.0–0.1)
Basophils Relative: 0 %
Eosinophils Absolute: 0 10*3/uL (ref 0.0–0.5)
Eosinophils Relative: 0 %
HCT: 43.3 % (ref 36.0–46.0)
Hemoglobin: 12.6 g/dL (ref 12.0–15.0)
Immature Granulocytes: 1 %
Lymphocytes Relative: 6 %
Lymphs Abs: 1.1 10*3/uL (ref 0.7–4.0)
MCH: 26.4 pg (ref 26.0–34.0)
MCHC: 29.1 g/dL — ABNORMAL LOW (ref 30.0–36.0)
MCV: 90.6 fL (ref 80.0–100.0)
Monocytes Absolute: 0.5 10*3/uL (ref 0.1–1.0)
Monocytes Relative: 3 %
Neutro Abs: 16.8 10*3/uL — ABNORMAL HIGH (ref 1.7–7.7)
Neutrophils Relative %: 90 %
Platelets: 264 10*3/uL (ref 150–400)
RBC: 4.78 MIL/uL (ref 3.87–5.11)
RDW: 15.3 % (ref 11.5–15.5)
WBC: 18.6 10*3/uL — ABNORMAL HIGH (ref 4.0–10.5)
nRBC: 0 % (ref 0.0–0.2)

## 2019-02-25 LAB — BASIC METABOLIC PANEL
Anion gap: 13 (ref 5–15)
BUN: 37 mg/dL — ABNORMAL HIGH (ref 8–23)
CO2: 30 mmol/L (ref 22–32)
Calcium: 9.8 mg/dL (ref 8.9–10.3)
Chloride: 99 mmol/L (ref 98–111)
Creatinine, Ser: 1.14 mg/dL — ABNORMAL HIGH (ref 0.44–1.00)
GFR calc Af Amer: 54 mL/min — ABNORMAL LOW (ref >60)
GFR calc non Af Amer: 47 mL/min — ABNORMAL LOW (ref >60)
Glucose, Bld: 174 mg/dL — ABNORMAL HIGH (ref 70–99)
Potassium: 4.3 mmol/L (ref 3.5–5.1)
Sodium: 142 mmol/L (ref 135–145)

## 2019-02-25 MED ORDER — MOMETASONE FURO-FORMOTEROL FUM 200-5 MCG/ACT IN AERO
2.0000 | INHALATION_SPRAY | Freq: Two times a day (BID) | RESPIRATORY_TRACT | Status: DC
  Administered 2019-02-25 – 2019-03-01 (×8): 2 via RESPIRATORY_TRACT
  Filled 2019-02-25: qty 8.8

## 2019-02-25 MED ORDER — PREDNISONE 20 MG PO TABS
40.0000 mg | ORAL_TABLET | Freq: Every day | ORAL | Status: DC
  Administered 2019-02-26 – 2019-03-01 (×4): 40 mg via ORAL
  Filled 2019-02-25: qty 4
  Filled 2019-02-25 (×3): qty 2

## 2019-02-25 MED ORDER — DILTIAZEM HCL ER COATED BEADS 180 MG PO CP24
360.0000 mg | ORAL_CAPSULE | Freq: Every day | ORAL | Status: DC
  Administered 2019-02-25 – 2019-03-01 (×5): 360 mg via ORAL
  Filled 2019-02-25 (×5): qty 2

## 2019-02-25 NOTE — Progress Notes (Addendum)
Attempted calling report earlier, but RN busy with another pt's transfer. RN to call back when she is done with task. VWilliams,RN.

## 2019-02-25 NOTE — Progress Notes (Addendum)
PROGRESS NOTE    Gabrielle [name]  ZOX:[id number] DOB: June 19, 1945 DOA: 02/21/2019 PCP: Henrine Screwshacker, Robert, MD   Brief Narrative:   Patient is a 73 year old female with history of COPD, hyperlipidemia, depression, GERD, tachybradycardia syndrome, coronary disease,, and systolic/diastolic CHF , lower GI bleed, A. fib not on anticoagulation due to his recurrent GI bleed who presents with hallucinations.  She also had decreased p.o. intake.  She was very confused at home.  Family was concerned about urinary tract infection.  She was also febrile at home.  Patient found to be hypoxic on arrival.  Patient went into acute respiratory distress in the ED.  ABG showed hypercarbia.  Started on BiPAP, IV diuresis, IV steroids.  She was also noted to be hypertensive.  Admitted for management of acute hypoxic respiratory failure secondary to CHF/COPD exacerbation with possible pneumonia.  Hospital course remarkable for development of A. fib with RVR.Plan is to transfer her to floor today.Overall status gradually improving. Stable for transfer to floor today. Discharge planning in 1 to 2 days   Assessment & Plan:   Active Problems:   Hyperlipidemia   Atrial fibrillation with controlled ventricular response (HCC)   Hypoxia   SIRS (systemic inflammatory response syndrome) (HCC)   Acute metabolic encephalopathy   Acute respiratory failure (HCC)   AKI (acute kidney injury) (HCC)   Acute hypoxic respiratory failure:  Most likely this is from acute on chronic CHF/COPD exacerbation versus pneumonia.Had  to put on BiPAP on presentation.  No evidence of PE as per CT angio.  Currently off BiPAP and is on nasal cannula on 3 L of oxygen per minute. Will check if she qualifies for home oxygen.She might need home oxygen  on discharge.  Suspected pneumonia: CT chest showed new small areas of patchy airspace opacity at the left lung base and posterior right upper lung.  Started on antibiotics for possible  community-acquired pneumonia.  Covid test 19 test negative.  Lactic acid normal.  Procalcitonin non reassuring.  Abx changed to oral.  Acute on chronic combined CHF: Has history of combined CHF.  On diuretics at home.  Echocardiogram showed ejection fraction of 50 to 55%, grade 2 diastolic dysfunction.  Started on IV Lasix .  She had crackles on bilateral lung bases.  She had significant diuresis during this hospitalization. Changed Lasix to oral.  Altered mental status: She was hallucinating and confused at home.  She does not have any history of mental disorders as per her son.  She has history of anxiety/depression and is on medications.  Her altered mental status could be secondary to hypoxia.  She is completely alert and oriented  Hypertensive urgency: Still hypertensive this morning.Increased the dose of metoprolol.  Increased the dose of home Cardizem   COPD exacerbation: Wheezing on examination on presentation.  Started on IV steroids.  She is on Symbicort at home.  Will change prednisone from tomorrow.  AKI: Resolved  Paroxysmal A. Fib/history of tachybradycardia syndrome: Currently  rate is controlled.  Status post pacemaker.  Continue Cardizem , metoprolol.  Not on anticoagulation due to history of GI bleed  Hyperlipidemia: Continue statin.  Anxiety: On Xanax at home.  On Celexa.  Leukocytosis: Likely associated with steroids.  Continue to monitor the trend.  Hypokalemia: Supplemented with potassium.  History of coronary artery disease: Status post CABG in 2010.  Currently stable.          DVT prophylaxis:Lovenox Code Status: Full Family Communication: Called son on phone on 02/22/2019.Called sister on  02/23/2019.  Disposition Plan:  Waiting for final physical therapy recommendation.  Hypertension ,heart rate more controlled today.  Still has some wheezes.  Needs 1-2 more days of inpatient stay.  Might need oxygen on discharge.   Consultants: None  Procedures:  None  Antimicrobials:  Anti-infectives (From admission, onward)   Start     Dose/Rate Route Frequency Ordered Stop   02/25/19 1000  cefdinir (OMNICEF) capsule 300 mg     300 mg Oral Every 12 hours 02/24/19 0839 02/27/19 0959   02/22/19 0100  azithromycin (ZITHROMAX) 500 mg in sodium chloride 0.9 % 250 mL IVPB  Status:  Discontinued     500 mg 250 mL/hr over 60 Minutes Intravenous Daily 02/22/19 0021 02/24/19 0839   02/22/19 0045  cefTRIAXone (ROCEPHIN) 2 g in sodium chloride 0.9 % 100 mL IVPB  Status:  Discontinued     2 g 200 mL/hr over 30 Minutes Intravenous Daily 02/22/19 0021 02/24/19 0839      Subjective:   Patient seen and examined the bedside this morning.  A. fib is more controlled today.  She feels much better today but still has some mild bilateral expiratory wheezes.  Looks euvolemic at present.  Eager to go home.  Will transfer to floor today.  Objective: Vitals:   02/25/19 1200 02/25/19 1312 02/25/19 1315 02/25/19 1421  BP: (!) 158/76 119/88  (!) 109/50  Pulse: 61 (!) 44 65 67  Resp: (!) 24 (!) 26 19 (!) 23  Temp:      TempSrc:      SpO2:  92% 96% 91%  Weight:      Height:        Intake/Output Summary (Last 24 hours) at 02/25/2019 1425 Last data filed at 02/25/2019 0600 Gross per 24 hour  Intake 385.87 ml  Output 300 ml  Net 85.87 ml   Filed Weights   02/21/19 1625  Weight: 89.4 kg    Examination:  General exam: Morbidly obese, not in distress HEENT:PERRL Ear/Nose normal on gross exam Respiratory system: Bilateral decreased air entry, bilateral mild wheezing Cardiovascular system: Afib , No JVD, murmurs, rubs, gallops or clicks.  Gastrointestinal system: Abdomen is nondistended, soft and nontender. No organomegaly or masses felt. Normal bowel sounds heard. Central nervous system: Alert and oriented Extremities:No edema, no clubbing ,no cyanosis, distal peripheral pulses palpable. Skin: No rashes, lesions or ulcers,no icterus ,no pallor    Data  Reviewed: I have personally reviewed following labs and imaging studies  CBC: Recent Labs  Lab 02/21/19 1755 02/22/19 0730 02/24/19 0202 02/25/19 0206  WBC 14.1* 18.9* 14.8* 18.6*  NEUTROABS 11.0*  --  13.5* 16.8*  HGB 12.1 11.9* 12.4 12.6  HCT 41.1 40.9 41.9 43.3  MCV 89.5 90.7 87.5 90.6  PLT 178 247 227 264   Basic Metabolic Panel: Recent Labs  Lab 02/21/19 1755 02/22/19 0730 02/24/19 0202 02/25/19 0206  NA 140 141 141 142  K 4.3 4.6 3.1* 4.3  CL 103 105 95* 99  CO2 GLUCOSE 120* 140* 174* 174*  BUN 14 13 31* 37*  CREATININE 1.05* 0.91 1.08* 1.14*  CALCIUM 9.7 9.6 9.7 9.8  MG  --  2.3 2.1  --   PHOS  --  3.7  --   --    GFR: Estimated Creatinine Clearance: 44.3 mL/min (A) (by C-G formula based on SCr of 1.14 mg/dL (H)). Liver Function Tests: Recent Labs  Lab 02/21/19 2212 02/22/19 0730  AST 19 17  ALT  12 12  ALKPHOS 56 60  BILITOT 0.7 1.0  PROT 7.6 8.3*  ALBUMIN 4.4 4.4   No results for input(s): LIPASE, AMYLASE in the last 168 hours. Recent Labs  Lab 02/22/19 0107  AMMONIA 26   Coagulation Profile: No results for input(s): INR, PROTIME in the last 168 hours. Cardiac Enzymes: No results for input(s): CKTOTAL, CKMB, CKMBINDEX, TROPONINI in the last 168 hours. BNP (last 3 results) No results for input(s): PROBNP in the last 8760 hours. HbA1C: No results for input(s): HGBA1C in the last 72 hours. CBG: No results for input(s): GLUCAP in the last 168 hours. Lipid Profile: No results for input(s): CHOL, HDL, LDLCALC, TRIG, CHOLHDL, LDLDIRECT in the last 72 hours. Thyroid Function Tests: No results for input(s): TSH, T4TOTAL, FREET4, T3FREE, THYROIDAB in the last 72 hours. Anemia Panel: No results for input(s): VITAMINB12, FOLATE, FERRITIN, TIBC, IRON, RETICCTPCT in the last 72 hours. Sepsis Labs: Recent Labs  Lab 02/21/19 2018 02/21/19 2212 02/21/19 2307  PROCALCITON  --  0.13  --   LATICACIDVEN 1.2  --  1.0    Recent Results  (from the past 240 hour(s))  Urine culture     Status: Abnormal   Collection Time: 02/21/19  6:10 PM   Specimen: Urine, Random  Result Value Ref Range Status   Specimen Description   Final    URINE, RANDOM Performed at [facility] [address], [place], [place] [address]    Special Requests   Final    NONE Performed at [facility], [place] [address], [place], [place] [address]    Culture MULTIPLE SPECIES PRESENT, SUGGEST RECOLLECTION (A)  Final   Report Status 02/23/2019 FINAL  Final  SARS CORONAVIRUS 2 (TAT 6-24 HRS) Nasopharyngeal Nasopharyngeal Swab     Status: None   Collection Time: 02/21/19  6:15 PM   Specimen: Nasopharyngeal Swab  Result Value Ref Range Status   SARS Coronavirus 2 NEGATIVE NEGATIVE Final    Comment: (NOTE) SARS-CoV-2 target nucleic acids are NOT DETECTED. The SARS-CoV-2 RNA is generally detectable in upper and lower respiratory specimens during the acute phase of infection. Negative results do not preclude SARS-CoV-2 infection, do not rule out co-infections with other pathogens, and should not be used as the sole basis for treatment or other patient management decisions. Negative results must be combined with clinical observations, patient history, and epidemiological information. The expected result is Negative. Fact Sheet for Patients: SugarRoll.be Fact Sheet for Healthcare Providers: https://www.woods-mathews.com/ This test is not yet approved or cleared by the [place] FDA and  has been authorized for detection and/or diagnosis of SARS-CoV-2 by FDA under an Emergency Use Authorization (EUA). This EUA will remain  in effect (meaning this test can be used) for the duration of the COVID-19 declaration under Section 56 4(b)(1) of the Act, 21 U.S.C. section 360bbb-3(b)(1), unless the authorization is terminated or revoked sooner. Performed at [facility],  [place] [address] [place], [place] [address]   Blood culture (routine x 2)     Status: None (Preliminary result)   Collection Time: 02/21/19  8:18 PM   Specimen: BLOOD  Result Value Ref Range Status   Specimen Description   Final    BLOOD RIGHT ANTECUBITAL Performed at [facility] [address], [place], [place] [address]    Special Requests   Final    BOTTLES DRAWN AEROBIC AND ANAEROBIC Blood Culture adequate volume Performed at [facility]  [address] [place], [place] [address]    Culture   Final    NO GROWTH 4 DAYS Performed at [facility], [address], [place], [place] [address]    Report Status PENDING  Incomplete  Blood culture (routine x 2)     Status: None (Preliminary result)   Collection Time: 02/21/19 11:07 PM   Specimen: BLOOD  Result Value Ref Range Status   Specimen Description   Final    BLOOD RIGHT ANTECUBITAL Performed at [facility], [address] [place], [place] [address]    Special Requests   Final    BOTTLES DRAWN AEROBIC AND ANAEROBIC Blood Culture results may not be optimal due to an excessive volume of blood received in culture bottles Performed at [facility], [address] [place], [place] [address]    Culture   Final    NO GROWTH 3 DAYS Performed at [facility], [address], [place], [place] [address]    Report Status PENDING  Incomplete  MRSA PCR Screening     Status: None   Collection Time: 02/22/19 11:24 AM   Specimen: Nasal Mucosa; Nasopharyngeal  Result Value Ref Range Status   MRSA by PCR NEGATIVE NEGATIVE Final    Comment:        The GeneXpert MRSA Assay (FDA approved for NASAL specimens only), is one component of a comprehensive MRSA colonization surveillance program. It is not intended to diagnose MRSA infection nor to guide or monitor treatment for MRSA infections. Performed at [facility], [address], [place], [place] [address]          Radiology Studies: No results found.      Scheduled Meds: . atorvastatin  20 mg Oral q1800  . cefdinir  300 mg Oral Q12H  . chlorhexidine  15 mL Mouth Rinse BID  . Chlorhexidine Gluconate Cloth  6 each Topical Daily  . citalopram  20 mg Oral Daily  . diltiazem  360 mg Oral Daily  . enoxaparin (LOVENOX) injection  40 mg Subcutaneous Q24H  . furosemide  40 mg Oral Daily  . losartan  50 mg Oral Daily  . mouth rinse  15 mL Mouth Rinse q12n4p  . methylPREDNISolone (SOLU-MEDROL) injection  40 mg Intravenous Q12H  . metoprolol tartrate  100 mg Oral QHS   Continuous Infusions: . sodium chloride Stopped (02/25/19 0214)     LOS: 3 days    Time spent: 35 mins.More than 50% of that time was spent in counseling and/or coordination of care.      Burnadette Pop, MD [facility] Pager [phone]  If 7PM-7AM, please contact night-coverage [email or web address] Password [email or web address] 02/25/2019, 2:25 PM

## 2019-02-25 NOTE — Progress Notes (Signed)
Pt currently off BiPAP on 2L Lydia tolerating well at this time. RT will continue to monitor pt status.

## 2019-02-25 NOTE — Progress Notes (Signed)
Pt has arrived from ICU, initiated telebox#[id number] , on O2 @ 2lnc

## 2019-02-25 NOTE — Progress Notes (Signed)
Writer received report from ICU nurse.

## 2019-02-25 NOTE — Progress Notes (Signed)
Report called and given to Lanny Cramp on 5th floor and pt transferred to floor. left unit on bed pushed by this writer and night NT. Arrived in stable condition. VWilliams,RN.

## 2019-02-26 LAB — CBC WITH DIFFERENTIAL/PLATELET
Abs Immature Granulocytes: 0.14 10*3/uL — ABNORMAL HIGH (ref 0.00–0.07)
Basophils Absolute: 0 10*3/uL (ref 0.0–0.1)
Basophils Relative: 0 %
Eosinophils Absolute: 0 10*3/uL (ref 0.0–0.5)
Eosinophils Relative: 0 %
HCT: 42.4 % (ref 36.0–46.0)
Hemoglobin: 12.4 g/dL (ref 12.0–15.0)
Immature Granulocytes: 1 %
Lymphocytes Relative: 9 %
Lymphs Abs: 1.4 10*3/uL (ref 0.7–4.0)
MCH: 26 pg (ref 26.0–34.0)
MCHC: 29.2 g/dL — ABNORMAL LOW (ref 30.0–36.0)
MCV: 88.9 fL (ref 80.0–100.0)
Monocytes Absolute: 1 10*3/uL (ref 0.1–1.0)
Monocytes Relative: 7 %
Neutro Abs: 12.8 10*3/uL — ABNORMAL HIGH (ref 1.7–7.7)
Neutrophils Relative %: 83 %
Platelets: 217 10*3/uL (ref 150–400)
RBC: 4.77 MIL/uL (ref 3.87–5.11)
RDW: 15.1 % (ref 11.5–15.5)
WBC: 15.3 10*3/uL — ABNORMAL HIGH (ref 4.0–10.5)
nRBC: 0 % (ref 0.0–0.2)

## 2019-02-26 LAB — BASIC METABOLIC PANEL
Anion gap: 16 — ABNORMAL HIGH (ref 5–15)
BUN: 32 mg/dL — ABNORMAL HIGH (ref 8–23)
CO2: 30 mmol/L (ref 22–32)
Calcium: 9.3 mg/dL (ref 8.9–10.3)
Chloride: 93 mmol/L — ABNORMAL LOW (ref 98–111)
Creatinine, Ser: 0.94 mg/dL (ref 0.44–1.00)
GFR calc Af Amer: >60 mL/min (ref >60)
GFR calc non Af Amer: 59 mL/min — ABNORMAL LOW (ref >60)
Glucose, Bld: 143 mg/dL — ABNORMAL HIGH (ref 70–99)
Potassium: 3.7 mmol/L (ref 3.5–5.1)
Sodium: 139 mmol/L (ref 135–145)

## 2019-02-26 LAB — BLOOD GAS, ARTERIAL
Acid-Base Excess: 8.6 mmol/L — ABNORMAL HIGH (ref 0.0–2.0)
Bicarbonate: 34.7 mmol/L — ABNORMAL HIGH (ref 20.0–28.0)
O2 Saturation: 94.3 %
Patient temperature: 98.6
pCO2 arterial: 56.5 mmHg — ABNORMAL HIGH (ref 32.0–48.0)
pH, Arterial: 7.405 (ref 7.350–7.450)
pO2, Arterial: 74.5 mmHg — ABNORMAL LOW (ref 83.0–108.0)

## 2019-02-26 LAB — CULTURE, BLOOD (ROUTINE X 2)
Culture: NO GROWTH
Special Requests: ADEQUATE

## 2019-02-26 MED ORDER — ALPRAZOLAM 0.5 MG PO TABS
0.5000 mg | ORAL_TABLET | Freq: Every day | ORAL | Status: DC
  Administered 2019-02-26: 23:00:00 0.5 mg via ORAL
  Filled 2019-02-26: qty 1

## 2019-02-26 NOTE — Progress Notes (Signed)
PROGRESS NOTE  Gabrielle [name]:[id number] DOB: 02/07/1946 DOA: 02/21/2019 PCP: Henrine Screws, MD   LOS: 4 days   Brief Narrative / Interim history: 73 year old female with COPD, HLD, depression, GERD, tachybradycardia syndrome, CAD, chronic combined systolic and diastolic CHF, prior recurrent lower GI bleed, A. fib not on anticoagulation who came to the hospital and was admitted on 02/21/2019 with hallucinations and confusions at home.  She was also febrile and family was concerned for UTI.  She was hypoxic and hypercarbic and was started on BiPAP, IV diuresis and IV steroids and admitted to stepdown.  She had an episode of A. fib with RVR.  She has been on antibiotics as well.  Overall improving and was transferred to the floor on 12/26  Subjective / 24h Interval events: Alert, no significant complaints this morning.  No shortness of breath, no chest pain, no abdominal pain, nausea or vomiting  Assessment & Plan: Principal Problem Acute hypoxic respiratory failure -Likely multifactorial due to acute on chronic CHF/COPD exacerbation versus pneumonia -Patient with a CT angiogram which was negative for PE -Overall improving, may need home oxygen on discharge, will determine the next 1 to 2 days -2D echo as below with recovered EF 50-55%, grade 2 DD  Active Problems Acute metabolic encephalopathy -Patient was hallucinating and was quite confused at home, she has good insight into her hallucinations and denies having any recently, however bedside RN mentioned that she heard the patient talking to herself in the room -Overall seems to be improving  Suspected pneumonia -CT of the chest showed new small areas of patchy airspace opacities at the left lung base and posterior right upper lung, she was started on antibiotics, SARS-CoV-2 was negative. -Antibiotics were changed to oral, will need total of 7 days  COPD exacerbation -Significant wheezing was present on admission, she was on  IV steroids and this was changed to prednisone.  She still has very faint wheezing this morning which I suspect is close to baseline -Continue prednisone, will do a slow taper  Acute kidney injury -Resolved, creatinine has normalized  Paroxysmal A. fib/history of tachybradycardia syndrome -Pacemaker in place, currently rate controlled.  Continue Cardizem, metoprolol.  Not anticoagulation due to prior GI bleed  Hypertension -Continue current regimen  Hyperlipidemia -Continue statin  Anxiety -Continue home medications  History of CAD -No chest pain, status post CABG in 2010, stable  Leukocytosis -Due to steroids  Scheduled Meds: . atorvastatin  20 mg Oral q1800  . cefdinir  300 mg Oral Q12H  . chlorhexidine  15 mL Mouth Rinse BID  . Chlorhexidine Gluconate Cloth  6 each Topical Daily  . citalopram  20 mg Oral Daily  . diltiazem  360 mg Oral Daily  . enoxaparin (LOVENOX) injection  40 mg Subcutaneous Q24H  . furosemide  40 mg Oral Daily  . mouth rinse  15 mL Mouth Rinse q12n4p  . metoprolol tartrate  100 mg Oral QHS  . mometasone-formoterol  2 puff Inhalation BID  . predniSONE  40 mg Oral Q breakfast   Continuous Infusions: . sodium chloride Stopped (02/25/19 0214)   PRN Meds:.sodium chloride, acetaminophen **OR** acetaminophen, guaiFENesin-dextromethorphan, HYDROcodone-acetaminophen, labetalol, levalbuterol, ondansetron **OR** ondansetron (ZOFRAN) IV  DVT prophylaxis: Lovenox Code Status: Full code Family Communication: no family at bedside  Disposition Plan: home when ready   Consultants:  PCCM  Procedures:  2D echo:  Sonographer Comments: Technically difficult study due to poor echo windows. Patient supine on CPAP. IMPRESSIONS   1. Left ventricular ejection fraction,  by visual estimation, is 50 to 55%. The left ventricle has normal function. There is no left ventricular hypertrophy.  2. Left ventricular diastolic parameters are consistent with Grade II  diastolic dysfunction (pseudonormalization).  3. Mildly dilated left ventricular internal cavity size.  4. The left ventricle demonstrates regional wall motion abnormalities.  5. Global right ventricle was not well visualized.The right ventricular size is not well visualized. Right vetricular wall thickness was not assessed.  6. Left atrial size was severely dilated.  7. Right atrial size was not well visualized.  8. Moderate mitral annular calcification.  9. The mitral valve is normal in structure. Mild mitral valve regurgitation. No evidence of mitral stenosis. 10. The tricuspid valve is not well visualized. 11. The aortic valve is tricuspid. Aortic valve regurgitation is not visualized. Mild aortic valve sclerosis without stenosis. 12. The pulmonic valve was not well visualized. Pulmonic valve regurgitation is trivial. 13. Mildly elevated pulmonary artery systolic pressure. 14. The inferior vena cava is normal in size with greater than 50% respiratory variability, suggesting right atrial pressure of 3 mmHg. 15. Technically difficult; definity used; mild apical hypokinesis; overall preserved LV function; grade 2 diastolic dysfunction; mild LVE; severe LAE; mild MR; right heart not well visualized.  Microbiology  Blood cultures 12/22-no growth Urine culture 12/22-multiple species MRSA PCR-negative 12/22 SARS-CoV-2-negative  Antimicrobials: Ceftriaxone/azithromycin 12/23-12/26 Cefdinir 12/26-plan to stop on 12/29   Objective: Vitals:   02/25/19 2114 02/26/19 0011 02/26/19 0548 02/26/19 0824  BP:  139/73 (!) 125/100   Pulse:  70 75   Resp:  20 17   Temp:   97.8 F (36.6 C)   TempSrc:   Oral   SpO2: 96% 97% (!) 86% 93%  Weight:      Height:        Intake/Output Summary (Last 24 hours) at 02/26/2019 1342 Last data filed at 02/26/2019 1251 Gross per 24 hour  Intake 480 ml  Output 275 ml  Net 205 ml   Filed Weights   02/21/19 1625  Weight: 89.4 kg     Examination:  Constitutional: NAD, alert and oriented x4 but very tangential speaking Eyes: no scleral icterus ENMT: Mucous membranes are moist.  Neck: normal, supple Respiratory: clear to auscultation bilaterally, faint end expiratory wheezing Cardiovascular: Regular rate and rhythm, no murmurs / rubs / gallops.  No peripheral edema Abdomen: non distended, no tenderness. Bowel sounds positive.  Musculoskeletal: no clubbing / cyanosis.  Skin: no rashes Neurologic: no focal deficits Psychiatric: Normal judgment and insight. Alert and oriented x 3. Normal mood.    Data Reviewed: I have independently reviewed following labs and imaging studies   CBC: Recent Labs  Lab 02/21/19 1755 02/22/19 0730 02/24/19 0202 02/25/19 0206 02/26/19 0602  WBC 14.1* 18.9* 14.8* 18.6* 15.3*  NEUTROABS 11.0*  --  13.5* 16.8* 12.8*  HGB 12.1 11.9* 12.4 12.6 12.4  HCT 41.1 40.9 41.9 43.3 42.4  MCV 89.5 90.7 87.5 90.6 88.9  PLT 178 247 227 264 217   Basic Metabolic Panel: Recent Labs  Lab 02/21/19 1755 02/22/19 0730 02/24/19 0202 02/25/19 0206 02/26/19 0602  NA 140 141 141 142 139  K 4.3 4.6 3.1* 4.3 3.7  CL 103 105 95* 99 93*  CO2 27 26 30 30 30   GLUCOSE 120* 140* 174* 174* 143*  BUN 14 13 31* 37* 32*  CREATININE 1.05* 0.91 1.08* 1.14* 0.94  CALCIUM 9.7 9.6 9.7 9.8 9.3  MG  --  2.3 2.1  --   --  PHOS  --  3.7  --   --   --    Liver Function Tests: Recent Labs  Lab 02/21/19 2212 02/22/19 0730  AST 19 17  ALT 12 12  ALKPHOS 56 60  BILITOT 0.7 1.0  PROT 7.6 8.3*  ALBUMIN 4.4 4.4   Coagulation Profile: No results for input(s): INR, PROTIME in the last 168 hours. HbA1C: No results for input(s): HGBA1C in the last 72 hours. CBG: No results for input(s): GLUCAP in the last 168 hours.  Recent Results (from the past 240 hour(s))  Urine culture     Status: Abnormal   Collection Time: 02/21/19  6:10 PM   Specimen: Urine, Random  Result Value Ref Range Status   Specimen  Description   Final    URINE, RANDOM Performed at [facility] [address] [place], [place] [address]    Special Requests   Final    NONE Performed at [facility], [place] [address] [place], [place] [address]    Culture MULTIPLE SPECIES PRESENT, SUGGEST RECOLLECTION (A)  Final   Report Status 02/23/2019 FINAL  Final  SARS CORONAVIRUS 2 (TAT 6-24 HRS) Nasopharyngeal Nasopharyngeal Swab     Status: None   Collection Time: 02/21/19  6:15 PM   Specimen: Nasopharyngeal Swab  Result Value Ref Range Status   SARS Coronavirus 2 NEGATIVE NEGATIVE Final    Comment: (NOTE) SARS-CoV-2 target nucleic acids are NOT DETECTED. The SARS-CoV-2 RNA is generally detectable in upper and lower respiratory specimens during the acute phase of infection. Negative results do not preclude SARS-CoV-2 infection, do not rule out co-infections with other pathogens, and should not be used as the sole basis for treatment or other patient management decisions. Negative results must be combined with clinical observations, patient history, and epidemiological information. The expected result is Negative. Fact Sheet for Patients: SugarRoll.be Fact Sheet for Healthcare Providers: https://www.woods-mathews.com/ This test is not yet approved or cleared by the [place] FDA and  has been authorized for detection and/or diagnosis of SARS-CoV-2 by FDA under an Emergency Use Authorization (EUA). This EUA will remain  in effect (meaning this test can be used) for the duration of the COVID-19 declaration under Section 56 4(b)(1) of the Act, 21 U.S.C. section 360bbb-3(b)(1), unless the authorization is terminated or revoked sooner. Performed at [facility], [place] [address], [place], [place] [address]   Blood culture (routine x 2)     Status: None   Collection Time: 02/21/19  8:18 PM   Specimen: BLOOD  Result Value Ref Range Status    Specimen Description   Final    BLOOD RIGHT ANTECUBITAL Performed at [facility] [address], [place], [place] [address]    Special Requests   Final    BOTTLES DRAWN AEROBIC AND ANAEROBIC Blood Culture adequate volume Performed at [facility] [address], [place], [place] [address]    Culture   Final    NO GROWTH 5 DAYS Performed at [facility], [place] [address], [place], [place] [address]    Report Status 02/26/2019 FINAL  Final  Blood culture (routine x 2)     Status: None (Preliminary result)   Collection Time: 02/21/19 11:07 PM   Specimen: BLOOD  Result Value Ref Range Status   Specimen Description   Final    BLOOD RIGHT ANTECUBITAL Performed at [facility] [address] [place],  [address]    Special  Requests   Final    BOTTLES DRAWN AEROBIC AND ANAEROBIC Blood Culture results may not be optimal due to an excessive volume of blood received in culture bottles Performed at [facility], [address] [place], [place] [address]    Culture   Final    NO GROWTH 4 DAYS Performed at [facility], [address] [place], [place] [address]    Report Status PENDING  Incomplete  MRSA PCR Screening     Status: None   Collection Time: 02/22/19 11:24 AM   Specimen: Nasal Mucosa; Nasopharyngeal  Result Value Ref Range Status   MRSA by PCR NEGATIVE NEGATIVE Final    Comment:        The GeneXpert MRSA Assay (FDA approved for NASAL specimens only), is one component of a comprehensive MRSA colonization surveillance program. It is not intended to diagnose MRSA infection nor to guide or monitor treatment for MRSA infections. Performed at [facility], [address] [place], [place] [address]      Radiology Studies: No results found.  Pamella Pertostin Ladeana Laplant, MD, PhD [facility]  Between 7 am - 7 pm I am available, please contact me via Amion or  Securechat  Between 7 pm - 7 am I am not available, please contact night coverage MD/APP via Amion

## 2019-02-27 ENCOUNTER — Ambulatory Visit (HOSPITAL_COMMUNITY)
Admit: 2019-02-27 | Discharge: 2019-02-27 | Disposition: A | Discharge status: Home / Self Care | Payer: Medicare Other | Attending: Internal Medicine | Admitting: Internal Medicine

## 2019-02-27 LAB — BLOOD GAS, ARTERIAL
Acid-Base Excess: 8.7 mmol/L — ABNORMAL HIGH (ref 0.0–2.0)
Bicarbonate: 35 mmol/L — ABNORMAL HIGH (ref 20.0–28.0)
Drawn by: 103701
FIO2: 28
O2 Content: 2 L/min
O2 Saturation: 95.7 %
Patient temperature: 98.6
pCO2 arterial: 56.5 mmHg — ABNORMAL HIGH (ref 32.0–48.0)
pH, Arterial: 7.408 (ref 7.350–7.450)
pO2, Arterial: 80.6 mmHg — ABNORMAL LOW (ref 83.0–108.0)

## 2019-02-27 LAB — CULTURE, BLOOD (ROUTINE X 2): Culture: NO GROWTH

## 2019-02-27 LAB — GLUCOSE, CAPILLARY: Glucose-Capillary: 284 mg/dL — ABNORMAL HIGH (ref 70–99)

## 2019-02-27 NOTE — Progress Notes (Signed)
RN spoke with Linette at [facility] to schedule a time for the patient to have her MRI done on the [facility] campus d/t the patient having a pacemaker. RN was told that they will have a [facility] RN call back to schedule the appointment for the patient.

## 2019-02-27 NOTE — Care Management Important Message (Signed)
Important Message  Patient Details IM Letter given to Kathrin Greathouse SW Case Manager to present to the Patient Name: Gabrielle [name] MRN: [id number] Date of Birth: [birth year]   Medicare Important Message Given:  Yes     Kerin Salen 02/27/2019, 11:18 AM

## 2019-02-27 NOTE — Progress Notes (Signed)
PT Cancellation Note  Patient Details Name: PRISILA DLOUHY MRN: [id number] DOB: 22-Apr-1945   Cancelled Treatment:     pt at [facility] for MRI.  Will attempt to see another day as schedule permits.     Rica Koyanagi  PTA Acute  Rehabilitation Services Pager      [phone] Office      [phone]

## 2019-02-27 NOTE — Plan of Care (Signed)
  Problem: Education: Goal: Knowledge of General Education information will improve Description: Including pain rating scale, medication(s)/side effects and non-pharmacologic comfort measures Outcome: Progressing   Problem: Clinical Measurements: Goal: Respiratory complications will improve Outcome: Progressing Goal: Cardiovascular complication will be avoided Outcome: Progressing   Problem: Activity: Goal: Risk for activity intolerance will decrease Outcome: Progressing   Problem: Coping: Goal: Level of anxiety will decrease Outcome: Progressing   Problem: Pain Managment: Goal: General experience of comfort will improve Outcome: Progressing   Problem: Safety: Goal: Ability to remain free from injury will improve Outcome: Progressing   

## 2019-02-27 NOTE — Progress Notes (Signed)
PROGRESS NOTE  Gabrielle [name] DXI:[id number] DOB: 1945-09-03 DOA: 02/21/2019 PCP: Henrine Screws, MD   LOS: 5 days   Brief Narrative / Interim history: 73 year old female with COPD, HLD, depression, GERD, tachybradycardia syndrome, CAD, chronic combined systolic and diastolic CHF, prior recurrent lower GI bleed, A. fib not on anticoagulation who came to the hospital and was admitted on 02/21/2019 with hallucinations and confusions at home.  She was also febrile and family was concerned for UTI.  She was hypoxic and hypercarbic and was started on BiPAP, IV diuresis and IV steroids and admitted to stepdown.  She had an episode of A. fib with RVR.  She has been on antibiotics as well.  Overall improving and was transferred to the floor on 12/26  Subjective / 24h Interval events: Quite sleepy this morning, wakes up and answers orientation questions but drifts very easy to sleep  Assessment & Plan: Principal Problem Acute hypoxic respiratory failure -Likely multifactorial due to acute on chronic CHF/COPD exacerbation versus pneumonia -Patient with a CT angiogram which was negative for PE -Overall improving, may need home oxygen on discharge, is to be determined when close to discharge -2D echo as below with recovered EF 50-55%, grade 2 DD  Active Problems Acute metabolic encephalopathy -Patient was hallucinating and was quite confused at home, she has good insight into her hallucinations and denies having any recently, however bedside RN mentioned that she heard the patient talking to herself in the room -Encephalopathy overall is improving, however still has intermittent hallucinations.  She is quite sleepy this morning but ABG is reassuring without significant hypercarbia -We will obtain MRI of the brain  Suspected pneumonia -CT of the chest showed new small areas of patchy airspace opacities at the left lung base and posterior right upper lung, she was started on antibiotics,  SARS-CoV-2 was negative. -Completed a 7-day course with last dose 12/27  COPD exacerbation -Significant wheezing was present on admission, she was on IV steroids and this was changed to prednisone.  She still has very faint wheezing this morning which I suspect is close to baseline -Continue prednisone  Acute kidney injury -Resolved, creatinine has normalized  Paroxysmal A. fib/history of tachybradycardia syndrome -Pacemaker in place, currently rate controlled.  Continue Cardizem, metoprolol.  Not anticoagulation due to prior GI bleed.  Hypertension -Continue current regimen, blood pressure stable  Hyperlipidemia -On statin  Anxiety -Continue home medications, added back her home Xanax last night she is too sleepy this morning and will discontinue  History of CAD -No chest pain, status post CABG in 2010, stable  Leukocytosis -Due to steroids  Scheduled Meds: . atorvastatin  20 mg Oral q1800  . chlorhexidine  15 mL Mouth Rinse BID  . Chlorhexidine Gluconate Cloth  6 each Topical Daily  . citalopram  20 mg Oral Daily  . diltiazem  360 mg Oral Daily  . enoxaparin (LOVENOX) injection  40 mg Subcutaneous Q24H  . furosemide  40 mg Oral Daily  . mouth rinse  15 mL Mouth Rinse q12n4p  . metoprolol tartrate  100 mg Oral QHS  . mometasone-formoterol  2 puff Inhalation BID  . predniSONE  40 mg Oral Q breakfast   Continuous Infusions: . sodium chloride Stopped (02/25/19 0214)   PRN Meds:.sodium chloride, acetaminophen **OR** acetaminophen, guaiFENesin-dextromethorphan, HYDROcodone-acetaminophen, labetalol, levalbuterol, ondansetron **OR** ondansetron (ZOFRAN) IV  DVT prophylaxis: Lovenox Code Status: Full code Family Communication: Discussed with sister at bedside Disposition Plan: home when ready   Consultants:  PCCM  Procedures:  2D  echo:  Sonographer Comments: Technically difficult study due to poor echo windows. Patient supine on CPAP. IMPRESSIONS   1. Left  ventricular ejection fraction, by visual estimation, is 50 to 55%. The left ventricle has normal function. There is no left ventricular hypertrophy.  2. Left ventricular diastolic parameters are consistent with Grade II diastolic dysfunction (pseudonormalization).  3. Mildly dilated left ventricular internal cavity size.  4. The left ventricle demonstrates regional wall motion abnormalities.  5. Global right ventricle was not well visualized.The right ventricular size is not well visualized. Right vetricular wall thickness was not assessed.  6. Left atrial size was severely dilated.  7. Right atrial size was not well visualized.  8. Moderate mitral annular calcification.  9. The mitral valve is normal in structure. Mild mitral valve regurgitation. No evidence of mitral stenosis. 10. The tricuspid valve is not well visualized. 11. The aortic valve is tricuspid. Aortic valve regurgitation is not visualized. Mild aortic valve sclerosis without stenosis. 12. The pulmonic valve was not well visualized. Pulmonic valve regurgitation is trivial. 13. Mildly elevated pulmonary artery systolic pressure. 14. The inferior vena cava is normal in size with greater than 50% respiratory variability, suggesting right atrial pressure of 3 mmHg. 15. Technically difficult; definity used; mild apical hypokinesis; overall preserved LV function; grade 2 diastolic dysfunction; mild LVE; severe LAE; mild MR; right heart not well visualized.  Microbiology  Blood cultures 12/22-no growth Urine culture 12/22-multiple species MRSA PCR-negative 12/22 SARS-CoV-2-negative  Antimicrobials: Ceftriaxone/azithromycin 12/23-12/26 Cefdinir 12/26-plan to stop on 12/29   Objective: Vitals:   02/26/19 0824 02/26/19 1942 02/27/19 0620 02/27/19 1104  BP:  134/82 (!) 149/93   Pulse:  83 62   Resp:  18 20   Temp:  98.1 F (36.7 C) 98.2 F (36.8 C)   TempSrc:      SpO2: 93% 95% 94% 97%  Weight:      Height:         Intake/Output Summary (Last 24 hours) at 02/27/2019 1325 Last data filed at 02/27/2019 [id number] Gross per 24 hour  Intake 840 ml  Output 400 ml  Net 440 ml   Filed Weights   02/21/19 1625  Weight: 89.4 kg    Examination:  Constitutional: NAD, sleepy Eyes: No icterus ENMT: mmm Neck: normal, supple Respiratory: Occasional faint end expiratory wheezing but overall clear, no crackles Cardiovascular: rrr, no edema Abdomen: Soft, NT, ND, positive bowel sounds Musculoskeletal: no clubbing / cyanosis.  Skin: No rashes seen Neurologic: No focal deficits, equal strength Psychiatric: Alert and oriented x3   Data Reviewed: I have independently reviewed following labs and imaging studies   CBC: Recent Labs  Lab 02/21/19 1755 02/22/19 0730 02/24/19 0202 02/25/19 0206 02/26/19 0602  WBC 14.1* 18.9* 14.8* 18.6* 15.3*  NEUTROABS 11.0*  --  13.5* 16.8* 12.8*  HGB 12.1 11.9* 12.4 12.6 12.4  HCT 41.1 40.9 41.9 43.3 42.4  MCV 89.5 90.7 87.5 90.6 88.9  PLT 178 247 227 264 217   Basic Metabolic Panel: Recent Labs  Lab 02/21/19 1755 02/22/19 0730 02/24/19 0202 02/25/19 0206 02/26/19 0602  NA 140 141 141 142 139  K 4.3 4.6 3.1* 4.3 3.7  CL 103 105 95* 99 93*  CO2 27 26 30 30 30   GLUCOSE 120* 140* 174* 174* 143*  BUN 14 13 31* 37* 32*  CREATININE 1.05* 0.91 1.08* 1.14* 0.94  CALCIUM 9.7 9.6 9.7 9.8 9.3  MG  --  2.3 2.1  --   --   PHOS  --  3.7  --   --   --    Liver Function Tests: Recent Labs  Lab 02/21/19 2212 02/22/19 0730  AST 19 17  ALT 12 12  ALKPHOS 56 60  BILITOT 0.7 1.0  PROT 7.6 8.3*  ALBUMIN 4.4 4.4   Coagulation Profile: No results for input(s): INR, PROTIME in the last 168 hours. HbA1C: No results for input(s): HGBA1C in the last 72 hours. CBG: Recent Labs  Lab 02/27/19 1146  GLUCAP 284*    Recent Results (from the past 240 hour(s))  Urine culture     Status: Abnormal   Collection Time: 02/21/19  6:10 PM   Specimen: Urine, Random  Result  Value Ref Range Status   Specimen Description   Final    URINE, RANDOM Performed at [facility], [address] [place], [place] [address]    Special Requests   Final    NONE Performed at [facility], [address], [place], [place] [address]    Culture MULTIPLE SPECIES PRESENT, SUGGEST RECOLLECTION (A)  Final   Report Status 02/23/2019 FINAL  Final  SARS CORONAVIRUS 2 (TAT 6-24 HRS) Nasopharyngeal Nasopharyngeal Swab     Status: None   Collection Time: 02/21/19  6:15 PM   Specimen: Nasopharyngeal Swab  Result Value Ref Range Status   SARS Coronavirus 2 NEGATIVE NEGATIVE Final    Comment: (NOTE) SARS-CoV-2 target nucleic acids are NOT DETECTED. The SARS-CoV-2 RNA is generally detectable in upper and lower respiratory specimens during the acute phase of infection. Negative results do not preclude SARS-CoV-2 infection, do not rule out co-infections with other pathogens, and should not be used as the sole basis for treatment or other patient management decisions. Negative results must be combined with clinical observations, patient history, and epidemiological information. The expected result is Negative. Fact Sheet for Patients: HairSlick.no Fact Sheet for Healthcare Providers: quierodirigir.com This test is not yet approved or cleared by the [place] FDA and  has been authorized for detection and/or diagnosis of SARS-CoV-2 by FDA under an Emergency Use Authorization (EUA). This EUA will remain  in effect (meaning this test can be used) for the duration of the COVID-19 declaration under Section 56 4(b)(1) of the Act, 21 U.S.C. section 360bbb-3(b)(1), unless the authorization is terminated or revoked sooner. Performed at [facility], [address], [place], [place] [address]   Blood culture (routine x 2)     Status: None   Collection Time: 02/21/19  8:18 PM   Specimen:  BLOOD  Result Value Ref Range Status   Specimen Description   Final    BLOOD RIGHT ANTECUBITAL Performed at [facility], [address] [place], [place] [address]    Special Requests   Final    BOTTLES DRAWN AEROBIC AND ANAEROBIC Blood Culture adequate volume Performed at [facility], [address], [place], [place] [address]    Culture   Final    NO GROWTH 5 DAYS Performed at [facility], [address], [place], [place] [address]    Report Status 02/26/2019 FINAL  Final  Blood culture (routine x 2)     Status: None (Preliminary result)   Collection Time: 02/21/19 11:07 PM   Specimen: BLOOD  Result Value Ref Range Status   Specimen Description   Final    BLOOD RIGHT ANTECUBITAL Performed at [facility], [address] [place], [place] [address]    Special Requests  Final    BOTTLES DRAWN AEROBIC AND ANAEROBIC Blood Culture results may not be optimal due to an excessive volume of blood received in culture bottles Performed at [facility] [address] [place], [place] [address]    Culture   Final    NO GROWTH 4 DAYS Performed at [facility], [place] [address] [place], [place] [address]    Report Status PENDING  Incomplete  MRSA PCR Screening     Status: None   Collection Time: 02/22/19 11:24 AM   Specimen: Nasal Mucosa; Nasopharyngeal  Result Value Ref Range Status   MRSA by PCR NEGATIVE NEGATIVE Final    Comment:        The GeneXpert MRSA Assay (FDA approved for NASAL specimens only), is one component of a comprehensive MRSA colonization surveillance program. It is not intended to diagnose MRSA infection nor to guide or monitor treatment for MRSA infections. Performed at [facility], [place] [address], [place], [place] [address]      Radiology Studies: No results found.  Marzetta Board, MD, PhD [facility]  Between 7 am - 7 pm I am  available, please contact me via Amion or Securechat  Between 7 pm - 7 am I am not available, please contact night coverage MD/APP via Amion

## 2019-02-28 LAB — CBC
HCT: 41.8 % (ref 36.0–46.0)
Hemoglobin: 12.4 g/dL (ref 12.0–15.0)
MCH: 26.1 pg (ref 26.0–34.0)
MCHC: 29.7 g/dL — ABNORMAL LOW (ref 30.0–36.0)
MCV: 87.8 fL (ref 80.0–100.0)
Platelets: 151 10*3/uL (ref 150–400)
RBC: 4.76 MIL/uL (ref 3.87–5.11)
RDW: 14.6 % (ref 11.5–15.5)
WBC: 13 10*3/uL — ABNORMAL HIGH (ref 4.0–10.5)
nRBC: 0 % (ref 0.0–0.2)

## 2019-02-28 LAB — COMPREHENSIVE METABOLIC PANEL
ALT: 20 U/L (ref 0–44)
AST: 26 U/L (ref 15–41)
Albumin: 3.3 g/dL — ABNORMAL LOW (ref 3.5–5.0)
Alkaline Phosphatase: 53 U/L (ref 38–126)
Anion gap: 14 (ref 5–15)
BUN: 25 mg/dL — ABNORMAL HIGH (ref 8–23)
CO2: 30 mmol/L (ref 22–32)
Calcium: 9.3 mg/dL (ref 8.9–10.3)
Chloride: 96 mmol/L — ABNORMAL LOW (ref 98–111)
Creatinine, Ser: 1.05 mg/dL — ABNORMAL HIGH (ref 0.44–1.00)
GFR calc Af Amer: >60 mL/min (ref >60)
GFR calc non Af Amer: 52 mL/min — ABNORMAL LOW (ref >60)
Glucose, Bld: 189 mg/dL — ABNORMAL HIGH (ref 70–99)
Potassium: 2.7 mmol/L — CL (ref 3.5–5.1)
Sodium: 140 mmol/L (ref 135–145)
Total Bilirubin: 0.9 mg/dL (ref 0.3–1.2)
Total Protein: 5.7 g/dL — ABNORMAL LOW (ref 6.5–8.1)

## 2019-02-28 MED ORDER — POTASSIUM CHLORIDE 20 MEQ/15ML (10%) PO SOLN
40.0000 meq | Freq: Once | ORAL | Status: AC
  Administered 2019-02-28: 12:00:00 40 meq via ORAL
  Filled 2019-02-28: qty 30

## 2019-02-28 MED ORDER — POTASSIUM CHLORIDE 10 MEQ/100ML IV SOLN: 10.0000 meq | INTRAVENOUS | Status: DC

## 2019-02-28 MED ORDER — POTASSIUM CHLORIDE CRYS ER 20 MEQ PO TBCR
40.0000 meq | EXTENDED_RELEASE_TABLET | Freq: Once | ORAL | Status: AC
  Administered 2019-02-28: 40 meq via ORAL
  Filled 2019-02-28: qty 2

## 2019-02-28 NOTE — Progress Notes (Signed)
Physical Therapy Treatment Patient Details Name: Gabrielle [name] MRN: [id number] DOB: 1945-12-27 Today's Date: 02/28/2019    History of Present Illness 73 year old female with history of COPD, hyperlipidemia, depression, GERD, tachybradycardia syndrome, coronary disease,, and systolic/diastolic CHF , lower GI bleed, A. fib not on anticoagulation due to his recurrent GI bleed who presents with hallucinations    PT Comments    Progressing with mobility.    Follow Up Recommendations  Home health PT;Supervision/Assistance - 24 hour     Equipment Recommendations  None recommended by PT    Recommendations for Other Services       Precautions / Restrictions Precautions Precautions: Fall Precaution Comments: monitor O2 Restrictions Weight Bearing Restrictions: No    Mobility  Bed Mobility               General bed mobility comments: oob in recliner  Transfers Overall transfer level: Needs assistance Equipment used: Rolling walker (2 wheeled) Transfers: Sit to/from Stand Sit to Stand: Min guard         General transfer comment: Close guard for safety. Vcs hand placement.  Ambulation/Gait Ambulation/Gait assistance: Min guard Gait Distance (Feet): 85 Feet Assistive device: Rolling walker (2 wheeled) Gait Pattern/deviations: Step-through pattern     General Gait Details: Close guard for safety. Dyspnea 3/4. O2 90% on RA. Pt was fatigued after short walk.   Stairs             Wheelchair Mobility    Modified Rankin (Stroke Patients Only)       Balance Overall balance assessment: Needs assistance         Standing balance support: Bilateral upper extremity supported Standing balance-Leahy Scale: Poor                              Cognition Arousal/Alertness: Awake/alert Behavior During Therapy: WFL for tasks assessed/performed Overall Cognitive Status: Within Functional Limits for tasks assessed                                         Exercises      General Comments        Pertinent Vitals/Pain Pain Assessment: No/denies pain    Home Living                      Prior Function            PT Goals (current goals can now be found in the care plan section) Progress towards PT goals: Progressing toward goals    Frequency    Min 3X/week      PT Plan Current plan remains appropriate    Co-evaluation              AM-PAC PT "6 Clicks" Mobility   Outcome Measure  Help needed turning from your back to your side while in a flat bed without using bedrails?: A Little Help needed moving from lying on your back to sitting on the side of a flat bed without using bedrails?: A Little Help needed moving to and from a bed to a chair (including a wheelchair)?: A Little Help needed standing up from a chair using your arms (e.g., wheelchair or bedside chair)?: A Little Help needed to walk in hospital room?: A Little Help needed climbing 3-5 steps with a railing? : A Little 6 Click  Score: 18    End of Session Equipment Utilized During Treatment: Gait belt Activity Tolerance: Patient limited by fatigue Patient left: in chair;with call bell/phone within reach;with chair alarm set   PT Visit Diagnosis: Difficulty in walking, not elsewhere classified (R26.2)     Time: 1010-1023 PT Time Calculation (min) (ACUTE ONLY): 13 min  Charges:  $Gait Training: 8-22 mins                         Faye Ramsay, PT Acute Rehabilitation

## 2019-02-28 NOTE — Progress Notes (Signed)
OT Cancellation Note  Patient Details Name: Gabrielle [name] MRN: [id number] DOB: 01-Jun-1945   Cancelled Treatment:    Reason Eval/Treat Not Completed: Other (comment) Awaiting K+ replacement; will try to check back later. [place], OTR/L Acute Rehabilitation Services 02/28/2019 02/28/2019, 9:04 AM

## 2019-02-28 NOTE — Progress Notes (Signed)
OT Cancellation Note  Patient Details Name: Gabrielle [name] MRN: [id number] DOB: 1945/04/19   Cancelled Treatment:     Checked back for OT this afternoon. Pt had just gotten back to bed and declined OOB again.   Sakara Lehtinen 02/28/2019, 2:34 PM  Karsten Ro, OTR/L Acute Rehabilitation Services 02/28/2019

## 2019-02-28 NOTE — Progress Notes (Addendum)
PROGRESS NOTE  Gabrielle [name] DVV:[id number] DOB: 1945/04/04 DOA: 02/21/2019 PCP: Henrine Screws, MD   LOS: 6 days   Brief Narrative / Interim history: 73 year old female with COPD, HLD, depression, GERD, tachybradycardia syndrome, CAD, chronic combined systolic and diastolic CHF, prior recurrent lower GI bleed, A. fib not on anticoagulation who came to the hospital and was admitted on 02/21/2019 with hallucinations and confusions at home.  She was also febrile and family was concerned for UTI.  She was hypoxic and hypercarbic and was started on BiPAP, IV diuresis and IV steroids and admitted to stepdown.  She had an episode of A. fib with RVR.  She has been on antibiotics as well.  Overall improving and was transferred to the floor on 12/26  Subjective / 24h Interval events: Alert this morning, denies any complaints.  Slept well.  Assessment & Plan: Principal Problem Acute hypoxic respiratory failure -Likely multifactorial due to acute on chronic CHF/COPD exacerbation versus pneumonia -Patient with a CT angiogram which was negative for PE -Improving, attempt to wean off oxygen today -2D echo as below with recovered EF 50-55%, grade 2 DD  Active Problems Acute metabolic encephalopathy -Patient was hallucinating and was quite confused at home, she has good insight into her hallucinations and denies having any recently, however bedside RN mentioned that she heard the patient talking to herself in the room -Patient was very confused and sleepy yesterday 12/28, underwent an MRI of the brain last night which was negative for acute findings but 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 -Completed a 7-day course with last dose 12/27  COPD exacerbation -Significant wheezing was present on admission, she was on IV steroids and this was changed to prednisone.  She still has very faint wheezing this morning which I suspect is close to baseline -Continue prednisone  Acute kidney injury -Resolved, creatinine has normalized  Paroxysmal A. fib/history of tachybradycardia syndrome -Pacemaker in place, currently rate controlled.  Continue Cardizem, metoprolol.  Not anticoagulation due to prior GI bleed.  Hypertension -Continue current regimen, blood pressure stable  Hyperlipidemia -On statin  Anxiety -She was given a trial of her home Xanax however that made her sleepy for 24 hours following that, we will hold that and avoid altogether.  Patient sister did tell me before that she may have been abusing Xanax at home.  History of CAD -No chest pain, status post CABG in 2010, stable  Leukocytosis -Due to steroids  Scheduled Meds: . atorvastatin  20 mg Oral q1800  . chlorhexidine  15 mL Mouth Rinse BID  . Chlorhexidine Gluconate Cloth  6 each Topical Daily  . citalopram  20 mg Oral Daily  . diltiazem  360 mg Oral Daily  . enoxaparin (LOVENOX) injection  40 mg Subcutaneous Q24H  . furosemide  40 mg Oral Daily  . mouth rinse  15 mL Mouth Rinse q12n4p  . metoprolol tartrate  100 mg Oral QHS  . mometasone-formoterol  2 puff Inhalation BID  . predniSONE  40 mg Oral Q breakfast   Continuous Infusions: . sodium chloride Stopped (02/25/19 0214)   PRN Meds:.sodium chloride, acetaminophen **  OR** acetaminophen, guaiFENesin-dextromethorphan, labetalol, levalbuterol, ondansetron **OR** ondansetron (ZOFRAN) IV  DVT prophylaxis: Lovenox Code Status: Full code Family Communication: Discussed with sister at bedside Disposition Plan: home in 24 hours if mental status remains clear she has no  further hallucinations  Consultants:  PCCM  Procedures:  2D echo:  Sonographer Comments: Technically difficult study due to poor echo windows. Patient supine on CPAP. IMPRESSIONS   1. Left ventricular ejection fraction, by visual estimation, is 50 to 55%. The left ventricle has normal function. There is no left ventricular hypertrophy.  2. Left ventricular diastolic parameters are consistent with Grade II diastolic dysfunction (pseudonormalization).  3. Mildly dilated left ventricular internal cavity size.  4. The left ventricle demonstrates regional wall motion abnormalities.  5. Global right ventricle was not well visualized.The right ventricular size is not well visualized. Right vetricular wall thickness was not assessed.  6. Left atrial size was severely dilated.  7. Right atrial size was not well visualized.  8. Moderate mitral annular calcification.  9. The mitral valve is normal in structure. Mild mitral valve regurgitation. No evidence of mitral stenosis. 10. The tricuspid valve is not well visualized. 11. The aortic valve is tricuspid. Aortic valve regurgitation is not visualized. Mild aortic valve sclerosis without stenosis. 12. The pulmonic valve was not well visualized. Pulmonic valve regurgitation is trivial. 13. Mildly elevated pulmonary artery systolic pressure. 14. The inferior vena cava is normal in size with greater than 50% respiratory variability, suggesting right atrial pressure of 3 mmHg. 15. Technically difficult; definity used; mild apical hypokinesis; overall preserved LV function; grade 2 diastolic dysfunction; mild LVE; severe LAE; mild MR; right heart not well visualized.  Microbiology  Blood cultures 12/22-no growth Urine culture 12/22-multiple species MRSA PCR-negative 12/22 SARS-CoV-2-negative  Antimicrobials: Ceftriaxone/azithromycin 12/23-12/26 Cefdinir 12/26-plan to stop on 12/29   Objective: Vitals:   02/27/19 2009 02/27/19 2036 02/27/19  2250 02/28/19 0443  BP:  (!) 145/72 131/78 122/88  Pulse:  69 71 60  Resp:  16  16  Temp:  97.7 F (36.5 C)  98 F (36.7 C)  TempSrc:  Oral  Oral  SpO2: 97% 96%  96%  Weight:      Height:        Intake/Output Summary (Last 24 hours) at 02/28/2019 1305 Last data filed at 02/27/2019 1330 Gross per 24 hour  Intake 240 ml  Output --  Net 240 ml   Filed Weights   02/21/19 1625  Weight: 89.4 kg    Examination:  Constitutional: No distress, alert Eyes: No scleral icterus ENMT: Moist mucous membranes Neck: normal, supple Respiratory: Intermittent end expiratory wheezing, stable Cardiovascular: Regular rate and rhythm, no edema Abdomen: Soft, nontender, nondistended, bowel sounds positive Musculoskeletal: no clubbing / cyanosis.  Skin: No rashes seen Neurologic: Nonfocal, equal strength Psychiatric: Alert and oriented x3   Data Reviewed: I have independently reviewed following labs and imaging studies   CBC: Recent Labs  Lab 02/21/19 1755 02/22/19 0730 02/24/19 0202 02/25/19 0206 02/26/19 0602 02/28/19 0550  WBC 14.1* 18.9* 14.8* 18.6* 15.3* 13.0*  NEUTROABS 11.0*  --  13.5* 16.8* 12.8*  --   HGB 12.1 11.9* 12.4 12.6 12.4 12.4  HCT 41.1 40.9 41.9 43.3 42.4 41.8  MCV 89.5 90.7 87.5 90.6 88.9 87.8  PLT 178 247 227 264 217 151   Basic Metabolic Panel: Recent Labs  Lab 02/22/19 0730 02/24/19 0202 02/25/19 0206 02/26/19 0602 02/28/19 0550  NA 141 141 142 139 140  K 4.6 3.1* 4.3 3.7 2.7*  CL 105 95* 99 93* 96*  CO2 GLUCOSE 140* 174* 174* 143* 189*  BUN 13 31* 37* 32* 25*  CREATININE 0.91 1.08* 1.14* 0.94 1.05*  CALCIUM 9.6 9.7 9.8 9.3 9.3  MG 2.3 2.1  --   --   --   PHOS 3.7  --   --   --   --    Liver Function Tests: Recent Labs  Lab 02/21/19 2212 02/22/19 0730 02/28/19 0550  AST ALT ALKPHOS 56 60 53  BILITOT 0.7 1.0 0.9  PROT 7.6 8.3* 5.7*  ALBUMIN 4.4 4.4 3.3*   Coagulation Profile: No results for  input(s): INR, PROTIME in the last 168 hours. HbA1C: No results for input(s): HGBA1C in the last 72 hours. CBG: Recent Labs  Lab 02/27/19 1146  GLUCAP 284*    Recent Results (from the past 240 hour(s))  Urine culture     Status: Abnormal   Collection Time: 02/21/19  6:10 PM   Specimen: Urine, Random  Result Value Ref Range Status   Specimen Description   Final    URINE, RANDOM Performed at [facility], [address], [place], [place] [address]    Special Requests   Final    NONE Performed at [facility], [address] [place], [place] [address]    Culture MULTIPLE SPECIES PRESENT, SUGGEST RECOLLECTION (A)  Final   Report Status 02/23/2019 FINAL  Final  SARS CORONAVIRUS 2 (TAT 6-24 HRS) Nasopharyngeal Nasopharyngeal Swab     Status: None   Collection Time: 02/21/19  6:15 PM   Specimen: Nasopharyngeal Swab  Result Value Ref Range Status   SARS Coronavirus 2 NEGATIVE NEGATIVE Final    Comment: (NOTE) SARS-CoV-2 target nucleic acids are NOT DETECTED. The SARS-CoV-2 RNA is generally detectable in upper and lower respiratory specimens during the acute phase of infection. Negative results do not preclude SARS-CoV-2 infection, do not rule out co-infections with other pathogens, and should not be used as the sole basis for treatment or other patient management decisions. Negative results must be combined with clinical observations, patient history, and epidemiological information. The expected result is Negative. Fact Sheet for Patients: HairSlick.no Fact Sheet for Healthcare Providers: quierodirigir.com This test is not yet approved or cleared by the [place] FDA and  has been authorized for detection and/or diagnosis of SARS-CoV-2 by FDA under an Emergency Use Authorization (EUA). This EUA will remain  in effect (meaning this test can be used) for the duration of the COVID-19  declaration under Section 56 4(b)(1) of the Act, 21 U.S.C. section 360bbb-3(b)(1), unless the authorization is terminated or revoked sooner. Performed at [facility], [address], [place], [place] [address]   Blood culture (routine x 2)     Status: None   Collection Time: 02/21/19  8:18 PM   Specimen: BLOOD  Result Value Ref Range Status   Specimen Description   Final    BLOOD RIGHT ANTECUBITAL Performed at [facility], [address], [place], [place] [address]    Special Requests   Final    BOTTLES DRAWN AEROBIC AND ANAEROBIC Blood Culture adequate volume Performed at [facility], [address], [place], [place] [address]    Culture   Final    NO GROWTH 5 DAYS Performed at [facility], [address], [place], [place] [address]    Report Status 02/26/2019  FINAL  Final  Blood culture (routine x 2)     Status: None   Collection Time: 02/21/19 11:07 PM   Specimen: BLOOD  Result Value Ref Range Status   Specimen Description   Final    BLOOD RIGHT ANTECUBITAL Performed at [facility] [address], [place], [place] [address]    Special Requests   Final    BOTTLES DRAWN AEROBIC AND ANAEROBIC Blood Culture results may not be optimal due to an excessive volume of blood received in culture bottles Performed at [facility] [address], [place], [place] [address]    Culture   Final    NO GROWTH 5 DAYS Performed at [facility], [place] [address], [place], [place] [address]    Report Status 02/27/2019 FINAL  Final  MRSA PCR Screening     Status: None   Collection Time: 02/22/19 11:24 AM   Specimen: Nasal Mucosa; Nasopharyngeal  Result Value Ref Range Status   MRSA by PCR NEGATIVE NEGATIVE Final    Comment:        The GeneXpert MRSA Assay (FDA approved for NASAL specimens only), is one component of a comprehensive MRSA colonization surveillance program. It is not intended  to diagnose MRSA infection nor to guide or monitor treatment for MRSA infections. Performed at [facility], [place] [address], [place],  [address]      Radiology Studies: MR BRAIN WO CONTRAST  Result Date: 02/27/2019 CLINICAL DATA:  Encephalopathy EXAM: MRI HEAD WITHOUT CONTRAST TECHNIQUE: Multiplanar, multiecho pulse sequences of the brain and surrounding structures were obtained without intravenous contrast. COMPARISON:  CT head 05/11/2016 FINDINGS: Brain: Negative for acute infarct. Patchy white matter disease bilaterally. Patchy hyperintensity in the pons bilaterally. 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 Negative for hemorrhage or mass. No fluid collection or midline shift. Vascular: Normal arterial flow voids. Skull and upper cervical spine: No focal skeletal lesion. Sinuses/Orbits: Paranasal sinuses clear.  Bilateral cataract surgery Other: Motion degraded study. IMPRESSION: Moderate chronic microvascular ischemic change. No acute abnormality. Electronically Signed   By: Franchot Gallo M.D.   On: 02/27/2019 16:20    Marzetta Board, MD, PhD [facility]  Between 7 am - 7 pm I am available, please contact me via Amion or Securechat  Between 7 pm - 7 am I am not available, please contact night coverage MD/APP via Amion

## 2019-02-28 NOTE — Progress Notes (Signed)
CRITICAL VALUE ALERT  Critical Value:  Potassium 2.7   Date & Time Notied:  0741  Provider Notified: Dr. Cruzita Lederer  Orders Received/Actions taken: Awaiting orders.

## 2019-03-01 ENCOUNTER — Telehealth: Payer: Self-pay | Admitting: Emergency Medicine

## 2019-03-01 LAB — BASIC METABOLIC PANEL
Anion gap: 10 (ref 5–15)
BUN: 22 mg/dL (ref 8–23)
CO2: 29 mmol/L (ref 22–32)
Calcium: 9.1 mg/dL (ref 8.9–10.3)
Chloride: 97 mmol/L — ABNORMAL LOW (ref 98–111)
Creatinine, Ser: 0.89 mg/dL (ref 0.44–1.00)
GFR calc Af Amer: >60 mL/min (ref >60)
GFR calc non Af Amer: >60 mL/min (ref >60)
Glucose, Bld: 121 mg/dL — ABNORMAL HIGH (ref 70–99)
Potassium: 3.5 mmol/L (ref 3.5–5.1)
Sodium: 136 mmol/L (ref 135–145)

## 2019-03-01 LAB — CBC
HCT: 43.3 % (ref 36.0–46.0)
Hemoglobin: 13.1 g/dL (ref 12.0–15.0)
MCH: 26.5 pg (ref 26.0–34.0)
MCHC: 30.3 g/dL (ref 30.0–36.0)
MCV: 87.7 fL (ref 80.0–100.0)
Platelets: 147 10*3/uL — ABNORMAL LOW (ref 150–400)
RBC: 4.94 MIL/uL (ref 3.87–5.11)
RDW: 14.9 % (ref 11.5–15.5)
WBC: 19.2 10*3/uL — ABNORMAL HIGH (ref 4.0–10.5)
nRBC: 0 % (ref 0.0–0.2)

## 2019-03-01 MED ORDER — PREDNISONE 20 MG PO TABS: 20.0000 mg | ORAL_TABLET | Freq: Every day | ORAL | 0 refills | Status: DC

## 2019-03-01 MED ORDER — GUAIFENESIN-DM 100-10 MG/5ML PO SYRP: 5.0000 mL | ORAL_SOLUTION | ORAL | 0 refills | Status: DC | PRN

## 2019-03-01 NOTE — Discharge Instructions (Signed)
Follow with Aura Dials, MD in 5-7 days  Please get a complete blood count and chemistry panel checked by your Primary MD at your next visit, and again as instructed by your Primary MD. Please get your medications reviewed and adjusted by your Primary MD.  Please request your Primary MD to go over all Hospital Tests and Procedure/Radiological results at the follow up, please get all Hospital records sent to your Prim MD by signing hospital release before you go home.  In some cases, there will be blood work, cultures and biopsy results pending at the time of your discharge. Please request that your primary care M.D. goes through all the records of your hospital data and follows up on these results.  If you had Pneumonia of Lung problems at the Hospital: Please get a 2 view Chest X ray done in 6-8 weeks after hospital discharge or sooner if instructed by your Primary MD.  If you have Congestive Heart Failure: Please call your Cardiologist or Primary MD anytime you have any of the following symptoms:  1) 3 pound weight gain in 24 hours or 5 pounds in 1 week  2) shortness of breath, with or without a dry hacking cough  3) swelling in the hands, feet or stomach  4) if you have to sleep on extra pillows at night in order to breathe  Follow cardiac low salt diet and 1.5 lit/day fluid restriction.  If you have diabetes Accuchecks 4 times/day, Once in AM empty stomach and then before each meal. Log in all results and show them to your primary doctor at your next visit. If any glucose reading is under 80 or above 300 call your primary MD immediately.  If you have Seizure/Convulsions/Epilepsy: Please do not drive, operate heavy machinery, participate in activities at heights or participate in high speed sports until you have seen by Primary MD or a Neurologist and advised to do so again. Per [facility] statutes, patients with seizures are not allowed to drive until they have been  seizure-free for six months.  Use caution when using heavy equipment or power tools. Avoid working on ladders or at heights. Take showers instead of baths. Ensure the water temperature is not too high on the home water heater. Do not go swimming alone. Do not lock yourself in a room alone (i.e. bathroom). When caring for infants or small children, sit down when holding, feeding, or changing them to minimize risk of injury to the child in the event you have a seizure. Maintain good sleep hygiene. Avoid alcohol.   If you had Gastrointestinal Bleeding: Please ask your Primary MD to check a complete blood count within one week of discharge or at your next visit. Your endoscopic/colonoscopic biopsies that are pending at the time of discharge, will also need to followed by your Primary MD.  Get Medicines reviewed and adjusted. Please take all your medications with you for your next visit with your Primary MD  Please request your Primary MD to go over all hospital tests and procedure/radiological results at the follow up, please ask your Primary MD to get all Hospital records sent to his/her office.  If you experience worsening of your admission symptoms, develop shortness of breath, life threatening emergency, suicidal or homicidal thoughts you must seek medical attention immediately by calling 911 or calling your MD immediately  if symptoms less severe.  You must read complete instructions/literature along with all the possible adverse reactions/side effects for all the Medicines you take  and that have been prescribed to you. Take any new Medicines after you have completely understood and accpet all the possible adverse reactions/side effects.   Do not drive or operate heavy machinery when taking Pain medications.   Do not take more than prescribed Pain, Sleep and Anxiety Medications  Special Instructions: If you have smoked or chewed Tobacco  in the last 2 yrs please stop smoking, stop any regular  Alcohol  and or any Recreational drug use.  Wear Seat belts while driving.  Please note You were cared for by a hospitalist during your hospital stay. If you have any questions about your discharge medications or the care you received while you were in the hospital after you are discharged, you can call the unit and asked to speak with the hospitalist on call if the hospitalist that took care of you is not available. Once you are discharged, your primary care physician will handle any further medical issues. Please note that NO REFILLS for any discharge medications will be authorized once you are discharged, as it is imperative that you return to your primary care physician (or establish a relationship with a primary care physician if you do not have one) for your aftercare needs so that they can reassess your need for medications and monitor your lab values.  You can reach the hospitalist office at phone [phone] or fax [phone]   If you do not have a primary care physician, you can call [phone] for a physician referral.  Activity: As tolerated with Full fall precautions use [place]ar[place]/cane & assistance as needed    Diet: regular  Disposition Home

## 2019-03-01 NOTE — Discharge Summary (Signed)
Physician Discharge Summary  Gabrielle [name] JQG:[id number] DOB: [birth year] DOA: 02/21/2019  PCP: Henrine Screws, MD  Admit date: 02/21/2019 Discharge date: 03/01/2019  Admitted From: home Disposition:  home  Recommendations for Outpatient Follow-up:  1. Follow up with PCP in 1-2 weeks 2. Complete steroid taper for 5 more days   Home Health: PT Equipment/Devices: none  Discharge Condition: stable CODE STATUS: Full code Diet recommendation: heart healthy  HPI: Per admitting MD, Gabrielle [name] is a 73 y.o. 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  Change in behavior been going on for past 2 days.  She has decreased her p.o. intake.  Has been calling repeatedly her family members sounded very confused.  She has been reporting that her cats was given per and that the bugs are crawling all over the walls.  Patient took 2 melatonin tablets yesterday.  Her family was concerned that that was too much and was causing her symptoms.  Family states in the past she have had hallucinations of UTI.  She was febrile at home up to 101.1 she has known history of COPD. Patient does endorse increased urine frequency which caused her to stop her metoprolol because she was think it was making her urinate too often Looking at records patient has been hospitalized in 2018 with delirium thought to be secondary to UTI  Hospital Course / Discharge diagnoses:  Principal Problem Acute hypoxic respiratory failure -Likely multifactorial due to acute on chronic CHF/COPD exacerbation versus pneumonia. Patient with a CT angiogram which was negative for PE. She was treated with antibiotics, steroids with improvement in her respiratory status and now stable on room air.  2D  echo as below with recovered EF 50-55%, grade 2 DD  Active Problems Acute metabolic encephalopathy -Patient was hallucinating and was quite confused at home, she has good insight into her hallucinations.She underwent an MRI of the brain without acute findings. Suspect medication induced, and patient was advised to stop her home Xanax and muscle relaxers. Her mental status improved, she is back to baseline and will be discharged home in stable condition.   Leukocytosis - no fever, likely due to steroids  Hypokalemia -repleted  Suspected pneumonia -CT of the chest showed new small areas of patchy airspace opacities at the left lung base and posterior right upper lung, she was started on antibiotics, SARS-CoV-2 was negative. Completed a 7-day course with last dose 12/27  COPD exacerbation -Significant wheezing was present on admission, she was on IV steroids and this was changed to prednisone. Continue taper after discharge  Acute kidney injury -Resolved, creatinine has normalized  Paroxysmal A. fib/history of tachybradycardia syndrome -Pacemaker in place, currently rate controlled.  Continue Cardizem, metoprolol.  Not anticoagulation due to prior GI bleed.  Hypertension -Continue current regimen, blood pressure stable  Hyperlipidemia -On statin  History of CAD -No chest pain, status post CABG in 2010, stable  Discharge Instructions   Allergies as of 03/01/2019      Reactions   Novocain [procaine] Hives, Palpitations   Penicillins Hives, Other (See Comments)   Has patient had a PCN reaction causing immediate rash, facial/tongue/throat swelling, SOB or lightheadedness with hypotension: No Has patient had a PCN reaction causing severe rash involving mucus membranes or skin necrosis: No Has patient had a PCN reaction that required hospitalization  No Has patient had a PCN reaction occurring within the last 10 years: No If all of the above answers are "NO", then may proceed with  Cephalosporin use.      Medication List    STOP taking these medications   ALPRAZolam 1 MG tablet Commonly known as: XANAX   tiZANidine 4 MG tablet Commonly known as: ZANAFLEX     TAKE these medications   albuterol 108 (90 Base) MCG/ACT inhaler Commonly known as: VENTOLIN HFA Inhale 2 puffs into the lungs every 6 (six) hours as needed for wheezing or shortness of breath.   atorvastatin 20 MG tablet Commonly known as: LIPITOR Take 1 tablet (20 mg total) by mouth daily at 6 PM.   budesonide-formoterol 160-4.5 MCG/ACT inhaler Commonly known as: Symbicort Inhale 2 puffs into the lungs 2 (two) times daily.   citalopram 20 MG tablet Commonly known as: CELEXA Take 20 mg by mouth daily.   clobetasol cream 0.05 % Commonly known as: TEMOVATE Apply 1 application topically 2 (two) times daily. Affected skin   Cosentyx Sensoready (300 MG) 150 MG/ML Soaj Generic drug: Secukinumab (300 MG Dose) Inject 2 Syringes into the skin every 28 (twenty-eight) days.   diclofenac sodium 1 % Gel Commonly known as: VOLTAREN Apply 4 g topically 4 (four) times daily. What changed:   when to take this  reasons to take this   diltiazem 120 MG 24 hr capsule Commonly known as: TIAZAC Take 1 capsule (120 mg total) by mouth daily.   docusate sodium 100 MG capsule Commonly known as: COLACE Take 100 mg by mouth 2 (two) times daily as needed for moderate constipation.   fluticasone 50 MCG/ACT nasal spray Commonly known as: FLONASE Place 2 sprays into both nostrils daily. What changed:   when to take this  reasons to take this   furosemide 20 MG tablet Commonly known as: LASIX Take 1 tablet (20 mg total) by mouth daily.   guaiFENesin-dextromethorphan 100-10 MG/5ML syrup Commonly known as: ROBITUSSIN DM Take 5 mLs by mouth every 4 (four) hours as needed for cough (chest congestion).   Melatonin 10 MG Tabs Take 10 mg by mouth at bedtime.   metoprolol tartrate 50 MG tablet Commonly  known as: LOPRESSOR TAKE 1 TABLET AT BEDTIME; TAKE 1 ADDITIONAL TABLET AS NEEDED FOR PALPITATIONS What changed:   how much to take  how to take this  when to take this  additional instructions   pantoprazole 40 MG tablet Commonly known as: PROTONIX Take 1 tablet (40 mg total) by mouth daily.   potassium chloride SA 20 MEQ tablet Commonly known as: KLOR-CON Take 1 tablet (20 mEq total) by mouth daily.   predniSONE 20 MG tablet Commonly known as: DELTASONE Take 1 tablet (20 mg total) by mouth daily with breakfast.   TYLENOL 8 HOUR ARTHRITIS PAIN PO Take 2 tablets by mouth 2 (two) times daily. Back pain      Follow-up Information    Henrine Screws, MD. Schedule an appointment as soon as possible for a visit in 1 week(s).   Specialty: Family Medicine Contact information: [address] [place] [address] [phone]        Jake Bathe, MD .   Specialty: Cardiology Contact information: [phone] [address] [place] [address] [phone]           Consultations:  None   Procedures/Studies:  2D echo  IMPRESSIONS    1. Left ventricular ejection fraction, by visual estimation,  is 50 to 55%. The left ventricle has normal function. There is no left ventricular hypertrophy.  2. Left ventricular diastolic parameters are consistent with Grade II diastolic dysfunction (pseudonormalization).  3. Mildly dilated left ventricular internal cavity size.  4. The left ventricle demonstrates regional wall motion abnormalities.  5. Global right ventricle was not well visualized.The right ventricular size is not well visualized. Right vetricular wall thickness was not assessed.  6. Left atrial size was severely dilated.  7. Right atrial size was not well visualized.  8. Moderate mitral annular calcification.  9. The mitral valve is normal in structure. Mild mitral valve regurgitation. No evidence of mitral stenosis. 10. The tricuspid  valve is not well visualized. 11. The aortic valve is tricuspid. Aortic valve regurgitation is not visualized. Mild aortic valve sclerosis without stenosis. 12. The pulmonic valve was not well visualized. Pulmonic valve regurgitation is trivial. 13. Mildly elevated pulmonary artery systolic pressure. 14. The inferior vena cava is normal in size with greater than 50% respiratory variability, suggesting right atrial pressure of 3 mmHg. 15. Technically difficult; definity used; mild apical hypokinesis; overall preserved LV function; grade 2 diastolic dysfunction; mild LVE; severe LAE; mild MR; right heart not well visualized.   DG Chest 1 View  Result Date: 02/22/2019 CLINICAL DATA:  Shortness of breath, fever EXAM: CHEST  1 VIEW COMPARISON:  02/21/2019 FINDINGS: Increased interstitial prominence and patchy density primarily at the lung bases. No significant pleural effusion. No pneumothorax. Stable cardiomediastinal contours with cardiomegaly and evidence of prior cardiac surgery. Left chest wall dual lead pacemaker is again noted. IMPRESSION: Interstitial prominence and increased density at the lung bases, which could reflect edema or pneumonia in the appropriate setting. Electronically Signed   By: Guadlupe Spanish M.D.   On: 02/22/2019 10:58   DG Chest 2 View  Result Date: 02/21/2019 CLINICAL DATA:  Hallucinations, altered mental status, fever. COPD. EXAM: CHEST - 2 VIEW COMPARISON:  07/02/2018 FINDINGS: Dual lead pacer in place. Prior CABG. Atherosclerotic calcification of the aortic arch. Stable scarring in the left mid lung and lung bases. Mild enlargement of the cardiopericardial silhouette, without edema. The lungs appear otherwise clear. IMPRESSION: 1. Stable scarring in the left mid lung and lung bases. 2. Mild enlargement of the cardiopericardial silhouette, without edema. Electronically Signed   By: Gaylyn Rong M.D.   On: 02/21/2019 18:06   CT ANGIO CHEST PE W OR WO  CONTRAST  Result Date: 02/22/2019 CLINICAL DATA:  Fever and elevated D-dimer EXAM: CT ANGIOGRAPHY CHEST WITH CONTRAST TECHNIQUE: Multidetector CT imaging of the chest was performed using the standard protocol during bolus administration of intravenous contrast. Multiplanar CT image reconstructions and MIPs were obtained to evaluate the vascular anatomy. CONTRAST:  OMNIPAQUE IOHEXOL 350 MG/ML SOLN COMPARISON:  CT chest December 22, 2015 FINDINGS: Cardiovascular: There is a optimal opacification of the pulmonary arteries. There is no central,segmental, or subsegmental filling defects within the pulmonary arteries. There is moderate cardiomegaly. A left-sided AICD seen with the lead tips in the right atrium and right ventricle. Mitral valve and coronary artery calcifications are seen. Overlying median sternotomy wires. There is normal three-vessel brachiocephalic anatomy without proximal stenosis. The thoracic aorta is normal in appearance. Scattered aortic atherosclerotic calcifications are seen without aneurysmal dilatation. Mediastinum/Nodes: No hilar, mediastinal, or axillary adenopathy. Thyroid gland, trachea, and esophagus demonstrate no significant findings. Lungs/Pleura: There are stable areas of streaky scarring seen throughout both lungs. There is a new patchy airspace opacity seen within the posterior right upper  lobe. There is also a rounded patchy airspace opacity at the periphery of the left lung base. No large area of consolidation or pleural effusion. Upper Abdomen: No acute abnormalities present in the visualized portions of the upper abdomen. Musculoskeletal: No chest wall abnormality. No acute or significant osseous findings. There is unchanged compression deformity of the T9 and T12 vertebral bodies are seen with 50-75% loss of vertebral body height. There is slight superior compression deformity of the T11 vertebral body with less than 25% loss in height, with slight interval progression  since the prior exam. No retropulsion of fragments is seen. There is buckling of the T12 posterior cortex as on prior exam. Review of the MIP images confirms the above findings. IMPRESSION: No central, segmental, or subsegmental pulmonary embolism. New small areas of patchy airspace opacity at the left lung base and posterior right upper lung. These are nonspecific could be due to atelectasis and/or early multifocal infection. Stable areas of streaky scarring. Moderate cardiomegaly Chronic compression deformities of the thoracic spine as described above. Electronically Signed   By: Jonna ClarkBindu  Avutu M.D.   On: 02/22/2019 03:45   MR BRAIN WO CONTRAST  Result Date: 02/27/2019 CLINICAL DATA:  Encephalopathy EXAM: MRI HEAD WITHOUT CONTRAST TECHNIQUE: Multiplanar, multiecho pulse sequences of the brain and surrounding structures were obtained without intravenous contrast. COMPARISON:  CT head 05/11/2016 FINDINGS: Brain: Negative for acute infarct. Patchy white matter disease bilaterally. Patchy hyperintensity in the pons bilaterally. 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 Negative for hemorrhage or mass. No fluid collection or midline shift. Vascular: Normal arterial flow voids. Skull and upper cervical spine: No focal skeletal lesion. Sinuses/Orbits: Paranasal sinuses clear.  Bilateral cataract surgery Other: Motion degraded study. IMPRESSION: Moderate chronic microvascular ischemic change. No acute abnormality. Electronically Signed   By: Marlan Palauharles  Clark M.D.   On: 02/27/2019 16:20   ECHOCARDIOGRAM COMPLETE  Result Date: 02/22/2019   ECHOCARDIOGRAM REPORT   Patient Name:   Gabrielle [name] Date of Exam: 02/22/2019 Medical Rec #:  [id number]          Height:       62.0 in Accession #:    [id number]         Weight:       197.0 lb Date of Birth:  May 29, 1945          BSA:          1.90 m Patient Age:    73 years           BP:           181/78 mmHg Patient Gender: F                  HR:           74 bpm.  Exam Location:  Inpatient Procedure: 2D Echo, Color Doppler, Cardiac Doppler and Intracardiac            Opacification Agent Indications:    I51.7 Cardiomegaly  History:        Patient has prior history of Echocardiogram examinations, most                 recent 05/17/2016. CHF, Pacemaker, COPD, Arrythmias:Atrial                 Fibrillation; Risk Factors:Dyslipidemia and Hypertension.  Sonographer:    Irving BurtonEmily Senior RDCS Referring Phys: [id number] ANASTASSIA DOUTOVA  Sonographer Comments: Technically difficult study due to poor echo windows. Patient supine on CPAP. IMPRESSIONS  1. Left ventricular ejection fraction, by visual estimation, is 50 to 55%. The left ventricle has normal function. There is no left ventricular hypertrophy.  2. Left ventricular diastolic parameters are consistent with Grade II diastolic dysfunction (pseudonormalization).  3. Mildly dilated left ventricular internal cavity size.  4. The left ventricle demonstrates regional wall motion abnormalities.  5. Global right ventricle was not well visualized.The right ventricular size is not well visualized. Right vetricular wall thickness was not assessed.  6. Left atrial size was severely dilated.  7. Right atrial size was not well visualized.  8. Moderate mitral annular calcification.  9. The mitral valve is normal in structure. Mild mitral valve regurgitation. No evidence of mitral stenosis. 10. The tricuspid valve is not well visualized. 11. The aortic valve is tricuspid. Aortic valve regurgitation is not visualized. Mild aortic valve sclerosis without stenosis. 12. The pulmonic valve was not well visualized. Pulmonic valve regurgitation is trivial. 13. Mildly elevated pulmonary artery systolic pressure. 14. The inferior vena cava is normal in size with greater than 50% respiratory variability, suggesting right atrial pressure of 3 mmHg. 15. Technically difficult; definity used; mild apical hypokinesis; overall preserved LV function; grade 2 diastolic  dysfunction; mild LVE; severe LAE; mild MR; right heart not well visualized. FINDINGS  Left Ventricle: Left ventricular ejection fraction, by visual estimation, is 50 to 55%. The left ventricle has normal function. The left ventricle demonstrates regional wall motion abnormalities. The left ventricular internal cavity size was mildly dilated left ventricle. There is no left ventricular hypertrophy. Left ventricular diastolic parameters are consistent with Grade II diastolic dysfunction (pseudonormalization). Normal left atrial pressure. Right Ventricle: The right ventricular size is not well visualized.Global RV systolic function is was not well visualized. The tricuspid regurgitant velocity is 2.70 m/s, and with an assumed right atrial pressure of 3 mmHg, the estimated right ventricular systolic pressure is mildly elevated at 32.2 mmHg. Left Atrium: Left atrial size was severely dilated. Right Atrium: Right atrial size was not well visualized Pericardium: There is no evidence of pericardial effusion. Mitral Valve: The mitral valve is normal in structure. Moderate mitral annular calcification. Mild mitral valve regurgitation. No evidence of mitral valve stenosis by observation. MV peak gradient, 10.9 mmHg. Tricuspid Valve: The tricuspid valve is not well visualized. Tricuspid valve regurgitation is trivial. Aortic Valve: The aortic valve is tricuspid. Aortic valve regurgitation is not visualized. Mild aortic valve sclerosis is present, with no evidence of aortic valve stenosis. Pulmonic Valve: The pulmonic valve was not well visualized. Pulmonic valve regurgitation is trivial. Pulmonic regurgitation is trivial. Aorta: The aortic root is normal in size and structure. Venous: The inferior vena cava is normal in size with greater than 50% respiratory variability, suggesting right atrial pressure of 3 mmHg.  Additional Comments: Technically difficult; definity used; mild apical hypokinesis; overall preserved LV  function; grade 2 diastolic dysfunction; mild LVE; severe LAE; mild MR; right heart not well visualized.  LEFT VENTRICLE PLAX 2D LVIDd:         5.50 cm LVIDs:         2.90 cm LV PW:         1.00 cm LV IVS:        1.00 cm LVOT diam:     2.00 cm LV SV:         115 ml LV SV Index:   57.04 LVOT Area:     3.14 cm  RIGHT VENTRICLE RV S prime:     8.49 cm/s TAPSE (M-mode): 2.3  cm LEFT ATRIUM             Index LA diam:        4.60 cm 2.42 cm/m LA Vol (A2C):   95.4 ml 50.22 ml/m LA Vol (A4C):   80.1 ml 42.17 ml/m LA Biplane Vol: 88.0 ml 46.33 ml/m  AORTIC VALVE LVOT Vmax:   97.90 cm/s LVOT Vmean:  55.700 cm/s LVOT VTI:    0.164 m  AORTA Ao Root diam: 2.80 cm Ao Asc diam:  3.30 cm MITRAL VALVE            TRICUSPID VALVE MV Peak grad: 10.9 mmHg TR Peak grad:   29.2 mmHg MV Mean grad: 4.0 mmHg  TR Vmax:        270.00 cm/s MV Vmax:      1.65 m/s MV Vmean:     98.6 cm/s SHUNTS MV VTI:       0.35 m    Systemic VTI:  0.16 m                         Systemic Diam: 2.00 cm  Olga Millers MD Electronically signed by Olga Millers MD Signature Date/Time: 02/22/2019/12:22:46 PM    Final    CUP PACEART REMOTE DEVICE CHECK  Result Date: 02/07/2019 Scheduled remote reviewed.  Normal device function.  AF burden 2%, longest episode 23hrs. Rates controlled. Known PAF, no OAC due to history of GI bleeding. Next remote 91 days- JBox, RN/CVRS    Subjective: - no chest pain, shortness of breath, no abdominal pain, nausea or vomiting.   Discharge Exam: BP (!) 144/92 (BP Location: Right Arm)   Pulse 70   Temp 97.7 F (36.5 C) (Oral)   Resp 16   Ht  (1.575 m)   Wt 89.4 kg   LMP  (LMP Unknown)   SpO2 94%   BMI 36.03 kg/m   General: Pt is alert, awake, not in acute distress Cardiovascular: irregular, trace edema Respiratory: CTA bilaterally, no wheezing, no rhonchi Abdominal: Soft, NT, ND, bowel sounds + Extremities: no edema, no cyanosis    The results of significant diagnostics from this hospitalization  (including imaging, microbiology, ancillary and laboratory) are listed below for reference.     Microbiology: Recent Results (from the past 240 hour(s))  Urine culture     Status: Abnormal   Collection Time: 02/21/19  6:10 PM   Specimen: Urine, Random  Result Value Ref Range Status   Specimen Description   Final    URINE, RANDOM Performed at [facility], [address] [place], [place] [address]    Special Requests   Final    NONE Performed at [facility], [address], [place], [place] [address]    Culture MULTIPLE SPECIES PRESENT, SUGGEST RECOLLECTION (A)  Final   Report Status 02/23/2019 FINAL  Final  SARS CORONAVIRUS 2 (TAT 6-24 HRS) Nasopharyngeal Nasopharyngeal Swab     Status: None   Collection Time: 02/21/19  6:15 PM   Specimen: Nasopharyngeal Swab  Result Value Ref Range Status   SARS Coronavirus 2 NEGATIVE NEGATIVE Final    Comment: (NOTE) SARS-CoV-2 target nucleic acids are NOT DETECTED. The SARS-CoV-2 RNA is generally detectable in upper and lower respiratory specimens during the acute phase of infection. Negative results do not preclude SARS-CoV-2 infection, do not rule out co-infections with other pathogens, and should not be used as the sole basis for treatment or other patient management decisions.  Negative results must be combined with clinical observations, patient history, and epidemiological information. The expected result is Negative. Fact Sheet for Patients: HairSlick.no Fact Sheet for Healthcare Providers: quierodirigir.com This test is not yet approved or cleared by the [place] FDA and  has been authorized for detection and/or diagnosis of SARS-CoV-2 by FDA under an Emergency Use Authorization (EUA). This EUA will remain  in effect (meaning this test can be used) for the duration of the COVID-19 declaration under Section 56 4(b)(1) of the Act, 21  U.S.C. section 360bbb-3(b)(1), unless the authorization is terminated or revoked sooner. Performed at [facility], [address] [place], [place] [address]   Blood culture (routine x 2)     Status: None   Collection Time: 02/21/19  8:18 PM   Specimen: BLOOD  Result Value Ref Range Status   Specimen Description   Final    BLOOD RIGHT ANTECUBITAL Performed at [facility], [address], [place], [place] [address]    Special Requests   Final    BOTTLES DRAWN AEROBIC AND ANAEROBIC Blood Culture adequate volume Performed at [facility], [address], [place], [place] [address]    Culture   Final    NO GROWTH 5 DAYS Performed at [facility], [address], [place], [place] [address]    Report Status 02/26/2019 FINAL  Final  Blood culture (routine x 2)     Status: None   Collection Time: 02/21/19 11:07 PM   Specimen: BLOOD  Result Value Ref Range Status   Specimen Description   Final    BLOOD RIGHT ANTECUBITAL Performed at [facility], [address] [place], [place] [address]    Special Requests   Final    BOTTLES DRAWN AEROBIC AND ANAEROBIC Blood Culture results may not be optimal due to an excessive volume of blood received in culture bottles Performed at [facility], [address] [place], [place] [address]    Culture   Final    NO GROWTH 5 DAYS Performed at [facility], [address], [place], [place] [address]    Report Status 02/27/2019 FINAL  Final  MRSA PCR Screening     Status: None   Collection Time: 02/22/19 11:24 AM   Specimen: Nasal Mucosa; Nasopharyngeal  Result Value Ref Range Status   MRSA by PCR NEGATIVE NEGATIVE Final    Comment:        The GeneXpert MRSA Assay (FDA approved for NASAL specimens only), is one component of a comprehensive MRSA colonization surveillance program. It is not intended to diagnose MRSA infection nor to guide  or monitor treatment for MRSA infections. Performed at [facility], [address] [place], [place] [address]      Labs: Basic Metabolic Panel: Recent Labs  Lab 02/24/19 0202 02/25/19 0206 02/26/19 0602 02/28/19 0550 03/01/19 0625  NA 141 142 139 140 136  K 3.1* 4.3 3.7 2.7* 3.5  CL 95* 99 93* 96* 97*  CO2 GLUCOSE 174* 174* 143* 189* 121*  BUN 31* 37* 32* 25* 22  CREATININE 1.08* 1.14* 0.94 1.05* 0.89  CALCIUM 9.7 9.8 9.3 9.3 9.1  MG 2.1  --   --   --   --    Liver Function Tests: Recent Labs  Lab 02/28/19 0550  AST 26  ALT 20  ALKPHOS 53  BILITOT 0.9  PROT 5.7*  ALBUMIN 3.3*  CBC: Recent Labs  Lab 02/24/19 0202 02/25/19 0206 02/26/19 0602 02/28/19 0550 03/01/19 0625  WBC 14.8* 18.6* 15.3* 13.0* 19.2*  NEUTROABS 13.5* 16.8* 12.8*  --   --   HGB 12.4 12.6 12.4 12.4 13.1  HCT 41.9 43.3 42.4 41.8 43.3  MCV 87.5 90.6 88.9 87.8 87.7  PLT [phone] 147*   CBG: Recent Labs  Lab 02/27/19 1146  GLUCAP 284*   Hgb A1c No results for input(s): HGBA1C in the last 72 hours. Lipid Profile No results for input(s): CHOL, HDL, LDLCALC, TRIG, CHOLHDL, LDLDIRECT in the last 72 hours. Thyroid function studies No results for input(s): TSH, T4TOTAL, T3FREE, THYROIDAB in the last 72 hours.  Invalid input(s): FREET3 Urinalysis    Component Value Date/Time   COLORURINE YELLOW 02/21/2019 1810   APPEARANCEUR CLEAR 02/21/2019 1810   LABSPEC 1.018 02/21/2019 1810   PHURINE 6.0 02/21/2019 1810   GLUCOSEU NEGATIVE 02/21/2019 1810   HGBUR NEGATIVE 02/21/2019 1810   BILIRUBINUR NEGATIVE 02/21/2019 1810   KETONESUR NEGATIVE 02/21/2019 1810   PROTEINUR 30 (A) 02/21/2019 1810   UROBILINOGEN 1.0 12/02/2010 1023   NITRITE NEGATIVE 02/21/2019 1810   LEUKOCYTESUR NEGATIVE 02/21/2019 1810    FURTHER DISCHARGE INSTRUCTIONS:   Get Medicines reviewed and adjusted: Please take all your medications with you for your next visit with  your Primary MD   Laboratory/radiological data: Please request your Primary MD to go over all hospital tests and procedure/radiological results at the follow up, please ask your Primary MD to get all Hospital records sent to his/her office.   In some cases, they will be blood work, cultures and biopsy results pending at the time of your discharge. Please request that your primary care M.D. goes through all the records of your hospital data and follows up on these results.   Also Note the following: If you experience worsening of your admission symptoms, develop shortness of breath, life threatening emergency, suicidal or homicidal thoughts you must seek medical attention immediately by calling 911 or calling your MD immediately  if symptoms less severe.   You must read complete instructions/literature along with all the possible adverse reactions/side effects for all the Medicines you take and that have been prescribed to you. Take any new Medicines after you have completely understood and accpet all the possible adverse reactions/side effects.    Do not drive when taking Pain medications or sleeping medications (Benzodaizepines)   Do not take more than prescribed Pain, Sleep and Anxiety Medications. It is not advisable to combine anxiety,sleep and pain medications without talking with your primary care practitioner   Special Instructions: If you have smoked or chewed Tobacco  in the last 2 yrs please stop smoking, stop any regular Alcohol  and or any Recreational drug use.   Wear Seat belts while driving.   Please note: You were cared for by a hospitalist during your hospital stay. Once you are discharged, your primary care physician will handle any further medical issues. Please note that NO REFILLS for any discharge medications will be authorized once you are discharged, as it is imperative that you return to your primary care physician (or establish a relationship with a primary care  physician if you do not have one) for your post hospital discharge needs so that they can reassess your need for medications and monitor your lab values.  Time coordinating discharge: 40 minutes  SIGNED:  Pamella Pert, MD, PhD 03/01/2019, 8:41 AM

## 2019-03-01 NOTE — Telephone Encounter (Signed)
Patient discharged from hospital earlier today. No reports of palpitations, CP, SOB, increased pedal edema. Will call Monday 03/06/19 and assist patient with manual transmission from device.

## 2019-03-01 NOTE — TOC Initial Note (Addendum)
Transition of Care [facility]) - Initial/Assessment Note    Patient Details  Name: Gabrielle [name] MRN: [id number] Date of Birth: 1946/01/06  [facility] CM/SW Contact:    Lia Hopping, [place] Phone Number: 03/01/2019, 10:04 AM  Clinical Narrative:                 CSW met with the patient and her sister at bedside to discuss [place]. Patient declines home health.  Patient live alone with available support of her sister. Patient states she is independent with ADL's . She uses a Corporate investment banker for assistance. Patient sister reports the patient will discharge home with her for a couple of days before returning home.  No other needs identified.   Expected Discharge Plan: Home/Self Care Barriers to Discharge: No Barriers Identified   Patient Goals and CMS Choice Patient states their goals for this hospitalization and ongoing recovery are:: "get back home to my cat" CMS Medicare.gov Compare Post Acute Care list provided to:: Patient Choice offered to / list presented to : NA  Expected Discharge Plan and Services Expected Discharge Plan: Home/Self Care In-house Referral: Clinical Social Work Discharge Planning Services: CM Consult   Living arrangements for the past 2 months: Single Family Home Expected Discharge Date: 03/01/19                                    Prior Living Arrangements/Services Living arrangements for the past 2 months: Single Family Home Lives with:: Self Patient language and need for interpreter reviewed:: No Do you feel safe going back to the place where you live?: Yes      Need for Family Participation in Patient Care: Yes (Comment) Care giver support system in place?: Yes (comment) Current home services: DME Criminal Activity/Legal Involvement Pertinent to Current Situation/Hospitalization: No - Comment as needed  Activities of Daily Living Home Assistive Devices/Equipment: Cane (specify quad or straight), Walker (specify type), Shower  chair with back(single poit cane, front wheeled walker) ADL Screening (condition at time of admission) Patient's cognitive ability adequate to safely complete daily activities?: No(patient has altered mental status) Is the patient deaf or have difficulty hearing?: No Does the patient have difficulty seeing, even when wearing glasses/contacts?: No Does the patient have difficulty concentrating, remembering, or making decisions?: Yes Patient able to express need for assistance with ADLs?: No Does the patient have difficulty dressing or bathing?: Yes Independently performs ADLs?: No Communication: Needs assistance(patient with altered mental status) Is this a change from baseline?: Change from baseline, expected to last >3 days Dressing (OT): Dependent Is this a change from baseline?: Change from baseline, expected to last >3 days Grooming: Dependent Is this a change from baseline?: Change from baseline, expected to last >3 days Feeding: Dependent Is this a change from baseline?: Change from baseline, expected to last >3 days Bathing: Dependent Is this a change from baseline?: Change from baseline, expected to last >3 days Toileting: Dependent Is this a change from baseline?: Change from baseline, expected to last >3days In/Out Bed: Dependent Is this a change from baseline?: Change from baseline, expected to last >3 days Walks in Home: Dependent Is this a change from baseline?: Change from baseline, expected to last >3 days Does the patient have difficulty walking or climbing stairs?: Yes(secondary to weakness) Weakness of Legs: Both Weakness of Arms/Hands: None  Permission Sought/Granted Permission sought to share information with : Family Supports, Case Manager  Permission granted to share information with : Yes, Verbal Permission Granted        Permission granted to share info w Relationship: Alesia Richards   [phone]     Emotional Assessment Appearance:: Appears stated  age Attitude/Demeanor/Rapport: Engaged Affect (typically observed): Calm, Accepting Orientation: : Oriented to Place, Oriented to  Time, Oriented to Situation, Oriented to Self Alcohol / Substance Use: Not Applicable Psych Involvement: No (comment)  Admission diagnosis:  Acute respiratory failure (HCC) [J96.00] SOB (shortness of breath) [R06.02] Fever [R50.9] SIRS (systemic inflammatory response syndrome) (HCC) [R65.10] Patient Active Problem List   Diagnosis Date Noted  . SIRS (systemic inflammatory response syndrome) ([place]) 02/21/2019  . Acute metabolic encephalopathy 57/49/3552  . Acute respiratory failure ([place]) 02/21/2019  . AKI (acute kidney injury) ([place]) 02/21/2019  . Urinary tract infection without hematuria   . Atrial fibrillation with RVR ([place]) 07/02/2018  . Nausea and vomiting 07/02/2018  . Nausea and/or vomiting 07/01/2018  . Acute lower UTI 07/01/2018  . Compression fracture of T12 vertebra (HCC)   . Hypoxia   . Acute on chronic diastolic CHF (congestive heart failure) /EF 55 % 08/19/2016  . CHF exacerbation ([place]) 08/19/2016  . Chronic diastolic CHF (congestive heart failure) ([place])   . Hypoxemia   . Physical deconditioning   . Dementia with behavioral disturbance ([place])   . H/O: GI bleed   . Protein-calorie malnutrition, severe 05/15/2016  . Altered mental status   . Acute renal failure (ARF) ([place])   . Encounter for central line care   . Hypokalemia 05/11/2016  . Thrombocytopenia ([place]) 12/25/2015  . Acute congestive heart failure ([place])   . SOB (shortness of breath)   . Dysphagia 12/24/2015  . Tachy-brady syndrome ([place]) 12/24/2015  . Chronic anticoagulation   . Fall   . Prolonged Q-T interval on ECG   . Hypoxic Respiratory failure, acute ([place]) 12/16/2015  . Anemia due to blood loss, acute 10/27/2015  . Elevated lactic acid level 10/27/2015  . Arterial hypotension   . PAF (paroxysmal atrial fibrillation) ([place]) 10/21/2015  . Severe anemia   . Symptomatic  anemia   . Coronary artery disease due to lipid rich plaque 02/07/2014  . Obesity 02/07/2014  . Abdominal aortic ectasia ([place]) 09/06/2013  . Encounter for therapeutic drug monitoring 07/13/2013  . Atrial fibrillation ([place]) 12/20/2012  . Atrial fibrillation with controlled ventricular response ([place]) 11/30/2009  . NICOTINE ADDICTION 04/02/2009  . PARAPSORIASIS 12/15/2008  . Hyperlipidemia 12/14/2008  . HEART ATTACK 12/14/2008  . HEADACHE, CHRONIC 12/14/2008  . Depression 12/13/2008  . Hypertensive heart disease with CHF (congestive heart failure) ([place]) 12/13/2008  . COPD exacerbation ([place]) 12/13/2008  . ARTHRITIS 12/13/2008   PCP:  Aura Dials, MD Pharmacy:   [id number] [place], [id number]: [phone]: [phone] [id number]: [phone]: [phone]    Social Determinants of Health (SDOH) Interventions    Readmission Risk Interventions No flowsheet data found.

## 2019-03-06 NOTE — Telephone Encounter (Signed)
Patient reports she feels better today. No increased pedal edema and sob improved. Remote transmission sent that shows patient is still in AF with occasional episodes of RVR. No OAC due to hx of bleeding. Will forward to Dr Ladona Ridgel for further recommendations.

## 2019-03-06 NOTE — Telephone Encounter (Signed)
I asked the pt to send a transmission with her home monitor and she agreed. Transmission received. 03/06/2019

## 2019-03-09 NOTE — Telephone Encounter (Signed)
Agree with plans for followup

## 2019-03-10 ENCOUNTER — Ambulatory Visit: Payer: Medicare Other | Admitting: Cardiology

## 2019-03-10 NOTE — Progress Notes (Signed)
PPM Remote  

## 2019-03-31 ENCOUNTER — Encounter: Payer: Medicare Other | Admitting: Physician Assistant

## 2019-03-31 NOTE — Telephone Encounter (Signed)
Persistent AF noted since end of 01/2019 per PPM transmission from 03/11/19. V rates during AT/AF not well controlled (data since 09/2016 as PPM has not been interrogated in-clinic since then).  Overdue for EP f/u since 09/2017, scheduled with Francis Dowse, PA, on 03/31/19.  Patient canceled appointment.  Spoke with patient.  Patient reports that her PPM is "fine" and Dr. Anne Fu is the only physician she wants managing her cardiac care.  Explained that the recommendation is to also follow with an EP.  Pt states, "well why would they trust a PA then."  Explained EP APPs have specialized training for device management.  Pt reports she is willing to see Dr. Ladona Ridgel only, and only in the month of February as she has to rely on her sisters to bring her to appointments.  Offered f/u in a few months to minimize transportation burden as she is scheduled to see Dr. Anne Fu on 04/06/19, but patient states she will only be able to come in February.  Explained that I will try to work with the scheduler to arrange f/u.  Patient is in agreement with plan.  Routed to Dr. Ladona Ridgel and Dr. Anne Fu as [place]ain Childes.

## 2019-04-06 ENCOUNTER — Ambulatory Visit (INDEPENDENT_AMBULATORY_CARE_PROVIDER_SITE_OTHER): Payer: Medicare Other | Admitting: Cardiology

## 2019-04-06 ENCOUNTER — Encounter: Payer: Self-pay | Admitting: Internal Medicine

## 2019-04-06 ENCOUNTER — Ambulatory Visit (INDEPENDENT_AMBULATORY_CARE_PROVIDER_SITE_OTHER): Payer: Medicare Other | Admitting: Internal Medicine

## 2019-04-06 ENCOUNTER — Encounter: Payer: Self-pay | Admitting: Cardiology

## 2019-04-06 ENCOUNTER — Other Ambulatory Visit: Payer: Self-pay

## 2019-04-06 VITALS — BP 128/80 | HR 164 | Ht 62.0 in | Wt 201.4 lb

## 2019-04-06 DIAGNOSIS — Z950 Presence of cardiac pacemaker: Secondary | ICD-10-CM

## 2019-04-06 DIAGNOSIS — I480 Paroxysmal atrial fibrillation: Secondary | ICD-10-CM

## 2019-04-06 DIAGNOSIS — I495 Sick sinus syndrome: Secondary | ICD-10-CM | POA: Diagnosis not present

## 2019-04-06 DIAGNOSIS — I5032 Chronic diastolic (congestive) heart failure: Secondary | ICD-10-CM | POA: Diagnosis not present

## 2019-04-06 MED ORDER — METOPROLOL TARTRATE 50 MG PO TABS: 75.0000 mg | ORAL_TABLET | Freq: Two times a day (BID) | ORAL | 3 refills | Status: DC

## 2019-04-06 MED ORDER — METOPROLOL TARTRATE 50 MG PO TABS: 50.0000 mg | ORAL_TABLET | Freq: Two times a day (BID) | ORAL | 3 refills | Status: DC

## 2019-04-06 NOTE — Progress Notes (Signed)
Cardiology Office Note:    Date:  04/06/2019   ID:  Gabrielle, [name] 1945/06/11, MRN [id number]  PCP:  Henrine Screws, MD  Cardiologist:  Donato Schultz, MD  Electrophysiologist:  None   Referring MD: Henrine Screws, MD     History of Present Illness:    Gabrielle [name] is a 74 y.o. female here for evaluation of shortness of breath. Increased use of inhalers. Has not seen pulmonary. NYHA 3. Sit down to catch breath. No CP, no syncope. She is starting to feel better.   In review of old notes: coronary artery disease status post bypass surgery in 2010, paroxysmal atrial fibrillation, tachycardia-bradycardia syndrome with pacemaker placement, EF 45-50%. COPD, obesity who unfortunately was hospitalized multiple times in 2017 with significant lower GI bleed, bright red blood, hematochezia with hemoglobin of 4 requiring a total of 6 units of packed red blood cells, 4 during initial admission and 2 during a repeat admission after she had hypotension and more blood loss. Dr. Randa Evens GI. Friable mucosa noted on colonoscopy. Presumed diverticular bleed. She has been off of her Eliquis since then. Please see below for full details.  She had prolonged QT as well, previously stopped Celexa and sotalol. Now back on Celexa  [place] twin.  06/30/16-she was hospitalized again in March with delirium, taking Xanax, muscle relaxants, had UTI as well. Hemoglobin remained 8.5 on discharge. 2 low for anticoagulation. See below. No chest pain, baseline shortness of breath noted.  12/30/16 -She was hospitalized for shortness of breath on 08/19/16.  BNP was 1100.  Chest x-ray showed pulmonary vascular congestion.  IV Lasix significantly improved symptoms.  Discharged with 40 mg twice a day.  The exacerbation was likely tipped off by increasing Gatorade use.  Her ejection fraction was 55% previously. Her 76 year old Poodle "Zizh" died.  She has been drinking more Gatorade.  She has not been taking her Lasix.   Weight is up 8- 10pounds.  04/23/17 -She was quite depressed being off the antidepressants which were stopped previously because of prolonged QT.  Now she is back on Celexa.  She has been off of the sotalol because of prolonged QT.  No chest pain, no syncope.  She has had almost a 40 pound weight gain after taking a psoriasis medication.  She wakes up and eats peanut butter and jelly sandwiches and drinks lemonade.  04/06/2019 -Here for the follow-up of atrial fibrillation.  She is in rapid ventricular response heart rate in the 160s to 170s.  Had pacemaker interrogation by Dr. Ladona Ridgel.  This is paroxysmal.  She is not excessively short of breath although she feels tired most of the time even though she is in and out of atrial fibrillation.  Please see below for details.  I discussed the plan, medical management personally with Dr. Ladona Ridgel.  Most recent creatinine 0.89 hemoglobin 13.1 ALT 20    Past Medical History:  Diagnosis Date  . Anemia   . Arthritis   . Asthma   . Atrial fibrillation with controlled ventricular response (HCC) 11/2009   a. not on anticoagulation due to recurrent GI bleeding while on Eliquis.  . Chronic bronchitis   . Chronic diastolic CHF (congestive heart failure) (HCC)    a. Echo 12/2015: EF 55-60% w/ Grade 3 DD  . Chronic headache   . COPD (chronic obstructive pulmonary disease) (HCC)   . Depression   . Heart attack (HCC)   . Hyperlipidemia   . Hypertension  Past Surgical History:  Procedure Laterality Date  . CAD-CABG     x7, brief post-op Atrial Fib  . CESAREAN SECTION     x2  . COLONOSCOPY WITH PROPOFOL N/A 10/24/2015   Procedure: COLONOSCOPY WITH PROPOFOL;  Surgeon: Carman Ching, MD;  Location: [facility] ENDOSCOPY;  Service: Endoscopy;  Laterality: N/A;  . CORONARY ARTERY BYPASS GRAFT  2011  . EP IMPLANTABLE DEVICE N/A 12/26/2015   Procedure: Pacemaker Implant;  Surgeon: Marinus Maw, MD;  Location: [facility] INVASIVE CV LAB;  Service: Cardiovascular;   Laterality: N/A;  . ESOPHAGOGASTRODUODENOSCOPY N/A 10/22/2015   Procedure: ESOPHAGOGASTRODUODENOSCOPY (EGD);  Surgeon: Carman Ching, MD;  Location: [facility] ENDOSCOPY;  Service: Endoscopy;  Laterality: N/A;  . ESOPHAGOGASTRODUODENOSCOPY N/A 12/24/2015   Procedure: ESOPHAGOGASTRODUODENOSCOPY (EGD);  Surgeon: Carman Ching, MD;  Location: [facility] ENDOSCOPY;  Service: Endoscopy;  Laterality: N/A;  . JOINT REPLACEMENT Bilateral   . TONSILLECTOMY    . TOTAL KNEE ARTHROPLASTY      Current Medications: Current Meds  Medication Sig  . Acetaminophen (TYLENOL 8 HOUR ARTHRITIS PAIN PO) Take 2 tablets by mouth 2 (two) times daily. Back pain  . albuterol (PROVENTIL HFA;VENTOLIN HFA) 108 (90 Base) MCG/ACT inhaler Inhale 2 puffs into the lungs every 6 (six) hours as needed for wheezing or shortness of breath.  . ALPRAZolam (XANAX) 1 MG tablet Take 1 tablet by mouth daily.  Marland Kitchen atorvastatin (LIPITOR) 20 MG tablet Take 1 tablet (20 mg total) by mouth daily at 6 PM.  . budesonide-formoterol (SYMBICORT) 160-4.5 MCG/ACT inhaler Inhale 2 puffs into the lungs 2 (two) times daily.  . citalopram (CELEXA) 20 MG tablet Take 20 mg by mouth daily.  . clobetasol cream (TEMOVATE) 0.05 % Apply 1 application topically 2 (two) times daily. Affected skin  . COSENTYX SENSOREADY, 300 MG, 150 MG/ML SOAJ Inject 2 Syringes into the skin every 28 (twenty-eight) days.  . diclofenac sodium (VOLTAREN) 1 % GEL Apply 4 g topically 4 (four) times daily.  Marland Kitchen diltiazem (TIAZAC) 120 MG 24 hr capsule Take 1 capsule (120 mg total) by mouth daily.  Marland Kitchen docusate sodium (COLACE) 100 MG capsule Take 100 mg by mouth 2 (two) times daily as needed for moderate constipation.   . fluticasone (FLONASE) 50 MCG/ACT nasal spray Place 2 sprays into both nostrils daily.  . furosemide (LASIX) 20 MG tablet Take 1 tablet (20 mg total) by mouth daily.  Marland Kitchen guaiFENesin-dextromethorphan (ROBITUSSIN DM) 100-10 MG/5ML syrup Take 5 mLs by mouth every 4 (four) hours as needed for  cough (chest congestion).  . Melatonin 10 MG TABS Take 10 mg by mouth at bedtime.  . metoprolol tartrate (LOPRESSOR) 50 MG tablet Take 1.5 tablets (75 mg total) by mouth 2 (two) times daily.  . pantoprazole (PROTONIX) 40 MG tablet Take 1 tablet (40 mg total) by mouth daily.  . potassium chloride SA (KLOR-CON) 20 MEQ tablet Take 1 tablet (20 mEq total) by mouth daily.  . predniSONE (DELTASONE) 20 MG tablet Take 1 tablet (20 mg total) by mouth daily with breakfast.  . tiZANidine (ZANAFLEX) 4 MG tablet Take 4 mg by mouth 2 (two) times daily.     Allergies:   Novocain [procaine] and Penicillins   Social History   Socioeconomic History  . Marital status: Divorced    Spouse name: Not on file  . Number of children: Not on file  . Years of education: Not on file  . Highest education level: Not on file  Occupational History  . Occupation: retired Clinical biochemist  Employer: [employer]  Tobacco Use  . Smoking status: Current Some Day Smoker    Packs/day: 0.20    Types: Cigarettes  . Smokeless tobacco: Never Used  . Tobacco comment: "Occasionally I will smoke a cigarette"   Substance and Sexual Activity  . Alcohol use: No    Alcohol/week: 0.0 standard drinks  . Drug use: No  . Sexual activity: Not on file  Other Topics Concern  . Not on file  Social History Narrative  . Not on file   Social Determinants of Health   Financial Resource Strain:   . Difficulty of Paying Living Expenses: Not on file  Food Insecurity:   . Worried About Programme researcher, broadcasting/film/video in the Last Year: Not on file  . Ran Out of Food in the Last Year: Not on file  Transportation Needs:   . Lack of Transportation (Medical): Not on file  . Lack of Transportation (Non-Medical): Not on file  Physical Activity:   . Days of Exercise per Week: Not on file  . Minutes of Exercise per Session: Not on file  Stress:   . Feeling of Stress : Not on file  Social Connections:   . Frequency of Communication with Friends and  Family: Not on file  . Frequency of Social Gatherings with Friends and Family: Not on file  . Attends Religious Services: Not on file  . Active Member of Clubs or Organizations: Not on file  . Attends Banker Meetings: Not on file  . Marital Status: Not on file     Family History: The patient's family history includes Diabetes in her mother; Heart attack in her father; Heart disease in her father and mother; Hypertension in her daughter, father, and mother; Lung cancer in her maternal grandfather and paternal grandfather; Stroke in her paternal grandmother.  ROS:   Please see the history of present illness.     All other systems reviewed and are negative.  EKGs/Labs/Other Studies Reviewed:    The following studies were reviewed today:  ECHO:05/17/16:  - Left ventricle: The cavity size was mildly dilated. Systolic function was normal. The estimated ejection fraction was 55%. There is akinesis of the apical myocardium. There is akinesis of the basalinferior myocardium. There is akinesis of the apicalanterior myocardium. There was a reduced contribution of atrial contraction to ventricular filling, due to increased ventricular diastolic pressure or atrial contractile dysfunction. Doppler parameters are consistent with a reversible restrictive pattern, indicative of decreased left ventricular diastolic compliance and/or increased left atrial pressure (grade 3 diastolic dysfunction). Doppler parameters are consistent with high ventricular filling pressure. - Aortic valve: There was mild regurgitation. - Mitral valve: Severely calcified annulus. There was moderate regurgitation directed posteriorly. Mean gradient (D): 7 mm Hg. - Left atrium: The atrium was mildly dilated. - Right ventricle: Pacer wire or catheter noted in right ventricle. - Right atrium: The atrium was mildly dilated. - Tricuspid valve: There was moderate regurgitation. -  Pulmonic valve: There was mild regurgitation. - Pulmonary arteries: PA peak pressure: 59 mm Hg (S).  Impressions:  - The right ventricular systolic pressure was increased consistent with moderate pulmonary hypertension.   EKG:  EKG is not ordered today.    Recent Labs: 02/21/2019: B Natriuretic Peptide 484.5 02/22/2019: TSH 1.688 02/24/2019: Magnesium 2.1 02/28/2019: ALT 20 03/01/2019: BUN 22; Creatinine, Ser 0.89; Hemoglobin 13.1; Platelets 147; Potassium 3.5; Sodium 136  Recent Lipid Panel    Component Value Date/Time   CHOL  11/28/2009 0343  112        ATP III CLASSIFICATION:  <200     mg/dL   Desirable  200-239  mg/dL   Borderline High  >=240    mg/dL   High          TRIG 82 02/21/2019 2212   HDL 52 11/28/2009 0343   CHOLHDL 2.2 11/28/2009 0343   VLDL 20 11/28/2009 0343   LDLCALC  11/28/2009 0343    40        Total Cholesterol/HDL:CHD Risk Coronary Heart Disease Risk Table                     Men   Women  1/2 Average Risk   3.4   3.3  Average Risk       5.0   4.4  2 X Average Risk   9.6   7.1  3 X Average Risk  23.4   11.0        Use the calculated Patient Ratio above and the CHD Risk Table to determine the patient's CHD Risk.        ATP III CLASSIFICATION (LDL):  <100     mg/dL   Optimal  100-129  mg/dL   Near or Above                    Optimal  130-159  mg/dL   Borderline  160-189  mg/dL   High  >190     mg/dL   Very High    Physical Exam:    VS:  BP 128/80   Pulse (!) 164   Ht 5\' 2"  (1.575 m)   Wt 201 lb 6.4 oz (91.4 kg)   LMP  (LMP Unknown)   BMI 36.84 kg/m     Wt Readings from Last 3 Encounters:  04/06/19 201 lb 6.4 oz (91.4 kg)  04/06/19 201 lb 6.4 oz (91.4 kg)  02/21/19 197 lb (89.4 kg)     GEN: Well nourished, well developed, in no acute distress in wheelchair HEENT: normal  Neck: no JVD, carotid bruits, or masses Cardiac: Tachy IRRR; no murmurs, rubs, or gallops,no edema  Respiratory:  clear to auscultation bilaterally,  normal work of breathing GI: soft, nontender, nondistended, + BS MS: no deformity or atrophy  Skin: warm and dry, no rash Neuro:  Alert and Oriented x 3, Strength and sensation are intact Psych: euthymic mood, full affect   ASSESSMENT:    1. Chronic diastolic heart failure (HCC)   2. Paroxysmal atrial fibrillation ([place])   3. Pacemaker    PLAN:    In order of problems listed above:  Paroxysmal atrial fibrillation  - ECG on 05/16/16 shows sinus rhythm however recent interrogation demonstrated atrial fibrillation on May 2 for approximately 2 hours with rapid ventricular response.  Seen again in April briefly.  Today however her heart rates are in the 170s over the past 3 hours as she was seeing Dr. Lovena Le for pacemaker interrogation.  She feels terrible.  Does not appear to be fluid overloaded at this time.  I discussed the case with Dr. Lovena Le and we decided to plan on increasing her metoprolol.  Hopefully this will help slow her heart rate down.  She has been paroxysmal so she may auto convert.  We discussed potential hospitalization however she would really like to try to stay out of the hospital at this time.  I think we can hold off at this point.  We will see  her promptly on Monday and if she is still tachycardic, we will get her set up for an AV nodal ablation with Dr. Ladona Ridgel on Wednesday.  She will need Covid testing on Monday.  - She has been off of sotalol.  I do not think she is a candidate for any further anticoagulation.  - Pacemaker previously placed because of bradycardia.     Chronic anti-coagulation Previously on Eliquis but she had severe bleeding, 4 units of blood then repeat admission again with 2 units of blood with hemoglobin of 4 on admit. Friable tissue on colonoscopy. Recurrent hematochezia.  - Prior hemoglobin is 8.5, this has resolved.  - I think the risks of resuming anticoagulation outweigh the benefit.  - She understands that she is at risk for stroke. She also  has had delirium as well.   Coronary artery disease  - Post CABG, stable, no angina despite tachycardia  Sick sinus syndrome-pacemaker placement  -Trouble with RVR episodes.  May need AV node ablation.  See above.  COPD  -  Per primary team  - Continue to encourage smoking cessation.  Smoking again.    Certainly her shortness of breath could be related to her underlying lung condition as well.  Chronic diastolic heart failure  -Continue with furosemide, does not actually appear to be fluid overloaded right now.  Her weight is increased this may be lack of movement however.   Medication Adjustments/Labs and Tests Ordered: Current medicines are reviewed at length with the patient today.  Concerns regarding medicines are outlined above.  No orders of the defined types were placed in this encounter.  No orders of the defined types were placed in this encounter.   Patient Instructions  Medication Instructions:  Please follow the medications instructions for Metoprolol as listed on paperwork from Dr Ladona Ridgel today. Continue all other medications as listed.  *If you need a refill on your cardiac medications before your next appointment, please call your pharmacy*  Follow-Up: Follow up with Dr Anne Fu on Monday as scheduled.  Thank you for choosing [facility]!!        Signed, Donato Schultz, MD  04/06/2019 4:47 PM    [facility]

## 2019-04-06 NOTE — Patient Instructions (Signed)
Medication Instructions:  Your physician has recommended you make the following change in your medication:   We are increasing your metoprolol tartrate 50 mg--we are increasing you to metoprolol tartrate 75 mg (1.5 tablets) by mouth twice a day.  HOWEVER-for the next 2 days I need you to take your metoprolol 3 times a day.  So- today when you get home take 2 tablets of your metoprolol tartrate.  Then tomorrow 04/07/19 take one tablet of your metoprolol tartrate in the AM, at lunch time and at night.  THEN STARTING 04/08/19 YOU will take 1.5 tablets (75 mg) by mouth twice a day.  Labwork: None ordered.  Testing/Procedures: None ordered.  Follow-Up: Your physician wants you to follow-up in: one year with Dr. Ladona Ridgel.   You will receive a reminder letter in the mail two months in advance. If you don't receive a letter, please call our office to schedule the follow-up appointment.  Remote monitoring is used to monitor your Pacemaker from home. This monitoring reduces the number of office visits required to check your device to one time per year. It allows [place] to keep an eye on the functioning of your device to ensure it is working properly. You are scheduled for a device check from home on 05/09/2019. You may send your transmission at any time that day. If you have a wireless device, the transmission will be sent automatically. After your physician reviews your transmission, you will receive a postcard with your next transmission date.  Any Other Special Instructions Will Be Listed Below (If Applicable).  If you need a refill on your cardiac medications before your next appointment, please call your pharmacy.

## 2019-04-06 NOTE — Progress Notes (Signed)
HPI [name] returns today for followup. She is a pleasant 74 yo woman with a h/o CAD, s/p CABG, sinus node dysfunction, s/p PPM insertion. In the interim, she notes dyspnea but does not have much in the was of palpitations. She has not had syncope. No edema. She feels weak and has worsening sob.  Allergies  Allergen Reactions  . Novocain [Procaine] Hives and Palpitations  . Penicillins Hives and Other (See Comments)    Has patient had a PCN reaction causing immediate rash, facial/tongue/throat swelling, SOB or lightheadedness with hypotension: No Has patient had a PCN reaction causing severe rash involving mucus membranes or skin necrosis: No Has patient had a PCN reaction that required hospitalization No Has patient had a PCN reaction occurring within the last 10 years: No If all of the above answers are "NO", then may proceed with Cephalosporin use.     Current Outpatient Medications  Medication Sig Dispense Refill  . Acetaminophen (TYLENOL 8 HOUR ARTHRITIS PAIN PO) Take 2 tablets by mouth 2 (two) times daily. Back pain    . albuterol (PROVENTIL HFA;VENTOLIN HFA) 108 (90 Base) MCG/ACT inhaler Inhale 2 puffs into the lungs every 6 (six) hours as needed for wheezing or shortness of breath.    Marland Kitchen atorvastatin (LIPITOR) 20 MG tablet Take 1 tablet (20 mg total) by mouth daily at 6 PM. 30 tablet 5  . budesonide-formoterol (SYMBICORT) 160-4.5 MCG/ACT inhaler Inhale 2 puffs into the lungs 2 (two) times daily. 10.2 g 6  . citalopram (CELEXA) 20 MG tablet Take 20 mg by mouth daily.  3  . clobetasol cream (TEMOVATE) 3.32 % Apply 1 application topically 2 (two) times daily. Affected skin    . COSENTYX SENSOREADY, 300 MG, 150 MG/ML SOAJ Inject 2 Syringes into the skin every 28 (twenty-eight) days.    . diclofenac sodium (VOLTAREN) 1 % GEL Apply 4 g topically 4 (four) times daily. 100 g 0  . diltiazem (TIAZAC) 120 MG 24 hr capsule Take 1 capsule (120 mg total) by mouth daily. 30 capsule 5    . docusate sodium (COLACE) 100 MG capsule Take 100 mg by mouth 2 (two) times daily as needed for moderate constipation.     . fluticasone (FLONASE) 50 MCG/ACT nasal spray Place 2 sprays into both nostrils daily. 16 g 0  . furosemide (LASIX) 20 MG tablet Take 1 tablet (20 mg total) by mouth daily. 30 tablet 5  . guaiFENesin-dextromethorphan (ROBITUSSIN DM) 100-10 MG/5ML syrup Take 5 mLs by mouth every 4 (four) hours as needed for cough (chest congestion). 118 mL 0  . Melatonin 10 MG TABS Take 10 mg by mouth at bedtime.    . metoprolol tartrate (LOPRESSOR) 50 MG tablet TAKE 1 TABLET AT BEDTIME; TAKE 1 ADDITIONAL TABLET AS NEEDED FOR PALPITATIONS 60 tablet 1  . pantoprazole (PROTONIX) 40 MG tablet Take 1 tablet (40 mg total) by mouth daily. 30 tablet 5  . potassium chloride SA (KLOR-CON) 20 MEQ tablet Take 1 tablet (20 mEq total) by mouth daily. 30 tablet 5  . predniSONE (DELTASONE) 20 MG tablet Take 1 tablet (20 mg total) by mouth daily with breakfast. 5 tablet 0   No current facility-administered medications for this visit.     Past Medical History:  Diagnosis Date  . Anemia   . Arthritis   . Asthma   . Atrial fibrillation with controlled ventricular response ([place]) 11/2009   a. not on anticoagulation due to recurrent GI bleeding while on Eliquis.  Marland Kitchen  Chronic bronchitis   . Chronic diastolic CHF (congestive heart failure) (HCC)    a. Echo 12/2015: EF 55-60% w/ Grade 3 DD  . Chronic headache   . COPD (chronic obstructive pulmonary disease) (HCC)   . Depression   . Heart attack (HCC)   . Hyperlipidemia   . Hypertension     ROS:   All systems reviewed and negative except as noted in the HPI.   Past Surgical History:  Procedure Laterality Date  . CAD-CABG     x7, brief post-op Atrial Fib  . CESAREAN SECTION     x2  . COLONOSCOPY WITH PROPOFOL N/A 10/24/2015   Procedure: COLONOSCOPY WITH PROPOFOL;  Surgeon: Carman Ching, MD;  Location: [facility] ENDOSCOPY;  Service: Endoscopy;   Laterality: N/A;  . CORONARY ARTERY BYPASS GRAFT  2011  . EP IMPLANTABLE DEVICE N/A 12/26/2015   Procedure: Pacemaker Implant;  Surgeon: Marinus Maw, MD;  Location: [facility] INVASIVE CV LAB;  Service: Cardiovascular;  Laterality: N/A;  . ESOPHAGOGASTRODUODENOSCOPY N/A 10/22/2015   Procedure: ESOPHAGOGASTRODUODENOSCOPY (EGD);  Surgeon: Carman Ching, MD;  Location: [facility] ENDOSCOPY;  Service: Endoscopy;  Laterality: N/A;  . ESOPHAGOGASTRODUODENOSCOPY N/A 12/24/2015   Procedure: ESOPHAGOGASTRODUODENOSCOPY (EGD);  Surgeon: Carman Ching, MD;  Location: [facility] ENDOSCOPY;  Service: Endoscopy;  Laterality: N/A;  . JOINT REPLACEMENT Bilateral   . TONSILLECTOMY    . TOTAL KNEE ARTHROPLASTY       Family History  Problem Relation Age of Onset  . Diabetes Mother   . Hypertension Mother   . Heart disease Mother        before age 18  . Heart disease Father        before age 26  . Hypertension Father   . Heart attack Father   . Lung cancer Maternal Grandfather   . Lung cancer Paternal Grandfather   . Hypertension Daughter   . Stroke Paternal Grandmother      Social History   Socioeconomic History  . Marital status: Divorced    Spouse name: Not on file  . Number of children: Not on file  . Years of education: Not on file  . Highest education level: Not on file  Occupational History  . 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 Smoking status: Current Some Day Smoker    Packs/day: 0.20    Types: Cigarettes  . Smokeless tobacco: Never Used  . Tobacco comment: "Occasionally I will smoke a cigarette"   Substance and Sexual Activity  . Alcohol use: No    Alcohol/week: 0.0 standard drinks  . Drug use: No  . Sexual activity: Not on file  Other Topics Concern  . Not on file  Social History Narrative  . Not on file   Social Determinants of Health   Financial Resource Strain:   . Difficulty of Paying Living Expenses: Not on file  Food Insecurity:   . Worried  About Programme researcher, broadcasting/film/video in the Last Year: Not on file  . Ran Out of Food in the Last Year: Not on file  Transportation Needs:   . Lack of Transportation (Medical): Not on file  . Lack of Transportation (Non-Medical): Not on file  Physical Activity:   . Days of Exercise per Week: Not on file  . Minutes of Exercise per Session: Not on file  Stress:   . Feeling of Stress : Not on file  Social Connections:   . Frequency of Communication with Friends and Family: Not on  file  . Frequency of Social Gatherings with Friends and Family: Not on file  . Attends Religious Services: Not on file  . Active Member of Clubs or Organizations: Not on file  . Attends Banker Meetings: Not on file  . Marital Status: Not on file  Intimate Partner Violence:   . Fear of Current or Ex-Partner: Not on file  . Emotionally Abused: Not on file  . Physically Abused: Not on file  . Sexually Abused: Not on file     BP - 128/80, P - 162, R - 20 Physical Exam:  Well appearing NAD HEENT: Unremarkable Neck:  No JVD, no thyromegally Lymphatics:  No adenopathy Back:  No CVA tenderness Lungs:  Clear with no wheezes HEART:  Regular rate rhythm, no murmurs, no rubs, no clicks Abd:  soft, positive bowel sounds, no organomegally, no rebound, no guarding Ext:  2 plus pulses, no edema, no cyanosis, no clubbing Skin:  No rashes no nodules Neuro:  CN II through XII intact, motor grossly intact  EKG - none  DEVICE  Normal device function.  See PaceArt for details. Atrial fib with a RVR  Assess/Plan: 1. Atrial fib with a RVR - she has taken 50 of lopressor earlier today and I told her to go home and take a 100 mg, then 75 bid. If she does not get better with regard to her VR, she will need AV node ablation. 2. Chronic diastolic heart failure - despite her rapid atrial fib, she does not appear decompensated on exam. She will need  better rate control. I have asked her to take additional beta blocker as  above.  3. CAD - she denies anginal symptoms despite her rapid HR.  4. PPM - her Medtronic DDD PM is working normally. She has been out of rhythm for over a day.  Leonia Reeves.D.

## 2019-04-06 NOTE — Patient Instructions (Signed)
Medication Instructions:  Please follow the medications instructions for Metoprolol as listed on paperwork from Dr Ladona Ridgel today. Continue all other medications as listed.  *If you need a refill on your cardiac medications before your next appointment, please call your pharmacy*  Follow-Up: Follow up with Dr Anne Fu on Monday as scheduled.  Thank you for choosing [facility]!!

## 2019-04-10 ENCOUNTER — Other Ambulatory Visit: Payer: Self-pay

## 2019-04-10 ENCOUNTER — Encounter: Payer: Self-pay | Admitting: Cardiology

## 2019-04-10 ENCOUNTER — Ambulatory Visit (INDEPENDENT_AMBULATORY_CARE_PROVIDER_SITE_OTHER): Payer: Medicare Other | Admitting: Cardiology

## 2019-04-10 VITALS — BP 90/60 | HR 84 | Ht 62.0 in | Wt 200.0 lb

## 2019-04-10 DIAGNOSIS — I2583 Coronary atherosclerosis due to lipid rich plaque: Secondary | ICD-10-CM

## 2019-04-10 DIAGNOSIS — I495 Sick sinus syndrome: Secondary | ICD-10-CM | POA: Diagnosis not present

## 2019-04-10 DIAGNOSIS — Z950 Presence of cardiac pacemaker: Secondary | ICD-10-CM

## 2019-04-10 DIAGNOSIS — I480 Paroxysmal atrial fibrillation: Secondary | ICD-10-CM | POA: Diagnosis not present

## 2019-04-10 DIAGNOSIS — I2510 Atherosclerotic heart disease of native coronary artery without angina pectoris: Secondary | ICD-10-CM | POA: Diagnosis not present

## 2019-04-10 NOTE — Progress Notes (Signed)
Cardiology Office Note:    Date:  04/10/2019   ID:  Burgundy, Matuszak 1945-04-11, MRN [id number]  PCP:  Aura Dials, MD  Cardiologist:  Candee Furbish, MD  Electrophysiologist:  None   Referring MD: Aura Dials, MD     History of Present Illness:    Gabrielle [name] is a 74 y.o. female here for evaluation of shortness of breath. Increased use of inhalers. Has not seen pulmonary. NYHA 3. Sit down to catch breath. No CP, no syncope. She is starting to feel better.   In review of old notes: coronary artery disease status post bypass surgery in 2010, paroxysmal atrial fibrillation, tachycardia-bradycardia syndrome with pacemaker placement, EF 45-50%. COPD, obesity who unfortunately was hospitalized multiple times in 2017 with significant lower GI bleed, bright red blood, hematochezia with hemoglobin of 4 requiring a total of 6 units of packed red blood cells, 4 during initial admission and 2 during a repeat admission after she had hypotension and more blood loss. Dr. Oletta Lamas GI. Friable mucosa noted on colonoscopy. Presumed diverticular bleed. She has been off of her Eliquis since then. Please see below for full details.  She had prolonged QT as well, previously stopped Celexa and sotalol. Now back on [place] twin.  06/30/16-she was hospitalized again in March with delirium, taking Xanax, muscle relaxants, had UTI as well. Hemoglobin remained 8.5 on discharge. 2 low for anticoagulation. See below. No chest pain, baseline shortness of breath noted.  12/30/16 -She was hospitalized for shortness of breath on 08/19/16.  BNP was 1100.  Chest x-ray showed pulmonary vascular congestion.  IV Lasix significantly improved symptoms.  Discharged with 40 mg twice a day.  The exacerbation was likely tipped off by increasing Gatorade use.  Her ejection fraction was 55% previously. Her 76 year old Poodle "Zizh" died.  She has been drinking more Gatorade.  She has not been taking her Lasix.   Weight is up 8- 10pounds.  04/23/17 -She was quite depressed being off the antidepressants which were stopped previously because of prolonged QT.  Now she is back on Celexa.  She has been off of the sotalol because of prolonged QT.  No chest pain, no syncope.  She has had almost a 40 pound weight gain after taking a psoriasis medication.  She wakes up and eats peanut butter and jelly sandwiches and drinks lemonade.  04/06/2019 -Here for the follow-up of atrial fibrillation.  She is in rapid ventricular response heart rate in the 160s to 170s.  Had pacemaker interrogation by Dr. Lovena Le.  This is paroxysmal.  She is not excessively short of breath although she feels tired most of the time even though she is in and out of atrial fibrillation.  Please see below for details.  I discussed the plan, medical management personally with Dr. Lovena Le.  Most recent creatinine 0.89 hemoglobin 13.1 ALT 20  04/10/2019 -Here for close follow-up.  At last office visit she was in atrial fibrillation with rapid ventricular response.  Pacemaker check verify this.  Today we are seeing her back.  Plan was that if she was still in rapid A. fib that we would set her up for AV nodal ablation with Dr. Lovena Le on Wednesday.  Last visit we increased her metoprolol.  Looks like we do have some success.  Her EKG today is 84 bpm.  Variable conduction noted.  Much improved.  Feels better, less short of breath.   Past Medical History:  Diagnosis Date  . Anemia   .  Arthritis   . Asthma   . Atrial fibrillation with controlled ventricular response (HCC) 11/2009   a. not on anticoagulation due to recurrent GI bleeding while on Eliquis.  . Chronic bronchitis   . Chronic diastolic CHF (congestive heart failure) (HCC)    a. Echo 12/2015: EF 55-60% w/ Grade 3 DD  . Chronic headache   . COPD (chronic obstructive pulmonary disease) (HCC)   . Depression   . Heart attack (HCC)   . Hyperlipidemia   . Hypertension     Past Surgical  History:  Procedure Laterality Date  . CAD-CABG     x7, brief post-op Atrial Fib  . CESAREAN SECTION     x2  . COLONOSCOPY WITH PROPOFOL N/A 10/24/2015   Procedure: COLONOSCOPY WITH PROPOFOL;  Surgeon: Carman Ching, MD;  Location: [facility] ENDOSCOPY;  Service: Endoscopy;  Laterality: N/A;  . CORONARY ARTERY BYPASS GRAFT  2011  . EP IMPLANTABLE DEVICE N/A 12/26/2015   Procedure: Pacemaker Implant;  Surgeon: Marinus Maw, MD;  Location: [facility] INVASIVE CV LAB;  Service: Cardiovascular;  Laterality: N/A;  . ESOPHAGOGASTRODUODENOSCOPY N/A 10/22/2015   Procedure: ESOPHAGOGASTRODUODENOSCOPY (EGD);  Surgeon: Carman Ching, MD;  Location: [facility] ENDOSCOPY;  Service: Endoscopy;  Laterality: N/A;  . ESOPHAGOGASTRODUODENOSCOPY N/A 12/24/2015   Procedure: ESOPHAGOGASTRODUODENOSCOPY (EGD);  Surgeon: Carman Ching, MD;  Location: [facility] ENDOSCOPY;  Service: Endoscopy;  Laterality: N/A;  . JOINT REPLACEMENT Bilateral   . TONSILLECTOMY    . TOTAL KNEE ARTHROPLASTY      Current Medications: Current Meds  Medication Sig  . Acetaminophen (TYLENOL 8 HOUR ARTHRITIS PAIN PO) Take 2 tablets by mouth 2 (two) times daily. Back pain  . albuterol (PROVENTIL HFA;VENTOLIN HFA) 108 (90 Base) MCG/ACT inhaler Inhale 2 puffs into the lungs every 6 (six) hours as needed for wheezing or shortness of breath.  . ALPRAZolam (XANAX) 1 MG tablet Take 1 tablet by mouth daily.  Marland Kitchen atorvastatin (LIPITOR) 20 MG tablet Take 1 tablet (20 mg total) by mouth daily at 6 PM.  . budesonide-formoterol (SYMBICORT) 160-4.5 MCG/ACT inhaler Inhale 2 puffs into the lungs 2 (two) times daily.  . citalopram (CELEXA) 20 MG tablet Take 20 mg by mouth daily.  . clobetasol cream (TEMOVATE) 0.05 % Apply 1 application topically 2 (two) times daily. Affected skin  . COSENTYX SENSOREADY, 300 MG, 150 MG/ML SOAJ Inject 2 Syringes into the skin every 28 (twenty-eight) days.  . diclofenac sodium (VOLTAREN) 1 % GEL Apply 4 g topically 4 (four) times daily.  Marland Kitchen diltiazem  (TIAZAC) 120 MG 24 hr capsule Take 1 capsule (120 mg total) by mouth daily.  Marland Kitchen docusate sodium (COLACE) 100 MG capsule Take 100 mg by mouth 2 (two) times daily as needed for moderate constipation.   . fluticasone (FLONASE) 50 MCG/ACT nasal spray Place 2 sprays into both nostrils daily.  . furosemide (LASIX) 20 MG tablet Take 1 tablet (20 mg total) by mouth daily.  Marland Kitchen guaiFENesin-dextromethorphan (ROBITUSSIN DM) 100-10 MG/5ML syrup Take 5 mLs by mouth every 4 (four) hours as needed for cough (chest congestion).  . Melatonin 10 MG TABS Take 10 mg by mouth at bedtime.  . metoprolol tartrate (LOPRESSOR) 50 MG tablet Take 1.5 tablets (75 mg total) by mouth 2 (two) times daily.  . pantoprazole (PROTONIX) 40 MG tablet Take 1 tablet (40 mg total) by mouth daily.  . potassium chloride SA (KLOR-CON) 20 MEQ tablet Take 1 tablet (20 mEq total) by mouth daily.  Marland Kitchen tiZANidine (ZANAFLEX) 4 MG tablet Take 4 mg  by mouth 2 (two) times daily.     Allergies:   Novocain [procaine] and Penicillins   Social History   Socioeconomic History  . Marital status: Divorced    Spouse name: Not on file  . Number of children: Not on file  . Years of education: Not on file  . Highest education level: Not on file  Occupational History  . 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 Smoking status: Current Some Day Smoker    Packs/day: 0.20    Types: Cigarettes  . Smokeless tobacco: Never Used  . Tobacco comment: "Occasionally I will smoke a cigarette"   Substance and Sexual Activity  . Alcohol use: No    Alcohol/week: 0.0 standard drinks  . Drug use: No  . Sexual activity: Not on file  Other Topics Concern  . Not on file  Social History Narrative  . Not on file   Social Determinants of Health   Financial Resource Strain:   . Difficulty of Paying Living Expenses: Not on file  Food Insecurity:   . Worried About Programme researcher, broadcasting/film/video in the Last Year: Not on file  . Ran Out of Food in  the Last Year: Not on file  Transportation Needs:   . Lack of Transportation (Medical): Not on file  . Lack of Transportation (Non-Medical): Not on file  Physical Activity:   . Days of Exercise per Week: Not on file  . Minutes of Exercise per Session: Not on file  Stress:   . Feeling of Stress : Not on file  Social Connections:   . Frequency of Communication with Friends and Family: Not on file  . Frequency of Social Gatherings with Friends and Family: Not on file  . Attends Religious Services: Not on file  . Active Member of Clubs or Organizations: Not on file  . Attends Banker Meetings: Not on file  . Marital Status: Not on file     Family History: The patient's family history includes Diabetes in her mother; Heart attack in her father; Heart disease in her father and mother; Hypertension in her daughter, father, and mother; Lung cancer in her maternal grandfather and paternal grandfather; Stroke in her paternal grandmother.  ROS:   Please see the history of present illness.     All other systems reviewed and are negative.  EKGs/Labs/Other Studies Reviewed:    The following studies were reviewed today:  ECHO:05/17/16:  - Left ventricle: The cavity size was mildly dilated. Systolic function was normal. The estimated ejection fraction was 55%. There is akinesis of the apical myocardium. There is akinesis of the basalinferior myocardium. There is akinesis of the apicalanterior myocardium. There was a reduced contribution of atrial contraction to ventricular filling, due to increased ventricular diastolic pressure or atrial contractile dysfunction. Doppler parameters are consistent with a reversible restrictive pattern, indicative of decreased left ventricular diastolic compliance and/or increased left atrial pressure (grade 3 diastolic dysfunction). Doppler parameters are consistent with high ventricular filling pressure. - Aortic valve:  There was mild regurgitation. - Mitral valve: Severely calcified annulus. There was moderate regurgitation directed posteriorly. Mean gradient (D): 7 mm Hg. - Left atrium: The atrium was mildly dilated. - Right ventricle: Pacer wire or catheter noted in right ventricle. - Right atrium: The atrium was mildly dilated. - Tricuspid valve: There was moderate regurgitation. - Pulmonic valve: There was mild regurgitation. - Pulmonary arteries: PA peak pressure: 59 mm Hg (S).  Impressions:  -  The right ventricular systolic pressure was increased consistent with moderate pulmonary hypertension.   EKG:  EKG is not ordered today.    Recent Labs: 02/21/2019: B Natriuretic Peptide 484.5 02/22/2019: TSH 1.688 02/24/2019: Magnesium 2.1 02/28/2019: ALT 20 03/01/2019: BUN 22; Creatinine, Ser 0.89; Hemoglobin 13.1; Platelets 147; Potassium 3.5; Sodium 136  Recent Lipid Panel    Component Value Date/Time   CHOL  11/28/2009 0343    112        ATP III CLASSIFICATION:  <200     mg/dL   Desirable  034-917  mg/dL   Borderline High  >=915    mg/dL   High          TRIG 82 02/21/2019 2212   HDL 52 11/28/2009 0343   CHOLHDL 2.2 11/28/2009 0343   VLDL 20 11/28/2009 0343   LDLCALC  11/28/2009 0343    40        Total Cholesterol/HDL:CHD Risk Coronary Heart Disease Risk Table                     Men   Women  1/2 Average Risk   3.4   3.3  Average Risk       5.0   4.4  2 X Average Risk   9.6   7.1  3 X Average Risk  23.4   11.0        Use the calculated Patient Ratio above and the CHD Risk Table to determine the patient's CHD Risk.        ATP III CLASSIFICATION (LDL):  <100     mg/dL   Optimal  056-979  mg/dL   Near or Above                    Optimal  130-159  mg/dL   Borderline  480-165  mg/dL   High  >537     mg/dL   Very High    Physical Exam:    VS:  BP 90/60   Pulse 84   Ht 5\' 2"  (1.575 m)   Wt 200 lb (90.7 kg)   LMP  (LMP Unknown)   SpO2 95%   BMI 36.58 kg/m     Wt  Readings from Last 3 Encounters:  04/10/19 200 lb (90.7 kg)  04/06/19 201 lb 6.4 oz (91.4 kg)  04/06/19 201 lb 6.4 oz (91.4 kg)     GEN: Well nourished, well developed, in no acute distress in wheelchair HEENT: normal  Neck: no JVD, carotid bruits, or masses Cardiac: Normal heart rate now IRRR; no murmurs, rubs, or gallops,no edema  Respiratory:  clear to auscultation bilaterally, normal work of breathing GI: soft, nontender, nondistended, + BS MS: no deformity or atrophy  Skin: warm and dry, no rash Neuro:  Alert and Oriented x 3, Strength and sensation are intact Psych: euthymic mood, full affect   ASSESSMENT:    1. Coronary artery disease due to lipid rich plaque   2. Paroxysmal atrial fibrillation (HCC)   3. Tachy-brady syndrome (HCC)   4. Pacemaker    PLAN:    In order of problems listed above:  Paroxysmal atrial fibrillation -4 days ago, heart rates were over the 170 range.  Pacemaker interrogation by Dr. Ladona Ridgel noted these heart rates as well.  She felt terrible.  She was given additional Lasix for 3 days.  She was also given an increase of metoprolol to 75 twice daily.  Today her heart rate is 84 bpm.  She feels better.  Blood pressure is a little bit soft so we will pull back on the Lasix back to 20 mg once a day.  No need for AV nodal ablation at this point.   Chronic anti-coagulation not able to take Previously on Eliquis but she had severe bleeding, 4 units of blood then repeat admission again with 2 units of blood with hemoglobin of 4 on admit. Friable tissue on colonoscopy. Recurrent hematochezia.  - Prior hemoglobin is 8.5, this has resolved.  - I think the risks of resuming anticoagulation outweigh the benefit.  - She understands that she is at risk for stroke. She also has had delirium as well.   Coronary artery disease  -Doing well post CABG.  No angina.  She did not have any anginal symptoms even though she was tachycardic previously.  Sick sinus  syndrome-pacemaker placement  -Trouble with RVR episodes.  May need AV node ablation if future episodes do not improve.  Overall seems to be improved with metoprolol 75 twice daily.  Heart rate today on EKG 84 bpm.  COPD  -Continue to encourage tobacco cessation.  Chronic diastolic heart failure  -She was recently doubling up on her Lasix over the past few days.  She is down about 3 pounds.  Her blood pressure is a little bit soft.  I think her fluid status has improved.  Lets go back to 20 mg once a day.  Continue with daily weights.   Medication Adjustments/Labs and Tests Ordered: Current medicines are reviewed at length with the patient today.  Concerns regarding medicines are outlined above.  Orders Placed This Encounter  Procedures  . EKG 12-Lead   No orders of the defined types were placed in this encounter.   Patient Instructions  Medication Instructions:  Please take Furosemide once a day. Continue all other medications as listed.  *If you need a refill on your cardiac medications before your next appointment, please call your pharmacy*  Follow-Up: At [facility], you and your health needs are our priority.  As part of our continuing mission to provide you with exceptional heart care, we have created designated Provider Care Teams.  These Care Teams include your primary Cardiologist (physician) and Advanced Practice Providers (APPs -  Physician Assistants and Nurse Practitioners) who all work together to provide you with the care you need, when you need it.  Your next appointment:   3 month(s)  The format for your next appointment:   In Person  Provider:   Donato Schultz, MD  Thank you for choosing [facility]!!        Signed, Donato Schultz, MD  04/10/2019 3:13 PM    [facility]

## 2019-04-10 NOTE — Patient Instructions (Signed)
Medication Instructions:  Please take Furosemide once a day. Continue all other medications as listed.  *If you need a refill on your cardiac medications before your next appointment, please call your pharmacy*  Follow-Up: At [facility], you and your health needs are our priority.  As part of our continuing mission to provide you with exceptional heart care, we have created designated Provider Care Teams.  These Care Teams include your primary Cardiologist (physician) and Advanced Practice Providers (APPs -  Physician Assistants and Nurse Practitioners) who all work together to provide you with the care you need, when you need it.  Your next appointment:   3 month(s)  The format for your next appointment:   In Person  Provider:   Donato Schultz, MD  Thank you for choosing [facility]!!

## 2019-04-12 ENCOUNTER — Other Ambulatory Visit: Payer: Self-pay | Admitting: Cardiology

## 2019-04-12 MED ORDER — METOPROLOL TARTRATE 50 MG PO TABS: 75.0000 mg | ORAL_TABLET | Freq: Two times a day (BID) | ORAL | 3 refills | Status: AC

## 2019-04-27 ENCOUNTER — Telehealth: Payer: Self-pay | Admitting: Cardiology

## 2019-04-27 NOTE — Telephone Encounter (Signed)
Pt c/o medication issue:  1. Name of Medication: metoprolol tartrate (LOPRESSOR) 50 MG tablet  2. How are you currently taking this medication (dosage and times per day)? 1.5 tablets by mouth two times daily   3. Are you having a reaction (difficulty breathing--STAT)? No   4. What is your medication issue? [place] is calling stating the last two times she has received her medications from [facility] they have not sent her metoprolol. She states they just came in the mail again today without it and she will be running out of what she has left soon. Please advise.

## 2019-04-27 NOTE — Telephone Encounter (Signed)
Spoke with patient who is concerned b/c she has not received her Metoprolol "in 2 months" along with her other refills that come in the mail.   She states they tell her "it's too soon."  [employer] and spoke with Weston Brass - he verified they have not sent her any refills on metoprolol tartrate since 12/20 though they do have the most recent RX and sig of 50 mg 1 and 1/2 tablets BID.  Weston Brass tried to "run" the script through insurance and received a response "too soon" and take it can not be refilled until 3/21.  While reviewing pt's chart I do see where RX was sent into [facility] locally.  Weston Brass is aware and he will contact [facility] to see if and when the pt had medication filled there.  I called back and spoke with pt who reports if she has received it, she doesn't know where it is but she will look around for it.  Pt will call back with any further concerns.

## 2019-05-09 ENCOUNTER — Ambulatory Visit (INDEPENDENT_AMBULATORY_CARE_PROVIDER_SITE_OTHER): Payer: Medicare Other | Admitting: *Deleted

## 2019-05-09 DIAGNOSIS — I495 Sick sinus syndrome: Secondary | ICD-10-CM

## 2019-05-09 LAB — CUP PACEART REMOTE DEVICE CHECK
Battery Remaining Longevity: 96 mo
Battery Remaining Percentage: 93 %
Brady Statistic RA Percent Paced: 0 %
Brady Statistic RV Percent Paced: 23 %
Date Time Interrogation Session: 20210309001100
Implantable Lead Implant Date: 20171026
Implantable Lead Implant Date: 20171026
Implantable Lead Location: 753859
Implantable Lead Location: 753860
Implantable Lead Model: 7740
Implantable Lead Model: 7741
Implantable Lead Serial Number: 685941
Implantable Lead Serial Number: 818382
Implantable Pulse Generator Implant Date: 20171026
Lead Channel Impedance Value: 1128 Ohm
Lead Channel Impedance Value: 757 Ohm
Lead Channel Pacing Threshold Amplitude: 1.6 V
Lead Channel Pacing Threshold Pulse Width: 0.4 ms
Lead Channel Setting Pacing Amplitude: 2 V
Lead Channel Setting Pacing Amplitude: 2.4 V
Lead Channel Setting Pacing Pulse Width: 0.4 ms
Lead Channel Setting Sensing Sensitivity: 2.5 mV
Pulse Gen Serial Number: 766992

## 2019-05-09 NOTE — Progress Notes (Signed)
PPM Remote  

## 2019-05-16 ENCOUNTER — Other Ambulatory Visit: Payer: Self-pay

## 2019-05-16 ENCOUNTER — Emergency Department (HOSPITAL_COMMUNITY)
Admission: EM | Admit: 2019-05-16 | Discharge: 2019-05-16 | Discharge status: Absent without leave | Payer: Medicare Other

## 2019-05-16 ENCOUNTER — Emergency Department (HOSPITAL_COMMUNITY): Payer: Medicare Other

## 2019-05-16 ENCOUNTER — Inpatient Hospital Stay (HOSPITAL_COMMUNITY)
Admission: EM | Admit: 2019-05-16 | Discharge: 2019-05-26 | DRG: 291 | Disposition: A | Discharge status: Home Health | Payer: Medicare Other | Source: Ambulatory Visit | Attending: Internal Medicine | Admitting: Internal Medicine

## 2019-05-16 ENCOUNTER — Encounter (HOSPITAL_COMMUNITY): Payer: Self-pay | Admitting: Emergency Medicine

## 2019-05-16 DIAGNOSIS — Z801 Family history of malignant neoplasm of trachea, bronchus and lung: Secondary | ICD-10-CM

## 2019-05-16 DIAGNOSIS — K5730 Diverticulosis of large intestine without perforation or abscess without bleeding: Secondary | ICD-10-CM | POA: Diagnosis present

## 2019-05-16 DIAGNOSIS — Z888 Allergy status to other drugs, medicaments and biological substances status: Secondary | ICD-10-CM

## 2019-05-16 DIAGNOSIS — I2510 Atherosclerotic heart disease of native coronary artery without angina pectoris: Secondary | ICD-10-CM | POA: Diagnosis present

## 2019-05-16 DIAGNOSIS — I130 Hypertensive heart and chronic kidney disease with heart failure and stage 1 through stage 4 chronic kidney disease, or unspecified chronic kidney disease: Principal | ICD-10-CM | POA: Diagnosis present

## 2019-05-16 DIAGNOSIS — Z9981 Dependence on supplemental oxygen: Secondary | ICD-10-CM

## 2019-05-16 DIAGNOSIS — Z951 Presence of aortocoronary bypass graft: Secondary | ICD-10-CM

## 2019-05-16 DIAGNOSIS — Z96659 Presence of unspecified artificial knee joint: Secondary | ICD-10-CM | POA: Diagnosis present

## 2019-05-16 DIAGNOSIS — E785 Hyperlipidemia, unspecified: Secondary | ICD-10-CM | POA: Diagnosis present

## 2019-05-16 DIAGNOSIS — F17210 Nicotine dependence, cigarettes, uncomplicated: Secondary | ICD-10-CM | POA: Diagnosis present

## 2019-05-16 DIAGNOSIS — Z20822 Contact with and (suspected) exposure to covid-19: Secondary | ICD-10-CM | POA: Diagnosis present

## 2019-05-16 DIAGNOSIS — Z7951 Long term (current) use of inhaled steroids: Secondary | ICD-10-CM

## 2019-05-16 DIAGNOSIS — B370 Candidal stomatitis: Secondary | ICD-10-CM | POA: Diagnosis present

## 2019-05-16 DIAGNOSIS — Z8249 Family history of ischemic heart disease and other diseases of the circulatory system: Secondary | ICD-10-CM

## 2019-05-16 DIAGNOSIS — N179 Acute kidney failure, unspecified: Secondary | ICD-10-CM | POA: Diagnosis present

## 2019-05-16 DIAGNOSIS — D649 Anemia, unspecified: Secondary | ICD-10-CM | POA: Diagnosis present

## 2019-05-16 DIAGNOSIS — F329 Major depressive disorder, single episode, unspecified: Secondary | ICD-10-CM | POA: Diagnosis present

## 2019-05-16 DIAGNOSIS — E1165 Type 2 diabetes mellitus with hyperglycemia: Secondary | ICD-10-CM | POA: Diagnosis not present

## 2019-05-16 DIAGNOSIS — J441 Chronic obstructive pulmonary disease with (acute) exacerbation: Secondary | ICD-10-CM | POA: Diagnosis present

## 2019-05-16 DIAGNOSIS — N1831 Chronic kidney disease, stage 3a: Secondary | ICD-10-CM | POA: Diagnosis present

## 2019-05-16 DIAGNOSIS — R1310 Dysphagia, unspecified: Secondary | ICD-10-CM | POA: Diagnosis present

## 2019-05-16 DIAGNOSIS — J9601 Acute respiratory failure with hypoxia: Secondary | ICD-10-CM | POA: Diagnosis present

## 2019-05-16 DIAGNOSIS — I4891 Unspecified atrial fibrillation: Secondary | ICD-10-CM | POA: Diagnosis present

## 2019-05-16 DIAGNOSIS — Z833 Family history of diabetes mellitus: Secondary | ICD-10-CM

## 2019-05-16 DIAGNOSIS — I252 Old myocardial infarction: Secondary | ICD-10-CM | POA: Diagnosis not present

## 2019-05-16 DIAGNOSIS — I5033 Acute on chronic diastolic (congestive) heart failure: Secondary | ICD-10-CM | POA: Diagnosis present

## 2019-05-16 DIAGNOSIS — E782 Mixed hyperlipidemia: Secondary | ICD-10-CM | POA: Diagnosis not present

## 2019-05-16 DIAGNOSIS — I4821 Permanent atrial fibrillation: Secondary | ICD-10-CM | POA: Diagnosis present

## 2019-05-16 DIAGNOSIS — I2583 Coronary atherosclerosis due to lipid rich plaque: Secondary | ICD-10-CM | POA: Diagnosis present

## 2019-05-16 DIAGNOSIS — J9811 Atelectasis: Secondary | ICD-10-CM | POA: Diagnosis present

## 2019-05-16 DIAGNOSIS — K7460 Unspecified cirrhosis of liver: Secondary | ICD-10-CM | POA: Diagnosis present

## 2019-05-16 DIAGNOSIS — Z6835 Body mass index (BMI) 35.0-35.9, adult: Secondary | ICD-10-CM | POA: Diagnosis not present

## 2019-05-16 DIAGNOSIS — Z880 Allergy status to penicillin: Secondary | ICD-10-CM

## 2019-05-16 DIAGNOSIS — Z823 Family history of stroke: Secondary | ICD-10-CM

## 2019-05-16 DIAGNOSIS — R7989 Other specified abnormal findings of blood chemistry: Secondary | ICD-10-CM | POA: Diagnosis not present

## 2019-05-16 DIAGNOSIS — Z79899 Other long term (current) drug therapy: Secondary | ICD-10-CM

## 2019-05-16 DIAGNOSIS — R519 Headache, unspecified: Secondary | ICD-10-CM | POA: Diagnosis present

## 2019-05-16 DIAGNOSIS — E669 Obesity, unspecified: Secondary | ICD-10-CM | POA: Diagnosis present

## 2019-05-16 DIAGNOSIS — F32A Depression, unspecified: Secondary | ICD-10-CM | POA: Diagnosis present

## 2019-05-16 DIAGNOSIS — T380X5A Adverse effect of glucocorticoids and synthetic analogues, initial encounter: Secondary | ICD-10-CM | POA: Diagnosis not present

## 2019-05-16 DIAGNOSIS — I509 Heart failure, unspecified: Secondary | ICD-10-CM

## 2019-05-16 DIAGNOSIS — I10 Essential (primary) hypertension: Secondary | ICD-10-CM | POA: Diagnosis not present

## 2019-05-16 DIAGNOSIS — F419 Anxiety disorder, unspecified: Secondary | ICD-10-CM | POA: Diagnosis present

## 2019-05-16 DIAGNOSIS — Z8719 Personal history of other diseases of the digestive system: Secondary | ICD-10-CM

## 2019-05-16 DIAGNOSIS — N200 Calculus of kidney: Secondary | ICD-10-CM | POA: Diagnosis present

## 2019-05-16 DIAGNOSIS — I4819 Other persistent atrial fibrillation: Secondary | ICD-10-CM | POA: Diagnosis not present

## 2019-05-16 DIAGNOSIS — K219 Gastro-esophageal reflux disease without esophagitis: Secondary | ICD-10-CM | POA: Diagnosis present

## 2019-05-16 DIAGNOSIS — K5900 Constipation, unspecified: Secondary | ICD-10-CM | POA: Diagnosis present

## 2019-05-16 DIAGNOSIS — I110 Hypertensive heart disease with heart failure: Secondary | ICD-10-CM | POA: Diagnosis present

## 2019-05-16 DIAGNOSIS — E1122 Type 2 diabetes mellitus with diabetic chronic kidney disease: Secondary | ICD-10-CM | POA: Diagnosis present

## 2019-05-16 DIAGNOSIS — M1990 Unspecified osteoarthritis, unspecified site: Secondary | ICD-10-CM | POA: Diagnosis present

## 2019-05-16 LAB — COMPREHENSIVE METABOLIC PANEL
ALT: 8 U/L (ref 0–44)
AST: 17 U/L (ref 15–41)
Albumin: 4.1 g/dL (ref 3.5–5.0)
Alkaline Phosphatase: 54 U/L (ref 38–126)
Anion gap: 11 (ref 5–15)
BUN: 19 mg/dL (ref 8–23)
CO2: 27 mmol/L (ref 22–32)
Calcium: 9.4 mg/dL (ref 8.9–10.3)
Chloride: 103 mmol/L (ref 98–111)
Creatinine, Ser: 1.19 mg/dL — ABNORMAL HIGH (ref 0.44–1.00)
GFR calc Af Amer: 52 mL/min — ABNORMAL LOW (ref >60)
GFR calc non Af Amer: 45 mL/min — ABNORMAL LOW (ref >60)
Glucose, Bld: 165 mg/dL — ABNORMAL HIGH (ref 70–99)
Potassium: 3.8 mmol/L (ref 3.5–5.1)
Sodium: 141 mmol/L (ref 135–145)
Total Bilirubin: 1.2 mg/dL (ref 0.3–1.2)
Total Protein: 7.1 g/dL (ref 6.5–8.1)

## 2019-05-16 LAB — CBC WITH DIFFERENTIAL/PLATELET
Abs Immature Granulocytes: 0.09 10*3/uL — ABNORMAL HIGH (ref 0.00–0.07)
Basophils Absolute: 0 10*3/uL (ref 0.0–0.1)
Basophils Relative: 0 %
Eosinophils Absolute: 0 10*3/uL (ref 0.0–0.5)
Eosinophils Relative: 0 %
HCT: 35.7 % — ABNORMAL LOW (ref 36.0–46.0)
Hemoglobin: 10.4 g/dL — ABNORMAL LOW (ref 12.0–15.0)
Immature Granulocytes: 1 %
Lymphocytes Relative: 8 %
Lymphs Abs: 0.6 10*3/uL — ABNORMAL LOW (ref 0.7–4.0)
MCH: 27.1 pg (ref 26.0–34.0)
MCHC: 29.1 g/dL — ABNORMAL LOW (ref 30.0–36.0)
MCV: 93 fL (ref 80.0–100.0)
Monocytes Absolute: 0.1 10*3/uL (ref 0.1–1.0)
Monocytes Relative: 1 %
Neutro Abs: 6.9 10*3/uL (ref 1.7–7.7)
Neutrophils Relative %: 90 %
Platelets: 178 10*3/uL (ref 150–400)
RBC: 3.84 MIL/uL — ABNORMAL LOW (ref 3.87–5.11)
RDW: 18.2 % — ABNORMAL HIGH (ref 11.5–15.5)
WBC: 7.8 10*3/uL (ref 4.0–10.5)
nRBC: 0 % (ref 0.0–0.2)

## 2019-05-16 LAB — TROPONIN I (HIGH SENSITIVITY): Troponin I (High Sensitivity): 6 ng/L (ref <18)

## 2019-05-16 LAB — RESPIRATORY PANEL BY RT PCR (FLU A&B, COVID)
Influenza A by PCR: NEGATIVE
Influenza B by PCR: NEGATIVE
SARS Coronavirus 2 by RT PCR: NEGATIVE

## 2019-05-16 LAB — BRAIN NATRIURETIC PEPTIDE: B Natriuretic Peptide: 1338.5 pg/mL — ABNORMAL HIGH (ref 0.0–100.0)

## 2019-05-16 MED ORDER — FUROSEMIDE 10 MG/ML IJ SOLN
40.0000 mg | Freq: Once | INTRAMUSCULAR | Status: AC
  Administered 2019-05-16: 40 mg via INTRAVENOUS
  Filled 2019-05-16: qty 4

## 2019-05-16 MED ORDER — SODIUM CHLORIDE 0.9 % IV SOLN
INTRAVENOUS | Status: DC
  Administered 2019-05-17: 10 mL/h via INTRAVENOUS

## 2019-05-16 NOTE — ED Notes (Signed)
Pt currently in restroom.

## 2019-05-16 NOTE — ED Provider Notes (Signed)
[facility] DEPT Provider Note   CSN: [id number] Arrival date & time: 05/16/19  1605     History Chief Complaint  Patient presents with  . Shortness of Breath  . Cough  . Shoulder Pain    Gabrielle [name] is a 74 y.o. female.  Pt presents to the ED today with sob.  Pt has been getting progressively sob for the last 3 weeks.  He has had a cough with thick, yellow phlegm.  The pt did see her pcp today who told her to come to the ED and get admitted.  Pt has not had any known Covid exposures.  No fevers.  She has not had the Covid vaccine because she is worried about the long term side effects.          Past Medical History:  Diagnosis Date  . Anemia   . Arthritis   . Asthma   . Atrial fibrillation with controlled ventricular response ([place]) 11/2009   a. not on anticoagulation due to recurrent GI bleeding while on Eliquis.  . Chronic bronchitis   . Chronic diastolic CHF (congestive heart failure) ([place])    a. Echo 12/2015: EF 55-60% w/ Grade 3 DD  . Chronic headache   . COPD (chronic obstructive pulmonary disease) ([place])   . Depression   . Heart attack ([place])   . Hyperlipidemia   . Hypertension     Patient Active Problem List   Diagnosis Date Noted  . Pacemaker 04/06/2019  . SIRS (systemic inflammatory response syndrome) ([place]) 02/21/2019  . Acute metabolic encephalopathy 51/88/4166  . Acute respiratory failure ([place]) 02/21/2019  . AKI (acute kidney injury) ([place]) 02/21/2019  . Urinary tract infection without hematuria   . Atrial fibrillation with RVR ([place]) 07/02/2018  . Nausea and vomiting 07/02/2018  . Nausea and/or vomiting 07/01/2018  . Acute lower UTI 07/01/2018  . Compression fracture of T12 vertebra (HCC)   . Hypoxia   . Acute on chronic diastolic CHF (congestive heart failure) /EF 55 % 08/19/2016  . CHF exacerbation ([place]) 08/19/2016  . Chronic diastolic CHF (congestive heart failure) ([place])   . Hypoxemia   . Physical  deconditioning   . Dementia with behavioral disturbance ([place])   . H/O: GI bleed   . Protein-calorie malnutrition, severe 05/15/2016  . Altered mental status   . Acute renal failure (ARF) ([place])   . Encounter for central line care   . Hypokalemia 05/11/2016  . Thrombocytopenia ([place]) 12/25/2015  . Acute congestive heart failure ([place])   . SOB (shortness of breath)   . Dysphagia 12/24/2015  . Tachy-brady syndrome ([place]) 12/24/2015  . Chronic anticoagulation   . Fall   . Prolonged Q-T interval on ECG   . Hypoxic Respiratory failure, acute ([place]) 12/16/2015  . Anemia due to blood loss, acute 10/27/2015  . Elevated lactic acid level 10/27/2015  . Arterial hypotension   . PAF (paroxysmal atrial fibrillation) ([place]) 10/21/2015  . Severe anemia   . Symptomatic anemia   . Coronary artery disease due to lipid rich plaque 02/07/2014  . Obesity 02/07/2014  . Abdominal aortic ectasia ([place]) 09/06/2013  . Encounter for therapeutic drug monitoring 07/13/2013  . Atrial fibrillation ([place]) 12/20/2012  . Atrial fibrillation with controlled ventricular response ([place]) 11/30/2009  . NICOTINE ADDICTION 04/02/2009  . PARAPSORIASIS 12/15/2008  . Hyperlipidemia 12/14/2008  . HEART ATTACK 12/14/2008  . HEADACHE, CHRONIC 12/14/2008  . Depression 12/13/2008  . Hypertensive heart disease with CHF (congestive heart failure) ([place]) 12/13/2008  .  COPD exacerbation (HCC) 12/13/2008  . ARTHRITIS 12/13/2008    Past Surgical History:  Procedure Laterality Date  . CAD-CABG     x7, brief post-op Atrial Fib  . CESAREAN SECTION     x2  . COLONOSCOPY WITH PROPOFOL N/A 10/24/2015   Procedure: COLONOSCOPY WITH PROPOFOL;  Surgeon: Carman Ching, MD;  Location: [facility] ENDOSCOPY;  Service: Endoscopy;  Laterality: N/A;  . CORONARY ARTERY BYPASS GRAFT  2011  . EP IMPLANTABLE DEVICE N/A 12/26/2015   Procedure: Pacemaker Implant;  Surgeon: Marinus Maw, MD;  Location: [facility] INVASIVE CV LAB;  Service: Cardiovascular;  Laterality:  N/A;  . ESOPHAGOGASTRODUODENOSCOPY N/A 10/22/2015   Procedure: ESOPHAGOGASTRODUODENOSCOPY (EGD);  Surgeon: Carman Ching, MD;  Location: [facility] ENDOSCOPY;  Service: Endoscopy;  Laterality: N/A;  . ESOPHAGOGASTRODUODENOSCOPY N/A 12/24/2015   Procedure: ESOPHAGOGASTRODUODENOSCOPY (EGD);  Surgeon: Carman Ching, MD;  Location: [facility] ENDOSCOPY;  Service: Endoscopy;  Laterality: N/A;  . JOINT REPLACEMENT Bilateral   . TONSILLECTOMY    . TOTAL KNEE ARTHROPLASTY       OB History   No obstetric history on file.     Family History  Problem Relation Age of Onset  . Diabetes Mother   . Hypertension Mother   . Heart disease Mother        before age 10  . Heart disease Father        before age 26  . Hypertension Father   . Heart attack Father   . Lung cancer Maternal Grandfather   . Lung cancer Paternal Grandfather   . Hypertension Daughter   . Stroke Paternal Grandmother     Social History   Tobacco Use  . Smoking status: Current Some Day Smoker    Packs/day: 0.20    Types: Cigarettes  . Smokeless tobacco: Never Used  . Tobacco comment: "Occasionally I will smoke a cigarette"   Substance Use Topics  . Alcohol use: No    Alcohol/week: 0.0 standard drinks  . Drug use: No    [name] Medications Prior to Admission medications   Medication Sig Start Date End Date Taking? Authorizing Provider  Acetaminophen (TYLENOL 8 HOUR ARTHRITIS PAIN PO) Take 2 tablets by mouth 2 (two) times daily. Back pain   Yes [provider]  albuterol (PROVENTIL HFA;VENTOLIN HFA) 108 (90 Base) MCG/ACT inhaler Inhale 2 puffs into the lungs every 6 (six) hours as needed for wheezing or shortness of breath.   Yes [provider]  ALPRAZolam Prudy Feeler) 1 MG tablet Take 1 tablet by mouth at bedtime.  04/05/19  Yes [provider]  atorvastatin (LIPITOR) 20 MG tablet Take 1 tablet (20 mg total) by mouth daily at 6 PM. 02/02/19  Yes Skains, Veverly Fells, MD  budesonide-formoterol (SYMBICORT) 160-4.5 MCG/ACT  inhaler Inhale 2 puffs into the lungs 2 (two) times daily. 01/15/14  Yes Storm Frisk, MD  citalopram (CELEXA) 20 MG tablet Take 40 mg by mouth daily.  07/08/16  Yes [provider]  clobetasol cream (TEMOVATE) 0.05 % Apply 1 application topically 2 (two) times daily. Affected skin 05/21/16  Yes Vassie Loll, MD  COSENTYX SENSOREADY, 300 MG, 150 MG/ML SOAJ Inject 2 Syringes into the skin every 28 (twenty-eight) days. 01/31/19  Yes [provider]  diclofenac sodium (VOLTAREN) 1 % GEL Apply 4 g topically 4 (four) times daily. 07/26/18  Yes Melene Plan, DO  diltiazem (TIAZAC) 120 MG 24 hr capsule Take 1 capsule (120 mg total) by mouth daily. 02/02/19  Yes Jake Bathe, MD  docusate  sodium (COLACE) 100 MG capsule Take 100 mg by mouth 2 (two) times daily as needed for moderate constipation.    Yes [provider]  fluticasone (FLONASE) 50 MCG/ACT nasal spray Place 2 sprays into both nostrils daily. 12/21/15  Yes Albertine Grates, MD  furosemide (LASIX) 20 MG tablet Take 1 tablet (20 mg total) by mouth daily. Patient taking differently: Take 40 mg by mouth daily.  02/02/19  Yes Jake Bathe, MD  Melatonin 10 MG TABS Take 10 mg by mouth at bedtime.   Yes [provider]  metoprolol tartrate (LOPRESSOR) 50 MG tablet Take 1.5 tablets (75 mg total) by mouth 2 (two) times daily. 04/12/19  Yes Jake Bathe, MD  pantoprazole (PROTONIX) 40 MG tablet Take 1 tablet (40 mg total) by mouth daily. 02/02/19  Yes Jake Bathe, MD  potassium chloride SA (KLOR-CON) 20 MEQ tablet Take 1 tablet (20 mEq total) by mouth daily. Patient taking differently: Take 40 mEq by mouth daily.  02/02/19  Yes Jake Bathe, MD  sucralfate (CARAFATE) 1 g tablet Take 1 g by mouth 3 (three) times daily before meals.   Yes [provider]  tiotropium (SPIRIVA) 18 MCG inhalation capsule Place 18 mcg into inhaler and inhale daily.   Yes [provider]  tiZANidine (ZANAFLEX) 4 MG tablet Take 4  mg by mouth 2 (two) times daily as needed for muscle spasms.  03/16/19  Yes [provider]  guaiFENesin-dextromethorphan (ROBITUSSIN DM) 100-10 MG/5ML syrup Take 5 mLs by mouth every 4 (four) hours as needed for cough (chest congestion). Patient not taking: Reported on 05/16/2019 03/01/19   Leatha Gilding, MD    Allergies    Novocain [procaine] and Penicillins  Review of Systems   Review of Systems  Respiratory: Positive for cough and shortness of breath.   All other systems reviewed and are negative.   Physical Exam Updated Vital Signs BP (!) 153/97 (BP Location: Left Arm)   Pulse 92   Temp 97.9 F (36.6 C) (Oral)   Resp 18   LMP  (LMP Unknown)   SpO2 93%   Physical Exam Vitals and nursing note reviewed.  Constitutional:      General: She is in acute distress.     Appearance: She is well-developed. She is obese.  HENT:     Head: Normocephalic and atraumatic.     Mouth/Throat:     Mouth: Mucous membranes are moist.     Pharynx: Oropharynx is clear.  Eyes:     Extraocular Movements: Extraocular movements intact.     Pupils: Pupils are equal, round, and reactive to light.  Cardiovascular:     Rate and Rhythm: Normal rate. Rhythm irregular.  Pulmonary:     Effort: Tachypnea present.  Abdominal:     General: Bowel sounds are normal.     Palpations: Abdomen is soft.  Musculoskeletal:        General: Normal range of motion.     Cervical back: Normal range of motion and neck supple.     Right lower leg: Edema present.     Left lower leg: Edema present.  Skin:    General: Skin is warm.     Capillary Refill: Capillary refill takes less than 2 seconds.  Neurological:     General: No focal deficit present.     Mental Status: She is alert and oriented to person, place, and time.  Psychiatric:        Mood and Affect: Mood  normal.        Behavior: Behavior normal.     ED Results / Procedures / Treatments   Labs (all labs ordered are listed, but only  abnormal results are displayed) Labs Reviewed  CBC WITH DIFFERENTIAL/PLATELET - Abnormal; Notable for the following components:      Result Value   RBC 3.84 (*)    Hemoglobin 10.4 (*)    HCT 35.7 (*)    MCHC 29.1 (*)    RDW 18.2 (*)    Lymphs Abs 0.6 (*)    Abs Immature Granulocytes 0.09 (*)    All other components within normal limits  COMPREHENSIVE METABOLIC PANEL - Abnormal; Notable for the following components:   Glucose, Bld 165 (*)    Creatinine, Ser 1.19 (*)    GFR calc non Af Amer 45 (*)    GFR calc Af Amer 52 (*)    All other components within normal limits  BRAIN NATRIURETIC PEPTIDE - Abnormal; Notable for the following components:   B Natriuretic Peptide 1,338.5 (*)    All other components within normal limits  RESPIRATORY PANEL BY RT PCR (FLU A&B, COVID)  TROPONIN I (HIGH SENSITIVITY)  TROPONIN I (HIGH SENSITIVITY)    EKG EKG Interpretation  Date/Time:  Tuesday May 16 2019 16:24:37 EDT Ventricular Rate:  97 PR Interval:    QRS Duration: 168 QT Interval:  372 QTC Calculation: 473 R Axis:   -46 Text Interpretation: Atrial fibrillation Ventricular premature complex Nonspecific IVCD with LAD Inferior infarct, old Anterior infarct, old 90 Lead; [place] No significant change since last tracing Confirmed by Jacalyn Lefevre [phone]) on 05/16/2019 7:42:01 PM   Radiology DG Chest 2 View  Result Date: 05/16/2019 CLINICAL DATA:  Dyspnea. EXAM: CHEST - 2 VIEW COMPARISON:  April 05, 2019 FINDINGS: Stable mild cardiomegaly, dual lead cardiac pacemaker placement with left-sided battery pack, intact median sternotomy wires, mediastinal surgical clips and CABG markers. Redemonstration of mild pulmonary edema with trace left pleural effusion. Minimal linear atelectasis in the right lateral costophrenic angle. No pneumothorax or pneumonia. Streaky infrahilar consolidation, probably atelectatic or nonspecific bronchitis. Aortic knob calcified atherosclerosis and moderate  skeletal degenerative changes. IMPRESSION: Stable mild cardiomegaly and thoracic postoperative changes with mild pulmonary edema, trace left pleural effusion and bilateral infrahilar streaky consolidation that is probably atelectatic or a nonspecific bronchitis. Aortic calcified atherosclerosis. Electronically Signed   By: Laurence Ferrari   On: 05/16/2019 17:57    Procedures Procedures (including critical care time)  Medications Ordered in ED Medications  0.9 %  sodium chloride infusion (has no administration in time range)  furosemide (LASIX) injection 40 mg (40 mg Intravenous Given 05/16/19 2005)    ED Course  I have reviewed the triage vital signs and the nursing notes.  Pertinent labs & imaging results that were available during my care of the patient were reviewed by me and considered in my medical decision making (see chart for details).    MDM Rules/Calculators/A&P                      Pt ambulates with O2 sats down to 86% on when she walks.  The pt was placed on 2L.  Pt given IV lasix.  Pt is d/w Dr. Mikeal Hawthorne (triad) for admission.  Covid pending at time of admission.   CHA2DS2/VAS Stroke Risk Points  Current as of just now     7 >= 2 Points: High Risk  1 - 1.99 Points: Medium Risk  0 Points:  Low Risk    The patient's score has not changed in the past year.: No Change     Details    This score determines the patient's risk of having a stroke if the  patient has atrial fibrillation.       Points Metrics  1 Has Congestive Heart Failure:  Yes    Current as of just now  1 Has Vascular Disease:  Yes    Current as of just now  1 Has Hypertension:  Yes    Current as of just now  2 Age:  [age]    Current as of just now  1 Has Diabetes:  Yes    Current as of just now  0 Had Stroke:  No  Had TIA:  No  Had thromboembolism:  No    Current as of just now  1 Female:  Yes    Current as of just now   CRITICAL CARE Performed by: Jacalyn LefevreJulie Trigger Frasier   Total critical care time: 30  minutes  Critical care time was exclusive of separately billable procedures and treating other patients.  Critical care was necessary to treat or prevent imminent or life-threatening deterioration.  Critical care was time spent personally by me on the following activities: development of treatment plan with patient and/or surrogate as well as nursing, discussions with consultants, evaluation of patient's response to treatment, examination of patient, obtaining history from patient or surrogate, ordering and performing treatments and interventions, ordering and review of laboratory studies, ordering and review of radiographic studies, pulse oximetry and re-evaluation of patient's condition.  Final Clinical Impression(s) / ED Diagnoses Final diagnoses:  Acute on chronic congestive heart failure, unspecified heart failure type (HCC)  Acute respiratory failure with hypoxia [facility](HCC)    Rx / DC Orders ED Discharge Orders    None       Jacalyn LefevreHaviland, Thandiwe Siragusa, MD 05/16/19 2135

## 2019-05-16 NOTE — H&P (Signed)
History and Physical   Gabrielle [name]:[id number] DOB: 1946/01/18 DOA: 05/16/2019  Referring MD/NP/PA: Dr. Particia Nearing  PCP: Gabrielle Screws, MD   Outpatient Specialists: None  Patient coming from: Home  Chief Complaint: Shortness of breath  HPI: Gabrielle [name] is a 74 y.o. female with medical history significant of asthma, anemia, atrial fibrillation, chronic diastolic CHF, COPD, coronary artery disease, hypertension and hyperlipidemia who was sent over by PCP secondary to progressive shortness of breath and cough for the last 3 weeks.  Patient has had sputum production.  She also has had orthopnea and PND.  She had an echo back in December showing EF of 55 to 60%.  Patient has noted progressive shortness of breath with exertion and now even at rest.  Denies any fever or chills denied any nausea vomiting or diarrhea.  Patient seen and evaluated and appears to be having acute exacerbation of her CHF.  She is being admitted to the hospital for work-up and treatment..  ED Course: Temperature 97.9 blood pressure 153/97, pulse 111, respiratory rate of 18, oxygen sats 93% on room air.  BNP is 1338, hemoglobin 10.4 otherwise CBC within normal.  Glucose is 165 otherwise chemistry also within normal.  COVID-19 screen is negative.  Chest x-ray shows stable cardiomegaly antibiotics postoperative changes.  Mild pulmonary edema trace left pleural effusion bilateral infrahilar consolidation.  Patient being admitted with acute exacerbation of diastolic CHF.  Review of Systems: As per HPI otherwise 10 point review of systems negative.    Past Medical History:  Diagnosis Date  . Anemia   . Arthritis   . Asthma   . Atrial fibrillation with controlled ventricular response (HCC) 11/2009   a. not on anticoagulation due to recurrent GI bleeding while on Eliquis.  . Chronic bronchitis   . Chronic diastolic CHF (congestive heart failure) (HCC)    a. Echo 12/2015: EF 55-60% w/ Grade 3 DD  . Chronic  headache   . COPD (chronic obstructive pulmonary disease) (HCC)   . Depression   . Heart attack (HCC)   . Hyperlipidemia   . Hypertension     Past Surgical History:  Procedure Laterality Date  . CAD-CABG     x7, brief post-op Atrial Fib  . CESAREAN SECTION     x2  . COLONOSCOPY WITH PROPOFOL N/A 10/24/2015   Procedure: COLONOSCOPY WITH PROPOFOL;  Surgeon: Carman Ching, MD;  Location[facility] ENDOSCOPY;  Service: Endoscopy;  Laterality: N/A;  . CORONARY ARTERY BYPASS GRAFT  2011  . EP IMPLANTABLE DEVICE N/A 12/26/2015   Procedure: Pacemaker Implant;  Surgeon: Marinus Maw, MD;  Location: [facility] INVASIVE CV LAB;  Service: Cardiovascular;  Laterality: N/A;  . ESOPHAGOGASTRODUODENOSCOPY N/A 10/22/2015   Procedure: ESOPHAGOGASTRODUODENOSCOPY (EGD);  Surgeon: Carman Ching, MD;  Location: [facility] ENDOSCOPY;  Service: Endoscopy;  Laterality: N/A;  . ESOPHAGOGASTRODUODENOSCOPY N/A 12/24/2015   Procedure: ESOPHAGOGASTRODUODENOSCOPY (EGD);  Surgeon: Carman Ching, MD;  Location: [facility] ENDOSCOPY;  Service: Endoscopy;  Laterality: N/A;  . JOINT REPLACEMENT Bilateral   . TONSILLECTOMY    . TOTAL KNEE ARTHROPLASTY       reports that she has been smoking cigarettes. She has been smoking about 0.20 packs per day. She has never used smokeless tobacco. She reports that she does not drink alcohol or use drugs.  Allergies  Allergen Reactions  . Novocain [Procaine] Hives and Palpitations  . Penicillins Hives and Other (See Comments)    Has patient had a PCN reaction causing immediate rash, facial/tongue/throat swelling, SOB or lightheadedness  with hypotension: No Has patient had a PCN reaction causing severe rash involving mucus membranes or skin necrosis: No Has patient had a PCN reaction that required hospitalization No Has patient had a PCN reaction occurring within the last 10 years: No If all of the above answers are "NO", then may proceed with Cephalosporin use.    Family History  Problem Relation Age of  Onset  . Diabetes Mother   . Hypertension Mother   . Heart disease Mother        before age 76  . Heart disease Father        before age 76  . Hypertension Father   . Heart attack Father   . Lung cancer Maternal Grandfather   . Lung cancer Paternal Grandfather   . Hypertension Daughter   . Stroke Paternal Grandmother      Prior to Admission medications   Medication Sig Start Date End Date Taking? Authorizing Provider  Acetaminophen (TYLENOL 8 HOUR ARTHRITIS PAIN PO) Take 2 tablets by mouth 2 (two) times daily. Back pain   Yes [provider]  albuterol (PROVENTIL HFA;VENTOLIN HFA) 108 (90 Base) MCG/ACT inhaler Inhale 2 puffs into the lungs every 6 (six) hours as needed for wheezing or shortness of breath.   Yes [provider]  ALPRAZolam Prudy Feeler(XANAX) 1 MG tablet Take 1 tablet by mouth at bedtime.  04/05/19  Yes [provider]  atorvastatin (LIPITOR) 20 MG tablet Take 1 tablet (20 mg total) by mouth daily at 6 PM. 02/02/19  Yes Skains, Veverly FellsMark C, MD  budesonide-formoterol (SYMBICORT) 160-4.5 MCG/ACT inhaler Inhale 2 puffs into the lungs 2 (two) times daily. 01/15/14  Yes Storm FriskWright, Patrick E, MD  citalopram (CELEXA) 20 MG tablet Take 40 mg by mouth daily.  07/08/16  Yes [provider]  clobetasol cream (TEMOVATE) 0.05 % Apply 1 application topically 2 (two) times daily. Affected skin 05/21/16  Yes Vassie LollMadera, Carlos, MD  COSENTYX SENSOREADY, 300 MG, 150 MG/ML SOAJ Inject 2 Syringes into the skin every 28 (twenty-eight) days. 01/31/19  Yes [provider]  diclofenac sodium (VOLTAREN) 1 % GEL Apply 4 g topically 4 (four) times daily. 07/26/18  Yes Melene PlanFloyd, Dan, DO  diltiazem (TIAZAC) 120 MG 24 hr capsule Take 1 capsule (120 mg total) by mouth daily. 02/02/19  Yes Jake BatheSkains, Mark C, MD  docusate sodium (COLACE) 100 MG capsule Take 100 mg by mouth 2 (two) times daily as needed for moderate constipation.    Yes [provider]  fluticasone (FLONASE) 50 MCG/ACT  nasal spray Place 2 sprays into both nostrils daily. 12/21/15  Yes Albertine GratesXu, Fang, MD  furosemide (LASIX) 20 MG tablet Take 1 tablet (20 mg total) by mouth daily. Patient taking differently: Take 40 mg by mouth daily.  02/02/19  Yes Jake BatheSkains, Mark C, MD  Melatonin 10 MG TABS Take 10 mg by mouth at bedtime.   Yes [provider]  metoprolol tartrate (LOPRESSOR) 50 MG tablet Take 1.5 tablets (75 mg total) by mouth 2 (two) times daily. 04/12/19  Yes Jake BatheSkains, Mark C, MD  pantoprazole (PROTONIX) 40 MG tablet Take 1 tablet (40 mg total) by mouth daily. 02/02/19  Yes Jake BatheSkains, Mark C, MD  potassium chloride SA (KLOR-CON) 20 MEQ tablet Take 1 tablet (20 mEq total) by mouth daily. Patient taking differently: Take 40 mEq by mouth daily.  02/02/19  Yes Jake BatheSkains, Mark C, MD  sucralfate (CARAFATE) 1 g tablet Take 1 g by mouth 3 (three) times daily before meals.  Yes [provider]  tiotropium (SPIRIVA) 18 MCG inhalation capsule Place 18 mcg into inhaler and inhale daily.   Yes [provider]  tiZANidine (ZANAFLEX) 4 MG tablet Take 4 mg by mouth 2 (two) times daily as needed for muscle spasms.  03/16/19  Yes [provider]  guaiFENesin-dextromethorphan (ROBITUSSIN DM) 100-10 MG/5ML syrup Take 5 mLs by mouth every 4 (four) hours as needed for cough (chest congestion). Patient not taking: Reported on 05/16/2019 03/01/19   Caren Griffins, MD    Physical Exam: Vitals:   05/16/19 1614 05/16/19 2113 05/17/19 0023  BP: (!) 153/97 117/84 111/79  Pulse: 92 (!) 111   Resp: 18 17   Temp: 97.9 F (36.6 C)    TempSrc: Oral    SpO2: 93% 93%       Constitutional: Morbidly obese, in mild distress Vitals:   05/16/19 1614 05/16/19 2113 05/17/19 0023  BP: (!) 153/97 117/84 111/79  Pulse: 92 (!) 111   Resp: 18 17   Temp: 97.9 F (36.6 C)    TempSrc: Oral    SpO2: 93% 93%    Eyes: PERRL, lids and conjunctivae normal ENMT: Mucous membranes are moist. Posterior pharynx clear of any  exudate or lesions.Normal dentition.  Neck: normal, supple, no masses, no thyromegaly Respiratory: Decreased air entry at the bases with some crackles normal respiratory effort. No accessory muscle use.  Cardiovascular: Irregularly irregular rhythm with mild tachycardia, no murmurs / rubs / gallops. No extremity edema. 2+ pedal pulses. No carotid bruits.  Abdomen: no tenderness, no masses palpated. No hepatosplenomegaly. Bowel sounds positive.  Musculoskeletal: no clubbing / cyanosis. No joint deformity upper and lower extremities. Good ROM, no contractures. Normal muscle tone.  Skin: no rashes, lesions, ulcers. No induration Neurologic: CN 2-12 grossly intact. Sensation intact, DTR normal. Strength 5/5 in all 4.  Psychiatric: Normal judgment and insight. Alert and oriented x 3. Normal mood.     Labs on Admission: I have personally reviewed following labs and imaging studies  CBC: Recent Labs  Lab 05/16/19 1840  WBC 7.8  NEUTROABS 6.9  HGB 10.4*  HCT 35.7*  MCV 93.0  PLT 161   Basic Metabolic Panel: Recent Labs  Lab 05/16/19 1840  NA 141  K 3.8  CL 103  CO2 27  GLUCOSE 165*  BUN 19  CREATININE 1.19*  CALCIUM 9.4   GFR: CrCl cannot be calculated (Unknown ideal weight.). Liver Function Tests: Recent Labs  Lab 05/16/19 1840  AST 17  ALT 8  ALKPHOS 54  BILITOT 1.2  PROT 7.1  ALBUMIN 4.1   No results for input(s): LIPASE, AMYLASE in the last 168 hours. No results for input(s): AMMONIA in the last 168 hours. Coagulation Profile: No results for input(s): INR, PROTIME in the last 168 hours. Cardiac Enzymes: No results for input(s): CKTOTAL, CKMB, CKMBINDEX, TROPONINI in the last 168 hours. BNP (last 3 results) No results for input(s): PROBNP in the last 8760 hours. HbA1C: No results for input(s): HGBA1C in the last 72 hours. CBG: No results for input(s): GLUCAP in the last 168 hours. Lipid Profile: No results for input(s): CHOL, HDL, LDLCALC, TRIG, CHOLHDL,  LDLDIRECT in the last 72 hours. Thyroid Function Tests: No results for input(s): TSH, T4TOTAL, FREET4, T3FREE, THYROIDAB in the last 72 hours. Anemia Panel: No results for input(s): VITAMINB12, FOLATE, FERRITIN, TIBC, IRON, RETICCTPCT in the last 72 hours. Urine analysis:    Component Value Date/Time   COLORURINE YELLOW 02/21/2019 [place]  02/21/2019 1810   LABSPEC 1.018 02/21/2019 1810   PHURINE 6.0 02/21/2019 1810   GLUCOSEU NEGATIVE 02/21/2019 1810   HGBUR NEGATIVE 02/21/2019 1810   BILIRUBINUR NEGATIVE 02/21/2019 1810   KETONESUR NEGATIVE 02/21/2019 1810   PROTEINUR 30 (A) 02/21/2019 1810   UROBILINOGEN 1.0 12/02/2010 1023   NITRITE NEGATIVE 02/21/2019 1810   LEUKOCYTESUR NEGATIVE 02/21/2019 1810   Sepsis Labs: @LABRCNTIP (procalcitonin:4,lacticidven:4) ) Recent Results (from the past 240 hour(s))  Respiratory Panel by RT PCR (Flu A&B, Covid) - Nasopharyngeal Swab     Status: None   Collection Time: 05/16/19  8:05 PM   Specimen: Nasopharyngeal Swab  Result Value Ref Range Status   SARS Coronavirus 2 by RT PCR NEGATIVE NEGATIVE Final    Comment: (NOTE) SARS-CoV-2 target nucleic acids are NOT DETECTED. The SARS-CoV-2 RNA is generally detectable in upper respiratoy specimens during the acute phase of infection. The lowest concentration of SARS-CoV-2 viral copies this assay can detect is 131 copies/mL. A negative result does not preclude SARS-Cov-2 infection and should not be used as the sole basis for treatment or other patient management decisions. A negative result may occur with  improper specimen collection/handling, submission of specimen other than nasopharyngeal swab, presence of viral mutation(s) within the areas targeted by this assay, and inadequate number of viral copies (<131 copies/mL). A negative result must be combined with clinical observations, patient history, and epidemiological information. The expected result is Negative. Fact Sheet  for Patients:  05/18/19 Fact Sheet for Healthcare Providers:  https://www.moore.com/ This test is not yet ap proved or cleared by the https://www.young.biz/ FDA and  has been authorized for detection and/or diagnosis of SARS-CoV-2 by FDA under an Emergency Use Authorization (EUA). This EUA will remain  in effect (meaning this test can be used) for the duration of the COVID-19 declaration under Section 564(b)(1) of the Act, 21 U.S.C. section 360bbb-3(b)(1), unless the authorization is terminated or revoked sooner.    Influenza A by PCR NEGATIVE NEGATIVE Final   Influenza B by PCR NEGATIVE NEGATIVE Final    Comment: (NOTE) The Xpert Xpress SARS-CoV-2/FLU/RSV assay is intended as an aid in  the diagnosis of influenza from Nasopharyngeal swab specimens and  should not be used as a sole basis for treatment. Nasal washings and  aspirates are unacceptable for Xpert Xpress SARS-CoV-2/FLU/RSV  testing. Fact Sheet for Patients: [place] Fact Sheet for Healthcare Providers: https://www.moore.com/ This test is not yet approved or cleared by the https://www.young.biz/ FDA and  has been authorized for detection and/or diagnosis of SARS-CoV-2 by  FDA under an Emergency Use Authorization (EUA). This EUA will remain  in effect (meaning this test can be used) for the duration of the  Covid-19 declaration under Section 564(b)(1) of the Act, 21  U.S.C. section 360bbb-3(b)(1), unless the authorization is  terminated or revoked. Performed at [facility], [address] [place], [place]      Radiological Exams on Admission: DG Chest 2 View  Result Date: 05/16/2019 CLINICAL DATA:  Dyspnea. EXAM: CHEST - 2 VIEW COMPARISON:  April 05, 2019 FINDINGS: Stable mild cardiomegaly, dual lead cardiac pacemaker placement with left-sided battery pack, intact median sternotomy wires, mediastinal  surgical clips and CABG markers. Redemonstration of mild pulmonary edema with trace left pleural effusion. Minimal linear atelectasis in the right lateral costophrenic angle. No pneumothorax or pneumonia. Streaky infrahilar consolidation, probably atelectatic or nonspecific bronchitis. Aortic knob calcified atherosclerosis and moderate skeletal degenerative changes. IMPRESSION: Stable mild cardiomegaly and thoracic postoperative changes with  mild pulmonary edema, trace left pleural effusion and bilateral infrahilar streaky consolidation that is probably atelectatic or a nonspecific bronchitis. Aortic calcified atherosclerosis. Electronically Signed   By: Laurence Ferrari   On: 05/16/2019 17:57    EKG: Independently reviewed.  It shows atrial fibrillation with a rate of 97, in the density of intravascular conduction delay.  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  IV Lasix.  Continue with home regimen.  She has had echo less than 3 months ago so no repeat.  Once patient is stabilized discharged home on stable dose of diuretics.  #2 hyperlipidemia: Continue statins  #3 atrial fibrillation: Rate is controlled.  Resume home regimen  #4 hypertension: Resume home regimen.  #5 coronary artery disease: Stable.  Keep on telemetry and monitor  #6 morbid obesity: Dietary counseling.   DVT prophylaxis: Lovenox  Code Status: Full Family Communication: No family at bedside Disposition Plan: Home Consults called: None Admission status: Inpatient  Severity of Illness: The  appropriate patient status for this patient is INPATIENT. Inpatient status is judged to be reasonable and necessary in order to provide the required intensity of service to ensure the patient's safety. The patient's presenting symptoms, physical exam findings, and initial radiographic and laboratory data in the context of their chronic comorbidities is felt to place them at high risk for further clinical deterioration. Furthermore, it is not anticipated that the patient will be medically stable for discharge from the hospital within 2 midnights of admission. The following factors support the patient status of inpatient.   " The patient's presenting symptoms include shortness of breath. " The worrisome physical exam findings include bilateral crackles. " The initial radiographic and laboratory data are worrisome because of pulmonary edema and pleural effusion. " The chronic co-morbidities include diastolic CHF.   * I certify that at the point of admission it is my clinical judgment that the patient will require inpatient hospital care spanning beyond 2 midnights from the point of admission due to high intensity of service, high risk for further deterioration and high frequency of surveillance required.Lonia Blood MD [facility] Pager [phone]  If 7PM-7AM, please contact night-coverage [email or web address] Password [facility]  05/17/2019, 12:38 AM

## 2019-05-16 NOTE — ED Triage Notes (Signed)
Pt reports that she has been having exertional SOB for 3 weeks with a cough that is sometimes productive with thick, yellow phlegm. Also c/o right shoulder pains. Denies any falls. Reports there PCP sent here for further evaluation and to be admitted.

## 2019-05-17 ENCOUNTER — Encounter (HOSPITAL_COMMUNITY): Payer: Self-pay | Admitting: Internal Medicine

## 2019-05-17 LAB — BASIC METABOLIC PANEL
Anion gap: 12 (ref 5–15)
BUN: 17 mg/dL (ref 8–23)
CO2: 29 mmol/L (ref 22–32)
Calcium: 9.4 mg/dL (ref 8.9–10.3)
Chloride: 101 mmol/L (ref 98–111)
Creatinine, Ser: 1.2 mg/dL — ABNORMAL HIGH (ref 0.44–1.00)
GFR calc Af Amer: 51 mL/min — ABNORMAL LOW (ref >60)
GFR calc non Af Amer: 44 mL/min — ABNORMAL LOW (ref >60)
Glucose, Bld: 180 mg/dL — ABNORMAL HIGH (ref 70–99)
Potassium: 3.1 mmol/L — ABNORMAL LOW (ref 3.5–5.1)
Sodium: 142 mmol/L (ref 135–145)

## 2019-05-17 LAB — MAGNESIUM: Magnesium: 2 mg/dL (ref 1.7–2.4)

## 2019-05-17 LAB — TROPONIN I (HIGH SENSITIVITY): Troponin I (High Sensitivity): 9 ng/L (ref <18)

## 2019-05-17 MED ORDER — CITALOPRAM HYDROBROMIDE 10 MG PO TABS: 40.0000 mg | ORAL_TABLET | Freq: Every day | ORAL | Status: DC

## 2019-05-17 MED ORDER — MOMETASONE FURO-FORMOTEROL FUM 200-5 MCG/ACT IN AERO
2.0000 | INHALATION_SPRAY | Freq: Two times a day (BID) | RESPIRATORY_TRACT | Status: DC
  Administered 2019-05-17 – 2019-05-26 (×18): 2 via RESPIRATORY_TRACT
  Filled 2019-05-17: qty 8.8

## 2019-05-17 MED ORDER — ACETAMINOPHEN 325 MG PO TABS
650.0000 mg | ORAL_TABLET | ORAL | Status: DC | PRN
  Administered 2019-05-17 – 2019-05-21 (×4): 650 mg via ORAL
  Filled 2019-05-17 (×4): qty 2

## 2019-05-17 MED ORDER — CLOBETASOL PROPIONATE 0.05 % EX CREA
1.0000 "application " | TOPICAL_CREAM | Freq: Two times a day (BID) | CUTANEOUS | Status: DC
  Administered 2019-05-17 – 2019-05-26 (×18): 1 via TOPICAL
  Filled 2019-05-17 (×3): qty 15

## 2019-05-17 MED ORDER — SODIUM CHLORIDE 0.9% FLUSH
3.0000 mL | Freq: Two times a day (BID) | INTRAVENOUS | Status: DC
  Administered 2019-05-17 – 2019-05-22 (×12): 3 mL via INTRAVENOUS

## 2019-05-17 MED ORDER — UMECLIDINIUM BROMIDE 62.5 MCG/INH IN AEPB
1.0000 | INHALATION_SPRAY | Freq: Every day | RESPIRATORY_TRACT | Status: DC
  Administered 2019-05-17 – 2019-05-26 (×8): 1 via RESPIRATORY_TRACT
  Filled 2019-05-17 (×2): qty 7

## 2019-05-17 MED ORDER — GUAIFENESIN-DM 100-10 MG/5ML PO SYRP
5.0000 mL | ORAL_SOLUTION | ORAL | Status: DC | PRN
  Administered 2019-05-17 – 2019-05-26 (×6): 5 mL via ORAL
  Filled 2019-05-17 (×6): qty 10

## 2019-05-17 MED ORDER — DOCUSATE SODIUM 100 MG PO CAPS: 100.0000 mg | ORAL_CAPSULE | Freq: Two times a day (BID) | ORAL | Status: DC | PRN

## 2019-05-17 MED ORDER — ACETAMINOPHEN 325 MG PO TABS
ORAL_TABLET | ORAL | Status: AC
  Administered 2019-05-17: 650 mg via ORAL
  Filled 2019-05-17: qty 2

## 2019-05-17 MED ORDER — TIOTROPIUM BROMIDE MONOHYDRATE 18 MCG IN CAPS: 18.0000 ug | ORAL_CAPSULE | Freq: Every day | RESPIRATORY_TRACT | Status: DC

## 2019-05-17 MED ORDER — ENOXAPARIN SODIUM 40 MG/0.4ML ~~LOC~~ SOLN
40.0000 mg | SUBCUTANEOUS | Status: DC
  Administered 2019-05-17 – 2019-05-21 (×5): 40 mg via SUBCUTANEOUS
  Filled 2019-05-17 (×5): qty 0.4

## 2019-05-17 MED ORDER — ALPRAZOLAM 0.5 MG PO TABS
1.0000 mg | ORAL_TABLET | Freq: Every day | ORAL | Status: DC
  Administered 2019-05-17 – 2019-05-21 (×6): 1 mg via ORAL
  Filled 2019-05-17 (×6): qty 2

## 2019-05-17 MED ORDER — POTASSIUM CHLORIDE CRYS ER 20 MEQ PO TBCR
40.0000 meq | EXTENDED_RELEASE_TABLET | Freq: Every day | ORAL | Status: DC
  Administered 2019-05-17 – 2019-05-22 (×6): 40 meq via ORAL
  Filled 2019-05-17 (×6): qty 2

## 2019-05-17 MED ORDER — SODIUM CHLORIDE 0.9% FLUSH: 3.0000 mL | INTRAVENOUS | Status: DC | PRN

## 2019-05-17 MED ORDER — MELATONIN 5 MG PO TABS
10.0000 mg | ORAL_TABLET | Freq: Every day | ORAL | Status: DC
  Administered 2019-05-17 – 2019-05-25 (×10): 10 mg via ORAL
  Filled 2019-05-17 (×10): qty 2

## 2019-05-17 MED ORDER — TIZANIDINE HCL 4 MG PO TABS
4.0000 mg | ORAL_TABLET | Freq: Two times a day (BID) | ORAL | Status: DC | PRN
  Administered 2019-05-17 – 2019-05-19 (×3): 4 mg via ORAL
  Filled 2019-05-17 (×3): qty 1

## 2019-05-17 MED ORDER — PANTOPRAZOLE SODIUM 40 MG PO TBEC
40.0000 mg | DELAYED_RELEASE_TABLET | Freq: Every day | ORAL | Status: DC
  Administered 2019-05-17 – 2019-05-26 (×10): 40 mg via ORAL
  Filled 2019-05-17 (×10): qty 1

## 2019-05-17 MED ORDER — CITALOPRAM HYDROBROMIDE 20 MG PO TABS
20.0000 mg | ORAL_TABLET | Freq: Every day | ORAL | Status: DC
  Administered 2019-05-17 – 2019-05-26 (×10): 20 mg via ORAL
  Filled 2019-05-17 (×10): qty 1

## 2019-05-17 MED ORDER — SUCRALFATE 1 G PO TABS
1.0000 g | ORAL_TABLET | Freq: Three times a day (TID) | ORAL | Status: DC
  Administered 2019-05-17 – 2019-05-26 (×18): 1 g via ORAL
  Filled 2019-05-17 (×21): qty 1

## 2019-05-17 MED ORDER — SODIUM CHLORIDE 0.9 % IV SOLN: 250.0000 mL | INTRAVENOUS | Status: DC | PRN

## 2019-05-17 MED ORDER — ALBUTEROL SULFATE (2.5 MG/3ML) 0.083% IN NEBU
2.5000 mg | INHALATION_SOLUTION | Freq: Four times a day (QID) | RESPIRATORY_TRACT | Status: DC | PRN
  Administered 2019-05-22: 2.5 mg via RESPIRATORY_TRACT
  Filled 2019-05-17: qty 3

## 2019-05-17 MED ORDER — SECUKINUMAB (300 MG DOSE) 150 MG/ML ~~LOC~~ SOAJ: 2.0000 | SUBCUTANEOUS | Status: DC

## 2019-05-17 MED ORDER — FUROSEMIDE 10 MG/ML IJ SOLN
40.0000 mg | Freq: Two times a day (BID) | INTRAMUSCULAR | Status: DC
  Administered 2019-05-17 – 2019-05-19 (×5): 40 mg via INTRAVENOUS
  Filled 2019-05-17 (×5): qty 4

## 2019-05-17 MED ORDER — FLUTICASONE PROPIONATE 50 MCG/ACT NA SUSP
2.0000 | Freq: Every day | NASAL | Status: DC
  Administered 2019-05-18 – 2019-05-26 (×9): 2 via NASAL
  Filled 2019-05-17: qty 16

## 2019-05-17 MED ORDER — ONDANSETRON HCL 4 MG/2ML IJ SOLN
4.0000 mg | Freq: Four times a day (QID) | INTRAMUSCULAR | Status: DC | PRN
  Administered 2019-05-19 – 2019-05-25 (×6): 4 mg via INTRAVENOUS
  Filled 2019-05-17 (×6): qty 2

## 2019-05-17 MED ORDER — METOPROLOL TARTRATE 25 MG PO TABS
75.0000 mg | ORAL_TABLET | Freq: Two times a day (BID) | ORAL | Status: DC
  Administered 2019-05-17 – 2019-05-22 (×12): 75 mg via ORAL
  Filled 2019-05-17 (×13): qty 3

## 2019-05-17 MED ORDER — ATORVASTATIN CALCIUM 20 MG PO TABS
20.0000 mg | ORAL_TABLET | Freq: Every day | ORAL | Status: DC
  Administered 2019-05-17 – 2019-05-25 (×8): 20 mg via ORAL
  Filled 2019-05-17 (×9): qty 1

## 2019-05-17 MED ORDER — ACETAMINOPHEN 325 MG PO TABS
650.0000 mg | ORAL_TABLET | Freq: Two times a day (BID) | ORAL | Status: DC
  Administered 2019-05-17 – 2019-05-26 (×19): 650 mg via ORAL
  Filled 2019-05-17 (×19): qty 2

## 2019-05-17 MED ORDER — DILTIAZEM HCL ER COATED BEADS 120 MG PO CP24
120.0000 mg | ORAL_CAPSULE | Freq: Every day | ORAL | Status: DC
  Administered 2019-05-17 – 2019-05-26 (×10): 120 mg via ORAL
  Filled 2019-05-17 (×11): qty 1

## 2019-05-17 MED ORDER — DICLOFENAC SODIUM 1 % TD GEL
4.0000 g | Freq: Four times a day (QID) | TRANSDERMAL | Status: DC
  Administered 2019-05-17 – 2019-05-25 (×28): 4 g via TOPICAL
  Filled 2019-05-17: qty 100

## 2019-05-17 NOTE — Progress Notes (Signed)
PROGRESS NOTE  Gabrielle [name] ZWC:[id number] DOB: [birth year] DOA: 05/16/2019 PCP: Henrine Screws, MD  HPI/Recap of past 24 hours: Gabrielle [name] is a 74 y.o. female with medical history significant of asthma, chronic atrial fibrillation, chronic diastolic CHF, COPD, coronary artery disease, hypertension and hyperlipidemia who was sent over by PCP secondary to progressive shortness of breath and cough for the last 3 weeks.  Associated with orthopnea and PND.    Worse with ambulation.  She had an echo back in December showing EF of 55 to 60%.  In the ED BNP greater than 1300.  Chest x-ray suggestive of pulmonary edema.  TRH asked to admit.  Started on IV diuretic.  05/17/19: Seen and examined.  Reports persistent dyspnea with ambulation.  She has a nonproductive cough.  Denies chest pain.  Rales noted on exam bilaterally.  Worse at bases.  Bilateral lower extremity edema.  Ongoing diuresing.  Assessment/Plan: Principal Problem:   Acute on chronic diastolic CHF (congestive heart failure) /EF 55 % Active Problems:   Hyperlipidemia   Depression   Hypertensive heart disease with CHF (congestive heart failure) (HCC)   Atrial fibrillation with controlled ventricular response (HCC)   Coronary artery disease due to lipid rich plaque   Obesity  Acute on chronic dCHF Presented with BNP >1300 CXR suggestive of pulmonary edema Ongoing diuresing Continue strict I's and O's and daily weight Last 2D echo done on December 2020 showed normal LVEF 50 to 55% with grade 2 diastolic dysfunction Continue cardiac medications Continue IV Lasix 40 mg twice daily  Acute hypoxic respiratory failure likely secondary to pulmonary edema in the setting of acute on chronic diastolic CHF Not on oxygen supplementation at baseline Currently requiring at least 2 L oxygen by nasal cannula to maintain O2 saturation greater 92% Continue to maintain O2 saturation greater 92%  Chronic A. Fib Rate controlled on  p.o. diltiazem and metoprolol Not on DOAC, unclear why she is not anticoagulated  History of asthma Resume home medications  Chronic anxiety/depression Continue home medications  GERD Continue home medications  Hyperlipidemia Resume home statin  Coronary artery disease Denies any anginal symptom Resume home medications Continue to monitor on telemetry  Essential hypertension Resume home medications Continue to monitor vital signs Blood pressure is at goal.  DVT prophylaxis: Lovenox subcu daily. Code Status: Full Family Communication:  Will call family if okay with the patient.  Disposition Plan:  Patient is from home.  Anticipate discharge to home in the next 24 to 48 hours once volume status and respiratory status have improved.  Barrier to discharge: Ongoing diuresing for acute on chronic diastolic CHF.  Consults called: None      Objective: Vitals:   05/17/19 0149 05/17/19 0521 05/17/19 0858 05/17/19 0919  BP: (!) 157/96 (!) 136/103  102/62  Pulse:  (!) 102  83  Resp: 18 20  18   Temp:  98 F (36.7 C)  (!) 97.4 F (36.3 C)  TempSrc:  Oral  Oral  SpO2: 97% 96% 98% 95%  Weight:      Height:        Intake/Output Summary (Last 24 hours) at 05/17/2019 1241 Last data filed at 05/17/2019 1000 Gross per 24 hour  Intake 243 ml  Output 1000 ml  Net -757 ml   Filed Weights   05/17/19 0110  Weight: 93.6 kg    Exam:  . General: 74 y.o. year-old female well developed well nourished in no acute distress.  Alert and oriented x3. 61  Cardiovascular: Irregular rate and rhythm with no rubs or gallops.   Marland Kitchen Respiratory: Diffuse rales bilaterally.  Worse at bases.  Poor inspiratory effort.  . Abdomen: Soft nontender nondistended with normal bowel sounds x4 quadrants. . Musculoskeletal: 1+ pitting edema in lower extremities bilaterally.  Marland Kitchen Psychiatry: Mood is appropriate for condition and setting   Data Reviewed: CBC: Recent Labs  Lab 05/16/19 1840  WBC 7.8   NEUTROABS 6.9  HGB 10.4*  HCT 35.7*  MCV 93.0  PLT 160   Basic Metabolic Panel: Recent Labs  Lab 05/16/19 1840 05/17/19 0327  NA 141 142  K 3.8 3.1*  CL 103 101  CO2 27 29  GLUCOSE 165* 180*  BUN 19 17  CREATININE 1.19* 1.20*  CALCIUM 9.4 9.4  MG  --  2.0   GFR: Estimated Creatinine Clearance: 43.6 mL/min (A) (by C-G formula based on SCr of 1.2 mg/dL (H)). Liver Function Tests: Recent Labs  Lab 05/16/19 1840  AST 17  ALT 8  ALKPHOS 54  BILITOT 1.2  PROT 7.1  ALBUMIN 4.1   No results for input(s): LIPASE, AMYLASE in the last 168 hours. No results for input(s): AMMONIA in the last 168 hours. Coagulation Profile: No results for input(s): INR, PROTIME in the last 168 hours. Cardiac Enzymes: No results for input(s): CKTOTAL, CKMB, CKMBINDEX, TROPONINI in the last 168 hours. BNP (last 3 results) No results for input(s): PROBNP in the last 8760 hours. HbA1C: No results for input(s): HGBA1C in the last 72 hours. CBG: No results for input(s): GLUCAP in the last 168 hours. Lipid Profile: No results for input(s): CHOL, HDL, LDLCALC, TRIG, CHOLHDL, LDLDIRECT in the last 72 hours. Thyroid Function Tests: No results for input(s): TSH, T4TOTAL, FREET4, T3FREE, THYROIDAB in the last 72 hours. Anemia Panel: No results for input(s): VITAMINB12, FOLATE, FERRITIN, TIBC, IRON, RETICCTPCT in the last 72 hours. Urine analysis:    Component Value Date/Time   COLORURINE YELLOW 02/21/2019 1810   APPEARANCEUR CLEAR 02/21/2019 1810   LABSPEC 1.018 02/21/2019 1810   PHURINE 6.0 02/21/2019 1810   GLUCOSEU NEGATIVE 02/21/2019 1810   HGBUR NEGATIVE 02/21/2019 1810   BILIRUBINUR NEGATIVE 02/21/2019 1810   KETONESUR NEGATIVE 02/21/2019 1810   PROTEINUR 30 (A) 02/21/2019 1810   UROBILINOGEN 1.0 12/02/2010 1023   NITRITE NEGATIVE 02/21/2019 1810   LEUKOCYTESUR NEGATIVE 02/21/2019 1810   Sepsis Labs: @LABRCNTIP (procalcitonin:4,lacticidven:4)  ) Recent Results (from the past 240  hour(s))  Respiratory Panel by RT PCR (Flu A&B, Covid) - Nasopharyngeal Swab     Status: None   Collection Time: 05/16/19  8:05 PM   Specimen: Nasopharyngeal Swab  Result Value Ref Range Status   SARS Coronavirus 2 by RT PCR NEGATIVE NEGATIVE Final    Comment: (NOTE) SARS-CoV-2 target nucleic acids are NOT DETECTED. The SARS-CoV-2 RNA is generally detectable in upper respiratoy specimens during the acute phase of infection. The lowest concentration of SARS-CoV-2 viral copies this assay can detect is 131 copies/mL. A negative result does not preclude SARS-Cov-2 infection and should not be used as the sole basis for treatment or other patient management decisions. A negative result may occur with  improper specimen collection/handling, submission of specimen other than nasopharyngeal swab, presence of viral mutation(s) within the areas targeted by this assay, and inadequate number of viral copies (<131 copies/mL). A negative result must be combined with clinical observations, patient history, and epidemiological information. The expected result is Negative. Fact Sheet for Patients:  PinkCheek.be Fact Sheet for Healthcare Providers:  GravelBags.it This  test is not yet ap proved or cleared by the [place] and  has been authorized for detection and/or diagnosis of SARS-CoV-2 by FDA under an Emergency Use Authorization (EUA). This EUA will remain  in effect (meaning this test can be used) for the duration of the COVID-19 declaration under Section 564(b)(1) of the Act, 21 U.S.C. section 360bbb-3(b)(1), unless the authorization is terminated or revoked sooner.    Influenza A by PCR NEGATIVE NEGATIVE Final   Influenza B by PCR NEGATIVE NEGATIVE Final    Comment: (NOTE) The Xpert Xpress SARS-CoV-2/FLU/RSV assay is intended as an aid in  the diagnosis of influenza from Nasopharyngeal swab specimens and  should not be used as  a sole basis for treatment. Nasal washings and  aspirates are unacceptable for Xpert Xpress SARS-CoV-2/FLU/RSV  testing. Fact Sheet for Patients: https://www.moore.com/ Fact Sheet for Healthcare Providers: https://www.young.biz/ This test is not yet approved or cleared by the [place] FDA and  has been authorized for detection and/or diagnosis of SARS-CoV-2 by  FDA under an Emergency Use Authorization (EUA). This EUA will remain  in effect (meaning this test can be used) for the duration of the  Covid-19 declaration under Section 564(b)(1) of the Act, 21  U.S.C. section 360bbb-3(b)(1), unless the authorization is  terminated or revoked. Performed at [facility], [address], [place], [place] [address]       Studies: DG Chest 2 View  Result Date: 05/16/2019 CLINICAL DATA:  Dyspnea. EXAM: CHEST - 2 VIEW COMPARISON:  April 05, 2019 FINDINGS: Stable mild cardiomegaly, dual lead cardiac pacemaker placement with left-sided battery pack, intact median sternotomy wires, mediastinal surgical clips and CABG markers. Redemonstration of mild pulmonary edema with trace left pleural effusion. Minimal linear atelectasis in the right lateral costophrenic angle. No pneumothorax or pneumonia. Streaky infrahilar consolidation, probably atelectatic or nonspecific bronchitis. Aortic knob calcified atherosclerosis and moderate skeletal degenerative changes. IMPRESSION: Stable mild cardiomegaly and thoracic postoperative changes with mild pulmonary edema, trace left pleural effusion and bilateral infrahilar streaky consolidation that is probably atelectatic or a nonspecific bronchitis. Aortic calcified atherosclerosis. Electronically Signed   By: Laurence Ferrari   On: 05/16/2019 17:57    Scheduled Meds: . acetaminophen  650 mg Oral BID  . ALPRAZolam  1 mg Oral QHS  . atorvastatin  20 mg Oral q1800  . citalopram  20 mg Oral Daily  . clobetasol  cream  1 application Topical BID  . diclofenac sodium  4 g Topical QID  . diltiazem  120 mg Oral Daily  . enoxaparin (LOVENOX) injection  40 mg Subcutaneous Q24H  . fluticasone  2 spray Each Nare Daily  . furosemide  40 mg Intravenous BID  . Melatonin  10 mg Oral QHS  . metoprolol tartrate  75 mg Oral BID  . mometasone-formoterol  2 puff Inhalation BID  . pantoprazole  40 mg Oral Daily  . potassium chloride SA  40 mEq Oral Daily  . sodium chloride flush  3 mL Intravenous Q12H  . sucralfate  1 g Oral TID AC  . umeclidinium bromide  1 puff Inhalation Daily    Continuous Infusions: . sodium chloride 10 mL/hr (05/17/19 0158)  . sodium chloride       LOS: 1 day     Darlin Drop, MD [facility] Pager [phone]  If 7PM-7AM, please contact night-coverage [email or web address] Password [facility] 05/17/2019, 12:41 PM

## 2019-05-18 ENCOUNTER — Telehealth: Payer: Self-pay | Admitting: Cardiology

## 2019-05-18 LAB — BASIC METABOLIC PANEL
Anion gap: 8 (ref 5–15)
BUN: 15 mg/dL (ref 8–23)
CO2: 32 mmol/L (ref 22–32)
Calcium: 9.1 mg/dL (ref 8.9–10.3)
Chloride: 103 mmol/L (ref 98–111)
Creatinine, Ser: 1.11 mg/dL — ABNORMAL HIGH (ref 0.44–1.00)
GFR calc Af Amer: 56 mL/min — ABNORMAL LOW (ref >60)
GFR calc non Af Amer: 49 mL/min — ABNORMAL LOW (ref >60)
Glucose, Bld: 121 mg/dL — ABNORMAL HIGH (ref 70–99)
Potassium: 3.6 mmol/L (ref 3.5–5.1)
Sodium: 143 mmol/L (ref 135–145)

## 2019-05-18 NOTE — Evaluation (Signed)
Physical Therapy Evaluation Patient Details Name: Gabrielle [name] MRN: [id number] DOB: 1945-06-10 Today's Date: 05/18/2019   History of Present Illness  74 y.o. female with medical history significant of asthma, chronic atrial fibrillation, chronic diastolic CHF, COPD, coronary artery disease, hypertension and hyperlipidemia who was sent over by PCP secondary to progressive shortness of breath and cough for the last 3 weeks.  Pt admitted for Acute on chronic diastolic CHF  Clinical Impression  Pt admitted with above diagnosis.  Pt currently with functional limitations due to the deficits listed below (see PT Problem List). Pt will benefit from skilled PT to increase their independence and safety with mobility to allow discharge to the venue listed below.  Pt very eager to ambulate however dyspnea and increased RR limiting mobility at this time.  Pt reports her current respiratory condition is the worst she has ever known it.  Would pt qualify for cardiac or pulmonary rehab?     Follow Up Recommendations Other (comment)(would pt qualify for cardiac or pulmonary rehab?)    Equipment Recommendations  None recommended by PT    Recommendations for Other Services       Precautions / Restrictions Precautions Precaution Comments: monitor sats and RR      Mobility  Bed Mobility Overal bed mobility: Needs Assistance Bed Mobility: Supine to Sit     Supine to sit: Supervision;HOB elevated     General bed mobility comments: increased time and effort; SPO2 93% room air at rest  Transfers Overall transfer level: Needs assistance Equipment used: None Transfers: Sit to/from Stand Sit to Stand: Min guard         General transfer comment: verbal cues for safe technique, pt declined using RW  Ambulation/Gait Ambulation/Gait assistance: Min guard Gait Distance (Feet): 120 Feet   Gait Pattern/deviations: Step-through pattern;Decreased stride length     General Gait Details: pt  pushed dynamap with both hands, Spo2 92 initially and then dropped to 89% on room air; RR 38 upon returning to room; pt with increased work of breathing so 2L O2 [place] reapplied upon return to room  Stairs            Wheelchair Mobility    Modified Rankin (Stroke Patients Only)       Balance Overall balance assessment: (pt denies any recent falls)                                           Pertinent Vitals/Pain Pain Assessment: No/denies pain    Home Living Family/patient expects to be discharged to:: Private residence Living Arrangements: Alone Available Help at Discharge: Family;Available PRN/intermittently Type of Home: House Home Access: Stairs to enter     Home Layout: Able to live on main level with bedroom/bathroom Home Equipment: Dan Humphreys - 2 wheels;Cane - single point      Prior Function Level of Independence: Independent with assistive device(s)         Comments: typically uses cane, mostly been staying at home     Hand Dominance        Extremity/Trunk Assessment        Lower Extremity Assessment Lower Extremity Assessment: Generalized weakness       Communication   Communication: No difficulties  Cognition Arousal/Alertness: Awake/alert Behavior During Therapy: WFL for tasks assessed/performed Overall Cognitive Status: Within Functional Limits for tasks assessed  General Comments      Exercises     Assessment/Plan    PT Assessment Patient needs continued PT services  PT Problem List Cardiopulmonary status limiting activity;Decreased activity tolerance;Decreased mobility       PT Treatment Interventions DME instruction;Therapeutic exercise;Gait training;Functional mobility training;Therapeutic activities;Patient/family education;Stair training    PT Goals (Current goals can be found in the Care Plan section)  Acute Rehab PT Goals PT Goal Formulation: With  patient Time For Goal Achievement: 05/25/19 Potential to Achieve Goals: Good    Frequency Min 3X/week   Barriers to discharge        Co-evaluation               AM-PAC PT "6 Clicks" Mobility  Outcome Measure Help needed turning from your back to your side while in a flat bed without using bedrails?: A Little Help needed moving from lying on your back to sitting on the side of a flat bed without using bedrails?: A Little Help needed moving to and from a bed to a chair (including a wheelchair)?: A Little Help needed standing up from a chair using your arms (e.g., wheelchair or bedside chair)?: A Little Help needed to walk in hospital room?: A Little Help needed climbing 3-5 steps with a railing? : A Little 6 Click Score: 18    End of Session Equipment Utilized During Treatment: Oxygen Activity Tolerance: Patient tolerated treatment well Patient left: in chair;with call bell/phone within reach Nurse Communication: Mobility status PT Visit Diagnosis: Other abnormalities of gait and mobility (R26.89)    Time: 3154-0086 PT Time Calculation (min) (ACUTE ONLY): 18 min   Charges:   PT Evaluation $PT Eval Low Complexity: 1 Low         Kati PT, DPT Acute Rehabilitation Services Office: [phone]  Trena Platt 05/18/2019, 3:15 PM

## 2019-05-18 NOTE — Telephone Encounter (Signed)
Noted - Dr Anne Fu is aware.  No new orders at this time.

## 2019-05-18 NOTE — Telephone Encounter (Signed)
Patient is currently admitted at [facility]. She states that if Dr. Anne Fu wants any tests or blood work to be done while she is there to please call [facility]. She says Dr. Margo Aye is the on call doctor.

## 2019-05-18 NOTE — Progress Notes (Signed)
PROGRESS NOTE  ZAEDA MCFERRAN QQP:[id number] DOB: 10-02-1945 DOA: 05/16/2019 PCP: Henrine Screws, MD  HPI/Recap of past 24 hours: Sarita Haver Placide is a 74 y.o. female with medical history significant of asthma, chronic atrial fibrillation, chronic diastolic CHF, COPD, coronary artery disease, hypertension and hyperlipidemia who was sent over by PCP secondary to progressive shortness of breath and cough for the last 3 weeks.  Associated with orthopnea and PND.    Worse with ambulation.  She had an echo back in December showing EF of 55 to 60%.  In the ED BNP greater than 1300.  Chest x-ray suggestive of pulmonary edema.  TRH asked to admit.  Started on IV diuretic.  05/18/19: Seen and examined.  Ongoing diuresing with IV Lasix.  She would like to get up and walk and have her oxygen level checked with ambulation.  Home O2 evaluation ordered.  Bedside RN reports coughing after eating this morning. Speech therapist consulted to assess for possible dysphagia   Assessment/Plan: Principal Problem:   Acute on chronic diastolic CHF (congestive heart failure) /EF 55 % Active Problems:   Hyperlipidemia   Depression   Hypertensive heart disease with CHF (congestive heart failure) (HCC)   Atrial fibrillation with controlled ventricular response (HCC)   Coronary artery disease due to lipid rich plaque   Obesity  Acute on chronic dCHF Presented with BNP >1300 CXR suggestive of pulmonary edema Ongoing diuresing Net I&O -1.1 L Continue strict I's and O's and daily weight Last 2D echo done on December 2020 showed normal LVEF 50 to 55% with grade 2 diastolic dysfunction Continue cardiac medications Continue IV Lasix 40 mg twice daily Monitor urine output, blood pressure and electrolytes while on IV diuretics.  Acute hypoxic respiratory failure likely secondary to pulmonary edema in the setting of acute on chronic diastolic CHF Not on oxygen supplementation at baseline Currently requiring at  least 2 L oxygen by nasal cannula to maintain O2 saturation greater 92% Continue to maintain O2 saturation greater 92% Obtain home O2 evaluation for DC planning Use incentive spirometer to minimize fluid buildup in the lungs.  Possible dysphagia Coughing after eating this morning Speech therapy to assess  Chronic A. Fib Rate controlled on p.o. diltiazem and metoprolol Not on DOAC due to significant history of GI bleed  History of asthma Resume home medications  Chronic anxiety/depression Continue home medications  GERD Continue home medications  Hyperlipidemia Continue home statin  Coronary artery disease Denies any anginal symptom Continue home medications Continue to monitor on telemetry  Essential hypertension Resume home medications Continue to monitor vital signs Blood pressure is at goal.  DVT prophylaxis: Lovenox subcu daily. Code Status: Full Family Communication:  Will call family if okay with the patient.  Disposition Plan:  Patient is from home.  Anticipate discharge to home in the next 24 hours once volume status and respiratory status have improved.  Barrier to discharge: Ongoing diuresing for acute on chronic diastolic CHF.  Consults called: None      Objective: Vitals:   05/18/19 0432 05/18/19 0549 05/18/19 0837 05/18/19 1152  BP:  105/70 116/77 (!) 104/56  Pulse:  70 79 93  Resp:  20 17 20   Temp:  97.6 F (36.4 C) 97.6 F (36.4 C) 98.1 F (36.7 C)  TempSrc:  Oral Oral Oral  SpO2:  95% 97% 96%  Weight: 93 kg     Height:        Intake/Output Summary (Last 24 hours) at 05/18/2019 1237 Last data  filed at 05/18/2019 1146 Gross per 24 hour  Intake 1080.33 ml  Output 1450 ml  Net -369.67 ml   Filed Weights   05/17/19 0110 05/18/19 0432  Weight: 93.6 kg 93 kg    Exam:  . General: 74 y.o. year-old female well-developed well-nourished no stenosis.  Alert and oriented x3.   . Cardiovascular: Irregular rate and rhythm no rubs or  gallops. Marland Kitchen Respiratory: Diffuse rales bilaterally.  Good inspiratory effort.   .  Abdomen: Obese nontender bowel sounds present.   . Musculoskeletal: Trace edema in lower extremities bilaterally. Marland Kitchen Psychiatry: Mood is appropriate for condition and setting.  Data Reviewed: CBC: Recent Labs  Lab 05/16/19 1840  WBC 7.8  NEUTROABS 6.9  HGB 10.4*  HCT 35.7*  MCV 93.0  PLT 178   Basic Metabolic Panel: Recent Labs  Lab 05/16/19 1840 05/17/19 0327 05/18/19 0420  NA 141 142 143  K 3.8 3.1* 3.6  CL 103 101 103  CO2 27 29 32  GLUCOSE 165* 180* 121*  BUN 19 17 15   CREATININE 1.19* 1.20* 1.11*  CALCIUM 9.4 9.4 9.1  MG  --  2.0  --    GFR: Estimated Creatinine Clearance: 47 mL/min (A) (by C-G formula based on SCr of 1.11 mg/dL (H)). Liver Function Tests: Recent Labs  Lab 05/16/19 1840  AST 17  ALT 8  ALKPHOS 54  BILITOT 1.2  PROT 7.1  ALBUMIN 4.1   No results for input(s): LIPASE, AMYLASE in the last 168 hours. No results for input(s): AMMONIA in the last 168 hours. Coagulation Profile: No results for input(s): INR, PROTIME in the last 168 hours. Cardiac Enzymes: No results for input(s): CKTOTAL, CKMB, CKMBINDEX, TROPONINI in the last 168 hours. BNP (last 3 results) No results for input(s): PROBNP in the last 8760 hours. HbA1C: No results for input(s): HGBA1C in the last 72 hours. CBG: No results for input(s): GLUCAP in the last 168 hours. Lipid Profile: No results for input(s): CHOL, HDL, LDLCALC, TRIG, CHOLHDL, LDLDIRECT in the last 72 hours. Thyroid Function Tests: No results for input(s): TSH, T4TOTAL, FREET4, T3FREE, THYROIDAB in the last 72 hours. Anemia Panel: No results for input(s): VITAMINB12, FOLATE, FERRITIN, TIBC, IRON, RETICCTPCT in the last 72 hours. Urine analysis:    Component Value Date/Time   COLORURINE YELLOW 02/21/2019 1810   APPEARANCEUR CLEAR 02/21/2019 1810   LABSPEC 1.018 02/21/2019 1810   PHURINE 6.0 02/21/2019 1810   GLUCOSEU  NEGATIVE 02/21/2019 1810   HGBUR NEGATIVE 02/21/2019 1810   BILIRUBINUR NEGATIVE 02/21/2019 1810   KETONESUR NEGATIVE 02/21/2019 1810   PROTEINUR 30 (A) 02/21/2019 1810   UROBILINOGEN 1.0 12/02/2010 1023   NITRITE NEGATIVE 02/21/2019 1810   LEUKOCYTESUR NEGATIVE 02/21/2019 1810   Sepsis Labs: @LABRCNTIP (procalcitonin:4,lacticidven:4)  ) Recent Results (from the past 240 hour(s))  Respiratory Panel by RT PCR (Flu A&B, Covid) - Nasopharyngeal Swab     Status: None   Collection Time: 05/16/19  8:05 PM   Specimen: Nasopharyngeal Swab  Result Value Ref Range Status   SARS Coronavirus 2 by RT PCR NEGATIVE NEGATIVE Final    Comment: (NOTE) SARS-CoV-2 target nucleic acids are NOT DETECTED. The SARS-CoV-2 RNA is generally detectable in upper respiratoy specimens during the acute phase of infection. The lowest concentration of SARS-CoV-2 viral copies this assay can detect is 131 copies/mL. A negative result does not preclude SARS-Cov-2 infection and should not be used as the sole basis for treatment or other patient management decisions. A negative result may occur with  improper specimen collection/handling, submission of specimen other than nasopharyngeal swab, presence of viral mutation(s) within the areas targeted by this assay, and inadequate number of viral copies (<131 copies/mL). A negative result must be combined with clinical observations, patient history, and epidemiological information. The expected result is Negative. Fact Sheet for Patients:  PinkCheek.be Fact Sheet for Healthcare Providers:  GravelBags.it This test is not yet ap proved or cleared by the [place] FDA and  has been authorized for detection and/or diagnosis of SARS-CoV-2 by FDA under an Emergency Use Authorization (EUA). This EUA will remain  in effect (meaning this test can be used) for the duration of the COVID-19 declaration under Section  564(b)(1) of the Act, 21 U.S.C. section 360bbb-3(b)(1), unless the authorization is terminated or revoked sooner.    Influenza A by PCR NEGATIVE NEGATIVE Final   Influenza B by PCR NEGATIVE NEGATIVE Final    Comment: (NOTE) The Xpert Xpress SARS-CoV-2/FLU/RSV assay is intended as an aid in  the diagnosis of influenza from Nasopharyngeal swab specimens and  should not be used as a sole basis for treatment. Nasal washings and  aspirates are unacceptable for Xpert Xpress SARS-CoV-2/FLU/RSV  testing. Fact Sheet for Patients: PinkCheek.be Fact Sheet for Healthcare Providers: GravelBags.it This test is not yet approved or cleared by the [place] FDA and  has been authorized for detection and/or diagnosis of SARS-CoV-2 by  FDA under an Emergency Use Authorization (EUA). This EUA will remain  in effect (meaning this test can be used) for the duration of the  Covid-19 declaration under Section 564(b)(1) of the Act, 21  U.S.C. section 360bbb-3(b)(1), unless the authorization is  terminated or revoked. Performed at [facility], [place] [address] [place], [place] [address]       Studies: No results found.  Scheduled Meds: . acetaminophen  650 mg Oral BID  . ALPRAZolam  1 mg Oral QHS  . atorvastatin  20 mg Oral q1800  . citalopram  20 mg Oral Daily  . clobetasol cream  1 application Topical BID  . diclofenac sodium  4 g Topical QID  . diltiazem  120 mg Oral Daily  . enoxaparin (LOVENOX) injection  40 mg Subcutaneous Q24H  . fluticasone  2 spray Each Nare Daily  . furosemide  40 mg Intravenous BID  . Melatonin  10 mg Oral QHS  . metoprolol tartrate  75 mg Oral BID  . mometasone-formoterol  2 puff Inhalation BID  . pantoprazole  40 mg Oral Daily  . potassium chloride SA  40 mEq Oral Daily  . sodium chloride flush  3 mL Intravenous Q12H  . sucralfate  1 g Oral TID AC  . umeclidinium bromide  1 puff  Inhalation Daily    Continuous Infusions: . sodium chloride 10 mL/hr (05/17/19 0158)  . sodium chloride       LOS: 2 days     Kayleen Memos, MD [facility] Pager [phone]  If 7PM-7AM, please contact night-coverage [email or web address] Password [facility] 05/18/2019, 12:37 PM

## 2019-05-19 DIAGNOSIS — I4891 Unspecified atrial fibrillation: Secondary | ICD-10-CM

## 2019-05-19 DIAGNOSIS — I2583 Coronary atherosclerosis due to lipid rich plaque: Secondary | ICD-10-CM

## 2019-05-19 DIAGNOSIS — I5033 Acute on chronic diastolic (congestive) heart failure: Secondary | ICD-10-CM

## 2019-05-19 DIAGNOSIS — I2510 Atherosclerotic heart disease of native coronary artery without angina pectoris: Secondary | ICD-10-CM

## 2019-05-19 DIAGNOSIS — E782 Mixed hyperlipidemia: Secondary | ICD-10-CM

## 2019-05-19 LAB — BASIC METABOLIC PANEL
Anion gap: 12 (ref 5–15)
BUN: 17 mg/dL (ref 8–23)
CO2: 30 mmol/L (ref 22–32)
Calcium: 9.7 mg/dL (ref 8.9–10.3)
Chloride: 101 mmol/L (ref 98–111)
Creatinine, Ser: 1.29 mg/dL — ABNORMAL HIGH (ref 0.44–1.00)
GFR calc Af Amer: 47 mL/min — ABNORMAL LOW (ref >60)
GFR calc non Af Amer: 40 mL/min — ABNORMAL LOW (ref >60)
Glucose, Bld: 158 mg/dL — ABNORMAL HIGH (ref 70–99)
Potassium: 4.1 mmol/L (ref 3.5–5.1)
Sodium: 143 mmol/L (ref 135–145)

## 2019-05-19 MED ORDER — FUROSEMIDE 10 MG/ML IJ SOLN
80.0000 mg | Freq: Two times a day (BID) | INTRAMUSCULAR | Status: AC
  Administered 2019-05-19 – 2019-05-20 (×2): 80 mg via INTRAVENOUS
  Filled 2019-05-19 (×2): qty 8

## 2019-05-19 NOTE — Progress Notes (Addendum)
PT Cancellation Note  Patient Details Name: Gabrielle [name] MRN: [id number] DOB: [birth year]   Cancelled Treatment:    Reason Eval/Treat Not Completed: Fatigue/lethargy limiting ability to participate;Medical issues which prohibited therapy (pt with 3/4 dyspnea at rest in bed, RN stated pt has had, "a rough day". Will follow.)   Tamala Ser PT 05/19/2019  Acute Rehabilitation Services Pager [phone] Office [phone]

## 2019-05-19 NOTE — Consult Note (Addendum)
Cardiology Consultation:   Patient ID: MANHATTAN MCCUEN; [id number]; 03-16-1945   Admit date: 05/16/2019 Date of Consult: 05/19/2019  Primary Care Provider: Henrine Screws, MD Primary Cardiologist: Donato Schultz, MD Primary Electrophysiologist:  None   Patient Profile:   Gabrielle [name] is a 74 y.o. female with a PMH of CAD s/p CABG in 2010, chronic atrial fibrillation, chronic diastolic CHF, SSS s/p PPM, HTN, HLD, COPD on home O2, and tobacco abuse, who is being seen today for the evaluation of CHF at the request of the patient.  History of Present Illness:   Gabrielle [name] was in her usual state of health until a few weeks ago when she began experiencing progressive SOB and cough. She also noted increased LE edema and had difficulty putting her shoes on. She describes orthostatic hypotension symptoms on occasion. No complaints of chest pain, palpitations, orthopnea, PND, or syncope. She reports compliance with her medications though does report high salt intake. She doesn't cook much and reports eating bologna sandwiches and soups often. We discussed the importance of maintaining a low sodium diet with her heart failure. She does not weight herself daily but reports she has never weighed more than 200lbs until this admission.   She was last evaluated by cardiology at an outpatient visit with Dr. Anne Fu 04/10/19, at which time she noted improvement in her breathing with increased lasix and better rate control of her atrial fibrillation (4 days prior was noted to have HR in the 170s). Her lasix was decreased to 20mg  daily given soft blood pressures and she was continued on increased metoprolol 75mg  BID.  She follows with Dr. for EP care and was last seen 04/06/19 who recommended AV nodal ablation if rates did not improve with increased metoprolol. Her last echocardiogram was 01/2019 and showed EF 50-55%, G2DD, mild apical hypokinesis, severe LAE, and mild MR.    At the time of this  evaluation she reports minimal improvement in her SOB. She states her LE edema has improved. She can now wear her shoes. She had several episodes of NBNB emesis this morning. No complaints of abdominal pain or diarrhea. No recent fevers. No sick contacts.   Hospital course: BP somewhat labile, otherwise VSS. Labs notable for Cr 1.19>1.29 (baseline appears to be about 1.0), electrolytes wnl, Hgb 10.4 (baseline 12), PLT 178, HsTrop 6>9, BNP elevated to 1300s, influenza/COVID-19 negative. CXR suggested pulmonary edema. She was admitted to medicine and started on IV lasix 40mg  BID. I&Os are incomplete due to unmeasured urine output. Weight is down from 206 lbs on admission to 203 lbs today. Cardiology asked to evaluate for CHF.    Past Medical History:  Diagnosis Date  . Anemia   . Arthritis   . Asthma   . Atrial fibrillation with controlled ventricular response (HCC) 11/2009   a. not on anticoagulation due to recurrent GI bleeding while on Eliquis.  . Chronic bronchitis   . Chronic diastolic CHF (congestive heart failure) (HCC)    a. Echo 12/2015: EF 55-60% w/ Grade 3 DD  . Chronic headache   . COPD (chronic obstructive pulmonary disease) (HCC)   . Depression   . Heart attack (HCC)   . Hyperlipidemia   . Hypertension     Past Surgical History:  Procedure Laterality Date  . CAD-CABG     x7, brief post-op Atrial Fib  . CESAREAN SECTION     x2  . COLONOSCOPY WITH PROPOFOL N/A 10/24/2015   Procedure: COLONOSCOPY WITH PROPOFOL;  Surgeon:  Laurence Spates, MD;  Location: [facility] ENDOSCOPY;  Service: Endoscopy;  Laterality: N/A;  . CORONARY ARTERY BYPASS GRAFT  2011  . EP IMPLANTABLE DEVICE N/A 12/26/2015   Procedure: Pacemaker Implant;  Surgeon: Evans Lance, MD;  Location: [facility] LAB;  Service: Cardiovascular;  Laterality: N/A;  . ESOPHAGOGASTRODUODENOSCOPY N/A 10/22/2015   Procedure: ESOPHAGOGASTRODUODENOSCOPY (EGD);  Surgeon: Laurence Spates, MD;  Location: [facility] ENDOSCOPY;  Service:  Endoscopy;  Laterality: N/A;  . ESOPHAGOGASTRODUODENOSCOPY N/A 12/24/2015   Procedure: ESOPHAGOGASTRODUODENOSCOPY (EGD);  Surgeon: Laurence Spates, MD;  Location: [facility] ENDOSCOPY;  Service: Endoscopy;  Laterality: N/A;  . JOINT REPLACEMENT Bilateral   . TONSILLECTOMY    . TOTAL KNEE ARTHROPLASTY       Home Medications:  Prior to Admission medications   Medication Sig Start Date End Date Taking? Authorizing Provider  Acetaminophen (TYLENOL 8 HOUR ARTHRITIS PAIN PO) Take 2 tablets by mouth 2 (two) times daily. Back pain   Yes [provider]  albuterol (PROVENTIL HFA;VENTOLIN HFA) 108 (90 Base) MCG/ACT inhaler Inhale 2 puffs into the lungs every 6 (six) hours as needed for wheezing or shortness of breath.   Yes [provider]  ALPRAZolam Duanne Moron) 1 MG tablet Take 1 tablet by mouth at bedtime.  04/05/19  Yes [provider]  atorvastatin (LIPITOR) 20 MG tablet Take 1 tablet (20 mg total) by mouth daily at 6 PM. 02/02/19  Yes Skains, Thana Farr, MD  budesonide-formoterol (SYMBICORT) 160-4.5 MCG/ACT inhaler Inhale 2 puffs into the lungs 2 (two) times daily. 01/15/14  Yes Elsie Stain, MD  citalopram (CELEXA) 20 MG tablet Take 40 mg by mouth daily.  07/08/16  Yes [provider]  clobetasol cream (TEMOVATE) 2.22 % Apply 1 application topically 2 (two) times daily. Affected skin 05/21/16  Yes Barton Dubois, MD  COSENTYX SENSOREADY, 300 MG, 150 MG/ML SOAJ Inject 2 Syringes into the skin every 28 (twenty-eight) days. 01/31/19  Yes [provider]  diclofenac sodium (VOLTAREN) 1 % GEL Apply 4 g topically 4 (four) times daily. 07/26/18  Yes Deno Etienne, DO  diltiazem (TIAZAC) 120 MG 24 hr capsule Take 1 capsule (120 mg total) by mouth daily. 02/02/19  Yes Jerline Pain, MD  docusate sodium (COLACE) 100 MG capsule Take 100 mg by mouth 2 (two) times daily as needed for moderate constipation.    Yes [provider]  fluticasone (FLONASE) 50 MCG/ACT nasal spray Place  2 sprays into both nostrils daily. 12/21/15  Yes Florencia Reasons, MD  furosemide (LASIX) 20 MG tablet Take 1 tablet (20 mg total) by mouth daily. Patient taking differently: Take 40 mg by mouth daily.  02/02/19  Yes Jerline Pain, MD  Melatonin 10 MG TABS Take 10 mg by mouth at bedtime.   Yes [provider]  metoprolol tartrate (LOPRESSOR) 50 MG tablet Take 1.5 tablets (75 mg total) by mouth 2 (two) times daily. 04/12/19  Yes Jerline Pain, MD  pantoprazole (PROTONIX) 40 MG tablet Take 1 tablet (40 mg total) by mouth daily. 02/02/19  Yes Jerline Pain, MD  potassium chloride SA (KLOR-CON) 20 MEQ tablet Take 1 tablet (20 mEq total) by mouth daily. Patient taking differently: Take 40 mEq by mouth daily.  02/02/19  Yes Jerline Pain, MD  sucralfate (CARAFATE) 1 g tablet Take 1 g by mouth 3 (three) times daily before meals.   Yes [provider]  tiotropium (SPIRIVA) 18 MCG inhalation capsule Place 18 mcg into inhaler and inhale daily.  Yes [provider]  tiZANidine (ZANAFLEX) 4 MG tablet Take 4 mg by mouth 2 (two) times daily as needed for muscle spasms.  03/16/19  Yes [provider]  guaiFENesin-dextromethorphan (ROBITUSSIN DM) 100-10 MG/5ML syrup Take 5 mLs by mouth every 4 (four) hours as needed for cough (chest congestion). Patient not taking: Reported on 05/16/2019 03/01/19   Leatha GildingGherghe, Costin M, MD    Inpatient Medications: Scheduled Meds: . acetaminophen  650 mg Oral BID  . ALPRAZolam  1 mg Oral QHS  . atorvastatin  20 mg Oral q1800  . citalopram  20 mg Oral Daily  . clobetasol cream  1 application Topical BID  . diclofenac sodium  4 g Topical QID  . diltiazem  120 mg Oral Daily  . enoxaparin (LOVENOX) injection  40 mg Subcutaneous Q24H  . fluticasone  2 spray Each Nare Daily  . furosemide  80 mg Intravenous BID  . Melatonin  10 mg Oral QHS  . metoprolol tartrate  75 mg Oral BID  . mometasone-formoterol  2 puff Inhalation BID  . pantoprazole  40 mg Oral  Daily  . potassium chloride SA  40 mEq Oral Daily  . sodium chloride flush  3 mL Intravenous Q12H  . sucralfate  1 g Oral TID AC  . umeclidinium bromide  1 puff Inhalation Daily   Continuous Infusions: . sodium chloride 10 mL/hr (05/17/19 0158)  . sodium chloride     PRN Meds: sodium chloride, acetaminophen, albuterol, docusate sodium, guaiFENesin-dextromethorphan, ondansetron (ZOFRAN) IV, sodium chloride flush, tiZANidine  Allergies:    Allergies  Allergen Reactions  . Novocain [Procaine] Hives and Palpitations  . Penicillins Hives and Other (See Comments)    Has patient had a PCN reaction causing immediate rash, facial/tongue/throat swelling, SOB or lightheadedness with hypotension: No Has patient had a PCN reaction causing severe rash involving mucus membranes or skin necrosis: No Has patient had a PCN reaction that required hospitalization No Has patient had a PCN reaction occurring within the last 10 years: No If all of the above answers are "NO", then may proceed with Cephalosporin use.    Social History:   Social History   Socioeconomic History  . Marital status: Divorced    Spouse name: Not on file  . Number of children: Not on file  . Years of education: Not on file  . Highest education level: Not on file  Occupational History  . Occupation: retired Nutritional therapistcustomer service    Employer: [employer]  Tobacco Use  . Smoking status: Current Some Day Smoker    Packs/day: 0.20    Types: Cigarettes  . Smokeless tobacco: Never Used  . Tobacco comment: "Occasionally I will smoke a cigarette"   Substance and Sexual Activity  . Alcohol use: No    Alcohol/week: 0.0 standard drinks  . Drug use: No  . Sexual activity: Not on file  Other Topics Concern  . Not on file  Social History Narrative  . Not on file   Social Determinants of Health   Financial Resource Strain:   . Difficulty of Paying Living Expenses:   Food Insecurity:   . Worried About Programme researcher, broadcasting/film/videounning Out of Food in the Last  Year:   . Baristaan Out of Food in the Last Year:   Transportation Needs:   . Freight forwarderLack of Transportation (Medical):   Marland Kitchen. Lack of Transportation (Non-Medical):   Physical Activity:   . Days of Exercise per Week:   . Minutes of Exercise per Session:   Stress:   .  Feeling of Stress :   Social Connections:   . Frequency of Communication with Friends and Family:   . Frequency of Social Gatherings with Friends and Family:   . Attends Religious Services:   . Active Member of Clubs or Organizations:   . Attends Banker Meetings:   Marland Kitchen Marital Status:   Intimate Partner Violence:   . Fear of Current or Ex-Partner:   . Emotionally Abused:   Marland Kitchen Physically Abused:   . Sexually Abused:     Family History:    Family History  Problem Relation Age of Onset  . Diabetes Mother   . Hypertension Mother   . Heart disease Mother        before age 16  . Heart disease Father        before age 67  . Hypertension Father   . Heart attack Father   . Lung cancer Maternal Grandfather   . Lung cancer Paternal Grandfather   . Hypertension Daughter   . Stroke Paternal Grandmother      ROS:  Please see the history of present illness.  ROS  All other ROS reviewed and negative.     Physical Exam/Data:   Vitals:   05/18/19 1152 05/18/19 2039 05/18/19 2047 05/19/19 0444  BP: (!) 104/56  (!) 151/79 (!) 142/83  Pulse: 93  73 70  Resp: Temp: 98.1 F (36.7 C)  98.4 F (36.9 C) 98.3 F (36.8 C)  TempSrc: Oral  Oral Oral  SpO2: 96% 97% 96% 99%  Weight:    92.4 kg  Height:        Intake/Output Summary (Last 24 hours) at 05/19/2019 1213 Last data filed at 05/19/2019 1141 Gross per 24 hour  Intake 1540 ml  Output 1050 ml  Net 490 ml   Filed Weights   05/17/19 0110 05/18/19 0432 05/19/19 0444  Weight: 93.6 kg 93 kg 92.4 kg   Body mass index is 36.65 kg/m.  General:  Morbidly obese female sitting upright in bed in NAD HEENT: sclera anicteric  Neck: difficult to assess JVD given  body habitus Vascular: No carotid bruits; distal pulses 2+ bilaterally Cardiac:  normal S1, S2; IRIR; no murmurs, rubs, or gallops Lungs:  Crackles at lung bases with scattered expiratory wheezing Abd: NABS, soft, obese, nontender, no hepatomegaly Ext: no pitting edema Musculoskeletal:  No deformities, BUE and BLE strength normal and equal Skin: warm and dry  Neuro:  CNs 2-12 intact, no focal abnormalities noted Psych:  Normal affect   EKG:  The EKG was personally reviewed and demonstrates:  Atrial fibrillation with rate 97 bpm, old anterior/inferior infarct, non-specific IVCD, no STE/D, no TWI Telemetry:  Telemetry was personally reviewed and demonstrates:  Atrial fibrillation with generally well controlled rates, up to 110s late morning though did not get her metoprolol due to N/V.  Relevant CV Studies: Echocardiogram 01/2019: 1. Left ventricular ejection fraction, by visual estimation, is 50 to  55%. The left ventricle has normal function. There is no left ventricular  hypertrophy.  2. Left ventricular diastolic parameters are consistent with Grade II  diastolic dysfunction (pseudonormalization).  3. Mildly dilated left ventricular internal cavity size.  4. The left ventricle demonstrates regional wall motion abnormalities.  5. Global right ventricle was not well visualized.The right ventricular  size is not well visualized. Right vetricular wall thickness was not  assessed.  6. Left atrial size was severely dilated.  7. Right atrial size was not well visualized.  8. Moderate mitral annular calcification.  9. The mitral valve is normal in structure. Mild mitral valve  regurgitation. No evidence of mitral stenosis.  10. The tricuspid valve is not well visualized.  11. The aortic valve is tricuspid. Aortic valve regurgitation is not  visualized. Mild aortic valve sclerosis without stenosis.  12. The pulmonic valve was not well visualized. Pulmonic valve  regurgitation is  trivial.  13. Mildly elevated pulmonary artery systolic pressure.  14. The inferior vena cava is normal in size with greater than 50%  respiratory variability, suggesting right atrial pressure of 3 mmHg.  15. Technically difficult; definity used; mild apical hypokinesis; overall  preserved LV function; grade 2 diastolic dysfunction; mild LVE; severe  LAE; mild MR; right heart not well visualized.     Laboratory Data:  Chemistry Recent Labs  Lab 05/17/19 0327 05/18/19 0420 05/19/19 0419  NA 142 143 143  K 3.1* 3.6 4.1  CL 101 103 101  CO2 29 32 30  GLUCOSE 180* 121* 158*  BUN 17 15 17   CREATININE 1.20* 1.11* 1.29*  CALCIUM 9.4 9.1 9.7  GFRNONAA 44* 49* 40*  GFRAA 51* 56* 47*  ANIONGAP 12 8 12     Recent Labs  Lab 05/16/19 1840  PROT 7.1  ALBUMIN 4.1  AST 17  ALT 8  ALKPHOS 54  BILITOT 1.2   Hematology Recent Labs  Lab 05/16/19 1840  WBC 7.8  RBC 3.84*  HGB 10.4*  HCT 35.7*  MCV 93.0  MCH 27.1  MCHC 29.1*  RDW 18.2*  PLT 178   Cardiac EnzymesNo results for input(s): TROPONINI in the last 168 hours. No results for input(s): TROPIPOC in the last 168 hours.  BNP Recent Labs  Lab 05/16/19 1840  BNP 1,338.5*    DDimer No results for input(s): DDIMER in the last 168 hours.  Radiology/Studies:  DG Chest 2 View  Result Date: 05/16/2019 CLINICAL DATA:  Dyspnea. EXAM: CHEST - 2 VIEW COMPARISON:  April 05, 2019 FINDINGS: Stable mild cardiomegaly, dual lead cardiac pacemaker placement with left-sided battery pack, intact median sternotomy wires, mediastinal surgical clips and CABG markers. Redemonstration of mild pulmonary edema with trace left pleural effusion. Minimal linear atelectasis in the right lateral costophrenic angle. No pneumothorax or pneumonia. Streaky infrahilar consolidation, probably atelectatic or nonspecific bronchitis. Aortic knob calcified atherosclerosis and moderate skeletal degenerative changes. IMPRESSION: Stable mild cardiomegaly and  thoracic postoperative changes with mild pulmonary edema, trace left pleural effusion and bilateral infrahilar streaky consolidation that is probably atelectatic or a nonspecific bronchitis. Aortic calcified atherosclerosis. Electronically Signed   By: 05/18/2019   On: 05/16/2019 17:57    Assessment and Plan:   1. Acute on chronic diastolic CHF: patient presented with progressive SOB. BNP >1300. CXR with pulmonary edema. Started on IV lasix with weights down 3 lbs from admission; I&Os incomplete. She has had improvement in LE edema but SOB persists - Favor increasing lasix to 80mg  BID x2 doses then reassess - Continue metoprolol - Continue to monitor strict I&Os and daily weights - Continue to monitor electrolytes closely and replete as needed to maintain K>4, Mg >2  2. Paroxsymal atrial fibrillation: rates were poorly controlled on device check 04/2018, improved with increase metoprolol (75mg  BID). Rates have been fairly well controlled this admission, though up a bit this morning after missing morning metoprolol dose due to N/V. RN gave metoprolol at the time of my eval. Not on anticoagulation due to recurrent GI bleeding. - Continue diltiazem and metoprolol for rate  control.  3. CAD s/p CABG in 2010: no anginal complaints. HsTrop's negative. EKG non-ischemic. Not on aspirin given severe GI bleed in the past - Continue statin and metoprolol  4. HTN: BP somewhat labile but overall stable - Continue diltiazem and metoprolol  5. HLD: no recent lipids on file - Continue atorvastatin  6. COPD on home O2: could be contributing to SOB. - Continue management per primary team  7. N/V: unclear etiology. No fevers, abdominal pain, diarrhea, or sick contacts - Will defer to primary team.  8. Tobacco abuse: - Continue to encourage cessation   For questions or updates, please contact [facility] Please consult [email or web address] for contact info under Cardiology/STEMI.   Signed, Beatriz Stallion, PA-C  05/19/2019 12:13 PM [phone]  I have examined the patient and reviewed assessment and plan and discussed with patient.  Agree with above as stated.    AFib, rate controlled.  Agree with increased Lasix as noted above.  Per the patient, LE edema has decreased significantly.Follow renal function.   Chronic shortness of breath from COPD, on home oxygen.  Prior GI bleeding precludes anticoagulation. CAD: no signs of ischemia.  Lance Muss

## 2019-05-19 NOTE — Evaluation (Signed)
Occupational Therapy Evaluation Patient Details Name: Gabrielle [name] MRN: [id number] DOB: 11/14/1945 Today's Date: 05/19/2019    History of Present Illness 74 y.o. female with medical history significant of asthma, chronic atrial fibrillation, chronic diastolic CHF, COPD, coronary artery disease, hypertension and hyperlipidemia who was sent over by PCP secondary to progressive shortness of breath and cough for the last 3 weeks.  Pt admitted for Acute on chronic diastolic CHF   Clinical Impression   Patient is a 74 year old female that lives alone in a split level home with 4 steps to enter, 8 steps to lower level with laundry set up. Patient reports a friend is going to move in with her "once she gets better from covid," apparently she has been sick since Thanksgiving but still running a fever per patient. Patient does not drive, her sister has been getting her groceries, pt has a daughter that lives locally "she works all the time" anda son that lives in [place]. Patient is modified independent at baseline with occasional use of cane for ambulation. Currently patient is min guard for functional mobility and transfers, min A for LB ADLs and set up for UB ADLs. Recommend continued acute OT services for education regarding energy conservation and safety with self care/transfers.      Follow Up Recommendations  No OT follow up;Supervision - Intermittent    Equipment Recommendations  None recommended by OT       Precautions / Restrictions Precautions Precautions: Fall Precaution Comments: monitor sats and RR Restrictions Weight Bearing Restrictions: No      Mobility Bed Mobility Overal bed mobility: Needs Assistance Bed Mobility: Supine to Sit     Supine to sit: Supervision;HOB elevated     General bed mobility comments: increased time and effort, reports mild dizziness upon sitting up  Transfers Overall transfer level: Needs assistance Equipment used: Rolling walker (2  wheeled) Transfers: Sit to/from Stand Sit to Stand: Min guard         General transfer comment: verbal cues for safe management of walker    Balance Overall balance assessment: Needs assistance Sitting-balance support: No upper extremity supported;Feet supported Sitting balance-Leahy Scale: Good     Standing balance support: Bilateral upper extremity supported;No upper extremity supported;During functional activity Standing balance-Leahy Scale: Fair Standing balance comment: pt utilize rolling walker when ambulating to bathroom but demo's ability to stand static/dynamic without external support                           ADL either performed or assessed with clinical judgement   ADL Overall ADL's : Needs assistance/impaired     Grooming: Wash/dry hands;Min guard;Standing   Upper Body Bathing: Set up;Sitting   Lower Body Bathing: Minimal assistance;Sitting/lateral leans;Sit to/from stand   Upper Body Dressing : Set up;Sitting   Lower Body Dressing: Minimal assistance;Sitting/lateral leans;Sit to/from stand   Toilet Transfer: Min guard;Ambulation;Regular Toilet;RW Toilet Transfer Details (indicate cue type and reason): cues for body mechanics Toileting- Clothing Manipulation and Hygiene: Min guard;Sitting/lateral lean;Sit to/from stand Toileting - Clothing Manipulation Details (indicate cue type and reason): for safety in standing to doff soiled mesh underwear     Functional mobility during ADLs: Min guard;Rolling walker                    Pertinent Vitals/Pain Pain Assessment: Faces Faces Pain Scale: No hurt     Hand Dominance Right   Extremity/Trunk Assessment Upper Extremity Assessment Upper Extremity  Assessment: Generalized weakness   Lower Extremity Assessment Lower Extremity Assessment: Defer to PT evaluation       Communication Communication Communication: No difficulties   Cognition Arousal/Alertness: Awake/alert Behavior During  Therapy: WFL for tasks assessed/performed Overall Cognitive Status: Within Functional Limits for tasks assessed                                                Home Living Family/patient expects to be discharged to:: Private residence Living Arrangements: Alone Available Help at Discharge: Family;Available PRN/intermittently Type of Home: House Home Access: Stairs to enter [employer] of Steps: 4 Entrance Stairs-Rails: Right Home Layout: Able to live on main level with bedroom/bathroom;Laundry or work area in basement;Other (Comment)(8 steps to lower level)     Bathroom Shower/Tub: Producer, television/film/video: Standard     Home Equipment: Environmental consultant - 2 wheels;Cane - single point;Shower seat   Additional Comments: bathroom is close to bedroom      Prior Functioning/Environment Level of Independence: Independent with assistive device(s)        Comments: typically uses cane, mostly been staying at home. sister has been doing grocery shopping, patient does not drive        OT Problem List: Decreased activity tolerance;Impaired balance (sitting and/or standing);Decreased safety awareness;Cardiopulmonary status limiting activity      OT Treatment/Interventions: Self-care/ADL training;Therapeutic exercise;Energy conservation;DME and/or AE instruction;Therapeutic activities;Patient/family education;Balance training    OT Goals(Current goals can be found in the care plan section) Acute Rehab OT Goals Patient Stated Goal: to feel better OT Goal Formulation: With patient Time For Goal Achievement: 06/02/19 Potential to Achieve Goals: Good  OT Frequency: Min 2X/week    AM-PAC OT "6 Clicks" Daily Activity     Outcome Measure Help from another person eating meals?: None Help from another person taking care of personal grooming?: A Little Help from another person toileting, which includes using toliet, bedpan, or urinal?: A Little Help from another  person bathing (including washing, rinsing, drying)?: A Little Help from another person to put on and taking off regular upper body clothing?: A Little Help from another person to put on and taking off regular lower body clothing?: A Little 6 Click Score: 19   End of Session Equipment Utilized During Treatment: Oxygen;Rolling walker  Activity Tolerance: Patient tolerated treatment well Patient left: in chair;with call bell/phone within reach  OT Visit Diagnosis: Other abnormalities of gait and mobility (R26.89)                Time: 3559-7416 OT Time Calculation (min): 24 min Charges:  OT General Charges $OT Visit: 1 Visit OT Evaluation $OT Eval Moderate Complexity: 1 Mod OT Treatments $Self Care/Home Management : 8-22 mins  Myrtie Neither OT OT office: [phone]  Carmelia Roller 05/19/2019, 2:00 PM

## 2019-05-19 NOTE — Evaluation (Signed)
Clinical/Bedside Swallow Evaluation Patient Details  Name: Gabrielle [name] MRN: [id number] Date of Birth: Jan 10, 1946  Today's Date: 05/19/2019 Time: SLP Start Time (ACUTE ONLY): 1535 SLP Stop Time (ACUTE ONLY): 1615 SLP Time Calculation (min) (ACUTE ONLY): 40 min  Past Medical History:  Past Medical History:  Diagnosis Date  . Anemia   . Arthritis   . Asthma   . Atrial fibrillation with controlled ventricular response (HCC) 11/2009   a. not on anticoagulation due to recurrent GI bleeding while on Eliquis.  . Chronic bronchitis   . Chronic diastolic CHF (congestive heart failure) (HCC)    a. Echo 12/2015: EF 55-60% w/ Grade 3 DD  . Chronic headache   . COPD (chronic obstructive pulmonary disease) (HCC)   . Depression   . Heart attack (HCC)   . Hyperlipidemia   . Hypertension    Past Surgical History:  Past Surgical History:  Procedure Laterality Date  . CAD-CABG     x7, brief post-op Atrial Fib  . CESAREAN SECTION     x2  . COLONOSCOPY WITH PROPOFOL N/A 10/24/2015   Procedure: COLONOSCOPY WITH PROPOFOL;  Surgeon: Carman Ching, MD;  Location: [facility] ENDOSCOPY;  Service: Endoscopy;  Laterality: N/A;  . CORONARY ARTERY BYPASS GRAFT  2011  . EP IMPLANTABLE DEVICE N/A 12/26/2015   Procedure: Pacemaker Implant;  Surgeon: Marinus Maw, MD;  Location: [facility] INVASIVE CV LAB;  Service: Cardiovascular;  Laterality: N/A;  . ESOPHAGOGASTRODUODENOSCOPY N/A 10/22/2015   Procedure: ESOPHAGOGASTRODUODENOSCOPY (EGD);  Surgeon: Carman Ching, MD;  Location: [facility] ENDOSCOPY;  Service: Endoscopy;  Laterality: N/A;  . ESOPHAGOGASTRODUODENOSCOPY N/A 12/24/2015   Procedure: ESOPHAGOGASTRODUODENOSCOPY (EGD);  Surgeon: Carman Ching, MD;  Location: [facility]COPY;  Service: Endoscopy;  Laterality: N/A;  . JOINT REPLACEMENT Bilateral   . TONSILLECTOMY    . TOTAL KNEE ARTHROPLASTY     HPI:  74 yo female adm to [facility] with acute CHF.  Pt with PMH + for COPD, esophageal ulcers 2017, dysphagia diagnosed in 2017,  nicotine addiction, ? dementia, anemia.    CXR with trace left effusion, ? atelectasis or bronchitis.  Pt CT in Dec 2020 concerning for possible multifocal pna.   Assessment / Plan / Recommendation Clinical Impression  Pt presents with functional oropharyngeal swallow ability based on clinical swallow evaluation.  Pt has h/o reflux disease and ulcers per endosocopy 2017 and takes a PPI and carafate.  SLP adiministere RSI with her scoring 24/45 indicative of LPR.  Educated pt to swallow precautions given her respriatory disease, reflux and ulceration hx and lack of dentition.  Advised her to rescore her self monthly on RIS with goal to score below 10.  Also advisd she follw up with Dr Randa Evens as OP given her was the GI MD who conducted endoscopy.  No s/s of aspiration with po observed and pt with appropriate respiratory/swallow reciprocity.  She does report becoming dyspenic with po intake however and SLP advised strict precaution including ceasing po intake if short of breath.  Pt states she will choke on occasion but when is occurs it is severe.  She also pointed to distal esophagus to indicate region of deficits - SLP questions if she could have some dysmotility given years of difficulties.    All education completed with written instructions and teach back. No follow up needed from this SLP.      Aspiration Risk  Mild aspiration risk    Diet Recommendation    Regular/thin diet Liquid Administration via: Cup;Straw Medication Administration: Whole meds with  liquid Supervision: Patient able to self feed Compensations: Minimize environmental distractions;Slow rate;Small sips/bites Postural Changes: Seated upright at 90 degrees;Remain upright for at least 30 minutes after po intake    Other  Recommendations Recommended Consults: (GI follow up as OP  Dr Oletta Lamas has seen pt in the past) Oral Care Recommendations: Oral care BID   Follow up Recommendations None      Frequency and Duration             Prognosis        Swallow Study   General Date of Onset: 05/19/19 HPI: 74 yo female adm to [facility] with acute CHF.  Pt with PMH + for COPD, esophageal ulcers 2017, dysphagia diagnosed in 2017, nicotine addiction, ? dementia, anemia.    CXR with trace left effusion, ? atelectasis or bronchitis.  Pt CT in Dec 2020 concerning for possible multifocal pna. Type of Study: Bedside Swallow Evaluation Previous Swallow Assessment: see hpi Diet Prior to this Study: Regular;Thin liquids Temperature Spikes Noted: No Respiratory Status: Room air History of Recent Intubation: No Behavior/Cognition: Alert;Cooperative;Pleasant mood Oral Cavity Assessment: Within Functional Limits Oral Care Completed by SLP: No Oral Cavity - Dentition: Edentulous Vision: Functional for self-feeding Self-Feeding Abilities: Able to feed self Patient Positioning: Upright in bed Baseline Vocal Quality: Low vocal intensity Volitional Cough: Strong Volitional Swallow: Able to elicit    Oral/Motor/Sensory Function Overall Oral Motor/Sensory Function: Within functional limits   Ice Chips Ice chips: Not tested   Thin Liquid Thin Liquid: Within functional limits Presentation: Cup;Straw    Nectar Thick Nectar Thick Liquid: Not tested   Honey Thick Honey Thick Liquid: Not tested   Puree Puree: Within functional limits Presentation: Self Fed;Spoon   Solid     Solid: Within functional limits Presentation: Self Fed Other Comments: sneezing with intake of solids      Macario Golds 05/19/2019,4:38 PM   Kathleen Lime, MS [place] Office [phone]

## 2019-05-19 NOTE — Progress Notes (Signed)
PROGRESS NOTE  Gabrielle [name] TFT:[id number] DOB: May 05, 1945 DOA: 05/16/2019 PCP: Henrine Screws, MD  HPI/Recap of past 24 hours: Gabrielle [name] is a 74 y.o. female with medical history significant of asthma, chronic atrial fibrillation, chronic diastolic CHF, COPD, coronary artery disease, hypertension and hyperlipidemia who was sent over by PCP secondary to progressive shortness of breath and cough for the last 3 weeks.  Associated with orthopnea and PND.    Worse with ambulation.  She had an echo back in December showing EF of 55 to 60%.  In the ED BNP greater than 1300.  Chest x-ray suggestive of pulmonary edema.  TRH asked to admit.  Started on IV diuretic.  Ongoing diuresing with IV Lasix.    Self reports personal history of dysphagia.  Speech consulted for swallow eval.  05/19/19:  Seen and examined.  Reports persistent dyspnea despite diuresing.  Follows with Dr.  Anne Fu outpatient.  Seen by cardiology with further recommendations.  Will repeat home O2 eval for dc planning.  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 Cardiology following  Acute hypoxic respiratory failure likely secondary to pulmonary edema in the setting of acute on chronic diastolic CHF Not on oxygen supplementation at  baseline Continue to maintain O2 saturation greater 92% Obtain home O2 evaluation for DC planning. Use incentive spirometer to minimize fluid buildup in the lungs.  Possible dysphagia Coughing after eating this morning Speech therapy to assess Aspiration precautions  Chronic A. Fib Rate controlled on p.o. diltiazem and metoprolol Not on DOAC due to significant history of GI bleed  History of asthma Continue home medications  Chronic anxiety/depression Continue home medications  GERD Continue home medications  Hyperlipidemia Continue home statin  Coronary artery disease Denies any anginal symptom Continue home medications Continue to monitor on telemetry  Essential hypertension Continue home medications Continue to monitor vital signs Blood pressure is at goal.  DVT prophylaxis: Lovenox subcu daily. Code Status: Full Family Communication:  Will call family if okay with the patient.  Disposition Plan:  Patient is from home.  Anticipate discharge to home in the next 24 hours once cardiology signs off.  Barrier to discharge: Ongoing diuresing for acute on chronic diastolic CHF.  Consults called:  Cardiology      Objective: Vitals:   05/18/19 2039 05/18/19 2047 05/19/19 0444 05/19/19 1319  BP:  (!) 151/79 (!) 142/83 114/87  Pulse:  73 70 68  Resp:  18 20   Temp:  98.4 F (36.9 C) 98.3 F (36.8 C) 98.9 F (37.2 C)  TempSrc:  Oral Oral Oral  SpO2: 97% 96% 99% 93%  Weight:   92.4 kg   Height:        Intake/Output Summary (Last 24 hours) at 05/19/2019 1426 Last data filed at 05/19/2019 1408 Gross per 24 hour  Intake 1060 ml  Output 1900 ml  Net -840 ml   Filed Weights   05/17/19 0110 05/18/19 0432 05/19/19 0444  Weight: 93.6 kg 93 kg 92.4 kg    Exam:  . General: 74 y.o. year-old female obese in no acute distress.  Alert and oriented x3.   . Cardiovascular: Irregular rate and rhythm no rubs or gallops. Marland Kitchen Respiratory: Clear to auscultation no wheezes  noted.  Good respiratory effort.  .  Abdomen: Obese nontender normal bowel sounds present. . Musculoskeletal: Trace lower extremity edema bilaterally.   Marland Kitchen Psychiatry: Mood is appropriate for condition and setting.   Data Reviewed: CBC: Recent Labs  Lab 05/16/19 1840  WBC 7.8  NEUTROABS 6.9  HGB 10.4*  HCT 35.7*  MCV 93.0  PLT 272   Basic Metabolic Panel: Recent Labs  Lab 05/16/19 1840 05/17/19 0327 05/18/19 0420 05/19/19 0419  NA 141 142 143 143  K 3.8 3.1* 3.6 4.1  CL 103 101 103 101  CO2 27 29 32 30  GLUCOSE 165* 180* 121* 158*  BUN 19 17 15 17   CREATININE 1.19* 1.20* 1.11* 1.29*  CALCIUM 9.4 9.4 9.1 9.7  MG  --  2.0  --   --    GFR: Estimated Creatinine Clearance: 40.3 mL/min (A) (by C-G formula based on SCr of 1.29 mg/dL (H)). Liver Function Tests: Recent Labs  Lab 05/16/19 1840  AST 17  ALT 8  ALKPHOS 54  BILITOT 1.2  PROT 7.1  ALBUMIN 4.1   No results for input(s): LIPASE, AMYLASE in the last 168 hours. No results for input(s): AMMONIA in the last 168 hours. Coagulation Profile: No results for input(s): INR, PROTIME in the last 168 hours. Cardiac Enzymes: No results for input(s): CKTOTAL, CKMB, CKMBINDEX, TROPONINI in the last 168 hours. BNP (last 3 results) No results for input(s): PROBNP in the last 8760 hours. HbA1C: No results for input(s): HGBA1C in the last 72 hours. CBG: No results for input(s): GLUCAP in the last 168 hours. Lipid Profile: No results for input(s): CHOL, HDL, LDLCALC, TRIG, CHOLHDL, LDLDIRECT in the last 72 hours. Thyroid Function Tests: No results for input(s): TSH, T4TOTAL, FREET4, T3FREE, THYROIDAB in the last 72 hours. Anemia Panel: No results for input(s): VITAMINB12, FOLATE, FERRITIN, TIBC, IRON, RETICCTPCT in the last 72 hours. Urine analysis:    Component Value Date/Time   COLORURINE YELLOW 02/21/2019 1810   APPEARANCEUR CLEAR 02/21/2019 1810   LABSPEC 1.018 02/21/2019 1810   PHURINE 6.0 02/21/2019 1810    GLUCOSEU NEGATIVE 02/21/2019 1810   HGBUR NEGATIVE 02/21/2019 1810   BILIRUBINUR NEGATIVE 02/21/2019 1810   KETONESUR NEGATIVE 02/21/2019 1810   PROTEINUR 30 (A) 02/21/2019 1810   UROBILINOGEN 1.0 12/02/2010 1023   NITRITE NEGATIVE 02/21/2019 1810   LEUKOCYTESUR NEGATIVE 02/21/2019 1810   Sepsis Labs: @LABRCNTIP (procalcitonin:4,lacticidven:4)  ) Recent Results (from the past 240 hour(s))  Respiratory Panel by RT PCR (Flu A&B, Covid) - Nasopharyngeal Swab     Status: None   Collection Time: 05/16/19  8:05 PM   Specimen: Nasopharyngeal Swab  Result Value Ref Range Status   SARS Coronavirus 2 by RT PCR NEGATIVE NEGATIVE Final    Comment: (NOTE) SARS-CoV-2 target nucleic acids are NOT DETECTED. The SARS-CoV-2 RNA is generally detectable in upper respiratoy specimens during the acute phase of infection. The lowest concentration of SARS-CoV-2 viral copies this assay can detect is 131 copies/mL. A negative result does not preclude SARS-Cov-2 infection and should not be used as the sole basis for treatment or other patient management decisions. A negative result  may occur with  improper specimen collection/handling, submission of specimen other than nasopharyngeal swab, presence of viral mutation(s) within the areas targeted by this assay, and inadequate number of viral copies (<131 copies/mL). A negative result must be combined with clinical observations, patient history, and epidemiological information. The expected result is Negative. Fact Sheet for Patients:  https://www.moore.com/ Fact Sheet for Healthcare Providers:  https://www.young.biz/ This test is not yet ap proved or cleared by the [place] FDA and  has been authorized for detection and/or diagnosis of SARS-CoV-2 by FDA under an Emergency Use Authorization (EUA). This EUA will remain  in effect (meaning this test can be used) for the duration of the COVID-19 declaration under  Section 564(b)(1) of the Act, 21 U.S.C. section 360bbb-3(b)(1), unless the authorization is terminated or revoked sooner.    Influenza A by PCR NEGATIVE NEGATIVE Final   Influenza B by PCR NEGATIVE NEGATIVE Final    Comment: (NOTE) The Xpert Xpress SARS-CoV-2/FLU/RSV assay is intended as an aid in  the diagnosis of influenza from Nasopharyngeal swab specimens and  should not be used as a sole basis for treatment. Nasal washings and  aspirates are unacceptable for Xpert Xpress SARS-CoV-2/FLU/RSV  testing. Fact Sheet for Patients: https://www.moore.com/ Fact Sheet for Healthcare Providers: https://www.young.biz/ This test is not yet approved or cleared by the [place] FDA and  has been authorized for detection and/or diagnosis of SARS-CoV-2 by  FDA under an Emergency Use Authorization (EUA). This EUA will remain  in effect (meaning this test can be used) for the duration of the  Covid-19 declaration under Section 564(b)(1) of the Act, 21  U.S.C. section 360bbb-3(b)(1), unless the authorization is  terminated or revoked. Performed at [facility], [address] [place], [place] [address]       Studies: No results found.  Scheduled Meds: . acetaminophen  650 mg Oral BID  . ALPRAZolam  1 mg Oral QHS  . atorvastatin  20 mg Oral q1800  . citalopram  20 mg Oral Daily  . clobetasol cream  1 application Topical BID  . diclofenac sodium  4 g Topical QID  . diltiazem  120 mg Oral Daily  . enoxaparin (LOVENOX) injection  40 mg Subcutaneous Q24H  . fluticasone  2 spray Each Nare Daily  . furosemide  80 mg Intravenous BID  . Melatonin  10 mg Oral QHS  . metoprolol tartrate  75 mg Oral BID  . mometasone-formoterol  2 puff Inhalation BID  . pantoprazole  40 mg Oral Daily  . potassium chloride SA  40 mEq Oral Daily  . sodium chloride flush  3 mL Intravenous Q12H  . sucralfate  1 g Oral TID AC  . umeclidinium bromide  1  puff Inhalation Daily    Continuous Infusions: . sodium chloride 10 mL/hr (05/17/19 0158)  . sodium chloride       LOS: 3 days     Darlin Drop, MD [facility] Pager [phone]  If 7PM-7AM, please contact night-coverage [email or web address] Password [email or web address] 05/19/2019, 2:26 PM

## 2019-05-19 NOTE — Care Management Important Message (Signed)
Important Message  Patient Details IM Letter given to Windell Moulding SW Case Manager to present to the Patient Name: Gabrielle [name] MRN: [id number] Date of Birth: 06-24-1945   Medicare Important Message Given:  Yes     Caren Macadam 05/19/2019, 11:09 AM

## 2019-05-19 NOTE — Progress Notes (Signed)
Patient expressed feeling more poorly today than yesterday.  Day shift RN unable to ambulate patient to evaluate oxygen levels for discharge.

## 2019-05-20 LAB — BASIC METABOLIC PANEL
Anion gap: 14 (ref 5–15)
BUN: 13 mg/dL (ref 8–23)
CO2: 32 mmol/L (ref 22–32)
Calcium: 9.9 mg/dL (ref 8.9–10.3)
Chloride: 94 mmol/L — ABNORMAL LOW (ref 98–111)
Creatinine, Ser: 1.03 mg/dL — ABNORMAL HIGH (ref 0.44–1.00)
GFR calc Af Amer: >60 mL/min (ref >60)
GFR calc non Af Amer: 53 mL/min — ABNORMAL LOW (ref >60)
Glucose, Bld: 194 mg/dL — ABNORMAL HIGH (ref 70–99)
Potassium: 4 mmol/L (ref 3.5–5.1)
Sodium: 140 mmol/L (ref 135–145)

## 2019-05-20 LAB — HEMOGLOBIN A1C
Hgb A1c MFr Bld: 6.4 % — ABNORMAL HIGH (ref 4.8–5.6)
Mean Plasma Glucose: 136.98 mg/dL

## 2019-05-20 LAB — GLUCOSE, CAPILLARY
Glucose-Capillary: 215 mg/dL — ABNORMAL HIGH (ref 70–99)
Glucose-Capillary: 168 mg/dL — ABNORMAL HIGH (ref 70–99)

## 2019-05-20 MED ORDER — FUROSEMIDE 10 MG/ML IJ SOLN: 80.0000 mg | Freq: Two times a day (BID) | INTRAMUSCULAR | Status: DC

## 2019-05-20 MED ORDER — INSULIN ASPART 100 UNIT/ML ~~LOC~~ SOLN
0.0000 [IU] | Freq: Every day | SUBCUTANEOUS | Status: DC
  Administered 2019-05-21 – 2019-05-24 (×2): 2 [IU] via SUBCUTANEOUS

## 2019-05-20 MED ORDER — INSULIN ASPART 100 UNIT/ML ~~LOC~~ SOLN
0.0000 [IU] | Freq: Three times a day (TID) | SUBCUTANEOUS | Status: DC
  Administered 2019-05-20: 3 [IU] via SUBCUTANEOUS
  Administered 2019-05-22: 2 [IU] via SUBCUTANEOUS
  Administered 2019-05-22 (×2): 1 [IU] via SUBCUTANEOUS
  Administered 2019-05-23 (×2): 2 [IU] via SUBCUTANEOUS
  Administered 2019-05-23: 1 [IU] via SUBCUTANEOUS
  Administered 2019-05-24: 2 [IU] via SUBCUTANEOUS
  Administered 2019-05-24: 1 [IU] via SUBCUTANEOUS
  Administered 2019-05-24 – 2019-05-25 (×2): 2 [IU] via SUBCUTANEOUS
  Administered 2019-05-25 – 2019-05-26 (×3): 1 [IU] via SUBCUTANEOUS
  Administered 2019-05-26: 5 [IU] via SUBCUTANEOUS

## 2019-05-20 MED ORDER — FUROSEMIDE 10 MG/ML IJ SOLN
80.0000 mg | Freq: Two times a day (BID) | INTRAMUSCULAR | Status: DC
  Administered 2019-05-20: 80 mg via INTRAVENOUS
  Filled 2019-05-20 (×2): qty 8

## 2019-05-20 NOTE — Progress Notes (Signed)
PROGRESS NOTE  Gabrielle [name] UUV:[id number] DOB: January 22, 1946 DOA: 05/16/2019 PCP: Aura Dials, MD  HPI/Recap of past 24 hours: Gabrielle [name] is a 74 y.o. female with medical history significant of asthma, chronic atrial fibrillation, chronic diastolic CHF, COPD, coronary artery disease, hypertension and hyperlipidemia who was sent over by PCP secondary to progressive shortness of breath and cough for the last 3 weeks.  Associated with orthopnea and PND.    Worse with ambulation.  She had an echo back in December showing EF of 55 to 60%.  In the ED, BNP greater than 1300.  Chest x-ray suggestive of pulmonary edema.  Likely AoC dCHF.  TRH asked to admit.  Started on IV diuretic.  Ongoing diuresing.  Seen by cardio.  Increased IV lasix to 80 mg BID due to volume overload.   Self reported personal history of dysphagia.  Seen by Speech mild aspiration risk.  Rec reg/thin diet.  05/20/19:  Seen and examined.  Somnolent.  States she had a rough night.  Ongoing diuresing with IV lasix per cardiology. Net I&O -2.6L.   Assessment/Plan: Principal Problem:   Acute on chronic diastolic CHF (congestive heart failure) /EF 55 % Active Problems:   Hyperlipidemia   Depression   Hypertensive heart disease with CHF (congestive heart failure) (HCC)   Atrial fibrillation with controlled ventricular response (HCC)   Coronary artery disease due to lipid rich plaque   Obesity  Acute on chronic dCHF Presented with BNP >1300 with CXR suggestive of pulmonary edema Volume overload on exam Seen by cardiology.  Ongoing diuresing.  IV Lasix increased to 80 mg twice daily. Net I&O -2.6 L Last 2D echo done on December 2020 showed normal LVEF 50 to 55% with grade 2 diastolic dysfunction Continue strict I's and O's and daily weight. Continue to monitor electrolytes with goal magnesium 2.0 and potassium 4.0. Cardiology following.  Appreciate assistance.  Acute hypoxic respiratory failure likely secondary  to pulmonary edema in the setting of acute on chronic diastolic CHF Not on oxygen supplementation at baseline Continue to maintain O2 saturation greater 92% Continue use incentive spirometer to minimize fluid buildup in the lungs.  Hyperglycemia Serum glucose has been consistently elevated Will obtain hemoglobin A1c Start insulin sliding scale  Obesity BMI 35 Recommend weight loss outpatient with regular physical activity and healthy diet  Chronic A. Fib Rate controlled on p.o. diltiazem and metoprolol Not on DOAC due to significant history of GI bleed  History of asthma Continue home medications  Chronic anxiety/depression Continue home medications  GERD Continue home medications  Hyperlipidemia Continue home statin  Coronary artery disease Denies any anginal symptom Continue home medications Continue to monitor on telemetry  Essential hypertension Blood pressure is currently at goal Continue to closely monitor blood pressure on IV diuretics   DVT prophylaxis: Lovenox subcu daily. Code Status: Full Family Communication:  Will call family if okay with the patient.  Disposition Plan:  Patient is from home.  Anticipate discharge to home in the next 24 hours once cardiology signs off.  Barrier to discharge: Ongoing diuresing in the setting of acute on chronic diastolic CHF.  Consults called:  Cardiology      Objective: Vitals:   05/19/19 1942 05/19/19 2249 05/20/19 0400 05/20/19 1326  BP:  (!) 169/108 (!) 145/91 122/81  Pulse:  97 (!) 101 64  Resp:      Temp:   98.1 F (36.7 C) 98.7 F (37.1 C)  TempSrc:   Oral Axillary  SpO2:  94%  94% 95%  Weight:   88.7 kg   Height:        Intake/Output Summary (Last 24 hours) at 05/20/2019 1401 Last data filed at 05/20/2019 1326 Gross per 24 hour  Intake --  Output 2050 ml  Net -2050 ml   Filed Weights   05/18/19 0432 05/19/19 0444 05/20/19 0400  Weight: 93 kg 92.4 kg 88.7 kg    Exam:  . General: 74 y.o.  year-old female obese in no acute distress.  Somnolent but easily arousable.  .  Cardiovascular: irregular rate and rhythm no rubs or gallops.   Marland Kitchen Respiratory: Clear auscultation no wheezes or rales. .  Abdomen: Obese nontender normal bowel sounds present.   . Musculoskeletal: Trace lower extremity edema bilaterally.   Marland Kitchen Psychiatry: Mood is appropriate for condition and setting.   Data Reviewed: CBC: Recent Labs  Lab 05/16/19 1840  WBC 7.8  NEUTROABS 6.9  HGB 10.4*  HCT 35.7*  MCV 93.0  PLT 178   Basic Metabolic Panel: Recent Labs  Lab 05/16/19 1840 05/17/19 0327 05/18/19 0420 05/19/19 0419 05/20/19 0321  NA 141 142 143 143 140  K 3.8 3.1* 3.6 4.1 4.0  CL 103 101 103 101 94*  CO2 27 29 32 30 32  GLUCOSE 165* 180* 121* 158* 194*  BUN 19 17 15 17 13   CREATININE 1.19* 1.20* 1.11* 1.29* 1.03*  CALCIUM 9.4 9.4 9.1 9.7 9.9  MG  --  2.0  --   --   --    GFR: Estimated Creatinine Clearance: 49.4 mL/min (A) (by C-G formula based on SCr of 1.03 mg/dL (H)). Liver Function Tests: Recent Labs  Lab 05/16/19 1840  AST 17  ALT 8  ALKPHOS 54  BILITOT 1.2  PROT 7.1  ALBUMIN 4.1   No results for input(s): LIPASE, AMYLASE in the last 168 hours. No results for input(s): AMMONIA in the last 168 hours. Coagulation Profile: No results for input(s): INR, PROTIME in the last 168 hours. Cardiac Enzymes: No results for input(s): CKTOTAL, CKMB, CKMBINDEX, TROPONINI in the last 168 hours. BNP (last 3 results) No results for input(s): PROBNP in the last 8760 hours. HbA1C: No results for input(s): HGBA1C in the last 72 hours. CBG: No results for input(s): GLUCAP in the last 168 hours. Lipid Profile: No results for input(s): CHOL, HDL, LDLCALC, TRIG, CHOLHDL, LDLDIRECT in the last 72 hours. Thyroid Function Tests: No results for input(s): TSH, T4TOTAL, FREET4, T3FREE, THYROIDAB in the last 72 hours. Anemia Panel: No results for input(s): VITAMINB12, FOLATE, FERRITIN, TIBC, IRON,  RETICCTPCT in the last 72 hours. Urine analysis:    Component Value Date/Time   COLORURINE YELLOW 02/21/2019 1810   APPEARANCEUR CLEAR 02/21/2019 1810   LABSPEC 1.018 02/21/2019 1810   PHURINE 6.0 02/21/2019 1810   GLUCOSEU NEGATIVE 02/21/2019 1810   HGBUR NEGATIVE 02/21/2019 1810   BILIRUBINUR NEGATIVE 02/21/2019 1810   KETONESUR NEGATIVE 02/21/2019 1810   PROTEINUR 30 (A) 02/21/2019 1810   UROBILINOGEN 1.0 12/02/2010 1023   NITRITE NEGATIVE 02/21/2019 1810   LEUKOCYTESUR NEGATIVE 02/21/2019 1810   Sepsis Labs: @LABRCNTIP (procalcitonin:4,lacticidven:4)  ) Recent Results (from the past 240 hour(s))  Respiratory Panel by RT PCR (Flu A&B, Covid) - Nasopharyngeal Swab     Status: None   Collection Time: 05/16/19  8:05 PM   Specimen: Nasopharyngeal Swab  Result Value Ref Range Status   SARS Coronavirus 2 by RT PCR NEGATIVE NEGATIVE Final    Comment: (NOTE) SARS-CoV-2 target nucleic acids are NOT  DETECTED. The SARS-CoV-2 RNA is generally detectable in upper respiratoy specimens during the acute phase of infection. The lowest concentration of SARS-CoV-2 viral copies this assay can detect is 131 copies/mL. A negative result does not preclude SARS-Cov-2 infection and should not be used as the sole basis for treatment or other patient management decisions. A negative result may occur with  improper specimen collection/handling, submission of specimen other than nasopharyngeal swab, presence of viral mutation(s) within the areas targeted by this assay, and inadequate number of viral copies (<131 copies/mL). A negative result must be combined with clinical observations, patient history, and epidemiological information. The expected result is Negative. Fact Sheet for Patients:  https://www.moore.com/ Fact Sheet for Healthcare Providers:  https://www.young.biz/ This test is not yet ap proved or cleared by the [place] FDA and  has been  authorized for detection and/or diagnosis of SARS-CoV-2 by FDA under an Emergency Use Authorization (EUA). This EUA will remain  in effect (meaning this test can be used) for the duration of the COVID-19 declaration under Section 564(b)(1) of the Act, 21 U.S.C. section 360bbb-3(b)(1), unless the authorization is terminated or revoked sooner.    Influenza A by PCR NEGATIVE NEGATIVE Final   Influenza B by PCR NEGATIVE NEGATIVE Final    Comment: (NOTE) The Xpert Xpress SARS-CoV-2/FLU/RSV assay is intended as an aid in  the diagnosis of influenza from Nasopharyngeal swab specimens and  should not be used as a sole basis for treatment. Nasal washings and  aspirates are unacceptable for Xpert Xpress SARS-CoV-2/FLU/RSV  testing. Fact Sheet for Patients: https://www.moore.com/ Fact Sheet for Healthcare Providers: https://www.young.biz/ This test is not yet approved or cleared by the [place] FDA and  has been authorized for detection and/or diagnosis of SARS-CoV-2 by  FDA under an Emergency Use Authorization (EUA). This EUA will remain  in effect (meaning this test can be used) for the duration of the  Covid-19 declaration under Section 564(b)(1) of the Act, 21  U.S.C. section 360bbb-3(b)(1), unless the authorization is  terminated or revoked. Performed at [facility], [address] [place], [place] [address]       Studies: No results found.  Scheduled Meds: . acetaminophen  650 mg Oral BID  . ALPRAZolam  1 mg Oral QHS  . atorvastatin  20 mg Oral q1800  . citalopram  20 mg Oral Daily  . clobetasol cream  1 application Topical BID  . diclofenac sodium  4 g Topical QID  . diltiazem  120 mg Oral Daily  . enoxaparin (LOVENOX) injection  40 mg Subcutaneous Q24H  . fluticasone  2 spray Each Nare Daily  . Melatonin  10 mg Oral QHS  . metoprolol tartrate  75 mg Oral BID  . mometasone-formoterol  2 puff Inhalation BID  .  pantoprazole  40 mg Oral Daily  . potassium chloride SA  40 mEq Oral Daily  . sodium chloride flush  3 mL Intravenous Q12H  . sucralfate  1 g Oral TID AC  . umeclidinium bromide  1 puff Inhalation Daily    Continuous Infusions: . sodium chloride 10 mL/hr (05/17/19 0158)  . sodium chloride       LOS: 4 days     Darlin Drop, MD [facility] Pager [phone]  If 7PM-7AM, please contact night-coverage [email or web address] Password [email or web address] 05/20/2019, 2:01 PM

## 2019-05-20 NOTE — Progress Notes (Signed)
Progress Note  Patient Name: Gabrielle [name] Date of Encounter: 05/20/2019  Primary Cardiologist: Candee Furbish, MD   Subjective   Sleeping fairly comfortably in bed.  Arousable.  Pleasant.  Nodded back off to sleep.  Inpatient Medications    Scheduled Meds: . acetaminophen  650 mg Oral BID  . ALPRAZolam  1 mg Oral QHS  . atorvastatin  20 mg Oral q1800  . citalopram  20 mg Oral Daily  . clobetasol cream  1 application Topical BID  . diclofenac sodium  4 g Topical QID  . diltiazem  120 mg Oral Daily  . enoxaparin (LOVENOX) injection  40 mg Subcutaneous Q24H  . fluticasone  2 spray Each Nare Daily  . furosemide  80 mg Intravenous BID  . Melatonin  10 mg Oral QHS  . metoprolol tartrate  75 mg Oral BID  . mometasone-formoterol  2 puff Inhalation BID  . pantoprazole  40 mg Oral Daily  . potassium chloride SA  40 mEq Oral Daily  . sodium chloride flush  3 mL Intravenous Q12H  . sucralfate  1 g Oral TID AC  . umeclidinium bromide  1 puff Inhalation Daily   Continuous Infusions: . sodium chloride 10 mL/hr (05/17/19 0158)  . sodium chloride     PRN Meds: sodium chloride, acetaminophen, albuterol, docusate sodium, guaiFENesin-dextromethorphan, ondansetron (ZOFRAN) IV, sodium chloride flush, tiZANidine   Vital Signs    Vitals:   05/19/19 1319 05/19/19 1942 05/19/19 2249 05/20/19 0400  BP: 114/87  (!) 169/108 (!) 145/91  Pulse: 68  97 (!) 101  Resp:      Temp: 98.9 F (37.2 C)   98.1 F (36.7 C)  TempSrc: Oral   Oral  SpO2: 93% 94%  94%  Weight:    88.7 kg  Height:        Intake/Output Summary (Last 24 hours) at 05/20/2019 0908 Last data filed at 05/20/2019 0354 Gross per 24 hour  Intake 660 ml  Output 2100 ml  Net -1440 ml   Last 3 Weights 05/20/2019 05/19/2019 05/18/2019  Weight (lbs) 195 lb 8 oz 203 lb 9.6 oz 205 lb 1.6 oz  Weight (kg) 88.678 kg 92.352 kg 93.033 kg      Telemetry    Atrial fibrillation overall rate controlled, paced beats while sleeping-  Personally Reviewed  ECG    05/16/2019-A. fib 97 PVCs nonspecific ST-T wave flattening- Personally Reviewed  Physical Exam   GEN: No acute distress.   Neck: No JVD Cardiac:  Irregularly irregular, no murmurs, rubs, or gallops.  Respiratory: Clear to auscultation bilaterally. GI: Soft, nontender, non-distended  MS: No edema; No deformity. Neuro:  Nonfocal  Psych: Normal affect   Labs    High Sensitivity Troponin:   Recent Labs  Lab 05/16/19 2008 05/17/19 0327  TROPONINIHS 6 9      Chemistry Recent Labs  Lab 05/16/19 1840 05/17/19 0327 05/18/19 0420 05/19/19 0419 05/20/19 0321  NA 141   < > 143 143 140  K 3.8   < > 3.6 4.1 4.0  CL 103   < > 103 101 94*  CO2 27   < > 32 30 32  GLUCOSE 165*   < > 121* 158* 194*  BUN 19   < > 15 17 13   CREATININE 1.19*   < > 1.11* 1.29* 1.03*  CALCIUM 9.4   < > 9.1 9.7 9.9  PROT 7.1  --   --   --   --   ALBUMIN  4.1  --   --   --   --   AST 17  --   --   --   --   ALT 8  --   --   --   --   ALKPHOS 54  --   --   --   --   BILITOT 1.2  --   --   --   --   GFRNONAA 45*   < > 49* 40* 53*  GFRAA 52*   < > 56* 47* >60  ANIONGAP 11   < > 8 12 14    < > = values in this interval not displayed.     Hematology Recent Labs  Lab 05/16/19 1840  WBC 7.8  RBC 3.84*  HGB 10.4*  HCT 35.7*  MCV 93.0  MCH 27.1  MCHC 29.1*  RDW 18.2*  PLT 178    BNP Recent Labs  Lab 05/16/19 1840  BNP 1,338.5*     DDimer No results for input(s): DDIMER in the last 168 hours.   Radiology    No results found.  Cardiac Studies   EF 60%  Patient Profile     74 y.o. female with coronary disease status post CABG in 2010 with permanent atrial fibrillation chronic diastolic heart failure, no anticoagulation secondary to bleeding episodes, pacemaker secondary to sick sinus syndrome, COPD on home oxygen with ongoing tobacco use here with progressive shortness of breath and cough.  Assessment & Plan    Progressive shortness of breath compatible  with acute on chronic diastolic heart failure as well as COPD -Okay to continue with Lasix 80 mg twice a day, continue to monitor creatinine closely and maintain potassium greater than 4 magnesium greater than 2. -Continuing with metoprolol. -BNP was greater than 1300 originally and chest x-ray was compatible with pulmonary edema. --1.4 L out yesterday, weight 92.4 on arrival 88.7 this morning -Creatinine decreasing with diuresis.  Permanent atrial fibrillation -Rates were recently difficult to control after device check on 04/2018 and this was improved with increasing metoprolol to 75 twice daily.  Her heart rates currently are little bit higher after missing a dose of metoprolol secondary to nausea and vomiting previously. -No anticoagulation secondary to recurrent GI bleeding. -On diltiazem and metoprolol both for rate control.  Essential hypertension -Somewhat labile but stable.  Diltiazem and metoprolol are being utilized.  Hyperlipidemia -Continue with atorvastatin.  Lipid previously drawn by primary.  COPD Home O2 -Likely contributing as well to her shortness of breath.  Tobacco use -Continue to encourage ongoing cessation.  Complex medical decision making.     For questions or updates, please contact [facility] Please consult [email or web address] for contact info under        Signed, 05/2018, MD  05/20/2019, 9:08 AM

## 2019-05-21 DIAGNOSIS — J9601 Acute respiratory failure with hypoxia: Secondary | ICD-10-CM

## 2019-05-21 LAB — GLUCOSE, CAPILLARY
Glucose-Capillary: 109 mg/dL — ABNORMAL HIGH (ref 70–99)
Glucose-Capillary: 118 mg/dL — ABNORMAL HIGH (ref 70–99)
Glucose-Capillary: 185 mg/dL — ABNORMAL HIGH (ref 70–99)
Glucose-Capillary: 238 mg/dL — ABNORMAL HIGH (ref 70–99)

## 2019-05-21 LAB — BASIC METABOLIC PANEL
Anion gap: 10 (ref 5–15)
BUN: 23 mg/dL (ref 8–23)
CO2: 33 mmol/L — ABNORMAL HIGH (ref 22–32)
Calcium: 9.4 mg/dL (ref 8.9–10.3)
Chloride: 96 mmol/L — ABNORMAL LOW (ref 98–111)
Creatinine, Ser: 1.84 mg/dL — ABNORMAL HIGH (ref 0.44–1.00)
GFR calc Af Amer: 31 mL/min — ABNORMAL LOW (ref >60)
GFR calc non Af Amer: 26 mL/min — ABNORMAL LOW (ref >60)
Glucose, Bld: 114 mg/dL — ABNORMAL HIGH (ref 70–99)
Potassium: 4.5 mmol/L (ref 3.5–5.1)
Sodium: 139 mmol/L (ref 135–145)

## 2019-05-21 MED ORDER — ALBUTEROL SULFATE (2.5 MG/3ML) 0.083% IN NEBU
2.5000 mg | INHALATION_SOLUTION | Freq: Three times a day (TID) | RESPIRATORY_TRACT | Status: DC
  Administered 2019-05-21 – 2019-05-22 (×3): 2.5 mg via RESPIRATORY_TRACT
  Filled 2019-05-21 (×5): qty 3

## 2019-05-21 MED ORDER — ENOXAPARIN SODIUM 30 MG/0.3ML ~~LOC~~ SOLN
30.0000 mg | SUBCUTANEOUS | Status: DC
  Administered 2019-05-22 – 2019-05-24 (×3): 30 mg via SUBCUTANEOUS
  Filled 2019-05-21 (×3): qty 0.3

## 2019-05-21 MED ORDER — IPRATROPIUM-ALBUTEROL 0.5-2.5 (3) MG/3ML IN SOLN
3.0000 mL | Freq: Four times a day (QID) | RESPIRATORY_TRACT | Status: DC
  Administered 2019-05-21: 3 mL via RESPIRATORY_TRACT
  Filled 2019-05-21: qty 3

## 2019-05-21 NOTE — Progress Notes (Signed)
PROGRESS NOTE  SHADEE RATHOD OIZ:[id number] DOB: [birth year] DOA: 05/16/2019 PCP: Henrine Screws, MD  HPI/Recap of past 24 hours: Gabrielle [name] is a 74 y.o. female with medical history significant of asthma, chronic atrial fibrillation, chronic diastolic CHF, COPD, coronary artery disease, hypertension and hyperlipidemia who was sent over by PCP secondary to progressive shortness of breath and cough for the last 3 weeks.  Associated with orthopnea and PND.    Worse with ambulation.  She had an echo back in December showing EF of 55 to 60%.  In the ED, BNP greater than 1300.  Chest x-ray suggestive of pulmonary edema.  Likely AoC dCHF.  TRH asked to admit.  Started on IV diuretic.  Ongoing diuresing.  Seen by cardio.  Increased IV lasix to 80 mg BID due to volume overload.   Self reported personal history of dysphagia.  Seen by Speech mild aspiration risk.  Rec reg/thin diet.  05/21/19:  Seen and examined.  Wheezing on exam.  Ordered  Nebs.  Creatinine trending up.  Will hold off IV lasix for now.  Defer to cardiology to restart diuretic.    Assessment/Plan: Principal Problem:   Acute on chronic diastolic CHF (congestive heart failure) /EF 55 % Active Problems:   Hyperlipidemia   Depression   Hypertensive heart disease with CHF (congestive heart failure) (HCC)   Atrial fibrillation with controlled ventricular response (HCC)   Coronary artery disease due to lipid rich plaque   Obesity  Acute on chronic dCHF Presented with BNP >1300 with CXR suggestive of pulmonary edema Volume overload on exam Seen by cardiology.  Diuresed. Net I&O -2.7 L Electrolytes at goal Last 2D echo done on December 2020 showed normal LVEF 50 to 55% with grade 2 diastolic dysfunction Continue strict I's and O's and daily weight.  AKI on CKD3A likely prerenal in the setting of intravascular volume depletion Baseline cr 1.0 with GFR 53 Cr up to 1.84 this AM Hold IV lasix, defer to cardiology to  restart Monitor UO Repeat BMP AM  Acute hypoxic respiratory failure likely secondary to pulmonary edema in the setting of acute on chronic diastolic CHF Not on oxygen supplementation at baseline Continue to maintain O2 saturation greater 92% Continue use incentive spirometer to minimize fluid buildup in the lungs.  Hyperglycemia Serum glucose has been consistently elevated Hemoglobin A1c 6.4 C/w insulin sliding scale  Obesity BMI 35 Recommend weight loss outpatient with regular physical activity and healthy diet  Chronic A. Fib Rate controlled on p.o. diltiazem and metoprolol Not on DOAC due to significant history of GI bleed  History of asthma Continue home medications Start duonebs q6H for wheezing  Chronic anxiety/depression Continue home medications  GERD Continue home medications  Hyperlipidemia Continue home statin  Coronary artery disease Denies any anginal symptom Continue home medications Continue to monitor on telemetry  Essential hypertension Blood pressure is currently at goal Continue to closely monitor blood pressure on IV diuretics   DVT prophylaxis: Lovenox subcu daily. Code Status: Full Family Communication:  Will call family if okay with the patient.  Disposition Plan:  Patient is from home.  Anticipate discharge to home in the next 24 hours once cardiology signs off.  Barrier to discharge: AKI.  Consults called:  Cardiology      Objective: Vitals:   05/20/19 2024 05/21/19 0432 05/21/19 0437 05/21/19 0927  BP: (!) 118/54 (!) 141/98  (!) 140/95  Pulse: 74 91  79  Resp: 18 20    Temp: 98.2  F (36.8 C) 98.6 F (37 C)    TempSrc: Oral Oral    SpO2: 94% 92%    Weight:   88.5 kg   Height:        Intake/Output Summary (Last 24 hours) at 05/21/2019 1660 Last data filed at 05/21/2019 6301 Gross per 24 hour  Intake 360 ml  Output 750 ml  Net -390 ml   Filed Weights   05/19/19 0444 05/20/19 0400 05/21/19 0437  Weight: 92.4 kg 88.7  kg 88.5 kg    Exam:  . General: 74 y.o. year-old female obese in no acute distress.  Alert and oriented x3.  .  Cardiovascular: Irregular rate and rhythm no rubs or gallops.   Marland Kitchen Respiratory: Diffuse wheezing bilaterally.  No rash noted. .  Abdomen: Obese nontender normal bowel sounds present. . Musculoskeletal: No lower extremity edema bilaterally.   Marland Kitchen Psychiatry: Mood is appropriate for condition and setting.   Data Reviewed: CBC: Recent Labs  Lab 05/16/19 1840  WBC 7.8  NEUTROABS 6.9  HGB 10.4*  HCT 35.7*  MCV 93.0  PLT 601   Basic Metabolic Panel: Recent Labs  Lab 05/17/19 0327 05/18/19 0420 05/19/19 0419 05/20/19 0321 05/21/19 0323  NA 142 143 143 140 139  K 3.1* 3.6 4.1 4.0 4.5  CL 101 103 101 94* 96*  CO2 29 32 30 32 33*  GLUCOSE 180* 121* 158* 194* 114*  BUN 17 15 17 13 23   CREATININE 1.20* 1.11* 1.29* 1.03* 1.84*  CALCIUM 9.4 9.1 9.7 9.9 9.4  MG 2.0  --   --   --   --    GFR: Estimated Creatinine Clearance: 27.6 mL/min (A) (by C-G formula based on SCr of 1.84 mg/dL (H)). Liver Function Tests: Recent Labs  Lab 05/16/19 1840  AST 17  ALT 8  ALKPHOS 54  BILITOT 1.2  PROT 7.1  ALBUMIN 4.1   No results for input(s): LIPASE, AMYLASE in the last 168 hours. No results for input(s): AMMONIA in the last 168 hours. Coagulation Profile: No results for input(s): INR, PROTIME in the last 168 hours. Cardiac Enzymes: No results for input(s): CKTOTAL, CKMB, CKMBINDEX, TROPONINI in the last 168 hours. BNP (last 3 results) No results for input(s): PROBNP in the last 8760 hours. HbA1C: Recent Labs    05/20/19 0318  HGBA1C 6.4*   CBG: Recent Labs  Lab 05/20/19 1715 05/20/19 2019 05/21/19 0750  GLUCAP 215* 168* 109*   Lipid Profile: No results for input(s): CHOL, HDL, LDLCALC, TRIG, CHOLHDL, LDLDIRECT in the last 72 hours. Thyroid Function Tests: No results for input(s): TSH, T4TOTAL, FREET4, T3FREE, THYROIDAB in the last 72 hours. Anemia Panel: No  results for input(s): VITAMINB12, FOLATE, FERRITIN, TIBC, IRON, RETICCTPCT in the last 72 hours. Urine analysis:    Component Value Date/Time   COLORURINE YELLOW 02/21/2019 1810   APPEARANCEUR CLEAR 02/21/2019 1810   LABSPEC 1.018 02/21/2019 1810   PHURINE 6.0 02/21/2019 1810   GLUCOSEU NEGATIVE 02/21/2019 1810   HGBUR NEGATIVE 02/21/2019 1810   BILIRUBINUR NEGATIVE 02/21/2019 1810   KETONESUR NEGATIVE 02/21/2019 1810   PROTEINUR 30 (A) 02/21/2019 1810   UROBILINOGEN 1.0 12/02/2010 1023   NITRITE NEGATIVE 02/21/2019 1810   LEUKOCYTESUR NEGATIVE 02/21/2019 1810   Sepsis Labs: @LABRCNTIP (procalcitonin:4,lacticidven:4)  ) Recent Results (from the past 240 hour(s))  Respiratory Panel by RT PCR (Flu A&B, Covid) - Nasopharyngeal Swab     Status: None   Collection Time: 05/16/19  8:05 PM   Specimen: Nasopharyngeal Swab  Result  Value Ref Range Status   SARS Coronavirus 2 by RT PCR NEGATIVE NEGATIVE Final    Comment: (NOTE) SARS-CoV-2 target nucleic acids are NOT DETECTED. The SARS-CoV-2 RNA is generally detectable in upper respiratoy specimens during the acute phase of infection. The lowest concentration of SARS-CoV-2 viral copies this assay can detect is 131 copies/mL. A negative result does not preclude SARS-Cov-2 infection and should not be used as the sole basis for treatment or other patient management decisions. A negative result may occur with  improper specimen collection/handling, submission of specimen other than nasopharyngeal swab, presence of viral mutation(s) within the areas targeted by this assay, and inadequate number of viral copies (<131 copies/mL). A negative result must be combined with clinical observations, patient history, and epidemiological information. The expected result is Negative. Fact Sheet for Patients:  https://www.moore.com/ Fact Sheet for Healthcare Providers:  https://www.young.biz/ This test is not yet ap  proved or cleared by the [place] FDA and  has been authorized for detection and/or diagnosis of SARS-CoV-2 by FDA under an Emergency Use Authorization (EUA). This EUA will remain  in effect (meaning this test can be used) for the duration of the COVID-19 declaration under Section 564(b)(1) of the Act, 21 U.S.C. section 360bbb-3(b)(1), unless the authorization is terminated or revoked sooner.    Influenza A by PCR NEGATIVE NEGATIVE Final   Influenza B by PCR NEGATIVE NEGATIVE Final    Comment: (NOTE) The Xpert Xpress SARS-CoV-2/FLU/RSV assay is intended as an aid in  the diagnosis of influenza from Nasopharyngeal swab specimens and  should not be used as a sole basis for treatment. Nasal washings and  aspirates are unacceptable for Xpert Xpress SARS-CoV-2/FLU/RSV  testing. Fact Sheet for Patients: https://www.moore.com/ Fact Sheet for Healthcare Providers: https://www.young.biz/ This test is not yet approved or cleared by the [place] FDA and  has been authorized for detection and/or diagnosis of SARS-CoV-2 by  FDA under an Emergency Use Authorization (EUA). This EUA will remain  in effect (meaning this test can be used) for the duration of the  Covid-19 declaration under Section 564(b)(1) of the Act, 21  U.S.C. section 360bbb-3(b)(1), unless the authorization is  terminated or revoked. Performed at [facility], [address], [place], [place] [address]       Studies: No results found.  Scheduled Meds: . acetaminophen  650 mg Oral BID  . ALPRAZolam  1 mg Oral QHS  . atorvastatin  20 mg Oral q1800  . citalopram  20 mg Oral Daily  . clobetasol cream  1 application Topical BID  . diclofenac sodium  4 g Topical QID  . diltiazem  120 mg Oral Daily  . enoxaparin (LOVENOX) injection  40 mg Subcutaneous Q24H  . fluticasone  2 spray Each Nare Daily  . insulin aspart  0-5 Units Subcutaneous QHS  . insulin aspart   0-9 Units Subcutaneous TID WC  . ipratropium-albuterol  3 mL Nebulization Q6H  . Melatonin  10 mg Oral QHS  . metoprolol tartrate  75 mg Oral BID  . mometasone-formoterol  2 puff Inhalation BID  . pantoprazole  40 mg Oral Daily  . potassium chloride SA  40 mEq Oral Daily  . sodium chloride flush  3 mL Intravenous Q12H  . sucralfate  1 g Oral TID AC  . umeclidinium bromide  1 puff Inhalation Daily    Continuous Infusions: . sodium chloride 10 mL/hr (05/17/19 0158)  . sodium chloride       LOS: 5 days  Darlin Drop, MD [facility] Pager [phone]  If 7PM-7AM, please contact night-coverage [email or web address] Password [facility] 05/21/2019, 9:28 AM

## 2019-05-21 NOTE — Progress Notes (Signed)
Progress Note  Patient Name: Gabrielle [name] Date of Encounter: 05/21/2019  Primary Cardiologist: Donato Schultz, MD   Subjective   No new complaints.  No chest pain.  Currently getting breathing treatment.  Breathing still not quite at baseline.  Inpatient Medications    Scheduled Meds: . acetaminophen  650 mg Oral BID  . ALPRAZolam  1 mg Oral QHS  . atorvastatin  20 mg Oral q1800  . citalopram  20 mg Oral Daily  . clobetasol cream  1 application Topical BID  . diclofenac sodium  4 g Topical QID  . diltiazem  120 mg Oral Daily  . enoxaparin (LOVENOX) injection  40 mg Subcutaneous Q24H  . fluticasone  2 spray Each Nare Daily  . insulin aspart  0-5 Units Subcutaneous QHS  . insulin aspart  0-9 Units Subcutaneous TID WC  . ipratropium-albuterol  3 mL Nebulization Q6H  . Melatonin  10 mg Oral QHS  . metoprolol tartrate  75 mg Oral BID  . mometasone-formoterol  2 puff Inhalation BID  . pantoprazole  40 mg Oral Daily  . potassium chloride SA  40 mEq Oral Daily  . sodium chloride flush  3 mL Intravenous Q12H  . sucralfate  1 g Oral TID AC  . umeclidinium bromide  1 puff Inhalation Daily   Continuous Infusions: . sodium chloride 10 mL/hr (05/17/19 0158)  . sodium chloride     PRN Meds: sodium chloride, acetaminophen, albuterol, docusate sodium, guaiFENesin-dextromethorphan, ondansetron (ZOFRAN) IV, sodium chloride flush, tiZANidine   Vital Signs    Vitals:   05/20/19 1953 05/20/19 2024 05/21/19 0432 05/21/19 0437  BP:  (!) 118/54 (!) 141/98   Pulse:  74 91   Resp:  18 20   Temp:  98.2 F (36.8 C) 98.6 F (37 C)   TempSrc:  Oral Oral   SpO2: 96% 94% 92%   Weight:    88.5 kg  Height:        Intake/Output Summary (Last 24 hours) at 05/21/2019 0927 Last data filed at 05/21/2019 0438 Gross per 24 hour  Intake 360 ml  Output 750 ml  Net -390 ml   Last 3 Weights 05/21/2019 05/20/2019 05/19/2019  Weight (lbs) 195 lb 1.7 oz 195 lb 8 oz 203 lb 9.6 oz  Weight (kg) 88.5  kg 88.678 kg 92.352 kg      Telemetry    Atrial fibrillation overall rate controlled, paced beats while sleeping- Personally Reviewed  ECG    05/16/2019-A. fib 97 PVCs nonspecific ST-T wave flattening- Personally Reviewed  Physical Exam   GEN: Well nourished, well developed, in no acute distress  HEENT: normal, hoarseness to her voice Neck: no JVD, carotid bruits, or masses Cardiac: irreg; no murmurs, rubs, or gallops,no edema  Respiratory:  clear to auscultation bilaterally, normal work of breathing GI: soft, nontender, nondistended, + BS MS: no deformity or atrophy  Skin: warm and dry, no rash Neuro:  Alert and Oriented x 3, Strength and sensation are intact Psych: euthymic mood, full affect   Labs    High Sensitivity Troponin:   Recent Labs  Lab 05/16/19 2008 05/17/19 0327  TROPONINIHS 6 9      Chemistry Recent Labs  Lab 05/16/19 1840 05/17/19 0327 05/19/19 0419 05/20/19 0321 05/21/19 0323  NA 141   < > 143 140 139  K 3.8   < > 4.1 4.0 4.5  CL 103   < > 101 94* 96*  CO2 27   < > 30 32  33*  GLUCOSE 165*   < > 158* 194* 114*  BUN 19   < > 17 13 23   CREATININE 1.19*   < > 1.29* 1.03* 1.84*  CALCIUM 9.4   < > 9.7 9.9 9.4  PROT 7.1  --   --   --   --   ALBUMIN 4.1  --   --   --   --   AST 17  --   --   --   --   ALT 8  --   --   --   --   ALKPHOS 54  --   --   --   --   BILITOT 1.2  --   --   --   --   GFRNONAA 45*   < > 40* 53* 26*  GFRAA 52*   < > 47* >60 31*  ANIONGAP 11   < > 12 14 10    < > = values in this interval not displayed.     Hematology Recent Labs  Lab 05/16/19 1840  WBC 7.8  RBC 3.84*  HGB 10.4*  HCT 35.7*  MCV 93.0  MCH 27.1  MCHC 29.1*  RDW 18.2*  PLT 178    BNP Recent Labs  Lab 05/16/19 1840  BNP 1,338.5*     DDimer No results for input(s): DDIMER in the last 168 hours.   Radiology    No results found.  Cardiac Studies   EF 60%  Patient Profile     74 y.o. female with coronary disease status post CABG in  2010 with permanent atrial fibrillation chronic diastolic heart failure, no anticoagulation secondary to bleeding episodes, pacemaker secondary to sick sinus syndrome, COPD on home oxygen with ongoing tobacco use here with progressive shortness of breath and cough.  Assessment & Plan    Progressive shortness of breath compatible with acute on chronic diastolic heart failure as well as COPD -Okay to continue with Lasix 80 mg twice a day, continue to monitor creatinine closely and maintain potassium greater than 4 magnesium greater than 2. -Continuing with metoprolol. -BNP was greater than 1300 originally and chest x-ray was compatible with pulmonary edema. --1.4 L out 2 days ago however yesterday only 390 net out-question overall accuracy, weight 92.4 on arrival 88.5 this morning -Creatinine did increase from 1.03 up to 1.84 and BUN also increased from 13-23.  Prerenal now, agree with stopping Lasix.  Makes me wonder now if the majority of her shortness of breath was actually more COPD related.  Permanent atrial fibrillation -Rates were recently difficult to control after device check on 04/2018 and this was improved with increasing metoprolol to 75 twice daily.  -No anticoagulation secondary to recurrent GI bleeding. -On diltiazem and metoprolol both for rate control.  Essential hypertension -Somewhat labile but stable.  Diltiazem and metoprolol are being utilized.  Hyperlipidemia -Continue with atorvastatin.  Lipid previously drawn by primary.  COPD Home O2 -Likely a strong contributor as well to her shortness of breath, especially now that we have diuresed her to prerenal azotemia.  Tobacco use -Continue to encourage ongoing cessation.  Mild dysphonia -Seen by speech, mild aspiration risk.  Complex medical decision making.     For questions or updates, please contact [place] Please consult [email or web address] for contact info under        Signed, Candee Furbish, MD  05/21/2019,  9:27 AM

## 2019-05-21 NOTE — Plan of Care (Signed)
  Problem: Education: Goal: Ability to demonstrate management of disease process will improve Outcome: Progressing Goal: Ability to verbalize understanding of medication therapies will improve Outcome: Progressing   

## 2019-05-22 DIAGNOSIS — I10 Essential (primary) hypertension: Secondary | ICD-10-CM

## 2019-05-22 LAB — GLUCOSE, CAPILLARY
Glucose-Capillary: 145 mg/dL — ABNORMAL HIGH (ref 70–99)
Glucose-Capillary: 144 mg/dL — ABNORMAL HIGH (ref 70–99)
Glucose-Capillary: 156 mg/dL — ABNORMAL HIGH (ref 70–99)
Glucose-Capillary: 182 mg/dL — ABNORMAL HIGH (ref 70–99)

## 2019-05-22 LAB — BASIC METABOLIC PANEL
Anion gap: 10 (ref 5–15)
BUN: 21 mg/dL (ref 8–23)
CO2: 33 mmol/L — ABNORMAL HIGH (ref 22–32)
Calcium: 10.3 mg/dL (ref 8.9–10.3)
Chloride: 97 mmol/L — ABNORMAL LOW (ref 98–111)
Creatinine, Ser: 1.23 mg/dL — ABNORMAL HIGH (ref 0.44–1.00)
GFR calc Af Amer: 50 mL/min — ABNORMAL LOW (ref >60)
GFR calc non Af Amer: 43 mL/min — ABNORMAL LOW (ref >60)
Glucose, Bld: 171 mg/dL — ABNORMAL HIGH (ref 70–99)
Potassium: 4.8 mmol/L (ref 3.5–5.1)
Sodium: 140 mmol/L (ref 135–145)

## 2019-05-22 MED ORDER — POTASSIUM CHLORIDE CRYS ER 10 MEQ PO TBCR: 10.0000 meq | EXTENDED_RELEASE_TABLET | Freq: Every day | ORAL | 0 refills | Status: DC

## 2019-05-22 MED ORDER — FUROSEMIDE 40 MG PO TABS
40.0000 mg | ORAL_TABLET | Freq: Every day | ORAL | Status: DC
  Administered 2019-05-22 – 2019-05-26 (×5): 40 mg via ORAL
  Filled 2019-05-22 (×5): qty 1

## 2019-05-22 MED ORDER — ALPRAZOLAM 0.25 MG PO TABS
0.2500 mg | ORAL_TABLET | Freq: Every day | ORAL | Status: DC
  Administered 2019-05-22 – 2019-05-25 (×4): 0.25 mg via ORAL
  Filled 2019-05-22 (×4): qty 1

## 2019-05-22 MED ORDER — LABETALOL HCL 5 MG/ML IV SOLN
20.0000 mg | INTRAVENOUS | Status: DC | PRN
  Administered 2019-05-22: 20 mg via INTRAVENOUS
  Filled 2019-05-22: qty 4

## 2019-05-22 MED ORDER — METHYLPREDNISOLONE SODIUM SUCC 40 MG IJ SOLR
40.0000 mg | Freq: Every day | INTRAMUSCULAR | Status: DC
  Administered 2019-05-22 – 2019-05-25 (×4): 40 mg via INTRAVENOUS
  Filled 2019-05-22 (×4): qty 1

## 2019-05-22 MED ORDER — ALPRAZOLAM 0.25 MG PO TABS: 0.2500 mg | ORAL_TABLET | Freq: Every evening | ORAL | 0 refills | Status: AC | PRN

## 2019-05-22 NOTE — Progress Notes (Addendum)
Physical Therapy Treatment Patient Details Name: Gabrielle [name] MRN: [id number] DOB: 1945-06-07 Today's Date: 05/22/2019    History of Present Illness 74 y.o. female with medical history significant of asthma, chronic atrial fibrillation, chronic diastolic CHF, COPD, coronary artery disease, hypertension and hyperlipidemia who was sent over by PCP secondary to progressive shortness of breath and cough for the last 3 weeks.  Pt admitted for Acute on chronic diastolic CHF    PT Comments    Pt continues to report not feeling well. She was not able to ambulate in the hallway with me this a.m. She did walk across the room and then back to recliner. She appeared drowsy and dyspneic with activity. O2 91% on 3L [place], 80% on RA (with activity). Assisted pt into recliner. Will continue to follow and progress activity as tolerated.     Follow Up Recommendations  Home health PT(vs cardiopulmonary rehab???)     Equipment Recommendations  None recommended by PT    Recommendations for Other Services       Precautions / Restrictions Precautions Precautions: Fall Precaution Comments: monitor sats and RR Restrictions Weight Bearing Restrictions: No    Mobility  Bed Mobility Overal bed mobility: Needs Assistance Bed Mobility: Supine to Sit     Supine to sit: Supervision;HOB elevated     General bed mobility comments: increased time and effort  Transfers Overall transfer level: Needs assistance Equipment used: 4-wheeled walker Transfers: Sit to/from Stand Sit to Stand: Min guard         General transfer comment: close guard for safety  Ambulation/Gait Ambulation/Gait assistance: Min guard Gait Distance (Feet): 20 Feet Assistive device: 4-wheeled walker Gait Pattern/deviations: Step-through pattern;Trunk flexed     General Gait Details: pt relied on UEs 2* fatigue. She walked across room and back. O2 91% on 3L, 81% on RA.   Stairs             Wheelchair Mobility     Modified Rankin (Stroke Patients Only)       Balance Overall balance assessment: Needs assistance           Standing balance-Leahy Scale: Fair                              Cognition Arousal/Alertness: Awake/alert(but drowsy) Behavior During Therapy: WFL for tasks assessed/performed Overall Cognitive Status: Within Functional Limits for tasks assessed                                        Exercises      General Comments        Pertinent Vitals/Pain Pain Assessment: No/denies pain    Home Living                      Prior Function            PT Goals (current goals can now be found in the care plan section) Progress towards PT goals: Not progressing toward goals - comment(pt continues to feel poorly and unable to tolerate much ambulation)    Frequency    Min 3X/week      PT Plan Current plan remains appropriate    Co-evaluation              AM-PAC PT "6 Clicks" Mobility   Outcome Measure  Help needed turning from your  back to your side while in a flat bed without using bedrails?: A Little Help needed moving from lying on your back to sitting on the side of a flat bed without using bedrails?: A Little Help needed moving to and from a bed to a chair (including a wheelchair)?: A Little Help needed standing up from a chair using your arms (e.g., wheelchair or bedside chair)?: A Little Help needed to walk in hospital room?: A Little Help needed climbing 3-5 steps with a railing? : A Little 6 Click Score: 18    End of Session Equipment Utilized During Treatment: Oxygen Activity Tolerance: Patient limited by fatigue Patient left: in chair;with call bell/phone within reach;with chair alarm set   PT Visit Diagnosis: Other abnormalities of gait and mobility (R26.89)     Time: 3567-0141 PT Time Calculation (min) (ACUTE ONLY): 25 min  Charges:  $Gait Training: 8-22 mins $Therapeutic Activity: 8-22 mins                          Faye Ramsay, PT Acute Rehabilitation

## 2019-05-22 NOTE — Discharge Instructions (Signed)
Acute Respiratory Failure, Adult  Acute respiratory failure occurs when there is not enough oxygen passing from your lungs to your body. When this happens, your lungs have trouble removing carbon dioxide from the blood. This causes your blood oxygen level to drop too low as carbon dioxide builds up. Acute respiratory failure is a medical emergency. It can develop quickly, but it is temporary if treated promptly. Your lung capacity, or how much air your lungs can hold, may improve with time, exercise, and treatment. What are the causes? There are many possible causes of acute respiratory failure, including:  Lung injury.  Chest injury or damage to the ribs or tissues near the lungs.  Lung conditions that affect the flow of air and blood into and out of the lungs, such as pneumonia, acute respiratory distress syndrome, and cystic fibrosis.  Medical conditions, such as strokes or spinal cord injuries, that affect the muscles and nerves that control breathing.  Blood infection (sepsis).  Inflammation of the pancreas (pancreatitis).  A blood clot in the lungs (pulmonary embolism).  A large-volume blood transfusion.  Burns.  Near-drowning.  Seizure.  Smoke inhalation.  Reaction to medicines.  Alcohol or drug overdose. What increases the risk? This condition is more likely to develop in people who have:  A blocked airway.  Asthma.  A condition or disease that damages or weakens the muscles, nerves, bones, or tissues that are involved in breathing.  A serious infection.  A health problem that blocks the unconscious reflex that is involved in breathing, such as hypothyroidism or sleep apnea.  A lung injury or trauma. What are the signs or symptoms? Trouble breathing is the main symptom of acute respiratory failure. Symptoms may also include:  Rapid breathing.  Restlessness or anxiety.  Skin, lips, or fingernails that appear blue (cyanosis).  Rapid heart  rate.  Abnormal heart rhythms (arrhythmias).  Confusion or changes in behavior.  Tiredness or loss of energy.  Feeling sleepy or having a loss of consciousness. How is this diagnosed? Your health care provider can diagnose acute respiratory failure with a medical history and physical exam. During the exam, your health care provider will listen to your heart and check for crackling or wheezing sounds in your lungs. Your may also have tests to confirm the diagnosis and determine what is causing respiratory failure. These tests may include:  Measuring the amount of oxygen in your blood (pulse oximetry). The measurement comes from a small device that is placed on your finger, earlobe, or toe.  Other blood tests to measure blood gases and to look for signs of infection.  Sampling your cerebral spinal fluid or tracheal fluid to check for infections.  Chest X-ray to look for fluid in spaces that should be filled with air.  Electrocardiogram (ECG) to look at the heart's electrical activity. How is this treated? Treatment for this condition usually takes places in a hospital intensive care unit (ICU). Treatment depends on what is causing the condition. It may include one or more treatments until your symptoms improve. Treatment may include:  Supplemental oxygen. Extra oxygen is given through a tube in the nose, a face mask, or a hood.  A device such as a continuous positive airway pressure (CPAP) or bi-level positive airway pressure (BiPAP or BPAP) machine. This treatment uses mild air pressure to keep the airways open. A mask or other device will be placed over your nose or mouth. A tube that is connected to a motor will deliver oxygen through the   mask.  Ventilator. This treatment helps move air into and out of the lungs. This may be done with a bag and mask or a machine. For this treatment, a tube is placed in your windpipe (trachea) so air and oxygen can flow to the lungs.  Extracorporeal  membrane oxygenation (ECMO). This treatment temporarily takes over the function of the heart and lungs, supplying oxygen and removing carbon dioxide. ECMO gives the lungs a chance to recover. It may be used if a ventilator is not effective.  Tracheostomy. This is a procedure that creates a hole in the neck to insert a breathing tube.  Receiving fluids and medicines.  Rocking the bed to help breathing. Follow these instructions at home:  Take over-the-counter and prescription medicines only as told by your health care provider.  Return to normal activities as told by your health care provider. Ask your health care provider what activities are safe for you.  Keep all follow-up visits as told by your health care provider. This is important. How is this prevented? Treating infections and medical conditions that may lead to acute respiratory failure can help prevent the condition from developing. Contact a health care provider if:  You have a fever.  Your symptoms do not improve or they get worse. Get help right away if:  You are having trouble breathing.  You lose consciousness.  Your have cyanosis or turn blue.  You develop a rapid heart rate.  You are confused. These symptoms may represent a serious problem that is an emergency. Do not wait to see if the symptoms will go away. Get medical help right away. Call your local emergency services (911 in the [place] Do not drive yourself to the hospital. This information is not intended to replace advice given to you by your health care provider. Make sure you discuss any questions you have with your health care provider. Document Revised: 01/29/2017 Document Reviewed: 09/04/2015 Elsevier Patient Education  2020 [employer]   Chronic Obstructive Pulmonary Disease Chronic obstructive pulmonary disease (COPD) is a long-term (chronic) lung problem. When you have COPD, it is hard for air to get in and out of your lungs. Usually the  condition gets worse over time, and your lungs will never return to normal. There are things you can do to keep yourself as healthy as possible.  Your doctor may treat your condition with: ? Medicines. ? Oxygen. ? Lung surgery.  Your doctor may also recommend: ? Rehabilitation. This includes steps to make your body work better. It may involve a team of specialists. ? Quitting smoking, if you smoke. ? Exercise and changes to your diet. ? Comfort measures (palliative care). Follow these instructions at home: Medicines  Take over-the-counter and prescription medicines only as told by your doctor.  Talk to your doctor before taking any cough or allergy medicines. You may need to avoid medicines that cause your lungs to be dry. Lifestyle  If you smoke, stop. Smoking makes the problem worse. If you need help quitting, ask your doctor.  Avoid being around things that make your breathing worse. This may include smoke, chemicals, and fumes.  Stay active, but remember to rest as well.  Learn and use tips on how to relax.  Make sure you get enough sleep. Most adults need at least 7 hours of sleep every night.  Eat healthy foods. Eat smaller meals more often. Rest before meals. Controlled breathing Learn and use tips on how to control your breathing as told by your   doctor. Try:  Breathing in (inhaling) through your nose for 1 second. Then, pucker your lips and breath out (exhale) through your lips for 2 seconds.  Putting one hand on your belly (abdomen). Breathe in slowly through your nose for 1 second. Your hand on your belly should move out. Pucker your lips and breathe out slowly through your lips. Your hand on your belly should move in as you breathe out.  Controlled coughing Learn and use controlled coughing to clear mucus from your lungs. Follow these steps: 1. Lean your head a little forward. 2. Breathe in deeply. 3. Try to hold your breath for 3 seconds. 4. Keep your mouth  slightly open while coughing 2 times. 5. Spit any mucus out into a tissue. 6. Rest and do the steps again 1 or 2 times as needed. General instructions  Make sure you get all the shots (vaccines) that your doctor recommends. Ask your doctor about a flu shot and a pneumonia shot.  Use oxygen therapy and pulmonary rehabilitation if told by your doctor. If you need home oxygen therapy, ask your doctor if you should buy a tool to measure your oxygen level (oximeter).  Make a COPD action plan with your doctor. This helps you to know what to do if you feel worse than usual.  Manage any other conditions you have as told by your doctor.  Avoid going outside when it is very hot, cold, or humid.  Avoid people who have a sickness you can catch (contagious).  Keep all follow-up visits as told by your doctor. This is important. Contact a doctor if:  You cough up more mucus than usual.  There is a change in the color or thickness of the mucus.  It is harder to breathe than usual.  Your breathing is faster than usual.  You have trouble sleeping.  You need to use your medicines more often than usual.  You have trouble doing your normal activities such as getting dressed or walking around the house. Get help right away if:  You have shortness of breath while resting.  You have shortness of breath that stops you from: ? Being able to talk. ? Doing normal activities.  Your chest hurts for longer than 5 minutes.  Your skin color is more blue than usual.  Your pulse oximeter shows that you have low oxygen for longer than 5 minutes.  You have a fever.  You feel too tired to breathe normally. Summary  Chronic obstructive pulmonary disease (COPD) is a long-term lung problem.  The way your lungs work will never return to normal. Usually the condition gets worse over time. There are things you can do to keep yourself as healthy as possible.  Take over-the-counter and prescription  medicines only as told by your doctor.  If you smoke, stop. Smoking makes the problem worse. This information is not intended to replace advice given to you by your health care provider. Make sure you discuss any questions you have with your health care provider. Document Revised: 01/29/2017 Document Reviewed: 03/23/2016 Elsevier Patient Education  2020 [employer]  

## 2019-05-22 NOTE — Progress Notes (Signed)
Progress Note  Patient Name: Gabrielle [name] Date of Encounter: 05/22/2019  Primary Cardiologist: Donato Schultz, MD   Subjective   Breathing issue same, not at baseline. No chest pain or palpitations.   Inpatient Medications    Scheduled Meds: . acetaminophen  650 mg Oral BID  . albuterol  2.5 mg Nebulization TID  . ALPRAZolam  1 mg Oral QHS  . atorvastatin  20 mg Oral q1800  . citalopram  20 mg Oral Daily  . clobetasol cream  1 application Topical BID  . diclofenac sodium  4 g Topical QID  . diltiazem  120 mg Oral Daily  . enoxaparin (LOVENOX) injection  30 mg Subcutaneous Q24H  . fluticasone  2 spray Each Nare Daily  . insulin aspart  0-5 Units Subcutaneous QHS  . insulin aspart  0-9 Units Subcutaneous TID WC  . Melatonin  10 mg Oral QHS  . metoprolol tartrate  75 mg Oral BID  . mometasone-formoterol  2 puff Inhalation BID  . pantoprazole  40 mg Oral Daily  . potassium chloride SA  40 mEq Oral Daily  . sodium chloride flush  3 mL Intravenous Q12H  . sucralfate  1 g Oral TID AC  . umeclidinium bromide  1 puff Inhalation Daily   Continuous Infusions: . sodium chloride 10 mL/hr (05/17/19 0158)  . sodium chloride     PRN Meds: sodium chloride, acetaminophen, albuterol, docusate sodium, guaiFENesin-dextromethorphan, labetalol, ondansetron (ZOFRAN) IV, sodium chloride flush, tiZANidine   Vital Signs    Vitals:   05/22/19 0601 05/22/19 0618 05/22/19 0725 05/22/19 0809  BP: (!) 190/120 (!) 156/125 (!) 157/111   Pulse: 87 79 97   Resp: 16     Temp: (!) 97.5 F (36.4 C)     TempSrc: Oral     SpO2: 94%   (!) 89%  Weight:      Height:        Intake/Output Summary (Last 24 hours) at 05/22/2019 0849 Last data filed at 05/22/2019 0716 Gross per 24 hour  Intake 360 ml  Output 700 ml  Net -340 ml   Last 3 Weights 05/22/2019 05/21/2019 05/20/2019  Weight (lbs) 195 lb 8.8 oz 195 lb 1.7 oz 195 lb 8 oz  Weight (kg) 88.7 kg 88.5 kg 88.678 kg      Telemetry    Atrial  fibrillation at rate of 90-100s - Personally Reviewed  ECG   No new tracing   Physical Exam   GEN: Obese female in no acute distress, on supplemental oxygen  Neck: No JVD Cardiac: Ir Ir, no murmurs, rubs, or gallops.  Respiratory: course breath sound on base ? Mild rales  GI: Soft, nontender, non-distended  MS: No edema; No deformity. Neuro:  Nonfocal  Psych: Normal affect   Labs    High Sensitivity Troponin:   Recent Labs  Lab 05/16/19 2008 05/17/19 0327  TROPONINIHS 6 9      Chemistry Recent Labs  Lab 05/16/19 1840 05/17/19 0327 05/20/19 0321 05/21/19 0323 05/22/19 0611  NA 141   < > 140 139 140  K 3.8   < > 4.0 4.5 4.8  CL 103   < > 94* 96* 97*  CO2 27   < > 32 33* 33*  GLUCOSE 165*   < > 194* 114* 171*  BUN 19   < > 13 23 21   CREATININE 1.19*   < > 1.03* 1.84* 1.23*  CALCIUM 9.4   < > 9.9 9.4 10.3  PROT  7.1  --   --   --   --   ALBUMIN 4.1  --   --   --   --   AST 17  --   --   --   --   ALT 8  --   --   --   --   ALKPHOS 54  --   --   --   --   BILITOT 1.2  --   --   --   --   GFRNONAA 45*   < > 53* 26* 43*  GFRAA 52*   < > >60 31* 50*  ANIONGAP 11   < > 14 10 10    < > = values in this interval not displayed.     Hematology Recent Labs  Lab 05/16/19 1840  WBC 7.8  RBC 3.84*  HGB 10.4*  HCT 35.7*  MCV 93.0  MCH 27.1  MCHC 29.1*  RDW 18.2*  PLT 178    BNP Recent Labs  Lab 05/16/19 1840  BNP 1,338.5*      Radiology    No results found.  Cardiac Studies   Echo 02/22/19 1. Left ventricular ejection fraction, by visual estimation, is 50 to  55%. The left ventricle has normal function. There is no left ventricular  hypertrophy.  2. Left ventricular diastolic parameters are consistent with Grade II  diastolic dysfunction (pseudonormalization).  3. Mildly dilated left ventricular internal cavity size.  4. The left ventricle demonstrates regional wall motion abnormalities.  5. Global right ventricle was not well  visualized.The right ventricular  size is not well visualized. Right vetricular wall thickness was not  assessed.  6. Left atrial size was severely dilated.  7. Right atrial size was not well visualized.  8. Moderate mitral annular calcification.  9. The mitral valve is normal in structure. Mild mitral valve  regurgitation. No evidence of mitral stenosis.  10. The tricuspid valve is not well visualized.  11. The aortic valve is tricuspid. Aortic valve regurgitation is not  visualized. Mild aortic valve sclerosis without stenosis.  12. The pulmonic valve was not well visualized. Pulmonic valve  regurgitation is trivial.  13. Mildly elevated pulmonary artery systolic pressure.  14. The inferior vena cava is normal in size with greater than 50%  respiratory variability, suggesting right atrial pressure of 3 mmHg.  15. Technically difficult; definity used; mild apical hypokinesis; overall  preserved LV function; grade 2 diastolic dysfunction; mild LVE; severe  LAE; mild MR; right heart not well visualized.   Patient Profile     74 y.o. female with hx of CAD s/p CABG in 2010, permanent atrial fibrillation (not on anticoagulation 2nd to bleeding issue), chronic diastolic CHF, SSS s/p PPM and COPD with ongoing tobacco smoking presented with progressive worsening shortness of breath and cough.   Assessment & Plan    1. Acute on chronic diastolic CHF - BNP was greater than 1300 originally and chest x-ray was compatible with pulmonary edema. - Treated with IV lasix 40mg  >> 80mg  BID with good urine output however held yesterday due to increased BUN/Scr 13/1.03>>23/1.29. Dr.Skains felt her symptoms is likely related to COPD given prerenal azotemia.  - Her BUN/SCr improved today to 21/1.23. She was taking lasix 20mg  at home. Will review Lasix dose with MD.  2. Acute on CKD III - AS above  3. Permanent atrial fibrillation  - Rate in 90-100s on Cardizem CD 120mg  qd and metoprolol 75mg   BID -No anticoagulation secondary to recurrent  GI bleeding.  4. Oxygen dependant COPD - no active wheezing  5. HTN - BP elevated  - may need increased BB  6. CAD - no angina - Continue ASA and statin   7. Tobacco smoking - Recommended cessation  8. Mild dysphonia -Seen by speech, mild aspiration risk.  For questions or updates, please contact [facility] Please consult [email or web address] for contact info under        SignedManson Passey, PA  05/22/2019, 8:49 AM

## 2019-05-22 NOTE — Care Management Important Message (Signed)
Important Message  Patient Details IM Letter given to Windell Moulding SW Case Manager to present to the Patient Name: Gabrielle [name] MRN: [id number] Date of Birth: [birth year]   Medicare Important Message Given:  Yes     Caren Macadam 05/22/2019, 12:54 PM

## 2019-05-22 NOTE — Progress Notes (Signed)
PROGRESS NOTE  JOSEFINA [name] XLK:[id number] DOB: [birth year] DOA: 05/16/2019 PCP: Henrine Screws, MD  HPI/Recap of past 24 hours: Gabrielle [name] is a 74 y.o. female with medical history significant of asthma, chronic atrial fibrillation, chronic diastolic CHF, COPD, coronary artery disease, hypertension and hyperlipidemia who was sent over by PCP secondary to progressive shortness of breath and cough for the last 3 weeks.  Associated with orthopnea and PND.    Worse with ambulation.  She had an echo back in December showing EF of 55 to 60%.  In the ED, BNP greater than 1300.  Chest x-ray suggestive of pulmonary edema.  Likely AoC dCHF.  TRH asked to admit.  Started on IV diuretic.  Ongoing diuresing.  Seen by cardio.  Increased IV lasix to 80 mg BID due to volume overload.   Self reported personal history of dysphagia.  Seen by Speech mild aspiration risk.  Rec reg/thin diet.  05/22/19: Seen and examined.  Drowsy.  Reduced dose of her PTA xanax.  Failed her home O2 eval this AM.    Assessment/Plan: Principal Problem:   Acute on chronic diastolic CHF (congestive heart failure) /EF 55 % Active Problems:   Hyperlipidemia   Depression   Hypertensive heart disease with CHF (congestive heart failure) (HCC)   Atrial fibrillation with controlled ventricular response (HCC)   Coronary artery disease due to lipid rich plaque   Obesity  Acute on chronic dCHF Presented with BNP >1300 with CXR suggestive of pulmonary edema Volume overload on exam Seen by cardiology.  Diuresed. Net I&O -3.1 L Electrolytes at goal Last 2D echo done on December 2020 showed normal LVEF 50 to 55% with grade 2 diastolic dysfunction C/W strict I's and O's and daily weight.  AKI on CKD3A likely prerenal in the setting of intravascular volume depletion, improving Baseline cr 1.0 with GFR 53 Cr 1.84 >> 1.2 Avoid nephrotoxins  Acute hypoxic respiratory failure likely multifactorial secondary to COPD, pulmonary  edema, resolved in the setting of acute on chronic diastolic CHF Not on oxygen supplementation at baseline Continue to maintain O2 saturation greater 90% Continue use incentive spirometer to minimize fluid buildup in the lungs. Continue bronchodilators Failed home O2 eval  COPD with exacerbation Will start IV solumedrol C/w bronchodilators Maintain O2 sat>90%  Hyperglycemia Serum glucose has been consistently elevated Hemoglobin A1c 6.4 C/w insulin sliding scale  Obesity BMI 35 Recommend weight loss outpatient with regular physical activity and healthy diet  Chronic A. Fib Rate controlled on p.o. diltiazem and metoprolol Not on DOAC due to significant history of GI bleed  History of asthma Continue home medications C/w duonebs q6H for wheezing  Chronic anxiety/depression Continue home medications  GERD Continue home medications  Hyperlipidemia Continue home statin  Coronary artery disease Denies any anginal symptom Continue home medications Continue to monitor on telemetry  Essential hypertension Blood pressure is currently at goal Continue to closely monitor blood pressure on IV diuretics   DVT prophylaxis: Lovenox subcu daily. Code Status: Full Family Communication:  Will call family if okay with the patient.  Disposition Plan:  Patient is from home.  Anticipate discharge to home in the next 24 hours once cardiology signs off.  Barrier to discharge: AKI, treatment for COPD exacerbation.  Consults called:  Cardiology      Objective: Vitals:   05/22/19 0725 05/22/19 0809 05/22/19 0943 05/22/19 1243  BP: (!) 157/111  (!) 158/113 (!) 127/95  Pulse: 97  99 91  Resp:   (!) 24 Marland Kitchen)  23  Temp:    98.4 F (36.9 C)  TempSrc:      SpO2:  (!) 89%    Weight:      Height:        Intake/Output Summary (Last 24 hours) at 05/22/2019 1401 Last data filed at 05/22/2019 0716 Gross per 24 hour  Intake --  Output 700 ml  Net -700 ml   Filed Weights   05/20/19  0400 05/21/19 0437 05/22/19 0500  Weight: 88.7 kg 88.5 kg 88.7 kg    Exam:  . General: 74 y.o. year-old female obese in no acute distress.  Somnolent but easily arousable to voices.  .  Cardiovascular: Irregular rate and rhythm no rubs or gallops. Marland Kitchen Respiratory: Mild wheezing noted diffusely.  No rales.  Poor inspiratory effort.  . Abdomen: Obese nontender normal bowel sounds present.   . Musculoskeletal: No lower extremity edema bilaterally.   Marland Kitchen Psychiatry: Mood is appropriate for condition and setting.   Data Reviewed: CBC: Recent Labs  Lab 05/16/19 1840  WBC 7.8  NEUTROABS 6.9  HGB 10.4*  HCT 35.7*  MCV 93.0  PLT 178   Basic Metabolic Panel: Recent Labs  Lab 05/17/19 0327 05/17/19 0327 05/18/19 0420 05/19/19 0419 05/20/19 0321 05/21/19 0323 05/22/19 0611  NA 142   < > 143 143 140 139 140  K 3.1*   < > 3.6 4.1 4.0 4.5 4.8  CL 101   < > 103 101 94* 96* 97*  CO2 29   < > 32 30 32 33* 33*  GLUCOSE 180*   < > 121* 158* 194* 114* 171*  BUN 17   < > 15 17 13 23 21   CREATININE 1.20*   < > 1.11* 1.29* 1.03* 1.84* 1.23*  CALCIUM 9.4   < > 9.1 9.7 9.9 9.4 10.3  MG 2.0  --   --   --   --   --   --    < > = values in this interval not displayed.   GFR: Estimated Creatinine Clearance: 41.4 mL/min (A) (by C-G formula based on SCr of 1.23 mg/dL (H)). Liver Function Tests: Recent Labs  Lab 05/16/19 1840  AST 17  ALT 8  ALKPHOS 54  BILITOT 1.2  PROT 7.1  ALBUMIN 4.1   No results for input(s): LIPASE, AMYLASE in the last 168 hours. No results for input(s): AMMONIA in the last 168 hours. Coagulation Profile: No results for input(s): INR, PROTIME in the last 168 hours. Cardiac Enzymes: No results for input(s): CKTOTAL, CKMB, CKMBINDEX, TROPONINI in the last 168 hours. BNP (last 3 results) No results for input(s): PROBNP in the last 8760 hours. HbA1C: Recent Labs    05/20/19 0318  HGBA1C 6.4*   CBG: Recent Labs  Lab 05/21/19 1137 05/21/19 1733 05/21/19 1953  05/22/19 0722 05/22/19 1126  GLUCAP 118* 185* 238* 145* 144*   Lipid Profile: No results for input(s): CHOL, HDL, LDLCALC, TRIG, CHOLHDL, LDLDIRECT in the last 72 hours. Thyroid Function Tests: No results for input(s): TSH, T4TOTAL, FREET4, T3FREE, THYROIDAB in the last 72 hours. Anemia Panel: No results for input(s): VITAMINB12, FOLATE, FERRITIN, TIBC, IRON, RETICCTPCT in the last 72 hours. Urine analysis:    Component Value Date/Time   COLORURINE YELLOW 02/21/2019 1810   APPEARANCEUR CLEAR 02/21/2019 1810   LABSPEC 1.018 02/21/2019 1810   PHURINE 6.0 02/21/2019 1810   GLUCOSEU NEGATIVE 02/21/2019 1810   HGBUR NEGATIVE 02/21/2019 1810   BILIRUBINUR NEGATIVE 02/21/2019 1810   KETONESUR NEGATIVE 02/21/2019  1810   PROTEINUR 30 (A) 02/21/2019 1810   UROBILINOGEN 1.0 12/02/2010 1023   NITRITE NEGATIVE 02/21/2019 1810   LEUKOCYTESUR NEGATIVE 02/21/2019 1810   Sepsis Labs: @LABRCNTIP (procalcitonin:4,lacticidven:4)  ) Recent Results (from the past 240 hour(s))  Respiratory Panel by RT PCR (Flu A&B, Covid) - Nasopharyngeal Swab     Status: None   Collection Time: 05/16/19  8:05 PM   Specimen: Nasopharyngeal Swab  Result Value Ref Range Status   SARS Coronavirus 2 by RT PCR NEGATIVE NEGATIVE Final    Comment: (NOTE) SARS-CoV-2 target nucleic acids are NOT DETECTED. The SARS-CoV-2 RNA is generally detectable in upper respiratoy specimens during the acute phase of infection. The lowest concentration of SARS-CoV-2 viral copies this assay can detect is 131 copies/mL. A negative result does not preclude SARS-Cov-2 infection and should not be used as the sole basis for treatment or other patient management decisions. A negative result may occur with  improper specimen collection/handling, submission of specimen other than nasopharyngeal swab, presence of viral mutation(s) within the areas targeted by this assay, and inadequate number of viral copies (<131 copies/mL). A negative  result must be combined with clinical observations, patient history, and epidemiological information. The expected result is Negative. Fact Sheet for Patients:  PinkCheek.be Fact Sheet for Healthcare Providers:  GravelBags.it This test is not yet ap proved or cleared by the [place] FDA and  has been authorized for detection and/or diagnosis of SARS-CoV-2 by FDA under an Emergency Use Authorization (EUA). This EUA will remain  in effect (meaning this test can be used) for the duration of the COVID-19 declaration under Section 564(b)(1) of the Act, 21 U.S.C. section 360bbb-3(b)(1), unless the authorization is terminated or revoked sooner.    Influenza A by PCR NEGATIVE NEGATIVE Final   Influenza B by PCR NEGATIVE NEGATIVE Final    Comment: (NOTE) The Xpert Xpress SARS-CoV-2/FLU/RSV assay is intended as an aid in  the diagnosis of influenza from Nasopharyngeal swab specimens and  should not be used as a sole basis for treatment. Nasal washings and  aspirates are unacceptable for Xpert Xpress SARS-CoV-2/FLU/RSV  testing. Fact Sheet for Patients: PinkCheek.be Fact Sheet for Healthcare Providers: GravelBags.it This test is not yet approved or cleared by the [place] FDA and  has been authorized for detection and/or diagnosis of SARS-CoV-2 by  FDA under an Emergency Use Authorization (EUA). This EUA will remain  in effect (meaning this test can be used) for the duration of the  Covid-19 declaration under Section 564(b)(1) of the Act, 21  U.S.C. section 360bbb-3(b)(1), unless the authorization is  terminated or revoked. Performed at [facility], [place] [address], [place], [place] [address]       Studies: No results found.  Scheduled Meds: . acetaminophen  650 mg Oral BID  . albuterol  2.5 mg Nebulization TID  . ALPRAZolam  0.25 mg Oral  QHS  . atorvastatin  20 mg Oral q1800  . citalopram  20 mg Oral Daily  . clobetasol cream  1 application Topical BID  . diclofenac sodium  4 g Topical QID  . diltiazem  120 mg Oral Daily  . enoxaparin (LOVENOX) injection  30 mg Subcutaneous Q24H  . fluticasone  2 spray Each Nare Daily  . furosemide  40 mg Oral Daily  . insulin aspart  0-5 Units Subcutaneous QHS  . insulin aspart  0-9 Units Subcutaneous TID WC  . Melatonin  10 mg Oral QHS  . metoprolol tartrate  75 mg  Oral BID  . mometasone-formoterol  2 puff Inhalation BID  . pantoprazole  40 mg Oral Daily  . sodium chloride flush  3 mL Intravenous Q12H  . sucralfate  1 g Oral TID AC  . umeclidinium bromide  1 puff Inhalation Daily    Continuous Infusions: . sodium chloride 10 mL/hr (05/17/19 0158)  . sodium chloride       LOS: 6 days     Darlin Drop, MD [facility] Pager [phone]  If 7PM-7AM, please contact night-coverage [email or web address] Password [facility] 05/22/2019, 2:01 PM

## 2019-05-22 NOTE — Progress Notes (Signed)
Occupational Therapy Treatment Patient Details Name: Gabrielle [name] MRN: [id number] DOB: [birth year] Today's Date: 05/22/2019    History of present illness 74 y.o. female with medical history significant of asthma, chronic atrial fibrillation, chronic diastolic CHF, COPD, coronary artery disease, hypertension and hyperlipidemia who was sent over by PCP secondary to progressive shortness of breath and cough for the last 3 weeks.  Pt admitted for Acute on chronic diastolic CHF   OT comments  Pt with limited participation this day. Pt reports not feeling well  Follow Up Recommendations  No OT follow up;Supervision - Intermittent    Equipment Recommendations  None recommended by OT    Recommendations for Other Services      Precautions / Restrictions Precautions Precautions: Fall Precaution Comments: monitor sats and RR Restrictions Weight Bearing Restrictions: No       Mobility Bed Mobility Overal bed mobility: Needs Assistance Bed Mobility: Supine to Sit;Sit to Supine     Supine to sit: HOB elevated;Min assist Sit to supine: Min assist   General bed mobility comments: increased time and effort  Transfers                 General transfer comment: did not perform    Balance Overall balance assessment: Needs assistance Sitting-balance support: No upper extremity supported;Feet supported Sitting balance-Leahy Scale: Fair                                     ADL either performed or assessed with clinical judgement   ADL Overall ADL's : Needs assistance/impaired     Grooming: Wash/dry hands;Min guard;Sitting;Wash/dry face Grooming Details (indicate cue type and reason): sitting EOB                               General ADL Comments: pt sat EOB with OT and needed MAX encouragement!  Pt very fatigued this afternoon and did not agree to get to recliner despite encouragement     Vision Patient Visual Report: No change from  baseline            Cognition Arousal/Alertness: Awake/alert(but drowsy) Behavior During Therapy: WFL for tasks assessed/performed Overall Cognitive Status: Within Functional Limits for tasks assessed                                                     Pertinent Vitals/ Pain       Pain Assessment: No/denies pain         Frequency  Min 2X/week        Progress Toward Goals  OT Goals(current goals can now be found in the care plan section)  Progress towards OT goals: OT to reassess next treatment     Plan Discharge plan remains appropriate;Discharge plan needs to be updated(depending on progress)    Co-evaluation                 AM-PAC OT "6 Clicks" Daily Activity     Outcome Measure   Help from another person eating meals?: None Help from another person taking care of personal grooming?: A Little Help from another person toileting, which includes using toliet, bedpan, or urinal?: A Little Help from another person bathing (including washing,  rinsing, drying)?: A Little Help from another person to put on and taking off regular upper body clothing?: A Little Help from another person to put on and taking off regular lower body clothing?: A Little 6 Click Score: 19    End of Session Equipment Utilized During Treatment: Oxygen  OT Visit Diagnosis: Other abnormalities of gait and mobility (R26.89)   Activity Tolerance Patient tolerated treatment well   Patient Left in chair;with call bell/phone within reach   Nurse Communication          Time: 3606-7703 OT Time Calculation (min): 21 min  Charges: OT General Charges $OT Visit: 1 Visit OT Treatments $Self Care/Home Management : 8-22 mins  Gabrielle [name], OT Acute Rehabilitation Services [phone] Office- [phone]      Gabrielle [name], Gabrielle [name] 05/22/2019, 5:58 PM

## 2019-05-23 ENCOUNTER — Inpatient Hospital Stay (HOSPITAL_COMMUNITY): Payer: Medicare Other

## 2019-05-23 DIAGNOSIS — I4819 Other persistent atrial fibrillation: Secondary | ICD-10-CM

## 2019-05-23 LAB — MAGNESIUM: Magnesium: 2.1 mg/dL (ref 1.7–2.4)

## 2019-05-23 LAB — CBC
HCT: 38.2 % (ref 36.0–46.0)
Hemoglobin: 11.1 g/dL — ABNORMAL LOW (ref 12.0–15.0)
MCH: 26.7 pg (ref 26.0–34.0)
MCHC: 29.1 g/dL — ABNORMAL LOW (ref 30.0–36.0)
MCV: 92 fL (ref 80.0–100.0)
Platelets: 207 10*3/uL (ref 150–400)
RBC: 4.15 MIL/uL (ref 3.87–5.11)
RDW: 18 % — ABNORMAL HIGH (ref 11.5–15.5)
WBC: 10.6 10*3/uL — ABNORMAL HIGH (ref 4.0–10.5)
nRBC: 0 % (ref 0.0–0.2)

## 2019-05-23 LAB — HEPATIC FUNCTION PANEL
ALT: 9 U/L (ref 0–44)
AST: 23 U/L (ref 15–41)
Albumin: 4.5 g/dL (ref 3.5–5.0)
Alkaline Phosphatase: 47 U/L (ref 38–126)
Bilirubin, Direct: 0.3 mg/dL — ABNORMAL HIGH (ref 0.0–0.2)
Indirect Bilirubin: 1 mg/dL — ABNORMAL HIGH (ref 0.3–0.9)
Total Bilirubin: 1.3 mg/dL — ABNORMAL HIGH (ref 0.3–1.2)
Total Protein: 7.8 g/dL (ref 6.5–8.1)

## 2019-05-23 LAB — GLUCOSE, CAPILLARY
Glucose-Capillary: 140 mg/dL — ABNORMAL HIGH (ref 70–99)
Glucose-Capillary: 162 mg/dL — ABNORMAL HIGH (ref 70–99)
Glucose-Capillary: 158 mg/dL — ABNORMAL HIGH (ref 70–99)
Glucose-Capillary: 165 mg/dL — ABNORMAL HIGH (ref 70–99)

## 2019-05-23 LAB — BASIC METABOLIC PANEL
Anion gap: 10 (ref 5–15)
BUN: 19 mg/dL (ref 8–23)
CO2: 35 mmol/L — ABNORMAL HIGH (ref 22–32)
Calcium: 10.5 mg/dL — ABNORMAL HIGH (ref 8.9–10.3)
Chloride: 93 mmol/L — ABNORMAL LOW (ref 98–111)
Creatinine, Ser: 0.98 mg/dL (ref 0.44–1.00)
GFR calc Af Amer: >60 mL/min (ref >60)
GFR calc non Af Amer: 56 mL/min — ABNORMAL LOW (ref >60)
Glucose, Bld: 164 mg/dL — ABNORMAL HIGH (ref 70–99)
Potassium: 5.5 mmol/L — ABNORMAL HIGH (ref 3.5–5.1)
Sodium: 138 mmol/L (ref 135–145)

## 2019-05-23 LAB — PHOSPHORUS: Phosphorus: 3.2 mg/dL (ref 2.5–4.6)

## 2019-05-23 LAB — LIPASE, BLOOD: Lipase: 19 U/L (ref 11–51)

## 2019-05-23 LAB — POTASSIUM: Potassium: 4.7 mmol/L (ref 3.5–5.1)

## 2019-05-23 MED ORDER — SENNOSIDES-DOCUSATE SODIUM 8.6-50 MG PO TABS
2.0000 | ORAL_TABLET | Freq: Two times a day (BID) | ORAL | Status: DC
  Administered 2019-05-24 – 2019-05-26 (×5): 2 via ORAL
  Filled 2019-05-23 (×6): qty 2

## 2019-05-23 MED ORDER — LEVALBUTEROL HCL 0.63 MG/3ML IN NEBU
0.6300 mg | INHALATION_SOLUTION | Freq: Four times a day (QID) | RESPIRATORY_TRACT | Status: DC
  Administered 2019-05-23 – 2019-05-25 (×11): 0.63 mg via RESPIRATORY_TRACT
  Filled 2019-05-23 (×11): qty 3

## 2019-05-23 MED ORDER — METOPROLOL TARTRATE 50 MG PO TABS
100.0000 mg | ORAL_TABLET | Freq: Two times a day (BID) | ORAL | Status: DC
  Administered 2019-05-23 – 2019-05-26 (×7): 100 mg via ORAL
  Filled 2019-05-23 (×7): qty 2

## 2019-05-23 MED ORDER — IPRATROPIUM BROMIDE 0.02 % IN SOLN
0.5000 mg | Freq: Four times a day (QID) | RESPIRATORY_TRACT | Status: DC
  Administered 2019-05-23 (×3): 0.5 mg via RESPIRATORY_TRACT
  Filled 2019-05-23 (×3): qty 2.5

## 2019-05-23 MED ORDER — LEVALBUTEROL HCL 0.63 MG/3ML IN NEBU: 0.6300 mg | INHALATION_SOLUTION | Freq: Four times a day (QID) | RESPIRATORY_TRACT | Status: DC | PRN

## 2019-05-23 MED ORDER — HYDRALAZINE HCL 25 MG PO TABS: 25.0000 mg | ORAL_TABLET | Freq: Three times a day (TID) | ORAL | Status: DC | PRN

## 2019-05-23 MED ORDER — BISACODYL 10 MG RE SUPP
10.0000 mg | Freq: Once | RECTAL | Status: DC
  Filled 2019-05-23: qty 1

## 2019-05-23 MED ORDER — POLYETHYLENE GLYCOL 3350 17 G PO PACK
17.0000 g | PACK | Freq: Every day | ORAL | Status: DC
  Administered 2019-05-23 – 2019-05-25 (×3): 17 g via ORAL
  Filled 2019-05-23 (×4): qty 1

## 2019-05-23 NOTE — Progress Notes (Signed)
Progress Note  Patient Name: Gabrielle [name] Date of Encounter: 05/23/2019  Primary Cardiologist: Donato Schultz, MD   Subjective   Breathing same. Diuresed well on low dose lasix.   Inpatient Medications    Scheduled Meds: . acetaminophen  650 mg Oral BID  . ALPRAZolam  0.25 mg Oral QHS  . atorvastatin  20 mg Oral q1800  . citalopram  20 mg Oral Daily  . clobetasol cream  1 application Topical BID  . diclofenac sodium  4 g Topical QID  . diltiazem  120 mg Oral Daily  . enoxaparin (LOVENOX) injection  30 mg Subcutaneous Q24H  . fluticasone  2 spray Each Nare Daily  . furosemide  40 mg Oral Daily  . insulin aspart  0-5 Units Subcutaneous QHS  . insulin aspart  0-9 Units Subcutaneous TID WC  . ipratropium  0.5 mg Nebulization Q6H  . levalbuterol  0.63 mg Nebulization Q6H  . melatonin  10 mg Oral QHS  . methylPREDNISolone (SOLU-MEDROL) injection  40 mg Intravenous Daily  . metoprolol tartrate  75 mg Oral BID  . mometasone-formoterol  2 puff Inhalation BID  . pantoprazole  40 mg Oral Daily  . sucralfate  1 g Oral TID AC  . umeclidinium bromide  1 puff Inhalation Daily   Continuous Infusions:  PRN Meds: acetaminophen, albuterol, docusate sodium, guaiFENesin-dextromethorphan, hydrALAZINE, ondansetron (ZOFRAN) IV   Vital Signs    Vitals:   05/22/19 2009 05/22/19 2027 05/22/19 2249 05/23/19 0508  BP:  (!) 155/88  (!) 154/106  Pulse:  92  84  Resp:  (!) 22  (!) 24  Temp:  98.3 F (36.8 C)  98.1 F (36.7 C)  TempSrc:  Oral    SpO2: 94% 93% 96% 96%  Weight:      Height:        Intake/Output Summary (Last 24 hours) at 05/23/2019 0829 Last data filed at 05/23/2019 0231 Gross per 24 hour  Intake --  Output 1600 ml  Net -1600 ml   Last 3 Weights 05/22/2019 05/21/2019 05/20/2019  Weight (lbs) 195 lb 8.8 oz 195 lb 1.7 oz 195 lb 8 oz  Weight (kg) 88.7 kg 88.5 kg 88.678 kg      Telemetry    Atrial fibrillation at rate of 90-100s - Personally Reviewed  ECG     N/A  Physical Exam   GEN: No acute distress.   Neck: No JVD Cardiac: Ir IR, no murmurs, rubs, or gallops.  Respiratory: Clear to auscultation bilaterally except faint crackles at base GI: Soft, nontender, non-distended  MS: No edema; No deformity. Neuro:  Nonfocal  Psych: Normal affect   Labs    High Sensitivity Troponin:   Recent Labs  Lab 05/16/19 2008 05/17/19 0327  TROPONINIHS 6 9      Chemistry Recent Labs  Lab 05/16/19 1840 05/17/19 0327 05/21/19 0323 05/22/19 0611 05/23/19 0716  NA 141   < > 139 140 138  K 3.8   < > 4.5 4.8 5.5*  CL 103   < > 96* 97* 93*  CO2 27   < > 33* 33* 35*  GLUCOSE 165*   < > 114* 171* 164*  BUN 19   < > 23 21 19   CREATININE 1.19*   < > 1.84* 1.23* 0.98  CALCIUM 9.4   < > 9.4 10.3 10.5*  PROT 7.1  --   --   --   --   ALBUMIN 4.1  --   --   --   --  AST 17  --   --   --   --   ALT 8  --   --   --   --   ALKPHOS 54  --   --   --   --   BILITOT 1.2  --   --   --   --   GFRNONAA 45*   < > 26* 43* 56*  GFRAA 52*   < > 31* 50* >60  ANIONGAP 11   < > 10 10 10    < > = values in this interval not displayed.     Hematology Recent Labs  Lab 05/16/19 1840 05/23/19 0716  WBC 7.8 10.6*  RBC 3.84* 4.15  HGB 10.4* 11.1*  HCT 35.7* 38.2  MCV 93.0 92.0  MCH 27.1 26.7  MCHC 29.1* 29.1*  RDW 18.2* 18.0*  PLT 178 207    BNP Recent Labs  Lab 05/16/19 1840  BNP 1,338.5*      Radiology    No results found.  Cardiac Studies   Echo 02/22/19 1. Left ventricular ejection fraction, by visual estimation, is 50 to  55%. The left ventricle has normal function. There is no left ventricular  hypertrophy.  2. Left ventricular diastolic parameters are consistent with Grade II  diastolic dysfunction (pseudonormalization).  3. Mildly dilated left ventricular internal cavity size.  4. The left ventricle demonstrates regional wall motion abnormalities.  5. Global right ventricle was not well visualized.The right ventricular   size is not well visualized. Right vetricular wall thickness was not  assessed.  6. Left atrial size was severely dilated.  7. Right atrial size was not well visualized.  8. Moderate mitral annular calcification.  9. The mitral valve is normal in structure. Mild mitral valve  regurgitation. No evidence of mitral stenosis.  10. The tricuspid valve is not well visualized.  11. The aortic valve is tricuspid. Aortic valve regurgitation is not  visualized. Mild aortic valve sclerosis without stenosis.  12. The pulmonic valve was not well visualized. Pulmonic valve  regurgitation is trivial.  13. Mildly elevated pulmonary artery systolic pressure.  14. The inferior vena cava is normal in size with greater than 50%  respiratory variability, suggesting right atrial pressure of 3 mmHg.  15. Technically difficult; definity used; mild apical hypokinesis; overall  preserved LV function; grade 2 diastolic dysfunction; mild LVE; severe  LAE; mild MR; right heart not well visualized.   Patient Profile       74 y.o. female with hx of CAD s/p CABG in 2010, permanent atrial fibrillation (not on anticoagulation 2nd to bleeding issue), chronic diastolic CHF, SSS s/p PPM and COPD with ongoing tobacco smoking presented with progressive worsening shortness of breath and cough.   Assessment & Plan    1. SOB/Acute on chronic diastolic CHF - BNP was greater than 1300 originally and chest x-ray was compatible with pulmonary edema. - Her shortness of breath is multifactorial - Treated with IV lasix 40mg  >> 80mg  BID with good urine output however held due to increased BUN/Scr due  prerenal azotemia. Now renal function back to normal.  - Diuresed > 1.5 L on lasix 40mg  since yesterday. Looks better today. Will continue current dose.   2. Acute on CKD III - AS above  3. Permanent atrial fibrillation  - Rate in 100s on Cardizem CD 120mg  qd and metoprolol 75mg  BID (increase to 100mg  BID given elevated  BP) -No anticoagulation secondary to recurrent GI bleeding.  4. Oxygen dependant COPD -  no active wheezing  5. HTN - BP elevated  - increase BB. Continue cardizem at current dose.   6. CAD - no angina - Continue ASA and statin   7. Tobacco smoking - Recommended cessation    For questions or updates, please contact [place] Please consult [email or web address] for contact info under        SignedLeanor Kail, PA  05/23/2019, 8:29 AM

## 2019-05-23 NOTE — Progress Notes (Addendum)
PROGRESS NOTE  [name] PTW:[id number] DOB: 10/04/1945 DOA: 05/16/2019 PCP: Henrine Screws, MD  HPI/Recap of past 24 hours: Gabrielle [name] is a 74 y.o. female with medical history significant of asthma, chronic atrial fibrillation not on OAC due to hx of severe GI bleed, chronic diastolic CHF, COPD, coronary artery disease, hypertension and hyperlipidemia who was sent over by PCP secondary to progressive shortness of breath and cough for the last 3 weeks.  Associated with orthopnea and PND.    Worse with ambulation.  She had an echo back in December showing EF of 55 to 60%.  In the ED, BNP greater than 1300.  Chest x-ray suggestive of pulmonary edema.  Likely AoC dCHF.  TRH asked to admit.  Started on IV diuretic.  Seen by cardio.  Increased IV lasix to 80 mg BID due to volume overload then DCed on 3/21 due to worsening creatinine up to 1.84.  Cr started to trend down.  She was restarted on po lasix on 3/22 by cardio. Creatinine normalized on 3/23, back to baseline 0.98 with GFR 56.  Due to persistent dyspnea and worsening hypoxia with wheezing, IV solumedrol was started on 3/22.   05/23/19: Reports nausea and vomiting overnight.  Also reports persistent dyspnea with minimal exertion.  CT chest/ AP ordered to further assess.    Assessment/Plan: Principal Problem:   Acute on chronic diastolic CHF (congestive heart failure) /EF 55 % Active Problems:   Hyperlipidemia   Depression   Hypertensive heart disease with CHF (congestive heart failure) (HCC)   Atrial fibrillation with controlled ventricular response (HCC)   Coronary artery disease due to lipid rich plaque   Obesity  Acute on chronic dCHF Presented with BNP >1300 with CXR suggestive of pulmonary edema Volume overload on exam Diuresed. Net I&O -4.7 L Last 2D echo done on December 2020 showed normal LVEF 50 to 55% with grade 2 diastolic dysfunction C/W strict I's and O's and daily weight. Cardiology  following.  Acute hypoxic respiratory failure likely multifactorial secondary to COPD versus pulmonary edema in the setting of acute on chronic diastolic CHF. Not on oxygen supplementation at baseline Continue to maintain O2 saturation greater 90% Continue use incentive spirometer to minimize fluid buildup in the lungs. Continue bronchodilators Failed home O2 eval 3/22, requiring at least 3 L to maintain O2 saturation greater than 90% with ambulation. Obtain CT chest to further assess  Intractable nausea with vomiting, unclear etiology Associated with intermittent abdominal pain affecting epigastric region, left lower and right lower quadrant Obtain lipase level and LFTs Afebrile with no leukocytosis No evidence of active infective process Possible gastroparesis in the setting of uncontrolled diabetes Start small meals low in fat; control hyperglycemia CT AP to further assess.  Type 2 diabetes with hyperglycemia, likely exacerbated by IV steroids IV Solu-Medrol day number 2 out of 5 Switch to prednisone with improvement of symptomatology, possibly tomorrow Continue insulin sliding scale Last hemoglobin A1c 6.4 on 05/20/2019  Suspected gastroparesis, rule out other etiologies CT AP pending Start conservative management with small meals and low-fat soluble fiber diet Antiemetics as needed  Resolved AKI on CKD3A likely prerenal in the setting of intravascular volume depletion Baseline cr 1.0 with GFR 53 She is back to her baseline creatinine 0.9 with GFR of 56 Continue to avoid nephrotoxins and hypotension  COPD with exacerbation IV solumedrol 40 mg daily x5 days, day #2 C/w bronchodilators Maintain O2 sat>90%  Obesity BMI 35 Recommend weight loss outpatient with regular physical  activity and healthy diet  Chronic A. Fib Rate controlled on p.o. diltiazem and metoprolol Not on DOAC due to significant history of GI bleed  History of severe GI bleed Hemoglobin is currently  stable at 11.1 from 10.4 No evidence of GI bleed  History of asthma Continue bronchodilators Maintain O2 sat greater than 90%  Chronic anxiety/depression Continue home medications  GERD Continue home medications  Hyperlipidemia Continue home statin  Coronary artery disease Denies any anginal symptom Continue home medications Continue to monitor on telemetry  Essential hypertension On p.o. diltiazem 120 mg daily and p.o. metoprolol 100 mg twice daily Continue to monitor vital sign   DVT prophylaxis: Lovenox subcu daily. Code Status: Full Family Communication:  Will call family if okay with the patient.  Disposition Plan:  Patient is from home.  Anticipate discharge to home in the next 24 hours once cardiology signs off.  Barrier to discharge: Persistent dyspnea, nausea vomiting with work-up, on IV Solu-Medrol as part of treatment for COPD exacerbation.  Consults called:  Cardiology      Objective: Vitals:   05/22/19 2249 05/23/19 0508 05/23/19 0832 05/23/19 0940  BP:  (!) 154/106  (!) 156/98  Pulse:  84  (!) 105  Resp:  (!) 24    Temp:  98.1 F (36.7 C)    TempSrc:      SpO2: 96% 96% 97%   Weight:      Height:        Intake/Output Summary (Last 24 hours) at 05/23/2019 1219 Last data filed at 05/23/2019 0231 Gross per 24 hour  Intake --  Output 1600 ml  Net -1600 ml   Filed Weights   05/20/19 0400 05/21/19 0437 05/22/19 0500  Weight: 88.7 kg 88.5 kg 88.7 kg    Exam:  . General: 74 y.o. year-old female obese in no acute distress.  Alert oriented x3.  Cardiovascular: Irregular rate and rhythm no rubs or gallops. Marland Kitchen Respiratory: Mild rales at bases no wheezing noted.  Poor inspiratory effort. . Abdomen: Obese with tenderness on palpation of epigastric region left lower and right lower quadrants.  Bowel sounds noted. . Musculoskeletal: No lower extremity edema bilaterally.   Marland Kitchen Psychiatry: Mood is appropriate for condition and setting.  Data  Reviewed: CBC: Recent Labs  Lab 05/16/19 1840 05/23/19 0716  WBC 7.8 10.6*  NEUTROABS 6.9  --   HGB 10.4* 11.1*  HCT 35.7* 38.2  MCV 93.0 92.0  PLT 178 299   Basic Metabolic Panel: Recent Labs  Lab 05/17/19 0327 05/18/19 0420 05/19/19 0419 05/20/19 0321 05/21/19 0323 05/22/19 0611 05/23/19 0716  NA 142   < > 143 140 139 140 138  K 3.1*   < > 4.1 4.0 4.5 4.8 5.5*  CL 101   < > 101 94* 96* 97* 93*  CO2 29   < > 30 32 33* 33* 35*  GLUCOSE 180*   < > 158* 194* 114* 171* 164*  BUN 17   < > 17 13 23 21 19   CREATININE 1.20*   < > 1.29* 1.03* 1.84* 1.23* 0.98  CALCIUM 9.4   < > 9.7 9.9 9.4 10.3 10.5*  MG 2.0  --   --   --   --   --  2.1  PHOS  --   --   --   --   --   --  3.2   < > = values in this interval not displayed.   GFR: Estimated Creatinine Clearance: 51.9 mL/min (  by C-G formula based on SCr of 0.98 mg/dL). Liver Function Tests: Recent Labs  Lab 05/16/19 1840  AST 17  ALT 8  ALKPHOS 54  BILITOT 1.2  PROT 7.1  ALBUMIN 4.1   No results for input(s): LIPASE, AMYLASE in the last 168 hours. No results for input(s): AMMONIA in the last 168 hours. Coagulation Profile: No results for input(s): INR, PROTIME in the last 168 hours. Cardiac Enzymes: No results for input(s): CKTOTAL, CKMB, CKMBINDEX, TROPONINI in the last 168 hours. BNP (last 3 results) No results for input(s): PROBNP in the last 8760 hours. HbA1C: No results for input(s): HGBA1C in the last 72 hours. CBG: Recent Labs  Lab 05/22/19 1126 05/22/19 1609 05/22/19 2117 05/23/19 0818 05/23/19 1214  GLUCAP 144* 156* 182* 140* 162*   Lipid Profile: No results for input(s): CHOL, HDL, LDLCALC, TRIG, CHOLHDL, LDLDIRECT in the last 72 hours. Thyroid Function Tests: No results for input(s): TSH, T4TOTAL, FREET4, T3FREE, THYROIDAB in the last 72 hours. Anemia Panel: No results for input(s): VITAMINB12, FOLATE, FERRITIN, TIBC, IRON, RETICCTPCT in the last 72 hours. Urine analysis:    Component Value  Date/Time   COLORURINE YELLOW 02/21/2019 1810   APPEARANCEUR CLEAR 02/21/2019 1810   LABSPEC 1.018 02/21/2019 1810   PHURINE 6.0 02/21/2019 1810   GLUCOSEU NEGATIVE 02/21/2019 1810   HGBUR NEGATIVE 02/21/2019 1810   BILIRUBINUR NEGATIVE 02/21/2019 1810   KETONESUR NEGATIVE 02/21/2019 1810   PROTEINUR 30 (A) 02/21/2019 1810   UROBILINOGEN 1.0 12/02/2010 1023   NITRITE NEGATIVE 02/21/2019 1810   LEUKOCYTESUR NEGATIVE 02/21/2019 1810   Sepsis Labs: @LABRCNTIP (procalcitonin:4,lacticidven:4)  ) Recent Results (from the past 240 hour(s))  Respiratory Panel by RT PCR (Flu A&B, Covid) - Nasopharyngeal Swab     Status: None   Collection Time: 05/16/19  8:05 PM   Specimen: Nasopharyngeal Swab  Result Value Ref Range Status   SARS Coronavirus 2 by RT PCR NEGATIVE NEGATIVE Final    Comment: (NOTE) SARS-CoV-2 target nucleic acids are NOT DETECTED. The SARS-CoV-2 RNA is generally detectable in upper respiratoy specimens during the acute phase of infection. The lowest concentration of SARS-CoV-2 viral copies this assay can detect is 131 copies/mL. A negative result does not preclude SARS-Cov-2 infection and should not be used as the sole basis for treatment or other patient management decisions. A negative result may occur with  improper specimen collection/handling, submission of specimen other than nasopharyngeal swab, presence of viral mutation(s) within the areas targeted by this assay, and inadequate number of viral copies (<131 copies/mL). A negative result must be combined with clinical observations, patient history, and epidemiological information. The expected result is Negative. Fact Sheet for Patients:  05/18/19 Fact Sheet for Healthcare Providers:  https://www.moore.com/ This test is not yet ap proved or cleared by the https://www.young.biz/ FDA and  has been authorized for detection and/or diagnosis of SARS-CoV-2 by FDA under an  Emergency Use Authorization (EUA). This EUA will remain  in effect (meaning this test can be used) for the duration of the COVID-19 declaration under Section 564(b)(1) of the Act, 21 U.S.C. section 360bbb-3(b)(1), unless the authorization is terminated or revoked sooner.    Influenza A by PCR NEGATIVE NEGATIVE Final   Influenza B by PCR NEGATIVE NEGATIVE Final    Comment: (NOTE) The Xpert Xpress SARS-CoV-2/FLU/RSV assay is intended as an aid in  the diagnosis of influenza from Nasopharyngeal swab specimens and  should not be used as a sole basis for treatment. Nasal washings and  aspirates are unacceptable  for Xpert Xpress SARS-CoV-2/FLU/RSV  testing. Fact Sheet for Patients: https://www.moore.com/ Fact Sheet for Healthcare Providers: https://www.young.biz/ This test is not yet approved or cleared by the [place] FDA and  has been authorized for detection and/or diagnosis of SARS-CoV-2 by  FDA under an Emergency Use Authorization (EUA). This EUA will remain  in effect (meaning this test can be used) for the duration of the  Covid-19 declaration under Section 564(b)(1) of the Act, 21  U.S.C. section 360bbb-3(b)(1), unless the authorization is  terminated or revoked. Performed at [facility], [address], [place], [place] [address]       Studies: No results found.  Scheduled Meds: . acetaminophen  650 mg Oral BID  . ALPRAZolam  0.25 mg Oral QHS  . atorvastatin  20 mg Oral q1800  . citalopram  20 mg Oral Daily  . clobetasol cream  1 application Topical BID  . diclofenac sodium  4 g Topical QID  . diltiazem  120 mg Oral Daily  . enoxaparin (LOVENOX) injection  30 mg Subcutaneous Q24H  . fluticasone  2 spray Each Nare Daily  . furosemide  40 mg Oral Daily  . insulin aspart  0-5 Units Subcutaneous QHS  . insulin aspart  0-9 Units Subcutaneous TID WC  . ipratropium  0.5 mg Nebulization Q6H  . levalbuterol  0.63  mg Nebulization Q6H  . melatonin  10 mg Oral QHS  . methylPREDNISolone (SOLU-MEDROL) injection  40 mg Intravenous Daily  . metoprolol tartrate  100 mg Oral BID  . mometasone-formoterol  2 puff Inhalation BID  . pantoprazole  40 mg Oral Daily  . sucralfate  1 g Oral TID AC  . umeclidinium bromide  1 puff Inhalation Daily    Continuous Infusions:    LOS: 7 days     Darlin Drop, MD [facility] Pager [phone]  If 7PM-7AM, please contact night-coverage [email or web address] Password [facility] 05/23/2019, 12:19 PM

## 2019-05-23 NOTE — Progress Notes (Signed)
Patient reports not feeling well over all. Pt was unable to describe. Pt has not eaten today. Nausea and or vomiting was denied. Pt reports not having an appetite despite being encouraged to eat. Will continue to monitor.

## 2019-05-23 NOTE — Progress Notes (Signed)
PT Cancellation Note  Patient Details Name: Gabrielle [name] MRN: [id number] DOB: [birth year]   Cancelled Treatment:    Reason Eval/Treat Not Completed: Patient declined, no reason specified. Will check back another day.    Faye Ramsay, PT Acute Rehabilitation

## 2019-05-24 LAB — BLOOD GAS, ARTERIAL
Acid-Base Excess: 11.6 mmol/L — ABNORMAL HIGH (ref 0.0–2.0)
Bicarbonate: 37.4 mmol/L — ABNORMAL HIGH (ref 20.0–28.0)
Drawn by: 295031
FIO2: 28
O2 Saturation: 95 %
Patient temperature: 98.6
pCO2 arterial: 56.1 mmHg — ABNORMAL HIGH (ref 32.0–48.0)
pH, Arterial: 7.439 (ref 7.350–7.450)
pO2, Arterial: 73.5 mmHg — ABNORMAL LOW (ref 83.0–108.0)

## 2019-05-24 LAB — GLUCOSE, CAPILLARY
Glucose-Capillary: 159 mg/dL — ABNORMAL HIGH (ref 70–99)
Glucose-Capillary: 144 mg/dL — ABNORMAL HIGH (ref 70–99)
Glucose-Capillary: 161 mg/dL — ABNORMAL HIGH (ref 70–99)
Glucose-Capillary: 204 mg/dL — ABNORMAL HIGH (ref 70–99)

## 2019-05-24 LAB — BASIC METABOLIC PANEL
Anion gap: 11 (ref 5–15)
BUN: 25 mg/dL — ABNORMAL HIGH (ref 8–23)
CO2: 34 mmol/L — ABNORMAL HIGH (ref 22–32)
Calcium: 10.8 mg/dL — ABNORMAL HIGH (ref 8.9–10.3)
Chloride: 91 mmol/L — ABNORMAL LOW (ref 98–111)
Creatinine, Ser: 1.16 mg/dL — ABNORMAL HIGH (ref 0.44–1.00)
GFR calc Af Amer: 53 mL/min — ABNORMAL LOW (ref >60)
GFR calc non Af Amer: 46 mL/min — ABNORMAL LOW (ref >60)
Glucose, Bld: 167 mg/dL — ABNORMAL HIGH (ref 70–99)
Potassium: 4.9 mmol/L (ref 3.5–5.1)
Sodium: 136 mmol/L (ref 135–145)

## 2019-05-24 LAB — CBC
HCT: 36.3 % (ref 36.0–46.0)
Hemoglobin: 11.1 g/dL — ABNORMAL LOW (ref 12.0–15.0)
MCH: 27.5 pg (ref 26.0–34.0)
MCHC: 30.6 g/dL (ref 30.0–36.0)
MCV: 89.9 fL (ref 80.0–100.0)
Platelets: 214 10*3/uL (ref 150–400)
RBC: 4.04 MIL/uL (ref 3.87–5.11)
RDW: 18 % — ABNORMAL HIGH (ref 11.5–15.5)
WBC: 16.2 10*3/uL — ABNORMAL HIGH (ref 4.0–10.5)
nRBC: 0 % (ref 0.0–0.2)

## 2019-05-24 LAB — TSH: TSH: 1.352 u[IU]/mL (ref 0.350–4.500)

## 2019-05-24 LAB — AMMONIA: Ammonia: 20 umol/L (ref 9–35)

## 2019-05-24 MED ORDER — NYSTATIN 100000 UNIT/ML MT SUSP
5.0000 mL | Freq: Four times a day (QID) | OROMUCOSAL | Status: DC
  Administered 2019-05-24 – 2019-05-26 (×8): 500000 [IU] via ORAL
  Filled 2019-05-24 (×8): qty 5

## 2019-05-24 NOTE — Progress Notes (Addendum)
PROGRESS NOTE    Gabrielle [name]  NTI:[id number] DOB: 01-09-1946 DOA: 05/16/2019 PCP: Henrine Screws, MD   Brief Narrative: 74 year old with past medical history significant for asthma, chronic A. fib not on anticoagulation due to history of severe GI bleed, chronic diastolic heart failure, COPD, CAD, hypertension who was sent for evaluation to the ED by her PCP for further evaluation of cough and shortness of breath for 3 weeks. Patient was found to be in acute on chronic diastolic heart failure exacerbation, she was treated with IV diuretics.  Cardiology was consulted.  She was subsequently changed to oral Lasix on 3/22 by cardiology.  Function has improved.  Persistent dyspnea and worsening hypoxemia and wheezing patient was a started on IV Solu-Medrol on 3/22. Patient reported nausea and vomiting on 3/23.  CT abdomen and pelvis done showed cirrhosis, left colonic diverticulosis nonobstructing bilateral nephrolithiasis.  CT chest diffuse interlobular septal thickening with patchy groundglass opacity compatible with pulmonary edema, platelike regions of atelectasis versus  scaring throughout both lungs similar to mildly worsened since 02/22/2019.   Assessment & Plan:   Principal Problem:   Acute on chronic diastolic CHF (congestive heart failure) /EF 55 % Active Problems:   Hyperlipidemia   Depression   Hypertensive heart disease with CHF (congestive heart failure) (HCC)   Atrial fibrillation with controlled ventricular response (HCC)   Coronary artery disease due to lipid rich plaque   Obesity  1-Acute on chronic diastolic heart failure Patient presents with shortness of breath, elevated BNP at 1300 chest x-ray suggestive of pulmonary edema. She was treated with IV Lasix. -4.7 L. Cardiology recommended to continue with oral Lasix, after they review CT chest  2-Acute hypoxic respiratory failure, multifactorial secondary to COPD versus pulmonary edema in the setting of acute on  chronic diastolic heart failure -We will continue with Solu-Medrol for another day IV. Continue with bronchodilators -CT chest with findings consistent of pulmonary edema, also platelike region of atelectasis vs scarring.  -needs to follow up with pulmonology  -Check blood gas.  CO2 chronically elevated compensated  3-Nausea and vomiting: CT abdomen and pelvis unrevealing. Report no further nausea vomiting. Report poor oral intake. Function test normal  4-oral thrush: Start nystatin  5-Type 2 diabetes hyperglycemia exacerbated by IV steroids. Continue with sliding scale insulin  6-AKI on CKD stage III A Improved.  COPD with exacerbation and acute Continue with IV Solu-Medrol CAD: Continue with metoprolol and diltiazem. Chronic anxiety depression continue medication Chronic A. fib: Continue with Cardizem and metoprolol Obesity; BMI 35.     Estimated body mass index is 34.14 kg/m as calculated from the following:   Height as of this encounter: 5' 2.5" (1.588 m).   Weight as of this encounter: 86 kg.   DVT prophylaxis: Lovenox Code Status: Full code Family Communication: Disposition Plan:  Patient is from: Home Anticipated d/c date: Home in 1 or 2 days if family will be able to provide 24 hours care Barriers to d/c or necessity for inpatient status: Patient with poor oral intake, not feeling well, will proceed checking ABG, ammonia level.  Consultants:   Cardiology  Procedures:     Antimicrobials:    Subjective: Patient reports that she is not feeling well, she is not able to tell me why she is not feeling well. She report mild dyspnea Patient with poor oral intake.   Objective: Vitals:   05/24/19 0300 05/24/19 0429 05/24/19 0803 05/24/19 1026  BP:  (!) 155/87    Pulse:  94  88  Resp:  (!) 24    Temp:  98 F (36.7 C)    TempSrc:  Oral    SpO2:  93% 94%   Weight: 86 kg     Height:        Intake/Output Summary (Last 24 hours) at 05/24/2019  1334 Last data filed at 05/24/2019 0458 Gross per 24 hour  Intake 480 ml  Output 850 ml  Net -370 ml   Filed Weights   05/21/19 0437 05/22/19 0500 05/24/19 0300  Weight: 88.5 kg 88.7 kg 86 kg    Examination:  General exam: Appears calm and comfortable  Respiratory system: Crackles bilaterally.  Cardiovascular system: S1 & S2 heard, RRR. No JVD, murmurs, rubs, gallops or clicks. No pedal edema. Gastrointestinal system: Abdomen is nondistended, soft and nontender. No organomegaly or masses felt. Normal bowel sounds heard. Central nervous system: Alert and oriented. No focal neurological deficits. Extremities: Symmetric 5 x 5 power. Skin: No rashes, lesions or ulcers   Data Reviewed: I have personally reviewed following labs and imaging studies  CBC: Recent Labs  Lab 05/23/19 0716 05/24/19 0404  WBC 10.6* 16.2*  HGB 11.1* 11.1*  HCT 38.2 36.3  MCV 92.0 89.9  PLT 207 214   Basic Metabolic Panel: Recent Labs  Lab 05/20/19 0321 05/20/19 0321 05/21/19 0323 05/22/19 0611 05/23/19 0716 05/23/19 1731 05/24/19 0404  NA 140  --  139 140 138  --  136  K 4.0   < > 4.5 4.8 5.5* 4.7 4.9  CL 94*  --  96* 97* 93*  --  91*  CO2 32  --  33* 33* 35*  --  34*  GLUCOSE 194*  --  114* 171* 164*  --  167*  BUN 13  --  23 21 19   --  25*  CREATININE 1.03*  --  1.84* 1.23* 0.98  --  1.16*  CALCIUM 9.9  --  9.4 10.3 10.5*  --  10.8*  MG  --   --   --   --  2.1  --   --   PHOS  --   --   --   --  3.2  --   --    < > = values in this interval not displayed.   GFR: Estimated Creatinine Clearance: 43.1 mL/min (A) (by C-G formula based on SCr of 1.16 mg/dL (H)). Liver Function Tests: Recent Labs  Lab 05/23/19 0716  AST 23  ALT 9  ALKPHOS 47  BILITOT 1.3*  PROT 7.8  ALBUMIN 4.5   Recent Labs  Lab 05/23/19 0716  LIPASE 19   Recent Labs  Lab 05/24/19 0913  AMMONIA 20   Coagulation Profile: No results for input(s): INR, PROTIME in the last 168 hours. Cardiac Enzymes: No  results for input(s): CKTOTAL, CKMB, CKMBINDEX, TROPONINI in the last 168 hours. BNP (last 3 results) No results for input(s): PROBNP in the last 8760 hours. HbA1C: No results for input(s): HGBA1C in the last 72 hours. CBG: Recent Labs  Lab 05/23/19 1214 05/23/19 1701 05/23/19 2134 05/24/19 0752 05/24/19 1104  GLUCAP 162* 158* 165* 159* 144*   Lipid Profile: No results for input(s): CHOL, HDL, LDLCALC, TRIG, CHOLHDL, LDLDIRECT in the last 72 hours. Thyroid Function Tests: Recent Labs    05/24/19 0913  TSH 1.352   Anemia Panel: No results for input(s): VITAMINB12, FOLATE, FERRITIN, TIBC, IRON, RETICCTPCT in the last 72 hours. Sepsis Labs: No results for input(s): PROCALCITON, LATICACIDVEN in the last 168  hours.  Recent Results (from the past 240 hour(s))  Respiratory Panel by RT PCR (Flu A&B, Covid) - Nasopharyngeal Swab     Status: None   Collection Time: 05/16/19  8:05 PM   Specimen: Nasopharyngeal Swab  Result Value Ref Range Status   SARS Coronavirus 2 by RT PCR NEGATIVE NEGATIVE Final    Comment: (NOTE) SARS-CoV-2 target nucleic acids are NOT DETECTED. The SARS-CoV-2 RNA is generally detectable in upper respiratoy specimens during the acute phase of infection. The lowest concentration of SARS-CoV-2 viral copies this assay can detect is 131 copies/mL. A negative result does not preclude SARS-Cov-2 infection and should not be used as the sole basis for treatment or other patient management decisions. A negative result may occur with  improper specimen collection/handling, submission of specimen other than nasopharyngeal swab, presence of viral mutation(s) within the areas targeted by this assay, and inadequate number of viral copies (<131 copies/mL). A negative result must be combined with clinical observations, patient history, and epidemiological information. The expected result is Negative. Fact Sheet for Patients:  https://www.moore.com/ Fact  Sheet for Healthcare Providers:  https://www.young.biz/ This test is not yet ap proved or cleared by the [place] FDA and  has been authorized for detection and/or diagnosis of SARS-CoV-2 by FDA under an Emergency Use Authorization (EUA). This EUA will remain  in effect (meaning this test can be used) for the duration of the COVID-19 declaration under Section 564(b)(1) of the Act, 21 U.S.C. section 360bbb-3(b)(1), unless the authorization is terminated or revoked sooner.    Influenza A by PCR NEGATIVE NEGATIVE Final   Influenza B by PCR NEGATIVE NEGATIVE Final    Comment: (NOTE) The Xpert Xpress SARS-CoV-2/FLU/RSV assay is intended as an aid in  the diagnosis of influenza from Nasopharyngeal swab specimens and  should not be used as a sole basis for treatment. Nasal washings and  aspirates are unacceptable for Xpert Xpress SARS-CoV-2/FLU/RSV  testing. Fact Sheet for Patients: https://www.moore.com/ Fact Sheet for Healthcare Providers: https://www.young.biz/ This test is not yet approved or cleared by the [place] FDA and  has been authorized for detection and/or diagnosis of SARS-CoV-2 by  FDA under an Emergency Use Authorization (EUA). This EUA will remain  in effect (meaning this test can be used) for the duration of the  Covid-19 declaration under Section 564(b)(1) of the Act, 21  U.S.C. section 360bbb-3(b)(1), unless the authorization is  terminated or revoked. Performed at [facility], [address], [place], [place] [address]          Radiology Studies: CT ABDOMEN PELVIS WO CONTRAST  Result Date: 05/23/2019 CLINICAL DATA:  Inpatient. Acute generalized abdominal pain. Hypoxemia, cough and dyspnea, reportedly refractory to diuretic therapy. Orthopnea. CHF. COPD. Asthma. EXAM: CT CHEST, ABDOMEN AND PELVIS WITHOUT CONTRAST TECHNIQUE: Multidetector CT imaging of the chest, abdomen and  pelvis was performed following the standard protocol without IV contrast. COMPARISON:  05/16/2019 chest radiograph. 02/22/2027 chest CT angiogram. 07/26/2018 CT abdomen/pelvis. FINDINGS: CT CHEST FINDINGS Cardiovascular: Mild cardiomegaly. No significant pericardial effusion/thickening. 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 2 lead left subclavian pacemaker with lead tips in the right atrium and right ventricular apex. Atherosclerotic nonaneurysmal thoracic aorta. Dilated main pulmonary artery (4.0 cm diameter). Mediastinum/Nodes: No discrete thyroid nodules. Unremarkable esophagus. No pathologically enlarged axillary, mediastinal or hilar lymph nodes, noting limited sensitivity for the detection of hilar adenopathy on this noncontrast study. Lungs/Pleura: No pneumothorax. Small right and trace left dependent pleural effusions. Diffuse interlobular  septal thickening with patchy ground-glass opacities in the upper lobes. Scattered mild to moderate platelike regions of atelectasis for scarring throughout both lungs, similar to mildly worsened since 02/22/2019 chest CT, most prominent in the apical upper lobes and lingula. Mild passive atelectasis in the dependent right lower lobe. No lung masses or significant pulmonary nodules. Musculoskeletal: No aggressive appearing focal osseous lesions. Intact sternotomy wires. Chronic moderate T9, mild T11 and severe T12 vertebral compression fractures. Mild thoracic spondylosis. CT ABDOMEN PELVIS FINDINGS Hepatobiliary: Normal liver with no liver mass. Diffusely irregular liver surface compatible with cirrhosis. No liver masses on this noncontrast scan. No biliary ductal dilatation. Pancreas: Normal, with no mass or duct dilation. Spleen: Normal size. No mass. Adrenals/Urinary Tract: Normal adrenals. Nonobstructing 2 mm upper and 2 mm interpolar right renal stones. Nonobstructing 7 mm upper left renal stone. No hydronephrosis. Simple 1.2 cm medial  lower left renal cyst. No additional contour deforming renal lesions. Normal bladder. Stomach/Bowel: Normal non-distended stomach. Normal caliber small bowel with no small bowel wall thickening. Normal appendix. Marked left colonic diverticulosis, most prominent in the sigmoid colon, with no large bowel wall thickening or acute pericolonic fat stranding. Vascular/Lymphatic: Atherosclerotic abdominal aorta with ectatic 2.6 cm infrarenal abdominal aorta, stable. No pathologically enlarged lymph nodes in the abdomen or pelvis. Reproductive: Grossly normal uterus.  No adnexal mass. Other: No pneumoperitoneum, ascites or focal fluid collection. Musculoskeletal: No aggressive appearing focal osseous lesions. Chronic mild superior L3 vertebral compression fracture. Mild lumbar spondylosis. IMPRESSION: 1. Mild cardiomegaly. Small right and trace left dependent pleural effusions. Diffuse interlobular septal thickening with patchy ground-glass opacities in the upper lobes, compatible with pulmonary edema. Dilated main pulmonary artery, suggesting pulmonary arterial hypertension. Findings are most compatible with congestive heart failure. 2. Scattered mild to moderate platelike regions of atelectasis versus scarring throughout both lungs, similar to mildly worsened since 02/22/2019 chest CT. 3. No acute abnormality in the abdomen or pelvis. No evidence of bowel obstruction or acute bowel inflammation. 4. Cirrhosis. No liver mass on this noncontrast scan. No ascites. Normal size spleen. 5. Nonobstructing bilateral nephrolithiasis. 6. Marked left colonic diverticulosis. 7. Stable ectatic 2.6 cm infrarenal abdominal aorta, at risk for aneurysm development. Recommend follow-up aortic ultrasound in 5 years. This recommendation follows ACR consensus guidelines: White Paper of the ACR Incidental Findings Committee II on Vascular Findings. J Am Coll Radiol 2013; 10:789-794. 8. Aortic Atherosclerosis (ICD10-I70.0). Electronically  Signed   By: Delbert PhenixJason A Poff M.D.   On: 05/23/2019 12:45   CT CHEST WO CONTRAST  Result Date: 05/23/2019 CLINICAL DATA:  Inpatient. Acute generalized abdominal pain. Hypoxemia, cough and dyspnea, reportedly refractory to diuretic therapy. Orthopnea. CHF. COPD. Asthma. EXAM: CT CHEST, ABDOMEN AND PELVIS WITHOUT CONTRAST TECHNIQUE: Multidetector CT imaging of the chest, abdomen and pelvis was performed following the standard protocol without IV contrast. COMPARISON:  05/16/2019 chest radiograph. 02/22/2027 chest CT angiogram. 07/26/2018 CT abdomen/pelvis. FINDINGS: CT CHEST FINDINGS Cardiovascular: Mild cardiomegaly. No significant pericardial effusion/thickening. 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 2 lead left subclavian pacemaker with lead tips in the right atrium and right ventricular apex. Atherosclerotic nonaneurysmal thoracic aorta. Dilated main pulmonary artery (4.0 cm diameter). Mediastinum/Nodes: No discrete thyroid nodules. Unremarkable esophagus. No pathologically enlarged axillary, mediastinal or hilar lymph nodes, noting limited sensitivity for the detection of hilar adenopathy on this noncontrast study. Lungs/Pleura: No pneumothorax. Small right and trace left dependent pleural effusions. Diffuse interlobular septal thickening with patchy ground-glass opacities in the upper lobes. Scattered mild to moderate platelike  regions of atelectasis for scarring throughout both lungs, similar to mildly worsened since 02/22/2019 chest CT, most prominent in the apical upper lobes and lingula. Mild passive atelectasis in the dependent right lower lobe. No lung masses or significant pulmonary nodules. Musculoskeletal: No aggressive appearing focal osseous lesions. Intact sternotomy wires. Chronic moderate T9, mild T11 and severe T12 vertebral compression fractures. Mild thoracic spondylosis. CT ABDOMEN PELVIS FINDINGS Hepatobiliary: Normal liver with no liver mass. Diffusely irregular  liver surface compatible with cirrhosis. No liver masses on this noncontrast scan. No biliary ductal dilatation. Pancreas: Normal, with no mass or duct dilation. Spleen: Normal size. No mass. Adrenals/Urinary Tract: Normal adrenals. Nonobstructing 2 mm upper and 2 mm interpolar right renal stones. Nonobstructing 7 mm upper left renal stone. No hydronephrosis. Simple 1.2 cm medial lower left renal cyst. No additional contour deforming renal lesions. Normal bladder. Stomach/Bowel: Normal non-distended stomach. Normal caliber small bowel with no small bowel wall thickening. Normal appendix. Marked left colonic diverticulosis, most prominent in the sigmoid colon, with no large bowel wall thickening or acute pericolonic fat stranding. Vascular/Lymphatic: Atherosclerotic abdominal aorta with ectatic 2.6 cm infrarenal abdominal aorta, stable. No pathologically enlarged lymph nodes in the abdomen or pelvis. Reproductive: Grossly normal uterus.  No adnexal mass. Other: No pneumoperitoneum, ascites or focal fluid collection. Musculoskeletal: No aggressive appearing focal osseous lesions. Chronic mild superior L3 vertebral compression fracture. Mild lumbar spondylosis. IMPRESSION: 1. Mild cardiomegaly. Small right and trace left dependent pleural effusions. Diffuse interlobular septal thickening with patchy ground-glass opacities in the upper lobes, compatible with pulmonary edema. Dilated main pulmonary artery, suggesting pulmonary arterial hypertension. Findings are most compatible with congestive heart failure. 2. Scattered mild to moderate platelike regions of atelectasis versus scarring throughout both lungs, similar to mildly worsened since 02/22/2019 chest CT. 3. No acute abnormality in the abdomen or pelvis. No evidence of bowel obstruction or acute bowel inflammation. 4. Cirrhosis. No liver mass on this noncontrast scan. No ascites. Normal size spleen. 5. Nonobstructing bilateral nephrolithiasis. 6. Marked left  colonic diverticulosis. 7. Stable ectatic 2.6 cm infrarenal abdominal aorta, at risk for aneurysm development. Recommend follow-up aortic ultrasound in 5 years. This recommendation follows ACR consensus guidelines: White Paper of the ACR Incidental Findings Committee II on Vascular Findings. J Am Coll Radiol 2013; 10:789-794. 8. Aortic Atherosclerosis (ICD10-I70.0). Electronically Signed   By: Delbert Phenix M.D.   On: 05/23/2019 12:45        Scheduled Meds: . acetaminophen  650 mg Oral BID  . ALPRAZolam  0.25 mg Oral QHS  . atorvastatin  20 mg Oral q1800  . bisacodyl  10 mg Rectal Once  . citalopram  20 mg Oral Daily  . clobetasol cream  1 application Topical BID  . diclofenac sodium  4 g Topical QID  . diltiazem  120 mg Oral Daily  . enoxaparin (LOVENOX) injection  30 mg Subcutaneous Q24H  . fluticasone  2 spray Each Nare Daily  . furosemide  40 mg Oral Daily  . insulin aspart  0-5 Units Subcutaneous QHS  . insulin aspart  0-9 Units Subcutaneous TID WC  . levalbuterol  0.63 mg Nebulization Q6H  . melatonin  10 mg Oral QHS  . methylPREDNISolone (SOLU-MEDROL) injection  40 mg Intravenous Daily  . metoprolol tartrate  100 mg Oral BID  . mometasone-formoterol  2 puff Inhalation BID  . nystatin  5 mL Oral QID  . pantoprazole  40 mg Oral Daily  . polyethylene glycol  17 g Oral Daily  .  senna-docusate  2 tablet Oral BID  . sucralfate  1 g Oral TID AC  . umeclidinium bromide  1 puff Inhalation Daily   Continuous Infusions:   LOS: 8 days    Time spent: 35 minutes    Aluel Schwarz A Jameel Quant, MD [facility]   If 7PM-7AM, please contact night-coverage [email or web address]  05/24/2019, 1:34 PM

## 2019-05-24 NOTE — Progress Notes (Signed)
OT Cancellation Note  Patient Details Name: Gabrielle [name] MRN: [id number] DOB: May 06, 1945   Cancelled Treatment:    Reason Eval/Treat Not Completed: Other (comment);Fatigue/lethargy limiting ability to participate . Pt reports she recently got back in bed after sitting up in recliner since her PT session earlier. Reports not feeling well. Plan to reattempt OT session at a later time/date.  Raynald Kemp, OT Acute Rehabilitation Services Pager: [phone] Office: [phone]  05/24/2019, 12:47 PM

## 2019-05-24 NOTE — Progress Notes (Addendum)
Physical Therapy Treatment Patient Details Name: Gabrielle [name] MRN: [id number] DOB: April 20, 1945 Today's Date: 05/24/2019    SATURATION QUALIFICATIONS: (This note is used to comply with regulatory documentation for home oxygen)  Patient Saturations on Room Air at Rest = 98%  Patient Saturations on Room Air while Ambulating = 86%  Patient Saturations on 2 Liters of oxygen while Ambulating = 89%     History of Present Illness 74 y.o. female with medical history significant of asthma, chronic atrial fibrillation, chronic diastolic CHF, COPD, coronary artery disease, hypertension and hyperlipidemia who was sent over by PCP secondary to progressive shortness of breath and cough for the last 3 weeks.  Pt admitted for Acute on chronic diastolic CHF    PT Comments    Better participation on today. Pt remains weak and fatigues easily with activity.  Pt lives alone-discussed d/c plan and possible need for ST rehab-pt declines placement and states she will return home. Encourage RW use for ambulation safety. Will continue to follow and progress activity as tolerated.    Follow Up Recommendations  Home health PT;Supervision/Assistance - 24 hour(pt declines placement)     Equipment Recommendations  None recommended by PT    Recommendations for Other Services       Precautions / Restrictions Precautions Precautions: Fall Precaution Comments: monitor sats and RR Restrictions Weight Bearing Restrictions: No    Mobility  Bed Mobility Overal bed mobility: Needs Assistance Bed Mobility: Supine to Sit     Supine to sit: Supervision;HOB elevated        Transfers Overall transfer level: Needs assistance Equipment used: None Transfers: Sit to/from Stand Sit to Stand: Min guard         General transfer comment: Close guard for safety. Stand pivot, bed to bsc  Ambulation/Gait Ambulation/Gait assistance: Min assist Gait Distance (Feet): 60 Feet Assistive device: (hallway  handrail) Gait Pattern/deviations: Step-through pattern;Decreased stride length     General Gait Details: Assist to stabilize throughout distance. Very unsteady. Fatigues easily. O2 89% on 2L [place], dyspnea 3/4. Distance limited by fatigue.   Stairs             Wheelchair Mobility    Modified Rankin (Stroke Patients Only)       Balance Overall balance assessment: Needs assistance           Standing balance-Leahy Scale: Fair                              Cognition Arousal/Alertness: Awake/alert(a little drowsy) Behavior During Therapy: WFL for tasks assessed/performed Overall Cognitive Status: Within Functional Limits for tasks assessed                                        Exercises      General Comments        Pertinent Vitals/Pain Pain Assessment: No/denies pain    Home Living                      Prior Function            PT Goals (current goals can now be found in the care plan section) Progress towards PT goals: Progressing toward goals    Frequency    Min 3X/week      PT Plan Current plan remains appropriate    Co-evaluation  AM-PAC PT "6 Clicks" Mobility   Outcome Measure  Help needed turning from your back to your side while in a flat bed without using bedrails?: A Little Help needed moving from lying on your back to sitting on the side of a flat bed without using bedrails?: A Little Help needed moving to and from a bed to a chair (including a wheelchair)?: A Little Help needed standing up from a chair using your arms (e.g., wheelchair or bedside chair)?: A Little Help needed to walk in hospital room?: A Little Help needed climbing 3-5 steps with a railing? : A Little 6 Click Score: 18    End of Session Equipment Utilized During Treatment: Oxygen Activity Tolerance: Patient limited by fatigue Patient left: in chair;with call bell/phone within reach;with chair alarm set    PT Visit Diagnosis: Other abnormalities of gait and mobility (R26.89)     Time: 4497-5300 PT Time Calculation (min) (ACUTE ONLY): 26 min  Charges:  $Gait Training: 23-37 mins                        Faye Ramsay, PT Acute Rehabilitation

## 2019-05-25 ENCOUNTER — Other Ambulatory Visit: Payer: Self-pay | Admitting: *Deleted

## 2019-05-25 LAB — GLUCOSE, CAPILLARY
Glucose-Capillary: 129 mg/dL — ABNORMAL HIGH (ref 70–99)
Glucose-Capillary: 160 mg/dL — ABNORMAL HIGH (ref 70–99)
Glucose-Capillary: 150 mg/dL — ABNORMAL HIGH (ref 70–99)
Glucose-Capillary: 174 mg/dL — ABNORMAL HIGH (ref 70–99)

## 2019-05-25 LAB — CBC
HCT: 37.8 % (ref 36.0–46.0)
Hemoglobin: 11.1 g/dL — ABNORMAL LOW (ref 12.0–15.0)
MCH: 27 pg (ref 26.0–34.0)
MCHC: 29.4 g/dL — ABNORMAL LOW (ref 30.0–36.0)
MCV: 92 fL (ref 80.0–100.0)
Platelets: 234 10*3/uL (ref 150–400)
RBC: 4.11 MIL/uL (ref 3.87–5.11)
RDW: 18 % — ABNORMAL HIGH (ref 11.5–15.5)
WBC: 16.2 10*3/uL — ABNORMAL HIGH (ref 4.0–10.5)
nRBC: 0 % (ref 0.0–0.2)

## 2019-05-25 LAB — BASIC METABOLIC PANEL
Anion gap: 13 (ref 5–15)
BUN: 36 mg/dL — ABNORMAL HIGH (ref 8–23)
CO2: 32 mmol/L (ref 22–32)
Calcium: 10.2 mg/dL (ref 8.9–10.3)
Chloride: 92 mmol/L — ABNORMAL LOW (ref 98–111)
Creatinine, Ser: 1.29 mg/dL — ABNORMAL HIGH (ref 0.44–1.00)
GFR calc Af Amer: 47 mL/min — ABNORMAL LOW (ref >60)
GFR calc non Af Amer: 40 mL/min — ABNORMAL LOW (ref >60)
Glucose, Bld: 198 mg/dL — ABNORMAL HIGH (ref 70–99)
Potassium: 4.3 mmol/L (ref 3.5–5.1)
Sodium: 137 mmol/L (ref 135–145)

## 2019-05-25 MED ORDER — LEVALBUTEROL HCL 0.63 MG/3ML IN NEBU
0.6300 mg | INHALATION_SOLUTION | Freq: Three times a day (TID) | RESPIRATORY_TRACT | Status: DC
  Administered 2019-05-26: 0.63 mg via RESPIRATORY_TRACT
  Filled 2019-05-25 (×2): qty 3

## 2019-05-25 MED ORDER — COSENTYX SENSOREADY (300 MG) 150 MG/ML ~~LOC~~ SOAJ: 2.0000 | SUBCUTANEOUS | 3 refills | Status: DC

## 2019-05-25 MED ORDER — ENOXAPARIN SODIUM 40 MG/0.4ML ~~LOC~~ SOLN
40.0000 mg | SUBCUTANEOUS | Status: DC
  Administered 2019-05-25: 40 mg via SUBCUTANEOUS
  Filled 2019-05-25 (×2): qty 0.4

## 2019-05-25 MED ORDER — PREDNISONE 20 MG PO TABS
40.0000 mg | ORAL_TABLET | Freq: Every day | ORAL | Status: DC
  Administered 2019-05-26: 40 mg via ORAL
  Filled 2019-05-25: qty 2

## 2019-05-25 NOTE — TOC Progression Note (Signed)
Transition of Care [facility]) - Progression Note    Patient Details  Name: Gabrielle [name] MRN: [id number] Date of Birth: 12/16/1945  Transition of Care [facility] CM/SW Contact  Darleene Cleaver, [place] Phone Number: 05/25/2019, 5:33 PM  Clinical Narrative:    CSW spoke with patient and her sister who was at bedside.  Patient was explained PT recommendations for SNF, and she would rather go home with home health.  CSW explained the pros and cons about going to SNF verse going home with home health, patient states she has made arrangements for her children, and her sisters to stay with her for a couple of weeks while she improves.  Patient is interested in home health services, she would like home health PT, OT, RN, Aide, and Social worker.  Patient will need oxygen, and did not state she has any other equipment needs, per patient she has a bedside commode, and a walker.  CSW to make referral to home health agency to see who can accept patient.     Expected Discharge Plan and Services  Patient is declining SNF placement and would like to go home with home health.         Expected Discharge Date: 05/22/19                                     Social Determinants of Health (SDOH) Interventions    Readmission Risk Interventions No flowsheet data found.

## 2019-05-25 NOTE — Progress Notes (Signed)
Occupational Therapy Treatment Patient Details Name: Gabrielle [name] MRN: [id number] DOB: May 22, 1945 Today's Date: 05/25/2019    History of present illness 74 y.o. female with medical history significant of asthma, chronic atrial fibrillation, chronic diastolic CHF, COPD, coronary artery disease, hypertension and hyperlipidemia who was sent over by PCP secondary to progressive shortness of breath and cough for the last 3 weeks.  Pt admitted for Acute on chronic diastolic CHF   OT comments  Pt progressing towards acute OT goals. Remains with deficits in standing balance as well as decreased activity tolerance and generalized weakness. Pt on 2L of O2 throughout session with sats at end of session at 98. Pt reporting nausea throughout session, nursing notified. Pt declined utilizing walker to mobilize in the room but noted to seek near constant single to bilateral upper extremity support. D/c plan updated to reflect need for 24/7 assist/supervision at time of d/c (vs SNF). Pt appears to be a little more receptive to ST SNF today, reports someone is looking into places for her.    Follow Up Recommendations  Supervision/Assistance - 24 hour;SNF    Equipment Recommendations  None recommended by OT    Recommendations for Other Services      Precautions / Restrictions Precautions Precautions: Fall Precaution Comments: monitor sats and RR Restrictions Weight Bearing Restrictions: No       Mobility Bed Mobility Overal bed mobility: Needs Assistance Bed Mobility: Supine to Sit;Sit to Supine     Supine to sit: Supervision Sit to supine: Supervision;Min guard   General bed mobility comments: increased time and effort  Transfers Overall transfer level: Needs assistance Equipment used: 1 person hand held assist Transfers: Sit to/from Stand Sit to Stand: Min guard         General transfer comment: min guard for safety. unsteadiness noted. seeks single extremity support of rails    Balance Overall balance assessment: Needs assistance Sitting-balance support: No upper extremity supported;Feet supported Sitting balance-Leahy Scale: Fair     Standing balance support: Single extremity supported;Bilateral upper extremity supported;During functional activity Standing balance-Leahy Scale: Poor Standing balance comment: seeks at least single extemity support of all dynamic standing tasks                           ADL either performed or assessed with clinical judgement   ADL Overall ADL's : Needs assistance/impaired                         Toilet Transfer: Min guard;Ambulation;BSC           Functional mobility during ADLs: Min guard(furniture walking in room, declined rw) General ADL Comments: seeks external support throughout all OOB activity in standing. Pt completed toilet transfer and household distance functional mobility. Seeking external support of furniture throughout session but declined rw.     Vision       Perception     Praxis      Cognition Arousal/Alertness: Awake/alert(a little sleepy) Behavior During Therapy: WFL for tasks assessed/performed Overall Cognitive Status: Within Functional Limits for tasks assessed                                          Exercises     Shoulder Instructions       General Comments Pt c/o nausea throughout session. Nursing notified.  Pertinent Vitals/ Pain          Home Living                                          Prior Functioning/Environment              Frequency  Min 2X/week        Progress Toward Goals  OT Goals(current goals can now be found in the care plan section)  Progress towards OT goals: Progressing toward goals  Acute Rehab OT Goals Patient Stated Goal: to feel better OT Goal Formulation: With patient Time For Goal Achievement: 06/02/19 Potential to Achieve Goals: Good ADL Goals Pt Will Perform Upper Body  Dressing: Independently;sitting Pt Will Perform Lower Body Dressing: with modified independence;Independently;sitting/lateral leans;sit to/from stand Pt Will Transfer to Toilet: with modified independence;ambulating;regular height toilet Pt Will Perform Tub/Shower Transfer: Shower transfer;with modified independence;shower seat;ambulating;rolling walker Additional ADL Goal #1: Patient will identify 3 energy conservation strategies to implement at home in order to maximize safety with self care.  Plan Discharge plan needs to be updated    Co-evaluation                 AM-PAC OT "6 Clicks" Daily Activity     Outcome Measure   Help from another person eating meals?: None Help from another person taking care of personal grooming?: A Little Help from another person toileting, which includes using toliet, bedpan, or urinal?: A Little Help from another person bathing (including washing, rinsing, drying)?: A Little Help from another person to put on and taking off regular upper body clothing?: A Little Help from another person to put on and taking off regular lower body clothing?: A Little 6 Click Score: 19    End of Session Equipment Utilized During Treatment: Oxygen(2L)  OT Visit Diagnosis: Other abnormalities of gait and mobility (R26.89)   Activity Tolerance Other (comment)(nausea throughout session)   Patient Left in bed;with call bell/phone within reach;with bed alarm set   Nurse Communication Other (comment)(nausea)        Time: 3762-8315 OT Time Calculation (min): 16 min  Charges: OT General Charges $OT Visit: 1 Visit OT Treatments $Self Care/Home Management : 8-22 mins  Tyrone Schimke, OT Acute Rehabilitation Services Pager: [phone] Office: [phone]    Hortencia Pilar 05/25/2019, 2:21 PM

## 2019-05-25 NOTE — Progress Notes (Signed)
PROGRESS NOTE    KAYBREE WILLIAMS  DJM:[id number] DOB: 06/28/1945 DOA: 05/16/2019 PCP: Aura Dials, MD   Brief Narrative: 74 year old with past medical history significant for asthma, chronic A. fib not on anticoagulation due to history of severe GI bleed, chronic diastolic heart failure, COPD, CAD, hypertension who was sent for evaluation to the ED by her PCP for further evaluation of cough and shortness of breath for 3 weeks. Patient was found to be in acute on chronic diastolic heart failure exacerbation, she was treated with IV diuretics.  Cardiology was consulted.  She was subsequently changed to oral Lasix on 3/22 by cardiology.  Function has improved.  Persistent dyspnea and worsening hypoxemia and wheezing patient was a started on IV Solu-Medrol on 3/22. Patient reported nausea and vomiting on 3/23.  CT abdomen and pelvis done showed cirrhosis, left colonic diverticulosis nonobstructing bilateral nephrolithiasis.  CT chest diffuse interlobular septal thickening with patchy groundglass opacity compatible with pulmonary edema, platelike regions of atelectasis versus  scaring throughout both lungs similar to mildly worsened since 02/22/2019.   Assessment & Plan:   Principal Problem:   Acute on chronic diastolic CHF (congestive heart failure) /EF 55 % Active Problems:   Hyperlipidemia   Depression   Hypertensive heart disease with CHF (congestive heart failure) (HCC)   Atrial fibrillation with controlled ventricular response (HCC)   Coronary artery disease due to lipid rich plaque   Obesity  1-Acute on chronic diastolic heart failure Patient presents with shortness of breath, elevated BNP at 1300 chest x-ray suggestive of pulmonary edema. She was treated with IV Lasix. -5.7 L. Cardiology recommended to continue with oral Lasix, after they review CT chest Stable.   2-Acute hypoxic respiratory failure, multifactorial secondary to COPD versus pulmonary edema in the setting of  acute on chronic diastolic heart failure -change steroid to prednisone tomorrow.  Continue with bronchodilators -CT chest with findings consistent of pulmonary edema, also platelike region of atelectasis vs scarring.  -needs to follow up with pulmonology  -blood gas.  CO2 chronically elevated compensated -report improvement of dyspnea.   3-Nausea and vomiting: CT abdomen and pelvis unrevealing. Report no further nausea vomiting. Report poor oral intake. LFunction test normal Started to eat more.   4-oral thrush: Started  Nystatin improved  5-Type 2 diabetes hyperglycemia exacerbated by IV steroids. Continue with sliding scale insulin  6-AKI on CKD stage III A Improved.  COPD with exacerbation and acute Continue with IV Solu-Medrol CAD: Continue with metoprolol and diltiazem. Chronic anxiety depression continue medication Chronic A. fib: Continue with Cardizem and metoprolol Obesity; BMI 35.   Leukocytosis ; related to steroids.   Estimated body mass index is 33.91 kg/m as calculated from the following:   Height as of this encounter: 5' 2.5" (1.588 m).   Weight as of this encounter: 85.5 kg.   DVT prophylaxis: Lovenox Code Status: Full code Family Communication: Disposition Plan:  Patient is from: Home Anticipated d/c date: Home in 1 if SNF bed available.  Barriers to d/c or necessity for inpatient status: poor oral intake, needs SNF   Consultants:   Cardiology  Procedures:     Antimicrobials:    Subjective: Started to eat a little more. Still feel weak and tired. Shoulder pain has improved. Dyspnea has improved.   Objective: Vitals:   05/25/19 0750 05/25/19 1219 05/25/19 1329 05/25/19 1531  BP:  (!) 150/101 (!) 149/82   Pulse:  86    Resp:  16    Temp:  97.6 F (  36.4 C)    TempSrc:  Oral    SpO2: 97% 97%  98%  Weight:      Height:        Intake/Output Summary (Last 24 hours) at 05/25/2019 1635 Last data filed at 05/25/2019 0200 Gross per 24  hour  Intake --  Output 200 ml  Net -200 ml   Filed Weights   05/22/19 0500 05/24/19 0300 05/25/19 0439  Weight: 88.7 kg 86 kg 85.5 kg    Examination:  General exam: NAD Respiratory system: No wheezing.  Cardiovascular system: S 1, S 2 RRR Gastrointestinal system: BS present, soft, nt Central nervous system: alert, follows command Extremities: no edema  Data Reviewed: I have personally reviewed following labs and imaging studies  CBC: Recent Labs  Lab 05/23/19 0716 05/24/19 0404 05/25/19 0335  WBC 10.6* 16.2* 16.2*  HGB 11.1* 11.1* 11.1*  HCT 38.2 36.3 37.8  MCV 92.0 89.9 92.0  PLT 207 214 234   Basic Metabolic Panel: Recent Labs  Lab 05/21/19 0323 05/21/19 0323 05/22/19 0611 05/23/19 0716 05/23/19 1731 05/24/19 0404 05/25/19 0335  NA 139  --  140 138  --  136 137  K 4.5   < > 4.8 5.5* 4.7 4.9 4.3  CL 96*  --  97* 93*  --  91* 92*  CO2 33*  --  33* 35*  --  34* 32  GLUCOSE 114*  --  171* 164*  --  167* 198*  BUN 23  --  21 19  --  25* 36*  CREATININE 1.84*  --  1.23* 0.98  --  1.16* 1.29*  CALCIUM 9.4  --  10.3 10.5*  --  10.8* 10.2  MG  --   --   --  2.1  --   --   --   PHOS  --   --   --  3.2  --   --   --    < > = values in this interval not displayed.   GFR: Estimated Creatinine Clearance: 38.7 mL/min (A) (by C-G formula based on SCr of 1.29 mg/dL (H)). Liver Function Tests: Recent Labs  Lab 05/23/19 0716  AST 23  ALT 9  ALKPHOS 47  BILITOT 1.3*  PROT 7.8  ALBUMIN 4.5   Recent Labs  Lab 05/23/19 0716  LIPASE 19   Recent Labs  Lab 05/24/19 0913  AMMONIA 20   Coagulation Profile: No results for input(s): INR, PROTIME in the last 168 hours. Cardiac Enzymes: No results for input(s): CKTOTAL, CKMB, CKMBINDEX, TROPONINI in the last 168 hours. BNP (last 3 results) No results for input(s): PROBNP in the last 8760 hours. HbA1C: No results for input(s): HGBA1C in the last 72 hours. CBG: Recent Labs  Lab 05/24/19 1612 05/24/19 2128  05/25/19 0750 05/25/19 1211 05/25/19 1616  GLUCAP 161* 204* 129* 160* 150*   Lipid Profile: No results for input(s): CHOL, HDL, LDLCALC, TRIG, CHOLHDL, LDLDIRECT in the last 72 hours. Thyroid Function Tests: Recent Labs    05/24/19 0913  TSH 1.352   Anemia Panel: No results for input(s): VITAMINB12, FOLATE, FERRITIN, TIBC, IRON, RETICCTPCT in the last 72 hours. Sepsis Labs: No results for input(s): PROCALCITON, LATICACIDVEN in the last 168 hours.  Recent Results (from the past 240 hour(s))  Respiratory Panel by RT PCR (Flu A&B, Covid) - Nasopharyngeal Swab     Status: None   Collection Time: 05/16/19  8:05 PM   Specimen: Nasopharyngeal Swab  Result Value Ref Range Status  SARS Coronavirus 2 by RT PCR NEGATIVE NEGATIVE Final    Comment: (NOTE) SARS-CoV-2 target nucleic acids are NOT DETECTED. The SARS-CoV-2 RNA is generally detectable in upper respiratoy specimens during the acute phase of infection. The lowest concentration of SARS-CoV-2 viral copies this assay can detect is 131 copies/mL. A negative result does not preclude SARS-Cov-2 infection and should not be used as the sole basis for treatment or other patient management decisions. A negative result may occur with  improper specimen collection/handling, submission of specimen other than nasopharyngeal swab, presence of viral mutation(s) within the areas targeted by this assay, and inadequate number of viral copies (<131 copies/mL). A negative result must be combined with clinical observations, patient history, and epidemiological information. The expected result is Negative. Fact Sheet for Patients:  https://www.moore.com/ Fact Sheet for Healthcare Providers:  https://www.young.biz/ This test is not yet ap proved or cleared by the [place] FDA and  has been authorized for detection and/or diagnosis of SARS-CoV-2 by FDA under an Emergency Use Authorization (EUA). This EUA  will remain  in effect (meaning this test can be used) for the duration of the COVID-19 declaration under Section 564(b)(1) of the Act, 21 U.S.C. section 360bbb-3(b)(1), unless the authorization is terminated or revoked sooner.    Influenza A by PCR NEGATIVE NEGATIVE Final   Influenza B by PCR NEGATIVE NEGATIVE Final    Comment: (NOTE) The Xpert Xpress SARS-CoV-2/FLU/RSV assay is intended as an aid in  the diagnosis of influenza from Nasopharyngeal swab specimens and  should not be used as a sole basis for treatment. Nasal washings and  aspirates are unacceptable for Xpert Xpress SARS-CoV-2/FLU/RSV  testing. Fact Sheet for Patients: https://www.moore.com/ Fact Sheet for Healthcare Providers: https://www.young.biz/ This test is not yet approved or cleared by the [place] FDA and  has been authorized for detection and/or diagnosis of SARS-CoV-2 by  FDA under an Emergency Use Authorization (EUA). This EUA will remain  in effect (meaning this test can be used) for the duration of the  Covid-19 declaration under Section 564(b)(1) of the Act, 21  U.S.C. section 360bbb-3(b)(1), unless the authorization is  terminated or revoked. Performed at [facility], [address] [place], [place] [address]          Radiology Studies: No results found.      Scheduled Meds: . acetaminophen  650 mg Oral BID  . ALPRAZolam  0.25 mg Oral QHS  . atorvastatin  20 mg Oral q1800  . bisacodyl  10 mg Rectal Once  . citalopram  20 mg Oral Daily  . clobetasol cream  1 application Topical BID  . diclofenac sodium  4 g Topical QID  . diltiazem  120 mg Oral Daily  . enoxaparin (LOVENOX) injection  40 mg Subcutaneous Q24H  . fluticasone  2 spray Each Nare Daily  . furosemide  40 mg Oral Daily  . insulin aspart  0-5 Units Subcutaneous QHS  . insulin aspart  0-9 Units Subcutaneous TID WC  . levalbuterol  0.63 mg Nebulization Q6H  .  melatonin  10 mg Oral QHS  . methylPREDNISolone (SOLU-MEDROL) injection  40 mg Intravenous Daily  . metoprolol tartrate  100 mg Oral BID  . mometasone-formoterol  2 puff Inhalation BID  . nystatin  5 mL Oral QID  . pantoprazole  40 mg Oral Daily  . polyethylene glycol  17 g Oral Daily  . senna-docusate  2 tablet Oral BID  . sucralfate  1 g Oral TID AC  .  umeclidinium bromide  1 puff Inhalation Daily   Continuous Infusions:   LOS: 9 days    Time spent: 35 minutes    Shekia Kuper A Marshun Duva, MD [facility]   If 7PM-7AM, please contact night-coverage [email or web address]  05/25/2019, 4:35 PM

## 2019-05-25 NOTE — Care Management Important Message (Signed)
Important Message  Patient Details IM Letter given to Windell Moulding SW Case Manager to present to the Patient Name: Gabrielle [name] MRN: [id number] Date of Birth: March 17, 1945   Medicare Important Message Given:  Yes     Caren Macadam 05/25/2019, 12:06 PM

## 2019-05-26 LAB — BASIC METABOLIC PANEL
Anion gap: 11 (ref 5–15)
BUN: 37 mg/dL — ABNORMAL HIGH (ref 8–23)
CO2: 32 mmol/L (ref 22–32)
Calcium: 10.1 mg/dL (ref 8.9–10.3)
Chloride: 95 mmol/L — ABNORMAL LOW (ref 98–111)
Creatinine, Ser: 1.53 mg/dL — ABNORMAL HIGH (ref 0.44–1.00)
GFR calc Af Amer: 38 mL/min — ABNORMAL LOW (ref >60)
GFR calc non Af Amer: 33 mL/min — ABNORMAL LOW (ref >60)
Glucose, Bld: 139 mg/dL — ABNORMAL HIGH (ref 70–99)
Potassium: 4.1 mmol/L (ref 3.5–5.1)
Sodium: 138 mmol/L (ref 135–145)

## 2019-05-26 LAB — CBC
HCT: 37 % (ref 36.0–46.0)
Hemoglobin: 11 g/dL — ABNORMAL LOW (ref 12.0–15.0)
MCH: 27.6 pg (ref 26.0–34.0)
MCHC: 29.7 g/dL — ABNORMAL LOW (ref 30.0–36.0)
MCV: 93 fL (ref 80.0–100.0)
Platelets: 198 10*3/uL (ref 150–400)
RBC: 3.98 MIL/uL (ref 3.87–5.11)
RDW: 17.5 % — ABNORMAL HIGH (ref 11.5–15.5)
WBC: 14.2 10*3/uL — ABNORMAL HIGH (ref 4.0–10.5)
nRBC: 0 % (ref 0.0–0.2)

## 2019-05-26 LAB — GLUCOSE, CAPILLARY
Glucose-Capillary: 134 mg/dL — ABNORMAL HIGH (ref 70–99)
Glucose-Capillary: 253 mg/dL — ABNORMAL HIGH (ref 70–99)

## 2019-05-26 MED ORDER — LEVALBUTEROL HCL 0.63 MG/3ML IN NEBU: 0.6300 mg | INHALATION_SOLUTION | Freq: Three times a day (TID) | RESPIRATORY_TRACT | 12 refills | Status: DC

## 2019-05-26 MED ORDER — NYSTATIN 100000 UNIT/ML MT SUSP: 5.0000 mL | Freq: Four times a day (QID) | OROMUCOSAL | 0 refills | Status: DC

## 2019-05-26 MED ORDER — AZITHROMYCIN 250 MG PO TABS
500.0000 mg | ORAL_TABLET | Freq: Every day | ORAL | Status: DC
  Administered 2019-05-26: 500 mg via ORAL

## 2019-05-26 MED ORDER — AZITHROMYCIN 250 MG PO TABS: 250.0000 mg | ORAL_TABLET | Freq: Every day | ORAL | 0 refills | Status: DC

## 2019-05-26 MED ORDER — PREDNISONE 20 MG PO TABS: 40.0000 mg | ORAL_TABLET | Freq: Every day | ORAL | 0 refills | Status: DC

## 2019-05-26 NOTE — TOC Transition Note (Signed)
Transition of Care [facility]) - CM/SW Discharge Note   Patient Details  Name: Gabrielle [name] MRN: [id number] Date of Birth: [birth year]  Transition of Care [facility]) CM/SW Contact:  Darleene Cleaver, LCSW Phone Number: 05/26/2019, 2:34 PM   Clinical Narrative:     CSW discussed home health with patient and she did not have a preference for home health agencies.  CSW spoke to [place] at [place] and she is agreeable to accepting patient.  CSW was informed they are a couple days behind, but should be able to see patient either Monday or Tuesday.   Final next level of care: Home w Home Health Services Barriers to Discharge: Barriers Resolved   Patient Goals and CMS Choice Patient states their goals for this hospitalization and ongoing recovery are:: To return back home with home health PT and OT CMS Medicare.gov Compare Post Acute Care list provided to:: Patient Choice offered to / list presented to : Patient, Sibling  Discharge Placement   Patient will be going home with home health through [facility].  CSW signing off please reconsult with any other social work needs, home health agency has been notified of planned discharge.                 Discharge Plan and Services                DME Arranged: Oxygen DME Agency: [facility] Date DME Agency Contacted: 05/26/19 Time DME Agency Contacted: 1000 Representative spoke with at DME Agency: Ian Malkin HH Arranged: PT, OT HH Agency: [facility] Date [facility] Agency Contacted: 05/26/19 Time HH Agency Contacted: [phone] Representative spoke with at [facility] Agency: Elnita Maxwell  Social Determinants of Health (SDOH) Interventions     Readmission Risk Interventions No flowsheet data found.

## 2019-05-26 NOTE — Progress Notes (Addendum)
Patient remains stable. Discharge instructions were reviewed, questions concerns denied. Pt discharged with all belongings, clothes, home oxygen.

## 2019-05-26 NOTE — Discharge Summary (Signed)
Physician Discharge Summary  Gabrielle [name] ACZ:[id number] DOB: Dec 28, 1945 DOA: 05/16/2019  PCP: Aura Dials, MD  Admit date: 05/16/2019 Discharge date: 05/26/2019  Admitted From:  Home  Disposition:  Home   Recommendations for Outpatient Follow-up:  1. Follow up with PCP in 1-2 weeks 2. Please obtain BMP/CBC in one week 3. Follow cbg when off steroids. Might need medications for DM.   Home Health: yes Equipment/Devices: home oxygen.   Discharge Condition: Stable.  CODE STATUS: Full code Diet recommendation: Heart Healthy   Brief/Interim Summary: 73 year old with past medical history significant for asthma, chronic A. fib not on anticoagulation due to history of severe GI bleed, chronic diastolic heart failure, COPD, CAD, hypertension who was sent for evaluation to the ED by her PCP for further evaluation of cough and shortness of breath for 3 weeks. Patient was found to be in acute on chronic diastolic heart failure exacerbation, she was treated with IV diuretics.  Cardiology was consulted.  She was subsequently changed to oral Lasix on 3/22 by cardiology.  Function has improved.  Persistent dyspnea and worsening hypoxemia and wheezing patient was a started on IV Solu-Medrol on 3/22. Patient reported nausea and vomiting on 3/23.  CT abdomen and pelvis done showed cirrhosis, left colonic diverticulosis nonobstructing bilateral nephrolithiasis.  CT chest diffuse interlobular septal thickening with patchy groundglass opacity compatible with pulmonary edema, platelike regions of atelectasis versus  scaring throughout both lungs similar to mildly worsened since 02/22/2019.  1-Acute on chronic diastolic heart failure Patient presents with shortness of breath, elevated BNP at 1300 chest x-ray suggestive of pulmonary edema. She was treated with IV Lasix. -5.7 L. Cardiology recommended to continue with oral Lasix, after they review CT chest Stable.   2-Acute hypoxic respiratory  failure, multifactorial secondary to COPD versus pulmonary edema in the setting of acute on chronic diastolic heart failure -change steroid to prednisone tomorrow.  Continue with bronchodilators -CT chest with findings consistent of pulmonary edema, also platelike region of atelectasis vs scarring.  -needs to follow up with pulmonology  -blood gas.  CO2 chronically elevated compensated -report improvement of dyspnea.   3-Nausea and vomiting: CT abdomen and pelvis unrevealing. Report no further nausea vomiting. Report poor oral intake. LFunction test normal Started to eat more.   4-oral thrush: Started  Nystatin improved  5-Type 2 diabetes hyperglycemia exacerbated by IV steroids. Continue with sliding scale insulin  6-AKI on CKD stage III A Improved.  COPD with exacerbation and acute Treated IV Solu-Medrol. Discharge on prednisone for 3 more days. Azithro,  CAD: Continue with metoprolol and diltiazem. Chronic anxiety depression continue medication Chronic A. fib: Continue with Cardizem and metoprolol Obesity; BMI 35.   Leukocytosis ; related to steroids.    Discharge Diagnoses:  Principal Problem:   Acute on chronic diastolic CHF (congestive heart failure) /EF 55 % Active Problems:   Hyperlipidemia   Depression   Hypertensive heart disease with CHF (congestive heart failure) (HCC)   Atrial fibrillation with controlled ventricular response (HCC)   Coronary artery disease due to lipid rich plaque   Obesity    Discharge Instructions  Discharge Instructions    Diet - low sodium heart healthy   Complete by: As directed    Diet - low sodium heart healthy   Complete by: As directed    Increase activity slowly   Complete by: As directed    Increase activity slowly   Complete by: As directed      Allergies as of 05/26/2019  Reactions   Novocain [procaine] Hives, Palpitations   Penicillins Hives, Other (See Comments)   Has patient had a PCN reaction  causing immediate rash, facial/tongue/throat swelling, SOB or lightheadedness with hypotension: No Has patient had a PCN reaction causing severe rash involving mucus membranes or skin necrosis: No Has patient had a PCN reaction that required hospitalization No Has patient had a PCN reaction occurring within the last 10 years: No If all of the above answers are "NO", then may proceed with Cephalosporin use.      Medication List    STOP taking these medications   guaiFENesin-dextromethorphan 100-10 MG/5ML syrup Commonly known as: ROBITUSSIN DM   tiZANidine 4 MG tablet Commonly known as: ZANAFLEX     TAKE these medications   albuterol 108 (90 Base) MCG/ACT inhaler Commonly known as: VENTOLIN HFA Inhale 2 puffs into the lungs every 6 (six) hours as needed for wheezing or shortness of breath.   ALPRAZolam 0.25 MG tablet Commonly known as: XANAX Take 1 tablet (0.25 mg total) by mouth at bedtime as needed for anxiety. What changed:   medication strength  how much to take  when to take this  reasons to take this   atorvastatin 20 MG tablet Commonly known as: LIPITOR Take 1 tablet (20 mg total) by mouth daily at 6 PM.   azithromycin 250 MG tablet Commonly known as: ZITHROMAX Take 1 tablet (250 mg total) by mouth daily.   budesonide-formoterol 160-4.5 MCG/ACT inhaler Commonly known as: Symbicort Inhale 2 puffs into the lungs 2 (two) times daily.   citalopram 20 MG tablet Commonly known as: CELEXA Take 40 mg by mouth daily.   clobetasol cream 0.05 % Commonly known as: TEMOVATE Apply 1 application topically 2 (two) times daily. Affected skin   Cosentyx Sensoready (300 MG) 150 MG/ML Soaj Generic drug: Secukinumab (300 MG Dose) Inject 2 Syringes into the skin every 28 (twenty-eight) days.   diclofenac sodium 1 % Gel Commonly known as: VOLTAREN Apply 4 g topically 4 (four) times daily.   diltiazem 120 MG 24 hr capsule Commonly known as: TIAZAC Take 1 capsule (120 mg  total) by mouth daily.   docusate sodium 100 MG capsule Commonly known as: COLACE Take 100 mg by mouth 2 (two) times daily as needed for moderate constipation.   fluticasone 50 MCG/ACT nasal spray Commonly known as: FLONASE Place 2 sprays into both nostrils daily.   furosemide 20 MG tablet Commonly known as: LASIX Take 1 tablet (20 mg total) by mouth daily. What changed: how much to take   levalbuterol 0.63 MG/3ML nebulizer solution Commonly known as: XOPENEX Take 3 mLs (0.63 mg total) by nebulization 3 (three) times daily.   Melatonin 10 MG Tabs Take 10 mg by mouth at bedtime.   metoprolol tartrate 50 MG tablet Commonly known as: LOPRESSOR Take 1.5 tablets (75 mg total) by mouth 2 (two) times daily.   nystatin 100000 UNIT/ML suspension Commonly known as: MYCOSTATIN Take 5 mLs (500,000 Units total) by mouth 4 (four) times daily.   pantoprazole 40 MG tablet Commonly known as: PROTONIX Take 1 tablet (40 mg total) by mouth daily.   potassium chloride 10 MEQ tablet Commonly known as: KLOR-CON Take 1 tablet (10 mEq total) by mouth daily. What changed:   medication strength  how much to take   predniSONE 20 MG tablet Commonly known as: DELTASONE Take 2 tablets (40 mg total) by mouth daily with breakfast. Start taking on: May 27, 2019   sucralfate 1 g tablet  Commonly known as: CARAFATE Take 1 g by mouth 3 (three) times daily before meals.   tiotropium 18 MCG inhalation capsule Commonly known as: SPIRIVA Place 18 mcg into inhaler and inhale daily.   TYLENOL 8 HOUR ARTHRITIS PAIN PO Take 2 tablets by mouth 2 (two) times daily. Back pain            Durable Medical Equipment  (From admission, onward)         Start     Ordered   05/22/19 1209  For home use only DME oxygen  Once    Question Answer Comment  Length of Need 6 Months   Mode or (Route) Nasal cannula   Liters per Minute 2   Frequency Continuous (stationary and portable oxygen unit needed)    Oxygen conserving device Yes   Oxygen delivery system Gas      05/22/19 1208         Follow-up Information    Storm Frisk, MD. Call in 1 day(s).   Specialty: Pulmonary Disease Why: please call for a post hospital follow up appointment Contact information: [address] [place] [address] [phone]        Jake Bathe, MD. Go on 06/13/2019.   Specialty: Cardiology Why: :20am for hospital follow up. Please arrive 15 minutes early  Contact information: [address] [place] [address] [phone]        Henrine Screws, MD Follow up in 1 week(s).   Specialty: Family Medicine Contact information: [address] [place] [address] [phone]          Allergies  Allergen Reactions  . Novocain [Procaine] Hives and Palpitations  . Penicillins Hives and Other (See Comments)    Has patient had a PCN reaction causing immediate rash, facial/tongue/throat swelling, SOB or lightheadedness with hypotension: No Has patient had a PCN reaction causing severe rash involving mucus membranes or skin necrosis: No Has patient had a PCN reaction that required hospitalization No Has patient had a PCN reaction occurring within the last 10 years: No If all of the above answers are "NO", then may proceed with Cephalosporin use.    Consultations:  cardiology   Procedures/Studies: CT ABDOMEN PELVIS WO CONTRAST  Result Date: 05/23/2019 CLINICAL DATA:  Inpatient. Acute generalized abdominal pain. Hypoxemia, cough and dyspnea, reportedly refractory to diuretic therapy. Orthopnea. CHF. COPD. Asthma. EXAM: CT CHEST, ABDOMEN AND PELVIS WITHOUT CONTRAST TECHNIQUE: Multidetector CT imaging of the chest, abdomen and pelvis was performed following the standard protocol without IV contrast. COMPARISON:  05/16/2019 chest radiograph. 02/22/2027 chest CT angiogram. 07/26/2018 CT abdomen/pelvis. FINDINGS: CT CHEST FINDINGS  Cardiovascular: Mild cardiomegaly. No significant pericardial effusion/thickening. 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 2 lead left subclavian pacemaker with lead tips in the right atrium and right ventricular apex. Atherosclerotic nonaneurysmal thoracic aorta. Dilated main pulmonary artery (4.0 cm diameter). Mediastinum/Nodes: No discrete thyroid nodules. Unremarkable esophagus. No pathologically enlarged axillary, mediastinal or hilar lymph nodes, noting limited sensitivity for the detection of hilar adenopathy on this noncontrast study. Lungs/Pleura: No pneumothorax. Small right and trace left dependent pleural effusions. Diffuse interlobular septal thickening with patchy ground-glass opacities in the upper lobes. Scattered mild to moderate platelike regions of atelectasis for scarring throughout both lungs, similar to mildly worsened since 02/22/2019 chest CT, most prominent in the apical upper lobes and lingula. Mild passive atelectasis in the dependent right lower lobe. No lung masses or significant pulmonary nodules.  Musculoskeletal: No aggressive appearing focal osseous lesions. Intact sternotomy wires. Chronic moderate T9, mild T11 and severe T12 vertebral compression fractures. Mild thoracic spondylosis. CT ABDOMEN PELVIS FINDINGS Hepatobiliary: Normal liver with no liver mass. Diffusely irregular liver surface compatible with cirrhosis. No liver masses on this noncontrast scan. No biliary ductal dilatation. Pancreas: Normal, with no mass or duct dilation. Spleen: Normal size. No mass. Adrenals/Urinary Tract: Normal adrenals. Nonobstructing 2 mm upper and 2 mm interpolar right renal stones. Nonobstructing 7 mm upper left renal stone. No hydronephrosis. Simple 1.2 cm medial lower left renal cyst. No additional contour deforming renal lesions. Normal bladder. Stomach/Bowel: Normal non-distended stomach. Normal caliber small bowel with no small bowel wall thickening. Normal  appendix. Marked left colonic diverticulosis, most prominent in the sigmoid colon, with no large bowel wall thickening or acute pericolonic fat stranding. Vascular/Lymphatic: Atherosclerotic abdominal aorta with ectatic 2.6 cm infrarenal abdominal aorta, stable. No pathologically enlarged lymph nodes in the abdomen or pelvis. Reproductive: Grossly normal uterus.  No adnexal mass. Other: No pneumoperitoneum, ascites or focal fluid collection. Musculoskeletal: No aggressive appearing focal osseous lesions. Chronic mild superior L3 vertebral compression fracture. Mild lumbar spondylosis. IMPRESSION: 1. Mild cardiomegaly. Small right and trace left dependent pleural effusions. Diffuse interlobular septal thickening with patchy ground-glass opacities in the upper lobes, compatible with pulmonary edema. Dilated main pulmonary artery, suggesting pulmonary arterial hypertension. Findings are most compatible with congestive heart failure. 2. Scattered mild to moderate platelike regions of atelectasis versus scarring throughout both lungs, similar to mildly worsened since 02/22/2019 chest CT. 3. No acute abnormality in the abdomen or pelvis. No evidence of bowel obstruction or acute bowel inflammation. 4. Cirrhosis. No liver mass on this noncontrast scan. No ascites. Normal size spleen. 5. Nonobstructing bilateral nephrolithiasis. 6. Marked left colonic diverticulosis. 7. Stable ectatic 2.6 cm infrarenal abdominal aorta, at risk for aneurysm development. Recommend follow-up aortic ultrasound in 5 years. This recommendation follows ACR consensus guidelines: White Paper of the ACR Incidental Findings Committee II on Vascular Findings. J Am Coll Radiol 2013; 10:789-794. 8. Aortic Atherosclerosis (ICD10-I70.0). Electronically Signed   By: Delbert Phenix M.D.   On: 05/23/2019 12:45   DG Chest 2 View  Result Date: 05/16/2019 CLINICAL DATA:  Dyspnea. EXAM: CHEST - 2 VIEW COMPARISON:  April 05, 2019 FINDINGS: Stable mild  cardiomegaly, dual lead cardiac pacemaker placement with left-sided battery pack, intact median sternotomy wires, mediastinal surgical clips and CABG markers. Redemonstration of mild pulmonary edema with trace left pleural effusion. Minimal linear atelectasis in the right lateral costophrenic angle. No pneumothorax or pneumonia. Streaky infrahilar consolidation, probably atelectatic or nonspecific bronchitis. Aortic knob calcified atherosclerosis and moderate skeletal degenerative changes. IMPRESSION: Stable mild cardiomegaly and thoracic postoperative changes with mild pulmonary edema, trace left pleural effusion and bilateral infrahilar streaky consolidation that is probably atelectatic or a nonspecific bronchitis. Aortic calcified atherosclerosis. Electronically Signed   By: Laurence Ferrari   On: 05/16/2019 17:57   CT CHEST WO CONTRAST  Result Date: 05/23/2019 CLINICAL DATA:  Inpatient. Acute generalized abdominal pain. Hypoxemia, cough and dyspnea, reportedly refractory to diuretic therapy. Orthopnea. CHF. COPD. Asthma. EXAM: CT CHEST, ABDOMEN AND PELVIS WITHOUT CONTRAST TECHNIQUE: Multidetector CT imaging of the chest, abdomen and pelvis was performed following the standard protocol without IV contrast. COMPARISON:  05/16/2019 chest radiograph. 02/22/2027 chest CT angiogram. 07/26/2018 CT abdomen/pelvis. FINDINGS: CT CHEST FINDINGS Cardiovascular: Mild cardiomegaly. No significant pericardial effusion/thickening. 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 2 lead left subclavian pacemaker with lead tips  in the right atrium and right ventricular apex. Atherosclerotic nonaneurysmal thoracic aorta. Dilated main pulmonary artery (4.0 cm diameter). Mediastinum/Nodes: No discrete thyroid nodules. Unremarkable esophagus. No pathologically enlarged axillary, mediastinal or hilar lymph nodes, noting limited sensitivity for the detection of hilar adenopathy on this noncontrast study. Lungs/Pleura:  No pneumothorax. Small right and trace left dependent pleural effusions. Diffuse interlobular septal thickening with patchy ground-glass opacities in the upper lobes. Scattered mild to moderate platelike regions of atelectasis for scarring throughout both lungs, similar to mildly worsened since 02/22/2019 chest CT, most prominent in the apical upper lobes and lingula. Mild passive atelectasis in the dependent right lower lobe. No lung masses or significant pulmonary nodules. Musculoskeletal: No aggressive appearing focal osseous lesions. Intact sternotomy wires. Chronic moderate T9, mild T11 and severe T12 vertebral compression fractures. Mild thoracic spondylosis. CT ABDOMEN PELVIS FINDINGS Hepatobiliary: Normal liver with no liver mass. Diffusely irregular liver surface compatible with cirrhosis. No liver masses on this noncontrast scan. No biliary ductal dilatation. Pancreas: Normal, with no mass or duct dilation. Spleen: Normal size. No mass. Adrenals/Urinary Tract: Normal adrenals. Nonobstructing 2 mm upper and 2 mm interpolar right renal stones. Nonobstructing 7 mm upper left renal stone. No hydronephrosis. Simple 1.2 cm medial lower left renal cyst. No additional contour deforming renal lesions. Normal bladder. Stomach/Bowel: Normal non-distended stomach. Normal caliber small bowel with no small bowel wall thickening. Normal appendix. Marked left colonic diverticulosis, most prominent in the sigmoid colon, with no large bowel wall thickening or acute pericolonic fat stranding. Vascular/Lymphatic: Atherosclerotic abdominal aorta with ectatic 2.6 cm infrarenal abdominal aorta, stable. No pathologically enlarged lymph nodes in the abdomen or pelvis. Reproductive: Grossly normal uterus.  No adnexal mass. Other: No pneumoperitoneum, ascites or focal fluid collection. Musculoskeletal: No aggressive appearing focal osseous lesions. Chronic mild superior L3 vertebral compression fracture. Mild lumbar spondylosis.  IMPRESSION: 1. Mild cardiomegaly. Small right and trace left dependent pleural effusions. Diffuse interlobular septal thickening with patchy ground-glass opacities in the upper lobes, compatible with pulmonary edema. Dilated main pulmonary artery, suggesting pulmonary arterial hypertension. Findings are most compatible with congestive heart failure. 2. Scattered mild to moderate platelike regions of atelectasis versus scarring throughout both lungs, similar to mildly worsened since 02/22/2019 chest CT. 3. No acute abnormality in the abdomen or pelvis. No evidence of bowel obstruction or acute bowel inflammation. 4. Cirrhosis. No liver mass on this noncontrast scan. No ascites. Normal size spleen. 5. Nonobstructing bilateral nephrolithiasis. 6. Marked left colonic diverticulosis. 7. Stable ectatic 2.6 cm infrarenal abdominal aorta, at risk for aneurysm development. Recommend follow-up aortic ultrasound in 5 years. This recommendation follows ACR consensus guidelines: White Paper of the ACR Incidental Findings Committee II on Vascular Findings. J Am Coll Radiol 2013; 10:789-794. 8. Aortic Atherosclerosis (ICD10-I70.0). Electronically Signed   By: Delbert Phenix M.D.   On: 05/23/2019 12:45   CUP PACEART REMOTE DEVICE CHECK  Result Date: 05/09/2019 Scheduled remote reviewed.  Normal device function. Persistent AFL since approx 02/26/19, overall rates controlled. Known per Epic note 04/06/19. No OAC due to history of GI bleed. 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 She is going home. Her sister will stay with her.   Discharge Exam: Vitals:   05/26/19 0808 05/26/19 0901  BP:  125/78  Pulse:  86  Resp:  20  Temp:    SpO2: 98% 95%     General: Pt is alert, awake, not in acute distress Cardiovascular: RRR, S1/S2 +, no rubs, no gallops Respiratory:  CTA bilaterally, no wheezing, no rhonchi Abdominal: Soft, NT, ND, bowel sounds + Extremities: no edema, no cyanosis    The  results of significant diagnostics from this hospitalization (including imaging, microbiology, ancillary and laboratory) are listed below for reference.     Microbiology: Recent Results (from the past 240 hour(s))  Respiratory Panel by RT PCR (Flu A&B, Covid) - Nasopharyngeal Swab     Status: None   Collection Time: 05/16/19  8:05 PM   Specimen: Nasopharyngeal Swab  Result Value Ref Range Status   SARS Coronavirus 2 by RT PCR NEGATIVE NEGATIVE Final    Comment: (NOTE) SARS-CoV-2 target nucleic acids are NOT DETECTED. The SARS-CoV-2 RNA is generally detectable in upper respiratoy specimens during the acute phase of infection. The lowest concentration of SARS-CoV-2 viral copies this assay can detect is 131 copies/mL. A negative result does not preclude SARS-Cov-2 infection and should not be used as the sole basis for treatment or other patient management decisions. A negative result may occur with  improper specimen collection/handling, submission of specimen other than nasopharyngeal swab, presence of viral mutation(s) within the areas targeted by this assay, and inadequate number of viral copies (<131 copies/mL). A negative result must be combined with clinical observations, patient history, and epidemiological information. The expected result is Negative. Fact Sheet for Patients:  https://www.moore.com/ Fact Sheet for Healthcare Providers:  https://www.young.biz/ This test is not yet ap proved or cleared by the [place] FDA and  has been authorized for detection and/or diagnosis of SARS-CoV-2 by FDA under an Emergency Use Authorization (EUA). This EUA will remain  in effect (meaning this test can be used) for the duration of the COVID-19 declaration under Section 564(b)(1) of the Act, 21 U.S.C. section 360bbb-3(b)(1), unless the authorization is terminated or revoked sooner.    Influenza A by PCR NEGATIVE NEGATIVE Final   Influenza B by  PCR NEGATIVE NEGATIVE Final    Comment: (NOTE) The Xpert Xpress SARS-CoV-2/FLU/RSV assay is intended as an aid in  the diagnosis of influenza from Nasopharyngeal swab specimens and  should not be used as a sole basis for treatment. Nasal washings and  aspirates are unacceptable for Xpert Xpress SARS-CoV-2/FLU/RSV  testing. Fact Sheet for Patients: https://www.moore.com/ Fact Sheet for Healthcare Providers: https://www.young.biz/ This test is not yet approved or cleared by the [place] FDA and  has been authorized for detection and/or diagnosis of SARS-CoV-2 by  FDA under an Emergency Use Authorization (EUA). This EUA will remain  in effect (meaning this test can be used) for the duration of the  Covid-19 declaration under Section 564(b)(1) of the Act, 21  U.S.C. section 360bbb-3(b)(1), unless the authorization is  terminated or revoked. Performed at [facility], [address] [place], [place] [address]      Labs: BNP (last 3 results) Recent Labs    07/04/18 1049 02/21/19 2213 05/16/19 1840  BNP 1,210.2* 484.5* 1,338.5*   Basic Metabolic Panel: Recent Labs  Lab 05/22/19 0611 05/22/19 0611 05/23/19 0716 05/23/19 1731 05/24/19 0404 05/25/19 0335 05/26/19 0352  NA 140  --  138  --  136 137 138  K 4.8   < > 5.5* 4.7 4.9 4.3 4.1  CL 97*  --  93*  --  91* 92* 95*  CO2 33*  --  35*  --  34* 32 32  GLUCOSE 171*  --  164*  --  167* 198* 139*  BUN 21  --  19  --  25* 36* 37*  CREATININE 1.23*  --  0.98  --  1.16* 1.29* 1.53*  CALCIUM 10.3  --  10.5*  --  10.8* 10.2 10.1  MG  --   --  2.1  --   --   --   --   PHOS  --   --  3.2  --   --   --   --    < > = values in this interval not displayed.   Liver Function Tests: Recent Labs  Lab 05/23/19 0716  AST 23  ALT 9  ALKPHOS 47  BILITOT 1.3*  PROT 7.8  ALBUMIN 4.5   Recent Labs  Lab 05/23/19 0716  LIPASE 19   Recent Labs  Lab 05/24/19 0913   AMMONIA 20   CBC: Recent Labs  Lab 05/23/19 0716 05/24/19 0404 05/25/19 0335 05/26/19 0352  WBC 10.6* 16.2* 16.2* 14.2*  HGB 11.1* 11.1* 11.1* 11.0*  HCT 38.2 36.3 37.8 37.0  MCV 92.0 89.9 92.0 93.0  PLT 207 214 234 198   Cardiac Enzymes: No results for input(s): CKTOTAL, CKMB, CKMBINDEX, TROPONINI in the last 168 hours. BNP: Invalid input(s): POCBNP CBG: Recent Labs  Lab 05/25/19 1211 05/25/19 1616 05/25/19 2137 05/26/19 0758 05/26/19 1124  GLUCAP 160* 150* 174* 134* 253*   D-Dimer No results for input(s): DDIMER in the last 72 hours. Hgb A1c No results for input(s): HGBA1C in the last 72 hours. Lipid Profile No results for input(s): CHOL, HDL, LDLCALC, TRIG, CHOLHDL, LDLDIRECT in the last 72 hours. Thyroid function studies Recent Labs    05/24/19 0913  TSH 1.352   Anemia work up No results for input(s): VITAMINB12, FOLATE, FERRITIN, TIBC, IRON, RETICCTPCT in the last 72 hours. Urinalysis    Component Value Date/Time   COLORURINE YELLOW 02/21/2019 1810   APPEARANCEUR CLEAR 02/21/2019 1810   LABSPEC 1.018 02/21/2019 1810   PHURINE 6.0 02/21/2019 1810   GLUCOSEU NEGATIVE 02/21/2019 1810   HGBUR NEGATIVE 02/21/2019 1810   BILIRUBINUR NEGATIVE 02/21/2019 1810   KETONESUR NEGATIVE 02/21/2019 1810   PROTEINUR 30 (A) 02/21/2019 1810   UROBILINOGEN 1.0 12/02/2010 1023   NITRITE NEGATIVE 02/21/2019 1810   LEUKOCYTESUR NEGATIVE 02/21/2019 1810   Sepsis Labs Invalid input(s): PROCALCITONIN,  WBC,  LACTICIDVEN Microbiology Recent Results (from the past 240 hour(s))  Respiratory Panel by RT PCR (Flu A&B, Covid) - Nasopharyngeal Swab     Status: None   Collection Time: 05/16/19  8:05 PM   Specimen: Nasopharyngeal Swab  Result Value Ref Range Status   SARS Coronavirus 2 by RT PCR NEGATIVE NEGATIVE Final    Comment: (NOTE) SARS-CoV-2 target nucleic acids are NOT DETECTED. The SARS-CoV-2 RNA is generally detectable in upper respiratoy specimens during the  acute phase of infection. The lowest concentration of SARS-CoV-2 viral copies this assay can detect is 131 copies/mL. A negative result does not preclude SARS-Cov-2 infection and should not be used as the sole basis for treatment or other patient management decisions. A negative result may occur with  improper specimen collection/handling, submission of specimen other than nasopharyngeal swab, presence of viral mutation(s) within the areas targeted by this assay, and inadequate number of viral copies (<131 copies/mL). A negative result must be combined with clinical observations, patient history, and epidemiological information. The expected result is Negative. Fact Sheet for Patients:  https://www.moore.com/ Fact Sheet for Healthcare Providers:  https://www.young.biz/ This test is not yet ap proved or cleared by the [place] FDA and  has been authorized for detection and/or diagnosis of SARS-CoV-2  by FDA under an Emergency Use Authorization (EUA). This EUA will remain  in effect (meaning this test can be used) for the duration of the COVID-19 declaration under Section 564(b)(1) of the Act, 21 U.S.C. section 360bbb-3(b)(1), unless the authorization is terminated or revoked sooner.    Influenza A by PCR NEGATIVE NEGATIVE Final   Influenza B by PCR NEGATIVE NEGATIVE Final    Comment: (NOTE) The Xpert Xpress SARS-CoV-2/FLU/RSV assay is intended as an aid in  the diagnosis of influenza from Nasopharyngeal swab specimens and  should not be used as a sole basis for treatment. Nasal washings and  aspirates are unacceptable for Xpert Xpress SARS-CoV-2/FLU/RSV  testing. Fact Sheet for Patients: https://www.moore.com/ Fact Sheet for Healthcare Providers: https://www.young.biz/ This test is not yet approved or cleared by the [place] FDA and  has been authorized for detection and/or diagnosis of SARS-CoV-2  by  FDA under an Emergency Use Authorization (EUA). This EUA will remain  in effect (meaning this test can be used) for the duration of the  Covid-19 declaration under Section 564(b)(1) of the Act, 21  U.S.C. section 360bbb-3(b)(1), unless the authorization is  terminated or revoked. Performed at [facility], [address], [place], [place] [address]      Time coordinating discharge: 40 minutes  SIGNED:   Alba Cory, MD  [facility]

## 2019-06-09 ENCOUNTER — Telehealth: Payer: Self-pay

## 2019-06-09 NOTE — Telephone Encounter (Signed)
I think the majority of her dyspnea is COPD related (when in the hospital diureses until creatinine almost doubled and she still had shortness of breath).    Nonetheless, with her 3 pound weight gain, have her take lasix 20mg  twice a day for the next 3 days.   , MD

## 2019-06-09 NOTE — Telephone Encounter (Signed)
Attempted to call patient. Mailbox is full, unable to leave message.  Will try again later.

## 2019-06-09 NOTE — Telephone Encounter (Signed)
The patient called directly in to Triage to complain of SOB and swelling. She was hospitalized in March for CHF. She was discharged 3/26. Since discharge, she has gained about 3 pounds.  Her swelling is only in her feet and ankles at this time, but it's "pretty bad." She cannot see her angles and her shoes do not fit.  She is currently using her home O2 but still has DOE. She has to take a break on the walk to the bathroom and kitchen to catch her breath. She wants to know how much Lasix she can take. She is currently taking Lasix 20 mg daily. Her last creatinine was 1.53.  She is currently resting and in no distress. She has no VS to report.  She states she is strictly limiting her salt intake.  Encouraged her to elevate legs when sitting.  She understands she will be called with Dr. Anne Fu' recommendations.

## 2019-06-09 NOTE — Telephone Encounter (Signed)
Spoke with patient who is aware to take Furosemide 20 mg BID for 3 days.  She is aware to elevate feet as much as possible and to cut out as much salt as possible.  She does admit to eating ham at [place].  She is aware to not consume these types of foods.  She will c/b if no improvement.

## 2019-06-09 NOTE — Telephone Encounter (Signed)
Patient returning call.

## 2019-06-12 ENCOUNTER — Telehealth: Payer: Self-pay | Admitting: Cardiology

## 2019-06-12 NOTE — Telephone Encounter (Signed)
Returned call back to pt.  She was once scheduled to see Dr. Anne Fu tomorrow, 06/13/2019.  Pt can't remember but said that she could have cancelled it, since it has been cancelled, but states that her sister is seeing Dr. Anne Fu tomorrow at 9:20 and she really needed to see him as well due to her having some swelling. I advised [name] wasn't in the office but I would send a message to her an Dr. Anne Fu to see if she could be worked in Advertising account executive, somewhere during the time frame of her sisters appt, and someone would call her back as soon as they get a response.   Please advise!

## 2019-06-12 NOTE — Telephone Encounter (Signed)
Called pt to let her know that Dr. Anne Fu gave the ok to add her to the 8:20 appt slot.  Pt states that she doesn't drive and [place] and 8:20 would be too early for her.

## 2019-06-12 NOTE — Telephone Encounter (Signed)
She can be added onto the 8:20 AM space.  Donato Schultz, MD

## 2019-06-12 NOTE — Telephone Encounter (Signed)
Gabrielle [name] is calling requesting Gabrielle [name] give her a call.

## 2019-06-13 ENCOUNTER — Ambulatory Visit: Payer: Medicare Other | Admitting: Cardiology

## 2019-06-27 NOTE — Telephone Encounter (Signed)
Called to f/u with pt regarding her appt and need to be seen sooner than 5/10 as scheduled.  Pt states she probably does need to be seen sooner but she doesn't have transportation and she doesn't want to go to the hospital.  She feels is she comes in to see Dr Ennis Forts will put her in the hospital.  She reports there is a NP who is coming to see her every 2 weeks now.  Pt admits she had not been taking her Furosemide because it caused her to "pee too much, up all night."  Pt reports BP has been elevated, she is not sleeping well and she has had swelling at her feet and ankles.  Advised pt these are all s/s of CHF.  She reports NP has her taking her medications as RXed now.  She took Furosemide yesterday and is now down 3 lbs.  Her right ankle edema is improving - left she about the same.  She will continue to take meds as ordered, elevate her feet above the level of her heart, monitor NA+ intake and continue to weigh daily.  She will c/b before her scheduled appt on 5/10 if she has questions or concerns.

## 2019-06-28 ENCOUNTER — Telehealth: Payer: Self-pay | Admitting: Cardiology

## 2019-06-28 NOTE — Telephone Encounter (Signed)
New Message:     Pt wants Dr Anne Fu to order oxygen for her please. She said her insurance will pay for it, if Dr Anne Fu order it.

## 2019-06-28 NOTE — Telephone Encounter (Signed)
Spoke with patient who is requesting 02 but ordered.  She states she had it while in the hospital and it helped her breath.  She has a tank at home but is wanting a shoulder machine so that she can use it while out of the house.  Advised since Dr Anne Fu did not order the 02 she will need to contact Dr Abigail Miyamoto (if he ordered) to request a shoulder unit.  Pt states understanding.  She also reports since the NP was there to see her 3 days ago, she has been taking Furosemide daily as instructed.   Her wt is down from 201 lbs to 194 lbs today.  She reports she feels less SOB and her feet and ankles aren't swollen as badly.  She will continue to weight daily and take medications as instructed.  She will c/b with any further questions or needs.

## 2019-07-10 ENCOUNTER — Ambulatory Visit: Payer: Medicare Other | Admitting: Cardiology

## 2019-07-18 ENCOUNTER — Other Ambulatory Visit: Payer: Self-pay | Admitting: Cardiology

## 2019-07-25 ENCOUNTER — Other Ambulatory Visit: Payer: Self-pay

## 2019-07-25 ENCOUNTER — Ambulatory Visit (INDEPENDENT_AMBULATORY_CARE_PROVIDER_SITE_OTHER): Payer: Medicare Other | Admitting: Cardiology

## 2019-07-25 ENCOUNTER — Encounter: Payer: Self-pay | Admitting: Cardiology

## 2019-07-25 VITALS — BP 120/70 | HR 82 | Ht 62.5 in | Wt 190.0 lb

## 2019-07-25 DIAGNOSIS — Z950 Presence of cardiac pacemaker: Secondary | ICD-10-CM

## 2019-07-25 DIAGNOSIS — I2510 Atherosclerotic heart disease of native coronary artery without angina pectoris: Secondary | ICD-10-CM | POA: Diagnosis not present

## 2019-07-25 DIAGNOSIS — I2583 Coronary atherosclerosis due to lipid rich plaque: Secondary | ICD-10-CM

## 2019-07-25 DIAGNOSIS — I5032 Chronic diastolic (congestive) heart failure: Secondary | ICD-10-CM

## 2019-07-25 DIAGNOSIS — Z79899 Other long term (current) drug therapy: Secondary | ICD-10-CM

## 2019-07-25 NOTE — Progress Notes (Signed)
Cardiology Office Note:    Date:  07/25/2019   ID:  Eufemia, Prindle [birth year], MRN [id number]  PCP:  Henrine Screws, MD  Cardiologist:  Donato Schultz, MD  Electrophysiologist:  None   Referring MD: Henrine Screws, MD     History of Present Illness:    Gabrielle [name] is a 74 y.o. female here for follow-up of progressive shortness of breath.  Has both chronic diastolic heart failure as well as COPD.  Last hospitalization, we almost doubled her creatinine from 1.03 up to 1.84 and BUN increased as well.  She seemed to be prerenal with IV Lasix.  This made me wonder if the majority of her shortness of breath was actually more COPD related.  Now taking one pill a day of lasix 20mg .  Xanax at night to sleep.   Past Medical History:  Diagnosis Date  . Anemia   . Arthritis   . Asthma   . Atrial fibrillation with controlled ventricular response (HCC) 11/2009   a. not on anticoagulation due to recurrent GI bleeding while on Eliquis.  . Chronic bronchitis   . Chronic diastolic CHF (congestive heart failure) (HCC)    a. Echo 12/2015: EF 55-60% w/ Grade 3 DD  . Chronic headache   . COPD (chronic obstructive pulmonary disease) (HCC)   . Depression   . Heart attack (HCC)   . Hyperlipidemia   . Hypertension     Past Surgical History:  Procedure Laterality Date  . CAD-CABG     x7, brief post-op Atrial Fib  . CESAREAN SECTION     x2  . COLONOSCOPY WITH PROPOFOL N/A 10/24/2015   Procedure: COLONOSCOPY WITH PROPOFOL;  Surgeon: 10/26/2015, MD;  Location: [facility] ENDOSCOPY;  Service: Endoscopy;  Laterality: N/A;  . CORONARY ARTERY BYPASS GRAFT  2011  . EP IMPLANTABLE DEVICE N/A 12/26/2015   Procedure: Pacemaker Implant;  Surgeon: 12/28/2015, MD;  Location: [facility] INVASIVE CV LAB;  Service: Cardiovascular;  Laterality: N/A;  . ESOPHAGOGASTRODUODENOSCOPY N/A 10/22/2015   Procedure: ESOPHAGOGASTRODUODENOSCOPY (EGD);  Surgeon: 10/24/2015, MD;  Location: [facility] ENDOSCOPY;  Service:  Endoscopy;  Laterality: N/A;  . ESOPHAGOGASTRODUODENOSCOPY N/A 12/24/2015   Procedure: ESOPHAGOGASTRODUODENOSCOPY (EGD);  Surgeon: 12/26/2015, MD;  Location: [facility] ENDOSCOPY;  Service: Endoscopy;  Laterality: N/A;  . JOINT REPLACEMENT Bilateral   . TONSILLECTOMY    . TOTAL KNEE ARTHROPLASTY      Current Medications: Current Meds  Medication Sig  . Acetaminophen (TYLENOL 8 HOUR ARTHRITIS PAIN PO) Take 2 tablets by mouth 2 (two) times daily. Back pain  . albuterol (PROVENTIL HFA;VENTOLIN HFA) 108 (90 Base) MCG/ACT inhaler Inhale 2 puffs into the lungs every 6 (six) hours as needed for wheezing or shortness of breath.  . ALPRAZolam (XANAX) 0.25 MG tablet Take 1 tablet (0.25 mg total) by mouth at bedtime as needed for anxiety.  Lucien Mons atorvastatin (LIPITOR) 20 MG tablet TAKE 1 TABLET BY MOUTH EVERY DAY AT 6PM  . azithromycin (ZITHROMAX) 250 MG tablet Take 1 tablet (250 mg total) by mouth daily.  . budesonide-formoterol (SYMBICORT) 160-4.5 MCG/ACT inhaler Inhale 2 puffs into the lungs 2 (two) times daily.  . citalopram (CELEXA) 20 MG tablet Take 40 mg by mouth daily.   . clobetasol cream (TEMOVATE) 0.05 % Apply 1 application topically 2 (two) times daily. Affected skin  . COSENTYX SENSOREADY, 300 MG, 150 MG/ML SOAJ Inject 2 Syringes into the skin every 28 (twenty-eight) days.  . diclofenac sodium (VOLTAREN) 1 % GEL Apply 4 g  topically 4 (four) times daily.  Marland Kitchen diltiazem (TIAZAC) 120 MG 24 hr capsule TAKE 1 CAPSULE BY MOUTH DAILY  . docusate sodium (COLACE) 100 MG capsule Take 100 mg by mouth 2 (two) times daily as needed for moderate constipation.   . fluticasone (FLONASE) 50 MCG/ACT nasal spray Place 2 sprays into both nostrils daily.  . furosemide (LASIX) 20 MG tablet TAKE 1 TABLET BY MOUTH DAILY  . levalbuterol (XOPENEX) 0.63 MG/3ML nebulizer solution Take 3 mLs (0.63 mg total) by nebulization 3 (three) times daily.  . Melatonin 10 MG TABS Take 10 mg by mouth at bedtime.  . metoprolol tartrate  (LOPRESSOR) 50 MG tablet Take 1.5 tablets (75 mg total) by mouth 2 (two) times daily.  Marland Kitchen nystatin (MYCOSTATIN) 100000 UNIT/ML suspension Take 5 mLs (500,000 Units total) by mouth 4 (four) times daily.  . pantoprazole (PROTONIX) 40 MG tablet Take 1 tablet (40 mg total) by mouth daily.  . potassium chloride SA (KLOR-CON) 10 MEQ tablet Take 1 tablet (10 mEq total) by mouth daily.  . predniSONE (DELTASONE) 20 MG tablet Take 2 tablets (40 mg total) by mouth daily with breakfast.  . sucralfate (CARAFATE) 1 g tablet Take 1 g by mouth 3 (three) times daily before meals.  . tiotropium (SPIRIVA) 18 MCG inhalation capsule Place 18 mcg into inhaler and inhale daily.     Allergies:   Novocain [procaine] and Penicillins   Social History   Socioeconomic History  . Marital status: Divorced    Spouse name: Not on file  . Number of children: Not on file  . Years of education: Not on file  . Highest education level: Not on file  Occupational History  . 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 Smoking status: Current Some Day Smoker    Packs/day: 0.20    Types: Cigarettes  . Smokeless tobacco: Never Used  . Tobacco comment: "Occasionally I will smoke a cigarette"   Substance and Sexual Activity  . Alcohol use: No    Alcohol/week: 0.0 standard drinks  . Drug use: No  . Sexual activity: Not on file  Other Topics Concern  . Not on file  Social History Narrative  . Not on file   Social Determinants of Health   Financial Resource Strain:   . Difficulty of Paying Living Expenses:   Food Insecurity:   . Worried About Programme researcher, broadcasting/film/video in the Last Year:   . Barista in the Last Year:   Transportation Needs:   . Freight forwarder (Medical):   Marland Kitchen Lack of Transportation (Non-Medical):   Physical Activity:   . Days of Exercise per Week:   . Minutes of Exercise per Session:   Stress:   . Feeling of Stress :   Social Connections:   . Frequency of  Communication with Friends and Family:   . Frequency of Social Gatherings with Friends and Family:   . Attends Religious Services:   . Active Member of Clubs or Organizations:   . Attends Banker Meetings:   Marland Kitchen Marital Status:      Family History: The patient's family history includes Diabetes in her mother; Heart attack in her father; Heart disease in her father and mother; Hypertension in her daughter, father, and mother; Lung cancer in her maternal grandfather and paternal grandfather; Stroke in her paternal grandmother.  ROS:   Please see the history of present illness.     All other  systems reviewed and are negative.  EKGs/Labs/Other Studies Reviewed:    The following studies were reviewed today: Norm EF  Recent Labs: 05/16/2019: B Natriuretic Peptide 1,338.5 05/23/2019: ALT 9; Magnesium 2.1 05/24/2019: TSH 1.352 05/26/2019: BUN 37; Creatinine, Ser 1.53; Hemoglobin 11.0; Platelets 198; Potassium 4.1; Sodium 138  Recent Lipid Panel    Component Value Date/Time   CHOL  11/28/2009 0343    112        ATP III CLASSIFICATION:  <200     mg/dL   Desirable  694-854  mg/dL   Borderline High  >=627    mg/dL   High          TRIG 82 02/21/2019 2212   HDL 52 11/28/2009 0343   CHOLHDL 2.2 11/28/2009 0343   VLDL 20 11/28/2009 0343   LDLCALC  11/28/2009 0343    40        Total Cholesterol/HDL:CHD Risk Coronary Heart Disease Risk Table                     Men   Women  1/2 Average Risk   3.4   3.3  Average Risk       5.0   4.4  2 X Average Risk   9.6   7.1  3 X Average Risk  23.4   11.0        Use the calculated Patient Ratio above and the CHD Risk Table to determine the patient's CHD Risk.        ATP III CLASSIFICATION (LDL):  <100     mg/dL   Optimal  035-009  mg/dL   Near or Above                    Optimal  130-159  mg/dL   Borderline  381-829  mg/dL   High  >937     mg/dL   Very High    Physical Exam:    VS:  BP 120/70   Pulse 82   Ht 5' 2.5" (1.588 m)    Wt 190 lb (86.2 kg)   LMP  (LMP Unknown)   SpO2 91%   BMI 34.20 kg/m     Wt Readings from Last 3 Encounters:  07/25/19 190 lb (86.2 kg)  05/26/19 187 lb 8 oz (85 kg)  04/10/19 200 lb (90.7 kg)     GEN:  Well nourished, well developed in no acute distress wheelchair HEENT: Normal NECK: No JVD; No carotid bruits LYMPHATICS: No lymphadenopathy CARDIAC: RRR, 2/6 systolic murmur, rubs, gallops RESPIRATORY:  Clear to auscultation without rales, wheezing or rhonchi  ABDOMEN: Soft, non-tender, non-distended MUSCULOSKELETAL:  No edema; No deformity  SKIN: Warm and dry NEUROLOGIC:  Alert and oriented x 3 PSYCHIATRIC:  Normal affect   ASSESSMENT:    1. Chronic diastolic heart failure (HCC)   2. Medication management   3. Coronary artery disease due to lipid rich plaque   4. Pacemaker    PLAN:    In order of problems listed above:  COPD  - on home O2.   Pacemaker  - Dr Ladona Ridgel.  Functioning well  Chronic diastolic heart failure -Playing a role with her shortness of breath as well as COPD.  Trying to encourage her to take her Lasix 20 mg a day.  She does have home health nurses come out every 2 weeks.  This helps. -Echo December 2020: 15. Technically difficult; definity used; mild apical hypokinesis; overall  preserved LV function; grade  2 diastolic dysfunction; mild LVE; severe  LAE; mild MR; right heart not well visualized.  Permanent atrial fibrillation  -Pacemaker for backup.  No anticoagulation because of prior bleeding.  She is not on aspirin.  She is on diltiazem and metoprolol for rate control.  Tobacco use -Continue to encourage cessation  Medication Adjustments/Labs and Tests Ordered: Current medicines are reviewed at length with the patient today.  Concerns regarding medicines are outlined above.  Orders Placed This Encounter  Procedures  . Basic metabolic panel   No orders of the defined types were placed in this encounter.   Patient Instructions   Medication Instructions:  The current medical regimen is effective;  continue present plan and medications.  *If you need a refill on your cardiac medications before your next appointment, please call your pharmacy*  Lab Work: Please have blood work today (BMP)  If you have labs (blood work) drawn today and your tests are completely normal, you will receive your results only by: Marland Kitchen MyChart Message (if you have MyChart) OR . A paper copy in the mail If you have any lab test that is abnormal or we need to change your treatment, we will call you to review the results.  Follow-Up: At [facility], you and your health needs are our priority.  As part of our continuing mission to provide you with exceptional heart care, we have created designated Provider Care Teams.  These Care Teams include your primary Cardiologist (physician) and Advanced Practice Providers (APPs -  Physician Assistants and Nurse Practitioners) who all work together to provide you with the care you need, when you need it.  We recommend signing up for the patient portal called "MyChart".  Sign up information is provided on this After Visit Summary.  MyChart is used to connect with patients for Virtual Visits (Telemedicine).  Patients are able to view lab/test results, encounter notes, upcoming appointments, etc.  Non-urgent messages can be sent to your provider as well.   To learn more about what you can do with MyChart, go to [email or web address].    Your next appointment:   4 month(s)  The format for your next appointment:   In Person  Provider:   Candee Furbish, MD  Thank you for choosing [facility]!!         Signed, Candee Furbish, MD  07/25/2019 4:08 PM    [place]

## 2019-07-25 NOTE — Patient Instructions (Signed)
Medication Instructions:  The current medical regimen is effective;  continue present plan and medications.  *If you need a refill on your cardiac medications before your next appointment, please call your pharmacy*  Lab Work: Please have blood work today (BMP) If you have labs (blood work) drawn today and your tests are completely normal, you will receive your results only by: . MyChart Message (if you have MyChart) OR . A paper copy in the mail If you have any lab test that is abnormal or we need to change your treatment, we will call you to review the results.  Follow-Up: At [facility], you and your health needs are our priority.  As part of our continuing mission to provide you with exceptional heart care, we have created designated Provider Care Teams.  These Care Teams include your primary Cardiologist (physician) and Advanced Practice Providers (APPs -  Physician Assistants and Nurse Practitioners) who all work together to provide you with the care you need, when you need it.  We recommend signing up for the patient portal called "MyChart".  Sign up information is provided on this After Visit Summary.  MyChart is used to connect with patients for Virtual Visits (Telemedicine).  Patients are able to view lab/test results, encounter notes, upcoming appointments, etc.  Non-urgent messages can be sent to your provider as well.   To learn more about what you can do with MyChart, go to [email or web address].mychart.com.    Your next appointment:   4 month(s)  The format for your next appointment:   In Person  Provider:   Mark Skains, MD   Thank you for choosing [facility]!!     

## 2019-07-26 ENCOUNTER — Telehealth: Payer: Self-pay | Admitting: Cardiology

## 2019-07-26 LAB — BASIC METABOLIC PANEL
BUN/Creatinine Ratio: 17 (ref 12–28)
BUN: 20 mg/dL (ref 8–27)
CO2: 23 mmol/L (ref 20–29)
Calcium: 10.2 mg/dL (ref 8.7–10.3)
Chloride: 105 mmol/L (ref 96–106)
Creatinine, Ser: 1.16 mg/dL — ABNORMAL HIGH (ref 0.57–1.00)
GFR calc Af Amer: 53 mL/min/{1.73_m2} — ABNORMAL LOW (ref >59)
GFR calc non Af Amer: 46 mL/min/{1.73_m2} — ABNORMAL LOW (ref >59)
Glucose: 118 mg/dL — ABNORMAL HIGH (ref 65–99)
Potassium: 4.8 mmol/L (ref 3.5–5.2)
Sodium: 144 mmol/L (ref 134–144)

## 2019-07-26 NOTE — Telephone Encounter (Signed)
Follow up  ° ° °Pt is returning call  ° ° °Please call back  °

## 2019-07-26 NOTE — Telephone Encounter (Signed)
New message ° ° °Patient is returning call for lab results. Please call. °

## 2019-07-26 NOTE — Telephone Encounter (Signed)
The patient has been notified of the lab result and verbalized understanding.  All questions (if any) were answered. Sigurd Sos, RN 07/26/2019 10:45 AM

## 2019-07-26 NOTE — Telephone Encounter (Signed)
I tried calling patient to give lab results, but no answer and mailbox full. 5/26

## 2019-08-08 ENCOUNTER — Ambulatory Visit (INDEPENDENT_AMBULATORY_CARE_PROVIDER_SITE_OTHER): Payer: Medicare Other | Admitting: *Deleted

## 2019-08-08 DIAGNOSIS — I495 Sick sinus syndrome: Secondary | ICD-10-CM | POA: Diagnosis not present

## 2019-08-08 LAB — CUP PACEART REMOTE DEVICE CHECK
Battery Remaining Longevity: 138 mo
Battery Remaining Percentage: 100 %
Brady Statistic RA Percent Paced: 12 %
Brady Statistic RV Percent Paced: 11 %
Date Time Interrogation Session: 20210608001200
Implantable Lead Implant Date: 20171026
Implantable Lead Implant Date: 20171026
Implantable Lead Location: 753859
Implantable Lead Location: 753860
Implantable Lead Model: 7740
Implantable Lead Model: 7741
Implantable Lead Serial Number: 685941
Implantable Lead Serial Number: 818382
Implantable Pulse Generator Implant Date: 20171026
Lead Channel Impedance Value: 1146 Ohm
Lead Channel Impedance Value: 709 Ohm
Lead Channel Pacing Threshold Amplitude: 0.7 V
Lead Channel Pacing Threshold Amplitude: 1.5 V
Lead Channel Pacing Threshold Pulse Width: 0.4 ms
Lead Channel Pacing Threshold Pulse Width: 0.4 ms
Lead Channel Setting Pacing Amplitude: 2 V
Lead Channel Setting Pacing Amplitude: 2.4 V
Lead Channel Setting Pacing Pulse Width: 0.4 ms
Lead Channel Setting Sensing Sensitivity: 2.5 mV
Pulse Gen Serial Number: 766992

## 2019-08-09 NOTE — Progress Notes (Signed)
Remote pacemaker transmission.   

## 2019-08-16 ENCOUNTER — Other Ambulatory Visit: Payer: Self-pay

## 2019-08-16 MED ORDER — POTASSIUM CHLORIDE CRYS ER 10 MEQ PO TBCR: 10.0000 meq | EXTENDED_RELEASE_TABLET | Freq: Every day | ORAL | 11 refills | Status: AC

## 2019-08-16 MED ORDER — PANTOPRAZOLE SODIUM 40 MG PO TBEC: 40.0000 mg | DELAYED_RELEASE_TABLET | Freq: Every day | ORAL | 11 refills | Status: AC

## 2019-08-24 ENCOUNTER — Other Ambulatory Visit: Payer: Self-pay

## 2019-08-24 MED ORDER — COSENTYX SENSOREADY (300 MG) 150 MG/ML ~~LOC~~ SOAJ: 2.0000 | SUBCUTANEOUS | 3 refills | Status: DC

## 2019-08-24 NOTE — Telephone Encounter (Signed)
PATIENT DUE FOR TB December 13 2019.

## 2019-11-07 ENCOUNTER — Ambulatory Visit (INDEPENDENT_AMBULATORY_CARE_PROVIDER_SITE_OTHER): Payer: Medicare Other | Admitting: *Deleted

## 2019-11-07 DIAGNOSIS — I495 Sick sinus syndrome: Secondary | ICD-10-CM | POA: Diagnosis not present

## 2019-11-07 LAB — CUP PACEART REMOTE DEVICE CHECK
Battery Remaining Longevity: 132 mo
Battery Remaining Percentage: 100 %
Brady Statistic RA Percent Paced: 18 %
Brady Statistic RV Percent Paced: 8 %
Date Time Interrogation Session: 20210907001100
Implantable Lead Implant Date: 20171026
Implantable Lead Implant Date: 20171026
Implantable Lead Location: 753859
Implantable Lead Location: 753860
Implantable Lead Model: 7740
Implantable Lead Model: 7741
Implantable Lead Serial Number: 685941
Implantable Lead Serial Number: 818382
Implantable Pulse Generator Implant Date: 20171026
Lead Channel Impedance Value: 1238 Ohm
Lead Channel Impedance Value: 752 Ohm
Lead Channel Pacing Threshold Amplitude: 0.7 V
Lead Channel Pacing Threshold Amplitude: 1.5 V
Lead Channel Pacing Threshold Pulse Width: 0.4 ms
Lead Channel Pacing Threshold Pulse Width: 0.4 ms
Lead Channel Setting Pacing Amplitude: 2 V
Lead Channel Setting Pacing Amplitude: 2.4 V
Lead Channel Setting Pacing Pulse Width: 0.4 ms
Lead Channel Setting Sensing Sensitivity: 2.5 mV
Pulse Gen Serial Number: 766992

## 2019-11-08 NOTE — Progress Notes (Signed)
Remote pacemaker transmission.   

## 2019-11-20 ENCOUNTER — Ambulatory Visit: Payer: Medicare Other | Admitting: Cardiology

## 2019-11-21 ENCOUNTER — Other Ambulatory Visit: Payer: Self-pay | Admitting: Cardiology

## 2019-12-08 ENCOUNTER — Other Ambulatory Visit: Payer: Self-pay | Admitting: Dermatology

## 2020-01-09 ENCOUNTER — Other Ambulatory Visit: Payer: Self-pay

## 2020-01-09 ENCOUNTER — Encounter (HOSPITAL_COMMUNITY): Payer: Self-pay | Admitting: Emergency Medicine

## 2020-01-09 ENCOUNTER — Emergency Department (HOSPITAL_COMMUNITY): Payer: Medicare Other

## 2020-01-09 ENCOUNTER — Inpatient Hospital Stay (HOSPITAL_COMMUNITY)
Admission: EM | Admit: 2020-01-09 | Discharge: 2020-01-31 | DRG: 291 | Disposition: E | Payer: Medicare Other | Attending: Pulmonary Disease | Admitting: Pulmonary Disease

## 2020-01-09 DIAGNOSIS — Z880 Allergy status to penicillin: Secondary | ICD-10-CM

## 2020-01-09 DIAGNOSIS — Z823 Family history of stroke: Secondary | ICD-10-CM

## 2020-01-09 DIAGNOSIS — I469 Cardiac arrest, cause unspecified: Secondary | ICD-10-CM | POA: Diagnosis not present

## 2020-01-09 DIAGNOSIS — Z515 Encounter for palliative care: Secondary | ICD-10-CM

## 2020-01-09 DIAGNOSIS — E785 Hyperlipidemia, unspecified: Secondary | ICD-10-CM | POA: Diagnosis present

## 2020-01-09 DIAGNOSIS — I2721 Secondary pulmonary arterial hypertension: Secondary | ICD-10-CM | POA: Diagnosis present

## 2020-01-09 DIAGNOSIS — R197 Diarrhea, unspecified: Secondary | ICD-10-CM

## 2020-01-09 DIAGNOSIS — N1831 Chronic kidney disease, stage 3a: Secondary | ICD-10-CM | POA: Diagnosis present

## 2020-01-09 DIAGNOSIS — J9600 Acute respiratory failure, unspecified whether with hypoxia or hypercapnia: Secondary | ICD-10-CM | POA: Diagnosis present

## 2020-01-09 DIAGNOSIS — Z7189 Other specified counseling: Secondary | ICD-10-CM

## 2020-01-09 DIAGNOSIS — Z884 Allergy status to anesthetic agent status: Secondary | ICD-10-CM

## 2020-01-09 DIAGNOSIS — F419 Anxiety disorder, unspecified: Secondary | ICD-10-CM | POA: Diagnosis present

## 2020-01-09 DIAGNOSIS — I480 Paroxysmal atrial fibrillation: Secondary | ICD-10-CM | POA: Diagnosis not present

## 2020-01-09 DIAGNOSIS — R6510 Systemic inflammatory response syndrome (SIRS) of non-infectious origin without acute organ dysfunction: Secondary | ICD-10-CM

## 2020-01-09 DIAGNOSIS — I509 Heart failure, unspecified: Secondary | ICD-10-CM

## 2020-01-09 DIAGNOSIS — I252 Old myocardial infarction: Secondary | ICD-10-CM

## 2020-01-09 DIAGNOSIS — D631 Anemia in chronic kidney disease: Secondary | ICD-10-CM | POA: Diagnosis present

## 2020-01-09 DIAGNOSIS — E872 Acidosis: Secondary | ICD-10-CM | POA: Diagnosis not present

## 2020-01-09 DIAGNOSIS — I110 Hypertensive heart disease with heart failure: Secondary | ICD-10-CM | POA: Diagnosis present

## 2020-01-09 DIAGNOSIS — Z66 Do not resuscitate: Secondary | ICD-10-CM | POA: Diagnosis not present

## 2020-01-09 DIAGNOSIS — Z885 Allergy status to narcotic agent status: Secondary | ICD-10-CM

## 2020-01-09 DIAGNOSIS — Z452 Encounter for adjustment and management of vascular access device: Secondary | ICD-10-CM

## 2020-01-09 DIAGNOSIS — I959 Hypotension, unspecified: Secondary | ICD-10-CM | POA: Diagnosis not present

## 2020-01-09 DIAGNOSIS — R0902 Hypoxemia: Secondary | ICD-10-CM

## 2020-01-09 DIAGNOSIS — I4821 Permanent atrial fibrillation: Secondary | ICD-10-CM | POA: Diagnosis present

## 2020-01-09 DIAGNOSIS — I5033 Acute on chronic diastolic (congestive) heart failure: Secondary | ICD-10-CM | POA: Diagnosis present

## 2020-01-09 DIAGNOSIS — R680 Hypothermia, not associated with low environmental temperature: Secondary | ICD-10-CM | POA: Diagnosis not present

## 2020-01-09 DIAGNOSIS — T380X5A Adverse effect of glucocorticoids and synthetic analogues, initial encounter: Secondary | ICD-10-CM | POA: Diagnosis not present

## 2020-01-09 DIAGNOSIS — F32A Depression, unspecified: Secondary | ICD-10-CM | POA: Diagnosis present

## 2020-01-09 DIAGNOSIS — I130 Hypertensive heart and chronic kidney disease with heart failure and stage 1 through stage 4 chronic kidney disease, or unspecified chronic kidney disease: Principal | ICD-10-CM | POA: Diagnosis present

## 2020-01-09 DIAGNOSIS — Z9981 Dependence on supplemental oxygen: Secondary | ICD-10-CM

## 2020-01-09 DIAGNOSIS — F17210 Nicotine dependence, cigarettes, uncomplicated: Secondary | ICD-10-CM | POA: Diagnosis present

## 2020-01-09 DIAGNOSIS — Z6836 Body mass index (BMI) 36.0-36.9, adult: Secondary | ICD-10-CM

## 2020-01-09 DIAGNOSIS — Z801 Family history of malignant neoplasm of trachea, bronchus and lung: Secondary | ICD-10-CM

## 2020-01-09 DIAGNOSIS — I4891 Unspecified atrial fibrillation: Secondary | ICD-10-CM | POA: Diagnosis present

## 2020-01-09 DIAGNOSIS — R7303 Prediabetes: Secondary | ICD-10-CM

## 2020-01-09 DIAGNOSIS — R0602 Shortness of breath: Secondary | ICD-10-CM | POA: Diagnosis not present

## 2020-01-09 DIAGNOSIS — E669 Obesity, unspecified: Secondary | ICD-10-CM | POA: Diagnosis present

## 2020-01-09 DIAGNOSIS — N179 Acute kidney failure, unspecified: Secondary | ICD-10-CM | POA: Diagnosis not present

## 2020-01-09 DIAGNOSIS — E1165 Type 2 diabetes mellitus with hyperglycemia: Secondary | ICD-10-CM | POA: Diagnosis not present

## 2020-01-09 DIAGNOSIS — Z951 Presence of aortocoronary bypass graft: Secondary | ICD-10-CM

## 2020-01-09 DIAGNOSIS — Z79899 Other long term (current) drug therapy: Secondary | ICD-10-CM

## 2020-01-09 DIAGNOSIS — Z96653 Presence of artificial knee joint, bilateral: Secondary | ICD-10-CM | POA: Diagnosis present

## 2020-01-09 DIAGNOSIS — Z950 Presence of cardiac pacemaker: Secondary | ICD-10-CM

## 2020-01-09 DIAGNOSIS — E1122 Type 2 diabetes mellitus with diabetic chronic kidney disease: Secondary | ICD-10-CM | POA: Diagnosis present

## 2020-01-09 DIAGNOSIS — J9622 Acute and chronic respiratory failure with hypercapnia: Secondary | ICD-10-CM | POA: Diagnosis present

## 2020-01-09 DIAGNOSIS — I2510 Atherosclerotic heart disease of native coronary artery without angina pectoris: Secondary | ICD-10-CM | POA: Diagnosis present

## 2020-01-09 DIAGNOSIS — Z833 Family history of diabetes mellitus: Secondary | ICD-10-CM

## 2020-01-09 DIAGNOSIS — Z8249 Family history of ischemic heart disease and other diseases of the circulatory system: Secondary | ICD-10-CM

## 2020-01-09 DIAGNOSIS — Z20822 Contact with and (suspected) exposure to covid-19: Secondary | ICD-10-CM | POA: Diagnosis present

## 2020-01-09 DIAGNOSIS — I495 Sick sinus syndrome: Secondary | ICD-10-CM | POA: Diagnosis present

## 2020-01-09 DIAGNOSIS — M1990 Unspecified osteoarthritis, unspecified site: Secondary | ICD-10-CM | POA: Diagnosis present

## 2020-01-09 DIAGNOSIS — J441 Chronic obstructive pulmonary disease with (acute) exacerbation: Secondary | ICD-10-CM | POA: Diagnosis present

## 2020-01-09 DIAGNOSIS — F0391 Unspecified dementia with behavioral disturbance: Secondary | ICD-10-CM | POA: Diagnosis present

## 2020-01-09 DIAGNOSIS — I2583 Coronary atherosclerosis due to lipid rich plaque: Secondary | ICD-10-CM | POA: Diagnosis present

## 2020-01-09 DIAGNOSIS — Z7951 Long term (current) use of inhaled steroids: Secondary | ICD-10-CM

## 2020-01-09 DIAGNOSIS — N183 Chronic kidney disease, stage 3 unspecified: Secondary | ICD-10-CM

## 2020-01-09 DIAGNOSIS — J9621 Acute and chronic respiratory failure with hypoxia: Secondary | ICD-10-CM | POA: Diagnosis present

## 2020-01-09 HISTORY — DX: Heart failure, unspecified: I50.9

## 2020-01-09 HISTORY — DX: Acute kidney failure, unspecified: N17.9

## 2020-01-09 HISTORY — DX: Long term (current) use of anticoagulants: Z79.01

## 2020-01-09 HISTORY — DX: Hypotension, unspecified: I95.9

## 2020-01-09 LAB — URINALYSIS, ROUTINE W REFLEX MICROSCOPIC
Bacteria, UA: NONE SEEN
Bilirubin Urine: NEGATIVE
Glucose, UA: NEGATIVE mg/dL
Ketones, ur: NEGATIVE mg/dL
Nitrite: NEGATIVE
Protein, ur: NEGATIVE mg/dL
Specific Gravity, Urine: 1.015 (ref 1.005–1.030)
pH: 5 (ref 5.0–8.0)

## 2020-01-09 LAB — COMPREHENSIVE METABOLIC PANEL
ALT: 7 U/L (ref 0–44)
AST: 17 U/L (ref 15–41)
Albumin: 3.7 g/dL (ref 3.5–5.0)
Alkaline Phosphatase: 46 U/L (ref 38–126)
Anion gap: 8 (ref 5–15)
BUN: 16 mg/dL (ref 8–23)
CO2: 34 mmol/L — ABNORMAL HIGH (ref 22–32)
Calcium: 9.5 mg/dL (ref 8.9–10.3)
Chloride: 95 mmol/L — ABNORMAL LOW (ref 98–111)
Creatinine, Ser: 1.27 mg/dL — ABNORMAL HIGH (ref 0.44–1.00)
GFR, Estimated: 44 mL/min — ABNORMAL LOW (ref >60)
Glucose, Bld: 124 mg/dL — ABNORMAL HIGH (ref 70–99)
Potassium: 4.9 mmol/L (ref 3.5–5.1)
Sodium: 137 mmol/L (ref 135–145)
Total Bilirubin: 0.7 mg/dL (ref 0.3–1.2)
Total Protein: 6.7 g/dL (ref 6.5–8.1)

## 2020-01-09 LAB — I-STAT ARTERIAL BLOOD GAS, ED
Acid-Base Excess: 9 mmol/L — ABNORMAL HIGH (ref 0.0–2.0)
Bicarbonate: 37.4 mmol/L — ABNORMAL HIGH (ref 20.0–28.0)
Calcium, Ion: 1.28 mmol/L (ref 1.15–1.40)
HCT: 36 % (ref 36.0–46.0)
Hemoglobin: 12.2 g/dL (ref 12.0–15.0)
O2 Saturation: 93 %
Patient temperature: 97.8
Potassium: 4.5 mmol/L (ref 3.5–5.1)
Sodium: 140 mmol/L (ref 135–145)
TCO2: 40 mmol/L — ABNORMAL HIGH (ref 22–32)
pCO2 arterial: 71.5 mmHg (ref 32.0–48.0)
pH, Arterial: 7.324 — ABNORMAL LOW (ref 7.350–7.450)
pO2, Arterial: 76 mmHg — ABNORMAL LOW (ref 83.0–108.0)

## 2020-01-09 LAB — TROPONIN I (HIGH SENSITIVITY)
Troponin I (High Sensitivity): 11 ng/L (ref <18)
Troponin I (High Sensitivity): 10 ng/L (ref <18)

## 2020-01-09 LAB — CBC WITH DIFFERENTIAL/PLATELET
Abs Immature Granulocytes: 0.08 10*3/uL — ABNORMAL HIGH (ref 0.00–0.07)
Basophils Absolute: 0 10*3/uL (ref 0.0–0.1)
Basophils Relative: 0 %
Eosinophils Absolute: 0.2 10*3/uL (ref 0.0–0.5)
Eosinophils Relative: 1 %
HCT: 37.6 % (ref 36.0–46.0)
Hemoglobin: 10.5 g/dL — ABNORMAL LOW (ref 12.0–15.0)
Immature Granulocytes: 1 %
Lymphocytes Relative: 15 %
Lymphs Abs: 1.9 10*3/uL (ref 0.7–4.0)
MCH: 26.1 pg (ref 26.0–34.0)
MCHC: 27.9 g/dL — ABNORMAL LOW (ref 30.0–36.0)
MCV: 93.3 fL (ref 80.0–100.0)
Monocytes Absolute: 1.4 10*3/uL — ABNORMAL HIGH (ref 0.1–1.0)
Monocytes Relative: 11 %
Neutro Abs: 9.3 10*3/uL — ABNORMAL HIGH (ref 1.7–7.7)
Neutrophils Relative %: 72 %
Platelets: 210 10*3/uL (ref 150–400)
RBC: 4.03 MIL/uL (ref 3.87–5.11)
RDW: 15.5 % (ref 11.5–15.5)
WBC: 12.8 10*3/uL — ABNORMAL HIGH (ref 4.0–10.5)
nRBC: 0 % (ref 0.0–0.2)

## 2020-01-09 LAB — I-STAT VENOUS BLOOD GAS, ED
Acid-Base Excess: 10 mmol/L — ABNORMAL HIGH (ref 0.0–2.0)
Bicarbonate: 36.9 mmol/L — ABNORMAL HIGH (ref 20.0–28.0)
Calcium, Ion: 1.14 mmol/L — ABNORMAL LOW (ref 1.15–1.40)
HCT: 37 % (ref 36.0–46.0)
Hemoglobin: 12.6 g/dL (ref 12.0–15.0)
O2 Saturation: 86 %
Potassium: 4.4 mmol/L (ref 3.5–5.1)
Sodium: 138 mmol/L (ref 135–145)
TCO2: 39 mmol/L — ABNORMAL HIGH (ref 22–32)
pCO2, Ven: 58.7 mmHg (ref 44.0–60.0)
pH, Ven: 7.406 (ref 7.250–7.430)
pO2, Ven: 53 mmHg — ABNORMAL HIGH (ref 32.0–45.0)

## 2020-01-09 LAB — RESPIRATORY PANEL BY RT PCR (FLU A&B, COVID)
Influenza A by PCR: NEGATIVE
Influenza B by PCR: NEGATIVE
SARS Coronavirus 2 by RT PCR: NEGATIVE

## 2020-01-09 LAB — BRAIN NATRIURETIC PEPTIDE: B Natriuretic Peptide: 471.2 pg/mL — ABNORMAL HIGH (ref 0.0–100.0)

## 2020-01-09 MED ORDER — PANTOPRAZOLE SODIUM 40 MG PO TBEC
40.0000 mg | DELAYED_RELEASE_TABLET | Freq: Every day | ORAL | Status: DC
  Administered 2020-01-10 – 2020-01-18 (×9): 40 mg via ORAL
  Filled 2020-01-09 (×10): qty 1

## 2020-01-09 MED ORDER — ALPRAZOLAM 0.25 MG PO TABS
0.2500 mg | ORAL_TABLET | Freq: Every evening | ORAL | Status: DC | PRN
  Administered 2020-01-10: 0.25 mg via ORAL
  Filled 2020-01-09: qty 1

## 2020-01-09 MED ORDER — SODIUM CHLORIDE 0.9% FLUSH
3.0000 mL | Freq: Two times a day (BID) | INTRAVENOUS | Status: DC
  Administered 2020-01-09 – 2020-01-18 (×18): 3 mL via INTRAVENOUS

## 2020-01-09 MED ORDER — DILTIAZEM HCL ER COATED BEADS 120 MG PO CP24
120.0000 mg | ORAL_CAPSULE | Freq: Every day | ORAL | Status: DC
  Administered 2020-01-10 – 2020-01-13 (×4): 120 mg via ORAL
  Filled 2020-01-09 (×4): qty 1

## 2020-01-09 MED ORDER — MELATONIN 5 MG PO TABS
10.0000 mg | ORAL_TABLET | Freq: Every day | ORAL | Status: DC
  Administered 2020-01-10 – 2020-01-17 (×8): 10 mg via ORAL
  Filled 2020-01-09 (×9): qty 2

## 2020-01-09 MED ORDER — ACETAMINOPHEN 650 MG RE SUPP: 650.0000 mg | Freq: Four times a day (QID) | RECTAL | Status: DC | PRN

## 2020-01-09 MED ORDER — ACETAMINOPHEN 325 MG PO TABS
650.0000 mg | ORAL_TABLET | Freq: Four times a day (QID) | ORAL | Status: DC | PRN
  Administered 2020-01-10 – 2020-01-15 (×11): 650 mg via ORAL
  Filled 2020-01-09 (×11): qty 2

## 2020-01-09 MED ORDER — ATORVASTATIN CALCIUM 10 MG PO TABS
20.0000 mg | ORAL_TABLET | Freq: Every evening | ORAL | Status: DC
  Administered 2020-01-09 – 2020-01-17 (×8): 20 mg via ORAL
  Filled 2020-01-09 (×8): qty 2

## 2020-01-09 MED ORDER — SUCRALFATE 1 G PO TABS
1.0000 g | ORAL_TABLET | Freq: Three times a day (TID) | ORAL | Status: DC
  Administered 2020-01-10 – 2020-01-18 (×23): 1 g via ORAL
  Filled 2020-01-09 (×25): qty 1

## 2020-01-09 MED ORDER — CITALOPRAM HYDROBROMIDE 20 MG PO TABS
40.0000 mg | ORAL_TABLET | Freq: Every day | ORAL | Status: DC
  Administered 2020-01-10 – 2020-01-18 (×9): 40 mg via ORAL
  Filled 2020-01-09 (×9): qty 2

## 2020-01-09 MED ORDER — ENOXAPARIN SODIUM 40 MG/0.4ML ~~LOC~~ SOLN
40.0000 mg | SUBCUTANEOUS | Status: DC
  Administered 2020-01-09 – 2020-01-17 (×9): 40 mg via SUBCUTANEOUS
  Filled 2020-01-09 (×8): qty 0.4

## 2020-01-09 MED ORDER — METHYLPREDNISOLONE SODIUM SUCC 125 MG IJ SOLR
125.0000 mg | Freq: Once | INTRAMUSCULAR | Status: AC
  Administered 2020-01-09: 125 mg via INTRAVENOUS
  Filled 2020-01-09: qty 2

## 2020-01-09 MED ORDER — FUROSEMIDE 10 MG/ML IJ SOLN
40.0000 mg | Freq: Once | INTRAMUSCULAR | Status: AC
  Administered 2020-01-09: 40 mg via INTRAVENOUS
  Filled 2020-01-09: qty 4

## 2020-01-09 MED ORDER — METOPROLOL TARTRATE 50 MG PO TABS
75.0000 mg | ORAL_TABLET | Freq: Two times a day (BID) | ORAL | Status: DC
  Administered 2020-01-09 – 2020-01-12 (×6): 75 mg via ORAL
  Filled 2020-01-09 (×7): qty 1
  Filled 2020-01-09: qty 3

## 2020-01-09 MED ORDER — UMECLIDINIUM BROMIDE 62.5 MCG/INH IN AEPB
1.0000 | INHALATION_SPRAY | Freq: Every day | RESPIRATORY_TRACT | Status: DC
  Filled 2020-01-09: qty 7

## 2020-01-09 MED ORDER — FUROSEMIDE 10 MG/ML IJ SOLN
40.0000 mg | Freq: Two times a day (BID) | INTRAMUSCULAR | Status: DC
  Filled 2020-01-09: qty 4

## 2020-01-09 MED ORDER — ALBUTEROL SULFATE (2.5 MG/3ML) 0.083% IN NEBU: 3.0000 mL | INHALATION_SOLUTION | Freq: Four times a day (QID) | RESPIRATORY_TRACT | Status: DC | PRN

## 2020-01-09 MED ORDER — MOMETASONE FURO-FORMOTEROL FUM 200-5 MCG/ACT IN AERO
2.0000 | INHALATION_SPRAY | Freq: Two times a day (BID) | RESPIRATORY_TRACT | Status: DC
  Filled 2020-01-09: qty 8.8

## 2020-01-09 MED ORDER — DOCUSATE SODIUM 100 MG PO CAPS
100.0000 mg | ORAL_CAPSULE | Freq: Two times a day (BID) | ORAL | Status: DC | PRN
  Administered 2020-01-15: 100 mg via ORAL
  Filled 2020-01-09: qty 1

## 2020-01-09 MED ORDER — ALBUTEROL SULFATE HFA 108 (90 BASE) MCG/ACT IN AERS
2.0000 | INHALATION_SPRAY | Freq: Once | RESPIRATORY_TRACT | Status: AC
  Administered 2020-01-09: 2 via RESPIRATORY_TRACT
  Filled 2020-01-09: qty 6.7

## 2020-01-09 NOTE — ED Triage Notes (Signed)
Pt to ED with c/o generalized weakness x's 1 week  Also c/o increased shortness of breath  Pt is on home 02 at 2 LPM Pt has hx of CHF   [facility] she has been taking her Lasix

## 2020-01-09 NOTE — ED Notes (Signed)
Vassie Loll would like an update. Please call [phone]

## 2020-01-09 NOTE — ED Notes (Signed)
Placed patient on purewick. Patient oxygen saturation noted to be 85-89 percent on 2l. Patient pulled up in bed.

## 2020-01-09 NOTE — ED Notes (Signed)
Patient up to bsc with one assist.

## 2020-01-09 NOTE — H&P (Signed)
History and Physical   Gabrielle [name] ZOX:[id number] DOB: [birth year] DOA: 02/07/20  PCP: Henrine Screws, MD   Patient coming from: Home  Chief Complaint: Shortness of breath, edema  HPI: Gabrielle [name] is a 74 y.o. female with medical history significant of COPD, heart failure with preserved ejection fraction, atrial fibrillation, anemia, QT prolongation, CAD (status post MI and CABG X7), hypertension, tachybradycardia syndrome status post pacemaker who presents with progressive dyspnea.  Patient states that her shortness of breath has been slowly worsening over the past 3 weeks.  She has remained short of breath despite using her home 2 L of oxygen for her COPD.  She reports dyspnea on minimal exertion.  And she also states that her weight has increased on her home scale from a dry weight of around 189 pounds to now 202 pounds.  She also reports orthopnea and recent diarrhea (though diarrhea is improving).  She denies headache, cough, fever, chest pain, abdominal pain, constipation, nausea, vomiting.  ED Course: Vital signs in the ED significant for saturation of 86% on her home 2 L.  Vital signs otherwise stable.  Lab work-up showed bicarb of 34, creatinine stable at 1.27, stable hemoglobin of 10.5 and stable leukocytosis of 12.  BNP elevated to 471 in the setting of obesity.  Initial troponin negative with repeat pending.  Respiratory panel for flu and Covid is pending.  ABG obtained on 5 L [place] showed pH 7.32 with PCO2 of 71.  Review of Systems: As per HPI otherwise all other systems reviewed and are negative.  Past Medical History:  Diagnosis Date  . Acute congestive heart failure (HCC)   . Acute lower UTI 07/01/2018  . Acute on chronic diastolic CHF (congestive heart failure) /EF 55 % 08/19/2016  . Acute renal failure (ARF) (HCC)   . Acute respiratory failure (HCC) 02/21/2019  . AKI (acute kidney injury) (HCC) 02/21/2019  . Anemia   . Anemia due to blood loss, acute 10/27/2015  .  Arterial hypotension   . Arthritis   . ARTHRITIS 12/13/2008   Qualifier: Diagnosis of  By: Excell Seltzer CMA, Lawson Fiscal    . Asthma   . Atrial fibrillation with controlled ventricular response (HCC) 11/2009   a. not on anticoagulation due to recurrent GI bleeding while on Eliquis.  . Atrial fibrillation with RVR (HCC) 07/02/2018  . Chronic anticoagulation   . Chronic bronchitis   . Chronic diastolic CHF (congestive heart failure) (HCC)    a. Echo 12/2015: EF 55-60% w/ Grade 3 DD  . Chronic headache   . COPD (chronic obstructive pulmonary disease) (HCC)   . Depression   . Heart attack (HCC)   . Hyperlipidemia   . Hypertension     Past Surgical History:  Procedure Laterality Date  . CAD-CABG     x7, brief post-op Atrial Fib  . CESAREAN SECTION     x2  . COLONOSCOPY WITH PROPOFOL N/A 10/24/2015   Procedure: COLONOSCOPY WITH PROPOFOL;  Surgeon: Carman Ching, MD;  Location: [facility] ENDOSCOPY;  Service: Endoscopy;  Laterality: N/A;  . CORONARY ARTERY BYPASS GRAFT  2011  . EP IMPLANTABLE DEVICE N/A 12/26/2015   Procedure: Pacemaker Implant;  Surgeon: Marinus Maw, MD;  Location: [facility] INVASIVE CV LAB;  Service: Cardiovascular;  Laterality: N/A;  . ESOPHAGOGASTRODUODENOSCOPY N/A 10/22/2015   Procedure: ESOPHAGOGASTRODUODENOSCOPY (EGD);  Surgeon: Carman Ching, MD;  Location: [facility] ENDOSCOPY;  Service: Endoscopy;  Laterality: N/A;  . ESOPHAGOGASTRODUODENOSCOPY N/A 12/24/2015   Procedure: ESOPHAGOGASTRODUODENOSCOPY (EGD);  Surgeon: Carman Ching, MD;  Location: [facility] ENDOSCOPY;  Service: Endoscopy;  Laterality: N/A;  . JOINT REPLACEMENT Bilateral   . TONSILLECTOMY    . TOTAL KNEE ARTHROPLASTY      Social History  reports that she has been smoking cigarettes. She has been smoking about 0.20 packs per day. She has never used smokeless tobacco. She reports that she does not drink alcohol and does not use drugs.  Allergies  Allergen Reactions  . Novocain [Procaine] Hives and Palpitations  . Penicillins Hives  and Other (See Comments)    Has patient had a PCN reaction causing immediate rash, facial/tongue/throat swelling, SOB or lightheadedness with hypotension: No Has patient had a PCN reaction causing severe rash involving mucus membranes or skin necrosis: No Has patient had a PCN reaction that required hospitalization No Has patient had a PCN reaction occurring within the last 10 years: No If all of the above answers are "NO", then may proceed with Cephalosporin use.    Family History  Problem Relation Age of Onset  . Diabetes Mother   . Hypertension Mother   . Heart disease Mother        before age 34  . Heart disease Father        before age 63  . Hypertension Father   . Heart attack Father   . Lung cancer Maternal Grandfather   . Lung cancer Paternal Grandfather   . Hypertension Daughter   . Stroke Paternal Grandmother   Reviewed on admission  Prior to Admission medications   Medication Sig Start Date End Date Taking? Authorizing Provider  albuterol (PROVENTIL HFA;VENTOLIN HFA) 108 (90 Base) MCG/ACT inhaler Inhale 2 puffs into the lungs every 6 (six) hours as needed for wheezing or shortness of breath.   Yes [provider]  ALPRAZolam (XANAX) 0.25 MG tablet Take 1 tablet (0.25 mg total) by mouth at bedtime as needed for anxiety. 05/22/19  Yes Dow Adolph N, DO  atorvastatin (LIPITOR) 20 MG tablet Take 1 tablet (20 mg total) by mouth daily. Patient taking differently: Take 20 mg by mouth every evening.  11/21/19  Yes Jake Bathe, MD  budesonide-formoterol (SYMBICORT) 160-4.5 MCG/ACT inhaler Inhale 2 puffs into the lungs 2 (two) times daily. 01/15/14  Yes Storm Frisk, MD  citalopram (CELEXA) 20 MG tablet Take 40 mg by mouth in the morning.  07/08/16  Yes [provider]  clobetasol cream (TEMOVATE) 0.05 % Apply 1 application topically 2 (two) times daily. Affected skin 05/21/16  Yes Vassie Loll, MD  diltiazem [facility]) 120 MG 24 hr capsule TAKE 1 CAPSULE BY  MOUTH DAILY Patient taking differently: Take 120 mg by mouth in the morning.  11/21/19  Yes Jake Bathe, MD  docusate sodium (COLACE) 100 MG capsule Take 100 mg by mouth 2 (two) times daily as needed for moderate constipation.    Yes [provider]  fluticasone (FLONASE) 50 MCG/ACT nasal spray Place 2 sprays into both nostrils daily. 12/21/15  Yes Albertine Grates, MD  furosemide (LASIX) 20 MG tablet TAKE 1 TABLET BY MOUTH DAILY Patient taking differently: Take 20 mg by mouth daily.  11/21/19  Yes Jake Bathe, MD  Melatonin 10 MG TABS Take 10 mg by mouth at bedtime.   Yes [provider]  metoprolol tartrate (LOPRESSOR) 50 MG tablet Take 1.5 tablets (75 mg total) by mouth 2 (two) times daily. 04/12/19  Yes Jake Bathe, MD  pantoprazole (PROTONIX) 40 MG tablet Take 1 tablet (40 mg total) by mouth daily. 08/16/19  Yes Jake Bathe, MD  potassium chloride (KLOR-CON) 10 MEQ tablet Take 1 tablet (10 mEq total) by mouth daily. 08/16/19  Yes Jake Bathe, MD  sucralfate (CARAFATE) 1 g tablet Take 1 g by mouth 3 (three) times daily before meals.   Yes [provider]  tiotropium (SPIRIVA) 18 MCG inhalation capsule Place 18 mcg into inhaler and inhale daily.   Yes [provider]  tiZANidine (ZANAFLEX) 4 MG tablet Take 4 mg by mouth 2 (two) times daily as needed for muscle spasms.  01/03/20  Yes [provider]  azithromycin (ZITHROMAX) 250 MG tablet Take 1 tablet (250 mg total) by mouth daily. Patient not taking: Reported on 01/01/2020 05/26/19   Regalado, Belkys A, MD  COSENTYX SENSOREADY, 300 MG, 150 MG/ML SOAJ INJECT 300MG  (2 PENS) SUBCUTANEOUSLY EVERY 4 WEEKS AS DIRECTED. Patient not taking: Reported on 01/26/2020 12/11/19   02/10/20, MD  diclofenac sodium (VOLTAREN) 1 % GEL Apply 4 g topically 4 (four) times daily. Patient not taking: Reported on 01/26/2020 07/26/18   07/28/18, DO  levalbuterol Melene Plan) 0.63 MG/3ML nebulizer solution Take 3 mLs (0.63  mg total) by nebulization 3 (three) times daily. Patient not taking: Reported on 01/30/2020 05/26/19   Regalado, 05/28/19 A, MD  nystatin (MYCOSTATIN) 100000 UNIT/ML suspension Take 5 mLs (500,000 Units total) by mouth 4 (four) times daily. Patient not taking: Reported on 01/12/2020 05/26/19   Regalado, 05/28/19 A, MD  predniSONE (DELTASONE) 20 MG tablet Take 2 tablets (40 mg total) by mouth daily with breakfast. Patient not taking: Reported on 01/25/2020 05/27/19   05/29/19, MD    Physical Exam: Vitals:   01/16/2020 1815 01/06/2020 1900 01/23/2020 1946 01/06/2020 2030  BP: 107/83 117/72 (!) 118/103 140/74  Pulse: 69 71 80 70  Resp: 17 19 (!) 28 (!) 23  Temp:   97.8 F (36.6 C)   TempSrc:   Oral   SpO2: 93% 93% (!) 86% 97%  Weight:      Height:       Physical Exam Constitutional:      General: She is not in acute distress.    Appearance: Normal appearance. She is obese.  HENT:     Head: Normocephalic and atraumatic.     Mouth/Throat:     Mouth: Mucous membranes are moist.     Pharynx: Oropharynx is clear.  Eyes:     Extraocular Movements: Extraocular movements intact.     Pupils: Pupils are equal, round, and reactive to light.  Cardiovascular:     Rate and Rhythm: Normal rate and regular rhythm.     Pulses: Normal pulses.     Heart sounds: Normal heart sounds.  Pulmonary:     Effort: Pulmonary effort is normal. No respiratory distress.     Breath sounds: Rales present. No wheezing.     Comments: Minimal bibasilar rales Abdominal:     General: Bowel sounds are normal. There is no distension.     Palpations: Abdomen is soft.     Tenderness: There is no abdominal tenderness.  Musculoskeletal:        General: No swelling or deformity.     Right lower leg: Edema present.     Left lower leg: Edema present.  Skin:    General: Skin is warm and dry.  Neurological:     General: No focal deficit present.     Mental Status: Mental status is at baseline.    Labs on Admission: I  have personally  reviewed following labs and imaging studies  CBC: Recent Labs  Lab 01-22-20 1722 01/22/2020 2007 01/22/20 2027  WBC 12.8*  --   --   NEUTROABS 9.3*  --   --   HGB 10.5* 12.2 12.6  HCT 37.6 36.0 37.0  MCV 93.3  --   --   PLT 210  --   --     Basic Metabolic Panel: Recent Labs  Lab 22-Jan-2020 1722 2020-01-22 2007 01/22/20 2027  NA 137 140 138  K 4.9 4.5 4.4  CL 95*  --   --   CO2 34*  --   --   GLUCOSE 124*  --   --   BUN 16  --   --   CREATININE 1.27*  --   --   CALCIUM 9.5  --   --     GFR: Estimated Creatinine Clearance: 39.7 mL/min (A) (by C-G formula based on SCr of 1.27 mg/dL (H)).  Liver Function Tests: Recent Labs  Lab 01-22-20 1722  AST 17  ALT 7  ALKPHOS 46  BILITOT 0.7  PROT 6.7  ALBUMIN 3.7    Urine analysis:    Component Value Date/Time   COLORURINE YELLOW 02/21/2019 1810   APPEARANCEUR CLEAR 02/21/2019 1810   LABSPEC 1.018 02/21/2019 1810   PHURINE 6.0 02/21/2019 1810   GLUCOSEU NEGATIVE 02/21/2019 1810   HGBUR NEGATIVE 02/21/2019 1810   BILIRUBINUR NEGATIVE 02/21/2019 1810   KETONESUR NEGATIVE 02/21/2019 1810   PROTEINUR 30 (A) 02/21/2019 1810   UROBILINOGEN 1.0 12/02/2010 1023   NITRITE NEGATIVE 02/21/2019 1810   LEUKOCYTESUR NEGATIVE 02/21/2019 1810    Radiological Exams on Admission: DG Chest Port 1 View  Result Date: 2020/01/22 CLINICAL DATA:  Shortness of breath EXAM: PORTABLE CHEST 1 VIEW COMPARISON:  Chest radiograph May 16, 2019; chest CT May 23, 2019 FINDINGS: There is atelectatic change in the mid lung and bibasilar regions. There is no edema or airspace opacity. There is cardiomegaly with pulmonary vascularity normal. Pacemaker leads are attached to the right atrium and right ventricle. Patient is status post coronary artery bypass grafting. There is aortic atherosclerosis. No adenopathy. No bone lesions. IMPRESSION: Areas of patchy atelectasis. No edema or airspace opacity. Stable cardiomegaly. Pacemaker leads  attached to right atrium and right ventricle. Status post coronary artery bypass grafting. Aortic Atherosclerosis (ICD10-I70.0). Electronically Signed   By: Bretta Bang III M.D.   On: 01-22-20 17:38    EKG: Independently reviewed.  Atrial fibrillation, nonspecific intraventricular conduction delay, QTC 494, no pacing spikes noted, similar to previous  Assessment/Plan Principal Problem:   Acute exacerbation of CHF (congestive heart failure) (HCC) Active Problems:   Hypertensive heart disease with CHF (congestive heart failure) (HCC)   COPD exacerbation (HCC)   Atrial fibrillation (HCC)   Coronary artery disease due to lipid rich plaque   Tachy-brady syndrome (HCC)   Acute on chronic respiratory failure with hypoxia (HCC)  COPD, ?Exacerbation CHF exacerbation HFpEF, Echo 02/22/2019 with EF 50-55%, G2 DD. Acute on Chronic hypoxic, hypercarbic respiratory failure   > BNP 471 in the setting of obesity, worsening edema, rales on exam, reported 13 pound weight gain, with progressive dyspnea > Requiring 4 to 5 L to maintain saturations greater than 90% worse she typically requires 2 L at home. > No wheezing on exam, but did receive Solu-Medrol prior to being seen, possibly COPD component - 40 mg IV Lasix ordered in ED, will continue 40 mg twice daily - Repeat echocardiogram as previous was about 1  year ago - Continue home inhalers, add as needed duo nebs - Strict I/O, daily weights - Trend BMP   Atrial fibrillation  > In atrial fibrillation when seen, not on anticoagulation due to history of bleed -Continue home diltiazem and metoprolol  Chronic Anemia > Hemoglobin stable at around 10.5  QT prolongation > QTC remains prolonged at 494 on admission, patient is chronically on several QT prolonging medications - Avoid additional QT prolonging medications  HTN CAD (status post MI and CABG X7) Tachy-brady syndrome status post pacemaker > Patient in atrial fibrillation in ED as  above, pacemaker in place - Continue home metoprolol, diltiazem - Lasix as above  DVT prophylaxis: Lovenox Code Status:   Full  Family Communication:  None on admission Disposition Plan:   Patient is from:  Home  Anticipated DC to:  Home  Anticipated DC date:  11/10-11/11 pending clinical response  Anticipated DC barriers: None  Consults called:  None  Admission status:  Observation, telemetry   Severity of Illness: The appropriate patient status for this patient is OBSERVATION. Observation status is judged to be reasonable and necessary in order to provide the required intensity of service to ensure the patient's safety. The patient's presenting symptoms, physical exam findings, and initial radiographic and laboratory data in the context of their medical condition is felt to place them at decreased risk for further clinical deterioration. Furthermore, it is anticipated that the patient will be medically stable for discharge from the hospital within 2 midnights of admission. The following factors support the patient status of observation.   " The patient's presenting symptoms include shortness of breath, dyspnea on exertion, lower extremity edema. " The physical exam findings include lower extremity edema, rales on exam. " The initial radiographic and laboratory data are BNP 471.  pH 7.32, PCO2 of 71.    Synetta FailAlexander B Maverick Patman MD [facility]  How to contact the [facility] Attending or Consulting provider 7A - 7P or covering provider during after hours 7P -7A, for this patient?   1. Check the care team in [facility] and look for a) attending/consulting [facility] provider listed and b) the [facility] team listed 2. Log into [email or web address] and use [place]'s universal password to access. If you do not have the password, please contact the hospital operator. 3. Locate the [facility] provider you are looking for under [facility] and page to a number that you can be directly reached. 4. If you still have  difficulty reaching the provider, please page the [facility] (Director on Call) for the Hospitalists listed on amion for assistance.  01/20/2020, 9:31 PM

## 2020-01-09 NOTE — ED Provider Notes (Signed)
[facility] EMERGENCY DEPARTMENT Provider Note   CSN: [id number] Arrival date & time: Feb 06, 2020  1712     History Chief Complaint  Patient presents with  . Shortness of Breath    [name] is a 74 y.o. female history of diastolic CHF, COPD, A. fib not on blood thinners here presenting with shortness of breath.  Patient states that she is on 2 L nasal cannula at baseline.  She states that she has worsening shortness of breath over the last 3 weeks or so.  She states that she feels short of breath with minimal exertion.  She also have about a 20 pound weight gain over the last month or so.  She also noticed that her legs are more swollen than usual.  She states that she is taking Lasix once a day and sometimes takes it twice a day if she is more swollen.  She states that home health came and visited her and called EMS because she appears volume overloaded.  The history is provided by the patient.       Past Medical History:  Diagnosis Date  . Anemia   . Arthritis   . Asthma   . Atrial fibrillation with controlled ventricular response (HCC) 11/2009   a. not on anticoagulation due to recurrent GI bleeding while on Eliquis.  . Chronic bronchitis   . Chronic diastolic CHF (congestive heart failure) (HCC)    a. Echo 12/2015: EF 55-60% w/ Grade 3 DD  . Chronic headache   . COPD (chronic obstructive pulmonary disease) (HCC)   . Depression   . Heart attack (HCC)   . Hyperlipidemia   . Hypertension     Patient Active Problem List   Diagnosis Date Noted  . Acute exacerbation of CHF (congestive heart failure) (HCC) 05/16/2019  . Pacemaker 04/06/2019  . SIRS (systemic inflammatory response syndrome) (HCC) 02/21/2019  . Acute metabolic encephalopathy 02/21/2019  . Acute respiratory failure (HCC) 02/21/2019  . AKI (acute kidney injury) (HCC) 02/21/2019  . Urinary tract infection without hematuria   . Atrial fibrillation with RVR (HCC) 07/02/2018  . Nausea  and vomiting 07/02/2018  . Nausea and/or vomiting 07/01/2018  . Acute lower UTI 07/01/2018  . Compression fracture of T12 vertebra (HCC)   . Hypoxia   . Acute on chronic diastolic CHF (congestive heart failure) /EF 55 % 08/19/2016  . CHF exacerbation (HCC) 08/19/2016  . Chronic diastolic CHF (congestive heart failure) (HCC)   . Hypoxemia   . Physical deconditioning   . Dementia with behavioral disturbance (HCC)   . H/O: GI bleed   . Protein-calorie malnutrition, severe 05/15/2016  . Altered mental status   . Acute renal failure (ARF) (HCC)   . Encounter for central line care   . Hypokalemia 05/11/2016  . Thrombocytopenia (HCC) 12/25/2015  . Acute congestive heart failure (HCC)   . SOB (shortness of breath)   . Dysphagia 12/24/2015  . Tachy-brady syndrome (HCC) 12/24/2015  . Chronic anticoagulation   . Fall   . Prolonged Q-T interval on ECG   . Hypoxic Respiratory failure, acute (HCC) 12/16/2015  . Anemia due to blood loss, acute 10/27/2015  . Elevated lactic acid level 10/27/2015  . Arterial hypotension   . PAF (paroxysmal atrial fibrillation) (HCC) 10/21/2015  . Severe anemia   . Symptomatic anemia   . Coronary artery disease due to lipid rich plaque 02/07/2014  . Obesity 02/07/2014  . Abdominal aortic ectasia (HCC) 09/06/2013  . Encounter for therapeutic drug  monitoring 07/13/2013  . Atrial fibrillation (HCC) 12/20/2012  . Atrial fibrillation with controlled ventricular response (HCC) 11/30/2009  . NICOTINE ADDICTION 04/02/2009  . PARAPSORIASIS 12/15/2008  . Hyperlipidemia 12/14/2008  . HEART ATTACK 12/14/2008  . HEADACHE, CHRONIC 12/14/2008  . Depression 12/13/2008  . 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 COPD exacerbation (HCC) 12/13/2008  . ARTHRITIS 12/13/2008    Past Surgical History:  Procedure Laterality Date  . CAD-CABG     x7, brief post-op Atrial Fib  . CESAREAN SECTION     x2  . COLONOSCOPY WITH PROPOFOL  N/A 10/24/2015   Procedure: COLONOSCOPY WITH PROPOFOL;  Surgeon: Carman ChingJames Edwards, MD;  [facility];  Service: Endoscopy;  Laterality: N/A;  . CORONARY ARTERY BYPASS GRAFT  2011  . EP IMPLANTABLE DEVICE N/A 12/26/2015   Procedure: Pacemaker Implant;  Surgeon: Marinus MawGregg W Taylor, MD;  [facility];  Service: Cardiovascular;  Laterality: N/A;  . ESOPHAGOGASTRODUODENOSCOPY N/A 10/22/2015   Procedure: ESOPHAGOGASTRODUODENOSCOPY (EGD);  Surgeon: Carman ChingJames Edwards, MD;  [facility];  Service: Endoscopy;  Laterality: N/A;  . ESOPHAGOGASTRODUODENOSCOPY N/A 12/24/2015   Procedure: ESOPHAGOGASTRODUODENOSCOPY (EGD);  Surgeon: Carman ChingJames Edwards, MD;  [facility];  Service: Endoscopy;  Laterality: N/A;  . JOINT REPLACEMENT Bilateral   . TONSILLECTOMY    . TOTAL KNEE ARTHROPLASTY       OB History   No obstetric history on file.     Family History  Problem Relation Age of Onset  . Diabetes Mother   . Hypertension Mother   . Heart disease Mother        before age 260  . Heart disease Father        before age 76  . Hypertension Father   . Heart attack Father   . Lung cancer Maternal Grandfather   . Lung cancer Paternal Grandfather   . Hypertension Daughter   . Stroke Paternal Grandmother     Social History   Tobacco Use  . Smoking status: Current Every Day Smoker    Packs/day: 0.20    Types: Cigarettes  . Smokeless tobacco: Never Used  . Tobacco comment: "Occasionally I will smoke a cigarette"   Vaping Use  . Vaping Use: Never used  Substance Use Topics  . Alcohol use: No    Alcohol/week: 0.0 standard drinks  . Drug use: No    Home Medications Prior to Admission medications   Medication Sig Start Date End Date Taking? Authorizing Provider  albuterol (PROVENTIL HFA;VENTOLIN HFA) 108 (90 Base) MCG/ACT inhaler Inhale 2 puffs into the lungs every 6 (six) hours as needed for wheezing or shortness of breath.   Yes [provider]  ALPRAZolam  (XANAX) 0.25 MG tablet Take 1 tablet (0.25 mg total) by mouth at bedtime as needed for anxiety. 05/22/19  Yes Dow AdolphHall, Carole N, DO  atorvastatin (LIPITOR) 20 MG tablet Take 1 tablet (20 mg total) by mouth daily. Patient taking differently: Take 20 mg by mouth every evening.  11/21/19  Yes Jake BatheSkains, Mark C, MD  budesonide-formoterol (SYMBICORT) 160-4.5 MCG/ACT inhaler Inhale 2 puffs into the lungs 2 (two) times daily. 01/15/14  Yes Storm FriskWright, Patrick E, MD  citalopram (CELEXA) 20 MG tablet Take 40 mg by mouth in the morning.  07/08/16  Yes [provider]  clobetasol cream (TEMOVATE) 0.05 % Apply 1 application topically 2 (two) times daily. Affected skin 05/21/16  Yes Vassie LollMadera, Carlos, MD  diltiazem [facility](TIAZAC) 120 MG 24 hr capsule TAKE 1 CAPSULE  BY MOUTH DAILY Patient taking differently: Take 120 mg by mouth in the morning.  11/21/19  Yes Jake Bathe, MD  docusate sodium (COLACE) 100 MG capsule Take 100 mg by mouth 2 (two) times daily as needed for moderate constipation.    Yes [provider]  fluticasone (FLONASE) 50 MCG/ACT nasal spray Place 2 sprays into both nostrils daily. 12/21/15  Yes Albertine Grates, MD  furosemide (LASIX) 20 MG tablet TAKE 1 TABLET BY MOUTH DAILY Patient taking differently: Take 20 mg by mouth daily.  11/21/19  Yes Jake Bathe, MD  Melatonin 10 MG TABS Take 10 mg by mouth at bedtime.   Yes [provider]  metoprolol tartrate (LOPRESSOR) 50 MG tablet Take 1.5 tablets (75 mg total) by mouth 2 (two) times daily. 04/12/19  Yes Jake Bathe, MD  pantoprazole (PROTONIX) 40 MG tablet Take 1 tablet (40 mg total) by mouth daily. 08/16/19  Yes Jake Bathe, MD  potassium chloride (KLOR-CON) 10 MEQ tablet Take 1 tablet (10 mEq total) by mouth daily. 08/16/19  Yes Jake Bathe, MD  sucralfate (CARAFATE) 1 g tablet Take 1 g by mouth 3 (three) times daily before meals.   Yes [provider]  tiotropium (SPIRIVA) 18 MCG inhalation capsule Place 18 mcg into inhaler and  inhale daily.   Yes [provider]  tiZANidine (ZANAFLEX) 4 MG tablet Take 4 mg by mouth 2 (two) times daily as needed for muscle spasms.  01/03/20  Yes [provider]  azithromycin (ZITHROMAX) 250 MG tablet Take 1 tablet (250 mg total) by mouth daily. Patient not taking: Reported on 01/14/2020 05/26/19   Regalado, Belkys A, MD  COSENTYX SENSOREADY, 300 MG, 150 MG/ML SOAJ INJECT 300MG  (2 PENS) SUBCUTANEOUSLY EVERY 4 WEEKS AS DIRECTED. Patient not taking: Reported on 01/17/2020 12/11/19   02/10/20, MD  diclofenac sodium (VOLTAREN) 1 % GEL Apply 4 g topically 4 (four) times daily. Patient not taking: Reported on 01/28/2020 07/26/18   07/28/18, DO  levalbuterol Melene Plan) 0.63 MG/3ML nebulizer solution Take 3 mLs (0.63 mg total) by nebulization 3 (three) times daily. Patient not taking: Reported on 01/17/2020 05/26/19   Regalado, 05/28/19 A, MD  nystatin (MYCOSTATIN) 100000 UNIT/ML suspension Take 5 mLs (500,000 Units total) by mouth 4 (four) times daily. Patient not taking: Reported on 01/07/2020 05/26/19   Regalado, 05/28/19 A, MD  predniSONE (DELTASONE) 20 MG tablet Take 2 tablets (40 mg total) by mouth daily with breakfast. Patient not taking: Reported on 01/14/2020 05/27/19   05/29/19 A, MD    Allergies    Novocain [procaine] and Penicillins  Review of Systems   Review of Systems  Respiratory: Positive for shortness of breath.   All other systems reviewed and are negative.   Physical Exam Updated Vital Signs BP (!) 118/103 (BP Location: Right Arm)   Pulse 80   Temp 97.8 F (36.6 C) (Oral)   Resp (!) 28   Ht 5\' 2"  (1.575 m)   Wt 91.6 kg   LMP  (LMP Unknown)   SpO2 (!) 86%   BMI 36.95 kg/m   Physical Exam Vitals and nursing note reviewed.  Constitutional:      Comments: Chronically ill  HENT:     Head: Normocephalic.     Mouth/Throat:     Mouth: Mucous membranes are moist.  Eyes:     Extraocular Movements: Extraocular movements intact.     Pupils:  Pupils are equal, round, and reactive to light.  Cardiovascular:     Rate and Rhythm: Normal rate and regular rhythm.  Pulmonary:     Comments: Tachypneic and crackles bilateral bases Abdominal:     General: Bowel sounds are normal.     Palpations: Abdomen is soft.  Musculoskeletal:     Cervical back: Normal range of motion and neck supple.     Comments: 2+ edema bilateral legs   Skin:    General: Skin is warm.     Capillary Refill: Capillary refill takes less than 2 seconds.  Neurological:     General: No focal deficit present.     Mental Status: She is oriented to person, place, and time.  Psychiatric:        Mood and Affect: Mood normal.        Behavior: Behavior normal.     ED Results / Procedures / Treatments   Labs (all labs ordered are listed, but only abnormal results are displayed) Labs Reviewed  CBC WITH DIFFERENTIAL/PLATELET - Abnormal; Notable for the following components:      Result Value   WBC 12.8 (*)    Hemoglobin 10.5 (*)    MCHC 27.9 (*)    Neutro Abs 9.3 (*)    Monocytes Absolute 1.4 (*)    Abs Immature Granulocytes 0.08 (*)    All other components within normal limits  COMPREHENSIVE METABOLIC PANEL - Abnormal; Notable for the following components:   Chloride 95 (*)    CO2 34 (*)    Glucose, Bld 124 (*)    Creatinine, Ser 1.27 (*)    GFR, Estimated 44 (*)    All other components within normal limits  BRAIN NATRIURETIC PEPTIDE - Abnormal; Notable for the following components:   B Natriuretic Peptide 471.2 (*)    All other components within normal limits  I-STAT ARTERIAL BLOOD GAS, ED - Abnormal; Notable for the following components:   pH, Arterial 7.324 (*)    pCO2 arterial 71.5 (*)    pO2, Arterial 76 (*)    Bicarbonate 37.4 (*)    TCO2 40 (*)    Acid-Base Excess 9.0 (*)    All other components within normal limits  RESPIRATORY PANEL BY RT PCR (FLU A&B, COVID)  BLOOD GAS, ARTERIAL  URINALYSIS, ROUTINE W REFLEX MICROSCOPIC  I-STAT VENOUS  BLOOD GAS, ED  TROPONIN I (HIGH SENSITIVITY)  TROPONIN I (HIGH SENSITIVITY)    EKG EKG Interpretation  Date/Time:  Tuesday 2020/01/23 17:25:25 EST Ventricular Rate:  69 PR Interval:    QRS Duration: 164 QT Interval:  461 QTC Calculation: 494 R Axis:   -109 Text Interpretation: Atrial fibrillation Nonspecific IVCD with LAD Anterolateral infarct, recent No significant change since last tracing Confirmed by Richardean Canal [id number]) on January 23, 2020 5:30:43 PM   Radiology DG Chest Port 1 View  Result Date: January 23, 2020 CLINICAL DATA:  Shortness of breath EXAM: PORTABLE CHEST 1 VIEW COMPARISON:  Chest radiograph May 16, 2019; chest CT May 23, 2019 FINDINGS: There is atelectatic change in the mid lung and bibasilar regions. There is no edema or airspace opacity. There is cardiomegaly with pulmonary vascularity normal. Pacemaker leads are attached to the right atrium and right ventricle. Patient is status post coronary artery bypass grafting. There is aortic atherosclerosis. No adenopathy. No bone lesions. IMPRESSION: Areas of patchy atelectasis. No edema or airspace opacity. Stable cardiomegaly. Pacemaker leads attached to right atrium and right ventricle. Status post coronary artery bypass grafting. Aortic Atherosclerosis (ICD10-I70.0). Electronically Signed   By: Bretta Bang  III M.D.   On: 2020-01-25 17:38    Procedures Procedures (including critical care time)  CRITICAL CARE Performed by: Richardean Canal   Total critical care time: 30 minutes  Critical care time was exclusive of separately billable procedures and treating other patients.  Critical care was necessary to treat or prevent imminent or life-threatening deterioration.  Critical care was time spent personally by me on the following activities: development of treatment plan with patient and/or surrogate as well as nursing, discussions with consultants, evaluation of patient's response to treatment, examination of patient,  obtaining history from patient or surrogate, ordering and performing treatments and interventions, ordering and review of laboratory studies, ordering and review of radiographic studies, pulse oximetry and re-evaluation of patient's condition.  Medications Ordered in ED Medications  furosemide (LASIX) injection 40 mg (has no administration in time range)  methylPREDNISolone sodium succinate (SOLU-MEDROL) 125 mg/2 mL injection 125 mg (has no administration in time range)  albuterol (VENTOLIN HFA) 108 (90 Base) MCG/ACT inhaler 2 puff (has no administration in time range)    ED Course  I have reviewed the triage vital signs and the nursing notes.  Pertinent labs & imaging results that were available during my care of the patient were reviewed by me and considered in my medical decision making (see chart for details).    MDM Rules/Calculators/A&P                         Gabrielle [name] is a 74 y.o. female here presenting with shortness of breath.  Likely CHF exacerbation.  Patient appears volume overloaded.  Plan to get CBC, CMP, troponin, BNP, chest x-ray.  Will give Lasix if her kidney function is baseline.   8:17 PM Patient's oxygen dropped to 86% on her baseline 2 L.  ABG was obtained on 5 L [place] and her CO2 is 72 but she is partially compensated with pH of 7.3.  I wonder if she has COPD exacerbation as well.  BNP is elevated at 500.  Given Lasix and will give Solu-Medrol and albuterol.  Will admit for hypoxia from CHF and COPD.  Final Clinical Impression(s) / ED Diagnoses Final diagnoses:  None    Rx / DC Orders ED Discharge Orders    None       Charlynne Pander, MD 01/25/20 2018

## 2020-01-10 ENCOUNTER — Observation Stay (HOSPITAL_COMMUNITY): Payer: Medicare Other

## 2020-01-10 ENCOUNTER — Telehealth: Payer: Self-pay | Admitting: Cardiology

## 2020-01-10 ENCOUNTER — Other Ambulatory Visit: Payer: Self-pay

## 2020-01-10 DIAGNOSIS — J9622 Acute and chronic respiratory failure with hypercapnia: Secondary | ICD-10-CM | POA: Diagnosis present

## 2020-01-10 DIAGNOSIS — I495 Sick sinus syndrome: Secondary | ICD-10-CM | POA: Diagnosis present

## 2020-01-10 DIAGNOSIS — I2583 Coronary atherosclerosis due to lipid rich plaque: Secondary | ICD-10-CM | POA: Diagnosis present

## 2020-01-10 DIAGNOSIS — R0602 Shortness of breath: Secondary | ICD-10-CM

## 2020-01-10 DIAGNOSIS — J441 Chronic obstructive pulmonary disease with (acute) exacerbation: Secondary | ICD-10-CM | POA: Diagnosis present

## 2020-01-10 DIAGNOSIS — Z20822 Contact with and (suspected) exposure to covid-19: Secondary | ICD-10-CM | POA: Diagnosis present

## 2020-01-10 DIAGNOSIS — R0689 Other abnormalities of breathing: Secondary | ICD-10-CM | POA: Diagnosis not present

## 2020-01-10 DIAGNOSIS — I469 Cardiac arrest, cause unspecified: Secondary | ICD-10-CM | POA: Diagnosis not present

## 2020-01-10 DIAGNOSIS — N1831 Chronic kidney disease, stage 3a: Secondary | ICD-10-CM | POA: Diagnosis present

## 2020-01-10 DIAGNOSIS — F17210 Nicotine dependence, cigarettes, uncomplicated: Secondary | ICD-10-CM | POA: Diagnosis present

## 2020-01-10 DIAGNOSIS — I4819 Other persistent atrial fibrillation: Secondary | ICD-10-CM | POA: Diagnosis not present

## 2020-01-10 DIAGNOSIS — F419 Anxiety disorder, unspecified: Secondary | ICD-10-CM | POA: Diagnosis present

## 2020-01-10 DIAGNOSIS — J9601 Acute respiratory failure with hypoxia: Secondary | ICD-10-CM | POA: Diagnosis not present

## 2020-01-10 DIAGNOSIS — N179 Acute kidney failure, unspecified: Secondary | ICD-10-CM | POA: Diagnosis not present

## 2020-01-10 DIAGNOSIS — R0902 Hypoxemia: Secondary | ICD-10-CM | POA: Diagnosis not present

## 2020-01-10 DIAGNOSIS — Z79899 Other long term (current) drug therapy: Secondary | ICD-10-CM | POA: Diagnosis not present

## 2020-01-10 DIAGNOSIS — I130 Hypertensive heart and chronic kidney disease with heart failure and stage 1 through stage 4 chronic kidney disease, or unspecified chronic kidney disease: Secondary | ICD-10-CM | POA: Diagnosis present

## 2020-01-10 DIAGNOSIS — I4821 Permanent atrial fibrillation: Secondary | ICD-10-CM | POA: Diagnosis present

## 2020-01-10 DIAGNOSIS — I2510 Atherosclerotic heart disease of native coronary artery without angina pectoris: Secondary | ICD-10-CM | POA: Diagnosis present

## 2020-01-10 DIAGNOSIS — I361 Nonrheumatic tricuspid (valve) insufficiency: Secondary | ICD-10-CM | POA: Diagnosis not present

## 2020-01-10 DIAGNOSIS — J9621 Acute and chronic respiratory failure with hypoxia: Secondary | ICD-10-CM | POA: Diagnosis present

## 2020-01-10 DIAGNOSIS — M1990 Unspecified osteoarthritis, unspecified site: Secondary | ICD-10-CM | POA: Diagnosis present

## 2020-01-10 DIAGNOSIS — I480 Paroxysmal atrial fibrillation: Secondary | ICD-10-CM | POA: Diagnosis not present

## 2020-01-10 DIAGNOSIS — E785 Hyperlipidemia, unspecified: Secondary | ICD-10-CM | POA: Diagnosis present

## 2020-01-10 DIAGNOSIS — Z66 Do not resuscitate: Secondary | ICD-10-CM | POA: Diagnosis not present

## 2020-01-10 DIAGNOSIS — I340 Nonrheumatic mitral (valve) insufficiency: Secondary | ICD-10-CM | POA: Diagnosis not present

## 2020-01-10 DIAGNOSIS — Z515 Encounter for palliative care: Secondary | ICD-10-CM | POA: Diagnosis not present

## 2020-01-10 DIAGNOSIS — F0391 Unspecified dementia with behavioral disturbance: Secondary | ICD-10-CM | POA: Diagnosis present

## 2020-01-10 DIAGNOSIS — E872 Acidosis: Secondary | ICD-10-CM | POA: Diagnosis not present

## 2020-01-10 DIAGNOSIS — D631 Anemia in chronic kidney disease: Secondary | ICD-10-CM | POA: Diagnosis present

## 2020-01-10 DIAGNOSIS — J9602 Acute respiratory failure with hypercapnia: Secondary | ICD-10-CM | POA: Diagnosis not present

## 2020-01-10 DIAGNOSIS — I2721 Secondary pulmonary arterial hypertension: Secondary | ICD-10-CM | POA: Diagnosis present

## 2020-01-10 DIAGNOSIS — I509 Heart failure, unspecified: Secondary | ICD-10-CM | POA: Diagnosis not present

## 2020-01-10 DIAGNOSIS — J9600 Acute respiratory failure, unspecified whether with hypoxia or hypercapnia: Secondary | ICD-10-CM | POA: Diagnosis present

## 2020-01-10 DIAGNOSIS — F32A Depression, unspecified: Secondary | ICD-10-CM | POA: Diagnosis present

## 2020-01-10 DIAGNOSIS — I5033 Acute on chronic diastolic (congestive) heart failure: Secondary | ICD-10-CM | POA: Diagnosis not present

## 2020-01-10 LAB — BASIC METABOLIC PANEL
Anion gap: 10 (ref 5–15)
BUN: 14 mg/dL (ref 8–23)
CO2: 34 mmol/L — ABNORMAL HIGH (ref 22–32)
Calcium: 9.6 mg/dL (ref 8.9–10.3)
Chloride: 95 mmol/L — ABNORMAL LOW (ref 98–111)
Creatinine, Ser: 1.15 mg/dL — ABNORMAL HIGH (ref 0.44–1.00)
GFR, Estimated: 49 mL/min — ABNORMAL LOW (ref >60)
Glucose, Bld: 171 mg/dL — ABNORMAL HIGH (ref 70–99)
Potassium: 4.8 mmol/L (ref 3.5–5.1)
Sodium: 139 mmol/L (ref 135–145)

## 2020-01-10 LAB — GLUCOSE, CAPILLARY
Glucose-Capillary: 173 mg/dL — ABNORMAL HIGH (ref 70–99)
Glucose-Capillary: 169 mg/dL — ABNORMAL HIGH (ref 70–99)

## 2020-01-10 LAB — CBC
HCT: 36.4 % (ref 36.0–46.0)
Hemoglobin: 10.3 g/dL — ABNORMAL LOW (ref 12.0–15.0)
MCH: 26.2 pg (ref 26.0–34.0)
MCHC: 28.3 g/dL — ABNORMAL LOW (ref 30.0–36.0)
MCV: 92.6 fL (ref 80.0–100.0)
Platelets: 166 10*3/uL (ref 150–400)
RBC: 3.93 MIL/uL (ref 3.87–5.11)
RDW: 15.5 % (ref 11.5–15.5)
WBC: 8.4 10*3/uL (ref 4.0–10.5)
nRBC: 0 % (ref 0.0–0.2)

## 2020-01-10 LAB — MRSA PCR SCREENING: MRSA by PCR: NEGATIVE

## 2020-01-10 LAB — ECHOCARDIOGRAM COMPLETE
Area-P 1/2: 2.42 cm2
Height: 62.5 in
S' Lateral: 3.5 cm
Weight: 3227.53 oz

## 2020-01-10 LAB — HEMOGLOBIN A1C
Hgb A1c MFr Bld: 6 % — ABNORMAL HIGH (ref 4.8–5.6)
Mean Plasma Glucose: 125.5 mg/dL

## 2020-01-10 MED ORDER — FUROSEMIDE 40 MG PO TABS
40.0000 mg | ORAL_TABLET | Freq: Every day | ORAL | Status: DC
  Administered 2020-01-10: 40 mg via ORAL
  Filled 2020-01-10: qty 1

## 2020-01-10 MED ORDER — ORAL CARE MOUTH RINSE
15.0000 mL | Freq: Two times a day (BID) | OROMUCOSAL | Status: DC
  Administered 2020-01-11 – 2020-01-18 (×14): 15 mL via OROMUCOSAL

## 2020-01-10 MED ORDER — IPRATROPIUM-ALBUTEROL 0.5-2.5 (3) MG/3ML IN SOLN
3.0000 mL | Freq: Four times a day (QID) | RESPIRATORY_TRACT | Status: DC
  Administered 2020-01-10 – 2020-01-11 (×5): 3 mL via RESPIRATORY_TRACT
  Filled 2020-01-10 (×6): qty 3

## 2020-01-10 MED ORDER — METHYLPREDNISOLONE SODIUM SUCC 125 MG IJ SOLR
60.0000 mg | Freq: Four times a day (QID) | INTRAMUSCULAR | Status: DC
  Administered 2020-01-10 – 2020-01-11 (×5): 60 mg via INTRAVENOUS
  Filled 2020-01-10 (×5): qty 2

## 2020-01-10 MED ORDER — INSULIN ASPART 100 UNIT/ML ~~LOC~~ SOLN
0.0000 [IU] | Freq: Three times a day (TID) | SUBCUTANEOUS | Status: DC
  Administered 2020-01-10: 2 [IU] via SUBCUTANEOUS
  Administered 2020-01-11 – 2020-01-12 (×3): 3 [IU] via SUBCUTANEOUS
  Administered 2020-01-12: 1 [IU] via SUBCUTANEOUS
  Administered 2020-01-12 – 2020-01-13 (×4): 2 [IU] via SUBCUTANEOUS
  Administered 2020-01-14: 3 [IU] via SUBCUTANEOUS
  Administered 2020-01-14: 5 [IU] via SUBCUTANEOUS
  Administered 2020-01-14: 3 [IU] via SUBCUTANEOUS
  Administered 2020-01-15: 2 [IU] via SUBCUTANEOUS
  Administered 2020-01-15: 5 [IU] via SUBCUTANEOUS
  Administered 2020-01-15: 2 [IU] via SUBCUTANEOUS
  Administered 2020-01-16 (×2): 5 [IU] via SUBCUTANEOUS
  Administered 2020-01-16: 3 [IU] via SUBCUTANEOUS
  Administered 2020-01-17: 5 [IU] via SUBCUTANEOUS
  Administered 2020-01-17: 3 [IU] via SUBCUTANEOUS
  Administered 2020-01-17 – 2020-01-18 (×2): 2 [IU] via SUBCUTANEOUS

## 2020-01-10 NOTE — Telephone Encounter (Signed)
Will notify Dr Anne Fu - COPD vs CHF or both.

## 2020-01-10 NOTE — Telephone Encounter (Signed)
Patient called to inform she is currently in the hospital.

## 2020-01-10 NOTE — Telephone Encounter (Signed)
Thank you for the update.  In fact, I read her echocardiogram today. Donato Schultz, MD

## 2020-01-10 NOTE — Progress Notes (Signed)
°  Echocardiogram 2D Echocardiogram has been performed.  Celene Skeen 01/10/2020, 12:03 PM

## 2020-01-10 NOTE — Evaluation (Signed)
Occupational Therapy Evaluation Patient Details Name: Gabrielle [name] MRN: [id number] DOB: April 27, 1945 Today's Date: 01/10/2020    History of Present Illness 74 year old female with history of COPD, HFpEF, atrial fibrillation, anemia, QT prolongation, CAD s/p MI and CABG x7, hypertension, tachybradycardia syndrome status post pacemaker presented with progressive dyspnea patient usually is on 2 L of oxygen at home for COPD.  And was requiring 5 L of oxygen.  ABG showed PCO2 of 71, pH 7.32.   Clinical Impression   Patient admitted for the above diagnosis.  Presents with declines to activity tolerance, poor stand balance, and generalized weakness.  Patient was willing to move a little, but still declining mobility in the room/hall.  At home, her sister assists with lower body ADL and peri care, she does not drive, her twin assists with community mobility, and she uses home oxygen.  Her plan is to return home, she is open to [facility] services if needed.  OT will continue to follow in the acute setting.      Follow Up Recommendations  Home health OT    Equipment Recommendations  None recommended by OT    Recommendations for Other Services       Precautions / Restrictions Precautions Precautions: Fall Precaution Comments: monitor O2 Restrictions Weight Bearing Restrictions: No      Mobility Bed Mobility Overal bed mobility: Needs Assistance Bed Mobility: Supine to Sit;Sit to Supine     Supine to sit: Supervision;HOB elevated Sit to supine: Min guard;HOB elevated        Transfers Overall transfer level: Needs assistance Equipment used: None Transfers: Stand Pivot Transfers Sit to Stand: Min guard         General transfer comment: patient declined mobility.    Balance Overall balance assessment: Needs assistance Sitting-balance support: No upper extremity supported;Feet supported Sitting balance-Leahy Scale: Good     Standing balance support: Bilateral upper extremity  supported Standing balance-Leahy Scale: Fair                             ADL either performed or assessed with clinical judgement   ADL Overall ADL's : Needs assistance/impaired Eating/Feeding: Independent   Grooming: Wash/dry hands;Wash/dry face;Set up   Upper Body Bathing: Set up;Sitting   Lower Body Bathing: Moderate assistance;Sit to/from stand       Lower Body Dressing: Moderate assistance;Sitting/lateral leans                       Vision Baseline Vision/History: Wears glasses Wears Glasses: Reading only Patient Visual Report: No change from baseline       Perception     Praxis      Pertinent Vitals/Pain Pain Assessment: No/denies pain     Hand Dominance Right   Extremity/Trunk Assessment Upper Extremity Assessment Upper Extremity Assessment: Overall WFL for tasks assessed   Lower Extremity Assessment Lower Extremity Assessment: Defer to PT evaluation   Cervical / Trunk Assessment Cervical / Trunk Assessment: Normal   Communication Communication Communication: No difficulties   Cognition Arousal/Alertness: Awake/alert Behavior During Therapy: WFL for tasks assessed/performed Overall Cognitive Status: Within Functional Limits for tasks assessed  Home Living Family/patient expects to be discharged to:: Private residence Living Arrangements: Other relatives Available Help at Discharge: Family;Available 24 hours/day Type of Home: House Home Access: Stairs to enter [employer] of Steps: 5 Entrance Stairs-Rails: Can reach both Home Layout: Multi-level;Other (Comment) Alternate Level Stairs-Number of Steps: 6 Alternate Level Stairs-Rails: Right Bathroom Shower/Tub: Producer, television/film/video: Standard Bathroom Accessibility: Yes How Accessible: Accessible via walker Home Equipment: Cane - single point;Shower seat;Bedside commode;Grab  bars - tub/shower   Additional Comments: bathroom is close to bedroom      Prior Functioning/Environment Level of Independence: Independent with assistive device(s)        Comments: Patient's younger sister is staying with her for now.  Should be there for a number of weeks.  Her twin assists with community mobility and groceries and MD appointments.        OT Problem List: Decreased activity tolerance;Impaired balance (sitting and/or standing)      OT Treatment/Interventions: Self-care/ADL training;Therapeutic exercise;Energy conservation;DME and/or AE instruction;Balance training;Therapeutic activities    OT Goals(Current goals can be found in the care plan section) Acute Rehab OT Goals Patient Stated Goal: Wants to move a little better OT Goal Formulation: With patient Time For Goal Achievement: 01/24/20 Potential to Achieve Goals: Good ADL Goals Pt Will Perform Grooming: with modified independence;sitting Pt Will Perform Lower Body Bathing: with min assist;sit to/from stand Pt Will Perform Lower Body Dressing: with min assist;sit to/from stand Pt Will Transfer to Toilet: with supervision;ambulating;regular height toilet  OT Frequency: Min 2X/week   Barriers to D/C:    none noted       Co-evaluation              AM-PAC OT "6 Clicks" Daily Activity     Outcome Measure Help from another person eating meals?: None Help from another person taking care of personal grooming?: None Help from another person toileting, which includes using toliet, bedpan, or urinal?: A Little Help from another person bathing (including washing, rinsing, drying)?: A Lot Help from another person to put on and taking off regular upper body clothing?: A Little Help from another person to put on and taking off regular lower body clothing?: A Lot 6 Click Score: 18   End of Session Equipment Utilized During Treatment: Rolling walker;Gait belt;Oxygen Nurse Communication: Other (comment) (HH  OT)  Activity Tolerance: Patient limited by lethargy Patient left: in bed;with call bell/phone within reach;with nursing/sitter in room  OT Visit Diagnosis: Unsteadiness on feet (R26.81)                Time: 2992-4268 OT Time Calculation (min): 28 min Charges:  OT General Charges $OT Visit: 1 Visit OT Evaluation $OT Eval Moderate Complexity: 1 Mod  01/10/2020  Rich, OTR/L  Acute Rehabilitation Services  Office:  [phone]   Suzanna Obey 01/10/2020, 4:42 PM

## 2020-01-10 NOTE — Evaluation (Signed)
Physical Therapy Evaluation Patient Details Name: Gabrielle [name] MRN: [id number] DOB: 1945/10/16 Today's Date: 01/10/2020   History of Present Illness  74 year old female with history of COPD, HFpEF, atrial fibrillation, anemia, QT prolongation, CAD s/p MI and CABG x7, hypertension, tachybradycardia syndrome status post pacemaker presented with progressive dyspnea patient usually is on 2 L of oxygen at home for COPD.  And was requiring 5 L of oxygen.  ABG showed PCO2 of 71, pH 7.32.  Clinical Impression  Pt and sister present during evaluation; pt and sister do not agree on prior level of function, pt states she can get up and move around but chooses not to and sister claims she doesn't move at all; pt requiring increased education regarding purpose of therapy and benefit of continued therapy, pt requiring increased education to motivate pt to participate in session; Pt demonstrating deficits in overall strength and endurance which is limiting her functional mobility; pt is requiring min A for functional mobiltiy tasks but is requiring increased rest due to fatigue; pt will benefit form rollator to provide seat when fatigue during ambulation in the house; pt will benefit from HHPT to progress endurance and strength to increase I with functional mobility; pt will continue to benefit from skilled acute PT to address deficits to maximize independence prior to discharge.     Follow Up Recommendations Home health PT;Supervision/Assistance - 24 hour    Equipment Recommendations  Other (comment) (rollator)    Recommendations for Other Services       Precautions / Restrictions Precautions Precautions: Fall Precaution Comments: monitor O2      Mobility  Bed Mobility Overal bed mobility: Needs Assistance Bed Mobility: Supine to Sit;Sit to Supine     Supine to sit: Min guard;HOB elevated Sit to supine: Min guard;HOB elevated        Transfers Overall transfer level: Needs  assistance Equipment used: None Transfers: Sit to/from Stand Sit to Stand: Min guard         General transfer comment: steadying assist  Ambulation/Gait Ambulation/Gait assistance: Min assist Gait Distance (Feet): 2 Feet Assistive device: 1 person hand held assist       General Gait Details: side stepping to EOB; pt very fatigued following side stepping  Stairs            Wheelchair Mobility    Modified Rankin (Stroke Patients Only)       Balance Overall balance assessment: Needs assistance Sitting-balance support: No upper extremity supported;Feet supported Sitting balance-Leahy Scale: Fair     Standing balance support: Single extremity supported Standing balance-Leahy Scale: Fair                               Pertinent Vitals/Pain Pain Assessment: No/denies pain    Home Living Family/patient expects to be discharged to:: Private residence Living Arrangements: Alone (sister is available 24/hr A) Available Help at Discharge: Family;Available 24 hours/day Type of Home: House Home Access: Stairs to enter Entrance Stairs-Rails: Can reach both Entrance Stairs-Number of Steps: 5 Home Layout: Multi-level;Other (Comment) (split level foyer but will need to ascend 6 steps to get to level where she lives) Home Equipment: Gilmer Mor - single point;Shower seat;Bedside commode;Grab bars - tub/shower Additional Comments: bathroom is close to bedroom    Prior Function Level of Independence: Independent with assistive device(s)         Comments: typically uses cane, mostly been staying at home. sister has been doing grocery shopping,  patient does not drive     Hand Dominance   Dominant Hand: Right    Extremity/Trunk Assessment   Upper Extremity Assessment Upper Extremity Assessment: Defer to OT evaluation    Lower Extremity Assessment Lower Extremity Assessment: Generalized weakness       Communication   Communication: No difficulties   Cognition Arousal/Alertness: Awake/alert Behavior During Therapy: WFL for tasks assessed/performed Overall Cognitive Status: Within Functional Limits for tasks assessed                                        General Comments      Exercises     Assessment/Plan    PT Assessment Patient needs continued PT services  PT Problem List Decreased strength;Decreased mobility;Decreased coordination;Decreased activity tolerance;Decreased balance;Decreased knowledge of use of DME       PT Treatment Interventions DME instruction;Therapeutic exercise;Gait training;Balance training;Stair training;Functional mobility training;Therapeutic activities;Patient/family education    PT Goals (Current goals can be found in the Care Plan section)  Acute Rehab PT Goals Patient Stated Goal: I want to go home PT Goal Formulation: With patient Time For Goal Achievement: 01/24/20 Potential to Achieve Goals: Good    Frequency Min 3X/week   Barriers to discharge        Co-evaluation               AM-PAC PT "6 Clicks" Mobility  Outcome Measure Help needed turning from your back to your side while in a flat bed without using bedrails?: None Help needed moving from lying on your back to sitting on the side of a flat bed without using bedrails?: A Little Help needed moving to and from a bed to a chair (including a wheelchair)?: A Little Help needed standing up from a chair using your arms (e.g., wheelchair or bedside chair)?: A Little Help needed to walk in hospital room?: A Little Help needed climbing 3-5 steps with a railing? : A Lot 6 Click Score: 18    End of Session Equipment Utilized During Treatment: Gait belt;Oxygen Activity Tolerance: Patient limited by fatigue Patient left: in bed;with call bell/phone within reach;with family/visitor present Nurse Communication: Mobility status PT Visit Diagnosis: Unsteadiness on feet (R26.81);Difficulty in walking, not elsewhere  classified (R26.2);Muscle weakness (generalized) (M62.81)    Time: 8756-4332 PT Time Calculation (min) (ACUTE ONLY): 27 min   Charges:   PT Evaluation $PT Eval Low Complexity: 1 Low PT Treatments $Therapeutic Activity: 8-22 mins        Ginette Otto, DPT Acute Rehabilitation Services [phone]  Lucretia Field 01/10/2020, 1:21 PM

## 2020-01-10 NOTE — Progress Notes (Addendum)
  ReDS Clip Diuretic Study Pt study # [id number]  Your patient has been enrolled in the ReDS Clip Diuretic Study  Has received IV lasix 40 mg x 2 since admission, uop documented at 700 mL out. Weight 201 lbs. Reported dry weight is 189 lbs. SCr improved 1.27>1.15.  Changes to prescribed diuretics recommended:  Agree with transitioning to PO lasix 40 mg daily Provider contacted: Dr. Sharl Ma Recommendation was accepted by provider.   REDS Clip  READING= 23%  CHEST RULER = 38 Clip Station = B   Orthodema score = 3>0 Signs/Symptoms Score   Mild edema, no orthopnea 0 No congestion  Moderate edema, no orthopnea 1 Low-grade orthodema/congestion  Severe edema OR orthopnea 2   Moderate edema and orthopnea 3 High-grade orthodema/congestion  Severe edema AND orthopnea 4    Sharen Hones, PharmD, BCPS Phone [phone]  Please check [email or web address] for unit-specific pharmacist phone numbers

## 2020-01-10 NOTE — Plan of Care (Signed)
  Problem: Education: Goal: Knowledge of General Education information will improve Description: Including pain rating scale, medication(s)/side effects and non-pharmacologic comfort measures 01/10/2020 1318 by Sharol Given, RN Outcome: Progressing 01/10/2020 1317 by Sharol Given, RN Outcome: Progressing

## 2020-01-10 NOTE — Progress Notes (Addendum)
[facility]  PROGRESS NOTE  Gabrielle [name] LYY:[id number] DOB: Oct 01, 1945 DOA: 01-14-20 PCP: Henrine Screws, MD   Brief HPI:   74 year old female with history of COPD, HFpEF, atrial fibrillation, anemia, QT prolongation, CAD s/p MI and CABG x7, hypertension, tachybradycardia syndrome status post pacemaker presented with progressive dyspnea patient usually is on 2 L of oxygen at home for COPD.  And was requiring 5 L of oxygen.  ABG showed PCO2 of 71, pH 7.32. BNP was elevated at 471, patient started on IV Lasix.   Subjective   Patient seen and examined, still requiring 4 L of oxygen via nasal cannula.   Assessment/Plan:     1. Acute hypercapnic respiratory failure-patient has underlying COPD and is on 2 L of oxygen at home chronically.  She presented with COPD exacerbation, received Solu-Medrol in the ED.  Will start DuoNeb every 6 hours, Solu-Medrol 60 mg IV every 6 hours.  We will try to wean off oxygen back to her baseline 2 L/min via nasal cannula. 2. Acute on chronic diastolic CHF-echocardiogram from 2020 showed grade 2 diastolic dysfunction, EF 50 to 55%.  Patient received Lasix 40 mg IV every 12 hours.  She only had 700 cc urine output.  We will switch IV Lasix to p.o. Lasix 40 mg p.o. daily. 3. Chronic atrial fibrillation-patient is not on anticoagulant due to history of GI bleed, continue diltiazem 120 mg daily, metoprolol 75 mg p.o. twice daily. 4. Hypertension-continue metoprolol, diltiazem. 5. Diabetes mellitus type 2-patient does hemoglobin A1c 6.4 in March 2021.  She is currently on Solu-Medrol, will start sliding scale insulin NovoLog.  Check hemoglobin A1c. 6. Tachybradycardia syndrome s/p pacemaker placement-patient has pacemaker in place, she is having episodes of bradycardia.  Will consult cardiology to help with assessing functioning of pacemaker.     COVID-19 Labs  No results for input(s): DDIMER, FERRITIN, LDH, CRP in the last 72 hours.  Lab Results   Component Value Date   SARSCOV2NAA NEGATIVE January 14, 2020   SARSCOV2NAA NEGATIVE 05/16/2019   SARSCOV2NAA NEGATIVE 02/21/2019   SARSCOV2NAA Not Detected 02/08/2019     Scheduled medications:   . atorvastatin  20 mg Oral QPM  . citalopram  40 mg Oral Daily  . diltiazem  120 mg Oral Daily  . enoxaparin (LOVENOX) injection  40 mg Subcutaneous Q24H  . furosemide  40 mg Oral Daily  . ipratropium-albuterol  3 mL Nebulization Q6H  . melatonin  10 mg Oral QHS  . methylPREDNISolone (SOLU-MEDROL) injection  60 mg Intravenous Q6H  . metoprolol tartrate  75 mg Oral BID  . pantoprazole  40 mg Oral Daily  . sodium chloride flush  3 mL Intravenous Q12H  . sucralfate  1 g Oral TID AC         CBG: No results for input(s): GLUCAP in the last 168 hours.  SpO2: 91 % O2 Flow Rate (L/min): 3 L/min    CBC: Recent Labs  Lab 01/14/2020 1722 2020-01-14 2007 01/14/2020 2027 01/10/20 0423  WBC 12.8*  --   --  8.4  NEUTROABS 9.3*  --   --   --   HGB 10.5* 12.2 12.6 10.3*  HCT 37.6 36.0 37.0 36.4  MCV 93.3  --   --  92.6  PLT 210  --   --  166    Basic Metabolic Panel: Recent Labs  Lab 2020-01-14 1722 01/14/2020 2007 2020/01/14 2027 01/10/20 0423  NA 137 140 138 139  K 4.9 4.5 4.4 4.8  CL 95*  --   --  95*  CO2 34*  --   --  34*  GLUCOSE 124*  --   --  171*  BUN 16  --   --  14  CREATININE 1.27*  --   --  1.15*  CALCIUM 9.5  --   --  9.6     Liver Function Tests: Recent Labs  Lab 01/10/2020 1722  AST 17  ALT 7  ALKPHOS 46  BILITOT 0.7  PROT 6.7  ALBUMIN 3.7     Antibiotics: Anti-infectives (From admission, onward)   None       DVT prophylaxis: Lovenox  Code Status: Full code  Family Communication: No family at bedside   Consultants:    Procedures:      Objective   Vitals:   01/10/20 0313 01/10/20 0722 01/10/20 0800 01/10/20 0854  BP: 116/71 (!) 132/91 (!) 128/114   Pulse: 89 (!) 108 93   Resp: (!) 22 20 (!) 22   Temp: 98.2 F (36.8 C) 98.1 F (36.7  C) 98.1 F (36.7 C)   TempSrc: Oral Oral    SpO2: 91% 90% 93% 91%  Weight: 91.5 kg     Height:        Intake/Output Summary (Last 24 hours) at 01/10/2020 1222 Last data filed at 01/10/2020 0600 Gross per 24 hour  Intake 50 ml  Output 700 ml  Net -650 ml    11/08 1901 - 11/10 0700 In: 50 [P.O.:50] Out: 700 [Urine:700]  Filed Weights   01/11/2020 1723 01/10/20 0055 01/10/20 0313  Weight: 91.6 kg 91.5 kg 91.5 kg    Physical Examination:    General: Appears in no acute distress  Cardiovascular: S1-S2, regular  Respiratory: Decreased breath sounds bilaterally  Abdomen: Abdomen is soft, nontender, no organomegaly  Extremities: Bilateral 1+ pitting edema of the lower extremities  Neurologic: Cranial nerves II through XII grossly intact, no focal deficit noted   Status is: Inpatient  Dispo: The patient is from: Home              Anticipated d/c is to: Home              Anticipated d/c date is: 01/12/2020              Patient currently not medically stable for discharge  Barrier to discharge-ongoing treatment for acute hypercapnic respiratory failure            Data Reviewed:   Recent Results (from the past 240 hour(s))  Respiratory Panel by RT PCR (Flu A&B, Covid) - Nasopharyngeal Swab     Status: None   Collection Time: 01/06/2020  9:12 PM   Specimen: Nasopharyngeal Swab  Result Value Ref Range Status   SARS Coronavirus 2 by RT PCR NEGATIVE NEGATIVE Final    Comment: (NOTE) SARS-CoV-2 target nucleic acids are NOT DETECTED.  The SARS-CoV-2 RNA is generally detectable in upper respiratoy specimens during the acute phase of infection. The lowest concentration of SARS-CoV-2 viral copies this assay can detect is 131 copies/mL. A negative result does not preclude SARS-Cov-2 infection and should not be used as the sole basis for treatment or other patient management decisions. A negative result may occur with  improper specimen collection/handling,  submission of specimen other than nasopharyngeal swab, presence of viral mutation(s) within the areas targeted by this assay, and inadequate number of viral copies (<131 copies/mL). A negative result must be combined with clinical observations, patient history, and epidemiological information. The expected result is Negative.  Fact Sheet  for Patients:  https://www.moore.com/  Fact Sheet for Healthcare Providers:  https://www.young.biz/  This test is no t yet approved or cleared by the [place] FDA and  has been authorized for detection and/or diagnosis of SARS-CoV-2 by FDA under an Emergency Use Authorization (EUA). This EUA will remain  in effect (meaning this test can be used) for the duration of the COVID-19 declaration under Section 564(b)(1) of the Act, 21 U.S.C. section 360bbb-3(b)(1), unless the authorization is terminated or revoked sooner.     Influenza A by PCR NEGATIVE NEGATIVE Final   Influenza B by PCR NEGATIVE NEGATIVE Final    Comment: (NOTE) The Xpert Xpress SARS-CoV-2/FLU/RSV assay is intended as an aid in  the diagnosis of influenza from Nasopharyngeal swab specimens and  should not be used as a sole basis for treatment. Nasal washings and  aspirates are unacceptable for Xpert Xpress SARS-CoV-2/FLU/RSV  testing.  Fact Sheet for Patients: https://www.moore.com/  Fact Sheet for Healthcare Providers: https://www.young.biz/  This test is not yet approved or cleared by the [place] FDA and  has been authorized for detection and/or diagnosis of SARS-CoV-2 by  FDA under an Emergency Use Authorization (EUA). This EUA will remain  in effect (meaning this test can be used) for the duration of the  Covid-19 declaration under Section 564(b)(1) of the Act, 21  U.S.C. section 360bbb-3(b)(1), unless the authorization is  terminated or revoked. Performed at [facility], [address], [place], [place] [address]   MRSA PCR Screening     Status: None   Collection Time: 01/10/20 12:58 AM   Specimen: Nasal Mucosa; Nasopharyngeal  Result Value Ref Range Status   MRSA by PCR NEGATIVE NEGATIVE Final    Comment:        The GeneXpert MRSA Assay (FDA approved for NASAL specimens only), is one component of a comprehensive MRSA colonization surveillance program. It is not intended to diagnose MRSA infection nor to guide or monitor treatment for MRSA infections. Performed at [facility], [address], [place], [place] [address]     No results for input(s): LIPASE, AMYLASE in the last 168 hours. No results for input(s): AMMONIA in the last 168 hours.  Cardiac Enzymes: No results for input(s): CKTOTAL, CKMB, CKMBINDEX, TROPONINI in the last 168 hours. BNP (last 3 results) Recent Labs    02/21/19 2213 05/16/19 1840 01/06/2020 1722  BNP 484.5* 1,338.5* 471.2*    ProBNP (last 3 results) No results for input(s): PROBNP in the last 8760 hours.  Studies:  DG Chest Port 1 View  Result Date: 01/20/2020 CLINICAL DATA:  Shortness of breath EXAM: PORTABLE CHEST 1 VIEW COMPARISON:  Chest radiograph May 16, 2019; chest CT May 23, 2019 FINDINGS: There is atelectatic change in the mid lung and bibasilar regions. There is no edema or airspace opacity. There is cardiomegaly with pulmonary vascularity normal. Pacemaker leads are attached to the right atrium and right ventricle. Patient is status post coronary artery bypass grafting. There is aortic atherosclerosis. No adenopathy. No bone lesions. IMPRESSION: Areas of patchy atelectasis. No edema or airspace opacity. Stable cardiomegaly. Pacemaker leads attached to right atrium and right ventricle. Status post coronary artery bypass grafting. Aortic Atherosclerosis (ICD10-I70.0). Electronically Signed   By: Bretta Bang III M.D.   On: 01/11/2020 17:38       Zerah Hilyer S Helios Kohlmann   [facility] If 7PM-7AM,  please contact night-coverage at [email or web address], Office  [phone]   01/10/2020, 12:22 PM  LOS: 0 days

## 2020-01-10 NOTE — TOC Transition Note (Signed)
Transition of Care [facility]) - CM/SW Discharge Note   Patient Details  Name: Gabrielle [name] MRN: [id number] Date of Birth: Nov 07, 1945  [facility] CM/SW Contact:  Leone Haven, RN Phone Number: 01/10/2020, 3:27 PM   Clinical Narrative:    NCM spoke with patient, offered choice, she states she does not have a preference, NCM made referral to Amy with [facility] for HHPT, she states she can take referral . Soc will begin 24 to 48 hrs post dc.  Patient also needs rollator, and she is ok with [employer], NCM made referral to [facility] with [facility], also put in order for eval for Oxygen Conserving Device at home.  NCM asked MD for HHPT order.    Final next level of care: Home w Home Health Services Barriers to Discharge: Continued Medical Work up   Patient Goals and CMS Choice Patient states their goals for this hospitalization and ongoing recovery are:: to get better CMS Medicare.gov Compare Post Acute Care list provided to:: Patient Choice offered to / list presented to : Patient  Discharge Placement                       Discharge Plan and Services                DME Arranged: Walker rolling with seat DME Agency: [employer] Date DME Agency Contacted: 01/10/20 Time DME Agency Contacted: [phone] Representative spoke with at DME Agency: Ian Malkin HH Arranged: PT HH Agency: [facility] Date [facility] Agency Contacted: 01/10/20 Time HH Agency Contacted: 1526 Representative spoke with at [facility] Agency: Amy  Social Determinants of Health (SDOH) Interventions     Readmission Risk Interventions No flowsheet data found.

## 2020-01-11 LAB — GLUCOSE, CAPILLARY
Glucose-Capillary: 230 mg/dL — ABNORMAL HIGH (ref 70–99)
Glucose-Capillary: 215 mg/dL — ABNORMAL HIGH (ref 70–99)
Glucose-Capillary: 153 mg/dL — ABNORMAL HIGH (ref 70–99)
Glucose-Capillary: 160 mg/dL — ABNORMAL HIGH (ref 70–99)

## 2020-01-11 LAB — BASIC METABOLIC PANEL
Anion gap: 10 (ref 5–15)
BUN: 14 mg/dL (ref 8–23)
CO2: 35 mmol/L — ABNORMAL HIGH (ref 22–32)
Calcium: 9.8 mg/dL (ref 8.9–10.3)
Chloride: 96 mmol/L — ABNORMAL LOW (ref 98–111)
Creatinine, Ser: 1.16 mg/dL — ABNORMAL HIGH (ref 0.44–1.00)
GFR, Estimated: 49 mL/min — ABNORMAL LOW (ref >60)
Glucose, Bld: 174 mg/dL — ABNORMAL HIGH (ref 70–99)
Potassium: 4 mmol/L (ref 3.5–5.1)
Sodium: 141 mmol/L (ref 135–145)

## 2020-01-11 LAB — TROPONIN I (HIGH SENSITIVITY)
Troponin I (High Sensitivity): 12 ng/L (ref <18)
Troponin I (High Sensitivity): 18 ng/L — ABNORMAL HIGH (ref <18)

## 2020-01-11 MED ORDER — IPRATROPIUM-ALBUTEROL 0.5-2.5 (3) MG/3ML IN SOLN
3.0000 mL | Freq: Two times a day (BID) | RESPIRATORY_TRACT | Status: DC
  Administered 2020-01-12 – 2020-01-15 (×7): 3 mL via RESPIRATORY_TRACT
  Filled 2020-01-11 (×8): qty 3

## 2020-01-11 MED ORDER — METHYLPREDNISOLONE SODIUM SUCC 125 MG IJ SOLR
60.0000 mg | Freq: Two times a day (BID) | INTRAMUSCULAR | Status: DC
  Administered 2020-01-11 – 2020-01-16 (×10): 60 mg via INTRAVENOUS
  Filled 2020-01-11 (×10): qty 2

## 2020-01-11 MED ORDER — ALPRAZOLAM 0.25 MG PO TABS
0.2500 mg | ORAL_TABLET | Freq: Three times a day (TID) | ORAL | Status: DC | PRN
  Administered 2020-01-11 – 2020-01-17 (×14): 0.25 mg via ORAL
  Filled 2020-01-11 (×14): qty 1

## 2020-01-11 MED ORDER — FUROSEMIDE 10 MG/ML IJ SOLN
40.0000 mg | Freq: Two times a day (BID) | INTRAMUSCULAR | Status: DC
  Administered 2020-01-11 (×2): 40 mg via INTRAVENOUS
  Filled 2020-01-11 (×2): qty 4

## 2020-01-11 NOTE — Progress Notes (Signed)
Physical Therapy Treatment Patient Details Name: Gabrielle [name] MRN: [id number] DOB: 1945/07/13 Today's Date: 01/11/2020    History of Present Illness 74 year old female with history of COPD, HFpEF, atrial fibrillation, anemia, QT prolongation, CAD s/p MI and CABG x7, hypertension, tachybradycardia syndrome status post pacemaker presented with progressive dyspnea patient usually is on 2 L of oxygen at home for COPD.  And was requiring 5 L of oxygen.  ABG showed PCO2 of 71, pH 7.32.    PT Comments    Pt initially asked PT to come back tomorrow, however pt educated on need to demonstrate mobility to be able to return home. Pt is limited in safe mobility by increased deconditioning and decreased endurance. Pt is min guard for bed mobility, and transfers and min A for ambulation of 50 feet with RW. Pt requires standing rest break due to 4/4 DoE, SaO2 92%O2 on 6L O2. Based on ambulation today PT recommends SNF level rehab at discharge as she has 6 steps to get to the main level and lives alone. Pt will likely refuse, states she will need ambulance transfer home. Pt states her sister will stay with her.     Follow Up Recommendations  Supervision/Assistance - 24 hour;SNF     Equipment Recommendations  Other (comment) (Rollator)       Precautions / Restrictions Precautions Precautions: Fall Precaution Comments: monitor O2 Restrictions Weight Bearing Restrictions: No    Mobility  Bed Mobility Overal bed mobility: Needs Assistance Bed Mobility: Supine to Sit;Sit to Supine     Supine to sit: Supervision;HOB elevated Sit to supine: Min guard;HOB elevated   General bed mobility comments: supervision to come to EoB with heavy use of bed rail. min guard for pt as she uses momentum to get LE back into bed  Transfers Overall transfer level: Needs assistance Equipment used: None Transfers: Sit to/from Stand Sit to Stand: Min guard         General transfer comment: min guard to power  up and steady in RW   Ambulation/Gait Ambulation/Gait assistance: Min assist Gait Distance (Feet): 50 Feet (requires 1x standing rest break) Assistive device: Rolling walker (2 wheeled) Gait Pattern/deviations: Step-through pattern;Decreased step length - right;Decreased step length - left;Trunk flexed     General Gait Details: min A for steadying with RW, constant vc for upright posture and proximity to RW           Balance Overall balance assessment: Needs assistance Sitting-balance support: No upper extremity supported;Feet supported Sitting balance-Leahy Scale: Good     Standing balance support: Bilateral upper extremity supported Standing balance-Leahy Scale: Fair                              Cognition Arousal/Alertness: Awake/alert Behavior During Therapy: WFL for tasks assessed/performed Overall Cognitive Status: Within Functional Limits for tasks assessed                                           General Comments General comments (skin integrity, edema, etc.): Pt on 5L O2 via [place] SaO2 96%O2, ambulated on 6 L O2 via  and able to maintain SaO2 >92%O2, despite 4/4 DoE with ambulation, max noted HER 109bpm      Pertinent Vitals/Pain Pain Assessment: No/denies pain           PT Goals (current goals can  now be found in the care plan section) Acute Rehab PT Goals Patient Stated Goal: Wants to move a little better PT Goal Formulation: With patient Time For Goal Achievement: 01/24/20 Potential to Achieve Goals: Good Progress towards PT goals: Progressing toward goals    Frequency    Min 3X/week      PT Plan Discharge plan needs to be updated       AM-PAC PT "6 Clicks" Mobility   Outcome Measure  Help needed turning from your back to your side while in a flat bed without using bedrails?: None Help needed moving from lying on your back to sitting on the side of a flat bed without using bedrails?: A Little Help needed  moving to and from a bed to a chair (including a wheelchair)?: A Little Help needed standing up from a chair using your arms (e.g., wheelchair or bedside chair)?: A Little Help needed to walk in hospital room?: A Little Help needed climbing 3-5 steps with a railing? : A Lot 6 Click Score: 18    End of Session Equipment Utilized During Treatment: Gait belt;Oxygen Activity Tolerance: Patient limited by fatigue Patient left: in bed;with call bell/phone within reach;with bed alarm set Nurse Communication: Mobility status PT Visit Diagnosis: Unsteadiness on feet (R26.81);Difficulty in walking, not elsewhere classified (R26.2);Muscle weakness (generalized) (M62.81)     Time: 8366-2947 PT Time Calculation (min) (ACUTE ONLY): 38 min  Charges:  $Gait Training: 8-22 mins $Therapeutic Activity: 8-22 mins                     Carnesha Maravilla B. Beverely Risen PT, DPT Acute Rehabilitation Services Pager [phone] Office [phone]    [name] 01/11/2020, 3:32 PM

## 2020-01-11 NOTE — Telephone Encounter (Signed)
° ° °  Pt is calling back, she would like to speak with Pam. She said if Dr. Anne Fu can see her in the hospital

## 2020-01-11 NOTE — Progress Notes (Signed)
[facility]  PROGRESS NOTE  Gabrielle [name] UVO:[id number] DOB: 1945-03-11 DOA: 2020-01-24 PCP: Henrine Screws, MD   Brief HPI:   74 year old female with history of COPD, HFpEF, atrial fibrillation, anemia, QT prolongation, CAD s/p MI and CABG x7, hypertension, tachybradycardia syndrome status post pacemaker presented with progressive dyspnea patient usually is on 2 L of oxygen at home for COPD.  And was requiring 5 L of oxygen.  ABG showed PCO2 of 71, pH 7.32. BNP was elevated at 471, patient started on IV Lasix.   Subjective   Patient seen and examined, did not sleep well last night.  Complains of shortness of breath.  IV Lasix was transitioned to p.o. Lasix yesterday.   Assessment/Plan:     1. Acute hypercapnic respiratory failure-patient has underlying COPD and is on 2 L of oxygen at home chronically.  She presented with COPD exacerbation, received Solu-Medrol in the ED. started on DuoNeb nebulizer every 6 hours, Solu-Medrol 60 mg IV every 6 hours.  Will continue with duo nebs and switch Solu-Medrol to 60 mg IV every 12 hours.  Continue to wean off oxygen as tolerated.   2. Acute on chronic diastolic CHF-echocardiogram from 2020 showed grade 2 diastolic dysfunction, EF 50 to 55%.  Patient received Lasix 40 mg IV every 12 hours.  She was transitioned to Lasix 40 mg daily yesterday.  She appears volume overloaded today.  Will restart Lasix 40 mg IV every 12 hours.  Continue monitoring strict intake and output.  Daily BMP.  3. Chronic atrial fibrillation-patient is not on anticoagulant due to history of GI bleed, continue diltiazem 120 mg daily, metoprolol 75 mg p.o. twice daily. 4. Hypertension-continue metoprolol, diltiazem. 5. Diabetes mellitus type 2-patient does hemoglobin A1c 6.4 in March 2021.  She is currently on Solu-Medrol, will start sliding scale insulin NovoLog.  Check hemoglobin A1c. 6. Tachybradycardia syndrome s/p pacemaker placement-patient has pacemaker in place,  she is having episodes of bradycardia.  Cardiology was consulted, EP reviewed EKG and telemetry strips.  Pacemaker is functioning fine as per cardiology.     COVID-19 Labs  No results for input(s): DDIMER, FERRITIN, LDH, CRP in the last 72 hours.  Lab Results  Component Value Date   SARSCOV2NAA NEGATIVE 01/24/2020   SARSCOV2NAA NEGATIVE 05/16/2019   SARSCOV2NAA NEGATIVE 02/21/2019   SARSCOV2NAA Not Detected 02/08/2019     Scheduled medications:   . atorvastatin  20 mg Oral QPM  . citalopram  40 mg Oral Daily  . diltiazem  120 mg Oral Daily  . enoxaparin (LOVENOX) injection  40 mg Subcutaneous Q24H  . furosemide  40 mg Intravenous Q12H  . insulin aspart  0-9 Units Subcutaneous TID WC  . ipratropium-albuterol  3 mL Nebulization Q6H  . mouth rinse  15 mL Mouth Rinse BID  . melatonin  10 mg Oral QHS  . methylPREDNISolone (SOLU-MEDROL) injection  60 mg Intravenous Q12H  . metoprolol tartrate  75 mg Oral BID  . pantoprazole  40 mg Oral Daily  . sodium chloride flush  3 mL Intravenous Q12H  . sucralfate  1 g Oral TID AC         CBG: Recent Labs  Lab 01/10/20 1537 01/10/20 2128 01/11/20 0628  GLUCAP 173* 169* 230*    SpO2: 92 % O2 Flow Rate (L/min): 4 L/min    CBC: Recent Labs  Lab 01-24-2020 1722 January 24, 2020 2007 Jan 24, 2020 2027 01/10/20 0423  WBC 12.8*  --   --  8.4  NEUTROABS 9.3*  --   --   --  HGB 10.5* 12.2 12.6 10.3*  HCT 37.6 36.0 37.0 36.4  MCV 93.3  --   --  92.6  PLT 210  --   --  166    Basic Metabolic Panel: Recent Labs  Lab 01/04/2020 1722 01/21/2020 2007 01/16/2020 2027 01/10/20 0423 01/11/20 0055  NA 137 140 138 139 141  K 4.9 4.5 4.4 4.8 4.0  CL 95*  --   --  95* 96*  CO2 34*  --   --  34* 35*  GLUCOSE 124*  --   --  171* 174*  BUN 16  --   --  14 14  CREATININE 1.27*  --   --  1.15* 1.16*  CALCIUM 9.5  --   --  9.6 9.8     Liver Function Tests: Recent Labs  Lab 01/20/2020 1722  AST 17  ALT 7  ALKPHOS 46  BILITOT 0.7  PROT 6.7   ALBUMIN 3.7     Antibiotics: Anti-infectives (From admission, onward)   None       DVT prophylaxis: Lovenox  Code Status: Full code  Family Communication: No family at bedside   Consultants:    Procedures:      Objective   Vitals:   01/10/20 2329 01/11/20 0400 01/11/20 0421 01/11/20 0750  BP: (!) 138/98 (!) 133/94  (!) 153/85  Pulse: 91 (!) 106  95  Resp: 19 14  20   Temp: 98.3 F (36.8 C) 98.1 F (36.7 C)  98.3 F (36.8 C)  TempSrc: Oral Oral  Oral  SpO2: 96% 91%  92%  Weight:   89.7 kg   Height:        Intake/Output Summary (Last 24 hours) at 01/11/2020 1128 Last data filed at 01/11/2020 0800 Gross per 24 hour  Intake 850 ml  Output 1050 ml  Net -200 ml    11/09 1901 - 11/11 0700 In: 660 [P.O.:650; I.V.:10] Out: 1750 [Urine:1750]  Filed Weights   01/10/20 0055 01/10/20 0313 01/11/20 0421  Weight: 91.5 kg 91.5 kg 89.7 kg    Physical Examination:   General-appears in no acute distress  Heart-S1-S2, regular, no murmur auscultated  Lungs-bibasilar crackles auscultated  Abdomen-soft, nontender, no organomegaly  Extremities-bilateral 1+ pitting edema of the lower extremities  Neuro-alert, oriented x3, no focal deficit noted   Status is: Inpatient  Dispo: The patient is from: Home              Anticipated d/c is to: Home              Anticipated d/c date is: 01/12/2020              Patient currently not medically stable for discharge  Barrier to discharge-ongoing treatment for acute hypercapnic respiratory failure            Data Reviewed:   Recent Results (from the past 240 hour(s))  Respiratory Panel by RT PCR (Flu A&B, Covid) - Nasopharyngeal Swab     Status: None   Collection Time: 01/15/2020  9:12 PM   Specimen: Nasopharyngeal Swab  Result Value Ref Range Status   SARS Coronavirus 2 by RT PCR NEGATIVE NEGATIVE Final    Comment: (NOTE) SARS-CoV-2 target nucleic acids are NOT DETECTED.  The SARS-CoV-2 RNA is  generally detectable in upper respiratoy specimens during the acute phase of infection. The lowest concentration of SARS-CoV-2 viral copies this assay can detect is 131 copies/mL. A negative result does not preclude SARS-Cov-2 infection and should not be used  as the sole basis for treatment or other patient management decisions. A negative result may occur with  improper specimen collection/handling, submission of specimen other than nasopharyngeal swab, presence of viral mutation(s) within the areas targeted by this assay, and inadequate number of viral copies (<131 copies/mL). A negative result must be combined with clinical observations, patient history, and epidemiological information. The expected result is Negative.  Fact Sheet for Patients:  https://www.moore.com/  Fact Sheet for Healthcare Providers:  https://www.young.biz/  This test is no t yet approved or cleared by the [place] FDA and  has been authorized for detection and/or diagnosis of SARS-CoV-2 by FDA under an Emergency Use Authorization (EUA). This EUA will remain  in effect (meaning this test can be used) for the duration of the COVID-19 declaration under Section 564(b)(1) of the Act, 21 U.S.C. section 360bbb-3(b)(1), unless the authorization is terminated or revoked sooner.     Influenza A by PCR NEGATIVE NEGATIVE Final   Influenza B by PCR NEGATIVE NEGATIVE Final    Comment: (NOTE) The Xpert Xpress SARS-CoV-2/FLU/RSV assay is intended as an aid in  the diagnosis of influenza from Nasopharyngeal swab specimens and  should not be used as a sole basis for treatment. Nasal washings and  aspirates are unacceptable for Xpert Xpress SARS-CoV-2/FLU/RSV  testing.  Fact Sheet for Patients: https://www.moore.com/  Fact Sheet for Healthcare Providers: https://www.young.biz/  This test is not yet approved or cleared by the [place] FDA and  has been authorized for detection and/or diagnosis of SARS-CoV-2 by  FDA under an Emergency Use Authorization (EUA). This EUA will remain  in effect (meaning this test can be used) for the duration of the  Covid-19 declaration under Section 564(b)(1) of the Act, 21  U.S.C. section 360bbb-3(b)(1), unless the authorization is  terminated or revoked. Performed at [facility], [address] [place], [place] [address]   MRSA PCR Screening     Status: None   Collection Time: 01/10/20 12:58 AM   Specimen: Nasal Mucosa; Nasopharyngeal  Result Value Ref Range Status   MRSA by PCR NEGATIVE NEGATIVE Final    Comment:        The GeneXpert MRSA Assay (FDA approved for NASAL specimens only), is one component of a comprehensive MRSA colonization surveillance program. It is not intended to diagnose MRSA infection nor to guide or monitor treatment for MRSA infections. Performed at [facility], [address] [place], [place] [address]     No results for input(s): LIPASE, AMYLASE in the last 168 hours. No results for input(s): AMMONIA in the last 168 hours.  Cardiac Enzymes: No results for input(s): CKTOTAL, CKMB, CKMBINDEX, TROPONINI in the last 168 hours. BNP (last 3 results) Recent Labs    02/21/19 2213 05/16/19 1840 2020-01-14 1722  BNP 484.5* 1,338.5* 471.2*    ProBNP (last 3 results) No results for input(s): PROBNP in the last 8760 hours.  Studies:  DG Chest Port 1 View  Result Date: Jan 14, 2020 CLINICAL DATA:  Shortness of breath EXAM: PORTABLE CHEST 1 VIEW COMPARISON:  Chest radiograph May 16, 2019; chest CT May 23, 2019 FINDINGS: There is atelectatic change in the mid lung and bibasilar regions. There is no edema or airspace opacity. There is cardiomegaly with pulmonary vascularity normal. Pacemaker leads are attached to the right atrium and right ventricle. Patient is status post coronary artery bypass grafting. There is aortic  atherosclerosis. No adenopathy. No bone lesions. IMPRESSION: Areas of patchy atelectasis. No edema or airspace  opacity. Stable cardiomegaly. Pacemaker leads attached to right atrium and right ventricle. Status post coronary artery bypass grafting. Aortic Atherosclerosis (ICD10-I70.0). Electronically Signed   By: Bretta Bang III M.D.   On: 12-Jan-2020 17:38   ECHOCARDIOGRAM COMPLETE  Result Date: 01/10/2020    ECHOCARDIOGRAM REPORT   Patient Name:   Gabrielle [name] Date of Exam: 01/10/2020 Medical Rec #:  [id number]          Height:       62.5 in Accession #:    [id number]         Weight:       201.7 lb Date of Birth:  02/23/1946          BSA:          1.930 m Patient Age:    74 years           BP:           128/124 mmHg Patient Gender: F                  HR:           93 bpm. Exam Location:  Inpatient Procedure: 2D Echo Indications:    786.09 dyspnea  History:        Patient has prior history of Echocardiogram examinations, most                 recent 02/22/2019. CHF, Previous Myocardial Infarction, Prior                 CABG, Arrythmias:Atrial Fibrillation; Risk Factors:Hypertension,                 Dyslipidemia and Current Smoker.  Sonographer:    Celene Skeen RDCS (AE) Referring Phys: [id number] Synetta Fail  Sonographer Comments: Image acquisition challenging due to patient body habitus, Image acquisition challenging due to COPD and Image acquisition challenging due to respiratory motion. see comments IMPRESSIONS  1. Left ventricular ejection fraction, by estimation, is 55 to 60%. The left ventricle has normal function. The left ventricle has no regional wall motion abnormalities. Left ventricular diastolic parameters are indeterminate.  2. Right ventricular systolic function is normal. The right ventricular size is normal.  3. Left atrial size was mildly dilated.  4. The mitral valve is normal in structure. Mild mitral valve regurgitation. No evidence of mitral stenosis. Severe mitral annular  calcification.  5. The aortic valve is normal in structure. Aortic valve regurgitation is not visualized. No aortic stenosis is present.  6. The inferior vena cava is dilated in size with <50% respiratory variability, suggesting right atrial pressure of 15 mmHg. Comparison(s): Prior images reviewed side by side. Prior EF 50%. FINDINGS  Left Ventricle: Left ventricular ejection fraction, by estimation, is 55 to 60%. The left ventricle has normal function. The left ventricle has no regional wall motion abnormalities. The left ventricular internal cavity size was normal in size. There is  no left ventricular hypertrophy. Left ventricular diastolic parameters are indeterminate. Right Ventricle: The right ventricular size is normal. No increase in right ventricular wall thickness. Right ventricular systolic function is normal. Left Atrium: Left atrial size was mildly dilated. Right Atrium: Right atrial size was normal in size. Pericardium: There is no evidence of pericardial effusion. Mitral Valve: The mitral valve is normal in structure. Severe mitral annular calcification. Mild mitral valve regurgitation. No evidence of mitral valve stenosis. Tricuspid Valve: The tricuspid valve is normal in structure. Tricuspid valve regurgitation is mild . No  evidence of tricuspid stenosis. Aortic Valve: The aortic valve is normal in structure. Aortic valve regurgitation is not visualized. No aortic stenosis is present. Pulmonic Valve: The pulmonic valve was normal in structure. Pulmonic valve regurgitation is trivial. No evidence of pulmonic stenosis. Aorta: The aortic root is normal in size and structure. Venous: The inferior vena cava is dilated in size with less than 50% respiratory variability, suggesting right atrial pressure of 15 mmHg. IAS/Shunts: No atrial level shunt detected by color flow Doppler.  LEFT VENTRICLE PLAX 2D LVIDd:         4.80 cm LVIDs:         3.50 cm LV PW:         1.10 cm LV IVS:        1.00 cm LVOT diam:      1.80 cm LV SV:         44 LV SV Index:   23 LVOT Area:     2.54 cm  LEFT ATRIUM         Index LA diam:    4.30 cm 2.23 cm/m  AORTIC VALVE LVOT Vmax:   101.00 cm/s LVOT Vmean:  75.900 cm/s LVOT VTI:    0.172 m  AORTA Ao Root diam: 2.90 cm MITRAL VALVE                TRICUSPID VALVE MV Area (PHT): 2.42 cm     TR Peak grad:   31.6 mmHg MV Decel Time: 313 msec     TR Vmax:        281.00 cm/s MV E velocity: 131.00 cm/s                             SHUNTS                             Systemic VTI:  0.17 m                             Systemic Diam: 1.80 cm Donato Schultz MD Electronically signed by Donato Schultz MD Signature Date/Time: 01/10/2020/2:42:27 PM    Final        Meredeth Ide   [facility] If 7PM-7AM, please contact night-coverage at [email or web address], Office  [phone]   01/11/2020, 11:28 AM  LOS: 1 day

## 2020-01-11 NOTE — Progress Notes (Signed)
  ReDS Clip Diuretic Study Pt study # [id number]  Your patient has been enrolled in the ReDS Clip Diuretic Study  Was switched over to PO lasix yesterday, uop documented at net -1.75L. Weight down from 201 to 197 lbs. Reported dry weight is 189 lbs. SCr stable 1.16. ReDS reading 21% yesterday, up to 26% today.  Changes to prescribed diuretics recommended:  Agree with transitioning lasix IV 40 mg BID Provider contacted: Dr. Sharl Ma Recommendation was accepted by provider.   REDS Clip  READING= 26%  CHEST RULER = 38 Clip Station = B   Orthodema score = 1 Signs/Symptoms Score   Mild edema, no orthopnea 0 No congestion  Moderate edema, no orthopnea 1 Low-grade orthodema/congestion  Severe edema OR orthopnea 2   Moderate edema and orthopnea 3 High-grade orthodema/congestion  Severe edema AND orthopnea 4    Sharen Hones, PharmD, BCPS Phone [phone]  Please check [email or web address] for unit-specific pharmacist phone numbers

## 2020-01-11 NOTE — Telephone Encounter (Signed)
Attempted to contact pt to make her aware Dr Anne Fu is working in the office this week and not in the hospital.  NA and VM is full.  Will continue to attempt to contact her.

## 2020-01-11 NOTE — Progress Notes (Signed)
Patient walked with PT, experienced chest pain around left breast after returning back to bed, dr Sharl Ma reached, ekg performed, and lab called for stat troponin lab---patient states that pain lasted for about 5 minutes and then eased up,resolved---has hx of bypass sx about 12 years ago, has never tried nitro for chest pain in past

## 2020-01-12 ENCOUNTER — Telehealth: Payer: Self-pay | Admitting: Cardiology

## 2020-01-12 DIAGNOSIS — I480 Paroxysmal atrial fibrillation: Secondary | ICD-10-CM

## 2020-01-12 DIAGNOSIS — I5033 Acute on chronic diastolic (congestive) heart failure: Secondary | ICD-10-CM

## 2020-01-12 LAB — GLUCOSE, CAPILLARY
Glucose-Capillary: 203 mg/dL — ABNORMAL HIGH (ref 70–99)
Glucose-Capillary: 159 mg/dL — ABNORMAL HIGH (ref 70–99)
Glucose-Capillary: 147 mg/dL — ABNORMAL HIGH (ref 70–99)
Glucose-Capillary: 174 mg/dL — ABNORMAL HIGH (ref 70–99)

## 2020-01-12 LAB — BASIC METABOLIC PANEL
Anion gap: 14 (ref 5–15)
BUN: 22 mg/dL (ref 8–23)
CO2: 35 mmol/L — ABNORMAL HIGH (ref 22–32)
Calcium: 9.9 mg/dL (ref 8.9–10.3)
Chloride: 94 mmol/L — ABNORMAL LOW (ref 98–111)
Creatinine, Ser: 1.38 mg/dL — ABNORMAL HIGH (ref 0.44–1.00)
GFR, Estimated: 40 mL/min — ABNORMAL LOW (ref >60)
Glucose, Bld: 161 mg/dL — ABNORMAL HIGH (ref 70–99)
Potassium: 3.7 mmol/L (ref 3.5–5.1)
Sodium: 143 mmol/L (ref 135–145)

## 2020-01-12 LAB — GASTROINTESTINAL PANEL BY PCR, STOOL (REPLACES STOOL CULTURE)

## 2020-01-12 LAB — C DIFFICILE QUICK SCREEN W PCR REFLEX
C Diff antigen: NEGATIVE
C Diff interpretation: NOT DETECTED
C Diff toxin: NEGATIVE

## 2020-01-12 MED ORDER — FUROSEMIDE 10 MG/ML IJ SOLN
80.0000 mg | Freq: Two times a day (BID) | INTRAMUSCULAR | Status: DC
  Administered 2020-01-12: 80 mg via INTRAVENOUS
  Filled 2020-01-12: qty 8

## 2020-01-12 MED ORDER — METOPROLOL TARTRATE 50 MG PO TABS
100.0000 mg | ORAL_TABLET | Freq: Two times a day (BID) | ORAL | Status: DC
  Administered 2020-01-12 – 2020-01-18 (×12): 100 mg via ORAL
  Filled 2020-01-12 (×12): qty 2

## 2020-01-12 NOTE — Plan of Care (Signed)
  Problem: Education: Goal: Knowledge of General Education information will improve Description: Including pain rating scale, medication(s)/side effects and non-pharmacologic comfort measures 01/12/2020 1601 by Marlou Sa, RN Outcome: Progressing 01/12/2020 1601 by Marlou Sa, RN Outcome: Progressing   Problem: Health Behavior/Discharge Planning: Goal: Ability to manage health-related needs will improve Outcome: Progressing   Problem: Clinical Measurements: Goal: Ability to maintain clinical measurements within normal limits will improve Outcome: Progressing Goal: Will remain free from infection 01/12/2020 1601 by Marlou Sa, RN Outcome: Progressing 01/12/2020 1601 by Marlou Sa, RN Outcome: Progressing Goal: Diagnostic test results will improve Outcome: Progressing Goal: Respiratory complications will improve 01/12/2020 1601 by Marlou Sa, RN Outcome: Progressing 01/12/2020 1601 by Hendricks Milo D, RN Outcome: Progressing Goal: Cardiovascular complication will be avoided Outcome: Progressing   Problem: Activity: Goal: Risk for activity intolerance will decrease Outcome: Progressing   Problem: Nutrition: Goal: Adequate nutrition will be maintained 01/12/2020 1601 by Marlou Sa, RN Outcome: Progressing 01/12/2020 1601 by Hendricks Milo D, RN Outcome: Progressing   Problem: Coping: Goal: Level of anxiety will decrease 01/12/2020 1601 by Hendricks Milo D, RN Outcome: Progressing 01/12/2020 1601 by Marlou Sa, RN Outcome: Progressing   Problem: Elimination: Goal: Will not experience complications related to bowel motility Outcome: Progressing Goal: Will not experience complications related to urinary retention Outcome: Progressing   Problem: Elimination: Goal: Will not experience complications related to bowel motility Outcome: Progressing Goal: Will not experience  complications related to urinary retention Outcome: Progressing   Problem: Pain Managment: Goal: General experience of comfort will improve 01/12/2020 1601 by Marlou Sa, RN Outcome: Progressing 01/12/2020 1601 by Hendricks Milo D, RN Outcome: Progressing   Problem: Safety: Goal: Ability to remain free from injury will improve 01/12/2020 1601 by Marlou Sa, RN Outcome: Progressing 01/12/2020 1601 by Hendricks Milo D, RN Outcome: Progressing   Problem: Skin Integrity: Goal: Risk for impaired skin integrity will decrease 01/12/2020 1601 by Marlou Sa, RN Outcome: Progressing 01/12/2020 1601 by Marlou Sa, RN Outcome: Progressing

## 2020-01-12 NOTE — Progress Notes (Signed)
Occupational Therapy Treatment Patient Details Name: Gabrielle [name] MRN: [id number] DOB: 11-Mar-1945 Today's Date: 01/12/2020    History of present illness 74 year old female with history of COPD, HFpEF, atrial fibrillation, anemia, QT prolongation, CAD s/p MI and CABG x7, hypertension, tachybradycardia syndrome status post pacemaker presented with progressive dyspnea patient usually is on 2 L of oxygen at home for COPD.  And was requiring 5 L of oxygen.  ABG showed PCO2 of 71, pH 7.32.   OT comments  Pt reports not feeling well, but does not pinpoint in exactly what way. Pt ambulated from chair, around bed to return to side lying with supervision and use of RW. Maintained Sp02> 90% on 6L during ambulation, HR variable from mid 80s to 120s. Assisted pt with dressing, but she declined bathing. Will continue to follow.  Follow Up Recommendations  Home health OT    Equipment Recommendations  None recommended by OT    Recommendations for Other Services      Precautions / Restrictions Precautions Precautions: Fall Precaution Comments: monitor all vitals closely       Mobility Bed Mobility Overal bed mobility: Needs Assistance Bed Mobility: Sit to Sidelying         Sit to sidelying: Min guard    Transfers Overall transfer level: Needs assistance Equipment used: Rolling walker (2 wheeled) Transfers: Sit to/from Stand Sit to Stand: Supervision              Balance Overall balance assessment: Needs assistance Sitting-balance support: No upper extremity supported;Feet supported Sitting balance-Leahy Scale: Good     Standing balance support: Bilateral upper extremity supported Standing balance-Leahy Scale: Fair                             ADL either performed or assessed with clinical judgement   ADL Overall ADL's : Needs assistance/impaired                 Upper Body Dressing : Minimal assistance;Sitting   Lower Body Dressing: Moderate  assistance;Sit to/from stand Lower Body Dressing Details (indicate cue type and reason): mesh underwear  Toilet Transfer: Supervision/safety;Ambulation;RW   Toileting- Clothing Manipulation and Hygiene: Sit to/from stand;Minimal assistance       Functional mobility during ADLs: Supervision/safety;Rolling walker       Vision       Perception     Praxis      Cognition Arousal/Alertness: Awake/alert Behavior During Therapy: WFL for tasks assessed/performed Overall Cognitive Status: Within Functional Limits for tasks assessed                                          Exercises     Shoulder Instructions       General Comments      Pertinent Vitals/ Pain       Pain Assessment: No/denies pain  Home Living                                          Prior Functioning/Environment              Frequency  Min 2X/week        Progress Toward Goals  OT Goals(current goals can now be found in the care plan section)  Progress towards OT goals: Not progressing toward goals - comment (not feeling well today)  Acute Rehab OT Goals Patient Stated Goal: Wants to move a little better OT Goal Formulation: With patient Time For Goal Achievement: 01/24/20 Potential to Achieve Goals: Good  Plan Discharge plan remains appropriate    Co-evaluation                 AM-PAC OT "6 Clicks" Daily Activity     Outcome Measure   Help from another person eating meals?: None Help from another person taking care of personal grooming?: A Little Help from another person toileting, which includes using toliet, bedpan, or urinal?: A Little Help from another person bathing (including washing, rinsing, drying)?: A Lot Help from another person to put on and taking off regular upper body clothing?: A Little Help from another person to put on and taking off regular lower body clothing?: A Lot 6 Click Score: 17    End of Session Equipment Utilized  During Treatment: Rolling walker;Gait belt;Oxygen  OT Visit Diagnosis: Unsteadiness on feet (R26.81)   Activity Tolerance Other (comment) (pt stating she generally does not feel well)   Patient Left in bed;with call bell/phone within reach   Nurse Communication Other (comment) (RN took vitals while OT in room)        Time: [id number] OT Time Calculation (min): 21 min  Charges: OT General Charges $OT Visit: 1 Visit OT Treatments $Therapeutic Activity: 8-22 mins  Martie Round, OTR/L Acute Rehabilitation Services Pager: [phone] Office: [phone]   Evern Bio 01/12/2020, 1:36 PM

## 2020-01-12 NOTE — Telephone Encounter (Signed)
Pt said she has been in the hospital since Tuesday. The Hospital Doctor said they were going to reach out to Dr. Anne Fu. The patient was wondering if Dr. Anne Fu had spoken with one of the Hospital doctors yet.  Please call the patient

## 2020-01-12 NOTE — Telephone Encounter (Signed)
Spoke with patient who remains in the hospital.  She states she knows her voicemail is full of messages but doesn't know how to delete them.  I have attempted to  Contact her yesterday (see prior phone note)  She reports having some chest pain last night that has resolved.  She reports the MD she saw this morning has consulted with cardiology.  Advised Dr Anne Fu is not working in the hospital this week but I will notify his partners that are for further evaluation.  She was grateful for the call back and information.

## 2020-01-12 NOTE — Progress Notes (Signed)
[facility]  PROGRESS NOTE  Gabrielle [name] EHO:[id number] DOB: Jun 08, 1945 DOA: 01/14/2020 PCP: Henrine Screws, MD   Brief HPI:   74 year old female with history of COPD, HFpEF, atrial fibrillation, anemia, QT prolongation, CAD s/p MI and CABG x7, hypertension, tachybradycardia syndrome status post pacemaker presented with progressive dyspnea patient usually is on 2 L of oxygen at home for COPD.  And was requiring 5 L of oxygen.  ABG showed PCO2 of 71, pH 7.32. BNP was elevated at 471, patient started on IV Lasix.   Subjective   Patient seen and examined, continues to have shortness of breath.  Still requiring 4 L/min oxygen by nasal cannula.  She was started on Lasix 40 mg IV every 12 hours with normal urine output.  Creatinine is rising.   Assessment/Plan:     1. Acute hypercapnic respiratory failure-patient has underlying COPD and is on 2 L of oxygen at home chronically.  She presented with COPD exacerbation, received Solu-Medrol in the ED. started on DuoNeb nebulizer every 6 hours, Solu-Medrol 60 mg IV every 6 hours.  Solu-Medrol was switched to 60 mg IV every 12 hours.  Continue to wean off oxygen as tolerated.   2. ?  Acute on chronic diastolic CHF-echocardiogram from 2020 showed grade 2 diastolic dysfunction, EF 50 to 55%.  Patient was started on Lasix 40 mg IV every 12 hours for 1 day and then transition to Lasix 40 mg daily.  She appears volume overloaded yesterday so she was started back on Lasix 40 mg IV every 12 hours, however she had very minimal urine output.  Serum creatinine is rising.  I doubt that patient has acute diastolic CHF.  Echocardiogram done on 01/10/2020 again showed indeterminate diastolic dysfunction, EF is 55 to 60%.  I will consult cardiology for further management of heart failure.  3. Acute kidney injury on CKD stage III-patient baseline creatinine is around 1.2-1.3.  She came with creatinine 1.15, which has now worsened to 1.38 with diuresis.  I have  held Lasix at this time.  Cardiology has been consulted for further adjustment of diuretics.  We will continue to monitor patient's creatinine in the hospital. 4. Chronic atrial fibrillation-patient is not on anticoagulantion due to history of GI bleed, continue diltiazem 120 mg daily, metoprolol 75 mg p.o. twice daily.  Heart rate is controlled. 5. Hypertension-continue metoprolol, diltiazem. 6. Hyperglycemia due to steroids -patient's hemoglobin A1c was 6.4 in March 2021.  She is currently on Solu-Medrol, will start sliding scale insulin NovoLog.  Hemoglobin A1c as of 01/10/2020 is 6.0. 7. Tachybradycardia syndrome s/p pacemaker placement-patient has pacemaker in place, she is having episodes of bradycardia.  Cardiology was consulted, EP reviewed EKG and telemetry strips.  Pacemaker is functioning fine as per cardiology.     COVID-19 Labs  No results for input(s): DDIMER, FERRITIN, LDH, CRP in the last 72 hours.  Lab Results  Component Value Date   SARSCOV2NAA NEGATIVE 01/22/2020   SARSCOV2NAA NEGATIVE 05/16/2019   SARSCOV2NAA NEGATIVE 02/21/2019   SARSCOV2NAA Not Detected 02/08/2019     Scheduled medications:   . atorvastatin  20 mg Oral QPM  . citalopram  40 mg Oral Daily  . diltiazem  120 mg Oral Daily  . enoxaparin (LOVENOX) injection  40 mg Subcutaneous Q24H  . insulin aspart  0-9 Units Subcutaneous TID WC  . ipratropium-albuterol  3 mL Nebulization BID  . mouth rinse  15 mL Mouth Rinse BID  . melatonin  10 mg Oral QHS  . methylPREDNISolone (  SOLU-MEDROL) injection  60 mg Intravenous Q12H  . metoprolol tartrate  75 mg Oral BID  . pantoprazole  40 mg Oral Daily  . sodium chloride flush  3 mL Intravenous Q12H  . sucralfate  1 g Oral TID AC         CBG: Recent Labs  Lab 01/11/20 0628 01/11/20 1153 01/11/20 1655 01/11/20 2113 01/12/20 0634  GLUCAP 230* 215* 153* 160* 203*    SpO2: 100 % O2 Flow Rate (L/min): 5 L/min    CBC: Recent Labs  Lab Feb 06, 2020 1722  Feb 06, 2020 2007 02/06/2020 2027 01/10/20 0423  WBC 12.8*  --   --  8.4  NEUTROABS 9.3*  --   --   --   HGB 10.5* 12.2 12.6 10.3*  HCT 37.6 36.0 37.0 36.4  MCV 93.3  --   --  92.6  PLT 210  --   --  166    Basic Metabolic Panel: Recent Labs  Lab 02/06/2020 1722 Feb 06, 2020 1722 06-Feb-2020 2007 02/06/2020 2027 01/10/20 0423 01/11/20 0055 01/12/20 0100  NA 137   < > 140 138 139 141 143  K 4.9   < > 4.5 4.4 4.8 4.0 3.7  CL 95*  --   --   --  95* 96* 94*  CO2 34*  --   --   --  34* 35* 35*  GLUCOSE 124*  --   --   --  171* 174* 161*  BUN 16  --   --   --  14 14 22   CREATININE 1.27*  --   --   --  1.15* 1.16* 1.38*  CALCIUM 9.5  --   --   --  9.6 9.8 9.9   < > = values in this interval not displayed.     Liver Function Tests: Recent Labs  Lab 06-Feb-2020 1722  AST 17  ALT 7  ALKPHOS 46  BILITOT 0.7  PROT 6.7  ALBUMIN 3.7     Antibiotics: Anti-infectives (From admission, onward)   None       DVT prophylaxis: Lovenox  Code Status: Full code  Family Communication: No family at bedside   Consultants:    Procedures:      Objective   Vitals:   01/12/20 0408 01/12/20 0412 01/12/20 0720 01/12/20 0808  BP: 124/80  131/78   Pulse: (!) 103  (!) 109   Resp: 19  20   Temp: 98.1 F (36.7 C)  98.3 F (36.8 C)   TempSrc: Oral  Oral   SpO2: 93%   100%  Weight:  89 kg    Height:  5' 2.5" (1.588 m)      Intake/Output Summary (Last 24 hours) at 01/12/2020 0854 Last data filed at 01/12/2020 0408 Gross per 24 hour  Intake 603 ml  Output 500 ml  Net 103 ml    11/10 1901 - 11/12 0700 In: 853 [P.O.:840; I.V.:13] Out: 650 [Urine:650]  Filed Weights   01/10/20 0313 01/11/20 0421 01/12/20 0412  Weight: 91.5 kg 89.7 kg 89 kg    Physical Examination:  General-appears in no acute distress Heart-S1-S2, regular, no murmur auscultated Lungs-bibasilar crackles auscultated Abdomen-soft, nontender, no organomegaly Extremities-bilateral 1+ pitting edema of the lower  extremities Neuro-alert, oriented x3, no focal deficit noted  Status is: Inpatient  Dispo: The patient is from: Home              Anticipated d/c is to: Home  Anticipated d/c date is: 01/15/2020              Patient currently not medically stable for discharge  Barrier to discharge-ongoing treatment for acute hypercapnic respiratory failure            Data Reviewed:   Recent Results (from the past 240 hour(s))  Respiratory Panel by RT PCR (Flu A&B, Covid) - Nasopharyngeal Swab     Status: None   Collection Time: 01/04/2020  9:12 PM   Specimen: Nasopharyngeal Swab  Result Value Ref Range Status   SARS Coronavirus 2 by RT PCR NEGATIVE NEGATIVE Final    Comment: (NOTE) SARS-CoV-2 target nucleic acids are NOT DETECTED.  The SARS-CoV-2 RNA is generally detectable in upper respiratoy specimens during the acute phase of infection. The lowest concentration of SARS-CoV-2 viral copies this assay can detect is 131 copies/mL. A negative result does not preclude SARS-Cov-2 infection and should not be used as the sole basis for treatment or other patient management decisions. A negative result may occur with  improper specimen collection/handling, submission of specimen other than nasopharyngeal swab, presence of viral mutation(s) within the areas targeted by this assay, and inadequate number of viral copies (<131 copies/mL). A negative result must be combined with clinical observations, patient history, and epidemiological information. The expected result is Negative.  Fact Sheet for Patients:  https://www.moore.com/  Fact Sheet for Healthcare Providers:  https://www.young.biz/  This test is no t yet approved or cleared by the [place] FDA and  has been authorized for detection and/or diagnosis of SARS-CoV-2 by FDA under an Emergency Use Authorization (EUA). This EUA will remain  in effect (meaning this test can be used)  for the duration of the COVID-19 declaration under Section 564(b)(1) of the Act, 21 U.S.C. section 360bbb-3(b)(1), unless the authorization is terminated or revoked sooner.     Influenza A by PCR NEGATIVE NEGATIVE Final   Influenza B by PCR NEGATIVE NEGATIVE Final    Comment: (NOTE) The Xpert Xpress SARS-CoV-2/FLU/RSV assay is intended as an aid in  the diagnosis of influenza from Nasopharyngeal swab specimens and  should not be used as a sole basis for treatment. Nasal washings and  aspirates are unacceptable for Xpert Xpress SARS-CoV-2/FLU/RSV  testing.  Fact Sheet for Patients: https://www.moore.com/  Fact Sheet for Healthcare Providers: https://www.young.biz/  This test is not yet approved or cleared by the [place] FDA and  has been authorized for detection and/or diagnosis of SARS-CoV-2 by  FDA under an Emergency Use Authorization (EUA). This EUA will remain  in effect (meaning this test can be used) for the duration of the  Covid-19 declaration under Section 564(b)(1) of the Act, 21  U.S.C. section 360bbb-3(b)(1), unless the authorization is  terminated or revoked. Performed at [facility], [address] [place], [place] [address]   MRSA PCR Screening     Status: None   Collection Time: 01/10/20 12:58 AM   Specimen: Nasal Mucosa; Nasopharyngeal  Result Value Ref Range Status   MRSA by PCR NEGATIVE NEGATIVE Final    Comment:        The GeneXpert MRSA Assay (FDA approved for NASAL specimens only), is one component of a comprehensive MRSA colonization surveillance program. It is not intended to diagnose MRSA infection nor to guide or monitor treatment for MRSA infections. Performed at [facility], [address], [place], [place] [address]   C Difficile Quick Screen w PCR reflex     Status: None  Collection Time: 01/12/20  5:02 AM   Specimen: STOOL  Result Value Ref Range Status   C Diff antigen  NEGATIVE NEGATIVE Final   C Diff toxin NEGATIVE NEGATIVE Final   C Diff interpretation No C. difficile detected.  Final    Comment: Performed at [facility], [address] [place], [place] [address]    No results for input(s): LIPASE, AMYLASE in the last 168 hours. No results for input(s): AMMONIA in the last 168 hours.  Cardiac Enzymes: No results for input(s): CKTOTAL, CKMB, CKMBINDEX, TROPONINI in the last 168 hours. BNP (last 3 results) Recent Labs    02/21/19 2213 05/16/19 1840 01/05/2020 1722  BNP 484.5* 1,338.5* 471.2*    ProBNP (last 3 results) No results for input(s): PROBNP in the last 8760 hours.  Studies:  ECHOCARDIOGRAM COMPLETE  Result Date: 01/10/2020    ECHOCARDIOGRAM REPORT   Patient Name:   Gabrielle PalauVIRGINIA W [name] Date of Exam: 01/10/2020 Medical Rec #:  [id number]          Height:       62.5 in Accession #:    [id number]         Weight:       201.7 lb Date of Birth:  12-11-1945          BSA:          1.930 m Patient Age:    74 years           BP:           128/124 mmHg Patient Gender: F                  HR:           93 bpm. Exam Location:  Inpatient Procedure: 2D Echo Indications:    786.09 dyspnea  History:        Patient has prior history of Echocardiogram examinations, most                 recent 02/22/2019. CHF, Previous Myocardial Infarction, Prior                 CABG, Arrythmias:Atrial Fibrillation; Risk Factors:Hypertension,                 Dyslipidemia and Current Smoker.  Sonographer:    Celene SkeenVijay Shankar RDCS (AE) Referring Phys: [id number] Synetta FailALEXANDER B MELVIN  Sonographer Comments: Image acquisition challenging due to patient body habitus, Image acquisition challenging due to COPD and Image acquisition challenging due to respiratory motion. see comments IMPRESSIONS  1. Left ventricular ejection fraction, by estimation, is 55 to 60%. The left ventricle has normal function. The left ventricle has no regional wall motion abnormalities. Left ventricular diastolic  parameters are indeterminate.  2. Right ventricular systolic function is normal. The right ventricular size is normal.  3. Left atrial size was mildly dilated.  4. The mitral valve is normal in structure. Mild mitral valve regurgitation. No evidence of mitral stenosis. Severe mitral annular calcification.  5. The aortic valve is normal in structure. Aortic valve regurgitation is not visualized. No aortic stenosis is present.  6. The inferior vena cava is dilated in size with <50% respiratory variability, suggesting right atrial pressure of 15 mmHg. Comparison(s): Prior images reviewed side by side. Prior EF 50%. FINDINGS  Left Ventricle: Left ventricular ejection fraction, by estimation, is 55 to 60%. The left ventricle has normal function. The left ventricle has no regional wall motion abnormalities. The left ventricular internal cavity  size was normal in size. There is  no left ventricular hypertrophy. Left ventricular diastolic parameters are indeterminate. Right Ventricle: The right ventricular size is normal. No increase in right ventricular wall thickness. Right ventricular systolic function is normal. Left Atrium: Left atrial size was mildly dilated. Right Atrium: Right atrial size was normal in size. Pericardium: There is no evidence of pericardial effusion. Mitral Valve: The mitral valve is normal in structure. Severe mitral annular calcification. Mild mitral valve regurgitation. No evidence of mitral valve stenosis. Tricuspid Valve: The tricuspid valve is normal in structure. Tricuspid valve regurgitation is mild . No evidence of tricuspid stenosis. Aortic Valve: The aortic valve is normal in structure. Aortic valve regurgitation is not visualized. No aortic stenosis is present. Pulmonic Valve: The pulmonic valve was normal in structure. Pulmonic valve regurgitation is trivial. No evidence of pulmonic stenosis. Aorta: The aortic root is normal in size and structure. Venous: The inferior vena cava is  dilated in size with less than 50% respiratory variability, suggesting right atrial pressure of 15 mmHg. IAS/Shunts: No atrial level shunt detected by color flow Doppler.  LEFT VENTRICLE PLAX 2D LVIDd:         4.80 cm LVIDs:         3.50 cm LV PW:         1.10 cm LV IVS:        1.00 cm LVOT diam:     1.80 cm LV SV:         44 LV SV Index:   23 LVOT Area:     2.54 cm  LEFT ATRIUM         Index LA diam:    4.30 cm 2.23 cm/m  AORTIC VALVE LVOT Vmax:   101.00 cm/s LVOT Vmean:  75.900 cm/s LVOT VTI:    0.172 m  AORTA Ao Root diam: 2.90 cm MITRAL VALVE                TRICUSPID VALVE MV Area (PHT): 2.42 cm     TR Peak grad:   31.6 mmHg MV Decel Time: 313 msec     TR Vmax:        281.00 cm/s MV E velocity: 131.00 cm/s                             SHUNTS                             Systemic VTI:  0.17 m                             Systemic Diam: 1.80 cm Donato Schultz MD Electronically signed by Donato Schultz MD Signature Date/Time: 01/10/2020/2:42:27 PM    Final        Meredeth Ide   [facility] If 7PM-7AM, please contact night-coverage at [email or web address], Office  [phone]   01/12/2020, 8:54 AM  LOS: 2 days

## 2020-01-12 NOTE — Progress Notes (Signed)
  ReDS Clip Diuretic Study Pt study # [id number]  Your patient has been enrolled in the ReDS Clip Diuretic Study  ReDS reading today 23% Remains within normal range < 35% and LE edema improved, Cr slight bump Agree with holding furosemide today  Weight down from 201 to 196 lbs. Reported dry weight is 189 lbs.  Changes to prescribed diuretics recommended:  Agree with holding furosemide today Provider contacted: Dr. Sharl Ma Recommendation was accepted by provider.   REDS Clip  READING= 23%  CHEST RULER = 38 Clip Station = B   Orthodema score = 1 Signs/Symptoms Score   Mild edema, no orthopnea 0 No congestion  Moderate edema, no orthopnea 1 Low-grade orthodema/congestion  Severe edema OR orthopnea 2   Moderate edema and orthopnea 3 High-grade orthodema/congestion  Severe edema AND orthopnea 4    Leota Sauers Pharm.D. CPP, BCPS Clinical Pharmacist [phone] 01/12/2020 10:47 AM    Please check [email or web address] for unit-specific pharmacist phone numbers

## 2020-01-12 NOTE — Consult Note (Signed)
Cardiology Consultation:   Patient ID: Gabrielle [name] MRN: [id number]; DOB: December 01, 1945  Admit date: 01/22/2020 Date of Consult: 01/12/2020  Primary Care Provider: Henrine Screws, MD [facility] Cardiologist: Donato Schultz, MD  [facility] Electrophysiologist:  None    Patient Profile:   Gabrielle [name] is a 74 y.o. female with a hx of CAD s/p CABG in 2010, chronic diastolic CHF, COPD on 2L, CKD stage 3, Afib not on a/c due to GIB on Eliquis, HTN, HLD, SSS s/p PPM, tobacco use who is being seen today for the evaluation of CHF at the request of Dr. Sharl Ma.  History of Present Illness:   [name] is followed by Dr. Anne Fu. Appear she has a long history of shortness of breath. She was last seen 07/25/19 for sob. She had a previous hospitalization wherecretinine went frmo 1.03 to 1.84 and BUN increased as well. She was on IV lasix. Suspected sob was from COPD. She was taking laaix 20 mg daily. Echo 01/2019 showed LVEF 50-55%, no LVH, G2DD, mildly dilated, regional wall motion abnormalities, severely dilated LA, mild MR.  The patient was admitted 01/10/2020 for progressive dyspnea for the last 3 weeks. She was sob on exertion. Reported weight gain from 189 to 202lb. Also reported orthopnea. No chest pain, fever, chills, palpitations. In the ER she was requiring 5L of O2. Labs showed bicarb 34, creatinine stable at 1.27, stable Hgb and WBC 12. BNP elevated to 471. HS troponin negative. Respiratory panel negative. CXR showed atelectatic changes. EKG showed rate controlled Afib with left bundle morphology. ABG showed pH 7.32, PCO2 71. She was given IV lasix in the ED and admitted.    Past Medical History:  Diagnosis Date  . Acute congestive heart failure (HCC)   . Acute lower UTI 07/01/2018  . Acute on chronic diastolic CHF (congestive heart failure) /EF 55 % 08/19/2016  . Acute renal failure (ARF) (HCC)   . Acute respiratory failure (HCC) 02/21/2019  . AKI (acute kidney injury) (HCC)  02/21/2019  . Anemia   . Anemia due to blood loss, acute 10/27/2015  . Arterial hypotension   . Arthritis   . ARTHRITIS 12/13/2008   Qualifier: Diagnosis of  By: Excell Seltzer CMA, Lawson Fiscal    . Asthma   . Atrial fibrillation with controlled ventricular response (HCC) 11/2009   a. not on anticoagulation due to recurrent GI bleeding while on Eliquis.  . Atrial fibrillation with RVR (HCC) 07/02/2018  . Chronic anticoagulation   . Chronic bronchitis   . Chronic diastolic CHF (congestive heart failure) (HCC)    a. Echo 12/2015: EF 55-60% w/ Grade 3 DD  . Chronic headache   . COPD (chronic obstructive pulmonary disease) (HCC)   . Depression   . Heart attack (HCC)   . Hyperlipidemia   . Hypertension     Past Surgical History:  Procedure Laterality Date  . CAD-CABG     x7, brief post-op Atrial Fib  . CESAREAN SECTION     x2  . COLONOSCOPY WITH PROPOFOL N/A 10/24/2015   Procedure: COLONOSCOPY WITH PROPOFOL;  Surgeon: Carman Ching, MD;  Location: [facility] ENDOSCOPY;  Service: Endoscopy;  Laterality: N/A;  . CORONARY ARTERY BYPASS GRAFT  2011  . EP IMPLANTABLE DEVICE N/A 12/26/2015   Procedure: Pacemaker Implant;  Surgeon: Marinus Maw, MD;  Location: [facility] INVASIVE CV LAB;  Service: Cardiovascular;  Laterality: N/A;  . ESOPHAGOGASTRODUODENOSCOPY N/A 10/22/2015   Procedure: ESOPHAGOGASTRODUODENOSCOPY (EGD);  Surgeon: Carman Ching, MD;  Location: [facility] ENDOSCOPY;  Service:  Endoscopy;  Laterality: N/A;  . ESOPHAGOGASTRODUODENOSCOPY N/A 12/24/2015   Procedure: ESOPHAGOGASTRODUODENOSCOPY (EGD);  Surgeon: Carman Ching, MD;  Location: [facility] ENDOSCOPY;  Service: Endoscopy;  Laterality: N/A;  . JOINT REPLACEMENT Bilateral   . TONSILLECTOMY    . TOTAL KNEE ARTHROPLASTY       Home Medications:  Prior to Admission medications   Medication Sig Start Date End Date Taking? Authorizing Provider  albuterol (PROVENTIL HFA;VENTOLIN HFA) 108 (90 Base) MCG/ACT inhaler Inhale 2 puffs into the lungs every 6 (six) hours as  needed for wheezing or shortness of breath.   Yes [provider]  ALPRAZolam (XANAX) 0.25 MG tablet Take 1 tablet (0.25 mg total) by mouth at bedtime as needed for anxiety. 05/22/19  Yes Dow Adolph N, DO  atorvastatin (LIPITOR) 20 MG tablet Take 1 tablet (20 mg total) by mouth daily. Patient taking differently: Take 20 mg by mouth every evening.  11/21/19  Yes Jake Bathe, MD  budesonide-formoterol (SYMBICORT) 160-4.5 MCG/ACT inhaler Inhale 2 puffs into the lungs 2 (two) times daily. 01/15/14  Yes Storm Frisk, MD  citalopram (CELEXA) 20 MG tablet Take 40 mg by mouth in the morning.  07/08/16  Yes [provider]  clobetasol cream (TEMOVATE) 0.05 % Apply 1 application topically 2 (two) times daily. Affected skin 05/21/16  Yes Vassie Loll, MD  diltiazem [facility]) 120 MG 24 hr capsule TAKE 1 CAPSULE BY MOUTH DAILY Patient taking differently: Take 120 mg by mouth in the morning.  11/21/19  Yes Jake Bathe, MD  docusate sodium (COLACE) 100 MG capsule Take 100 mg by mouth 2 (two) times daily as needed for moderate constipation.    Yes [provider]  fluticasone (FLONASE) 50 MCG/ACT nasal spray Place 2 sprays into both nostrils daily. 12/21/15  Yes Albertine Grates, MD  furosemide (LASIX) 20 MG tablet TAKE 1 TABLET BY MOUTH DAILY Patient taking differently: Take 20 mg by mouth daily.  11/21/19  Yes Jake Bathe, MD  Melatonin 10 MG TABS Take 10 mg by mouth at bedtime.   Yes [provider]  metoprolol tartrate (LOPRESSOR) 50 MG tablet Take 1.5 tablets (75 mg total) by mouth 2 (two) times daily. 04/12/19  Yes Jake Bathe, MD  pantoprazole (PROTONIX) 40 MG tablet Take 1 tablet (40 mg total) by mouth daily. 08/16/19  Yes Jake Bathe, MD  potassium chloride (KLOR-CON) 10 MEQ tablet Take 1 tablet (10 mEq total) by mouth daily. 08/16/19  Yes Jake Bathe, MD  sucralfate (CARAFATE) 1 g tablet Take 1 g by mouth 3 (three) times daily before meals.   Yes [provider]  tiotropium (SPIRIVA) 18 MCG inhalation capsule Place 18 mcg into inhaler and inhale daily.   Yes [provider]  tiZANidine (ZANAFLEX) 4 MG tablet Take 4 mg by mouth 2 (two) times daily as needed for muscle spasms.  01/03/20  Yes [provider]    Inpatient Medications: Scheduled Meds: . atorvastatin  20 mg Oral QPM  . citalopram  40 mg Oral Daily  . diltiazem  120 mg Oral Daily  . enoxaparin (LOVENOX) injection  40 mg Subcutaneous Q24H  . insulin aspart  0-9 Units Subcutaneous TID WC  . ipratropium-albuterol  3 mL Nebulization BID  . mouth rinse  15 mL Mouth Rinse BID  . melatonin  10 mg Oral QHS  . methylPREDNISolone (SOLU-MEDROL) injection  60 mg Intravenous Q12H  . metoprolol tartrate  75 mg Oral BID  . pantoprazole  40 mg Oral Daily  . sodium chloride flush  3 mL Intravenous Q12H  . sucralfate  1 g Oral TID AC   Continuous Infusions:  PRN Meds: acetaminophen **OR** acetaminophen, ALPRAZolam, docusate sodium  Allergies:    Allergies  Allergen Reactions  . Novocain [Procaine] Hives and Palpitations  . Penicillins Hives and Other (See Comments)    Has patient had a PCN reaction causing immediate rash, facial/tongue/throat swelling, SOB or lightheadedness with hypotension: No Has patient had a PCN reaction causing severe rash involving mucus membranes or skin necrosis: No Has patient had a PCN reaction that required hospitalization No Has patient had a PCN reaction occurring within the last 10 years: No If all of the above answers are "NO", then may proceed with Cephalosporin use.    Social History:   Social History   Socioeconomic History  . Marital status: Divorced    Spouse name: Not on file  . Number of children: Not on file  . Years of education: Not on file  . Highest education level: Not on file  Occupational History  . Occupation: retired Nutritional therapistcustomer service    Employer: [employer]  Tobacco Use  . Smoking status: Current Every Day  Smoker    Packs/day: 0.20    Types: Cigarettes  . Smokeless tobacco: Never Used  . Tobacco comment: "Occasionally I will smoke a cigarette"   Vaping Use  . Vaping Use: Never used  Substance and Sexual Activity  . Alcohol use: No    Alcohol/week: 0.0 standard drinks  . Drug use: No  . Sexual activity: Not on file  Other Topics Concern  . Not on file  Social History Narrative  . Not on file   Social Determinants of Health   Financial Resource Strain:   . Difficulty of Paying Living Expenses: Not on file  Food Insecurity:   . Worried About Programme researcher, broadcasting/film/videounning Out of Food in the Last Year: Not on file  . Ran Out of Food in the Last Year: Not on file  Transportation Needs:   . Lack of Transportation (Medical): Not on file  . Lack of Transportation (Non-Medical): Not on file  Physical Activity:   . Days of Exercise per Week: Not on file  . Minutes of Exercise per Session: Not on file  Stress:   . Feeling of Stress : Not on file  Social Connections:   . Frequency of Communication with Friends and Family: Not on file  . Frequency of Social Gatherings with Friends and Family: Not on file  . Attends Religious Services: Not on file  . Active Member of Clubs or Organizations: Not on file  . Attends BankerClub or Organization Meetings: Not on file  . Marital Status: Not on file  Intimate Partner Violence:   . Fear of Current or Ex-Partner: Not on file  . Emotionally Abused: Not on file  . Physically Abused: Not on file  . Sexually Abused: Not on file    Family History:   Family History  Problem Relation Age of Onset  . Diabetes Mother   . Hypertension Mother   . Heart disease Mother        before age 76  . Heart disease Father        before age 76  . Hypertension Father   . Heart attack Father   . Lung cancer Maternal Grandfather   . Lung cancer Paternal Grandfather   . Hypertension Daughter   . Stroke Paternal Grandmother  ROS:  Please see the history of present illness.  All  other ROS reviewed and negative.     Physical Exam/Data:   Vitals:   01/12/20 0720 01/12/20 0734 01/12/20 0808 01/12/20 1004  BP: 131/78   (!) 148/100  Pulse: (!) 109 (!) 104  (!) 114  Resp: 20 18    Temp: 98.3 F (36.8 C)     TempSrc: Oral     SpO2:  91% 100%   Weight:      Height:        Intake/Output Summary (Last 24 hours) at 01/12/2020 1039 Last data filed at 01/12/2020 0408 Gross per 24 hour  Intake 603 ml  Output 500 ml  Net 103 ml   Last 3 Weights 01/12/2020 01/11/2020 01/10/2020  Weight (lbs) 196 lb 3.4 oz 197 lb 12 oz 201 lb 11.5 oz  Weight (kg) 89 kg 89.7 kg 91.5 kg     Body mass index is 35.32 kg/m.  General:  Well nourished, well developed, in no acute distress HEENT: normal Lymph: no adenopathy Neck: difficult to assess JVD Endocrine:  No thryomegaly Vascular: No carotid bruits; FA pulses 2+ bilaterally without bruits  Cardiac:  normal S1, S2; Irreg IRreg; no murmur  Lungs:  Diffusely diminished, crackles at bases, mild wheezing Abd: soft, nontender, no hepatomegaly  Ext: no edema Musculoskeletal:  No deformities, BUE and BLE strength normal and equal Skin: warm and dry  Neuro:  CNs 2-12 intact, no focal abnormalities noted Psych:  Normal affect   EKG:  The EKG was personally reviewed and demonstrates:  Afib with left bundle morphology Telemetry:  Telemetry was personally reviewed and demonstrates:  Afib, HR around 100, PVCs  Relevant CV Studies:  Echo 01/10/20 1. Left ventricular ejection fraction, by estimation, is 55 to 60%. The  left ventricle has normal function. The left ventricle has no regional  wall motion abnormalities. Left ventricular diastolic parameters are  indeterminate.  2. Right ventricular systolic function is normal. The right ventricular  size is normal.  3. Left atrial size was mildly dilated.  4. The mitral valve is normal in structure. Mild mitral valve  regurgitation. No evidence of mitral stenosis. Severe mitral  annular  calcification.  5. The aortic valve is normal in structure. Aortic valve regurgitation is  not visualized. No aortic stenosis is present.  6. The inferior vena cava is dilated in size with <50% respiratory  variability, suggesting right atrial pressure of 15 mmHg.   Comparison(s): Prior images reviewed side by side. Prior EF 50%.   Laboratory Data:  High Sensitivity Troponin:   Recent Labs  Lab 01-25-20 1722 01/25/2020 2025 01/11/20 0127 01/11/20 1717  TROPONINIHS 11 10 12  18*     Chemistry Recent Labs  Lab 01/10/20 0423 01/11/20 0055 01/12/20 0100  NA 139 141 143  K 4.8 4.0 3.7  CL 95* 96* 94*  CO2 34* 35* 35*  GLUCOSE 171* 174* 161*  BUN 14 14 22   CREATININE 1.15* 1.16* 1.38*  CALCIUM 9.6 9.8 9.9  GFRNONAA 49* 49* 40*  ANIONGAP 10 10 14     Recent Labs  Lab 01/25/2020 1722  PROT 6.7  ALBUMIN 3.7  AST 17  ALT 7  ALKPHOS 46  BILITOT 0.7   Hematology Recent Labs  Lab Jan 25, 2020 1722 January 25, 2020 1722 2020-01-25 2007 25-Jan-2020 2027 01/10/20 0423  WBC 12.8*  --   --   --  8.4  RBC 4.03  --   --   --  3.93  HGB  10.5*   < > 12.2 12.6 10.3*  HCT 37.6   < > 36.0 37.0 36.4  MCV 93.3  --   --   --  92.6  MCH 26.1  --   --   --  26.2  MCHC 27.9*  --   --   --  28.3*  RDW 15.5  --   --   --  15.5  PLT 210  --   --   --  166   < > = values in this interval not displayed.   BNP Recent Labs  Lab 25-Jan-2020 1722  BNP 471.2*    DDimer No results for input(s): DDIMER in the last 168 hours.   Radiology/Studies:  DG Chest Port 1 View  Result Date: 25-Jan-2020 CLINICAL DATA:  Shortness of breath EXAM: PORTABLE CHEST 1 VIEW COMPARISON:  Chest radiograph May 16, 2019; chest CT May 23, 2019 FINDINGS: There is atelectatic change in the mid lung and bibasilar regions. There is no edema or airspace opacity. There is cardiomegaly with pulmonary vascularity normal. Pacemaker leads are attached to the right atrium and right ventricle. Patient is status post coronary  artery bypass grafting. There is aortic atherosclerosis. No adenopathy. No bone lesions. IMPRESSION: Areas of patchy atelectasis. No edema or airspace opacity. Stable cardiomegaly. Pacemaker leads attached to right atrium and right ventricle. Status post coronary artery bypass grafting. Aortic Atherosclerosis (ICD10-I70.0). Electronically Signed   By: Bretta Bang III M.D.   On: 01-25-2020 17:38   ECHOCARDIOGRAM COMPLETE  Result Date: 01/10/2020    ECHOCARDIOGRAM REPORT   Patient Name:   JAIANNA NICOLL Date of Exam: 01/10/2020 Medical Rec #:  [id number]          Height:       62.5 in Accession #:    [id number]         Weight:       201.7 lb Date of Birth:  08-18-1945          BSA:          1.930 m Patient Age:    74 years           BP:           128/124 mmHg Patient Gender: F                  HR:           93 bpm. Exam Location:  Inpatient Procedure: 2D Echo Indications:    786.09 dyspnea  History:        Patient has prior history of Echocardiogram examinations, most                 recent 02/22/2019. CHF, Previous Myocardial Infarction, Prior                 CABG, Arrythmias:Atrial Fibrillation; Risk Factors:Hypertension,                 Dyslipidemia and Current Smoker.  Sonographer:    Celene Skeen RDCS (AE) Referring Phys: [id number] Synetta Fail  Sonographer Comments: Image acquisition challenging due to patient body habitus, Image acquisition challenging due to COPD and Image acquisition challenging due to respiratory motion. see comments IMPRESSIONS  1. Left ventricular ejection fraction, by estimation, is 55 to 60%. The left ventricle has normal function. The left ventricle has no regional wall motion abnormalities. Left ventricular diastolic parameters are indeterminate.  2. Right ventricular systolic function is normal. The right ventricular size is  normal.  3. Left atrial size was mildly dilated.  4. The mitral valve is normal in structure. Mild mitral valve regurgitation. No evidence of  mitral stenosis. Severe mitral annular calcification.  5. The aortic valve is normal in structure. Aortic valve regurgitation is not visualized. No aortic stenosis is present.  6. The inferior vena cava is dilated in size with <50% respiratory variability, suggesting right atrial pressure of 15 mmHg. Comparison(s): Prior images reviewed side by side. Prior EF 50%. FINDINGS  Left Ventricle: Left ventricular ejection fraction, by estimation, is 55 to 60%. The left ventricle has normal function. The left ventricle has no regional wall motion abnormalities. The left ventricular internal cavity size was normal in size. There is  no left ventricular hypertrophy. Left ventricular diastolic parameters are indeterminate. Right Ventricle: The right ventricular size is normal. No increase in right ventricular wall thickness. Right ventricular systolic function is normal. Left Atrium: Left atrial size was mildly dilated. Right Atrium: Right atrial size was normal in size. Pericardium: There is no evidence of pericardial effusion. Mitral Valve: The mitral valve is normal in structure. Severe mitral annular calcification. Mild mitral valve regurgitation. No evidence of mitral valve stenosis. Tricuspid Valve: The tricuspid valve is normal in structure. Tricuspid valve regurgitation is mild . No evidence of tricuspid stenosis. Aortic Valve: The aortic valve is normal in structure. Aortic valve regurgitation is not visualized. No aortic stenosis is present. Pulmonic Valve: The pulmonic valve was normal in structure. Pulmonic valve regurgitation is trivial. No evidence of pulmonic stenosis. Aorta: The aortic root is normal in size and structure. Venous: The inferior vena cava is dilated in size with less than 50% respiratory variability, suggesting right atrial pressure of 15 mmHg. IAS/Shunts: No atrial level shunt detected by color flow Doppler.  LEFT VENTRICLE PLAX 2D LVIDd:         4.80 cm LVIDs:         3.50 cm LV PW:          1.10 cm LV IVS:        1.00 cm LVOT diam:     1.80 cm LV SV:         44 LV SV Index:   23 LVOT Area:     2.54 cm  LEFT ATRIUM         Index LA diam:    4.30 cm 2.23 cm/m  AORTIC VALVE LVOT Vmax:   101.00 cm/s LVOT Vmean:  75.900 cm/s LVOT VTI:    0.172 m  AORTA Ao Root diam: 2.90 cm MITRAL VALVE                TRICUSPID VALVE MV Area (PHT): 2.42 cm     TR Peak grad:   31.6 mmHg MV Decel Time: 313 msec     TR Vmax:        281.00 cm/s MV E velocity: 131.00 cm/s                             SHUNTS                             Systemic VTI:  0.17 m                             Systemic Diam: 1.80 cm Donato Schultz MD Electronically signed by Donato Schultz  MD Signature Date/Time: 01/10/2020/2:42:27 PM    Final      Assessment and Plan:   Respiratory failure/COPD on 2L at [place] -Presented with progressive sob and weight gain, requiring 5L O2. BNP 471. CXR with no acute changes. EKG shows rate controlled afib.  - multifactorial given Acute CHF, COPD, and chronic sob - IV lasix >was held 11/11 for rising creatinine - IV steroids and duonebs - She is still on 5L O2.  Wean off O2 as tolerated. Said she ambulated yesterday and became severly sob.  Acute on Chronic diastolic CHF - Echo this admission showed no RWMA, mildly dilated LA, mild MR, dilated inferior vena cava - IV lasix 40 mg BID - Patient has had very poor Urine output per chart review however patient says she was urinating well - Weight down 202 to 196lbs. Weight in 07/2019 was 190lbs - Lasix discontinued 11/11 for rising creatinine. Creatinine up today, 1.16>1.38, BUN 22.  - crackles on exam however does not appear to be significantly volume overloaded on exam.  - Discussed with MD. Will try IV lasix 80 mg BID. If urine output doesn't improve can consider chest CT.  CKD stage 3 - creatinine rising with diuresis. Lasix held 11/11 - Baseline 1-1.2 - plan as above  Permanent afib with PPM - not on a/c due to hx of GIB - diltiazem and  metprolol for rate control. Rates around 100 bpm. Will increase metoprolol to  BID for better rate control - PPM followed by Dr. Ladona Ridgel  HTN - continue metoprolol and diltiazem - Bps reasonable.  - metoprolol increased as above  For questions or updates, please contact [facility] Please consult [email or web address] for contact info under    Signed, Archana Eckman David Stall, PA-C  01/12/2020 10:39 AM

## 2020-01-12 NOTE — Telephone Encounter (Signed)
See phone note from 11/12 for further information.

## 2020-01-13 ENCOUNTER — Inpatient Hospital Stay (HOSPITAL_COMMUNITY): Payer: Medicare Other

## 2020-01-13 DIAGNOSIS — J441 Chronic obstructive pulmonary disease with (acute) exacerbation: Secondary | ICD-10-CM

## 2020-01-13 DIAGNOSIS — I4819 Other persistent atrial fibrillation: Secondary | ICD-10-CM

## 2020-01-13 DIAGNOSIS — Z950 Presence of cardiac pacemaker: Secondary | ICD-10-CM

## 2020-01-13 DIAGNOSIS — N179 Acute kidney failure, unspecified: Secondary | ICD-10-CM

## 2020-01-13 DIAGNOSIS — I495 Sick sinus syndrome: Secondary | ICD-10-CM

## 2020-01-13 DIAGNOSIS — I2510 Atherosclerotic heart disease of native coronary artery without angina pectoris: Secondary | ICD-10-CM

## 2020-01-13 DIAGNOSIS — I5033 Acute on chronic diastolic (congestive) heart failure: Secondary | ICD-10-CM | POA: Diagnosis not present

## 2020-01-13 DIAGNOSIS — J9621 Acute and chronic respiratory failure with hypoxia: Secondary | ICD-10-CM | POA: Diagnosis not present

## 2020-01-13 LAB — GLUCOSE, CAPILLARY
Glucose-Capillary: 176 mg/dL — ABNORMAL HIGH (ref 70–99)
Glucose-Capillary: 168 mg/dL — ABNORMAL HIGH (ref 70–99)
Glucose-Capillary: 264 mg/dL — ABNORMAL HIGH (ref 70–99)
Glucose-Capillary: 135 mg/dL — ABNORMAL HIGH (ref 70–99)

## 2020-01-13 LAB — BASIC METABOLIC PANEL
Anion gap: 10 (ref 5–15)
BUN: 36 mg/dL — ABNORMAL HIGH (ref 8–23)
CO2: 39 mmol/L — ABNORMAL HIGH (ref 22–32)
Calcium: 9.5 mg/dL (ref 8.9–10.3)
Chloride: 91 mmol/L — ABNORMAL LOW (ref 98–111)
Creatinine, Ser: 1.45 mg/dL — ABNORMAL HIGH (ref 0.44–1.00)
GFR, Estimated: 37 mL/min — ABNORMAL LOW (ref >60)
Glucose, Bld: 164 mg/dL — ABNORMAL HIGH (ref 70–99)
Potassium: 3.5 mmol/L (ref 3.5–5.1)
Sodium: 140 mmol/L (ref 135–145)

## 2020-01-13 MED ORDER — DILTIAZEM HCL ER COATED BEADS 120 MG PO CP24
120.0000 mg | ORAL_CAPSULE | Freq: Once | ORAL | Status: AC
  Administered 2020-01-13: 120 mg via ORAL
  Filled 2020-01-13: qty 1

## 2020-01-13 MED ORDER — DILTIAZEM HCL ER COATED BEADS 240 MG PO CP24
240.0000 mg | ORAL_CAPSULE | Freq: Every day | ORAL | Status: DC
  Administered 2020-01-14 – 2020-01-18 (×5): 240 mg via ORAL
  Filled 2020-01-13 (×5): qty 1

## 2020-01-13 NOTE — Plan of Care (Signed)
  Problem: Clinical Measurements: Goal: Ability to maintain clinical measurements within normal limits will improve Outcome: Progressing Goal: Will remain free from infection Outcome: Progressing Goal: Diagnostic test results will improve Outcome: Progressing Goal: Cardiovascular complication will be avoided Outcome: Progressing   

## 2020-01-13 NOTE — Progress Notes (Signed)
  ReDS Clip Diuretic Study Pt study # [id number]  Your patient has been enrolled in the ReDS Clip Diuretic Study  ReDS reading today 24% Remains within normal range < 35%   LE edema improved, Cr slight bump   Weight down from 201 to 196 lbs. Reported dry weight is 189 lbs.  Changes to prescribed diuretics recommended:  Agree with holding furosemide this am  Provider contacted: Dr. Sharl Ma Recommendation was accepted by provider.   REDS Clip  READING= 24%  CHEST RULER = 38 Clip Station = B   Orthodema score = 1 Signs/Symptoms Score   Mild edema, no orthopnea 0 No congestion  Moderate edema, no orthopnea 1 Low-grade orthodema/congestion  Severe edema OR orthopnea 2   Moderate edema and orthopnea 3 High-grade orthodema/congestion  Severe edema AND orthopnea 4    Leota Sauers Pharm.D. CPP, BCPS Clinical Pharmacist [phone] 01/13/2020 10:27 AM    Please check [email or web address] for unit-specific pharmacist phone numbers

## 2020-01-13 NOTE — Progress Notes (Signed)
[facility]  PROGRESS NOTE  Gabrielle [name]:[id number] DOB: February 10, 1946 DOA: 01/08/2020 PCP: Henrine Screws, MD   Brief HPI:   74 year old female with history of COPD, HFpEF, atrial fibrillation, anemia, QT prolongation, CAD s/p MI and CABG x7, hypertension, tachybradycardia syndrome status post pacemaker presented with progressive dyspnea patient usually is on 2 L of oxygen at home for COPD.  And was requiring 5 L of oxygen.  ABG showed PCO2 of 71, pH 7.32. BNP was elevated at 471, patient started on IV Lasix.   Subjective   Patient seen and examined, feels lethargic today.  Still requiring 5 L/min oxygen via nasal cannula.   Assessment/Plan:     1. Acute hypercapnic respiratory failure-patient has underlying COPD and is on 2 L of oxygen at home chronically.  She presented with COPD exacerbation, received Solu-Medrol in the ED. started on DuoNeb nebulizer every 6 hours, Solu-Medrol 60 mg IV every 6 hours.  Solu-Medrol was switched to 60 mg IV every 12 hours.  Continue to wean off oxygen as tolerated.  Will obtain CT chest high-resolution to check for underlying pulmonary fibrosis contributing to patient's respiratory failure. 2. Diarrhea-patient had diarrhea, which is resolved.  Stool for C. difficile PCR was negative.  GI pathogen panel obtained was also negative. 3. ? Acute on chronic diastolic CHF-echocardiogram from 2020 showed grade 2 diastolic dysfunction, EF 50 to 55%.  Patient was started on Lasix 40 mg IV every 12 hours for 1 day and then transition to Lasix 40 mg daily.  She appears volume overloaded yesterday so she was started back on Lasix 40 mg IV every 12 hours, however she had very minimal urine output.  Serum creatinine is rising.  I doubt that patient has acute diastolic CHF.  Echocardiogram done on 01/10/2020 again showed indeterminate diastolic dysfunction, EF is 55 to 60%.  Cardiology was consulted, and agree that patient does not appear to be in acute diastolic  CHF.  Lasix has been discontinued. 4. Acute kidney injury on CKD stage III-patient baseline creatinine is around 1.2-1.3.  She came with creatinine 1.15, which has now worsened to 1.45 with diuresis.  Lasix will be discontinued.  Cardiology saw the patient and recommended that she does not need any more diuresis.  Follow BMP in am. 5. Chronic atrial fibrillation-patient is not on anticoagulantion due to history of GI bleed, continue diltiazem 120 mg daily, metoprolol dose has been changed to 100 mg p.o. twice daily.  Heart rate is controlled. 6. Hypertension-continue metoprolol, diltiazem. 7. Hyperglycemia due to steroids -patient's hemoglobin A1c was 6.4 in March 2021.  She is currently on Solu-Medrol, will start sliding scale insulin NovoLog.  Hemoglobin A1c as of 01/10/2020 is 6.0. 8. Tachybradycardia syndrome s/p pacemaker placement-patient has pacemaker in place, she is having episodes of bradycardia.  Cardiology was consulted, EP reviewed EKG and telemetry strips.  Pacemaker is functioning fine as per cardiology.     COVID-19 Labs  No results for input(s): DDIMER, FERRITIN, LDH, CRP in the last 72 hours.  Lab Results  Component Value Date   SARSCOV2NAA NEGATIVE 01/20/2020   SARSCOV2NAA NEGATIVE 05/16/2019   SARSCOV2NAA NEGATIVE 02/21/2019   SARSCOV2NAA Not Detected 02/08/2019     Scheduled medications:   . atorvastatin  20 mg Oral QPM  . citalopram  40 mg Oral Daily  . [START ON 01/14/2020] diltiazem  240 mg Oral Daily  . enoxaparin (LOVENOX) injection  40 mg Subcutaneous Q24H  . furosemide  80 mg Intravenous BID  . insulin  aspart  0-9 Units Subcutaneous TID WC  . ipratropium-albuterol  3 mL Nebulization BID  . mouth rinse  15 mL Mouth Rinse BID  . melatonin  10 mg Oral QHS  . methylPREDNISolone (SOLU-MEDROL) injection  60 mg Intravenous Q12H  . metoprolol tartrate  100 mg Oral BID  . pantoprazole  40 mg Oral Daily  . sodium chloride flush  3 mL Intravenous Q12H  .  sucralfate  1 g Oral TID AC         CBG: Recent Labs  Lab 01/12/20 1104 01/12/20 1721 01/12/20 2103 01/13/20 0558 01/13/20 1128  GLUCAP 159* 147* 174* 176* 168*    SpO2: 92 % O2 Flow Rate (L/min): 5 L/min FiO2 (%): 40 %    CBC: Recent Labs  Lab 01/16/2020 1722 01/19/2020 2007 01/06/2020 2027 01/10/20 0423  WBC 12.8*  --   --  8.4  NEUTROABS 9.3*  --   --   --   HGB 10.5* 12.2 12.6 10.3*  HCT 37.6 36.0 37.0 36.4  MCV 93.3  --   --  92.6  PLT 210  --   --  166    Basic Metabolic Panel: Recent Labs  Lab 01/08/2020 1722 01/23/2020 2007 01/17/2020 2027 01/10/20 0423 01/11/20 0055 01/12/20 0100 01/13/20 0111  NA 137   < > 138 139 141 143 140  K 4.9   < > 4.4 4.8 4.0 3.7 3.5  CL 95*  --   --  95* 96* 94* 91*  CO2 34*  --   --  34* 35* 35* 39*  GLUCOSE 124*  --   --  171* 174* 161* 164*  BUN 16  --   --  14 14 22  36*  CREATININE 1.27*  --   --  1.15* 1.16* 1.38* 1.45*  CALCIUM 9.5  --   --  9.6 9.8 9.9 9.5   < > = values in this interval not displayed.     Liver Function Tests: Recent Labs  Lab 01/24/2020 1722  AST 17  ALT 7  ALKPHOS 46  BILITOT 0.7  PROT 6.7  ALBUMIN 3.7     Antibiotics: Anti-infectives (From admission, onward)   None       DVT prophylaxis: Lovenox  Code Status: Full code  Family Communication: No family at bedside   Consultants:    Procedures:      Objective   Vitals:   01/13/20 0716 01/13/20 0829 01/13/20 0910 01/13/20 1128  BP: (!) 131/94  133/78 126/88  Pulse: 94 (!) 114 (!) 115 (!) 105  Resp: (!) 21 20  18   Temp: 97.9 F (36.6 C)   98.4 F (36.9 C)  TempSrc: Oral   Oral  SpO2: 94%   92%  Weight:      Height:        Intake/Output Summary (Last 24 hours) at 01/13/2020 1242 Last data filed at 01/13/2020 0900 Gross per 24 hour  Intake 240 ml  Output 825 ml  Net -585 ml    11/11 1901 - 11/13 0700 In: 243 [P.O.:240; I.V.:3] Out: 1125 [Urine:1125]  Filed Weights   01/10/20 0313 01/11/20 0421  01/12/20 0412  Weight: 91.5 kg 89.7 kg 89 kg    Physical Examination:  General-appears in no acute distress Heart-S1-S2, regular, no murmur auscultated Lungs-bibasilar crackles Abdomen-soft, nontender, no organomegaly Extremities-bilateral trace edema in the lower extremities Neuro-alert, oriented x3, no focal deficit noted  Status is: Inpatient  Dispo: The patient is from: Home  Anticipated d/c is to: Home              Anticipated d/c date is: 01/15/2020              Patient currently not medically stable for discharge  Barrier to discharge-ongoing treatment for acute hypercapnic respiratory failure       Data Reviewed:   Recent Results (from the past 240 hour(s))  Respiratory Panel by RT PCR (Flu A&B, Covid) - Nasopharyngeal Swab     Status: None   Collection Time: 01/24/2020  9:12 PM   Specimen: Nasopharyngeal Swab  Result Value Ref Range Status   SARS Coronavirus 2 by RT PCR NEGATIVE NEGATIVE Final    Comment: (NOTE) SARS-CoV-2 target nucleic acids are NOT DETECTED.  The SARS-CoV-2 RNA is generally detectable in upper respiratoy specimens during the acute phase of infection. The lowest concentration of SARS-CoV-2 viral copies this assay can detect is 131 copies/mL. A negative result does not preclude SARS-Cov-2 infection and should not be used as the sole basis for treatment or other patient management decisions. A negative result may occur with  improper specimen collection/handling, submission of specimen other than nasopharyngeal swab, presence of viral mutation(s) within the areas targeted by this assay, and inadequate number of viral copies (<131 copies/mL). A negative result must be combined with clinical observations, patient history, and epidemiological information. The expected result is Negative.  Fact Sheet for Patients:  https://www.moore.com/  Fact Sheet for Healthcare Providers:   https://www.young.biz/  This test is no t yet approved or cleared by the [place] FDA and  has been authorized for detection and/or diagnosis of SARS-CoV-2 by FDA under an Emergency Use Authorization (EUA). This EUA will remain  in effect (meaning this test can be used) for the duration of the COVID-19 declaration under Section 564(b)(1) of the Act, 21 U.S.C. section 360bbb-3(b)(1), unless the authorization is terminated or revoked sooner.     Influenza A by PCR NEGATIVE NEGATIVE Final   Influenza B by PCR NEGATIVE NEGATIVE Final    Comment: (NOTE) The Xpert Xpress SARS-CoV-2/FLU/RSV assay is intended as an aid in  the diagnosis of influenza from Nasopharyngeal swab specimens and  should not be used as a sole basis for treatment. Nasal washings and  aspirates are unacceptable for Xpert Xpress SARS-CoV-2/FLU/RSV  testing.  Fact Sheet for Patients: https://www.moore.com/  Fact Sheet for Healthcare Providers: https://www.young.biz/  This test is not yet approved or cleared by the [place] FDA and  has been authorized for detection and/or diagnosis of SARS-CoV-2 by  FDA under an Emergency Use Authorization (EUA). This EUA will remain  in effect (meaning this test can be used) for the duration of the  Covid-19 declaration under Section 564(b)(1) of the Act, 21  U.S.C. section 360bbb-3(b)(1), unless the authorization is  terminated or revoked. Performed at [facility], [address] [place], [place] [address]   MRSA PCR Screening     Status: None   Collection Time: 01/10/20 12:58 AM   Specimen: Nasal Mucosa; Nasopharyngeal  Result Value Ref Range Status   MRSA by PCR NEGATIVE NEGATIVE Final    Comment:        The GeneXpert MRSA Assay (FDA approved for NASAL specimens only), is one component of a comprehensive MRSA colonization surveillance program. It is not intended to diagnose MRSA infection nor  to guide or monitor treatment for MRSA infections. Performed at [facility], [address], [place], [place] [address]  C Difficile Quick Screen w PCR reflex     Status: None   Collection Time: 01/12/20  5:02 AM   Specimen: STOOL  Result Value Ref Range Status   C Diff antigen NEGATIVE NEGATIVE Final   C Diff toxin NEGATIVE NEGATIVE Final   C Diff interpretation No C. difficile detected.  Final    Comment: Performed at [facility], [address] [place], [place] [address]  Gastrointestinal Panel by PCR , Stool     Status: None   Collection Time: 01/12/20  5:02 AM   Specimen: STOOL  Result Value Ref Range Status   Campylobacter species NOT DETECTED NOT DETECTED Final   Plesimonas shigelloides NOT DETECTED NOT DETECTED Final   Salmonella species NOT DETECTED NOT DETECTED Final   Yersinia enterocolitica NOT DETECTED NOT DETECTED Final   Vibrio species NOT DETECTED NOT DETECTED Final   Vibrio cholerae NOT DETECTED NOT DETECTED Final   Enteroaggregative E coli (EAEC) NOT DETECTED NOT DETECTED Final   Enteropathogenic E coli (EPEC) NOT DETECTED NOT DETECTED Final   Enterotoxigenic E coli (ETEC) NOT DETECTED NOT DETECTED Final   Shiga like toxin producing E coli (STEC) NOT DETECTED NOT DETECTED Final   Shigella/Enteroinvasive E coli (EIEC) NOT DETECTED NOT DETECTED Final   Cryptosporidium NOT DETECTED NOT DETECTED Final   Cyclospora cayetanensis NOT DETECTED NOT DETECTED Final   Entamoeba histolytica NOT DETECTED NOT DETECTED Final   Giardia lamblia NOT DETECTED NOT DETECTED Final   Adenovirus F40/41 NOT DETECTED NOT DETECTED Final   Astrovirus NOT DETECTED NOT DETECTED Final   Norovirus GI/GII NOT DETECTED NOT DETECTED Final   Rotavirus A NOT DETECTED NOT DETECTED Final   Sapovirus (I, II, IV, and V) NOT DETECTED NOT DETECTED Final    Comment: Performed at [facility], [address] [place], [place] [address]    BNP (last 3 results) Recent Labs     02/21/19 2213 05/16/19 1840 11-15-19 1722  BNP 484.5* 1,338.5* 471.2*        Shadow Schedler S [employer]   [facility] If 7PM-7AM, please contact night-coverage at [email or web address], Office  [phone]   01/13/2020, 12:42 PM  LOS: 3 days

## 2020-01-13 NOTE — Progress Notes (Signed)
Progress Note  Patient Name: Gabrielle [name] Date of Encounter: 01/13/2020  [facility] Cardiologist: Donato Schultz, MD   Subjective   No energy, mild dyspnea. Remains in AFib, rates 100-120 bpm. She does not have permanent AFib, but does have a high burden of paroxysmal atrial fibrillation, based on her PM download from September. Creatinine continue to rise very slowly. Net diuresis only 1/1 l since admission, but weight down 2.5 kg.  Inpatient Medications    Scheduled Meds: . atorvastatin  20 mg Oral QPM  . citalopram  40 mg Oral Daily  . diltiazem  120 mg Oral Daily  . enoxaparin (LOVENOX) injection  40 mg Subcutaneous Q24H  . furosemide  80 mg Intravenous BID  . insulin aspart  0-9 Units Subcutaneous TID WC  . ipratropium-albuterol  3 mL Nebulization BID  . mouth rinse  15 mL Mouth Rinse BID  . melatonin  10 mg Oral QHS  . methylPREDNISolone (SOLU-MEDROL) injection  60 mg Intravenous Q12H  . metoprolol tartrate  100 mg Oral BID  . pantoprazole  40 mg Oral Daily  . sodium chloride flush  3 mL Intravenous Q12H  . sucralfate  1 g Oral TID AC   Continuous Infusions:  PRN Meds: acetaminophen **OR** acetaminophen, ALPRAZolam, docusate sodium   Vital Signs    Vitals:   01/13/20 0347 01/13/20 0716 01/13/20 0829 01/13/20 0910  BP: 122/82 (!) 131/94  133/78  Pulse: 97 94 (!) 114 (!) 115  Resp: 17 (!) 21 20   Temp: 98.3 F (36.8 C) 97.9 F (36.6 C)    TempSrc: Oral Oral    SpO2: 90% 94%    Weight:      Height:        Intake/Output Summary (Last 24 hours) at 01/13/2020 1025 Last data filed at 01/13/2020 0900 Gross per 24 hour  Intake 240 ml  Output 825 ml  Net -585 ml   Last 3 Weights 01/12/2020 01/11/2020 01/10/2020  Weight (lbs) 196 lb 3.4 oz 197 lb 12 oz 201 lb 11.5 oz  Weight (kg) 89 kg 89.7 kg 91.5 kg      Telemetry    AF with RVR - Personally Reviewed  ECG    01/11/2020 ECG shows AFib controlled ventricular rate and about 50% V paced beats  - Personally Reviewed  Physical Exam  Obese. Lying almost fully flat GEN: No acute distress.   Neck: unable to evaluate JVD Cardiac: irregular, no murmurs, rubs, or gallops.  Respiratory: Clear to auscultation bilaterally. GI: Soft, nontender, non-distended  MS: No edema; No deformity. Neuro:  Nonfocal  Psych: Normal affect   Labs    High Sensitivity Troponin:   Recent Labs  Lab 02-06-2020 1722 2020/02/06 2025 01/11/20 0127 01/11/20 1717  TROPONINIHS 11 10 12  18*      Chemistry Recent Labs  Lab 02-06-2020 1722 2020/02/06 2007 01/11/20 0055 01/12/20 0100 01/13/20 0111  NA 137   < > 141 143 140  K 4.9   < > 4.0 3.7 3.5  CL 95*   < > 96* 94* 91*  CO2 34*   < > 35* 35* 39*  GLUCOSE 124*   < > 174* 161* 164*  BUN 16   < > 14 22 36*  CREATININE 1.27*   < > 1.16* 1.38* 1.45*  CALCIUM 9.5   < > 9.8 9.9 9.5  PROT 6.7  --   --   --   --   ALBUMIN 3.7  --   --   --   --  AST 17  --   --   --   --   ALT 7  --   --   --   --   ALKPHOS 46  --   --   --   --   BILITOT 0.7  --   --   --   --   GFRNONAA 44*   < > 49* 40* 37*  ANIONGAP 8   < > 10 14 10    < > = values in this interval not displayed.     Hematology Recent Labs  Lab 01/08/2020 1722 01/28/2020 1722 01/22/2020 2007 01/25/2020 2027 01/10/20 0423  WBC 12.8*  --   --   --  8.4  RBC 4.03  --   --   --  3.93  HGB 10.5*   < > 12.2 12.6 10.3*  HCT 37.6   < > 36.0 37.0 36.4  MCV 93.3  --   --   --  92.6  MCH 26.1  --   --   --  26.2  MCHC 27.9*  --   --   --  28.3*  RDW 15.5  --   --   --  15.5  PLT 210  --   --   --  166   < > = values in this interval not displayed.    BNP Recent Labs  Lab 01/12/2020 1722  BNP 471.2*     DDimer No results for input(s): DDIMER in the last 168 hours.   Radiology    No results found.  Cardiac Studies   Echo 01/10/20 1. Left ventricular ejection fraction, by estimation, is 55 to 60%. The  left ventricle has normal function. The left ventricle has no regional  wall motion  abnormalities. Left ventricular diastolic parameters are  indeterminate.  2. Right ventricular systolic function is normal. The right ventricular  size is normal.  3. Left atrial size was mildly dilated.  4. The mitral valve is normal in structure. Mild mitral valve  regurgitation. No evidence of mitral stenosis. Severe mitral annular  calcification.  5. The aortic valve is normal in structure. Aortic valve regurgitation is  not visualized. No aortic stenosis is present.  6. The inferior vena cava is dilated in size with <50% respiratory  variability, suggesting right atrial pressure of 15 mmHg.    Patient Profile     74 y.o. female admitted with acute on chronic respiratory failure due to acute on chronic diastolic heart failure and COPD on chronic home O2 2l/min, permanent AFib, now with RVR, HTN, CKD stage 3, history of GI bleeding  Assessment & Plan    1. Acute on chronic heart failure with preserved ejection fraction -weight down 2.5 kg. Diuretics on hold due to worsening renal parameters. -no overt hypervolemia by exam -baseline weight based on office visits is 196-200 lb. She weighs 196 lb now -BNP of 471 is not bad for her, but rather her baseline (was 1338 in March, 1220 last year) -I do not think she needs more diuresis  2.  Persistent atrial fibrillation -history of sick sinus syndrome status post pacemaker -she does not have permanent atrial fibrillation. Pacemaker download in September showed burden of AF to be 27%, with mediocre rate control -not on anticoagulation due to history of GI bleed, so cardioversion does not appear to currently be an option, but may have to consider this in the near future if she remains symptomatic. Would start anticoagulation first and make sure this  is tolerated before DCCV. -increase diltiazem dose -will interrogate her pacemaker to se if current AFib episode appears to be the cause versus consequence of her decompensation.  3.  CAD  status post CABG -Denies angina.  Troponins are negative.  Echo shows preserved EF with no regional wall motion normalities. -On statin, not on aspirin due to GI bleed.  4. Chronic resp failure/hypoxia due to COPD: - on O2 2l/min at home.     For questions or updates, please contact [facility] Please consult [email or web address] for contact info under        Signed, Thurmon Fair, MD  01/13/2020, 10:25 AM

## 2020-01-14 DIAGNOSIS — I2510 Atherosclerotic heart disease of native coronary artery without angina pectoris: Secondary | ICD-10-CM | POA: Diagnosis not present

## 2020-01-14 DIAGNOSIS — J441 Chronic obstructive pulmonary disease with (acute) exacerbation: Secondary | ICD-10-CM | POA: Diagnosis not present

## 2020-01-14 DIAGNOSIS — I495 Sick sinus syndrome: Secondary | ICD-10-CM | POA: Diagnosis not present

## 2020-01-14 DIAGNOSIS — J9621 Acute and chronic respiratory failure with hypoxia: Secondary | ICD-10-CM | POA: Diagnosis not present

## 2020-01-14 DIAGNOSIS — I2583 Coronary atherosclerosis due to lipid rich plaque: Secondary | ICD-10-CM

## 2020-01-14 DIAGNOSIS — I4819 Other persistent atrial fibrillation: Secondary | ICD-10-CM | POA: Diagnosis not present

## 2020-01-14 LAB — GLUCOSE, CAPILLARY
Glucose-Capillary: 240 mg/dL — ABNORMAL HIGH (ref 70–99)
Glucose-Capillary: 221 mg/dL — ABNORMAL HIGH (ref 70–99)
Glucose-Capillary: 287 mg/dL — ABNORMAL HIGH (ref 70–99)
Glucose-Capillary: 141 mg/dL — ABNORMAL HIGH (ref 70–99)

## 2020-01-14 LAB — BASIC METABOLIC PANEL
Anion gap: 11 (ref 5–15)
BUN: 43 mg/dL — ABNORMAL HIGH (ref 8–23)
CO2: 35 mmol/L — ABNORMAL HIGH (ref 22–32)
Calcium: 9.6 mg/dL (ref 8.9–10.3)
Chloride: 93 mmol/L — ABNORMAL LOW (ref 98–111)
Creatinine, Ser: 1.44 mg/dL — ABNORMAL HIGH (ref 0.44–1.00)
GFR, Estimated: 38 mL/min — ABNORMAL LOW (ref >60)
Glucose, Bld: 185 mg/dL — ABNORMAL HIGH (ref 70–99)
Potassium: 3.9 mmol/L (ref 3.5–5.1)
Sodium: 139 mmol/L (ref 135–145)

## 2020-01-14 MED ORDER — SALINE SPRAY 0.65 % NA SOLN
1.0000 | NASAL | Status: DC | PRN
  Filled 2020-01-14: qty 44

## 2020-01-14 NOTE — Plan of Care (Signed)
  Problem: Clinical Measurements: Goal: Ability to maintain clinical measurements within normal limits will improve Outcome: Progressing   

## 2020-01-14 NOTE — Progress Notes (Signed)
Progress Note  Patient Name: Gabrielle [name] Date of Encounter: 01/14/2020  [facility] Cardiologist: Donato Schultz, MD   Subjective   Still feels weak, dry cough, on fairly high O2 requirements.  Inpatient Medications    Scheduled Meds: . atorvastatin  20 mg Oral QPM  . citalopram  40 mg Oral Daily  . diltiazem  240 mg Oral Daily  . enoxaparin (LOVENOX) injection  40 mg Subcutaneous Q24H  . insulin aspart  0-9 Units Subcutaneous TID WC  . ipratropium-albuterol  3 mL Nebulization BID  . mouth rinse  15 mL Mouth Rinse BID  . melatonin  10 mg Oral QHS  . methylPREDNISolone (SOLU-MEDROL) injection  60 mg Intravenous Q12H  . metoprolol tartrate  100 mg Oral BID  . pantoprazole  40 mg Oral Daily  . sodium chloride flush  3 mL Intravenous Q12H  . sucralfate  1 g Oral TID AC   Continuous Infusions:  PRN Meds: acetaminophen **OR** acetaminophen, ALPRAZolam, docusate sodium   Vital Signs    Vitals:   01/13/20 2316 01/14/20 0311 01/14/20 0802 01/14/20 0838  BP: (!) 143/93 (!) 110/92 114/86   Pulse: 92 85 (!) 104   Resp: 20 19 (!) 21   Temp: 98 F (36.7 C) 98.3 F (36.8 C) 97.8 F (36.6 C)   TempSrc: Oral Oral Oral   SpO2: 100% 91% 90% 93%  Weight:  90.6 kg    Height:        Intake/Output Summary (Last 24 hours) at 01/14/2020 1030 Last data filed at 01/14/2020 0314 Gross per 24 hour  Intake 120 ml  Output 200 ml  Net -80 ml   Last 3 Weights 01/14/2020 01/12/2020 01/11/2020  Weight (lbs) 199 lb 11.8 oz 196 lb 3.4 oz 197 lb 12 oz  Weight (kg) 90.6 kg 89 kg 89.7 kg      Telemetry    AFib, well rate controlled - Personally Reviewed  ECG    No new tracing - Personally Reviewed  Physical Exam  Appears chronically ill GEN: No acute distress.   Neck: No JVD Cardiac: irregular, no murmurs, rubs, or gallops.  Respiratory: Clear to auscultation bilaterally. GI: Soft, nontender, non-distended  MS: No edema; No deformity. Neuro:  Nonfocal  Psych: Normal  affect   Labs    High Sensitivity Troponin:   Recent Labs  Lab 01/23/2020 1722 01/10/2020 2025 01/11/20 0127 01/11/20 1717  TROPONINIHS 11 10 12  18*      Chemistry Recent Labs  Lab 01/03/2020 1722 01/08/2020 2007 01/12/20 0100 01/13/20 0111 01/14/20 0040  NA 137   < > 143 140 139  K 4.9   < > 3.7 3.5 3.9  CL 95*   < > 94* 91* 93*  CO2 34*   < > 35* 39* 35*  GLUCOSE 124*   < > 161* 164* 185*  BUN 16   < > 22 36* 43*  CREATININE 1.27*   < > 1.38* 1.45* 1.44*  CALCIUM 9.5   < > 9.9 9.5 9.6  PROT 6.7  --   --   --   --   ALBUMIN 3.7  --   --   --   --   AST 17  --   --   --   --   ALT 7  --   --   --   --   ALKPHOS 46  --   --   --   --   BILITOT 0.7  --   --   --   --  GFRNONAA 44*   < > 40* 37* 38*  ANIONGAP 8   < > 14 10 11    < > = values in this interval not displayed.     Hematology Recent Labs  Lab 01/12/2020 1722 01-12-2020 1722 01/12/2020 2007 01/12/2020 2027 01/10/20 0423  WBC 12.8*  --   --   --  8.4  RBC 4.03  --   --   --  3.93  HGB 10.5*   < > 12.2 12.6 10.3*  HCT 37.6   < > 36.0 37.0 36.4  MCV 93.3  --   --   --  92.6  MCH 26.1  --   --   --  26.2  MCHC 27.9*  --   --   --  28.3*  RDW 15.5  --   --   --  15.5  PLT 210  --   --   --  166   < > = values in this interval not displayed.    BNP Recent Labs  Lab 2020-01-12 1722  BNP 471.2*     DDimer No results for input(s): DDIMER in the last 168 hours.   Radiology    No results found.  Cardiac Studies   Echo 01/10/20 1. Left ventricular ejection fraction, by estimation, is 55 to 60%. The  left ventricle has normal function. The left ventricle has no regional  wall motion abnormalities. Left ventricular diastolic parameters are  indeterminate.  2. Right ventricular systolic function is normal. The right ventricular  size is normal.  3. Left atrial size was mildly dilated.  4. The mitral valve is normal in structure. Mild mitral valve  regurgitation. No evidence of mitral stenosis. Severe  mitral annular  calcification.  5. The aortic valve is normal in structure. Aortic valve regurgitation is  not visualized. No aortic stenosis is present.  6. The inferior vena cava is dilated in size with <50% respiratory  variability, suggesting right atrial pressure of 15 mmHg.   Pacemaker download 01/14/2020: Normal device function. In AFib persistently since November 21, 2019 (overall burden 41% in last 12 months). Occasional atrial undersensing of fib waves. Throughout most of this period, ventricular rate control has been fair, except for last 1-2 weeks. RV pacing only 14%.  Patient Profile     74 y.o. female with acute on chronic respiratory failure, partly due to acute on chronic diastolic heart failure and COPD on chronic home O2 2l/min, permanent AFib, now with RVR, HTN, CKD stage 3, history of GI bleeding  Assessment & Plan    1. Acute on chronic heart failure with preserved ejection fraction -weight down 2.5 kg. Diuretics on hold due to worsening renal parameters. -no overt hypervolemia by exam  - in/out balanced. Reported weight increase since yesterday. -baseline weight based on office visits is 196-200 lb. She weighs 199 lb now -BNP of 471 is not bad for her, but rather her baseline (was 1338 in March, 1220 last year) -I do not think she needs more diuresis. Creatinine has stopped increasing. Use diuretics prn for weight >200 lb.  2. Persistent atrial fibrillation -history of sick sinus syndrome status post pacemaker -she does not have permanent atrial fibrillation. Pacemaker download shows that she has had uninterrupted persistent atrial fibrillation since September, for the most part well rate controlled until last several days before admission. -not on anticoagulation due to history of GI bleed, so cardioversion does not appear to currently be an option, but may have to consider this  in the near future if she remains symptomatic. Would start anticoagulation first  and make sure this is tolerated before DCCV. -increased diltiazem dose and rate control is now optimal - doubt the current atrial fibrillation episode caused her decompensation.  3. CAD status post CABG -Denies angina. Troponins are negative. Echo shows preserved EF with no regional wall motion normalities. -On statin, not on aspirin due to GI bleed.  4. Chronic resp failure/hypoxia due to COPD: - on O2 2l/min at home prior to admission. On steroids. Had hi-res CT of lungs last night, but the report is not yet available.     For questions or updates, please contact [facility] Please consult [email or web address] for contact info under        Signed, Thurmon Fair, MD  01/14/2020, 10:30 AM

## 2020-01-14 NOTE — Progress Notes (Signed)
[facility]  PROGRESS NOTE  Gabrielle PalauVirginia W [name] BJY:[id number] DOB: 09/20/1945 DOA: 01/30/2020 PCP: Henrine Screwshacker, Robert, MD   Brief HPI:   74 year old female with history of COPD, HFpEF, atrial fibrillation, anemia, QT prolongation, CAD s/p MI and CABG x7, hypertension, tachybradycardia syndrome status post pacemaker presented with progressive dyspnea patient usually is on 2 L of oxygen at home for COPD.  And was requiring 5 L of oxygen.  ABG showed PCO2 of 71, pH 7.32. BNP was elevated at 471, patient started on IV Lasix.   Subjective   Patient seen and examined, still requiring 6 to 8 L oxygen via nasal cannula.  CT chest high resolution obtained yesterday, result is currently pending.  Patient states she did not sleep well last night due to shoulder pain.   Assessment/Plan:     1. Acute hypercapnic respiratory failure-patient has underlying COPD and is on 2 L of oxygen at home chronically.  She presented with COPD exacerbation, received Solu-Medrol in the ED. started on DuoNeb nebulizer every 6 hours, Solu-Medrol 60 mg IV every 6 hours.  Solu-Medrol was switched to 60 mg IV every 12 hours.  Continue to wean off oxygen as tolerated.  CT chest high-resolution was obtained yesterday to look for underlying pulmonary fibrosis.  Result is currently pending.  Patient has no significant improved with above treatment, will consult pulmonology for further recommendations. 2. Diarrhea-patient had diarrhea, which is resolved.  Stool for C. difficile PCR was negative.  GI pathogen panel obtained was also negative. 3. ? Acute on chronic diastolic CHF-echocardiogram from 2020 showed grade 2 diastolic dysfunction, EF 50 to 55%.  Patient was started on Lasix 40 mg IV every 12 hours for 1 day and then transition to Lasix 40 mg daily.  She appears volume overloaded yesterday so she was started back on Lasix 40 mg IV every 12 hours, however she had very minimal urine output.  Serum creatinine is rising.  I doubt  that patient has acute diastolic CHF.  Echocardiogram done on 01/10/2020 again showed indeterminate diastolic dysfunction, EF is 55 to 60%.  Cardiology was consulted, and agree that patient does not appear to be in acute diastolic CHF.  Lasix has been discontinued. 4. Acute kidney injury on CKD stage III-patient baseline creatinine is around 1.2-1.3.  She came with creatinine 1.15, which has now worsened to 1.45 with diuresis, BUN is up to 43.  Lasix was  discontinued.  Cardiology saw the patient and recommended that she does not need any more diuresis.  Follow BMP in am. 5. Chronic atrial fibrillation-patient is not on anticoagulantion due to history of GI bleed, continue diltiazem 120 mg daily, metoprolol dose has been changed to 100 mg p.o. twice daily.  Heart rate is controlled. 6. Hypertension-continue metoprolol, diltiazem. 7. Hyperglycemia due to steroids -patient's hemoglobin A1c was 6.4 in March 2021.  She is currently on Solu-Medrol, will start sliding scale insulin NovoLog.  Hemoglobin A1c as of 01/10/2020 is 6.0. 8. Tachybradycardia syndrome s/p pacemaker placement-patient has pacemaker in place, she is having episodes of bradycardia.  Cardiology was consulted, EP reviewed EKG and telemetry strips.  Pacemaker is functioning fine as per cardiology.     COVID-19 Labs  No results for input(s): DDIMER, FERRITIN, LDH, CRP in the last 72 hours.  Lab Results  Component Value Date   SARSCOV2NAA NEGATIVE 01/13/2020   SARSCOV2NAA NEGATIVE 05/16/2019   SARSCOV2NAA NEGATIVE 02/21/2019   SARSCOV2NAA Not Detected 02/08/2019     Scheduled medications:   . atorvastatin  20 mg Oral QPM  . citalopram  40 mg Oral Daily  . diltiazem  240 mg Oral Daily  . enoxaparin (LOVENOX) injection  40 mg Subcutaneous Q24H  . insulin aspart  0-9 Units Subcutaneous TID WC  . ipratropium-albuterol  3 mL Nebulization BID  . mouth rinse  15 mL Mouth Rinse BID  . melatonin  10 mg Oral QHS  . methylPREDNISolone  (SOLU-MEDROL) injection  60 mg Intravenous Q12H  . metoprolol tartrate  100 mg Oral BID  . pantoprazole  40 mg Oral Daily  . sodium chloride flush  3 mL Intravenous Q12H  . sucralfate  1 g Oral TID AC         CBG: Recent Labs  Lab 01/13/20 0558 01/13/20 1128 01/13/20 1616 01/13/20 2043 01/14/20 0603  GLUCAP 176* 168* 264* 135* 240*    SpO2: 90 % O2 Flow Rate (L/min): 5 L/min FiO2 (%): 40 %    CBC: Recent Labs  Lab 01-15-2020 1722 01-15-2020 2007 Jan 15, 2020 2027 01/10/20 0423  WBC 12.8*  --   --  8.4  NEUTROABS 9.3*  --   --   --   HGB 10.5* 12.2 12.6 10.3*  HCT 37.6 36.0 37.0 36.4  MCV 93.3  --   --  92.6  PLT 210  --   --  166    Basic Metabolic Panel: Recent Labs  Lab 01/10/20 0423 01/11/20 0055 01/12/20 0100 01/13/20 0111 01/14/20 0040  NA 139 141 143 140 139  K 4.8 4.0 3.7 3.5 3.9  CL 95* 96* 94* 91* 93*  CO2 34* 35* 35* 39* 35*  GLUCOSE 171* 174* 161* 164* 185*  BUN 14 14 22  36* 43*  CREATININE 1.15* 1.16* 1.38* 1.45* 1.44*  CALCIUM 9.6 9.8 9.9 9.5 9.6     Liver Function Tests: Recent Labs  Lab January 15, 2020 1722  AST 17  ALT 7  ALKPHOS 46  BILITOT 0.7  PROT 6.7  ALBUMIN 3.7     Antibiotics: Anti-infectives (From admission, onward)   None       DVT prophylaxis: Lovenox  Code Status: Full code  Family Communication: No family at bedside   Consultants:  Cardiology  Pulmonology  Procedures:      Objective   Vitals:   01/13/20 1956 01/13/20 2316 01/14/20 0311 01/14/20 0802  BP:  (!) 143/93 (!) 110/92 114/86  Pulse:  92 85 (!) 104  Resp:  20 19 (!) 21  Temp:  98 F (36.7 C) 98.3 F (36.8 C) 97.8 F (36.6 C)  TempSrc:  Oral Oral Oral  SpO2: 93% 100% 91% 90%  Weight:   90.6 kg   Height:        Intake/Output Summary (Last 24 hours) at 01/14/2020 0825 Last data filed at 01/14/2020 0314 Gross per 24 hour  Intake 360 ml  Output 400 ml  Net -40 ml    11/12 1901 - 11/14 0700 In: 360 [P.O.:360] Out: 1025  [Urine:1025]  Filed Weights   01/11/20 0421 01/12/20 0412 01/14/20 0311  Weight: 89.7 kg 89 kg 90.6 kg    Physical Examination:  General-appears in no acute distress Heart-S1-S2, regular, no murmur auscultated Lungs-decreased breath sounds at lung bases Abdomen-soft, nontender, no organomegaly Extremities-trace edema in the lower extremities Neuro-alert, oriented x3, no focal deficit noted  Status is: Inpatient  Dispo: The patient is from: Home              Anticipated d/c is to: Home  Anticipated d/c date is: 01/15/2020              Patient currently not medically stable for discharge  Barrier to discharge-ongoing treatment for acute hypercapnic respiratory failure       Data Reviewed:   Recent Results (from the past 240 hour(s))  Respiratory Panel by RT PCR (Flu A&B, Covid) - Nasopharyngeal Swab     Status: None   Collection Time: 2020-01-17  9:12 PM   Specimen: Nasopharyngeal Swab  Result Value Ref Range Status   SARS Coronavirus 2 by RT PCR NEGATIVE NEGATIVE Final    Comment: (NOTE) SARS-CoV-2 target nucleic acids are NOT DETECTED.  The SARS-CoV-2 RNA is generally detectable in upper respiratoy specimens during the acute phase of infection. The lowest concentration of SARS-CoV-2 viral copies this assay can detect is 131 copies/mL. A negative result does not preclude SARS-Cov-2 infection and should not be used as the sole basis for treatment or other patient management decisions. A negative result may occur with  improper specimen collection/handling, submission of specimen other than nasopharyngeal swab, presence of viral mutation(s) within the areas targeted by this assay, and inadequate number of viral copies (<131 copies/mL). A negative result must be combined with clinical observations, patient history, and epidemiological information. The expected result is Negative.  Fact Sheet for Patients:  https://www.moore.com/  Fact  Sheet for Healthcare Providers:  https://www.young.biz/  This test is no t yet approved or cleared by the [place] FDA and  has been authorized for detection and/or diagnosis of SARS-CoV-2 by FDA under an Emergency Use Authorization (EUA). This EUA will remain  in effect (meaning this test can be used) for the duration of the COVID-19 declaration under Section 564(b)(1) of the Act, 21 U.S.C. section 360bbb-3(b)(1), unless the authorization is terminated or revoked sooner.     Influenza A by PCR NEGATIVE NEGATIVE Final   Influenza B by PCR NEGATIVE NEGATIVE Final    Comment: (NOTE) The Xpert Xpress SARS-CoV-2/FLU/RSV assay is intended as an aid in  the diagnosis of influenza from Nasopharyngeal swab specimens and  should not be used as a sole basis for treatment. Nasal washings and  aspirates are unacceptable for Xpert Xpress SARS-CoV-2/FLU/RSV  testing.  Fact Sheet for Patients: https://www.moore.com/  Fact Sheet for Healthcare Providers: https://www.young.biz/  This test is not yet approved or cleared by the [place] FDA and  has been authorized for detection and/or diagnosis of SARS-CoV-2 by  FDA under an Emergency Use Authorization (EUA). This EUA will remain  in effect (meaning this test can be used) for the duration of the  Covid-19 declaration under Section 564(b)(1) of the Act, 21  U.S.C. section 360bbb-3(b)(1), unless the authorization is  terminated or revoked. Performed at [facility], [address] [place], [place] [address]   MRSA PCR Screening     Status: None   Collection Time: 01/10/20 12:58 AM   Specimen: Nasal Mucosa; Nasopharyngeal  Result Value Ref Range Status   MRSA by PCR NEGATIVE NEGATIVE Final    Comment:        The GeneXpert MRSA Assay (FDA approved for NASAL specimens only), is one component of a comprehensive MRSA colonization surveillance program. It is not intended  to diagnose MRSA infection nor to guide or monitor treatment for MRSA infections. Performed at [facility], [address], [place], [place] [address]   C Difficile Quick Screen w PCR reflex     Status: None   Collection Time: 01/12/20  5:02 AM   Specimen: STOOL  Result Value Ref Range Status   C Diff antigen NEGATIVE NEGATIVE Final   C Diff toxin NEGATIVE NEGATIVE Final   C Diff interpretation No C. difficile detected.  Final    Comment: Performed at [facility], [address], [place], [place] [address]  Gastrointestinal Panel by PCR , Stool     Status: None   Collection Time: 01/12/20  5:02 AM   Specimen: STOOL  Result Value Ref Range Status   Campylobacter species NOT DETECTED NOT DETECTED Final   Plesimonas shigelloides NOT DETECTED NOT DETECTED Final   Salmonella species NOT DETECTED NOT DETECTED Final   Yersinia enterocolitica NOT DETECTED NOT DETECTED Final   Vibrio species NOT DETECTED NOT DETECTED Final   Vibrio cholerae NOT DETECTED NOT DETECTED Final   Enteroaggregative E coli (EAEC) NOT DETECTED NOT DETECTED Final   Enteropathogenic E coli (EPEC) NOT DETECTED NOT DETECTED Final   Enterotoxigenic E coli (ETEC) NOT DETECTED NOT DETECTED Final   Shiga like toxin producing E coli (STEC) NOT DETECTED NOT DETECTED Final   Shigella/Enteroinvasive E coli (EIEC) NOT DETECTED NOT DETECTED Final   Cryptosporidium NOT DETECTED NOT DETECTED Final   Cyclospora cayetanensis NOT DETECTED NOT DETECTED Final   Entamoeba histolytica NOT DETECTED NOT DETECTED Final   Giardia lamblia NOT DETECTED NOT DETECTED Final   Adenovirus F40/41 NOT DETECTED NOT DETECTED Final   Astrovirus NOT DETECTED NOT DETECTED Final   Norovirus GI/GII NOT DETECTED NOT DETECTED Final   Rotavirus A NOT DETECTED NOT DETECTED Final   Sapovirus (I, II, IV, and V) NOT DETECTED NOT DETECTED Final    Comment: Performed at [facility], [address] [place], [place] [address]    BNP (last  3 results) Recent Labs    02/21/19 2213 05/16/19 1840 01/12/2020 1722  BNP 484.5* 1,338.5* 471.2*      Rylynn Kobs S Azlee Monforte   [facility] If 7PM-7AM, please contact night-coverage at [email or web address], Office  [phone]   01/14/2020, 8:25 AM  LOS: 4 days

## 2020-01-15 ENCOUNTER — Inpatient Hospital Stay (HOSPITAL_COMMUNITY): Payer: Medicare Other

## 2020-01-15 DIAGNOSIS — R0689 Other abnormalities of breathing: Secondary | ICD-10-CM | POA: Diagnosis not present

## 2020-01-15 DIAGNOSIS — J9621 Acute and chronic respiratory failure with hypoxia: Secondary | ICD-10-CM | POA: Diagnosis not present

## 2020-01-15 DIAGNOSIS — J9601 Acute respiratory failure with hypoxia: Secondary | ICD-10-CM

## 2020-01-15 DIAGNOSIS — I5033 Acute on chronic diastolic (congestive) heart failure: Secondary | ICD-10-CM | POA: Diagnosis not present

## 2020-01-15 LAB — BASIC METABOLIC PANEL
Anion gap: 13 (ref 5–15)
BUN: 42 mg/dL — ABNORMAL HIGH (ref 8–23)
CO2: 36 mmol/L — ABNORMAL HIGH (ref 22–32)
Calcium: 9.7 mg/dL (ref 8.9–10.3)
Chloride: 91 mmol/L — ABNORMAL LOW (ref 98–111)
Creatinine, Ser: 1.28 mg/dL — ABNORMAL HIGH (ref 0.44–1.00)
GFR, Estimated: 43 mL/min — ABNORMAL LOW (ref >60)
Glucose, Bld: 176 mg/dL — ABNORMAL HIGH (ref 70–99)
Potassium: 3.5 mmol/L (ref 3.5–5.1)
Sodium: 140 mmol/L (ref 135–145)

## 2020-01-15 LAB — GLUCOSE, CAPILLARY
Glucose-Capillary: 279 mg/dL — ABNORMAL HIGH (ref 70–99)
Glucose-Capillary: 184 mg/dL — ABNORMAL HIGH (ref 70–99)
Glucose-Capillary: 189 mg/dL — ABNORMAL HIGH (ref 70–99)
Glucose-Capillary: 186 mg/dL — ABNORMAL HIGH (ref 70–99)

## 2020-01-15 LAB — BLOOD GAS, ARTERIAL
Acid-Base Excess: 15.2 mmol/L — ABNORMAL HIGH (ref 0.0–2.0)
Bicarbonate: 41.1 mmol/L — ABNORMAL HIGH (ref 20.0–28.0)
Drawn by: 270271
FIO2: 70
O2 Saturation: 85.8 %
Patient temperature: 37
pCO2 arterial: 72.4 mmHg (ref 32.0–48.0)
pH, Arterial: 7.373 (ref 7.350–7.450)
pO2, Arterial: 53.3 mmHg — ABNORMAL LOW (ref 83.0–108.0)

## 2020-01-15 MED ORDER — IPRATROPIUM-ALBUTEROL 0.5-2.5 (3) MG/3ML IN SOLN
3.0000 mL | Freq: Four times a day (QID) | RESPIRATORY_TRACT | Status: DC
  Administered 2020-01-15 – 2020-01-16 (×3): 3 mL via RESPIRATORY_TRACT
  Filled 2020-01-15 (×4): qty 3

## 2020-01-15 MED ORDER — BUDESONIDE 0.25 MG/2ML IN SUSP
0.2500 mg | Freq: Two times a day (BID) | RESPIRATORY_TRACT | Status: DC
  Administered 2020-01-15 – 2020-01-18 (×6): 0.25 mg via RESPIRATORY_TRACT
  Filled 2020-01-15 (×7): qty 2

## 2020-01-15 MED ORDER — FUROSEMIDE 10 MG/ML IJ SOLN
40.0000 mg | Freq: Once | INTRAMUSCULAR | Status: AC
  Administered 2020-01-15: 40 mg via INTRAVENOUS
  Filled 2020-01-15: qty 4

## 2020-01-15 MED ORDER — FUROSEMIDE 20 MG PO TABS
20.0000 mg | ORAL_TABLET | Freq: Every day | ORAL | Status: DC
  Administered 2020-01-16: 20 mg via ORAL
  Filled 2020-01-15: qty 1

## 2020-01-15 MED ORDER — BUDESONIDE 0.25 MG/2ML IN SUSP: 0.2500 mg | Freq: Two times a day (BID) | RESPIRATORY_TRACT | Status: DC

## 2020-01-15 MED ORDER — POTASSIUM CHLORIDE CRYS ER 20 MEQ PO TBCR
40.0000 meq | EXTENDED_RELEASE_TABLET | Freq: Once | ORAL | Status: AC
  Administered 2020-01-15: 40 meq via ORAL
  Filled 2020-01-15: qty 2

## 2020-01-15 NOTE — Progress Notes (Signed)
Pt was placed on BIPAP around 2211 due to desat and not as responsive as earlier in the day. ABG was obtained per RT protocol showed increase in C02.  Patient has removed BIPAP at this time and back on high flow salter and states she can not wear the BIPAP.

## 2020-01-15 NOTE — Plan of Care (Signed)
  Problem: Education: Goal: Knowledge of General Education information will improve Description: Including pain rating scale, medication(s)/side effects and non-pharmacologic comfort measures Outcome: Progressing   Problem: Health Behavior/Discharge Planning: Goal: Ability to manage health-related needs will improve Outcome: Progressing   Problem: Clinical Measurements: Goal: Ability to maintain clinical measurements within normal limits will improve Outcome: Not Progressing Goal: Will remain free from infection Outcome: Progressing Goal: Diagnostic test results will improve Outcome: Progressing Goal: Respiratory complications will improve Outcome: Not Progressing Goal: Cardiovascular complication will be avoided Outcome: Progressing   Problem: Activity: Goal: Risk for activity intolerance will decrease Outcome: Not Progressing   Problem: Nutrition: Goal: Adequate nutrition will be maintained Outcome: Progressing   Problem: Coping: Goal: Level of anxiety will decrease Outcome: Not Progressing   Problem: Elimination: Goal: Will not experience complications related to bowel motility Outcome: Progressing Goal: Will not experience complications related to urinary retention Outcome: Progressing   Problem: Pain Managment: Goal: General experience of comfort will improve Outcome: Progressing   Problem: Safety: Goal: Ability to remain free from injury will improve Outcome: Progressing   Problem: Skin Integrity: Goal: Risk for impaired skin integrity will decrease Outcome: Progressing

## 2020-01-15 NOTE — Progress Notes (Signed)
Progress Note  Patient Name: Gabrielle [name] Date of Encounter: 01/15/2020  [facility] Cardiologist: Donato Schultz, MD   Subjective  Patient complaining of abdominal and back pain. Thinks she needs to have a BM. O2 requirement increased this AM to 12L. Received neb treatment. Wt from 196-->199  Remains in rate controlled Afib On 6L [place] with SpO2 87-92-->bumped to 12L later this AM Net negative 497 off diuretics; Wt increased to 199lbs Inpatient Medications    Scheduled Meds: . atorvastatin  20 mg Oral QPM  . citalopram  40 mg Oral Daily  . diltiazem  240 mg Oral Daily  . enoxaparin (LOVENOX) injection  40 mg Subcutaneous Q24H  . insulin aspart  0-9 Units Subcutaneous TID WC  . ipratropium-albuterol  3 mL Nebulization BID  . mouth rinse  15 mL Mouth Rinse BID  . melatonin  10 mg Oral QHS  . methylPREDNISolone (SOLU-MEDROL) injection  60 mg Intravenous Q12H  . metoprolol tartrate  100 mg Oral BID  . pantoprazole  40 mg Oral Daily  . sodium chloride flush  3 mL Intravenous Q12H  . sucralfate  1 g Oral TID AC   Continuous Infusions:  PRN Meds: acetaminophen **OR** acetaminophen, ALPRAZolam, docusate sodium, sodium chloride   Vital Signs    Vitals:   01/14/20 2016 01/14/20 2336 01/15/20 0326 01/15/20 0745  BP: (!) 133/99 114/69 (!) 143/95 (!) 131/92  Pulse: 78 83 74 86  Resp: 19 15 17  (!) 25  Temp: 97.6 F (36.4 C) 97.8 F (36.6 C) 98 F (36.7 C) 97.6 F (36.4 C)  TempSrc: Oral Oral Oral Oral  SpO2: 93% 90% 90% 91%  Weight:      Height:        Intake/Output Summary (Last 24 hours) at 01/15/2020 01/17/2020 Last data filed at 01/15/2020 0800 Gross per 24 hour  Intake 3 ml  Output 1100 ml  Net -1097 ml   Last 3 Weights 01/14/2020 01/12/2020 01/11/2020  Weight (lbs) 199 lb 11.8 oz 196 lb 3.4 oz 197 lb 12 oz  Weight (kg) 90.6 kg 89 kg 89.7 kg      Telemetry    Rate controlled Afib; intermittent V-pacing - Personally Reviewed  ECG    No new tracing -  Personally Reviewed  Physical Exam   GEN: Chronically ill-appearing, comfortable in bed  Neck: JVD difficult to assess due to body habitus Cardiac: Irregular, no murmus Respiratory: Faint expiratory wheezing at right lung base. Fair air movement GI: Obese, soft, ND MS: No edema; No deformity. Neuro:  Nonfocal  Psych: Normal affect   Labs    High Sensitivity Troponin:   Recent Labs  Lab January 26, 2020 1722 01-26-2020 2025 01/11/20 0127 01/11/20 1717  TROPONINIHS 11 10 12  18*      Chemistry Recent Labs  Lab 2020-01-26 1722 26-Jan-2020 2007 01/13/20 0111 01/14/20 0040 01/15/20 0240  NA 137   < > 140 139 140  K 4.9   < > 3.5 3.9 3.5  CL 95*   < > 91* 93* 91*  CO2 34*   < > 39* 35* 36*  GLUCOSE 124*   < > 164* 185* 176*  BUN 16   < > 36* 43* 42*  CREATININE 1.27*   < > 1.45* 1.44* 1.28*  CALCIUM 9.5   < > 9.5 9.6 9.7  PROT 6.7  --   --   --   --   ALBUMIN 3.7  --   --   --   --  AST 17  --   --   --   --   ALT 7  --   --   --   --   ALKPHOS 46  --   --   --   --   BILITOT 0.7  --   --   --   --   GFRNONAA 44*   < > 37* 38* 43*  ANIONGAP 8   < > 10 11 13    < > = values in this interval not displayed.     Hematology Recent Labs  Lab 01/10/2020 1722 01/02/2020 1722 01/05/2020 2007 01/04/2020 2027 01/10/20 0423  WBC 12.8*  --   --   --  8.4  RBC 4.03  --   --   --  3.93  HGB 10.5*   < > 12.2 12.6 10.3*  HCT 37.6   < > 36.0 37.0 36.4  MCV 93.3  --   --   --  92.6  MCH 26.1  --   --   --  26.2  MCHC 27.9*  --   --   --  28.3*  RDW 15.5  --   --   --  15.5  PLT 210  --   --   --  166   < > = values in this interval not displayed.    BNP Recent Labs  Lab 01/27/2020 1722  BNP 471.2*     DDimer No results for input(s): DDIMER in the last 168 hours.   Radiology    CT Chest High Resolution  Result Date: 01/15/2020 CLINICAL DATA:  74 year old female with history of respiratory failure. Evaluate for interstitial lung disease. EXAM: CT CHEST WITHOUT CONTRAST TECHNIQUE:  Multidetector CT imaging of the chest was performed following the standard protocol without intravenous contrast. High resolution imaging of the lungs, as well as inspiratory and expiratory imaging, was performed. COMPARISON:  Chest CT 05/23/2019. FINDINGS: Cardiovascular: Heart size is mildly enlarged. There is no significant pericardial fluid, thickening or pericardial calcification. There is aortic atherosclerosis, as well as atherosclerosis of the great vessels of the mediastinum and the coronary arteries, including calcified atherosclerotic plaque in the left main, left anterior descending, left circumflex and right coronary arteries. Status post median sternotomy for CABG including LIMA to the LAD. Left-sided pacemaker in place with lead tips terminating in the right atrial appendage and right ventricular apex. Moderate to severe calcifications of the mitral annulus. Dilatation of the pulmonic trunk (3.9 cm in diameter). Mediastinum/Nodes: No pathologically enlarged mediastinal or hilar lymph nodes. Please note that accurate exclusion of hilar adenopathy is limited on noncontrast CT scans. Esophagus is unremarkable in appearance. No axillary lymphadenopathy. Lungs/Pleura: Several areas of chronic scarring and volume loss are noted in the lungs bilaterally, as well as the anterior aspects of the upper lobes of the lungs bilaterally, very similar to prior study from 05/23/2019. No generalized areas of ground-glass attenuation, septal thickening, subpleural reticulation, traction bronchiectasis or honeycombing are noted to clearly indicate underlying interstitial lung disease. Inspiratory and expiratory imaging is unremarkable. Trace right and small left pleural effusions are noted lying dependently. No definite suspicious appearing pulmonary nodules or masses are noted. Upper Abdomen: Aortic atherosclerosis. Trace amount of ascites noted adjacent to the liver. Visualized portions of the liver have a nodular  appearance and shrunken contour, suggesting underlying cirrhosis. Musculoskeletal: There are no aggressive appearing lytic or blastic lesions noted in the visualized portions of the skeleton. Median sternotomy wires. Chronic compression fractures of T9,  T12 and superior endplate of L3, most severe at T12 where there is 90% loss of central vertebral body height, similar to the prior examination. IMPRESSION: 1. While there are several areas of mild chronic scarring scattered throughout the lungs bilaterally, this is presumably related to prior infection. No definitive imaging findings to suggest interstitial lung disease at this time. 2. Trace right and small left pleural effusions lying dependently. 3. Cardiomegaly. 4. Dilatation of the pulmonic trunk (3.9 cm in diameter), concerning for pulmonary arterial hypertension. 5. Aortic atherosclerosis, in addition to left main and 3 vessel coronary artery disease. Status post median sternotomy for CABG including LIMA to the LAD. 6. Morphologic changes in the liver indicative of underlying cirrhosis. Trace amount of perihepatic ascites. Aortic Atherosclerosis (ICD10-I70.0). Electronically Signed   By: Trudie Reedaniel  Entrikin M.D.   On: 01/15/2020 08:09    Cardiac Studies   Echo 01/10/20 1. Left ventricular ejection fraction, by estimation, is 55 to 60%. The  left ventricle has normal function. The left ventricle has no regional  wall motion abnormalities. Left ventricular diastolic parameters are  indeterminate.  2. Right ventricular systolic function is normal. The right ventricular  size is normal.  3. Left atrial size was mildly dilated.  4. The mitral valve is normal in structure. Mild mitral valve  regurgitation. No evidence of mitral stenosis. Severe mitral annular  calcification.  5. The aortic valve is normal in structure. Aortic valve regurgitation is  not visualized. No aortic stenosis is present.  6. The inferior vena cava is dilated in size with  <50% respiratory  variability, suggesting right atrial pressure of 15 mmHg.   Pacemaker download 01/14/2020: Normal device function. In AFib persistently since November 21, 2019 (overall burden 41% in last 12 months). Occasional atrial undersensing of fib waves. Throughout most of this period, ventricular rate control has been fair, except for last 1-2 weeks. RV pacing only 14%.  Patient Profile     74 y.o. female COPD on home O2, persistent Afib, HTN, CKDIII, history of GIB, and chronic diastolic heart failure who presented with acute on chronic respiratory failure thought to be multifactorial in the setting of COPD and acute on chronic diastolic heart failure exacerbation  Assessment & Plan    #Acute on chronic diastolic heart failure: Appears back to baseline wt (166-200lbs). Holding diuretics due to worsening renal function which is now improving. -Will dose lasix 40mg  IV 1x today due to worsening respiratory status and weight gain overnight and resume home lasix 20mg  PO daily tomorrow  -Continue management of underlying COPD as likely the main contributor of ongoing hypoxia as volume status improved -Monitor I/Os and daily weights   #Persistent Afib #SSS s/p PPM placement: Last PPM interrogation revealed that patient in uninterrupted Afib since 11/2019 with fairly well controlled rates until days prior to admission -[facility] to [facility] and therefore not candidate for DCCV -If remains symptomatic from Afib, may do trial of AC to see how tolerates -Continue dilt and metop for rate control  #CAD s/p ACB: No chest pain and trops negative. TTE with preserved EF and no regional WMA. -Continue statin -Not on ASA due to GIB  #Acute on Chronic respiratory Failure #COPD On 6L [place] with continued high O2 demands. -On steroids and nebs for COPD exacerbation -Pulm consult -Follow-up high resolution CT chest -Management per primary team     For questions or updates, please  contact [facility] Please consult [email or web address] for contact info under  Signed, Meriam Sprague, MD  01/15/2020, 9:28 AM

## 2020-01-15 NOTE — Progress Notes (Signed)
Bilateral lower extremity venous duplex completed. Refer to "CV Proc" under chart review to view preliminary results.  01/15/2020 2:56 PM Eula Fried., MHA, RVT, RDCS, RDMS

## 2020-01-15 NOTE — Care Management Important Message (Signed)
Important Message  Patient Details  Name: Gabrielle [name] MRN: [id number] Date of Birth: January 26, 1946   Medicare Important Message Given:  Yes     Dorena Bodo 01/15/2020, 3:14 PM

## 2020-01-15 NOTE — Progress Notes (Signed)
OT Cancellation Note  Patient Details Name: Gabrielle [name] MRN: [id number] DOB: 08/23/1945   Cancelled Treatment:    Reason Eval/Treat Not Completed: Medical issues which prohibited therapy (Pt with increased  WOB and 02 demands, being transitioned to bipap.) Will continue to follow.  Evern Bio 01/15/2020, 12:44 PM  Martie Round, OTR/L Acute Rehabilitation Services Pager: [phone] Office: [phone]

## 2020-01-15 NOTE — Progress Notes (Signed)
PT Cancellation Note  Patient Details Name: KALEN NEIDERT MRN: [id number] DOB: 1945/11/11   Cancelled Treatment:    Reason Eval/Treat Not Completed: Patient not medically ready. RN deferred due to patient having trouble breathing. Pt in 80s on 12LO2 via [place]. Pt being transitioned to bipap. Acute PT to return as able, as appropriate to progress mobility.  Lewis Shock, PT, DPT Acute Rehabilitation Services Pager #: [phone] Office #: [phone]    Iona Hansen 01/15/2020, 10:36 AM

## 2020-01-15 NOTE — Progress Notes (Addendum)
Brief PCCM Progress Note  S: 74 yo with AECOPD with chronic hypoxic respiratory failure (home 2-4L), Acute on chronic heart failure, who has become more hypoxic this evening after removing her BiPAP.   O: SpO2 95% RR 18 HR 88 BP 133/69   Awakens to voice following commands.  Diminished breath sounds. No rhonchi. Symmetrical. Unlabored Reg rate. Occasional PVC on monitor. 1+ pulses Soft round abdomen No cyanosis or clubbing   ABG at 2246: 7.37/72.4/53.3/41.1/15.2  A: Acute on chronic respiratory failure with hypoxia and hype rcarbia COPD CHF   P: -Nocturnal Bipap. Patient is now agreeable to wear. SpO2 improving, FiO2 70% -SpO2 goal 88-92% -continue BDs, steroids as ordered  - f/u CXR -- might benefit from further diuresis (consider diamox)    No indication for ICU transfer at this time.  Please continue to encourage BiPAP compliance overnight.    Tessie Fass MSN, AGACNP-BC [facility]  [phone] 01/15/2020, 11:52 PM

## 2020-01-15 NOTE — Progress Notes (Addendum)
[facility]  PROGRESS NOTE  Gabrielle [name] YHC:[id number] DOB: [birth year] DOA: 01/22/2020 PCP: Henrine Screws, MD   Brief HPI:   74 year old female with history of COPD, HFpEF, atrial fibrillation, anemia, QT prolongation, CAD s/p MI and CABG x7, hypertension, tachybradycardia syndrome status post pacemaker presented with progressive dyspnea patient usually is on 2 L of oxygen at home for COPD.  And was requiring 5 L of oxygen.  ABG showed PCO2 of 71, pH 7.32. BNP was elevated at 471, patient started on IV Lasix.   Subjective   Patient seen and examined, feels lethargic today. She is requiring oxygen 12 L HFNC. CT chest high-resolution obtained shows findings consistent with possible pulmonary hypertension. No interstitial lung disease noted.   Assessment/Plan:     1. Acute hypercapnic respiratory failure-patient has underlying COPD and is on 2 L of oxygen at home chronically.  She presented with COPD exacerbation, received Solu-Medrol in the ED. started on DuoNeb nebulizer every 6 hours, Solu-Medrol 60 mg IV every 6 hours.  Solu-Medrol was switched to 60 mg IV every 12 hours.  Continue to wean off oxygen as tolerated.  CT chest high-resolution was obtained yesterday to look for underlying pulmonary fibrosis. CT chest did not show ILD however showed dilated pulmonary artery trunk consistent with pulmonary artery hypertension. Pulmonology to see patient and make further recommendations. 2. Diarrhea-patient had diarrhea, which is resolved.  Stool for C. difficile PCR was negative.  GI pathogen panel obtained was also negative. 3. ? Acute on chronic diastolic CHF-echocardiogram from 2020 showed grade 2 diastolic dysfunction, EF 50 to 55%.  Patient was started on Lasix 40 mg IV every 12 hours for 1 day and then transition to Lasix 40 mg daily.  She appears volume overloaded yesterday so she was started back on Lasix 40 mg IV every 12 hours, however she had very minimal urine output.  Serum  creatinine is rising.  I doubt that patient has acute diastolic CHF.  Echocardiogram done on 01/10/2020 again showed indeterminate diastolic dysfunction, EF is 55 to 60%.  Cardiology was consulted, and agree that patient does not appear to be in acute diastolic CHF.  Lasix has been discontinued. 4. Acute kidney injury on CKD stage III-patient baseline creatinine is around 1.2-1.3.  She came with creatinine 1.15, which  worsened to 1.45 with diuresis, BUN  up to 43.  Lasix was  discontinued.  Cardiology saw the patient and recommended that she does not need any more diuresis. Creatinine is almost down to baseline of 1.28. 5. Chronic atrial fibrillation-patient is not on anticoagulantion due to history of GI bleed, continue diltiazem 120 mg daily, metoprolol dose has been changed to 100 mg p.o. twice daily.  Heart rate is controlled. 6. Hypertension-continue metoprolol, diltiazem. 7. Hyperglycemia due to steroids -patient's hemoglobin A1c was 6.4 in March 2021.  She is currently on Solu-Medrol, will start sliding scale insulin NovoLog.  Hemoglobin A1c as of 01/10/2020 is 6.0. 8. Tachybradycardia syndrome s/p pacemaker placement-patient has pacemaker in place, she is having episodes of bradycardia.  Cardiology was consulted, EP reviewed EKG and telemetry strips.  Pacemaker is functioning fine as per cardiology.     COVID-19 Labs  No results for input(s): DDIMER, FERRITIN, LDH, CRP in the last 72 hours.  Lab Results  Component Value Date   SARSCOV2NAA NEGATIVE 01/12/2020   SARSCOV2NAA NEGATIVE 05/16/2019   SARSCOV2NAA NEGATIVE 02/21/2019   SARSCOV2NAA Not Detected 02/08/2019     Scheduled medications:   . atorvastatin  20 mg  Oral QPM  . citalopram  40 mg Oral Daily  . diltiazem  240 mg Oral Daily  . enoxaparin (LOVENOX) injection  40 mg Subcutaneous Q24H  . insulin aspart  0-9 Units Subcutaneous TID WC  . ipratropium-albuterol  3 mL Nebulization BID  . mouth rinse  15 mL Mouth Rinse BID  .  melatonin  10 mg Oral QHS  . methylPREDNISolone (SOLU-MEDROL) injection  60 mg Intravenous Q12H  . metoprolol tartrate  100 mg Oral BID  . pantoprazole  40 mg Oral Daily  . sodium chloride flush  3 mL Intravenous Q12H  . sucralfate  1 g Oral TID AC         CBG: Recent Labs  Lab 01/14/20 0603 01/14/20 1113 01/14/20 1610 01/14/20 2118 01/15/20 0641  GLUCAP 240* 221* 287* 141* 279*    SpO2: 91 % O2 Flow Rate (L/min): 12 L/min FiO2 (%): 40 %    CBC: Recent Labs  Lab 2020/02/06 1722 February 06, 2020 2007 Feb 06, 2020 2027 01/10/20 0423  WBC 12.8*  --   --  8.4  NEUTROABS 9.3*  --   --   --   HGB 10.5* 12.2 12.6 10.3*  HCT 37.6 36.0 37.0 36.4  MCV 93.3  --   --  92.6  PLT 210  --   --  166    Basic Metabolic Panel: Recent Labs  Lab 01/11/20 0055 01/12/20 0100 01/13/20 0111 01/14/20 0040 01/15/20 0240  NA 141 143 140 139 140  K 4.0 3.7 3.5 3.9 3.5  CL 96* 94* 91* 93* 91*  CO2 35* 35* 39* 35* 36*  GLUCOSE 174* 161* 164* 185* 176*  BUN 14 22 36* 43* 42*  CREATININE 1.16* 1.38* 1.45* 1.44* 1.28*  CALCIUM 9.8 9.9 9.5 9.6 9.7     Liver Function Tests: Recent Labs  Lab 2020-02-06 1722  AST 17  ALT 7  ALKPHOS 46  BILITOT 0.7  PROT 6.7  ALBUMIN 3.7     Antibiotics: Anti-infectives (From admission, onward)   None       DVT prophylaxis: Lovenox  Code Status: Full code  Family Communication: No family at bedside   Consultants:  Cardiology  Pulmonology  Procedures:      Objective   Vitals:   01/14/20 2016 01/14/20 2336 01/15/20 0326 01/15/20 0745  BP: (!) 133/99 114/69 (!) 143/95 (!) 131/92  Pulse: 78 83 74 86  Resp: 19 15 17  (!) 25  Temp: 97.6 F (36.4 C) 97.8 F (36.6 C) 98 F (36.7 C) 97.6 F (36.4 C)  TempSrc: Oral Oral Oral Oral  SpO2: 93% 90% 90% 91%  Weight:      Height:        Intake/Output Summary (Last 24 hours) at 01/15/2020 0926 Last data filed at 01/15/2020 0800 Gross per 24 hour  Intake 3 ml  Output 1100 ml  Net  -1097 ml    11/13 1901 - 11/15 0700 In: 123 [P.O.:120; I.V.:3] Out: 700 [Urine:700]  Filed Weights   01/11/20 0421 01/12/20 0412 01/14/20 0311  Weight: 89.7 kg 89 kg 90.6 kg    Physical Examination:  General-appears in no acute distress Heart-S1-S2, regular, no murmur auscultated Lungs-decreased breath sounds bilaterally on auscultation Abdomen-soft, nontender, no organomegaly Extremities-no edema in the lower extremities Neuro-alert, oriented x3, no focal deficit noted  Status is: Inpatient  Dispo: The patient is from: Home              Anticipated d/c is to: Home  Anticipated d/c date is: 01/20/2020              Patient currently not medically stable for discharge  Barrier to discharge-ongoing treatment for acute hypercapnic respiratory failure       Data Reviewed:   Recent Results (from the past 240 hour(s))  Respiratory Panel by RT PCR (Flu A&B, Covid) - Nasopharyngeal Swab     Status: None   Collection Time: 01/31/2020  9:12 PM   Specimen: Nasopharyngeal Swab  Result Value Ref Range Status   SARS Coronavirus 2 by RT PCR NEGATIVE NEGATIVE Final    Comment: (NOTE) SARS-CoV-2 target nucleic acids are NOT DETECTED.  The SARS-CoV-2 RNA is generally detectable in upper respiratoy specimens during the acute phase of infection. The lowest concentration of SARS-CoV-2 viral copies this assay can detect is 131 copies/mL. A negative result does not preclude SARS-Cov-2 infection and should not be used as the sole basis for treatment or other patient management decisions. A negative result may occur with  improper specimen collection/handling, submission of specimen other than nasopharyngeal swab, presence of viral mutation(s) within the areas targeted by this assay, and inadequate number of viral copies (<131 copies/mL). A negative result must be combined with clinical observations, patient history, and epidemiological information. The expected result is  Negative.  Fact Sheet for Patients:  https://www.moore.com/  Fact Sheet for Healthcare Providers:  https://www.young.biz/  This test is no t yet approved or cleared by the [place] FDA and  has been authorized for detection and/or diagnosis of SARS-CoV-2 by FDA under an Emergency Use Authorization (EUA). This EUA will remain  in effect (meaning this test can be used) for the duration of the COVID-19 declaration under Section 564(b)(1) of the Act, 21 U.S.C. section 360bbb-3(b)(1), unless the authorization is terminated or revoked sooner.     Influenza A by PCR NEGATIVE NEGATIVE Final   Influenza B by PCR NEGATIVE NEGATIVE Final    Comment: (NOTE) The Xpert Xpress SARS-CoV-2/FLU/RSV assay is intended as an aid in  the diagnosis of influenza from Nasopharyngeal swab specimens and  should not be used as a sole basis for treatment. Nasal washings and  aspirates are unacceptable for Xpert Xpress SARS-CoV-2/FLU/RSV  testing.  Fact Sheet for Patients: https://www.moore.com/  Fact Sheet for Healthcare Providers: https://www.young.biz/  This test is not yet approved or cleared by the [place] FDA and  has been authorized for detection and/or diagnosis of SARS-CoV-2 by  FDA under an Emergency Use Authorization (EUA). This EUA will remain  in effect (meaning this test can be used) for the duration of the  Covid-19 declaration under Section 564(b)(1) of the Act, 21  U.S.C. section 360bbb-3(b)(1), unless the authorization is  terminated or revoked. Performed at [facility], [address] [place], [place] [address]   MRSA PCR Screening     Status: None   Collection Time: 01/10/20 12:58 AM   Specimen: Nasal Mucosa; Nasopharyngeal  Result Value Ref Range Status   MRSA by PCR NEGATIVE NEGATIVE Final    Comment:        The GeneXpert MRSA Assay (FDA approved for NASAL specimens only), is one  component of a comprehensive MRSA colonization surveillance program. It is not intended to diagnose MRSA infection nor to guide or monitor treatment for MRSA infections. Performed at [facility], [address], [place], [place] [address]   C Difficile Quick Screen w PCR reflex     Status: None   Collection Time: 01/12/20  5:02 AM   Specimen: STOOL  Result Value Ref Range Status   C Diff antigen NEGATIVE NEGATIVE Final   C Diff toxin NEGATIVE NEGATIVE Final   C Diff interpretation No C. difficile detected.  Final    Comment: Performed at [facility], [address] [place], [place] [address]  Gastrointestinal Panel by PCR , Stool     Status: None   Collection Time: 01/12/20  5:02 AM   Specimen: STOOL  Result Value Ref Range Status   Campylobacter species NOT DETECTED NOT DETECTED Final   Plesimonas shigelloides NOT DETECTED NOT DETECTED Final   Salmonella species NOT DETECTED NOT DETECTED Final   Yersinia enterocolitica NOT DETECTED NOT DETECTED Final   Vibrio species NOT DETECTED NOT DETECTED Final   Vibrio cholerae NOT DETECTED NOT DETECTED Final   Enteroaggregative E coli (EAEC) NOT DETECTED NOT DETECTED Final   Enteropathogenic E coli (EPEC) NOT DETECTED NOT DETECTED Final   Enterotoxigenic E coli (ETEC) NOT DETECTED NOT DETECTED Final   Shiga like toxin producing E coli (STEC) NOT DETECTED NOT DETECTED Final   Shigella/Enteroinvasive E coli (EIEC) NOT DETECTED NOT DETECTED Final   Cryptosporidium NOT DETECTED NOT DETECTED Final   Cyclospora cayetanensis NOT DETECTED NOT DETECTED Final   Entamoeba histolytica NOT DETECTED NOT DETECTED Final   Giardia lamblia NOT DETECTED NOT DETECTED Final   Adenovirus F40/41 NOT DETECTED NOT DETECTED Final   Astrovirus NOT DETECTED NOT DETECTED Final   Norovirus GI/GII NOT DETECTED NOT DETECTED Final   Rotavirus A NOT DETECTED NOT DETECTED Final   Sapovirus (I, II, IV, and V) NOT DETECTED NOT DETECTED Final    Comment:  Performed at [facility], [address] [place], [place] [address]    BNP (last 3 results) Recent Labs    02/21/19 2213 05/16/19 1840 01/20/2020 1722  BNP 484.5* 1,338.5* 471.2*      Camaryn Lumbert S Irys Nigh   [facility] If 7PM-7AM, please contact night-coverage at [email or web address], Office  [phone]   01/15/2020, 9:26 AM  LOS: 5 days

## 2020-01-15 NOTE — Consult Note (Addendum)
NAME:  Gabrielle [name], MRN:  [id number], DOB:  May 29, 1945, LOS: 5 ADMISSION DATE:  02/05/2020, CONSULTATION DATE:  01/15/2020 REFERRING MD: Dr. Sharl Ma, CHIEF COMPLAINT: Acute hypercapnic respiratory failure  Brief History   74 year old female presented 11/9 for progressive dyspnea.  Known history of hypoxic respiratory failure on chronic supplemental oxygen of 2 to 4 L, COPD, and current everyday smoker.  History of present illness   [name] is a 74 year old female with a past medical history significant for chronic hypoxic and hypercapnic respiratory failure requiring 2 to 4 L supplemental oxygen at baseline, COPD, current everyday smoker,HFpEF, atrial fibrillation, anemia, CAD status post MI and CABG x7, hypertension, tachybradycardia syndrome requiring placement of pacemaker, and anemia who presented to the emergency department 11/9 for complaints of progressive dyspnea.  Patient reports dyspnea has progressively been worsening over the last 3 weeks.  She reports despite use of supplemental oxygen and home nebulizers shortness of breath has progressively worsened.  Patient also reported weight increase prior to admission, orthopnea, and recent diarrhea.    On arrival to emergency department she was seen hypoxic with a saturation of 86% on 2 L with other vital signs within normal limits.  Lab work significant for bicarb 34 creatinine 1.27, and BNP 471  During admission patient has been treated for acute COPD exasperation and questionable diastolic congestive heart failure exasperation.  She has received increased supplemental oxygen, bronchodilators, and IV steroids with little improvement in dyspnea.  Underwent high-resolution CT scan chest to look for underlying ILD, this was not identified. PCCM consulted for further assistance in management.  Past Medical History  HTN HLD MI with CAD and CABG x7 COPD  Chronic hypoxic and hypercapnic respiratory failure  Chronic  A-fib ARF HFpEF  Significant Hospital Events   Admitted 11/9  Consults:  Cardiology  Pulmonary   Procedures:    Significant Diagnostic Tests:  High resolution CT chest 11/15 > several areas of scaring likely related to prior infection, no ILD. Pulmonary artery hypertension.   ECHO 11/10 > EF 55-60% with no observed diastolic dysfunction   Micro Data:  COVID 11/09 > negative  MRSA PCR 11/12 > negative  C-diff 11/12 > negative  GI panel 11/12 > negative   Antimicrobials:    Interim history/subjective:  Recently used bedside commode and just transferred back to bed. She states she feels more dyspneic today especially with mobility.   Objective   Blood pressure (!) 131/92, pulse 86, temperature 97.6 F (36.4 C), temperature source Oral, resp. rate (!) 25, height 5' 2.5" (1.588 m), weight 90.6 kg, SpO2 91 %.        Intake/Output Summary (Last 24 hours) at 01/15/2020 0955 Last data filed at 01/15/2020 0800 Gross per 24 hour  Intake 3 ml  Output 1100 ml  Net -1097 ml   Filed Weights   01/11/20 0421 01/12/20 0412 01/14/20 0311  Weight: 89.7 kg 89 kg 90.6 kg   Examination: General: Chronically ill appearing elderly female lying in bed mildly dyspneic but in NAD HEENT: [place]/AT, MM pink/moist, PERRL, 12L [place] Neuro: Alert and oriented x3, non-focal  CV: s1s2 regular rate and rhythm, no murmur, rubs, or gallops,  PULM: Diminished air entry bilaterally, no added breath sounds, mild accessory muscle use seen, oxygen saturations 85-88% on 12L [place]  GI: soft, bowel sounds active in all 4 quadrants, non-tender, non-distended Extremities: warm/dry, no edema  Skin: no rashes or lesions  Resolved Hospital Problem list     Assessment & Plan:  Acute on chronic hypoxic and hypercapnic respiratory failure  -Requires 2-4L [place] at baseline -Per chart review and patient history she has no seen a pulmonologist in 5 years, when asked why she states " I don't know" COPD with exacerbation  on presentation  Cleda Daub 02/05/2012: FeV1 1.10 53%  FVC 1.49 56% FeV1/FVC 72% fef 25 75 % 42% -high resolution chest CT with several areas of scaring likely related to prior infection, no ILD. Pulmonary artery hypertension.  Active tobacco abuse  -Patient reports she continue to smoke 1-2 cigarettes per day  P: High suspicion for progression of COPD  PRN BIPAP for increased work of breathing  Continue steroids  Mobilize as able  Short acting bronchodilators PRN Continue Duonebs will also add Pulmicort  Procal low with no cough or fever No indications for antibiotics  Smoking cessation education provided  Appropriate vaccinations prior to discharge  Patient will need to follow up with pulmonary at discharge    Rest of acute and chronic conditions managed per primary   Best practice:  Diet: Heart healthy  Pain/Anxiety/Delirium protocol (if indicated): PRN's VAP protocol (if indicated): In palce DVT prophylaxis: Lovenox  GI prophylaxis: PPI Glucose control: SSI Mobility: Up with assistance  Code Status: Full Family Communication: Patient updated at bedside  Disposition: Progressive   Labs   CBC: Recent Labs  Lab 01/15/2020 1722 01/15/20 2007 Jan 15, 2020 2027 01/10/20 0423  WBC 12.8*  --   --  8.4  NEUTROABS 9.3*  --   --   --   HGB 10.5* 12.2 12.6 10.3*  HCT 37.6 36.0 37.0 36.4  MCV 93.3  --   --  92.6  PLT 210  --   --  166    Basic Metabolic Panel: Recent Labs  Lab 01/11/20 0055 01/12/20 0100 01/13/20 0111 01/14/20 0040 01/15/20 0240  NA 141 143 140 139 140  K 4.0 3.7 3.5 3.9 3.5  CL 96* 94* 91* 93* 91*  CO2 35* 35* 39* 35* 36*  GLUCOSE 174* 161* 164* 185* 176*  BUN 14 22 36* 43* 42*  CREATININE 1.16* 1.38* 1.45* 1.44* 1.28*  CALCIUM 9.8 9.9 9.5 9.6 9.7   GFR: Estimated Creatinine Clearance: 39.5 mL/min (A) (by C-G formula based on SCr of 1.28 mg/dL (H)). Recent Labs  Lab 2020/01/15 1722 01/10/20 0423  WBC 12.8* 8.4    Liver Function Tests: Recent  Labs  Lab January 15, 2020 1722  AST 17  ALT 7  ALKPHOS 46  BILITOT 0.7  PROT 6.7  ALBUMIN 3.7   No results for input(s): LIPASE, AMYLASE in the last 168 hours. No results for input(s): AMMONIA in the last 168 hours.  ABG    Component Value Date/Time   PHART 7.324 (L) January 15, 2020 2007   PCO2ART 71.5 (HH) January 15, 2020 2007   PO2ART 76 (L) 15-Jan-2020 2007   HCO3 36.9 (H) 01-15-2020 2027   TCO2 39 (H) 01-15-2020 2027   ACIDBASEDEF 1.6 02/22/2019 0916   O2SAT 86.0 15-Jan-2020 2027     Coagulation Profile: No results for input(s): INR, PROTIME in the last 168 hours.  Cardiac Enzymes: No results for input(s): CKTOTAL, CKMB, CKMBINDEX, TROPONINI in the last 168 hours.  HbA1C: Hgb A1c MFr Bld  Date/Time Value Ref Range Status  01/10/2020 04:23 AM 6.0 (H) 4.8 - 5.6 % Final    Comment:    (NOTE) Pre diabetes:          5.7%-6.4%  Diabetes:              >  6.4%  Glycemic control for   <7.0% adults with diabetes   05/20/2019 03:18 AM 6.4 (H) 4.8 - 5.6 % Final    Comment:    (NOTE) Pre diabetes:          5.7%-6.4% Diabetes:              >6.4% Glycemic control for   <7.0% adults with diabetes     CBG: Recent Labs  Lab 01/14/20 0603 01/14/20 1113 01/14/20 1610 01/14/20 2118 01/15/20 0641  GLUCAP 240* 221* 287* 141* 279*    Review of Systems:   Gen: Denies fever, chills, weight change, fatigue, night sweats HEENT: Denies blurred vision, double vision, hearing loss, tinnitus, sinus congestion, rhinorrhea, sore throat, neck stiffness, dysphagia PULM: Denies shortness of breath, cough, sputum production, hemoptysis, wheezing CV: Denies chest pain, edema, orthopnea, paroxysmal nocturnal dyspnea, palpitations GI: Denies abdominal pain, nausea, vomiting, diarrhea, hematochezia, melena, constipation, change in bowel habits GU: Denies dysuria, hematuria, polyuria, oliguria, urethral discharge Endocrine: Denies hot or cold intolerance, polyuria, polyphagia or appetite change Derm:  Denies rash, dry skin, scaling or peeling skin change Heme: Denies easy bruising, bleeding, bleeding gums Neuro: Denies headache, numbness, weakness, slurred speech, loss of memory or consciousness   Past Medical History  She,  has a past medical history of Acute congestive heart failure (HCC), Acute lower UTI (07/01/2018), Acute on chronic diastolic CHF (congestive heart failure) /EF 55 % (08/19/2016), Acute renal failure (ARF) (HCC), Acute respiratory failure (HCC) (02/21/2019), AKI (acute kidney injury) (HCC) (02/21/2019), Anemia, Anemia due to blood loss, acute (10/27/2015), Arterial hypotension, Arthritis, ARTHRITIS (12/13/2008), Asthma, Atrial fibrillation with controlled ventricular response (HCC) (11/2009), Atrial fibrillation with RVR (HCC) (07/02/2018), Chronic anticoagulation, Chronic bronchitis, Chronic diastolic CHF (congestive heart failure) (HCC), Chronic headache, COPD (chronic obstructive pulmonary disease) (HCC), Depression, Heart attack (HCC), Hyperlipidemia, and Hypertension.   Surgical History    Past Surgical History:  Procedure Laterality Date  . CAD-CABG     x7, brief post-op Atrial Fib  . CESAREAN SECTION     x2  . COLONOSCOPY WITH PROPOFOL N/A 10/24/2015   Procedure: COLONOSCOPY WITH PROPOFOL;  Surgeon: Carman Ching, MD;  Location: [facility] ENDOSCOPY;  Service: Endoscopy;  Laterality: N/A;  . CORONARY ARTERY BYPASS GRAFT  2011  . EP IMPLANTABLE DEVICE N/A 12/26/2015   Procedure: Pacemaker Implant;  Surgeon: Marinus Maw, MD;  Location: [facility] INVASIVE CV LAB;  Service: Cardiovascular;  Laterality: N/A;  . ESOPHAGOGASTRODUODENOSCOPY N/A 10/22/2015   Procedure: ESOPHAGOGASTRODUODENOSCOPY (EGD);  Surgeon: Carman Ching, MD;  Location: [facility] ENDOSCOPY;  Service: Endoscopy;  Laterality: N/A;  . ESOPHAGOGASTRODUODENOSCOPY N/A 12/24/2015   Procedure: ESOPHAGOGASTRODUODENOSCOPY (EGD);  Surgeon: Carman Ching, MD;  Location: [facility] ENDOSCOPY;  Service: Endoscopy;  Laterality: N/A;  . JOINT  REPLACEMENT Bilateral   . TONSILLECTOMY    . TOTAL KNEE ARTHROPLASTY       Social History   reports that she has been smoking cigarettes. She has been smoking about 0.20 packs per day. She has never used smokeless tobacco. She reports that she does not drink alcohol and does not use drugs.   Family History   Her family history includes Diabetes in her mother; Heart attack in her father; Heart disease in her father and mother; Hypertension in her daughter, father, and mother; Lung cancer in her maternal grandfather and paternal grandfather; Stroke in her paternal grandmother.   Allergies Allergies  Allergen Reactions  . Novocain [Procaine] Hives and Palpitations  . Penicillins Hives and Other (See Comments)  Has patient had a PCN reaction causing immediate rash, facial/tongue/throat swelling, SOB or lightheadedness with hypotension: No Has patient had a PCN reaction causing severe rash involving mucus membranes or skin necrosis: No Has patient had a PCN reaction that required hospitalization No Has patient had a PCN reaction occurring within the last 10 years: No If all of the above answers are "NO", then may proceed with Cephalosporin use.     Home Medications  Prior to Admission medications   Medication Sig Start Date End Date Taking? Authorizing Provider  albuterol (PROVENTIL HFA;VENTOLIN HFA) 108 (90 Base) MCG/ACT inhaler Inhale 2 puffs into the lungs every 6 (six) hours as needed for wheezing or shortness of breath.   Yes [provider]  ALPRAZolam (XANAX) 0.25 MG tablet Take 1 tablet (0.25 mg total) by mouth at bedtime as needed for anxiety. 05/22/19  Yes Dow AdolphHall, Carole N, DO  atorvastatin (LIPITOR) 20 MG tablet Take 1 tablet (20 mg total) by mouth daily. Patient taking differently: Take 20 mg by mouth every evening.  11/21/19  Yes Jake BatheSkains, Mark C, MD  budesonide-formoterol (SYMBICORT) 160-4.5 MCG/ACT inhaler Inhale 2 puffs into the lungs 2 (two) times daily. 01/15/14  Yes  Storm FriskWright, Patrick E, MD  citalopram (CELEXA) 20 MG tablet Take 40 mg by mouth in the morning.  07/08/16  Yes [provider]  clobetasol cream (TEMOVATE) 0.05 % Apply 1 application topically 2 (two) times daily. Affected skin 05/21/16  Yes Vassie LollMadera, Carlos, MD  diltiazem [facility](TIAZAC) 120 MG 24 hr capsule TAKE 1 CAPSULE BY MOUTH DAILY Patient taking differently: Take 120 mg by mouth in the morning.  11/21/19  Yes Jake BatheSkains, Mark C, MD  docusate sodium (COLACE) 100 MG capsule Take 100 mg by mouth 2 (two) times daily as needed for moderate constipation.    Yes [provider]  fluticasone (FLONASE) 50 MCG/ACT nasal spray Place 2 sprays into both nostrils daily. 12/21/15  Yes Albertine GratesXu, Fang, MD  furosemide (LASIX) 20 MG tablet TAKE 1 TABLET BY MOUTH DAILY Patient taking differently: Take 20 mg by mouth daily.  11/21/19  Yes Jake BatheSkains, Mark C, MD  Melatonin 10 MG TABS Take 10 mg by mouth at bedtime.   Yes [provider]  metoprolol tartrate (LOPRESSOR) 50 MG tablet Take 1.5 tablets (75 mg total) by mouth 2 (two) times daily. 04/12/19  Yes Jake BatheSkains, Mark C, MD  pantoprazole (PROTONIX) 40 MG tablet Take 1 tablet (40 mg total) by mouth daily. 08/16/19  Yes Jake BatheSkains, Mark C, MD  potassium chloride (KLOR-CON) 10 MEQ tablet Take 1 tablet (10 mEq total) by mouth daily. 08/16/19  Yes Jake BatheSkains, Mark C, MD  sucralfate (CARAFATE) 1 g tablet Take 1 g by mouth 3 (three) times daily before meals.   Yes [provider]  tiotropium (SPIRIVA) 18 MCG inhalation capsule Place 18 mcg into inhaler and inhale daily.   Yes [provider]  tiZANidine (ZANAFLEX) 4 MG tablet Take 4 mg by mouth 2 (two) times daily as needed for muscle spasms.  01/03/20  Yes [provider]     Signature:  Delfin GantWhitney F Mistey Hoffert, NP-C [facility] Contact / Pager information can be found on Amion  01/15/2020, 10:57 AM

## 2020-01-15 NOTE — Progress Notes (Signed)
eLink Physician-Brief Progress Note Patient Name: Gabrielle [name] DOB: [birth year] MRN: [id number]   Date of Service  01/15/2020  HPI/Events of Note  Hypoxia - Increased O2 requirement and patient more lethargic. Refuses to keep BiPAP on. Sat = 84-85% on BiPAP. ABG = 7.37/72.4/53.3/41. ABG c/w chronic severe hypoxic and hypercarbic respiratory failure. Her pH suggests that she lives close to here pCO2 wise. Looks like she has diuresed 2709 mL since admission.   eICU Interventions  Plan: 1. Portable CXR STAT. 2. Will ask the ground team to evaluate the patient at bedside.     Intervention Category Major Interventions: Change in mental status - evaluation and management;Hypoxemia - evaluation and management  Lenell Antu 01/15/2020, 11:32 PM

## 2020-01-16 DIAGNOSIS — R0902 Hypoxemia: Secondary | ICD-10-CM

## 2020-01-16 DIAGNOSIS — I4819 Other persistent atrial fibrillation: Secondary | ICD-10-CM | POA: Diagnosis not present

## 2020-01-16 DIAGNOSIS — J9621 Acute and chronic respiratory failure with hypoxia: Secondary | ICD-10-CM | POA: Diagnosis not present

## 2020-01-16 DIAGNOSIS — I495 Sick sinus syndrome: Secondary | ICD-10-CM | POA: Diagnosis not present

## 2020-01-16 DIAGNOSIS — I5033 Acute on chronic diastolic (congestive) heart failure: Secondary | ICD-10-CM | POA: Diagnosis not present

## 2020-01-16 LAB — BASIC METABOLIC PANEL
Anion gap: 14 (ref 5–15)
BUN: 49 mg/dL — ABNORMAL HIGH (ref 8–23)
CO2: 37 mmol/L — ABNORMAL HIGH (ref 22–32)
Calcium: 10 mg/dL (ref 8.9–10.3)
Chloride: 94 mmol/L — ABNORMAL LOW (ref 98–111)
Creatinine, Ser: 1.41 mg/dL — ABNORMAL HIGH (ref 0.44–1.00)
GFR, Estimated: 39 mL/min — ABNORMAL LOW (ref >60)
Glucose, Bld: 206 mg/dL — ABNORMAL HIGH (ref 70–99)
Potassium: 4 mmol/L (ref 3.5–5.1)
Sodium: 145 mmol/L (ref 135–145)

## 2020-01-16 LAB — GLUCOSE, CAPILLARY
Glucose-Capillary: 189 mg/dL — ABNORMAL HIGH (ref 70–99)
Glucose-Capillary: 207 mg/dL — ABNORMAL HIGH (ref 70–99)
Glucose-Capillary: 263 mg/dL — ABNORMAL HIGH (ref 70–99)
Glucose-Capillary: 275 mg/dL — ABNORMAL HIGH (ref 70–99)
Glucose-Capillary: 186 mg/dL — ABNORMAL HIGH (ref 70–99)

## 2020-01-16 MED ORDER — ACETAZOLAMIDE 250 MG PO TABS
500.0000 mg | ORAL_TABLET | Freq: Once | ORAL | Status: AC
  Administered 2020-01-16: 500 mg via ORAL
  Filled 2020-01-16: qty 2

## 2020-01-16 MED ORDER — FUROSEMIDE 10 MG/ML IJ SOLN
40.0000 mg | Freq: Once | INTRAMUSCULAR | Status: AC
  Administered 2020-01-16: 40 mg via INTRAVENOUS
  Filled 2020-01-16: qty 4

## 2020-01-16 MED ORDER — PREDNISONE 20 MG PO TABS: 20.0000 mg | ORAL_TABLET | Freq: Every day | ORAL | Status: DC

## 2020-01-16 MED ORDER — IPRATROPIUM-ALBUTEROL 0.5-2.5 (3) MG/3ML IN SOLN
3.0000 mL | Freq: Three times a day (TID) | RESPIRATORY_TRACT | Status: DC
  Administered 2020-01-16 – 2020-01-18 (×7): 3 mL via RESPIRATORY_TRACT
  Filled 2020-01-16 (×8): qty 3

## 2020-01-16 MED ORDER — PREDNISONE 20 MG PO TABS
40.0000 mg | ORAL_TABLET | Freq: Every day | ORAL | Status: AC
  Administered 2020-01-17 – 2020-01-18 (×2): 40 mg via ORAL
  Filled 2020-01-16 (×2): qty 2

## 2020-01-16 NOTE — Progress Notes (Signed)
Physical Therapy Treatment Patient Details Name: Gabrielle [name] MRN: [id number] DOB: 1945/04/25 Today's Date: 01/16/2020    History of Present Illness 74 year old female with history of COPD, HFpEF, atrial fibrillation, anemia, QT prolongation, CAD s/p MI and CABG x7, hypertension, tachybradycardia syndrome status post pacemaker presented with progressive dyspnea patient usually is on 2 L of oxygen at home for COPD.  And was requiring 5 L of oxygen.  ABG showed PCO2 of 71, pH 7.32.    PT Comments    Pt dozing in recliner on entry, agreeable to working with therapy and getting back to bed. RN plan to place CPAP for afternoon. Pt is limited in safe mobility by oxygen desaturation (see General Comments) and generalized weakness. Pt is currently min guard for transfers, and min A for ambulation with RW and for return to bed. D/c plan remains appropriate at this time. PT will continue to follow acutely.     Follow Up Recommendations  SNF     Equipment Recommendations  Other (comment) (TBD at next venue)       Precautions / Restrictions Precautions Precautions: Fall Precaution Comments: monitor O2 Restrictions Weight Bearing Restrictions: No    Mobility  Bed Mobility Overal bed mobility: Needs Assistance Bed Mobility: Sit to Supine       Sit to supine: HOB elevated;Min assist   General bed mobility comments: minA for bringing LE back into bed and squaring shoulders  Transfers Overall transfer level: Needs assistance Equipment used: None Transfers: Sit to/from Stand Sit to Stand: Min guard         General transfer comment: min guard to power up and steady in RW   Ambulation/Gait Ambulation/Gait assistance: Min assist Gait Distance (Feet): 18 Feet Assistive device: Rolling walker (2 wheeled) Gait Pattern/deviations: Step-through pattern;Decreased step length - right;Decreased step length - left;Trunk flexed Gait velocity: slowed Gait velocity interpretation: <1.31  ft/sec, indicative of household ambulator General Gait Details: min A for steadying with RW, constant vc for upright posture and proximity to RW          Balance Overall balance assessment: Needs assistance Sitting-balance support: No upper extremity supported;Feet supported Sitting balance-Leahy Scale: Good     Standing balance support: Bilateral upper extremity supported Standing balance-Leahy Scale: Fair                              Cognition Arousal/Alertness: Awake/alert Behavior During Therapy: WFL for tasks assessed/performed Overall Cognitive Status: Within Functional Limits for tasks assessed                                           General Comments General comments (skin integrity, edema, etc.): Pt on 13L O2 via HFNC on entry with SaO2 in low 90s, with ambulation on 15L O2  SaO2 dropped to low 80s, returned to supine with HoB elevated, pt with increased difficulty rebounding despite purse lip breathing, with increased upright sitting and productive cough SaO2 incresed to low 90s       Pertinent Vitals/Pain Pain Assessment: Faces Faces Pain Scale: Hurts a little bit Pain Location: generalized  Pain Descriptors / Indicators: Grimacing Pain Intervention(s): Limited activity within patient's tolerance;Monitored during session;Repositioned           PT Goals (current goals can now be found in the care plan section) Acute Rehab PT Goals Patient  Stated Goal: Wants to move a little better PT Goal Formulation: With patient Time For Goal Achievement: 01/24/20 Potential to Achieve Goals: Good Progress towards PT goals: Not progressing toward goals - comment (increased O2 demand )    Frequency    Min 2X/week      PT Plan Current plan remains appropriate       AM-PAC PT "6 Clicks" Mobility   Outcome Measure  Help needed turning from your back to your side while in a flat bed without using bedrails?: None Help needed moving from  lying on your back to sitting on the side of a flat bed without using bedrails?: A Little Help needed moving to and from a bed to a chair (including a wheelchair)?: A Little Help needed standing up from a chair using your arms (e.g., wheelchair or bedside chair)?: A Little Help needed to walk in hospital room?: A Little Help needed climbing 3-5 steps with a railing? : A Lot 6 Click Score: 18    End of Session Equipment Utilized During Treatment: Gait belt;Oxygen Activity Tolerance: Treatment limited secondary to medical complications (Comment) (oxygen desaturation ) Patient left: in bed;with call bell/phone within reach;with bed alarm set Nurse Communication: Mobility status PT Visit Diagnosis: Unsteadiness on feet (R26.81);Difficulty in walking, not elsewhere classified (R26.2);Muscle weakness (generalized) (M62.81)     Time: 1308-6578 PT Time Calculation (min) (ACUTE ONLY): 36 min  Charges:  $Therapeutic Exercise: 8-22 mins $Therapeutic Activity: 8-22 mins                     [name] B. Beverely Risen PT, DPT Acute Rehabilitation Services Pager [phone] Office [phone]    [name] 01/16/2020, 3:58 PM

## 2020-01-16 NOTE — Plan of Care (Signed)
  Problem: Education: Goal: Knowledge of General Education information will improve Description: Including pain rating scale, medication(s)/side effects and non-pharmacologic comfort measures Outcome: Progressing   Problem: Health Behavior/Discharge Planning: Goal: Ability to manage health-related needs will improve Outcome: Progressing   Problem: Clinical Measurements: Goal: Ability to maintain clinical measurements within normal limits will improve Outcome: Progressing   Problem: Clinical Measurements: Goal: Respiratory complications will improve Outcome: Progressing   Problem: Coping: Goal: Level of anxiety will decrease Outcome: Progressing   Problem: Pain Managment: Goal: General experience of comfort will improve Outcome: Progressing

## 2020-01-16 NOTE — Progress Notes (Signed)
  ReDS Clip Diuretic Study Pt study # [id number]  Your patient has been enrolled in the ReDS Clip Diuretic Study  ReDS reading today 19% Remains within normal range < 35%  CXR showing bilateral pleural effusion with R>L concerning for volume load. Scr 1.28>1.41. Wt 202 to 196 lb. UOP -1.1 L/24 hr (net-2.8 L).   Reported dry weight is 189 lbs.  Changes to prescribed diuretics recommended:  Question REDs reading - discussed with MD and plan for diamox and possible further diuresis pending uop later today.   Provider contacted: Dr. Shari Prows Recommendation was accepted by provider.   REDS Clip  READING= 19%  CHEST RULER = 38 Clip Station = B   Orthodema score = 3 Signs/Symptoms Score   Mild edema, no orthopnea 0 No congestion  Moderate edema, no orthopnea 1 Low-grade orthodema/congestion  Severe edema OR orthopnea 2   Moderate edema and orthopnea 3 High-grade orthodema/congestion  Severe edema AND orthopnea 4    Sherron Monday, PharmD, BCCCP Clinical Pharmacist  Phone: [phone] 01/16/2020 10:34 AM  Please check AMION for all [facility] Pharmacy phone numbers After 10:00 PM, call Main Pharmacy [phone]

## 2020-01-16 NOTE — Progress Notes (Signed)
Patient lethargic upon receving this evening and is A&OX3. She became more hypoxic on oxygen High Flow [place] max at 15L SpO2 85%. Called RT bedside to assist with Bipap SpO2 up to 88%. ABG done by RT per protocol. She kept Bipap on for about an hour before pulling off and refusing. I notified Critical Care MD and  NP came to bedside to assess and spoke with patient and she agreed to wear mask. CXR done at bedside. I called Primary MD made her also aware and asked that she review the newest CXR. MD ordered Lasix IV and spoke with patient via phone to reinforce the need to wear Bipap and explained the alternative of having to intubate her to breathe. Patient is currently wearing mask she has removed a few times requesting a drink which I denied her and placed the mask back on. SpO2 remain at 88% on Bipap goal rate is 88-92%. Please see MD, NP and RT notes for further details.

## 2020-01-16 NOTE — Progress Notes (Signed)
NAME:  Gabrielle [name], MRN:  [id number], DOB:  [birth year], LOS: 6 ADMISSION DATE:  29-Jan-2020, CONSULTATION DATE:  01/15/2020 REFERRING MD: Dr. Sharl Ma, CHIEF COMPLAINT: Acute hypercapnic respiratory failure  Brief History   74 year old female presented 11/9 for progressive dyspnea.  Known history of hypoxic respiratory failure on chronic supplemental oxygen of 2 to 4 L, COPD, and current everyday smoker.  History of present illness   [name] is a 74 year old female with a past medical history significant for chronic hypoxic and hypercapnic respiratory failure requiring 2 to 4 L supplemental oxygen at baseline, COPD, current everyday smoker,HFpEF, atrial fibrillation, anemia, CAD status post MI and CABG x7, hypertension, tachybradycardia syndrome requiring placement of pacemaker, and anemia who presented to the emergency department 11/9 for complaints of progressive dyspnea.  Patient reports dyspnea has progressively been worsening over the last 3 weeks.  She reports despite use of supplemental oxygen and home nebulizers shortness of breath has progressively worsened.  Patient also reported weight increase prior to admission, orthopnea, and recent diarrhea.    On arrival to emergency department she was seen hypoxic with a saturation of 86% on 2 L with other vital signs within normal limits.  Lab work significant for bicarb 34 creatinine 1.27, and BNP 471  During admission patient has been treated for acute COPD exasperation and questionable diastolic congestive heart failure exasperation.  She has received increased supplemental oxygen, bronchodilators, and IV steroids with little improvement in dyspnea.  Underwent high-resolution CT scan chest to look for underlying ILD, this was not identified. PCCM consulted for further assistance in management.  Past Medical History  HTN HLD MI with CAD and CABG x7 COPD  Chronic hypoxic and hypercapnic respiratory failure  Chronic  A-fib ARF HFpEF  Significant Hospital Events   Admitted 11/9  Consults:  Cardiology  Pulmonary   Procedures:    Significant Diagnostic Tests:  High resolution CT chest 11/15 > several areas of scaring likely related to prior infection, no ILD. Pulmonary artery hypertension.   ECHO 11/10 > EF 55-60% with no observed diastolic dysfunction   Micro Data:  COVID 11/09 > negative  MRSA PCR 11/12 > negative  C-diff 11/12 > negative  GI panel 11/12 > negative   Antimicrobials:    Interim history/subjective:  PCCM called overnight for increasing dyspnea. She was placed on Bipap overnight. She is alert and awake sitting up in chair eating breakfast this morning. She complains of mild abdominal pain. No nausea or vomiting. She is on 12L [place] this morning.  Chest radiograph overnight showing increasing bilateral pleural effusions.   Objective   Blood pressure 129/90, pulse 95, temperature 97.6 F (36.4 C), temperature source Oral, resp. rate (!) 24, height 5' 2.5" (1.588 m), weight 89.1 kg, SpO2 91 %.    FiO2 (%):  [50 %-70 %] 70 %   Intake/Output Summary (Last 24 hours) at 01/16/2020 0929 Last data filed at 01/16/2020 0902 Gross per 24 hour  Intake 210 ml  Output 501 ml  Net -291 ml   Filed Weights   01/12/20 0412 01/14/20 0311 01/16/20 0322  Weight: 89 kg 90.6 kg 89.1 kg   Examination: General: Chronically ill appearing elderly female, sitting in chair, no acute distress HEENT: [place]/AT, MM pink/moist, sclera anicteric Neuro: Alert and oriented x3, non-focal  CV: s1s2, irregularly irregular rate and rhythm, no murmur, rubs, or gallops,  PULM: Diminished air movement. Crackles anteriorly. No wheezing.  GI: soft, bowel sounds active in all 4 quadrants, non-tender, non-distended Extremities:  warm/dry, 1+ edema  Skin: no rashes or lesions  Resolved Hospital Problem list     Assessment & Plan:  Acute on chronic hypoxic and hypercapnic respiratory failure  In setting of  COPD and baseline O2 requirement of 2-4L via [place] along with acute on chronic diastolic heart failure and atrial fibrillation with signs volume overload that include lower extremity edema and bilateral pleural effusions. She also has atelectasis based on recent CT chest and radiograph early this morning. Lower extremity duplex US is negative for DVT.    - Will reduce steroid dose to 60mg  IV solumedrol today and transition to 40mg  prednisone daily tomorrow. We will do steroid taper over the next few days as this can be leading to fluid retention. - Continue duoneb and budesonide nebulizer treatments - Asked nurse to bring patient an incentive spirometer to be used throughout the day to reduce atelectasis.   - Continue intermittent bipap throughout the day and at night - Diuresis for volume overload. Recevied lasix 40mg  PO and 500mg  acetazolamide this morning.    Best practice:  Diet: Heart healthy  Pain/Anxiety/Delirium protocol (if indicated): PRN's VAP protocol (if indicated): In palce DVT prophylaxis: Lovenox  GI prophylaxis: PPI Glucose control: SSI Mobility: Up with assistance  Code Status: Full Family Communication: Patient updated at bedside  Disposition: Progressive   Labs   CBC: Recent Labs  Lab 01-29-2020 1722 29-Jan-2020 2007 2020-01-29 2027 01/10/20 0423  WBC 12.8*  --   --  8.4  NEUTROABS 9.3*  --   --   --   HGB 10.5* 12.2 12.6 10.3*  HCT 37.6 36.0 37.0 36.4  MCV 93.3  --   --  92.6  PLT 210  --   --  166    Basic Metabolic Panel: Recent Labs  Lab 01/12/20 0100 01/13/20 0111 01/14/20 0040 01/15/20 0240 01/16/20 0557  NA 143 140 139 140 145  K 3.7 3.5 3.9 3.5 4.0  CL 94* 91* 93* 91* 94*  CO2 35* 39* 35* 36* 37*  GLUCOSE 161* 164* 185* 176* 206*  BUN 22 36* 43* 42* 49*  CREATININE 1.38* 1.45* 1.44* 1.28* 1.41*  CALCIUM 9.9 9.5 9.6 9.7 10.0   GFR: Estimated Creatinine Clearance: 35.6 mL/min (A) (by C-G formula based on SCr of 1.41 mg/dL (H)). Recent Labs  Lab  29-Jan-2020 1722 01/10/20 0423  WBC 12.8* 8.4    Liver Function Tests: Recent Labs  Lab 01/29/20 1722  AST 17  ALT 7  ALKPHOS 46  BILITOT 0.7  PROT 6.7  ALBUMIN 3.7   No results for input(s): LIPASE, AMYLASE in the last 168 hours. No results for input(s): AMMONIA in the last 168 hours.  ABG    Component Value Date/Time   PHART 7.373 01/15/2020 2253   PCO2ART 72.4 (HH) 01/15/2020 2253   PO2ART 53.3 (L) 01/15/2020 2253   HCO3 41.1 (H) 01/15/2020 2253   TCO2 39 (H) 2020-01-29 2027   ACIDBASEDEF 1.6 02/22/2019 0916   O2SAT 85.8 01/15/2020 2253     Coagulation Profile: No results for input(s): INR, PROTIME in the last 168 hours.  Cardiac Enzymes: No results for input(s): CKTOTAL, CKMB, CKMBINDEX, TROPONINI in the last 168 hours.  HbA1C: Hgb A1c MFr Bld  Date/Time Value Ref Range Status  01/10/2020 04:23 AM 6.0 (H) 4.8 - 5.6 % Final    Comment:    (NOTE) Pre diabetes:          5.7%-6.4%  Diabetes:              >  6.4%  Glycemic control for   <7.0% adults with diabetes   05/20/2019 03:18 AM 6.4 (H) 4.8 - 5.6 % Final    Comment:    (NOTE) Pre diabetes:          5.7%-6.4% Diabetes:              >6.4% Glycemic control for   <7.0% adults with diabetes     CBG: Recent Labs  Lab 01/15/20 1125 01/15/20 1647 01/15/20 2123 01/16/20 0600 01/16/20 0843  GLUCAP 184* 189* 186* 189* 207*    Signature:  Melody Comas, MD [facility] Office: [phone]   See Amion for Pager Details

## 2020-01-16 NOTE — Progress Notes (Signed)
Progress Note  Patient Name: Gabrielle [name] Date of Encounter: 01/16/2020  [facility] Cardiologist: Donato Schultz, MD   Subjective  Patient states she does not feel well this morning. Does not like the BiPAP and feels like her breathing is still labored.   Overnight: More hypoxic and lethargic. Initially refused BiPAP, however, amenable later in the day. ABG consistent with chronic hypoxic respiratory failure. CXR with bilateral pleural effusions with R>L with concern for some volume overload.   Negative 901 yesterday after receiving dose of lasix  IV. Cr bumped slightly to 1.4 but within range of values over the past couple of days.  Remains in rate controlled Afib with episodes of RVR overnight  Inpatient Medications    Scheduled Meds: . atorvastatin  20 mg Oral QPM  . budesonide (PULMICORT) nebulizer solution  0.25 mg Nebulization BID  . citalopram  40 mg Oral Daily  . diltiazem  240 mg Oral Daily  . enoxaparin (LOVENOX) injection  40 mg Subcutaneous Q24H  . furosemide  20 mg Oral Daily  . insulin aspart  0-9 Units Subcutaneous TID WC  . ipratropium-albuterol  3 mL Nebulization QID  . mouth rinse  15 mL Mouth Rinse BID  . melatonin  10 mg Oral QHS  . methylPREDNISolone (SOLU-MEDROL) injection  60 mg Intravenous Q12H  . metoprolol tartrate  100 mg Oral BID  . pantoprazole  40 mg Oral Daily  . sodium chloride flush  3 mL Intravenous Q12H  . sucralfate  1 g Oral TID AC   Continuous Infusions:  PRN Meds: acetaminophen **OR** acetaminophen, ALPRAZolam, docusate sodium, sodium chloride   Vital Signs    Vitals:   01/15/20 2058 01/15/20 2211 01/16/20 0011 01/16/20 0322  BP:   (!) 143/97 129/72  Pulse:  88 89 96  Resp:  (!) Temp:   97.6 F (36.4 C) (!) 96.9 F (36.1 C)  TempSrc:   Axillary Axillary  SpO2: 90% (!) 86% (!) 89% (!) 89%  Weight:    89.1 kg  Height:        Intake/Output Summary (Last 24 hours) at 01/16/2020 0735 Last data filed  at 01/16/2020 0413 Gross per 24 hour  Intake 200 ml  Output 1101 ml  Net -901 ml   Last 3 Weights 01/16/2020 01/14/2020 01/12/2020  Weight (lbs) 196 lb 6.9 oz 199 lb 11.8 oz 196 lb 3.4 oz  Weight (kg) 89.1 kg 90.6 kg 89 kg      Telemetry    Afib with episodes of RVR overnight - Personally Reviewed  ECG    No new tracings - Personally Reviewed  Physical Exam   GEN: Sitting in recliner chair with BiPAP on, comfortable. Neck: Difficult to assess due to body habitus Cardiac: Irregularly irregular, no murmurs Respiratory: Diminshed but clear GI: Obese, soft MS: Trace edema. Warm Neuro:  Nonfocal  Psych: Normal affect   Labs    High Sensitivity Troponin:   Recent Labs  Lab 01/06/2020 1722 01/03/2020 2025 01/11/20 0127 01/11/20 1717  TROPONINIHS 18*      Chemistry Recent Labs  Lab 01/22/2020 1722 01/08/2020 2007 01/14/20 0040 01/15/20 0240 01/16/20 0557  NA 137   < > 139 140 145  K 4.9   < > 3.9 3.5 4.0  CL 95*   < > 93* 91* 94*  CO2 34*   < > 35* 36* 37*  GLUCOSE 124*   < > 185* 176* 206*  BUN 16   < >  43* 42* 49*  CREATININE 1.27*   < > 1.44* 1.28* 1.41*  CALCIUM 9.5   < > 9.6 9.7 10.0  PROT 6.7  --   --   --   --   ALBUMIN 3.7  --   --   --   --   AST 17  --   --   --   --   ALT 7  --   --   --   --   ALKPHOS 46  --   --   --   --   BILITOT 0.7  --   --   --   --   GFRNONAA 44*   < > 38* 43* 39*  ANIONGAP 8   < > 11 13 14    < > = values in this interval not displayed.     Hematology Recent Labs  Lab 01/20/2020 1722 01/28/2020 1722 01/21/2020 2007 01/13/2020 2027 01/10/20 0423  WBC 12.8*  --   --   --  8.4  RBC 4.03  --   --   --  3.93  HGB 10.5*   < > 12.2 12.6 10.3*  HCT 37.6   < > 36.0 37.0 36.4  MCV 93.3  --   --   --  92.6  MCH 26.1  --   --   --  26.2  MCHC 27.9*  --   --   --  28.3*  RDW 15.5  --   --   --  15.5  PLT 210  --   --   --  166   < > = values in this interval not displayed.    BNP Recent Labs  Lab 01/01/2020 1722  BNP  471.2*     DDimer No results for input(s): DDIMER in the last 168 hours.   Radiology    DG CHEST PORT 1 VIEW  Result Date: 01/16/2020 CLINICAL DATA:  Hypoxia EXAM: PORTABLE CHEST 1 VIEW COMPARISON:  Radiograph 01/05/2020, CT 01/13/2020 FINDINGS: Bilateral effusions are present, right greater than left. These may be slightly increased from the comparison CT 01/13/2020. The adjacent opacity which likely reflect a degree of passive atelectasis though underlying airspace disease is difficult to exclude. Additional septal and fissural thickening and hazy interstitial opacity with indistinct vascularity/vascular congestion. More bandlike areas of chronic scarring and architectural distortion are noted in the lungs bilaterally as well. Pacer pack overlies left chest wall with leads in stable position directed towards the cardiac apex and right atrium. Postsurgical changes related to prior CABG including intact and aligned sternotomy wires and multiple surgical clips projecting over the mediastinum. The aorta is calcified. The remaining cardiomediastinal contours are unremarkable. No acute osseous or soft tissue abnormality. Degenerative changes are present in the imaged spine and shoulders. IMPRESSION: 1. Bilateral effusions, right greater than left. These may be slightly increased from the comparison CT 01/13/2020. 2. Adjacent opacity is likely some passive atelectatic changes of underlying infection is difficult to exclude. 3. Additional background features suggest CHF/volume overload with vascular congestion and cardiomegaly. Electronically Signed   By: Kreg ShropshirePrice  DeHay M.D.   On: 01/16/2020 00:24   VAS US LOWER EXTREMITY VENOUS (DVT)  Result Date: 01/15/2020  Lower Venous DVT Study Indications: Dyspnea, hypoxia.  Limitations: Body habitus and poor ultrasound/tissue interface. Comparison Study: No prior study Performing Technologist: Gertie FeyMichelle Simonetti MHA, RDMS, RVT, RDCS  Examination Guidelines: A complete  evaluation includes B-mode imaging, spectral Doppler, color Doppler, and power Doppler as needed of all  accessible portions of each vessel. Bilateral testing is considered an integral part of a complete examination. Limited examinations for reoccurring indications may be performed as noted. The reflux portion of the exam is performed with the patient in reverse Trendelenburg.  +---------+---------------+---------+-----------+----------+--------------+ RIGHT    CompressibilityPhasicitySpontaneityPropertiesThrombus Aging +---------+---------------+---------+-----------+----------+--------------+ CFV      Full           No       Yes                                 +---------+---------------+---------+-----------+----------+--------------+ SFJ      Full                                                        +---------+---------------+---------+-----------+----------+--------------+ FV Prox  Full                                                        +---------+---------------+---------+-----------+----------+--------------+ FV Mid   Full                                                        +---------+---------------+---------+-----------+----------+--------------+ FV DistalFull                                                        +---------+---------------+---------+-----------+----------+--------------+ PFV      Full                                                        +---------+---------------+---------+-----------+----------+--------------+ POP      Full           No       Yes                                 +---------+---------------+---------+-----------+----------+--------------+ PTV      Full                                                        +---------+---------------+---------+-----------+----------+--------------+ PERO     Full                                                         +---------+---------------+---------+-----------+----------+--------------+ Limited evaluation of calf  veins.  +---------+---------------+---------+-----------+----------+--------------+ LEFT     CompressibilityPhasicitySpontaneityPropertiesThrombus Aging +---------+---------------+---------+-----------+----------+--------------+ CFV      Full           No       Yes                                 +---------+---------------+---------+-----------+----------+--------------+ SFJ      Full                                                        +---------+---------------+---------+-----------+----------+--------------+ FV Prox  Full                                                        +---------+---------------+---------+-----------+----------+--------------+ FV Mid   Full                                                        +---------+---------------+---------+-----------+----------+--------------+ FV DistalFull                                                        +---------+---------------+---------+-----------+----------+--------------+ PFV      Full                                                        +---------+---------------+---------+-----------+----------+--------------+ POP      Full           No       Yes                                 +---------+---------------+---------+-----------+----------+--------------+ PTV      Full                                                        +---------+---------------+---------+-----------+----------+--------------+ PERO     Full                                                        +---------+---------------+---------+-----------+----------+--------------+ Limited evaluation of calf veins    Summary: RIGHT: - There is no evidence of deep vein thrombosis in the lower extremity. However, portions of this examination were limited- see technologist comments above.  - No cystic structure found in  the  popliteal fossa.  LEFT: - There is no evidence of deep vein thrombosis in the lower extremity. However, portions of this examination were limited- see technologist comments above.  - No cystic structure found in the popliteal fossa.  Bilateral lower extremity venous flow is pulsatile, suggestive of possibly elevated right heart pressure.  *See table(s) above for measurements and observations. Electronically signed by Lemar Livings MD on 01/15/2020 at 4:29:34 PM.    Final     Cardiac Studies   Echo 01/10/20 1. Left ventricular ejection fraction, by estimation, is 55 to 60%. The  left ventricle has normal function. The left ventricle has no regional  wall motion abnormalities. Left ventricular diastolic parameters are  indeterminate.  2. Right ventricular systolic function is normal. The right ventricular  size is normal.  3. Left atrial size was mildly dilated.  4. The mitral valve is normal in structure. Mild mitral valve  regurgitation. No evidence of mitral stenosis. Severe mitral annular  calcification.  5. The aortic valve is normal in structure. Aortic valve regurgitation is  not visualized. No aortic stenosis is present.  6. The inferior vena cava is dilated in size with <50% respiratory  variability, suggesting right atrial pressure of 15 mmHg.   Pacemaker download 01/14/2020: Normal device function. In AFib persistently since November 21, 2019 (overall burden 41% in last 12 months). Occasional atrial undersensing of fib waves. Throughout most of this period, ventricular rate control has been fair, except for last 1-2 weeks. RV pacing only 14%.  Patient Profile     74 y.o. female with COPD on home O2, persistent Afib, HTN, CKDIII, history of GIB, and chronic diastolic heart failure who presented with acute on chronic respiratory failure thought to be multifactorial in the setting of COPD and acute on chronic diastolic heart failure exacerbation for which we are now  consulted.  Assessment & Plan    #Acute on chronic diastolic heart failure: Baseline weight 196-199lbs. CXR with pulmonary congestion and bilateral pleural effusions concerning for volume overload which may be contributing to worsening hypoxia yesterday.  -Given CXR findings as well as worsening hypoxia overnight; will do trial of diamox today (PO given due to national shortage of IV) -Consider additional dose of diuretic this afternoon (diamox vs lasix) pending UOP -Near baseline weight currently but may need to determine new dry weight -Will need to watch Cr closely with diuresis -Monitor I/Os and daily weights  #Persistent Afib #SSS s/p PPM placement: Last PPM interrogation revealed that patient in uninterrupted Afib since 11/2019 with fairly well controlled rates until days prior to admission. -Not on [facility] due to history of GIB and therefore not candidate for DCCV -If remains symptomatic from Afib, may do trial of AC to see how tolerates -Continue dilt and metop for rate control  #CAD s/p ACB: No chest pain and trops negative. TTE with preserved EF and no regional WMA. -Continue statin -Not on ASA due to GIB  #Acute on Chronic respiratory Failure #COPD O2 requirements increased yesterday requiring BiPAP.   -On steroids and nebs for COPD exacerbation -Management per primary team and pulm     For questions or updates, please contact [facility] Please consult [email or web address] for contact info under        Signed, Meriam Sprague, MD  01/16/2020, 7:35 AM

## 2020-01-16 NOTE — Progress Notes (Signed)
[facility]  PROGRESS NOTE  Gabrielle [name] GGY:[id number] DOB: [birth year] DOA: 01/22/2020 PCP: Henrine Screws, MD   Brief HPI:   74 year old female with history of COPD, HFpEF, atrial fibrillation, anemia, QT prolongation, CAD s/p MI and CABG x7, hypertension, tachybradycardia syndrome status post pacemaker presented with progressive dyspnea patient usually is on 2 L of oxygen at home for COPD.  And was requiring 5 L of oxygen.  ABG showed PCO2 of 71, pH 7.32. BNP was elevated at 471, patient started on IV Lasix.   Subjective   Patient seen and examined, currently on BiPAP.   Assessment/Plan:     1. Acute hypercapnic respiratory failure-patient has underlying COPD and is on 2 L of oxygen at home chronically.  She presented with COPD exacerbation, received Solu-Medrol in the ED. started on DuoNeb nebulizer every 6 hours, Solu-Medrol 60 mg IV every 6 hours.  Solu-Medrol was switched to 60 mg IV every 12 hours.  Continue to wean off oxygen as tolerated.  CT chest high-resolution was obtained yesterday to look for underlying pulmonary fibrosis. CT chest did not show ILD however showed dilated pulmonary artery trunk consistent with pulmonary artery hypertension. Pulmonology has seen the patient, patient is currently on BiPAP. 2. Diarrhea-patient had diarrhea, which is resolved.  Stool for C. difficile PCR was negative.  GI pathogen panel obtained was also negative. 3. ? Acute on chronic diastolic CHF-echocardiogram from 2020 showed grade 2 diastolic dysfunction, EF 50 to 55%.  Patient was started on Lasix 40 mg IV every 12 hours for 1 day and then transition to Lasix 40 mg daily.  She appears volume overloaded yesterday so she was started back on Lasix 40 mg IV every 12 hours, however she had very minimal urine output.  Serum creatinine is rising.  I doubt that patient has acute diastolic CHF.  Echocardiogram done on 01/10/2020 again showed indeterminate diastolic dysfunction, EF is 55 to  60%.  Cardiology was consulted, and agree that patient does not appear to be in acute diastolic CHF.  IV Lasix was discontinued, pulmonology has started patient on p.o. Lasix and Diamox.   4. Acute kidney injury on CKD stage III-patient baseline creatinine is around 1.2-1.3.  She came with creatinine 1.15, which  worsened to 1.45 with diuresis, BUN  up to 43.  Lasix was  discontinued.  Cardiology saw the patient and recommended that she does not need any more diuresis. Creatinine is again up after starting p.o. Lasix as per pulmonology as above.  Follow BMP in am. 5. Chronic atrial fibrillation-patient is not on anticoagulantion due to history of GI bleed, continue diltiazem 120 mg daily, metoprolol dose has been changed to 100 mg p.o. twice daily.  Heart rate is controlled. 6. Hypertension-continue metoprolol, diltiazem. 7. Hyperglycemia due to steroids -patient's hemoglobin A1c was 6.4 in March 2021.  She is currently on Solu-Medrol, will start sliding scale insulin NovoLog.  Hemoglobin A1c as of 01/10/2020 is 6.0. 8. Tachybradycardia syndrome s/p pacemaker placement-patient has pacemaker in place, she is having episodes of bradycardia.  Cardiology was consulted, EP reviewed EKG and telemetry strips.  Pacemaker is functioning fine as per cardiology.     COVID-19 Labs  No results for input(s): DDIMER, FERRITIN, LDH, CRP in the last 72 hours.  Lab Results  Component Value Date   SARSCOV2NAA NEGATIVE 01/12/2020   SARSCOV2NAA NEGATIVE 05/16/2019   SARSCOV2NAA NEGATIVE 02/21/2019   SARSCOV2NAA Not Detected 02/08/2019     Scheduled medications:   . atorvastatin  20 mg  Oral QPM  . budesonide (PULMICORT) nebulizer solution  0.25 mg Nebulization BID  . citalopram  40 mg Oral Daily  . diltiazem  240 mg Oral Daily  . enoxaparin (LOVENOX) injection  40 mg Subcutaneous Q24H  . furosemide  20 mg Oral Daily  . insulin aspart  0-9 Units Subcutaneous TID WC  . ipratropium-albuterol  3 mL Nebulization  TID  . mouth rinse  15 mL Mouth Rinse BID  . melatonin  10 mg Oral QHS  . metoprolol tartrate  100 mg Oral BID  . pantoprazole  40 mg Oral Daily  . [START ON 01/20/2020] predniSONE  20 mg Oral Q breakfast  . [START ON 01/17/2020] predniSONE  40 mg Oral Q breakfast  . sodium chloride flush  3 mL Intravenous Q12H  . sucralfate  1 g Oral TID AC         CBG: Recent Labs  Lab 01/15/20 1647 01/15/20 2123 01/16/20 0600 01/16/20 0843 01/16/20 1116  GLUCAP 189* 186* 189* 207* 263*    SpO2: (!) 89 % O2 Flow Rate (L/min): 15 L/min FiO2 (%): 70 %    CBC: Recent Labs  Lab 2020-01-26 1722 26-Jan-2020 2007 26-Jan-2020 2027 01/10/20 0423  WBC 12.8*  --   --  8.4  NEUTROABS 9.3*  --   --   --   HGB 10.5* 12.2 12.6 10.3*  HCT 37.6 36.0 37.0 36.4  MCV 93.3  --   --  92.6  PLT 210  --   --  166    Basic Metabolic Panel: Recent Labs  Lab 01/12/20 0100 01/13/20 0111 01/14/20 0040 01/15/20 0240 01/16/20 0557  NA 143 140 139 140 145  K 3.7 3.5 3.9 3.5 4.0  CL 94* 91* 93* 91* 94*  CO2 35* 39* 35* 36* 37*  GLUCOSE 161* 164* 185* 176* 206*  BUN 22 36* 43* 42* 49*  CREATININE 1.38* 1.45* 1.44* 1.28* 1.41*  CALCIUM 9.9 9.5 9.6 9.7 10.0     Liver Function Tests: Recent Labs  Lab 01/26/2020 1722  AST 17  ALT 7  ALKPHOS 46  BILITOT 0.7  PROT 6.7  ALBUMIN 3.7     Antibiotics: Anti-infectives (From admission, onward)   None       DVT prophylaxis: Lovenox  Code Status: Full code  Family Communication: No family at bedside   Consultants:  Cardiology  Pulmonology  Procedures:      Objective   Vitals:   01/16/20 0827 01/16/20 0859 01/16/20 1023 01/16/20 1118  BP:    128/85  Pulse: 87 95  99  Resp: (!) 24   (!) 22  Temp:    97.6 F (36.4 C)  TempSrc:    Oral  SpO2: 91%  (!) 89%   Weight:      Height:        Intake/Output Summary (Last 24 hours) at 01/16/2020 1217 Last data filed at 01/16/2020 0902 Gross per 24 hour  Intake 10 ml  Output 501 ml   Net -491 ml    11/14 1901 - 11/16 0700 In: 200 [P.O.:200] Out: 1601 [Urine:1601]  Filed Weights   01/12/20 0412 01/14/20 0311 01/16/20 0322  Weight: 89 kg 90.6 kg 89.1 kg    Physical Examination:  General-appears in no acute distress Heart-S1-S2, regular, no murmur auscultated Lungs-decreased breath sounds bilaterally Abdomen-soft, nontender, no organomegaly Extremities-no edema in the lower extremities Neuro-alert, oriented x3, no focal deficit noted  Status is: Inpatient  Dispo: The patient is from: Home  Anticipated d/c is to: Home              Anticipated d/c date is: 01/20/2020              Patient currently not medically stable for discharge  Barrier to discharge-ongoing treatment for acute hypercapnic respiratory failure       Data Reviewed:   Recent Results (from the past 240 hour(s))  Respiratory Panel by RT PCR (Flu A&B, Covid) - Nasopharyngeal Swab     Status: None   Collection Time: 2019/11/17  9:12 PM   Specimen: Nasopharyngeal Swab  Result Value Ref Range Status   SARS Coronavirus 2 by RT PCR NEGATIVE NEGATIVE Final    Comment: (NOTE) SARS-CoV-2 target nucleic acids are NOT DETECTED.  The SARS-CoV-2 RNA is generally detectable in upper respiratoy specimens during the acute phase of infection. The lowest concentration of SARS-CoV-2 viral copies this assay can detect is 131 copies/mL. A negative result does not preclude SARS-Cov-2 infection and should not be used as the sole basis for treatment or other patient management decisions. A negative result may occur with  improper specimen collection/handling, submission of specimen other than nasopharyngeal swab, presence of viral mutation(s) within the areas targeted by this assay, and inadequate number of viral copies (<131 copies/mL). A negative result must be combined with clinical observations, patient history, and epidemiological information. The expected result is Negative.  Fact  Sheet for Patients:  https://www.moore.com/https://www.fda.gov/media/142436/download  Fact Sheet for Healthcare Providers:  https://www.young.biz/https://www.fda.gov/media/142435/download  This test is no t yet approved or cleared by the [place] FDA and  has been authorized for detection and/or diagnosis of SARS-CoV-2 by FDA under an Emergency Use Authorization (EUA). This EUA will remain  in effect (meaning this test can be used) for the duration of the COVID-19 declaration under Section 564(b)(1) of the Act, 21 U.S.C. section 360bbb-3(b)(1), unless the authorization is terminated or revoked sooner.     Influenza A by PCR NEGATIVE NEGATIVE Final   Influenza B by PCR NEGATIVE NEGATIVE Final    Comment: (NOTE) The Xpert Xpress SARS-CoV-2/FLU/RSV assay is intended as an aid in  the diagnosis of influenza from Nasopharyngeal swab specimens and  should not be used as a sole basis for treatment. Nasal washings and  aspirates are unacceptable for Xpert Xpress SARS-CoV-2/FLU/RSV  testing.  Fact Sheet for Patients: https://www.moore.com/https://www.fda.gov/media/142436/download  Fact Sheet for Healthcare Providers: https://www.young.biz/https://www.fda.gov/media/142435/download  This test is not yet approved or cleared by the [place] FDA and  has been authorized for detection and/or diagnosis of SARS-CoV-2 by  FDA under an Emergency Use Authorization (EUA). This EUA will remain  in effect (meaning this test can be used) for the duration of the  Covid-19 declaration under Section 564(b)(1) of the Act, 21  U.S.C. section 360bbb-3(b)(1), unless the authorization is  terminated or revoked. Performed at [facility], [address] [place], [place] [address]   MRSA PCR Screening     Status: None   Collection Time: 01/10/20 12:58 AM   Specimen: Nasal Mucosa; Nasopharyngeal  Result Value Ref Range Status   MRSA by PCR NEGATIVE NEGATIVE Final    Comment:        The GeneXpert MRSA Assay (FDA approved for NASAL specimens only), is one component of  a comprehensive MRSA colonization surveillance program. It is not intended to diagnose MRSA infection nor to guide or monitor treatment for MRSA infections. Performed at [facility], [address] [place], [place] [address]  C Difficile Quick Screen w PCR reflex     Status: None   Collection Time: 01/12/20  5:02 AM   Specimen: STOOL  Result Value Ref Range Status   C Diff antigen NEGATIVE NEGATIVE Final   C Diff toxin NEGATIVE NEGATIVE Final   C Diff interpretation No C. difficile detected.  Final    Comment: Performed at [facility], [address], [place], [place] [address]  Gastrointestinal Panel by PCR , Stool     Status: None   Collection Time: 01/12/20  5:02 AM   Specimen: STOOL  Result Value Ref Range Status   Campylobacter species NOT DETECTED NOT DETECTED Final   Plesimonas shigelloides NOT DETECTED NOT DETECTED Final   Salmonella species NOT DETECTED NOT DETECTED Final   Yersinia enterocolitica NOT DETECTED NOT DETECTED Final   Vibrio species NOT DETECTED NOT DETECTED Final   Vibrio cholerae NOT DETECTED NOT DETECTED Final   Enteroaggregative E coli (EAEC) NOT DETECTED NOT DETECTED Final   Enteropathogenic E coli (EPEC) NOT DETECTED NOT DETECTED Final   Enterotoxigenic E coli (ETEC) NOT DETECTED NOT DETECTED Final   Shiga like toxin producing E coli (STEC) NOT DETECTED NOT DETECTED Final   Shigella/Enteroinvasive E coli (EIEC) NOT DETECTED NOT DETECTED Final   Cryptosporidium NOT DETECTED NOT DETECTED Final   Cyclospora cayetanensis NOT DETECTED NOT DETECTED Final   Entamoeba histolytica NOT DETECTED NOT DETECTED Final   Giardia lamblia NOT DETECTED NOT DETECTED Final   Adenovirus F40/41 NOT DETECTED NOT DETECTED Final   Astrovirus NOT DETECTED NOT DETECTED Final   Norovirus GI/GII NOT DETECTED NOT DETECTED Final   Rotavirus A NOT DETECTED NOT DETECTED Final   Sapovirus (I, II, IV, and V) NOT DETECTED NOT DETECTED Final    Comment: Performed at  [facility], [address] [place], [place] [address]    BNP (last 3 results) Recent Labs    02/21/19 2213 05/16/19 1840 01/03/2020 1722  BNP 484.5* 1,338.5* 471.2*      Devonn Giampietro S Loda Bialas   [facility] If 7PM-7AM, please contact night-coverage at [email or web address], Office  [phone]   01/16/2020, 12:17 PM  LOS: 6 days

## 2020-01-16 NOTE — Plan of Care (Signed)

## 2020-01-17 ENCOUNTER — Inpatient Hospital Stay (HOSPITAL_COMMUNITY): Payer: Medicare Other

## 2020-01-17 DIAGNOSIS — R6510 Systemic inflammatory response syndrome (SIRS) of non-infectious origin without acute organ dysfunction: Secondary | ICD-10-CM

## 2020-01-17 DIAGNOSIS — I5033 Acute on chronic diastolic (congestive) heart failure: Secondary | ICD-10-CM | POA: Diagnosis not present

## 2020-01-17 DIAGNOSIS — I509 Heart failure, unspecified: Secondary | ICD-10-CM | POA: Diagnosis not present

## 2020-01-17 DIAGNOSIS — J9621 Acute and chronic respiratory failure with hypoxia: Secondary | ICD-10-CM | POA: Diagnosis not present

## 2020-01-17 LAB — COMPREHENSIVE METABOLIC PANEL
ALT: 20 U/L (ref 0–44)
AST: 27 U/L (ref 15–41)
Albumin: 4.1 g/dL (ref 3.5–5.0)
Alkaline Phosphatase: 42 U/L (ref 38–126)
Anion gap: 9 (ref 5–15)
BUN: 48 mg/dL — ABNORMAL HIGH (ref 8–23)
CO2: 39 mmol/L — ABNORMAL HIGH (ref 22–32)
Calcium: 9.8 mg/dL (ref 8.9–10.3)
Chloride: 94 mmol/L — ABNORMAL LOW (ref 98–111)
Creatinine, Ser: 1.19 mg/dL — ABNORMAL HIGH (ref 0.44–1.00)
GFR, Estimated: 47 mL/min — ABNORMAL LOW (ref >60)
Glucose, Bld: 169 mg/dL — ABNORMAL HIGH (ref 70–99)
Potassium: 3 mmol/L — ABNORMAL LOW (ref 3.5–5.1)
Sodium: 142 mmol/L (ref 135–145)
Total Bilirubin: 0.9 mg/dL (ref 0.3–1.2)
Total Protein: 6.5 g/dL (ref 6.5–8.1)

## 2020-01-17 LAB — CBC WITH DIFFERENTIAL/PLATELET
Abs Immature Granulocytes: 0.15 10*3/uL — ABNORMAL HIGH (ref 0.00–0.07)
Basophils Absolute: 0 10*3/uL (ref 0.0–0.1)
Basophils Relative: 0 %
Eosinophils Absolute: 0 10*3/uL (ref 0.0–0.5)
Eosinophils Relative: 0 %
HCT: 34.7 % — ABNORMAL LOW (ref 36.0–46.0)
Hemoglobin: 9.7 g/dL — ABNORMAL LOW (ref 12.0–15.0)
Immature Granulocytes: 1 %
Lymphocytes Relative: 4 %
Lymphs Abs: 0.5 10*3/uL — ABNORMAL LOW (ref 0.7–4.0)
MCH: 25.3 pg — ABNORMAL LOW (ref 26.0–34.0)
MCHC: 28 g/dL — ABNORMAL LOW (ref 30.0–36.0)
MCV: 90.6 fL (ref 80.0–100.0)
Monocytes Absolute: 0.7 10*3/uL (ref 0.1–1.0)
Monocytes Relative: 6 %
Neutro Abs: 11 10*3/uL — ABNORMAL HIGH (ref 1.7–7.7)
Neutrophils Relative %: 89 %
Platelets: 117 10*3/uL — ABNORMAL LOW (ref 150–400)
RBC: 3.83 MIL/uL — ABNORMAL LOW (ref 3.87–5.11)
RDW: 15 % (ref 11.5–15.5)
WBC: 12.3 10*3/uL — ABNORMAL HIGH (ref 4.0–10.5)
nRBC: 0 % (ref 0.0–0.2)

## 2020-01-17 LAB — HEPATIC FUNCTION PANEL
ALT: 18 U/L (ref 0–44)
AST: 29 U/L (ref 15–41)
Albumin: 3.7 g/dL (ref 3.5–5.0)
Alkaline Phosphatase: 41 U/L (ref 38–126)
Bilirubin, Direct: 0.2 mg/dL (ref 0.0–0.2)
Indirect Bilirubin: 0.5 mg/dL (ref 0.3–0.9)
Total Bilirubin: 0.7 mg/dL (ref 0.3–1.2)
Total Protein: 6.4 g/dL — ABNORMAL LOW (ref 6.5–8.1)

## 2020-01-17 LAB — BASIC METABOLIC PANEL
Anion gap: 12 (ref 5–15)
Anion gap: 9 (ref 5–15)
BUN: 51 mg/dL — ABNORMAL HIGH (ref 8–23)
BUN: 48 mg/dL — ABNORMAL HIGH (ref 8–23)
CO2: 36 mmol/L — ABNORMAL HIGH (ref 22–32)
CO2: 35 mmol/L — ABNORMAL HIGH (ref 22–32)
Calcium: 10 mg/dL (ref 8.9–10.3)
Calcium: 9.8 mg/dL (ref 8.9–10.3)
Chloride: 93 mmol/L — ABNORMAL LOW (ref 98–111)
Chloride: 95 mmol/L — ABNORMAL LOW (ref 98–111)
Creatinine, Ser: 1.29 mg/dL — ABNORMAL HIGH (ref 0.44–1.00)
Creatinine, Ser: 1.28 mg/dL — ABNORMAL HIGH (ref 0.44–1.00)
GFR, Estimated: 43 mL/min — ABNORMAL LOW (ref >60)
GFR, Estimated: 43 mL/min — ABNORMAL LOW (ref >60)
Glucose, Bld: 161 mg/dL — ABNORMAL HIGH (ref 70–99)
Glucose, Bld: 271 mg/dL — ABNORMAL HIGH (ref 70–99)
Potassium: 3.6 mmol/L (ref 3.5–5.1)
Potassium: 3.3 mmol/L — ABNORMAL LOW (ref 3.5–5.1)
Sodium: 141 mmol/L (ref 135–145)
Sodium: 139 mmol/L (ref 135–145)

## 2020-01-17 LAB — GLUCOSE, CAPILLARY
Glucose-Capillary: 183 mg/dL — ABNORMAL HIGH (ref 70–99)
Glucose-Capillary: 226 mg/dL — ABNORMAL HIGH (ref 70–99)
Glucose-Capillary: 256 mg/dL — ABNORMAL HIGH (ref 70–99)
Glucose-Capillary: 166 mg/dL — ABNORMAL HIGH (ref 70–99)

## 2020-01-17 LAB — LIPASE, BLOOD: Lipase: 32 U/L (ref 11–51)

## 2020-01-17 LAB — MAGNESIUM
Magnesium: 2 mg/dL (ref 1.7–2.4)
Magnesium: 1.9 mg/dL (ref 1.7–2.4)

## 2020-01-17 LAB — CORTISOL: Cortisol, Plasma: 7.6 ug/dL

## 2020-01-17 LAB — TSH: TSH: 0.731 u[IU]/mL (ref 0.350–4.500)

## 2020-01-17 MED ORDER — MAGNESIUM SULFATE 2 GM/50ML IV SOLN
2.0000 g | Freq: Once | INTRAVENOUS | Status: AC
  Administered 2020-01-17: 2 g via INTRAVENOUS
  Filled 2020-01-17: qty 50

## 2020-01-17 MED ORDER — FUROSEMIDE 10 MG/ML IJ SOLN: 60.0000 mg | Freq: Once | INTRAMUSCULAR | Status: DC

## 2020-01-17 MED ORDER — FUROSEMIDE 10 MG/ML IJ SOLN
40.0000 mg | Freq: Two times a day (BID) | INTRAMUSCULAR | Status: DC
  Administered 2020-01-17 (×2): 40 mg via INTRAVENOUS
  Filled 2020-01-17 (×2): qty 4

## 2020-01-17 MED ORDER — POTASSIUM CHLORIDE 20 MEQ PO PACK
40.0000 meq | PACK | Freq: Once | ORAL | Status: AC
  Administered 2020-01-17: 40 meq via ORAL
  Filled 2020-01-17: qty 2

## 2020-01-17 MED ORDER — PREDNISONE 20 MG PO TABS: 20.0000 mg | ORAL_TABLET | Freq: Every day | ORAL | Status: DC

## 2020-01-17 MED ORDER — ACETAZOLAMIDE 250 MG PO TABS
500.0000 mg | ORAL_TABLET | Freq: Once | ORAL | Status: AC
  Administered 2020-01-17: 500 mg via ORAL
  Filled 2020-01-17: qty 2

## 2020-01-17 MED ORDER — ALBUMIN HUMAN 5 % IV SOLN
25.0000 g | Freq: Two times a day (BID) | INTRAVENOUS | Status: AC
  Administered 2020-01-17: 12.5 g via INTRAVENOUS
  Administered 2020-01-17: 25 g via INTRAVENOUS
  Filled 2020-01-17 (×2): qty 500

## 2020-01-17 MED ORDER — PREDNISONE 10 MG PO TABS: 10.0000 mg | ORAL_TABLET | Freq: Every day | ORAL | Status: DC

## 2020-01-17 NOTE — Plan of Care (Signed)
  Problem: Activity: Goal: Risk for activity intolerance will decrease Outcome: Progressing   Problem: Nutrition: Goal: Adequate nutrition will be maintained Outcome: Progressing   Problem: Coping: Goal: Level of anxiety will decrease Outcome: Progressing   Problem: Pain Managment: Goal: General experience of comfort will improve Outcome: Progressing   Problem: Skin Integrity: Goal: Risk for impaired skin integrity will decrease Outcome: Progressing   

## 2020-01-17 NOTE — Progress Notes (Signed)
Patient had rectal temp of 35.6 C, vitals otherwise normal. She is not hypoglycemic and cortisol, TSH, and lipase are normal. She has new leukocytosis -> will culture and look for infectious etiology with CXR and UA.

## 2020-01-17 NOTE — Progress Notes (Signed)
NAME:  Gabrielle [name], MRN:  [id number], DOB:  [birth year], LOS: 7 ADMISSION DATE:  01/02/2020, CONSULTATION DATE:  01/15/2020 REFERRING MD: Dr. Sharl Ma, CHIEF COMPLAINT: Acute hypercapnic respiratory failure  Brief History   74 year old female presented 11/9 for progressive dyspnea.  Known history of hypoxic respiratory failure on chronic supplemental oxygen of 2 to 4 L, COPD, and current everyday smoker.  History of present illness   [name] is a 74 year old female with a past medical history significant for chronic hypoxic and hypercapnic respiratory failure requiring 2 to 4 L supplemental oxygen at baseline, COPD, current everyday smoker,HFpEF, atrial fibrillation, anemia, CAD status post MI and CABG x7, hypertension, tachybradycardia syndrome requiring placement of pacemaker, and anemia who presented to the emergency department 11/9 for complaints of progressive dyspnea.  Patient reports dyspnea has progressively been worsening over the last 3 weeks.  She reports despite use of supplemental oxygen and home nebulizers shortness of breath has progressively worsened.  Patient also reported weight increase prior to admission, orthopnea, and recent diarrhea.    On arrival to emergency department she was seen hypoxic with a saturation of 86% on 2 L with other vital signs within normal limits.  Lab work significant for bicarb 34 creatinine 1.27, and BNP 471  During admission patient has been treated for acute COPD exasperation and questionable diastolic congestive heart failure exasperation.  She has received increased supplemental oxygen, bronchodilators, and IV steroids with little improvement in dyspnea.  Underwent high-resolution CT scan chest to look for underlying ILD, this was not identified. PCCM consulted for further assistance in management.  Past Medical History  HTN HLD MI with CAD and CABG x7 COPD  Chronic hypoxic and hypercapnic respiratory failure  Chronic  A-fib ARF HFpEF  Significant Hospital Events   Admitted 11/9  Consults:  Cardiology  Pulmonary   Procedures:    Significant Diagnostic Tests:  High resolution CT chest 11/15 > several areas of scaring likely related to prior infection, no ILD. Pulmonary artery hypertension.   ECHO 11/10 > EF 55-60% with no observed diastolic dysfunction   Micro Data:  COVID 11/09 > negative  MRSA PCR 11/12 > negative  C-diff 11/12 > negative  GI panel 11/12 > negative   Antimicrobials:    Interim history/subjective:  Net negative since yesterday. Cr 1.29 today. Patient requesting to go back on Bipap this morning. Sitting up in chair eating breakfast.  Objective   Blood pressure (!) 132/98, pulse 74, temperature (!) 97.4 F (36.3 C), temperature source Oral, resp. rate 18, height 5' 2.5" (1.588 m), weight 90 kg, SpO2 (!) 89 %.    FiO2 (%):  [70 %] 70 %   Intake/Output Summary (Last 24 hours) at 01/17/2020 0804 Last data filed at 01/17/2020 0445 Gross per 24 hour  Intake 513 ml  Output 850 ml  Net -337 ml   Filed Weights   01/14/20 0311 01/16/20 0322 01/17/20 0439  Weight: 90.6 kg 89.1 kg 90 kg   Examination: General: Chronically ill appearing elderly female, sitting in chair, somnolent HEENT: [place]/AT, MM pink/moist, sclera anicteric Neuro: Alert and oriented x3, non-focal, somnolent  CV: s1s2, irregularly irregular rate and rhythm, no murmur, rubs, or gallops,  PULM: Diminished bilateral air movement posteriorly. No wheezing.  GI: soft, bowel sounds active in all 4 quadrants, non-tender, non-distended Extremities: warm/dry, 1+ pitting edema bilaterally to distal shins  Skin: no rashes or lesions  Resolved Hospital Problem list     Assessment & Plan:  Acute on  chronic hypoxic and hypercapnic respiratory failure  In setting of COPD and baseline O2 requirement of 2-4L via [place] along with acute on chronic diastolic heart failure and atrial fibrillation with signs volume  overload that include lower extremity edema and bilateral pleural effusions. She also has atelectasis based on recent CT chest and radiograph from yesterday. Lower extremity duplex US is negative for DVT.    - Diuresis for volume overload. Will do albumin and lasix 40mg  BID today. - Repeat BMP and Mg this afternoon - Will consider thoracentesis later this week if not able to remove fluid via diuresis - Steroid taper: 40mg  daily for 2 days, then 20mg  daily for 2 days, 10mg  for 2 days then off. - Continue duoneb and budesonide nebulizer treatments - Continue incentive spirometer to be used throughout the day to reduce atelectasis.   - Continue intermittent bipap throughout the day and at night  Best practice:  Diet: Heart healthy  Pain/Anxiety/Delirium protocol (if indicated): PRN's VAP protocol (if indicated): n/a DVT prophylaxis: Lovenox  GI prophylaxis: PPI Glucose control: SSI Mobility: Up with assistance  Code Status: Full Family Communication: Patient updated at bedside  Disposition: Progressive   Labs   CBC: No results for input(s): WBC, NEUTROABS, HGB, HCT, MCV, PLT in the last 168 hours.  Basic Metabolic Panel: Recent Labs  Lab 01/13/20 0111 01/14/20 0040 01/15/20 0240 01/16/20 0557 01/17/20 0113  NA 140 139 140 145 141  K 3.5 3.9 3.5 4.0 3.6  CL 91* 93* 91* 94* 93*  CO2 39* 35* 36* 37* 36*  GLUCOSE 164* 185* 176* 206* 161*  BUN 36* 43* 42* 49* 51*  CREATININE 1.45* 1.44* 1.28* 1.41* 1.29*  CALCIUM 9.5 9.6 9.7 10.0 10.0   GFR: Estimated Creatinine Clearance: 39.1 mL/min (A) (by C-G formula based on SCr of 1.29 mg/dL (H)). No results for input(s): PROCALCITON, WBC, LATICACIDVEN in the last 168 hours.  Liver Function Tests: No results for input(s): AST, ALT, ALKPHOS, BILITOT, PROT, ALBUMIN in the last 168 hours. No results for input(s): LIPASE, AMYLASE in the last 168 hours. No results for input(s): AMMONIA in the last 168 hours.  ABG    Component Value  Date/Time   PHART 7.373 01/15/2020 2253   PCO2ART 72.4 (HH) 01/15/2020 2253   PO2ART 53.3 (L) 01/15/2020 2253   HCO3 41.1 (H) 01/15/2020 2253   TCO2 39 (H) 01/27/2020 2027   ACIDBASEDEF 1.6 02/22/2019 0916   O2SAT 85.8 01/15/2020 2253     Coagulation Profile: No results for input(s): INR, PROTIME in the last 168 hours.  Cardiac Enzymes: No results for input(s): CKTOTAL, CKMB, CKMBINDEX, TROPONINI in the last 168 hours.  HbA1C: Hgb A1c MFr Bld  Date/Time Value Ref Range Status  01/10/2020 04:23 AM 6.0 (H) 4.8 - 5.6 % Final    Comment:    (NOTE) Pre diabetes:          5.7%-6.4%  Diabetes:              >6.4%  Glycemic control for   <7.0% adults with diabetes   05/20/2019 03:18 AM 6.4 (H) 4.8 - 5.6 % Final    Comment:    (NOTE) Pre diabetes:          5.7%-6.4% Diabetes:              >6.4% Glycemic control for   <7.0% adults with diabetes     CBG: Recent Labs  Lab 01/16/20 0843 01/16/20 1116 01/16/20 1644 01/16/20 2109 01/17/20 0606  GLUCAP  207* 263* 275* 186* 183*    Signature:  Melody Comas, MD [facility] Office: [phone]   See Amion for Pager Details

## 2020-01-17 NOTE — Progress Notes (Signed)
Occupational Therapy Treatment Patient Details Name: Gabrielle [name] MRN: [id number] DOB: 1946-02-05 Today's Date: 01/17/2020    History of present illness 74 year old female with history of COPD, HFpEF, atrial fibrillation, anemia, QT prolongation, CAD s/p MI and CABG x7, hypertension, tachybradycardia syndrome status post pacemaker presented with progressive dyspnea patient usually is on 2 L of oxygen at home for COPD.  And was requiring 5 L of oxygen.  ABG showed PCO2 of 71, pH 7.32.   OT comments  Pt up in chair, had just been placed on 15 L HFNC to take meds and eat breakfast. Pt with lethargy, slow processing speed and impaired memory this visit. Self fed, participated in grooming in sitting and stood for pressure relief with RW and min guard assist. Updated d/c recommendation to SNF given pt's complicated hospital course and decrease in function.   Follow Up Recommendations  SNF    Equipment Recommendations  None recommended by OT    Recommendations for Other Services      Precautions / Restrictions Precautions Precautions: Fall Precaution Comments: monitor O2       Mobility Bed Mobility               General bed mobility comments: pt received in chair, reports having slept in chair  Transfers Overall transfer level: Needs assistance Equipment used: Rolling walker (2 wheeled) Transfers: Sit to/from Stand Sit to Stand: Min guard         General transfer comment: stood at chair for pressure relief as pt had been sitting a while    Balance Overall balance assessment: Needs assistance   Sitting balance-Leahy Scale: Good     Standing balance support: Bilateral upper extremity supported Standing balance-Leahy Scale: Fair                             ADL either performed or assessed with clinical judgement   ADL Overall ADL's : Needs assistance/impaired Eating/Feeding: Independent   Grooming: Wash/dry hands;Wash/dry face;Sitting;Set  up;Brushing hair;Total assistance Grooming Details (indicate cue type and reason): pt too fatigued to comb her hair                                     Vision       Perception     Praxis      Cognition Arousal/Alertness: Lethargic Behavior During Therapy: Flat affect Overall Cognitive Status: Impaired/Different from baseline Area of Impairment: Following commands;Problem solving;Attention;Memory                   Current Attention Level: Selective Memory: Decreased short-term memory Following Commands: Follows one step commands with increased time     Problem Solving: Slow processing;Decreased initiation;Difficulty sequencing;Requires verbal cues General Comments: pt with Sp02 86-88% on 15L [place]        Exercises     Shoulder Instructions       General Comments      Pertinent Vitals/ Pain       Pain Assessment: No/denies pain  Home Living                                          Prior Functioning/Environment              Frequency  Min 2X/week  Progress Toward Goals  OT Goals(current goals can now be found in the care plan section)  Progress towards OT goals: Not progressing toward goals - comment (pt with decline in respiratory status, lethargy)  Acute Rehab OT Goals Patient Stated Goal: to eat her breakfast OT Goal Formulation: With patient Time For Goal Achievement: 01/24/20 Potential to Achieve Goals: Good  Plan Discharge plan needs to be updated    Co-evaluation                 AM-PAC OT "6 Clicks" Daily Activity     Outcome Measure   Help from another person eating meals?: None Help from another person taking care of personal grooming?: A Little Help from another person toileting, which includes using toliet, bedpan, or urinal?: A Little Help from another person bathing (including washing, rinsing, drying)?: A Lot Help from another person to put on and taking off regular upper body  clothing?: A Little Help from another person to put on and taking off regular lower body clothing?: A Lot 6 Click Score: 17    End of Session Equipment Utilized During Treatment: Rolling walker;Oxygen  OT Visit Diagnosis: Unsteadiness on feet (R26.81)   Activity Tolerance Patient limited by fatigue;Patient limited by lethargy;Treatment limited secondary to medical complications (Comment) (desats on HFNC)   Patient Left in chair;with call bell/phone within reach   Nurse Communication          Time: 5462-7035 OT Time Calculation (min): 22 min  Charges: OT General Charges $OT Visit: 1 Visit OT Treatments $Self Care/Home Management : 8-22 mins  Martie Round, OTR/L Acute Rehabilitation Services Pager: [phone] Office: [phone]   Evern Bio 01/17/2020, 9:50 AM

## 2020-01-17 NOTE — Plan of Care (Signed)
  Problem: Education: Goal: Knowledge of General Education information will improve Description: Including pain rating scale, medication(s)/side effects and non-pharmacologic comfort measures Outcome: Progressing   Problem: Health Behavior/Discharge Planning: Goal: Ability to manage health-related needs will improve Outcome: Progressing   Problem: Clinical Measurements: Goal: Ability to maintain clinical measurements within normal limits will improve 01/17/2020 1419 by Luna Kitchens, RN Outcome: Progressing 01/17/2020 1419 by Luna Kitchens, RN Outcome: Not Progressing Goal: Will remain free from infection 01/17/2020 1419 by Luna Kitchens, RN Outcome: Progressing 01/17/2020 1419 by Luna Kitchens, RN Outcome: Not Progressing Goal: Diagnostic test results will improve 01/17/2020 1419 by Luna Kitchens, RN Outcome: Progressing 01/17/2020 1419 by Luna Kitchens, RN Outcome: Not Progressing Goal: Respiratory complications will improve 01/17/2020 1419 by Luna Kitchens, RN Outcome: Progressing 01/17/2020 1419 by Luna Kitchens, RN Outcome: Not Progressing Goal: Cardiovascular complication will be avoided 01/17/2020 1419 by Luna Kitchens, RN Outcome: Progressing 01/17/2020 1419 by Luna Kitchens, RN Outcome: Not Progressing   Problem: Activity: Goal: Risk for activity intolerance will decrease 01/17/2020 1419 by Luna Kitchens, RN Outcome: Progressing 01/17/2020 1419 by Luna Kitchens, RN Outcome: Not Progressing   Problem: Nutrition: Goal: Adequate nutrition will be maintained 01/17/2020 1419 by Luna Kitchens, RN Outcome: Progressing 01/17/2020 1419 by Luna Kitchens, RN Outcome: Not Progressing   Problem: Coping: Goal: Level of anxiety will decrease 01/17/2020 1419 by Luna Kitchens, RN Outcome: Progressing 01/17/2020 1419 by Luna Kitchens, RN Outcome: Not Progressing   Problem: Elimination: Goal: Will not experience complications related to bowel motility 01/17/2020 1419  by Luna Kitchens, RN Outcome: Progressing 01/17/2020 1419 by Luna Kitchens, RN Outcome: Not Progressing Goal: Will not experience complications related to urinary retention 01/17/2020 1419 by Luna Kitchens, RN Outcome: Progressing 01/17/2020 1419 by Luna Kitchens, RN Outcome: Not Progressing   Problem: Pain Managment: Goal: General experience of comfort will improve 01/17/2020 1419 by Luna Kitchens, RN Outcome: Progressing 01/17/2020 1419 by Luna Kitchens, RN Outcome: Not Progressing   Problem: Safety: Goal: Ability to remain free from injury will improve 01/17/2020 1419 by Luna Kitchens, RN Outcome: Progressing 01/17/2020 1419 by Luna Kitchens, RN Outcome: Not Progressing   Problem: Skin Integrity: Goal: Risk for impaired skin integrity will decrease 01/17/2020 1419 by Luna Kitchens, RN Outcome: Progressing 01/17/2020 1419 by Luna Kitchens, RN Outcome: Not Progressing

## 2020-01-17 NOTE — Progress Notes (Signed)
Pt currently off BIPAP and on flow salter nasal cannula.  SP02 90% tolerating at this time.  RN and RT will monitor.

## 2020-01-17 NOTE — Progress Notes (Signed)
Pt taking off BiPAP throughout the night. This RN was able to educate the importance of keeping the BiPAP on. At approx 0520 patient took BiPAP off and refused to place back on, stating that she needed something to drink. This RN placed the pt on 15L HFNC, currently sats at 88-89%. Will continue to monitor the patient.

## 2020-01-17 NOTE — Progress Notes (Signed)
Progress Note  Patient Name: Gabrielle [name] Date of Encounter: 01/17/2020  [facility] Cardiologist: Donato Schultz, MD   Subjective   Patient intermittently compliant with BiPAP overnight. On HFLNC today. Continues to feel SOB. Started on lasix 40mg  IV with albumin per critical care.  Inpatient Medications    Scheduled Meds: . atorvastatin  20 mg Oral QPM  . budesonide (PULMICORT) nebulizer solution  0.25 mg Nebulization BID  . citalopram  40 mg Oral Daily  . diltiazem  240 mg Oral Daily  . enoxaparin (LOVENOX) injection  40 mg Subcutaneous Q24H  . furosemide  20 mg Oral Daily  . insulin aspart  0-9 Units Subcutaneous TID WC  . ipratropium-albuterol  3 mL Nebulization TID  . mouth rinse  15 mL Mouth Rinse BID  . melatonin  10 mg Oral QHS  . metoprolol tartrate  100 mg Oral BID  . pantoprazole  40 mg Oral Daily  . [START ON 01/20/2020] predniSONE  20 mg Oral Q breakfast  . predniSONE  40 mg Oral Q breakfast  . sodium chloride flush  3 mL Intravenous Q12H  . sucralfate  1 g Oral TID AC   Continuous Infusions:  PRN Meds: acetaminophen **OR** acetaminophen, ALPRAZolam, docusate sodium, sodium chloride   Vital Signs    Vitals:   01/17/20 0026 01/17/20 0119 01/17/20 0439 01/17/20 0520  BP:   (!) 132/98   Pulse:   74   Resp:   18   Temp:   (!) 97.4 F (36.3 C)   TempSrc:   Oral   SpO2: 90% 91% 91% (!) 89%  Weight:   90 kg   Height:        Intake/Output Summary (Last 24 hours) at 01/17/2020 0743 Last data filed at 01/17/2020 0445 Gross per 24 hour  Intake 513 ml  Output 850 ml  Net -337 ml   Last 3 Weights 01/17/2020 01/16/2020 01/14/2020  Weight (lbs) 198 lb 6.6 oz 196 lb 6.9 oz 199 lb 11.8 oz  Weight (kg) 90 kg 89.1 kg 90.6 kg      Telemetry    Rate controlled Afib - Personally Reviewed  ECG    No new ECG - Personally Reviewed  Physical Exam   GEN: Sitting up in chair on HFNC; able to speak in short sentence  Neck: Difficult to assess JVD  due to body habitus Cardiac: Irregular, no murmurs Respiratory: Diffuse rales at the bases. No wheezes GI: Obese, soft MS: Warm, trace edema Neuro:  Nonfocal  Psych: Normal affect   Labs    High Sensitivity Troponin:   Recent Labs  Lab 01-15-20 1722 01-15-2020 2025 01/11/20 0127 01/11/20 1717  TROPONINIHS 11 10 12  18*      Chemistry Recent Labs  Lab 01/15/20 0240 01/16/20 0557 01/17/20 0113  NA 140 145 141  K 3.5 4.0 3.6  CL 91* 94* 93*  CO2 36* 37* 36*  GLUCOSE 176* 206* 161*  BUN 42* 49* 51*  CREATININE 1.28* 1.41* 1.29*  CALCIUM 9.7 10.0 10.0  GFRNONAA 43* 39* 43*  ANIONGAP 13 14 12      HematologyNo results for input(s): WBC, RBC, HGB, HCT, MCV, MCH, MCHC, RDW, PLT in the last 168 hours.  BNPNo results for input(s): BNP, PROBNP in the last 168 hours.   DDimer No results for input(s): DDIMER in the last 168 hours.   Radiology    DG CHEST PORT 1 VIEW  Result Date: 01/16/2020 CLINICAL DATA:  Hypoxia EXAM: PORTABLE CHEST 1  VIEW COMPARISON:  Radiograph 04/08/2019, CT 01/13/2020 FINDINGS: Bilateral effusions are present, right greater than left. These may be slightly increased from the comparison CT 01/13/2020. The adjacent opacity which likely reflect a degree of passive atelectasis though underlying airspace disease is difficult to exclude. Additional septal and fissural thickening and hazy interstitial opacity with indistinct vascularity/vascular congestion. More bandlike areas of chronic scarring and architectural distortion are noted in the lungs bilaterally as well. Pacer pack overlies left chest wall with leads in stable position directed towards the cardiac apex and right atrium. Postsurgical changes related to prior CABG including intact and aligned sternotomy wires and multiple surgical clips projecting over the mediastinum. The aorta is calcified. The remaining cardiomediastinal contours are unremarkable. No acute osseous or soft tissue abnormality. Degenerative  changes are present in the imaged spine and shoulders. IMPRESSION: 1. Bilateral effusions, right greater than left. These may be slightly increased from the comparison CT 01/13/2020. 2. Adjacent opacity is likely some passive atelectatic changes of underlying infection is difficult to exclude. 3. Additional background features suggest CHF/volume overload with vascular congestion and cardiomegaly. Electronically Signed   By: Kreg ShropshirePrice  DeHay M.D.   On: 01/16/2020 00:24   VAS US LOWER EXTREMITY VENOUS (DVT)  Result Date: 01/15/2020  Lower Venous DVT Study Indications: Dyspnea, hypoxia.  Limitations: Body habitus and poor ultrasound/tissue interface. Comparison Study: No prior study Performing Technologist: Gertie FeyMichelle Simonetti MHA, RDMS, RVT, RDCS  Examination Guidelines: A complete evaluation includes B-mode imaging, spectral Doppler, color Doppler, and power Doppler as needed of all accessible portions of each vessel. Bilateral testing is considered an integral part of a complete examination. Limited examinations for reoccurring indications may be performed as noted. The reflux portion of the exam is performed with the patient in reverse Trendelenburg.  +---------+---------------+---------+-----------+----------+--------------+ RIGHT    CompressibilityPhasicitySpontaneityPropertiesThrombus Aging +---------+---------------+---------+-----------+----------+--------------+ CFV      Full           No       Yes                                 +---------+---------------+---------+-----------+----------+--------------+ SFJ      Full                                                        +---------+---------------+---------+-----------+----------+--------------+ FV Prox  Full                                                        +---------+---------------+---------+-----------+----------+--------------+ FV Mid   Full                                                         +---------+---------------+---------+-----------+----------+--------------+ FV DistalFull                                                        +---------+---------------+---------+-----------+----------+--------------+  PFV      Full                                                        +---------+---------------+---------+-----------+----------+--------------+ POP      Full           No       Yes                                 +---------+---------------+---------+-----------+----------+--------------+ PTV      Full                                                        +---------+---------------+---------+-----------+----------+--------------+ PERO     Full                                                        +---------+---------------+---------+-----------+----------+--------------+ Limited evaluation of calf veins.  +---------+---------------+---------+-----------+----------+--------------+ LEFT     CompressibilityPhasicitySpontaneityPropertiesThrombus Aging +---------+---------------+---------+-----------+----------+--------------+ CFV      Full           No       Yes                                 +---------+---------------+---------+-----------+----------+--------------+ SFJ      Full                                                        +---------+---------------+---------+-----------+----------+--------------+ FV Prox  Full                                                        +---------+---------------+---------+-----------+----------+--------------+ FV Mid   Full                                                        +---------+---------------+---------+-----------+----------+--------------+ FV DistalFull                                                        +---------+---------------+---------+-----------+----------+--------------+ PFV      Full                                                         +---------+---------------+---------+-----------+----------+--------------+  POP      Full           No       Yes                                 +---------+---------------+---------+-----------+----------+--------------+ PTV      Full                                                        +---------+---------------+---------+-----------+----------+--------------+ PERO     Full                                                        +---------+---------------+---------+-----------+----------+--------------+ Limited evaluation of calf veins    Summary: RIGHT: - There is no evidence of deep vein thrombosis in the lower extremity. However, portions of this examination were limited- see technologist comments above.  - No cystic structure found in the popliteal fossa.  LEFT: - There is no evidence of deep vein thrombosis in the lower extremity. However, portions of this examination were limited- see technologist comments above.  - No cystic structure found in the popliteal fossa.  Bilateral lower extremity venous flow is pulsatile, suggestive of possibly elevated right heart pressure.  *See table(s) above for measurements and observations. Electronically signed by Lemar Livings MD on 01/15/2020 at 4:29:34 PM.    Final     Cardiac Studies   Echo 01/10/20 1. Left ventricular ejection fraction, by estimation, is 55 to 60%. The  left ventricle has normal function. The left ventricle has no regional  wall motion abnormalities. Left ventricular diastolic parameters are  indeterminate.  2. Right ventricular systolic function is normal. The right ventricular  size is normal.  3. Left atrial size was mildly dilated.  4. The mitral valve is normal in structure. Mild mitral valve  regurgitation. No evidence of mitral stenosis. Severe mitral annular  calcification.  5. The aortic valve is normal in structure. Aortic valve regurgitation is  not visualized. No aortic stenosis is present.  6.  The inferior vena cava is dilated in size with <50% respiratory  variability, suggesting right atrial pressure of 15 mmHg.   Pacemaker download 01/14/2020: Normal device function. In AFib persistently since November 21, 2019 (overall burden 41% in last 12 months). Occasional atrial undersensing of fib waves. Throughout most of this period, ventricular rate control has been fair, except for last 1-2 weeks. RV pacing only 14%.  Patient Profile     74 y.o. female with COPD on home O2, persistent Afib, HTN, CKDIII, history of GIB, and chronic diastolic heart failure who presented with acute on chronic respiratory failure thought to be multifactorial in the setting of COPD and acute on chronic diastolic heart failure exacerbation for which we are now consulted.  Assessment & Plan    #Acute on chronic diastolic heart failure: Baseline weight 196-199lbs. CXR with pulmonary congestion and bilateral pleural effusions concerning for volume overload on 11/15. Given diamox/lasix yesterday but wt up without sufficient response to diuretics. -Lasix 40mg  IV BID with albumin per critical care; if lack of response, can up-titrate lasix  dosing -Re-dose diamox this AM -Goal wt around 196lbs or below (may need to establish new dry weight) -Will need to watch Cr closely with diuresis -Monitor I/Os and daily weights  #Persistent Afib #SSS s/p PPM placement: Last PPM interrogation revealed that patient in uninterrupted Afib since 11/2019 with fairly well controlled rates until days prior to admission. Now failure well rate controlled -Not on AC due to history of GIB and therefore not candidate for DCCV -Continue dilt and metop for rate control  #CAD s/p ACB: No evidence of active ischemia. TTE with preserved EF and no significant WMA. -Continue statin -Not on ASA due to GIB  #Acute on Chronic respiratory Failure #COPD Remains with high O2 requirements. On intermittent BiPAP and HFNC. -On steroids  and nebs for COPD exacerbation -Management per primary team and pulm     For questions or updates, please contact [facility] Please consult [email or web address] for contact info under        Signed, Meriam Sprague, MD  01/17/2020, 7:43 AM

## 2020-01-17 NOTE — Progress Notes (Addendum)
  ReDS Clip Diuretic Study Pt study # [id number]  Your patient has been enrolled in the ReDS Clip Diuretic Study  ReDS reading today 22% Remains within normal range < 35%  CXR yesterday showing bilateral pleural effusion with R>L concerning for volume load. Scr 1.41>1.29. Wt up 2 lb today. UOP 750 mL/24 hr (net-2.6 L).   Reported dry weight is 189 lbs.  Changes to prescribed diuretics recommended:  Plan for diamox, lasix + albumin today   Provider contacted: Dr. Shari Prows Recommendation was accepted by provider.   REDS Clip  READING= 22%  CHEST RULER = 33 Clip Station = B   Orthodema score = 3 Signs/Symptoms Score   Mild edema, no orthopnea 0 No congestion  Moderate edema, no orthopnea 1 Low-grade orthodema/congestion  Severe edema OR orthopnea 2   Moderate edema and orthopnea 3 High-grade orthodema/congestion  Severe edema AND orthopnea 4    Sharen Hones, PharmD, BCPS Heart Failure Stewardship Pharmacist Phone [phone]  Please check [email or web address] for unit-specific pharmacist phone numbers

## 2020-01-17 NOTE — Progress Notes (Signed)
PROGRESS NOTE    Gabrielle PalauVirginia W Ackman  ZOX:[id number] DOB: Mar 07, 1945 DOA: 01/20/2020 PCP: Henrine Screwshacker, Robert, MD     Brief Narrative:  Gabrielle [name] is a 74 year old female with history of COPD, HFpEF, atrial fibrillation, anemia, QT prolongation, CAD s/p MI and CABG x7, hypertension, tachybradycardia syndrome status post pacemaker presented with progressive dyspnea. Patient usually is on 2 L of oxygen at home for COPD.  And was requiring 5 L of oxygen.  ABG showed PCO2 of 71, pH 7.32.  Due to her respiratory failure, she eventually required BiPAP. Cardiology and pulmonology consulted.  New events last 24 hours / Subjective: Admits to shortness of breath, remains on 15 L high flow nasal cannula O2 this morning  Assessment & Plan:   Principal Problem:   Acute exacerbation of CHF (congestive heart failure) (HCC) Active Problems:   Hypertensive heart disease with CHF (congestive heart failure) (HCC)   COPD exacerbation (HCC)   Atrial fibrillation (HCC)   Coronary artery disease due to lipid rich plaque   Tachy-brady syndrome (HCC)   Acute on chronic respiratory failure with hypoxia (HCC)   Acute respiratory failure (HCC)   Acute on chronic hypoxemic, hypercapnic respiratory failure -Requires 2 L nasal cannula O2 at baseline -Initially respiratory failure thought to be secondary to COPD exacerbation as well as component of heart failure exacerbation -CT chest 11/15: No definitive imaging findings to suggest interstitial lung disease at this time. Dilatation of the pulmonic trunk (3.9 cm in diameter), concerning for pulmonary arterial hypertension. -Appreciate pulmonology -Solu-Medrol weaned to prednisone, continue breathing treatments -Remains on 15 L high flow nasal cannula O2 this morning  Acute on chronic diastolic heart failure -EF 55-60% -CXR 11/15: Bilateral effusions, right greater than left, additional background features suggest CHF/volume overload with vascular congestion  and cardiomegaly. -Continue IV Lasix  AKI on CKD stage IIIa -Baseline creatinine 1.2-1.3 -Improved  Chronic atrial fibrillation -Not on anticoagulation due to history of GI bleed -Continue diltiazem, metoprolol  Prediabetes -Hemoglobin A1c 6.0 -Sliding scale insulin  Tachybradycardia syndrome status post pacemaker -Stable  Hyperlipidemia -Continue Lipitor  Diarrhea -C. difficile, GI PCR negative  Depression/anxiety -Continue Celexa   DVT prophylaxis:  enoxaparin (LOVENOX) injection 40 mg Start: 01/30/2020 2200  Code Status: Full code Family Communication: None at bedside Disposition Plan:  Status is: Inpatient  Remains inpatient appropriate because:Hemodynamically unstable, IV treatments appropriate due to intensity of illness or inability to take PO and Inpatient level of care appropriate due to severity of illness   Dispo: The patient is from: Home              Anticipated d/c is to: SNF              Anticipated d/c date is: > 3 days              Patient currently is not medically stable to d/c.  Remains on high flow nasal cannula O2    Consultants:   Cardiology  Pulmonology   Antimicrobials:  Anti-infectives (From admission, onward)   None        Objective: Vitals:   01/17/20 0520 01/17/20 0800 01/17/20 0842 01/17/20 1059  BP:    114/75  Pulse:  85 81 89  Resp:  (!) 24  20  Temp:    98.2 F (36.8 C)  TempSrc:    Oral  SpO2: (!) 89% 94%    Weight:      Height:        Intake/Output Summary (Last  24 hours) at 01/17/2020 1155 Last data filed at 01/17/2020 0930 Gross per 24 hour  Intake 298 ml  Output 850 ml  Net -552 ml   Filed Weights   01/14/20 0311 01/16/20 0322 01/17/20 0439  Weight: 90.6 kg 89.1 kg 90 kg    Examination:  General exam: Appears calm and comfortable  Respiratory system: Diminished breath sounds without wheeze Cardiovascular system: S1 & S2 heard, irregular rhythm, rate 80s. No murmurs. Gastrointestinal system:  Abdomen is nondistended, soft and nontender. Normal bowel sounds heard. Central nervous system: Alert and oriented. No focal neurological deficits. Speech clear.  Extremities: Symmetric in appearance  Skin: No rashes, lesions or ulcers on exposed skin  Psychiatry: Judgement and insight appear normal. Mood & affect appropriate.   Data Reviewed: I have personally reviewed following labs and imaging studies  CBC: No results for input(s): WBC, NEUTROABS, HGB, HCT, MCV, PLT in the last 168 hours. Basic Metabolic Panel: Recent Labs  Lab 01/13/20 0111 01/14/20 0040 01/15/20 0240 01/16/20 0557 01/17/20 0113  NA 140 139 140 145 141  K 3.5 3.9 3.5 4.0 3.6  CL 91* 93* 91* 94* 93*  CO2 39* 35* 36* 37* 36*  GLUCOSE 164* 185* 176* 206* 161*  BUN 36* 43* 42* 49* 51*  CREATININE 1.45* 1.44* 1.28* 1.41* 1.29*  CALCIUM 9.5 9.6 9.7 10.0 10.0  MG  --   --   --   --  2.0   GFR: Estimated Creatinine Clearance: 39.1 mL/min (A) (by C-G formula based on SCr of 1.29 mg/dL (H)). Liver Function Tests: Recent Labs  Lab 01/17/20 0113  AST 29  ALT 18  ALKPHOS 41  BILITOT 0.7  PROT 6.4*  ALBUMIN 3.7   No results for input(s): LIPASE, AMYLASE in the last 168 hours. No results for input(s): AMMONIA in the last 168 hours. Coagulation Profile: No results for input(s): INR, PROTIME in the last 168 hours. Cardiac Enzymes: No results for input(s): CKTOTAL, CKMB, CKMBINDEX, TROPONINI in the last 168 hours. BNP (last 3 results) No results for input(s): PROBNP in the last 8760 hours. HbA1C: No results for input(s): HGBA1C in the last 72 hours. CBG: Recent Labs  Lab 01/16/20 1116 01/16/20 1644 01/16/20 2109 01/17/20 0606 01/17/20 1101  GLUCAP 263* 275* 186* 183* 226*   Lipid Profile: No results for input(s): CHOL, HDL, LDLCALC, TRIG, CHOLHDL, LDLDIRECT in the last 72 hours. Thyroid Function Tests: No results for input(s): TSH, T4TOTAL, FREET4, T3FREE, THYROIDAB in the last 72 hours. Anemia  Panel: No results for input(s): VITAMINB12, FOLATE, FERRITIN, TIBC, IRON, RETICCTPCT in the last 72 hours. Sepsis Labs: No results for input(s): PROCALCITON, LATICACIDVEN in the last 168 hours.  Recent Results (from the past 240 hour(s))  Respiratory Panel by RT PCR (Flu A&B, Covid) - Nasopharyngeal Swab     Status: None   Collection Time: 02/03/2020  9:12 PM   Specimen: Nasopharyngeal Swab  Result Value Ref Range Status   SARS Coronavirus 2 by RT PCR NEGATIVE NEGATIVE Final    Comment: (NOTE) SARS-CoV-2 target nucleic acids are NOT DETECTED.  The SARS-CoV-2 RNA is generally detectable in upper respiratoy specimens during the acute phase of infection. The lowest concentration of SARS-CoV-2 viral copies this assay can detect is 131 copies/mL. A negative result does not preclude SARS-Cov-2 infection and should not be used as the sole basis for treatment or other patient management decisions. A negative result may occur with  improper specimen collection/handling, submission of specimen other than nasopharyngeal swab, presence of  viral mutation(s) within the areas targeted by this assay, and inadequate number of viral copies (<131 copies/mL). A negative result must be combined with clinical observations, patient history, and epidemiological information. The expected result is Negative.  Fact Sheet for Patients:  https://www.moore.com/  Fact Sheet for Healthcare Providers:  https://www.young.biz/  This test is no t yet approved or cleared by the [place] FDA and  has been authorized for detection and/or diagnosis of SARS-CoV-2 by FDA under an Emergency Use Authorization (EUA). This EUA will remain  in effect (meaning this test can be used) for the duration of the COVID-19 declaration under Section 564(b)(1) of the Act, 21 U.S.C. section 360bbb-3(b)(1), unless the authorization is terminated or revoked sooner.     Influenza A by PCR  NEGATIVE NEGATIVE Final   Influenza B by PCR NEGATIVE NEGATIVE Final    Comment: (NOTE) The Xpert Xpress SARS-CoV-2/FLU/RSV assay is intended as an aid in  the diagnosis of influenza from Nasopharyngeal swab specimens and  should not be used as a sole basis for treatment. Nasal washings and  aspirates are unacceptable for Xpert Xpress SARS-CoV-2/FLU/RSV  testing.  Fact Sheet for Patients: https://www.moore.com/  Fact Sheet for Healthcare Providers: https://www.young.biz/  This test is not yet approved or cleared by the [place] FDA and  has been authorized for detection and/or diagnosis of SARS-CoV-2 by  FDA under an Emergency Use Authorization (EUA). This EUA will remain  in effect (meaning this test can be used) for the duration of the  Covid-19 declaration under Section 564(b)(1) of the Act, 21  U.S.C. section 360bbb-3(b)(1), unless the authorization is  terminated or revoked. Performed at [facility], [address] [place], [place] [address]   MRSA PCR Screening     Status: None   Collection Time: 01/10/20 12:58 AM   Specimen: Nasal Mucosa; Nasopharyngeal  Result Value Ref Range Status   MRSA by PCR NEGATIVE NEGATIVE Final    Comment:        The GeneXpert MRSA Assay (FDA approved for NASAL specimens only), is one component of a comprehensive MRSA colonization surveillance program. It is not intended to diagnose MRSA infection nor to guide or monitor treatment for MRSA infections. Performed at [facility], [address] [place], [place] [address]   C Difficile Quick Screen w PCR reflex     Status: None   Collection Time: 01/12/20  5:02 AM   Specimen: STOOL  Result Value Ref Range Status   C Diff antigen NEGATIVE NEGATIVE Final   C Diff toxin NEGATIVE NEGATIVE Final   C Diff interpretation No C. difficile detected.  Final    Comment: Performed at [facility], [address], [place], [place]  [address]  Gastrointestinal Panel by PCR , Stool     Status: None   Collection Time: 01/12/20  5:02 AM   Specimen: STOOL  Result Value Ref Range Status   Campylobacter species NOT DETECTED NOT DETECTED Final   Plesimonas shigelloides NOT DETECTED NOT DETECTED Final   Salmonella species NOT DETECTED NOT DETECTED Final   Yersinia enterocolitica NOT DETECTED NOT DETECTED Final   Vibrio species NOT DETECTED NOT DETECTED Final   Vibrio cholerae NOT DETECTED NOT DETECTED Final   Enteroaggregative E coli (EAEC) NOT DETECTED NOT DETECTED Final   Enteropathogenic E coli (EPEC) NOT DETECTED NOT DETECTED Final   Enterotoxigenic E coli (ETEC) NOT DETECTED NOT DETECTED Final   Shiga like toxin producing E coli (STEC) NOT DETECTED  NOT DETECTED Final   Shigella/Enteroinvasive E coli (EIEC) NOT DETECTED NOT DETECTED Final   Cryptosporidium NOT DETECTED NOT DETECTED Final   Cyclospora cayetanensis NOT DETECTED NOT DETECTED Final   Entamoeba histolytica NOT DETECTED NOT DETECTED Final   Giardia lamblia NOT DETECTED NOT DETECTED Final   Adenovirus F40/41 NOT DETECTED NOT DETECTED Final   Astrovirus NOT DETECTED NOT DETECTED Final   Norovirus GI/GII NOT DETECTED NOT DETECTED Final   Rotavirus A NOT DETECTED NOT DETECTED Final   Sapovirus (I, II, IV, and V) NOT DETECTED NOT DETECTED Final    Comment: Performed at [facility], [address] [place], [place] [address]      Radiology Studies: DG CHEST PORT 1 VIEW  Result Date: 01/16/2020 CLINICAL DATA:  Hypoxia EXAM: PORTABLE CHEST 1 VIEW COMPARISON:  Radiograph February 02, 2020, CT 01/13/2020 FINDINGS: Bilateral effusions are present, right greater than left. These may be slightly increased from the comparison CT 01/13/2020. The adjacent opacity which likely reflect a degree of passive atelectasis though underlying airspace disease is difficult to exclude. Additional septal and fissural thickening and hazy interstitial opacity with indistinct  vascularity/vascular congestion. More bandlike areas of chronic scarring and architectural distortion are noted in the lungs bilaterally as well. Pacer pack overlies left chest wall with leads in stable position directed towards the cardiac apex and right atrium. Postsurgical changes related to prior CABG including intact and aligned sternotomy wires and multiple surgical clips projecting over the mediastinum. The aorta is calcified. The remaining cardiomediastinal contours are unremarkable. No acute osseous or soft tissue abnormality. Degenerative changes are present in the imaged spine and shoulders. IMPRESSION: 1. Bilateral effusions, right greater than left. These may be slightly increased from the comparison CT 01/13/2020. 2. Adjacent opacity is likely some passive atelectatic changes of underlying infection is difficult to exclude. 3. Additional background features suggest CHF/volume overload with vascular congestion and cardiomegaly. Electronically Signed   By: Kreg Shropshire M.D.   On: 01/16/2020 00:24   VAS [place] LOWER EXTREMITY VENOUS (DVT)  Result Date: 01/15/2020  Lower Venous DVT Study Indications: Dyspnea, hypoxia.  Limitations: Body habitus and poor ultrasound/tissue interface. Comparison Study: No prior study Performing Technologist: Gertie Fey MHA, RDMS, RVT, RDCS  Examination Guidelines: A complete evaluation includes B-mode imaging, spectral Doppler, color Doppler, and power Doppler as needed of all accessible portions of each vessel. Bilateral testing is considered an integral part of a complete examination. Limited examinations for reoccurring indications may be performed as noted. The reflux portion of the exam is performed with the patient in reverse Trendelenburg.  +---------+---------------+---------+-----------+----------+--------------+ RIGHT    CompressibilityPhasicitySpontaneityPropertiesThrombus Aging  +---------+---------------+---------+-----------+----------+--------------+ CFV      Full           No       Yes                                 +---------+---------------+---------+-----------+----------+--------------+ SFJ      Full                                                        +---------+---------------+---------+-----------+----------+--------------+ FV Prox  Full                                                        +---------+---------------+---------+-----------+----------+--------------+  FV Mid   Full                                                        +---------+---------------+---------+-----------+----------+--------------+ FV DistalFull                                                        +---------+---------------+---------+-----------+----------+--------------+ PFV      Full                                                        +---------+---------------+---------+-----------+----------+--------------+ POP      Full           No       Yes                                 +---------+---------------+---------+-----------+----------+--------------+ PTV      Full                                                        +---------+---------------+---------+-----------+----------+--------------+ PERO     Full                                                        +---------+---------------+---------+-----------+----------+--------------+ Limited evaluation of calf veins.  +---------+---------------+---------+-----------+----------+--------------+ LEFT     CompressibilityPhasicitySpontaneityPropertiesThrombus Aging +---------+---------------+---------+-----------+----------+--------------+ CFV      Full           No       Yes                                 +---------+---------------+---------+-----------+----------+--------------+ SFJ      Full                                                         +---------+---------------+---------+-----------+----------+--------------+ FV Prox  Full                                                        +---------+---------------+---------+-----------+----------+--------------+ FV Mid   Full                                                        +---------+---------------+---------+-----------+----------+--------------+  FV DistalFull                                                        +---------+---------------+---------+-----------+----------+--------------+ PFV      Full                                                        +---------+---------------+---------+-----------+----------+--------------+ POP      Full           No       Yes                                 +---------+---------------+---------+-----------+----------+--------------+ PTV      Full                                                        +---------+---------------+---------+-----------+----------+--------------+ PERO     Full                                                        +---------+---------------+---------+-----------+----------+--------------+ Limited evaluation of calf veins    Summary: RIGHT: - There is no evidence of deep vein thrombosis in the lower extremity. However, portions of this examination were limited- see technologist comments above.  - No cystic structure found in the popliteal fossa.  LEFT: - There is no evidence of deep vein thrombosis in the lower extremity. However, portions of this examination were limited- see technologist comments above.  - No cystic structure found in the popliteal fossa.  Bilateral lower extremity venous flow is pulsatile, suggestive of possibly elevated right heart pressure.  *See table(s) above for measurements and observations. Electronically signed by Lemar Livings MD on 01/15/2020 at 4:29:34 PM.    Final       Scheduled Meds: . atorvastatin  20 mg Oral QPM  . budesonide (PULMICORT)  nebulizer solution  0.25 mg Nebulization BID  . citalopram  40 mg Oral Daily  . diltiazem  240 mg Oral Daily  . enoxaparin (LOVENOX) injection  40 mg Subcutaneous Q24H  . furosemide  40 mg Intravenous Q12H  . insulin aspart  0-9 Units Subcutaneous TID WC  . ipratropium-albuterol  3 mL Nebulization TID  . mouth rinse  15 mL Mouth Rinse BID  . melatonin  10 mg Oral QHS  . metoprolol tartrate  100 mg Oral BID  . pantoprazole  40 mg Oral Daily  . [START ON 01/21/2020] predniSONE  10 mg Oral Q breakfast  . [START ON 01/19/2020] predniSONE  20 mg Oral Q breakfast  . predniSONE  40 mg Oral Q breakfast  . sodium chloride flush  3 mL Intravenous Q12H  . sucralfate  1 g Oral TID AC   Continuous Infusions: . albumin human 12.5 g (01/17/20 0928)  LOS: 7 days      Time spent: 40 minutes   Noralee Stain, DO [facility] 01/17/2020, 11:55 AM   Available via Epic secure chat 7am-7pm After these hours, please refer to coverage provider listed on [email or web address]

## 2020-01-18 ENCOUNTER — Other Ambulatory Visit: Payer: Self-pay | Admitting: Dermatology

## 2020-01-18 ENCOUNTER — Inpatient Hospital Stay (HOSPITAL_COMMUNITY): Payer: Medicare Other | Admitting: Certified Registered Nurse Anesthetist

## 2020-01-18 ENCOUNTER — Ambulatory Visit: Payer: Medicare Other | Admitting: Cardiology

## 2020-01-18 ENCOUNTER — Inpatient Hospital Stay (HOSPITAL_COMMUNITY): Payer: Medicare Other

## 2020-01-18 DIAGNOSIS — I4819 Other persistent atrial fibrillation: Secondary | ICD-10-CM | POA: Diagnosis not present

## 2020-01-18 DIAGNOSIS — J9602 Acute respiratory failure with hypercapnia: Secondary | ICD-10-CM | POA: Diagnosis not present

## 2020-01-18 DIAGNOSIS — J9601 Acute respiratory failure with hypoxia: Secondary | ICD-10-CM

## 2020-01-18 DIAGNOSIS — J9621 Acute and chronic respiratory failure with hypoxia: Secondary | ICD-10-CM | POA: Diagnosis not present

## 2020-01-18 DIAGNOSIS — I469 Cardiac arrest, cause unspecified: Secondary | ICD-10-CM

## 2020-01-18 DIAGNOSIS — I5033 Acute on chronic diastolic (congestive) heart failure: Secondary | ICD-10-CM | POA: Diagnosis not present

## 2020-01-18 LAB — CBC
HCT: 33.7 % — ABNORMAL LOW (ref 36.0–46.0)
Hemoglobin: 9.6 g/dL — ABNORMAL LOW (ref 12.0–15.0)
MCH: 25.5 pg — ABNORMAL LOW (ref 26.0–34.0)
MCHC: 28.5 g/dL — ABNORMAL LOW (ref 30.0–36.0)
MCV: 89.6 fL (ref 80.0–100.0)
Platelets: 114 10*3/uL — ABNORMAL LOW (ref 150–400)
RBC: 3.76 MIL/uL — ABNORMAL LOW (ref 3.87–5.11)
RDW: 15 % (ref 11.5–15.5)
WBC: 12.3 10*3/uL — ABNORMAL HIGH (ref 4.0–10.5)
nRBC: 0 % (ref 0.0–0.2)

## 2020-01-18 LAB — POCT I-STAT EG7
Acid-Base Excess: 2 mmol/L (ref 0.0–2.0)
Bicarbonate: 33 mmol/L — ABNORMAL HIGH (ref 20.0–28.0)
Calcium, Ion: 1.23 mmol/L (ref 1.15–1.40)
HCT: 31 % — ABNORMAL LOW (ref 36.0–46.0)
Hemoglobin: 10.5 g/dL — ABNORMAL LOW (ref 12.0–15.0)
O2 Saturation: 68 %
Potassium: 3.8 mmol/L (ref 3.5–5.1)
Sodium: 141 mmol/L (ref 135–145)
TCO2: 36 mmol/L — ABNORMAL HIGH (ref 22–32)
pCO2, Ven: 94.8 mmHg (ref 44.0–60.0)
pH, Ven: 7.15 — CL (ref 7.250–7.430)
pO2, Ven: 48 mmHg — ABNORMAL HIGH (ref 32.0–45.0)

## 2020-01-18 LAB — BASIC METABOLIC PANEL
Anion gap: 11 (ref 5–15)
BUN: 46 mg/dL — ABNORMAL HIGH (ref 8–23)
CO2: 36 mmol/L — ABNORMAL HIGH (ref 22–32)
Calcium: 9.8 mg/dL (ref 8.9–10.3)
Chloride: 95 mmol/L — ABNORMAL LOW (ref 98–111)
Creatinine, Ser: 1.19 mg/dL — ABNORMAL HIGH (ref 0.44–1.00)
GFR, Estimated: 47 mL/min — ABNORMAL LOW (ref >60)
Glucose, Bld: 173 mg/dL — ABNORMAL HIGH (ref 70–99)
Potassium: 4 mmol/L (ref 3.5–5.1)
Sodium: 142 mmol/L (ref 135–145)

## 2020-01-18 LAB — URINALYSIS, ROUTINE W REFLEX MICROSCOPIC
Bilirubin Urine: NEGATIVE
Glucose, UA: NEGATIVE mg/dL
Hgb urine dipstick: NEGATIVE
Ketones, ur: NEGATIVE mg/dL
Leukocytes,Ua: NEGATIVE
Nitrite: NEGATIVE
Protein, ur: NEGATIVE mg/dL
Specific Gravity, Urine: 1.008 (ref 1.005–1.030)
pH: 7 (ref 5.0–8.0)

## 2020-01-18 LAB — URINE CULTURE: Culture: <10000 — AB

## 2020-01-18 LAB — MAGNESIUM: Magnesium: 2.6 mg/dL — ABNORMAL HIGH (ref 1.7–2.4)

## 2020-01-18 LAB — PROCALCITONIN: Procalcitonin: <0.1 ng/mL

## 2020-01-18 LAB — LACTIC ACID, PLASMA: Lactic Acid, Venous: 1.1 mmol/L (ref 0.5–1.9)

## 2020-01-18 LAB — GLUCOSE, CAPILLARY: Glucose-Capillary: 194 mg/dL — ABNORMAL HIGH (ref 70–99)

## 2020-01-18 MED ORDER — SODIUM CHLORIDE 0.9% FLUSH: 10.0000 mL | Freq: Two times a day (BID) | INTRAVENOUS | Status: DC

## 2020-01-18 MED ORDER — GLYCOPYRROLATE 1 MG PO TABS
1.0000 mg | ORAL_TABLET | ORAL | Status: DC | PRN
  Filled 2020-01-18: qty 1

## 2020-01-18 MED ORDER — FENTANYL BOLUS VIA INFUSION
100.0000 ug | INTRAVENOUS | Status: DC | PRN
  Filled 2020-01-18: qty 100

## 2020-01-18 MED ORDER — SODIUM CHLORIDE 0.9% FLUSH: 10.0000 mL | INTRAVENOUS | Status: DC | PRN

## 2020-01-18 MED ORDER — NOREPINEPHRINE 16 MG/250ML-% IV SOLN: 0.0000 ug/min | INTRAVENOUS | Status: DC

## 2020-01-18 MED ORDER — NOREPINEPHRINE 4 MG/250ML-% IV SOLN
0.0000 ug/min | INTRAVENOUS | Status: DC
  Administered 2020-01-18: 5 ug/min via INTRAVENOUS

## 2020-01-18 MED ORDER — CHLORHEXIDINE GLUCONATE CLOTH 2 % EX PADS
6.0000 | MEDICATED_PAD | Freq: Every day | CUTANEOUS | Status: DC
  Administered 2020-01-18: 6 via TOPICAL

## 2020-01-18 MED ORDER — FENTANYL CITRATE (PF) 100 MCG/2ML IJ SOLN
INTRAMUSCULAR | Status: AC
  Administered 2020-01-18: 50 ug
  Filled 2020-01-18: qty 2

## 2020-01-18 MED ORDER — FENTANYL CITRATE (PF) 100 MCG/2ML IJ SOLN: 50.0000 ug | INTRAMUSCULAR | Status: DC | PRN

## 2020-01-18 MED ORDER — POLYVINYL ALCOHOL 1.4 % OP SOLN
1.0000 [drp] | Freq: Four times a day (QID) | OPHTHALMIC | Status: DC | PRN
  Filled 2020-01-18: qty 15

## 2020-01-18 MED ORDER — AMIODARONE HCL IN DEXTROSE 360-4.14 MG/200ML-% IV SOLN
INTRAVENOUS | Status: AC
  Filled 2020-01-18: qty 200

## 2020-01-18 MED ORDER — GLYCOPYRROLATE 0.2 MG/ML IJ SOLN
0.2000 mg | INTRAMUSCULAR | Status: DC | PRN
  Administered 2020-01-18: 0.2 mg via INTRAVENOUS
  Filled 2020-01-18: qty 1

## 2020-01-18 MED ORDER — LORAZEPAM 2 MG/ML IJ SOLN
2.0000 mg | INTRAMUSCULAR | Status: DC | PRN
  Administered 2020-01-18: 2 mg via INTRAVENOUS
  Filled 2020-01-18: qty 1

## 2020-01-18 MED ORDER — FENTANYL 2500MCG IN NS 250ML (10MCG/ML) PREMIX INFUSION
0.0000 ug/h | INTRAVENOUS | Status: DC
  Administered 2020-01-18: 50 ug/h via INTRAVENOUS
  Filled 2020-01-18: qty 250

## 2020-01-18 MED ORDER — FUROSEMIDE 10 MG/ML IJ SOLN
60.0000 mg | Freq: Two times a day (BID) | INTRAMUSCULAR | Status: DC
  Administered 2020-01-18: 60 mg via INTRAVENOUS
  Filled 2020-01-18: qty 6

## 2020-01-18 MED ORDER — POTASSIUM CHLORIDE 10 MEQ/100ML IV SOLN
10.0000 meq | INTRAVENOUS | Status: AC
  Administered 2020-01-18 (×3): 10 meq via INTRAVENOUS
  Filled 2020-01-18 (×3): qty 100

## 2020-01-18 MED ORDER — FENTANYL CITRATE (PF) 100 MCG/2ML IJ SOLN
INTRAMUSCULAR | Status: AC
  Administered 2020-01-18: 100 ug
  Filled 2020-01-18: qty 2

## 2020-01-18 MED ORDER — FENTANYL CITRATE (PF) 100 MCG/2ML IJ SOLN: 25.0000 ug | INTRAMUSCULAR | Status: DC | PRN

## 2020-01-18 MED ORDER — MELATONIN 5 MG PO TABS: 10.0000 mg | ORAL_TABLET | Freq: Every day | ORAL | Status: DC

## 2020-01-18 MED ORDER — BUDESONIDE 0.5 MG/2ML IN SUSP: 0.5000 mg | Freq: Two times a day (BID) | RESPIRATORY_TRACT | Status: DC

## 2020-01-18 MED ORDER — SUCRALFATE 1 G PO TABS
1.0000 g | ORAL_TABLET | Freq: Three times a day (TID) | ORAL | Status: DC
  Filled 2020-01-18: qty 1

## 2020-01-18 MED ORDER — PREDNISONE 20 MG PO TABS: 20.0000 mg | ORAL_TABLET | Freq: Every day | ORAL | Status: DC

## 2020-01-18 MED ORDER — PANTOPRAZOLE SODIUM 40 MG PO PACK: 40.0000 mg | PACK | Freq: Every day | ORAL | Status: DC

## 2020-01-18 MED ORDER — CHLORHEXIDINE GLUCONATE 0.12% ORAL RINSE (MEDLINE KIT)
15.0000 mL | Freq: Two times a day (BID) | OROMUCOSAL | Status: DC
  Administered 2020-01-18: 15 mL via OROMUCOSAL

## 2020-01-18 MED ORDER — CITALOPRAM HYDROBROMIDE 20 MG PO TABS: 40.0000 mg | ORAL_TABLET | Freq: Every day | ORAL | Status: DC

## 2020-01-18 MED ORDER — GLYCOPYRROLATE 0.2 MG/ML IJ SOLN: 0.2000 mg | INTRAMUSCULAR | Status: DC | PRN

## 2020-01-18 MED ORDER — ORAL CARE MOUTH RINSE
15.0000 mL | OROMUCOSAL | Status: DC
  Administered 2020-01-18: 15 mL via OROMUCOSAL

## 2020-01-18 MED ORDER — PREDNISONE 10 MG PO TABS: 10.0000 mg | ORAL_TABLET | Freq: Every day | ORAL | Status: DC

## 2020-01-18 MED ORDER — ATORVASTATIN CALCIUM 10 MG PO TABS: 20.0000 mg | ORAL_TABLET | Freq: Every evening | ORAL | Status: DC

## 2020-01-18 MED ORDER — METOLAZONE 2.5 MG PO TABS
5.0000 mg | ORAL_TABLET | Freq: Once | ORAL | Status: AC
  Administered 2020-01-18: 5 mg via ORAL
  Filled 2020-01-18: qty 2

## 2020-01-19 DIAGNOSIS — R7303 Prediabetes: Secondary | ICD-10-CM

## 2020-01-19 DIAGNOSIS — N183 Chronic kidney disease, stage 3 unspecified: Secondary | ICD-10-CM

## 2020-01-19 DIAGNOSIS — F419 Anxiety disorder, unspecified: Secondary | ICD-10-CM

## 2020-01-19 DIAGNOSIS — R197 Diarrhea, unspecified: Secondary | ICD-10-CM

## 2020-01-19 MED FILL — Medication: Qty: 1 | Status: AC

## 2020-01-23 LAB — CULTURE, BLOOD (ROUTINE X 2)
Culture: NO GROWTH
Culture: NO GROWTH
Special Requests: ADEQUATE
Special Requests: ADEQUATE

## 2020-01-31 NOTE — Progress Notes (Signed)
  ReDS Clip Diuretic Study Pt study # [id number]  Your patient has been enrolled in the ReDS Clip Diuretic Study  Unable to obtain ReDS reading - she was on bipap early this AM and then unfortunately had PEA arrest around 1030.  CXR on 11/15 showing bilateral pleural effusion with R>L concerning for volume load. Scr 1.29>1.19. Wt down 3 lb today. UOP 1.4L/24 hr (net -3.2L).   Reported dry weight is 189 lbs.  Changes to prescribed diuretics recommended:  Plan for metolazone x 1 + lasix 60 IV BID today  Will suspend any further ReDS readings until she is transferred out of the ICU  Provider contacted: Dr. Shari Prows Recommendation was accepted by provider.   REDS Clip  READING= unable to obtain today.  CHEST RULER = 33 Clip Station = B   Orthodema score = 3 Signs/Symptoms Score   Mild edema, no orthopnea 0 No congestion  Moderate edema, no orthopnea 1 Low-grade orthodema/congestion  Severe edema OR orthopnea 2   Moderate edema and orthopnea 3 High-grade orthodema/congestion  Severe edema AND orthopnea 4    Sharen Hones, PharmD, BCPS Heart Failure Stewardship Pharmacist Phone [phone]  Please check [email or web address] for unit-specific pharmacist phone numbers

## 2020-01-31 NOTE — Progress Notes (Signed)
  PROGRESS NOTE  Called for code blue this morning. She was found to be in PEA arrest. By my arrival, patient was intubated and ROSC achieved. I called Bruce, patient's son, for update. Family on way to hospital. Also notified PCCM and Cardiology of event and transfer to ICU.    Noralee Stain, DO [facility] 01/20/2020, 10:59 AM  Available via Epic secure chat 7am-7pm After these hours, please refer to coverage provider listed on [email or web address]

## 2020-01-31 NOTE — Code Documentation (Signed)
  Patient Name: Gabrielle [name]   MRN: [id number]   Date of Birth/ Sex: 08-Jul-1945 , female      Admission Date: 01/16/20  Attending Provider: Noralee Stain, DO  Primary Diagnosis: Acute exacerbation of CHF (congestive heart failure) (HCC)   Indication: Pt was in her usual state of health until this AM, when she was noted to be in PEA arrest. Code blue was subsequently called. At the time of arrival on scene, ACLS protocol was underway.   Technical Description:  - CPR performance duration:  10 minutes  - Was defibrillation or cardioversion used? No   - Was external pacer placed? No  - Was patient intubated pre/post CPR? Yes   Medications Administered: Y = Yes; Blank = No Amiodarone  Y  Atropine    Calcium    Epinephrine  Y  Lidocaine    Magnesium    Norepinephrine    Phenylephrine    Sodium bicarbonate    Vasopressin     Post CPR evaluation:  - Final Status - Was patient successfully resuscitated ? Yes - What is current rhythm? Sinus - What is current hemodynamic status? 140/80  Miscellaneous Information:  - Labs sent, including: Abg, cbc, trop, lactate, bmp  - Primary team notified?  Yes  - Family Notified? Yes  - Additional notes/ transfer status:  Transferred to 2D78     Theotis Barrio, MD  01/07/2020, 10:55 AM

## 2020-01-31 NOTE — Progress Notes (Signed)
PT Cancellation Note  Patient Details Name: Gabrielle [name] MRN: [id number] DOB: 1945/06/15   Cancelled Treatment:    Reason Eval/Treat Not Completed: (P) Medical issues which prohibited therapy Pt moved to 2H s/p Code Blue. PT will follow back tomorrow to determine appropriateness of therapy.  Elvi Leventhal B. Beverely Risen PT, DPT Acute Rehabilitation Services Pager [phone] Office [phone]    [name] 04-Feb-2020, 12:02 PM

## 2020-01-31 NOTE — Anesthesia Procedure Notes (Signed)
Procedure Name: Intubation Date/Time: 2020-02-16 10:35 AM Performed by: Freddi Starr T, CRNA Ventilation: Mask ventilation without difficulty Laryngoscope Size: Glidescope and 3 Grade View: Grade I Tube type: Oral Number of attempts: 1 Airway Equipment and Method: Video-laryngoscopy and Stylet Placement Confirmation: ETT inserted through vocal cords under direct vision,  positive ETCO2 and breath sounds checked- equal and bilateral Secured at: 21 cm Tube secured with: Tape

## 2020-01-31 NOTE — Progress Notes (Signed)
PROGRESS NOTE    Gabrielle [name]  JTT:[id number] DOB: 07-11-1945 DOA: 01/16/2020 PCP: Henrine Screws, MD     Brief Narrative:  Gabrielle [name] is a 74 year old female with history of COPD, HFpEF, atrial fibrillation, anemia, QT prolongation, CAD s/p MI and CABG x7, hypertension, tachybradycardia syndrome status post pacemaker presented with progressive dyspnea. Patient usually is on 2 L of oxygen at home for COPD.  And was requiring 5 L of oxygen.  ABG showed PCO2 of 71, pH 7.32.  Due to her respiratory failure, she eventually required BiPAP. Cardiology and pulmonology consulted.   New events last 24 hours / Subjective: Had episodes of hypothermia overnight.  States that she is not feeling well this morning, continues to be short of breath.  She was placed back on BiPAP this morning.  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 Dilatation of the pulmonic trunk (3.9 cm in diameter), concerning for pulmonary arterial hypertension. -Appreciate pulmonology -Solu-Medrol weaned to prednisone, continue breathing treatments -Remains on BiPAP this morning  Acute on chronic diastolic heart failure -EF 55-60% -CXR 11/15: Bilateral effusions, right greater than left, additional  background features suggest CHF/volume overload with vascular congestion and cardiomegaly. -Continue IV Lasix, trial metolazone -Appreciate cardiology  AKI on CKD stage IIIa -Baseline creatinine 1.2-1.3 -Improved and remains stable  Chronic atrial fibrillation -Not on anticoagulation due to history of GI bleed -Continue diltiazem, metoprolol  Prediabetes -Hemoglobin A1c 6.0 -Sliding scale insulin  Tachybradycardia syndrome status post pacemaker -Stable  Hyperlipidemia -Continue Lipitor  Diarrhea -C. difficile, GI PCR negative  Depression/anxiety -Continue Celexa   DVT prophylaxis:  enoxaparin (LOVENOX) injection 40 mg Start: 2020/01/16 2200  Code Status: Full code Family Communication: None at bedside; updated son over the phone Disposition Plan:  Status is: Inpatient  Remains inpatient appropriate because:Hemodynamically unstable, IV treatments appropriate due to intensity of illness or inability to take PO and Inpatient level of care appropriate due to severity of illness   Dispo: The patient is from: Home              Anticipated d/c is to: SNF              Anticipated d/c date is: > 3 days              Patient currently is not medically stable to d/c.  Remains on BiPAP this morning.  Consulted palliative care medicine to establish rapport and discuss goals of care    Consultants:   Cardiology  Pulmonology  Palliative care medicine   Antimicrobials:  Anti-infectives (From admission, onward)   None       Objective: Vitals:   01/20/2020 0630 01/26/2020 0709 01/25/2020 0715 01/19/2020 0928  BP:  (!) 136/96    Pulse:  78 93   Resp:  19 (!) 26  Temp: (!) 96.3 F (35.7 C)     TempSrc: Rectal     SpO2:  91%  (!) 88%  Weight: 88.8 kg     Height:        Intake/Output Summary (Last 24 hours) at 01/26/2020 1015 Last data filed at 01/13/2020 0423 Gross per 24 hour  Intake 992.49 ml  Output 1150 ml  Net -157.51 ml   Filed Weights   01/16/20 0322  01/17/20 0439 01/30/2020 0630  Weight: 89.1 kg 90 kg 88.8 kg    Examination: General exam: Appears calm, fatigued Respiratory system: Diminished breath sounds, on BiPAP Cardiovascular system: S1 & S2 heard, RRR. No pedal edema. Gastrointestinal system: Abdomen is nondistended, soft and nontender. Normal bowel sounds heard. Central nervous system: Alert and oriented. Non focal exam. Speech clear  Extremities: Symmetric in appearance bilaterally  Skin: No rashes, lesions or ulcers on exposed skin  Psychiatry: Judgement and insight appear stable. Mood & affect appropriate.    Data Reviewed: I have personally reviewed following labs and imaging studies  CBC: Recent Labs  Lab 01/17/20 2058 01/05/2020 0032  WBC 12.3* 12.3*  NEUTROABS 11.0*  --   HGB 9.7* 9.6*  HCT 34.7* 33.7*  MCV 90.6 89.6  PLT 117* 114*   Basic Metabolic Panel: Recent Labs  Lab 01/16/20 0557 01/17/20 0113 01/17/20 1449 01/17/20 2058 01/25/2020 0032  NA 145 141 139 142 142  K 4.0 3.6 3.3* 3.0* 4.0  CL 94* 93* 95* 94* 95*  CO2 37* 36* 35* 39* 36*  GLUCOSE 206* 161* 271* 169* 173*  BUN 49* 51* 48* 48* 46*  CREATININE 1.41* 1.29* 1.28* 1.19* 1.19*  CALCIUM 10.0 10.0 9.8 9.8 9.8  MG  --  2.0 1.9  --  2.6*   GFR: Estimated Creatinine Clearance: 42.1 mL/min (A) (by C-G formula based on SCr of 1.19 mg/dL (H)). Liver Function Tests: Recent Labs  Lab 01/17/20 0113 01/17/20 2058  AST 29 27  ALT 18 20  ALKPHOS 41 42  BILITOT 0.7 0.9  PROT 6.4* 6.5  ALBUMIN 3.7 4.1   Recent Labs  Lab 01/17/20 2058  LIPASE 32   No results for input(s): AMMONIA in the last 168 hours. Coagulation Profile: No results for input(s): INR, PROTIME in the last 168 hours. Cardiac Enzymes: No results for input(s): CKTOTAL, CKMB, CKMBINDEX, TROPONINI in the last 168 hours. BNP (last 3 results) No results for input(s): PROBNP in the last 8760 hours. HbA1C: No results for input(s): HGBA1C in the last 72 hours. CBG: Recent Labs   Lab 01/17/20 0606 01/17/20 1101 01/17/20 1551 01/17/20 2048 01/22/2020 0622  GLUCAP 183* 226* 256* 166* 194*   Lipid Profile: No results for input(s): CHOL, HDL, LDLCALC, TRIG, CHOLHDL, LDLDIRECT in the last 72 hours. Thyroid Function Tests: Recent Labs    01/17/20 2058  TSH 0.731   Anemia Panel: No results for input(s): VITAMINB12, FOLATE, FERRITIN, TIBC, IRON, RETICCTPCT in the last 72 hours. Sepsis Labs: Recent Labs  Lab 01/23/2020 0036  PROCALCITON <0.10  LATICACIDVEN 1.1    Recent Results (from the past 240 hour(s))  Respiratory Panel by RT PCR (Flu A&B, Covid) - Nasopharyngeal Swab     Status: None   Collection Time: 01/23/20  9:12 PM   Specimen: Nasopharyngeal Swab  Result Value Ref Range Status   SARS Coronavirus 2 by RT PCR NEGATIVE NEGATIVE Final    Comment: (NOTE) SARS-CoV-2 target nucleic acids are NOT DETECTED.  The SARS-CoV-2 RNA is generally detectable in upper respiratoy specimens  during the acute phase of infection. The lowest concentration of SARS-CoV-2 viral copies this assay can detect is 131 copies/mL. A negative result does not preclude SARS-Cov-2 infection and should not be used as the sole basis for treatment or other patient management decisions. A negative result may occur with  improper specimen collection/handling, submission of specimen other than nasopharyngeal swab, presence of viral mutation(s) within the areas targeted by this assay, and inadequate number of viral copies (<131 copies/mL). A negative result must be combined with clinical observations, patient history, and epidemiological information. The expected result is Negative.  Fact Sheet for Patients:  https://www.moore.com/  Fact Sheet for Healthcare Providers:  https://www.young.biz/  This test is no t yet approved or cleared by the [place] FDA and  has been authorized for detection and/or diagnosis of SARS-CoV-2 by FDA under an  Emergency Use Authorization (EUA). This EUA will remain  in effect (meaning this test can be used) for the duration of the COVID-19 declaration under Section 564(b)(1) of the Act, 21 U.S.C. section 360bbb-3(b)(1), unless the authorization is terminated or revoked sooner.     Influenza A by PCR NEGATIVE NEGATIVE Final   Influenza B by PCR NEGATIVE NEGATIVE Final    Comment: (NOTE) The Xpert Xpress SARS-CoV-2/FLU/RSV assay is intended as an aid in  the diagnosis of influenza from Nasopharyngeal swab specimens and  should not be used as a sole basis for treatment. Nasal washings and  aspirates are unacceptable for Xpert Xpress SARS-CoV-2/FLU/RSV  testing.  Fact Sheet for Patients: https://www.moore.com/  Fact Sheet for Healthcare Providers: https://www.young.biz/  This test is not yet approved or cleared by the [place] FDA and  has been authorized for detection and/or diagnosis of SARS-CoV-2 by  FDA under an Emergency Use Authorization (EUA). This EUA will remain  in effect (meaning this test can be used) for the duration of the  Covid-19 declaration under Section 564(b)(1) of the Act, 21  U.S.C. section 360bbb-3(b)(1), unless the authorization is  terminated or revoked. Performed at [facility], [address], [place], [place] [address]   MRSA PCR Screening     Status: None   Collection Time: 01/10/20 12:58 AM   Specimen: Nasal Mucosa; Nasopharyngeal  Result Value Ref Range Status   MRSA by PCR NEGATIVE NEGATIVE Final    Comment:        The GeneXpert MRSA Assay (FDA approved for NASAL specimens only), is one component of a comprehensive MRSA colonization surveillance program. It is not intended to diagnose MRSA infection nor to guide or monitor treatment for MRSA infections. Performed at [facility], [address] [place], [place] [address]   C Difficile Quick Screen w PCR reflex     Status: None   Collection  Time: 01/12/20  5:02 AM   Specimen: STOOL  Result Value Ref Range Status   C Diff antigen NEGATIVE NEGATIVE Final   C Diff toxin NEGATIVE NEGATIVE Final   C Diff interpretation No C. difficile detected.  Final    Comment: Performed at [facility], [address] [place], [place] [address]  Gastrointestinal Panel by PCR , Stool     Status: None   Collection Time: 01/12/20  5:02 AM   Specimen: STOOL  Result Value Ref Range Status   Campylobacter species NOT DETECTED NOT DETECTED Final   Plesimonas shigelloides NOT DETECTED NOT DETECTED Final   Salmonella species NOT DETECTED NOT DETECTED Final   Yersinia enterocolitica NOT DETECTED NOT DETECTED  Final   Vibrio species NOT DETECTED NOT DETECTED Final   Vibrio cholerae NOT DETECTED NOT DETECTED Final   Enteroaggregative E coli (EAEC) NOT DETECTED NOT DETECTED Final   Enteropathogenic E coli (EPEC) NOT DETECTED NOT DETECTED Final   Enterotoxigenic E coli (ETEC) NOT DETECTED NOT DETECTED Final   Shiga like toxin producing E coli (STEC) NOT DETECTED NOT DETECTED Final   Shigella/Enteroinvasive E coli (EIEC) NOT DETECTED NOT DETECTED Final   Cryptosporidium NOT DETECTED NOT DETECTED Final   Cyclospora cayetanensis NOT DETECTED NOT DETECTED Final   Entamoeba histolytica NOT DETECTED NOT DETECTED Final   Giardia lamblia NOT DETECTED NOT DETECTED Final   Adenovirus F40/41 NOT DETECTED NOT DETECTED Final   Astrovirus NOT DETECTED NOT DETECTED Final   Norovirus GI/GII NOT DETECTED NOT DETECTED Final   Rotavirus A NOT DETECTED NOT DETECTED Final   Sapovirus (I, II, IV, and V) NOT DETECTED NOT DETECTED Final    Comment: Performed at [facility], [address] [place], [place] [address]      Radiology Studies: DG Chest Port 1 View  Result Date: 05/11/2019 CLINICAL DATA:  74 year old female with possible sepsis. EXAM: PORTABLE CHEST 1 VIEW COMPARISON:  Portable chest 01/15/2020 and earlier. Chest CT 01/13/2020. FINDINGS:  Portable AP semi upright view at 2308 hours. Chronic cardiomegaly. Mildly rotated to the right. Stable left chest cardiac pacemaker. Sequelae of CABG. No pneumothorax or pulmonary edema. Chronic hypo ventilation at the right lung base. Small bilateral pleural effusions were better demonstrated on the recent CT. No areas of worsening ventilation. Paucity of bowel gas in the upper abdomen. No acute osseous abnormality identified. IMPRESSION: 1. Cardiomegaly. Small pleural effusions better demonstrated on recent CT. 2. No new cardiopulmonary abnormality. Electronically Signed   By: Odessa FlemingH  Hall M.D.   On: 05/11/2019 03:50      Scheduled Meds: . atorvastatin  20 mg Oral QPM  . budesonide (PULMICORT) nebulizer solution  0.25 mg Nebulization BID  . citalopram  40 mg Oral Daily  . diltiazem  240 mg Oral Daily  . enoxaparin (LOVENOX) injection  40 mg Subcutaneous Q24H  . furosemide  60 mg Intravenous BID  . insulin aspart  0-9 Units Subcutaneous TID WC  . ipratropium-albuterol  3 mL Nebulization TID  . mouth rinse  15 mL Mouth Rinse BID  . melatonin  10 mg Oral QHS  . metoprolol tartrate  100 mg Oral BID  . pantoprazole  40 mg Oral Daily  . [START ON 01/21/2020] predniSONE  10 mg Oral Q breakfast  . [START ON 01/19/2020] predniSONE  20 mg Oral Q breakfast  . sodium chloride flush  3 mL Intravenous Q12H  . sucralfate  1 g Oral TID AC   Continuous Infusions:    LOS: 8 days      Time spent: 40 minutes   Noralee StainJennifer Kaiden Dardis, DO [facility] 05/11/2019, 10:15 AM   Available via Epic secure chat 7am-7pm After these hours, please refer to coverage provider listed on [email or web address]

## 2020-01-31 NOTE — Procedures (Signed)
Extubation Procedure Note  Patient Details:   Name: Gabrielle [name] DOB: 07-30-1945 MRN: [id number]   Airway Documentation:    Vent end date: 2020/01/20 Vent end time: 1504   Evaluation  O2 sats: currently acceptable Complications: No apparent complications Patient did tolerate procedure well. Bilateral Breath Sounds: Rhonchi, Diminished   No, pt could not speak post extubation.  Pt extubated to room air per physician's order and in accordance with the family's wishes.  Family at bedside during extubation.  Audrie Lia 01/20/20, 3:05 PM

## 2020-01-31 NOTE — TOC Initial Note (Signed)
Transition of Care [facility]) - Initial/Assessment Note    Patient Details  Name: Gabrielle [name] MRN: [id number] Date of Birth: 13-Sep-1945  [facility] CM/SW Contact:    Carmina Miller, LCSWA Phone Number: 01/12/2020, 10:08 AM  Clinical Narrative:                 CSW spoke with pt's son Bruce via phone in reference to dc plans for pt. Bruce stated he would have to speak with his family in regards to pt going to SNF. Bruce stated pt has not been vaccinated and would be concerned about not being able to visit his mother while in in rehab. Bruce stated pt lives with her sister and she is a lot younger than pt but he is unsure is pt's ister can handle 24/7 care for pt. Bruce stated pt had been at SNF before, he believes [facility], and if she had to go to SNF, he would prefer that one. Bruce requested time to speak with his family about dc plans and requested to have MD call to get an understanding of what is wrong with pt. CSW sent MD a message to call Bruce. CSW will continue to follow.     Barriers to Discharge: Continued Medical Work up   Patient Goals and CMS Choice Patient states their goals for this hospitalization and ongoing recovery are:: to get better CMS Medicare.gov Compare Post Acute Care list provided to:: Patient Choice offered to / list presented to : Patient  Expected Discharge Plan and Services                           DME Arranged: Walker rolling with seat DME Agency: [employer] Date DME Agency Contacted: 01/10/20 Time DME Agency Contacted: [phone] Representative spoke with at DME Agency: Ian Malkin HH Arranged: PT HH Agency: [facility] Date [facility] Agency Contacted: 01/10/20 Time HH Agency Contacted: 1526 Representative spoke with at [facility] Agency: Amy  Prior Living Arrangements/Services                       Activities of Daily Living Home Assistive Devices/Equipment: Oxygen ADL Screening (condition at time of admission) Patient's  cognitive ability adequate to safely complete daily activities?: Yes Is the patient deaf or have difficulty hearing?: No Does the patient have difficulty seeing, even when wearing glasses/contacts?: No Does the patient have difficulty concentrating, remembering, or making decisions?: No Patient able to express need for assistance with ADLs?: Yes Does the patient have difficulty dressing or bathing?: Yes Independently performs ADLs?: No Communication: Independent Dressing (OT): Needs assistance Is this a change from baseline?: Pre-admission baseline Grooming: Independent Feeding: Independent Bathing: Needs assistance Is this a change from baseline?: Pre-admission baseline Toileting: Appropriate for developmental age In/Out Bed: Needs assistance Is this a change from baseline?: Pre-admission baseline Walks in Home: Independent with device (comment) Does the patient have difficulty walking or climbing stairs?: Yes Weakness of Legs: Both Weakness of Arms/Hands: None  Permission Sought/Granted                  Emotional Assessment              Admission diagnosis:  COPD exacerbation (HCC) [J44.1] Acute on chronic respiratory failure with hypoxia (HCC) [J96.21] Congestive heart failure, unspecified HF chronicity, unspecified heart failure type (HCC) [I50.9] Acute respiratory failure (HCC) [J96.00] Patient Active Problem List   Diagnosis Date Noted  . Acute  respiratory failure (HCC) 01/10/2020  . Acute on chronic respiratory failure with hypoxia (HCC) 2020/01/12  . Acute exacerbation of CHF (congestive heart failure) (HCC) 05/16/2019  . Pacemaker 04/06/2019  . SIRS (systemic inflammatory response syndrome) (HCC) 02/21/2019  . Acute metabolic encephalopathy 02/21/2019  . Urinary tract infection without hematuria   . Nausea and vomiting 07/02/2018  . Nausea and/or vomiting 07/01/2018  . Compression fracture of T12 vertebra (HCC)   . Hypoxia   . Chronic diastolic CHF  (congestive heart failure) (HCC)   . Hypoxemia   . Physical deconditioning   . Dementia with behavioral disturbance (HCC)   . H/O: GI bleed   . Protein-calorie malnutrition, severe 05/15/2016  . Encounter for central line care   . Hypokalemia 05/11/2016  . Thrombocytopenia (HCC) 12/25/2015  . SOB (shortness of breath)   . Dysphagia 12/24/2015  . Tachy-brady syndrome (HCC) 12/24/2015  . Fall   . Prolonged Q-T interval on ECG   . Hypoxic Respiratory failure, acute (HCC) 12/16/2015  . Elevated lactic acid level 10/27/2015  . PAF (paroxysmal atrial fibrillation) (HCC) 10/21/2015  . Severe anemia   . Symptomatic anemia   . Coronary artery disease due to lipid rich plaque 02/07/2014  . Obesity 02/07/2014  . Abdominal aortic ectasia (HCC) 09/06/2013  . Encounter for therapeutic drug monitoring 07/13/2013  . Atrial fibrillation (HCC) 12/20/2012  . NICOTINE ADDICTION 04/02/2009  . PARAPSORIASIS 12/15/2008  . Hyperlipidemia 12/14/2008  . HEART ATTACK 12/14/2008  . HEADACHE, CHRONIC 12/14/2008  . Depression 12/13/2008  . 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 COPD exacerbation (HCC) 12/13/2008   PCP:  Henrine Screws, MD Pharmacy:   [facility] [address], [place] - [address] [address] Phone: [phone] Fax: [phone]  [facility] - [address], [place] - [address] [place] [address] Phone: [phone] Fax: [phone]  [facility], [place] - [place], [place] - [address] [place] [address] Phone: [phone] Fax: [phone]     Social Determinants of Health (SDOH) Interventions    Readmission Risk Interventions No flowsheet data found.

## 2020-01-31 NOTE — Procedures (Signed)
Bronchoscopy Procedure Note  LESTA LIMBERT  [id number]  02/10/1946  Date:Feb 17, 2020  Time:12:17 PM   Provider Performing:Angelyne Terwilliger V. Adamariz Gillott   Procedure(s):  Flexible Bronchoscopy [phone])  Indication(s) RLL collapse, hemoptysis, cardiac arrest  Consent Risks of the procedure as well as the alternatives and risks of each were explained to the patient and/or caregiver.  Consent for the procedure was obtained and is signed in the bedside chart  Anesthesia none   Time Out Verified patient identification, verified procedure, site/side was marked, verified correct patient position, special equipment/implants available, medications/allergies/relevant history reviewed, required imaging and test results available.   Sterile Technique Usual hand hygiene, masks, gowns, and gloves were used   Procedure Description Bronchoscope advanced through endotracheal tube and into airway.  Airways were examined down to subsegmental level with findings noted below.   Following diagnostic evaluation, BAL(s) performed in RLL with normal saline and return of clear fluid  Findings: Blood suctioned from proximal airways, distal airways cleared with BAL   Complications/Tolerance None; patient tolerated the procedure well. Chest X-ray is not needed post procedure.   EBL Minimal  Richetta Cubillos V. Elsworth Soho MD

## 2020-01-31 NOTE — Progress Notes (Signed)
NAME:  Gabrielle [name], MRN:  [id number], DOB:  1946/01/22, LOS: 8 ADMISSION DATE:  2020-01-23, CONSULTATION DATE:  01/15/2020 REFERRING MD: Dr. Sharl Ma, CHIEF COMPLAINT: Acute hypercapnic respiratory failure  Brief History   74 year old female presented 11/9 for progressive dyspnea.  Known history of hypoxic respiratory failure on chronic supplemental oxygen of 2 to 4 L, COPD, and current everyday smoker.  During admission patient has been treated for acute COPD exasperation and questionable diastolic congestive heart failure exasperation.  She  received increased supplemental oxygen, bronchodilators, and IV steroids with little improvement in dyspnea.  Underwent high-resolution CT scan chest to look for underlying ILD, this was not identified. PCCM consulted for further assistance in management. She was on and off BiPAP , chest x-ray 11/17 shows right lower lobe atelectasis On 11/18 she developed shortness of breath, has to go back on BiPAP, then found to be PEA arrest , required 10 minutes of ACLS before ROSC  Past Medical History  HTN HLD MI with CAD and CABG x7 COPD  Chronic hypoxic and hypercapnic respiratory failure  Chronic A-fib ARF HFpEF  Significant Hospital Events   Admitted 11/9  Consults:  Cardiology  Pulmonary   Procedures:    Significant Diagnostic Tests:  High resolution CT chest 11/15 > several areas of scaring likely related to prior infection, no ILD. Pulmonary artery hypertension.   ECHO 11/10 > EF 55-60% with no observed diastolic dysfunction   Micro Data:  COVID 11/09 > negative  MRSA PCR 11/12 > negative  C-diff 11/12 > negative  GI panel 11/12 > negative   Antimicrobials:    Interim history/subjective:  Brought into ICU after PEA cardiac arrest   Objective   Blood pressure (!) 124/36, pulse 79, temperature (!) 96.3 F (35.7 C), temperature source Rectal, resp. rate (!) 21, height 5\' 2"  (1.575 m), weight 88.8 kg, SpO2 (!) 74 %.    Vent  Mode: PRVC FiO2 (%):  [70 %-100 %] 100 % Set Rate:  [20 bmp] 20 bmp Vt Set:  [400 mL] 400 mL PEEP:  [8 cmH20-10 cmH20] 10 cmH20 Plateau Pressure:  [31 cmH20] 31 cmH20   Intake/Output Summary (Last 24 hours) at 01/22/2020 1349 Last data filed at 01/03/2020 0423 Gross per 24 hour  Intake 992.49 ml  Output 1150 ml  Net -157.51 ml   Filed Weights   01/16/20 0322 01/17/20 0439 01/17/2020 0630  Weight: 89.1 kg 90 kg 88.8 kg   Examination: Gen:      elderly woman, no distress  HEENT:  EOMI, sclera anicteric, mild pallor Neck:     No JVD; no thyromegaly Lungs:    Decreased breath sounds bilateral, bilateral scattered rhonchi CV:         Regular rate and rhythm; no murmurs Abd:      + bowel sounds; soft, non-tender; no palpable masses, no distension Ext:    No edema; adequate peripheral perfusion Skin:      Warm and dry; no rash Neuro: Comatose, left pupil 2 mm, right pinpoint not reactive to light intermittent agitation nonpurposeful  Chest x-ray 11/18 independently reviewed shows mild improvement in right lower lobe aeration, ET tube in position no new infiltrates or effusions   Resolved Hospital Problem list     Assessment & Plan:  PEA cardiac arrest -unclear cause, ABG shows worsening respiratory acidosis and this could have been the likely cause, bedside echo showed mildly dilated RV and global hypokinesis which may be related to postarrest, note that previous echo from  11/10 showed normal RV function , so PE may be a possibility. Emergent bedside bronchoscopy performed today hemoptysis and previous chest x-ray showing right lower lobe atelectasis however no significant secretions noted -We will consider starting IV heparin and obtaining duplex for completion, doubt we have enough evidence to give TPA  Acute on chronic hypoxic and hypercapnic respiratory failure  In setting of COPD and baseline O2 requirement of 2-4L via [place] along with acute on chronic diastolic heart failure and  atrial fibrillation with signs volume overload that include lower extremity edema and bilateral pleural effusions. She also has atelectasis based on CXR 11/17. Lower extremity duplex US is negative for DVT.    -Ventilator settings were reviewed and adjusted. -Add PEEP of 10, RR 20, avoid auto PEEP  Acute on chronic diastolic heart failure -on diuresis, weight had decreased to 195 pounds from 198 -Hold diuretics for now, appears dry on bedside echo  Persistent atrial fibrillation -rate controlled, Given hypotension diltiazem and metoprolol will be held  Goals of care -I informed family including son, daughter and her twin sister [name] about events leading to cardiac arrest and her poor prognosis.  Given persistent hypoxia , unable to improve sats above 60%, her quality of life had been very poor prior to this episode and they elected to forego further resuscitation.  DNR issued we will continue current life support measures but not escalate  Best practice:  Diet: Heart healthy  Pain/Anxiety/Delirium protocol (if indicated): PRN's VAP protocol (if indicated): n/a DVT prophylaxis: Lovenox  GI prophylaxis: PPI Glucose control: SSI Mobility: Up with assistance  Code Status: DNR Family Communication: Family updated at bedside  Disposition: ICU   Labs   CBC: Recent Labs  Lab 01/17/20 2058 01/08/2020 0032 01/04/2020 1201  WBC 12.3* 12.3*  --   NEUTROABS 11.0*  --   --   HGB 9.7* 9.6* 10.5*  HCT 34.7* 33.7* 31.0*  MCV 90.6 89.6  --   PLT 117* 114*  --     Basic Metabolic Panel: Recent Labs  Lab 01/16/20 0557 01/16/20 0557 01/17/20 0113 01/17/20 1449 01/17/20 2058 01/10/2020 0032 01/03/2020 1201  NA 145   < > 141 139 142 142 141  K 4.0   < > 3.6 3.3* 3.0* 4.0 3.8  CL 94*  --  93* 95* 94* 95*  --   CO2 37*  --  36* 35* 39* 36*  --   GLUCOSE 206*  --  161* 271* 169* 173*  --   BUN 49*  --  51* 48* 48* 46*  --   CREATININE 1.41*  --  1.29* 1.28* 1.19* 1.19*  --   CALCIUM 10.0  --   10.0 9.8 9.8 9.8  --   MG  --   --  2.0 1.9  --  2.6*  --    < > = values in this interval not displayed.   GFR: Estimated Creatinine Clearance: 41.7 mL/min (A) (by C-G formula based on SCr of 1.19 mg/dL (H)). Recent Labs  Lab 01/17/20 2058 01/14/2020 0032 01/01/2020 0036  PROCALCITON  --   --  <0.10  WBC 12.3* 12.3*  --   LATICACIDVEN  --   --  1.1    Liver Function Tests: Recent Labs  Lab 01/17/20 0113 01/17/20 2058  AST 29 27  ALT 18 20  ALKPHOS 41 42  BILITOT 0.7 0.9  PROT 6.4* 6.5  ALBUMIN 3.7 4.1   Recent Labs  Lab 01/17/20 2058  LIPASE 32  No results for input(s): AMMONIA in the last 168 hours.  ABG    Component Value Date/Time   PHART 7.373 01/15/2020 2253   PCO2ART 72.4 (HH) 01/15/2020 2253   PO2ART 53.3 (L) 01/15/2020 2253   HCO3 33.0 (H) 02-06-20 1201   TCO2 36 (H) 02/06/2020 1201   ACIDBASEDEF 1.6 02/22/2019 0916   O2SAT 68.0 02/06/20 1201     Coagulation Profile: No results for input(s): INR, PROTIME in the last 168 hours.  Cardiac Enzymes: No results for input(s): CKTOTAL, CKMB, CKMBINDEX, TROPONINI in the last 168 hours.  HbA1C: Hgb A1c MFr Bld  Date/Time Value Ref Range Status  01/10/2020 04:23 AM 6.0 (H) 4.8 - 5.6 % Final    Comment:    (NOTE) Pre diabetes:          5.7%-6.4%  Diabetes:              >6.4%  Glycemic control for   <7.0% adults with diabetes   05/20/2019 03:18 AM 6.4 (H) 4.8 - 5.6 % Final    Comment:    (NOTE) Pre diabetes:          5.7%-6.4% Diabetes:              >6.4% Glycemic control for   <7.0% adults with diabetes     CBG: Recent Labs  Lab 01/17/20 0606 01/17/20 1101 01/17/20 1551 01/17/20 2048 06-Feb-2020 0622  GLUCAP 183* 226* 256* 166* 194*    Critical care time: 60 minutes.  The treatment and management of the patient's condition was required based on the threat of imminent deterioration. This time reflects time spent by the physician evaluating, providing care and managing the critically  ill patient's care. The time was spent at the immediate bedside (or on the same floor/unit and dedicated to this patient's care). Time involved in separately billable procedures is NOT included int he critical care time indicated above. Family meeting and update time may be included above if and only if the patient is unable/incompetent to participate in clinical interview and/or decision making, and the discussion was necessary to determining treatment decisions   Signature:  Cyril Mourning MD. Tonny Bollman. [facility] See Amion for pager  If no response to pager , please call [phone]  After 7:00 pm call Elink  [phone]   02/06/20

## 2020-01-31 NOTE — Death Summary Note (Signed)
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)   COPD exacerbation (HCC)   Atrial fibrillation (HCC)   DNR (do not resuscitate) discussion   Coronary artery disease due to lipid rich plaque   Tachy-brady syndrome (HCC)   SOB (shortness of breath)   AKI (acute kidney injury) (HCC)   Pacemaker   Acute exacerbation of CHF (congestive heart failure) (HCC)   Acute respiratory failure (HCC)   Cardiac arrest, cause unspecified (HCC)   CKD (chronic kidney disease) stage 3, GFR 30-59 ml/min (HCC)   Prediabetes   Diarrhea   Anxiety   Brief Hospital Course (including significant findings, care, treatment, and services provided and events leading to death)  Gabrielle [name] is a 74 year old female with history of COPD, HFpEF, atrial fibrillation, anemia, QT prolongation, CAD s/p MI and CABG x7, hypertension, tachybradycardia syndrome status post pacemaker presented with progressive dyspnea. Patient usually is on 2 L of oxygen at home for COPD. And was requiring 5 L of oxygen. ABG showed PCO2 of 71, pH 7.32.  Due to her respiratory failure, she eventually required BiPAP. Cardiology and pulmonology consulted.  Patient was managed with medical treatments including Solu-Medrol, breathing treatments for COPD as well as Lasix and metolazone, Diamox for heart failure.  Patient continued to make very little progress.   On morning of 02/14/2023, patient was found in PEA arrest.  She was resuscitated and transferred to ICU on mechanical ventilation.  Later that day, family elected for one-way extubation to full comfort care.    Pertinent Labs and Studies  Significant Diagnostic Studies CT Chest High Resolution  Result Date: 01/15/2020 CLINICAL DATA:  74 year old female with history of respiratory failure. Evaluate for interstitial lung disease. EXAM: CT CHEST WITHOUT CONTRAST TECHNIQUE: Multidetector CT imaging of the chest was performed following the standard protocol without intravenous contrast. High resolution imaging of the lungs, as well as inspiratory and expiratory imaging, was performed. COMPARISON:  Chest CT 05/23/2019. FINDINGS: Cardiovascular: Heart size is mildly enlarged. There is no significant pericardial fluid, thickening or pericardial calcification. There is aortic atherosclerosis, as well as atherosclerosis of the great vessels of the mediastinum and the coronary arteries, including calcified atherosclerotic plaque in the left main, left anterior descending, left circumflex and right coronary arteries. Status post median sternotomy for CABG including LIMA to the LAD. Left-sided pacemaker in place with lead tips terminating in the right atrial appendage and right ventricular apex. Moderate to severe calcifications of the mitral annulus. Dilatation of the pulmonic trunk (3.9 cm in diameter). Mediastinum/Nodes: No pathologically enlarged mediastinal or hilar lymph nodes. Please note that accurate exclusion of hilar adenopathy is limited on noncontrast CT scans. Esophagus is unremarkable in appearance. No axillary lymphadenopathy. Lungs/Pleura: Several areas of chronic scarring and volume loss are noted in the lungs bilaterally, as well as the anterior aspects of the upper lobes of the lungs bilaterally, very similar to prior study from 05/23/2019. No generalized areas of ground-glass  attenuation, septal  thickening, subpleural reticulation, traction bronchiectasis or honeycombing are noted to clearly indicate underlying interstitial lung disease. Inspiratory and expiratory imaging is unremarkable. Trace right and small left pleural effusions are noted lying dependently. No definite suspicious appearing pulmonary nodules or masses are noted. Upper Abdomen: Aortic atherosclerosis. Trace amount of ascites noted adjacent to the liver. Visualized portions of the liver have a nodular appearance and shrunken contour, suggesting underlying cirrhosis. Musculoskeletal: There are no aggressive appearing lytic or blastic lesions noted in the visualized portions of the skeleton. Median sternotomy wires. Chronic compression fractures of T9, T12 and superior endplate of L3, most severe at T12 where there is 90% loss of central vertebral body height, similar to the prior examination. IMPRESSION: 1. While there are several areas of mild chronic scarring scattered throughout the lungs bilaterally, this is presumably related to prior infection. No definitive imaging findings to suggest interstitial lung disease at this time. 2. Trace right and small left pleural effusions lying dependently. 3. Cardiomegaly. 4. Dilatation of the pulmonic trunk (3.9 cm in diameter), concerning for pulmonary arterial hypertension. 5. Aortic atherosclerosis, in addition to left main and 3 vessel coronary artery disease. Status post median sternotomy for CABG including LIMA to the LAD. 6. Morphologic changes in the liver indicative of underlying cirrhosis. Trace amount of perihepatic ascites. Aortic Atherosclerosis (ICD10-I70.0). Electronically Signed   By: Trudie Reedaniel  Entrikin M.D.   On: 01/15/2020 08:09   DG CHEST PORT 1 VIEW  Result Date: 09-05-19 CLINICAL DATA:  Intubated. Status post CPR. Orogastric tube placed. EXAM: PORTABLE CHEST 1 VIEW COMPARISON:  Yesterday. FINDINGS: Stable enlarged cardiac silhouette, post CABG changes and left subclavian  pacemaker leads. Mild increase in diffuse prominence of the interstitial markings with some Kerley lines. Minimal bilateral pleural fluid without significant change. Patchy airspace opacity in the right lower lung zone with mild improvement. Mild patchy airspace opacity at the left lung base, mildly increased. Unremarkable bones. IMPRESSION: 1. Mildly improved right basilar atelectasis or pneumonia. 2. Mildly increased mild left basilar atelectasis, pneumonia or alveolar edema. 3. Interval mild changes of acute congestive heart failure. Electronically Signed   By: Beckie SaltsSteven  Reid M.D.   On: 09-05-19 11:20   DG Chest Port 1 View  Result Date: 09-05-19 CLINICAL DATA:  74 year old female with possible sepsis. EXAM: PORTABLE CHEST 1 VIEW COMPARISON:  Portable chest 01/15/2020 and earlier. Chest CT 01/13/2020. FINDINGS: Portable AP semi upright view at 2308 hours. Chronic cardiomegaly. Mildly rotated to the right. Stable left chest cardiac pacemaker. Sequelae of CABG. No pneumothorax or pulmonary edema. Chronic hypo ventilation at the right lung base. Small bilateral pleural effusions were better demonstrated on the recent CT. No areas of worsening ventilation. Paucity of bowel gas in the upper abdomen. No acute osseous abnormality identified. IMPRESSION: 1. Cardiomegaly. Small pleural effusions better demonstrated on recent CT. 2. No new cardiopulmonary abnormality. Electronically Signed   By: Odessa FlemingH  Hall M.D.   On: 09-05-19 03:50   DG CHEST PORT 1 VIEW  Result Date: 01/16/2020 CLINICAL DATA:  Hypoxia EXAM: PORTABLE CHEST 1 VIEW COMPARISON:  Radiograph 01/26/2020, CT 01/13/2020 FINDINGS: Bilateral effusions are present, right greater than left. These may be slightly increased from the comparison CT 01/13/2020. The adjacent opacity which likely reflect a degree of passive atelectasis though underlying airspace disease is difficult to exclude. Additional septal and fissural thickening and hazy interstitial  opacity with indistinct vascularity/vascular congestion. More bandlike areas of chronic scarring and architectural distortion are noted in the lungs bilaterally as well.  Pacer pack overlies left chest wall with leads in stable position directed towards the cardiac apex and right atrium. Postsurgical changes related to prior CABG including intact and aligned sternotomy wires and multiple surgical clips projecting over the mediastinum. The aorta is calcified. The remaining cardiomediastinal contours are unremarkable. No acute osseous or soft tissue abnormality. Degenerative changes are present in the imaged spine and shoulders. IMPRESSION: 1. Bilateral effusions, right greater than left. These may be slightly increased from the comparison CT 01/13/2020. 2. Adjacent opacity is likely some passive atelectatic changes of underlying infection is difficult to exclude. 3. Additional background features suggest CHF/volume overload with vascular congestion and cardiomegaly. Electronically Signed   By: Kreg Shropshire M.D.   On: 01/16/2020 00:24   DG Chest Port 1 View  Result Date: 02-08-20 CLINICAL DATA:  Shortness of breath EXAM: PORTABLE CHEST 1 VIEW COMPARISON:  Chest radiograph May 16, 2019; chest CT May 23, 2019 FINDINGS: There is atelectatic change in the mid lung and bibasilar regions. There is no edema or airspace opacity. There is cardiomegaly with pulmonary vascularity normal. Pacemaker leads are attached to the right atrium and right ventricle. Patient is status post coronary artery bypass grafting. There is aortic atherosclerosis. No adenopathy. No bone lesions. IMPRESSION: Areas of patchy atelectasis. No edema or airspace opacity. Stable cardiomegaly. Pacemaker leads attached to right atrium and right ventricle. Status post coronary artery bypass grafting. Aortic Atherosclerosis (ICD10-I70.0). Electronically Signed   By: Bretta Bang III M.D.   On: 02-08-20 17:38   ECHOCARDIOGRAM  COMPLETE  Result Date: 01/10/2020    ECHOCARDIOGRAM REPORT   Patient Name:   KIAN GAMARRA Date of Exam: 01/10/2020 Medical Rec #:  [id number]          Height:       62.5 in Accession #:    [id number]         Weight:       201.7 lb Date of Birth:  18-Jan-1946          BSA:          1.930 m Patient Age:    74 years           BP:           128/124 mmHg Patient Gender: F                  HR:           93 bpm. Exam Location:  Inpatient Procedure: 2D Echo Indications:    786.09 dyspnea  History:        Patient has prior history of Echocardiogram examinations, most                 recent 02/22/2019. CHF, Previous Myocardial Infarction, Prior                 CABG, Arrythmias:Atrial Fibrillation; Risk Factors:Hypertension,                 Dyslipidemia and Current Smoker.  Sonographer:    Celene Skeen RDCS (AE) Referring Phys: [id number] Synetta Fail  Sonographer Comments: Image acquisition challenging due to patient body habitus, Image acquisition challenging due to COPD and Image acquisition challenging due to respiratory motion. see comments IMPRESSIONS  1. Left ventricular ejection fraction, by estimation, is 55 to 60%. The left ventricle has normal function. The left ventricle has no regional wall motion abnormalities. Left ventricular diastolic parameters are indeterminate.  2. Right ventricular systolic function is normal. The  right ventricular size is normal.  3. Left atrial size was mildly dilated.  4. The mitral valve is normal in structure. Mild mitral valve regurgitation. No evidence of mitral stenosis. Severe mitral annular calcification.  5. The aortic valve is normal in structure. Aortic valve regurgitation is not visualized. No aortic stenosis is present.  6. The inferior vena cava is dilated in size with <50% respiratory variability, suggesting right atrial pressure of 15 mmHg. Comparison(s): Prior images reviewed side by side. Prior EF 50%. FINDINGS  Left Ventricle: Left ventricular ejection  fraction, by estimation, is 55 to 60%. The left ventricle has normal function. The left ventricle has no regional wall motion abnormalities. The left ventricular internal cavity size was normal in size. There is  no left ventricular hypertrophy. Left ventricular diastolic parameters are indeterminate. Right Ventricle: The right ventricular size is normal. No increase in right ventricular wall thickness. Right ventricular systolic function is normal. Left Atrium: Left atrial size was mildly dilated. Right Atrium: Right atrial size was normal in size. Pericardium: There is no evidence of pericardial effusion. Mitral Valve: The mitral valve is normal in structure. Severe mitral annular calcification. Mild mitral valve regurgitation. No evidence of mitral valve stenosis. Tricuspid Valve: The tricuspid valve is normal in structure. Tricuspid valve regurgitation is mild . No evidence of tricuspid stenosis. Aortic Valve: The aortic valve is normal in structure. Aortic valve regurgitation is not visualized. No aortic stenosis is present. Pulmonic Valve: The pulmonic valve was normal in structure. Pulmonic valve regurgitation is trivial. No evidence of pulmonic stenosis. Aorta: The aortic root is normal in size and structure. Venous: The inferior vena cava is dilated in size with less than 50% respiratory variability, suggesting right atrial pressure of 15 mmHg. IAS/Shunts: No atrial level shunt detected by color flow Doppler.  LEFT VENTRICLE PLAX 2D LVIDd:         4.80 cm LVIDs:         3.50 cm LV PW:         1.10 cm LV IVS:        1.00 cm LVOT diam:     1.80 cm LV SV:         44 LV SV Index:   23 LVOT Area:     2.54 cm  LEFT ATRIUM         Index LA diam:    4.30 cm 2.23 cm/m  AORTIC VALVE LVOT Vmax:   101.00 cm/s LVOT Vmean:  75.900 cm/s LVOT VTI:    0.172 m  AORTA Ao Root diam: 2.90 cm MITRAL VALVE                TRICUSPID VALVE MV Area (PHT): 2.42 cm     TR Peak grad:   31.6 mmHg MV Decel Time: 313 msec     TR Vmax:         281.00 cm/s MV E velocity: 131.00 cm/s                             SHUNTS                             Systemic VTI:  0.17 m                             Systemic Diam: 1.80 cm Donato Schultz MD Electronically  signed by Donato Schultz MD Signature Date/Time: 01/10/2020/2:42:27 PM    Final    VAS [place] LOWER EXTREMITY VENOUS (DVT)  Result Date: 01/15/2020  Lower Venous DVT Study Indications: Dyspnea, hypoxia.  Limitations: Body habitus and poor ultrasound/tissue interface. Comparison Study: No prior study Performing Technologist: Gertie Fey MHA, RDMS, RVT, RDCS  Examination Guidelines: A complete evaluation includes B-mode imaging, spectral Doppler, color Doppler, and power Doppler as needed of all accessible portions of each vessel. Bilateral testing is considered an integral part of a complete examination. Limited examinations for reoccurring indications may be performed as noted. The reflux portion of the exam is performed with the patient in reverse Trendelenburg.  +---------+---------------+---------+-----------+----------+--------------+ RIGHT    CompressibilityPhasicitySpontaneityPropertiesThrombus Aging +---------+---------------+---------+-----------+----------+--------------+ CFV      Full           No       Yes                                 +---------+---------------+---------+-----------+----------+--------------+ SFJ      Full                                                        +---------+---------------+---------+-----------+----------+--------------+ FV Prox  Full                                                        +---------+---------------+---------+-----------+----------+--------------+ FV Mid   Full                                                        +---------+---------------+---------+-----------+----------+--------------+ FV DistalFull                                                         +---------+---------------+---------+-----------+----------+--------------+ PFV      Full                                                        +---------+---------------+---------+-----------+----------+--------------+ POP      Full           No       Yes                                 +---------+---------------+---------+-----------+----------+--------------+ PTV      Full                                                        +---------+---------------+---------+-----------+----------+--------------+  PERO     Full                                                        +---------+---------------+---------+-----------+----------+--------------+ Limited evaluation of calf veins.  +---------+---------------+---------+-----------+----------+--------------+ LEFT     CompressibilityPhasicitySpontaneityPropertiesThrombus Aging +---------+---------------+---------+-----------+----------+--------------+ CFV      Full           No       Yes                                 +---------+---------------+---------+-----------+----------+--------------+ SFJ      Full                                                        +---------+---------------+---------+-----------+----------+--------------+ FV Prox  Full                                                        +---------+---------------+---------+-----------+----------+--------------+ FV Mid   Full                                                        +---------+---------------+---------+-----------+----------+--------------+ FV DistalFull                                                        +---------+---------------+---------+-----------+----------+--------------+ PFV      Full                                                        +---------+---------------+---------+-----------+----------+--------------+ POP      Full           No       Yes                                  +---------+---------------+---------+-----------+----------+--------------+ PTV      Full                                                        +---------+---------------+---------+-----------+----------+--------------+ PERO     Full                                                        +---------+---------------+---------+-----------+----------+--------------+  Limited evaluation of calf veins    Summary: RIGHT: - There is no evidence of deep vein thrombosis in the lower extremity. However, portions of this examination were limited- see technologist comments above.  - No cystic structure found in the popliteal fossa.  LEFT: - There is no evidence of deep vein thrombosis in the lower extremity. However, portions of this examination were limited- see technologist comments above.  - No cystic structure found in the popliteal fossa.  Bilateral lower extremity venous flow is pulsatile, suggestive of possibly elevated right heart pressure.  *See table(s) above for measurements and observations. Electronically signed by Lemar Livings MD on 01/15/2020 at 4:29:34 PM.    Final     Microbiology Recent Results (from the past 240 hour(s))  Respiratory Panel by RT PCR (Flu A&B, Covid) - Nasopharyngeal Swab     Status: None   Collection Time: 01/17/2020  9:12 PM   Specimen: Nasopharyngeal Swab  Result Value Ref Range Status   SARS Coronavirus 2 by RT PCR NEGATIVE NEGATIVE Final    Comment: (NOTE) SARS-CoV-2 target nucleic acids are NOT DETECTED.  The SARS-CoV-2 RNA is generally detectable in upper respiratoy specimens during the acute phase of infection. The lowest concentration of SARS-CoV-2 viral copies this assay can detect is 131 copies/mL. A negative result does not preclude SARS-Cov-2 infection and should not be used as the sole basis for treatment or other patient management decisions. A negative result may occur with  improper specimen collection/handling, submission of specimen other than  nasopharyngeal swab, presence of viral mutation(s) within the areas targeted by this assay, and inadequate number of viral copies (<131 copies/mL). A negative result must be combined with clinical observations, patient history, and epidemiological information. The expected result is Negative.  Fact Sheet for Patients:  https://www.moore.com/  Fact Sheet for Healthcare Providers:  https://www.young.biz/  This test is no t yet approved or cleared by the [place] FDA and  has been authorized for detection and/or diagnosis of SARS-CoV-2 by FDA under an Emergency Use Authorization (EUA). This EUA will remain  in effect (meaning this test can be used) for the duration of the COVID-19 declaration under Section 564(b)(1) of the Act, 21 U.S.C. section 360bbb-3(b)(1), unless the authorization is terminated or revoked sooner.     Influenza A by PCR NEGATIVE NEGATIVE Final   Influenza B by PCR NEGATIVE NEGATIVE Final    Comment: (NOTE) The Xpert Xpress SARS-CoV-2/FLU/RSV assay is intended as an aid in  the diagnosis of influenza from Nasopharyngeal swab specimens and  should not be used as a sole basis for treatment. Nasal washings and  aspirates are unacceptable for Xpert Xpress SARS-CoV-2/FLU/RSV  testing.  Fact Sheet for Patients: https://www.moore.com/  Fact Sheet for Healthcare Providers: https://www.young.biz/  This test is not yet approved or cleared by the [place] FDA and  has been authorized for detection and/or diagnosis of SARS-CoV-2 by  FDA under an Emergency Use Authorization (EUA). This EUA will remain  in effect (meaning this test can be used) for the duration of the  Covid-19 declaration under Section 564(b)(1) of the Act, 21  U.S.C. section 360bbb-3(b)(1), unless the authorization is  terminated or revoked. Performed at [facility], [address], [place],  [place] [address]   MRSA PCR Screening     Status: None   Collection Time: 01/10/20 12:58 AM   Specimen: Nasal Mucosa; Nasopharyngeal  Result Value Ref Range Status   MRSA by PCR NEGATIVE NEGATIVE Final  Comment:        The GeneXpert MRSA Assay (FDA approved for NASAL specimens only), is one component of a comprehensive MRSA colonization surveillance program. It is not intended to diagnose MRSA infection nor to guide or monitor treatment for MRSA infections. Performed at [facility], [address], [place], [place] [address]   C Difficile Quick Screen w PCR reflex     Status: None   Collection Time: 01/12/20  5:02 AM   Specimen: STOOL  Result Value Ref Range Status   C Diff antigen NEGATIVE NEGATIVE Final   C Diff toxin NEGATIVE NEGATIVE Final   C Diff interpretation No C. difficile detected.  Final    Comment: Performed at [facility], [address], [place], [place] [address]  Gastrointestinal Panel by PCR , Stool     Status: None   Collection Time: 01/12/20  5:02 AM   Specimen: STOOL  Result Value Ref Range Status   Campylobacter species NOT DETECTED NOT DETECTED Final   Plesimonas shigelloides NOT DETECTED NOT DETECTED Final   Salmonella species NOT DETECTED NOT DETECTED Final   Yersinia enterocolitica NOT DETECTED NOT DETECTED Final   Vibrio species NOT DETECTED NOT DETECTED Final   Vibrio cholerae NOT DETECTED NOT DETECTED Final   Enteroaggregative E coli (EAEC) NOT DETECTED NOT DETECTED Final   Enteropathogenic E coli (EPEC) NOT DETECTED NOT DETECTED Final   Enterotoxigenic E coli (ETEC) NOT DETECTED NOT DETECTED Final   Shiga like toxin producing E coli (STEC) NOT DETECTED NOT DETECTED Final   Shigella/Enteroinvasive E coli (EIEC) NOT DETECTED NOT DETECTED Final   Cryptosporidium NOT DETECTED NOT DETECTED Final   Cyclospora cayetanensis NOT DETECTED NOT DETECTED Final   Entamoeba histolytica NOT DETECTED NOT DETECTED Final   Giardia lamblia NOT DETECTED  NOT DETECTED Final   Adenovirus F40/41 NOT DETECTED NOT DETECTED Final   Astrovirus NOT DETECTED NOT DETECTED Final   Norovirus GI/GII NOT DETECTED NOT DETECTED Final   Rotavirus A NOT DETECTED NOT DETECTED Final   Sapovirus (I, II, IV, and V) NOT DETECTED NOT DETECTED Final    Comment: Performed at [facility], [address] [place], [place] [address]  Culture, blood (x 2)     Status: None (Preliminary result)   Collection Time: 01/20/2020 12:36 AM   Specimen: BLOOD LEFT HAND  Result Value Ref Range Status   Specimen Description BLOOD LEFT HAND  Final   Special Requests   Final    BOTTLES DRAWN AEROBIC ONLY Blood Culture adequate volume   Culture   Final    NO GROWTH 1 DAY Performed at [facility], [address] [place], [place] [address]    Report Status PENDING  Incomplete  Culture, blood (x 2)     Status: None (Preliminary result)   Collection Time: 01/30/2020 12:36 AM   Specimen: BLOOD LEFT HAND  Result Value Ref Range Status   Specimen Description BLOOD LEFT HAND  Final   Special Requests   Final    BOTTLES DRAWN AEROBIC ONLY Blood Culture adequate volume   Culture   Final    NO GROWTH 1 DAY Performed at [facility], [address] [place], [place] [address]    Report Status PENDING  Incomplete  Culture, Urine     Status: Abnormal   Collection Time: 01/20/2020 12:40 AM   Specimen: Urine, Random  Result Value Ref Range Status   Specimen Description URINE, RANDOM  Final  Special Requests NONE  Final   Culture (A)  Final    <10,000 COLONIES/mL INSIGNIFICANT GROWTH Performed at [facility], [address] [place], [place] [address]    Report Status 01/30/2020 FINAL  Final    Lab Basic Metabolic Panel: Recent Labs  Lab 01/16/20 0557 01/16/20 0557 01/17/20 0113 01/17/20 1449 01/17/20 2058 01-30-2020 0032 01/30/20 1201  NA 145   < > 141 139 142 142 141  K 4.0   < > 3.6 3.3* 3.0* 4.0 3.8  CL 94*  --  93* 95* 94* 95*  --   CO2 37*   --  36* 35* 39* 36*  --   GLUCOSE 206*  --  161* 271* 169* 173*  --   BUN 49*  --  51* 48* 48* 46*  --   CREATININE 1.41*  --  1.29* 1.28* 1.19* 1.19*  --   CALCIUM 10.0  --  10.0 9.8 9.8 9.8  --   MG  --   --  2.0 1.9  --  2.6*  --    < > = values in this interval not displayed.   Liver Function Tests: Recent Labs  Lab 01/17/20 0113 01/17/20 2058  AST 29 27  ALT 18 20  ALKPHOS 41 42  BILITOT 0.7 0.9  PROT 6.4* 6.5  ALBUMIN 3.7 4.1   Recent Labs  Lab 01/17/20 2058  LIPASE 32   No results for input(s): AMMONIA in the last 168 hours. CBC: Recent Labs  Lab 01/17/20 2058 Jan 30, 2020 0032 30-Jan-2020 1201  WBC 12.3* 12.3*  --   NEUTROABS 11.0*  --   --   HGB 9.7* 9.6* 10.5*  HCT 34.7* 33.7* 31.0*  MCV 90.6 89.6  --   PLT 117* 114*  --    Cardiac Enzymes: No results for input(s): CKTOTAL, CKMB, CKMBINDEX, TROPONINI in the last 168 hours. Sepsis Labs: Recent Labs  Lab 01/17/20 2058 01-30-20 0032 01/30/20 0036  PROCALCITON  --   --  <0.10  WBC 12.3* 12.3*  --   LATICACIDVEN  --   --  1.1      Noralee Stain 01/19/2020, 2:32 PM

## 2020-01-31 NOTE — Progress Notes (Signed)
Progress Note  Patient Name: Gabrielle [name] Date of Encounter: 01/04/2020  [facility] Cardiologist: Donato Schultz, MD   Subjective  Feels short of breath this morning and is asking to go back on BiPAP.   Had episode of hypothermia overnight. Lactate and pro-cal normal. WBC increased. Cultures sent.   Remains on and off BiPAP with SpO2 high 80-90s.  Wt down to 195 from 198. Renal function improved to 1.19  Inpatient Medications    Scheduled Meds: . atorvastatin  20 mg Oral QPM  . budesonide (PULMICORT) nebulizer solution  0.25 mg Nebulization BID  . citalopram  40 mg Oral Daily  . diltiazem  240 mg Oral Daily  . enoxaparin (LOVENOX) injection  40 mg Subcutaneous Q24H  . furosemide  60 mg Intravenous BID  . insulin aspart  0-9 Units Subcutaneous TID WC  . ipratropium-albuterol  3 mL Nebulization TID  . mouth rinse  15 mL Mouth Rinse BID  . melatonin  10 mg Oral QHS  . metoprolol tartrate  100 mg Oral BID  . pantoprazole  40 mg Oral Daily  . [START ON 01/21/2020] predniSONE  10 mg Oral Q breakfast  . [START ON 01/19/2020] predniSONE  20 mg Oral Q breakfast  . sodium chloride flush  3 mL Intravenous Q12H  . sucralfate  1 g Oral TID AC   Continuous Infusions:  PRN Meds: acetaminophen **OR** acetaminophen, ALPRAZolam, docusate sodium, sodium chloride   Vital Signs    Vitals:   01/06/2020 0423 01/27/2020 0630 01/25/2020 0709 01/28/2020 0715  BP: 116/76  (!) 136/96   Pulse: 75  78 93  Resp: 19  19 (!) 26  Temp:  (!) 96.3 F (35.7 C)    TempSrc:  Rectal    SpO2: 90%  91%   Weight:  88.8 kg    Height:        Intake/Output Summary (Last 24 hours) at 01/12/2020 0824 Last data filed at 01/20/2020 0423 Gross per 24 hour  Intake 997.49 ml  Output 1150 ml  Net -152.51 ml   Last 3 Weights 01/01/2020 01/17/2020 01/16/2020  Weight (lbs) 195 lb 12.3 oz 198 lb 6.6 oz 196 lb 6.9 oz  Weight (kg) 88.8 kg 90 kg 89.1 kg      Telemetry    Rate controlled Afib - Personally  Reviewed  ECG    No new ECG - Personally Reviewed  Physical Exam   GEN: Laying in bed, dyspneic, HFNC in place Neck: Difficult to assess due to body habitus Cardiac: Irregular, no murmurs Respiratory: Tachypneic with expiratory wheezing. Crackles at bases GI: Obese, soft, NTTP MS: No edema; No deformity. Neuro:  Nonfocal  Psych: Normal affect   Labs    High Sensitivity Troponin:   Recent Labs  Lab 02/03/20 1722 02-03-2020 2025 01/11/20 0127 01/11/20 1717  TROPONINIHS 11 10 12  18*      Chemistry Recent Labs  Lab 01/17/20 0113 01/17/20 0113 01/17/20 1449 01/17/20 2058 01/04/2020 0032  NA 141   < > 139 142 142  K 3.6   < > 3.3* 3.0* 4.0  CL 93*   < > 95* 94* 95*  CO2 36*   < > 35* 39* 36*  GLUCOSE 161*   < > 271* 169* 173*  BUN 51*   < > 48* 48* 46*  CREATININE 1.29*   < > 1.28* 1.19* 1.19*  CALCIUM 10.0   < > 9.8 9.8 9.8  PROT 6.4*  --   --  6.5  --  ALBUMIN 3.7  --   --  4.1  --   AST 29  --   --  27  --   ALT 18  --   --  20  --   ALKPHOS 41  --   --  42  --   BILITOT 0.7  --   --  0.9  --   GFRNONAA 43*   < > 43* 47* 47*  ANIONGAP 12   < > 9 9 11    < > = values in this interval not displayed.     Hematology Recent Labs  Lab 01/17/20 2058 01/04/2020 0032  WBC 12.3* 12.3*  RBC 3.83* 3.76*  HGB 9.7* 9.6*  HCT 34.7* 33.7*  MCV 90.6 89.6  MCH 25.3* 25.5*  MCHC 28.0* 28.5*  RDW 15.0 15.0  PLT 117* 114*    BNPNo results for input(s): BNP, PROBNP in the last 168 hours.   DDimer No results for input(s): DDIMER in the last 168 hours.   Radiology    DG Chest Port 1 View  Result Date: 01/14/2020 CLINICAL DATA:  74 year old female with possible sepsis. EXAM: PORTABLE CHEST 1 VIEW COMPARISON:  Portable chest 01/15/2020 and earlier. Chest CT 01/13/2020. FINDINGS: Portable AP semi upright view at 2308 hours. Chronic cardiomegaly. Mildly rotated to the right. Stable left chest cardiac pacemaker. Sequelae of CABG. No pneumothorax or pulmonary edema. Chronic  hypo ventilation at the right lung base. Small bilateral pleural effusions were better demonstrated on the recent CT. No areas of worsening ventilation. Paucity of bowel gas in the upper abdomen. No acute osseous abnormality identified. IMPRESSION: 1. Cardiomegaly. Small pleural effusions better demonstrated on recent CT. 2. No new cardiopulmonary abnormality. Electronically Signed   By: 01/15/2020 M.D.   On: 01/25/2020 03:50    Cardiac Studies   Echo 01/10/20 1. Left ventricular ejection fraction, by estimation, is 55 to 60%. The  left ventricle has normal function. The left ventricle has no regional  wall motion abnormalities. Left ventricular diastolic parameters are  indeterminate.  2. Right ventricular systolic function is normal. The right ventricular  size is normal.  3. Left atrial size was mildly dilated.  4. The mitral valve is normal in structure. Mild mitral valve  regurgitation. No evidence of mitral stenosis. Severe mitral annular  calcification.  5. The aortic valve is normal in structure. Aortic valve regurgitation is  not visualized. No aortic stenosis is present.  6. The inferior vena cava is dilated in size with <50% respiratory  variability, suggesting right atrial pressure of 15 mmHg.   Pacemaker download 01/14/2020: Normal device function. In AFib persistently since November 21, 2019 (overall burden 41% in last 12 months). Occasional atrial undersensing of fib waves. Throughout most of this period, ventricular rate control has been fair, except for last 1-2 weeks. RV pacing only 14%.  Patient Profile     74 y.o. female with history of COPD on home O2, persistent Afib, HTN, CKDIII, history of GIB, and chronic diastolic heart failure who presented with acute on chronic respiratory failure thought to be multifactorial in the setting of COPD and acute on chronic diastolic heart failure exacerbationfor which we are now consulted. Remains on high O2  requirements.  Assessment & Plan    #Acute on chronic diastolic heart failure: NYHA class III symptoms. Currently decompensated with bilateral effusions and pulmonary congestion on CXR on 11/15. Wt improved overnight with IV lasix and diamox. Re-establishing new dry weight  -Lasix 60mg  IV BID -Trial  metolazone 5mg  once today; follow-up I/Os -Will need to re-establish new dry weight (<196lbs) -Trend Cr closely with diuresis -Monitor I/Os and daily weights  #Persistent Afib #SSS s/p PPM placement: Last PPM interrogation revealed that patient in uninterrupted Afib since 11/2019 with fairly well controlled rates until days prior to admission. Now failure well rate controlled -Not on AC due to history of GIB and therefore not candidate for DCCV -Continue dilt and metop for rate control  #CAD s/p ACB: No evidence of active ischemia. TTE with preserved EF and no significant WMA. -Continue statin -Not on ASA due to GIB  #Acute on Chronic respiratory Failure #COPD Remains with high O2 requirements. On intermittent BiPAP and HFNC. Appear dyspneic this AM. -Place back on BiPAP given worsening dyspnea -On steroids and nebs for COPD exacerbation -Management per primary team and pulm   For questions or updates, please contact [facility] Please consult [email or web address] for contact info under        Signed, 12/2019, MD  01/14/2020, 8:24 AM

## 2020-01-31 NOTE — Progress Notes (Signed)
RT note- Code blue on 2C-04, responded and with BMV/CPR in process. Assumed ventilations and suctioned oral cavity, anesthesia at bedside and assisted with intubation. BBS equal, bloody secretions suctioned post ETT placement. End tidal co2 placed. ROSC obtained, and transferred to 2H-18 and placed on ventilator.

## 2020-01-31 NOTE — Progress Notes (Signed)
Central Venous Catheter Insertion Procedure Note Gabrielle [name] [id number] 1946/01/26  Procedure: Insertion of Central Venous Catheter Indications: Drug and/or fluid administration and hypotensive post cardiac arrest  Procedure Details Consent: Unable to obtain consent because of emergent medical necessity. Time Out: Verified patient identification, verified procedure, site/side was marked, verified correct patient position, special equipment/implants available, medications/allergies/relevent history reviewed, required imaging and test results available.  Performed  Maximum sterile technique was used including antiseptics, cap, gloves, gown, hand hygiene, mask and sheet. Skin prep: Chlorhexidine; local anesthetic administered A antimicrobial bonded/coated triple lumen catheter was placed in the right femoral vein due to emergent situation using the Seldinger technique.  Evaluation Blood flow good Complications: No apparent complications Patient did tolerate procedure well.  Gabrielle [name] 01/07/2020, 12:20 PM  Procedure performed with Rutherford Guys PA-C

## 2020-01-31 NOTE — Progress Notes (Addendum)
Palliative:  I came to consult with patient and family. Notified by RN and PCCM that they have already discussed plan for one way extubation to comfort care. No palliative needs.   No charge  Yong Channel, NP Palliative Medicine Team Pager [phone] (Please see [email or web address] for schedule) Team Phone [phone]

## 2020-01-31 NOTE — Procedures (Signed)
Arterial Catheter Insertion Procedure Note  Gabrielle [name]  [id number]  November 04, 1945  Date:01/15/2020  Time:12:06 PM    Provider Performing: Rutherford Guys    Procedure: Insertion of Arterial Line (10175) without US guidance  Indication(s) Blood pressure monitoring and/or need for frequent ABGs  Consent Unable to obtain consent due to emergent nature of procedure.  Anesthesia None   Time Out Verified patient identification, verified procedure, site/side was marked, verified correct patient position, special equipment/implants available, medications/allergies/relevant history reviewed, required imaging and test results available.   Sterile Technique Maximal sterile technique including full sterile barrier drape, hand hygiene, sterile gown, sterile gloves, mask, hair covering, sterile ultrasound probe cover (if used).   Procedure Description Area of catheter insertion was cleaned with chlorhexidine and draped in sterile fashion. Without real-time ultrasound guidance an arterial catheter was placed into the right femoral artery.  Appropriate arterial tracings confirmed on monitor.     Complications/Tolerance None; patient tolerated the procedure well.   EBL Minimal   Specimen(s) None \ Rutherford Guys, [place] [facility] 01/14/2020, 12:06 PM

## 2020-01-31 NOTE — Progress Notes (Signed)
Patient terminally extubated by RT via MD orders. Magnet placed over patient's pacemaker post extubation. At 1530, patient was noted to have no breath sounds or heart sounds. Verified with second RN, see post mortem documentation sheet.

## 2020-01-31 NOTE — Progress Notes (Signed)
This chaplain was pastorally present with the Pt. family in the waiting area.  The chaplain listened as multiple generations of family reflected on the Pt. Story. The family is hopeful the Pt. is no longer suffering.   The chaplain accepted the invitation for prayer and offered F/U spiritual care as needed.

## 2020-01-31 NOTE — Progress Notes (Signed)
   01/01/2020 1030  Clinical Encounter Type  Visited With Patient not available  Visit Type Code  Referral From Nurse  Consult/Referral To Chaplain  Chaplain responded to code. The patient's family was not present. Please contact Spiritual Care team if patient or family needs our support. This note was prepared by Deneen Harts, M.Div..  For questions please contact by phone [phone].

## 2020-01-31 DEATH — deceased

## 2020-11-21 IMAGING — DX PORTABLE CHEST - 1 VIEW
1 series · 1 of 1 positions shown · non-contrast
Comparison: 08/19/2016 chest radiograph.

CLINICAL DATA: Dyspnea

EXAM:
PORTABLE CHEST 1 VIEW

[chest ap]
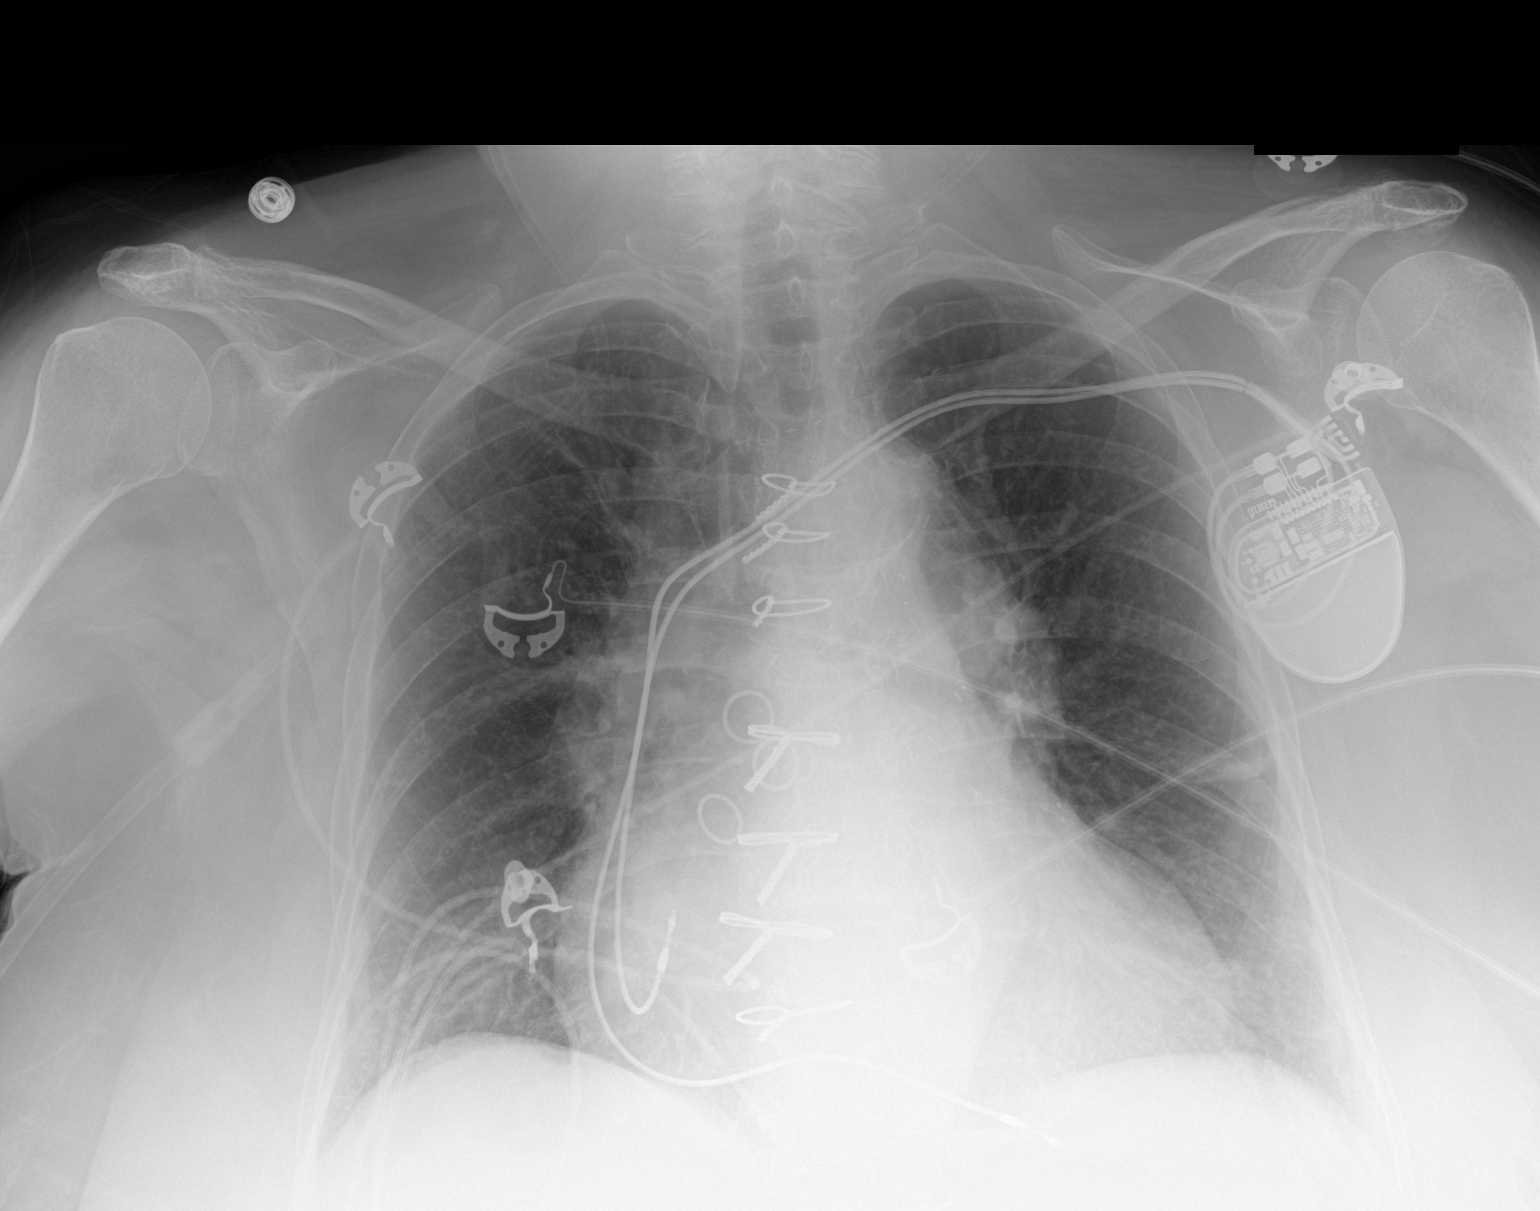

[1 of 1 positions shown; findings below may reference images not displayed]

FINDINGS: Stable configuration of intact sternotomy wires and 2 lead left
subclavian pacemaker. Stable cardiomediastinal silhouette with mild
cardiomegaly. No pneumothorax. No pleural effusion. No pulmonary
edema. Stable mild scarring versus atelectasis in the left mid lung.
No acute consolidative airspace disease.
IMPRESSION: Stable mild cardiomegaly without overt pulmonary edema. No active
pulmonary disease.
